# Patient Record
Sex: Female | Born: 1945
Health system: Southern US, Community
[De-identification: ages and names within clinical notes are randomized; demographics above are authoritative.]

## PROBLEM LIST (undated history)

## (undated) DIAGNOSIS — M419 Scoliosis, unspecified: Secondary | ICD-10-CM

## (undated) DIAGNOSIS — E119 Type 2 diabetes mellitus without complications: Secondary | ICD-10-CM

## (undated) DIAGNOSIS — N809 Endometriosis, unspecified: Secondary | ICD-10-CM

## (undated) DIAGNOSIS — I10 Essential (primary) hypertension: Secondary | ICD-10-CM

## (undated) DIAGNOSIS — Z794 Long term (current) use of insulin: Secondary | ICD-10-CM

## (undated) DIAGNOSIS — N189 Chronic kidney disease, unspecified: Secondary | ICD-10-CM

## (undated) DIAGNOSIS — K219 Gastro-esophageal reflux disease without esophagitis: Secondary | ICD-10-CM

## (undated) DIAGNOSIS — B1920 Unspecified viral hepatitis C without hepatic coma: Secondary | ICD-10-CM

## (undated) DIAGNOSIS — F32A Depression, unspecified: Secondary | ICD-10-CM

## (undated) DIAGNOSIS — R0600 Dyspnea, unspecified: Secondary | ICD-10-CM

## (undated) DIAGNOSIS — M1990 Unspecified osteoarthritis, unspecified site: Secondary | ICD-10-CM

## (undated) DIAGNOSIS — Z6841 Body Mass Index (BMI) 40.0 and over, adult: Secondary | ICD-10-CM

## (undated) DIAGNOSIS — E039 Hypothyroidism, unspecified: Secondary | ICD-10-CM

## (undated) HISTORY — DX: Morbid (severe) obesity due to excess calories: E66.01

## (undated) HISTORY — DX: Body Mass Index (BMI) 40.0 and over, adult: Z684

## (undated) HISTORY — PX: DIAGNOSTIC LAPAROSCOPY: SUR761

## (undated) HISTORY — PX: ABDOMINAL HYSTERECTOMY: SHX81

## (undated) HISTORY — PX: DILATION AND CURETTAGE OF UTERUS: SHX78

## (undated) HISTORY — DX: Type 2 diabetes mellitus without complications: Z79.4

## (undated) HISTORY — DX: Type 2 diabetes mellitus without complications: E11.9

## (undated) HISTORY — DX: Essential (primary) hypertension: I10

## (undated) HISTORY — PX: CHOLECYSTECTOMY: SHX55

## (undated) HISTORY — PX: LAPAROSCOPIC GASTRIC BANDING: SHX1100

## (undated) HISTORY — PX: EYE SURGERY: SHX253

## (undated) HISTORY — PX: JOINT REPLACEMENT: SHX530

## (undated) HISTORY — PX: TONSILLECTOMY: SUR1361

---

## 2011-07-28 ENCOUNTER — Ambulatory Visit: Payer: Self-pay | Admitting: Family Medicine

## 2011-12-25 ENCOUNTER — Encounter: Payer: Self-pay | Admitting: Obstetrics and Gynecology

## 2012-02-28 ENCOUNTER — Emergency Department (HOSPITAL_COMMUNITY)
Admission: EM | Admit: 2012-02-28 | Discharge: 2012-02-28 | Disposition: A | Discharge status: Home / Self Care | Payer: Managed Care, Other (non HMO) | Attending: Emergency Medicine | Admitting: Emergency Medicine

## 2012-02-28 ENCOUNTER — Encounter (HOSPITAL_COMMUNITY): Payer: Self-pay | Admitting: Emergency Medicine

## 2012-02-28 ENCOUNTER — Emergency Department (HOSPITAL_COMMUNITY): Payer: Managed Care, Other (non HMO)

## 2012-02-28 DIAGNOSIS — M542 Cervicalgia: Secondary | ICD-10-CM | POA: Insufficient documentation

## 2012-02-28 DIAGNOSIS — Z794 Long term (current) use of insulin: Secondary | ICD-10-CM | POA: Insufficient documentation

## 2012-02-28 DIAGNOSIS — Z791 Long term (current) use of non-steroidal anti-inflammatories (NSAID): Secondary | ICD-10-CM | POA: Insufficient documentation

## 2012-02-28 DIAGNOSIS — E119 Type 2 diabetes mellitus without complications: Secondary | ICD-10-CM | POA: Insufficient documentation

## 2012-02-28 DIAGNOSIS — Z79899 Other long term (current) drug therapy: Secondary | ICD-10-CM | POA: Insufficient documentation

## 2012-02-28 DIAGNOSIS — M129 Arthropathy, unspecified: Secondary | ICD-10-CM | POA: Insufficient documentation

## 2012-02-28 DIAGNOSIS — M5412 Radiculopathy, cervical region: Secondary | ICD-10-CM

## 2012-02-28 DIAGNOSIS — I10 Essential (primary) hypertension: Secondary | ICD-10-CM | POA: Insufficient documentation

## 2012-02-28 HISTORY — DX: Unspecified osteoarthritis, unspecified site: M19.90

## 2012-02-28 MED ORDER — PREDNISONE 10 MG PO TABS: ORAL_TABLET | ORAL | Status: DC

## 2012-02-28 MED ORDER — HYDROCODONE-ACETAMINOPHEN 5-325 MG PO TABS: 1.0000 | ORAL_TABLET | ORAL | Status: DC | PRN

## 2012-02-28 MED ORDER — HYDROCODONE-ACETAMINOPHEN 5-325 MG PO TABS
1.0000 | ORAL_TABLET | Freq: Once | ORAL | Status: AC
  Administered 2012-02-28: 1 via ORAL
  Filled 2012-02-28: qty 1

## 2012-02-28 NOTE — ED Notes (Signed)
Patient advises that the pian is in the back of her head and radiates into the neck shoulder and down into the upper arm.

## 2012-02-28 NOTE — ED Notes (Signed)
Complaints of headache pain,radiates into the neck and the left arm. Sharp in nature, pain times two days. History of the same approximately 6 weeks ago.

## 2012-02-28 NOTE — ED Notes (Signed)
Patient transported to CT 

## 2012-02-28 NOTE — ED Notes (Signed)
Patient discharged with instructions using the teach back method she verbalizes an understanding.

## 2012-02-28 NOTE — ED Provider Notes (Signed)
History     CSN: [id number]  Arrival date & time 02/28/12  1308   First MD Initiated Contact with Patient 02/28/12 [phone]      Chief Complaint  Patient presents with  . Headache    (Consider location/radiation/quality/duration/timing/severity/associated sxs/prior treatment) HPI Comments: The patient is a 67 year old woman who says that for about 6 weeks she's been having pain in the left occipital region that goes into her left neck and left shoulder and left upper arm. She saw her orthopedist who x-rayed her left shoulder and didn't find anything. She has been taking Aleve, without relief. In the past couple of nights the pain has been severe enough that she cried. She therefore sought evaluation. There is no history of injury to her head or neck.  Patient is a 67 y.o. female presenting with headaches. The history is provided by the patient.  Headache  This is a new problem. The current episode started more than 1 week ago (Onset 6 weeks ago.). Episode frequency: Intermittent pain that originates in the left occipital region. The problem has been gradually worsening. The headache is associated with nothing. The pain is located in the left unilateral and occipital region. The quality of the pain is described as sharp. The pain is at a severity of 8/10. The pain is severe. The pain radiates to the left arm, left shoulder and left neck. She has tried NSAIDs for the symptoms. The treatment provided no relief.    Past Medical History  Diagnosis Date  . Arthritis   . Diabetes mellitus without complication   . Hypertension     Past Surgical History  Procedure Date  . Tonsillectomy   . Joint replacement   . Abdominal hysterectomy     No family history on file.  History  Substance Use Topics  . Smoking status: Not on file  . Smokeless tobacco: Not on file  . Alcohol Use:     OB History    Grav Para Term Preterm Abortions TAB SAB Ect Mult Living                  Review of  Systems  HENT: Positive for neck pain.   Eyes: Negative.   Respiratory: Negative.   Cardiovascular: Negative.   Gastrointestinal: Negative.   Genitourinary: Negative.   Musculoskeletal:       Arthritis in hips requiring steroid injections in the past.  Skin: Negative.   Neurological: Positive for headaches.  Psychiatric/Behavioral: Negative.     Allergies  Review of patient's allergies indicates no known allergies.  Home Medications   Current Outpatient Rx  Name  Route  Sig  Dispense  Refill  . ATENOLOL 50 MG PO TABS   Oral   Take 50 mg by mouth daily.         . INSULIN ASPART PROT & ASPART (70-30) 100 UNIT/ML [place] SUSP   Subcutaneous   Inject 40 Units into the skin 2 (two) times daily with a meal.         . MULTI-VITAMIN/MINERALS PO TABS   Oral   Take 1 tablet by mouth daily.         [name] NAPROXEN SODIUM 220 MG PO TABS   Oral   Take 220 mg by mouth 2 (two) times daily with a meal.         . SITAGLIPTIN PHOSPHATE 25 MG PO TABS   Oral   Take 25 mg by mouth daily.         [name]  VALSARTAN 160 MG PO TABS   Oral   Take 160 mg by mouth daily.           BP 181/70  Pulse 57  Temp 98.4 F (36.9 C) (Oral)  Resp 14  SpO2 98%  Physical Exam  Nursing note and vitals reviewed. Constitutional: She is oriented to person, place, and time.       Obese elderly lady in moderate distress with pain originates in her left occipital region and radiates into her left shoulder and left upper arm.  HENT:  Head: Normocephalic and atraumatic.  Right Ear: External ear normal.  Left Ear: External ear normal.  Mouth/Throat: Oropharynx is clear and moist.  Eyes: Conjunctivae normal and EOM are normal. Pupils are equal, round, and reactive to light.  Neck: Normal range of motion. Neck supple.  Cardiovascular: Normal rate, regular rhythm and normal heart sounds.   Pulmonary/Chest: Effort normal and breath sounds normal.  Abdominal: Soft. Bowel sounds are normal.  Musculoskeletal:  Normal range of motion.  Neurological: She is alert and oriented to person, place, and time.       No sensory or motor deficit.  Skin: Skin is warm and dry.  Psychiatric: She has a normal mood and affect. Her behavior is normal.    ED Course  Procedures (including critical care time)  9:38 AM Pt seen --> physical exam performed.  PO pain medicine ordered.  CT of head and cervical spine ordered.  12:15 PM No results found for this or any previous visit. Ct Head Wo Contrast  02/28/2012  *RADIOLOGY REPORT*  Clinical Data:  Left occipital headache for 6 weeks, pain radiating to the left neck and left arm  CT HEAD WITHOUT CONTRAST CT CERVICAL SPINE WITHOUT CONTRAST  Technique:  Multidetector CT imaging of the head and cervical spine was performed following the standard protocol without intravenous contrast.  Multiplanar CT image reconstructions of the cervical spine were also generated.  Comparison:   None  CT HEAD  Findings: No acute hemorrhage, acute infarction, or mass lesion is identified.  No midline shift.  No ventriculomegaly.  No skull fracture.  Orbits and paranasal sinuses are intact.  IMPRESSION: No acute intracranial finding.  CT CERVICAL SPINE  Findings: C1 through the cervical thoracic junction is visualized in its entirety.  Mild reversal of the normal cervical lordosis is noted centered at C5-C6.  Mild disc degenerative changes are noted at that level.  No narrowing of the neural foramina is identified. Mild multilevel mid/inferior cervical spine facet osteoarthritic change, right greater than left.  Vertebral body heights are preserved.  No fracture or dislocation identified.  IMPRESSION: No acute osseous abnormality.  Mild disc degenerative change at C5- C6.   Original Report Authenticated By: Christiana Pellant, M.D.    Ct Cervical Spine Wo Contrast  02/28/2012  *RADIOLOGY REPORT*  Clinical Data:  Left occipital headache for 6 weeks, pain radiating to the left neck and left arm  CT HEAD  WITHOUT CONTRAST CT CERVICAL SPINE WITHOUT CONTRAST  Technique:  Multidetector CT imaging of the head and cervical spine was performed following the standard protocol without intravenous contrast.  Multiplanar CT image reconstructions of the cervical spine were also generated.  Comparison:   None  CT HEAD  Findings: No acute hemorrhage, acute infarction, or mass lesion is identified.  No midline shift.  No ventriculomegaly.  No skull fracture.  Orbits and paranasal sinuses are intact.  IMPRESSION: No acute intracranial finding.  CT CERVICAL SPINE  Findings:  C1 through the cervical thoracic junction is visualized in its entirety.  Mild reversal of the normal cervical lordosis is noted centered at C5-C6.  Mild disc degenerative changes are noted at that level.  No narrowing of the neural foramina is identified. Mild multilevel mid/inferior cervical spine facet osteoarthritic change, right greater than left.  Vertebral body heights are preserved.  No fracture or dislocation identified.  IMPRESSION: No acute osseous abnormality.  Mild disc degenerative change at C5- C6.   Original Report Authenticated By: Christiana Pellant, M.D.     CT of C-spine shows mild cervical disc disease.  Will treat for cervical radiculopathy with prednisone taper and hydrocodone-acetaminophen.  Advised to keep close check on her diabetes, as the prednisone could adversely affect her blood glucose levels.   1. Cervical radiculopathy           Carleene Cooper III, MD 02/28/12 [phone]

## 2012-03-08 ENCOUNTER — Other Ambulatory Visit (HOSPITAL_COMMUNITY): Payer: Self-pay | Admitting: Internal Medicine

## 2012-03-08 DIAGNOSIS — Z1231 Encounter for screening mammogram for malignant neoplasm of breast: Secondary | ICD-10-CM

## 2012-03-18 ENCOUNTER — Ambulatory Visit (HOSPITAL_COMMUNITY): Payer: Managed Care, Other (non HMO)

## 2012-03-23 ENCOUNTER — Ambulatory Visit (HOSPITAL_COMMUNITY)
Admission: RE | Admit: 2012-03-23 | Discharge: 2012-03-23 | Disposition: A | Discharge status: Home / Self Care | Payer: Managed Care, Other (non HMO) | Source: Ambulatory Visit | Attending: Internal Medicine | Admitting: Internal Medicine

## 2012-03-23 DIAGNOSIS — Z1231 Encounter for screening mammogram for malignant neoplasm of breast: Secondary | ICD-10-CM | POA: Insufficient documentation

## 2012-05-01 ENCOUNTER — Encounter (HOSPITAL_COMMUNITY): Payer: Self-pay | Admitting: Emergency Medicine

## 2012-05-01 ENCOUNTER — Emergency Department (HOSPITAL_COMMUNITY)
Admission: EM | Admit: 2012-05-01 | Discharge: 2012-05-01 | Disposition: A | Discharge status: Home / Self Care | Payer: Medicare HMO | Attending: Emergency Medicine | Admitting: Emergency Medicine

## 2012-05-01 ENCOUNTER — Emergency Department (HOSPITAL_COMMUNITY): Payer: Medicare HMO

## 2012-05-01 DIAGNOSIS — Y9389 Activity, other specified: Secondary | ICD-10-CM | POA: Insufficient documentation

## 2012-05-01 DIAGNOSIS — R51 Headache: Secondary | ICD-10-CM | POA: Insufficient documentation

## 2012-05-01 DIAGNOSIS — R0602 Shortness of breath: Secondary | ICD-10-CM | POA: Insufficient documentation

## 2012-05-01 DIAGNOSIS — I10 Essential (primary) hypertension: Secondary | ICD-10-CM | POA: Insufficient documentation

## 2012-05-01 DIAGNOSIS — Z79899 Other long term (current) drug therapy: Secondary | ICD-10-CM | POA: Insufficient documentation

## 2012-05-01 DIAGNOSIS — T59891A Toxic effect of other specified gases, fumes and vapors, accidental (unintentional), initial encounter: Secondary | ICD-10-CM | POA: Insufficient documentation

## 2012-05-01 DIAGNOSIS — R42 Dizziness and giddiness: Secondary | ICD-10-CM | POA: Insufficient documentation

## 2012-05-01 DIAGNOSIS — E1169 Type 2 diabetes mellitus with other specified complication: Secondary | ICD-10-CM | POA: Insufficient documentation

## 2012-05-01 DIAGNOSIS — E162 Hypoglycemia, unspecified: Secondary | ICD-10-CM

## 2012-05-01 DIAGNOSIS — Z7729 Contact with and (suspected ) exposure to other hazardous substances: Secondary | ICD-10-CM

## 2012-05-01 DIAGNOSIS — T5894XA Toxic effect of carbon monoxide from unspecified source, undetermined, initial encounter: Secondary | ICD-10-CM | POA: Insufficient documentation

## 2012-05-01 DIAGNOSIS — Y92009 Unspecified place in unspecified non-institutional (private) residence as the place of occurrence of the external cause: Secondary | ICD-10-CM | POA: Insufficient documentation

## 2012-05-01 DIAGNOSIS — R110 Nausea: Secondary | ICD-10-CM | POA: Insufficient documentation

## 2012-05-01 DIAGNOSIS — Z794 Long term (current) use of insulin: Secondary | ICD-10-CM | POA: Insufficient documentation

## 2012-05-01 LAB — TROPONIN I: Troponin I: <0.3 ng/mL (ref <0.30)

## 2012-05-01 LAB — COMPREHENSIVE METABOLIC PANEL
ALT: 27 U/L (ref 0–35)
AST: 40 U/L — ABNORMAL HIGH (ref 0–37)
Alkaline Phosphatase: 50 U/L (ref 39–117)
CO2: 26 mEq/L (ref 19–32)
Calcium: 9.4 mg/dL (ref 8.4–10.5)
Chloride: 104 mEq/L (ref 96–112)
GFR calc Af Amer: 57 mL/min — ABNORMAL LOW (ref >90)
GFR calc non Af Amer: 49 mL/min — ABNORMAL LOW (ref >90)
Glucose, Bld: 55 mg/dL — ABNORMAL LOW (ref 70–99)
Sodium: 140 mEq/L (ref 135–145)
Total Bilirubin: 0.3 mg/dL (ref 0.3–1.2)

## 2012-05-01 LAB — CBC WITH DIFFERENTIAL/PLATELET
Basophils Absolute: 0.1 10*3/uL (ref 0.0–0.1)
Basophils Relative: 1 % (ref 0–1)
Eosinophils Absolute: 0.3 10*3/uL (ref 0.0–0.7)
Hemoglobin: 12.5 g/dL (ref 12.0–15.0)
MCH: 29 pg (ref 26.0–34.0)
MCHC: 34.1 g/dL (ref 30.0–36.0)
Monocytes Relative: 9 % (ref 3–12)
Neutro Abs: 6.4 10*3/uL (ref 1.7–7.7)
Neutrophils Relative %: 61 % (ref 43–77)
Platelets: 177 10*3/uL (ref 150–400)
RDW: 15.2 % (ref 11.5–15.5)

## 2012-05-01 LAB — GLUCOSE, CAPILLARY
Glucose-Capillary: 65 mg/dL — ABNORMAL LOW (ref 70–99)
Glucose-Capillary: 164 mg/dL — ABNORMAL HIGH (ref 70–99)

## 2012-05-01 LAB — BLOOD GAS, ARTERIAL
Acid-Base Excess: 1 mmol/L (ref 0.0–2.0)
Bicarbonate: 25 mEq/L — ABNORMAL HIGH (ref 20.0–24.0)
O2 Saturation: 99.2 %
pCO2 arterial: 39.6 mmHg (ref 35.0–45.0)
pO2, Arterial: 341 mmHg — ABNORMAL HIGH (ref 80.0–100.0)

## 2012-05-01 LAB — CARBOXYHEMOGLOBIN
Methemoglobin: 1.6 % — ABNORMAL HIGH (ref 0.0–1.5)
Total hemoglobin: 13.2 g/dL (ref 12.0–16.0)

## 2012-05-01 LAB — LACTIC ACID, PLASMA: Lactic Acid, Venous: 2 mmol/L (ref 0.5–2.2)

## 2012-05-01 MED ORDER — IBUPROFEN 200 MG PO TABS
400.0000 mg | ORAL_TABLET | Freq: Once | ORAL | Status: AC
  Administered 2012-05-01: 400 mg via ORAL
  Filled 2012-05-01: qty 2

## 2012-05-01 MED ORDER — DEXTROSE 50 % IV SOLN
50.0000 mL | Freq: Once | INTRAVENOUS | Status: AC
  Administered 2012-05-01: 50 mL via INTRAVENOUS
  Filled 2012-05-01: qty 50
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
## 2012-05-01 NOTE — ED Notes (Signed)
MD at bedside. 
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
## 2012-05-01 NOTE — ED Notes (Signed)
[id number]<AV> Expected date:05/01/12<BR> Expected time: 9:41 AM<BR> Means of arrival:Ambulance<BR> Comments:<BR> Headache
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
## 2012-05-12 ENCOUNTER — Other Ambulatory Visit: Payer: Self-pay | Admitting: Physician Assistant

## 2012-05-12 DIAGNOSIS — B1920 Unspecified viral hepatitis C without hepatic coma: Secondary | ICD-10-CM

## 2012-05-19 ENCOUNTER — Ambulatory Visit
Admission: RE | Admit: 2012-05-19 | Discharge: 2012-05-19 | Disposition: A | Discharge status: Home / Self Care | Payer: Medicare HMO | Source: Ambulatory Visit | Attending: Physician Assistant | Admitting: Physician Assistant

## 2012-05-19 DIAGNOSIS — B1920 Unspecified viral hepatitis C without hepatic coma: Secondary | ICD-10-CM

## 2012-05-25 ENCOUNTER — Other Ambulatory Visit: Payer: Self-pay | Admitting: Physician Assistant

## 2012-05-25 DIAGNOSIS — K7460 Unspecified cirrhosis of liver: Secondary | ICD-10-CM

## 2012-05-25 DIAGNOSIS — C220 Liver cell carcinoma: Secondary | ICD-10-CM

## 2012-06-02 ENCOUNTER — Ambulatory Visit
Admission: RE | Admit: 2012-06-02 | Discharge: 2012-06-02 | Disposition: A | Discharge status: Home / Self Care | Payer: Medicare HMO | Source: Ambulatory Visit | Attending: Physician Assistant | Admitting: Physician Assistant

## 2012-06-02 DIAGNOSIS — C220 Liver cell carcinoma: Secondary | ICD-10-CM

## 2012-06-02 DIAGNOSIS — K7460 Unspecified cirrhosis of liver: Secondary | ICD-10-CM

## 2012-06-02 MED ORDER — IOHEXOL 300 MG/ML  SOLN
125.0000 mL | Freq: Once | INTRAMUSCULAR | Status: AC | PRN
  Administered 2012-06-02: 125 mL via INTRAVENOUS

## 2012-06-15 ENCOUNTER — Other Ambulatory Visit: Payer: Self-pay | Admitting: Internal Medicine

## 2012-06-15 DIAGNOSIS — K769 Liver disease, unspecified: Secondary | ICD-10-CM

## 2012-07-01 ENCOUNTER — Ambulatory Visit
Admission: RE | Admit: 2012-07-01 | Discharge: 2012-07-01 | Disposition: A | Discharge status: Home / Self Care | Payer: Medicare HMO | Source: Ambulatory Visit | Attending: Internal Medicine | Admitting: Internal Medicine

## 2012-07-01 DIAGNOSIS — K769 Liver disease, unspecified: Secondary | ICD-10-CM

## 2012-07-01 MED ORDER — GADOXETATE DISODIUM 0.25 MMOL/ML IV SOLN
10.0000 mL | Freq: Once | INTRAVENOUS | Status: AC | PRN
  Administered 2012-07-01: 10 mL via INTRAVENOUS

## 2012-07-02 ENCOUNTER — Other Ambulatory Visit: Payer: Medicare HMO

## 2012-09-30 ENCOUNTER — Other Ambulatory Visit: Payer: Self-pay | Admitting: Internal Medicine

## 2012-09-30 DIAGNOSIS — K769 Liver disease, unspecified: Secondary | ICD-10-CM

## 2012-10-14 ENCOUNTER — Emergency Department (HOSPITAL_COMMUNITY)
Admission: EM | Admit: 2012-10-14 | Discharge: 2012-10-14 | Disposition: A | Discharge status: Home / Self Care | Payer: Medicare HMO | Attending: Emergency Medicine | Admitting: Emergency Medicine

## 2012-10-14 ENCOUNTER — Encounter (HOSPITAL_COMMUNITY): Payer: Self-pay | Admitting: Emergency Medicine

## 2012-10-14 DIAGNOSIS — Z791 Long term (current) use of non-steroidal anti-inflammatories (NSAID): Secondary | ICD-10-CM | POA: Insufficient documentation

## 2012-10-14 DIAGNOSIS — E119 Type 2 diabetes mellitus without complications: Secondary | ICD-10-CM | POA: Insufficient documentation

## 2012-10-14 DIAGNOSIS — Z79899 Other long term (current) drug therapy: Secondary | ICD-10-CM | POA: Insufficient documentation

## 2012-10-14 DIAGNOSIS — N390 Urinary tract infection, site not specified: Secondary | ICD-10-CM | POA: Insufficient documentation

## 2012-10-14 DIAGNOSIS — Z794 Long term (current) use of insulin: Secondary | ICD-10-CM | POA: Insufficient documentation

## 2012-10-14 DIAGNOSIS — R5381 Other malaise: Secondary | ICD-10-CM | POA: Insufficient documentation

## 2012-10-14 DIAGNOSIS — R5383 Other fatigue: Secondary | ICD-10-CM | POA: Insufficient documentation

## 2012-10-14 DIAGNOSIS — I10 Essential (primary) hypertension: Secondary | ICD-10-CM | POA: Insufficient documentation

## 2012-10-14 DIAGNOSIS — M129 Arthropathy, unspecified: Secondary | ICD-10-CM | POA: Insufficient documentation

## 2012-10-14 DIAGNOSIS — IMO0001 Reserved for inherently not codable concepts without codable children: Secondary | ICD-10-CM | POA: Insufficient documentation

## 2012-10-14 HISTORY — DX: Unspecified viral hepatitis C without hepatic coma: B19.20

## 2012-10-14 LAB — CBC WITH DIFFERENTIAL/PLATELET
HCT: 36.5 % (ref 36.0–46.0)
Hemoglobin: 12.5 g/dL (ref 12.0–15.0)
Lymphocytes Relative: 30 % (ref 12–46)
MCHC: 34.2 g/dL (ref 30.0–36.0)
Monocytes Absolute: 0.5 10*3/uL (ref 0.1–1.0)
Monocytes Relative: 8 % (ref 3–12)
Neutro Abs: 3.8 10*3/uL (ref 1.7–7.7)
WBC: 6.6 10*3/uL (ref 4.0–10.5)

## 2012-10-14 LAB — COMPREHENSIVE METABOLIC PANEL
BUN: 26 mg/dL — ABNORMAL HIGH (ref 6–23)
CO2: 23 mEq/L (ref 19–32)
Chloride: 103 mEq/L (ref 96–112)
Creatinine, Ser: 1.04 mg/dL (ref 0.50–1.10)
GFR calc non Af Amer: 54 mL/min — ABNORMAL LOW (ref >90)
Total Bilirubin: 0.5 mg/dL (ref 0.3–1.2)

## 2012-10-14 LAB — URINE MICROSCOPIC-ADD ON

## 2012-10-14 LAB — URINALYSIS, ROUTINE W REFLEX MICROSCOPIC
Protein, ur: NEGATIVE mg/dL
Urobilinogen, UA: 0.2 mg/dL (ref 0.0–1.0)

## 2012-10-14 MED ORDER — NITROFURANTOIN MONOHYD MACRO 100 MG PO CAPS: 100.0000 mg | ORAL_CAPSULE | Freq: Two times a day (BID) | ORAL | Status: DC

## 2012-10-14 MED ORDER — SODIUM CHLORIDE 0.9 % IV BOLUS (SEPSIS)
1000.0000 mL | Freq: Once | INTRAVENOUS | Status: AC
  Administered 2012-10-14: 1000 mL via INTRAVENOUS

## 2012-10-14 NOTE — ED Provider Notes (Signed)
CSN: [id number]     Arrival date & time 10/14/12  0827 History     First MD Initiated Contact with Patient 10/14/12 [phone]     Chief Complaint  Patient presents with  . Influenza   (Consider location/radiation/quality/duration/timing/severity/associated sxs/prior Treatment) HPI Comments: Patient had 3-4 episodes of vomiting on Sunday that resolved Sunday evening. On Monday she thinks she felt okay but yesterday had diffuse myalgias, elevated blood sugar at 200 despite following a strict diabetic diet and dark urine. She drank lots of fluids and rested and feels a bit better today but is still having some body aches. She recently has had contact with multiple people who have had the flu and she just wanted to be careful and be checked. She denies any further vomiting or nausea. She is still taking all of her diabetic diet medications appropriately but when woke up this morning her blood sugar was 300.  Patient is a 67 y.o. female presenting with flu symptoms. The history is provided by the patient.  Influenza Presenting symptoms: fatigue, myalgias and vomiting   Severity:  Moderate Onset quality:  Gradual Duration:  4 days Progression:  Improving Chronicity:  New Relieved by:  Drinking and rest Worsened by:  Nothing tried Ineffective treatments:  None tried Associated symptoms: no chills, no decreased appetite, no decrease in physical activity, no mental status change, no congestion and no neck stiffness   Associated symptoms comment:  Intermittent loose stool and constipation.   Past Medical History  Diagnosis Date  . Arthritis   . Diabetes mellitus without complication   . Hypertension    Past Surgical History  Procedure Laterality Date  . Tonsillectomy    . Joint replacement    . Abdominal hysterectomy    . Cholecystectomy     History reviewed. No pertinent family history. History  Substance Use Topics  . Smoking status: Never Smoker   . Smokeless tobacco: Not on file  .  Alcohol Use: No   OB History   Grav Para Term Preterm Abortions TAB SAB Ect Mult Living                 Review of Systems  Constitutional: Positive for fatigue. Negative for chills and decreased appetite.  HENT: Negative for congestion and neck stiffness.   Gastrointestinal: Positive for vomiting.  Musculoskeletal: Positive for myalgias.  All other systems reviewed and are negative.    Allergies  Review of patient's allergies indicates no known allergies.  Home Medications   Current Outpatient Rx  Name  Route  Sig  Dispense  Refill  . bisacodyl (DULCOLAX) 5 MG EC tablet   Oral   Take 5 mg by mouth once as needed for constipation.         . naproxen sodium (ANAPROX) 220 MG tablet   Oral   Take 220 mg by mouth 2 (two) times daily with a meal.         . atenolol (TENORMIN) 50 MG tablet   Oral   Take 50 mg by mouth daily.         . insulin aspart protamine-insulin aspart (NOVOLOG 70/30) (70-30) 100 UNIT/ML injection   Subcutaneous   Inject 40 Units into the skin 2 (two) times daily with a meal.         . Multiple Vitamins-Minerals (MULTIVITAMIN WITH MINERALS) tablet   Oral   Take 1 tablet by mouth daily.         . sitaGLIPtin (JANUVIA) 25 MG tablet  Oral   Take 25 mg by mouth daily.         . valsartan (DIOVAN) 160 MG tablet   Oral   Take 160 mg by mouth daily.          BP 180/60  Pulse 52  SpO2 100% Physical Exam  Nursing note and vitals reviewed. Constitutional: She is oriented to person, place, and time. She appears well-developed and well-nourished. No distress.  HENT:  Head: Normocephalic and atraumatic.  Eyes: EOM are normal. Pupils are equal, round, and reactive to light.  Cardiovascular: Normal rate, regular rhythm, normal heart sounds and intact distal pulses.  Exam reveals no friction rub.   No murmur heard. Pulmonary/Chest: Effort normal and breath sounds normal. She has no wheezes. She has no rales.  Abdominal: Soft. Bowel sounds  are normal. She exhibits no distension. There is no tenderness. There is no rebound and no guarding.  Musculoskeletal: Normal range of motion. She exhibits no tenderness.  No edema  Neurological: She is alert and oriented to person, place, and time. No cranial nerve deficit.  Skin: Skin is warm and dry. No rash noted.  Psychiatric: She has a normal mood and affect. Her behavior is normal.    ED Course   Procedures (including critical care time)  Labs Reviewed  CBC WITH DIFFERENTIAL - Abnormal; Notable for the following:    Platelets 147 (*)    All other components within normal limits  COMPREHENSIVE METABOLIC PANEL - Abnormal; Notable for the following:    Glucose, Bld 282 (*)    BUN 26 (*)    Albumin 3.1 (*)    GFR calc non Af Amer 54 (*)    GFR calc Af Amer 63 (*)    All other components within normal limits  URINALYSIS, ROUTINE W REFLEX MICROSCOPIC - Abnormal; Notable for the following:    APPearance CLOUDY (*)    Glucose, UA 250 (*)    Hgb urine dipstick SMALL (*)    Nitrite POSITIVE (*)    Leukocytes, UA LARGE (*)    All other components within normal limits  URINE MICROSCOPIC-ADD ON - Abnormal; Notable for the following:    Squamous Epithelial / LPF FEW (*)    Bacteria, UA MANY (*)    All other components within normal limits  URINE CULTURE   No results found. 1. UTI (lower urinary tract infection)     MDM   Patient presenting with myalgias and elevated blood sugar for the last 2 days. Prior to that she had 12 hours of illness that resulted in 4 episodes of vomiting. She denies any recent medication changes and has been taking her diabetic meds appropriately. Also has recently had contact with others who have had similar symptoms.  She is feeling better today but wanted to be evaluated to ensure there was no other underlying cause for her elevated blood sugar. She has not had a flu shot this year yet.  She has normal vital signs and is well appearing on exam. There are  no focal signs of abnormalities. She denies any URI symptoms. Will ensure that patient does not have a urinary tract infection causing her symptoms. She denies any abdominal pain and has no signs of fluid overload. We'll treat her hyperglycemia as well.  CBC, CMP, UA pending and patient given 1 L bolus of fluid.  10:57 AM Pt has evidence of hyperglycemia which is most likely related to UTI today.  Will treat with abx and d/c home  as she is not displaying any signs of pyelo or AMS and tolerating po's.  Gwyneth Sprout, MD 10/14/12 1104

## 2012-10-16 ENCOUNTER — Telehealth (HOSPITAL_COMMUNITY): Payer: Self-pay | Admitting: Emergency Medicine

## 2012-10-16 LAB — URINE CULTURE

## 2012-10-16 NOTE — ED Notes (Signed)
Post ED Visit - Positive Culture Follow-up: Successful Patient Follow-Up  Culture assessed and recommendations reviewed by: []  Wes Dulaney, Pharm.D., BCPS []  Celedonio Miyamoto, Pharm.D., BCPS [x]  Georgina Pillion, Pharm.D., BCPS []  [place], [address].D., BCPS, AAHIVP []  Estella Husk, Pharm.D., BCPS, AAHIVP  Positive urine culture  []  Patient discharged without antimicrobial prescription and treatment is now indicated [x]  Organism is resistant to prescribed ED discharge antimicrobial []  Patient with positive blood cultures  Changes discussed with ED provider: Antony Madura PA-C New antibiotic prescription: Keflex 500 mg bid x 7 days    Teresa Golden 10/16/2012, 5:03 PM

## 2012-10-16 NOTE — Progress Notes (Signed)
ED Antimicrobial Stewardship Positive Culture Follow Up   Teresa Golden is an 66 y.o. female who presented to [facility] on 10/14/2012 with a chief complaint of N/V. Myalgia, hyperglycemia  Chief Complaint  Patient presents with  . Influenza    Recent Results (from the past 720 hour(s))  URINE CULTURE     Status: None   Collection Time    10/14/12 10:09 AM      Result Value Range Status   Specimen Description URINE, CLEAN CATCH   Final   Special Requests NONE   Final   Culture  Setup Time     Final   Value: 10/14/2012 15:00     Performed at [facility] Count     Final   Value: >=100,000 COLONIES/ML     Performed at [facility]   Culture     Final   Value: KLEBSIELLA PNEUMONIAE     Performed at [facility]   Report Status 10/16/2012 FINAL   Final   Organism ID, Bacteria KLEBSIELLA PNEUMONIAE   Final    [x]  Treated with Macrobid, organism intermediate to prescribed antimicrobial  67 y.o. F found to have a dirty UA and subsequent UTI upon work-up for N/V, myalgia and flu-like symptoms. The culture was intermediate to macrobid -- this coupled with the patient's reduced renal function due to advanced age requires a different antibiotic to treat this UTI.   New antibiotic prescription: Keflex 500 mg bid x 7 days  ED Provider: Antony Madura, PA-C  Teresa Golden 10/16/2012, 2:59 PM Infectious Diseases Pharmacist Phone# [phone]

## 2012-10-17 ENCOUNTER — Emergency Department (HOSPITAL_COMMUNITY): Payer: Medicare HMO

## 2012-10-17 ENCOUNTER — Emergency Department (HOSPITAL_COMMUNITY)
Admission: EM | Admit: 2012-10-17 | Discharge: 2012-10-17 | Disposition: A | Discharge status: Home / Self Care | Payer: Medicare HMO | Attending: Emergency Medicine | Admitting: Emergency Medicine

## 2012-10-17 ENCOUNTER — Encounter (HOSPITAL_COMMUNITY): Payer: Self-pay | Admitting: Emergency Medicine

## 2012-10-17 DIAGNOSIS — R0602 Shortness of breath: Secondary | ICD-10-CM | POA: Insufficient documentation

## 2012-10-17 DIAGNOSIS — Z9089 Acquired absence of other organs: Secondary | ICD-10-CM | POA: Insufficient documentation

## 2012-10-17 DIAGNOSIS — R52 Pain, unspecified: Secondary | ICD-10-CM | POA: Insufficient documentation

## 2012-10-17 DIAGNOSIS — R0989 Other specified symptoms and signs involving the circulatory and respiratory systems: Secondary | ICD-10-CM | POA: Insufficient documentation

## 2012-10-17 DIAGNOSIS — R10811 Right upper quadrant abdominal tenderness: Secondary | ICD-10-CM | POA: Insufficient documentation

## 2012-10-17 DIAGNOSIS — Z79899 Other long term (current) drug therapy: Secondary | ICD-10-CM | POA: Insufficient documentation

## 2012-10-17 DIAGNOSIS — E119 Type 2 diabetes mellitus without complications: Secondary | ICD-10-CM | POA: Insufficient documentation

## 2012-10-17 DIAGNOSIS — R110 Nausea: Secondary | ICD-10-CM | POA: Insufficient documentation

## 2012-10-17 DIAGNOSIS — I10 Essential (primary) hypertension: Secondary | ICD-10-CM | POA: Insufficient documentation

## 2012-10-17 DIAGNOSIS — R509 Fever, unspecified: Secondary | ICD-10-CM | POA: Insufficient documentation

## 2012-10-17 DIAGNOSIS — Z8619 Personal history of other infectious and parasitic diseases: Secondary | ICD-10-CM | POA: Insufficient documentation

## 2012-10-17 DIAGNOSIS — R0609 Other forms of dyspnea: Secondary | ICD-10-CM | POA: Insufficient documentation

## 2012-10-17 DIAGNOSIS — N390 Urinary tract infection, site not specified: Secondary | ICD-10-CM | POA: Insufficient documentation

## 2012-10-17 DIAGNOSIS — Z90710 Acquired absence of both cervix and uterus: Secondary | ICD-10-CM | POA: Insufficient documentation

## 2012-10-17 DIAGNOSIS — M129 Arthropathy, unspecified: Secondary | ICD-10-CM | POA: Insufficient documentation

## 2012-10-17 DIAGNOSIS — Z794 Long term (current) use of insulin: Secondary | ICD-10-CM | POA: Insufficient documentation

## 2012-10-17 LAB — CBC WITH DIFFERENTIAL/PLATELET
Basophils Absolute: 0 10*3/uL (ref 0.0–0.1)
Basophils Relative: 1 % (ref 0–1)
Eosinophils Absolute: 0.2 10*3/uL (ref 0.0–0.7)
Eosinophils Relative: 3 % (ref 0–5)
HCT: 35.5 % — ABNORMAL LOW (ref 36.0–46.0)
MCH: 29.8 pg (ref 26.0–34.0)
MCHC: 35.8 g/dL (ref 30.0–36.0)
Monocytes Absolute: 0.7 10*3/uL (ref 0.1–1.0)
Neutro Abs: 4.1 10*3/uL (ref 1.7–7.7)
RDW: 14.3 % (ref 11.5–15.5)

## 2012-10-17 LAB — COMPREHENSIVE METABOLIC PANEL
AST: 43 U/L — ABNORMAL HIGH (ref 0–37)
Albumin: 3.4 g/dL — ABNORMAL LOW (ref 3.5–5.2)
Calcium: 9.7 mg/dL (ref 8.4–10.5)
Chloride: 103 mEq/L (ref 96–112)
Creatinine, Ser: 0.87 mg/dL (ref 0.50–1.10)
Total Protein: 7.4 g/dL (ref 6.0–8.3)

## 2012-10-17 MED ORDER — DEXTROSE 5 % IV SOLN
1.0000 g | Freq: Once | INTRAVENOUS | Status: AC
  Administered 2012-10-17: 1 g via INTRAVENOUS
  Filled 2012-10-17: qty 10

## 2012-10-17 MED ORDER — CEPHALEXIN 500 MG PO CAPS: 500.0000 mg | ORAL_CAPSULE | Freq: Four times a day (QID) | ORAL | Status: DC

## 2012-10-17 MED ORDER — ONDANSETRON HCL 4 MG/2ML IJ SOLN
4.0000 mg | Freq: Once | INTRAMUSCULAR | Status: AC
  Administered 2012-10-17: 4 mg via INTRAVENOUS
  Filled 2012-10-17: qty 2

## 2012-10-17 NOTE — ED Provider Notes (Signed)
CSN: [id number]     Arrival date & time 10/17/12  0818 History     First MD Initiated Contact with Patient 10/17/12 [phone]     Chief Complaint  Patient presents with  . Shortness of Breath  . Fever  . Generalized Body Aches  . Urinary Tract Infection   (Consider location/radiation/quality/duration/timing/severity/associated sxs/prior Treatment) HPI This is a 67 year old female who was seen here 2 days ago and diagnosed with a urinary tract infection. She was started on Macrodantin. She presents today stating that she is having nausea and chills. She states that she was told to return if she had any other symptoms. She has taken 4 doses of the Macrodantin. She has not had any vomiting or fever. She states that she did not originally have urinary tract infection symptoms but just felt generally achy with malaise   This has improved some.  She normally has dyspnea with exertion. She states today she feels that she has some dyspnea at rest. She denies any chest pain. She has noted a little bit of increased swelling in her legs bilaterally. She denies any history of DVT or pulmonary embolism. She has been eating as usual and doing activities as usual. Review of her medical records reveals that she had urinalysis with culture which grew out Klebsiella which is susceptible to multiple antibiotics but only intermediately susceptible to Macrodantin. Past Medical History  Diagnosis Date  . Arthritis   . Diabetes mellitus without complication   . Hypertension   . Hepatitis C    Past Surgical History  Procedure Laterality Date  . Tonsillectomy    . Joint replacement    . Abdominal hysterectomy    . Cholecystectomy     No family history on file. History  Substance Use Topics  . Smoking status: Never Smoker   . Smokeless tobacco: Not on file  . Alcohol Use: No   OB History   Grav Para Term Preterm Abortions TAB SAB Ect Mult Living                 Review of Systems  All other systems  reviewed and are negative.    Allergies  Review of patient's allergies indicates no known allergies.  Home Medications   Current Outpatient Rx  Name  Route  Sig  Dispense  Refill  . atenolol (TENORMIN) 50 MG tablet   Oral   Take 50 mg by mouth at bedtime.          . bisacodyl (DULCOLAX) 5 MG EC tablet   Oral   Take 5 mg by mouth once as needed for constipation.         . insulin aspart protamine-insulin aspart (NOVOLOG 70/30) (70-30) 100 UNIT/ML injection   Subcutaneous   Inject 30-45 Units into the skin 2 (two) times daily with a meal. Depending on CGB         . losartan (COZAAR) 100 MG tablet   Oral   Take 100 mg by mouth at bedtime.         . Multiple Vitamins-Minerals (MULTIVITAMIN WITH MINERALS) tablet   Oral   Take 2 tablets by mouth daily.          . naproxen sodium (ANAPROX) 220 MG tablet   Oral   Take 440 mg by mouth daily as needed (pain).          . nitrofurantoin, macrocrystal-monohydrate, (MACROBID) 100 MG capsule   Oral   Take 100 mg by mouth 2 (two) times  daily.         . sitaGLIPtin (JANUVIA) 100 MG tablet   Oral   Take 100 mg by mouth daily at 12 noon.          BP 178/74  Pulse 58  Temp(Src) 97.4 F (36.3 C) (Oral)  Resp 20  SpO2 95% Physical Exam  Nursing note and vitals reviewed. Constitutional: She is oriented to person, place, and time. She appears well-developed and well-nourished.  Morbidly obese  HENT:  Head: Normocephalic and atraumatic.  Right Ear: External ear normal.  Left Ear: External ear normal.  Nose: Nose normal.  Mouth/Throat: Oropharynx is clear and moist.  Eyes: Conjunctivae and EOM are normal. Pupils are equal, round, and reactive to light.  Neck: Normal range of motion. Neck supple.  Cardiovascular: Normal rate, regular rhythm, normal heart sounds and intact distal pulses.   Pulmonary/Chest: Effort normal and breath sounds normal.  Abdominal: Soft. She exhibits no distension and no mass. There is no  rebound and no guarding.  Mild right upper quadrant tenderness  Musculoskeletal: Normal range of motion. She exhibits no edema and no tenderness.  Neurological: She is alert and oriented to person, place, and time. She has normal reflexes.  Skin: Skin is warm and dry.  Psychiatric: She has a normal mood and affect. Her behavior is normal. Judgment and thought content normal.    ED Course   Procedures (including critical care time)  Labs Reviewed  CBC WITH DIFFERENTIAL - Abnormal; Notable for the following:    HCT 35.5 (*)    All other components within normal limits  COMPREHENSIVE METABOLIC PANEL - Abnormal; Notable for the following:    Albumin 3.4 (*)    AST 43 (*)    GFR calc non Af Amer 67 (*)    GFR calc Af Amer 78 (*)    All other components within normal limits   Dg Chest 2 View  10/17/2012   *RADIOLOGY REPORT*  Clinical Data: Fever and cough.  Shortness of breath.  CHEST - 2 VIEW  Comparison: 05/01/2012  Findings: The cardiac silhouette is mildly enlarged.  The aorta is mildly uncoiled.  No mediastinal or hilar masses are noted.  The lungs are clear.  No pleural effusion or pneumothorax.  The bony thorax is demineralized but intact.  IMPRESSION: No acute cardiopulmonary disease.   Original Report Authenticated By: Amie Portland, M.D.   No diagnosis found.  MDM   Results for orders placed during the hospital encounter of 10/17/12  CBC WITH DIFFERENTIAL      Result Value Range   WBC 7.3  4.0 - 10.5 K/uL   RBC 4.26  3.87 - 5.11 MIL/uL   Hemoglobin 12.7  12.0 - 15.0 g/dL   HCT 40.9 (*) 81.1 - 91.4 %   MCV 83.3  78.0 - 100.0 fL   MCH 29.8  26.0 - 34.0 pg   MCHC 35.8  30.0 - 36.0 g/dL   RDW 78.2  95.6 - 21.3 %   Platelets 159  150 - 400 K/uL   Neutrophils Relative % 56  43 - 77 %   Neutro Abs 4.1  1.7 - 7.7 K/uL   Lymphocytes Relative 31  12 - 46 %   Lymphs Abs 2.2  0.7 - 4.0 K/uL   Monocytes Relative 10  3 - 12 %   Monocytes Absolute 0.7  0.1 - 1.0 K/uL   Eosinophils  Relative 3  0 - 5 %   Eosinophils Absolute 0.2  0.0 - 0.7 K/uL   Basophils Relative 1  0 - 1 %   Basophils Absolute 0.0  0.0 - 0.1 K/uL  COMPREHENSIVE METABOLIC PANEL      Result Value Range   Sodium 137  135 - 145 mEq/L   Potassium 3.9  3.5 - 5.1 mEq/L   Chloride 103  96 - 112 mEq/L   CO2 25  19 - 32 mEq/L   Glucose, Bld 90  70 - 99 mg/dL   BUN 13  6 - 23 mg/dL   Creatinine, Ser 9.60  0.50 - 1.10 mg/dL   Calcium 9.7  8.4 - 45.4 mg/dL   Total Protein 7.4  6.0 - 8.3 g/dL   Albumin 3.4 (*) 3.5 - 5.2 g/dL   AST 43 (*) 0 - 37 U/L   ALT 28  0 - 35 U/L   Alkaline Phosphatase 52  39 - 117 U/L   Total Bilirubin 0.5  0.3 - 1.2 mg/dL   GFR calc non Af Amer 67 (*) >90 mL/min   GFR calc Af Amer 78 (*) >90 mL/min   No information on file. Dg Chest 2 View  10/17/2012   *RADIOLOGY REPORT*  Clinical Data: Fever and cough.  Shortness of breath.  CHEST - 2 VIEW  Comparison: 05/01/2012  Findings: The cardiac silhouette is mildly enlarged.  The aorta is mildly uncoiled.  No mediastinal or hilar masses are noted.  The lungs are clear.  No pleural effusion or pneumothorax.  The bony thorax is demineralized but intact.  IMPRESSION: No acute cardiopulmonary disease.   Original Report Authenticated By: Amie Portland, M.D.   Date: 10/17/2012  Rate: 51  Rhythm: sinus bradycardia  QRS Axis: normal  Intervals: normal  ST/T Wave abnormalities: nonspecific T wave changes  Conduction Disutrbances:none  Narrative Interpretation: paired pvc   Old EKG Reviewed: changes noted pvcs new  Patient given IV Rocephin here. She will have her antibiotic changed to Keflex. Her laboratory values appear normal and she has a clear chest x-Vickki Igou and EKG unchanged. She's advised return if she is worse anytime otherwise to followup with her primary care Dr.  Hilario Quarry, MD 10/17/12 1027

## 2012-10-17 NOTE — ED Notes (Signed)
Patient returned to ED 8/24 and was prescribed Keflex 500 mg PO four times a day dispense #20 by Dr Rosalia Hammers.

## 2012-10-17 NOTE — ED Notes (Addendum)
TO ED from home via private vehicle with c/o increasing Shortness of breath--and chills, nausea-- was treated for UTI on Thursday here. Becomes dyspneic with exertion, is more SOB than normal, has no hx of asthma, but has used albuterol in past when living in [place]. Sleeps on 3 pillows at home.

## 2012-10-18 ENCOUNTER — Ambulatory Visit
Admission: RE | Admit: 2012-10-18 | Discharge: 2012-10-18 | Disposition: A | Discharge status: Home / Self Care | Payer: Medicare HMO | Source: Ambulatory Visit | Attending: Internal Medicine | Admitting: Internal Medicine

## 2012-10-18 DIAGNOSIS — K769 Liver disease, unspecified: Secondary | ICD-10-CM

## 2012-10-18 MED ORDER — GADOXETATE DISODIUM 0.25 MMOL/ML IV SOLN
10.0000 mL | Freq: Once | INTRAVENOUS | Status: AC | PRN
  Administered 2012-10-18: 10 mL via INTRAVENOUS

## 2013-09-15 ENCOUNTER — Other Ambulatory Visit: Payer: Self-pay | Admitting: Nurse Practitioner

## 2013-09-15 DIAGNOSIS — C220 Liver cell carcinoma: Secondary | ICD-10-CM

## 2013-10-10 ENCOUNTER — Other Ambulatory Visit: Payer: Medicare HMO

## 2013-10-19 ENCOUNTER — Ambulatory Visit
Admission: RE | Admit: 2013-10-19 | Discharge: 2013-10-19 | Disposition: A | Discharge status: Home / Self Care | Payer: Medicare HMO | Source: Ambulatory Visit | Attending: Nurse Practitioner | Admitting: Nurse Practitioner

## 2013-10-19 ENCOUNTER — Other Ambulatory Visit: Payer: Medicare HMO

## 2013-10-19 DIAGNOSIS — C220 Liver cell carcinoma: Secondary | ICD-10-CM

## 2013-10-19 MED ORDER — IOHEXOL 300 MG/ML  SOLN
125.0000 mL | Freq: Once | INTRAMUSCULAR | Status: AC | PRN
  Administered 2013-10-19: 125 mL via INTRAVENOUS

## 2013-11-02 ENCOUNTER — Other Ambulatory Visit: Payer: Self-pay | Admitting: Physical Medicine and Rehabilitation

## 2013-11-02 DIAGNOSIS — M542 Cervicalgia: Secondary | ICD-10-CM

## 2013-11-10 ENCOUNTER — Other Ambulatory Visit (HOSPITAL_COMMUNITY): Payer: Self-pay | Admitting: Internal Medicine

## 2013-11-10 ENCOUNTER — Telehealth (HOSPITAL_COMMUNITY): Payer: Self-pay | Admitting: *Deleted

## 2013-11-10 DIAGNOSIS — I159 Secondary hypertension, unspecified: Secondary | ICD-10-CM

## 2013-11-12 ENCOUNTER — Ambulatory Visit
Admission: RE | Admit: 2013-11-12 | Discharge: 2013-11-12 | Disposition: A | Discharge status: Home / Self Care | Payer: Medicare HMO | Source: Ambulatory Visit | Attending: Physical Medicine and Rehabilitation | Admitting: Physical Medicine and Rehabilitation

## 2013-11-12 DIAGNOSIS — M542 Cervicalgia: Secondary | ICD-10-CM

## 2013-11-16 ENCOUNTER — Other Ambulatory Visit: Payer: Medicare HMO

## 2013-11-23 ENCOUNTER — Telehealth (HOSPITAL_COMMUNITY): Payer: Self-pay | Admitting: *Deleted

## 2013-11-23 ENCOUNTER — Ambulatory Visit (HOSPITAL_COMMUNITY): Payer: Medicare HMO

## 2013-11-24 ENCOUNTER — Ambulatory Visit (HOSPITAL_COMMUNITY)
Admission: RE | Admit: 2013-11-24 | Discharge: 2013-11-24 | Disposition: A | Discharge status: Home / Self Care | Payer: Medicare HMO | Source: Ambulatory Visit | Attending: Cardiology | Admitting: Cardiology

## 2013-11-24 DIAGNOSIS — R609 Edema, unspecified: Secondary | ICD-10-CM | POA: Insufficient documentation

## 2013-11-24 DIAGNOSIS — I10 Essential (primary) hypertension: Secondary | ICD-10-CM | POA: Insufficient documentation

## 2013-11-24 DIAGNOSIS — I517 Cardiomegaly: Secondary | ICD-10-CM

## 2013-11-24 DIAGNOSIS — E119 Type 2 diabetes mellitus without complications: Secondary | ICD-10-CM | POA: Diagnosis not present

## 2013-11-24 DIAGNOSIS — Z794 Long term (current) use of insulin: Secondary | ICD-10-CM | POA: Insufficient documentation

## 2013-11-24 DIAGNOSIS — I159 Secondary hypertension, unspecified: Secondary | ICD-10-CM

## 2013-11-24 HISTORY — PX: TRANSTHORACIC ECHOCARDIOGRAM: SHX275

## 2013-11-24 NOTE — Progress Notes (Signed)
2D Echo Performed 11/24/2013    Marygrace Drought, RCS

## 2013-11-30 ENCOUNTER — Telehealth: Payer: Self-pay

## 2013-11-30 NOTE — Telephone Encounter (Signed)
Ref to cardiololgy received from pt pcp(Dr.Sanders) for pt to be scheduled with Dr.Smith for a new pt appt. Pt sts that she was not aware she was being referred. Pt would like to talk with her pcp before she schedules an appt. Pt will call back to schedule

## 2013-12-01 ENCOUNTER — Telehealth: Payer: Self-pay | Admitting: Internal Medicine

## 2013-12-01 NOTE — Telephone Encounter (Signed)
Pt said she an echo here in 11-24-13. Dr Baird Cancer still have not received the report.Please send this over asap.

## 2013-12-01 NOTE — Telephone Encounter (Signed)
Echo sent to Dr Baird Cancer

## 2014-06-20 ENCOUNTER — Encounter (HOSPITAL_COMMUNITY): Payer: Self-pay | Admitting: *Deleted

## 2014-06-20 ENCOUNTER — Emergency Department (HOSPITAL_COMMUNITY)
Admission: EM | Admit: 2014-06-20 | Discharge: 2014-06-20 | Disposition: A | Discharge status: Home / Self Care | Payer: Medicare HMO | Attending: Emergency Medicine | Admitting: Emergency Medicine

## 2014-06-20 DIAGNOSIS — Z794 Long term (current) use of insulin: Secondary | ICD-10-CM | POA: Diagnosis not present

## 2014-06-20 DIAGNOSIS — Y999 Unspecified external cause status: Secondary | ICD-10-CM | POA: Insufficient documentation

## 2014-06-20 DIAGNOSIS — Z8739 Personal history of other diseases of the musculoskeletal system and connective tissue: Secondary | ICD-10-CM | POA: Diagnosis not present

## 2014-06-20 DIAGNOSIS — Y939 Activity, unspecified: Secondary | ICD-10-CM | POA: Insufficient documentation

## 2014-06-20 DIAGNOSIS — Z79899 Other long term (current) drug therapy: Secondary | ICD-10-CM | POA: Insufficient documentation

## 2014-06-20 DIAGNOSIS — E119 Type 2 diabetes mellitus without complications: Secondary | ICD-10-CM | POA: Insufficient documentation

## 2014-06-20 DIAGNOSIS — S0501XA Injury of conjunctiva and corneal abrasion without foreign body, right eye, initial encounter: Secondary | ICD-10-CM | POA: Diagnosis not present

## 2014-06-20 DIAGNOSIS — Y929 Unspecified place or not applicable: Secondary | ICD-10-CM | POA: Insufficient documentation

## 2014-06-20 DIAGNOSIS — X58XXXA Exposure to other specified factors, initial encounter: Secondary | ICD-10-CM | POA: Insufficient documentation

## 2014-06-20 DIAGNOSIS — I10 Essential (primary) hypertension: Secondary | ICD-10-CM | POA: Diagnosis not present

## 2014-06-20 DIAGNOSIS — Z8619 Personal history of other infectious and parasitic diseases: Secondary | ICD-10-CM | POA: Insufficient documentation

## 2014-06-20 DIAGNOSIS — S0591XA Unspecified injury of right eye and orbit, initial encounter: Secondary | ICD-10-CM | POA: Diagnosis present

## 2014-06-20 DIAGNOSIS — Z792 Long term (current) use of antibiotics: Secondary | ICD-10-CM | POA: Diagnosis not present

## 2014-06-20 MED ORDER — PROPARACAINE HCL 0.5 % OP SOLN
1.0000 [drp] | Freq: Once | OPHTHALMIC | Status: AC
  Administered 2014-06-20: 1 [drp] via OPHTHALMIC
  Filled 2014-06-20: qty 15

## 2014-06-20 MED ORDER — TRAMADOL HCL 50 MG PO TABS: 50.0000 mg | ORAL_TABLET | Freq: Four times a day (QID) | ORAL | Status: DC | PRN

## 2014-06-20 MED ORDER — CIPROFLOXACIN HCL 0.3 % OP SOLN: 1.0000 [drp] | OPHTHALMIC | Status: DC

## 2014-06-20 MED ORDER — FLUORESCEIN SODIUM 1 MG OP STRP
1.0000 | ORAL_STRIP | Freq: Once | OPHTHALMIC | Status: AC
  Administered 2014-06-20: 1 via OPHTHALMIC
  Filled 2014-06-20: qty 1

## 2014-06-20 NOTE — Discharge Instructions (Signed)
Please follow the directions provided. Be sure to follow-up with the ophthalmologist in a few days to ensure you are getting better.  Use the eye drops as directed and the pain meds as needed.  Don't hesitate to return for any new, worsening or concerning symptoms.     SEEK MEDICAL CARE IF:  You have pain, light sensitivity, and a scratchy feeling in one eye or both eyes.  Your pressure patch keeps loosening up, and you can blink your eye under the patch after treatment.  Any kind of discharge develops from the eye after treatment or if the lids stick together in the morning.  You have the same symptoms in the morning as you did with the original abrasion days, weeks, or months after the abrasion healed.

## 2014-06-20 NOTE — ED Notes (Signed)
Pt presents from home c/o getting conditioner in her eyes last night and her eyes are now burning and blurry.  Pt states this AM her right eye is swollen and both eyes are red.  Pt a x 4, NAD.

## 2014-06-20 NOTE — ED Provider Notes (Signed)
CSN: [id number]     Arrival date & time 06/20/14  1517 History   First MD Initiated Contact with Patient 06/20/14 [phone]     Chief Complaint  Patient presents with  . Eye Problem   (Consider location/radiation/quality/duration/timing/severity/associated sxs/prior Treatment) HPI  Teresa Golden is a 69 yo female presenting with report of eye pain.  She states she accidentally got conditioner in her eyes last night and initially had bilat eye pain and blurriness that improved after she wiped her eyes with a towel. She noted during the night she felt some pain in her right eye and when she woke up at 6 am this morning, the pain in her right had worsened with associated redness, and tearing of clear fluids. Her left is not painful or reddened. She rates the discomfort as 6/10. She reports a foreign body sensation in the right eye. She denies any fever, chills, headache, orbital or facial swelling, nausea, vomiting or current visual changes.      Past Medical History  Diagnosis Date  . Arthritis   . Diabetes mellitus without complication   . Hypertension   . Hepatitis C    Past Surgical History  Procedure Laterality Date  . Tonsillectomy    . Joint replacement    . Abdominal hysterectomy    . Cholecystectomy     No family history on file. History  Substance Use Topics  . Smoking status: Never Smoker   . Smokeless tobacco: Not on file  . Alcohol Use: Yes   OB History    No data available     Review of Systems  Constitutional: Negative for fever.  HENT: Negative for sore throat.   Eyes: Positive for pain, discharge and redness. Negative for visual disturbance.  Respiratory: Negative for cough.   Cardiovascular: Negative for chest pain.  Musculoskeletal: Negative for myalgias.  Skin: Negative for rash.  Neurological: Negative for weakness, numbness and headaches.      Allergies  Review of patient's allergies indicates no known allergies.  Home Medications   Prior to  Admission medications   Medication Sig Start Date End Date Taking? Authorizing Provider  atenolol (TENORMIN) 50 MG tablet Take 50 mg by mouth at bedtime.     Historical Provider, MD  bisacodyl (DULCOLAX) 5 MG EC tablet Take 5 mg by mouth once as needed for constipation.    Historical Provider, MD  cephALEXin (KEFLEX) 500 MG capsule Take 1 capsule (500 mg total) by mouth 4 (four) times daily. 10/17/12   [name], MD  insulin aspart protamine-insulin aspart (NOVOLOG 70/30) (70-30) 100 UNIT/ML injection Inject 30-45 Units into the skin 2 (two) times daily with a meal. Depending on CGB    Historical Provider, MD  losartan (COZAAR) 100 MG tablet Take 100 mg by mouth at bedtime.    Historical Provider, MD  Multiple Vitamins-Minerals (MULTIVITAMIN WITH MINERALS) tablet Take 2 tablets by mouth daily.     Historical Provider, MD  naproxen sodium (ANAPROX) 220 MG tablet Take 440 mg by mouth daily as needed (pain).     Historical Provider, MD  sitaGLIPtin (JANUVIA) 100 MG tablet Take 100 mg by mouth daily at 12 noon.    Historical Provider, MD   BP 169/82 mmHg  Pulse 80  Temp(Src) 98 F (36.7 C) (Oral)  Resp 20  SpO2 98% Physical Exam  Constitutional: She is oriented to person, place, and time. She appears well-developed and well-nourished. No distress.  HENT:  Head: Normocephalic and atraumatic.  Eyes: EOM  are normal. Pupils are equal, round, and reactive to light. Right eye exhibits discharge ( clear). Left eye exhibits no discharge. Right conjunctiva is injected. Right conjunctiva has no hemorrhage. No scleral icterus. Right eye exhibits normal extraocular motion.  Slit lamp exam:      The right eye shows corneal abrasion and fluorescein uptake. The right eye shows no corneal flare and no corneal ulcer.  Corneal abrasion noted in circular motion in center of iris  Neck: Normal range of motion. Neck supple.  Cardiovascular: Normal rate, regular rhythm and intact distal pulses.   Pulmonary/Chest:  Effort normal and breath sounds normal. No respiratory distress.  Abdominal: Soft. There is no tenderness.  Musculoskeletal: She exhibits no tenderness.  Lymphadenopathy:    She has no cervical adenopathy.  Neurological: She is alert and oriented to person, place, and time. No cranial nerve deficit.  Skin: Skin is warm and dry. No rash noted. She is not diaphoretic.  Psychiatric: She has a normal mood and affect.  Nursing note and vitals reviewed.   ED Course  Procedures (including critical care time) Labs Review Labs Reviewed - No data to display  Imaging Review No results found.   EKG Interpretation None      MDM   Final diagnoses:  Corneal abrasion, right, initial encounter   69 yo with corneal abrasion on exam. Her eye was irrigated w NS, and no evidence of FB.  No change in vision, acuity equal bilaterally.  Pt is not a contact lens wearer.  Exam non-concerning for orbital cellulitis, hyphema, corneal ulcers. Patient will be discharged home with erythromycin.   Patient understands to follow up with ophthalmology, & to return to ER if new symptoms develop including change in vision, purulent drainage, or entrapment. Pt is well-appearing, in no acute distress and vital signs reviewed and not concerning. She appears safe to be discharged. Return precautions provided. Pt aware of plan and in agreement.    Filed Vitals:   06/20/14 0937 06/20/14 1043  BP: 169/82 153/70  Pulse: 80 63  Temp: 98 F (36.7 C)   TempSrc: Oral   Resp: 20 16  SpO2: 98% 98%   Meds given in ED:  Medications  fluorescein ophthalmic strip 1 strip (1 strip Both Eyes Given 06/20/14 1019)  proparacaine (ALCAINE) 0.5 % ophthalmic solution 1 drop (1 drop Both Eyes Given 06/20/14 1019)    Discharge Medication List as of 06/20/2014 11:38 AM    START taking these medications   Details  ciprofloxacin (CILOXAN) 0.3 % ophthalmic solution Place 1 drop into the right eye every 4 (four) hours. Place one drop  in effected eye every 4 hours until follow up with opthalmologist, Starting 06/20/2014, Until Discontinued, Print    traMADol (ULTRAM) 50 MG tablet Take 1 tablet (50 mg total) by mouth every 6 (six) hours as needed., Starting 06/20/2014, Until Discontinued, Print           Britt Bottom, NP 06/21/14 1442  Fredia Sorrow, MD 06/22/14 1736

## 2014-07-17 ENCOUNTER — Other Ambulatory Visit (HOSPITAL_COMMUNITY): Payer: Self-pay | Admitting: Nurse Practitioner

## 2014-07-17 DIAGNOSIS — B182 Chronic viral hepatitis C: Secondary | ICD-10-CM

## 2014-08-01 ENCOUNTER — Ambulatory Visit (HOSPITAL_COMMUNITY)
Admission: RE | Admit: 2014-08-01 | Discharge: 2014-08-01 | Disposition: A | Discharge status: Home / Self Care | Payer: Medicare HMO | Source: Ambulatory Visit | Attending: Nurse Practitioner | Admitting: Nurse Practitioner

## 2014-08-01 DIAGNOSIS — B182 Chronic viral hepatitis C: Secondary | ICD-10-CM | POA: Diagnosis present

## 2014-08-01 DIAGNOSIS — K769 Liver disease, unspecified: Secondary | ICD-10-CM | POA: Insufficient documentation

## 2014-08-01 DIAGNOSIS — Z9049 Acquired absence of other specified parts of digestive tract: Secondary | ICD-10-CM | POA: Diagnosis not present

## 2014-08-26 IMAGING — CT CT ABDOMEN WO/W CM
2 of 8 series · 14 of 46 positions shown, 18 images · IV contrast (READICAT/WATER & [ID] OMNI 300)
Comparison: CT 06/02/2012.  MRI is 07/01/2012 and 10/18/2012.

CLINICAL DATA: History of hepatitis-C status post Harvoni therapy.
Follow up liver lesions.

BUN and creatinine were obtained on site at [HOSPITAL] at
[HOSPITAL].Results: BUN 16 mg/dL, Creatinine 1.3 mg/dL.
EXAM:
CT ABDOMEN WITHOUT AND WITH CONTRAST
TECHNIQUE: Multidetector CT imaging of the abdomen was performed following the
standard protocol before and following the bolus administration of
intravenous contrast.
CONTRAST:  125mL OMNIPAQUE IOHEXOL 300 MG/ML  SOLN

[Series 3: arterial/portal venous · axial · arterial · 0.95mm/px · z∈[-163,+57]mm · 11 of 210 slices shown, 15 images]
[im 23/210  soft-tissue]
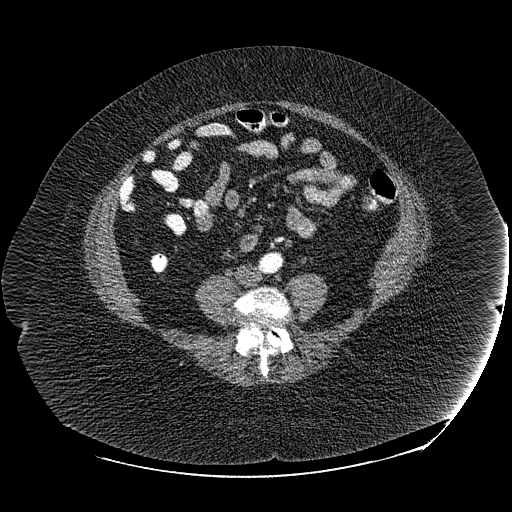
[im 23/210  bone]
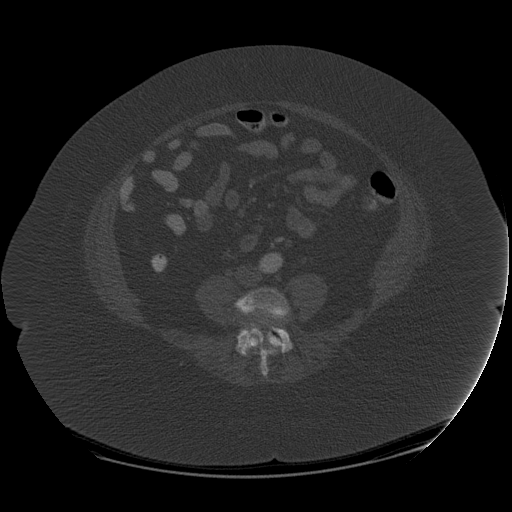
[im 45/210  soft-tissue]
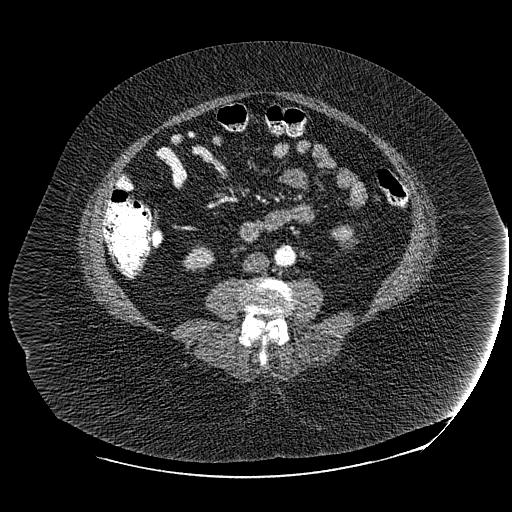
[im 67/210  soft-tissue]
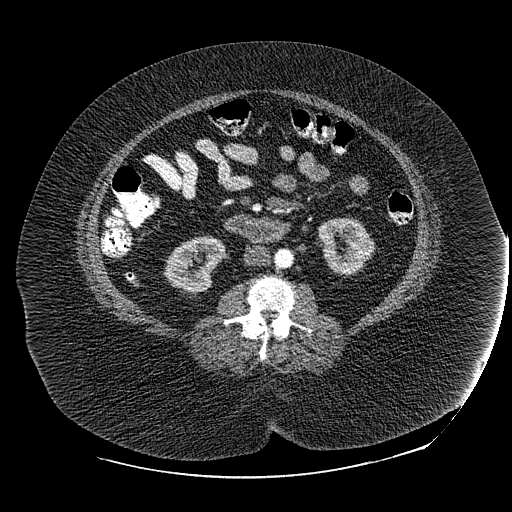
[im 89/210  soft-tissue]
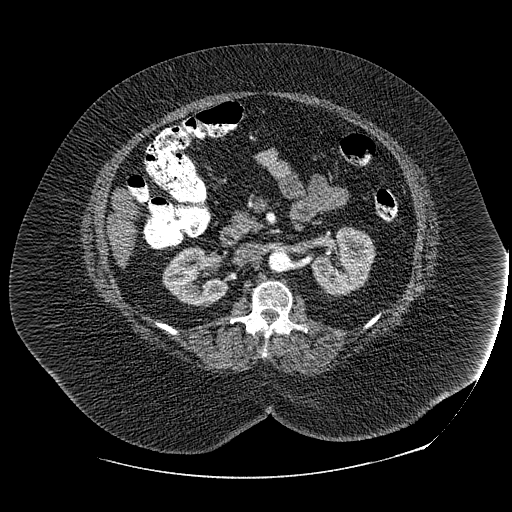
[im 111/210  soft-tissue]
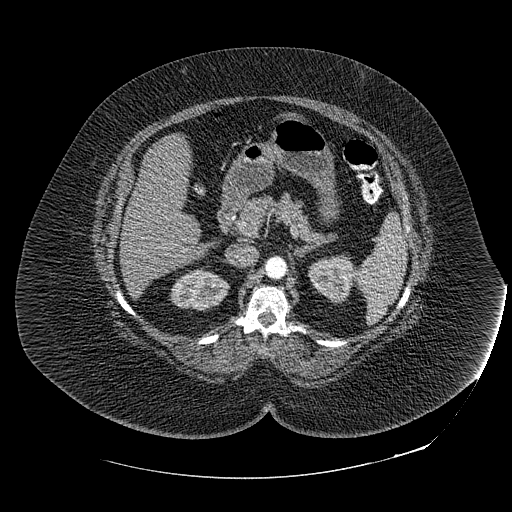
[im 133/210  soft-tissue]
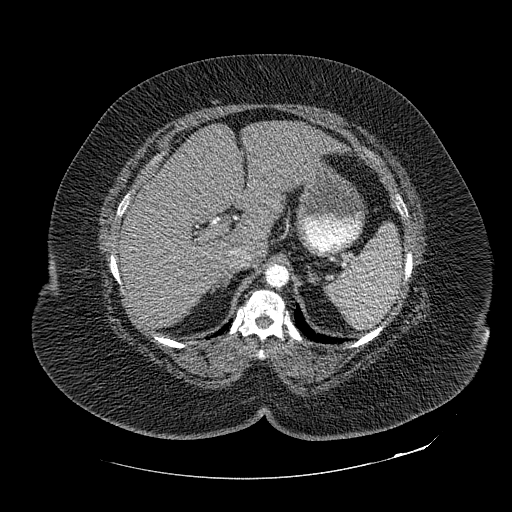
[im 155/210  soft-tissue]
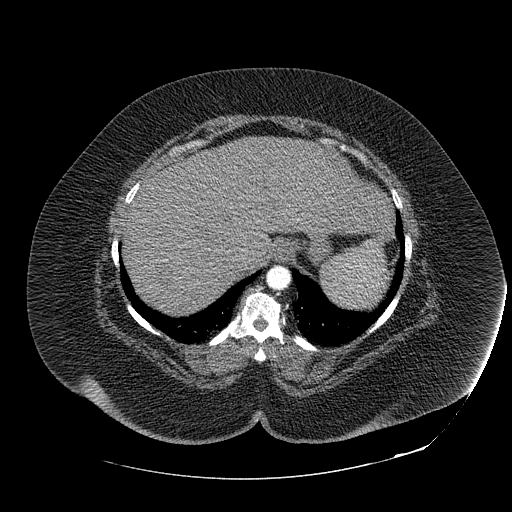
[im 166/210  lung]
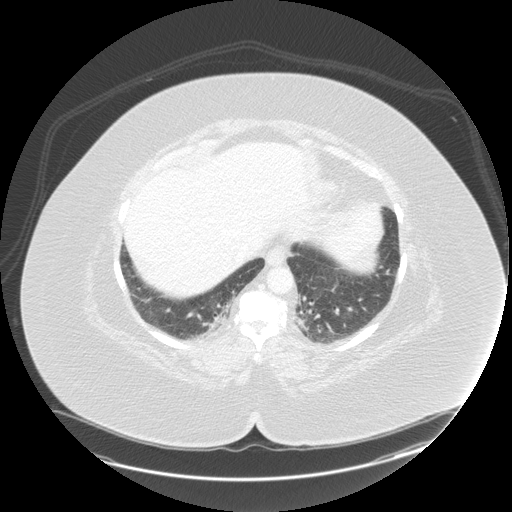
[im 177/210  soft-tissue]
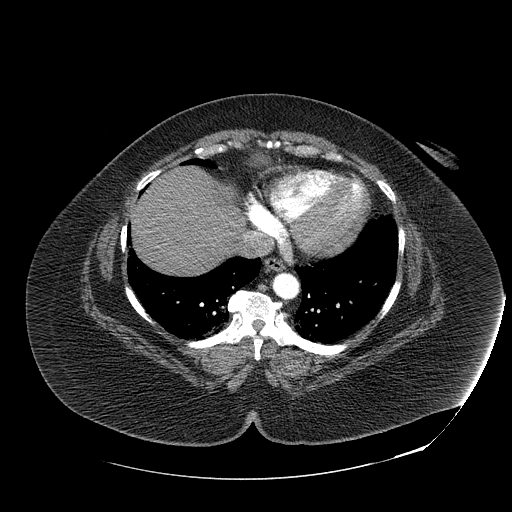
[im 177/210  lung]
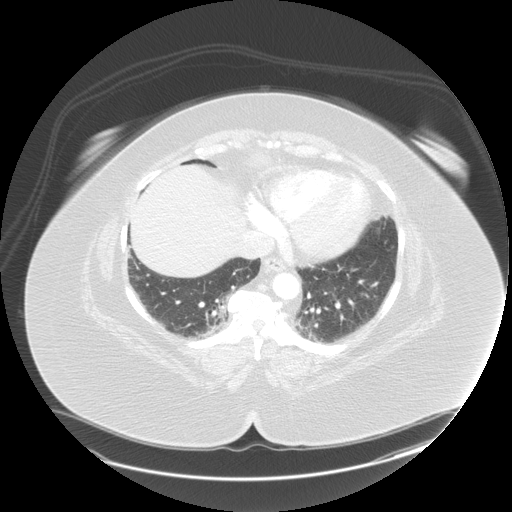
[im 188/210  lung]
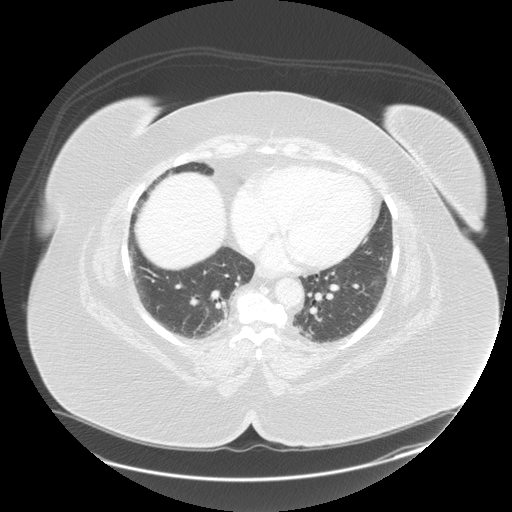
[im 199/210  soft-tissue]
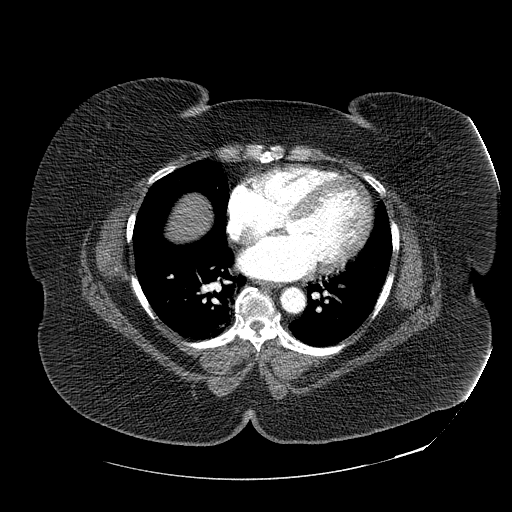
[im 199/210  lung]
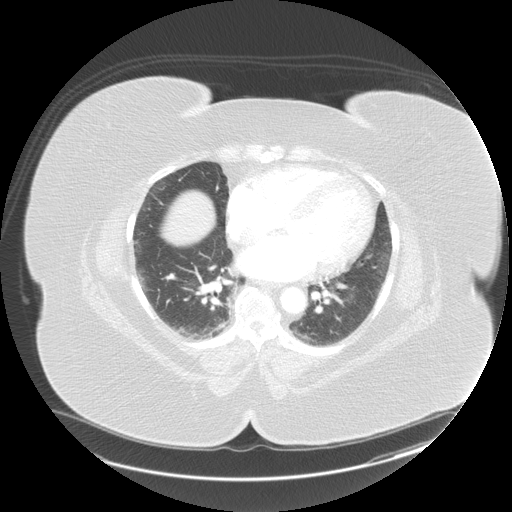
[im 199/210  bone]
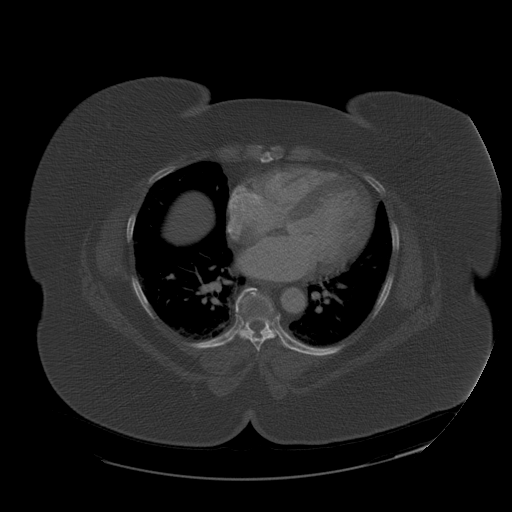

[Series 501: cor arterial · coronal · arterial · 0.95mm/px · 3 of 153 slices shown]
[im 39/153  soft-tissue]
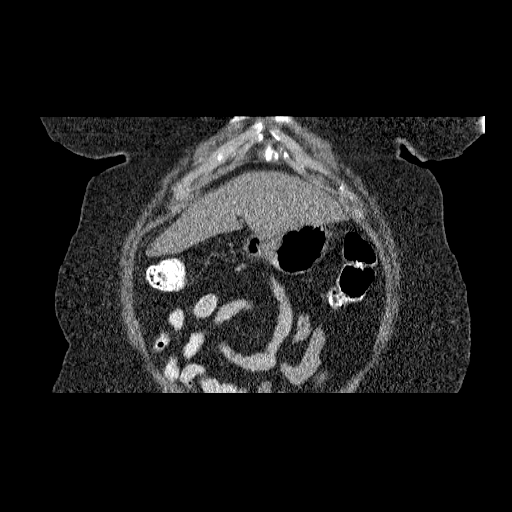
[im 77/153  soft-tissue]
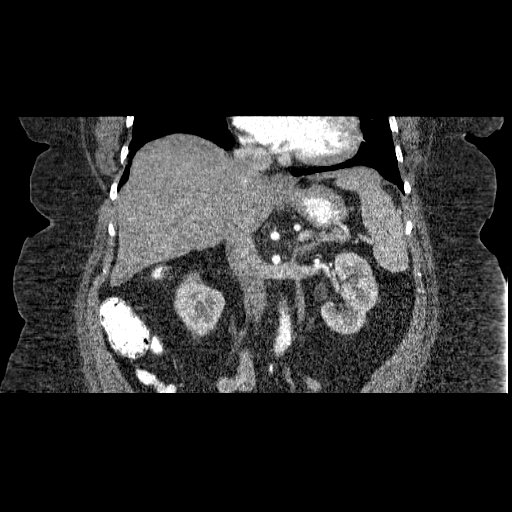
[im 115/153  soft-tissue]
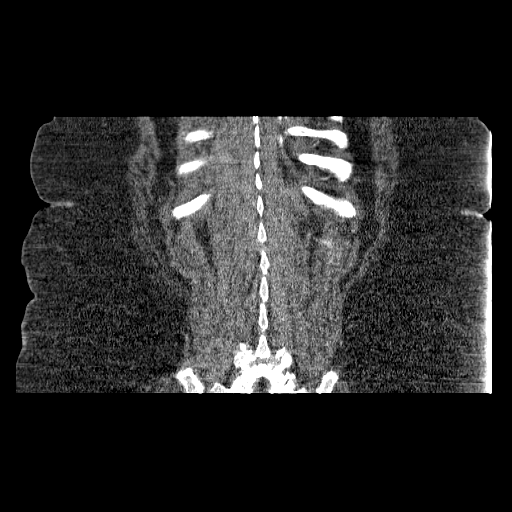

[14 of 46 positions shown; findings below may reference images not displayed]

FINDINGS: Lung bases: Dependent atelectasis or scarring in both lung bases is
similar to prior CT. No significant pleural or pericardial effusion.

Liver/Biliary/Pancreas: The liver contours are stable without clear
morphologic changes of cirrhosis. The dominant low-density lesion in
the posterior segment of the right hepatic lobe is unchanged, best
seen on the portal phase images, measuring 3.6 x 2.4 cm on image 136
of series 3. This lesion is less evident on the delayed
post-contrast images, supporting enhancement as suggested on prior
studies. The tiny low-density lesion in the dome of the right
hepatic lobe is also stable, best seen on images 113 and 114. No new
or enlarging lesions are identified. No evidence of biliary
dilatation status post cholecystectomy. The pancreas appears normal.

Spleen/Adrenal glands: Unremarkable.

Kidneys/Ureters: Low-density lesion projecting posteriorly from the
lower pole of the right kidney is suboptimally evaluated based on
size and body habitus, although is grossly stable from the baseline
CT, measuring 11 mm on sagittal image 63 of series 503. The kidneys
otherwise appear normal. There is no hydronephrosis.

Bowel/Peritoneum: The stomach, appendix and visualized portions of
the small bowel and colon demonstrate no significant findings. No
ascites or peritoneal nodularity.

Retroperitoneum: There are stable small lymph nodes within the porta
hepatis, not pathologically enlarged. Mild aortoiliac
atherosclerosis is noted.

Abdominal wall: No abdominal wall masses or hernias.

Musculoskeletal: No acute or significant osseus findings.
IMPRESSION: 1. The indeterminate lesion involving the posterior segment of the
right hepatic lobe is unchanged in size from baseline CT of 16
months ago. Enhancement characteristics remain nonspecific. Given
the patient's history, continued surveillance recommended.
2. No new or enlarging lesions identified.
3. Stable low-density lesion projecting posteriorly from the lower
pole of the right kidney.

## 2014-09-27 ENCOUNTER — Other Ambulatory Visit: Payer: Self-pay

## 2014-09-27 DIAGNOSIS — Z1231 Encounter for screening mammogram for malignant neoplasm of breast: Secondary | ICD-10-CM

## 2014-09-28 ENCOUNTER — Emergency Department (HOSPITAL_COMMUNITY): Payer: Medicare HMO

## 2014-09-28 ENCOUNTER — Emergency Department (HOSPITAL_COMMUNITY)
Admission: EM | Admit: 2014-09-28 | Discharge: 2014-09-29 | Disposition: A | Discharge status: Home / Self Care | Payer: Medicare HMO | Attending: Emergency Medicine | Admitting: Emergency Medicine

## 2014-09-28 ENCOUNTER — Encounter (HOSPITAL_COMMUNITY): Payer: Self-pay | Admitting: Emergency Medicine

## 2014-09-28 DIAGNOSIS — I10 Essential (primary) hypertension: Secondary | ICD-10-CM | POA: Insufficient documentation

## 2014-09-28 DIAGNOSIS — Z794 Long term (current) use of insulin: Secondary | ICD-10-CM | POA: Insufficient documentation

## 2014-09-28 DIAGNOSIS — Z8619 Personal history of other infectious and parasitic diseases: Secondary | ICD-10-CM | POA: Diagnosis not present

## 2014-09-28 DIAGNOSIS — Z79899 Other long term (current) drug therapy: Secondary | ICD-10-CM | POA: Diagnosis not present

## 2014-09-28 DIAGNOSIS — R630 Anorexia: Secondary | ICD-10-CM | POA: Diagnosis not present

## 2014-09-28 DIAGNOSIS — E119 Type 2 diabetes mellitus without complications: Secondary | ICD-10-CM | POA: Diagnosis not present

## 2014-09-28 DIAGNOSIS — R079 Chest pain, unspecified: Secondary | ICD-10-CM

## 2014-09-28 DIAGNOSIS — Z791 Long term (current) use of non-steroidal anti-inflammatories (NSAID): Secondary | ICD-10-CM | POA: Insufficient documentation

## 2014-09-28 DIAGNOSIS — M10071 Idiopathic gout, right ankle and foot: Secondary | ICD-10-CM | POA: Diagnosis not present

## 2014-09-28 DIAGNOSIS — M109 Gout, unspecified: Secondary | ICD-10-CM

## 2014-09-28 DIAGNOSIS — R531 Weakness: Secondary | ICD-10-CM | POA: Diagnosis present

## 2014-09-28 DIAGNOSIS — M1990 Unspecified osteoarthritis, unspecified site: Secondary | ICD-10-CM | POA: Diagnosis not present

## 2014-09-28 LAB — CBC WITH DIFFERENTIAL/PLATELET
BASOS ABS: 0 10*3/uL (ref 0.0–0.1)
BASOS PCT: 0 % (ref 0–1)
EOS ABS: 0.1 10*3/uL (ref 0.0–0.7)
Eosinophils Relative: 1 % (ref 0–5)
HEMATOCRIT: 35.9 % — AB (ref 36.0–46.0)
HEMOGLOBIN: 12.2 g/dL (ref 12.0–15.0)
LYMPHS PCT: 13 % (ref 12–46)
Lymphs Abs: 1.8 10*3/uL (ref 0.7–4.0)
MCH: 28.9 pg (ref 26.0–34.0)
MCHC: 34 g/dL (ref 30.0–36.0)
MCV: 85.1 fL (ref 78.0–100.0)
MONO ABS: 1.4 10*3/uL — AB (ref 0.1–1.0)
Monocytes Relative: 11 % (ref 3–12)
NEUTROS ABS: 10.2 10*3/uL — AB (ref 1.7–7.7)
Neutrophils Relative %: 75 % (ref 43–77)
Platelets: 200 10*3/uL (ref 150–400)
RBC: 4.22 MIL/uL (ref 3.87–5.11)
RDW: 14.5 % (ref 11.5–15.5)
WBC: 13.6 10*3/uL — AB (ref 4.0–10.5)

## 2014-09-28 LAB — BASIC METABOLIC PANEL
ANION GAP: 11 (ref 5–15)
BUN: 13 mg/dL (ref 6–20)
CALCIUM: 9.4 mg/dL (ref 8.9–10.3)
CHLORIDE: 101 mmol/L (ref 101–111)
CO2: 23 mmol/L (ref 22–32)
CREATININE: 1.07 mg/dL — AB (ref 0.44–1.00)
GFR calc non Af Amer: 51 mL/min — ABNORMAL LOW (ref >60)
GFR, EST AFRICAN AMERICAN: 60 mL/min — AB (ref >60)
Glucose, Bld: 164 mg/dL — ABNORMAL HIGH (ref 65–99)
Potassium: 4.3 mmol/L (ref 3.5–5.1)
SODIUM: 135 mmol/L (ref 135–145)

## 2014-09-28 LAB — SEDIMENTATION RATE: SED RATE: 54 mm/h — AB (ref 0–22)

## 2014-09-28 LAB — ETHANOL: Alcohol, Ethyl (B): <5 mg/dL (ref <5)

## 2014-09-28 LAB — URIC ACID: Uric Acid, Serum: 9.8 mg/dL — ABNORMAL HIGH (ref 2.3–6.6)

## 2014-09-28 MED ORDER — MORPHINE SULFATE 4 MG/ML IJ SOLN
4.0000 mg | INTRAMUSCULAR | Status: DC | PRN
  Administered 2014-09-28: 4 mg via INTRAVENOUS
  Filled 2014-09-28: qty 1

## 2014-09-28 MED ORDER — PREDNISONE 20 MG PO TABS: 20.0000 mg | ORAL_TABLET | Freq: Two times a day (BID) | ORAL | Status: DC

## 2014-09-28 MED ORDER — COLCHICINE 0.6 MG PO TABS: 0.6000 mg | ORAL_TABLET | Freq: Every day | ORAL | Status: DC

## 2014-09-28 MED ORDER — HYDROCODONE-ACETAMINOPHEN 5-325 MG PO TABS: 1.0000 | ORAL_TABLET | ORAL | Status: DC | PRN

## 2014-09-28 MED ORDER — METHYLPREDNISOLONE SODIUM SUCC 125 MG IJ SOLR
125.0000 mg | Freq: Once | INTRAMUSCULAR | Status: AC
  Administered 2014-09-28: 125 mg via INTRAVENOUS
  Filled 2014-09-28: qty 2

## 2014-09-28 MED ORDER — KETOROLAC TROMETHAMINE 30 MG/ML IJ SOLN
30.0000 mg | Freq: Once | INTRAMUSCULAR | Status: AC
  Administered 2014-09-28: 30 mg via INTRAVENOUS
  Filled 2014-09-28: qty 1

## 2014-09-28 MED ORDER — ONDANSETRON HCL 4 MG/2ML IJ SOLN
4.0000 mg | Freq: Once | INTRAMUSCULAR | Status: AC
  Administered 2014-09-28: 4 mg via INTRAVENOUS
  Filled 2014-09-28: qty 2

## 2014-09-28 NOTE — ED Notes (Signed)
PER EMS- pt picked up from [facility] c/o generalized weakness x4 days. Pt called  EMS due to concerns of being weak, lack of appetite, and anxiety.  Pt alert and oriented x4.  Pt reports there's a mosquito bite on right arm that's causing pain.  Pt has hx of DM, reported was 70 earlier today.  EMS reports CBG is 150.  Friend reports pt has been drinking a lot more alcohol which may be causing depression and anxiety.

## 2014-09-28 NOTE — ED Notes (Signed)
Put pt on bed pan. Pt was unable to give a urine sample

## 2014-09-28 NOTE — Discharge Instructions (Signed)
Gout °Gout is when your joints become red, sore, and swell (inflamed). This is caused by the buildup of uric acid crystals in the joints. Uric acid is a chemical that is normally in the blood. If the level of uric acid gets too high in the blood, these crystals form in your joints and tissues. Over time, these crystals can form into masses near the joints and tissues. These masses can destroy bone and cause the bone to look misshapen (deformed). °HOME CARE  °· Do not take aspirin for pain. °· Only take medicine as told by your doctor. °· Rest the joint as much as you can. When in bed, keep sheets and blankets off painful areas. °· Keep the sore joints raised (elevated). °· Put warm or cold packs on painful joints. Use of warm or cold packs depends on which works best for you. °· Use crutches if the painful joint is in your leg. °· Drink enough fluids to keep your pee (urine) clear or pale yellow. Limit alcohol, sugary drinks, and drinks with fructose in them. °· Follow your diet instructions. Pay careful attention to how much protein you eat. Include fruits, vegetables, whole grains, and fat-free or low-fat milk products in your daily diet. Talk to your doctor or dietitian about the use of coffee, vitamin C, and cherries. These may help lower uric acid levels. °· Keep a healthy body weight. °GET HELP RIGHT AWAY IF:  °· You have watery poop (diarrhea), throw up (vomit), or have any side effects from medicines. °· You do not feel better in 24 hours, or you are getting worse. °· Your joint becomes suddenly more tender, and you have chills or a fever. °MAKE SURE YOU:  °· Understand these instructions. °· Will watch your condition. °· Will get help right away if you are not doing well or get worse. °Document Released: 11/20/2007 Document Revised: 06/27/2013 Document Reviewed: 09/24/2011 °[employer]® Patient Information ©2015 [employer]. This information is not intended to replace advice given to you by your health care  provider. Make sure you discuss any questions you have with your health care provider. ° °

## 2014-09-28 NOTE — ED Notes (Signed)
Bed: WA07 Expected date:  Expected time:  Means of arrival:  Comments: EMS 69 yo female from assisted living/generalized weakness x 1 day with back pain

## 2014-09-28 NOTE — ED Notes (Signed)
[facility] dispatch called for transfer.

## 2014-09-28 NOTE — ED Notes (Signed)
Pt refused to provide urine, reports she ready to be discharged and is feeling better.

## 2014-09-28 NOTE — ED Notes (Signed)
MD at bedside. 

## 2014-09-28 NOTE — ED Notes (Signed)
Pt reports that family member will be transporting her back home. Rn completed call to cancel [facility] transport

## 2014-09-28 NOTE — ED Provider Notes (Signed)
CSN: [id number]     Arrival date & time 09/28/14  1942 History   First MD Initiated Contact with Patient 09/28/14 1956     Chief Complaint  Patient presents with  . Weakness     HPI  [place]. Multiple complaints. Feels that she has not had a good appetite for the last few days and feels weak. Has had pain in the right foot, and ankle. Also pain in her right back.  Denies being short of breath. No fever. No falls or injuries. Denies any vomiting. Normal bowel movements. No urinary symptoms.  Has been told she has "rheumatoid arthritis". Is insulin-dependent diabetic. His never been told she had gout.  A friend present shortly after her arrival and states that they found 2 empty wine bottles" at her apartment they assume she's been drinking more than usual and has been depressed recently.  Past Medical History  Diagnosis Date  . Arthritis   . Diabetes mellitus without complication   . Hypertension   . Hepatitis C    Past Surgical History  Procedure Laterality Date  . Tonsillectomy    . Joint replacement    . Abdominal hysterectomy    . Cholecystectomy     History reviewed. No pertinent family history. History  Substance Use Topics  . Smoking status: Never Smoker   . Smokeless tobacco: Not on file  . Alcohol Use: Yes   OB History    No data available     Review of Systems  Constitutional: Positive for appetite change. Negative for fever, chills, diaphoresis and fatigue.  HENT: Negative for mouth sores, sore throat and trouble swallowing.   Eyes: Negative for visual disturbance.  Respiratory: Negative for cough, chest tightness, shortness of breath and wheezing.   Cardiovascular: Negative for chest pain.  Gastrointestinal: Negative for nausea, vomiting, abdominal pain, diarrhea and abdominal distention.  Endocrine: Negative for polydipsia, polyphagia and polyuria.  Genitourinary: Negative for dysuria, frequency  and hematuria.  Musculoskeletal: Positive for back pain and arthralgias. Negative for gait problem.  Skin: Negative for color change, pallor and rash.  Neurological: Positive for weakness. Negative for dizziness, syncope, light-headedness and headaches.  Hematological: Does not bruise/bleed easily.  Psychiatric/Behavioral: Negative for behavioral problems and confusion.      Allergies  Review of patient's allergies indicates no known allergies.  Home Medications   Prior to Admission medications   Medication Sig Start Date End Date Taking? Authorizing Provider  B Complex-C (B-COMPLEX WITH VITAMIN C) tablet Take 1 tablet by mouth daily.   Yes Historical Provider, MD  bisacodyl (DULCOLAX) 5 MG EC tablet Take 5 mg by mouth once as needed for constipation.   Yes Historical Provider, MD  carvedilol (COREG) 3.125 MG tablet Take 1 tablet by mouth daily. 09/11/14  Yes Historical Provider, MD  cholecalciferol (VITAMIN D) 1000 UNITS tablet Take 5,000 Units by mouth daily.   Yes Historical Provider, MD  gabapentin (NEURONTIN) 600 MG tablet Take 3 tablets by mouth at bedtime. 08/26/14  Yes Historical Provider, MD  insulin aspart protamine-insulin aspart (NOVOLOG 70/30) (70-30) 100 UNIT/ML injection Inject 45 Units into the skin 2 (two) times daily with a meal. Depending on CGB   Yes Historical Provider, MD  KLOR-CON M10 10 MEQ tablet Take 1 tablet by mouth 3 (three) times a week. 07/28/14  Yes Historical Provider, MD  losartan (COZAAR) 100 MG tablet Take 100 mg by mouth at bedtime.   Yes Historical Provider, MD  meloxicam (MOBIC) 15 MG tablet Take 1 tablet by mouth daily. 08/25/14  Yes Historical Provider, MD  Multiple Vitamins-Minerals (MULTIVITAMIN WITH MINERALS) tablet Take 1 tablet by mouth daily.    Yes Historical Provider, MD  naproxen sodium (ANAPROX) 220 MG tablet Take 440 mg by mouth daily as needed (pain).    Yes Historical Provider, MD  simvastatin (ZOCOR) 10 MG tablet Take 1 tablet by mouth  daily. 09/04/14  Yes Historical Provider, MD  valsartan (DIOVAN) 160 MG tablet Take 1 tablet by mouth daily. 07/28/14  Yes Historical Provider, MD  cephALEXin (KEFLEX) 500 MG capsule Take 1 capsule (500 mg total) by mouth 4 (four) times daily. Patient not taking: Reported on 09/28/2014 10/17/12   [name], MD  ciprofloxacin (CILOXAN) 0.3 % ophthalmic solution Place 1 drop into the right eye every 4 (four) hours. Place one drop in effected eye every 4 hours until follow up with opthalmologist Patient not taking: Reported on 09/28/2014 06/20/14   Britt Bottom, NP  colchicine 0.6 MG tablet Take 1 tablet (0.6 mg total) by mouth daily. 09/28/14   Tanna Furry, MD  HYDROcodone-acetaminophen (NORCO/VICODIN) 5-325 MG per tablet Take 1 tablet by mouth every 4 (four) hours as needed. 09/28/14   Tanna Furry, MD  predniSONE (DELTASONE) 20 MG tablet Take 1 tablet (20 mg total) by mouth 2 (two) times daily with a meal. 09/28/14   Tanna Furry, MD  traMADol (ULTRAM) 50 MG tablet Take 1 tablet (50 mg total) by mouth every 6 (six) hours as needed. Patient not taking: Reported on 09/28/2014 06/20/14   Britt Bottom, NP   BP 154/56 mmHg  Pulse 93  Temp(Src) 98.1 F (36.7 C) (Oral)  Resp 14  SpO2 94% Physical Exam  Constitutional: She is oriented to person, place, and time. She appears well-developed and well-nourished. No distress.  Obese black female in no acute distress.  HENT:  Head: Normocephalic.  Eyes: Conjunctivae are normal. Pupils are equal, round, and reactive to light. No scleral icterus.  Neck: Normal range of motion. Neck supple. No thyromegaly present.  Cardiovascular: Normal rate and regular rhythm.  Exam reveals no gallop and no friction rub.   No murmur heard. Pulmonary/Chest: Effort normal and breath sounds normal. No respiratory distress. She has no wheezes. She has no rales.    Abdominal: Soft. Bowel sounds are normal. She exhibits no distension. There is no tenderness. There is no rebound.    Musculoskeletal: Normal range of motion.       Feet:  Neurological: She is alert and oriented to person, place, and time.  Skin: Skin is warm and dry. No rash noted.  Psychiatric: She has a normal mood and affect. Her behavior is normal.    ED Course  Procedures (including critical care time) Labs Review Labs Reviewed  CBC WITH DIFFERENTIAL/PLATELET - Abnormal; Notable for the following:    WBC 13.6 (*)    HCT 35.9 (*)    Neutro Abs 10.2 (*)    Monocytes Absolute 1.4 (*)    All other components within normal limits  BASIC METABOLIC PANEL - Abnormal; Notable for the following:    Glucose, Bld 164 (*)    Creatinine, Ser 1.07 (*)    GFR calc non Af Amer 51 (*)    GFR calc Af Amer 60 (*)    All other components within normal limits  SEDIMENTATION RATE - Abnormal; Notable for the following:    Sed Rate 54 (*)    All other components within normal  limits  URIC ACID - Abnormal; Notable for the following:    Uric Acid, Serum 9.8 (*)    All other components within normal limits  ETHANOL  URINALYSIS, ROUTINE W REFLEX MICROSCOPIC (NOT AT [facility])    Imaging Review Dg Chest 2 View  09/28/2014   CLINICAL DATA:  69 year old female with history of right-sided posterior chest pain and right-sided foot pain.  EXAM: CHEST  2 VIEW  COMPARISON:  Chest x-ray 10/17/2012.  FINDINGS: Lung volumes are normal. No consolidative airspace disease. No pleural effusions. No pneumothorax. No pulmonary nodule or mass noted. Pulmonary vasculature and the cardiomediastinal silhouette are within normal limits. Atherosclerosis in the thoracic aorta.  IMPRESSION: 1. No radiographic evidence of acute cardiopulmonary disease. 2. Atherosclerosis.   Electronically Signed   By: Vinnie Langton M.D.   On: 09/28/2014 20:51   Dg Foot Complete Right  09/28/2014   CLINICAL DATA:  Right foot pain beginning 09/26/2014. No known injury. Initial encounter.  EXAM: RIGHT FOOT COMPLETE - 3+ VIEW  COMPARISON:  None.  FINDINGS: No  acute bony or joint abnormality is identified. Midfoot and first MTP osteoarthritis is identified. Soft tissues are unremarkable.  IMPRESSION: No acute abnormality.  Midfoot and first MTP osteoarthritis.   Electronically Signed   By: Inge Rise M.D.   On: 09/28/2014 20:49     EKG Interpretation None      MDM   Final diagnoses:  Right-sided chest pain  Acute gout of right foot, unspecified cause    Elevated sedimentation rate. Elevated uric acid. Tender painful toe and foot. All symptoms consistent with gout. I discussed her alcohol use with her. States she has occasional Marguerita and admits that she has across her 2 of wine over the last several days. She feels comfortable that she can does not drink. She does see a counselor because of some stress within her family and feels like she is not depressed and is otherwise functioning well. She is appropriate for home treatment.    Tanna Furry, MD 09/28/14 [phone]

## 2014-11-06 ENCOUNTER — Ambulatory Visit
Admission: RE | Admit: 2014-11-06 | Discharge: 2014-11-06 | Disposition: A | Discharge status: Home / Self Care | Payer: Medicare HMO | Source: Ambulatory Visit

## 2014-11-06 DIAGNOSIS — Z1231 Encounter for screening mammogram for malignant neoplasm of breast: Secondary | ICD-10-CM

## 2014-11-28 DIAGNOSIS — E039 Hypothyroidism, unspecified: Secondary | ICD-10-CM | POA: Diagnosis not present

## 2014-11-28 DIAGNOSIS — I129 Hypertensive chronic kidney disease with stage 1 through stage 4 chronic kidney disease, or unspecified chronic kidney disease: Secondary | ICD-10-CM | POA: Diagnosis not present

## 2014-11-28 DIAGNOSIS — Z23 Encounter for immunization: Secondary | ICD-10-CM | POA: Diagnosis not present

## 2014-11-28 DIAGNOSIS — N183 Chronic kidney disease, stage 3 (moderate): Secondary | ICD-10-CM | POA: Diagnosis not present

## 2014-12-04 DIAGNOSIS — M25561 Pain in right knee: Secondary | ICD-10-CM | POA: Diagnosis not present

## 2014-12-15 DIAGNOSIS — E119 Type 2 diabetes mellitus without complications: Secondary | ICD-10-CM | POA: Diagnosis not present

## 2014-12-18 DIAGNOSIS — M79672 Pain in left foot: Secondary | ICD-10-CM | POA: Diagnosis not present

## 2014-12-22 DIAGNOSIS — E119 Type 2 diabetes mellitus without complications: Secondary | ICD-10-CM | POA: Diagnosis not present

## 2015-01-17 DIAGNOSIS — N08 Glomerular disorders in diseases classified elsewhere: Secondary | ICD-10-CM | POA: Diagnosis not present

## 2015-01-17 DIAGNOSIS — E1122 Type 2 diabetes mellitus with diabetic chronic kidney disease: Secondary | ICD-10-CM | POA: Diagnosis not present

## 2015-01-17 DIAGNOSIS — N183 Chronic kidney disease, stage 3 (moderate): Secondary | ICD-10-CM | POA: Diagnosis not present

## 2015-01-17 DIAGNOSIS — I129 Hypertensive chronic kidney disease with stage 1 through stage 4 chronic kidney disease, or unspecified chronic kidney disease: Secondary | ICD-10-CM | POA: Diagnosis not present

## 2015-01-22 DIAGNOSIS — M25561 Pain in right knee: Secondary | ICD-10-CM | POA: Diagnosis not present

## 2015-01-25 DIAGNOSIS — E119 Type 2 diabetes mellitus without complications: Secondary | ICD-10-CM | POA: Diagnosis not present

## 2015-01-29 DIAGNOSIS — M79672 Pain in left foot: Secondary | ICD-10-CM | POA: Diagnosis not present

## 2015-01-29 DIAGNOSIS — M7061 Trochanteric bursitis, right hip: Secondary | ICD-10-CM | POA: Diagnosis not present

## 2015-01-30 DIAGNOSIS — R262 Difficulty in walking, not elsewhere classified: Secondary | ICD-10-CM | POA: Diagnosis not present

## 2015-01-30 DIAGNOSIS — M1711 Unilateral primary osteoarthritis, right knee: Secondary | ICD-10-CM | POA: Diagnosis not present

## 2015-01-30 DIAGNOSIS — M25561 Pain in right knee: Secondary | ICD-10-CM | POA: Diagnosis not present

## 2015-02-09 DIAGNOSIS — M1711 Unilateral primary osteoarthritis, right knee: Secondary | ICD-10-CM | POA: Diagnosis not present

## 2015-02-09 DIAGNOSIS — M25561 Pain in right knee: Secondary | ICD-10-CM | POA: Diagnosis not present

## 2015-02-09 DIAGNOSIS — R262 Difficulty in walking, not elsewhere classified: Secondary | ICD-10-CM | POA: Diagnosis not present

## 2015-02-21 DIAGNOSIS — M1711 Unilateral primary osteoarthritis, right knee: Secondary | ICD-10-CM | POA: Diagnosis not present

## 2015-02-21 DIAGNOSIS — M25561 Pain in right knee: Secondary | ICD-10-CM | POA: Diagnosis not present

## 2015-02-28 ENCOUNTER — Other Ambulatory Visit: Payer: Self-pay | Admitting: Nurse Practitioner

## 2015-02-28 DIAGNOSIS — D1803 Hemangioma of intra-abdominal structures: Secondary | ICD-10-CM | POA: Diagnosis not present

## 2015-02-28 DIAGNOSIS — K740 Hepatic fibrosis: Secondary | ICD-10-CM | POA: Diagnosis not present

## 2015-03-07 DIAGNOSIS — M1711 Unilateral primary osteoarthritis, right knee: Secondary | ICD-10-CM | POA: Diagnosis not present

## 2015-03-07 DIAGNOSIS — R2689 Other abnormalities of gait and mobility: Secondary | ICD-10-CM | POA: Diagnosis not present

## 2015-03-07 DIAGNOSIS — M25561 Pain in right knee: Secondary | ICD-10-CM | POA: Diagnosis not present

## 2015-03-14 DIAGNOSIS — M25561 Pain in right knee: Secondary | ICD-10-CM | POA: Diagnosis not present

## 2015-03-14 DIAGNOSIS — R2689 Other abnormalities of gait and mobility: Secondary | ICD-10-CM | POA: Diagnosis not present

## 2015-03-14 DIAGNOSIS — M1711 Unilateral primary osteoarthritis, right knee: Secondary | ICD-10-CM | POA: Diagnosis not present

## 2015-03-16 DIAGNOSIS — R2689 Other abnormalities of gait and mobility: Secondary | ICD-10-CM | POA: Diagnosis not present

## 2015-03-16 DIAGNOSIS — M1711 Unilateral primary osteoarthritis, right knee: Secondary | ICD-10-CM | POA: Diagnosis not present

## 2015-03-16 DIAGNOSIS — M25561 Pain in right knee: Secondary | ICD-10-CM | POA: Diagnosis not present

## 2015-03-19 DIAGNOSIS — M1711 Unilateral primary osteoarthritis, right knee: Secondary | ICD-10-CM | POA: Diagnosis not present

## 2015-03-19 DIAGNOSIS — R2689 Other abnormalities of gait and mobility: Secondary | ICD-10-CM | POA: Diagnosis not present

## 2015-03-19 DIAGNOSIS — M25561 Pain in right knee: Secondary | ICD-10-CM | POA: Diagnosis not present

## 2015-03-20 DIAGNOSIS — E119 Type 2 diabetes mellitus without complications: Secondary | ICD-10-CM | POA: Diagnosis not present

## 2015-03-22 DIAGNOSIS — M1711 Unilateral primary osteoarthritis, right knee: Secondary | ICD-10-CM | POA: Diagnosis not present

## 2015-03-22 DIAGNOSIS — R2689 Other abnormalities of gait and mobility: Secondary | ICD-10-CM | POA: Diagnosis not present

## 2015-03-22 DIAGNOSIS — M25561 Pain in right knee: Secondary | ICD-10-CM | POA: Diagnosis not present

## 2015-03-29 DIAGNOSIS — R2689 Other abnormalities of gait and mobility: Secondary | ICD-10-CM | POA: Diagnosis not present

## 2015-03-29 DIAGNOSIS — M1711 Unilateral primary osteoarthritis, right knee: Secondary | ICD-10-CM | POA: Diagnosis not present

## 2015-03-29 DIAGNOSIS — M25561 Pain in right knee: Secondary | ICD-10-CM | POA: Diagnosis not present

## 2015-04-02 DIAGNOSIS — N183 Chronic kidney disease, stage 3 (moderate): Secondary | ICD-10-CM | POA: Diagnosis not present

## 2015-04-02 DIAGNOSIS — E1122 Type 2 diabetes mellitus with diabetic chronic kidney disease: Secondary | ICD-10-CM | POA: Diagnosis not present

## 2015-04-02 DIAGNOSIS — M25561 Pain in right knee: Secondary | ICD-10-CM | POA: Diagnosis not present

## 2015-04-02 DIAGNOSIS — I129 Hypertensive chronic kidney disease with stage 1 through stage 4 chronic kidney disease, or unspecified chronic kidney disease: Secondary | ICD-10-CM | POA: Diagnosis not present

## 2015-04-02 DIAGNOSIS — M1711 Unilateral primary osteoarthritis, right knee: Secondary | ICD-10-CM | POA: Diagnosis not present

## 2015-04-02 DIAGNOSIS — N08 Glomerular disorders in diseases classified elsewhere: Secondary | ICD-10-CM | POA: Diagnosis not present

## 2015-04-02 DIAGNOSIS — R2689 Other abnormalities of gait and mobility: Secondary | ICD-10-CM | POA: Diagnosis not present

## 2015-04-09 DIAGNOSIS — M1711 Unilateral primary osteoarthritis, right knee: Secondary | ICD-10-CM | POA: Diagnosis not present

## 2015-04-09 DIAGNOSIS — R2689 Other abnormalities of gait and mobility: Secondary | ICD-10-CM | POA: Diagnosis not present

## 2015-04-09 DIAGNOSIS — M25561 Pain in right knee: Secondary | ICD-10-CM | POA: Diagnosis not present

## 2015-04-11 DIAGNOSIS — E039 Hypothyroidism, unspecified: Secondary | ICD-10-CM | POA: Diagnosis not present

## 2015-04-11 DIAGNOSIS — E1142 Type 2 diabetes mellitus with diabetic polyneuropathy: Secondary | ICD-10-CM | POA: Diagnosis not present

## 2015-04-11 DIAGNOSIS — Z794 Long term (current) use of insulin: Secondary | ICD-10-CM | POA: Diagnosis not present

## 2015-04-11 DIAGNOSIS — K5900 Constipation, unspecified: Secondary | ICD-10-CM | POA: Diagnosis not present

## 2015-04-11 DIAGNOSIS — E1165 Type 2 diabetes mellitus with hyperglycemia: Secondary | ICD-10-CM | POA: Diagnosis not present

## 2015-04-11 DIAGNOSIS — M109 Gout, unspecified: Secondary | ICD-10-CM | POA: Diagnosis not present

## 2015-04-11 DIAGNOSIS — E785 Hyperlipidemia, unspecified: Secondary | ICD-10-CM | POA: Diagnosis not present

## 2015-04-11 DIAGNOSIS — Z6841 Body Mass Index (BMI) 40.0 and over, adult: Secondary | ICD-10-CM | POA: Diagnosis not present

## 2015-04-11 DIAGNOSIS — I10 Essential (primary) hypertension: Secondary | ICD-10-CM | POA: Diagnosis not present

## 2015-04-12 DIAGNOSIS — M25561 Pain in right knee: Secondary | ICD-10-CM | POA: Diagnosis not present

## 2015-04-12 DIAGNOSIS — M1711 Unilateral primary osteoarthritis, right knee: Secondary | ICD-10-CM | POA: Diagnosis not present

## 2015-04-12 DIAGNOSIS — R2689 Other abnormalities of gait and mobility: Secondary | ICD-10-CM | POA: Diagnosis not present

## 2015-04-16 DIAGNOSIS — M25562 Pain in left knee: Secondary | ICD-10-CM | POA: Diagnosis not present

## 2015-04-16 DIAGNOSIS — M25561 Pain in right knee: Secondary | ICD-10-CM | POA: Diagnosis not present

## 2015-04-16 DIAGNOSIS — R2689 Other abnormalities of gait and mobility: Secondary | ICD-10-CM | POA: Diagnosis not present

## 2015-04-16 DIAGNOSIS — M1711 Unilateral primary osteoarthritis, right knee: Secondary | ICD-10-CM | POA: Diagnosis not present

## 2015-04-17 DIAGNOSIS — I129 Hypertensive chronic kidney disease with stage 1 through stage 4 chronic kidney disease, or unspecified chronic kidney disease: Secondary | ICD-10-CM | POA: Diagnosis not present

## 2015-04-17 DIAGNOSIS — N08 Glomerular disorders in diseases classified elsewhere: Secondary | ICD-10-CM | POA: Diagnosis not present

## 2015-04-17 DIAGNOSIS — E1122 Type 2 diabetes mellitus with diabetic chronic kidney disease: Secondary | ICD-10-CM | POA: Diagnosis not present

## 2015-04-17 DIAGNOSIS — N182 Chronic kidney disease, stage 2 (mild): Secondary | ICD-10-CM | POA: Diagnosis not present

## 2015-04-19 DIAGNOSIS — M25561 Pain in right knee: Secondary | ICD-10-CM | POA: Diagnosis not present

## 2015-04-19 DIAGNOSIS — M1711 Unilateral primary osteoarthritis, right knee: Secondary | ICD-10-CM | POA: Diagnosis not present

## 2015-04-19 DIAGNOSIS — R2689 Other abnormalities of gait and mobility: Secondary | ICD-10-CM | POA: Diagnosis not present

## 2015-04-23 DIAGNOSIS — M25561 Pain in right knee: Secondary | ICD-10-CM | POA: Diagnosis not present

## 2015-04-23 DIAGNOSIS — M25562 Pain in left knee: Secondary | ICD-10-CM | POA: Diagnosis not present

## 2015-04-26 DIAGNOSIS — R2689 Other abnormalities of gait and mobility: Secondary | ICD-10-CM | POA: Diagnosis not present

## 2015-04-26 DIAGNOSIS — M1711 Unilateral primary osteoarthritis, right knee: Secondary | ICD-10-CM | POA: Diagnosis not present

## 2015-04-26 DIAGNOSIS — M25561 Pain in right knee: Secondary | ICD-10-CM | POA: Diagnosis not present

## 2015-04-30 DIAGNOSIS — R2689 Other abnormalities of gait and mobility: Secondary | ICD-10-CM | POA: Diagnosis not present

## 2015-04-30 DIAGNOSIS — M1711 Unilateral primary osteoarthritis, right knee: Secondary | ICD-10-CM | POA: Diagnosis not present

## 2015-04-30 DIAGNOSIS — M25561 Pain in right knee: Secondary | ICD-10-CM | POA: Diagnosis not present

## 2015-05-02 DIAGNOSIS — L602 Onychogryphosis: Secondary | ICD-10-CM | POA: Diagnosis not present

## 2015-05-02 DIAGNOSIS — M2011 Hallux valgus (acquired), right foot: Secondary | ICD-10-CM | POA: Diagnosis not present

## 2015-05-02 DIAGNOSIS — E119 Type 2 diabetes mellitus without complications: Secondary | ICD-10-CM | POA: Diagnosis not present

## 2015-05-09 DIAGNOSIS — E119 Type 2 diabetes mellitus without complications: Secondary | ICD-10-CM | POA: Diagnosis not present

## 2015-05-10 DIAGNOSIS — Z0100 Encounter for examination of eyes and vision without abnormal findings: Secondary | ICD-10-CM | POA: Diagnosis not present

## 2015-06-05 DIAGNOSIS — G894 Chronic pain syndrome: Secondary | ICD-10-CM | POA: Diagnosis not present

## 2015-06-05 DIAGNOSIS — R413 Other amnesia: Secondary | ICD-10-CM | POA: Diagnosis not present

## 2015-06-05 DIAGNOSIS — E039 Hypothyroidism, unspecified: Secondary | ICD-10-CM | POA: Diagnosis not present

## 2015-06-05 DIAGNOSIS — M25569 Pain in unspecified knee: Secondary | ICD-10-CM | POA: Diagnosis not present

## 2015-06-05 DIAGNOSIS — R5383 Other fatigue: Secondary | ICD-10-CM | POA: Diagnosis not present

## 2015-06-05 DIAGNOSIS — R143 Flatulence: Secondary | ICD-10-CM | POA: Diagnosis not present

## 2015-06-05 DIAGNOSIS — N182 Chronic kidney disease, stage 2 (mild): Secondary | ICD-10-CM | POA: Diagnosis not present

## 2015-06-05 DIAGNOSIS — I129 Hypertensive chronic kidney disease with stage 1 through stage 4 chronic kidney disease, or unspecified chronic kidney disease: Secondary | ICD-10-CM | POA: Diagnosis not present

## 2015-06-09 ENCOUNTER — Emergency Department (HOSPITAL_COMMUNITY): Payer: Medicare HMO

## 2015-06-09 ENCOUNTER — Encounter (HOSPITAL_COMMUNITY): Payer: Self-pay | Admitting: Emergency Medicine

## 2015-06-09 ENCOUNTER — Emergency Department (HOSPITAL_COMMUNITY)
Admission: EM | Admit: 2015-06-09 | Discharge: 2015-06-09 | Disposition: A | Discharge status: Home / Self Care | Payer: Medicare HMO | Attending: Emergency Medicine | Admitting: Emergency Medicine

## 2015-06-09 DIAGNOSIS — R6883 Chills (without fever): Secondary | ICD-10-CM | POA: Diagnosis not present

## 2015-06-09 DIAGNOSIS — Z7952 Long term (current) use of systemic steroids: Secondary | ICD-10-CM | POA: Diagnosis not present

## 2015-06-09 DIAGNOSIS — I10 Essential (primary) hypertension: Secondary | ICD-10-CM | POA: Diagnosis not present

## 2015-06-09 DIAGNOSIS — Z8619 Personal history of other infectious and parasitic diseases: Secondary | ICD-10-CM | POA: Insufficient documentation

## 2015-06-09 DIAGNOSIS — R110 Nausea: Secondary | ICD-10-CM | POA: Diagnosis not present

## 2015-06-09 DIAGNOSIS — R531 Weakness: Secondary | ICD-10-CM | POA: Diagnosis not present

## 2015-06-09 DIAGNOSIS — Z794 Long term (current) use of insulin: Secondary | ICD-10-CM | POA: Diagnosis not present

## 2015-06-09 DIAGNOSIS — M1990 Unspecified osteoarthritis, unspecified site: Secondary | ICD-10-CM | POA: Insufficient documentation

## 2015-06-09 DIAGNOSIS — T4275XA Adverse effect of unspecified antiepileptic and sedative-hypnotic drugs, initial encounter: Secondary | ICD-10-CM | POA: Diagnosis not present

## 2015-06-09 DIAGNOSIS — Z79899 Other long term (current) drug therapy: Secondary | ICD-10-CM | POA: Diagnosis not present

## 2015-06-09 DIAGNOSIS — R5383 Other fatigue: Secondary | ICD-10-CM | POA: Diagnosis not present

## 2015-06-09 DIAGNOSIS — E119 Type 2 diabetes mellitus without complications: Secondary | ICD-10-CM | POA: Diagnosis not present

## 2015-06-09 DIAGNOSIS — T43215A Adverse effect of selective serotonin and norepinephrine reuptake inhibitors, initial encounter: Secondary | ICD-10-CM | POA: Insufficient documentation

## 2015-06-09 DIAGNOSIS — R1084 Generalized abdominal pain: Secondary | ICD-10-CM | POA: Diagnosis not present

## 2015-06-09 DIAGNOSIS — T50905A Adverse effect of unspecified drugs, medicaments and biological substances, initial encounter: Secondary | ICD-10-CM

## 2015-06-09 LAB — CBC WITH DIFFERENTIAL/PLATELET
Basophils Absolute: 0 10*3/uL (ref 0.0–0.1)
Basophils Relative: 0 %
EOS PCT: 3 %
Eosinophils Absolute: 0.3 10*3/uL (ref 0.0–0.7)
HCT: 38.3 % (ref 36.0–46.0)
Hemoglobin: 12.9 g/dL (ref 12.0–15.0)
LYMPHS ABS: 2.9 10*3/uL (ref 0.7–4.0)
LYMPHS PCT: 28 %
MCH: 28.1 pg (ref 26.0–34.0)
MCHC: 33.7 g/dL (ref 30.0–36.0)
MCV: 83.4 fL (ref 78.0–100.0)
MONO ABS: 1.1 10*3/uL — AB (ref 0.1–1.0)
MONOS PCT: 10 %
Neutro Abs: 6 10*3/uL (ref 1.7–7.7)
Neutrophils Relative %: 59 %
PLATELETS: 180 10*3/uL (ref 150–400)
RBC: 4.59 MIL/uL (ref 3.87–5.11)
RDW: 14.9 % (ref 11.5–15.5)
WBC: 10.3 10*3/uL (ref 4.0–10.5)

## 2015-06-09 LAB — COMPREHENSIVE METABOLIC PANEL
ALT: 21 U/L (ref 14–54)
AST: 30 U/L (ref 15–41)
Albumin: 3.7 g/dL (ref 3.5–5.0)
Alkaline Phosphatase: 56 U/L (ref 38–126)
Anion gap: 12 (ref 5–15)
BUN: 15 mg/dL (ref 6–20)
CHLORIDE: 103 mmol/L (ref 101–111)
CO2: 22 mmol/L (ref 22–32)
Calcium: 9.8 mg/dL (ref 8.9–10.3)
Creatinine, Ser: 1.07 mg/dL — ABNORMAL HIGH (ref 0.44–1.00)
GFR calc Af Amer: 59 mL/min — ABNORMAL LOW (ref >60)
GFR calc non Af Amer: 51 mL/min — ABNORMAL LOW (ref >60)
GLUCOSE: 111 mg/dL — AB (ref 65–99)
Potassium: 3.8 mmol/L (ref 3.5–5.1)
Sodium: 137 mmol/L (ref 135–145)
Total Bilirubin: 0.7 mg/dL (ref 0.3–1.2)
Total Protein: 7.5 g/dL (ref 6.5–8.1)

## 2015-06-09 LAB — I-STAT TROPONIN, ED: Troponin i, poc: 0.01 ng/mL (ref 0.00–0.08)

## 2015-06-09 LAB — URINALYSIS, ROUTINE W REFLEX MICROSCOPIC
Bilirubin Urine: NEGATIVE
GLUCOSE, UA: NEGATIVE mg/dL
HGB URINE DIPSTICK: NEGATIVE
Ketones, ur: NEGATIVE mg/dL
Leukocytes, UA: NEGATIVE
Nitrite: NEGATIVE
Protein, ur: NEGATIVE mg/dL
SPECIFIC GRAVITY, URINE: 1.017 (ref 1.005–1.030)
pH: 5 (ref 5.0–8.0)

## 2015-06-09 LAB — LIPASE, BLOOD: Lipase: 20 U/L (ref 11–51)

## 2015-06-09 LAB — CK: CK TOTAL: 319 U/L — AB (ref 38–234)

## 2015-06-09 MED ORDER — LOSARTAN POTASSIUM 50 MG PO TABS
100.0000 mg | ORAL_TABLET | Freq: Once | ORAL | Status: DC
  Filled 2015-06-09: qty 2

## 2015-06-09 MED ORDER — DICYCLOMINE HCL 10 MG PO CAPS
10.0000 mg | ORAL_CAPSULE | Freq: Once | ORAL | Status: AC
  Administered 2015-06-09: 10 mg via ORAL
  Filled 2015-06-09: qty 1

## 2015-06-09 MED ORDER — GI COCKTAIL ~~LOC~~
30.0000 mL | Freq: Once | ORAL | Status: AC
Start: 2015-06-09 — End: 2015-06-09
  Administered 2015-06-09: 30 mL via ORAL
  Filled 2015-06-09: qty 30

## 2015-06-09 MED ORDER — SODIUM CHLORIDE 0.9 % IV BOLUS (SEPSIS): 1000.0000 mL | Freq: Once | INTRAVENOUS | Status: DC

## 2015-06-09 NOTE — ED Provider Notes (Signed)
CSN: [id number]     Arrival date & time 06/09/15  1452 History   First MD Initiated Contact with Patient 06/09/15 1646     Chief Complaint  Patient presents with  . Nausea  . Fatigue     (Consider location/radiation/quality/duration/timing/severity/associated sxs/prior Treatment) HPI  70 year old female with past medical history of hypertension and diabetes who presents with general abdominal discomfort and bloating after starting duloxetine 30 mg. The patient states she was taken off of gabapentin abruptly and placed on duloxetine several days ago. After her first dose of duloxetine, she had gradual onset of diffuse abdominal cramping and bloating with nausea. She has had no vomiting. She has also had difficulty sleeping since the medication change. The symptoms seem to worsen several hours after taking her duloxetine and improved just before it's time for her next dose. She subsequently presents for evaluation. She has a history of GI upset while starting other medications in the past. Denies any fevers or chills. No diarrhea. No cough sputum production or shortness of breath. Denies any chest pain.  Past Medical History  Diagnosis Date  . Arthritis   . Diabetes mellitus without complication ([place])   . Hypertension   . Hepatitis C    Past Surgical History  Procedure Laterality Date  . Tonsillectomy    . Joint replacement    . Abdominal hysterectomy    . Cholecystectomy     History reviewed. No pertinent family history. Social History  Substance Use Topics  . Smoking status: Never Smoker   . Smokeless tobacco: None  . Alcohol Use: Yes   OB History    No data available     Review of Systems  Constitutional: Positive for chills and fatigue. Negative for fever.  HENT: Negative for congestion and rhinorrhea.   Eyes: Negative for visual disturbance.  Respiratory: Negative for cough, shortness of breath and wheezing.   Cardiovascular: Negative for chest pain and leg swelling.   Gastrointestinal: Positive for nausea and abdominal pain. Negative for vomiting and constipation.  Genitourinary: Negative for dysuria and flank pain.  Musculoskeletal: Negative for neck pain and neck stiffness.  Skin: Negative for rash.  Allergic/Immunologic: Negative for immunocompromised state.  Neurological: Negative for syncope, weakness and headaches.      Allergies  Review of patient's allergies indicates no known allergies.  Home Medications   Prior to Admission medications   Medication Sig Start Date End Date Taking? Authorizing Provider  B Complex-C (B-COMPLEX WITH VITAMIN C) tablet Take 1 tablet by mouth daily.   Yes Historical Provider, MD  carvedilol (COREG) 3.125 MG tablet Take 2 tablets by mouth every evening.  09/11/14  Yes Historical Provider, MD  Cholecalciferol 5000 units TABS Take 5,000 Units by mouth daily.   Yes Historical Provider, MD  colchicine 0.6 MG tablet Take 1 tablet (0.6 mg total) by mouth daily. Patient taking differently: Take 0.6 mg by mouth daily as needed (FOR FLAREUPS).  09/28/14  Yes Tanna Furry, MD  docusate sodium (COLACE) 100 MG capsule Take 100 mg by mouth daily as needed for mild constipation.   Yes Historical Provider, MD  DULoxetine (CYMBALTA) 30 MG capsule Take 30 mg by mouth every evening.   Yes Historical Provider, MD  insulin aspart (NOVOLOG) 100 UNIT/ML injection Inject 0-8 Units into the skin 3 (three) times daily before meals. SLIDING SCALE   Yes Historical Provider, MD  insulin aspart protamine-insulin aspart (NOVOLOG 70/30) (70-30) 100 UNIT/ML injection Inject 45 Units into the skin 2 (two) times daily  with a meal. Depending on CGB   Yes Historical Provider, MD  levothyroxine (SYNTHROID, LEVOTHROID) 25 MCG tablet Take 12.5 mcg by mouth daily before breakfast.   Yes Historical Provider, MD  Multiple Vitamins-Minerals (MULTIVITAMIN WITH MINERALS) tablet Take 1 tablet by mouth daily.    Yes Historical Provider, MD  simvastatin (ZOCOR) 10 MG  tablet Take 10 mg by mouth daily at 6 PM.  09/04/14  Yes Historical Provider, MD  valsartan (DIOVAN) 160 MG tablet Take 1 tablet by mouth at bedtime.  07/28/14  Yes Historical Provider, MD  cephALEXin (KEFLEX) 500 MG capsule Take 1 capsule (500 mg total) by mouth 4 (four) times daily. Patient not taking: Reported on 09/28/2014 10/17/12   [name], MD  cholecalciferol (VITAMIN D) 1000 UNITS tablet Take 5,000 Units by mouth daily.    Historical Provider, MD  ciprofloxacin (CILOXAN) 0.3 % ophthalmic solution Place 1 drop into the right eye every 4 (four) hours. Place one drop in effected eye every 4 hours until follow up with opthalmologist Patient not taking: Reported on 09/28/2014 06/20/14   Britt Bottom, NP  HYDROcodone-acetaminophen (NORCO/VICODIN) 5-325 MG per tablet Take 1 tablet by mouth every 4 (four) hours as needed. 09/28/14   Tanna Furry, MD  predniSONE (DELTASONE) 20 MG tablet Take 1 tablet (20 mg total) by mouth 2 (two) times daily with a meal. 09/28/14   Tanna Furry, MD  traMADol (ULTRAM) 50 MG tablet Take 1 tablet (50 mg total) by mouth every 6 (six) hours as needed. Patient not taking: Reported on 09/28/2014 06/20/14   Britt Bottom, NP   BP 179/83 mmHg  Pulse 61  Temp(Src) 97.7 F (36.5 C) (Oral)  Resp 18  SpO2 98% Physical Exam  Constitutional: She is oriented to person, place, and time. She appears well-developed and well-nourished. No distress.  HENT:  Head: Normocephalic and atraumatic.  Mouth/Throat: No oropharyngeal exudate.  Eyes: Conjunctivae are normal. Pupils are equal, round, and reactive to light.  Neck: Normal range of motion.  Cardiovascular: Normal rate, regular rhythm, normal heart sounds and intact distal pulses.  Exam reveals no friction rub.   No murmur heard. Pulmonary/Chest: Effort normal and breath sounds normal. No respiratory distress. She has no wheezes. She has no rales.  Abdominal: Bowel sounds are normal. She exhibits no distension. There is no  tenderness. There is no rebound and no guarding.  Musculoskeletal: She exhibits no edema.  Neurological: She is alert and oriented to person, place, and time.  Skin: Skin is warm. No rash noted.  Nursing note and vitals reviewed.   ED Course  Procedures (including critical care time) Labs Review Labs Reviewed  CBC WITH DIFFERENTIAL/PLATELET - Abnormal; Notable for the following:    Monocytes Absolute 1.1 (*)    All other components within normal limits  COMPREHENSIVE METABOLIC PANEL - Abnormal; Notable for the following:    Glucose, Bld 111 (*)    Creatinine, Ser 1.07 (*)    GFR calc non Af Amer 51 (*)    GFR calc Af Amer 59 (*)    All other components within normal limits  URINALYSIS, ROUTINE W REFLEX MICROSCOPIC (NOT AT [facility]) - Abnormal; Notable for the following:    APPearance CLOUDY (*)    All other components within normal limits  CK - Abnormal; Notable for the following:    Total CK 319 (*)    All other components within normal limits  URINE CULTURE  LIPASE, BLOOD  I-STAT TROPOININ, ED    Imaging Review  Dg Chest 2 View  06/09/2015  CLINICAL DATA:  Nausea and weakness.  Muscle aches. EXAM: CHEST  2 VIEW COMPARISON:  Chest radiograph 09/28/2014 FINDINGS: Stable enlarged cardiac and mediastinal contours. Elevation right hemidiaphragm. No large area of pulmonary consolidation. No pleural effusion or pneumothorax. Bilateral shoulder joint degenerative changes. Thoracic spine degenerative changes. IMPRESSION: No acute cardiopulmonary process.  Low lung volumes. Electronically Signed   By: Lovey Newcomer M.D.   On: 06/09/2015 18:37   I have personally reviewed and evaluated these images and lab results as part of my medical decision-making.   EKG Interpretation   Date/Time:  Saturday June 09 2015 17:18:02 EDT Ventricular Rate:  56 PR Interval:  145 QRS Duration: 91 QT Interval:  434 QTC Calculation: 419 R Axis:   39 Text Interpretation:  Sinus rhythm Minimal ST depression,  inferior leads  ED PHYSICIAN INTERPRETATION AVAILABLE IN [facility] Confirmed by  TEST, Record ([id number]) on 06/10/2015 7:53:51 AM      MDM  70 year old female with past medical history of hypertension and diabetes who presents with general abdominal bloating and nausea after starting duloxetine. See history of present illness above. On arrival, the patient is afebrile and HDS. Abdomen is completely soft nontender and nondistended. I suspect the patient's symptoms are secondary to adverse effect of duloxetine as this is a well-known and very common reaction when starting the medication. She started on 30 mg which is associated with GI side effects. Otherwise, her abdomen is completely soft and nontender and she has no evidence to suggest acute intra-abdominal pathology. A right upper quadrant tenderness vomiting or evidence of cholecystitis. No right lower quadrant tenderness or signs of appendicitis. EKG is nonischemic. She denies any chest pain, and screening troponin is negative, making ACS or cardiac etiology unlikely. Chest x-ray shows no pneumonia or other abnormality. Denies any fever or infectious symptoms. Will send screening labs and give Bentyl and GI cocktail.  Labs and imaging reviewed as above. CBC shows no leukocytosis or anemia. CMP shows baseline renal function. CK 319. She does endorse poor by mouth intake but has no evidence of rhabdomyolysis. Will encourage IV fluids. Otherwise, urinalysis shows no signs of UTI. Symptoms are improved with GI cocktail and Bentyl. Discussed likely diagnosis with the patient who is in agreement that her symptoms directly correlate with starting duloxetine. Will advise her to discontinue this as well as restart her gabapentin as it may be component of withdrawal. Patient is in agreement with this plan. Strict return precautions were given in detail.  Clinical Impression: 1. Medication side effect, initial encounter     Disposition:  Discharge  Condition: Good  I have discussed the results, Dx and Tx plan with the pt(& family if present). He/she/they expressed understanding and agree(s) with the plan. Discharge instructions discussed at great length. Strict return precautions discussed and pt &/or family have verbalized understanding of the instructions. No further questions at time of discharge.    Discharge Medication List as of 06/09/2015  7:22 PM      Follow Up: Glendale Chard, MD [address] [place] [address] [phone]   Called to discuss your medication changes and recent emergency department visit with follow-up in one week.   Pt seen in conjunction with Dr. Wilmer Floor, MD 06/10/15 [place], MD 06/11/15 [id number]

## 2015-06-09 NOTE — Discharge Instructions (Signed)
-   Start taking your gabapentin again and call your doctor to discuss your recent medication reaction and alternatives. Stop taking your duloxetine.

## 2015-06-09 NOTE — ED Notes (Signed)
Patient transported to X-ray 

## 2015-06-09 NOTE — ED Notes (Signed)
Went to PCP on Tuesday and changed from Gabapentin for neuropathy to Duloxetine. Started taking Duloxetine on Tuesday. Reports feeling achy all over and nauseated since Tuesday. Reports muscles ache and nausea has increased every day. She expected to have nausea with the medicine change, but concerned about the achyness, increasing weakness, and feeling chills intermittently.

## 2015-06-11 LAB — URINE CULTURE

## 2015-06-12 ENCOUNTER — Telehealth: Payer: Self-pay | Admitting: *Deleted

## 2015-06-12 NOTE — ED Notes (Signed)
Post ED Visit - Positive Culture Follow-up  Culture report reviewed by antimicrobial stewardship pharmacist:  []  Elenor Quinones, Pharm.D. []  Heide Guile, Pharm.D., BCPS []  Parks Neptune, Pharm.D. []  Alycia Rossetti, Pharm.D., BCPS []  [place], [place].D., BCPS, AAHIVP []  Legrand Como, Pharm.D., BCPS, AAHIVP []  Milus Glazier, Pharm.D. []  Stephens November, Pharm.D.  Positive urine culture No further patient follow-up is required at this time per Shary Decamp, PA-C.  Harlon Flor [facility] 06/12/2015, 11:05 AM

## 2015-06-12 NOTE — Progress Notes (Signed)
ED Antimicrobial Stewardship Positive Culture Follow Up   Teresa Golden is an 70 y.o. female who presented to [facility] on 06/09/2015 with a chief complaint of  Chief Complaint  Patient presents with  . Nausea  . Fatigue    Recent Results (from the past 720 hour(s))  Urine culture     Status: Abnormal   Collection Time: 06/09/15  5:00 PM  Result Value Ref Range Status   Specimen Description URINE, CLEAN CATCH  Final   Special Requests NONE  Final   Culture >=100,000 COLONIES/mL ESCHERICHIA COLI (A)  Final   Report Status 06/11/2015 FINAL  Final   Organism ID, Bacteria ESCHERICHIA COLI (A)  Final      Susceptibility   Escherichia coli - MIC*    AMPICILLIN <=2 SENSITIVE Sensitive     CEFAZOLIN <=4 SENSITIVE Sensitive     CEFTRIAXONE <=1 SENSITIVE Sensitive     CIPROFLOXACIN <=0.25 SENSITIVE Sensitive     GENTAMICIN <=1 SENSITIVE Sensitive     IMIPENEM <=0.25 SENSITIVE Sensitive     NITROFURANTOIN <=16 SENSITIVE Sensitive     TRIMETH/SULFA <=20 SENSITIVE Sensitive     AMPICILLIN/SULBACTAM <=2 SENSITIVE Sensitive     PIP/TAZO <=4 SENSITIVE Sensitive     * >=100,000 COLONIES/mL ESCHERICHIA COLI    Complaints were found to be due to a medication side effect. No signs or symptoms of UTI. UA negative. No treatment indicated.  ED Provider: Shary Decamp, PA-C   Liliane Shi 06/12/2015, 8:38 AM PharmD Candidate

## 2015-06-22 DIAGNOSIS — M25569 Pain in unspecified knee: Secondary | ICD-10-CM | POA: Diagnosis not present

## 2015-06-22 DIAGNOSIS — E119 Type 2 diabetes mellitus without complications: Secondary | ICD-10-CM | POA: Diagnosis not present

## 2015-06-26 DIAGNOSIS — M25569 Pain in unspecified knee: Secondary | ICD-10-CM | POA: Diagnosis not present

## 2015-06-28 DIAGNOSIS — M25569 Pain in unspecified knee: Secondary | ICD-10-CM | POA: Diagnosis not present

## 2015-07-04 DIAGNOSIS — Z23 Encounter for immunization: Secondary | ICD-10-CM | POA: Diagnosis not present

## 2015-07-04 DIAGNOSIS — I129 Hypertensive chronic kidney disease with stage 1 through stage 4 chronic kidney disease, or unspecified chronic kidney disease: Secondary | ICD-10-CM | POA: Diagnosis not present

## 2015-07-04 DIAGNOSIS — M25569 Pain in unspecified knee: Secondary | ICD-10-CM | POA: Diagnosis not present

## 2015-07-04 DIAGNOSIS — N08 Glomerular disorders in diseases classified elsewhere: Secondary | ICD-10-CM | POA: Diagnosis not present

## 2015-07-04 DIAGNOSIS — E1122 Type 2 diabetes mellitus with diabetic chronic kidney disease: Secondary | ICD-10-CM | POA: Diagnosis not present

## 2015-07-04 DIAGNOSIS — N182 Chronic kidney disease, stage 2 (mild): Secondary | ICD-10-CM | POA: Diagnosis not present

## 2015-07-18 DIAGNOSIS — E119 Type 2 diabetes mellitus without complications: Secondary | ICD-10-CM | POA: Diagnosis not present

## 2015-07-18 DIAGNOSIS — H43811 Vitreous degeneration, right eye: Secondary | ICD-10-CM | POA: Diagnosis not present

## 2015-09-12 DIAGNOSIS — D1803 Hemangioma of intra-abdominal structures: Secondary | ICD-10-CM | POA: Diagnosis not present

## 2015-09-12 DIAGNOSIS — K740 Hepatic fibrosis: Secondary | ICD-10-CM | POA: Diagnosis not present

## 2015-09-12 DIAGNOSIS — B182 Chronic viral hepatitis C: Secondary | ICD-10-CM | POA: Diagnosis not present

## 2015-09-21 ENCOUNTER — Ambulatory Visit
Admission: RE | Admit: 2015-09-21 | Discharge: 2015-09-21 | Disposition: A | Discharge status: Home / Self Care | Payer: Medicare HMO | Source: Ambulatory Visit | Attending: Nurse Practitioner | Admitting: Nurse Practitioner

## 2015-09-21 DIAGNOSIS — E119 Type 2 diabetes mellitus without complications: Secondary | ICD-10-CM | POA: Diagnosis not present

## 2015-09-21 DIAGNOSIS — K7689 Other specified diseases of liver: Secondary | ICD-10-CM | POA: Diagnosis not present

## 2015-09-21 DIAGNOSIS — D1803 Hemangioma of intra-abdominal structures: Secondary | ICD-10-CM

## 2015-09-21 MED ORDER — IOPAMIDOL (ISOVUE-300) INJECTION 61%
100.0000 mL | Freq: Once | INTRAVENOUS | Status: AC | PRN
  Administered 2015-09-21: 100 mL via INTRAVENOUS

## 2015-09-26 ENCOUNTER — Other Ambulatory Visit (HOSPITAL_COMMUNITY): Payer: Self-pay | Admitting: Surgery

## 2015-10-04 DIAGNOSIS — N182 Chronic kidney disease, stage 2 (mild): Secondary | ICD-10-CM | POA: Diagnosis not present

## 2015-10-04 DIAGNOSIS — N08 Glomerular disorders in diseases classified elsewhere: Secondary | ICD-10-CM | POA: Diagnosis not present

## 2015-10-04 DIAGNOSIS — E1122 Type 2 diabetes mellitus with diabetic chronic kidney disease: Secondary | ICD-10-CM | POA: Diagnosis not present

## 2015-10-04 DIAGNOSIS — I129 Hypertensive chronic kidney disease with stage 1 through stage 4 chronic kidney disease, or unspecified chronic kidney disease: Secondary | ICD-10-CM | POA: Diagnosis not present

## 2015-10-08 ENCOUNTER — Ambulatory Visit (HOSPITAL_COMMUNITY)
Admission: RE | Admit: 2015-10-08 | Discharge: 2015-10-08 | Disposition: A | Discharge status: Home / Self Care | Payer: Medicare HMO | Source: Ambulatory Visit | Attending: Surgery | Admitting: Surgery

## 2015-10-08 DIAGNOSIS — Z01818 Encounter for other preprocedural examination: Secondary | ICD-10-CM | POA: Diagnosis not present

## 2015-10-10 DIAGNOSIS — M1711 Unilateral primary osteoarthritis, right knee: Secondary | ICD-10-CM | POA: Diagnosis not present

## 2015-10-10 DIAGNOSIS — M25561 Pain in right knee: Secondary | ICD-10-CM | POA: Diagnosis not present

## 2015-10-18 DIAGNOSIS — M25561 Pain in right knee: Secondary | ICD-10-CM | POA: Diagnosis not present

## 2015-10-18 DIAGNOSIS — M1711 Unilateral primary osteoarthritis, right knee: Secondary | ICD-10-CM | POA: Diagnosis not present

## 2015-10-22 ENCOUNTER — Other Ambulatory Visit: Payer: Self-pay | Admitting: Internal Medicine

## 2015-10-22 DIAGNOSIS — R32 Unspecified urinary incontinence: Secondary | ICD-10-CM | POA: Diagnosis not present

## 2015-10-22 DIAGNOSIS — Z1231 Encounter for screening mammogram for malignant neoplasm of breast: Secondary | ICD-10-CM

## 2015-10-25 DIAGNOSIS — M1711 Unilateral primary osteoarthritis, right knee: Secondary | ICD-10-CM | POA: Diagnosis not present

## 2015-10-25 DIAGNOSIS — M25561 Pain in right knee: Secondary | ICD-10-CM | POA: Diagnosis not present

## 2015-10-31 ENCOUNTER — Other Ambulatory Visit (HOSPITAL_COMMUNITY): Payer: Self-pay | Admitting: Surgery

## 2015-11-01 DIAGNOSIS — K219 Gastro-esophageal reflux disease without esophagitis: Secondary | ICD-10-CM | POA: Diagnosis not present

## 2015-11-01 DIAGNOSIS — I10 Essential (primary) hypertension: Secondary | ICD-10-CM | POA: Diagnosis not present

## 2015-11-01 DIAGNOSIS — E119 Type 2 diabetes mellitus without complications: Secondary | ICD-10-CM | POA: Diagnosis not present

## 2015-11-01 DIAGNOSIS — M1711 Unilateral primary osteoarthritis, right knee: Secondary | ICD-10-CM | POA: Diagnosis not present

## 2015-11-01 DIAGNOSIS — R32 Unspecified urinary incontinence: Secondary | ICD-10-CM | POA: Diagnosis not present

## 2015-11-01 DIAGNOSIS — M25561 Pain in right knee: Secondary | ICD-10-CM | POA: Diagnosis not present

## 2015-11-07 DIAGNOSIS — M1711 Unilateral primary osteoarthritis, right knee: Secondary | ICD-10-CM | POA: Diagnosis not present

## 2015-11-07 DIAGNOSIS — M25561 Pain in right knee: Secondary | ICD-10-CM | POA: Diagnosis not present

## 2015-11-08 ENCOUNTER — Other Ambulatory Visit (HOSPITAL_COMMUNITY): Payer: Medicare HMO

## 2015-11-08 ENCOUNTER — Ambulatory Visit (HOSPITAL_COMMUNITY): Payer: Medicare HMO

## 2015-11-13 DIAGNOSIS — E669 Obesity, unspecified: Secondary | ICD-10-CM | POA: Diagnosis not present

## 2015-11-13 DIAGNOSIS — Z8619 Personal history of other infectious and parasitic diseases: Secondary | ICD-10-CM | POA: Diagnosis not present

## 2015-11-13 DIAGNOSIS — K7460 Unspecified cirrhosis of liver: Secondary | ICD-10-CM | POA: Diagnosis not present

## 2015-11-14 DIAGNOSIS — E559 Vitamin D deficiency, unspecified: Secondary | ICD-10-CM | POA: Diagnosis not present

## 2015-11-14 DIAGNOSIS — I129 Hypertensive chronic kidney disease with stage 1 through stage 4 chronic kidney disease, or unspecified chronic kidney disease: Secondary | ICD-10-CM | POA: Diagnosis not present

## 2015-11-14 DIAGNOSIS — N183 Chronic kidney disease, stage 3 (moderate): Secondary | ICD-10-CM | POA: Diagnosis not present

## 2015-11-14 DIAGNOSIS — M109 Gout, unspecified: Secondary | ICD-10-CM | POA: Diagnosis not present

## 2015-11-14 DIAGNOSIS — M25561 Pain in right knee: Secondary | ICD-10-CM | POA: Diagnosis not present

## 2015-11-14 DIAGNOSIS — M1711 Unilateral primary osteoarthritis, right knee: Secondary | ICD-10-CM | POA: Diagnosis not present

## 2015-11-15 ENCOUNTER — Ambulatory Visit (HOSPITAL_COMMUNITY)
Admission: RE | Admit: 2015-11-15 | Discharge: 2015-11-15 | Disposition: A | Discharge status: Home / Self Care | Payer: Medicare HMO | Source: Ambulatory Visit | Attending: Surgery | Admitting: Surgery

## 2015-11-15 ENCOUNTER — Other Ambulatory Visit: Payer: Self-pay

## 2015-11-15 DIAGNOSIS — Z01810 Encounter for preprocedural cardiovascular examination: Secondary | ICD-10-CM | POA: Insufficient documentation

## 2015-11-15 DIAGNOSIS — R0602 Shortness of breath: Secondary | ICD-10-CM | POA: Diagnosis not present

## 2015-11-15 DIAGNOSIS — Z01818 Encounter for other preprocedural examination: Secondary | ICD-10-CM | POA: Insufficient documentation

## 2015-11-15 DIAGNOSIS — K7689 Other specified diseases of liver: Secondary | ICD-10-CM | POA: Diagnosis not present

## 2015-11-21 ENCOUNTER — Ambulatory Visit: Payer: Medicare HMO | Admitting: Dietician

## 2015-11-29 ENCOUNTER — Ambulatory Visit: Payer: Medicare HMO

## 2015-12-04 ENCOUNTER — Ambulatory Visit: Payer: Medicare HMO | Admitting: Psychiatry

## 2015-12-05 ENCOUNTER — Ambulatory Visit
Admission: RE | Admit: 2015-12-05 | Discharge: 2015-12-05 | Disposition: A | Discharge status: Home / Self Care | Payer: Medicare HMO | Source: Ambulatory Visit | Attending: Internal Medicine | Admitting: Internal Medicine

## 2015-12-05 DIAGNOSIS — Z1231 Encounter for screening mammogram for malignant neoplasm of breast: Secondary | ICD-10-CM

## 2015-12-06 DIAGNOSIS — K5900 Constipation, unspecified: Secondary | ICD-10-CM | POA: Diagnosis not present

## 2015-12-06 DIAGNOSIS — K219 Gastro-esophageal reflux disease without esophagitis: Secondary | ICD-10-CM | POA: Diagnosis not present

## 2015-12-10 ENCOUNTER — Encounter: Payer: Medicare HMO | Attending: Surgery | Admitting: Skilled Nursing Facility1

## 2015-12-10 DIAGNOSIS — Z713 Dietary counseling and surveillance: Secondary | ICD-10-CM | POA: Diagnosis not present

## 2015-12-10 DIAGNOSIS — E6609 Other obesity due to excess calories: Secondary | ICD-10-CM

## 2015-12-10 NOTE — Progress Notes (Addendum)
  Pre-Op Assessment Visit:  Pre-Operative Sleeve Gastrectomy Surgery  Medical Nutrition Therapy:  Appt start time: 10:30   End time:  11:30  Patient was seen on 12/10/2015 for Pre-Operative Nutrition Assessment. Assessment and letter of approval faxed to [facility] Bariatric Surgery Program coordinator on 12/10/2015.  Pt has had the lap band and states she knows why that failed for her and she understands what she needs to do to be successful with the sleeve surgery. Pt states her A1C has decreased and her numbers are about 105 or 135 before breakfast.   Surgery type: Sleeve Gastrectomy  Start weight at [facility]: 291.14 Weight today: 291.14  24 hr Dietary Recall: First Meal: cereal Snack: chips Second Meal: sandwich Snack: nothing Third Meal: meat and salad Snack: dessert  Beverages: water, juice, tea  -Follow diet recommendations listed below   Energy and Macronutrient Recomendations: Calories: 1500 Carbohydrate: 170 Protein: 112 Fat: 42  Encouraged to engage in 50 minutes of moderate physical activity including cardiovascular and weight baring weekly Preferred Learning Style:   No preference indicated   Learning Readiness:   Change in progress  Handouts given during visit include:  Pre-Op Goals Bariatric Surgery Protein Shakes  During the appointment today the following Pre-Op Goals were reviewed with the patient: Maintain or lose weight as instructed by your surgeon Make healthy food choices Begin to limit portion sizes Limited concentrated sugars and fried foods Keep fat/sugar in the single digits per serving on  food labels Practice CHEWING your food  (aim for 30 chews per bite or until applesauce consistency) Practice not drinking 15 minutes before, during, and 30 minutes after each meal/snack Avoid all carbonated beverages  Avoid/limit caffeinated beverages  Avoid all sugar-sweetened beverages Consume 3 meals per day; eat every 3-5 hours Make a  list of non-food related activities Aim for 64-100 ounces of FLUID daily  Aim for at least 60-80 grams of PROTEIN daily Look for a liquid protein source that contain ?15 g protein and ?5 g carbohydrate  (ex: shakes, drinks, shots)  Patient-Centered Goals: 10/10 specific/non-scale and confidence/importance scale 1-10  Demonstrated degree of understanding via:  Teach Back  Teaching Method Utilized: Visual Auditory Hands on  Barriers to learning/adherence to lifestyle change: mobility  [facility] to [facility] to enroll in Pre-Op and Post-Op Nutrition Education when surgery date is scheduled.

## 2015-12-10 NOTE — Patient Instructions (Addendum)
-  Look into getting an endocrinologist referrel from your primary doctor  -Test your blood sugar before you eat breakfast and 2 hours after a meal -Drink half the amount of coffee in one sitting you usually do -Follow Pre-Op Goals Try Protein Shakes Call [facility] at [phone] when surgery is scheduled to enroll in Pre-Op Class  Things to remember:  Please always be honest with [place]. We want to support you!  If you have any questions or concerns in between appointments, please call or email Ferol Luz, or Margarita Grizzle.  The diet after surgery will be high protein and low in carbohydrate.  Vitamins and calcium need to be taken for the rest of your life.  Feel free to include support people in any classes or appointments.   Supplement recommendations:  Before Surgery   1 Complete Multivitamin with Iron  3000 IU Vitamin D3  After Surgery   2 Chewable Multivitamins  **Best Choice - Bariatric Advantage Advanced Multi EA      3 Chewable Calcium (500 mg each, total 1200-1500 mg per day)  **Best Choice - Celebrate, Bariatric Advantage, or Wellesse  Other Options:    2 Flinstones Complete + up to 100 mg Thiamin + 2000-3000 IU Vitamin D3 + 350-500 mcg Vitamin B12 + 30-45 mg Iron (with history of deficiency)  2 Celebrate MultiComplete with 18 mg Iron (this provides 6000 IU of  Vitamin D3)  4 Celebrate Essential Multi 2 in 1 (has calcium) + 18-60 mg separate  iron  Vitamins and Calcium are available at:   [facility]   [place], [place], [place] [address]   [email or web address]  [email or web address]  www.[email or web address]

## 2015-12-20 DIAGNOSIS — E119 Type 2 diabetes mellitus without complications: Secondary | ICD-10-CM | POA: Diagnosis not present

## 2015-12-26 DIAGNOSIS — N183 Chronic kidney disease, stage 3 (moderate): Secondary | ICD-10-CM | POA: Diagnosis not present

## 2015-12-26 DIAGNOSIS — Z0000 Encounter for general adult medical examination without abnormal findings: Secondary | ICD-10-CM | POA: Diagnosis not present

## 2015-12-26 DIAGNOSIS — N08 Glomerular disorders in diseases classified elsewhere: Secondary | ICD-10-CM | POA: Diagnosis not present

## 2015-12-26 DIAGNOSIS — Z23 Encounter for immunization: Secondary | ICD-10-CM | POA: Diagnosis not present

## 2015-12-26 DIAGNOSIS — E1122 Type 2 diabetes mellitus with diabetic chronic kidney disease: Secondary | ICD-10-CM | POA: Diagnosis not present

## 2015-12-26 DIAGNOSIS — I129 Hypertensive chronic kidney disease with stage 1 through stage 4 chronic kidney disease, or unspecified chronic kidney disease: Secondary | ICD-10-CM | POA: Diagnosis not present

## 2016-01-01 ENCOUNTER — Ambulatory Visit: Payer: Medicare HMO | Admitting: Psychiatry

## 2016-01-03 DIAGNOSIS — E1151 Type 2 diabetes mellitus with diabetic peripheral angiopathy without gangrene: Secondary | ICD-10-CM | POA: Diagnosis not present

## 2016-01-03 DIAGNOSIS — L602 Onychogryphosis: Secondary | ICD-10-CM | POA: Diagnosis not present

## 2016-01-03 DIAGNOSIS — L84 Corns and callosities: Secondary | ICD-10-CM | POA: Diagnosis not present

## 2016-01-08 ENCOUNTER — Encounter: Payer: Medicare HMO | Attending: Surgery | Admitting: Dietician

## 2016-01-08 ENCOUNTER — Encounter: Payer: Self-pay | Admitting: Dietician

## 2016-01-08 DIAGNOSIS — Z713 Dietary counseling and surveillance: Secondary | ICD-10-CM | POA: Diagnosis not present

## 2016-01-08 DIAGNOSIS — E118 Type 2 diabetes mellitus with unspecified complications: Secondary | ICD-10-CM

## 2016-01-08 DIAGNOSIS — Z794 Long term (current) use of insulin: Secondary | ICD-10-CM

## 2016-01-08 NOTE — Progress Notes (Addendum)
  Supervised Weight Loss:  Appt start time: 1115 end time:  1130.  SWL visit 1:  Primary concerns today: [name] returns for her 1st SWL visit having lost 3 pounds in the last month. Recently reduced insulin by 15 units. Testing blood sugars in the morning and before evening meal. Most recent HgbA1c 6.5%. Not having any low blood sugars. Struggling to reduce caffeine. Being mindful of eating regular, high protein meals and snacks. Also being mindful of portion sizes. Has cut out beef and trying to eat more chicken and vegetables. Bought smaller paper plates.   Weight: 289 lbs BMI: 46.65  MEDICATIONS: see list  DIETARY INTAKE:  24-hr recall: Thyroid pill at 5-6 am B (6 AM): coffee and light and fit yogurt  Snk ( AM): sausage and toast   L ( PM): 1/2 fried chicken sandwich  Snk ( PM):  D ( PM): chicken and spring mix salad  Snk ( PM):   Beverages: diet Ocean Spray cranberry, rarely soda, trying to drink more water  Recent physical activity: limited due to neuropathy (trying to move more overall)  Encouraged to engage in 90 minutes of moderate physical activity including cardiovascular and weight baring weekly  -Follow diet recommendations listed below   Energy and Macronutrient Recomendations:  1500-1600 calories 170-180 g CHO 112-120 g PRO 42-44 g fat  Progress Towards Goal(s):  In progress.   Nutritional Diagnosis:  -3.3 Overweight/obesity related to past poor dietary habits and physical inactivity as evidenced by patient in SWL for pending bariatric surgery following dietary guidelines for continued weight loss. Goals: -Start thinking about weaning off of caffeine (start by replacing a scoop of regular coffee with decaf)  -Keep trying to move more overall -Keep eating small, frequent, high protein meals and snacks    Intervention:  Nutrition counseling provided.  Handouts given during visit include:  none  Monitoring/Evaluation:  Dietary intake, exercise, and body  weight in 4 week(s).

## 2016-01-08 NOTE — Patient Instructions (Addendum)
-  Start thinking about weaning off of caffeine (start by replacing a scoop of regular coffee with decaf)  -Keep trying to move more overall -Keep eating small, frequent, high protein meals and snacks

## 2016-01-16 DIAGNOSIS — N951 Menopausal and female climacteric states: Secondary | ICD-10-CM | POA: Diagnosis not present

## 2016-01-23 DIAGNOSIS — K219 Gastro-esophageal reflux disease without esophagitis: Secondary | ICD-10-CM | POA: Diagnosis not present

## 2016-01-23 DIAGNOSIS — Z01818 Encounter for other preprocedural examination: Secondary | ICD-10-CM | POA: Diagnosis not present

## 2016-01-23 DIAGNOSIS — K2970 Gastritis, unspecified, without bleeding: Secondary | ICD-10-CM | POA: Diagnosis not present

## 2016-01-24 ENCOUNTER — Ambulatory Visit: Payer: Medicare HMO | Admitting: Psychiatry

## 2016-01-31 ENCOUNTER — Ambulatory Visit (INDEPENDENT_AMBULATORY_CARE_PROVIDER_SITE_OTHER): Payer: Medicare HMO | Admitting: Psychiatry

## 2016-01-31 DIAGNOSIS — F509 Eating disorder, unspecified: Secondary | ICD-10-CM

## 2016-01-31 DIAGNOSIS — R69 Illness, unspecified: Secondary | ICD-10-CM | POA: Diagnosis not present

## 2016-02-05 ENCOUNTER — Encounter: Payer: Medicare HMO | Attending: Surgery | Admitting: Dietician

## 2016-02-05 DIAGNOSIS — Z713 Dietary counseling and surveillance: Secondary | ICD-10-CM | POA: Diagnosis present

## 2016-02-05 NOTE — Progress Notes (Addendum)
  Supervised Weight Loss:  Appt start time: 1115 end time:  1130  SWL visit 2:  Primary concerns today: Teresa Golden returns for her 2nd SWL visit excited to have lost 5 pounds despite Thanksgiving. Has had some financial stress. Still struggling to reduce caffeine; has been having headaches. Has been drinking more decaf hot tea. Has completely eliminated alcohol.    Weight: 284.5 lbs BMI: 45.92  MEDICATIONS: see list  DIETARY INTAKE:  24-hr recall: Thyroid pill at 5-6 am B (6 AM): coffee and light and fit yogurt  Snk ( AM): sausage and toast   L ( PM): 1/2 fried chicken sandwich  Snk ( PM):  D ( PM): chicken and spring mix salad  Snk ( PM):   Beverages: diet Ocean Spray cranberry, rarely soda, trying to drink more water  Recent physical activity: limited due to neuropathy (trying to move more overall)  Encouraged to engage in 90 minutes of moderate physical activity including cardiovascular and weight baring weekly  -Follow diet recommendations listed below   Energy and Macronutrient Recomendations:  1500-1600 calories 170-180 g CHO 112-120 g PRO 42-44 g fat  Progress Towards Goal(s):  In progress.   Nutritional Diagnosis:  [place]-3.3 Overweight/obesity related to past poor dietary habits and physical inactivity as evidenced by patient in SWL for pending bariatric surgery following dietary guidelines for continued weight loss.    Intervention:  Nutrition counseling provided.  Handouts given during visit include:  none  Monitoring/Evaluation:  Dietary intake, exercise, and body weight in 4 week(s).

## 2016-02-05 NOTE — Patient Instructions (Addendum)
-  Plan to start weaning off of caffeine in the new year -Keep trying to move more overall -Keep eating small, frequent, high protein meals and snacks and limit sweets

## 2016-02-06 ENCOUNTER — Encounter: Payer: Self-pay | Admitting: Dietician

## 2016-02-07 DIAGNOSIS — E119 Type 2 diabetes mellitus without complications: Secondary | ICD-10-CM | POA: Diagnosis not present

## 2016-02-12 DIAGNOSIS — R69 Illness, unspecified: Secondary | ICD-10-CM | POA: Diagnosis not present

## 2016-02-19 DIAGNOSIS — R69 Illness, unspecified: Secondary | ICD-10-CM | POA: Diagnosis not present

## 2016-02-27 DIAGNOSIS — M7061 Trochanteric bursitis, right hip: Secondary | ICD-10-CM | POA: Diagnosis not present

## 2016-02-27 DIAGNOSIS — M791 Myalgia: Secondary | ICD-10-CM | POA: Diagnosis not present

## 2016-02-28 ENCOUNTER — Other Ambulatory Visit: Payer: Self-pay | Admitting: Nurse Practitioner

## 2016-02-28 DIAGNOSIS — D1800 Hemangioma unspecified site: Secondary | ICD-10-CM

## 2016-02-28 DIAGNOSIS — K7469 Other cirrhosis of liver: Secondary | ICD-10-CM

## 2016-03-04 ENCOUNTER — Ambulatory Visit: Payer: Self-pay | Admitting: Dietician

## 2016-03-05 DIAGNOSIS — R69 Illness, unspecified: Secondary | ICD-10-CM | POA: Diagnosis not present

## 2016-03-07 ENCOUNTER — Encounter: Payer: Self-pay | Admitting: Dietician

## 2016-03-07 ENCOUNTER — Encounter: Payer: Medicare HMO | Attending: Surgery | Admitting: Dietician

## 2016-03-07 DIAGNOSIS — Z6841 Body Mass Index (BMI) 40.0 and over, adult: Secondary | ICD-10-CM | POA: Diagnosis not present

## 2016-03-07 DIAGNOSIS — Z713 Dietary counseling and surveillance: Secondary | ICD-10-CM | POA: Insufficient documentation

## 2016-03-07 NOTE — Progress Notes (Addendum)
  Supervised Weight Loss:  Appt start time: 1040 end time:  1055  SWL visit 3:  Primary concerns today: Teresa Golden returns for her 3rd SWL visit excited to have lost a pound. Feels like she should have lost more but excited to have lost almost 10 pounds since October. Has been trying to focus on making good choices; lean meats and non starchy vegetables. Plans to attend bariatric support group this afternoon. Comes with several questions regarding the pre op and post op diets.   Weight: 283.3 lbs BMI: 45.73  MEDICATIONS: see list  DIETARY INTAKE:  24-hr recall: Thyroid pill at 5-6 am B (6 AM): coffee and light and fit yogurt  Snk ( AM): sausage and toast   L ( PM): 1/2 fried chicken sandwich  Snk ( PM):  D ( PM): chicken and spring mix salad  Snk ( PM):   Beverages: diet Ocean Spray cranberry, rarely soda, trying to drink more water  Recent physical activity: limited due to neuropathy (trying to move more overall)  Encouraged to engage in 90 minutes of moderate physical activity including cardiovascular and weight baring weekly  -Follow diet recommendations listed below   Energy and Macronutrient Recomendations:  1500-1600 calories 170-180 g CHO 112-120 g PRO 42-44 g fat  Progress Towards Goal(s):  In progress.   Nutritional Diagnosis:  [place]-3.3 Overweight/obesity related to past poor dietary habits and physical inactivity as evidenced by patient in SWL for pending bariatric surgery following dietary guidelines for continued weight loss.    Intervention:  Nutrition counseling provided.  Handouts given during visit include:  Pre op diet  Monitoring/Evaluation:  Dietary intake, exercise, and body weight in 4 week(s).

## 2016-03-07 NOTE — Patient Instructions (Signed)
-  Plan to start weaning off of caffeine in the new year -Keep trying to move more overall -Keep eating small, frequent, high protein meals and snacks and limit sweets

## 2016-03-14 ENCOUNTER — Other Ambulatory Visit: Payer: Self-pay

## 2016-03-17 DIAGNOSIS — E119 Type 2 diabetes mellitus without complications: Secondary | ICD-10-CM | POA: Diagnosis not present

## 2016-03-19 ENCOUNTER — Ambulatory Visit
Admission: RE | Admit: 2016-03-19 | Discharge: 2016-03-19 | Disposition: A | Discharge status: Home / Self Care | Payer: Medicare HMO | Source: Ambulatory Visit | Attending: Nurse Practitioner | Admitting: Nurse Practitioner

## 2016-03-19 DIAGNOSIS — K7469 Other cirrhosis of liver: Secondary | ICD-10-CM

## 2016-03-19 DIAGNOSIS — K7460 Unspecified cirrhosis of liver: Secondary | ICD-10-CM | POA: Diagnosis not present

## 2016-03-19 DIAGNOSIS — D1800 Hemangioma unspecified site: Secondary | ICD-10-CM

## 2016-03-20 DIAGNOSIS — R69 Illness, unspecified: Secondary | ICD-10-CM | POA: Diagnosis not present

## 2016-03-27 DIAGNOSIS — E039 Hypothyroidism, unspecified: Secondary | ICD-10-CM | POA: Diagnosis not present

## 2016-03-27 DIAGNOSIS — I129 Hypertensive chronic kidney disease with stage 1 through stage 4 chronic kidney disease, or unspecified chronic kidney disease: Secondary | ICD-10-CM | POA: Diagnosis not present

## 2016-03-27 DIAGNOSIS — N183 Chronic kidney disease, stage 3 (moderate): Secondary | ICD-10-CM | POA: Diagnosis not present

## 2016-03-27 DIAGNOSIS — E1122 Type 2 diabetes mellitus with diabetic chronic kidney disease: Secondary | ICD-10-CM | POA: Diagnosis not present

## 2016-03-27 DIAGNOSIS — N08 Glomerular disorders in diseases classified elsewhere: Secondary | ICD-10-CM | POA: Diagnosis not present

## 2016-04-01 ENCOUNTER — Encounter: Payer: Medicare HMO | Attending: Surgery | Admitting: Dietician

## 2016-04-01 ENCOUNTER — Encounter: Payer: Self-pay | Admitting: Dietician

## 2016-04-01 DIAGNOSIS — E118 Type 2 diabetes mellitus with unspecified complications: Secondary | ICD-10-CM

## 2016-04-01 DIAGNOSIS — Z713 Dietary counseling and surveillance: Secondary | ICD-10-CM | POA: Insufficient documentation

## 2016-04-01 NOTE — Progress Notes (Addendum)
  Supervised Weight Loss:  Appt start time: 1115 end time:  1130  SWL visit 4:  Primary concerns today: Teresa Golden returns for her 4th SWL visit having gained 6 pounds in the last month. Feeling depressed lately and stressed about the cost of her medications. She states that she tends to stress eat. She has been attending support group and surrounding herself with positive people. She has a therapist that she sees every 2 weeks. She states that she realizes that the sleeve is not a quick fix and understands that she has to put in work.  She recently joined the [facility] and plans to start going to water aerobics 1x a week and work up to 2x a week in March.   Weight: 289.5 lbs BMI: 46.73  MEDICATIONS: see list  DIETARY INTAKE:  24-hr recall: Thyroid pill at 5-6 am B (6 AM): coffee and light and fit yogurt  Snk ( AM): sausage and toast   L ( PM): 1/2 fried chicken sandwich  Snk ( PM):  D ( PM): chicken and spring mix salad  Snk ( PM):   Beverages: diet Ocean Spray cranberry, rarely soda, trying to drink more water  Recent physical activity: limited due to neuropathy (trying to move more overall)   Encouraged to engage in 90 minutes of moderate physical activity including cardiovascular and weight baring weekly  -Follow diet recommendations listed below   Energy and Macronutrient Recomendations:  1500-1600 calories 170-180 g CHO 112-120 g PRO 42-44 g fat  Progress Towards Goal(s):  In progress.   Nutritional Diagnosis:  -3.3 Overweight/obesity related to past poor dietary habits and physical inactivity as evidenced by patient in SWL for pending bariatric surgery following dietary guidelines for continued weight loss.    Intervention:  Nutrition counseling provided.  Handouts given during visit include:  Pre op diet  Monitoring/Evaluation:  Dietary intake, exercise, and body weight in 4 week(s).

## 2016-04-01 NOTE — Patient Instructions (Signed)
-  Plan to start weaning off of caffeine in the new year -Keep trying to move more overall -Keep eating small, frequent, high protein meals and snacks and limit sweets

## 2016-04-03 DIAGNOSIS — M79672 Pain in left foot: Secondary | ICD-10-CM | POA: Diagnosis not present

## 2016-04-03 DIAGNOSIS — M79671 Pain in right foot: Secondary | ICD-10-CM | POA: Diagnosis not present

## 2016-04-03 DIAGNOSIS — R69 Illness, unspecified: Secondary | ICD-10-CM | POA: Diagnosis not present

## 2016-04-03 DIAGNOSIS — G609 Hereditary and idiopathic neuropathy, unspecified: Secondary | ICD-10-CM | POA: Diagnosis not present

## 2016-04-15 IMAGING — CR DG CHEST 2V
2 series · 2 of 2 positions shown · non-contrast
Comparison: Chest radiograph 09/28/2014

CLINICAL DATA: Nausea and weakness.  Muscle aches.

EXAM:
CHEST  2 VIEW

[chest lat]
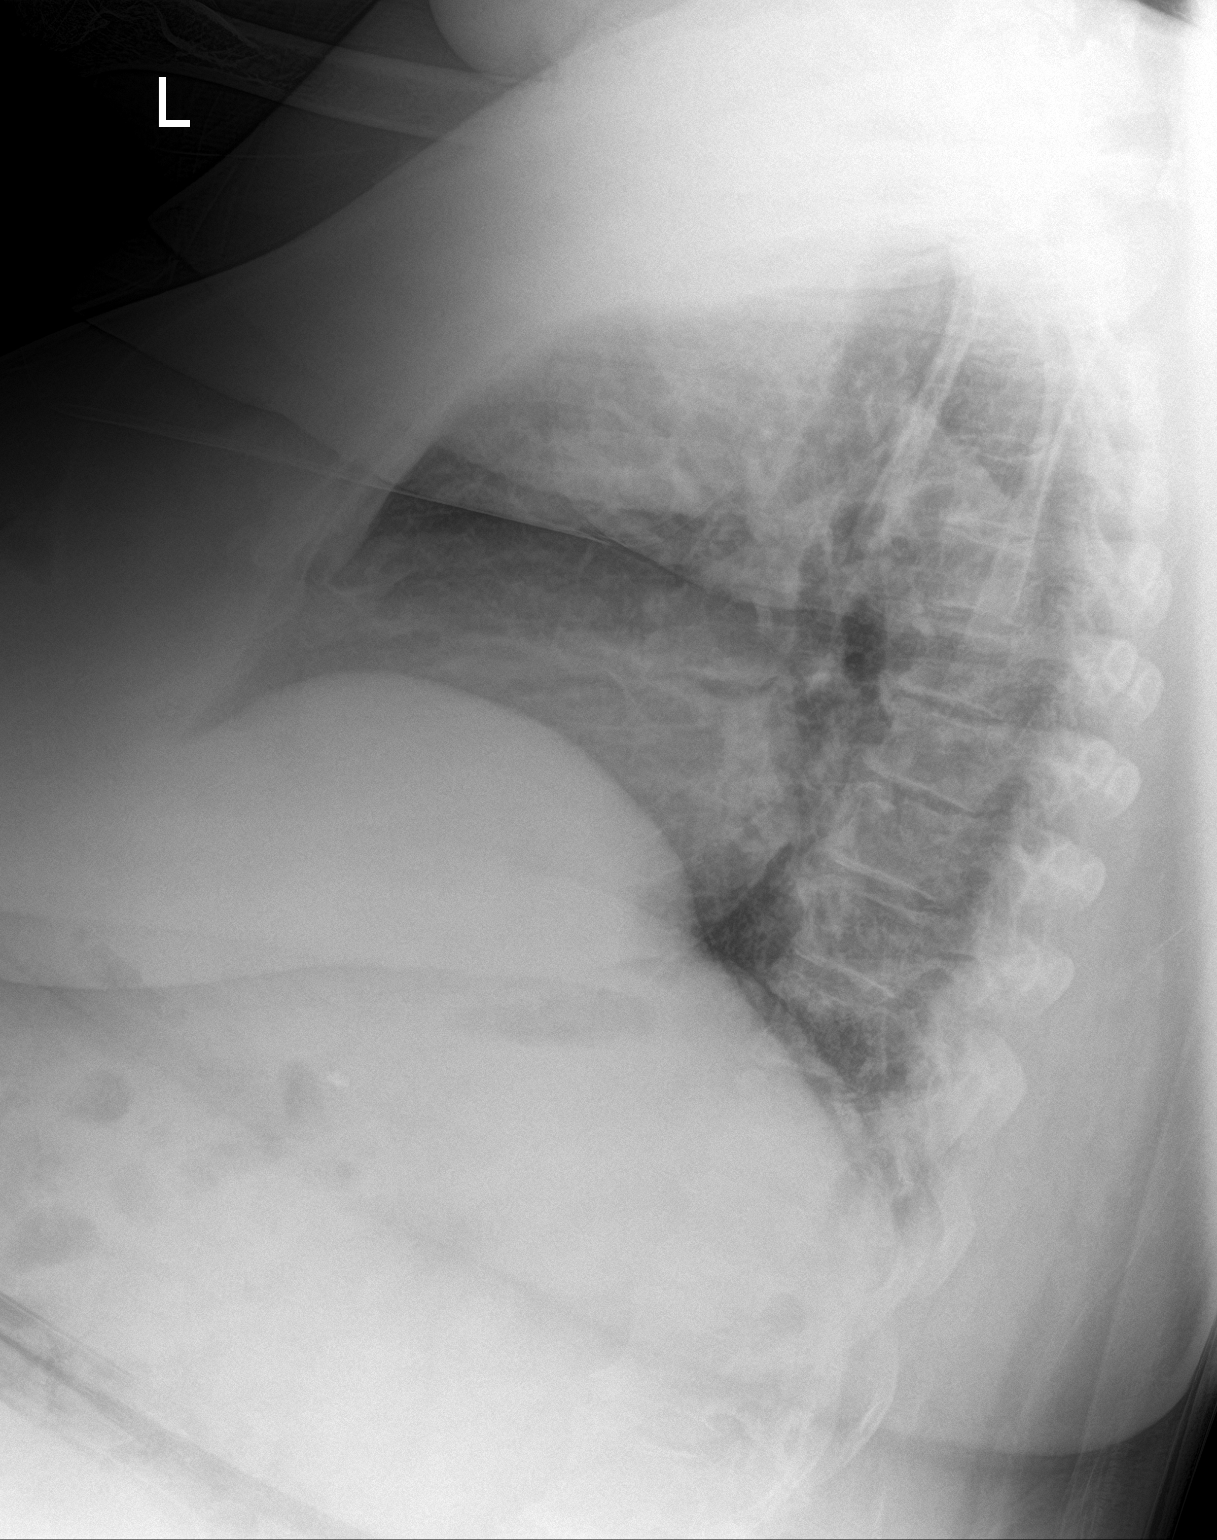

[chest ap]
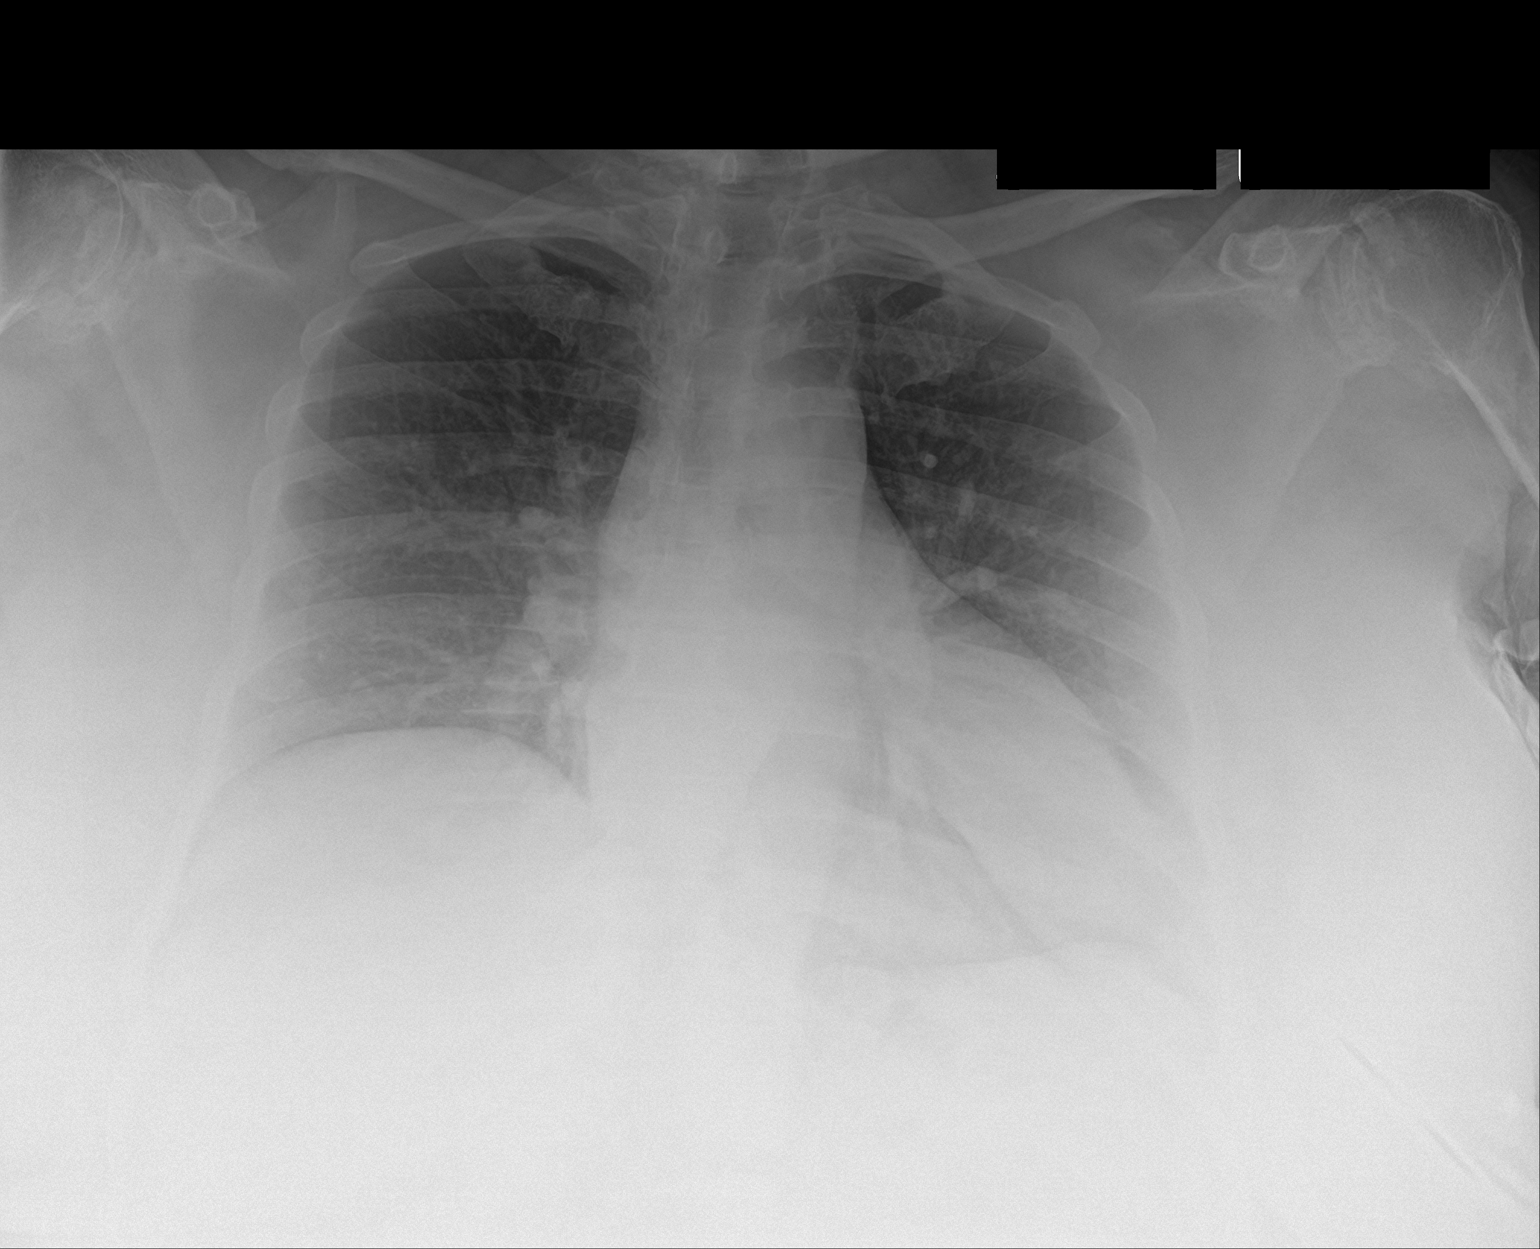

[2 of 2 positions shown; findings below may reference images not displayed]

FINDINGS: Stable enlarged cardiac and mediastinal contours. Elevation right
hemidiaphragm. No large area of pulmonary consolidation. No pleural
effusion or pneumothorax. Bilateral shoulder joint degenerative
changes. Thoracic spine degenerative changes.
IMPRESSION: No acute cardiopulmonary process.  Low lung volumes.

## 2016-04-22 DIAGNOSIS — R69 Illness, unspecified: Secondary | ICD-10-CM | POA: Diagnosis not present

## 2016-04-23 DIAGNOSIS — M25551 Pain in right hip: Secondary | ICD-10-CM | POA: Diagnosis not present

## 2016-04-23 DIAGNOSIS — M7061 Trochanteric bursitis, right hip: Secondary | ICD-10-CM | POA: Diagnosis not present

## 2016-04-28 ENCOUNTER — Ambulatory Visit: Payer: Self-pay | Admitting: Registered"

## 2016-04-29 ENCOUNTER — Ambulatory Visit: Payer: Self-pay | Admitting: Skilled Nursing Facility1

## 2016-05-01 ENCOUNTER — Encounter: Payer: Medicare HMO | Attending: Surgery | Admitting: Registered"

## 2016-05-01 ENCOUNTER — Ambulatory Visit (INDEPENDENT_AMBULATORY_CARE_PROVIDER_SITE_OTHER): Payer: Medicare HMO | Admitting: Psychiatry

## 2016-05-01 DIAGNOSIS — Z713 Dietary counseling and surveillance: Secondary | ICD-10-CM | POA: Insufficient documentation

## 2016-05-01 DIAGNOSIS — Z6841 Body Mass Index (BMI) 40.0 and over, adult: Secondary | ICD-10-CM | POA: Diagnosis not present

## 2016-05-01 DIAGNOSIS — E119 Type 2 diabetes mellitus without complications: Secondary | ICD-10-CM | POA: Insufficient documentation

## 2016-05-01 DIAGNOSIS — F509 Eating disorder, unspecified: Secondary | ICD-10-CM

## 2016-05-01 DIAGNOSIS — R69 Illness, unspecified: Secondary | ICD-10-CM | POA: Diagnosis not present

## 2016-05-01 NOTE — Progress Notes (Signed)
  Supervised Weight Loss:  Appt start time: 1004 end time:  1027  SWL visit 4:  Primary concerns today: Teresa Golden returns for her 5th SWL visit having lost 1.1 pounds in the last month. Feeling better about making healthier choices. She is still attending support group.  She reports that she is concerned about kidneys, doctor told her she needs to get more water to prevent having to go on dialysis. She reports that her doctors tells her to drink 80-100 oz.  Physical activity: Got a Cortizone shot for pain in R hip/leg 1 week ago, BG shot up to 400, but back down now. Pain has prevented her from getting exercise. Going to try to get to [facility] soon to use jacuzzi and exercise. Going to wait to have surgery until after a work related dinner at [employer].  Weight: 288.4 lbs BMI: 46.73  MEDICATIONS: see list  DIETARY INTAKE:  24-hr recall: Thyroid pill at 5-6 am B (6 AM): coffee and light and fit yogurt, slice of raisin bread toast  Snk ( AM): none - was busy   L ( PM): fried fish, fries  Snk ( PM): none D ( PM): chicken and spring mix salad  Snk ( PM): ice cream   Beverages: diet Ocean Spray cranberry, rarely soda, trying to drink more water  Recent physical activity:  Encouraged to engage in 90 minutes of moderate physical activity including cardiovascular and weight baring weekly  Encouraged to try Crystal light for another choice of liquid.  -Follow diet recommendations listed below   Energy and Macronutrient Recomendations:  1500-1600 calories 170-180 g CHO 112-120 g PRO 42-44 g fat  Progress Towards Goal(s):  In progress.   Nutritional Diagnosis:  [place]-3.3 Overweight/obesity related to past poor dietary habits and physical inactivity as evidenced by patient in SWL for pending bariatric surgery following dietary guidelines for continued weight loss.    Intervention:  Nutrition counseling provided.  Handouts given during visit include:  fluid  Monitoring/Evaluation:  Dietary  intake, exercise, and body weight in 4 week(s).

## 2016-05-13 DIAGNOSIS — B182 Chronic viral hepatitis C: Secondary | ICD-10-CM | POA: Diagnosis not present

## 2016-05-13 DIAGNOSIS — R933 Abnormal findings on diagnostic imaging of other parts of digestive tract: Secondary | ICD-10-CM | POA: Diagnosis not present

## 2016-05-14 DIAGNOSIS — R69 Illness, unspecified: Secondary | ICD-10-CM | POA: Diagnosis not present

## 2016-05-28 DIAGNOSIS — R69 Illness, unspecified: Secondary | ICD-10-CM | POA: Diagnosis not present

## 2016-06-05 ENCOUNTER — Encounter: Payer: Medicare HMO | Attending: Surgery | Admitting: Registered"

## 2016-06-05 DIAGNOSIS — Z713 Dietary counseling and surveillance: Secondary | ICD-10-CM | POA: Diagnosis not present

## 2016-06-05 DIAGNOSIS — Z6841 Body Mass Index (BMI) 40.0 and over, adult: Secondary | ICD-10-CM | POA: Insufficient documentation

## 2016-06-05 DIAGNOSIS — E119 Type 2 diabetes mellitus without complications: Secondary | ICD-10-CM | POA: Insufficient documentation

## 2016-06-05 NOTE — Progress Notes (Signed)
Supervised Weight Loss Appt start time: 1000    End time:  1224  SWL visit 6: Pt returns having lost 6.5 lbs since last month. Pt states she is having a problem with constipation, drinking about 60 oz fluid, but her MD recommends 80-100 oz. Pt has not increased fluid intake because she doesn't want urinate so often.   Pt became tearful because she is worried she won't be approved for surgery. Pt reports she wants to lose weight to feel better, breath easier, and improve mobility.   Patient states she has found protein shakes she likes: chocolate, strawberries and cream, caramel.  Surgery type: Sleeve Gastrectomy Start weight at [facility]: 291.14 lbs  Weight today: 281.9 lbs Weight change from start: 9.5 lb weight loss BMI: 45.50  MEDICATIONS: See List (Per pt Coreg extended release is helping her have good BP control ~122/80, insurance may not want to cover, but pt reports her MD sent a letter stating this medication is working to control BP and she needs it.)  DIETARY INTAKE:  24 hr Dietary Recall:  First Meal: sausage biscuit (at home), coffee, yogurt (lite & fit everyday)  Snack: yogurt Second Meal: (long horn) chicken, baked chips, soup, Snack: none OR protein shake  Third Meal: collard greens, smoked meat, cornbread (~15 g cho) Snack: oatmeal, raisin cookie OR sugar-free jello Beverages: coffee, water, diet cranberry juice  Physical Activity: doing a lot more walking (has more energy)  Behavior: taking 1000 units B complex, 5,000 vit D, multi vitamin senior   Nutritional Diagnosis:  [place]-3.3 Overweight/obesity related to past poor dietary habits and physical inactivity as evidenced by patient in SWL for pending bariatricsurgery and following dietary guidelines for continued weight loss.  Intervention:Nutrition counseling for upcoming Bariatric Surgery  Goals:  Work on not drinking 15 min before, during and 30 min after meals.  Aim to get out walking 2 times per week,  as tolerated  [place] job on having a plan for the [employer] and Macronutrient Recomendations: 1500-1600calories 170-180 g carbohydrates 112-120g protein 42-44g fat  Handouts given: Baritric vitamins  Teaching Method Utilized:  Visual Auditory  Barriers to learning/adherenceto lifestyle change:none stated  Demonstrated degree of understanding via: Teach Back  Monitoring/Evaluation: Dietary intake, exercise, and body weight prn. Return for 2 week pre-op class.

## 2016-06-05 NOTE — Patient Instructions (Addendum)
   Work on not drinking 15 min before, during and 30 min after meals.  Aim to get out walking 2 times per week, as tolerated  [place] job on having a plan for the [employer]

## 2016-06-11 DIAGNOSIS — R69 Illness, unspecified: Secondary | ICD-10-CM | POA: Diagnosis not present

## 2016-06-17 DIAGNOSIS — K219 Gastro-esophageal reflux disease without esophagitis: Secondary | ICD-10-CM | POA: Diagnosis not present

## 2016-06-17 DIAGNOSIS — K602 Anal fissure, unspecified: Secondary | ICD-10-CM | POA: Diagnosis not present

## 2016-06-17 DIAGNOSIS — K6289 Other specified diseases of anus and rectum: Secondary | ICD-10-CM | POA: Diagnosis not present

## 2016-06-24 DIAGNOSIS — E119 Type 2 diabetes mellitus without complications: Secondary | ICD-10-CM | POA: Diagnosis not present

## 2016-06-26 ENCOUNTER — Telehealth: Payer: Self-pay | Admitting: Skilled Nursing Facility1

## 2016-06-26 NOTE — Telephone Encounter (Signed)
Pt called asking if she could have decaf tea in the pre-operative diet  Dietitian advised the front office staff to tell her yes, should have decaf tea in the pre-op diet.

## 2016-07-01 DIAGNOSIS — N183 Chronic kidney disease, stage 3 (moderate): Secondary | ICD-10-CM | POA: Diagnosis not present

## 2016-07-01 DIAGNOSIS — E039 Hypothyroidism, unspecified: Secondary | ICD-10-CM | POA: Diagnosis not present

## 2016-07-01 DIAGNOSIS — I129 Hypertensive chronic kidney disease with stage 1 through stage 4 chronic kidney disease, or unspecified chronic kidney disease: Secondary | ICD-10-CM | POA: Diagnosis not present

## 2016-07-01 DIAGNOSIS — R0602 Shortness of breath: Secondary | ICD-10-CM | POA: Diagnosis not present

## 2016-07-01 DIAGNOSIS — Z01818 Encounter for other preprocedural examination: Secondary | ICD-10-CM | POA: Diagnosis not present

## 2016-07-01 DIAGNOSIS — E1122 Type 2 diabetes mellitus with diabetic chronic kidney disease: Secondary | ICD-10-CM | POA: Diagnosis not present

## 2016-07-01 DIAGNOSIS — N08 Glomerular disorders in diseases classified elsewhere: Secondary | ICD-10-CM | POA: Diagnosis not present

## 2016-07-03 DIAGNOSIS — R69 Illness, unspecified: Secondary | ICD-10-CM | POA: Diagnosis not present

## 2016-07-17 DIAGNOSIS — R69 Illness, unspecified: Secondary | ICD-10-CM | POA: Diagnosis not present

## 2016-07-24 ENCOUNTER — Ambulatory Visit: Payer: Medicare HMO | Admitting: Cardiovascular Disease

## 2016-07-29 DIAGNOSIS — R69 Illness, unspecified: Secondary | ICD-10-CM | POA: Diagnosis not present

## 2016-07-30 ENCOUNTER — Encounter: Payer: Self-pay | Admitting: Cardiology

## 2016-07-30 ENCOUNTER — Ambulatory Visit (INDEPENDENT_AMBULATORY_CARE_PROVIDER_SITE_OTHER): Payer: Medicare HMO | Admitting: Cardiology

## 2016-07-30 DIAGNOSIS — Z01810 Encounter for preprocedural cardiovascular examination: Secondary | ICD-10-CM | POA: Diagnosis not present

## 2016-07-30 DIAGNOSIS — R0600 Dyspnea, unspecified: Secondary | ICD-10-CM | POA: Insufficient documentation

## 2016-07-30 DIAGNOSIS — R600 Localized edema: Secondary | ICD-10-CM | POA: Insufficient documentation

## 2016-07-30 DIAGNOSIS — R0609 Other forms of dyspnea: Secondary | ICD-10-CM | POA: Diagnosis not present

## 2016-07-30 NOTE — Progress Notes (Signed)
PCP: Glendale Chard, MD  Surgeon: Dr. Hassell Done [facility]  Clinic Note: Chief Complaint  Patient presents with  . New Patient (Initial Visit)    needs surgical clearance for gastric surgery-sleeve(help with weight loss)  . Obesity    morbid obesity with exertional dyspnea    HPI: Teresa Golden is a 71 y.o. female who is being seen today for Preoperative Cardiovascular Evaluation with Exertional Dyspnea for Gastric Sleeve Sgx at the request of Glendale Chard, MD.  She is a morbidly obese woman with a relatively sedentary lifestyle. She was profoundly dyspneic simply walking into the clinic. She is walking with a walker. She has a BMI of 45, she has hypertension, insulin-dependent diabetes. Bilateral lower extremity neuropathy.  Teresa Golden was seen on May 9 by her PCP. Referred for cardiac clearance because of exertional dyspnea.  Recent Hospitalizations:  none  Studies Personally Reviewed - (if available, images/films reviewed: From Epic Chart or Care Everywhere)  Echo Oct 2015: EF 65-70%. Normal diastolic Fxn.  Normal Valves.  Interval History:  Is presents today really the only complaint they could be considered potentially cardiac in nature as exertional dyspnea. But this is probably more related to her obesity and profound deconditioning. She could barely walk from the car to the elevator.  Despite having a dyspnea, she denied any chest tightness or pressure associated with it. She says that she is partially limited because of neuropathic  Worse in the right than the left. It causes a lot of discomfort for her to walk. She has no PND or orthopnea but does hav ankle edema that is relatively stable. She says she sleeps with a wedge pillow  - more for  GERD than for orthopnea.   No palpitations, lightheadedness, dizziness, weakness or syncope/near syncope. No TIA/amaurosis fugax symptoms. No melena, hematochezia, hematuria, or epstaxis. No claudication.  ROS: A  comprehensive was performed. Review of Systems  Constitutional: Positive for malaise/fatigue. Negative for weight loss (She is trying to lose weight in order to qualify for her gastric surgery.).  HENT: Negative for congestion and nosebleeds.   Respiratory: Positive for shortness of breath. Negative for wheezing.   Gastrointestinal: Positive for heartburn. Negative for blood in stool and melena.  Genitourinary: Negative for hematuria.  Musculoskeletal: Positive for back pain, joint pain and myalgias.  Neurological:       Bilateral foot neuropathy  Endo/Heme/Allergies: Negative for environmental allergies.  Psychiatric/Behavioral: Positive for depression. Negative for memory loss. The patient does not have insomnia.   All other systems reviewed and are negative.   I have reviewed and (if needed) personally updated the patient's problem list, medications, allergies, past medical and surgical history, social and family history.   Past Medical History:  Diagnosis Date  . Arthritis   . Diabetes mellitus, type II, insulin dependent ([place])    With neurologic complications. Bilateral lower extremity peripheral neuropathy  . Essential hypertension   . Hepatitis C   . Morbid obesity with BMI of 50.0-59.9, adult [facility])     Past Surgical History:  Procedure Laterality Date  . ABDOMINAL HYSTERECTOMY    . CHOLECYSTECTOMY    . JOINT REPLACEMENT    . TONSILLECTOMY    . TRANSTHORACIC ECHOCARDIOGRAM  11/2013   EF 65-70%. Normal diastolic Fxn.  Normal Valves.    Current Meds  Medication Sig  . B Complex-C (B-COMPLEX WITH VITAMIN C) tablet Take 1 tablet by mouth daily.  . carvedilol (COREG) 3.125 MG tablet Take 2 tablets  by mouth every evening.   . Cholecalciferol 5000 units TABS Take 5,000 Units by mouth daily.  . colchicine 0.6 MG tablet Take 1 tablet (0.6 mg total) by mouth daily. (Patient taking differently: Take 0.6 mg by mouth daily as needed (FOR FLAREUPS). )  . docusate sodium (COLACE)  100 MG capsule Take 100 mg by mouth daily as needed for mild constipation.  . gabapentin (NEURONTIN) 600 MG tablet Take 600 mg by mouth at bedtime.   [name] HYDROcodone-acetaminophen (NORCO/VICODIN) 5-325 MG per tablet Take 1 tablet by mouth every 4 (four) hours as needed.  . insulin aspart (NOVOLOG) 100 UNIT/ML injection Inject 0-8 Units into the skin 3 (three) times daily before meals. SLIDING SCALE  . insulin aspart protamine-insulin aspart (NOVOLOG 70/30) (70-30) 100 UNIT/ML injection Inject into the skin. Depending on CGB--Takes 35 q AM and 40 Q pm  . levothyroxine (SYNTHROID, LEVOTHROID) 25 MCG tablet Take by mouth daily before breakfast. Take half tab Mon-Fri and whole tab Sat and Sun  . Multiple Vitamins-Minerals (MULTIVITAMIN WITH MINERALS) tablet Take 1 tablet by mouth daily.   . simvastatin (ZOCOR) 10 MG tablet Take 10 mg by mouth daily at 6 PM.   . valsartan (DIOVAN) 160 MG tablet Take 1 tablet by mouth at bedtime.     No Known Allergies  Social History   Social History  . Marital status: Unknown    Spouse name: N/A  . Number of children: N/A  . Years of education: N/A   Social History Main Topics  . Smoking status: Never Smoker  . Smokeless tobacco: Never Used  . Alcohol use Yes  . Drug use: No  . Sexual activity: Not Asked   Other Topics Concern  . None   Social History Narrative  . None    family history includes Heart attack in her mother; Heart failure in her mother; Stroke in her father.  Wt Readings from Last 3 Encounters:  07/30/16 283 lb (128.4 kg)  06/05/16 281 lb 14.4 oz (127.9 kg)  04/01/16 289 lb 8 oz (131.3 kg)    PHYSICAL EXAM BP 140/72   Pulse 66   Ht 5\' 6"  (1.676 m)   Wt 283 lb (128.4 kg)   BMI 45.68 kg/m  General appearance: alert, cooperative, appears stated age, no distress.  Morbidly obese. Well groomed. She walks with a walker, but required the use of a wheelchair to get her back to her car. HEENT: [place]/AT, EOMI, MMM, anicteric  sclera Neck: no adenopathy, no carotid bruit. Unable to assess JVD or HJR Lungs: clear to auscultation bilaterally, normal percussion bilaterally and non-labored Heart: regular rate and rhythm, S1 &S2 normal, no murmur, click, rub or gallop; unable to palpate PMI Abdomen: soft, non-tender; bowel sounds normal;  Unable to palpate any masses or organomegaly  Due to profound obesity Extremities: extremities normal, atraumatic, no cyanosis, and edema at least 1-2+ ankle edema with some mild venous stasis changes. Pulses: 2+ and symmetric Radial pulses. Very difficult to palpate pedal pulses. Skin: mobility and turgor normal and Mild bilateral venous stasis changes on both lower extremities. or  Neurologic: Mental status: Alert & oriented x 3, thought content appropriate; non-focal exam.  Pleasant mood & affect. Cranial nerves: normal (II-XII grossly intact)    Adult ECG Report  Rate: 64 ;  Rhythm: normal sinus rhythm and Normal axis, intervals and durations.;   Narrative Interpretation: Essentially normal EKG.   Other studies Reviewed: Additional studies/ records that were reviewed today include:  Recent Labs:  No results found for: HGBA1C Lab Results  Component Value Date   CREATININE 1.07 (H) 06/09/2015   BUN 15 06/09/2015   NA 137 06/09/2015   K 3.8 06/09/2015   CL 103 06/09/2015   CO2 22 06/09/2015   ASSESSMENT / PLAN: Problem List Items Addressed This Visit    Bilateral lower extremity edema (Chronic)     Hard to tell, doesn't sound like his heart failure related is probably more venous stasis related. We will get an assessment of her EF by Myoview. We may still need to consider an echocardiogram to get a full assessment of EF and filling pressures. I suspect that there is some component of right-sided failure from  Obesity hypoventilation syndrome.      Relevant Orders   Myocardial Perfusion Imaging   Dyspnea on exertion     Hard to tell if this is related or not to simply  deconditioning, obesity, OHS etc. Versus potentially coronary disease. Plan: Check a Myoview. I would also consider checking an echo, but she has had a relatively recent echocardiogram that was normal. If EF is reduced on the Myoview, then I would want to do an echocardiogram.      Relevant Orders   EKG 12-Lead (Completed)   Myocardial Perfusion Imaging   Morbid obesity ([place]) (Chronic)     She definitely needs to have the surgery in order to lose weight. I don't see her losing weight on her own otherwise. We will expedite preoperative evaluation with stress test.      Relevant Orders   EKG 12-Lead (Completed)   Myocardial Perfusion Imaging   Preop cardiovascular exam     Essentially is minimally active and therefore her exertional dyspnea is hard to determine. She doesn't really seem to haveanginal symptoms.She has had a relatively normal echocardiogram in the past and therefore I have no reason to suspect that her dyspnea is related to heart failure.  PREOPERATIVE CARDIAC RISK ASSESSMENT   Revised Cardiac Risk Index:  High Risk Surgery: yes; intraperitoneal  Defined as Intraperitoneal, intrathoracic or suprainguinal vascular  Active CAD: unsure  CHF: unsure; evaluation pending  Cerebrovascular Disease: no;   Diabetes: yes; On Insulin: yes  CKD (Cr >~ 2): no;   Total: For now 2 - but could be more pending evaluation  Estimated Risk of Adverse Outcome: At least Moderate  Estimated Risk of MI, PE, VF/VT (Cardiac Arrest), Complete Heart Block: 3-6 %  At this point, we really need to determine her exertional dyspnea.  We determine if this is ischemic or not.   since we need to have the result with some expediency, I can't wait until the high-risk CT scan is up and running to do a coronary artery CTA. We'll therefore need to go the next best test which would be a 2-Day  [employer]. This will give usan assessment of her EF as well as any potential ischemia. I am concerned  however about the potential for breast Or diaphragmatic attenuation.   Regardless, with the extent of symptom that she is having and her being a diabetic with insulin and hypertension as well as hyperlipidemia, we need to be certain.      Relevant Orders   EKG 12-Lead (Completed)   Myocardial Perfusion Imaging      Current medicines are reviewed at length with the patient today. (+/- concerns) n/a The following changes have been made: n/a  Patient Instructions  SCHEDULE AT [place] has requested that  you have a lexiscan myoview. For further information please visit [email or web address]. Please follow instruction sheet, as given.    Your physician recommends that you schedule a follow-up appointment in AFTER TEST COMPLETED   Studies Ordered:   Orders Placed This Encounter  Procedures  . Myocardial Perfusion Imaging  . EKG 12-Lead      Glenetta Hew, M.D., M.S. Interventional Cardiologist   Pager # [phone] Phone # [phone] [address] [place], [place] [address]

## 2016-07-30 NOTE — Patient Instructions (Signed)
SCHEDULE AT [facility] has requested that you have a lexiscan myoview. For further information please visit [email or web address]. Please follow instruction sheet, as given.    Your physician recommends that you schedule a follow-up appointment in AFTER TEST COMPLETED

## 2016-08-02 ENCOUNTER — Encounter: Payer: Self-pay | Admitting: Cardiology

## 2016-08-02 DIAGNOSIS — E108 Type 1 diabetes mellitus with unspecified complications: Secondary | ICD-10-CM | POA: Insufficient documentation

## 2016-08-02 NOTE — Assessment & Plan Note (Signed)
Essentially is minimally active and therefore her exertional dyspnea is hard to determine. She doesn't really seem to haveanginal symptoms.She has had a relatively normal echocardiogram in the past and therefore I have no reason to suspect that her dyspnea is related to heart failure.  PREOPERATIVE CARDIAC RISK ASSESSMENT   Revised Cardiac Risk Index:  High Risk Surgery: yes; intraperitoneal  Defined as Intraperitoneal, intrathoracic or suprainguinal vascular  Active CAD: unsure  CHF: unsure; evaluation pending  Cerebrovascular Disease: no;   Diabetes: yes; On Insulin: yes  CKD (Cr >~ 2): no;   Total: For now 2 - but could be more pending evaluation  Estimated Risk of Adverse Outcome: At least Moderate  Estimated Risk of MI, PE, VF/VT (Cardiac Arrest), Complete Heart Block: 3-6 %  At this point, we really need to determine her exertional dyspnea.  We determine if this is ischemic or not.   since we need to have the result with some expediency, I can't wait until the high-risk CT scan is up and running to do a coronary artery CTA. We'll therefore need to go the next best test which would be a 2-Day  [employer]. This will give usan assessment of her EF as well as any potential ischemia. I am concerned however about the potential for breast Or diaphragmatic attenuation.   Regardless, with the extent of symptom that she is having and her being a diabetic with insulin and hypertension as well as hyperlipidemia, we need to be certain.

## 2016-08-02 NOTE — Assessment & Plan Note (Signed)
Hard to tell if this is related or not to simply deconditioning, obesity, OHS etc. Versus potentially coronary disease. Plan: Check a Myoview. I would also consider checking an echo, but she has had a relatively recent echocardiogram that was normal. If EF is reduced on the Myoview, then I would want to do an echocardiogram.

## 2016-08-02 NOTE — Assessment & Plan Note (Signed)
Hard to tell, doesn't sound like his heart failure related is probably more venous stasis related. We will get an assessment of her EF by Myoview. We may still need to consider an echocardiogram to get a full assessment of EF and filling pressures. I suspect that there is some component of right-sided failure from  Obesity hypoventilation syndrome.

## 2016-08-02 NOTE — Assessment & Plan Note (Signed)
She definitely needs to have the surgery in order to lose weight. I don't see her losing weight on her own otherwise. We will expedite preoperative evaluation with stress test.

## 2016-08-05 ENCOUNTER — Ambulatory Visit (HOSPITAL_COMMUNITY)
Admission: RE | Admit: 2016-08-05 | Discharge: 2016-08-05 | Disposition: A | Discharge status: Home / Self Care | Payer: Medicare HMO | Source: Ambulatory Visit | Attending: Cardiovascular Disease | Admitting: Cardiovascular Disease

## 2016-08-05 DIAGNOSIS — Z6841 Body Mass Index (BMI) 40.0 and over, adult: Secondary | ICD-10-CM | POA: Insufficient documentation

## 2016-08-05 DIAGNOSIS — R0609 Other forms of dyspnea: Secondary | ICD-10-CM | POA: Insufficient documentation

## 2016-08-05 DIAGNOSIS — Z01810 Encounter for preprocedural cardiovascular examination: Secondary | ICD-10-CM | POA: Insufficient documentation

## 2016-08-05 DIAGNOSIS — R0600 Dyspnea, unspecified: Secondary | ICD-10-CM

## 2016-08-05 DIAGNOSIS — R600 Localized edema: Secondary | ICD-10-CM | POA: Diagnosis not present

## 2016-08-06 ENCOUNTER — Telehealth: Payer: Self-pay | Admitting: Skilled Nursing Facility1

## 2016-08-06 ENCOUNTER — Ambulatory Visit (HOSPITAL_COMMUNITY)
Admission: RE | Admit: 2016-08-06 | Discharge: 2016-08-06 | Disposition: A | Discharge status: Home / Self Care | Payer: Medicare HMO | Source: Ambulatory Visit | Attending: Cardiovascular Disease | Admitting: Cardiovascular Disease

## 2016-08-06 LAB — MYOCARDIAL PERFUSION IMAGING
CHL CUP NUCLEAR SRS: 0
CHL CUP NUCLEAR SSS: 1
CHL CUP RESTING HR STRESS: 62 {beats}/min
CSEPPHR: 78 {beats}/min
LV dias vol: 110 mL (ref 46–106)
LV sys vol: 54 mL
NUC STRESS TID: 0.93
SDS: 1

## 2016-08-06 MED ORDER — REGADENOSON 0.4 MG/5ML IV SOLN
0.4000 mg | Freq: Once | INTRAVENOUS | Status: AC
  Administered 2016-08-05: 0.4 mg via INTRAVENOUS

## 2016-08-06 MED ORDER — TECHNETIUM TC 99M TETROFOSMIN IV KIT
29.4000 | PACK | Freq: Once | INTRAVENOUS | Status: AC | PRN
  Administered 2016-08-05: 29.4 via INTRAVENOUS

## 2016-08-06 MED ORDER — TECHNETIUM TC 99M TETROFOSMIN IV KIT
32.3000 | PACK | Freq: Once | INTRAVENOUS | Status: AC | PRN
  Administered 2016-08-06: 32.3 via INTRAVENOUS

## 2016-08-06 NOTE — Telephone Encounter (Signed)
Pt called asking if she could have decaff coffee.  Dietitian advised she could have decaff coffee and went over appropriate breakfasts on the pre-op diet also reviewed appropriate yogurt options.

## 2016-08-08 ENCOUNTER — Telehealth: Payer: Self-pay | Admitting: Cardiology

## 2016-08-08 NOTE — Telephone Encounter (Signed)
New message   Pt wants call back from rn and that she is confused on why she had an appt schedule   She wants the results of the test

## 2016-08-08 NOTE — Telephone Encounter (Signed)
INFORMED PATIENT WILL CONTACT HER IN REGARDS TO RESULT AND TO SEE IF F/U APPOINTMENT IS NEEDED PATIENT VERBALIZED UNDERSTANDING.

## 2016-08-08 NOTE — Telephone Encounter (Signed)
Pt notified of low risk study. Pt is asking if she is cleared for surgery?

## 2016-08-09 NOTE — Telephone Encounter (Signed)
Based on the results of the echocardiogram from 2015, and the current Myoview results showing no evidence ischemia, I don't see any reason from a cardiac standpoint for her to not go forward for surgery.  It is still intraperitoneal surgery in a patient with history of diabetes on insulin. She therefore remains intermediate risk based on these findings. However, cardiac standpoint, she should be more on the low to intermediate risk because of the stress test result.  Postoperatively we can consider evaluating an echocardiogram, but for now I don't think is any reason to so preop.  Glenetta Hew, MD   Pls forward to:  PCP: Glendale Chard, MD  Surgeon: Dr. Hassell Done [facility]

## 2016-08-11 ENCOUNTER — Encounter: Payer: Medicare HMO | Attending: Surgery | Admitting: Registered"

## 2016-08-11 ENCOUNTER — Telehealth: Payer: Self-pay | Admitting: Skilled Nursing Facility1

## 2016-08-11 DIAGNOSIS — Z6841 Body Mass Index (BMI) 40.0 and over, adult: Secondary | ICD-10-CM | POA: Diagnosis not present

## 2016-08-11 DIAGNOSIS — E669 Obesity, unspecified: Secondary | ICD-10-CM

## 2016-08-11 DIAGNOSIS — Z713 Dietary counseling and surveillance: Secondary | ICD-10-CM | POA: Diagnosis not present

## 2016-08-11 DIAGNOSIS — E119 Type 2 diabetes mellitus without complications: Secondary | ICD-10-CM | POA: Insufficient documentation

## 2016-08-11 NOTE — Telephone Encounter (Signed)
Sent to PCP: Glendale Chard, [place] Surgeon: Dr. Hassell Done [facility] via [facility]

## 2016-08-11 NOTE — Progress Notes (Signed)
  Pre-Operative Nutrition Class:  Appt start time: 8:15   End time:  9:15  Patient was seen on 08/11/2016 for Pre-Operative Bariatric Surgery Education [facility].   Surgery date: to be determined Surgery type: sleeve gastrectomy Start weight at [facility]: 291.1 Weight today: 272.5  TANITA  BODY COMP RESULTS  08/11/2016   BMI (kg/m^2) N/A   Fat Mass (lbs)    Fat Free Mass (lbs)    Total Body Water (lbs)    Samples given per MNT protocol. Patient educated on appropriate usage: Bariatric Advantage Multivitamin Lot #[id number] Exp: 07/2017  Bariatric Advantage Calcium Citrate Lot #[id number] Exp: 11/06/2016  Bariatric Fusion Calcium Soft Chews  Lot #[id number] Exp: 06/25/2017  Renee Pain Protein Shake Lot #[id number] Exp: 12/28/2016  The following the learning objectives were met by the patient during this course:  Identify Pre-Op Dietary Goals and will begin 2 weeks pre-operatively  Identify appropriate sources of fluids and proteins   State protein recommendations and appropriate sources pre and post-operatively  Identify Post-Operative Dietary Goals and will follow for 2 weeks post-operatively  Identify appropriate multivitamin and calcium sources  Describe the need for physical activity post-operatively and will follow MD recommendations  State when to call healthcare provider regarding medication questions or post-operative complications  Handouts given during class include:  Pre-Op Bariatric Surgery Diet Handout  Protein Shake Handout  Post-Op Bariatric Surgery Nutrition Handout  BELT Program Information Flyer  Support Group Information Flyer  [facility] Bariatric Supplements Price List  Follow-Up Plan: Patient will follow-up at [facility] 2 weeks post operatively for diet advancement per MD.

## 2016-08-11 NOTE — Telephone Encounter (Signed)
Pt called asking about multivitamins. Pt read off all of the nutrient information from her supplement bottle while the dietitian tried to tell her it was not appropriate after surgery. Dietitian stated her woman's one a day from [place] was fine before surgery but needed a more specific vitamin after surgery. Pt states she has lost 21 pounds.

## 2016-08-12 ENCOUNTER — Telehealth: Payer: Self-pay | Admitting: Cardiology

## 2016-08-12 ENCOUNTER — Telehealth: Payer: Self-pay | Admitting: Registered"

## 2016-08-12 DIAGNOSIS — M179 Osteoarthritis of knee, unspecified: Secondary | ICD-10-CM | POA: Diagnosis not present

## 2016-08-12 DIAGNOSIS — N08 Glomerular disorders in diseases classified elsewhere: Secondary | ICD-10-CM | POA: Diagnosis not present

## 2016-08-12 DIAGNOSIS — N183 Chronic kidney disease, stage 3 (moderate): Secondary | ICD-10-CM | POA: Diagnosis not present

## 2016-08-12 DIAGNOSIS — E1122 Type 2 diabetes mellitus with diabetic chronic kidney disease: Secondary | ICD-10-CM | POA: Diagnosis not present

## 2016-08-12 NOTE — Telephone Encounter (Signed)
-----   Message from Skeet Latch, MD sent at 08/06/2016  9:28 PM EDT ----- Low risk stress test

## 2016-08-12 NOTE — Telephone Encounter (Signed)
Clearance sent to Dr Stann Mainland yesterday via Epic

## 2016-08-12 NOTE — Telephone Encounter (Signed)
Pt called inquiring about being able to eat pickled beets during pre-op diet. Pt was encouraged to eat her beets as long as sugar is not added to them.

## 2016-08-12 NOTE — Telephone Encounter (Signed)
New message     Pt is calling for myocardial perfusion results

## 2016-08-12 NOTE — Telephone Encounter (Signed)
Advised patient of results.  

## 2016-08-13 ENCOUNTER — Telehealth: Payer: Self-pay | Admitting: *Deleted

## 2016-08-13 NOTE — Telephone Encounter (Signed)
SPOKE TO PATIENT . APPOINTMENT MADE FOR SEPT 2018 AFTER SURGERY  PATIENT VERBALIZED UNDERSTANDING

## 2016-08-13 NOTE — Telephone Encounter (Signed)
LEFT MESSAGE TO CALL BACK    - IN REGARDING RESULT AND MAKING A FOLLOW UP APPOINTMENT

## 2016-08-13 NOTE — Telephone Encounter (Signed)
Follow up    Pt is calling back

## 2016-08-14 ENCOUNTER — Ambulatory Visit: Payer: Medicare HMO | Admitting: Cardiology

## 2016-08-14 NOTE — Progress Notes (Signed)
Please place orders in EPIC as patient is being scheduled for a pre-op appointment! Thank you! 

## 2016-08-19 NOTE — Progress Notes (Signed)
Please place orders in epic pt. Has preop 08/21/16 at 0900. Thank you very much

## 2016-08-19 NOTE — Patient Instructions (Signed)
Teresa Golden  08/19/2016   Your procedure is scheduled on: 08/26/16  Report to [facility] Main  Entrance Take [place]  elevators to 3rd floor to  [place] at    1115 AM.   Call this number if you have problems the morning of surgery [phone]   Remember: ONLY 1 PERSON MAY GO WITH YOU TO SHORT STAY TO GET  READY MORNING OF [place].  Do not eat food or drink liquids :After Midnight.     Take these medicines the morning of surgery with A SIP OF WATER: SYNTHROID, GABAPENTIN ,COREG DO NOT TAKE ANY DIABETIC MEDICATIONS DAY OF YOUR SURGERY                               You may not have any metal on your body including hair pins and              piercings  Do not wear jewelry, make-up, lotions, powders or perfumes, deodorant             Do not wear nail polish.  Do not shave  48 hours prior to surgery.              Do not bring valuables to the hospital. [place].  Contacts, dentures or bridgework may not be worn into surgery.  Leave suitcase in the car. After surgery it may be brought to your room.                  Please read over the following fact sheets you were given: _____________________________________________________________________            [facility] - Preparing for Surgery Before surgery, you can play an important role.  Because skin is not sterile, your skin needs to be as free of germs as possible.  You can reduce the number of germs on your skin by washing with CHG (chlorahexidine gluconate) soap before surgery.  CHG is an antiseptic cleaner which kills germs and bonds with the skin to continue killing germs even after washing. Please DO NOT use if you have an allergy to CHG or antibacterial soaps.  If your skin becomes reddened/irritated stop using the CHG and inform your nurse when you arrive at Short Stay. Do not shave (including legs and underarms) for at least 48 hours prior to  the first CHG shower.  You may shave your face/neck. Please follow these instructions carefully:  1.  Shower with CHG Soap the night before surgery and the  morning of Surgery.  2.  If you choose to wash your hair, wash your hair first as usual with your  normal  shampoo.  3.  After you shampoo, rinse your hair and body thoroughly to remove the  shampoo.                           4.  Use CHG as you would any other liquid soap.  You can apply chg directly  to the skin and wash                       Gently with a scrungie or clean washcloth.  5.  Apply the CHG Soap to your body ONLY FROM THE NECK DOWN.   Do not use on face/ open                           Wound or open sores. Avoid contact with eyes, ears mouth and genitals (private parts).                       Wash face,  Genitals (private parts) with your normal soap.             6.  Wash thoroughly, paying special attention to the area where your surgery  will be performed.  7.  Thoroughly rinse your body with warm water from the neck down.  8.  DO NOT shower/wash with your normal soap after using and rinsing off  the CHG Soap.                9.  Pat yourself dry with a clean towel.            10.  Wear clean pajamas.            11.  Place clean sheets on your bed the night of your first shower and do not  sleep with pets. Day of Surgery : Do not apply any lotions/deodorants the morning of surgery.  Please wear clean clothes to the hospital/surgery center.  FAILURE TO FOLLOW THESE INSTRUCTIONS MAY RESULT IN THE CANCELLATION OF YOUR SURGERY PATIENT SIGNATURE_________________________________  NURSE SIGNATURE__________________________________  ________________________________________________________________________    CLEAR LIQUID DIET   Till     700am then nothing by mouth   Foods Allowed                                                                     Foods Excluded  Coffee and tea, regular and decaf                              liquids that you cannot  Plain Jell-O in any flavor                                             see through such as: Fruit ices (not with fruit pulp)                                     milk, soups, orange juice  Iced Popsicles                                    All solid food Carbonated beverages, regular and diet                                    Cranberry, grape and apple juices Sports drinks like Gatorade Lightly seasoned clear broth or consume(fat free)  Sugar, honey syrup  Sample Menu Breakfast                                Lunch                                     Supper Cranberry juice                    Beef broth                            Chicken broth Jell-O                                     Grape juice                           Apple juice Coffee or tea                        Jell-O                                      Popsicle                                                Coffee or tea                        Coffee or tea  _____________________________________________________________________  How to Manage Your Diabetes Before and After Surgery  Why is it important to control my blood sugar before and after surgery? . Improving blood sugar levels before and after surgery helps healing and can limit problems. . A way of improving blood sugar control is eating a healthy diet by: o  Eating less sugar and carbohydrates o  Increasing activity/exercise o  Talking with your doctor about reaching your blood sugar goals . High blood sugars (greater than 180 mg/dL) can raise your risk of infections and slow your recovery, so you will need to focus on controlling your diabetes during the weeks before surgery. . Make sure that the doctor who takes care of your diabetes knows about your planned surgery including the date and location.  How do I manage my blood sugar before surgery? . Check your blood sugar at least 4 times a day, starting 2 days before surgery, to make sure that  the level is not too high or low. o Check your blood sugar the morning of your surgery when you wake up and every 2 hours until you get to the Short Stay unit. . If your blood sugar is less than 70 mg/dL, you will need to treat for low blood sugar: o Do not take insulin. o Treat a low blood sugar (less than 70 mg/dL) with  cup of clear juice (cranberry or apple), 4 glucose tablets, OR glucose gel. o Recheck blood sugar in 15 minutes after treatment (to make sure it is greater than 70  mg/dL). If your blood sugar is not greater than 70 mg/dL on recheck, call [phone] for further instructions. . Report your blood sugar to the short stay nurse when you get to Short Stay.  . If you are admitted to the hospital after surgery: o Your blood sugar will be checked by the staff and you will probably be given insulin after surgery (instead of oral diabetes medicines) to make sure you have good blood sugar levels. o The goal for blood sugar control after surgery is 80-180 mg/dL.   WHAT DO I DO ABOUT MY DIABETES MEDICATION?  [name] Do not take oral diabetes medicines (pills) the morning of surgery.  . THE DAY BEFORE SURGERY, take  70 % of your dose of insulin         . THE MORNING OF SURGERY, take  0 units of         insulin.  . The day of surgery, do not take other diabetes injectables, including Byetta (exenatide), Bydureon (exenatide ER), Victoza (liraglutide), or Trulicity (dulaglutide).  . If your CBG is greater than 220 mg/dL, you may take  of your sliding scale  . (correction) dose of insulin.    Patient Signature:  Date:   Nurse Signature:  Date:   Reviewed and Endorsed by [facility] Patient Education Committee, August 2015

## 2016-08-21 ENCOUNTER — Encounter (HOSPITAL_COMMUNITY)
Admission: RE | Admit: 2016-08-21 | Discharge: 2016-08-21 | Disposition: A | Discharge status: Home / Self Care | Payer: Medicare HMO | Source: Ambulatory Visit | Attending: Surgery | Admitting: Surgery

## 2016-08-21 ENCOUNTER — Encounter (HOSPITAL_COMMUNITY): Payer: Self-pay

## 2016-08-21 DIAGNOSIS — E669 Obesity, unspecified: Secondary | ICD-10-CM | POA: Insufficient documentation

## 2016-08-21 DIAGNOSIS — Z79899 Other long term (current) drug therapy: Secondary | ICD-10-CM | POA: Diagnosis not present

## 2016-08-21 DIAGNOSIS — Z01818 Encounter for other preprocedural examination: Secondary | ICD-10-CM | POA: Diagnosis present

## 2016-08-21 DIAGNOSIS — Z794 Long term (current) use of insulin: Secondary | ICD-10-CM | POA: Diagnosis not present

## 2016-08-21 HISTORY — DX: Endometriosis, unspecified: N80.9

## 2016-08-21 HISTORY — DX: Hypothyroidism, unspecified: E03.9

## 2016-08-21 HISTORY — DX: Dyspnea, unspecified: R06.00

## 2016-08-21 LAB — BASIC METABOLIC PANEL
Anion gap: 7 (ref 5–15)
BUN: 21 mg/dL — AB (ref 6–20)
CHLORIDE: 109 mmol/L (ref 101–111)
CO2: 26 mmol/L (ref 22–32)
CREATININE: 1.04 mg/dL — AB (ref 0.44–1.00)
Calcium: 9.2 mg/dL (ref 8.9–10.3)
GFR calc Af Amer: >60 mL/min (ref >60)
GFR calc non Af Amer: 52 mL/min — ABNORMAL LOW (ref >60)
GLUCOSE: 76 mg/dL (ref 65–99)
Potassium: 4.2 mmol/L (ref 3.5–5.1)
Sodium: 142 mmol/L (ref 135–145)

## 2016-08-21 LAB — CBC
HCT: 36 % (ref 36.0–46.0)
Hemoglobin: 12.2 g/dL (ref 12.0–15.0)
MCH: 27.8 pg (ref 26.0–34.0)
MCHC: 33.9 g/dL (ref 30.0–36.0)
MCV: 82 fL (ref 78.0–100.0)
PLATELETS: 182 10*3/uL (ref 150–400)
RBC: 4.39 MIL/uL (ref 3.87–5.11)
RDW: 14.2 % (ref 11.5–15.5)
WBC: 8.2 10*3/uL (ref 4.0–10.5)

## 2016-08-21 LAB — GLUCOSE, CAPILLARY
GLUCOSE-CAPILLARY: 63 mg/dL — AB (ref 65–99)
Glucose-Capillary: 61 mg/dL — ABNORMAL LOW (ref 65–99)
Glucose-Capillary: 63 mg/dL — ABNORMAL LOW (ref 65–99)
Glucose-Capillary: 68 mg/dL (ref 65–99)
Glucose-Capillary: 77 mg/dL (ref 65–99)

## 2016-08-21 NOTE — Progress Notes (Addendum)
FYI at preop apt. For gastric sleeve surgery 08/26/16 Ms. Lanecia Whites BS was 4, I gave her OJ and 1 pk. Of graham crackers. Rechecked her BS after 15 min. And it was 63. I gave  Ms. Pen more OJ and then rechecked BS and it was 61. She drank  a protein shake she had in her bag. And I gave her a piece of candy. Her BS finally came up to 77. She is on novolog 70/30 35 units in the am and 40 units in evening . She had taken her insulin this am  And also had breakfast (sausage and eggs) . I am concerned her dose may need to be lowered more . And also concerned that her surgery is at 1:15 pm although per anesthesia guidelines I am allowing her clear liquids until 0700. Also per anesthesia guidelines her 70/30 dose will be decreased by 70% the day before surgery.  Please contact pt. With any further recommendations. Thank you! This note has been routed via epic to pcp Dr. Baird Cancer and surgeon Dr. Hassell Done

## 2016-08-21 NOTE — Progress Notes (Signed)
ekg 07/30/16 epic  stress 08/05/16 epic cxr 11/14/16 epic

## 2016-08-22 ENCOUNTER — Telehealth: Payer: Self-pay | Admitting: Registered"

## 2016-08-22 LAB — HEMOGLOBIN A1C
Hgb A1c MFr Bld: 6.6 % — ABNORMAL HIGH (ref 4.8–5.6)
Mean Plasma Glucose: 143 mg/dL

## 2016-08-22 NOTE — Telephone Encounter (Signed)
RD received message that pt wanted to talk about vitamins. RD returned phone call and unable to speak with pt; left voicemail.

## 2016-08-25 ENCOUNTER — Telehealth: Payer: Self-pay | Admitting: Registered"

## 2016-08-25 NOTE — Telephone Encounter (Signed)
Pt called with questions about vitamins to verify that she will be taking the right amounts of Vitamin B12 and calcium after surgery with supplements that she has already purchased. Pt's surgery date is scheduled for tomorrow, 08/26/2016.

## 2016-08-26 ENCOUNTER — Inpatient Hospital Stay (HOSPITAL_COMMUNITY)
Admission: RE | Admit: 2016-08-26 | Discharge: 2016-08-30 | DRG: 621 | Disposition: A | Discharge status: Home / Self Care | Payer: Medicare HMO | Source: Ambulatory Visit | Attending: Surgery | Admitting: Surgery

## 2016-08-26 ENCOUNTER — Inpatient Hospital Stay (HOSPITAL_COMMUNITY): Payer: Medicare HMO | Admitting: Certified Registered Nurse Anesthetist

## 2016-08-26 ENCOUNTER — Encounter (HOSPITAL_COMMUNITY): Payer: Self-pay | Admitting: *Deleted

## 2016-08-26 ENCOUNTER — Ambulatory Visit: Payer: Self-pay | Admitting: Surgery

## 2016-08-26 ENCOUNTER — Encounter (HOSPITAL_COMMUNITY)
Admission: RE | Disposition: A | Discharge status: Home / Self Care | Payer: Self-pay | Source: Ambulatory Visit | Attending: Surgery

## 2016-08-26 DIAGNOSIS — K2950 Unspecified chronic gastritis without bleeding: Secondary | ICD-10-CM | POA: Diagnosis not present

## 2016-08-26 DIAGNOSIS — Z9884 Bariatric surgery status: Secondary | ICD-10-CM

## 2016-08-26 DIAGNOSIS — E109 Type 1 diabetes mellitus without complications: Secondary | ICD-10-CM | POA: Diagnosis present

## 2016-08-26 DIAGNOSIS — Z6841 Body Mass Index (BMI) 40.0 and over, adult: Secondary | ICD-10-CM | POA: Diagnosis not present

## 2016-08-26 DIAGNOSIS — K219 Gastro-esophageal reflux disease without esophagitis: Secondary | ICD-10-CM | POA: Diagnosis not present

## 2016-08-26 DIAGNOSIS — E119 Type 2 diabetes mellitus without complications: Secondary | ICD-10-CM | POA: Diagnosis not present

## 2016-08-26 DIAGNOSIS — Z794 Long term (current) use of insulin: Secondary | ICD-10-CM | POA: Diagnosis not present

## 2016-08-26 HISTORY — PX: LAPAROSCOPIC GASTRIC SLEEVE RESECTION: SHX5895

## 2016-08-26 LAB — COMPREHENSIVE METABOLIC PANEL
ALBUMIN: 3.8 g/dL (ref 3.5–5.0)
ALK PHOS: 55 U/L (ref 38–126)
ALT: 23 U/L (ref 14–54)
AST: 37 U/L (ref 15–41)
Anion gap: 9 (ref 5–15)
BUN: 21 mg/dL — AB (ref 6–20)
CALCIUM: 9.3 mg/dL (ref 8.9–10.3)
CHLORIDE: 107 mmol/L (ref 101–111)
CO2: 23 mmol/L (ref 22–32)
CREATININE: 0.91 mg/dL (ref 0.44–1.00)
GFR calc Af Amer: >60 mL/min (ref >60)
GFR calc non Af Amer: >60 mL/min (ref >60)
GLUCOSE: 110 mg/dL — AB (ref 65–99)
Potassium: 4.1 mmol/L (ref 3.5–5.1)
SODIUM: 139 mmol/L (ref 135–145)
Total Bilirubin: 0.9 mg/dL (ref 0.3–1.2)
Total Protein: 7.4 g/dL (ref 6.5–8.1)

## 2016-08-26 LAB — CBC WITH DIFFERENTIAL/PLATELET
BASOS ABS: 0 10*3/uL (ref 0.0–0.1)
Basophils Relative: 1 %
EOS ABS: 0.2 10*3/uL (ref 0.0–0.7)
EOS PCT: 3 %
HCT: 35.6 % — ABNORMAL LOW (ref 36.0–46.0)
HEMOGLOBIN: 12.1 g/dL (ref 12.0–15.0)
LYMPHS ABS: 2.5 10*3/uL (ref 0.7–4.0)
Lymphocytes Relative: 32 %
MCH: 27.8 pg (ref 26.0–34.0)
MCHC: 34 g/dL (ref 30.0–36.0)
MCV: 81.7 fL (ref 78.0–100.0)
Monocytes Absolute: 0.6 10*3/uL (ref 0.1–1.0)
Monocytes Relative: 8 %
NEUTROS PCT: 56 %
Neutro Abs: 4.4 10*3/uL (ref 1.7–7.7)
PLATELETS: 187 10*3/uL (ref 150–400)
RBC: 4.36 MIL/uL (ref 3.87–5.11)
RDW: 14.3 % (ref 11.5–15.5)
WBC: 7.7 10*3/uL (ref 4.0–10.5)

## 2016-08-26 LAB — GLUCOSE, CAPILLARY
GLUCOSE-CAPILLARY: 215 mg/dL — AB (ref 65–99)
Glucose-Capillary: 114 mg/dL — ABNORMAL HIGH (ref 65–99)
Glucose-Capillary: 120 mg/dL — ABNORMAL HIGH (ref 65–99)
Glucose-Capillary: 157 mg/dL — ABNORMAL HIGH (ref 65–99)
Glucose-Capillary: 199 mg/dL — ABNORMAL HIGH (ref 65–99)

## 2016-08-26 LAB — HEMOGLOBIN AND HEMATOCRIT, BLOOD
HEMATOCRIT: 36.3 % (ref 36.0–46.0)
HEMOGLOBIN: 12.7 g/dL (ref 12.0–15.0)

## 2016-08-26 SURGERY — GASTRECTOMY, SLEEVE, LAPAROSCOPIC
Anesthesia: General

## 2016-08-26 MED ORDER — CHLORHEXIDINE GLUCONATE CLOTH 2 % EX PADS: 6.0000 | MEDICATED_PAD | Freq: Once | CUTANEOUS | Status: DC

## 2016-08-26 MED ORDER — DEXAMETHASONE SODIUM PHOSPHATE 10 MG/ML IJ SOLN
INTRAMUSCULAR | Status: DC | PRN
  Administered 2016-08-26: 5 mg via INTRAVENOUS

## 2016-08-26 MED ORDER — HEPARIN SODIUM (PORCINE) 5000 UNIT/ML IJ SOLN
5000.0000 [IU] | Freq: Three times a day (TID) | INTRAMUSCULAR | Status: DC
  Administered 2016-08-26 – 2016-08-30 (×11): 5000 [IU] via SUBCUTANEOUS
  Filled 2016-08-26 (×11): qty 1

## 2016-08-26 MED ORDER — PROPOFOL 10 MG/ML IV BOLUS
INTRAVENOUS | Status: DC | PRN
  Administered 2016-08-26: 115 mg via INTRAVENOUS

## 2016-08-26 MED ORDER — KCL IN DEXTROSE-NACL 20-5-0.45 MEQ/L-%-% IV SOLN
INTRAVENOUS | Status: DC
  Administered 2016-08-26: 1000 mL via INTRAVENOUS
  Administered 2016-08-27: 100 mL/h via INTRAVENOUS
  Administered 2016-08-27 – 2016-08-29 (×4): via INTRAVENOUS
  Filled 2016-08-26 (×9): qty 1000

## 2016-08-26 MED ORDER — LABETALOL HCL 5 MG/ML IV SOLN
INTRAVENOUS | Status: AC
  Filled 2016-08-26: qty 4

## 2016-08-26 MED ORDER — OXYCODONE HCL 5 MG/5ML PO SOLN
5.0000 mg | ORAL | Status: DC | PRN
  Administered 2016-08-27 (×2): 5 mg via ORAL
  Administered 2016-08-29: 10 mg via ORAL
  Administered 2016-08-29: 5 mg via ORAL
  Administered 2016-08-30 (×2): 10 mg via ORAL
  Filled 2016-08-26 (×3): qty 5
  Filled 2016-08-26 (×3): qty 10

## 2016-08-26 MED ORDER — ROCURONIUM BROMIDE 50 MG/5ML IV SOSY
PREFILLED_SYRINGE | INTRAVENOUS | Status: DC | PRN
  Administered 2016-08-26 (×4): 10 mg via INTRAVENOUS
  Administered 2016-08-26: 50 mg via INTRAVENOUS

## 2016-08-26 MED ORDER — SUGAMMADEX SODIUM 500 MG/5ML IV SOLN
INTRAVENOUS | Status: AC
  Filled 2016-08-26: qty 5

## 2016-08-26 MED ORDER — BUPIVACAINE LIPOSOME 1.3 % IJ SUSP
INTRAMUSCULAR | Status: DC | PRN
  Administered 2016-08-26: 20 mL

## 2016-08-26 MED ORDER — PROPOFOL 10 MG/ML IV BOLUS
INTRAVENOUS | Status: AC
  Filled 2016-08-26: qty 20

## 2016-08-26 MED ORDER — FENTANYL CITRATE (PF) 250 MCG/5ML IJ SOLN
INTRAMUSCULAR | Status: AC
  Filled 2016-08-26: qty 5

## 2016-08-26 MED ORDER — LACTATED RINGERS IV SOLN
INTRAVENOUS | Status: DC
  Administered 2016-08-26 (×2): via INTRAVENOUS

## 2016-08-26 MED ORDER — LIDOCAINE 2% (20 MG/ML) 5 ML SYRINGE
INTRAMUSCULAR | Status: AC
  Filled 2016-08-26: qty 5

## 2016-08-26 MED ORDER — EPHEDRINE SULFATE 50 MG/ML IJ SOLN
INTRAMUSCULAR | Status: DC | PRN
  Administered 2016-08-26 (×4): 10 mg via INTRAVENOUS

## 2016-08-26 MED ORDER — FENTANYL CITRATE (PF) 100 MCG/2ML IJ SOLN
INTRAMUSCULAR | Status: AC
  Filled 2016-08-26: qty 2

## 2016-08-26 MED ORDER — SUGAMMADEX SODIUM 500 MG/5ML IV SOLN
INTRAVENOUS | Status: DC | PRN
  Administered 2016-08-26: 250 mg via INTRAVENOUS

## 2016-08-26 MED ORDER — PREMIER PROTEIN SHAKE
2.0000 [oz_av] | ORAL | Status: DC
  Administered 2016-08-27 – 2016-08-30 (×24): 2 [oz_av] via ORAL
  Filled 2016-08-26 (×13): qty 325.31

## 2016-08-26 MED ORDER — ONDANSETRON HCL 4 MG/2ML IJ SOLN: 4.0000 mg | INTRAMUSCULAR | Status: DC | PRN

## 2016-08-26 MED ORDER — CARVEDILOL PHOSPHATE ER 20 MG PO CP24
20.0000 mg | ORAL_CAPSULE | Freq: Every day | ORAL | Status: DC
  Administered 2016-08-27 – 2016-08-30 (×4): 20 mg via ORAL
  Filled 2016-08-26 (×4): qty 1

## 2016-08-26 MED ORDER — FENTANYL CITRATE (PF) 100 MCG/2ML IJ SOLN
INTRAMUSCULAR | Status: DC | PRN
  Administered 2016-08-26 (×6): 50 ug via INTRAVENOUS

## 2016-08-26 MED ORDER — PANTOPRAZOLE SODIUM 40 MG IV SOLR
40.0000 mg | Freq: Every day | INTRAVENOUS | Status: DC
  Administered 2016-08-26 – 2016-08-28 (×2): 40 mg via INTRAVENOUS
  Filled 2016-08-26 (×2): qty 40

## 2016-08-26 MED ORDER — ONDANSETRON HCL 4 MG/2ML IJ SOLN
INTRAMUSCULAR | Status: DC | PRN
Start: 2016-08-26 — End: 2016-08-26
  Administered 2016-08-26: 4 mg via INTRAVENOUS

## 2016-08-26 MED ORDER — LIDOCAINE 2% (20 MG/ML) 5 ML SYRINGE
INTRAMUSCULAR | Status: DC | PRN
  Administered 2016-08-26: 100 mg via INTRAVENOUS

## 2016-08-26 MED ORDER — LACTATED RINGERS IR SOLN
Status: DC | PRN
  Administered 2016-08-26: 1000 mL

## 2016-08-26 MED ORDER — ONDANSETRON HCL 4 MG/2ML IJ SOLN
INTRAMUSCULAR | Status: AC
  Filled 2016-08-26: qty 2

## 2016-08-26 MED ORDER — CEFOTETAN DISODIUM-DEXTROSE 2-2.08 GM-% IV SOLR
2.0000 g | INTRAVENOUS | Status: AC
  Administered 2016-08-26: 2 g via INTRAVENOUS
  Filled 2016-08-26: qty 50

## 2016-08-26 MED ORDER — DEXAMETHASONE SODIUM PHOSPHATE 10 MG/ML IJ SOLN
INTRAMUSCULAR | Status: AC
  Filled 2016-08-26: qty 1

## 2016-08-26 MED ORDER — APREPITANT 40 MG PO CAPS: 40.0000 mg | ORAL_CAPSULE | ORAL | Status: DC

## 2016-08-26 MED ORDER — MIDAZOLAM HCL 2 MG/2ML IJ SOLN
INTRAMUSCULAR | Status: AC
  Filled 2016-08-26: qty 2

## 2016-08-26 MED ORDER — INSULIN ASPART 100 UNIT/ML ~~LOC~~ SOLN
0.0000 [IU] | SUBCUTANEOUS | Status: DC
Start: 2016-08-26 — End: 2016-08-30
  Administered 2016-08-26: 4 [IU] via SUBCUTANEOUS
  Administered 2016-08-27 – 2016-08-28 (×9): 7 [IU] via SUBCUTANEOUS
  Administered 2016-08-28 (×2): 4 [IU] via SUBCUTANEOUS
  Administered 2016-08-28: 11 [IU] via SUBCUTANEOUS
  Administered 2016-08-29 – 2016-08-30 (×7): 4 [IU] via SUBCUTANEOUS
  Administered 2016-08-30: 7 [IU] via SUBCUTANEOUS
  Administered 2016-08-30: 4 [IU] via SUBCUTANEOUS

## 2016-08-26 MED ORDER — MORPHINE SULFATE (PF) 2 MG/ML IV SOLN
1.0000 mg | INTRAVENOUS | Status: DC | PRN
  Filled 2016-08-26: qty 1

## 2016-08-26 MED ORDER — HEPARIN SODIUM (PORCINE) 5000 UNIT/ML IJ SOLN
5000.0000 [IU] | INTRAMUSCULAR | Status: AC
  Administered 2016-08-26: 5000 [IU] via SUBCUTANEOUS
  Filled 2016-08-26: qty 1

## 2016-08-26 MED ORDER — 0.9 % SODIUM CHLORIDE (POUR BTL) OPTIME
TOPICAL | Status: DC | PRN
  Administered 2016-08-26: 1000 mL

## 2016-08-26 MED ORDER — APREPITANT 40 MG PO CAPS
40.0000 mg | ORAL_CAPSULE | ORAL | Status: AC
  Administered 2016-08-26: 40 mg via ORAL
  Filled 2016-08-26 (×2): qty 1

## 2016-08-26 MED ORDER — LABETALOL HCL 5 MG/ML IV SOLN
INTRAVENOUS | Status: DC | PRN
  Administered 2016-08-26 (×7): 2.5 mg via INTRAVENOUS

## 2016-08-26 MED ORDER — BUPIVACAINE LIPOSOME 1.3 % IJ SUSP
20.0000 mL | Freq: Once | INTRAMUSCULAR | Status: DC
  Filled 2016-08-26: qty 20

## 2016-08-26 MED ORDER — ACETAMINOPHEN 160 MG/5ML PO SOLN: 325.0000 mg | ORAL | Status: DC | PRN

## 2016-08-26 MED ORDER — ACETAMINOPHEN 325 MG PO TABS: 650.0000 mg | ORAL_TABLET | ORAL | Status: DC | PRN

## 2016-08-26 SURGICAL SUPPLY — 55 items
APPLICATOR COTTON TIP 6IN STRL (MISCELLANEOUS) IMPLANT
APPLIER CLIP 5 13 M/L LIGAMAX5 (MISCELLANEOUS)
APPLIER CLIP ROT 10 11.4 M/L (STAPLE)
APPLIER CLIP ROT 13.4 12 LRG (CLIP)
BLADE SURG 15 STRL LF DISP TIS (BLADE) ×1 IMPLANT
BLADE SURG 15 STRL SS (BLADE) ×1
CABLE HIGH FREQUENCY MONO STRZ (ELECTRODE) ×2 IMPLANT
CLIP APPLIE 5 13 M/L LIGAMAX5 (MISCELLANEOUS) IMPLANT
CLIP APPLIE ROT 10 11.4 M/L (STAPLE) IMPLANT
CLIP APPLIE ROT 13.4 12 LRG (CLIP) IMPLANT
DERMABOND ADVANCED (GAUZE/BANDAGES/DRESSINGS) ×1
DERMABOND ADVANCED .7 DNX12 (GAUZE/BANDAGES/DRESSINGS) ×1 IMPLANT
DEVICE SUT QUICK LOAD TK 5 (STAPLE) IMPLANT
DEVICE SUT TI-KNOT TK 5X26 (MISCELLANEOUS) IMPLANT
DEVICE SUTURE ENDOST 10MM (ENDOMECHANICALS) IMPLANT
DEVICE TROCAR PUNCTURE CLOSURE (ENDOMECHANICALS) ×2 IMPLANT
DISSECTOR BLUNT TIP ENDO 5MM (MISCELLANEOUS) IMPLANT
ELECT REM PT RETURN 15FT ADLT (MISCELLANEOUS) ×2 IMPLANT
GAUZE SPONGE 4X4 12PLY STRL (GAUZE/BANDAGES/DRESSINGS) IMPLANT
GLOVE BIOGEL M 8.0 STRL (GLOVE) ×2 IMPLANT
GOWN STRL REUS W/TWL XL LVL3 (GOWN DISPOSABLE) ×8 IMPLANT
HANDLE STAPLE EGIA 4 XL (STAPLE) ×2 IMPLANT
HOVERMATT SINGLE USE (MISCELLANEOUS) ×2 IMPLANT
KIT BASIN OR (CUSTOM PROCEDURE TRAY) ×2 IMPLANT
MARKER SKIN DUAL TIP RULER LAB (MISCELLANEOUS) ×2 IMPLANT
NEEDLE SPNL 22GX3.5 QUINCKE BK (NEEDLE) ×2 IMPLANT
PACK UNIVERSAL I (CUSTOM PROCEDURE TRAY) ×2 IMPLANT
RELOAD TRI 45 ART MED THCK BLK (STAPLE) ×4 IMPLANT
RELOAD TRI 45 ART MED THCK PUR (STAPLE) IMPLANT
RELOAD TRI 60 ART MED THCK BLK (STAPLE) ×4 IMPLANT
RELOAD TRI 60 ART MED THCK PUR (STAPLE) ×2 IMPLANT
SCISSORS LAP 5X45 EPIX DISP (ENDOMECHANICALS) ×2 IMPLANT
SET IRRIG TUBING LAPAROSCOPIC (IRRIGATION / IRRIGATOR) ×2 IMPLANT
SHEARS HARMONIC ACE PLUS 45CM (MISCELLANEOUS) ×2 IMPLANT
SLEEVE ADV FIXATION 5X100MM (TROCAR) ×4 IMPLANT
SLEEVE GASTRECTOMY 36FR VISIGI (MISCELLANEOUS) ×2 IMPLANT
SOLUTION ANTI FOG 6CC (MISCELLANEOUS) ×2 IMPLANT
SPONGE LAP 18X18 X RAY DECT (DISPOSABLE) ×2 IMPLANT
STAPLER VISISTAT 35W (STAPLE) ×2 IMPLANT
SUT SURGIDAC NAB ES-9 0 48 120 (SUTURE) IMPLANT
SUT VIC AB 4-0 SH 18 (SUTURE) ×2 IMPLANT
SUT VICRYL 0 TIES 12 18 (SUTURE) ×2 IMPLANT
SYR 10ML ECCENTRIC (SYRINGE) ×2 IMPLANT
SYR 20CC LL (SYRINGE) ×2 IMPLANT
SYR 50ML LL SCALE MARK (SYRINGE) ×2 IMPLANT
TOWEL OR 17X26 10 PK STRL BLUE (TOWEL DISPOSABLE) ×4 IMPLANT
TOWEL OR NON WOVEN STRL DISP B (DISPOSABLE) ×2 IMPLANT
TRAY FOLEY W/METER SILVER 16FR (SET/KITS/TRAYS/PACK) IMPLANT
TROCAR ADV FIXATION 5X100MM (TROCAR) ×2 IMPLANT
TROCAR BLADELESS 15MM (ENDOMECHANICALS) ×2 IMPLANT
TROCAR BLADELESS OPT 5 100 (ENDOMECHANICALS) ×2 IMPLANT
TUBE CALIBRATION LAPBAND (TUBING) IMPLANT
TUBING CONNECTING 10 (TUBING) ×2 IMPLANT
TUBING ENDO SMARTCAP (MISCELLANEOUS) ×2 IMPLANT
TUBING INSUF HEATED (TUBING) ×2 IMPLANT

## 2016-08-26 NOTE — H&P (Signed)
Teresa Golden 08/14/2016 12:18 PM Location: [facility] Patient #: [id number] DOB: 1945/09/07 Single / Language: [name] / Race: Black or African American Female   History of Present Illness Teresa Key B. Hassell Done MD; 08/14/2016 12:38 PM) The patient is a 71 year old female who presents for preop sleeve visit.   Preoperative visit for Teresa Golden who is having a sleeve gastrectomy on July 3. Since I saw her ;ast she has lost approximately 27 pounds and today she weighs 266 with a BMI of 45. She is one of Teresa Golden old patient's from [place] where he placed and subsequently  Removed her lapband. She had her cardiac clearance by Dr. Ellyn Hack at [facility]. He said that she had a good, strong heart without issues on a 2 day stress test. She is prepared for the sleeve gastrectomy. She may need a little more time in the hospital because of her breathing and a mobility issues. She may also require some home health since she lives alone.  Medication History  B Complex 1 (Oral) Active. Cholecalciferol (5000UNIT Tablet Disint, Oral) Active. Synthroid (25MCG Tablet, Oral) Active. Valsartan (160MG  Tablet, Oral) Active. Simvastatin (10MG  Tablet, Oral) Active. Carvedilol (3.125MG  Tablet, Oral) Active. Diclofenac Sodium (1% Gel, Transdermal) Active. Gabapentin (600MG  Tablet, Oral) Active. NovoLOG Mix 70/30 FlexPen ((70-30) 100UNIT/ML Susp Pen-inj, Subcutaneous) Active. NovoLIN N (100UNIT/ML Suspension, Subcutaneous) Active. Multivitamin Adult (Oral) Active. Carvedilol Phosphate ER (20MG  Capsule ER 24HR, Oral) Active. Gabapentin (300MG  Capsule, Oral) Active. Medications Reconciled  Vitals  08/14/2016 12:19 PM Weight: 266.8 lb Height: 64.5in Body Surface Area: 2.22 m Body Mass Index: 45.09 kg/m  Temp.: 98.71F  Pulse: 88 (Regular)  BP: 150/80 (Sitting, Left Arm, Standard)  Physical Exam  General:  Pleasant AAF NAD Note: HEENT exam  unremarkable. Neck supple Chest clear to auscultation Heart sinus rhythm without murmurs Abdomen prior lap band implant and explant and laparoscopic cholecystectomy. Patient's also had a hysterectomy.   Assessment & Plan  MORBID OBESITY (E66.01) Impression: BMI 45 with DM. sleeve gastrectomy on July 3  Matt B. Hassell Done, MD,FACS

## 2016-08-26 NOTE — Anesthesia Procedure Notes (Addendum)
Procedure Name: Intubation Date/Time: 08/26/2016 2:14 PM Performed by: West Pugh Pre-anesthesia Checklist: Patient identified, Emergency Drugs available, Suction available, Patient being monitored and Timeout performed Patient Re-evaluated:Patient Re-evaluated prior to inductionOxygen Delivery Method: Circle system utilized Preoxygenation: Pre-oxygenation with 100% oxygen Intubation Type: IV induction Ventilation: Mask ventilation without difficulty Laryngoscope Size: Mac and 4 Grade View: Grade II Tube type: Oral Tube size: 7.5 mm Number of attempts: 1 Airway Equipment and Method: Stylet Placement Confirmation: ETT inserted through vocal cords under direct vision,  positive ETCO2,  CO2 detector and breath sounds checked- equal and bilateral Secured at: 20 cm Tube secured with: Tape Dental Injury: Teeth and Oropharynx as per pre-operative assessment

## 2016-08-26 NOTE — Anesthesia Preprocedure Evaluation (Signed)
Anesthesia Evaluation  Patient identified by MRN, date of birth, ID band Patient awake    Reviewed: Allergy & Precautions, NPO status , Patient's Chart, lab work & pertinent test results  Airway Mallampati: I  TM Distance: >3 FB Neck ROM: Full    Dental   Pulmonary shortness of breath, former smoker,    Pulmonary exam normal        Cardiovascular hypertension, Pt. on medications Normal cardiovascular exam     Neuro/Psych    GI/Hepatic (+) Hepatitis -, C  Endo/Other  diabetes, Type 2, Insulin DependentMorbid obesity  Renal/GU      Musculoskeletal   Abdominal   Peds  Hematology   Anesthesia Other Findings   Reproductive/Obstetrics                             Anesthesia Physical Anesthesia Plan  ASA: III  Anesthesia Plan: General   Post-op Pain Management:    Induction: Intravenous  PONV Risk Score and Plan: 3 and Ondansetron, Dexamethasone, Propofol, Midazolam and Treatment may vary due to age or medical condition  Airway Management Planned:   Additional Equipment:   Intra-op Plan:   Post-operative Plan: Extubation in OR  Informed Consent: I have reviewed the patients History and Physical, chart, labs and discussed the procedure including the risks, benefits and alternatives for the proposed anesthesia with the patient or authorized representative who has indicated his/her understanding and acceptance.     Plan Discussed with: CRNA and Surgeon  Anesthesia Plan Comments:         Anesthesia Quick Evaluation

## 2016-08-26 NOTE — Interval H&P Note (Signed)
History and Physical Interval Note:  08/26/2016 1:24 PM  Teresa Golden  has presented today for surgery, with the diagnosis of MORBID OBESITY  The various methods of treatment have been discussed with the patient and family. After consideration of risks, benefits and other options for treatment, the patient has consented to  Procedure(s): LAPAROSCOPIC GASTRIC SLEEVE RESECTION WITH UPPER ENOD (N/A) as a surgical intervention .  The patient's history has been reviewed, patient examined, no change in status, stable for surgery.  I have reviewed the patient's chart and labs.  Questions were answered to the patient's satisfaction.     Jeilyn Reznik B

## 2016-08-26 NOTE — Anesthesia Postprocedure Evaluation (Signed)
Anesthesia Post Note  Patient: MELIZZA KANODE  Procedure(s) Performed: Procedure(s) (LRB): LAPAROSCOPIC GASTRIC SLEEVE RESECTION WITH UPPER ENOD (N/A)     Patient location during evaluation: PACU Anesthesia Type: General Level of consciousness: awake and alert Pain management: pain level controlled Vital Signs Assessment: post-procedure vital signs reviewed and stable Respiratory status: spontaneous breathing, nonlabored ventilation, respiratory function stable and patient connected to nasal cannula oxygen Cardiovascular status: blood pressure returned to baseline and stable Postop Assessment: no signs of nausea or vomiting Anesthetic complications: no    Last Vitals:  Vitals:   08/26/16 1915 08/26/16 2011  BP: (!) 180/62 (!) 182/83  Pulse: 68 80  Resp: 14 14  Temp: 37.2 C 36.5 C    Last Pain:  Vitals:   08/26/16 2011  TempSrc: Oral                 Guenther Dunshee DAVID

## 2016-08-26 NOTE — Op Note (Signed)
Surgeon: Kaylyn Lim, MD, FACS  Asst:  Gurney Maxin, MD  Anes:  General endotracheal  Procedure: Laparoscopic sleeve gastrectomy and upper endoscopy  Diagnosis: Morbid obesity  Complications: none  EBL:   25 cc  Description of Procedure:  The patient was take to OR 2 and given general anesthesia.  The abdomen was prepped with Technicare and draped sterilely.  A timeout was performed.  Access to the abdomen was achieved with a 5 mm Optiview through the left upper quadrant.  Following insufflation, the state of the abdomen was found to be adhesed in the midline.  These were taken down with the   The ViSiGi 36Fr tube was inserted to deflate the stomach and was pulled back into the esophagus.    The pylorus was identified and we measured 5 cm back and marked the antrum.  At that point we began dissection to take down the greater curvature of the stomach using the Harmonic scalpel.  Remnants of her prior lapband including prolene sutures were removed.  This dissection was taken all the way up to the left crus.  Posterior attachments of the stomach were also taken down.    The ViSiGi tube was then passed into the antrum and suction applied so that it was snug along the lessor curvature.  The "crow's foot" or incisura was identified.  The sleeve gastrectomy was begun using the [employer] stapler beginning with a 4.5 cm black with TRS.  This was followed by a 6 cm black, a 6 cm purple and completed with black loads.  When the sleeve was complete the tube was taken off suction and insufflated briefly.  The tube was withdrawn.  Upper endoscopy was then performed by Dr. Kieth Brightly.  The sleeve had good symmetry.   The specimen was extracted through the 15 trocar site which was closed with a endoclose and 0 vicry..  Wounds were infiltrated with Exparel  and closed with 4-0 monocryl.    Matt B. Hassell Done, [place], [facility], [place]

## 2016-08-26 NOTE — Transfer of Care (Signed)
Immediate Anesthesia Transfer of Care Note  Patient: Teresa Golden  Procedure(s) Performed: Procedure(s): LAPAROSCOPIC GASTRIC SLEEVE RESECTION WITH UPPER ENOD (N/A)  Patient Location: PACU  Anesthesia Type:General  Level of Consciousness:  sedated, patient cooperative and responds to stimulation  Airway & Oxygen Therapy:Patient Spontanous Breathing and Patient connected to face mask oxgen  Post-op Assessment:  Report given to PACU RN and Post -op Vital signs reviewed and stable  Post vital signs:  Reviewed and stable  Last Vitals:  Vitals:   08/26/16 1647 08/26/16 1653  BP: (!) 189/81   Pulse: 69 68  Resp: 17 20  Temp: 78.4 C     Complications: No apparent anesthesia complications

## 2016-08-26 NOTE — Op Note (Signed)
Preoperative diagnosis: laparoscopic sleeve gastrectomy  Postoperative diagnosis: Same   Procedure: Upper endoscopy   Surgeon: Lilli Dewald, M.D.  Anesthesia: Gen.   Indications for procedure: This patient was undergoing a laparoscopic sleeve gastrectomy.   Description of procedure: The endoscopy was placed in the mouth and into the oropharynx and under endoscopic vision it was advanced to the esophagogastric junction. The pouch was insufflated and no bleeding or bubbles were seen. The GEJ was identified at 42cm from the teeth. No bleeding or leaks were detected. The scope was withdrawn without difficulty.   Labrenda Lasky, M.D. General, Bariatric, & Minimally Invasive Surgery [facility]    

## 2016-08-27 LAB — CBC WITH DIFFERENTIAL/PLATELET
Basophils Absolute: 0 10*3/uL (ref 0.0–0.1)
Basophils Relative: 0 %
EOS PCT: 0 %
Eosinophils Absolute: 0 10*3/uL (ref 0.0–0.7)
HEMATOCRIT: 34.4 % — AB (ref 36.0–46.0)
Hemoglobin: 11.8 g/dL — ABNORMAL LOW (ref 12.0–15.0)
LYMPHS ABS: 1 10*3/uL (ref 0.7–4.0)
LYMPHS PCT: 10 %
MCH: 28.3 pg (ref 26.0–34.0)
MCHC: 34.3 g/dL (ref 30.0–36.0)
MCV: 82.5 fL (ref 78.0–100.0)
Monocytes Absolute: 0.8 10*3/uL (ref 0.1–1.0)
Monocytes Relative: 8 %
NEUTROS ABS: 8.3 10*3/uL — AB (ref 1.7–7.7)
Neutrophils Relative %: 82 %
PLATELETS: 177 10*3/uL (ref 150–400)
RBC: 4.17 MIL/uL (ref 3.87–5.11)
RDW: 14.4 % (ref 11.5–15.5)
WBC: 10 10*3/uL (ref 4.0–10.5)

## 2016-08-27 LAB — GLUCOSE, CAPILLARY
Glucose-Capillary: 242 mg/dL — ABNORMAL HIGH (ref 65–99)
Glucose-Capillary: 243 mg/dL — ABNORMAL HIGH (ref 65–99)
Glucose-Capillary: 249 mg/dL — ABNORMAL HIGH (ref 65–99)
Glucose-Capillary: 232 mg/dL — ABNORMAL HIGH (ref 65–99)
Glucose-Capillary: 234 mg/dL — ABNORMAL HIGH (ref 65–99)

## 2016-08-27 MED ORDER — INSULIN GLARGINE 100 UNIT/ML ~~LOC~~ SOLN
25.0000 [IU] | Freq: Every day | SUBCUTANEOUS | Status: DC
  Administered 2016-08-27: 25 [IU] via SUBCUTANEOUS
  Filled 2016-08-27 (×2): qty 0.25

## 2016-08-27 NOTE — Progress Notes (Signed)
CSW received consult to assist with Home Health,  please refer to floor RN case manager for services.   Kathrin Greathouse, Latanya Presser, MSW Clinical Social Worker 5E and Psychiatric Service Line [phone] 08/27/2016  8:03 AM

## 2016-08-27 NOTE — Progress Notes (Addendum)
Patient alert and oriented, Post op day 1.  Provided support and encouragement.  Encouraged pulmonary toilet, ambulation and small sips of liquids. Orders placed for Diabetic Coordinator to see patient. All questions answered.  Will continue to monitor.

## 2016-08-27 NOTE — Progress Notes (Signed)
PT Cancellation Note  Patient Details Name: Teresa Golden MRN: [id number] DOB: 22-Sep-1945   Cancelled Treatment:     PT order received but eval deferred.  RN advises, pt just back to bed after sitting up in chair and very tired.  Will follow in am.   Kyrese Gartman 08/27/2016, 2:11 PM

## 2016-08-27 NOTE — Progress Notes (Signed)
CSW following for discharge needs. PT pending.   Kathrin Greathouse, Latanya Presser, MSW Clinical Social Worker 5E and Psychiatric Service Line [phone] 08/27/2016  10:15 AM

## 2016-08-27 NOTE — Discharge Instructions (Signed)
° ° ° °GASTRIC BYPASS/SLEEVE ° Home Care Instructions ° ° These instructions are to help you care for yourself when you go home. ° °Call: If you have any problems. °• Call [phone] and ask for the surgeon on call °• If you need immediate assistance come to the ER at [facility]. Tell the ER staff you are a new post-op gastric bypass or gastric sleeve patient  °Signs and symptoms to report: • Severe  vomiting or nausea °o If you cannot handle clear liquids for longer than 1 day, call your surgeon °• Abdominal pain which does not get better after taking your pain medication °• Fever greater than 100.4°  F and chills °• Heart rate over 100 beats a minute °• Trouble breathing °• Chest pain °• Redness,  swelling, drainage, or foul odor at incision (surgical) sites °• If your incisions open or pull apart °• Swelling or pain in calf (lower leg) °• Diarrhea (Loose bowel movements that happen often), frequent watery, uncontrolled bowel movements °• Constipation, (no bowel movements for 3 days) if this happens: °o Take Milk of Magnesia, 2 tablespoons by mouth, 3 times a day for 2 days if needed °o Stop taking Milk of Magnesia once you have had a bowel movement °o Call your doctor if constipation continues °Or °o Take Miralax  (instead of Milk of Magnesia) following the label instructions °o Stop taking Miralax once you have had a bowel movement °o Call your doctor if constipation continues °• Anything you think is “abnormal for you” °  °Normal side effects after surgery: • Unable to sleep at night or unable to concentrate °• Irritability °• Being tearful (crying) or depressed ° °These are common complaints, possibly related to your anesthesia, stress of surgery, and change in lifestyle, that usually go away a few weeks after surgery. If these feelings continue, call your medical doctor.  °Wound Care: You may have surgical glue, steri-strips, or staples over your incisions after surgery °• Surgical glue: Looks like clear  film over your incisions and will wear off a little at a time °• Steri-strips: Adhesive strips of tape over your incisions. You may notice a yellowish color on skin under the steri-strips. This is used to make the steri-strips stick better. Do not pull the steri-strips off - let them fall off °• Staples: Staples may be removed before you leave the hospital °o If you go home with staples, call [facility] for an appointment with your surgeon’s nurse to have staples removed 10 days after surgery, [phone] °• Showering: You may shower two (2) days after your surgery unless your surgeon tells you differently °o Wash gently around incisions with warm soapy water, rinse well, and gently pat dry °o If you have a drain (tube from your incision), you may need someone to hold this while you shower °o No tub baths until staples are removed and incisions are healed °  °Medications: • Medications should be liquid or crushed if larger than the size of a dime °• Extended release pills (medication that releases a little bit at a time through the  day) should not be crushed °• Depending on the size and number of medications you take, you may need to space (take a few throughout the day)/change the time you take your medications so that you do not over-fill your pouch (smaller stomach) °• Make sure you follow-up with you primary care physician to make medication changes needed during rapid weight loss and life -style changes °•   If you have diabetes, follow up with your doctor that orders your diabetes medication(s) within one week after surgery and check your blood sugar regularly ° °• Do not drive while taking narcotics (pain medications) ° °• Do not take acetaminophen (Tylenol) and Roxicet or Lortab Elixir at the same time since these pain medications contain acetaminophen °  °Diet:  °First 2 Weeks You will see the nutritionist about two (2) weeks after your surgery. The nutritionist will increase the types of  foods you can eat if you are handling liquids well: °• If you have severe vomiting or nausea and cannot handle clear liquids lasting longer than 1 day call your surgeon °Protein Shake °• Drink at least 2 ounces of shake 5-6 times per day °• Each serving of protein shakes (usually 8-12 ounces) should have a minimum of: °o 15 grams of protein °o And no more than 5 grams of carbohydrate °• Goal for protein each day: °o Men = 80 grams per day °o Women = 60 grams per day °  ° • Protein powder may be added to fluids such as non-fat milk or Lactaid milk or Soy milk (limit to 35 grams added protein powder per serving) ° °Hydration °• Slowly increase the amount of water and other clear liquids as tolerated (See Acceptable Fluids) °• Slowly increase the amount of protein shake as tolerated °• Sip fluids slowly and throughout the day °• May use sugar substitutes in small amounts (no more than 6-8 packets per day; i.e. Splenda) ° °Fluid Goal °• The first goal is to drink at least 8 ounces of protein shake/drink per day (or as directed by the nutritionist); some examples of protein shakes are Syntrax Nectar, Adkins Advantage, EAS Edge HP, and Unjury. - See handout from pre-op Bariatric Education Class: °o Slowly increase the amount of protein shake you drink as tolerated °o You may find it easier to slowly sip shakes throughout the day °o It is important to get your proteins in first °• Your fluid goal is to drink 64-100 ounces of fluid daily °o It may take a few weeks to build up to this  °• 32 oz. (or more) should be clear liquids °And °• 32 oz. (or more) should be full liquids (see below for examples) °• Liquids should not contain sugar, caffeine, or carbonation ° °Clear Liquids: °• Water of Sugar-free flavored water (i.e. Fruit H²O, Propel) °• Decaffeinated coffee or tea (sugar-free) °• Crystal lite, Wyler’s Lite, Minute Maid Lite °• Sugar-free Jell-O °• Bouillon or broth °• Sugar-free Popsicle:    - Less than 20 calories  each; Limit 1 per day ° °Full Liquids: °                  Protein Shakes/Drinks + 2 choices per day of other full liquids °• Full liquids must be: °o No More Than 12 grams of Carbs per serving °o No More Than 3 grams of Fat per serving °• Strained low-fat cream soup °• Non-Fat milk °• Fat-free Lactaid Milk °• Sugar-free yogurt (Dannon Lite & Fit, Greek yogurt) ° °  °Vitamins and Minerals • Start 1 day after surgery unless otherwise directed by your surgeon °• 2 Chewable Bariatric Multivitamin / Multimineral Supplement with iron °• Chewable Calcium Citrate with Vitamin D-3 °(Example: 3 Chewable Calcium  Plus 600 with Vitamin D-3) °o Take 500 mg three (3) times a day for a total of 1500 mg each day °o Do not take all 3 doses of calcium   at one time as it may cause constipation, and you can only absorb 500 mg at a time °o Do not mix multivitamins containing iron with calcium supplements;  take 2 hours apart °• Menstruating women and those at risk for anemia ( a blood disease that causes weakness) may need extra iron °o Talk to your doctor to see if you need more iron °• If you need extra iron: Total daily Iron recommendation (including Vitamins) is 50 to 100 mg Iron/day °• Do not stop taking or change any vitamins or minerals until you talk to your nutritionist or surgeon °• Your nutritionist and/or surgeon must approve all vitamin and mineral supplements °  °Activity and Exercise: It is important to continue walking at home. Limit your physical activity as instructed by your doctor. During this time, use these guidelines: °• Do not lift anything greater than ten  (10) pounds for at least two (2) weeks °• Do not go back to work or drive until your surgeon says you can °• You may have sex when you feel comfortable °o It is VERY important for female patients to use a reliable birth control method; fertility often increase after surgery °o Do not get pregnant for at least 18 months °• Start exercising as soon as your  doctor tells you that you can °o Make sure your doctor approves any physical activity °• Start with a simple walking program °• Walk 5-15 minutes each day, 7 days per week °• Slowly increase until you are walking 30-45 minutes per day °• Consider joining our BELT program. [phone] or email [email or web address] °  °Special Instructions Things to remember: °• Use your CPAP when sleeping if this applies to you °• Consider buying a medical alert bracelet that says you had lap-band surgery °  °  You will likely have your first fill (fluid added to your band) 6 - 8 weeks after surgery °• [facility] has a free Bariatric Surgery Support Group that meets monthly, the 3rd Thursday, 6pm. [facility] Classrooms. You can see classes online at [email or web address] °• It is very important to keep all follow up appointments with your surgeon, nutritionist, primary care physician, and behavioral health practitioner °o After the first year, please follow up with your bariatric surgeon and nutritionist at least once a year in order to maintain best weight loss results °      °             [facility]:  [phone] ° °             [facility]: [phone] ° °             Bariatric Nurse Coordinator: [phone]  °Gastric Bypass/Sleeve Home Care Instructions  Rev. 03/2012    ° °                                                    Reviewed and Endorsed °                                                   by [facility] Patient Education Committee, Jan, 2014 ° ° ° ° ° ° ° ° ° °

## 2016-08-27 NOTE — Progress Notes (Signed)
Assessment Active Problems:   Morbid obesity s/p laparoscopic sleeve gastrectomy August 26, 2016-stable overnight   Type 1 DM-poorly controlled   Plan:  Advance to bariatric full liquid diet. Add small dose of Lantus insulin   LOS: 1 day     1 Day Post-Op  Chief Complaint/Subjective: Abdominal soreness. Has walked.  Objective: Vital signs in last 24 hours: Temp:  [97.4 F (36.3 C)-99.4 F (37.4 C)] 98.4 F (36.9 C) (07/04 0600) Pulse Rate:  [64-88] 88 (07/04 0600) Resp:  [12-26] 16 (07/04 0600) BP: (178-210)/(62-109) 184/75 (07/04 0600) SpO2:  [94 %-100 %] 99 % (07/04 0600) Weight:  [128.1 kg (282 lb 6.4 oz)] 128.1 kg (282 lb 6.4 oz) (07/03 1138)    Intake/Output from previous day: 07/03 0701 - 07/04 0700 In: 2568.3 [P.O.:300; I.V.:2268.3] Out: 1875 [Urine:1850; Blood:25] Intake/Output this shift: No intake/output data recorded.  PE: General- In NAD.  Awake and alert. Abdomen-soft, obese, incisions are clean and intact  Lab Results:   Recent Labs  08/26/16 1144 08/26/16 2051 08/27/16 0505  WBC 7.7  --  10.0  HGB 12.1 12.7 11.8*  HCT 35.6* 36.3 34.4*  PLT 187  --  177   BMET  Recent Labs  08/26/16 1144  NA 139  K 4.1  CL 107  CO2 23  GLUCOSE 110*  BUN 21*  CREATININE 0.91  CALCIUM 9.3   PT/INR No results for input(s): LABPROT, INR in the last 72 hours. Comprehensive Metabolic Panel:    Component Value Date/Time   NA 139 08/26/2016 1144   NA 142 08/21/2016 1037   K 4.1 08/26/2016 1144   K 4.2 08/21/2016 1037   CL 107 08/26/2016 1144   CL 109 08/21/2016 1037   CO2 23 08/26/2016 1144   CO2 26 08/21/2016 1037   BUN 21 (H) 08/26/2016 1144   BUN 21 (H) 08/21/2016 1037   CREATININE 0.91 08/26/2016 1144   CREATININE 1.04 (H) 08/21/2016 1037   GLUCOSE 110 (H) 08/26/2016 1144   GLUCOSE 76 08/21/2016 1037   CALCIUM 9.3 08/26/2016 1144   CALCIUM 9.2 08/21/2016 1037   AST 37 08/26/2016 1144   AST 30 06/09/2015 1725   ALT 23 08/26/2016 1144   ALT  21 06/09/2015 1725   ALKPHOS 55 08/26/2016 1144   ALKPHOS 56 06/09/2015 1725   BILITOT 0.9 08/26/2016 1144   BILITOT 0.7 06/09/2015 1725   PROT 7.4 08/26/2016 1144   PROT 7.5 06/09/2015 1725   ALBUMIN 3.8 08/26/2016 1144   ALBUMIN 3.7 06/09/2015 1725     Studies/Results: No results found.  Anti-infectives: Anti-infectives    Start     Dose/Rate Route Frequency Ordered Stop   08/26/16 1130  cefoTEtan in Dextrose 5% (CEFOTAN) IVPB 2 g     2 g Intravenous On call to O.R. 08/26/16 1120 08/26/16 1448       Tasheema Perrone J 08/27/2016

## 2016-08-27 NOTE — Progress Notes (Signed)
Patient requesting [facility] services. Spoke with patient at bedside. Patient states she lives alone, resides at independent living facility, they do not provide any services. Discussed with patient services that are covered by insurance and services that are private pay. Patient states if she is unable to have help at home she would likely need SNF. Will await PT evaluation, CSW consulted.

## 2016-08-28 LAB — CBC WITH DIFFERENTIAL/PLATELET
BASOS ABS: 0 10*3/uL (ref 0.0–0.1)
BASOS PCT: 0 %
EOS ABS: 0.2 10*3/uL (ref 0.0–0.7)
Eosinophils Relative: 1 %
HEMATOCRIT: 34.1 % — AB (ref 36.0–46.0)
HEMOGLOBIN: 11.5 g/dL — AB (ref 12.0–15.0)
Lymphocytes Relative: 15 %
Lymphs Abs: 1.9 10*3/uL (ref 0.7–4.0)
MCH: 28 pg (ref 26.0–34.0)
MCHC: 33.7 g/dL (ref 30.0–36.0)
MCV: 83.2 fL (ref 78.0–100.0)
MONO ABS: 1.2 10*3/uL — AB (ref 0.1–1.0)
MONOS PCT: 10 %
NEUTROS ABS: 9.1 10*3/uL — AB (ref 1.7–7.7)
NEUTROS PCT: 74 %
Platelets: 180 10*3/uL (ref 150–400)
RBC: 4.1 MIL/uL (ref 3.87–5.11)
RDW: 14.3 % (ref 11.5–15.5)
WBC: 12.3 10*3/uL — ABNORMAL HIGH (ref 4.0–10.5)

## 2016-08-28 LAB — GLUCOSE, CAPILLARY
GLUCOSE-CAPILLARY: 165 mg/dL — AB (ref 65–99)
GLUCOSE-CAPILLARY: 210 mg/dL — AB (ref 65–99)
GLUCOSE-CAPILLARY: 241 mg/dL — AB (ref 65–99)
GLUCOSE-CAPILLARY: 281 mg/dL — AB (ref 65–99)
Glucose-Capillary: 204 mg/dL — ABNORMAL HIGH (ref 65–99)
Glucose-Capillary: 180 mg/dL — ABNORMAL HIGH (ref 65–99)

## 2016-08-28 MED ORDER — IRBESARTAN 150 MG PO TABS
150.0000 mg | ORAL_TABLET | Freq: Every day | ORAL | Status: DC
  Administered 2016-08-28 – 2016-08-30 (×3): 150 mg via ORAL
  Filled 2016-08-28 (×3): qty 1

## 2016-08-28 MED ORDER — LEVOTHYROXINE SODIUM 25 MCG PO TABS: 12.5000 ug | ORAL_TABLET | ORAL | Status: DC

## 2016-08-28 MED ORDER — INSULIN GLARGINE 100 UNIT/ML ~~LOC~~ SOLN
30.0000 [IU] | Freq: Every day | SUBCUTANEOUS | Status: DC
  Administered 2016-08-28 – 2016-08-30 (×3): 30 [IU] via SUBCUTANEOUS
  Filled 2016-08-28 (×3): qty 0.3

## 2016-08-28 MED ORDER — LEVOTHYROXINE SODIUM 25 MCG PO TABS
25.0000 ug | ORAL_TABLET | ORAL | Status: DC
  Administered 2016-08-30: 25 ug via ORAL
  Filled 2016-08-28: qty 1

## 2016-08-28 MED ORDER — GABAPENTIN 300 MG PO CAPS
300.0000 mg | ORAL_CAPSULE | Freq: Two times a day (BID) | ORAL | Status: DC
  Administered 2016-08-28 – 2016-08-30 (×5): 300 mg via ORAL
  Filled 2016-08-28 (×5): qty 1

## 2016-08-28 MED ORDER — PANTOPRAZOLE SODIUM 40 MG PO TBEC
40.0000 mg | DELAYED_RELEASE_TABLET | Freq: Every day | ORAL | Status: DC
  Administered 2016-08-28 – 2016-08-29 (×2): 40 mg via ORAL
  Filled 2016-08-28 (×2): qty 1

## 2016-08-28 MED ORDER — LEVOTHYROXINE SODIUM 25 MCG PO TABS
12.5000 ug | ORAL_TABLET | ORAL | Status: DC
  Administered 2016-08-29: 12.5 ug via ORAL
  Filled 2016-08-28: qty 0.5

## 2016-08-28 NOTE — Progress Notes (Signed)
Nutrition Brief Note  RD consulted via DROP protocol.   RD attempted to review diet with patient but patient continually fell asleep during education. Will attempt at a later time.   Clayton Bibles, MS, RD, LDN Pager: [phone] After Hours Pager: [phone]

## 2016-08-28 NOTE — Progress Notes (Signed)
[name], female   DOB: 04-08-1945, 70 y.o.   MRN: [id number] [facility] Progress Note:   2 Days Post-Op  Subjective: Mental status is clear.  She is up for mobility assessment per PT Objective: Vital signs in last 24 hours: Temp:  [98.4 F (36.9 C)-99.3 F (37.4 C)] 99.3 F (37.4 C) (07/05 0522) Pulse Rate:  [68-78] 69 (07/05 0522) Resp:  [18] 18 (07/05 0522) BP: (150-183)/(63-88) 182/64 (07/05 0522) SpO2:  [100 %] 100 % (07/05 0522) Weight:  [130.6 kg (287 lb 14.7 oz)] 130.6 kg (287 lb 14.7 oz) (07/04 2204)  Intake/Output from previous day: 07/04 0701 - 07/05 0700 In: 1160 [P.O.:360; I.V.:800] Out: 1500 [Urine:1500] Intake/Output this shift: No intake/output data recorded.  Physical Exam: Work of breathing is normal.  Taking bariatric liquids.    Lab Results:  Results for orders placed or performed during the hospital encounter of 08/26/16 (from the past 48 hour(s))  Glucose, capillary     Status: Abnormal   Collection Time: 08/26/16 11:37 AM  Result Value Ref Range   Glucose-Capillary 114 (H) 65 - 99 mg/dL   Comment 1 Notify RN   CBC WITH DIFFERENTIAL     Status: Abnormal   Collection Time: 08/26/16 11:44 AM  Result Value Ref Range   WBC 7.7 4.0 - 10.5 K/uL   RBC 4.36 3.87 - 5.11 MIL/uL   Hemoglobin 12.1 12.0 - 15.0 g/dL   HCT 35.6 (L) 36.0 - 46.0 %   MCV 81.7 78.0 - 100.0 fL   MCH 27.8 26.0 - 34.0 pg   MCHC 34.0 30.0 - 36.0 g/dL   RDW 14.3 11.5 - 15.5 %   Platelets 187 150 - 400 K/uL   Neutrophils Relative % 56 %   Neutro Abs 4.4 1.7 - 7.7 K/uL   Lymphocytes Relative 32 %   Lymphs Abs 2.5 0.7 - 4.0 K/uL   Monocytes Relative 8 %   Monocytes Absolute 0.6 0.1 - 1.0 K/uL   Eosinophils Relative 3 %   Eosinophils Absolute 0.2 0.0 - 0.7 K/uL   Basophils Relative 1 %   Basophils Absolute 0.0 0.0 - 0.1 K/uL  Comprehensive metabolic panel     Status: Abnormal   Collection Time: 08/26/16 11:44 AM  Result Value Ref Range   Sodium 139 135 -  145 mmol/L   Potassium 4.1 3.5 - 5.1 mmol/L   Chloride 107 101 - 111 mmol/L   CO2 23 22 - 32 mmol/L   Glucose, Bld 110 (H) 65 - 99 mg/dL   BUN 21 (H) 6 - 20 mg/dL   Creatinine, Ser 0.91 0.44 - 1.00 mg/dL   Calcium 9.3 8.9 - 10.3 mg/dL   Total Protein 7.4 6.5 - 8.1 g/dL   Albumin 3.8 3.5 - 5.0 g/dL   AST 37 15 - 41 U/L   ALT 23 14 - 54 U/L   Alkaline Phosphatase 55 38 - 126 U/L   Total Bilirubin 0.9 0.3 - 1.2 mg/dL   GFR calc non Af Amer >60 >60 mL/min   GFR calc Af Amer >60 >60 mL/min    Comment: (NOTE) The eGFR has been calculated using the CKD EPI equation. This calculation has not been validated in all clinical situations. eGFR's persistently <60 mL/min signify possible Chronic Kidney Disease.    Anion gap 9 5 - 15  Glucose, capillary     Status: Abnormal   Collection Time: 08/26/16  3:17 PM  Result Value Ref Range  Glucose-Capillary 120 (H) 65 - 99 mg/dL  Glucose, capillary     Status: Abnormal   Collection Time: 08/26/16  4:53 PM  Result Value Ref Range   Glucose-Capillary 157 (H) 65 - 99 mg/dL  Glucose, capillary     Status: Abnormal   Collection Time: 08/26/16  8:04 PM  Result Value Ref Range   Glucose-Capillary 199 (H) 65 - 99 mg/dL  Hemoglobin and hematocrit, blood     Status: None   Collection Time: 08/26/16  8:51 PM  Result Value Ref Range   Hemoglobin 12.7 12.0 - 15.0 g/dL   HCT 36.3 36.0 - 46.0 %  Glucose, capillary     Status: Abnormal   Collection Time: 08/26/16 11:56 PM  Result Value Ref Range   Glucose-Capillary 215 (H) 65 - 99 mg/dL  Glucose, capillary     Status: Abnormal   Collection Time: 08/27/16  3:26 AM  Result Value Ref Range   Glucose-Capillary 242 (H) 65 - 99 mg/dL  CBC WITH DIFFERENTIAL     Status: Abnormal   Collection Time: 08/27/16  5:05 AM  Result Value Ref Range   WBC 10.0 4.0 - 10.5 K/uL   RBC 4.17 3.87 - 5.11 MIL/uL   Hemoglobin 11.8 (L) 12.0 - 15.0 g/dL   HCT 34.4 (L) 36.0 - 46.0 %   MCV 82.5 78.0 - 100.0 fL   MCH 28.3 26.0  - 34.0 pg   MCHC 34.3 30.0 - 36.0 g/dL   RDW 14.4 11.5 - 15.5 %   Platelets 177 150 - 400 K/uL   Neutrophils Relative % 82 %   Neutro Abs 8.3 (H) 1.7 - 7.7 K/uL   Lymphocytes Relative 10 %   Lymphs Abs 1.0 0.7 - 4.0 K/uL   Monocytes Relative 8 %   Monocytes Absolute 0.8 0.1 - 1.0 K/uL   Eosinophils Relative 0 %   Eosinophils Absolute 0.0 0.0 - 0.7 K/uL   Basophils Relative 0 %   Basophils Absolute 0.0 0.0 - 0.1 K/uL  Glucose, capillary     Status: Abnormal   Collection Time: 08/27/16  8:03 AM  Result Value Ref Range   Glucose-Capillary 243 (H) 65 - 99 mg/dL  Glucose, capillary     Status: Abnormal   Collection Time: 08/27/16 12:18 PM  Result Value Ref Range   Glucose-Capillary 249 (H) 65 - 99 mg/dL  Glucose, capillary     Status: Abnormal   Collection Time: 08/27/16  4:21 PM  Result Value Ref Range   Glucose-Capillary 232 (H) 65 - 99 mg/dL  Glucose, capillary     Status: Abnormal   Collection Time: 08/27/16  8:04 PM  Result Value Ref Range   Glucose-Capillary 234 (H) 65 - 99 mg/dL  Glucose, capillary     Status: Abnormal   Collection Time: 08/28/16 12:06 AM  Result Value Ref Range   Glucose-Capillary 281 (H) 65 - 99 mg/dL  Glucose, capillary     Status: Abnormal   Collection Time: 08/28/16  4:11 AM  Result Value Ref Range   Glucose-Capillary 241 (H) 65 - 99 mg/dL  CBC with Differential     Status: Abnormal   Collection Time: 08/28/16  4:36 AM  Result Value Ref Range   WBC 12.3 (H) 4.0 - 10.5 K/uL   RBC 4.10 3.87 - 5.11 MIL/uL   Hemoglobin 11.5 (L) 12.0 - 15.0 g/dL   HCT 34.1 (L) 36.0 - 46.0 %   MCV 83.2 78.0 - 100.0 fL   MCH 28.0 26.0 -  34.0 pg   MCHC 33.7 30.0 - 36.0 g/dL   RDW 14.3 11.5 - 15.5 %   Platelets 180 150 - 400 K/uL   Neutrophils Relative % 74 %   Neutro Abs 9.1 (H) 1.7 - 7.7 K/uL   Lymphocytes Relative 15 %   Lymphs Abs 1.9 0.7 - 4.0 K/uL   Monocytes Relative 10 %   Monocytes Absolute 1.2 (H) 0.1 - 1.0 K/uL   Eosinophils Relative 1 %   Eosinophils  Absolute 0.2 0.0 - 0.7 K/uL   Basophils Relative 0 %   Basophils Absolute 0.0 0.0 - 0.1 K/uL  Glucose, capillary     Status: Abnormal   Collection Time: 08/28/16  7:36 AM  Result Value Ref Range   Glucose-Capillary 210 (H) 65 - 99 mg/dL    Radiology/Results: No results found.  Anti-infectives: Anti-infectives    Start     Dose/Rate Route Frequency Ordered Stop   08/26/16 1130  cefoTEtan in Dextrose 5% (CEFOTAN) IVPB 2 g     2 g Intravenous On call to O.R. 08/26/16 1120 08/26/16 1448      Assessment/Plan: Problem List: Patient Active Problem List   Diagnosis Date Noted  . S/P laparoscopic sleeve gastrectomy July 2018 08/26/2016  . Controlled type 1 diabetes mellitus with complication, with long-term current use of insulin ([place])   . Preop cardiovascular exam 07/30/2016  . Dyspnea on exertion 07/30/2016  . Morbid obesity ([place]) 07/30/2016  . Bilateral lower extremity edema 07/30/2016    Restart Valsartan and assess BP.  Arrangements for discharge hopefully tomorrow in progress.  2 Days Post-Op    LOS: 2 days   Matt B. Hassell Done, MD, [facility] [phone] beeper [phone]  08/28/2016 9:02 AM

## 2016-08-28 NOTE — Progress Notes (Signed)
PHARMACIST - PHYSICIAN COMMUNICATION CONCERNING: IV to Oral Route Change Policy  RECOMMENDATION: This patient is receiving Protonix by the intravenous route.  Based on criteria approved by the Pharmacy and Therapeutics Committee, the intravenous medication(s) is/are being converted to the equivalent oral dose form(s).   DESCRIPTION: These criteria include:  The patient is eating (either orally or via tube) and/or has been taking other orally administered medications for a least 24 hours  The patient has no evidence of active gastrointestinal bleeding or impaired GI absorption (gastrectomy, short bowel, patient on TNA or NPO).  If you have questions about this conversion, please contact the Pharmacy Department  []   [phone] )  [facility] []   [phone] )  [facility] []   [phone] )  [facility] []   [phone] )  [facility] [x]   [phone] )  [place], [facility] 08/28/2016 10:18 AM

## 2016-08-28 NOTE — Progress Notes (Addendum)
Discharge planning, spoke with patient at beside. Patient does not have a preference for [facility] services, contacted [facility] for referral. They will accept her. She has DME already in place. [phone]

## 2016-08-28 NOTE — Progress Notes (Addendum)
Patient alert and oriented, pain is controlled. Patient is tolerating fluids, advanced to protein shake today. Reviewed Gastric Bypass discharge instructions with patient and patient is able to articulate understanding. Provided information on BELT program, Support Group and [facility] outpatient pharmacy. All questions answered, will continue to monitor.

## 2016-08-28 NOTE — Evaluation (Signed)
Physical Therapy Evaluation Patient Details Name: Teresa Golden MRN: [id number] DOB: 02/02/1946 Today's Date: 08/28/2016   History of Present Illness  Pt s/p laparoscopic sleeve gastrectomy and with hx of DM and L TKR  Clinical Impression  Pt admitted as above and presenting with functional mobility limitations 2* limited endurance, generalized weakness and mild ambulatory balance deficits.  Pt should progress to dc to previous IND living apartment and should be mobilizing at MOD I level at that time but could benefit from [facility] aide to assist initially with more difficult ADL.    Follow Up Recommendations No PT follow up    Equipment Recommendations  None recommended by PT    Recommendations for Other Services OT consult     Precautions / Restrictions Precautions Precautions: Fall Restrictions Weight Bearing Restrictions: No      Mobility  Bed Mobility Overal bed mobility: Modified Independent             General bed mobility comments: Pt unassisted supine to sit and able to move laterally in bed with VC only  Transfers Overall transfer level: Needs assistance Equipment used: Rolling walker (2 wheeled) Transfers: Sit to/from Stand Sit to Stand: Min guard         General transfer comment: cues for use of UEs to self assist;  Pt from recliner, to/from [facility] and to bedside  Ambulation/Gait Ambulation/Gait assistance: Min guard Ambulation Distance (Feet): 280 Feet Assistive device: Rolling walker (2 wheeled) Gait Pattern/deviations: Step-through pattern;Decreased step length - right;Decreased step length - left;Shuffle;Trunk flexed Gait velocity: decr Gait velocity interpretation: Below normal speed for age/gender General Gait Details: cues for posture and position from RW.  Multiple standing rest breaks to complete task  Stairs            Wheelchair Mobility    Modified Rankin (Stroke Patients Only)       Balance Overall balance assessment: Needs  assistance Sitting-balance support: No upper extremity supported;Feet supported Sitting balance-Leahy Scale: Good     Standing balance support: Bilateral upper extremity supported Standing balance-Leahy Scale: Poor                               Pertinent Vitals/Pain Pain Assessment: Faces Faces Pain Scale: Hurts a little bit Pain Location: abdomen Pain Descriptors / Indicators: Sore Pain Intervention(s): Limited activity within patient's tolerance;Monitored during session    Home Living Family/patient expects to be discharged to:: Private residence Living Arrangements: Alone Available Help at Discharge: Friend(s);Available PRN/intermittently Type of Home: Apartment Home Access: Elevator;Level entry     Home Layout: One level Home Equipment: Walker - 4 wheels;Cane - single point      Prior Function Level of Independence: Independent;Independent with assistive device(s)               Hand Dominance        Extremity/Trunk Assessment   Upper Extremity Assessment Upper Extremity Assessment: Overall WFL for tasks assessed    Lower Extremity Assessment Lower Extremity Assessment: Overall WFL for tasks assessed       Communication   Communication: No difficulties  Cognition Arousal/Alertness: Awake/alert Behavior During Therapy: WFL for tasks assessed/performed Overall Cognitive Status: Within Functional Limits for tasks assessed                                        General Comments  Exercises General Exercises - Lower Extremity Ankle Circles/Pumps: AROM;Both;20 reps;Supine   Assessment/Plan    PT Assessment Patient needs continued PT services  PT Problem List Decreased strength;Decreased activity tolerance;Decreased balance;Decreased mobility;Decreased knowledge of use of DME;Pain       PT Treatment Interventions DME instruction;Gait training;Functional mobility training;Therapeutic activities;Therapeutic  exercise;Patient/family education    PT Goals (Current goals can be found in the Care Plan section)  Acute Rehab PT Goals Patient Stated Goal: Regain IND and return home PT Goal Formulation: With patient Time For Goal Achievement: 09/03/16 Potential to Achieve Goals: Good    Frequency Min 3X/week   Barriers to discharge Decreased caregiver support Pt states has friends who can assist wtih small tasks like shopping and appts    Co-evaluation               AM-PAC PT "6 Clicks" Daily Activity  Outcome Measure Difficulty turning over in bed (including adjusting bedclothes, sheets and blankets)?: A Little Difficulty moving from lying on back to sitting on the side of the bed? : A Little Difficulty sitting down on and standing up from a chair with arms (e.g., wheelchair, bedside commode, etc,.)?: A Little Help needed moving to and from a bed to chair (including a wheelchair)?: A Little Help needed walking in hospital room?: A Little Help needed climbing 3-5 steps with a railing? : A Little 6 Click Score: 18    End of Session   Activity Tolerance: Patient tolerated treatment well;Patient limited by fatigue Patient left: in bed;with call bell/phone within reach Nurse Communication: Mobility status PT Visit Diagnosis: Unsteadiness on feet (R26.81)    Time: 0822-0900 PT Time Calculation (min) (ACUTE ONLY): 38 min   Charges:   PT Evaluation $PT Eval Low Complexity: 1 Procedure PT Treatments $Gait Training: 8-22 mins $Therapeutic Activity: 8-22 mins   PT G Codes:        Pg [phone]   Dominque Marlin 08/28/2016, 1:04 PM

## 2016-08-28 NOTE — Progress Notes (Signed)
Patient alert and oriented, Post op day 2.  Provided support and encouragement.  Encouraged pulmonary toilet, ambulation and small sips of liquids.  All questions answered.  Will continue to monitor. 

## 2016-08-28 NOTE — Progress Notes (Signed)
Results for RYLINN, LINZY (MRN [id number]) as of 08/28/2016 08:37  Ref. Range 08/27/2016 16:21 08/27/2016 20:04 08/28/2016 00:06 08/28/2016 04:11 08/28/2016 07:36  Glucose-Capillary Latest Ref Range: 65 - 99 mg/dL 232 (H) 234 (H) 281 (H) 241 (H) 210 (H)  Noted that blood sugars continue to be greater than 180 mg/dl. Recommend increasing Lantus to 30 units every HS. Will continue to monitor blood sugars while in the hospital.  Harvel Ricks RN BSN CDE Diabetes Coordinator Pager: [phone]  8am-5pm

## 2016-08-29 LAB — GLUCOSE, CAPILLARY
GLUCOSE-CAPILLARY: 156 mg/dL — AB (ref 65–99)
GLUCOSE-CAPILLARY: 168 mg/dL — AB (ref 65–99)
Glucose-Capillary: 165 mg/dL — ABNORMAL HIGH (ref 65–99)
Glucose-Capillary: 170 mg/dL — ABNORMAL HIGH (ref 65–99)
Glucose-Capillary: 186 mg/dL — ABNORMAL HIGH (ref 65–99)
Glucose-Capillary: 183 mg/dL — ABNORMAL HIGH (ref 65–99)

## 2016-08-29 NOTE — Progress Notes (Signed)
[name], female   DOB: 1945-05-17, 71 y.o.   MRN: [id number] [facility] Progress Note:   3 Days Post-Op  Subjective: Mental status is clear.  Feeling stronger Objective: Vital signs in last 24 hours: Temp:  [98.3 F (36.8 C)-99.2 F (37.3 C)] 98.6 F (37 C) (07/06 0948) Pulse Rate:  [65-71] 66 (07/06 0948) Resp:  [17-18] 18 (07/06 0948) BP: (128-164)/(48-70) 161/70 (07/06 0948) SpO2:  [95 %-98 %] 97 % (07/06 0948) Weight:  [125.5 kg (276 lb 9.6 oz)] 125.5 kg (276 lb 9.6 oz) (07/06 0434)  Intake/Output from previous day: 07/05 0701 - 07/06 0700 In: 2330 [P.O.:380; I.V.:1950] Out: 1300 [Urine:1300] Intake/Output this shift: Total I/O In: 120 [P.O.:120] Out: 300 [Urine:300]  Physical Exam: Work of breathing is normal.  Minimal pain from incisions.    Lab Results:  Results for orders placed or performed during the hospital encounter of 08/26/16 (from the past 48 hour(s))  Glucose, capillary     Status: Abnormal   Collection Time: 08/27/16 12:18 PM  Result Value Ref Range   Glucose-Capillary 249 (H) 65 - 99 mg/dL  Glucose, capillary     Status: Abnormal   Collection Time: 08/27/16  4:21 PM  Result Value Ref Range   Glucose-Capillary 232 (H) 65 - 99 mg/dL  Glucose, capillary     Status: Abnormal   Collection Time: 08/27/16  8:04 PM  Result Value Ref Range   Glucose-Capillary 234 (H) 65 - 99 mg/dL  Glucose, capillary     Status: Abnormal   Collection Time: 08/28/16 12:06 AM  Result Value Ref Range   Glucose-Capillary 281 (H) 65 - 99 mg/dL  Glucose, capillary     Status: Abnormal   Collection Time: 08/28/16  4:11 AM  Result Value Ref Range   Glucose-Capillary 241 (H) 65 - 99 mg/dL  CBC with Differential     Status: Abnormal   Collection Time: 08/28/16  4:36 AM  Result Value Ref Range   WBC 12.3 (H) 4.0 - 10.5 K/uL   RBC 4.10 3.87 - 5.11 MIL/uL   Hemoglobin 11.5 (L) 12.0 - 15.0 g/dL   HCT 34.1 (L) 36.0 - 46.0 %   MCV 83.2 78.0 - 100.0 fL   MCH 28.0 26.0 - 34.0 pg   MCHC 33.7 30.0 - 36.0 g/dL   RDW 14.3 11.5 - 15.5 %   Platelets 180 150 - 400 K/uL   Neutrophils Relative % 74 %   Neutro Abs 9.1 (H) 1.7 - 7.7 K/uL   Lymphocytes Relative 15 %   Lymphs Abs 1.9 0.7 - 4.0 K/uL   Monocytes Relative 10 %   Monocytes Absolute 1.2 (H) 0.1 - 1.0 K/uL   Eosinophils Relative 1 %   Eosinophils Absolute 0.2 0.0 - 0.7 K/uL   Basophils Relative 0 %   Basophils Absolute 0.0 0.0 - 0.1 K/uL  Glucose, capillary     Status: Abnormal   Collection Time: 08/28/16  7:36 AM  Result Value Ref Range   Glucose-Capillary 210 (H) 65 - 99 mg/dL  Glucose, capillary     Status: Abnormal   Collection Time: 08/28/16 11:53 AM  Result Value Ref Range   Glucose-Capillary 204 (H) 65 - 99 mg/dL  Glucose, capillary     Status: Abnormal   Collection Time: 08/28/16  4:36 PM  Result Value Ref Range   Glucose-Capillary 180 (H) 65 - 99 mg/dL  Glucose, capillary     Status: Abnormal   Collection Time: 08/28/16  8:22 PM  Result Value Ref Range   Glucose-Capillary 165 (H) 65 - 99 mg/dL  Glucose, capillary     Status: Abnormal   Collection Time: 08/29/16  1:20 AM  Result Value Ref Range   Glucose-Capillary 156 (H) 65 - 99 mg/dL  Glucose, capillary     Status: Abnormal   Collection Time: 08/29/16  3:59 AM  Result Value Ref Range   Glucose-Capillary 168 (H) 65 - 99 mg/dL  Glucose, capillary     Status: Abnormal   Collection Time: 08/29/16  7:41 AM  Result Value Ref Range   Glucose-Capillary 165 (H) 65 - 99 mg/dL    Radiology/Results: No results found.  Anti-infectives: Anti-infectives    Start     Dose/Rate Route Frequency Ordered Stop   08/26/16 1130  cefoTEtan in Dextrose 5% (CEFOTAN) IVPB 2 g     2 g Intravenous On call to O.R. 08/26/16 1120 08/26/16 1448      Assessment/Plan: Problem List: Patient Active Problem List   Diagnosis Date Noted  . S/P laparoscopic sleeve gastrectomy July 2018 08/26/2016  . Controlled type 1 diabetes mellitus with  complication, with long-term current use of insulin ([place])   . Preop cardiovascular exam 07/30/2016  . Dyspnea on exertion 07/30/2016  . Morbid obesity ([place]) 07/30/2016  . Bilateral lower extremity edema 07/30/2016    Revisional surgery with adhesiolysis from prior lapband and now sleeve gastrectomy.  Not quite able to go home today-she lives alone and mobility is an issue.  I encouraged her to be up and about more today and hopefullly she will be able to go home tomorrow.  She will need PT at home as she is at risk to fall.  She however needs to be up after her surgery to avoid complications of DVT.   3 Days Post-Op    LOS: 3 days   Matt B. Hassell Done, MD, [facility] [phone] beeper [phone]  08/29/2016 10:28 AM

## 2016-08-29 NOTE — Progress Notes (Signed)
Nutrition Education Note  Received consult for diet education per DROP protocol.   Discussed 2 week post op diet with pt. Emphasized that liquids must be non carbonated, non caffeinated, and sugar free. Fluid goals discussed. Pt to follow up with outpatient bariatric RD for further diet progression after 2 weeks. Multivitamins and minerals also reviewed. Teach back method used, pt expressed understanding, expect good compliance.  Pt was too drowsy during attempted RD visits on 7/4 and 7/5. Bariatric Nurse Coordinator (who is off today) also reported that pt was very drowsy during her visit. Pt is drowsy at this time but much more alert, able to stable stay awake during discussion. She is able to recall visits yesterday and re-iterate information provided by staff. She has been drinking chocolate Premier Protein today without issue. She bough several flavors of Premier Protein for use at home. She also has all of her vitamins and mineral supplements and understands the importance of following schedule for taking these items; specifically as it relates to calcium and multivitamin with iron.    Diet: First 2 Weeks  You will see the nutritionist about two (2) weeks after your surgery. The nutritionist will increase the types of foods you can eat if you are handling liquids well:  If you have severe vomiting or nausea and cannot handle clear liquids lasting longer than 1 day, call your surgeon  Protein Shake  Drink at least 2 ounces of shake 5-6 times per day  Each serving of protein shakes (usually 8 - 12 ounces) should have a minimum of:  15 grams of protein  And no more than 5 grams of carbohydrate  Goal for protein each day:  Men = 80 grams per day  Women = 60 grams per day  Protein powder may be added to fluids such as non-fat milk or Lactaid milk or Soy milk (limit to 35 grams added protein powder per serving)   Hydration  Slowly increase the amount of water and other clear liquids as  tolerated (See Acceptable Fluids)  Slowly increase the amount of protein shake as tolerated  Sip fluids slowly and throughout the day  May use sugar substitutes in small amounts (no more than 6 - 8 packets per day; i.e. Splenda)   Fluid Goal  The first goal is to drink at least 8 ounces of protein shake/drink per day (or as directed by the nutritionist); some examples of protein shakes are Premier Protein, [employer], [employer]R [employer], EAS Edge HP, and [place]jury. See handout from pre-op Bariatric Education Class:  Slowly increase the amount of protein shake you drink as tolerated  You may find it easier to slowly sip shakes throughout the day  It is important to get your proteins in first  Your fluid goal is to drink 64 - 100 ounces of fluid daily  It may take a few weeks to build up to this  32 oz (or more) should be clear liquids  And  32 oz (or more) should be full liquids (see below for examples)  Liquids should not contain sugar, caffeine, or carbonation   Clear Liquids:  Water or Sugar-free flavored water (i.e. Fruit H2O, Propel)  Decaffeinated coffee or tea (sugar-free)  Crystal Lite, Wyler's Lite, Minute Maid Lite  Sugar-free Jell-O  Bouillon or broth  Sugar-free Popsicle: *Less than 20 calories each; Limit 1 per day   Full Liquids:  Protein Shakes/Drinks + 2 choices per day of other full liquids  Full liquids must be:  No  More Than 12 grams of Carbs per serving  No More Than 3 grams of Fat per serving  Strained low-fat cream soup  Non-Fat milk  Fat-free Lactaid Milk  Sugar-free yogurt (Dannon Lite & Fit, [place] yogurt, Oikos Zero)      Jarome Matin, MS, RD, LDN, [facility] Inpatient Clinical Dietitian Pager # [phone] After hours/weekend pager # [phone]

## 2016-08-29 NOTE — Progress Notes (Signed)
Physical Therapy Treatment Patient Details Name: Teresa Golden MRN: [id number] DOB: 17-May-1945 Today's Date: 08/29/2016    History of Present Illness Pt s/p laparoscopic sleeve gastrectomy and with hx of DM and L TKR    PT Comments    Pt is very pleasant and agreeable to working with PT. Overall, pt required Min guard assist for mobility. Increased time and rest breaks necessary during session. Pt c/o 5/10 L arm and R knee pain on today which affected her mobility on today. O2 sat and HR readings WNL during session. Pt has not quite returned to her PLOF. Feel she will benefit from HHPT f/u and a home health aide, if possible. Pt fatigues easily with minimal activity. Pt lives alone and she does not have any assistance set up in home currently. Recommend continued ambulation with nursing staff in addition to PT. Will continue to follow.     Follow Up Recommendations  Home health PT;Supervision - Intermittent (home health aide, if possible, to assist with ADLs)     Equipment Recommendations  Rolling walker with 5" wheels    Recommendations for Other Services       Precautions / Restrictions Precautions Precautions: Fall Restrictions Weight Bearing Restrictions: No    Mobility  Bed Mobility Overal bed mobility: Modified Independent Bed Mobility: Supine to Sit     Supine to sit: HOB elevated;Modified independent (Device/Increase time)     General bed mobility comments: Increased time. Pt relied on bedrail.  Transfers Overall transfer level: Needs assistance Equipment used: 4-wheeled walker;Rolling walker (2 wheeled) Transfers: Sit to/from Stand Sit to Stand: Min guard         General transfer comment: x3. VCs safety, hand placement. Increased time. Pt uses momentum/rocking to rise. Close guard for safety.   Ambulation/Gait Ambulation/Gait assistance: Min guard Ambulation Distance (Feet): 100 Feet (100'x1, 15'x1) Assistive device: Rolling walker (2 wheeled);4-wheeled  walker Gait Pattern/deviations: Step-through pattern;Trunk flexed;Decreased stride length     General Gait Details: cues for posture and position from RW.  Multiple standing rest breaks required again on today. Dyspnea 3/4. Improved stability and safety with use of standard RW compared to rollator. Recommended pt consider using standard RW for safe ambulation until she returns to her baseline.   Stairs            Wheelchair Mobility    Modified Rankin (Stroke Patients Only)       Balance Overall balance assessment: Needs assistance           Standing balance-Leahy Scale: Poor                              Cognition Arousal/Alertness: Awake/alert Behavior During Therapy: WFL for tasks assessed/performed Overall Cognitive Status: Within Functional Limits for tasks assessed                                        Exercises      General Comments        Pertinent Vitals/Pain Pain Assessment: 0-10 Pain Score: 5  Pain Location: R knee, L arm Pain Descriptors / Indicators: Aching;Sore;Tender Pain Intervention(s): Limited activity within patient's tolerance;Repositioned    Home Living                      Prior Function  PT Goals (current goals can now be found in the care plan section) Progress towards PT goals: Progressing toward goals    Frequency    Min 3X/week      PT Plan Current plan remains appropriate    Co-evaluation              AM-PAC PT "6 Clicks" Daily Activity  Outcome Measure  Difficulty turning over in bed (including adjusting bedclothes, sheets and blankets)?: A Little Difficulty moving from lying on back to sitting on the side of the bed? : A Little Difficulty sitting down on and standing up from a chair with arms (e.g., wheelchair, bedside commode, etc,.)?: A Little Help needed moving to and from a bed to chair (including a wheelchair)?: A Little Help needed walking in  hospital room?: A Little Help needed climbing 3-5 steps with a railing? : A Little 6 Click Score: 18    End of Session   Activity Tolerance: Patient limited by fatigue;Patient limited by pain Patient left: in chair;with call bell/phone within reach   PT Visit Diagnosis: Muscle weakness (generalized) (M62.81);Difficulty in walking, not elsewhere classified (R26.2)     Time: 3710-6269 PT Time Calculation (min) (ACUTE ONLY): 32 min  Charges:  $Gait Training: 8-22 mins $Therapeutic Activity: 8-22 mins                    G Codes:          Weston Anna, MPT Pager: [phone]

## 2016-08-29 NOTE — Progress Notes (Signed)
Per request, added bath aide and rolling walker to [facility] orders. [phone]

## 2016-08-30 LAB — GLUCOSE, CAPILLARY
Glucose-Capillary: 178 mg/dL — ABNORMAL HIGH (ref 65–99)
Glucose-Capillary: 187 mg/dL — ABNORMAL HIGH (ref 65–99)
Glucose-Capillary: 203 mg/dL — ABNORMAL HIGH (ref 65–99)

## 2016-08-30 LAB — CBC WITH DIFFERENTIAL/PLATELET
Basophils Absolute: 0 10*3/uL (ref 0.0–0.1)
Basophils Relative: 0 %
EOS PCT: 3 %
Eosinophils Absolute: 0.3 10*3/uL (ref 0.0–0.7)
HCT: 32 % — ABNORMAL LOW (ref 36.0–46.0)
Hemoglobin: 10.8 g/dL — ABNORMAL LOW (ref 12.0–15.0)
LYMPHS ABS: 2.3 10*3/uL (ref 0.7–4.0)
LYMPHS PCT: 18 %
MCH: 28.1 pg (ref 26.0–34.0)
MCHC: 33.8 g/dL (ref 30.0–36.0)
MCV: 83.1 fL (ref 78.0–100.0)
MONO ABS: 1.8 10*3/uL — AB (ref 0.1–1.0)
Monocytes Relative: 14 %
Neutro Abs: 8.8 10*3/uL — ABNORMAL HIGH (ref 1.7–7.7)
Neutrophils Relative %: 65 %
PLATELETS: 165 10*3/uL (ref 150–400)
RBC: 3.85 MIL/uL — ABNORMAL LOW (ref 3.87–5.11)
RDW: 14.3 % (ref 11.5–15.5)
WBC: 13.3 10*3/uL — ABNORMAL HIGH (ref 4.0–10.5)

## 2016-08-30 NOTE — Progress Notes (Signed)
Pt did not drink any protein throughout the night, only ate ice and drank small amounts of water. Nurse and nurse tech encouraged patient multiple times and further education about protein was reinforced. Patient still continued to eat ice instead of shake/protein.

## 2016-08-30 NOTE — Discharge Summary (Signed)
Physician Discharge Summary [facility]  Patient ID: TINESHIA BECRAFT MRN: [id number] DOB/AGE: 09-24-1945 71 y.o.  Admit date: 08/26/2016 Discharge date: 08/30/2016  Admission Diagnoses:  Morbid obesity  Discharge Diagnoses:  Active Problems:   S/P laparoscopic sleeve gastrectomy July 2018   Discharged Condition: good  Hospital Course: Patient was admitted for observation following laparoscopic gastric sleeve resection surgery.  Post op course was uncomplicated.  Pain was well controlled.  Tolerated bariatric diet.  Physical therapy arranged and walker and bedside commode to home.  Patient was prepared for discharge home on POD#4.  Consults: None  Treatments: surgery: lap sleeve gastrectomy, endoscopy  Discharge Exam: Blood pressure (!) 139/40, pulse 75, temperature 99.5 F (37.5 C), temperature source Oral, resp. rate 18, height 5\' 6"  (1.676 m), weight 126 kg (277 lb 11.2 oz), SpO2 94 %. HEENT - clear Neck - soft Chest - clear bilaterally Cor - RRR Abd - soft, obese; BS present; wounds dry and intact with Dermabond  Disposition: Home  Discharge Instructions    Diet - low sodium heart healthy    Complete by:  As directed    Increase activity slowly    Complete by:  As directed    No wound care    Complete by:  As directed      Allergies as of 08/30/2016   No Known Allergies     Medication List    TAKE these medications   B-complex with vitamin C tablet Take 1 tablet by mouth daily.   carvedilol 20 MG 24 hr capsule Commonly known as:  COREG CR Take 20 mg by mouth daily. Notes to patient:  Monitor Blood Pressure Daily and keep a log for primary care physician.  You may need to make changes to your medications with rapid weight loss.     Cholecalciferol 5000 units Tabs Take 5,000 Units by mouth daily.   colchicine 0.6 MG tablet Take 1 tablet (0.6 mg total) by mouth daily. What changed:  when to take this  reasons to take this   diclofenac  sodium 1 % Gel Commonly known as:  VOLTAREN Apply 2 g topically 2 (two) times daily as needed (pain).   gabapentin 300 MG capsule Commonly known as:  NEURONTIN Take 300 mg by mouth 2 (two) times daily.   HYDROcodone-acetaminophen 5-325 MG tablet Commonly known as:  NORCO/VICODIN Take 1 tablet by mouth every 4 (four) hours as needed.   insulin aspart protamine- aspart (70-30) 100 UNIT/ML injection Commonly known as:  NOVOLOG MIX 70/30 Inject 35-40 Units into the skin 2 (two) times daily with a meal. Depending on CGB--Takes 35 units in AM and 40 units ion the evening Notes to patient:  Monitor Blood Sugar Frequently and keep a log for primary care physician, you may need to adjust medication dosage with rapid weight loss.     levothyroxine 25 MCG tablet Commonly known as:  SYNTHROID, LEVOTHROID Take 12.5-25 mcg by mouth See admin instructions. Take half tab Mon-Fri and whole tab Sat and Sun   multivitamin with minerals tablet Take 1 tablet by mouth daily.   naproxen sodium 220 MG tablet Commonly known as:  ANAPROX Take 440 mg by mouth 2 (two) times daily as needed (pain). Notes to patient:  Avoid NSAIDs for 6-8 weeks after surgery   simvastatin 10 MG tablet Commonly known as:  ZOCOR Take 10 mg by mouth daily at 6 PM.   valsartan 160 MG tablet Commonly known as:  DIOVAN Take 1  tablet by mouth at bedtime. Notes to patient:  Monitor Blood Pressure Daily and keep a log for primary care physician.  You may need to make changes to your medications with rapid weight loss.              Durable Medical Equipment        Start     Ordered   08/29/16 [phone]  For home use only DME Walker rolling  Once    Question:  Patient needs a walker to treat with the following condition  Answer:  Mobility impaired   08/29/16 4239     Follow-up Information    Johnathan Hausen, MD. Go on 09/11/2016.   Specialty:  General Surgery Why:  at [place] information: [address] [place] [address] [phone]        Johnathan Hausen, MD Follow up.   Specialty:  General Surgery Contact information: [address] [place] [address] [phone]        Care, [facility] Follow up.   Specialty:  Home Health Services Why:  physical therapy and bath aide Contact information: [address] [place] [address] [phone]        [facility] - Dme Follow up.   Why:  rolling walker Contact information: [place] [address] [place], MD, [facility] Office: [phone]   Signed: Earnstine Regal 08/30/2016, 7:45 AM

## 2016-08-30 NOTE — Progress Notes (Signed)
PT Cancellation Note  Patient Details Name: Teresa Golden MRN: [id number] DOB: December 29, 1945   Cancelled Treatment:    Reason Eval/Treat Not Completed: Pt is set to d/c home. Offered PT session-pt declined to participate.   Weston Anna, MPT Pager: [phone]

## 2016-08-30 NOTE — Progress Notes (Signed)
Assessment unchanged. Pt verbalized understanding of dc instructions through teach back as well as when to follow up with Dr. Hassell Done. Bariatric teaching completed earlier in week by bariatric nurse coordinator. No further questions at this time. No scripts at time of dc. Discharged via wc to front entrance to meet awaiting vehicle and relative to carry home. Accompanied by NT. [place] to follow pt post discharge.

## 2016-09-01 ENCOUNTER — Ambulatory Visit: Payer: Medicare HMO | Admitting: Cardiovascular Disease

## 2016-09-01 DIAGNOSIS — Z9884 Bariatric surgery status: Secondary | ICD-10-CM | POA: Diagnosis not present

## 2016-09-01 DIAGNOSIS — M6281 Muscle weakness (generalized): Secondary | ICD-10-CM | POA: Diagnosis not present

## 2016-09-01 DIAGNOSIS — E1140 Type 2 diabetes mellitus with diabetic neuropathy, unspecified: Secondary | ICD-10-CM | POA: Diagnosis not present

## 2016-09-01 DIAGNOSIS — R262 Difficulty in walking, not elsewhere classified: Secondary | ICD-10-CM | POA: Diagnosis not present

## 2016-09-03 DIAGNOSIS — Z9884 Bariatric surgery status: Secondary | ICD-10-CM | POA: Diagnosis not present

## 2016-09-03 DIAGNOSIS — M6281 Muscle weakness (generalized): Secondary | ICD-10-CM | POA: Diagnosis not present

## 2016-09-03 DIAGNOSIS — R262 Difficulty in walking, not elsewhere classified: Secondary | ICD-10-CM | POA: Diagnosis not present

## 2016-09-03 DIAGNOSIS — E1140 Type 2 diabetes mellitus with diabetic neuropathy, unspecified: Secondary | ICD-10-CM | POA: Diagnosis not present

## 2016-09-04 DIAGNOSIS — M6281 Muscle weakness (generalized): Secondary | ICD-10-CM | POA: Diagnosis not present

## 2016-09-04 DIAGNOSIS — Z9884 Bariatric surgery status: Secondary | ICD-10-CM | POA: Diagnosis not present

## 2016-09-04 DIAGNOSIS — R262 Difficulty in walking, not elsewhere classified: Secondary | ICD-10-CM | POA: Diagnosis not present

## 2016-09-04 DIAGNOSIS — E1140 Type 2 diabetes mellitus with diabetic neuropathy, unspecified: Secondary | ICD-10-CM | POA: Diagnosis not present

## 2016-09-05 DIAGNOSIS — M6281 Muscle weakness (generalized): Secondary | ICD-10-CM | POA: Diagnosis not present

## 2016-09-05 DIAGNOSIS — E1140 Type 2 diabetes mellitus with diabetic neuropathy, unspecified: Secondary | ICD-10-CM | POA: Diagnosis not present

## 2016-09-05 DIAGNOSIS — R262 Difficulty in walking, not elsewhere classified: Secondary | ICD-10-CM | POA: Diagnosis not present

## 2016-09-05 DIAGNOSIS — Z9884 Bariatric surgery status: Secondary | ICD-10-CM | POA: Diagnosis not present

## 2016-09-08 ENCOUNTER — Telehealth (HOSPITAL_COMMUNITY): Payer: Self-pay

## 2016-09-08 DIAGNOSIS — R262 Difficulty in walking, not elsewhere classified: Secondary | ICD-10-CM | POA: Diagnosis not present

## 2016-09-08 DIAGNOSIS — M6281 Muscle weakness (generalized): Secondary | ICD-10-CM | POA: Diagnosis not present

## 2016-09-08 DIAGNOSIS — E1140 Type 2 diabetes mellitus with diabetic neuropathy, unspecified: Secondary | ICD-10-CM | POA: Diagnosis not present

## 2016-09-08 DIAGNOSIS — Z9884 Bariatric surgery status: Secondary | ICD-10-CM | POA: Diagnosis not present

## 2016-09-08 NOTE — Telephone Encounter (Signed)
Made discharge phone call to patient. Asking the following questions.    1. Do you have someone to care for you now that you are home?  Has physical therapy, occupational therpaist, home care aid 2. Are you having pain now that is not relieved by your pain medication?   3. Are you able to drink the recommended daily amount of fluids (48 ounces minimum/day) and protein (60-80 grams/day) as prescribed by the dietitian or nutritional counselor?  Only taking in 1/2 protein shake discussed increasing protein, patient did share she has been eating soups from [employer] because they sooth her.  We discussed not progressing diet to soon 4. Are you taking the vitamins and minerals as prescribed?  No problems 5. Do you have the "on call" number to contact your surgeon if you have a problem or question? yes  6. Are your incisions free of redness, swelling or drainage? (If steri strips, address that these can fall off, shower as tolerated) no problems 7. Have your bowels moved since your surgery?  If not, are you passing gas?  yes 8. Are you up and walking 3-4 times per day?  See above 9. Were you provided your discharge medications before your surgery or before you were discharged from the hospital and are you taking them without problem?  yes

## 2016-09-09 ENCOUNTER — Encounter: Payer: Medicare HMO | Attending: Surgery | Admitting: Skilled Nursing Facility1

## 2016-09-09 ENCOUNTER — Encounter: Payer: Self-pay | Admitting: Skilled Nursing Facility1

## 2016-09-09 DIAGNOSIS — Z6841 Body Mass Index (BMI) 40.0 and over, adult: Secondary | ICD-10-CM | POA: Insufficient documentation

## 2016-09-09 DIAGNOSIS — Z713 Dietary counseling and surveillance: Secondary | ICD-10-CM | POA: Insufficient documentation

## 2016-09-09 DIAGNOSIS — E1140 Type 2 diabetes mellitus with diabetic neuropathy, unspecified: Secondary | ICD-10-CM | POA: Diagnosis not present

## 2016-09-09 DIAGNOSIS — M6281 Muscle weakness (generalized): Secondary | ICD-10-CM | POA: Diagnosis not present

## 2016-09-09 DIAGNOSIS — E108 Type 1 diabetes mellitus with unspecified complications: Secondary | ICD-10-CM

## 2016-09-09 DIAGNOSIS — R262 Difficulty in walking, not elsewhere classified: Secondary | ICD-10-CM | POA: Diagnosis not present

## 2016-09-09 DIAGNOSIS — Z9884 Bariatric surgery status: Secondary | ICD-10-CM | POA: Diagnosis not present

## 2016-09-09 DIAGNOSIS — E119 Type 2 diabetes mellitus without complications: Secondary | ICD-10-CM | POA: Insufficient documentation

## 2016-09-09 NOTE — Progress Notes (Addendum)
Bariatric Class:  Appt start time: 1530 end time:  1630.  2 Week Post-Operative Nutrition Class  Patient was seen on 09/09/2016 for Post-Operative Nutrition education at [facility].   Pt states she checks her blood sugar 2 times a day: 170. Pt stated she was cold so dietitian told her the side fo the table she was sitting on is directly under the vent and I do not have control over the temperature, perhaps trying to sit on the other side is warmer.  Pt states she is cold and has to get to the bank so she needs to leave early.   Surgery date: 08/26/2016 Surgery type: sleeve gastrectomy Start weight at [facility]: 291.1 Weight today: pt declined   The following the learning objectives were met by the patient during this course:  Identifies Phase 3A (Soft, High Proteins) Dietary Goals and will begin from 2 weeks post-operatively to 2 months post-operatively  Identifies appropriate sources of fluids and proteins   States protein recommendations and appropriate sources post-operatively  Identifies the need for appropriate texture modifications, mastication, and bite sizes when consuming solids  Identifies appropriate multivitamin and calcium sources post-operatively  Describes the need for physical activity post-operatively and will follow MD recommendations  States when to call healthcare provider regarding medication questions or post-operative complications  Handouts given during class include:  Phase 3A: Soft, High Protein Diet Handout  Follow-Up Plan: Patient will follow-up at [facility] in 6 weeks for 2 month post-op nutrition visit for diet advancement per MD.

## 2016-09-10 ENCOUNTER — Telehealth: Payer: Self-pay | Admitting: Skilled Nursing Facility1

## 2016-09-10 ENCOUNTER — Telehealth (HOSPITAL_COMMUNITY): Payer: Self-pay

## 2016-09-10 DIAGNOSIS — M6281 Muscle weakness (generalized): Secondary | ICD-10-CM | POA: Diagnosis not present

## 2016-09-10 DIAGNOSIS — R262 Difficulty in walking, not elsewhere classified: Secondary | ICD-10-CM | POA: Diagnosis not present

## 2016-09-10 DIAGNOSIS — E1140 Type 2 diabetes mellitus with diabetic neuropathy, unspecified: Secondary | ICD-10-CM | POA: Diagnosis not present

## 2016-09-10 DIAGNOSIS — Z9884 Bariatric surgery status: Secondary | ICD-10-CM | POA: Diagnosis not present

## 2016-09-10 NOTE — Telephone Encounter (Signed)
Patient called with concerns regarding postop visit at [facility].  Discussed the diet and fluid intake.  Patient would like to be weighed since not weighed at appointment.  Will follow up with [facility].

## 2016-09-10 NOTE — Telephone Encounter (Signed)
Dietitian called to apologize to the pt for the miscommunication and work out a time for her to get weighed.  Dietitian left a message.

## 2016-09-11 ENCOUNTER — Telehealth: Payer: Self-pay | Admitting: Skilled Nursing Facility1

## 2016-09-11 DIAGNOSIS — Z9884 Bariatric surgery status: Secondary | ICD-10-CM | POA: Diagnosis not present

## 2016-09-11 DIAGNOSIS — M6281 Muscle weakness (generalized): Secondary | ICD-10-CM | POA: Diagnosis not present

## 2016-09-11 DIAGNOSIS — E1140 Type 2 diabetes mellitus with diabetic neuropathy, unspecified: Secondary | ICD-10-CM | POA: Diagnosis not present

## 2016-09-11 DIAGNOSIS — R262 Difficulty in walking, not elsewhere classified: Secondary | ICD-10-CM | POA: Diagnosis not present

## 2016-09-11 NOTE — Telephone Encounter (Signed)
Pt came in for [place]ita reading. Pt struggled to stand long enough and needed assistance in order to keep standing for the reading. Pt had a helper with her to assist her.   TANITA  BODY COMP RESULTS  09/11/2016   BMI (kg/m^2) 40.3   Fat Mass (lbs) 120.8   Fat Free Mass (lbs) 128.6   Total Body Water (lbs) 93.2     Weight: 249.4 pounds

## 2016-09-15 ENCOUNTER — Telehealth: Payer: Self-pay | Admitting: Skilled Nursing Facility1

## 2016-09-15 DIAGNOSIS — R262 Difficulty in walking, not elsewhere classified: Secondary | ICD-10-CM | POA: Diagnosis not present

## 2016-09-15 DIAGNOSIS — E1140 Type 2 diabetes mellitus with diabetic neuropathy, unspecified: Secondary | ICD-10-CM | POA: Diagnosis not present

## 2016-09-15 DIAGNOSIS — Z9884 Bariatric surgery status: Secondary | ICD-10-CM | POA: Diagnosis not present

## 2016-09-15 DIAGNOSIS — M6281 Muscle weakness (generalized): Secondary | ICD-10-CM | POA: Diagnosis not present

## 2016-09-15 NOTE — Telephone Encounter (Signed)
Pt called questioning a 13 pound difference between her surgeons scale and the dietitian scale.

## 2016-09-16 DIAGNOSIS — E1140 Type 2 diabetes mellitus with diabetic neuropathy, unspecified: Secondary | ICD-10-CM | POA: Diagnosis not present

## 2016-09-16 DIAGNOSIS — Z9884 Bariatric surgery status: Secondary | ICD-10-CM | POA: Diagnosis not present

## 2016-09-16 DIAGNOSIS — R262 Difficulty in walking, not elsewhere classified: Secondary | ICD-10-CM | POA: Diagnosis not present

## 2016-09-16 DIAGNOSIS — M6281 Muscle weakness (generalized): Secondary | ICD-10-CM | POA: Diagnosis not present

## 2016-09-17 DIAGNOSIS — R262 Difficulty in walking, not elsewhere classified: Secondary | ICD-10-CM | POA: Diagnosis not present

## 2016-09-17 DIAGNOSIS — Z9884 Bariatric surgery status: Secondary | ICD-10-CM | POA: Diagnosis not present

## 2016-09-17 DIAGNOSIS — M6281 Muscle weakness (generalized): Secondary | ICD-10-CM | POA: Diagnosis not present

## 2016-09-17 DIAGNOSIS — E1140 Type 2 diabetes mellitus with diabetic neuropathy, unspecified: Secondary | ICD-10-CM | POA: Diagnosis not present

## 2016-09-18 DIAGNOSIS — R262 Difficulty in walking, not elsewhere classified: Secondary | ICD-10-CM | POA: Diagnosis not present

## 2016-09-18 DIAGNOSIS — Z9884 Bariatric surgery status: Secondary | ICD-10-CM | POA: Diagnosis not present

## 2016-09-18 DIAGNOSIS — E1140 Type 2 diabetes mellitus with diabetic neuropathy, unspecified: Secondary | ICD-10-CM | POA: Diagnosis not present

## 2016-09-18 DIAGNOSIS — M6281 Muscle weakness (generalized): Secondary | ICD-10-CM | POA: Diagnosis not present

## 2016-09-19 DIAGNOSIS — Z9884 Bariatric surgery status: Secondary | ICD-10-CM | POA: Diagnosis not present

## 2016-09-19 DIAGNOSIS — E1140 Type 2 diabetes mellitus with diabetic neuropathy, unspecified: Secondary | ICD-10-CM | POA: Diagnosis not present

## 2016-09-19 DIAGNOSIS — M6281 Muscle weakness (generalized): Secondary | ICD-10-CM | POA: Diagnosis not present

## 2016-09-19 DIAGNOSIS — R262 Difficulty in walking, not elsewhere classified: Secondary | ICD-10-CM | POA: Diagnosis not present

## 2016-09-22 ENCOUNTER — Telehealth (HOSPITAL_COMMUNITY): Payer: Self-pay

## 2016-09-22 DIAGNOSIS — R262 Difficulty in walking, not elsewhere classified: Secondary | ICD-10-CM | POA: Diagnosis not present

## 2016-09-22 DIAGNOSIS — M6281 Muscle weakness (generalized): Secondary | ICD-10-CM | POA: Diagnosis not present

## 2016-09-22 DIAGNOSIS — E1140 Type 2 diabetes mellitus with diabetic neuropathy, unspecified: Secondary | ICD-10-CM | POA: Diagnosis not present

## 2016-09-22 DIAGNOSIS — Z9884 Bariatric surgery status: Secondary | ICD-10-CM | POA: Diagnosis not present

## 2016-09-22 NOTE — Telephone Encounter (Signed)
Patient experiencing constipation wants to know if she can take stool softener.  Instructed she can but if she continues to feel constipation to reach out to surgeon

## 2016-09-24 DIAGNOSIS — M6281 Muscle weakness (generalized): Secondary | ICD-10-CM | POA: Diagnosis not present

## 2016-09-24 DIAGNOSIS — R262 Difficulty in walking, not elsewhere classified: Secondary | ICD-10-CM | POA: Diagnosis not present

## 2016-09-24 DIAGNOSIS — Z9884 Bariatric surgery status: Secondary | ICD-10-CM | POA: Diagnosis not present

## 2016-09-24 DIAGNOSIS — E1140 Type 2 diabetes mellitus with diabetic neuropathy, unspecified: Secondary | ICD-10-CM | POA: Diagnosis not present

## 2016-09-25 DIAGNOSIS — M6281 Muscle weakness (generalized): Secondary | ICD-10-CM | POA: Diagnosis not present

## 2016-09-25 DIAGNOSIS — Z9884 Bariatric surgery status: Secondary | ICD-10-CM | POA: Diagnosis not present

## 2016-09-25 DIAGNOSIS — E1140 Type 2 diabetes mellitus with diabetic neuropathy, unspecified: Secondary | ICD-10-CM | POA: Diagnosis not present

## 2016-09-25 DIAGNOSIS — R262 Difficulty in walking, not elsewhere classified: Secondary | ICD-10-CM | POA: Diagnosis not present

## 2016-10-02 DIAGNOSIS — E1122 Type 2 diabetes mellitus with diabetic chronic kidney disease: Secondary | ICD-10-CM | POA: Diagnosis not present

## 2016-10-02 DIAGNOSIS — I129 Hypertensive chronic kidney disease with stage 1 through stage 4 chronic kidney disease, or unspecified chronic kidney disease: Secondary | ICD-10-CM | POA: Diagnosis not present

## 2016-10-02 DIAGNOSIS — E039 Hypothyroidism, unspecified: Secondary | ICD-10-CM | POA: Diagnosis not present

## 2016-10-02 DIAGNOSIS — N08 Glomerular disorders in diseases classified elsewhere: Secondary | ICD-10-CM | POA: Diagnosis not present

## 2016-10-02 DIAGNOSIS — N183 Chronic kidney disease, stage 3 (moderate): Secondary | ICD-10-CM | POA: Diagnosis not present

## 2016-10-03 DIAGNOSIS — E1140 Type 2 diabetes mellitus with diabetic neuropathy, unspecified: Secondary | ICD-10-CM | POA: Diagnosis not present

## 2016-10-03 DIAGNOSIS — M6281 Muscle weakness (generalized): Secondary | ICD-10-CM | POA: Diagnosis not present

## 2016-10-03 DIAGNOSIS — Z9884 Bariatric surgery status: Secondary | ICD-10-CM | POA: Diagnosis not present

## 2016-10-03 DIAGNOSIS — R262 Difficulty in walking, not elsewhere classified: Secondary | ICD-10-CM | POA: Diagnosis not present

## 2016-10-08 DIAGNOSIS — R262 Difficulty in walking, not elsewhere classified: Secondary | ICD-10-CM | POA: Diagnosis not present

## 2016-10-08 DIAGNOSIS — Z9884 Bariatric surgery status: Secondary | ICD-10-CM | POA: Diagnosis not present

## 2016-10-08 DIAGNOSIS — E1140 Type 2 diabetes mellitus with diabetic neuropathy, unspecified: Secondary | ICD-10-CM | POA: Diagnosis not present

## 2016-10-08 DIAGNOSIS — M6281 Muscle weakness (generalized): Secondary | ICD-10-CM | POA: Diagnosis not present

## 2016-10-09 DIAGNOSIS — E1140 Type 2 diabetes mellitus with diabetic neuropathy, unspecified: Secondary | ICD-10-CM | POA: Diagnosis not present

## 2016-10-09 DIAGNOSIS — R262 Difficulty in walking, not elsewhere classified: Secondary | ICD-10-CM | POA: Diagnosis not present

## 2016-10-09 DIAGNOSIS — M6281 Muscle weakness (generalized): Secondary | ICD-10-CM | POA: Diagnosis not present

## 2016-10-09 DIAGNOSIS — Z9884 Bariatric surgery status: Secondary | ICD-10-CM | POA: Diagnosis not present

## 2016-10-14 DIAGNOSIS — E119 Type 2 diabetes mellitus without complications: Secondary | ICD-10-CM | POA: Diagnosis not present

## 2016-10-16 DIAGNOSIS — Z9884 Bariatric surgery status: Secondary | ICD-10-CM | POA: Diagnosis not present

## 2016-10-16 DIAGNOSIS — M6281 Muscle weakness (generalized): Secondary | ICD-10-CM | POA: Diagnosis not present

## 2016-10-16 DIAGNOSIS — E1140 Type 2 diabetes mellitus with diabetic neuropathy, unspecified: Secondary | ICD-10-CM | POA: Diagnosis not present

## 2016-10-16 DIAGNOSIS — R262 Difficulty in walking, not elsewhere classified: Secondary | ICD-10-CM | POA: Diagnosis not present

## 2016-10-17 DIAGNOSIS — E1140 Type 2 diabetes mellitus with diabetic neuropathy, unspecified: Secondary | ICD-10-CM | POA: Diagnosis not present

## 2016-10-17 DIAGNOSIS — R262 Difficulty in walking, not elsewhere classified: Secondary | ICD-10-CM | POA: Diagnosis not present

## 2016-10-17 DIAGNOSIS — M6281 Muscle weakness (generalized): Secondary | ICD-10-CM | POA: Diagnosis not present

## 2016-10-17 DIAGNOSIS — Z9884 Bariatric surgery status: Secondary | ICD-10-CM | POA: Diagnosis not present

## 2016-10-21 DIAGNOSIS — Z9884 Bariatric surgery status: Secondary | ICD-10-CM | POA: Diagnosis not present

## 2016-10-21 DIAGNOSIS — R262 Difficulty in walking, not elsewhere classified: Secondary | ICD-10-CM | POA: Diagnosis not present

## 2016-10-21 DIAGNOSIS — M6281 Muscle weakness (generalized): Secondary | ICD-10-CM | POA: Diagnosis not present

## 2016-10-21 DIAGNOSIS — E1140 Type 2 diabetes mellitus with diabetic neuropathy, unspecified: Secondary | ICD-10-CM | POA: Diagnosis not present

## 2016-10-22 ENCOUNTER — Encounter: Payer: Medicare HMO | Attending: Surgery | Admitting: Registered"

## 2016-10-22 ENCOUNTER — Telehealth: Payer: Self-pay | Admitting: Registered"

## 2016-10-22 DIAGNOSIS — Z713 Dietary counseling and surveillance: Secondary | ICD-10-CM | POA: Diagnosis not present

## 2016-10-22 DIAGNOSIS — E119 Type 2 diabetes mellitus without complications: Secondary | ICD-10-CM | POA: Insufficient documentation

## 2016-10-22 DIAGNOSIS — Z6841 Body Mass Index (BMI) 40.0 and over, adult: Secondary | ICD-10-CM | POA: Insufficient documentation

## 2016-10-22 NOTE — Telephone Encounter (Signed)
Pt called to inform RD that she purchased [employer] protein chips and enjoys them. Pt states she is grateful for the recommendation and chips help with giving her a "chip option" and able to get protein at the same time.

## 2016-10-22 NOTE — Patient Instructions (Addendum)
-   Increase protein intake to at least 60g per day with protein shake, greek yogurt, low or reduced fat cheese.   - Use Baritastic App to schedule and track food/protein, fluid, multivitamins, and Ca supplements.   - Aim for at least 64 oz of fluid.

## 2016-10-22 NOTE — Progress Notes (Signed)
Follow-up visit:  8 Weeks Post-Operative Sleeve Gastrectomy Surgery  Medical Nutrition Therapy:  Appt start time: 9:50 end time:  10:50.  Primary concerns today: Post-operative Bariatric Surgery Nutrition Management.  Non scale victories: none stated  Surgery date: 08/26/2016 Surgery type: Sleeve gastrectomy Start weight at [facility]: 291.1 lbs Weight today: 250.0 lbs Weight change: 41.1 lbs from initial weight Total weight lost: 41.1 lbs Weight loss goal: 160-180 lbs, not have to use walker, not to have to use insulin   TANITA  BODY COMP RESULTS  10/22/2016   BMI (kg/m^2) 40.4   Fat Mass (lbs) 118.8   Fat Free Mass (lbs) 131.2   Total Body Water (lbs) 95.0    Pt prefers to be called "Teresa Golden".  Pt states she is feeling a lot better since after surgery; things are getting better day to day. Pt states she is cooking smaller portions and freezes leftovers. Pt states she does not tolerate beef well; only ground beef. Pt states she tolerates [place] well and likes to moisten food by adding water. Pt states she is making changes that works for herself, is learning her body, and making healthy choices. Pt states she notices she used to "pick up something to eat when emotional about something".  Pt states she enjoys jazz and has started listening to it recently. Pt states she enjoys her therapist and can relate to her. Pt states she has tried spring mix and tolerates well. Pt sates she is diabetic and has kidney disease.    Preferred Learning Style:   No preference indicated   Learning Readiness:   Ready  Change in progress  24-hr recall: B (AM): 1-2 eggs (12g), 1/2 sausage patty (3g), grits sometimes or chicken liver Snk (AM): greek yogurt (15g)  L (PM): roasted [place] (7g), spring mix Snk (PM): none  D (PM): none Snk (PM): sometimes sugar-free popsicles or homemade jello with whip cream  Fluid intake: coffee (8oz), water crystal light (32 oz); ~ 40 oz daily Estimated total protein  intake: ~44g protein daily  Medications: See list Supplementation: Women's 50+ multi, Vit B12 1200 mcg, Vit D 5000 IU, Tums 3x/day  CBG monitoring: Checks 2x/day Average CBG per patient: 120-138 Last patient reported A1c: not reported  Using straws: no Drinking while eating: no Having you been chewing well: yes Chewing/swallowing difficulties: no Changes in vision: yes, before having insulin changes Changes to mood/headaches: no, no Hair loss/Changes to skin/Changes to nails: no, no, no Any difficulty focusing or concentrating: no Sweating: no Dizziness/Lightheaded: sometimes Palpitations:  no Carbonated beverages: no N/V/D/C/GAS: no, no, no, no, no Abdominal Pain: no, only when having bowel movement Dumping syndrome: no Last Lap-Band fill: N/A  Recent physical activity:  Arm exercises   Progress Towards Goal(s):  In progress.  Handouts given during visit include:  Snack Ideas for Bariatric Patients   Nutritional Diagnosis:  Inadequate fluid intake As related to bariatric surgery post-op recommendations.  As evidenced by pt reports of less than 64 fluid ounces per day. NI-5.7.1 Inadequate protein intake As related to bariatric surgery post-op recommendations.  As evidenced by pt report of less than 60g protein daily.    Intervention:  Nutrition education and counseling. Pt was educated and counseled on the importance of meeting protein and fluid needs daily, along with ways on how to do that.   Goals: - Increase protein intake to at least 60g per day with protein shake, greek yogurt, low or reduced fat cheese.  - Use Baritastic App to schedule  and track food/protein, fluid, multivitamins, and Ca supplements.  - Aim for at least 64 oz of fluid.  Teaching Method Utilized:  Visual Auditory Hands on  Barriers to learning/adherence to lifestyle change: none  Demonstrated degree of understanding via:  Teach Back   Monitoring/Evaluation:  Dietary intake, exercise, lap  band fills, and body weight. Follow up in 2 weeks for 10 week post-op visit.

## 2016-10-24 DIAGNOSIS — M545 Low back pain: Secondary | ICD-10-CM | POA: Diagnosis not present

## 2016-10-24 DIAGNOSIS — G8929 Other chronic pain: Secondary | ICD-10-CM | POA: Diagnosis not present

## 2016-10-24 DIAGNOSIS — M4316 Spondylolisthesis, lumbar region: Secondary | ICD-10-CM | POA: Diagnosis not present

## 2016-10-24 DIAGNOSIS — M4150 Other secondary scoliosis, site unspecified: Secondary | ICD-10-CM | POA: Diagnosis not present

## 2016-10-29 DIAGNOSIS — Z9884 Bariatric surgery status: Secondary | ICD-10-CM | POA: Diagnosis not present

## 2016-10-29 DIAGNOSIS — E1140 Type 2 diabetes mellitus with diabetic neuropathy, unspecified: Secondary | ICD-10-CM | POA: Diagnosis not present

## 2016-10-29 DIAGNOSIS — R262 Difficulty in walking, not elsewhere classified: Secondary | ICD-10-CM | POA: Diagnosis not present

## 2016-10-29 DIAGNOSIS — M6281 Muscle weakness (generalized): Secondary | ICD-10-CM | POA: Diagnosis not present

## 2016-10-29 DIAGNOSIS — M545 Low back pain: Secondary | ICD-10-CM | POA: Diagnosis not present

## 2016-10-30 ENCOUNTER — Ambulatory Visit (INDEPENDENT_AMBULATORY_CARE_PROVIDER_SITE_OTHER): Payer: Medicare HMO | Admitting: Psychiatry

## 2016-10-30 DIAGNOSIS — F4322 Adjustment disorder with anxiety: Secondary | ICD-10-CM

## 2016-10-30 DIAGNOSIS — R69 Illness, unspecified: Secondary | ICD-10-CM | POA: Diagnosis not present

## 2016-10-30 DIAGNOSIS — F509 Eating disorder, unspecified: Secondary | ICD-10-CM | POA: Diagnosis not present

## 2016-10-31 DIAGNOSIS — M545 Low back pain: Secondary | ICD-10-CM | POA: Diagnosis not present

## 2016-10-31 DIAGNOSIS — R2689 Other abnormalities of gait and mobility: Secondary | ICD-10-CM | POA: Diagnosis not present

## 2016-11-04 ENCOUNTER — Ambulatory Visit: Payer: Self-pay | Admitting: Registered"

## 2016-11-04 DIAGNOSIS — E1122 Type 2 diabetes mellitus with diabetic chronic kidney disease: Secondary | ICD-10-CM | POA: Diagnosis not present

## 2016-11-04 DIAGNOSIS — E1140 Type 2 diabetes mellitus with diabetic neuropathy, unspecified: Secondary | ICD-10-CM | POA: Diagnosis not present

## 2016-11-04 DIAGNOSIS — N183 Chronic kidney disease, stage 3 (moderate): Secondary | ICD-10-CM | POA: Diagnosis not present

## 2016-11-04 DIAGNOSIS — N08 Glomerular disorders in diseases classified elsewhere: Secondary | ICD-10-CM | POA: Diagnosis not present

## 2016-11-05 ENCOUNTER — Other Ambulatory Visit: Payer: Self-pay | Admitting: Internal Medicine

## 2016-11-05 DIAGNOSIS — Z1231 Encounter for screening mammogram for malignant neoplasm of breast: Secondary | ICD-10-CM

## 2016-11-10 DIAGNOSIS — E119 Type 2 diabetes mellitus without complications: Secondary | ICD-10-CM | POA: Diagnosis not present

## 2016-11-12 DIAGNOSIS — M545 Low back pain: Secondary | ICD-10-CM | POA: Diagnosis not present

## 2016-11-12 DIAGNOSIS — R2689 Other abnormalities of gait and mobility: Secondary | ICD-10-CM | POA: Diagnosis not present

## 2016-11-13 ENCOUNTER — Ambulatory Visit (INDEPENDENT_AMBULATORY_CARE_PROVIDER_SITE_OTHER): Payer: Medicare HMO | Admitting: Psychiatry

## 2016-11-13 DIAGNOSIS — F509 Eating disorder, unspecified: Secondary | ICD-10-CM

## 2016-11-13 DIAGNOSIS — F4323 Adjustment disorder with mixed anxiety and depressed mood: Secondary | ICD-10-CM | POA: Diagnosis not present

## 2016-11-13 DIAGNOSIS — R69 Illness, unspecified: Secondary | ICD-10-CM | POA: Diagnosis not present

## 2016-11-21 ENCOUNTER — Encounter: Payer: Self-pay | Admitting: Cardiology

## 2016-11-21 ENCOUNTER — Ambulatory Visit (INDEPENDENT_AMBULATORY_CARE_PROVIDER_SITE_OTHER): Payer: Medicare HMO | Admitting: Cardiology

## 2016-11-21 VITALS — BP 144/76 | HR 60 | Ht 66.0 in | Wt 255.0 lb

## 2016-11-21 DIAGNOSIS — Z9884 Bariatric surgery status: Secondary | ICD-10-CM | POA: Diagnosis not present

## 2016-11-21 DIAGNOSIS — R0600 Dyspnea, unspecified: Secondary | ICD-10-CM

## 2016-11-21 DIAGNOSIS — R0609 Other forms of dyspnea: Secondary | ICD-10-CM | POA: Diagnosis not present

## 2016-11-21 DIAGNOSIS — R2689 Other abnormalities of gait and mobility: Secondary | ICD-10-CM | POA: Diagnosis not present

## 2016-11-21 DIAGNOSIS — R600 Localized edema: Secondary | ICD-10-CM | POA: Diagnosis not present

## 2016-11-21 DIAGNOSIS — M545 Low back pain: Secondary | ICD-10-CM | POA: Diagnosis not present

## 2016-11-21 NOTE — Progress Notes (Signed)
PCP: Glendale Chard, MD  Surgeon: Dr. Hassell Done [facility]  Clinic Note: Chief Complaint  Patient presents with  . Follow-up    shortness of breath, postop GOP    HPI: Teresa Golden is a 71 y.o. female who is being seen today for Preoperative Cardiovascular Evaluation with Exertional Dyspnea for Gastric Sleeve Sgx at the request of Glendale Chard, MD.  She is a morbidly obese woman with a relatively sedentary lifestyle. She was profoundly dyspneic simply walking into the clinic. She is walking with a walker. She has a BMI of 45, she has hypertension, insulin-dependent diabetes. Bilateral lower extremity neuropathy.  Teresa Golden was seen 07/30/2016 for preoperative risk evaluation for her gastric sleeve bypass that was completed successfully in July with no complications.  Recent Hospitalizations:  08/26/2016 success of gastric sleeve bypass surgery with stable postop course  Studies Personally Reviewed - (if available, images/films reviewed: From Epic Chart or Care Everywhere)  Echo Oct 2015: EF 65-70%. Normal diastolic Fxn.  Normal Valves.  Myoview 08/06/2016 - low normal EF 51%. Poorly controlled blood pressure. No ST segment changes. LOW RISK study with no ischemia or infarction.  Interval History:   Teresa Golden returns today for her first postop cardiology follow-up very happy from a overall personal standpoint. She has much more energy and is much less short of breath with her significant weight loss. She is very excited about how much insulin she has reduced from her regimen. She says that every 5 pounds she loses, she is less short of breath. She is relatively convinced that her shortness of breath was related to her weight and was very happy to hear the results were stress test. She is now doing water exercises physical therapy because of her back scoliosis. She is also doing home health PT and OT. She says she is doing much more overall exercise and she had been doing  leading up to her surgery and is very happy about it. Her energy level is notably improved.  Because she is doing a lot more activity than she used to, she is not as comfortable with staying actively hydrated. She indicates that she may get a bit dizzy if dehydrated (realizes that post-op, she cannot drink quickly) - has to drink in sips, so hard to remember to drink enough.  Making a conscious effort.  She really has to play close attention to what she eats, how she eats and how much she drinks so she doesn't have the postsurgical regurgitation or heartburn symptoms.  Cardiovascular ROS: positive for - dyspnea on exertion and this has notably improved negative for - chest pain, edema, irregular heartbeat, murmur, orthopnea, palpitations, paroxysmal nocturnal dyspnea, rapid heart rate, shortness of breath or syncope/near-syncope, TIA/amaurosis fugax, claudication  ROS: A comprehensive was performed. Review of Systems  Constitutional: Positive for weight loss (From pre-op to now 47 Lb total.  ~30 since last visit.). Negative for malaise/fatigue (does get tired - but now it is b/c she is"busy" & not just too tired to do things.  ).  HENT: Negative for congestion and nosebleeds.   Respiratory: Positive for shortness of breath. Negative for wheezing.   Gastrointestinal: Positive for heartburn. Negative for blood in stool and melena.  Genitourinary: Negative for hematuria.  Musculoskeletal: Positive for back pain (scoliosis -- doing pool PT.), joint pain and myalgias.  Neurological: Positive for tingling (Leg PN @ night ).       Bilateral foot neuropathy  Endo/Heme/Allergies: Negative for environmental allergies.  Psychiatric/Behavioral: Positive for depression. Negative for memory loss. The patient does not have insomnia.   All other systems reviewed and are negative.  I have reviewed and (if needed) personally updated the patient's problem list, medications, allergies, past medical and surgical  history, social and family history.   Past Medical History:  Diagnosis Date  . Arthritis   . Diabetes mellitus, type II, insulin dependent ([place])    With neurologic complications. Bilateral lower extremity peripheral neuropathy  . Dyspnea    with excertion  . Endometriosis   . Essential hypertension   . Hepatitis C    C dormant  . Hypothyroidism   . Morbid obesity with BMI of 50.0-59.9, adult [facility])     Past Surgical History:  Procedure Laterality Date  . ABDOMINAL HYSTERECTOMY     uterus  . CHOLECYSTECTOMY    . JOINT REPLACEMENT     Left knee  . LAPAROSCOPIC GASTRIC BANDING     and reversal  . LAPAROSCOPIC GASTRIC SLEEVE RESECTION N/A 08/26/2016   Procedure: LAPAROSCOPIC GASTRIC SLEEVE RESECTION WITH UPPER ENOD;  Surgeon: Johnathan Hausen, MD;  Location: [facility];  Service: General;  Laterality: N/A;  . TONSILLECTOMY    . TRANSTHORACIC ECHOCARDIOGRAM  11/2013   EF 65-70%. Normal diastolic Fxn.  Normal Valves.    Current Meds  Medication Sig  . B Complex-C (B-COMPLEX WITH VITAMIN C) tablet Take 1 tablet by mouth daily.  . carvedilol (COREG CR) 20 MG 24 hr capsule Take 20 mg by mouth daily.   . Cholecalciferol 5000 units TABS Take 5,000 Units by mouth daily.  . colchicine 0.6 MG tablet Take 1 tablet (0.6 mg total) by mouth daily. (Patient taking differently: Take 0.6 mg by mouth daily as needed (FOR FLAREUPS). )  . diclofenac sodium (VOLTAREN) 1 % GEL Apply 2 g topically 2 (two) times daily as needed (pain).  [name] gabapentin (NEURONTIN) 300 MG capsule Take 300 mg by mouth 2 (two) times daily.  . insulin degludec (TRESIBA FLEXTOUCH) 100 UNIT/ML SOPN FlexTouch Pen Inject 22 Units into the skin daily at 10 pm.  . levothyroxine (SYNTHROID, LEVOTHROID) 25 MCG tablet Take 12.5-25 mcg by mouth See admin instructions. Take half tab Mon-Fri and whole tab Sat and Sun  . losartan (COZAAR) 50 MG tablet Take 50 mg by mouth daily.  . Multiple Vitamins-Minerals (MULTIVITAMIN WITH MINERALS) tablet  Take 1 tablet by mouth daily.   . naproxen sodium (ANAPROX) 220 MG tablet Take 440 mg by mouth 2 (two) times daily as needed (pain).  . simvastatin (ZOCOR) 10 MG tablet Take 10 mg by mouth daily at 6 PM.     No Known Allergies  Social History   Social History  . Marital status: Single    Spouse name: N/A  . Number of children: N/A  . Years of education: N/A   Social History Main Topics  . Smoking status: Former Research scientist (life sciences)  . Smokeless tobacco: Never Used     Comment: quit 20 years ago  . Alcohol use 0.6 oz/week    1 Glasses of wine per week     Comment: occasional  . Drug use: No  . Sexual activity: Not Currently   Other Topics Concern  . None   Social History Narrative  . None    family history includes Heart attack in her mother; Heart failure in her mother; Stroke in her father.  Wt Readings from Last 3 Encounters:  11/21/16 255 lb (115.7 kg)  10/22/16 250 lb (113.4 kg)  08/30/16 277 lb 11.2 oz (126 kg)  Total ~47 Lb total since starting the GOP pathway.    PHYSICAL EXAM BP (!) 144/76   Pulse 60   Ht 5\' 6"  (1.676 m)   Wt 255 lb (115.7 kg)   BMI 41.16 kg/m   Physical Exam  Constitutional: She is oriented to person, place, and time. She appears well-developed and well-nourished. No distress.  Notably happier and healthy-appearing. She is definitely lighter and has less increased work of breath.  HENT:  Head: Normocephalic and atraumatic.  Neck: Neck supple. No hepatojugular reflux and no JVD present. Carotid bruit is not present.  Cardiovascular: Normal rate, regular rhythm and normal pulses.   No extrasystoles are present. PMI is not displaced.  Exam reveals no gallop.   No murmur heard. Pulmonary/Chest: Effort normal and breath sounds normal. No respiratory distress. She has no wheezes. She has no rales.  Abdominal: Soft. Bowel sounds are normal. She exhibits no distension. There is tenderness (still little tender at the surgical port sites). There is no rebound  and no guarding.  Musculoskeletal: Normal range of motion. She exhibits edema (trivial). She exhibits no deformity.  Neurological: She is alert and oriented to person, place, and time.  Skin: Skin is warm and dry.  Mild bilateral venous stasis changes on both lower extremities  Psychiatric: She has a normal mood and affect. Her behavior is normal. Judgment and thought content normal.  Nursing note and vitals reviewed.   Adult ECG Report  Rate: 64 ;  Rhythm: normal sinus rhythm and Normal axis, intervals and durations.;   Narrative Interpretation: Essentially normal EKG.  Other studies Reviewed: Additional studies/ records that were reviewed today include:  Recent Labs:    No results found for: CHOL, HDL, LDLCALC, LDLDIRECT, TRIG, CHOLHDL - followed by PCP  Lab Results  Component Value Date   HGBA1C 6.6 (H) 08/21/2016   Lab Results  Component Value Date   CREATININE 0.91 08/26/2016   BUN 21 (H) 08/26/2016   NA 139 08/26/2016   K 4.1 08/26/2016   CL 107 08/26/2016   CO2 23 08/26/2016    ASSESSMENT / PLAN: Problem List Items Addressed This Visit    Bilateral lower extremity edema (Chronic)    Normal EF by Myoview. She probably has some obesity hypoventilation syndrome component. Thankfully, her edema seems to also be improving with weight loss.      Dyspnea on exertion - Primary    Most likely consistent with deconditioning and obesity as opposed to coronary disease based on her stress test.  She is already feeling better and less short of breath with them now weight loss that she has had.  At this point I think she can continue following with her PCP.      Relevant Orders   EKG 12-Lead   Morbid obesity ([place]) (Chronic)   Relevant Orders   EKG 12-Lead   S/P laparoscopic sleeve gastrectomy July 2018    Now status post gastrectomy. Very active better weight loss. We are happy about her medication adjustments as associated with weight loss. She is very conscious of what  she is eating and how she is eating. She hopes to continue dressing as she has been doing for the last month.  As she is becoming more active, she is noting less exertional dyspnea and has not had any angina.      Relevant Orders   EKG 12-Lead      Current medicines are reviewed at length with  the patient today. (+/- concerns) n/a The following changes have been made: n/a  Patient Instructions  NO CHANGE WITH CURRENT MEDICATIONS AND TREATMENT.   Your physician recommends that you schedule a follow-up appointment on as needed basis   Studies Ordered:   Orders Placed This Encounter  Procedures  . EKG 12-Lead      Glenetta Hew, M.D., M.S. Interventional Cardiologist   Pager # [phone] Phone # [phone] [address] [place], [place] [address]

## 2016-11-21 NOTE — Patient Instructions (Signed)
NO CHANGE WITH CURRENT MEDICATIONS AND TREATMENT.   Your physician recommends that you schedule a follow-up appointment on as needed basis

## 2016-11-23 ENCOUNTER — Encounter: Payer: Self-pay | Admitting: Cardiology

## 2016-11-23 NOTE — Assessment & Plan Note (Signed)
Most likely consistent with deconditioning and obesity as opposed to coronary disease based on her stress test.  She is already feeling better and less short of breath with them now weight loss that she has had.  At this point I think she can continue following with her PCP.

## 2016-11-23 NOTE — Assessment & Plan Note (Signed)
Now status post gastrectomy. Very active better weight loss. We are happy about her medication adjustments as associated with weight loss. She is very conscious of what she is eating and how she is eating. She hopes to continue dressing as she has been doing for the last month.  As she is becoming more active, she is noting less exertional dyspnea and has not had any angina.

## 2016-11-23 NOTE — Assessment & Plan Note (Signed)
Normal EF by Myoview. She probably has some obesity hypoventilation syndrome component. Thankfully, her edema seems to also be improving with weight loss.

## 2016-11-26 DIAGNOSIS — M545 Low back pain: Secondary | ICD-10-CM | POA: Diagnosis not present

## 2016-11-26 DIAGNOSIS — R2689 Other abnormalities of gait and mobility: Secondary | ICD-10-CM | POA: Diagnosis not present

## 2016-11-26 DIAGNOSIS — R69 Illness, unspecified: Secondary | ICD-10-CM | POA: Diagnosis not present

## 2016-11-27 DIAGNOSIS — G8929 Other chronic pain: Secondary | ICD-10-CM | POA: Diagnosis not present

## 2016-11-27 DIAGNOSIS — M545 Low back pain: Secondary | ICD-10-CM | POA: Diagnosis not present

## 2016-11-27 DIAGNOSIS — M4316 Spondylolisthesis, lumbar region: Secondary | ICD-10-CM | POA: Diagnosis not present

## 2016-11-27 DIAGNOSIS — M419 Scoliosis, unspecified: Secondary | ICD-10-CM | POA: Diagnosis not present

## 2016-12-03 ENCOUNTER — Ambulatory Visit: Payer: Self-pay | Admitting: Skilled Nursing Facility1

## 2016-12-04 ENCOUNTER — Ambulatory Visit: Payer: Self-pay | Admitting: Registered"

## 2016-12-04 DIAGNOSIS — M79672 Pain in left foot: Secondary | ICD-10-CM | POA: Diagnosis not present

## 2016-12-04 DIAGNOSIS — M79671 Pain in right foot: Secondary | ICD-10-CM | POA: Diagnosis not present

## 2016-12-04 DIAGNOSIS — E1151 Type 2 diabetes mellitus with diabetic peripheral angiopathy without gangrene: Secondary | ICD-10-CM | POA: Diagnosis not present

## 2016-12-04 DIAGNOSIS — B351 Tinea unguium: Secondary | ICD-10-CM | POA: Diagnosis not present

## 2016-12-04 DIAGNOSIS — M1711 Unilateral primary osteoarthritis, right knee: Secondary | ICD-10-CM | POA: Diagnosis not present

## 2016-12-04 DIAGNOSIS — M25561 Pain in right knee: Secondary | ICD-10-CM | POA: Diagnosis not present

## 2016-12-04 DIAGNOSIS — M1712 Unilateral primary osteoarthritis, left knee: Secondary | ICD-10-CM | POA: Diagnosis not present

## 2016-12-04 DIAGNOSIS — M25562 Pain in left knee: Secondary | ICD-10-CM | POA: Diagnosis not present

## 2016-12-04 DIAGNOSIS — M1710 Unilateral primary osteoarthritis, unspecified knee: Secondary | ICD-10-CM | POA: Diagnosis not present

## 2016-12-05 ENCOUNTER — Ambulatory Visit: Payer: Self-pay

## 2016-12-09 ENCOUNTER — Ambulatory Visit
Admission: RE | Admit: 2016-12-09 | Discharge: 2016-12-09 | Disposition: A | Discharge status: Home / Self Care | Payer: Medicare HMO | Source: Ambulatory Visit | Attending: Internal Medicine | Admitting: Internal Medicine

## 2016-12-09 ENCOUNTER — Ambulatory Visit (INDEPENDENT_AMBULATORY_CARE_PROVIDER_SITE_OTHER): Payer: Medicare HMO | Admitting: Psychiatry

## 2016-12-09 DIAGNOSIS — F509 Eating disorder, unspecified: Secondary | ICD-10-CM

## 2016-12-09 DIAGNOSIS — Z1231 Encounter for screening mammogram for malignant neoplasm of breast: Secondary | ICD-10-CM | POA: Diagnosis not present

## 2016-12-09 DIAGNOSIS — F4323 Adjustment disorder with mixed anxiety and depressed mood: Secondary | ICD-10-CM | POA: Diagnosis not present

## 2016-12-09 DIAGNOSIS — R2689 Other abnormalities of gait and mobility: Secondary | ICD-10-CM | POA: Diagnosis not present

## 2016-12-09 DIAGNOSIS — M545 Low back pain: Secondary | ICD-10-CM | POA: Diagnosis not present

## 2016-12-09 DIAGNOSIS — R69 Illness, unspecified: Secondary | ICD-10-CM | POA: Diagnosis not present

## 2016-12-11 DIAGNOSIS — R2689 Other abnormalities of gait and mobility: Secondary | ICD-10-CM | POA: Diagnosis not present

## 2016-12-11 DIAGNOSIS — M545 Low back pain: Secondary | ICD-10-CM | POA: Diagnosis not present

## 2016-12-16 DIAGNOSIS — Z23 Encounter for immunization: Secondary | ICD-10-CM | POA: Diagnosis not present

## 2016-12-16 DIAGNOSIS — R2689 Other abnormalities of gait and mobility: Secondary | ICD-10-CM | POA: Diagnosis not present

## 2016-12-16 DIAGNOSIS — M545 Low back pain: Secondary | ICD-10-CM | POA: Diagnosis not present

## 2016-12-17 ENCOUNTER — Encounter: Payer: Self-pay | Admitting: Registered"

## 2016-12-17 ENCOUNTER — Encounter: Payer: Medicare HMO | Attending: Surgery | Admitting: Registered"

## 2016-12-17 DIAGNOSIS — Z6841 Body Mass Index (BMI) 40.0 and over, adult: Secondary | ICD-10-CM | POA: Insufficient documentation

## 2016-12-17 DIAGNOSIS — Z713 Dietary counseling and surveillance: Secondary | ICD-10-CM | POA: Insufficient documentation

## 2016-12-17 DIAGNOSIS — Z96652 Presence of left artificial knee joint: Secondary | ICD-10-CM | POA: Diagnosis not present

## 2016-12-17 DIAGNOSIS — E108 Type 1 diabetes mellitus with unspecified complications: Secondary | ICD-10-CM

## 2016-12-17 DIAGNOSIS — E119 Type 2 diabetes mellitus without complications: Secondary | ICD-10-CM | POA: Diagnosis not present

## 2016-12-17 DIAGNOSIS — M1711 Unilateral primary osteoarthritis, right knee: Secondary | ICD-10-CM | POA: Diagnosis not present

## 2016-12-17 NOTE — Patient Instructions (Addendum)
-   Try using Baritastic App to track food and fluid intake.   - Try [employer] [facility] multivitamin.  - Aim to consume 60 grams of protein a day.  - Aim for at least 64 ounces of fluid a day. Keep infused water bottle on you when running errands.   - Add in protein snack options between meals such as greek yogurt, nuts, cheese, etc.   - Aim to increase protein intake during breakfast.

## 2016-12-17 NOTE — Progress Notes (Signed)
Follow-up visit:  4 Months Post-Operative Sleeve Gastrectomy Surgery  Medical Nutrition Therapy:  Appt start time: 10:30 end time:  11:32.  Primary concerns today: Post-operative Bariatric Surgery Nutrition Management.  Non scale victories: clothes are more comfortable  Surgery date: 08/26/2016 Surgery type: Sleeve gastrectomy Start weight at [facility]: 291.1 lbs Weight today: 242.2 lbs Weight change: 7.8 lbs from 250 lbs on 10/22/2016 Total weight lost: 48.9 lbs   Weight loss goal: 160-180 lbs, not have to use walker, not to have to use insulin   TANITA  BODY COMP RESULTS  10/22/2016 12/17/2016   BMI (kg/m^2) 40.4 39.1   Fat Mass (lbs) 118.8 109.4   Fat Free Mass (lbs) 131.2 132.8   Total Body Water (lbs) 95.0 95.8    Pt prefers to be called "Teresa Golden". Pt is talkative.   Pt states she was told that she would be off insulin by Thanksgiving. Pt states she has fallen twice in October and is attending water therapy for scoliosis in back. Pt states she sees Dr. Ouida Sills as psychiatrist. Pt states she does not like to drink plain water, prefers infused or using flavor packs. Pt states she does not tolerate beef well. Pt states she has had one scoop of ice cream once since surgery. Pt is not consistently meeting protein or fluid needs. Pt states she is taking other vitamins until she completes what she already has at home, then she will switch to bariatric-specific vitamins.   Pt states she is feeling a lot better since after surgery; things are getting better day to day. Pt states she is cooking smaller portions and freezes leftovers. Pt states she tolerates [place] well and likes to moisten food by adding water. Pt states she is making changes that works for herself, is learning her body, and making healthy choices. Pt states she notices she used to "pick up something to eat when emotional about something".  Pt states she enjoys jazz and has started listening to it recently. Pt states she enjoys her  therapist and can relate to her. Pt states she has tried spring mix and tolerates well. Pt sates she is diabetic and has kidney disease.    Preferred Learning Style:   No preference indicated   Learning Readiness:   Ready  Change in progress  24-hr recall: B (AM): whole wheat toast, 1 slice of bacon (3g), 1/2 pink grapefruit Snk (AM): pistachios (7g)  L (PM): 2 oz chicken (14g), baked sweet potato  Snk (PM): none  D (PM): 2 oz stewed chicken (14g) with vegetables Snk (PM): 1 oz chicken (7g), collard greens sometimes oatmeal cookie or 1 piece of hard candy or sugar-free popsicles or homemade jello with whip cream  Fluid intake: coffee (8oz), infused water crystal light (40 oz); ~48 oz daily Estimated total protein intake: ~45g protein daily  Medications: See list, decreased [name]  Supplementation: Women's 50+ multi, Vit B12 1200 mcg, Vit D 5000 IU, Tums 3x/day  CBG monitoring: Checks 2x/day Average CBG per patient: 80-125 Last patient reported A1c: not reported  Using straws: no Drinking while eating: sometimes Having you been chewing well: yes Chewing/swallowing difficulties: no Changes in vision: sometimes Changes to mood/headaches: no, no Hair loss/Changes to skin/Changes to nails: no, dryer skin, no Any difficulty focusing or concentrating: no Sweating: no Dizziness/Lightheaded: sometimes Palpitations:  no Carbonated beverages: no N/V/D/C/GAS: no, no, no, sometimes, no Abdominal Pain: no, only when having bowel movement Dumping syndrome: once Last Lap-Band fill: N/A  Recent physical activity:  Water  therapy 60 min, 3x/week   Progress Towards Goal(s):  In progress.  Handouts given during visit include:  Protein options   Nutritional Diagnosis:  Inadequate fluid intake As related to bariatric surgery post-op recommendations.  As evidenced by pt reports of less than 64 fluid ounces per day. NI-5.7.1 Inadequate protein intake As related to bariatric surgery  post-op recommendations.  As evidenced by pt report of less than 60g protein daily.    Intervention:  Nutrition education and counseling. Pt was educated and counseled on the importance of meeting protein and fluid needs daily, along with ways on how to do that. Pt was given [facility] Bariatric Multivitamin chewable to try: Lot # [id number] Exp 08/2018  Goals: - Try using [employer] App to track food and fluid intake.  - Try [facility] multivitamin. - Aim to consume 60 grams of protein a day. - Aim for at least 64 ounces of fluid a day. Keep infused water bottle on you when running errands.  - Add in protein snack options between meals such as greek yogurt, nuts, cheese, etc.  - Aim to increase protein intake during breakfast.   Teaching Method Utilized:  Visual Auditory Hands on  Barriers to learning/adherence to lifestyle change: none  Demonstrated degree of understanding via:  Teach Back   Monitoring/Evaluation:  Dietary intake, exercise, lap band fills, and body weight. Follow up in 1 month for 5 month post-op visit.

## 2016-12-19 DIAGNOSIS — M545 Low back pain: Secondary | ICD-10-CM | POA: Diagnosis not present

## 2016-12-19 DIAGNOSIS — R2689 Other abnormalities of gait and mobility: Secondary | ICD-10-CM | POA: Diagnosis not present

## 2016-12-23 ENCOUNTER — Ambulatory Visit: Payer: Medicare HMO | Admitting: Psychiatry

## 2016-12-24 DIAGNOSIS — M545 Low back pain: Secondary | ICD-10-CM | POA: Diagnosis not present

## 2016-12-24 DIAGNOSIS — M542 Cervicalgia: Secondary | ICD-10-CM | POA: Diagnosis not present

## 2016-12-24 DIAGNOSIS — R2689 Other abnormalities of gait and mobility: Secondary | ICD-10-CM | POA: Diagnosis not present

## 2016-12-31 DIAGNOSIS — R2689 Other abnormalities of gait and mobility: Secondary | ICD-10-CM | POA: Diagnosis not present

## 2016-12-31 DIAGNOSIS — M545 Low back pain: Secondary | ICD-10-CM | POA: Diagnosis not present

## 2017-01-02 DIAGNOSIS — R2689 Other abnormalities of gait and mobility: Secondary | ICD-10-CM | POA: Diagnosis not present

## 2017-01-02 DIAGNOSIS — M545 Low back pain: Secondary | ICD-10-CM | POA: Diagnosis not present

## 2017-01-05 DIAGNOSIS — M545 Low back pain: Secondary | ICD-10-CM | POA: Diagnosis not present

## 2017-01-05 DIAGNOSIS — R2689 Other abnormalities of gait and mobility: Secondary | ICD-10-CM | POA: Diagnosis not present

## 2017-01-06 ENCOUNTER — Ambulatory Visit (INDEPENDENT_AMBULATORY_CARE_PROVIDER_SITE_OTHER): Payer: Medicare HMO | Admitting: Psychiatry

## 2017-01-06 DIAGNOSIS — F509 Eating disorder, unspecified: Secondary | ICD-10-CM | POA: Diagnosis not present

## 2017-01-06 DIAGNOSIS — R69 Illness, unspecified: Secondary | ICD-10-CM | POA: Diagnosis not present

## 2017-01-06 DIAGNOSIS — E119 Type 2 diabetes mellitus without complications: Secondary | ICD-10-CM | POA: Diagnosis not present

## 2017-01-06 DIAGNOSIS — F4323 Adjustment disorder with mixed anxiety and depressed mood: Secondary | ICD-10-CM

## 2017-01-09 DIAGNOSIS — R2689 Other abnormalities of gait and mobility: Secondary | ICD-10-CM | POA: Diagnosis not present

## 2017-01-09 DIAGNOSIS — M545 Low back pain: Secondary | ICD-10-CM | POA: Diagnosis not present

## 2017-01-20 ENCOUNTER — Ambulatory Visit (INDEPENDENT_AMBULATORY_CARE_PROVIDER_SITE_OTHER): Payer: Medicare HMO | Admitting: Psychiatry

## 2017-01-20 DIAGNOSIS — R69 Illness, unspecified: Secondary | ICD-10-CM | POA: Diagnosis not present

## 2017-01-20 DIAGNOSIS — F509 Eating disorder, unspecified: Secondary | ICD-10-CM | POA: Diagnosis not present

## 2017-01-20 DIAGNOSIS — F4323 Adjustment disorder with mixed anxiety and depressed mood: Secondary | ICD-10-CM

## 2017-01-21 ENCOUNTER — Ambulatory Visit: Payer: Self-pay | Admitting: Registered"

## 2017-01-22 ENCOUNTER — Ambulatory Visit: Payer: Self-pay | Admitting: Registered"

## 2017-01-22 DIAGNOSIS — N182 Chronic kidney disease, stage 2 (mild): Secondary | ICD-10-CM | POA: Diagnosis not present

## 2017-01-22 DIAGNOSIS — N183 Chronic kidney disease, stage 3 (moderate): Secondary | ICD-10-CM | POA: Diagnosis not present

## 2017-01-22 DIAGNOSIS — I129 Hypertensive chronic kidney disease with stage 1 through stage 4 chronic kidney disease, or unspecified chronic kidney disease: Secondary | ICD-10-CM | POA: Diagnosis not present

## 2017-01-22 DIAGNOSIS — E1122 Type 2 diabetes mellitus with diabetic chronic kidney disease: Secondary | ICD-10-CM | POA: Diagnosis not present

## 2017-01-22 DIAGNOSIS — N08 Glomerular disorders in diseases classified elsewhere: Secondary | ICD-10-CM | POA: Diagnosis not present

## 2017-01-22 DIAGNOSIS — Z79899 Other long term (current) drug therapy: Secondary | ICD-10-CM | POA: Diagnosis not present

## 2017-01-26 DIAGNOSIS — M545 Low back pain: Secondary | ICD-10-CM | POA: Diagnosis not present

## 2017-01-26 DIAGNOSIS — M4150 Other secondary scoliosis, site unspecified: Secondary | ICD-10-CM | POA: Diagnosis not present

## 2017-01-26 DIAGNOSIS — M4316 Spondylolisthesis, lumbar region: Secondary | ICD-10-CM | POA: Diagnosis not present

## 2017-01-26 DIAGNOSIS — G8929 Other chronic pain: Secondary | ICD-10-CM | POA: Diagnosis not present

## 2017-01-26 DIAGNOSIS — R2689 Other abnormalities of gait and mobility: Secondary | ICD-10-CM | POA: Diagnosis not present

## 2017-01-28 DIAGNOSIS — M1711 Unilateral primary osteoarthritis, right knee: Secondary | ICD-10-CM | POA: Diagnosis not present

## 2017-01-28 DIAGNOSIS — Z96652 Presence of left artificial knee joint: Secondary | ICD-10-CM | POA: Diagnosis not present

## 2017-01-30 ENCOUNTER — Ambulatory Visit: Payer: Self-pay | Admitting: Registered"

## 2017-01-30 DIAGNOSIS — R69 Illness, unspecified: Secondary | ICD-10-CM | POA: Diagnosis not present

## 2017-01-30 DIAGNOSIS — M545 Low back pain: Secondary | ICD-10-CM | POA: Diagnosis not present

## 2017-01-30 DIAGNOSIS — R2689 Other abnormalities of gait and mobility: Secondary | ICD-10-CM | POA: Diagnosis not present

## 2017-02-03 ENCOUNTER — Ambulatory Visit: Payer: Medicare HMO | Admitting: Psychiatry

## 2017-02-09 DIAGNOSIS — M545 Low back pain: Secondary | ICD-10-CM | POA: Diagnosis not present

## 2017-02-09 DIAGNOSIS — R2689 Other abnormalities of gait and mobility: Secondary | ICD-10-CM | POA: Diagnosis not present

## 2017-02-11 ENCOUNTER — Ambulatory Visit: Payer: Self-pay | Admitting: Registered"

## 2017-02-11 DIAGNOSIS — M545 Low back pain: Secondary | ICD-10-CM | POA: Diagnosis not present

## 2017-02-11 DIAGNOSIS — R2689 Other abnormalities of gait and mobility: Secondary | ICD-10-CM | POA: Diagnosis not present

## 2017-02-19 ENCOUNTER — Ambulatory Visit (INDEPENDENT_AMBULATORY_CARE_PROVIDER_SITE_OTHER): Payer: Medicare HMO | Admitting: Psychiatry

## 2017-02-19 DIAGNOSIS — F4323 Adjustment disorder with mixed anxiety and depressed mood: Secondary | ICD-10-CM | POA: Diagnosis not present

## 2017-02-19 DIAGNOSIS — F509 Eating disorder, unspecified: Secondary | ICD-10-CM

## 2017-02-19 DIAGNOSIS — R69 Illness, unspecified: Secondary | ICD-10-CM | POA: Diagnosis not present

## 2017-02-23 DIAGNOSIS — R2689 Other abnormalities of gait and mobility: Secondary | ICD-10-CM | POA: Diagnosis not present

## 2017-02-23 DIAGNOSIS — M545 Low back pain: Secondary | ICD-10-CM | POA: Diagnosis not present

## 2017-02-27 DIAGNOSIS — R2689 Other abnormalities of gait and mobility: Secondary | ICD-10-CM | POA: Diagnosis not present

## 2017-02-27 DIAGNOSIS — M545 Low back pain: Secondary | ICD-10-CM | POA: Diagnosis not present

## 2017-03-02 DIAGNOSIS — M545 Low back pain: Secondary | ICD-10-CM | POA: Diagnosis not present

## 2017-03-02 DIAGNOSIS — R2689 Other abnormalities of gait and mobility: Secondary | ICD-10-CM | POA: Diagnosis not present

## 2017-03-03 ENCOUNTER — Ambulatory Visit: Payer: Medicare HMO | Admitting: Psychiatry

## 2017-03-04 DIAGNOSIS — M545 Low back pain: Secondary | ICD-10-CM | POA: Diagnosis not present

## 2017-03-04 DIAGNOSIS — R2689 Other abnormalities of gait and mobility: Secondary | ICD-10-CM | POA: Diagnosis not present

## 2017-03-13 DIAGNOSIS — M4150 Other secondary scoliosis, site unspecified: Secondary | ICD-10-CM | POA: Insufficient documentation

## 2017-03-13 DIAGNOSIS — M79671 Pain in right foot: Secondary | ICD-10-CM | POA: Diagnosis not present

## 2017-03-13 DIAGNOSIS — B351 Tinea unguium: Secondary | ICD-10-CM | POA: Diagnosis not present

## 2017-03-13 DIAGNOSIS — M4316 Spondylolisthesis, lumbar region: Secondary | ICD-10-CM | POA: Diagnosis not present

## 2017-03-13 DIAGNOSIS — M79672 Pain in left foot: Secondary | ICD-10-CM | POA: Diagnosis not present

## 2017-03-13 DIAGNOSIS — M4030 Flatback syndrome, site unspecified: Secondary | ICD-10-CM | POA: Insufficient documentation

## 2017-03-13 DIAGNOSIS — M4036 Flatback syndrome, lumbar region: Secondary | ICD-10-CM | POA: Diagnosis not present

## 2017-03-13 DIAGNOSIS — M5136 Other intervertebral disc degeneration, lumbar region: Secondary | ICD-10-CM | POA: Diagnosis not present

## 2017-03-13 DIAGNOSIS — E1151 Type 2 diabetes mellitus with diabetic peripheral angiopathy without gangrene: Secondary | ICD-10-CM | POA: Diagnosis not present

## 2017-03-13 DIAGNOSIS — M4186 Other forms of scoliosis, lumbar region: Secondary | ICD-10-CM | POA: Diagnosis not present

## 2017-03-13 DIAGNOSIS — M4310 Spondylolisthesis, site unspecified: Secondary | ICD-10-CM | POA: Insufficient documentation

## 2017-03-17 ENCOUNTER — Ambulatory Visit (INDEPENDENT_AMBULATORY_CARE_PROVIDER_SITE_OTHER): Payer: Medicare HMO | Admitting: Psychiatry

## 2017-03-17 DIAGNOSIS — F509 Eating disorder, unspecified: Secondary | ICD-10-CM | POA: Diagnosis not present

## 2017-03-17 DIAGNOSIS — H524 Presbyopia: Secondary | ICD-10-CM | POA: Diagnosis not present

## 2017-03-17 DIAGNOSIS — E119 Type 2 diabetes mellitus without complications: Secondary | ICD-10-CM | POA: Diagnosis not present

## 2017-03-17 DIAGNOSIS — R69 Illness, unspecified: Secondary | ICD-10-CM | POA: Diagnosis not present

## 2017-03-17 DIAGNOSIS — F4323 Adjustment disorder with mixed anxiety and depressed mood: Secondary | ICD-10-CM | POA: Diagnosis not present

## 2017-03-17 DIAGNOSIS — H40003 Preglaucoma, unspecified, bilateral: Secondary | ICD-10-CM | POA: Diagnosis not present

## 2017-03-20 DIAGNOSIS — M4036 Flatback syndrome, lumbar region: Secondary | ICD-10-CM | POA: Diagnosis not present

## 2017-03-24 DIAGNOSIS — H25813 Combined forms of age-related cataract, bilateral: Secondary | ICD-10-CM | POA: Diagnosis not present

## 2017-03-27 DIAGNOSIS — M5030 Other cervical disc degeneration, unspecified cervical region: Secondary | ICD-10-CM | POA: Diagnosis not present

## 2017-03-27 DIAGNOSIS — M5136 Other intervertebral disc degeneration, lumbar region: Secondary | ICD-10-CM | POA: Diagnosis not present

## 2017-03-27 DIAGNOSIS — M4186 Other forms of scoliosis, lumbar region: Secondary | ICD-10-CM | POA: Diagnosis not present

## 2017-03-27 DIAGNOSIS — M4316 Spondylolisthesis, lumbar region: Secondary | ICD-10-CM | POA: Diagnosis not present

## 2017-03-27 DIAGNOSIS — M4036 Flatback syndrome, lumbar region: Secondary | ICD-10-CM | POA: Diagnosis not present

## 2017-03-30 DIAGNOSIS — H25811 Combined forms of age-related cataract, right eye: Secondary | ICD-10-CM | POA: Diagnosis not present

## 2017-03-30 DIAGNOSIS — H2511 Age-related nuclear cataract, right eye: Secondary | ICD-10-CM | POA: Diagnosis not present

## 2017-03-30 DIAGNOSIS — E119 Type 2 diabetes mellitus without complications: Secondary | ICD-10-CM | POA: Diagnosis not present

## 2017-03-30 DIAGNOSIS — H524 Presbyopia: Secondary | ICD-10-CM | POA: Diagnosis not present

## 2017-03-30 DIAGNOSIS — H2513 Age-related nuclear cataract, bilateral: Secondary | ICD-10-CM | POA: Diagnosis not present

## 2017-03-30 DIAGNOSIS — H40003 Preglaucoma, unspecified, bilateral: Secondary | ICD-10-CM | POA: Diagnosis not present

## 2017-04-06 DIAGNOSIS — E119 Type 2 diabetes mellitus without complications: Secondary | ICD-10-CM | POA: Diagnosis not present

## 2017-04-07 DIAGNOSIS — M542 Cervicalgia: Secondary | ICD-10-CM | POA: Diagnosis not present

## 2017-04-14 DIAGNOSIS — M5033 Other cervical disc degeneration, cervicothoracic region: Secondary | ICD-10-CM | POA: Diagnosis not present

## 2017-04-14 DIAGNOSIS — M5030 Other cervical disc degeneration, unspecified cervical region: Secondary | ICD-10-CM | POA: Diagnosis not present

## 2017-04-15 DIAGNOSIS — M1711 Unilateral primary osteoarthritis, right knee: Secondary | ICD-10-CM | POA: Diagnosis not present

## 2017-04-21 ENCOUNTER — Ambulatory Visit (INDEPENDENT_AMBULATORY_CARE_PROVIDER_SITE_OTHER): Payer: Medicare HMO | Admitting: Psychiatry

## 2017-04-21 DIAGNOSIS — R69 Illness, unspecified: Secondary | ICD-10-CM | POA: Diagnosis not present

## 2017-04-21 DIAGNOSIS — F4323 Adjustment disorder with mixed anxiety and depressed mood: Secondary | ICD-10-CM

## 2017-04-21 DIAGNOSIS — F509 Eating disorder, unspecified: Secondary | ICD-10-CM | POA: Diagnosis not present

## 2017-04-22 DIAGNOSIS — M1711 Unilateral primary osteoarthritis, right knee: Secondary | ICD-10-CM | POA: Diagnosis not present

## 2017-04-28 DIAGNOSIS — M5412 Radiculopathy, cervical region: Secondary | ICD-10-CM | POA: Insufficient documentation

## 2017-04-29 DIAGNOSIS — E559 Vitamin D deficiency, unspecified: Secondary | ICD-10-CM | POA: Diagnosis not present

## 2017-04-29 DIAGNOSIS — E1122 Type 2 diabetes mellitus with diabetic chronic kidney disease: Secondary | ICD-10-CM | POA: Diagnosis not present

## 2017-04-29 DIAGNOSIS — E039 Hypothyroidism, unspecified: Secondary | ICD-10-CM | POA: Diagnosis not present

## 2017-04-29 DIAGNOSIS — M1711 Unilateral primary osteoarthritis, right knee: Secondary | ICD-10-CM | POA: Diagnosis not present

## 2017-04-29 DIAGNOSIS — N08 Glomerular disorders in diseases classified elsewhere: Secondary | ICD-10-CM | POA: Diagnosis not present

## 2017-04-29 DIAGNOSIS — N183 Chronic kidney disease, stage 3 (moderate): Secondary | ICD-10-CM | POA: Diagnosis not present

## 2017-04-29 DIAGNOSIS — I129 Hypertensive chronic kidney disease with stage 1 through stage 4 chronic kidney disease, or unspecified chronic kidney disease: Secondary | ICD-10-CM | POA: Diagnosis not present

## 2017-04-29 DIAGNOSIS — M25561 Pain in right knee: Secondary | ICD-10-CM | POA: Insufficient documentation

## 2017-05-01 DIAGNOSIS — Z9884 Bariatric surgery status: Secondary | ICD-10-CM | POA: Diagnosis not present

## 2017-05-05 DIAGNOSIS — M5412 Radiculopathy, cervical region: Secondary | ICD-10-CM | POA: Diagnosis not present

## 2017-05-07 ENCOUNTER — Ambulatory Visit: Payer: Medicare HMO | Admitting: Psychiatry

## 2017-05-08 DIAGNOSIS — M5412 Radiculopathy, cervical region: Secondary | ICD-10-CM | POA: Diagnosis not present

## 2017-05-11 DIAGNOSIS — H25812 Combined forms of age-related cataract, left eye: Secondary | ICD-10-CM | POA: Diagnosis not present

## 2017-05-11 DIAGNOSIS — H2513 Age-related nuclear cataract, bilateral: Secondary | ICD-10-CM | POA: Diagnosis not present

## 2017-05-11 DIAGNOSIS — H2512 Age-related nuclear cataract, left eye: Secondary | ICD-10-CM | POA: Diagnosis not present

## 2017-05-18 ENCOUNTER — Telehealth: Payer: Self-pay | Admitting: Registered"

## 2017-05-18 NOTE — Telephone Encounter (Signed)
RD called patient because she has not been to see RD in 5 months. Pt states she has been busy and will make an appointment to come in soon. Pt states she feels good and has been energized, working out 3x/week. Pt states she does not have time to track protein on a daily basis but is eating a lot of protein.

## 2017-05-19 ENCOUNTER — Ambulatory Visit: Payer: Medicare HMO | Admitting: Psychiatry

## 2017-06-02 ENCOUNTER — Ambulatory Visit: Payer: Self-pay | Admitting: Registered"

## 2017-06-03 ENCOUNTER — Ambulatory Visit (INDEPENDENT_AMBULATORY_CARE_PROVIDER_SITE_OTHER): Payer: Medicare HMO | Admitting: Psychiatry

## 2017-06-03 DIAGNOSIS — F4323 Adjustment disorder with mixed anxiety and depressed mood: Secondary | ICD-10-CM | POA: Diagnosis not present

## 2017-06-03 DIAGNOSIS — R69 Illness, unspecified: Secondary | ICD-10-CM | POA: Diagnosis not present

## 2017-06-03 DIAGNOSIS — F509 Eating disorder, unspecified: Secondary | ICD-10-CM | POA: Diagnosis not present

## 2017-06-11 DIAGNOSIS — B351 Tinea unguium: Secondary | ICD-10-CM | POA: Diagnosis not present

## 2017-06-11 DIAGNOSIS — L84 Corns and callosities: Secondary | ICD-10-CM | POA: Diagnosis not present

## 2017-06-11 DIAGNOSIS — E1351 Other specified diabetes mellitus with diabetic peripheral angiopathy without gangrene: Secondary | ICD-10-CM | POA: Diagnosis not present

## 2017-06-17 ENCOUNTER — Ambulatory Visit (INDEPENDENT_AMBULATORY_CARE_PROVIDER_SITE_OTHER): Payer: Medicare HMO | Admitting: Psychiatry

## 2017-06-17 DIAGNOSIS — F4323 Adjustment disorder with mixed anxiety and depressed mood: Secondary | ICD-10-CM

## 2017-06-17 DIAGNOSIS — M1711 Unilateral primary osteoarthritis, right knee: Secondary | ICD-10-CM | POA: Diagnosis not present

## 2017-06-17 DIAGNOSIS — F509 Eating disorder, unspecified: Secondary | ICD-10-CM

## 2017-06-17 DIAGNOSIS — R69 Illness, unspecified: Secondary | ICD-10-CM | POA: Diagnosis not present

## 2017-06-22 DIAGNOSIS — R69 Illness, unspecified: Secondary | ICD-10-CM | POA: Diagnosis not present

## 2017-07-01 ENCOUNTER — Ambulatory Visit (INDEPENDENT_AMBULATORY_CARE_PROVIDER_SITE_OTHER): Payer: Medicare HMO | Admitting: Psychiatry

## 2017-07-01 DIAGNOSIS — F509 Eating disorder, unspecified: Secondary | ICD-10-CM

## 2017-07-01 DIAGNOSIS — F4323 Adjustment disorder with mixed anxiety and depressed mood: Secondary | ICD-10-CM

## 2017-07-01 DIAGNOSIS — R69 Illness, unspecified: Secondary | ICD-10-CM | POA: Diagnosis not present

## 2017-07-04 DIAGNOSIS — E119 Type 2 diabetes mellitus without complications: Secondary | ICD-10-CM | POA: Diagnosis not present

## 2017-07-06 ENCOUNTER — Ambulatory Visit: Payer: Self-pay | Admitting: Orthopedic Surgery

## 2017-07-14 ENCOUNTER — Ambulatory Visit: Payer: Self-pay | Admitting: Orthopedic Surgery

## 2017-07-14 NOTE — H&P (Signed)
TOTAL KNEE ADMISSION H&P  Patient is being admitted for right total knee arthroplasty.  Subjective:  Chief Complaint:right knee pain.  HPI: Teresa Golden, 72 y.o. female, has a history of pain and functional disability in the right knee due to arthritis and has failed non-surgical conservative treatments for greater than 12 weeks to includeNSAID's and/or analgesics, corticosteriod injections, viscosupplementation injections, flexibility and strengthening excercises, use of assistive devices, weight reduction as appropriate and activity modification.  Onset of symptoms was gradual, starting >10 years ago with gradually worsening course since that time. The patient noted no past surgery on the right knee(s).  Patient currently rates pain in the right knee(s) at 10 out of 10 with activity. Patient has night pain, worsening of pain with activity and weight bearing, pain that interferes with activities of daily living, pain with passive range of motion, crepitus and joint swelling.  Patient has evidence of subchondral cysts, subchondral sclerosis, periarticular osteophytes and joint space narrowing by imaging studies. There is no active infection.  Patient Active Problem List   Diagnosis Date Noted  . S/P laparoscopic sleeve gastrectomy July 2018 08/26/2016  . Controlled type 1 diabetes mellitus with complication, with long-term current use of insulin ([place])   . Preop cardiovascular exam 07/30/2016  . Dyspnea on exertion 07/30/2016  . Morbid obesity ([place]) 07/30/2016  . Bilateral lower extremity edema 07/30/2016   Past Medical History:  Diagnosis Date  . Arthritis   . Diabetes mellitus, type II, insulin dependent ([place])    With neurologic complications. Bilateral lower extremity peripheral neuropathy  . Dyspnea    with excertion  . Endometriosis   . Essential hypertension   . Hepatitis C    C dormant  . Hypothyroidism   . Morbid obesity with BMI of 50.0-59.9, adult [facility])     Past Surgical  History:  Procedure Laterality Date  . ABDOMINAL HYSTERECTOMY     uterus  . CHOLECYSTECTOMY    . JOINT REPLACEMENT     Left knee  . LAPAROSCOPIC GASTRIC BANDING     and reversal  . LAPAROSCOPIC GASTRIC SLEEVE RESECTION N/A 08/26/2016   Procedure: LAPAROSCOPIC GASTRIC SLEEVE RESECTION WITH UPPER ENOD;  Surgeon: Johnathan Hausen, MD;  Location: [facility];  Service: General;  Laterality: N/A;  . TONSILLECTOMY    . TRANSTHORACIC ECHOCARDIOGRAM  11/2013   EF 65-70%. Normal diastolic Fxn.  Normal Valves.    Current Outpatient Medications  Medication Sig Dispense Refill Last Dose  . B Complex-C (B-COMPLEX WITH VITAMIN C) tablet Take 1 tablet by mouth daily.   Taking  . carvedilol (COREG CR) 20 MG 24 hr capsule Take 20 mg by mouth daily.    Taking  . Cholecalciferol 5000 units TABS Take 5,000 Units by mouth daily.   Taking  . colchicine 0.6 MG tablet Take 1 tablet (0.6 mg total) by mouth daily. (Patient taking differently: Take 0.6 mg by mouth daily as needed (FOR FLAREUPS). ) 10 tablet 0 Taking  . Cyanocobalamin (VITAMIN B-12) 2500 MCG SUBL Take 2,500 mcg by mouth daily.     . diclofenac sodium (VOLTAREN) 1 % GEL Apply 2 g topically at bedtime. Applied to feet at bedtime for neuropathy pain   Taking  . gabapentin (NEURONTIN) 300 MG capsule Take 300 mg by mouth at bedtime.    Taking  . insulin degludec (TRESIBA FLEXTOUCH) 100 UNIT/ML SOPN FlexTouch Pen Inject 12 Units into the skin daily at 10 pm.    Taking  . levothyroxine (SYNTHROID, LEVOTHROID) 25 MCG tablet  Take 12.5-25 mcg by mouth See admin instructions. Take 0.5 tablet (12 mcg) by mouth on Mondays through Fridays, then take 1 tablet (25 mcg) by mouth on Saturdays and Sundays.   Taking  . losartan (COZAAR) 25 MG tablet Take 25 mg by mouth daily.  1   . Multiple Vitamins-Minerals (HAIR/SKIN/NAILS/BIOTIN PO) Take 1 tablet by mouth daily.     . Multiple Vitamins-Minerals (MULTIVITAMIN WITH MINERALS) tablet Take 1 tablet by mouth daily. Women's 50+    Taking  . naproxen sodium (ANAPROX) 220 MG tablet Take 440 mg by mouth at bedtime.    Taking  . OZEMPIC 0.25 or 0.5 MG/DOSE SOPN Inject 0.5 mg into the skin every Saturday at 6 PM.  0    No current facility-administered medications for this visit.    Allergies  Allergen Reactions  . Meloxicam Other (See Comments)    Fever; muscle aches; "flu-like" symptoms    Social History   Tobacco Use  . Smoking status: Former Research scientist (life sciences)  . Smokeless tobacco: Never Used  . Tobacco comment: quit 20 years ago  Substance Use Topics  . Alcohol use: Yes    Alcohol/week: 0.6 oz    Types: 1 Glasses of wine per week    Comment: occasional    Family History  Problem Relation Age of Onset  . Heart attack Mother   . Heart failure Mother   . Stroke Father      Review of Systems  Constitutional: Negative.   HENT: Negative.   Eyes: Negative.   Respiratory: Negative.   Cardiovascular: Negative.   Gastrointestinal: Negative.   Genitourinary: Negative.   Musculoskeletal: Positive for joint pain.  Skin: Negative.   Neurological: Negative.   Endo/Heme/Allergies: Negative.   Psychiatric/Behavioral: Negative.     Objective:  Physical Exam  Vitals reviewed. Constitutional: She is oriented to person, place, and time. She appears well-developed and well-nourished.  HENT:  Head: Normocephalic and atraumatic.  Eyes: Pupils are equal, round, and reactive to light. Conjunctivae and EOM are normal.  Neck: Normal range of motion. Neck supple.  Cardiovascular: Normal rate, regular rhythm and intact distal pulses.  Respiratory: Effort normal. No respiratory distress.  GI: Soft. She exhibits no distension.  Genitourinary:  Genitourinary Comments: deferred  Musculoskeletal:       Right knee: She exhibits decreased range of motion, swelling, effusion and abnormal alignment. Tenderness found. Medial joint line tenderness noted.  Neurological: She is alert and oriented to person, place, and time. She has  normal reflexes.  Skin: Skin is dry.  Psychiatric: She has a normal mood and affect. Her behavior is normal. Thought content normal.    Vital signs in last 24 hours: @VSRANGES @  Labs:   Estimated body mass index is 39.09 kg/m as calculated from the following:   Height as of 12/17/16: 5\' 6"  (1.676 m).   Weight as of 12/17/16: 109.9 kg (242 lb 3.2 oz).   Imaging Review Plain radiographs demonstrate severe degenerative joint disease of the right knee(s). The overall alignment issignificant varus. The bone quality appears to be adequate for age and reported activity level.   Preoperative templating of the joint replacement has been completed, documented, and submitted to the Operating Room personnel in order to optimize intra-operative equipment management.    Patient's anticipated LOS is less than 2 midnights, meeting these requirements: - Younger than 64 - Lives within 1 hour of care - Has a competent adult at home to recover with post-op recover - NO history of  -  Chronic pain requiring opiods  - Diabetes  - Coronary Artery Disease  - Heart failure  - Heart attack  - Stroke  - DVT/VTE  - Cardiac arrhythmia  - Respiratory Failure/COPD  - Renal failure  - Anemia  - Advanced Liver disease        Assessment/Plan:  End stage arthritis, right knee   The patient history, physical examination, clinical judgment of the provider and imaging studies are consistent with end stage degenerative joint disease of the right knee(s) and total knee arthroplasty is deemed medically necessary. The treatment options including medical management, injection therapy arthroscopy and arthroplasty were discussed at length. The risks and benefits of total knee arthroplasty were presented and reviewed. The risks due to aseptic loosening, infection, stiffness, patella tracking problems, thromboembolic complications and other imponderables were discussed. The patient acknowledged the explanation,  agreed to proceed with the plan and consent was signed. Patient is being admitted for inpatient treatment for surgery, pain control, PT, OT, prophylactic antibiotics, VTE prophylaxis, progressive ambulation and ADL's and discharge planning. The patient is planning to be discharged home with outpatient PT

## 2017-07-14 NOTE — H&P (View-Only) (Signed)
TOTAL KNEE ADMISSION H&P  Patient is being admitted for right total knee arthroplasty.  Subjective:  Chief Complaint:right knee pain.  HPI: Teresa Golden, 72 y.o. female, has a history of pain and functional disability in the right knee due to arthritis and has failed non-surgical conservative treatments for greater than 12 weeks to includeNSAID's and/or analgesics, corticosteriod injections, viscosupplementation injections, flexibility and strengthening excercises, use of assistive devices, weight reduction as appropriate and activity modification.  Onset of symptoms was gradual, starting >10 years ago with gradually worsening course since that time. The patient noted no past surgery on the right knee(s).  Patient currently rates pain in the right knee(s) at 10 out of 10 with activity. Patient has night pain, worsening of pain with activity and weight bearing, pain that interferes with activities of daily living, pain with passive range of motion, crepitus and joint swelling.  Patient has evidence of subchondral cysts, subchondral sclerosis, periarticular osteophytes and joint space narrowing by imaging studies. There is no active infection.  Patient Active Problem List   Diagnosis Date Noted  . S/P laparoscopic sleeve gastrectomy July 2018 08/26/2016  . Controlled type 1 diabetes mellitus with complication, with long-term current use of insulin ([place])   . Preop cardiovascular exam 07/30/2016  . Dyspnea on exertion 07/30/2016  . Morbid obesity ([place]) 07/30/2016  . Bilateral lower extremity edema 07/30/2016   Past Medical History:  Diagnosis Date  . Arthritis   . Diabetes mellitus, type II, insulin dependent ([place])    With neurologic complications. Bilateral lower extremity peripheral neuropathy  . Dyspnea    with excertion  . Endometriosis   . Essential hypertension   . Hepatitis C    C dormant  . Hypothyroidism   . Morbid obesity with BMI of 50.0-59.9, adult [facility])     Past Surgical  History:  Procedure Laterality Date  . ABDOMINAL HYSTERECTOMY     uterus  . CHOLECYSTECTOMY    . JOINT REPLACEMENT     Left knee  . LAPAROSCOPIC GASTRIC BANDING     and reversal  . LAPAROSCOPIC GASTRIC SLEEVE RESECTION N/A 08/26/2016   Procedure: LAPAROSCOPIC GASTRIC SLEEVE RESECTION WITH UPPER ENOD;  Surgeon: Johnathan Hausen, MD;  Location: [facility];  Service: General;  Laterality: N/A;  . TONSILLECTOMY    . TRANSTHORACIC ECHOCARDIOGRAM  11/2013   EF 65-70%. Normal diastolic Fxn.  Normal Valves.    Current Outpatient Medications  Medication Sig Dispense Refill Last Dose  . B Complex-C (B-COMPLEX WITH VITAMIN C) tablet Take 1 tablet by mouth daily.   Taking  . carvedilol (COREG CR) 20 MG 24 hr capsule Take 20 mg by mouth daily.    Taking  . Cholecalciferol 5000 units TABS Take 5,000 Units by mouth daily.   Taking  . colchicine 0.6 MG tablet Take 1 tablet (0.6 mg total) by mouth daily. (Patient taking differently: Take 0.6 mg by mouth daily as needed (FOR FLAREUPS). ) 10 tablet 0 Taking  . Cyanocobalamin (VITAMIN B-12) 2500 MCG SUBL Take 2,500 mcg by mouth daily.     . diclofenac sodium (VOLTAREN) 1 % GEL Apply 2 g topically at bedtime. Applied to feet at bedtime for neuropathy pain   Taking  . gabapentin (NEURONTIN) 300 MG capsule Take 300 mg by mouth at bedtime.    Taking  . insulin degludec (TRESIBA FLEXTOUCH) 100 UNIT/ML SOPN FlexTouch Pen Inject 12 Units into the skin daily at 10 pm.    Taking  . levothyroxine (SYNTHROID, LEVOTHROID) 25 MCG tablet  Take 12.5-25 mcg by mouth See admin instructions. Take 0.5 tablet (12 mcg) by mouth on Mondays through Fridays, then take 1 tablet (25 mcg) by mouth on Saturdays and Sundays.   Taking  . losartan (COZAAR) 25 MG tablet Take 25 mg by mouth daily.  1   . Multiple Vitamins-Minerals (HAIR/SKIN/NAILS/BIOTIN PO) Take 1 tablet by mouth daily.     . Multiple Vitamins-Minerals (MULTIVITAMIN WITH MINERALS) tablet Take 1 tablet by mouth daily. Women's 50+    Taking  . naproxen sodium (ANAPROX) 220 MG tablet Take 440 mg by mouth at bedtime.    Taking  . OZEMPIC 0.25 or 0.5 MG/DOSE SOPN Inject 0.5 mg into the skin every Saturday at 6 PM.  0    No current facility-administered medications for this visit.    Allergies  Allergen Reactions  . Meloxicam Other (See Comments)    Fever; muscle aches; "flu-like" symptoms    Social History   Tobacco Use  . Smoking status: Former Research scientist (life sciences)  . Smokeless tobacco: Never Used  . Tobacco comment: quit 20 years ago  Substance Use Topics  . Alcohol use: Yes    Alcohol/week: 0.6 oz    Types: 1 Glasses of wine per week    Comment: occasional    Family History  Problem Relation Age of Onset  . Heart attack Mother   . Heart failure Mother   . Stroke Father      Review of Systems  Constitutional: Negative.   HENT: Negative.   Eyes: Negative.   Respiratory: Negative.   Cardiovascular: Negative.   Gastrointestinal: Negative.   Genitourinary: Negative.   Musculoskeletal: Positive for joint pain.  Skin: Negative.   Neurological: Negative.   Endo/Heme/Allergies: Negative.   Psychiatric/Behavioral: Negative.     Objective:  Physical Exam  Vitals reviewed. Constitutional: She is oriented to person, place, and time. She appears well-developed and well-nourished.  HENT:  Head: Normocephalic and atraumatic.  Eyes: Pupils are equal, round, and reactive to light. Conjunctivae and EOM are normal.  Neck: Normal range of motion. Neck supple.  Cardiovascular: Normal rate, regular rhythm and intact distal pulses.  Respiratory: Effort normal. No respiratory distress.  GI: Soft. She exhibits no distension.  Genitourinary:  Genitourinary Comments: deferred  Musculoskeletal:       Right knee: She exhibits decreased range of motion, swelling, effusion and abnormal alignment. Tenderness found. Medial joint line tenderness noted.  Neurological: She is alert and oriented to person, place, and time. She has  normal reflexes.  Skin: Skin is dry.  Psychiatric: She has a normal mood and affect. Her behavior is normal. Thought content normal.    Vital signs in last 24 hours: @VSRANGES @  Labs:   Estimated body mass index is 39.09 kg/m as calculated from the following:   Height as of 12/17/16: 5\' 6"  (1.676 m).   Weight as of 12/17/16: 109.9 kg (242 lb 3.2 oz).   Imaging Review Plain radiographs demonstrate severe degenerative joint disease of the right knee(s). The overall alignment issignificant varus. The bone quality appears to be adequate for age and reported activity level.   Preoperative templating of the joint replacement has been completed, documented, and submitted to the Operating Room personnel in order to optimize intra-operative equipment management.    Patient's anticipated LOS is less than 2 midnights, meeting these requirements: - Younger than 103 - Lives within 1 hour of care - Has a competent adult at home to recover with post-op recover - NO history of  -  Chronic pain requiring opiods  - Diabetes  - Coronary Artery Disease  - Heart failure  - Heart attack  - Stroke  - DVT/VTE  - Cardiac arrhythmia  - Respiratory Failure/COPD  - Renal failure  - Anemia  - Advanced Liver disease        Assessment/Plan:  End stage arthritis, right knee   The patient history, physical examination, clinical judgment of the provider and imaging studies are consistent with end stage degenerative joint disease of the right knee(s) and total knee arthroplasty is deemed medically necessary. The treatment options including medical management, injection therapy arthroscopy and arthroplasty were discussed at length. The risks and benefits of total knee arthroplasty were presented and reviewed. The risks due to aseptic loosening, infection, stiffness, patella tracking problems, thromboembolic complications and other imponderables were discussed. The patient acknowledged the explanation,  agreed to proceed with the plan and consent was signed. Patient is being admitted for inpatient treatment for surgery, pain control, PT, OT, prophylactic antibiotics, VTE prophylaxis, progressive ambulation and ADL's and discharge planning. The patient is planning to be discharged home with outpatient PT

## 2017-07-15 ENCOUNTER — Other Ambulatory Visit (HOSPITAL_COMMUNITY): Payer: Self-pay | Admitting: Emergency Medicine

## 2017-07-15 ENCOUNTER — Ambulatory Visit (INDEPENDENT_AMBULATORY_CARE_PROVIDER_SITE_OTHER): Payer: Medicare HMO | Admitting: Psychiatry

## 2017-07-15 DIAGNOSIS — F509 Eating disorder, unspecified: Secondary | ICD-10-CM | POA: Diagnosis not present

## 2017-07-15 DIAGNOSIS — F4323 Adjustment disorder with mixed anxiety and depressed mood: Secondary | ICD-10-CM | POA: Diagnosis not present

## 2017-07-15 DIAGNOSIS — R69 Illness, unspecified: Secondary | ICD-10-CM | POA: Diagnosis not present

## 2017-07-15 NOTE — Progress Notes (Signed)
CLEARANCE DR. Bailey Mech SANDERS 06-24-17 ON CHART   LOV CARDIOLOGY DR HARDING 11-21-16 Epic   EKG 11-21-16 Epic   STRESS TEST 08-06-16 Epic  EKG 07-01-16 ON CHART   LABS ON CHART from [facility] 04-29-17

## 2017-07-15 NOTE — Patient Instructions (Addendum)
JOCI DRESS  07/15/2017   Your procedure is scheduled on: 07-30-17   Report to [facility] Main  Entrance    Report to admitting at 5:30AM    Call this number if you have problems the morning of surgery [phone]     Remember: Do not eat food or drink liquids :After Midnight.     Take these medicines the morning of surgery with A SIP OF WATER: CARVEDILOL, LEVOTHYROXINE                                 You may not have any metal on your body including hair pins and              piercings  Do not wear jewelry, make-up, lotions, powders or perfumes, deodorant             Do not wear nail polish.  Do not shave  48 hours prior to surgery.               Do not bring valuables to the hospital. [place].  Contacts, dentures or bridgework may not be worn into surgery.  Leave suitcase in the car. After surgery it may be brought to your room.                 Please read over the following fact sheets you were given: _____________________________________________________________________            How to Manage Your Diabetes Before and After Surgery  Why is it important to control my blood sugar before and after surgery? . Improving blood sugar levels before and after surgery helps healing and can limit problems. . A way of improving blood sugar control is eating a healthy diet by: o  Eating less sugar and carbohydrates o  Increasing activity/exercise o  Talking with your doctor about reaching your blood sugar goals . High blood sugars (greater than 180 mg/dL) can raise your risk of infections and slow your recovery, so you will need to focus on controlling your diabetes during the weeks before surgery. . Make sure that the doctor who takes care of your diabetes knows about your planned surgery including the date and location.  How do I manage my blood sugar before surgery? . Check your blood sugar at least  4 times a day, starting 2 days before surgery, to make sure that the level is not too high or low. o Check your blood sugar the morning of your surgery when you wake up and every 2 hours until you get to the Short Stay unit. . If your blood sugar is less than 70 mg/dL, you will need to treat for low blood sugar: o Do not take insulin. o Treat a low blood sugar (less than 70 mg/dL) with  cup of clear juice (cranberry or apple), 4 glucose tablets, OR glucose gel. o Recheck blood sugar in 15 minutes after treatment (to make sure it is greater than 70 mg/dL). If your blood sugar is not greater than 70 mg/dL on recheck, call [phone] for further instructions. . Report your blood sugar to the short stay nurse when you get to Short Stay.  . If you are admitted to the hospital after  surgery: o Your blood sugar will be checked by the staff and you will probably be given insulin after surgery (instead of oral diabetes medicines) to make sure you have good blood sugar levels. o The goal for blood sugar control after surgery is 80-180 mg/dL.   WHAT DO I DO ABOUT MY DIABETES MEDICATION?   . THE NIGHT BEFORE SURGERY, take 1/2 dose of  TRESIBA insulin      . THE MORNING OF SURGERY, take 1/2 dose of  TRESIBA insulin    Patient Signature:  Date:   Nurse Signature:  Date:   Reviewed and Endorsed by [facility] Patient Education Committee, August 2015    [facility] - Preparing for Surgery Before surgery, you can play an important role.  Because skin is not sterile, your skin needs to be as free of germs as possible.  You can reduce the number of germs on your skin by washing with CHG (chlorahexidine gluconate) soap before surgery.  CHG is an antiseptic cleaner which kills germs and bonds with the skin to continue killing germs even after washing. Please DO NOT use if you have an allergy to CHG or antibacterial soaps.  If your skin becomes reddened/irritated stop using the CHG and inform your nurse  when you arrive at Short Stay. Do not shave (including legs and underarms) for at least 48 hours prior to the first CHG shower.  You may shave your face/neck. Please follow these instructions carefully:  1.  Shower with CHG Soap the night before surgery and the  morning of Surgery.  2.  If you choose to wash your hair, wash your hair first as usual with your  normal  shampoo.  3.  After you shampoo, rinse your hair and body thoroughly to remove the  shampoo.                           4.  Use CHG as you would any other liquid soap.  You can apply chg directly  to the skin and wash                       Gently with a scrungie or clean washcloth.  5.  Apply the CHG Soap to your body ONLY FROM THE NECK DOWN.   Do not use on face/ open                           Wound or open sores. Avoid contact with eyes, ears mouth and genitals (private parts).                       Wash face,  Genitals (private parts) with your normal soap.             6.  Wash thoroughly, paying special attention to the area where your surgery  will be performed.  7.  Thoroughly rinse your body with warm water from the neck down.  8.  DO NOT shower/wash with your normal soap after using and rinsing off  the CHG Soap.                9.  Pat yourself dry with a clean towel.            10.  Wear clean pajamas.            11.  Place clean sheets on  your bed the night of your first shower and do not  sleep with pets. Day of Surgery : Do not apply any lotions/deodorants the morning of surgery.  Please wear clean clothes to the hospital/surgery center.  FAILURE TO FOLLOW THESE INSTRUCTIONS MAY RESULT IN THE CANCELLATION OF YOUR SURGERY PATIENT SIGNATURE_________________________________  NURSE SIGNATURE__________________________________  ________________________________________________________________________   Adam Phenix  An incentive spirometer is a tool that can help keep your lungs clear and active. This tool  measures how well you are filling your lungs with each breath. Taking long deep breaths may help reverse or decrease the chance of developing breathing (pulmonary) problems (especially infection) following:  A long period of time when you are unable to move or be active. BEFORE THE PROCEDURE   If the spirometer includes an indicator to show your best effort, your nurse or respiratory therapist will set it to a desired goal.  If possible, sit up straight or lean slightly forward. Try not to slouch.  Hold the incentive spirometer in an upright position. INSTRUCTIONS FOR USE  1. Sit on the edge of your bed if possible, or sit up as far as you can in bed or on a chair. 2. Hold the incentive spirometer in an upright position. 3. Breathe out normally. 4. Place the mouthpiece in your mouth and seal your lips tightly around it. 5. Breathe in slowly and as deeply as possible, raising the piston or the ball toward the top of the column. 6. Hold your breath for 3-5 seconds or for as long as possible. Allow the piston or ball to fall to the bottom of the column. 7. Remove the mouthpiece from your mouth and breathe out normally. 8. Rest for a few seconds and repeat Steps 1 through 7 at least 10 times every 1-2 hours when you are awake. Take your time and take a few normal breaths between deep breaths. 9. The spirometer may include an indicator to show your best effort. Use the indicator as a goal to work toward during each repetition. 10. After each set of 10 deep breaths, practice coughing to be sure your lungs are clear. If you have an incision (the cut made at the time of surgery), support your incision when coughing by placing a pillow or rolled up towels firmly against it. Once you are able to get out of bed, walk around indoors and cough well. You may stop using the incentive spirometer when instructed by your caregiver.  RISKS AND COMPLICATIONS  Take your time so you do not get dizzy or  light-headed.  If you are in pain, you may need to take or ask for pain medication before doing incentive spirometry. It is harder to take a deep breath if you are having pain. AFTER USE  Rest and breathe slowly and easily.  It can be helpful to keep track of a log of your progress. Your caregiver can provide you with a simple table to help with this. If you are using the spirometer at home, follow these instructions: [place] IF:   You are having difficultly using the spirometer.  You have trouble using the spirometer as often as instructed.  Your pain medication is not giving enough relief while using the spirometer.  You develop fever of 100.5 F (38.1 C) or higher. SEEK IMMEDIATE MEDICAL CARE IF:   You cough up bloody sputum that had not been present before.  You develop fever of 102 F (38.9 C) or greater.  You develop worsening  pain at or near the incision site. MAKE SURE YOU:   Understand these instructions.  Will watch your condition.  Will get help right away if you are not doing well or get worse. Document Released: 06/23/2006 Document Revised: 05/05/2011 Document Reviewed: 08/24/2006 Exit[employer] Patient Information 2014 Exit[employer], [place].   ________________________________________________________________________  WHAT IS A BLOOD TRANSFUSION? Blood Transfusion Information  A transfusion is the replacement of blood or some of its parts. Blood is made up of multiple cells which provide different functions.  Red blood cells carry oxygen and are used for blood loss replacement.  Recore blood cells fight against infection.  Platelets control bleeding.  Plasma helps clot blood.  Other blood products are available for specialized needs, such as hemophilia or other clotting disorders. BEFORE THE TRANSFUSION  Who gives blood for transfusions?   Healthy volunteers who are fully evaluated to make sure their blood is safe. This is blood bank  blood. Transfusion therapy is the safest it has ever been in the practice of medicine. Before blood is taken from a donor, a complete history is taken to make sure that person has no history of diseases nor engages in risky social behavior (examples are intravenous drug use or sexual activity with multiple partners). The donor's travel history is screened to minimize risk of transmitting infections, such as malaria. The donated blood is tested for signs of infectious diseases, such as HIV and hepatitis. The blood is then tested to be sure it is compatible with you in order to minimize the chance of a transfusion reaction. If you or a relative donates blood, this is often done in anticipation of surgery and is not appropriate for emergency situations. It takes many days to process the donated blood. RISKS AND COMPLICATIONS Although transfusion therapy is very safe and saves many lives, the main dangers of transfusion include:   Getting an infectious disease.  Developing a transfusion reaction. This is an allergic reaction to something in the blood you were given. Every precaution is taken to prevent this. The decision to have a blood transfusion has been considered carefully by your caregiver before blood is given. Blood is not given unless the benefits outweigh the risks. AFTER THE TRANSFUSION  Right after receiving a blood transfusion, you will usually feel much better and more energetic. This is especially true if your red blood cells have gotten low (anemic). The transfusion raises the level of the red blood cells which carry oxygen, and this usually causes an energy increase.  The nurse administering the transfusion will monitor you carefully for complications. HOME CARE INSTRUCTIONS  No special instructions are needed after a transfusion. You may find your energy is better. Speak with your caregiver about any limitations on activity for underlying diseases you may have. SEEK MEDICAL CARE IF:    Your condition is not improving after your transfusion.  You develop redness or irritation at the intravenous (IV) site. SEEK IMMEDIATE MEDICAL CARE IF:  Any of the following symptoms occur over the next 12 hours:  Shaking chills.  You have a temperature by mouth above 102 F (38.9 C), not controlled by medicine.  Chest, back, or muscle pain.  People around you feel you are not acting correctly or are confused.  Shortness of breath or difficulty breathing.  Dizziness and fainting.  You get a rash or develop hives.  You have a decrease in urine output.  Your urine turns a dark color or changes to pink, red, or brown. Any of the following  symptoms occur over the next 10 days:  You have a temperature by mouth above 102 F (38.9 C), not controlled by medicine.  Shortness of breath.  Weakness after normal activity.  The [place] part of the eye turns yellow (jaundice).  You have a decrease in the amount of urine or are urinating less often.  Your urine turns a dark color or changes to pink, red, or brown. Document Released: 02/08/2000 Document Revised: 05/05/2011 Document Reviewed: 09/27/2007 [facility] Patient Information 2014 [place], [place].  _______________________________________________________________________

## 2017-07-16 ENCOUNTER — Encounter (HOSPITAL_COMMUNITY)
Admission: RE | Admit: 2017-07-16 | Discharge: 2017-07-16 | Disposition: A | Discharge status: Home / Self Care | Payer: Medicare HMO | Source: Ambulatory Visit | Attending: Orthopedic Surgery | Admitting: Orthopedic Surgery

## 2017-07-16 ENCOUNTER — Encounter (HOSPITAL_COMMUNITY): Payer: Self-pay

## 2017-07-16 ENCOUNTER — Other Ambulatory Visit: Payer: Self-pay

## 2017-07-16 DIAGNOSIS — M1711 Unilateral primary osteoarthritis, right knee: Secondary | ICD-10-CM | POA: Diagnosis not present

## 2017-07-16 DIAGNOSIS — I10 Essential (primary) hypertension: Secondary | ICD-10-CM | POA: Diagnosis not present

## 2017-07-16 DIAGNOSIS — E1122 Type 2 diabetes mellitus with diabetic chronic kidney disease: Secondary | ICD-10-CM | POA: Diagnosis not present

## 2017-07-16 DIAGNOSIS — Z01812 Encounter for preprocedural laboratory examination: Secondary | ICD-10-CM | POA: Diagnosis not present

## 2017-07-16 DIAGNOSIS — N809 Endometriosis, unspecified: Secondary | ICD-10-CM | POA: Diagnosis not present

## 2017-07-16 DIAGNOSIS — N183 Chronic kidney disease, stage 3 (moderate): Secondary | ICD-10-CM | POA: Insufficient documentation

## 2017-07-16 HISTORY — DX: Scoliosis, unspecified: M41.9

## 2017-07-16 HISTORY — DX: Chronic kidney disease, unspecified: N18.9

## 2017-07-16 HISTORY — DX: Gastro-esophageal reflux disease without esophagitis: K21.9

## 2017-07-16 LAB — COMPREHENSIVE METABOLIC PANEL
ALK PHOS: 71 U/L (ref 38–126)
ALT: 25 U/L (ref 14–54)
AST: 34 U/L (ref 15–41)
Albumin: 3.9 g/dL (ref 3.5–5.0)
Anion gap: 10 (ref 5–15)
BILIRUBIN TOTAL: 0.9 mg/dL (ref 0.3–1.2)
BUN: 18 mg/dL (ref 6–20)
CHLORIDE: 108 mmol/L (ref 101–111)
CO2: 24 mmol/L (ref 22–32)
Calcium: 9.7 mg/dL (ref 8.9–10.3)
Creatinine, Ser: 0.99 mg/dL (ref 0.44–1.00)
GFR calc Af Amer: >60 mL/min (ref >60)
GFR calc non Af Amer: 55 mL/min — ABNORMAL LOW (ref >60)
GLUCOSE: 107 mg/dL — AB (ref 65–99)
POTASSIUM: 3.8 mmol/L (ref 3.5–5.1)
Sodium: 142 mmol/L (ref 135–145)
TOTAL PROTEIN: 7.5 g/dL (ref 6.5–8.1)

## 2017-07-16 LAB — SURGICAL PCR SCREEN
MRSA, PCR: NEGATIVE
Staphylococcus aureus: NEGATIVE

## 2017-07-16 LAB — CBC
HEMATOCRIT: 36.1 % (ref 36.0–46.0)
HEMOGLOBIN: 11.9 g/dL — AB (ref 12.0–15.0)
MCH: 29 pg (ref 26.0–34.0)
MCHC: 33 g/dL (ref 30.0–36.0)
MCV: 88 fL (ref 78.0–100.0)
Platelets: 180 10*3/uL (ref 150–400)
RBC: 4.1 MIL/uL (ref 3.87–5.11)
RDW: 14.4 % (ref 11.5–15.5)
WBC: 8 10*3/uL (ref 4.0–10.5)

## 2017-07-16 LAB — HEMOGLOBIN A1C
HEMOGLOBIN A1C: 5.9 % — AB (ref 4.8–5.6)
MEAN PLASMA GLUCOSE: 122.63 mg/dL

## 2017-07-16 LAB — GLUCOSE, CAPILLARY: GLUCOSE-CAPILLARY: 114 mg/dL — AB (ref 65–99)

## 2017-07-29 ENCOUNTER — Ambulatory Visit: Payer: Medicare HMO | Admitting: Psychiatry

## 2017-07-29 MED ORDER — TRANEXAMIC ACID 1000 MG/10ML IV SOLN
1000.0000 mg | INTRAVENOUS | Status: AC
  Administered 2017-07-30: 1000 mg via INTRAVENOUS
  Filled 2017-07-29: qty 1100

## 2017-07-29 NOTE — Anesthesia Preprocedure Evaluation (Addendum)
Anesthesia Evaluation  Patient identified by MRN, date of birth, ID band Patient awake    Reviewed: Allergy & Precautions, NPO status , Patient's Chart, lab work & pertinent test results, reviewed documented beta blocker date and time   Airway Mallampati: II  TM Distance: >3 FB Neck ROM: Full    Dental  (+) Dental Advisory Given, Edentulous Upper, Partial Lower   Pulmonary former smoker,    Pulmonary exam normal breath sounds clear to auscultation       Cardiovascular Exercise Tolerance: Good hypertension, Pt. on medications and Pt. on home beta blockers (-) anginaNormal cardiovascular exam Rhythm:Regular Rate:Normal     Neuro/Psych negative neurological ROS  negative psych ROS   GI/Hepatic GERD  Medicated and Controlled,(+) Hepatitis -, CS/p gastric sleeve   Endo/Other  diabetes, Type 2, Insulin DependentHypothyroidism Morbid obesity  Renal/GU CRFRenal disease     Musculoskeletal  (+) Arthritis , Scoliosis   Abdominal (+) + obese,   Peds  Hematology negative hematology ROS (+)   Anesthesia Other Findings   Reproductive/Obstetrics                            Anesthesia Physical  Anesthesia Plan  ASA: III  Anesthesia Plan: Spinal   Post-op Pain Management:    Induction:   PONV Risk Score and Plan: 3 and Treatment may vary due to age or medical condition, Ondansetron and Propofol infusion  Airway Management Planned: Natural Airway and Simple Face Mask  Additional Equipment: None  Intra-op Plan:   Post-operative Plan:   Informed Consent: I have reviewed the patients History and Physical, chart, labs and discussed the procedure including the risks, benefits and alternatives for the proposed anesthesia with the patient or authorized representative who has indicated his/her understanding and acceptance.     Plan Discussed with: CRNA and Anesthesiologist  Anesthesia Plan  Comments:         Anesthesia Quick Evaluation

## 2017-07-30 ENCOUNTER — Other Ambulatory Visit: Payer: Self-pay

## 2017-07-30 ENCOUNTER — Inpatient Hospital Stay (HOSPITAL_COMMUNITY): Payer: Medicare HMO

## 2017-07-30 ENCOUNTER — Encounter (HOSPITAL_COMMUNITY): Payer: Self-pay | Admitting: Emergency Medicine

## 2017-07-30 ENCOUNTER — Inpatient Hospital Stay (HOSPITAL_COMMUNITY)
Admission: RE | Admit: 2017-07-30 | Discharge: 2017-08-01 | DRG: 470 | Disposition: A | Discharge status: Home / Self Care | Payer: Medicare HMO | Source: Ambulatory Visit | Attending: Orthopedic Surgery | Admitting: Orthopedic Surgery

## 2017-07-30 ENCOUNTER — Inpatient Hospital Stay (HOSPITAL_COMMUNITY): Payer: Medicare HMO | Admitting: Anesthesiology

## 2017-07-30 ENCOUNTER — Encounter (HOSPITAL_COMMUNITY)
Admission: RE | Disposition: A | Discharge status: Home / Self Care | Payer: Self-pay | Source: Ambulatory Visit | Attending: Orthopedic Surgery

## 2017-07-30 DIAGNOSIS — N183 Chronic kidney disease, stage 3 (moderate): Secondary | ICD-10-CM | POA: Diagnosis present

## 2017-07-30 DIAGNOSIS — Z79899 Other long term (current) drug therapy: Secondary | ICD-10-CM | POA: Diagnosis not present

## 2017-07-30 DIAGNOSIS — Z794 Long term (current) use of insulin: Secondary | ICD-10-CM

## 2017-07-30 DIAGNOSIS — E1122 Type 2 diabetes mellitus with diabetic chronic kidney disease: Secondary | ICD-10-CM | POA: Diagnosis not present

## 2017-07-30 DIAGNOSIS — Z6838 Body mass index (BMI) 38.0-38.9, adult: Secondary | ICD-10-CM

## 2017-07-30 DIAGNOSIS — Z8619 Personal history of other infectious and parasitic diseases: Secondary | ICD-10-CM

## 2017-07-30 DIAGNOSIS — K219 Gastro-esophageal reflux disease without esophagitis: Secondary | ICD-10-CM | POA: Diagnosis not present

## 2017-07-30 DIAGNOSIS — Z87891 Personal history of nicotine dependence: Secondary | ICD-10-CM | POA: Diagnosis not present

## 2017-07-30 DIAGNOSIS — E039 Hypothyroidism, unspecified: Secondary | ICD-10-CM | POA: Diagnosis present

## 2017-07-30 DIAGNOSIS — Z96652 Presence of left artificial knee joint: Secondary | ICD-10-CM

## 2017-07-30 DIAGNOSIS — M1711 Unilateral primary osteoarthritis, right knee: Secondary | ICD-10-CM | POA: Diagnosis not present

## 2017-07-30 DIAGNOSIS — R269 Unspecified abnormalities of gait and mobility: Secondary | ICD-10-CM | POA: Diagnosis not present

## 2017-07-30 DIAGNOSIS — I129 Hypertensive chronic kidney disease with stage 1 through stage 4 chronic kidney disease, or unspecified chronic kidney disease: Secondary | ICD-10-CM | POA: Diagnosis not present

## 2017-07-30 DIAGNOSIS — Z96651 Presence of right artificial knee joint: Secondary | ICD-10-CM | POA: Diagnosis not present

## 2017-07-30 DIAGNOSIS — G8918 Other acute postprocedural pain: Secondary | ICD-10-CM | POA: Diagnosis not present

## 2017-07-30 DIAGNOSIS — E1142 Type 2 diabetes mellitus with diabetic polyneuropathy: Secondary | ICD-10-CM | POA: Diagnosis present

## 2017-07-30 DIAGNOSIS — Z471 Aftercare following joint replacement surgery: Secondary | ICD-10-CM | POA: Diagnosis not present

## 2017-07-30 DIAGNOSIS — I10 Essential (primary) hypertension: Secondary | ICD-10-CM | POA: Diagnosis not present

## 2017-07-30 HISTORY — PX: KNEE ARTHROPLASTY: SHX992

## 2017-07-30 LAB — GLUCOSE, CAPILLARY
GLUCOSE-CAPILLARY: 97 mg/dL (ref 65–99)
GLUCOSE-CAPILLARY: 80 mg/dL (ref 65–99)
GLUCOSE-CAPILLARY: 170 mg/dL — AB (ref 65–99)
GLUCOSE-CAPILLARY: 161 mg/dL — AB (ref 65–99)

## 2017-07-30 LAB — TYPE AND SCREEN
ABO/RH(D): A POS
Antibody Screen: NEGATIVE

## 2017-07-30 LAB — ABO/RH: ABO/RH(D): A POS

## 2017-07-30 SURGERY — ARTHROPLASTY, KNEE, TOTAL, USING IMAGELESS COMPUTER-ASSISTED NAVIGATION
Anesthesia: Spinal | Site: Knee | Laterality: Right

## 2017-07-30 MED ORDER — GABAPENTIN 300 MG PO CAPS
300.0000 mg | ORAL_CAPSULE | Freq: Every day | ORAL | Status: DC
  Administered 2017-07-30 – 2017-07-31 (×2): 300 mg via ORAL
  Filled 2017-07-30 (×2): qty 1

## 2017-07-30 MED ORDER — METHOCARBAMOL 500 MG PO TABS
500.0000 mg | ORAL_TABLET | Freq: Four times a day (QID) | ORAL | Status: DC | PRN
  Administered 2017-07-31 – 2017-08-01 (×3): 500 mg via ORAL
  Filled 2017-07-30 (×2): qty 1

## 2017-07-30 MED ORDER — VITAMIN B-12 1000 MCG PO TABS
2500.0000 ug | ORAL_TABLET | Freq: Every day | ORAL | Status: DC
  Administered 2017-07-30 – 2017-08-01 (×3): 2500 ug via ORAL
  Filled 2017-07-30 (×3): qty 3

## 2017-07-30 MED ORDER — PROPOFOL 10 MG/ML IV BOLUS
INTRAVENOUS | Status: AC
  Filled 2017-07-30: qty 20

## 2017-07-30 MED ORDER — POLYETHYLENE GLYCOL 3350 17 G PO PACK: 17.0000 g | PACK | Freq: Every day | ORAL | Status: DC | PRN

## 2017-07-30 MED ORDER — ONDANSETRON HCL 4 MG/2ML IJ SOLN: 4.0000 mg | Freq: Once | INTRAMUSCULAR | Status: DC | PRN

## 2017-07-30 MED ORDER — HYDROCODONE-ACETAMINOPHEN 5-325 MG PO TABS
1.0000 | ORAL_TABLET | ORAL | Status: DC | PRN
  Administered 2017-07-30: 2 via ORAL
  Filled 2017-07-30 (×2): qty 2

## 2017-07-30 MED ORDER — CEFAZOLIN SODIUM-DEXTROSE 2-4 GM/100ML-% IV SOLN
2.0000 g | INTRAVENOUS | Status: AC
  Administered 2017-07-30: 2 g via INTRAVENOUS
  Filled 2017-07-30: qty 100

## 2017-07-30 MED ORDER — ISOPROPYL ALCOHOL 70 % SOLN
Status: DC | PRN
  Administered 2017-07-30: 1 via TOPICAL

## 2017-07-30 MED ORDER — ISOPROPYL ALCOHOL 70 % SOLN
Status: AC
  Filled 2017-07-30: qty 480

## 2017-07-30 MED ORDER — SODIUM CHLORIDE 0.9 % IJ SOLN
INTRAMUSCULAR | Status: AC
Start: 2017-07-30 — End: ?
  Filled 2017-07-30: qty 50

## 2017-07-30 MED ORDER — PROPOFOL 500 MG/50ML IV EMUL
INTRAVENOUS | Status: DC | PRN
  Administered 2017-07-30 (×2): 20 mg via INTRAVENOUS

## 2017-07-30 MED ORDER — ACETAMINOPHEN 10 MG/ML IV SOLN
1000.0000 mg | INTRAVENOUS | Status: AC
  Administered 2017-07-30: 1000 mg via INTRAVENOUS
  Filled 2017-07-30: qty 100

## 2017-07-30 MED ORDER — HYDROCODONE-ACETAMINOPHEN 7.5-325 MG PO TABS
1.0000 | ORAL_TABLET | ORAL | Status: DC | PRN
  Administered 2017-07-30 – 2017-08-01 (×9): 2 via ORAL
  Filled 2017-07-30 (×9): qty 2

## 2017-07-30 MED ORDER — FENTANYL CITRATE (PF) 100 MCG/2ML IJ SOLN: 25.0000 ug | INTRAMUSCULAR | Status: DC | PRN

## 2017-07-30 MED ORDER — PROPOFOL 10 MG/ML IV BOLUS
INTRAVENOUS | Status: AC
Start: 2017-07-30 — End: ?
  Filled 2017-07-30: qty 20

## 2017-07-30 MED ORDER — PHENOL 1.4 % MT LIQD: 1.0000 | OROMUCOSAL | Status: DC | PRN

## 2017-07-30 MED ORDER — INSULIN ASPART 100 UNIT/ML ~~LOC~~ SOLN
0.0000 [IU] | Freq: Three times a day (TID) | SUBCUTANEOUS | Status: DC
  Administered 2017-07-30: 2 [IU] via SUBCUTANEOUS
  Administered 2017-07-31: 1 [IU] via SUBCUTANEOUS
  Administered 2017-07-31: 2 [IU] via SUBCUTANEOUS
  Administered 2017-07-31: 3 [IU] via SUBCUTANEOUS
  Administered 2017-08-01 (×2): 2 [IU] via SUBCUTANEOUS

## 2017-07-30 MED ORDER — DEXTROSE 5 % IV SOLN
INTRAVENOUS | Status: DC | PRN
  Administered 2017-07-30: 25 ug/min via INTRAVENOUS

## 2017-07-30 MED ORDER — CHLORHEXIDINE GLUCONATE 4 % EX LIQD: 60.0000 mL | Freq: Once | CUTANEOUS | Status: DC

## 2017-07-30 MED ORDER — COLCHICINE 0.6 MG PO TABS: 0.6000 mg | ORAL_TABLET | Freq: Every day | ORAL | Status: DC | PRN

## 2017-07-30 MED ORDER — BUPIVACAINE-EPINEPHRINE (PF) 0.5% -1:200000 IJ SOLN
INTRAMUSCULAR | Status: DC | PRN
  Administered 2017-07-30: 30 mL via PERINEURAL

## 2017-07-30 MED ORDER — MENTHOL 3 MG MT LOZG: 1.0000 | LOZENGE | OROMUCOSAL | Status: DC | PRN

## 2017-07-30 MED ORDER — CEFAZOLIN SODIUM-DEXTROSE 2-4 GM/100ML-% IV SOLN
2.0000 g | Freq: Four times a day (QID) | INTRAVENOUS | Status: AC
  Administered 2017-07-30 (×2): 2 g via INTRAVENOUS
  Filled 2017-07-30 (×2): qty 100

## 2017-07-30 MED ORDER — MIDAZOLAM HCL 5 MG/5ML IJ SOLN
INTRAMUSCULAR | Status: DC | PRN
  Administered 2017-07-30 (×2): 1 mg via INTRAVENOUS

## 2017-07-30 MED ORDER — DOCUSATE SODIUM 100 MG PO CAPS
100.0000 mg | ORAL_CAPSULE | Freq: Two times a day (BID) | ORAL | Status: DC
  Administered 2017-08-01: 100 mg via ORAL
  Filled 2017-07-30 (×3): qty 1

## 2017-07-30 MED ORDER — BUPIVACAINE IN DEXTROSE 0.75-8.25 % IT SOLN
INTRATHECAL | Status: DC | PRN
  Administered 2017-07-30: 1.6 mL via INTRATHECAL

## 2017-07-30 MED ORDER — PROPOFOL 500 MG/50ML IV EMUL
INTRAVENOUS | Status: DC | PRN
  Administered 2017-07-30: 50 ug/kg/min via INTRAVENOUS

## 2017-07-30 MED ORDER — PROPOFOL 10 MG/ML IV BOLUS
INTRAVENOUS | Status: AC
  Filled 2017-07-30: qty 40

## 2017-07-30 MED ORDER — KETOROLAC TROMETHAMINE 30 MG/ML IJ SOLN
INTRAMUSCULAR | Status: DC | PRN
  Administered 2017-07-30: 30 mg

## 2017-07-30 MED ORDER — SODIUM CHLORIDE 0.9 % IV SOLN: INTRAVENOUS | Status: DC

## 2017-07-30 MED ORDER — PHENYLEPHRINE HCL 10 MG/ML IJ SOLN
INTRAMUSCULAR | Status: AC
  Filled 2017-07-30: qty 1

## 2017-07-30 MED ORDER — SODIUM CHLORIDE 0.9 % IR SOLN
Status: DC | PRN
  Administered 2017-07-30: 3000 mL

## 2017-07-30 MED ORDER — CARVEDILOL PHOSPHATE ER 20 MG PO CP24
20.0000 mg | ORAL_CAPSULE | Freq: Every day | ORAL | Status: DC
  Administered 2017-07-31 – 2017-08-01 (×2): 20 mg via ORAL
  Filled 2017-07-30 (×2): qty 1

## 2017-07-30 MED ORDER — DIPHENHYDRAMINE HCL 12.5 MG/5ML PO ELIX: 12.5000 mg | ORAL_SOLUTION | ORAL | Status: DC | PRN

## 2017-07-30 MED ORDER — LOSARTAN POTASSIUM 25 MG PO TABS
25.0000 mg | ORAL_TABLET | Freq: Every day | ORAL | Status: DC
  Administered 2017-07-30 – 2017-08-01 (×3): 25 mg via ORAL
  Filled 2017-07-30 (×3): qty 1

## 2017-07-30 MED ORDER — SODIUM CHLORIDE 0.9 % IJ SOLN
INTRAMUSCULAR | Status: DC | PRN
  Administered 2017-07-30: 30 mL

## 2017-07-30 MED ORDER — STERILE WATER FOR IRRIGATION IR SOLN
Status: DC | PRN
  Administered 2017-07-30: 2000 mL

## 2017-07-30 MED ORDER — LEVOTHYROXINE SODIUM 25 MCG PO TABS: 12.5000 ug | ORAL_TABLET | ORAL | Status: DC

## 2017-07-30 MED ORDER — ONDANSETRON HCL 4 MG/2ML IJ SOLN
INTRAMUSCULAR | Status: DC | PRN
  Administered 2017-07-30: 4 mg via INTRAVENOUS

## 2017-07-30 MED ORDER — BUPIVACAINE-EPINEPHRINE (PF) 0.25% -1:200000 IJ SOLN
INTRAMUSCULAR | Status: AC
  Filled 2017-07-30: qty 30

## 2017-07-30 MED ORDER — SODIUM CHLORIDE 0.9 % IV SOLN
INTRAVENOUS | Status: DC
  Administered 2017-07-30 (×2): via INTRAVENOUS

## 2017-07-30 MED ORDER — LEVOTHYROXINE SODIUM 25 MCG PO TABS
12.5000 ug | ORAL_TABLET | ORAL | Status: DC
  Administered 2017-07-31: 12.5 ug via ORAL
  Filled 2017-07-30: qty 1

## 2017-07-30 MED ORDER — SODIUM CHLORIDE 0.9 % IR SOLN
Status: DC | PRN
  Administered 2017-07-30: 1000 mL

## 2017-07-30 MED ORDER — DEXAMETHASONE SODIUM PHOSPHATE 10 MG/ML IJ SOLN
10.0000 mg | Freq: Once | INTRAMUSCULAR | Status: AC
  Administered 2017-07-31: 10 mg via INTRAVENOUS
  Filled 2017-07-30: qty 1

## 2017-07-30 MED ORDER — KETOROLAC TROMETHAMINE 30 MG/ML IJ SOLN
INTRAMUSCULAR | Status: AC
  Filled 2017-07-30: qty 1

## 2017-07-30 MED ORDER — METOCLOPRAMIDE HCL 5 MG PO TABS: 5.0000 mg | ORAL_TABLET | Freq: Three times a day (TID) | ORAL | Status: DC | PRN

## 2017-07-30 MED ORDER — MIDAZOLAM HCL 2 MG/2ML IJ SOLN
INTRAMUSCULAR | Status: AC
  Filled 2017-07-30: qty 2

## 2017-07-30 MED ORDER — INSULIN GLARGINE 100 UNIT/ML ~~LOC~~ SOLN
12.0000 [IU] | Freq: Every day | SUBCUTANEOUS | Status: DC
  Administered 2017-07-30 – 2017-07-31 (×2): 12 [IU] via SUBCUTANEOUS
  Filled 2017-07-30 (×4): qty 0.12

## 2017-07-30 MED ORDER — ONDANSETRON HCL 4 MG PO TABS: 4.0000 mg | ORAL_TABLET | Freq: Four times a day (QID) | ORAL | Status: DC | PRN

## 2017-07-30 MED ORDER — ALUM & MAG HYDROXIDE-SIMETH 200-200-20 MG/5ML PO SUSP: 30.0000 mL | ORAL | Status: DC | PRN

## 2017-07-30 MED ORDER — ASPIRIN 81 MG PO CHEW
81.0000 mg | CHEWABLE_TABLET | Freq: Two times a day (BID) | ORAL | Status: DC
  Administered 2017-07-30 – 2017-08-01 (×4): 81 mg via ORAL
  Filled 2017-07-30 (×4): qty 1

## 2017-07-30 MED ORDER — SENNA 8.6 MG PO TABS
1.0000 | ORAL_TABLET | Freq: Two times a day (BID) | ORAL | Status: DC
  Filled 2017-07-30 (×3): qty 1

## 2017-07-30 MED ORDER — ACETAMINOPHEN 325 MG PO TABS: 325.0000 mg | ORAL_TABLET | Freq: Four times a day (QID) | ORAL | Status: DC | PRN

## 2017-07-30 MED ORDER — FENTANYL CITRATE (PF) 100 MCG/2ML IJ SOLN
INTRAMUSCULAR | Status: AC
  Filled 2017-07-30: qty 2

## 2017-07-30 MED ORDER — ONDANSETRON HCL 4 MG/2ML IJ SOLN: 4.0000 mg | Freq: Four times a day (QID) | INTRAMUSCULAR | Status: DC | PRN

## 2017-07-30 MED ORDER — LACTATED RINGERS IV SOLN
INTRAVENOUS | Status: DC
  Administered 2017-07-30 (×2): via INTRAVENOUS

## 2017-07-30 MED ORDER — OXYCODONE HCL 5 MG/5ML PO SOLN
5.0000 mg | Freq: Once | ORAL | Status: DC | PRN
  Filled 2017-07-30: qty 5

## 2017-07-30 MED ORDER — BUPIVACAINE-EPINEPHRINE 0.25% -1:200000 IJ SOLN
INTRAMUSCULAR | Status: DC | PRN
  Administered 2017-07-30: 30 mL

## 2017-07-30 MED ORDER — METHOCARBAMOL 1000 MG/10ML IJ SOLN
500.0000 mg | Freq: Four times a day (QID) | INTRAVENOUS | Status: DC | PRN
  Administered 2017-07-30: 500 mg via INTRAVENOUS
  Filled 2017-07-30: qty 550

## 2017-07-30 MED ORDER — METOCLOPRAMIDE HCL 5 MG/ML IJ SOLN: 5.0000 mg | Freq: Three times a day (TID) | INTRAMUSCULAR | Status: DC | PRN

## 2017-07-30 MED ORDER — POVIDONE-IODINE 10 % EX SWAB
2.0000 "application " | Freq: Once | CUTANEOUS | Status: AC
  Administered 2017-07-30: 2 via TOPICAL

## 2017-07-30 MED ORDER — OXYCODONE HCL 5 MG PO TABS: 5.0000 mg | ORAL_TABLET | Freq: Once | ORAL | Status: DC | PRN

## 2017-07-30 MED ORDER — LEVOTHYROXINE SODIUM 25 MCG PO TABS
25.0000 ug | ORAL_TABLET | ORAL | Status: DC
  Administered 2017-08-01: 25 ug via ORAL
  Filled 2017-07-30: qty 1

## 2017-07-30 MED ORDER — MORPHINE SULFATE (PF) 2 MG/ML IV SOLN
0.5000 mg | INTRAVENOUS | Status: DC | PRN
  Administered 2017-07-30: 1 mg via INTRAVENOUS
  Filled 2017-07-30: qty 1

## 2017-07-30 MED ORDER — FENTANYL CITRATE (PF) 100 MCG/2ML IJ SOLN
INTRAMUSCULAR | Status: DC | PRN
  Administered 2017-07-30: 50 ug via INTRAVENOUS

## 2017-07-30 SURGICAL SUPPLY — 69 items
BAG ZIPLOCK 12X15 (MISCELLANEOUS) IMPLANT
BANDAGE ACE 4X5 VEL STRL LF (GAUZE/BANDAGES/DRESSINGS) ×2 IMPLANT
BANDAGE ACE 6X5 VEL STRL LF (GAUZE/BANDAGES/DRESSINGS) ×2 IMPLANT
BATTERY INSTRU NAVIGATION (MISCELLANEOUS) ×6 IMPLANT
BLADE SAW RECIPROCATING 77.5 (BLADE) ×2 IMPLANT
CHLORAPREP W/TINT 26ML (MISCELLANEOUS) ×4 IMPLANT
COVER SURGICAL LIGHT HANDLE (MISCELLANEOUS) ×2 IMPLANT
CUFF TOURN SGL QUICK 34 (TOURNIQUET CUFF) ×1
CUFF TRNQT CYL 34X4X40X1 (TOURNIQUET CUFF) ×1 IMPLANT
DECANTER SPIKE VIAL GLASS SM (MISCELLANEOUS) ×4 IMPLANT
DERMABOND ADVANCED (GAUZE/BANDAGES/DRESSINGS) ×1
DERMABOND ADVANCED .7 DNX12 (GAUZE/BANDAGES/DRESSINGS) ×1 IMPLANT
DRAPE SHEET LG 3/4 BI-LAMINATE (DRAPES) ×4 IMPLANT
DRAPE U-SHAPE 47X51 STRL (DRAPES) ×2 IMPLANT
DRSG AQUACEL AG ADV 3.5X10 (GAUZE/BANDAGES/DRESSINGS) ×2 IMPLANT
DRSG TEGADERM 4X4.75 (GAUZE/BANDAGES/DRESSINGS) IMPLANT
ELECT BLADE TIP CTD 4 INCH (ELECTRODE) ×2 IMPLANT
ELECT REM PT RETURN 15FT ADLT (MISCELLANEOUS) ×2 IMPLANT
EVACUATOR 1/8 PVC DRAIN (DRAIN) IMPLANT
GAUZE SPONGE 4X4 12PLY STRL (GAUZE/BANDAGES/DRESSINGS) ×2 IMPLANT
GLOVE BIO SURGEON STRL SZ8.5 (GLOVE) ×4 IMPLANT
GLOVE BIOGEL PI IND STRL 6.5 (GLOVE) ×2 IMPLANT
GLOVE BIOGEL PI IND STRL 7.0 (GLOVE) ×3 IMPLANT
GLOVE BIOGEL PI IND STRL 8 (GLOVE) ×1 IMPLANT
GLOVE BIOGEL PI IND STRL 8.5 (GLOVE) ×1 IMPLANT
GLOVE BIOGEL PI IND STRL 9 (GLOVE) ×1 IMPLANT
GLOVE BIOGEL PI INDICATOR 6.5 (GLOVE) ×2
GLOVE BIOGEL PI INDICATOR 7.0 (GLOVE) ×3
GLOVE BIOGEL PI INDICATOR 8 (GLOVE) ×1
GLOVE BIOGEL PI INDICATOR 8.5 (GLOVE) ×1
GLOVE BIOGEL PI INDICATOR 9 (GLOVE) ×1
GLOVE ECLIPSE 7.5 STRL STRAW (GLOVE) ×2 IMPLANT
GOWN SPEC L3 XXLG W/TWL (GOWN DISPOSABLE) ×2 IMPLANT
GOWN SRG XL XLNG 56XLVL 4 (GOWN DISPOSABLE) ×1 IMPLANT
GOWN STRL NON-REIN XL XLG LVL4 (GOWN DISPOSABLE) ×1
GOWN STRL REUS W/TWL XL LVL3 (GOWN DISPOSABLE) ×4 IMPLANT
HANDPIECE INTERPULSE COAX TIP (DISPOSABLE) ×1
HOOD PEEL AWAY FLYTE STAYCOOL (MISCELLANEOUS) ×8 IMPLANT
INSERT TIBIAL BEAR SZ4 16MMX3 (Insert) ×2 IMPLANT
KNEE FEMORAL COMP RT RETAIN (Knees) ×2 IMPLANT
KNEE PATELLA ASYMMETRIC 10X35 (Knees) ×2 IMPLANT
KNEE TIBIAL COMP TRI SZ4 (Knees) ×2 IMPLANT
MARKER SKIN DUAL TIP RULER LAB (MISCELLANEOUS) ×2 IMPLANT
NEEDLE SPNL 18GX3.5 QUINCKE PK (NEEDLE) ×2 IMPLANT
NS IRRIG 1000ML POUR BTL (IV SOLUTION) ×2 IMPLANT
PACK TOTAL KNEE CUSTOM (KITS) ×2 IMPLANT
PADDING CAST COTTON 6X4 STRL (CAST SUPPLIES) ×2 IMPLANT
POSITIONER SURGICAL ARM (MISCELLANEOUS) ×2 IMPLANT
SAW OSC TIP CART 19.5X105X1.3 (SAW) ×2 IMPLANT
SEALER BIPOLAR AQUA 6.0 (INSTRUMENTS) ×2 IMPLANT
SET HNDPC FAN SPRY TIP SCT (DISPOSABLE) ×1 IMPLANT
SET PAD KNEE POSITIONER (MISCELLANEOUS) ×2 IMPLANT
SPONGE DRAIN TRACH 4X4 STRL 2S (GAUZE/BANDAGES/DRESSINGS) IMPLANT
SPONGE LAP 18X18 RF (DISPOSABLE) IMPLANT
SUT MNCRL AB 3-0 PS2 18 (SUTURE) ×2 IMPLANT
SUT MON AB 2-0 CT1 36 (SUTURE) ×4 IMPLANT
SUT STRATAFIX PDO 1 14 VIOLET (SUTURE) ×1
SUT STRATFX PDO 1 14 VIOLET (SUTURE) ×1
SUT VIC AB 1 CT1 36 (SUTURE) ×6 IMPLANT
SUT VIC AB 2-0 CT1 27 (SUTURE) ×1
SUT VIC AB 2-0 CT1 TAPERPNT 27 (SUTURE) ×1 IMPLANT
SUTURE STRATFX PDO 1 14 VIOLET (SUTURE) ×1 IMPLANT
SYR 50ML LL SCALE MARK (SYRINGE) ×2 IMPLANT
TOWER CARTRIDGE SMART MIX (DISPOSABLE) IMPLANT
TRAY FOLEY CATH 14FRSI W/METER (CATHETERS) ×2 IMPLANT
TRAY FOLEY MTR SLVR 16FR STAT (SET/KITS/TRAYS/PACK) IMPLANT
WATER STERILE IRR 1000ML POUR (IV SOLUTION) ×4 IMPLANT
WRAP KNEE MAXI GEL POST OP (GAUZE/BANDAGES/DRESSINGS) ×2 IMPLANT
YANKAUER SUCT BULB TIP 10FT TU (MISCELLANEOUS) ×2 IMPLANT

## 2017-07-30 NOTE — Discharge Instructions (Signed)
° °Dr. Zahli Vetsch °Total Joint Specialist °[facility] °[address] °[place], [place] [address] °[phone] ° °TOTAL KNEE REPLACEMENT POSTOPERATIVE DIRECTIONS ° ° ° °Knee Rehabilitation, Guidelines Following Surgery  °Results after knee surgery are often greatly improved when you follow the exercise, range of motion and muscle strengthening exercises prescribed by your doctor. Safety measures are also important to protect the knee from further injury. Any time any of these exercises cause you to have increased pain or swelling in your knee joint, decrease the amount until you are comfortable again and slowly increase them. If you have problems or questions, call your caregiver or physical therapist for advice.  ° °WEIGHT BEARING °Weight bearing as tolerated with assist device (walker, cane, etc) as directed, use it as long as suggested by your surgeon or therapist, typically at least 4-6 weeks. ° °HOME CARE INSTRUCTIONS  °Remove items at home which could result in a fall. This includes throw rugs or furniture in walking pathways.  °Continue medications as instructed at time of discharge. °You may have some home medications which will be placed on hold until you complete the course of blood thinner medication.  °You may start showering once you are discharged home but do not submerge the incision under water. Just pat the incision dry and apply a dry gauze dressing on daily. °Walk with walker as instructed.  °You may resume a sexual relationship in one month or when given the OK by your doctor.  °· Use walker as long as suggested by your caregivers. °· Avoid periods of inactivity such as sitting longer than an hour when not asleep. This helps prevent blood clots.  °You may put full weight on your legs and walk as much as is comfortable.  °You may return to work once you are cleared by your doctor.  °Do not drive a car for 6 weeks or until released by you surgeon.  °· Do not drive  while taking narcotics.  °Wear the elastic stockings for three weeks following surgery during the day but you may remove then at night. °Make sure you keep all of your appointments after your operation with all of your doctors and caregivers. You should call the office at the above phone number and make an appointment for approximately two weeks after the date of your surgery. °Do not remove your surgical dressing. The dressing is waterproof; you may take showers in 3 days, but do not take tub baths or submerge the dressing. °Please pick up a stool softener and laxative for home use as long as you are requiring pain medications. °· ICE to the affected knee every three hours for 30 minutes at a time and then as needed for pain and swelling.  Continue to use ice on the knee for pain and swelling from surgery. You may notice swelling that will progress down to the foot and ankle.  This is normal after surgery.  Elevate the leg when you are not up walking on it.   °It is important for you to complete the blood thinner medication as prescribed by your doctor. °· Continue to use the breathing machine which will help keep your temperature down.  It is common for your temperature to cycle up and down following surgery, especially at night when you are not up moving around and exerting yourself.  The breathing machine keeps your lungs expanded and your temperature down. ° °RANGE OF MOTION AND STRENGTHENING EXERCISES  °Rehabilitation of the knee is important following   a knee injury or an operation. After just a few days of immobilization, the muscles of the thigh which control the knee become weakened and shrink (atrophy). Knee exercises are designed to build up the tone and strength of the thigh muscles and to improve knee motion. Often times heat used for twenty to thirty minutes before working out will loosen up your tissues and help with improving the range of motion but do not use heat for the first two weeks following  surgery. These exercises can be done on a training (exercise) mat, on the floor, on a table or on a bed. Use what ever works the best and is most comfortable for you Knee exercises include:  °Leg Lifts - While your knee is still immobilized in a splint or cast, you can do straight leg raises. Lift the leg to 60 degrees, hold for 3 sec, and slowly lower the leg. Repeat 10-20 times 2-3 times daily. Perform this exercise against resistance later as your knee gets better.  °Quad and Hamstring Sets - Tighten up the muscle on the front of the thigh (Quad) and hold for 5-10 sec. Repeat this 10-20 times hourly. Hamstring sets are done by pushing the foot backward against an object and holding for 5-10 sec. Repeat as with quad sets.  °A rehabilitation program following serious knee injuries can speed recovery and prevent re-injury in the future due to weakened muscles. Contact your doctor or a physical therapist for more information on knee rehabilitation.  ° °SKILLED REHAB INSTRUCTIONS: °If the patient is transferred to a skilled rehab facility following release from the hospital, a list of the current medications will be sent to the facility for the patient to continue.  When discharged from the skilled rehab facility, please have the facility set up the patient's Home Health Physical Therapy prior to being released. Also, the skilled facility will be responsible for providing the patient with their medications at time of release from the facility to include their pain medication, the muscle relaxants, and their blood thinner medication. If the patient is still at the rehab facility at time of the two week follow up appointment, the skilled rehab facility will also need to assist the patient in arranging follow up appointment in our office and any transportation needs. ° °MAKE SURE YOU:  °Understand these instructions.  °Will watch your condition.  °Will get help right away if you are not doing well or get worse.   ° ° °Pick up stool softner and laxative for home use following surgery while on pain medications. °Do NOT remove your dressing. You may shower.  °Do not take tub baths or submerge incision under water. °May shower starting three days after surgery. °Please use a clean towel to pat the incision dry following showers. °Continue to use ice for pain and swelling after surgery. °Do not use any lotions or creams on the incision until instructed by your surgeon. ° °

## 2017-07-30 NOTE — Evaluation (Signed)
Physical Therapy Evaluation Patient Details Name: Teresa Golden MRN: [id number] DOB: 12-30-1945 Today's Date: 07/30/2017   History of Present Illness  72 yo female s/p R TKA 07/30/17  Clinical Impression  On eval POD 0, pt was Min assist for mobility. She walked ~60 feet with a RW. Mild pain with activity. Will follow and progress activity as tolerated.     Follow Up Recommendations Follow surgeon's recommendation for DC plan and follow-up therapies    Equipment Recommendations  Rolling walker with 5" wheels(if pt doesn't already have one. She cannot use a rollator.)    Recommendations for Other Services       Precautions / Restrictions Precautions Precautions: Fall Restrictions Weight Bearing Restrictions: No Other Position/Activity Restrictions: WBAT      Mobility  Bed Mobility Overal bed mobility: Needs Assistance Bed Mobility: Supine to Sit     Supine to sit: Min guard;HOB elevated     General bed mobility comments: Close guard for safety. VCs safety, technique.   Transfers Overall transfer level: Needs assistance Equipment used: Rolling walker (2 wheeled) Transfers: Sit to/from Stand Sit to Stand: From elevated surface;Mod assist         General transfer comment: VCs safety, technique, hand/LE placement. Assist to rise, stabilize, control descent. Stand pivot, bed to bsc, with RW  Ambulation/Gait Ambulation/Gait assistance: Min assist Ambulation Distance (Feet): 60 Feet Assistive device: Rolling walker (2 wheeled) Gait Pattern/deviations: Step-to pattern     General Gait Details: VCs safety, technique, sequence, posture. Assist to stabilize throughout distance.   Stairs            Wheelchair Mobility    Modified Rankin (Stroke Patients Only)       Balance Overall balance assessment: Mild deficits observed, not formally tested                                           Pertinent Vitals/Pain Pain Assessment: 0-10 Pain  Score: 5  Pain Location: R knee Pain Descriptors / Indicators: Aching;Sore Pain Intervention(s): Monitored during session;Repositioned;Ice applied    Home Living Family/patient expects to be discharged to:: Private residence Living Arrangements: Alone Available Help at Discharge: Friend(s);Available PRN/intermittently Type of Home: Apartment Home Access: Level entry;Ramped entrance     Home Layout: One level Home Equipment: Walker - 4 wheels;Cane - single point      Prior Function Level of Independence: Independent with assistive device(s)               Hand Dominance        Extremity/Trunk Assessment   Upper Extremity Assessment Upper Extremity Assessment: Overall WFL for tasks assessed    Lower Extremity Assessment Lower Extremity Assessment: Generalized weakness(s/p R TKA)    Cervical / Trunk Assessment Cervical / Trunk Assessment: Normal  Communication   Communication: No difficulties  Cognition Arousal/Alertness: Awake/alert Behavior During Therapy: WFL for tasks assessed/performed Overall Cognitive Status: Within Functional Limits for tasks assessed                                        General Comments      Exercises     Assessment/Plan    PT Assessment Patient needs continued PT services  PT Problem List Decreased strength;Decreased balance;Decreased range of motion;Decreased mobility;Decreased activity tolerance;Pain;Decreased knowledge of use  of DME;Decreased knowledge of precautions       PT Treatment Interventions DME instruction;Gait training;Functional mobility training;Therapeutic activities;Balance training;Patient/family education;Therapeutic exercise    PT Goals (Current goals can be found in the Care Plan section)  Acute Rehab PT Goals Patient Stated Goal: regain independence. return to water aerobics PT Goal Formulation: With patient Time For Goal Achievement: 08/13/17 Potential to Achieve Goals: Good     Frequency 7X/week   Barriers to discharge        Co-evaluation               AM-PAC PT "6 Clicks" Daily Activity  Outcome Measure Difficulty turning over in bed (including adjusting bedclothes, sheets and blankets)?: A Lot Difficulty moving from lying on back to sitting on the side of the bed? : A Lot Difficulty sitting down on and standing up from a chair with arms (e.g., wheelchair, bedside commode, etc,.)?: Unable Help needed moving to and from a bed to chair (including a wheelchair)?: A Little Help needed walking in hospital room?: A Little Help needed climbing 3-5 steps with a railing? : A Lot 6 Click Score: 13    End of Session Equipment Utilized During Treatment: Gait belt Activity Tolerance: Patient tolerated treatment well Patient left: in chair;with call bell/phone within reach   PT Visit Diagnosis: Difficulty in walking, not elsewhere classified (R26.2);Pain Pain - Right/Left: Right Pain - part of body: Knee    Time: 1445-1501 PT Time Calculation (min) (ACUTE ONLY): 16 min   Charges:   PT Evaluation $PT Eval Low Complexity: 1 Low     PT G Codes:          Weston Anna, MPT Pager: [phone]

## 2017-07-30 NOTE — Anesthesia Postprocedure Evaluation (Signed)
Anesthesia Post Note  Patient: Teresa Golden  Procedure(s) Performed: RIGHT TOTAL KNEE ARTHROPLASTY WITH COMPUTER NAVIGATION (Right Knee)     Patient location during evaluation: PACU Anesthesia Type: Spinal Level of consciousness: awake and alert Pain management: pain level controlled Vital Signs Assessment: post-procedure vital signs reviewed and stable Respiratory status: spontaneous breathing and respiratory function stable Cardiovascular status: blood pressure returned to baseline and stable Postop Assessment: spinal receding and no apparent nausea or vomiting Anesthetic complications: no    Last Vitals:  Vitals:   07/30/17 1228 07/30/17 1324  BP: (!) 168/72 (!) 155/64  Pulse:  61  Resp: 14 14  Temp: (!) 36.4 C 36.7 C  SpO2: 100% 100%    Last Pain:  Vitals:   07/30/17 1313  TempSrc:   PainSc: [place]

## 2017-07-30 NOTE — Plan of Care (Signed)
Reviewed plan of care, specifically pain control, safety precautions, IS use, and importance of notifying staff with any questions or concerns. Pt attentive and verbalized understanding of all education.

## 2017-07-30 NOTE — Transfer of Care (Signed)
Immediate Anesthesia Transfer of Care Note  Patient: Teresa Golden  Procedure(s) Performed: RIGHT TOTAL KNEE ARTHROPLASTY WITH COMPUTER NAVIGATION (Right Knee)  Patient Location: PACU  Anesthesia Type:Spinal and MAC combined with regional for post-op pain  Level of Consciousness: awake, alert  and oriented  Airway & Oxygen Therapy: Patient Spontanous Breathing and Patient connected to face mask oxygen  Post-op Assessment: Report given to RN and Post -op Vital signs reviewed and stable  Post vital signs: Reviewed and stable  Last Vitals:  Vitals Value Taken Time  BP    Temp    Pulse    Resp    SpO2      Last Pain:  Vitals:   07/30/17 0610  TempSrc:   PainSc: 5       Patients Stated Pain Goal: 4 ([id number])  Complications: No apparent anesthesia complications

## 2017-07-30 NOTE — Interval H&P Note (Signed)
History and Physical Interval Note:  07/30/2017 7:33 AM  Teresa Golden  has presented today for surgery, with the diagnosis of Degenerative joint disease right knee  The various methods of treatment have been discussed with the patient and family. After consideration of risks, benefits and other options for treatment, the patient has consented to  Procedure(s) with comments: RIGHT TOTAL KNEE ARTHROPLASTY WITH COMPUTER NAVIGATION (Right) - Needs RNFA as a surgical intervention .  The patient's history has been reviewed, patient examined, no change in status, stable for surgery.  I have reviewed the patient's chart and labs.  Questions were answered to the patient's satisfaction.     Hilton Cork Dornell Grasmick

## 2017-07-30 NOTE — Op Note (Signed)
OPERATIVE REPORT  SURGEON: Rod Can, MD   ASSISTANT: Sherlean Foot, RNFA.  PREOPERATIVE DIAGNOSIS: Right knee arthritis.   POSTOPERATIVE DIAGNOSIS: Right knee arthritis.   PROCEDURE: Right total knee arthroplasty.   IMPLANTS: Stryker Triathlon CR femur, size 4. Stryker Tritanium tibia, size 4. X3 polyethelyene insert, size 16 mm, CS. 3 button asymmetric patella, size 35 mm.  ANESTHESIA:  Regional and Spinal  TOURNIQUET TIME: Not utilized.   ESTIMATED BLOOD LOSS:-150 mL    ANTIBIOTICS: 2 g Ancef.  DRAINS: None.  COMPLICATIONS: None   CONDITION: PACU - hemodynamically stable.   BRIEF CLINICAL NOTE: Teresa Golden is a 72 y.o. female with a long-standing history of Right knee arthritis. After failing conservative management, the patient was indicated for total knee arthroplasty. The risks, benefits, and alternatives to the procedure were explained, and the patient elected to proceed.  PROCEDURE IN DETAIL: Adductor canal block was obtained in the pre-op holding area. Once inside the operative room, spinal anesthesia was obtained, and a foley catheter was inserted. The patient was then positioned, a nonsterile tourniquet was placed, and the lower extremity was prepped and draped in the normal sterile surgical fashion. A time-out was called verifying side and site of surgery. The patient received IV antibiotics within 60 minutes of beginning the procedure. The tourniquet was not utilized.  An anterior approach to the knee was performed utilizing a midvastus arthrotomy. A medial release was performed and the patellar fat pad was excised. Stryker navigation was used to cut the distal femur perpendicular to the mechanical axis. A freehand patellar resection was performed, and the patella was sized an prepared with 3 lug holes.  Nagivation was used to make a neutral proximal tibia  resection, taking 5 mm of bone from the less affected lateral side with 3 degrees of slope. The menisci were excised. A spacer block was placed, and the alignment and balance in extension were confirmed.   The distal femur was sized using the 3-degree external rotation guide referencing the posterior femoral cortex. The appropriate 4-in-1 cutting block was pinned into place. Rotation was checked using Whiteside's line, the epicondylar axis, and then confirmed with a spacer block in flexion. The remaining femoral cuts were performed, taking care to protect the MCL.  Large osteophytes and loose bodies were removed from the posterior aspect of the knee.  The tibia was sized and the trial tray was pinned into place. The remaining trail components were inserted. The knee was stable to varus and valgus stress through a full range of motion. The patella tracked centrally, and the PCL was well balanced. The trial components were removed, and the proximal tibial surface was prepared. Final components were impacted into place. The knee was tested for a final time and found to be well balanced.  The wound was copiously irrigated with normal saline with pulse lavage. Marcaine solution was injected into the periarticular soft tissue. The wound was closed in layers using #1 Vicryl and Stratafix for the fascia, 2-0 Vicryl for the subcutaneous fat, 2-0 Monocryl for the deep dermal layer, 3-0 running Monocryl subcuticular Stitch, and Dermabond for the skin. Once the glue was fully dried, an Aquacell Ag and compressive dressing were applied. Tthe patient was transported to the recovery room in stable condition. Sponge, needle, and instrument counts were correct at the end of the case x2. The patient tolerated the procedure well and there were no known complications.

## 2017-07-30 NOTE — Anesthesia Procedure Notes (Signed)
Anesthesia Regional Block: Adductor canal block   Pre-Anesthetic Checklist: ,, timeout performed, Correct Patient, Correct Site, Correct Laterality, Correct Procedure, Correct Position, site marked, Risks and benefits discussed,  Surgical consent,  Pre-op evaluation,  At surgeon's request and post-op pain management  Laterality: Right  Prep: chloraprep       Needles:  Injection technique: Single-shot  Needle Type: Echogenic Needle     Needle Length: 9cm  Needle Gauge: 21     Additional Needles:   Narrative:  Start time: 07/30/2017 6:56 AM End time: 07/30/2017 7:00 AM Injection made incrementally with aspirations every 5 mL.  Performed by: Personally  Anesthesiologist: Audry Pili, MD  Additional Notes: No pain on injection. No increased resistance to injection. Injection made in 5cc increments. Good needle visualization. Patient tolerated the procedure well.

## 2017-07-30 NOTE — Anesthesia Procedure Notes (Signed)
Spinal  Patient location during procedure: OR Start time: 07/30/2017 7:48 AM End time: 07/30/2017 7:53 AM Staffing Anesthesiologist: Audry Pili, MD Performed: anesthesiologist  Preanesthetic Checklist Completed: patient identified, surgical consent, pre-op evaluation, timeout performed, IV checked, risks and benefits discussed and monitors and equipment checked Spinal Block Patient position: sitting Prep: DuraPrep Patient monitoring: heart rate, cardiac monitor, continuous pulse ox and blood pressure Approach: midline Location: L3-4 Injection technique: single-shot Needle Needle type: Pencan  Needle gauge: 24 G Additional Notes Functioning IV was confirmed and monitors were applied. Sterile prep and drape, including hand hygiene, mask, and sterile gloves were used. The patient was positioned and the spine was prepped. The skin was anesthetized with lidocaine. Free flow of clear CSF was obtained prior to injecting local anesthetic into the CSF. The spinal needle aspirated freely following injection. The needle was carefully withdrawn. The patient tolerated the procedure well. Consent was obtained prior to the procedure with all questions answered and concerns addressed. Risks including, but not limited to, bleeding, infection, nerve damage, paralysis, failed block, inadequate analgesia, allergic reaction, high spinal, itching, and headache were discussed and the patient wished to proceed.  Renold Don, MD

## 2017-07-30 NOTE — Anesthesia Procedure Notes (Signed)
Date/Time: 07/30/2017 7:43 AM Performed by: Glory Buff, CRNA Oxygen Delivery Method: Simple face mask

## 2017-07-31 ENCOUNTER — Encounter (HOSPITAL_COMMUNITY): Payer: Self-pay | Admitting: Orthopedic Surgery

## 2017-07-31 LAB — CBC
HCT: 29.7 % — ABNORMAL LOW (ref 36.0–46.0)
Hemoglobin: 9.9 g/dL — ABNORMAL LOW (ref 12.0–15.0)
MCH: 29.6 pg (ref 26.0–34.0)
MCHC: 33.3 g/dL (ref 30.0–36.0)
MCV: 88.7 fL (ref 78.0–100.0)
Platelets: 163 10*3/uL (ref 150–400)
RBC: 3.35 MIL/uL — ABNORMAL LOW (ref 3.87–5.11)
RDW: 14.4 % (ref 11.5–15.5)
WBC: 8.7 10*3/uL (ref 4.0–10.5)

## 2017-07-31 LAB — BASIC METABOLIC PANEL
Anion gap: 9 (ref 5–15)
BUN: 14 mg/dL (ref 6–20)
CHLORIDE: 107 mmol/L (ref 101–111)
CO2: 25 mmol/L (ref 22–32)
CREATININE: 0.98 mg/dL (ref 0.44–1.00)
Calcium: 8.2 mg/dL — ABNORMAL LOW (ref 8.9–10.3)
GFR, EST NON AFRICAN AMERICAN: 56 mL/min — AB (ref >60)
Glucose, Bld: 157 mg/dL — ABNORMAL HIGH (ref 65–99)
Potassium: 3.7 mmol/L (ref 3.5–5.1)
SODIUM: 141 mmol/L (ref 135–145)

## 2017-07-31 LAB — GLUCOSE, CAPILLARY
GLUCOSE-CAPILLARY: 170 mg/dL — AB (ref 65–99)
GLUCOSE-CAPILLARY: 181 mg/dL — AB (ref 65–99)
Glucose-Capillary: 142 mg/dL — ABNORMAL HIGH (ref 65–99)
Glucose-Capillary: 209 mg/dL — ABNORMAL HIGH (ref 65–99)

## 2017-07-31 MED ORDER — HYDROCODONE-ACETAMINOPHEN 5-325 MG PO TABS: 1.0000 | ORAL_TABLET | Freq: Four times a day (QID) | ORAL | 0 refills | Status: DC | PRN

## 2017-07-31 MED ORDER — ASPIRIN 81 MG PO CHEW: 81.0000 mg | CHEWABLE_TABLET | Freq: Two times a day (BID) | ORAL | 1 refills | Status: DC

## 2017-07-31 MED ORDER — DOCUSATE SODIUM 100 MG PO CAPS: 100.0000 mg | ORAL_CAPSULE | Freq: Two times a day (BID) | ORAL | 1 refills | Status: DC

## 2017-07-31 MED ORDER — ONDANSETRON HCL 4 MG PO TABS: 4.0000 mg | ORAL_TABLET | Freq: Four times a day (QID) | ORAL | 0 refills | Status: DC | PRN

## 2017-07-31 MED ORDER — SENNA 8.6 MG PO TABS: 1.0000 | ORAL_TABLET | Freq: Two times a day (BID) | ORAL | 0 refills | Status: DC

## 2017-07-31 NOTE — Progress Notes (Signed)
Physical Therapy Treatment Patient Details Name: Teresa Golden MRN: [id number] DOB: 01-08-1946 Today's Date: 07/31/2017    History of Present Illness 72 yo female s/p R TKA 07/30/17    PT Comments    Progressing very slowly with mobility this a.m. Mobility is limited by pain and dizziness. Will progress activity as pt is able to tolerate   Follow Up Recommendations  Follow surgeon's recommendation for DC plan and follow-up therapies     Equipment Recommendations  Rolling walker with 5" wheels    Recommendations for Other Services       Precautions / Restrictions Precautions Precautions: Fall Restrictions Weight Bearing Restrictions: No Other Position/Activity Restrictions: WBAT    Mobility  Bed Mobility Overal bed mobility: Needs Assistance Bed Mobility: Supine to Sit     Supine to sit: Min assist;HOB elevated     General bed mobility comments: Assist for R LE and to scoot to EOB. Increased time. Pt relied on bedrail. Multiple rest breaks needed due to fatigue, pain.   Transfers Overall transfer level: Needs assistance Equipment used: Rolling walker (2 wheeled) Transfers: Sit to/from Stand Sit to Stand: Mod assist         General transfer comment: VCs safety, technique, hand/LE placement. Assist to rise, stabilize, control descent. Stand pivot, bed to bsc, with RW  Ambulation/Gait Ambulation/Gait assistance: Min assist Ambulation Distance (Feet): 4 Feet Assistive device: Rolling walker (2 wheeled) Gait Pattern/deviations: Step-to pattern;Decreased stance time - right;Antalgic;Trunk flexed     General Gait Details: VCs safety, technique, sequence, posture. Pt barely able to take a few steps in room today due to pain, dizziness. Brought recliner up for pt to sit down.    Stairs             Wheelchair Mobility    Modified Rankin (Stroke Patients Only)       Balance Overall balance assessment: Needs assistance         Standing balance  support: Bilateral upper extremity supported Standing balance-Leahy Scale: Poor                              Cognition Arousal/Alertness: Awake/alert Behavior During Therapy: WFL for tasks assessed/performed Overall Cognitive Status: Within Functional Limits for tasks assessed                                        Exercises Total Joint Exercises Ankle Circles/Pumps: AROM;Both;10 reps;Supine Quad Sets: AROM;Both;10 reps;Supine Heel Slides: AAROM;Right;10 reps;Supine Hip ABduction/ADduction: AAROM;10 reps;Right;Supine Straight Leg Raises: AAROM;Right;10 reps;Supine Knee Flexion: AAROM;Right;10 reps;Seated Goniometric ROM: ~10-35 degrees (limited by pain)    General Comments        Pertinent Vitals/Pain Pain Assessment: 0-10 Pain Score: 8  Pain Location: R knee Pain Descriptors / Indicators: Aching;Sore Pain Intervention(s): Limited activity within patient's tolerance;Repositioned;Ice applied    Home Living                      Prior Function            PT Goals (current goals can now be found in the care plan section) Progress towards PT goals: Progressing toward goals    Frequency    7X/week      PT Plan Current plan remains appropriate    Co-evaluation  AM-PAC PT "6 Clicks" Daily Activity  Outcome Measure  Difficulty turning over in bed (including adjusting bedclothes, sheets and blankets)?: A Lot Difficulty moving from lying on back to sitting on the side of the bed? : Unable Difficulty sitting down on and standing up from a chair with arms (e.g., wheelchair, bedside commode, etc,.)?: Unable Help needed moving to and from a bed to chair (including a wheelchair)?: A Lot Help needed walking in hospital room?: A Lot Help needed climbing 3-5 steps with a railing? : Total 6 Click Score: 9    End of Session Equipment Utilized During Treatment: Gait belt Activity Tolerance: Patient limited by  fatigue;Patient limited by pain Patient left: in chair;with call bell/phone within reach;with chair alarm set   PT Visit Diagnosis: Difficulty in walking, not elsewhere classified (R26.2);Pain Pain - Right/Left: Right Pain - part of body: Knee     Time: 2706-2376 PT Time Calculation (min) (ACUTE ONLY): 29 min  Charges:  $Gait Training: 8-22 mins $Therapeutic Exercise: 8-22 mins                    G Codes:         Weston Anna, MPT Pager: [phone]

## 2017-07-31 NOTE — Discharge Summary (Signed)
Physician Discharge Summary  [name] MRN: [id number] DOB/AGE: 1946/01/21 72 y.o.  Admit date: 07/30/2017 Discharge date: 08/01/2017  Admission Diagnoses:  Osteoarthritis of right knee  Discharge Diagnoses:  Principal Problem:   Osteoarthritis of right knee   Past Medical History:  Diagnosis Date  . Arthritis   . Chronic kidney disease    self reports ckd stage 3   . Diabetes mellitus, type II, insulin dependent ([place])    With neurologic complications. Bilateral lower extremity peripheral neuropathy  . Dyspnea    with excertion; no issues now since weight loss surgery   . Endometriosis   . Essential hypertension   . GERD (gastroesophageal reflux disease)   . 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 Hypothyroidism   . Morbid obesity with BMI of 50.0-59.9, adult ([place])   . Scoliosis     Surgeries: Procedure(s): RIGHT TOTAL KNEE ARTHROPLASTY WITH COMPUTER NAVIGATION on 07/30/2017   Consultants (if any):   Discharged Condition: Improved  Hospital Course: Teresa Golden is an 72 y.o. female who was admitted 07/30/2017 with a diagnosis of Osteoarthritis of right knee and went to the operating room on 07/30/2017 and underwent the above named procedures.    She was given perioperative antibiotics:  Anti-infectives (From admission, onward)   Start     Dose/Rate Route Frequency Ordered Stop   07/30/17 1400  ceFAZolin (ANCEF) IVPB 2g/100 mL premix     2 g 200 mL/hr over 30 Minutes Intravenous Every 6 hours 07/30/17 1232 07/30/17 2121   07/30/17 0600  ceFAZolin (ANCEF) IVPB 2g/100 mL premix     2 g 200 mL/hr over 30 Minutes Intravenous On call to O.R. 07/30/17 0045 07/30/17 9977    .  She was given sequential compression devices, early ambulation, and ASA for DVT prophylaxis.  She benefited maximally from the hospital stay and there were no complications.    Recent vital signs:  Vitals:   07/31/17 2231 08/01/17 0506  BP: (!)  160/68 (!) 153/61  Pulse: 68 76  Resp:  16  Temp:  99.1 F (37.3 C)  SpO2:  99%    Recent laboratory studies:  Lab Results  Component Value Date   HGB 8.9 (L) 08/01/2017   HGB 9.9 (L) 07/31/2017   HGB 11.9 (L) 07/16/2017   Lab Results  Component Value Date   WBC 10.0 08/01/2017   PLT 168 08/01/2017   No results found for: INR Lab Results  Component Value Date   NA 141 07/31/2017   K 3.7 07/31/2017   CL 107 07/31/2017   CO2 25 07/31/2017   BUN 14 07/31/2017   CREATININE 0.98 07/31/2017   GLUCOSE 157 (H) 07/31/2017    Discharge Medications:   Allergies as of 08/01/2017      Reactions   Meloxicam Other (See Comments)   Fever; muscle aches; "flu-like" symptoms   Other    Rose fever and hay fever       Medication List    STOP taking these medications   acetaminophen 325 MG tablet Commonly known as:  TYLENOL     TAKE these medications   aspirin 81 MG chewable tablet Chew 1 tablet (81 mg total) by mouth 2 (two) times daily.   B-complex with vitamin C tablet Take 1 tablet by mouth daily.   carvedilol 20 MG 24 hr capsule Commonly known as:  COREG CR Take 20 mg by mouth daily.  Cholecalciferol 5000 units Tabs Take 5,000 Units by mouth daily.   colchicine 0.6 MG tablet Take 1 tablet (0.6 mg total) by mouth daily. What changed:    when to take this  reasons to take this   diclofenac sodium 1 % Gel Commonly known as:  VOLTAREN Apply 2 g topically at bedtime. Applied to feet at bedtime for neuropathy pain   docusate sodium 100 MG capsule Commonly known as:  COLACE Take 1 capsule (100 mg total) by mouth 2 (two) times daily.   gabapentin 300 MG capsule Commonly known as:  NEURONTIN Take 300 mg by mouth at bedtime.   HYDROcodone-acetaminophen 5-325 MG tablet Commonly known as:  NORCO/VICODIN Take 1-2 tablets by mouth every 6 (six) hours as needed (postop knee pain).   levothyroxine 25 MCG tablet Commonly known as:  SYNTHROID, LEVOTHROID Take  12.5-25 mcg by mouth See admin instructions. Take 0.5 tablet (12.5 mcg) by mouth on Mondays through Fridays, then take 1 tablet (25 mcg) by mouth on Saturdays and Sundays.   losartan 25 MG tablet Commonly known as:  COZAAR Take 25 mg by mouth daily.   HAIR/SKIN/NAILS/BIOTIN PO Take 1 tablet by mouth daily.   multivitamin with minerals tablet Take 1 tablet by mouth daily. Women's 50+   naproxen sodium 220 MG tablet Commonly known as:  ALEVE Take 440 mg by mouth at bedtime.   ondansetron 4 MG tablet Commonly known as:  ZOFRAN Take 1 tablet (4 mg total) by mouth every 6 (six) hours as needed for nausea.   OZEMPIC 0.25 or 0.5 MG/DOSE Sopn Generic drug:  Semaglutide Inject 0.5 mg into the skin every Saturday at 6 PM.   senna 8.6 MG Tabs tablet Commonly known as:  SENOKOT Take 1 tablet (8.6 mg total) by mouth 2 (two) times daily.   TRESIBA FLEXTOUCH 100 UNIT/ML Sopn FlexTouch Pen Generic drug:  insulin degludec Inject 12 Units into the skin daily at 10 pm.   Vitamin B-12 2500 MCG Subl Take 2,500 mcg by mouth daily.       Diagnostic Studies: Dg Knee Right Port  Result Date: 07/30/2017 CLINICAL DATA:  Status post right total knee replacement. EXAM: PORTABLE RIGHT KNEE - 1-2 VIEW COMPARISON:  None. FINDINGS: The femoral and tibial components appear to be well situated. Expected postoperative changes are noted in the soft tissues anteriorly. No fracture or dislocation is noted. IMPRESSION: Status post right total knee arthroplasty. Electronically Signed   By: Marijo Conception, M.D.   On: 07/30/2017 11:57    Disposition:   Discharge Instructions    Call MD / Call 911   Complete by:  As directed    If you experience chest pain or shortness of breath, CALL 911 and be transported to the hospital emergency room.  If you develope a fever above 101 F, pus ([place] drainage) or increased drainage or redness at the wound, or calf pain, call your surgeon's office.   Constipation Prevention    Complete by:  As directed    Drink plenty of fluids.  Prune juice may be helpful.  You may use a stool softener, such as Colace (over the counter) 100 mg twice a day.  Use MiraLax (over the counter) for constipation as needed.   Diet - low sodium heart healthy   Complete by:  As directed    Increase activity slowly as tolerated   Complete by:  As directed       Follow-up Information    Rod Can, MD.  Schedule an appointment as soon as possible for a visit in 2 weeks.   Specialty:  Orthopedic Surgery Why:  For wound re-check Contact information: [address] [place] [address] [phone]            Signed: Hilton Cork Kamarri Fischetti 08/03/2017, 3:33 PM

## 2017-07-31 NOTE — Progress Notes (Signed)
Physical Therapy Treatment Patient Details Name: Teresa Golden MRN: [id number] DOB: 02-05-1946 Today's Date: 07/31/2017    History of Present Illness 72 yo female s/p R TKA 07/30/17    PT Comments    Progressing slowly with mobility. Min encouragement required to progress ambulation distance.    Follow Up Recommendations  Follow surgeon's recommendation for DC plan and follow-up therapies     Equipment Recommendations  Rolling walker with 5" wheels    Recommendations for Other Services       Precautions / Restrictions Precautions Precautions: Fall Restrictions Weight Bearing Restrictions: No Other Position/Activity Restrictions: WBAT    Mobility  Bed Mobility Overal bed mobility: Needs Assistance Bed Mobility: Sit to Supine       Sit to supine: Min assist;HOB elevated   General bed mobility comments: Assist for R LE. Increased time. Pt relied on bedrail.   Transfers Overall transfer level: Needs assistance Equipment used: Rolling walker (2 wheeled) Transfers: Sit to/from Stand Sit to Stand: Mod assist         General transfer comment: VCs safety, technique, hand/LE placement. Assist to rise, stabilize, control descent.   Ambulation/Gait   Ambulation Distance (Feet): 20 Feet(x2) Assistive device: Rolling walker (2 wheeled) Gait Pattern/deviations: Step-to pattern;Decreased stance time - right;Antalgic;Trunk flexed     General Gait Details: VCs safety, technique, sequence, posture. Assist to stabilize pt throughout distance. Followed with recliner. Pt fatigues easily. She also c/o some dizziness. 1 seated rest break.    Stairs             Wheelchair Mobility    Modified Rankin (Stroke Patients Only)       Balance                                            Cognition Arousal/Alertness: Awake/alert Behavior During Therapy: WFL for tasks assessed/performed Overall Cognitive Status: Within Functional Limits for tasks  assessed                                        Exercises      General Comments        Pertinent Vitals/Pain Pain Assessment: 0-10 Pain Score: 7  Pain Location: R knee Pain Descriptors / Indicators: Aching;Sore Pain Intervention(s): Limited activity within patient's tolerance;Repositioned;Ice applied    Home Living                      Prior Function            PT Goals (current goals can now be found in the care plan section) Progress towards PT goals: Progressing toward goals(slowly)    Frequency    7X/week      PT Plan Current plan remains appropriate    Co-evaluation              AM-PAC PT "6 Clicks" Daily Activity  Outcome Measure  Difficulty turning over in bed (including adjusting bedclothes, sheets and blankets)?: A Lot Difficulty moving from lying on back to sitting on the side of the bed? : Unable Difficulty sitting down on and standing up from a chair with arms (e.g., wheelchair, bedside commode, etc,.)?: Unable Help needed moving to and from a bed to chair (including a wheelchair)?: A Lot Help needed walking in  hospital room?: A Lot Help needed climbing 3-5 steps with a railing? : Total 6 Click Score: 9    End of Session Equipment Utilized During Treatment: Gait belt Activity Tolerance: Patient limited by fatigue;Patient limited by pain Patient left: in bed;with call bell/phone within reach;with bed alarm set   PT Visit Diagnosis: Difficulty in walking, not elsewhere classified (R26.2);Pain Pain - Right/Left: Right Pain - part of body: Knee     Time: 0511-0211 PT Time Calculation (min) (ACUTE ONLY): 30 min  Charges:  $Gait Training: 23-37 mins                    G Codes:          Weston Anna, MPT Pager: [phone]

## 2017-07-31 NOTE — Progress Notes (Addendum)
    Subjective:  Patient reports pain as mild to moderate.  Denies N/V/CP/SOB. No c/o.  Objective:   VITALS:   Vitals:   07/31/17 0444 07/31/17 0611 07/31/17 0900 07/31/17 0951  BP: (!) 190/75 (!) 169/65 (!) 179/68 (!) 176/66  Pulse: 74 77 76 75  Resp: 16  16 16   Temp: 98.1 F (36.7 C)  98.3 F (36.8 C) 98.8 F (37.1 C)  TempSrc: Oral  Oral Oral  SpO2: 97% 97%  98%  Weight:      Height:        NAD ABD soft Sensation intact distally Intact pulses distally Dorsiflexion/Plantar flexion intact Incision: dressing C/D/I Compartment soft   Lab Results  Component Value Date   WBC 8.7 07/31/2017   HGB 9.9 (L) 07/31/2017   HCT 29.7 (L) 07/31/2017   MCV 88.7 07/31/2017   PLT 163 07/31/2017   BMET    Component Value Date/Time   NA 141 07/31/2017 0526   K 3.7 07/31/2017 0526   CL 107 07/31/2017 0526   CO2 25 07/31/2017 0526   GLUCOSE 157 (H) 07/31/2017 0526   BUN 14 07/31/2017 0526   CREATININE 0.98 07/31/2017 0526   CALCIUM 8.2 (L) 07/31/2017 0526   GFRNONAA 56 (L) 07/31/2017 0526   GFRAA >60 07/31/2017 0526    Anticipated LOS equal to or greater than 2 midnights due to - Age 8 and older with one or more of the following:  - Obesity  - Expected need for hospital services (PT, OT, Nursing) required for safe  discharge  - Anticipated need for postoperative skilled nursing care or inpatient rehab  - Active co-morbidities: Anemia OR   - Unanticipated findings during/Post Surgery: None  - Patient is a high risk of re-admission due to: None    Assessment/Plan: 1 Day Post-Op   Principal Problem:   Osteoarthritis of right knee   WBAT with walker DVT ppx: Aspirin, SCDs, TEDS PO pain control PT/OT Dispo: D/c home tomorrow with outpatient PT   Hilton Cork [place] 07/31/2017, 1:01 PM   Rod Can, MD Cell [phone]

## 2017-08-01 LAB — CBC
HEMATOCRIT: 26.5 % — AB (ref 36.0–46.0)
Hemoglobin: 8.9 g/dL — ABNORMAL LOW (ref 12.0–15.0)
MCH: 29.8 pg (ref 26.0–34.0)
MCHC: 33.6 g/dL (ref 30.0–36.0)
MCV: 88.6 fL (ref 78.0–100.0)
Platelets: 168 10*3/uL (ref 150–400)
RBC: 2.99 MIL/uL — ABNORMAL LOW (ref 3.87–5.11)
RDW: 14.2 % (ref 11.5–15.5)
WBC: 10 10*3/uL (ref 4.0–10.5)

## 2017-08-01 LAB — GLUCOSE, CAPILLARY
GLUCOSE-CAPILLARY: 165 mg/dL — AB (ref 65–99)
Glucose-Capillary: 157 mg/dL — ABNORMAL HIGH (ref 65–99)

## 2017-08-01 NOTE — Evaluation (Signed)
Occupational Therapy Evaluation Patient Details Name: Teresa Golden MRN: [id number] DOB: 02-23-1946 Today's Date: 08/01/2017    History of Present Illness 72 yo female s/p R TKA 07/30/17   Clinical Impression   Pt is s/p TKA resulting in the deficits listed below (see OT Problem List).  Pt will benefit from skilled OT to increase their safety and independence with ADL and functional mobility for ADL to facilitate discharge to venue listed below.        Follow Up Recommendations  Home health OT;Supervision/Assistance - 24 hour    Equipment Recommendations  None recommended by OT    Recommendations for Other Services       Precautions / Restrictions Precautions Precautions: Fall Restrictions Weight Bearing Restrictions: No Other Position/Activity Restrictions: WBAT      Mobility Bed Mobility Overal bed mobility: Needs Assistance Bed Mobility: Supine to Sit;Sit to Supine     Supine to sit: Min assist;HOB elevated Sit to supine: Min assist;HOB elevated   General bed mobility comments: pt sitting EOB  Transfers Overall transfer level: Needs assistance Equipment used: Rolling walker (2 wheeled) Transfers: Sit to/from [facility] Sit to Stand: Min guard Stand pivot transfers: Min guard       General transfer comment: cues for safety and technique    Balance Overall balance assessment: Needs assistance Sitting-balance support: Bilateral upper extremity supported;Feet supported Sitting balance-Leahy Scale: Good     Standing balance support: Bilateral upper extremity supported;During functional activity Standing balance-Leahy Scale: Poor                             ADL either performed or assessed with clinical judgement   ADL Overall ADL's : Needs assistance/impaired Eating/Feeding: Set up;Sitting   Grooming: Set up;Sitting   Upper Body Bathing: Set up;Sitting   Lower Body Bathing: Minimal assistance;Sit to/from stand;Cueing for  sequencing;Cueing for safety   Upper Body Dressing : Set up;Sitting   Lower Body Dressing: Minimal assistance;Sit to/from stand;Cueing for sequencing;Cueing for safety   Toilet Transfer: Minimal assistance;Ambulation;RW;Comfort height toilet   Toileting- Clothing Manipulation and Hygiene: Sit to/from stand;Cueing for sequencing;Cueing for safety;Min guard     Tub/Shower Transfer Details (indicate cue type and reason): pt not able to perform transfer but OT went over in detail with pt and brother. Pt verbalized understanding Functional mobility during ADLs: Minimal assistance General ADL Comments: Brother will A as needed     Vision Patient Visual Report: No change from baseline       Perception     Praxis      Pertinent Vitals/Pain Pain Assessment: 0-10 Pain Score: 3  Pain Location: R knee Pain Descriptors / Indicators: Aching;Sore Pain Intervention(s): Limited activity within patient's tolerance;Monitored during session     Hand Dominance     Extremity/Trunk Assessment         Cervical / Trunk Assessment Cervical / Trunk Assessment: Normal   Communication Communication Communication: No difficulties   Cognition Arousal/Alertness: Awake/alert Behavior During Therapy: WFL for tasks assessed/performed Overall Cognitive Status: Within Functional Limits for tasks assessed                                                Home Living Family/patient expects to be discharged to:: Private residence Living Arrangements: Alone Available Help at Discharge: Friend(s);Available PRN/intermittently Type of  Home: Apartment Home Access: Level entry;Ramped entrance     Home Layout: One level               Home Equipment: [place] - 4 wheels;Cane - single point          Prior Functioning/Environment Level of Independence: Independent with assistive device(s)                 OT Problem List: Decreased activity tolerance;Impaired balance  (sitting and/or standing);Decreased safety awareness;Decreased knowledge of use of DME or AE      OT Treatment/Interventions: Self-care/ADL training;Patient/family education;DME and/or AE instruction    OT Goals(Current goals can be found in the care plan section) Acute Rehab OT Goals Patient Stated Goal: regain independence. return to water aerobics OT Goal Formulation: With patient Time For Goal Achievement: 08/15/17 Potential to Achieve Goals: Good  OT Frequency: Min 2X/week              AM-PAC PT "6 Clicks" Daily Activity     Outcome Measure Help from another person eating meals?: None Help from another person taking care of personal grooming?: None Help from another person toileting, which includes using toliet, bedpan, or urinal?: A Little Help from another person bathing (including washing, rinsing, drying)?: A Little Help from another person to put on and taking off regular upper body clothing?: None Help from another person to put on and taking off regular lower body clothing?: A Little 6 Click Score: 21   End of Session Equipment Utilized During Treatment: Rolling walker Nurse Communication: Mobility status  Activity Tolerance: Patient limited by fatigue Patient left: Other (comment)(sitting EOB)  OT Visit Diagnosis: Unsteadiness on feet (R26.81);Muscle weakness (generalized) (M62.81)                Time: 8937-3428 OT Time Calculation (min): 10 min Charges:  OT General Charges $OT Visit: 1 Visit OT Evaluation $OT Eval Low Complexity: 1 Low G-Codes:     Kari Baars, [place]  Payton Mccallum D 08/01/2017, 12:47 PM

## 2017-08-01 NOTE — Progress Notes (Signed)
PT Cancellation Note  Patient Details Name: Teresa Golden MRN: [id number] DOB: 05-Nov-1945   Cancelled Treatment:    Reason Eval/Treat Not Completed: Other (comment) Attempted second treatment session of the day, however patient politely declines as she is being discharged and about to leave this facility, RN confirms.    Deniece Ree PT, DPT, CBIS  Supplemental Physical Therapist [facility]   Pager [phone]

## 2017-08-01 NOTE — Progress Notes (Signed)
Subjective: 2 Days Post-Op Procedure(s) (LRB): RIGHT TOTAL KNEE ARTHROPLASTY WITH COMPUTER NAVIGATION (Right)  Patient reports pain as mild to moderate.  Tolerating POs well.  Admits to flatus.  Notes that she has worked well with therapy and is ready to go home.  Objective:   VITALS:  Temp:  [98.5 F (36.9 C)-99.1 F (37.3 C)] 99.1 F (37.3 C) (06/08 0506) Pulse Rate:  [68-85] 76 (06/08 0506) Resp:  [12-16] 16 (06/08 0506) BP: (153-176)/(61-77) 153/61 (06/08 0506) SpO2:  [96 %-99 %] 99 % (06/08 0506)  General: WDWN patient in NAD. Psych:  Appropriate mood and affect. Neuro:  A&O x 3, Moving all extremities, sensation intact to light touch HEENT:  EOMs intact Chest:  Even non-labored respirations Skin:  Dressing C/D/I, no rashes or lesions Extremities: warm/dry, mild edema, no erythema or echymosis.  No lymphadenopathy. Pulses: Popliteus 2+ MSK:  ROM: lacks 5 degrees of TKE, MMT: able to perform quad set, (-) Homan's    LABS Recent Labs    07/31/17 0526 08/01/17 0540  HGB 9.9* 8.9*  WBC 8.7 10.0  PLT 163 168   Recent Labs    07/31/17 0526  NA 141  K 3.7  CL 107  CO2 25  BUN 14  CREATININE 0.98  GLUCOSE 157*   No results for input(s): LABPT, INR in the last 72 hours.   Assessment/Plan: 2 Days Post-Op Procedure(s) (LRB): RIGHT TOTAL KNEE ARTHROPLASTY WITH COMPUTER NAVIGATION (Right)  Patient seen in rounds for Dr. Lyla Glassing. WBAT R LE D/C home today with outpatient PT.  Scripts on chart. Plan for 2 week outpatient post-op visit with Dr. Lyla Glassing.  Mechele Claude PA-C [facility] Office:  [phone]

## 2017-08-01 NOTE — Progress Notes (Signed)
Physical Therapy Treatment Patient Details Name: Teresa Golden MRN: [id number] DOB: 1945-04-02 Today's Date: 08/01/2017    History of Present Illness 72 yo female s/p R TKA 07/30/17    PT Comments    Patient received in bed, pleasant and willing to participate in PT and reporting that she may go home today. She continues to require MinA for functional bed mobility, attempted to teach patient to use L LE to control/move R LE in bed but she does have difficulty with this technique. She requires ModA for functional transfers and was only able to gait train approximately 69f today, limited by fatigue and pain. She reports that she feels she will have necessary levels of assistance when she returns home. She was left in bed with all needs met and concerns/questions addressed this morning.     Follow Up Recommendations  Follow surgeon's recommendation for DC plan and follow-up therapies     Equipment Recommendations  Rolling walker with 5" wheels    Recommendations for Other Services       Precautions / Restrictions Precautions Precautions: Fall Restrictions Weight Bearing Restrictions: No Other Position/Activity Restrictions: WBAT    Mobility  Bed Mobility Overal bed mobility: Needs Assistance Bed Mobility: Supine to Sit;Sit to Supine     Supine to sit: Min assist;HOB elevated Sit to supine: Min assist;HOB elevated   General bed mobility comments: MinA for R LE management, attempted crossing L LE under R for improved mobility but patient with difficulty with this   Transfers Overall transfer level: Needs assistance Equipment used: Rolling walker (2 wheeled) Transfers: Sit to/from Stand Sit to Stand: Mod assist         General transfer comment: cues for safety and technique, boost to come to full stand with multiple attempts   Ambulation/Gait Ambulation/Gait assistance: Min guard Ambulation Distance (Feet): 20 Feet Assistive device: Rolling walker (2 wheeled) Gait  Pattern/deviations: Step-to pattern;Decreased step length - left;Decreased stance time - right;Decreased dorsiflexion - right;Decreased weight shift to right;Antalgic;Trunk flexed     General Gait Details: gait limited by pain today, patient declining second round of gait training; easily fatigued as well but balance appears to be improving as patient now able to ambulate with min guard    Stairs             Wheelchair Mobility    Modified Rankin (Stroke Patients Only)       Balance Overall balance assessment: Needs assistance Sitting-balance support: Bilateral upper extremity supported;Feet supported Sitting balance-Leahy Scale: Good     Standing balance support: Bilateral upper extremity supported;During functional activity Standing balance-Leahy Scale: Poor                              Cognition Arousal/Alertness: Awake/alert Behavior During Therapy: WFL for tasks assessed/performed Overall Cognitive Status: Within Functional Limits for tasks assessed                                        Exercises Total Joint Exercises Goniometric ROM: R knee AROM 16 degrees extension to 45 degrees flexion     General Comments        Pertinent Vitals/Pain Pain Assessment: 0-10 Pain Score: 3  Pain Location: R knee Pain Descriptors / Indicators: Aching;Sore Pain Intervention(s): Limited activity within patient's tolerance;Monitored during session;Premedicated before session    Home Living  Prior Function            PT Goals (current goals can now be found in the care plan section) Acute Rehab PT Goals Patient Stated Goal: regain independence. return to water aerobics PT Goal Formulation: With patient Time For Goal Achievement: 08/13/17 Potential to Achieve Goals: Good Progress towards PT goals: Progressing toward goals(very slow progress )    Frequency    7X/week      PT Plan Current plan remains  appropriate    Co-evaluation              AM-PAC PT "6 Clicks" Daily Activity  Outcome Measure  Difficulty turning over in bed (including adjusting bedclothes, sheets and blankets)?: A Lot Difficulty moving from lying on back to sitting on the side of the bed? : A Lot Difficulty sitting down on and standing up from a chair with arms (e.g., wheelchair, bedside commode, etc,.)?: A Lot Help needed moving to and from a bed to chair (including a wheelchair)?: A Lot Help needed walking in hospital room?: A Little Help needed climbing 3-5 steps with a railing? : Total 6 Click Score: 12    End of Session Equipment Utilized During Treatment: Gait belt Activity Tolerance: Patient limited by fatigue;Patient limited by pain Patient left: in bed;with call bell/phone within reach   PT Visit Diagnosis: Difficulty in walking, not elsewhere classified (R26.2);Pain Pain - Right/Left: Right Pain - part of body: Knee     Time: 8022-1798 PT Time Calculation (min) (ACUTE ONLY): 23 min  Charges:  $Gait Training: 8-22 mins $Therapeutic Activity: 8-22 mins                    G Codes:       Deniece Ree PT, DPT, CBIS  Supplemental Physical Therapist [place]   Pager [phone]

## 2017-08-01 NOTE — Care Management Note (Signed)
Case Management Note  Patient Details  Name: Teresa Golden MRN: [id number] Date of Birth: 07/22/1945  Subjective/Objective:   Right TKA                 Action/Plan: NCM spoke to pt at bedside. Pt arranged for OPPT. Contacted [facility] for RW and 3n1 to be delivered to room prior to dc.    Expected Discharge Date:  08/01/17               Expected Discharge Plan:  OP Rehab  In-House Referral:  NA  Discharge planning Services  CM Consult  Post Acute Care Choice:  NA Choice offered to:  NA  DME Arranged:  3-N-1, [place] rolling DME Agency:  [place] Arranged:  NA [place] Agency:  NA  Status of Service:  Completed, signed off  If discussed at [place] of Stay Meetings, dates discussed:    Additional Comments:  Erenest Rasher, RN 08/01/2017, 12:40 PM

## 2017-08-03 DIAGNOSIS — I10 Essential (primary) hypertension: Secondary | ICD-10-CM | POA: Diagnosis not present

## 2017-08-03 DIAGNOSIS — E119 Type 2 diabetes mellitus without complications: Secondary | ICD-10-CM | POA: Diagnosis not present

## 2017-08-03 DIAGNOSIS — Z471 Aftercare following joint replacement surgery: Secondary | ICD-10-CM | POA: Diagnosis not present

## 2017-08-03 DIAGNOSIS — Z96653 Presence of artificial knee joint, bilateral: Secondary | ICD-10-CM | POA: Diagnosis not present

## 2017-08-05 DIAGNOSIS — E119 Type 2 diabetes mellitus without complications: Secondary | ICD-10-CM | POA: Diagnosis not present

## 2017-08-05 DIAGNOSIS — I10 Essential (primary) hypertension: Secondary | ICD-10-CM | POA: Diagnosis not present

## 2017-08-05 DIAGNOSIS — Z96653 Presence of artificial knee joint, bilateral: Secondary | ICD-10-CM | POA: Diagnosis not present

## 2017-08-05 DIAGNOSIS — Z471 Aftercare following joint replacement surgery: Secondary | ICD-10-CM | POA: Diagnosis not present

## 2017-08-06 DIAGNOSIS — E119 Type 2 diabetes mellitus without complications: Secondary | ICD-10-CM | POA: Diagnosis not present

## 2017-08-06 DIAGNOSIS — I10 Essential (primary) hypertension: Secondary | ICD-10-CM | POA: Diagnosis not present

## 2017-08-06 DIAGNOSIS — Z471 Aftercare following joint replacement surgery: Secondary | ICD-10-CM | POA: Diagnosis not present

## 2017-08-06 DIAGNOSIS — Z96653 Presence of artificial knee joint, bilateral: Secondary | ICD-10-CM | POA: Diagnosis not present

## 2017-08-07 ENCOUNTER — Emergency Department (HOSPITAL_COMMUNITY): Payer: Medicare HMO

## 2017-08-07 ENCOUNTER — Encounter (HOSPITAL_COMMUNITY): Payer: Self-pay | Admitting: Emergency Medicine

## 2017-08-07 ENCOUNTER — Emergency Department (HOSPITAL_COMMUNITY)
Admission: EM | Admit: 2017-08-07 | Discharge: 2017-08-08 | Disposition: A | Discharge status: Home / Self Care | Payer: Medicare HMO | Attending: Emergency Medicine | Admitting: Emergency Medicine

## 2017-08-07 DIAGNOSIS — N183 Chronic kidney disease, stage 3 (moderate): Secondary | ICD-10-CM | POA: Insufficient documentation

## 2017-08-07 DIAGNOSIS — E039 Hypothyroidism, unspecified: Secondary | ICD-10-CM | POA: Insufficient documentation

## 2017-08-07 DIAGNOSIS — E1122 Type 2 diabetes mellitus with diabetic chronic kidney disease: Secondary | ICD-10-CM | POA: Diagnosis not present

## 2017-08-07 DIAGNOSIS — M25561 Pain in right knee: Secondary | ICD-10-CM | POA: Diagnosis not present

## 2017-08-07 DIAGNOSIS — Z96652 Presence of left artificial knee joint: Secondary | ICD-10-CM | POA: Diagnosis not present

## 2017-08-07 DIAGNOSIS — Z7982 Long term (current) use of aspirin: Secondary | ICD-10-CM | POA: Insufficient documentation

## 2017-08-07 DIAGNOSIS — I129 Hypertensive chronic kidney disease with stage 1 through stage 4 chronic kidney disease, or unspecified chronic kidney disease: Secondary | ICD-10-CM | POA: Diagnosis not present

## 2017-08-07 DIAGNOSIS — W19XXXA Unspecified fall, initial encounter: Secondary | ICD-10-CM

## 2017-08-07 DIAGNOSIS — T1490XA Injury, unspecified, initial encounter: Secondary | ICD-10-CM | POA: Diagnosis not present

## 2017-08-07 DIAGNOSIS — Z87891 Personal history of nicotine dependence: Secondary | ICD-10-CM | POA: Diagnosis not present

## 2017-08-07 DIAGNOSIS — W010XXA Fall on same level from slipping, tripping and stumbling without subsequent striking against object, initial encounter: Secondary | ICD-10-CM | POA: Diagnosis not present

## 2017-08-07 DIAGNOSIS — S8991XA Unspecified injury of right lower leg, initial encounter: Secondary | ICD-10-CM | POA: Diagnosis not present

## 2017-08-07 DIAGNOSIS — Z794 Long term (current) use of insulin: Secondary | ICD-10-CM | POA: Diagnosis not present

## 2017-08-07 DIAGNOSIS — R69 Illness, unspecified: Secondary | ICD-10-CM | POA: Diagnosis not present

## 2017-08-07 DIAGNOSIS — Z79899 Other long term (current) drug therapy: Secondary | ICD-10-CM | POA: Diagnosis not present

## 2017-08-07 DIAGNOSIS — I10 Essential (primary) hypertension: Secondary | ICD-10-CM | POA: Diagnosis not present

## 2017-08-07 NOTE — ED Notes (Signed)
Patient agrees to discharge but unable to physically sign for discharge.

## 2017-08-07 NOTE — Discharge Instructions (Addendum)
Return here as needed.  The x-rays did not show any signs of significant abnormality.  Follow-up with your orthopedist.  Ice and elevate your knee.  I would also advised taking MiraLAX as well.

## 2017-08-07 NOTE — ED Notes (Signed)
Bed: [facility] Expected date:  Expected time:  Means of arrival:  Comments: EMS-fall 

## 2017-08-07 NOTE — ED Provider Notes (Signed)
[facility] DEPT Provider Note   CSN: [id number] Arrival date & time: 08/07/17  1824     History   Chief Complaint Chief Complaint  Patient presents with  . Fall  . Knee Pain    HPI Teresa Golden is a 72 y.o. female.  HPI Patient presents to the emergency department with right knee pain following a fall that occurred just prior to arrival.  The patient states she had a knee replacement last week and she states that she felt like her knee had some discomfort and gave way.  The patient states that she landed on the knee.  Patient states that she did not take her pain medications today and she feels that may have caused her to feel poorly today.  Patient states she was concerned because she was not having bowel movements and that so she stopped the medications.  The patient denies chest pain, shortness of breath, headache,blurred vision, neck pain, fever, cough, weakness, numbness, dizziness, anorexia, edema, abdominal pain, nausea, vomiting, diarrhea, rash, back pain, dysuria, hematemesis, bloody stool, near syncope, or syncope. Past Medical History:  Diagnosis Date  . Arthritis   . Chronic kidney disease    self reports ckd stage 3   . Diabetes mellitus, type II, insulin dependent ([place])    With neurologic complications. Bilateral lower extremity peripheral neuropathy  . Dyspnea    with excertion; no issues now since weight loss surgery   . Endometriosis   . Essential hypertension   . GERD (gastroesophageal reflux disease)   . 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 Hypothyroidism   . Morbid obesity with BMI of 50.0-59.9, adult ([place])   . Scoliosis     Patient Active Problem List   Diagnosis Date Noted  . Osteoarthritis of right knee 07/30/2017  . S/P laparoscopic sleeve gastrectomy July 2018 08/26/2016  . Controlled type 1 diabetes mellitus with complication, with long-term current use of insulin ([place])   .  Preop cardiovascular exam 07/30/2016  . Dyspnea on exertion 07/30/2016  . Morbid obesity ([place]) 07/30/2016  . Bilateral lower extremity edema 07/30/2016    Past Surgical History:  Procedure Laterality Date  . ABDOMINAL HYSTERECTOMY     uterus  . CHOLECYSTECTOMY    . EYE SURGERY     cataract extraction bilateral   . JOINT REPLACEMENT     Left knee  . KNEE ARTHROPLASTY Right 07/30/2017   Procedure: RIGHT TOTAL KNEE ARTHROPLASTY WITH COMPUTER NAVIGATION;  Surgeon: Rod Can, MD;  Location: [facility] ORS;  Service: Orthopedics;  Laterality: Right;  Needs RNFA  . LAPAROSCOPIC GASTRIC BANDING     and reversal  . LAPAROSCOPIC GASTRIC SLEEVE RESECTION N/A 08/26/2016   Procedure: LAPAROSCOPIC GASTRIC SLEEVE RESECTION WITH UPPER ENOD;  Surgeon: Johnathan Hausen, MD;  Location: [facility] ORS;  Service: General;  Laterality: N/A;  . TONSILLECTOMY    . TRANSTHORACIC ECHOCARDIOGRAM  11/2013   EF 65-70%. Normal diastolic Fxn.  Normal Valves.     OB History   None      Home Medications    Prior to Admission medications   Medication Sig Start Date End Date Taking? Authorizing Provider  aspirin 81 MG chewable tablet Chew 1 tablet (81 mg total) by mouth 2 (two) times daily. 07/31/17  Yes Swinteck, Aaron Edelman, MD  B Complex-C (B-COMPLEX WITH VITAMIN C) tablet Take 1 tablet by mouth daily.   Yes [provider]  carvedilol (COREG CR)  20 MG 24 hr capsule Take 20 mg by mouth daily.  08/02/16  Yes [provider]  Cholecalciferol 5000 units TABS Take 5,000 Units by mouth daily.   Yes [provider]  Cyanocobalamin (VITAMIN B-12) 2500 MCG SUBL Take 2,500 mcg by mouth daily.   Yes [provider]  diclofenac sodium (VOLTAREN) 1 % GEL Apply 2 g topically at bedtime. Applied to feet at bedtime for neuropathy pain   Yes [provider]  docusate sodium (COLACE) 100 MG capsule Take 1 capsule (100 mg total) by mouth 2 (two) times daily. Patient taking differently: Take 100 mg by  mouth 2 (two) times daily as needed for mild constipation.  07/31/17  Yes Swinteck, Aaron Edelman, MD  gabapentin (NEURONTIN) 300 MG capsule Take 300 mg by mouth at bedtime.  08/12/16  Yes [provider]  HYDROcodone-acetaminophen (NORCO/VICODIN) 5-325 MG tablet Take 1-2 tablets by mouth every 6 (six) hours as needed (postop knee pain). 07/31/17  Yes Swinteck, Aaron Edelman, MD  insulin degludec (TRESIBA FLEXTOUCH) 100 UNIT/ML SOPN FlexTouch Pen Inject 12 Units into the skin daily at 10 pm.    Yes [provider]  levothyroxine (SYNTHROID, LEVOTHROID) 25 MCG tablet Take 12.5-25 mcg by mouth See admin instructions. Take 0.5 tablet (12.5 mcg) by mouth on Mondays through Fridays, then take 1 tablet (25 mcg) by mouth on Saturdays and Sundays.   Yes [provider]  losartan (COZAAR) 25 MG tablet Take 25 mg by mouth daily. 06/06/17  Yes [provider]  Multiple Vitamins-Minerals (HAIR/SKIN/NAILS/BIOTIN PO) Take 1 tablet by mouth daily.   Yes [provider]  Multiple Vitamins-Minerals (MULTIVITAMIN WITH MINERALS) tablet Take 1 tablet by mouth daily. Women's 50+   Yes [provider]  naproxen sodium (ANAPROX) 220 MG tablet Take 440 mg by mouth 2 (two) times daily as needed (pain).    Yes [provider]  ondansetron (ZOFRAN) 4 MG tablet Take 1 tablet (4 mg total) by mouth every 6 (six) hours as needed for nausea. 07/31/17  Yes Swinteck, Aaron Edelman, MD  colchicine 0.6 MG tablet Take 1 tablet (0.6 mg total) by mouth daily. Patient taking differently: Take 0.6 mg by mouth daily as needed (FOR FLAREUPS).  09/28/14   Tanna Furry, MD  OZEMPIC 0.25 or 0.5 MG/DOSE SOPN Inject 0.5 mg into the skin every Saturday at 6 PM. 06/22/17   [provider]  senna (SENOKOT) 8.6 MG TABS tablet Take 1 tablet (8.6 mg total) by mouth 2 (two) times daily. Patient taking differently: Take 1 tablet by mouth 2 (two) times daily as needed for mild constipation.  07/31/17   Rod Can, MD     Family History Family History  Problem Relation Age of Onset  . Heart attack Mother   . Heart failure Mother   . Stroke Father     Social History Social History   Tobacco Use  . Smoking status: Former Smoker    Types: Cigarettes  . Smokeless tobacco: Never Used  . Tobacco comment: quit 20 years ago  Substance Use Topics  . Alcohol use: Yes    Alcohol/week: 0.6 oz    Types: 1 Glasses of wine per week    Comment: occasional  . Drug use: No     Allergies   Meloxicam and Other   Review of Systems Review of Systems All other systems negative except as documented in the HPI. All pertinent positives and negatives as reviewed in the HPI.  Physical Exam Updated Vital Signs BP [name])  166/71   Pulse 74   Temp 98.4 F (36.9 C) (Oral)   Resp 18   SpO2 98%   Physical Exam  Constitutional: She is oriented to person, place, and time. She appears well-developed and well-nourished. No distress.  HENT:  Head: Normocephalic and atraumatic.  Mouth/Throat: Oropharynx is clear and moist.  Eyes: Pupils are equal, round, and reactive to light.  Neck: Normal range of motion. Neck supple.  Cardiovascular: Normal rate, regular rhythm and normal heart sounds. Exam reveals no gallop and no friction rub.  No murmur heard. Pulmonary/Chest: Effort normal and breath sounds normal. No respiratory distress. She has no wheezes.  Musculoskeletal:       Right knee: She exhibits decreased range of motion and swelling. She exhibits no effusion, no ecchymosis, no deformity and no laceration.       Legs: Neurological: She is alert and oriented to person, place, and time. She exhibits normal muscle tone. Coordination normal.  Skin: Skin is warm and dry. Capillary refill takes less than 2 seconds. No rash noted. No erythema.  Psychiatric: She has a normal mood and affect. Her behavior is normal.  Nursing note and vitals reviewed.    ED Treatments / Results  Labs (all labs ordered are listed,  but only abnormal results are displayed) Labs Reviewed - No data to display  EKG None  Radiology Dg Knee Complete 4 Views Right  Result Date: 08/07/2017 CLINICAL DATA:  Right knee surgery with fall and pain at the medial aspect EXAM: RIGHT KNEE - COMPLETE 4+ VIEW COMPARISON:  07/30/2017 FINDINGS: Status post right knee replacement without acute fracture or malalignment. Intact hardware. Small moderate knee effusion with decreased soft tissue gas. IMPRESSION: Status post right knee replacement with normal alignment and no definitive acute osseous abnormality. Small moderate knee effusion with decreased soft tissue gas since prior radiograph. Electronically Signed   By: Donavan Foil M.D.   On: 08/07/2017 21:00    Procedures Procedures (including critical care time)  Medications Ordered in ED Medications - No data to display   Initial Impression / Assessment and Plan / ED Course  I have reviewed the triage vital signs and the nursing notes.  Pertinent labs & imaging results that were available during my care of the patient were reviewed by me and considered in my medical decision making (see chart for details).     Patient be discharged home and advised to follow-up with her orthopedist as soon as possible.  Have advised her to ice and elevate her knee.  Told to return here for any worsening in her condition.  Final Clinical Impressions(s) / ED Diagnoses   Final diagnoses:  None    ED Discharge Orders    None       Rebeca Allegra 08/07/17 2213    Hayden Rasmussen, MD 08/08/17 1049

## 2017-08-07 NOTE — ED Triage Notes (Signed)
Per EMS-states patient recently had right knee surgery-patient fell today on her right knee-admit to drinking alcohol which might be the cause of fall-took Aleve around 5 pm-has also been taking [place]

## 2017-08-08 DIAGNOSIS — M2550 Pain in unspecified joint: Secondary | ICD-10-CM | POA: Diagnosis not present

## 2017-08-08 DIAGNOSIS — Z7401 Bed confinement status: Secondary | ICD-10-CM | POA: Diagnosis not present

## 2017-08-10 DIAGNOSIS — Z96653 Presence of artificial knee joint, bilateral: Secondary | ICD-10-CM | POA: Diagnosis not present

## 2017-08-10 DIAGNOSIS — I10 Essential (primary) hypertension: Secondary | ICD-10-CM | POA: Diagnosis not present

## 2017-08-10 DIAGNOSIS — E119 Type 2 diabetes mellitus without complications: Secondary | ICD-10-CM | POA: Diagnosis not present

## 2017-08-10 DIAGNOSIS — Z471 Aftercare following joint replacement surgery: Secondary | ICD-10-CM | POA: Diagnosis not present

## 2017-08-11 DIAGNOSIS — I10 Essential (primary) hypertension: Secondary | ICD-10-CM | POA: Diagnosis not present

## 2017-08-11 DIAGNOSIS — Z96653 Presence of artificial knee joint, bilateral: Secondary | ICD-10-CM | POA: Diagnosis not present

## 2017-08-11 DIAGNOSIS — E119 Type 2 diabetes mellitus without complications: Secondary | ICD-10-CM | POA: Diagnosis not present

## 2017-08-11 DIAGNOSIS — Z471 Aftercare following joint replacement surgery: Secondary | ICD-10-CM | POA: Diagnosis not present

## 2017-08-13 DIAGNOSIS — Z96653 Presence of artificial knee joint, bilateral: Secondary | ICD-10-CM | POA: Diagnosis not present

## 2017-08-13 DIAGNOSIS — I10 Essential (primary) hypertension: Secondary | ICD-10-CM | POA: Diagnosis not present

## 2017-08-13 DIAGNOSIS — Z471 Aftercare following joint replacement surgery: Secondary | ICD-10-CM | POA: Diagnosis not present

## 2017-08-13 DIAGNOSIS — E119 Type 2 diabetes mellitus without complications: Secondary | ICD-10-CM | POA: Diagnosis not present

## 2017-08-17 DIAGNOSIS — M25561 Pain in right knee: Secondary | ICD-10-CM | POA: Diagnosis not present

## 2017-08-19 DIAGNOSIS — M25561 Pain in right knee: Secondary | ICD-10-CM | POA: Diagnosis not present

## 2017-08-21 DIAGNOSIS — R69 Illness, unspecified: Secondary | ICD-10-CM | POA: Diagnosis not present

## 2017-08-24 DIAGNOSIS — M25561 Pain in right knee: Secondary | ICD-10-CM | POA: Diagnosis not present

## 2017-08-26 DIAGNOSIS — M25561 Pain in right knee: Secondary | ICD-10-CM | POA: Diagnosis not present

## 2017-09-01 DIAGNOSIS — M25561 Pain in right knee: Secondary | ICD-10-CM | POA: Diagnosis not present

## 2017-09-03 DIAGNOSIS — M25561 Pain in right knee: Secondary | ICD-10-CM | POA: Diagnosis not present

## 2017-09-08 DIAGNOSIS — Z96651 Presence of right artificial knee joint: Secondary | ICD-10-CM | POA: Diagnosis not present

## 2017-09-08 DIAGNOSIS — Z471 Aftercare following joint replacement surgery: Secondary | ICD-10-CM | POA: Diagnosis not present

## 2017-09-09 DIAGNOSIS — N08 Glomerular disorders in diseases classified elsewhere: Secondary | ICD-10-CM | POA: Diagnosis not present

## 2017-09-09 DIAGNOSIS — E039 Hypothyroidism, unspecified: Secondary | ICD-10-CM | POA: Diagnosis not present

## 2017-09-09 DIAGNOSIS — N183 Chronic kidney disease, stage 3 (moderate): Secondary | ICD-10-CM | POA: Diagnosis not present

## 2017-09-09 DIAGNOSIS — E1122 Type 2 diabetes mellitus with diabetic chronic kidney disease: Secondary | ICD-10-CM | POA: Diagnosis not present

## 2017-09-09 DIAGNOSIS — D649 Anemia, unspecified: Secondary | ICD-10-CM | POA: Diagnosis not present

## 2017-09-10 DIAGNOSIS — M25561 Pain in right knee: Secondary | ICD-10-CM | POA: Diagnosis not present

## 2017-09-15 DIAGNOSIS — M25561 Pain in right knee: Secondary | ICD-10-CM | POA: Diagnosis not present

## 2017-09-16 DIAGNOSIS — L84 Corns and callosities: Secondary | ICD-10-CM | POA: Diagnosis not present

## 2017-09-16 DIAGNOSIS — B351 Tinea unguium: Secondary | ICD-10-CM | POA: Diagnosis not present

## 2017-09-16 DIAGNOSIS — E1351 Other specified diabetes mellitus with diabetic peripheral angiopathy without gangrene: Secondary | ICD-10-CM | POA: Diagnosis not present

## 2017-09-17 ENCOUNTER — Ambulatory Visit (INDEPENDENT_AMBULATORY_CARE_PROVIDER_SITE_OTHER): Payer: Medicare HMO | Admitting: Psychiatry

## 2017-09-17 DIAGNOSIS — R69 Illness, unspecified: Secondary | ICD-10-CM | POA: Diagnosis not present

## 2017-09-17 DIAGNOSIS — F509 Eating disorder, unspecified: Secondary | ICD-10-CM

## 2017-09-17 DIAGNOSIS — M25561 Pain in right knee: Secondary | ICD-10-CM | POA: Diagnosis not present

## 2017-09-17 DIAGNOSIS — F4323 Adjustment disorder with mixed anxiety and depressed mood: Secondary | ICD-10-CM

## 2017-09-21 DIAGNOSIS — M25561 Pain in right knee: Secondary | ICD-10-CM | POA: Diagnosis not present

## 2017-09-23 DIAGNOSIS — M25561 Pain in right knee: Secondary | ICD-10-CM | POA: Diagnosis not present

## 2017-09-24 ENCOUNTER — Ambulatory Visit (INDEPENDENT_AMBULATORY_CARE_PROVIDER_SITE_OTHER): Payer: Medicare HMO | Admitting: Psychiatry

## 2017-09-24 DIAGNOSIS — F509 Eating disorder, unspecified: Secondary | ICD-10-CM | POA: Diagnosis not present

## 2017-09-24 DIAGNOSIS — F4323 Adjustment disorder with mixed anxiety and depressed mood: Secondary | ICD-10-CM | POA: Diagnosis not present

## 2017-09-24 DIAGNOSIS — R69 Illness, unspecified: Secondary | ICD-10-CM | POA: Diagnosis not present

## 2017-09-28 DIAGNOSIS — M25561 Pain in right knee: Secondary | ICD-10-CM | POA: Diagnosis not present

## 2017-10-02 DIAGNOSIS — E119 Type 2 diabetes mellitus without complications: Secondary | ICD-10-CM | POA: Diagnosis not present

## 2017-10-05 DIAGNOSIS — M25561 Pain in right knee: Secondary | ICD-10-CM | POA: Diagnosis not present

## 2017-10-07 DIAGNOSIS — M25561 Pain in right knee: Secondary | ICD-10-CM | POA: Diagnosis not present

## 2017-10-08 ENCOUNTER — Ambulatory Visit: Payer: Medicare HMO | Admitting: Psychiatry

## 2017-10-12 DIAGNOSIS — M25561 Pain in right knee: Secondary | ICD-10-CM | POA: Diagnosis not present

## 2017-10-13 ENCOUNTER — Ambulatory Visit (INDEPENDENT_AMBULATORY_CARE_PROVIDER_SITE_OTHER): Payer: Medicare HMO | Admitting: Psychiatry

## 2017-10-13 DIAGNOSIS — R69 Illness, unspecified: Secondary | ICD-10-CM | POA: Diagnosis not present

## 2017-10-13 DIAGNOSIS — F509 Eating disorder, unspecified: Secondary | ICD-10-CM | POA: Diagnosis not present

## 2017-10-14 DIAGNOSIS — M25561 Pain in right knee: Secondary | ICD-10-CM | POA: Diagnosis not present

## 2017-10-22 ENCOUNTER — Ambulatory Visit: Payer: Medicare HMO | Admitting: Psychiatry

## 2017-10-27 ENCOUNTER — Ambulatory Visit (INDEPENDENT_AMBULATORY_CARE_PROVIDER_SITE_OTHER): Payer: Medicare HMO | Admitting: Psychiatry

## 2017-10-27 DIAGNOSIS — R69 Illness, unspecified: Secondary | ICD-10-CM | POA: Diagnosis not present

## 2017-10-27 DIAGNOSIS — F509 Eating disorder, unspecified: Secondary | ICD-10-CM

## 2017-11-03 ENCOUNTER — Ambulatory Visit (INDEPENDENT_AMBULATORY_CARE_PROVIDER_SITE_OTHER): Payer: Medicare HMO | Admitting: Psychiatry

## 2017-11-03 DIAGNOSIS — F509 Eating disorder, unspecified: Secondary | ICD-10-CM | POA: Diagnosis not present

## 2017-11-03 DIAGNOSIS — R69 Illness, unspecified: Secondary | ICD-10-CM | POA: Diagnosis not present

## 2017-11-03 DIAGNOSIS — F4323 Adjustment disorder with mixed anxiety and depressed mood: Secondary | ICD-10-CM

## 2017-11-12 ENCOUNTER — Other Ambulatory Visit: Payer: Self-pay | Admitting: Internal Medicine

## 2017-11-12 DIAGNOSIS — Z1231 Encounter for screening mammogram for malignant neoplasm of breast: Secondary | ICD-10-CM

## 2017-11-17 ENCOUNTER — Ambulatory Visit (INDEPENDENT_AMBULATORY_CARE_PROVIDER_SITE_OTHER): Payer: Medicare HMO | Admitting: Psychiatry

## 2017-11-17 DIAGNOSIS — F509 Eating disorder, unspecified: Secondary | ICD-10-CM

## 2017-11-17 DIAGNOSIS — F4323 Adjustment disorder with mixed anxiety and depressed mood: Secondary | ICD-10-CM

## 2017-11-17 DIAGNOSIS — R69 Illness, unspecified: Secondary | ICD-10-CM | POA: Diagnosis not present

## 2017-11-26 DIAGNOSIS — F509 Eating disorder, unspecified: Secondary | ICD-10-CM

## 2017-11-26 DIAGNOSIS — F4323 Adjustment disorder with mixed anxiety and depressed mood: Secondary | ICD-10-CM

## 2017-12-01 ENCOUNTER — Ambulatory Visit (INDEPENDENT_AMBULATORY_CARE_PROVIDER_SITE_OTHER): Payer: Medicare HMO | Admitting: Psychiatry

## 2017-12-01 DIAGNOSIS — F4323 Adjustment disorder with mixed anxiety and depressed mood: Secondary | ICD-10-CM

## 2017-12-01 DIAGNOSIS — F509 Eating disorder, unspecified: Secondary | ICD-10-CM

## 2017-12-01 DIAGNOSIS — R69 Illness, unspecified: Secondary | ICD-10-CM | POA: Diagnosis not present

## 2017-12-02 ENCOUNTER — Telehealth: Payer: Self-pay

## 2017-12-02 MED ORDER — INSULIN DEGLUDEC 100 UNIT/ML ~~LOC~~ SOPN: 12.0000 [IU] | PEN_INJECTOR | Freq: Every day | SUBCUTANEOUS | 0 refills | Status: DC

## 2017-12-02 NOTE — Telephone Encounter (Signed)
Left the pt a message that her sample of Teresa Golden  Is available for pickup.

## 2017-12-11 ENCOUNTER — Ambulatory Visit
Admission: RE | Admit: 2017-12-11 | Discharge: 2017-12-11 | Disposition: A | Discharge status: Home / Self Care | Payer: Medicare HMO | Source: Ambulatory Visit | Attending: Internal Medicine | Admitting: Internal Medicine

## 2017-12-11 DIAGNOSIS — Z1231 Encounter for screening mammogram for malignant neoplasm of breast: Secondary | ICD-10-CM

## 2017-12-15 ENCOUNTER — Telehealth: Payer: Self-pay

## 2017-12-15 ENCOUNTER — Other Ambulatory Visit: Payer: Self-pay

## 2017-12-15 ENCOUNTER — Ambulatory Visit (INDEPENDENT_AMBULATORY_CARE_PROVIDER_SITE_OTHER): Payer: Medicare HMO | Admitting: Psychiatry

## 2017-12-15 DIAGNOSIS — F509 Eating disorder, unspecified: Secondary | ICD-10-CM

## 2017-12-15 DIAGNOSIS — F4323 Adjustment disorder with mixed anxiety and depressed mood: Secondary | ICD-10-CM

## 2017-12-15 DIAGNOSIS — R69 Illness, unspecified: Secondary | ICD-10-CM | POA: Diagnosis not present

## 2017-12-15 MED ORDER — SIMVASTATIN 10 MG PO TABS: 10.0000 mg | ORAL_TABLET | Freq: Every day | ORAL | 1 refills | Status: DC

## 2017-12-15 NOTE — Telephone Encounter (Signed)
The patient was told that Dr. Baird Cancer said to resume her cholesterol medication and to only take it 3 days per week.

## 2017-12-15 NOTE — Telephone Encounter (Signed)
The patient was told that Dr. Baird Cancer wants her to resume her simvastatin and to take the medication on Mon,Wednes, Fri only and to have a f/u in 6 weeks.

## 2017-12-16 ENCOUNTER — Encounter: Payer: Self-pay | Admitting: Internal Medicine

## 2017-12-16 ENCOUNTER — Ambulatory Visit (INDEPENDENT_AMBULATORY_CARE_PROVIDER_SITE_OTHER): Payer: Medicare HMO | Admitting: Internal Medicine

## 2017-12-16 VITALS — BP 126/88 | HR 71 | Temp 97.9°F | Ht 65.0 in | Wt 224.4 lb

## 2017-12-16 DIAGNOSIS — E1122 Type 2 diabetes mellitus with diabetic chronic kidney disease: Secondary | ICD-10-CM

## 2017-12-16 DIAGNOSIS — I129 Hypertensive chronic kidney disease with stage 1 through stage 4 chronic kidney disease, or unspecified chronic kidney disease: Secondary | ICD-10-CM | POA: Diagnosis not present

## 2017-12-16 DIAGNOSIS — N183 Chronic kidney disease, stage 3 unspecified: Secondary | ICD-10-CM

## 2017-12-16 DIAGNOSIS — Z79899 Other long term (current) drug therapy: Secondary | ICD-10-CM

## 2017-12-16 DIAGNOSIS — N184 Chronic kidney disease, stage 4 (severe): Secondary | ICD-10-CM | POA: Diagnosis not present

## 2017-12-16 DIAGNOSIS — Z794 Long term (current) use of insulin: Secondary | ICD-10-CM | POA: Diagnosis not present

## 2017-12-16 DIAGNOSIS — E039 Hypothyroidism, unspecified: Secondary | ICD-10-CM

## 2017-12-16 DIAGNOSIS — Z23 Encounter for immunization: Secondary | ICD-10-CM

## 2017-12-16 MED ORDER — ZOSTER VAC RECOMB ADJUVANTED 50 MCG/0.5ML IM SUSR: INTRAMUSCULAR | 1 refills | Status: DC

## 2017-12-16 NOTE — Progress Notes (Addendum)
Subjective:     [name] , female    DOB: 1945/09/04 , 72 y.o.   MRN: [id number]   Diabetes  She presents for her follow-up diabetic visit. She has type 2 diabetes mellitus. Her disease course has been stable. There are no hypoglycemic associated symptoms. Pertinent negatives for diabetes include no chest pain, no fatigue, no polydipsia and no polyphagia. There are no hypoglycemic complications. Symptoms are stable. Diabetic complications include nephropathy. Risk factors for coronary artery disease include diabetes mellitus, dyslipidemia, hypertension and post-menopausal. Current diabetic treatment includes insulin injections. She is compliant with treatment most of the time. Her breakfast blood glucose is taken between 8-9 am. Her breakfast blood glucose range is generally 110-130 mg/dl.  Hypertension  This is a chronic problem. The current episode started more than 1 year ago. The problem is controlled. Pertinent negatives include no chest pain. Risk factors for coronary artery disease include diabetes mellitus, dyslipidemia and post-menopausal state.     Past Medical History:  Diagnosis Date  . Arthritis   . Chronic kidney disease    self reports ckd stage 3   . Diabetes mellitus, type II, insulin dependent ([place])    With neurologic complications. Bilateral lower extremity peripheral neuropathy  . Dyspnea    with excertion; no issues now since weight loss surgery   . Endometriosis   . Essential hypertension   . GERD (gastroesophageal reflux disease)   . 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 Hypothyroidism   . Morbid obesity with BMI of 50.0-59.9, adult ([place])   . Scoliosis       Current Outpatient Medications:  .  aspirin 81 MG chewable tablet, Chew 1 tablet (81 mg total) by mouth 2 (two) times daily., Disp: 60 tablet, Rfl: 1 .  B Complex-C (B-COMPLEX WITH VITAMIN C) tablet, Take 1 tablet by mouth daily., Disp: , Rfl:  .   Cholecalciferol 5000 units TABS, Take 5,000 Units by mouth daily., Disp: , Rfl:  .  colchicine 0.6 MG tablet, Take 1 tablet (0.6 mg total) by mouth daily. (Patient taking differently: Take 0.6 mg by mouth daily as needed (FOR FLAREUPS). ), Disp: 10 tablet, Rfl: 0 .  Cyanocobalamin (VITAMIN B-12) 2500 MCG SUBL, Take 2,500 mcg by mouth daily., Disp: , Rfl:  .  diclofenac sodium (VOLTAREN) 1 % GEL, Apply 2 g topically at bedtime. Applied to feet at bedtime for neuropathy pain, Disp: , Rfl:  .  docusate sodium (COLACE) 100 MG capsule, Take 1 capsule (100 mg total) by mouth 2 (two) times daily. (Patient taking differently: Take 100 mg by mouth 2 (two) times daily as needed for mild constipation. ), Disp: 60 capsule, Rfl: 1 .  gabapentin (NEURONTIN) 300 MG capsule, Take 300 mg by mouth at bedtime. , Disp: , Rfl:  .  insulin degludec (TRESIBA FLEXTOUCH) 100 UNIT/ML SOPN FlexTouch Pen, Inject 0.12 mLs (12 Units total) into the skin daily at 10 pm. (Patient taking differently: Inject 9 Units into the skin daily at 10 pm. ), Disp: 1 pen, Rfl: 0 .  levothyroxine (SYNTHROID, LEVOTHROID) 25 MCG tablet, Take 12.5-25 mcg by mouth See admin instructions. Take 0.5 tablet (12.5 mcg) by mouth on Mondays through Fridays, then take 1 tablet (25 mcg) by mouth on Saturdays and Sundays., Disp: , Rfl:  .  losartan (COZAAR) 25 MG tablet, Take 25 mg by mouth daily., Disp: , Rfl: 1 .  Multiple Vitamins-Minerals (HAIR/SKIN/NAILS/BIOTIN  PO), Take 1 tablet by mouth daily., Disp: , Rfl:  .  Multiple Vitamins-Minerals (MULTIVITAMIN WITH MINERALS) tablet, Take 1 tablet by mouth daily. Women's 50+, Disp: , Rfl:  .  naproxen sodium (ANAPROX) 220 MG tablet, Take 440 mg by mouth 2 (two) times daily as needed (pain). , Disp: , Rfl:  .  ondansetron (ZOFRAN) 4 MG tablet, Take 1 tablet (4 mg total) by mouth every 6 (six) hours as needed for nausea., Disp: 20 tablet, Rfl: 0 .  OZEMPIC 0.25 or 0.5 MG/DOSE SOPN, Inject 0.5 mg into the skin every  Saturday at 6 PM., Disp: , Rfl: 0 .  senna (SENOKOT) 8.6 MG TABS tablet, Take 1 tablet (8.6 mg total) by mouth 2 (two) times daily. (Patient taking differently: Take 1 tablet by mouth 2 (two) times daily as needed for mild constipation. ), Disp: 120 each, Rfl: 0 .  simvastatin (ZOCOR) 10 MG tablet, Take 1 tablet (10 mg total) by mouth daily., Disp: 30 tablet, Rfl: 1 .  carvedilol (COREG CR) 20 MG 24 hr capsule, TAKE 1 CAPSULE DAILY, Disp: 90 capsule, Rfl: 2 .  Zoster Vaccine Adjuvanted [facility]) injection, Inject 0.36m IM x 1, repeat in 2-6 months, Disp: 0.5 mL, Rfl: 1   Allergies  Allergen Reactions  . Meloxicam Other (See Comments)    Fever; muscle aches; "flu-like" symptoms  . Other     Rose fever and hay fever      Review of Systems  Constitutional: Negative.  Negative for fatigue.  HENT: Negative.   Eyes: Negative.   Respiratory: Negative.   Cardiovascular: Negative.  Negative for chest pain.  Gastrointestinal: Negative.   Endocrine: Negative for polydipsia and polyphagia.  Neurological: Negative.   Psychiatric/Behavioral: Negative.      Today's Vitals   12/16/17 1048  BP: 126/88  Pulse: 71  Temp: 97.9 F (36.6 C)  TempSrc: Oral  Weight: 224 lb 6.4 oz (101.8 kg)  Height: _0  (1.651 m)  PainSc: 0-No pain   Body mass index is 37.34 kg/m.   Objective:  Physical Exam  Constitutional: She is oriented to person, place, and time. She appears well-developed and well-nourished.  HENT:  Head: Normocephalic and atraumatic.  Eyes: EOM are normal.  Neck: Normal range of motion.  Cardiovascular: Normal rate, regular rhythm and normal heart sounds.  Pulmonary/Chest: Effort normal and breath sounds normal.  Neurological: She is alert and oriented to person, place, and time.  Psychiatric: She has a normal mood and affect.  Nursing note and vitals reviewed.       Assessment And Plan:     1. Type 2 diabetes mellitus with stage 3 chronic kidney disease, with long-term  current use of insulin (H[facility])  I WILL CHECK LABS AS LISTED BELOW. I PLAN TO INCREASE HER OZEMPIC TO 1MG ONCE WEEKLY. SHE HAS JUST RECEIVED MEDS FROM [facility]OUGH THERE PATIENT ASSISTANCE PROGRAM. PT ADVISED THAT IF MEDICATION IS INCREASED, SHE CAN INJECT 0.5MG TWICE ON THE SAME DAY. I WILL MAKE FURTHER RECOMMENDATIONS ONCE HER LABS ARE AVAILABLE FOR REVIEW.  - CMP14+EGFR - Hemoglobin A1c  2. Chronic renal disease, stage III (HCC)  CHRONIC, YET STABLE. SHE IS ENCOURAGED TO STAY WELL HYDRATED.   3. Hypertensive nephropathy  WELL CONTROLLED. SHE WILL CONTINUE WITH CURRENT MEDS. SHE IS ENCOURAGED TO AVOID ADDING SALT TO HER FOODS.   4. Primary hypothyroidism  I WILL CHECK A THYROID PANEL AND ADJUST MEDS AS NEEDED.   5. Drug therapy  6. Needs flu shot  SHE WAS GIVEN HIGH DOSE FLU VACCINE.         Maximino Greenland, MD

## 2017-12-17 LAB — CMP14+EGFR
ALBUMIN: 4.4 g/dL (ref 3.5–4.8)
ALK PHOS: 79 IU/L (ref 39–117)
ALT: 15 IU/L (ref 0–32)
AST: 25 IU/L (ref 0–40)
Albumin/Globulin Ratio: 1.5 (ref 1.2–2.2)
BUN / CREAT RATIO: 20 (ref 12–28)
BUN: 19 mg/dL (ref 8–27)
Bilirubin Total: 0.4 mg/dL (ref 0.0–1.2)
CALCIUM: 9.7 mg/dL (ref 8.7–10.3)
CO2: 23 mmol/L (ref 20–29)
Chloride: 104 mmol/L (ref 96–106)
Creatinine, Ser: 0.97 mg/dL (ref 0.57–1.00)
GFR calc Af Amer: 67 mL/min/{1.73_m2} (ref >59)
GFR, EST NON AFRICAN AMERICAN: 58 mL/min/{1.73_m2} — AB (ref >59)
GLOBULIN, TOTAL: 2.9 g/dL (ref 1.5–4.5)
GLUCOSE: 62 mg/dL — AB (ref 65–99)
Potassium: 3.7 mmol/L (ref 3.5–5.2)
Sodium: 144 mmol/L (ref 134–144)
Total Protein: 7.3 g/dL (ref 6.0–8.5)

## 2017-12-17 LAB — HEMOGLOBIN A1C
ESTIMATED AVERAGE GLUCOSE: 143 mg/dL
HEMOGLOBIN A1C: 6.6 % — AB (ref 4.8–5.6)

## 2017-12-20 ENCOUNTER — Encounter: Payer: Self-pay | Admitting: Internal Medicine

## 2017-12-21 ENCOUNTER — Telehealth: Payer: Self-pay

## 2017-12-21 DIAGNOSIS — Z23 Encounter for immunization: Secondary | ICD-10-CM | POA: Diagnosis not present

## 2017-12-21 NOTE — Telephone Encounter (Signed)
The pt was read the email about her lab results.  The pt said that she will do the 1mg  of Ozempic.   Your kidney function is stable. Your liver function is normal. Yoru hba1c is 6.6, this is pretty good. I would like to increase your Ozempic to 1mg . This means you will need to inject TWO 0.5mg  injections once weekly. Are you willing to do this? I will decrease your insulin based on Your blood sugars. I am pretty sure we can wean you off completely.   Let me know how you wish to proceed.   Sincerely,    Robyn N. Baird Cancer, MD

## 2017-12-21 NOTE — Progress Notes (Signed)
Here are your lab results:  Your kidney function is stable. Your liver function is normal. Yoru hba1c is 6.6, this is pretty good. I would like to increase your Ozempic to 1mg . This means you will need to inject TWO 0.5mg  injections once weekly. Are you willing to do this? I will decrease your insulin based on  Your blood sugars. I am pretty sure we can wean you off completely.   Let me know how you wish to proceed.   Sincerely,    Zelma Snead N. Baird Cancer, MD

## 2017-12-23 ENCOUNTER — Telehealth: Payer: Self-pay

## 2017-12-23 NOTE — Telephone Encounter (Signed)
The pt was notified that Dr. Baird Cancer said she will continue the [place] and that the pt will eventually be weaned off of the insulin and to decrease her tresiba by 1 unit.

## 2017-12-24 ENCOUNTER — Other Ambulatory Visit: Payer: Self-pay | Admitting: Internal Medicine

## 2017-12-24 ENCOUNTER — Telehealth: Payer: Self-pay

## 2017-12-24 ENCOUNTER — Encounter: Payer: Self-pay | Admitting: Internal Medicine

## 2017-12-24 NOTE — Telephone Encounter (Signed)
Pt notified that her medications from the pt assistance is ready for pickup.  Her Tresiba and Ozempic.

## 2017-12-29 ENCOUNTER — Ambulatory Visit: Payer: Medicare HMO | Admitting: Psychiatry

## 2017-12-30 ENCOUNTER — Ambulatory Visit (INDEPENDENT_AMBULATORY_CARE_PROVIDER_SITE_OTHER): Payer: Medicare HMO | Admitting: Psychiatry

## 2017-12-30 DIAGNOSIS — F4323 Adjustment disorder with mixed anxiety and depressed mood: Secondary | ICD-10-CM

## 2017-12-30 DIAGNOSIS — R69 Illness, unspecified: Secondary | ICD-10-CM | POA: Diagnosis not present

## 2017-12-30 DIAGNOSIS — F509 Eating disorder, unspecified: Secondary | ICD-10-CM | POA: Diagnosis not present

## 2017-12-31 DIAGNOSIS — E119 Type 2 diabetes mellitus without complications: Secondary | ICD-10-CM | POA: Diagnosis not present

## 2018-01-04 ENCOUNTER — Encounter: Payer: Self-pay | Admitting: Internal Medicine

## 2018-01-08 DIAGNOSIS — Z09 Encounter for follow-up examination after completed treatment for conditions other than malignant neoplasm: Secondary | ICD-10-CM | POA: Diagnosis not present

## 2018-01-11 ENCOUNTER — Encounter: Payer: Self-pay | Admitting: Internal Medicine

## 2018-01-12 ENCOUNTER — Other Ambulatory Visit: Payer: Self-pay | Admitting: Internal Medicine

## 2018-01-12 ENCOUNTER — Ambulatory Visit (INDEPENDENT_AMBULATORY_CARE_PROVIDER_SITE_OTHER): Payer: Medicare HMO | Admitting: Psychiatry

## 2018-01-12 DIAGNOSIS — E1351 Other specified diabetes mellitus with diabetic peripheral angiopathy without gangrene: Secondary | ICD-10-CM | POA: Diagnosis not present

## 2018-01-12 DIAGNOSIS — B351 Tinea unguium: Secondary | ICD-10-CM | POA: Diagnosis not present

## 2018-01-12 DIAGNOSIS — M79671 Pain in right foot: Secondary | ICD-10-CM | POA: Diagnosis not present

## 2018-01-12 DIAGNOSIS — F4323 Adjustment disorder with mixed anxiety and depressed mood: Secondary | ICD-10-CM

## 2018-01-12 DIAGNOSIS — G609 Hereditary and idiopathic neuropathy, unspecified: Secondary | ICD-10-CM | POA: Diagnosis not present

## 2018-01-12 DIAGNOSIS — F509 Eating disorder, unspecified: Secondary | ICD-10-CM

## 2018-01-12 DIAGNOSIS — M79672 Pain in left foot: Secondary | ICD-10-CM | POA: Diagnosis not present

## 2018-01-12 DIAGNOSIS — R69 Illness, unspecified: Secondary | ICD-10-CM | POA: Diagnosis not present

## 2018-01-12 DIAGNOSIS — L84 Corns and callosities: Secondary | ICD-10-CM | POA: Diagnosis not present

## 2018-01-18 ENCOUNTER — Encounter: Payer: Self-pay | Admitting: Internal Medicine

## 2018-01-26 ENCOUNTER — Ambulatory Visit (INDEPENDENT_AMBULATORY_CARE_PROVIDER_SITE_OTHER): Payer: Medicare HMO | Admitting: Psychiatry

## 2018-01-26 DIAGNOSIS — F509 Eating disorder, unspecified: Secondary | ICD-10-CM | POA: Diagnosis not present

## 2018-01-26 DIAGNOSIS — F4323 Adjustment disorder with mixed anxiety and depressed mood: Secondary | ICD-10-CM | POA: Diagnosis not present

## 2018-01-26 DIAGNOSIS — R69 Illness, unspecified: Secondary | ICD-10-CM | POA: Diagnosis not present

## 2018-02-04 ENCOUNTER — Ambulatory Visit (INDEPENDENT_AMBULATORY_CARE_PROVIDER_SITE_OTHER): Payer: Medicare HMO | Admitting: Internal Medicine

## 2018-02-04 ENCOUNTER — Ambulatory Visit (INDEPENDENT_AMBULATORY_CARE_PROVIDER_SITE_OTHER): Payer: Medicare HMO

## 2018-02-04 ENCOUNTER — Ambulatory Visit: Payer: Self-pay | Admitting: Internal Medicine

## 2018-02-04 ENCOUNTER — Ambulatory Visit: Payer: Self-pay

## 2018-02-04 VITALS — BP 158/90 | HR 74 | Temp 98.5°F | Ht 62.6 in | Wt 219.8 lb

## 2018-02-04 DIAGNOSIS — E1122 Type 2 diabetes mellitus with diabetic chronic kidney disease: Secondary | ICD-10-CM

## 2018-02-04 DIAGNOSIS — I129 Hypertensive chronic kidney disease with stage 1 through stage 4 chronic kidney disease, or unspecified chronic kidney disease: Secondary | ICD-10-CM

## 2018-02-04 DIAGNOSIS — Z0000 Encounter for general adult medical examination without abnormal findings: Secondary | ICD-10-CM

## 2018-02-04 DIAGNOSIS — N183 Chronic kidney disease, stage 3 unspecified: Secondary | ICD-10-CM

## 2018-02-04 DIAGNOSIS — Z8619 Personal history of other infectious and parasitic diseases: Secondary | ICD-10-CM

## 2018-02-04 DIAGNOSIS — E2839 Other primary ovarian failure: Secondary | ICD-10-CM

## 2018-02-04 DIAGNOSIS — E66812 Obesity, class 2: Secondary | ICD-10-CM

## 2018-02-04 DIAGNOSIS — Z6839 Body mass index (BMI) 39.0-39.9, adult: Secondary | ICD-10-CM

## 2018-02-04 LAB — POCT URINALYSIS DIPSTICK
Bilirubin, UA: NEGATIVE
Blood, UA: NEGATIVE
Glucose, UA: NEGATIVE
Ketones, UA: NEGATIVE
Nitrite, UA: NEGATIVE
Protein, UA: NEGATIVE
Spec Grav, UA: 1.015 (ref 1.010–1.025)
Urobilinogen, UA: NEGATIVE E.U./dL — AB
pH, UA: 7 (ref 5.0–8.0)

## 2018-02-04 LAB — POCT UA - MICROALBUMIN
Albumin/Creatinine Ratio, Urine, POC: 30
Creatinine, POC: 200 mg/dL
Microalbumin Ur, POC: 30 mg/L

## 2018-02-04 NOTE — Progress Notes (Signed)
Subjective:   Teresa Golden is a 72 y.o. female who presents for Medicare Annual (Subsequent) preventive examination.  Review of Systems:  n/a Cardiac Risk Factors include: advanced age (>55men, >61 women);diabetes mellitus;dyslipidemia;hypertension;obesity (BMI >30kg/m2)     Objective:     Vitals: BP (!) 158/90 (BP Location: Left Arm, Patient Position: Sitting)   Pulse 74   Temp 98.5 F (36.9 C) (Oral)   Ht 5' 2.6" (1.59 m)   Wt 219 lb 12.8 oz (99.7 kg)   SpO2 99%   BMI 39.43 kg/m   Body mass index is 39.43 kg/m.  Advanced Directives 02/04/2018 07/30/2017 07/16/2017 08/27/2016 08/26/2016 08/21/2016 06/09/2015  Does Patient Have a Medical Advance Directive? Yes Yes Yes - Yes Yes Yes  Type of Advance Directive Healthcare Power of Attorney Living will Living will [place];Living will [place];Living will Healthcare Power of [place]  Does patient want to make changes to medical advance directive? No - Patient declined No - Patient declined No - Patient declined No - Patient declined - No - Patient declined -  Copy of [place] in Chart? No - copy requested - - No - copy requested No - copy requested No - copy requested -  Would patient like information on creating a medical advance directive? - - - - - - -    Tobacco Social History   Tobacco Use  Smoking Status Former Smoker  . Types: Cigarettes  Smokeless Tobacco Never Used  Tobacco Comment   quit 20 years ago     Counseling given: Not Answered Comment: quit 20 years ago   Clinical Intake:  Pre-visit preparation completed: Yes  Pain : No/denies pain Pain Score: 0-No pain     Nutritional Status: BMI > 30  Obese Nutritional Risks: None Diabetes: Yes CBG done?: No Did pt. bring in CBG monitor from home?: No  How often do you need to have someone help you when you read instructions, pamphlets, or other written materials from your  doctor or pharmacy?: 1 - Never What is the last grade level you completed in school?: college  Interpreter Needed?: No  Information entered by :: NAllen LPN  Past Medical History:  Diagnosis Date  . Arthritis   . Chronic kidney disease    self reports ckd stage 3   . Diabetes mellitus, type II, insulin dependent ([place])    With neurologic complications. Bilateral lower extremity peripheral neuropathy  . Dyspnea    with excertion; no issues now since weight loss surgery   . Endometriosis   . Essential hypertension   . GERD (gastroesophageal reflux disease)   . 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 Hypothyroidism   . Morbid obesity with BMI of 50.0-59.9, adult ([place])   . Scoliosis    Past Surgical History:  Procedure Laterality Date  . ABDOMINAL HYSTERECTOMY     uterus  . CHOLECYSTECTOMY    . EYE SURGERY     cataract extraction bilateral   . JOINT REPLACEMENT     Left knee  . KNEE ARTHROPLASTY Right 07/30/2017   Procedure: RIGHT TOTAL KNEE ARTHROPLASTY WITH COMPUTER NAVIGATION;  Surgeon: Rod Can, MD;  Location: [facility];  Service: Orthopedics;  Laterality: Right;  Needs RNFA  . LAPAROSCOPIC GASTRIC BANDING     and reversal  . LAPAROSCOPIC GASTRIC SLEEVE RESECTION N/A 08/26/2016   Procedure: LAPAROSCOPIC GASTRIC SLEEVE RESECTION  WITH UPPER ENOD;  Surgeon: Johnathan Hausen, MD;  Location: [facility];  Service: General;  Laterality: N/A;  . TONSILLECTOMY    . TRANSTHORACIC ECHOCARDIOGRAM  11/2013   EF 65-70%. Normal diastolic Fxn.  Normal Valves.   Family History  Problem Relation Age of Onset  . Heart attack Mother   . Heart failure Mother   . Stroke Father    Social History   Socioeconomic History  . Marital status: Single    Spouse name: Not on file  . Number of children: Not on file  . Years of education: Not on file  . Highest education level: Not on file  Occupational History  . Occupation: retired  Scientific laboratory technician  .  Financial resource strain: Not hard at all  . Food insecurity:    Worry: Never true    Inability: Never true  . Transportation needs:    Medical: No    Non-medical: No  Tobacco Use  . Smoking status: Former Smoker    Types: Cigarettes  . Smokeless tobacco: Never Used  . Tobacco comment: quit 20 years ago  Substance and Sexual Activity  . Alcohol use: Yes    Alcohol/week: 1.0 standard drinks    Types: 1 Glasses of wine per week    Comment: occasional  . Drug use: No  . Sexual activity: Not Currently  Lifestyle  . Physical activity:    Days per week: 5 days    Minutes per session: 60 min  . Stress: Not at all  Relationships  . Social connections:    Talks on phone: Not on file    Gets together: Not on file    Attends religious service: Not on file    Active member of club or organization: Not on file    Attends meetings of clubs or organizations: Not on file    Relationship status: Not on file  Other Topics Concern  . Not on file  Social History Narrative  . Not on file    Outpatient Encounter Medications as of 02/04/2018  Medication Sig  . B Complex-C (B-COMPLEX WITH VITAMIN C) tablet Take 1 tablet by mouth daily.  . carvedilol (COREG CR) 20 MG 24 hr capsule TAKE 1 CAPSULE DAILY  . Cholecalciferol 5000 units TABS Take 5,000 Units by mouth daily.  . colchicine 0.6 MG tablet Take 1 tablet (0.6 mg total) by mouth daily. (Patient taking differently: Take 0.6 mg by mouth daily as needed (FOR FLAREUPS). )  . Cyanocobalamin (VITAMIN B-12) 2500 MCG SUBL Take 2,500 mcg by mouth daily.  . diclofenac sodium (VOLTAREN) 1 % GEL Apply 2 g topically at bedtime. Applied to feet at bedtime for neuropathy pain  . docusate sodium (COLACE) 100 MG capsule Take 1 capsule (100 mg total) by mouth 2 (two) times daily. (Patient taking differently: Take 100 mg by mouth 2 (two) times daily as needed for mild constipation. )  . gabapentin (NEURONTIN) 300 MG capsule Take 300 mg by mouth at bedtime.     . insulin degludec (TRESIBA FLEXTOUCH) 100 UNIT/ML SOPN FlexTouch Pen Inject 0.12 mLs (12 Units total) into the skin daily at 10 pm. (Patient taking differently: Inject 9 Units into the skin daily at 10 pm. )  . levothyroxine (SYNTHROID, LEVOTHROID) 25 MCG tablet Take 12.5-25 mcg by mouth See admin instructions. Take 0.5 tablet (12.5 mcg) by mouth on Mondays through Fridays, then take 1 tablet (25 mcg) by mouth on Saturdays and Sundays.  [name] losartan (COZAAR) 25 MG tablet TAKE  1 TABLET BY MOUTH ONCE DAILY  . Multiple Vitamins-Minerals (HAIR/SKIN/NAILS/BIOTIN PO) Take 1 tablet by mouth daily.  . Multiple Vitamins-Minerals (MULTIVITAMIN WITH MINERALS) tablet Take 1 tablet by mouth daily. Women's 50+  . naproxen sodium (ANAPROX) 220 MG tablet Take 440 mg by mouth 2 (two) times daily as needed (pain).   [name] OZEMPIC 0.25 or 0.5 MG/DOSE SOPN Inject 0.5 mg into the skin every Saturday at 6 PM.  . senna (SENOKOT) 8.6 MG TABS tablet Take 1 tablet (8.6 mg total) by mouth 2 (two) times daily. (Patient taking differently: Take 1 tablet by mouth 2 (two) times daily as needed for mild constipation. )  . simvastatin (ZOCOR) 10 MG tablet Take 1 tablet (10 mg total) by mouth daily. (Patient taking differently: Take 10 mg by mouth daily. Taking on Monday Wednesday Friday)  . Zoster Vaccine Adjuvanted [facility]) injection Inject 0.5mg  IM x 1, repeat in 2-6 months  . aspirin 81 MG chewable tablet Chew 1 tablet (81 mg total) by mouth 2 (two) times daily. (Patient not taking: Reported on 02/04/2018)  . ondansetron (ZOFRAN) 4 MG tablet Take 1 tablet (4 mg total) by mouth every 6 (six) hours as needed for nausea. (Patient not taking: Reported on 02/04/2018)   No facility-administered encounter medications on file as of 02/04/2018.     Activities of Daily Living In your present state of health, do you have any difficulty performing the following activities: 02/04/2018 07/30/2017  Hearing? N -  Vision? N -  Difficulty  concentrating or making decisions? N -  Walking or climbing stairs? N -  Dressing or bathing? N -  Doing errands, shopping? N N  Preparing Food and eating ? N -  Using the Toilet? N -  In the past six months, have you accidently leaked urine? Y -  Comment once in a while -  Do you have problems with loss of bowel control? N -  Managing your Medications? N -  Managing your Finances? N -  Housekeeping or managing your Housekeeping? N -  Some recent data might be hidden    Patient Care Team: Glendale Chard, MD as PCP - General (Internal Medicine)    Assessment:   This is a routine wellness examination for Zeniyah.  Exercise Activities and Dietary recommendations Current Exercise Habits: Structured exercise class, Type of exercise: strength training/weights;calisthenics;Other - see comments(water aerobics), Frequency (Times/Week): 5, Intensity: Moderate, Exercise limited by: None identified  Goals    . Weight (lb) < 200 lb (90.7 kg) (pt-stated)     Wants to get to 170 pounds       Fall Risk Fall Risk  02/04/2018 12/16/2017 12/17/2016 06/05/2016 05/01/2016  Falls in the past year? 1 No Yes No No  Number falls in past yr: 0 - 2 or more - -  Comment shoe was too big - - - -  Injury with Fall? 0 - Yes - -  Risk for fall due to : History of fall(s);Medication side effect - Impaired mobility;Impaired balance/gait - -  Follow up Falls prevention discussed - - - -   Is the patient's home free of loose throw rugs in walkways, pet beds, electrical cords, etc?   yes      Grab bars in the bathroom? yes      Handrails on the stairs?   n/a      Adequate lighting?   yes  Timed Get Up and Go performed: n/a  Depression Screen PHQ 2/9 Scores 02/04/2018 12/16/2017 12/17/2016 06/05/2016  PHQ - 2 Score 0 0 1 0  PHQ- 9 Score - - 2 -     Cognitive Function     6CIT Screen 02/04/2018  What Year? 0 points  What month? 0 points  What time? 0 points  Count back from 20 0 points  Months in  reverse 0 points  Repeat phrase 0 points  Total Score 0    Immunization History  Administered Date(s) Administered  . Influenza, High Dose Seasonal PF 12/21/2017    Qualifies for Shingles Vaccine? yes  Screening Tests Health Maintenance  Topic Date Due  . FOOT EXAM  04/21/1955  . OPHTHALMOLOGY EXAM  04/21/1955  . PNA vac Low Risk Adult (1 of 2 - PCV13) 04/21/2009  . HEMOGLOBIN A1C  06/17/2018  . MAMMOGRAM  12/12/2019  . COLONOSCOPY  09/14/2022  . TETANUS/TDAP  07/03/2025  . INFLUENZA VACCINE  Completed  . DEXA SCAN  Completed  . Hepatitis C Screening  Completed    Cancer Screenings: Lung: Low Dose CT Chest recommended if Age 69-80 years, 30 pack-year currently smoking OR have quit w/in 15years. Patient does not qualify. Breast:  Up to date on Mammogram? Yes   Up to date of Bone Density/Dexa? Yes Colorectal: up to date  Additional Screenings: : Hepatitis C Screening: had and was treated per patient     Plan:    Waiting to get Shingrix from pharmacy.   I have personally reviewed and noted the following in the patient's chart:   . Medical and social history . Use of alcohol, tobacco or illicit drugs  . Current medications and supplements . Functional ability and status . Nutritional status . Physical activity . Advanced directives . List of other physicians . Hospitalizations, surgeries, and ER visits in previous 12 months . Vitals . Screenings to include cognitive, depression, and falls . Referrals and appointments  In addition, I have reviewed and discussed with patient certain preventive protocols, quality metrics, and best practice recommendations. A written personalized care plan for preventive services as well as general preventive health recommendations were provided to patient.     Kellie Simmering, LPN  [phone]

## 2018-02-04 NOTE — Patient Instructions (Signed)
[name] , Thank you for taking time to come for your Medicare Wellness Visit. I appreciate your ongoing commitment to your health goals. Please review the following plan we discussed and let me know if I can assist you in the future.   Screening recommendations/referrals: Colonoscopy: 08/2012 Mammogram: 11/2017 Bone Density: 12/2015 Recommended yearly ophthalmology/optometry visit for glaucoma screening and checkup Recommended yearly dental visit for hygiene and checkup  Vaccinations: Influenza vaccine: 11/2017 Pneumococcal vaccine: 10/2013 Tdap vaccine: 06/2015 Shingles vaccine: waiting    Advanced directives: Please bring a copy of your POA (Power of [place]) and/or Living Will to your next appointment.    Conditions/risks identified: Obesity  Next appointment: 04/07/2018 at 10:30   Preventive Care 65 Years and Older, Female Preventive care refers to lifestyle choices and visits with your health care provider that can promote health and wellness. What does preventive care include?  A yearly physical exam. This is also called an annual well check.  Dental exams once or twice a year.  Routine eye exams. Ask your health care provider how often you should have your eyes checked.  Personal lifestyle choices, including:  Daily care of your teeth and gums.  Regular physical activity.  Eating a healthy diet.  Avoiding tobacco and drug use.  Limiting alcohol use.  Practicing safe sex.  Taking low-dose aspirin every day.  Taking vitamin and mineral supplements as recommended by your health care provider. What happens during an annual well check? The services and screenings done by your health care provider during your annual well check will depend on your age, overall health, lifestyle risk factors, and family history of disease. Counseling  Your health care provider may ask you questions about your:  Alcohol use.  Tobacco use.  Drug use.  Emotional  well-being.  Home and relationship well-being.  Sexual activity.  Eating habits.  History of falls.  Memory and ability to understand (cognition).  Work and work Statistician.  Reproductive health. Screening  You may have the following tests or measurements:  Height, weight, and BMI.  Blood pressure.  Lipid and cholesterol levels. These may be checked every 5 years, or more frequently if you are over 47 years old.  Skin check.  Lung cancer screening. You may have this screening every year starting at age 44 if you have a 30-pack-year history of smoking and currently smoke or have quit within the past 15 years.  Fecal occult blood test (FOBT) of the stool. You may have this test every year starting at age 46.  Flexible sigmoidoscopy or colonoscopy. You may have a sigmoidoscopy every 5 years or a colonoscopy every 10 years starting at age 54.  Hepatitis C blood test.  Hepatitis B blood test.  Sexually transmitted disease (STD) testing.  Diabetes screening. This is done by checking your blood sugar (glucose) after you have not eaten for a while (fasting). You may have this done every 1-3 years.  Bone density scan. This is done to screen for osteoporosis. You may have this done starting at age 58.  Mammogram. This may be done every 1-2 years. Talk to your health care provider about how often you should have regular mammograms. Talk with your health care provider about your test results, treatment options, and if necessary, the need for more tests. Vaccines  Your health care provider may recommend certain vaccines, such as:  Influenza vaccine. This is recommended every year.  Tetanus, diphtheria, and acellular pertussis (Tdap, Td) vaccine. You may need a Td booster  every 10 years.  Zoster vaccine. You may need this after age 80.  Pneumococcal 13-valent conjugate (PCV13) vaccine. One dose is recommended after age 88.  Pneumococcal polysaccharide (PPSV23) vaccine. One  dose is recommended after age 62. Talk to your health care provider about which screenings and vaccines you need and how often you need them. This information is not intended to replace advice given to you by your health care provider. Make sure you discuss any questions you have with your health care provider. Document Released: 03/09/2015 Document Revised: 10/31/2015 Document Reviewed: 12/12/2014 Elsevier Interactive Patient Education  2017 [place] Prevention in the Home Falls can cause injuries. They can happen to people of all ages. There are many things you can do to make your home safe and to help prevent falls. What can I do on the outside of my home?  Regularly fix the edges of walkways and driveways and fix any cracks.  Remove anything that might make you trip as you walk through a door, such as a raised step or threshold.  Trim any bushes or trees on the path to your home.  Use bright outdoor lighting.  Clear any walking paths of anything that might make someone trip, such as rocks or tools.  Regularly check to see if handrails are loose or broken. Make sure that both sides of any steps have handrails.  Any raised decks and porches should have guardrails on the edges.  Have any leaves, snow, or ice cleared regularly.  Use sand or salt on walking paths during winter.  Clean up any spills in your garage right away. This includes oil or grease spills. What can I do in the bathroom?  Use night lights.  Install grab bars by the toilet and in the tub and shower. Do not use towel bars as grab bars.  Use non-skid mats or decals in the tub or shower.  If you need to sit down in the shower, use a plastic, non-slip stool.  Keep the floor dry. Clean up any water that spills on the floor as soon as it happens.  Remove soap buildup in the tub or shower regularly.  Attach bath mats securely with double-sided non-slip rug tape.  Do not have throw rugs and other  things on the floor that can make you trip. What can I do in the bedroom?  Use night lights.  Make sure that you have a light by your bed that is easy to reach.  Do not use any sheets or blankets that are too big for your bed. They should not hang down onto the floor.  Have a firm chair that has side arms. You can use this for support while you get dressed.  Do not have throw rugs and other things on the floor that can make you trip. What can I do in the kitchen?  Clean up any spills right away.  Avoid walking on wet floors.  Keep items that you use a lot in easy-to-reach places.  If you need to reach something above you, use a strong step stool that has a grab bar.  Keep electrical cords out of the way.  Do not use floor polish or wax that makes floors slippery. If you must use wax, use non-skid floor wax.  Do not have throw rugs and other things on the floor that can make you trip. What can I do with my stairs?  Do not leave any items on the stairs.  Make  sure that there are handrails on both sides of the stairs and use them. Fix handrails that are broken or loose. Make sure that handrails are as long as the stairways.  Check any carpeting to make sure that it is firmly attached to the stairs. Fix any carpet that is loose or worn.  Avoid having throw rugs at the top or bottom of the stairs. If you do have throw rugs, attach them to the floor with carpet tape.  Make sure that you have a light switch at the top of the stairs and the bottom of the stairs. If you do not have them, ask someone to add them for you. What else can I do to help prevent falls?  Wear shoes that:  Do not have high heels.  Have rubber bottoms.  Are comfortable and fit you well.  Are closed at the toe. Do not wear sandals.  If you use a stepladder:  Make sure that it is fully opened. Do not climb a closed stepladder.  Make sure that both sides of the stepladder are locked into place.  Ask  someone to hold it for you, if possible.  Clearly mark and make sure that you can see:  Any grab bars or handrails.  First and last steps.  Where the edge of each step is.  Use tools that help you move around (mobility aids) if they are needed. These include:  Canes.  Walkers.  Scooters.  Crutches.  Turn on the lights when you go into a dark area. Replace any light bulbs as soon as they burn out.  Set up your furniture so you have a clear path. Avoid moving your furniture around.  If any of your floors are uneven, fix them.  If there are any pets around you, be aware of where they are.  Review your medicines with your doctor. Some medicines can make you feel dizzy. This can increase your chance of falling. Ask your doctor what other things that you can do to help prevent falls. This information is not intended to replace advice given to you by your health care provider. Make sure you discuss any questions you have with your health care provider. Document Released: 12/07/2008 Document Revised: 07/19/2015 Document Reviewed: 03/17/2014 Elsevier Interactive Patient Education  2017 [employer].

## 2018-02-04 NOTE — Assessment & Plan Note (Signed)
[facility] to see what f/u she needs.

## 2018-02-04 NOTE — Progress Notes (Signed)
ck

## 2018-02-06 ENCOUNTER — Encounter: Payer: Self-pay | Admitting: Internal Medicine

## 2018-02-06 DIAGNOSIS — E039 Hypothyroidism, unspecified: Secondary | ICD-10-CM | POA: Insufficient documentation

## 2018-02-06 HISTORY — DX: Hypothyroidism, unspecified: E03.9

## 2018-02-06 NOTE — Progress Notes (Signed)
Subjective:     [name] , female    DOB: 1945-04-15 , 72 y.o.   MRN: [id number]   Chief Complaint  Patient presents with  . Diabetes  . Hypertension    HPI  Diabetes  She presents for her follow-up diabetic visit. She has type 2 diabetes mellitus. Her disease course has been improving. There are no hypoglycemic associated symptoms. Pertinent negatives for diabetes include no blurred vision, no chest pain and no fatigue. There are no hypoglycemic complications. Diabetic complications include nephropathy. Risk factors for coronary artery disease include diabetes mellitus, dyslipidemia, hypertension, obesity and post-menopausal.  Hypertension  This is a chronic problem. The current episode started more than 1 year ago. The problem has been gradually improving since onset. The problem is uncontrolled. Pertinent negatives include no blurred vision or chest pain.   States she was in a rush today, and just took her medication. Reports nl blood pressure at other MD offices.  Past Medical History:  Diagnosis Date  . Arthritis   . Chronic kidney disease    self reports ckd stage 3   . Diabetes mellitus, type II, insulin dependent ([place])    With neurologic complications. Bilateral lower extremity peripheral neuropathy  . Dyspnea    with excertion; no issues now since weight loss surgery   . Endometriosis   . Essential hypertension   . GERD (gastroesophageal reflux disease)   . 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 Hypothyroidism   . Morbid obesity with BMI of 50.0-59.9, adult ([place])   . Scoliosis      Family History  Problem Relation Age of Onset  . Heart attack Mother   . Heart failure Mother   . Stroke Father      Current Outpatient Medications:  .  aspirin 81 MG chewable tablet, Chew 1 tablet (81 mg total) by mouth 2 (two) times daily. (Patient not taking: Reported on 02/04/2018), Disp: 60 tablet, Rfl: 1 .  B Complex-C  (B-COMPLEX WITH VITAMIN C) tablet, Take 1 tablet by mouth daily., Disp: , Rfl:  .  carvedilol (COREG CR) 20 MG 24 hr capsule, TAKE 1 CAPSULE DAILY, Disp: 90 capsule, Rfl: 2 .  Cholecalciferol 5000 units TABS, Take 5,000 Units by mouth daily., Disp: , Rfl:  .  colchicine 0.6 MG tablet, Take 1 tablet (0.6 mg total) by mouth daily. (Patient taking differently: Take 0.6 mg by mouth daily as needed (FOR FLAREUPS). ), Disp: 10 tablet, Rfl: 0 .  Cyanocobalamin (VITAMIN B-12) 2500 MCG SUBL, Take 2,500 mcg by mouth daily., Disp: , Rfl:  .  diclofenac sodium (VOLTAREN) 1 % GEL, Apply 2 g topically at bedtime. Applied to feet at bedtime for neuropathy pain, Disp: , Rfl:  .  docusate sodium (COLACE) 100 MG capsule, Take 1 capsule (100 mg total) by mouth 2 (two) times daily. (Patient taking differently: Take 100 mg by mouth 2 (two) times daily as needed for mild constipation. ), Disp: 60 capsule, Rfl: 1 .  gabapentin (NEURONTIN) 300 MG capsule, Take 300 mg by mouth at bedtime. , Disp: , Rfl:  .  insulin degludec (TRESIBA FLEXTOUCH) 100 UNIT/ML SOPN FlexTouch Pen, Inject 0.12 mLs (12 Units total) into the skin daily at 10 pm. (Patient taking differently: Inject 9 Units into the skin daily at 10 pm. ), Disp: 1 pen, Rfl: 0 .  levothyroxine (SYNTHROID, LEVOTHROID) 25 MCG tablet, Take 12.5-25 mcg by mouth See  admin instructions. Take 0.5 tablet (12.5 mcg) by mouth on Mondays through Fridays, then take 1 tablet (25 mcg) by mouth on Saturdays and Sundays., Disp: , Rfl:  .  losartan (COZAAR) 25 MG tablet, TAKE 1 TABLET BY MOUTH ONCE DAILY, Disp: 90 tablet, Rfl: 1 .  Multiple Vitamins-Minerals (HAIR/SKIN/NAILS/BIOTIN PO), Take 1 tablet by mouth daily., Disp: , Rfl:  .  Multiple Vitamins-Minerals (MULTIVITAMIN WITH MINERALS) tablet, Take 1 tablet by mouth daily. Women's 50+, Disp: , Rfl:  .  naproxen sodium (ANAPROX) 220 MG tablet, Take 440 mg by mouth 2 (two) times daily as needed (pain). , Disp: , Rfl:  .  ondansetron  (ZOFRAN) 4 MG tablet, Take 1 tablet (4 mg total) by mouth every 6 (six) hours as needed for nausea. (Patient not taking: Reported on 02/04/2018), Disp: 20 tablet, Rfl: 0 .  OZEMPIC 0.25 or 0.5 MG/DOSE SOPN, Inject 0.5 mg into the skin every Saturday at 6 PM., Disp: , Rfl: 0 .  senna (SENOKOT) 8.6 MG TABS tablet, Take 1 tablet (8.6 mg total) by mouth 2 (two) times daily. (Patient taking differently: Take 1 tablet by mouth 2 (two) times daily as needed for mild constipation. ), Disp: 120 each, Rfl: 0 .  simvastatin (ZOCOR) 10 MG tablet, Take 1 tablet (10 mg total) by mouth daily. (Patient taking differently: Take 10 mg by mouth daily. Taking on Monday Wednesday Friday), Disp: 30 tablet, Rfl: 1 .  Zoster Vaccine Adjuvanted [facility]) injection, Inject 0.5mg  IM x 1, repeat in 2-6 months, Disp: 0.5 mL, Rfl: 1   Allergies  Allergen Reactions  . Meloxicam Other (See Comments)    Fever; muscle aches; "flu-like" symptoms  . Other     Rose fever and hay fever      Review of Systems  Constitutional: Negative.  Negative for fatigue.  Eyes: Negative for blurred vision.  Respiratory: Negative.   Cardiovascular: Negative.  Negative for chest pain.  Gastrointestinal: Negative.   Neurological: Negative.   Psychiatric/Behavioral: Negative.      Today's Vitals   02/04/18 1151  BP: (!) 158/90  Pulse: 74  Temp: 98.5 F (36.9 C)  TempSrc: Oral  Weight: 219 lb 12.8 oz (99.7 kg)  Height: 5' 2.6" (1.59 m)  PainSc: 0-No pain   Body mass index is 39.43 kg/m.   Objective:  Physical Exam Vitals signs reviewed.  Constitutional:      Appearance: Normal appearance. She is obese.  HENT:     Head: Normocephalic and atraumatic.  Neck:     Musculoskeletal: Normal range of motion.  Cardiovascular:     Rate and Rhythm: Normal rate and regular rhythm.     Heart sounds: Normal heart sounds.  Pulmonary:     Effort: Pulmonary effort is normal.     Breath sounds: Normal breath sounds.  Skin:    General:  Skin is warm and dry.  Neurological:     Mental Status: She is alert.  Psychiatric:        Mood and Affect: Mood normal.         Assessment And Plan:     1. Diabetes mellitus with stage 3 chronic kidney disease ([place])  I will check an a1c at her next visit in 8 weeks. She is encouraged to exercise no less than four days weekly. She will continue with Ozempic and [place]. I do plan to completely wean her off of [name], should her blood sugars remain stable. She is in agreement with her treatment plan.   -  POCT Urinalysis Dipstick (81002) - POCT UA - Microalbumin  2. Chronic renal disease, stage III (HCC)  Chronic. She is encouraged to stay well hydrated. I will recheck at her next visit.   3. Hypertensive nephropathy  Uncontrolled. I will not adjust any of her meds at this time. Pt advised that I will need to adjust her meds/add another medication if her bp is elevated at her next visit. She is encouraged to avoid adding salt to her foods.   4. Estrogen deficiency  I will refer her for dexa scan.   - DG Bone Density; Future  5. Hepatitis C virus infection cured after antiviral drug therapy  She no longer wishes to see [facility] at [facility] care. I will try to contact her to see what f/u labs need to be drawn. Last LFTS were wnl.   6. Class 2 severe obesity due to excess calories with serious comorbidity and body mass index (BMI) of 39.0 to 39.9 in adult [facility])  She is s/p gastric sleeve. She was congratulated on her weight loss thus far. She is so thankful and states the surgery has changed her life. She is grateful for her medical care. Again, she is encouraged to exercise four to five days weekly for 30 minutes.   Maximino Greenland, MD

## 2018-02-09 ENCOUNTER — Ambulatory Visit (INDEPENDENT_AMBULATORY_CARE_PROVIDER_SITE_OTHER): Payer: Medicare HMO | Admitting: Psychiatry

## 2018-02-09 DIAGNOSIS — F4323 Adjustment disorder with mixed anxiety and depressed mood: Secondary | ICD-10-CM

## 2018-02-09 DIAGNOSIS — F509 Eating disorder, unspecified: Secondary | ICD-10-CM | POA: Diagnosis not present

## 2018-02-09 DIAGNOSIS — R69 Illness, unspecified: Secondary | ICD-10-CM | POA: Diagnosis not present

## 2018-02-22 DIAGNOSIS — Z471 Aftercare following joint replacement surgery: Secondary | ICD-10-CM | POA: Diagnosis not present

## 2018-02-22 DIAGNOSIS — Z96651 Presence of right artificial knee joint: Secondary | ICD-10-CM | POA: Diagnosis not present

## 2018-02-25 ENCOUNTER — Ambulatory Visit (INDEPENDENT_AMBULATORY_CARE_PROVIDER_SITE_OTHER): Payer: Medicare HMO | Admitting: Psychiatry

## 2018-02-25 DIAGNOSIS — R69 Illness, unspecified: Secondary | ICD-10-CM | POA: Diagnosis not present

## 2018-02-25 DIAGNOSIS — F4323 Adjustment disorder with mixed anxiety and depressed mood: Secondary | ICD-10-CM

## 2018-02-25 DIAGNOSIS — F509 Eating disorder, unspecified: Secondary | ICD-10-CM | POA: Diagnosis not present

## 2018-03-08 ENCOUNTER — Other Ambulatory Visit: Payer: Self-pay

## 2018-03-08 ENCOUNTER — Telehealth: Payer: Self-pay

## 2018-03-08 MED ORDER — SEMAGLUTIDE (1 MG/DOSE) 2 MG/1.5ML ~~LOC~~ SOPN: 1.0000 mg | PEN_INJECTOR | SUBCUTANEOUS | 2 refills | Status: DC

## 2018-03-08 NOTE — Telephone Encounter (Signed)
Patient notified that her SCAT form is ready for pickup.

## 2018-03-11 ENCOUNTER — Ambulatory Visit (INDEPENDENT_AMBULATORY_CARE_PROVIDER_SITE_OTHER): Payer: Medicare HMO | Admitting: Psychiatry

## 2018-03-11 DIAGNOSIS — F509 Eating disorder, unspecified: Secondary | ICD-10-CM | POA: Diagnosis not present

## 2018-03-11 DIAGNOSIS — R69 Illness, unspecified: Secondary | ICD-10-CM | POA: Diagnosis not present

## 2018-03-11 DIAGNOSIS — F4323 Adjustment disorder with mixed anxiety and depressed mood: Secondary | ICD-10-CM | POA: Diagnosis not present

## 2018-03-25 ENCOUNTER — Ambulatory Visit (INDEPENDENT_AMBULATORY_CARE_PROVIDER_SITE_OTHER): Payer: Medicare HMO | Admitting: Psychiatry

## 2018-03-25 DIAGNOSIS — F509 Eating disorder, unspecified: Secondary | ICD-10-CM

## 2018-03-25 DIAGNOSIS — R69 Illness, unspecified: Secondary | ICD-10-CM | POA: Diagnosis not present

## 2018-03-25 DIAGNOSIS — F4323 Adjustment disorder with mixed anxiety and depressed mood: Secondary | ICD-10-CM | POA: Diagnosis not present

## 2018-03-27 ENCOUNTER — Other Ambulatory Visit: Payer: Self-pay | Admitting: Internal Medicine

## 2018-03-31 DIAGNOSIS — E119 Type 2 diabetes mellitus without complications: Secondary | ICD-10-CM | POA: Diagnosis not present

## 2018-04-01 ENCOUNTER — Ambulatory Visit (INDEPENDENT_AMBULATORY_CARE_PROVIDER_SITE_OTHER): Payer: Medicare HMO | Admitting: Psychiatry

## 2018-04-01 DIAGNOSIS — F4323 Adjustment disorder with mixed anxiety and depressed mood: Secondary | ICD-10-CM

## 2018-04-01 DIAGNOSIS — F509 Eating disorder, unspecified: Secondary | ICD-10-CM | POA: Diagnosis not present

## 2018-04-01 DIAGNOSIS — R69 Illness, unspecified: Secondary | ICD-10-CM | POA: Diagnosis not present

## 2018-04-07 ENCOUNTER — Ambulatory Visit: Payer: Medicare HMO | Admitting: Internal Medicine

## 2018-04-08 ENCOUNTER — Ambulatory Visit: Payer: Medicare HMO | Admitting: Psychiatry

## 2018-04-13 ENCOUNTER — Ambulatory Visit: Payer: Medicare HMO | Admitting: Internal Medicine

## 2018-04-20 ENCOUNTER — Ambulatory Visit (INDEPENDENT_AMBULATORY_CARE_PROVIDER_SITE_OTHER): Payer: Medicare HMO | Admitting: Psychiatry

## 2018-04-20 DIAGNOSIS — F4323 Adjustment disorder with mixed anxiety and depressed mood: Secondary | ICD-10-CM | POA: Diagnosis not present

## 2018-04-20 DIAGNOSIS — F509 Eating disorder, unspecified: Secondary | ICD-10-CM | POA: Diagnosis not present

## 2018-04-20 DIAGNOSIS — R69 Illness, unspecified: Secondary | ICD-10-CM | POA: Diagnosis not present

## 2018-04-26 ENCOUNTER — Ambulatory Visit
Admission: RE | Admit: 2018-04-26 | Discharge: 2018-04-26 | Disposition: A | Discharge status: Home / Self Care | Payer: Medicare HMO | Source: Ambulatory Visit | Attending: Internal Medicine | Admitting: Internal Medicine

## 2018-04-26 ENCOUNTER — Encounter: Payer: Self-pay | Admitting: Internal Medicine

## 2018-04-26 ENCOUNTER — Other Ambulatory Visit: Payer: Self-pay

## 2018-04-26 ENCOUNTER — Ambulatory Visit (INDEPENDENT_AMBULATORY_CARE_PROVIDER_SITE_OTHER): Payer: Medicare HMO | Admitting: Internal Medicine

## 2018-04-26 VITALS — BP 156/88 | HR 73 | Temp 97.6°F | Ht 62.6 in | Wt 217.8 lb

## 2018-04-26 DIAGNOSIS — E6609 Other obesity due to excess calories: Secondary | ICD-10-CM | POA: Diagnosis not present

## 2018-04-26 DIAGNOSIS — Z712 Person consulting for explanation of examination or test findings: Secondary | ICD-10-CM | POA: Diagnosis not present

## 2018-04-26 DIAGNOSIS — Z6839 Body mass index (BMI) 39.0-39.9, adult: Secondary | ICD-10-CM

## 2018-04-26 DIAGNOSIS — Z780 Asymptomatic menopausal state: Secondary | ICD-10-CM

## 2018-04-26 DIAGNOSIS — I129 Hypertensive chronic kidney disease with stage 1 through stage 4 chronic kidney disease, or unspecified chronic kidney disease: Secondary | ICD-10-CM | POA: Diagnosis not present

## 2018-04-26 DIAGNOSIS — M810 Age-related osteoporosis without current pathological fracture: Secondary | ICD-10-CM | POA: Diagnosis not present

## 2018-04-26 DIAGNOSIS — E1122 Type 2 diabetes mellitus with diabetic chronic kidney disease: Secondary | ICD-10-CM | POA: Diagnosis not present

## 2018-04-26 DIAGNOSIS — D649 Anemia, unspecified: Secondary | ICD-10-CM

## 2018-04-26 DIAGNOSIS — N183 Chronic kidney disease, stage 3 unspecified: Secondary | ICD-10-CM

## 2018-04-26 DIAGNOSIS — M85832 Other specified disorders of bone density and structure, left forearm: Secondary | ICD-10-CM | POA: Diagnosis not present

## 2018-04-26 DIAGNOSIS — E2839 Other primary ovarian failure: Secondary | ICD-10-CM

## 2018-04-26 DIAGNOSIS — M8580 Other specified disorders of bone density and structure, unspecified site: Secondary | ICD-10-CM

## 2018-04-26 MED ORDER — INSULIN DEGLUDEC 100 UNIT/ML ~~LOC~~ SOPN: 9.0000 [IU] | PEN_INJECTOR | Freq: Every day | SUBCUTANEOUS | 2 refills | Status: DC

## 2018-04-26 NOTE — Progress Notes (Signed)
Subjective:     [name] , female    DOB: 30-Dec-1945 , 73 y.o.   MRN: [id number]   Chief Complaint  Patient presents with  . Diabetes  . Hypertension    HPI  Diabetes  She presents for her follow-up diabetic visit. She has type 2 diabetes mellitus. There are no hypoglycemic associated symptoms. Pertinent negatives for diabetes include no blurred vision and no chest pain. There are no hypoglycemic complications. Risk factors for coronary artery disease include diabetes mellitus, dyslipidemia, hypertension, obesity, sedentary lifestyle and post-menopausal. She participates in exercise intermittently. An ACE inhibitor/angiotensin II receptor blocker is being taken. Eye exam is current.  Hypertension  This is a chronic problem. The current episode started more than 1 year ago. The problem has been gradually improving since onset. The problem is uncontrolled. Pertinent negatives include no blurred vision or chest pain.   Reports compliance with meds.   Past Medical History:  Diagnosis Date  . Arthritis   . Chronic kidney disease    self reports ckd stage 3   . Diabetes mellitus, type II, insulin dependent ([place])    With neurologic complications. Bilateral lower extremity peripheral neuropathy  . Dyspnea    with excertion; no issues now since weight loss surgery   . Endometriosis   . Essential hypertension   . GERD (gastroesophageal reflux disease)   . 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 Hypothyroidism   . Hypothyroidism 02/06/2018  . Morbid obesity with BMI of 50.0-59.9, adult ([place])   . Scoliosis      Family History  Problem Relation Age of Onset  . Heart attack Mother   . Heart failure Mother   . Stroke Father      Current Outpatient Medications:  .  B Complex-C (B-COMPLEX WITH VITAMIN C) tablet, Take 1 tablet by mouth daily., Disp: , Rfl:  .  carvedilol (COREG CR) 20 MG 24 hr capsule, TAKE 1 CAPSULE DAILY, Disp:  90 capsule, Rfl: 2 .  Cholecalciferol 5000 units TABS, Take 5,000 Units by mouth daily., Disp: , Rfl:  .  colchicine 0.6 MG tablet, Take 1 tablet (0.6 mg total) by mouth daily. (Patient taking differently: Take 0.6 mg by mouth daily as needed (FOR FLAREUPS). ), Disp: 10 tablet, Rfl: 0 .  Cyanocobalamin (VITAMIN B-12) 2500 MCG SUBL, Take 2,500 mcg by mouth daily., Disp: , Rfl:  .  diclofenac sodium (VOLTAREN) 1 % GEL, APPLY 2 GRAMS TO AFFECTED AREA(S) 4 TIMES DAILY AS NEEDED, Disp: 200 g, Rfl: 2 .  docusate sodium (COLACE) 100 MG capsule, Take 1 capsule (100 mg total) by mouth 2 (two) times daily. (Patient taking differently: Take 100 mg by mouth 2 (two) times daily as needed for mild constipation. ), Disp: 60 capsule, Rfl: 1 .  gabapentin (NEURONTIN) 300 MG capsule, Take 300 mg by mouth at bedtime. , Disp: , Rfl:  .  insulin degludec (TRESIBA FLEXTOUCH) 100 UNIT/ML SOPN FlexTouch Pen, Inject 0.09 mLs (9 Units total) into the skin daily at 10 pm., Disp: 5 pen, Rfl: 2 .  levothyroxine (SYNTHROID, LEVOTHROID) 25 MCG tablet, Take 12.5-25 mcg by mouth See admin instructions. Take 0.5 tablet (12.5 mcg) by mouth on Mondays through Fridays, then take 1 tablet (25 mcg) by mouth on Saturdays and Sundays., Disp: , Rfl:  .  losartan (COZAAR) 25 MG tablet, TAKE 1 TABLET BY MOUTH ONCE DAILY, Disp: 90 tablet, Rfl: 1 .  Multiple Vitamins-Minerals (HAIR/SKIN/NAILS/BIOTIN PO), Take 1 tablet by mouth daily., Disp: , Rfl:  .  Multiple Vitamins-Minerals (MULTIVITAMIN WITH MINERALS) tablet, Take 1 tablet by mouth daily. Women's 50+, Disp: , Rfl:  .  naproxen sodium (ANAPROX) 220 MG tablet, Take 440 mg by mouth 2 (two) times daily as needed (pain). , Disp: , Rfl:  .  Semaglutide, 1 MG/DOSE, (OZEMPIC, 1 MG/DOSE,) 2 MG/1.5ML SOPN, Inject 1 mg into the skin once a week., Disp: 1 pen, Rfl: 2 .  senna (SENOKOT) 8.6 MG TABS tablet, Take 1 tablet (8.6 mg total) by mouth 2 (two) times daily. (Patient taking differently: Take 1  tablet by mouth 2 (two) times daily as needed for mild constipation. ), Disp: 120 each, Rfl: 0 .  simvastatin (ZOCOR) 10 MG tablet, Take 1 tablet (10 mg total) by mouth daily. (Patient taking differently: Take 10 mg by mouth daily. Taking on Monday Wednesday Friday), Disp: 30 tablet, Rfl: 1 .  aspirin 81 MG chewable tablet, Chew 1 tablet (81 mg total) by mouth 2 (two) times daily. (Patient not taking: Reported on 02/04/2018), Disp: 60 tablet, Rfl: 1 .  ondansetron (ZOFRAN) 4 MG tablet, Take 1 tablet (4 mg total) by mouth every 6 (six) hours as needed for nausea. (Patient not taking: Reported on 04/26/2018), Disp: 20 tablet, Rfl: 0 .  Zoster Vaccine Adjuvanted [facility]) injection, Inject 0.99m IM x 1, repeat in 2-6 months (Patient not taking: Reported on 04/26/2018), Disp: 0.5 mL, Rfl: 1   Allergies  Allergen Reactions  . Meloxicam Other (See Comments)    Fever; muscle aches; "flu-like" symptoms  . Other     Rose fever and hay fever      Review of Systems  Constitutional: Negative.   Eyes: Negative for blurred vision.  Respiratory: Negative.   Cardiovascular: Negative.  Negative for chest pain.  Gastrointestinal: Negative.   Neurological: Negative.   Psychiatric/Behavioral: Negative.      Today's Vitals   04/26/18 1145  BP: (!) 156/88  Pulse: 73  Temp: 97.6 F (36.4 C)  TempSrc: Oral  Weight: 217 lb 12.8 oz (98.8 kg)  Height: 5' 2.6" (1.59 m)   Body mass index is 39.08 kg/m.   Objective:  Physical Exam Vitals signs and nursing note reviewed.  Constitutional:      Appearance: Normal appearance.  HENT:     Head: Normocephalic and atraumatic.  Cardiovascular:     Rate and Rhythm: Normal rate and regular rhythm.     Heart sounds: Normal heart sounds.  Pulmonary:     Effort: Pulmonary effort is normal.     Breath sounds: Normal breath sounds.  Skin:    General: Skin is warm.  Neurological:     General: No focal deficit present.     Mental Status: She is alert.   Psychiatric:        Mood and Affect: Mood normal.        Behavior: Behavior normal.         Assessment And Plan:     1. Diabetes mellitus with stage 3 chronic kidney disease [place]  I will check labs as listed below. She had resumed [place] to elevated bs, which is due to her liberal eating. She is advised to decrease to 5 units Tresiba nightly and resume her healthy eating plan.   - CMP14+EGFR - Hemoglobin A1c - Lipid panel  2. Hypertensive nephropathy  Uncontrolled. I will increase her losartan to 559m(two 2561mabs) daily. She has recently received  a 90 day supply. She will rto in four weeks for re-evaluation. If needed, I will adjust meds further at that time. Importance of medication, dietary and exercise compliance was discussed with the patient.   3. Osteopenia after menopause  She had bone density performed today. I did go over her results in full detail. She is encouraged to incorporate more weight-bearing activities into her daily routine.   4. Anemia, unspecified type  I will recheck CBC today. I will make further recommendations once her labs are available for review.  - CBC no Diff  5. Class 2 obesity due to excess calories without serious comorbidity with body mass index (BMI) of 39.0 to 39.9 in adult  She was congratulated on her weight loss thus far! She is encouraged to strive for BMI less than 30.  Importance of achieving optimal weight to decrease risk of cardiovascular disease and cancers was discussed with the patient in full detail. She is encouraged to start slowly - start with 10 minutes twice daily at least three to four days per week and to gradually build to 30 minutes five days weekly. She was given tips to incorporate more activity into her daily routine - take stairs when possible, park farther away from her job, grocery stores, etc.     Maximino Greenland, MD

## 2018-04-26 NOTE — Patient Instructions (Signed)

## 2018-04-27 LAB — LIPID PANEL
CHOLESTEROL TOTAL: 156 mg/dL (ref 100–199)
Chol/HDL Ratio: 2.4 ratio (ref 0.0–4.4)
HDL: 66 mg/dL (ref >39)
LDL CALC: 79 mg/dL (ref 0–99)
TRIGLYCERIDES: 53 mg/dL (ref 0–149)
VLDL CHOLESTEROL CAL: 11 mg/dL (ref 5–40)

## 2018-04-27 LAB — CMP14+EGFR
A/G RATIO: 1.5 (ref 1.2–2.2)
ALT: 16 IU/L (ref 0–32)
AST: 23 IU/L (ref 0–40)
Albumin: 4 g/dL (ref 3.7–4.7)
Alkaline Phosphatase: 70 IU/L (ref 39–117)
BUN/Creatinine Ratio: 13 (ref 12–28)
BUN: 11 mg/dL (ref 8–27)
Bilirubin Total: 0.4 mg/dL (ref 0.0–1.2)
CO2: 25 mmol/L (ref 20–29)
CREATININE: 0.88 mg/dL (ref 0.57–1.00)
Calcium: 9.6 mg/dL (ref 8.7–10.3)
Chloride: 107 mmol/L — ABNORMAL HIGH (ref 96–106)
GFR, EST AFRICAN AMERICAN: 75 mL/min/{1.73_m2} (ref >59)
GFR, EST NON AFRICAN AMERICAN: 65 mL/min/{1.73_m2} (ref >59)
GLUCOSE: 92 mg/dL (ref 65–99)
Globulin, Total: 2.6 g/dL (ref 1.5–4.5)
Potassium: 4 mmol/L (ref 3.5–5.2)
Sodium: 144 mmol/L (ref 134–144)
TOTAL PROTEIN: 6.6 g/dL (ref 6.0–8.5)

## 2018-04-27 LAB — CBC
Hematocrit: 35.6 % (ref 34.0–46.6)
Hemoglobin: 11.9 g/dL (ref 11.1–15.9)
MCH: 28.4 pg (ref 26.6–33.0)
MCHC: 33.4 g/dL (ref 31.5–35.7)
MCV: 85 fL (ref 79–97)
PLATELETS: 206 10*3/uL (ref 150–450)
RBC: 4.19 x10E6/uL (ref 3.77–5.28)
RDW: 14.5 % (ref 11.7–15.4)
WBC: 8.3 10*3/uL (ref 3.4–10.8)

## 2018-04-27 LAB — HEMOGLOBIN A1C
Est. average glucose Bld gHb Est-mCnc: 131 mg/dL
Hgb A1c MFr Bld: 6.2 % — ABNORMAL HIGH (ref 4.8–5.6)

## 2018-05-01 ENCOUNTER — Encounter: Payer: Self-pay | Admitting: Internal Medicine

## 2018-05-06 DIAGNOSIS — E119 Type 2 diabetes mellitus without complications: Secondary | ICD-10-CM | POA: Diagnosis not present

## 2018-05-06 DIAGNOSIS — H524 Presbyopia: Secondary | ICD-10-CM | POA: Diagnosis not present

## 2018-05-06 DIAGNOSIS — H2513 Age-related nuclear cataract, bilateral: Secondary | ICD-10-CM | POA: Diagnosis not present

## 2018-05-06 DIAGNOSIS — H40003 Preglaucoma, unspecified, bilateral: Secondary | ICD-10-CM | POA: Diagnosis not present

## 2018-05-06 LAB — HM DIABETES EYE EXAM

## 2018-05-25 ENCOUNTER — Ambulatory Visit: Payer: Medicare HMO | Admitting: Psychiatry

## 2018-06-01 ENCOUNTER — Ambulatory Visit: Payer: Medicare HMO | Admitting: Internal Medicine

## 2018-06-02 ENCOUNTER — Ambulatory Visit: Payer: Medicare HMO | Admitting: Psychiatry

## 2018-06-12 ENCOUNTER — Other Ambulatory Visit: Payer: Self-pay | Admitting: Internal Medicine

## 2018-06-15 ENCOUNTER — Other Ambulatory Visit: Payer: Self-pay

## 2018-06-15 MED ORDER — SEMAGLUTIDE (1 MG/DOSE) 2 MG/1.5ML ~~LOC~~ SOPN: 1.0000 mg | PEN_INJECTOR | SUBCUTANEOUS | 2 refills | Status: DC

## 2018-06-22 ENCOUNTER — Ambulatory Visit: Payer: Medicare HMO | Admitting: Psychiatry

## 2018-06-25 ENCOUNTER — Encounter: Payer: Self-pay | Admitting: Internal Medicine

## 2018-06-28 ENCOUNTER — Other Ambulatory Visit: Payer: Self-pay

## 2018-06-28 MED ORDER — LOSARTAN POTASSIUM 25 MG PO TABS: 25.0000 mg | ORAL_TABLET | Freq: Every day | ORAL | 1 refills | Status: DC

## 2018-06-29 DIAGNOSIS — E119 Type 2 diabetes mellitus without complications: Secondary | ICD-10-CM | POA: Diagnosis not present

## 2018-06-30 ENCOUNTER — Telehealth: Payer: Self-pay

## 2018-06-30 NOTE — Telephone Encounter (Signed)
Called pharmacy to notify them of approval diclofenac. Pt has already received medication by delivery

## 2018-07-05 ENCOUNTER — Ambulatory Visit (INDEPENDENT_AMBULATORY_CARE_PROVIDER_SITE_OTHER): Payer: Medicare HMO | Admitting: Internal Medicine

## 2018-07-05 ENCOUNTER — Other Ambulatory Visit: Payer: Self-pay | Admitting: Internal Medicine

## 2018-07-05 ENCOUNTER — Encounter: Payer: Self-pay | Admitting: Internal Medicine

## 2018-07-05 ENCOUNTER — Other Ambulatory Visit: Payer: Self-pay

## 2018-07-05 VITALS — BP 142/80 | HR 84 | Temp 98.2°F | Ht 62.6 in | Wt 221.0 lb

## 2018-07-05 DIAGNOSIS — K7460 Unspecified cirrhosis of liver: Secondary | ICD-10-CM | POA: Insufficient documentation

## 2018-07-05 DIAGNOSIS — G8929 Other chronic pain: Secondary | ICD-10-CM | POA: Insufficient documentation

## 2018-07-05 DIAGNOSIS — K7469 Other cirrhosis of liver: Secondary | ICD-10-CM

## 2018-07-05 DIAGNOSIS — Z6839 Body mass index (BMI) 39.0-39.9, adult: Secondary | ICD-10-CM | POA: Diagnosis not present

## 2018-07-05 DIAGNOSIS — M25512 Pain in left shoulder: Secondary | ICD-10-CM

## 2018-07-05 DIAGNOSIS — N1831 Chronic kidney disease, stage 3a: Secondary | ICD-10-CM | POA: Insufficient documentation

## 2018-07-05 DIAGNOSIS — E039 Hypothyroidism, unspecified: Secondary | ICD-10-CM

## 2018-07-05 DIAGNOSIS — I129 Hypertensive chronic kidney disease with stage 1 through stage 4 chronic kidney disease, or unspecified chronic kidney disease: Secondary | ICD-10-CM | POA: Diagnosis not present

## 2018-07-05 DIAGNOSIS — N183 Chronic kidney disease, stage 3 unspecified: Secondary | ICD-10-CM | POA: Insufficient documentation

## 2018-07-05 HISTORY — DX: Hypertensive chronic kidney disease with stage 1 through stage 4 chronic kidney disease, or unspecified chronic kidney disease: I12.9

## 2018-07-05 MED ORDER — LOSARTAN POTASSIUM 50 MG PO TABS: 50.0000 mg | ORAL_TABLET | Freq: Every day | ORAL | 1 refills | Status: DC

## 2018-07-05 NOTE — Patient Instructions (Signed)
Diabetes Mellitus and Exercise Exercising regularly is important for your overall health, especially when you have diabetes (diabetes mellitus). Exercising is not only about losing weight. It has many other health benefits, such as increasing muscle strength and bone density and reducing body fat and stress. This leads to improved fitness, flexibility, and endurance, all of which result in better overall health. Exercise has additional benefits for people with diabetes, including:  Reducing appetite.  Helping to lower and control blood glucose.  Lowering blood pressure.  Helping to control amounts of fatty substances (lipids) in the blood, such as cholesterol and triglycerides.  Helping the body to respond better to insulin (improving insulin sensitivity).  Reducing how much insulin the body needs.  Decreasing the risk for heart disease by: ? Lowering cholesterol and triglyceride levels. ? Increasing the levels of good cholesterol. ? Lowering blood glucose levels. What is my activity plan? Your health care provider or certified diabetes educator can help you make a plan for the type and frequency of exercise (activity plan) that works for you. Make sure that you:  Do at least 150 minutes of moderate-intensity or vigorous-intensity exercise each week. This could be brisk walking, biking, or water aerobics. ? Do stretching and strength exercises, such as yoga or weightlifting, at least 2 times a week. ? Spread out your activity over at least 3 days of the week.  Get some form of physical activity every day. ? Do not go more than 2 days in a row without some kind of physical activity. ? Avoid being inactive for more than 30 minutes at a time. Take frequent breaks to walk or stretch.  Choose a type of exercise or activity that you enjoy, and set realistic goals.  Start slowly, and gradually increase the intensity of your exercise over time. What do I need to know about managing my  diabetes?   Check your blood glucose before and after exercising. ? If your blood glucose is 240 mg/dL (13.3 mmol/L) or higher before you exercise, check your urine for ketones. If you have ketones in your urine, do not exercise until your blood glucose returns to normal. ? If your blood glucose is 100 mg/dL (5.6 mmol/L) or lower, eat a snack containing 15-20 grams of carbohydrate. Check your blood glucose 15 minutes after the snack to make sure that your level is above 100 mg/dL (5.6 mmol/L) before you start your exercise.  Know the symptoms of low blood glucose (hypoglycemia) and how to treat it. Your risk for hypoglycemia increases during and after exercise. Common symptoms of hypoglycemia can include: ? Hunger. ? Anxiety. ? Sweating and feeling clammy. ? Confusion. ? Dizziness or feeling light-headed. ? Increased heart rate or palpitations. ? Blurry vision. ? Tingling or numbness around the mouth, lips, or tongue. ? Tremors or shakes. ? Irritability.  Keep a rapid-acting carbohydrate snack available before, during, and after exercise to help prevent or treat hypoglycemia.  Avoid injecting insulin into areas of the body that are going to be exercised. For example, avoid injecting insulin into: ? The arms, when playing tennis. ? The legs, when jogging.  Keep records of your exercise habits. Doing this can help you and your health care provider adjust your diabetes management plan as needed. Write down: ? Food that you eat before and after you exercise. ? Blood glucose levels before and after you exercise. ? The type and amount of exercise you have done. ? When your insulin is expected to peak, if you use   insulin. Avoid exercising at times when your insulin is peaking.  When you start a new exercise or activity, work with your health care provider to make sure the activity is safe for you, and to adjust your insulin, medicines, or food intake as needed.  Drink plenty of water while  you exercise to prevent dehydration or heat stroke. Drink enough fluid to keep your urine clear or pale yellow. Summary  Exercising regularly is important for your overall health, especially when you have diabetes (diabetes mellitus).  Exercising has many health benefits, such as increasing muscle strength and bone density and reducing body fat and stress.  Your health care provider or certified diabetes educator can help you make a plan for the type and frequency of exercise (activity plan) that works for you.  When you start a new exercise or activity, work with your health care provider to make sure the activity is safe for you, and to adjust your insulin, medicines, or food intake as needed. This information is not intended to replace advice given to you by your health care provider. Make sure you discuss any questions you have with your health care provider. Document Released: 05/03/2003 Document Revised: 08/21/2016 Document Reviewed: 07/23/2015 Elsevier Interactive Patient Education  2019 [employer]  

## 2018-07-05 NOTE — Progress Notes (Signed)
Subjective:     [name] , female    DOB: Jul 08, 1945 , 73 y.o.   MRN: [id number]   Chief Complaint  Patient presents with  . Hypertension    HPI  She is here today for a blood pressure check.  At her last visit, her losartan was increased to 41m daily. She has tolerated this change in medication without any issues. She admits that she has not been exercising regularly since the pandemic/stay at home orders have been put in place. She lives in a residential senior living facility. She states no one in her facility - residents or staff have been diagnosed with the virus.     Past Medical History:  Diagnosis Date  . Arthritis   . Chronic kidney disease    self reports ckd stage 3   . Diabetes mellitus, type II, insulin dependent ([facility]    With neurologic complications. Bilateral lower extremity peripheral neuropathy  . Dyspnea    with excertion; no issues now since weight loss surgery   . Endometriosis   . Essential hypertension   . GERD (gastroesophageal reflux disease)   . 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 Hypothyroidism   . Hypothyroidism 02/06/2018  . Morbid obesity with BMI of 50.0-59.9, adult ([facility]   . Scoliosis      Family History  Problem Relation Age of Onset  . Heart attack Mother   . Heart failure Mother   . Stroke Father      Current Outpatient Medications:  .  B Complex-C (B-COMPLEX WITH VITAMIN C) tablet, Take 1 tablet by mouth daily., Disp: , Rfl:  .  carvedilol (COREG CR) 20 MG 24 hr capsule, TAKE 1 CAPSULE DAILY, Disp: 90 capsule, Rfl: 2 .  Cholecalciferol 5000 units TABS, Take 5,000 Units by mouth daily., Disp: , Rfl:  .  colchicine 0.6 MG tablet, Take 1 tablet (0.6 mg total) by mouth daily., Disp: 10 tablet, Rfl: 0 .  Cyanocobalamin (VITAMIN B-12) 2500 MCG SUBL, Take 2,500 mcg by mouth daily., Disp: , Rfl:  .  diclofenac sodium (VOLTAREN) 1 % GEL, APPLY 2 GRAMS TO AFFECTED AREA(S) 4 TIMES  DAILY AS NEEDED, Disp: 200 g, Rfl: 2 .  gabapentin (NEURONTIN) 300 MG capsule, Take 300 mg by mouth at bedtime. , Disp: , Rfl:  .  insulin degludec (TRESIBA FLEXTOUCH) 100 UNIT/ML SOPN FlexTouch Pen, Inject 0.09 mLs (9 Units total) into the skin daily at 10 pm., Disp: 5 pen, Rfl: 2 .  levothyroxine (SYNTHROID, LEVOTHROID) 25 MCG tablet, Take 12.5-25 mcg by mouth See admin instructions. Take 0.5 tablet (12.5 mcg) by mouth on Mondays through Fridays, then take 1 tablet (25 mcg) by mouth on Saturdays and Sundays., Disp: , Rfl:  .  Multiple Vitamins-Minerals (HAIR/SKIN/NAILS/BIOTIN PO), Take 1 tablet by mouth daily., Disp: , Rfl:  .  Multiple Vitamins-Minerals (MULTIVITAMIN WITH MINERALS) tablet, Take 1 tablet by mouth daily. Women's 50+, Disp: , Rfl:  .  naproxen sodium (ANAPROX) 220 MG tablet, Take 440 mg by mouth 2 (two) times daily as needed (pain). , Disp: , Rfl:  .  Semaglutide, 1 MG/DOSE, (OZEMPIC, 1 MG/DOSE,) 2 MG/1.5ML SOPN, Inject 1 mg into the skin once a week., Disp: 1 pen, Rfl: 2 .  simvastatin (ZOCOR) 10 MG tablet, Take 1 tablet (10 mg total) by mouth daily. (Patient taking differently: Take 10 mg by mouth daily. Taking on Monday Wednesday Friday), Disp: 30  tablet, Rfl: 1 .  aspirin 81 MG chewable tablet, Chew 1 tablet (81 mg total) by mouth 2 (two) times daily. (Patient not taking: Reported on 02/04/2018), Disp: 60 tablet, Rfl: 1 .  docusate sodium (COLACE) 100 MG capsule, Take 1 capsule (100 mg total) by mouth 2 (two) times daily. (Patient not taking: Reported on 07/05/2018), Disp: 60 capsule, Rfl: 1 .  losartan (COZAAR) 50 MG tablet, Take 1 tablet (50 mg total) by mouth daily., Disp: 90 tablet, Rfl: 1 .  ondansetron (ZOFRAN) 4 MG tablet, Take 1 tablet (4 mg total) by mouth every 6 (six) hours as needed for nausea. (Patient not taking: Reported on 04/26/2018), Disp: 20 tablet, Rfl: 0 .  OZEMPIC, 1 MG/DOSE, 2 MG/1.5ML SOPN, INJECT 1ML INTO THE SKIN ONCE A WEEK (Patient not taking: Reported on  07/05/2018), Disp: 4 mL, Rfl: 0 .  senna (SENOKOT) 8.6 MG TABS tablet, Take 1 tablet (8.6 mg total) by mouth 2 (two) times daily. (Patient not taking: Reported on 07/05/2018), Disp: 120 each, Rfl: 0 .  Zoster Vaccine Adjuvanted [facility]) injection, Inject 0.57m IM x 1, repeat in 2-6 months (Patient not taking: Reported on 04/26/2018), Disp: 0.5 mL, Rfl: 1   Allergies  Allergen Reactions  . Meloxicam Other (See Comments)    Fever; muscle aches; "flu-like" symptoms  . Other     Rose fever and hay fever      Review of Systems  Constitutional: Negative.   Respiratory: Negative.   Cardiovascular: Negative.   Gastrointestinal: Negative.   Musculoskeletal: Positive for arthralgias (she c/o left shoulder pain. she has pain w/ movement - this is not new. she has been seen by Ortho in the past. denies fall/trauma. ).  Neurological: Negative.   Psychiatric/Behavioral: Negative.      Today's Vitals   07/05/18 1002  BP: (!) 142/80  Pulse: 84  Temp: 98.2 F (36.8 C)  TempSrc: Oral  SpO2: 98%  Weight: 221 lb (100.2 kg)  Height: 5' 2.6" (1.59 m)   Body mass index is 39.65 kg/m.   Objective:  Physical Exam Vitals signs and nursing note reviewed.  Constitutional:      Appearance: Normal appearance.  HENT:     Head: Normocephalic and atraumatic.  Cardiovascular:     Rate and Rhythm: Normal rate and regular rhythm.     Heart sounds: Normal heart sounds.  Pulmonary:     Effort: Pulmonary effort is normal.     Breath sounds: Normal breath sounds.  Musculoskeletal:        General: Tenderness present.     Comments: Tenderness to palpation of Left shoulder. Pain w/ forward/lateral abduction.   Skin:    General: Skin is warm.  Neurological:     General: No focal deficit present.     Mental Status: She is alert.  Psychiatric:        Mood and Affect: Mood normal.        Behavior: Behavior normal.         Assessment And Plan:     1. Hypertensive nephropathy  Fair control. She will  continue with losartan 574mdaily. A rx was sent to the pharmacy. She is encouraged to resume her regular exercise regimen.   - BMP8+EGFR  2. Chronic renal disease, stage III (HCC)  Chronic. I will check GFR today since the dose of her losartan was increased.   3. Chronic left shoulder pain  She is advised to apply topical pain rub to affected area two to three  times daily as needed. She will let me know if her sx persist. She is scheduled to see Ortho in the near future.   4. Other cirrhosis of liver (HCC)  Chronic, she is followed by hepatology.  She is encouraged to keep upcoming appt.   5. Primary hypothyroidism  I will check thyroid panel and adjust meds as needed.  - TSH - T4, Free  6. Class 2 severe obesity due to excess calories with serious comorbidity and body mass index (BMI) of 39.0 to 39.9 in adult [facility])  She is aware of four pound weight gain. She is encouraged to incorporate more exercise into her daily routine. She is encouraged to strive for a weight less than 200 pounds to decrease cardiac risk.   Maximino Greenland, MD    THE PATIENT IS ENCOURAGED TO PRACTICE SOCIAL DISTANCING DUE TO THE COVID-19 PANDEMIC.

## 2018-07-06 ENCOUNTER — Ambulatory Visit: Payer: Medicare HMO | Admitting: Licensed Clinical Social Worker

## 2018-07-06 LAB — BMP8+EGFR
BUN/Creatinine Ratio: 23 (ref 12–28)
BUN: 27 mg/dL (ref 8–27)
CO2: 22 mmol/L (ref 20–29)
Calcium: 9.7 mg/dL (ref 8.7–10.3)
Chloride: 104 mmol/L (ref 96–106)
Creatinine, Ser: 1.16 mg/dL — ABNORMAL HIGH (ref 0.57–1.00)
GFR calc Af Amer: 54 mL/min/{1.73_m2} — ABNORMAL LOW (ref >59)
GFR calc non Af Amer: 46 mL/min/{1.73_m2} — ABNORMAL LOW (ref >59)
Glucose: 145 mg/dL — ABNORMAL HIGH (ref 65–99)
Potassium: 3.9 mmol/L (ref 3.5–5.2)
Sodium: 143 mmol/L (ref 134–144)

## 2018-07-06 LAB — T4, FREE: Free T4: 1.54 ng/dL (ref 0.82–1.77)

## 2018-07-06 LAB — TSH: TSH: 1.23 u[IU]/mL (ref 0.450–4.500)

## 2018-07-08 ENCOUNTER — Telehealth: Payer: Self-pay

## 2018-07-08 NOTE — Telephone Encounter (Signed)
The pt was notified that Dr. Baird Cancer referred her to the chronic care management team and that they may help the pt with getting her medication since she is in the donut hole and it would be a call from [facility], or [facility].  The pt said that she has filled out a form to get her medications.  I told the pt that they can help her when she may need the help again and the pt said ok she will be looking for their call.

## 2018-07-12 ENCOUNTER — Other Ambulatory Visit: Payer: Self-pay | Admitting: Internal Medicine

## 2018-07-12 DIAGNOSIS — K7469 Other cirrhosis of liver: Secondary | ICD-10-CM

## 2018-07-12 DIAGNOSIS — E039 Hypothyroidism, unspecified: Secondary | ICD-10-CM

## 2018-07-12 DIAGNOSIS — E1122 Type 2 diabetes mellitus with diabetic chronic kidney disease: Secondary | ICD-10-CM

## 2018-07-12 DIAGNOSIS — N183 Chronic kidney disease, stage 3 unspecified: Secondary | ICD-10-CM

## 2018-07-12 DIAGNOSIS — I129 Hypertensive chronic kidney disease with stage 1 through stage 4 chronic kidney disease, or unspecified chronic kidney disease: Secondary | ICD-10-CM

## 2018-07-15 ENCOUNTER — Ambulatory Visit: Payer: Self-pay

## 2018-07-15 ENCOUNTER — Ambulatory Visit (INDEPENDENT_AMBULATORY_CARE_PROVIDER_SITE_OTHER): Payer: Medicare HMO

## 2018-07-15 DIAGNOSIS — N183 Chronic kidney disease, stage 3 unspecified: Secondary | ICD-10-CM

## 2018-07-15 DIAGNOSIS — Z6839 Body mass index (BMI) 39.0-39.9, adult: Secondary | ICD-10-CM

## 2018-07-15 DIAGNOSIS — E1122 Type 2 diabetes mellitus with diabetic chronic kidney disease: Secondary | ICD-10-CM

## 2018-07-15 DIAGNOSIS — I129 Hypertensive chronic kidney disease with stage 1 through stage 4 chronic kidney disease, or unspecified chronic kidney disease: Secondary | ICD-10-CM

## 2018-07-15 NOTE — Chronic Care Management (AMB) (Signed)
Chronic Care Management    Clinical Social Work General Note  07/15/2018 Name: Teresa Golden MRN: [id number] DOB: 06/26/1945  Teresa Golden is a 73 y.o. year old female who is a primary care patient of Sanders, Robyn, MD. The CCM was consulted to assist the patient with chronic care management.   Teresa Golden was given information about Chronic Care Management services today including:  1. CCM service includes personalized support from designated clinical staff supervised by her physician, including individualized plan of care and coordination with other care providers 2. 24/7 contact phone numbers for assistance for urgent and routine care needs. 3. Service will only be billed when office clinical staff spend 20 minutes or more in a month to coordinate care. 4. Only one practitioner may furnish and bill the service in a calendar month. 5. The patient may stop CCM services at any time (effective at the end of the month) by phone call to the office staff. 6. The patient will be responsible for cost sharing (co-pay) of up to 20% of the service fee (after annual deductible is met).  Patient agreed to services and verbal consent obtained.   Review of patient status, including review of consultants reports, relevant laboratory and other test results, and collaboration with appropriate care team members and the patient's provider was performed as part of comprehensive patient evaluation and provision of chronic care management services.    SDOH (Social Determinants of Health) screening performed today. No SDOH challenges identified. The patient reports she has a healthcare power of attorney and living will document. Copies requested for chart.    Goals Addressed            This Visit's Progress     Patient Stated   . "I am having trouble affording my Ozempic and Tresiba" (pt-stated)       Current Barriers:  . Financial constraints  . Currently in a coverage gap . Awaiting outcome of patient  assistance application  Clinical Social Work Clinical Goal(s):  . Over the next 20 days, patient will work with CCM team to address needs related to medication costs  CCM SW Interventions: Completed on 07/15/18 . Patient interviewed and appropriate assessments performed . Discussed plans with patient for ongoing care management follow up and provided patient with direct contact information for care management team  . Informed by patient she has applied for patient assistance program but has yet to hear outcome . Determined the patient is due for a medication refill within the next two days . Collaboration with primary care team to determine if samples are available for the patient . Collaboration with embedded PharmD Julie Pruitt regarding patient stated goal . Notified the patient the office has samples of Tresiba available but not Ozempic  Patient Self Care Activities:  . Self administers medications as prescribed . Calls pharmacy for medication refills . Calls provider office for new concerns or questions  Initial goal documentation     . "I am having trouble managing my blood sugar" (pt-stated)       Current Barriers:  . Social Isolation due to COVID 19 . Inability to attend regular exercise program due to COVID 19 . Recent weight gain of 4 pounds  Clinical Social Work Clinical Goal(s):  . Over the next 45 days, patient will work with CCM RN Case Manager to address needs related to disease management  Interventions: . Patient interviewed and appropriate assessments performed . Discussed plans with patient for ongoing care management follow   up and provided patient with direct contact information for care management team  . Gathered history from the patient - the patient reports she underwent bariatric surgery in 2018 and normally participates in water aerobics. Due to COVID 19 the patients exercise regimen has been interrupted. The patient has gained 4 pounds and is noticing her  blood sugar is running higher than normal . Provided the patient with education surrounding the CCM program and roles of each care team member . Collaboration with CCM RN Case Manager who will follow up with the patient regarding patient stated goal  Patient Self Care Activities:  . Self administers medications as prescribed . Attends all scheduled provider appointments . Performs ADL's independently  Initial goal documentation         Follow Up Plan: No SW needs identified during today's call. The CCM team will follow up with the patient over the next 7-14 days.        , BSW, CDP [facility] Care Management Social Worker [phone]  Total time spent performing care coordination and/or care management activities with the patient by phone or face to face = 42 minutes.      

## 2018-07-15 NOTE — Patient Instructions (Signed)
Visit Information  Goals Addressed    . Assist with Chronic Disease Management and Care Coordination        Current Barriers:  [name] Knowledge Barriers related to resources and support available to address needs related to Chronic Disease Management and Care Coordination   Case Manager Clinical Goal(s):  [name] Over the next 30 days, patient will work with the CCM team to address needs related to Chronic disease management and Community Resources  Interventions:  . Collaborated with BSW and initiated plan of care to address needs related to chronic disease management for DMII and medication management  Patient Self Care Activities:  . Attends all scheduled provider appointments . Calls provider office for new concerns or questions  Initial goal documentation       The patient verbalized understanding of instructions provided today and declined a print copy of patient instruction materials.   Telephone follow up appointment with CCM team member scheduled for: 07/27/18  Barb Merino, [facility] Care Management Coordinator [place] [facility]  Direct Phone: [phone]

## 2018-07-15 NOTE — Patient Instructions (Signed)
Social Worker Visit Information  Goals we discussed today:  Goals Addressed            This Visit's Progress     Patient Stated   . "I am having trouble affording my Ozempic and Tresiba" (pt-stated)       Current Barriers:  . Financial constraints  . Currently in a coverage gap . Awaiting outcome of patient assistance application  Clinical Social Work Clinical Goal(s):  [name] Over the next 20 days, patient will work with CCM team to address needs related to medication costs  CCM SW Interventions: Completed on 07/15/18 . Patient interviewed and appropriate assessments performed . Discussed plans with patient for ongoing care management follow up and provided patient with direct contact information for care management team  . Informed by patient she has applied for patient assistance program but has yet to hear outcome . Determined the patient is due for a medication refill within the next two days . Collaboration with primary care team to determine if samples are available for the patient . Collaboration with embedded PharmD Lottie Dawson regarding patient stated goal . Notified patient the office has samples of Tresiba available but not Ozempic  Patient Self Care Activities:  . Self administers medications as prescribed . Calls pharmacy for medication refills . Calls provider office for new concerns or questions  Initial goal documentation     . "I am having trouble managing my blood sugar" (pt-stated)       Current Barriers:  . Social Isolation due to COVID 19 . Inability to attend regular exercise program due to COVID 19 . Recent weight gain of 4 pounds  Clinical Social Work Clinical Goal(s):  [name] Over the next 45 days, patient will work with CCM RN Case Manager to address needs related to disease management  Interventions: . Patient interviewed and appropriate assessments performed . Discussed plans with patient for ongoing care management follow up and provided patient  with direct contact information for care management team  . Gathered history from the patient - the patient reports she underwent bariatric surgery in 2018 and normally participates in water aerobics. Due to COVID 19 the patients exercise regimen has been interrupted. The patient has gained 4 pounds and is noticing her blood sugar is running higher than normal . Provided the patient with education surrounding the CCM program and roles of each care team member . Collaboration with CCM RN Case Manager who will follow up with the patient regarding patient stated goal  Patient Self Care Activities:  . Self administers medications as prescribed . Attends all scheduled provider appointments . Performs ADL's independently  Initial goal documentation         Materials provided: Verbal education about CCM program provided by phone  Ms. Cavey was given information about Chronic Care Management services today including:  1. CCM service includes personalized support from designated clinical staff supervised by her physician, including individualized plan of care and coordination with other care providers 2. 24/7 contact phone numbers for assistance for urgent and routine care needs. 3. Service will only be billed when office clinical staff spend 20 minutes or more in a month to coordinate care. 4. Only one practitioner may furnish and bill the service in a calendar month. 5. The patient may stop CCM services at any time (effective at the end of the month) by phone call to the office staff. 6. The patient will be responsible for cost sharing (co-pay) of up to 20% of  the service fee (after annual deductible is met).  Patient agreed to services and verbal consent obtained.   The patient verbalized understanding of instructions provided today and declined a print copy of patient instruction materials.   Follow up plan: No further SW follow up planned. CCM RN Case Manager to follow.   Daneen Schick,  BSW, CDP [facility] Care Management Social Worker [phone]

## 2018-07-15 NOTE — Chronic Care Management (AMB) (Signed)
  Chronic Care Management   Initial Visit Note  07/15/2018 Name: DAVEENA ELMORE MRN: [id number] DOB: 12-28-1945  Referred by: Glendale Chard, MD Reason for referral : Chronic Care Management (CCM RN Collaboration )   CHARLENA HAUB is a 73 y.o. year old female who is a primary care patient of Glendale Chard, MD. The CCM team was consulted for assistance with chronic disease management and care coordination needs.   Review of patient status, including review of consultants reports, relevant laboratory and other test results, and collaboration with appropriate care team members and the patient's provider was performed as part of comprehensive patient evaluation and provision of chronic care management services.    I initiated and established the plan of care for [name] during one on one collaboration with my clinical care management colleague Daneen Schick BSW who is also engaged with this patient to address social work needs.   Goals Addressed    . Assist with Chronic Disease Management and Care Coordination        Current Barriers:  [name] Knowledge Barriers related to resources and support available to address needs related to Chronic Disease Management and Care Coordination   Case Manager Clinical Goal(s):  [name] Over the next 30 days, patient will work with the CCM team to address needs related to Chronic disease management and Community Resources  Interventions:  . Collaborated with BSW and initiated plan of care to address needs related to chronic disease management for DMII and medication management  Patient Self Care Activities:  . Attends all scheduled provider appointments . Calls provider office for new concerns or questions  Initial goal documentation        Telephone follow up appointment with CCM team member scheduled for: 07/29/18  Barb Merino, [facility] Care Management Coordinator [place] [facility]  Direct Phone: [phone]

## 2018-07-27 ENCOUNTER — Telehealth: Payer: Self-pay

## 2018-07-27 DIAGNOSIS — E1351 Other specified diabetes mellitus with diabetic peripheral angiopathy without gangrene: Secondary | ICD-10-CM | POA: Diagnosis not present

## 2018-07-27 DIAGNOSIS — L602 Onychogryphosis: Secondary | ICD-10-CM | POA: Diagnosis not present

## 2018-08-05 ENCOUNTER — Telehealth: Payer: Self-pay

## 2018-08-05 NOTE — Telephone Encounter (Signed)
Spoke with patient to inform her that [facility] is going to help her with her PAP paperwork. Patient agreed.

## 2018-08-05 NOTE — Telephone Encounter (Signed)
error 

## 2018-08-08 ENCOUNTER — Other Ambulatory Visit: Payer: Self-pay | Admitting: Internal Medicine

## 2018-08-09 ENCOUNTER — Telehealth: Payer: Self-pay

## 2018-08-11 ENCOUNTER — Other Ambulatory Visit: Payer: Self-pay | Admitting: Internal Medicine

## 2018-08-11 ENCOUNTER — Encounter: Payer: Self-pay | Admitting: Pharmacist

## 2018-08-11 ENCOUNTER — Ambulatory Visit (INDEPENDENT_AMBULATORY_CARE_PROVIDER_SITE_OTHER): Payer: Medicare HMO | Admitting: Pharmacist

## 2018-08-11 DIAGNOSIS — N183 Chronic kidney disease, stage 3 unspecified: Secondary | ICD-10-CM

## 2018-08-11 DIAGNOSIS — E1122 Type 2 diabetes mellitus with diabetic chronic kidney disease: Secondary | ICD-10-CM | POA: Diagnosis not present

## 2018-08-11 DIAGNOSIS — I129 Hypertensive chronic kidney disease with stage 1 through stage 4 chronic kidney disease, or unspecified chronic kidney disease: Secondary | ICD-10-CM | POA: Diagnosis not present

## 2018-08-11 NOTE — Patient Instructions (Addendum)
Visit Information  Goals      Patient Stated   . "I am having trouble affording my Ozempic and Tresiba" (pt-stated)     . Current Barriers:  . Financial constraints  . Currently in a coverage gap . Awaiting outcome of patient assistance application  Clinical Social Work Clinical Goal(s):  [name] Over the next 20 days, patient will work with CCM team to address needs related to medication costs  CCM SW Interventions: Completed on 07/15/18 . Patient interviewed and appropriate assessments performed . Discussed plans with patient for ongoing care management follow up and provided patient with direct contact information for care management team  . Informed by patient she has applied for patient assistance program but has yet to hear outcome . Determined the patient is due for a medication refill within the next two days . Collaboration with primary care team to determine if samples are available for the patient . Collaboration with embedded PharmD Lottie Dawson regarding patient stated goal . Notified patient the office has samples of Tresiba available but not Ozempic  CCM PharmD Interventions: Completed on 08/11/18 . Comprehensive medication review performed and list updated in the EMR . Refill request faxed to [employer] to obtain free medication (injecting Tresiba 7 units daily) . Requested call back from [employer] regarding application for [place].  Of note, patient is already approved for Ozempic until 12/2018 . Will continue to follow.  When patient's medication is approved, it will ship to PCP office.   Patient Self Care Activities:  . Self administers medications as prescribed . Calls pharmacy for medication refills . Calls provider office for new concerns or questions  Initial goal documentation     . "I am having trouble managing my blood sugar" (pt-stated)     Current Barriers:  . Social Isolation due to COVID 19 . Inability to attend regular exercise program due to  COVID 19 . Recent weight gain of 4 pounds  Clinical Social Work Clinical Goal(s):  [name] Over the next 45 days, patient will work with CCM RN Case Manager to address needs related to disease management  Interventions: . Patient interviewed and appropriate assessments performed . Discussed plans with patient for ongoing care management follow up and provided patient with direct contact information for care management team  . Gathered history from the patient - the patient reports she underwent bariatric surgery in 2018 and normally participates in water aerobics. Due to COVID 19 the patients exercise regimen has been interrupted. The patient has gained 4 pounds and is noticing her blood sugar is running higher than normal . Provided the patient with education surrounding the CCM program and roles of each care team member . Collaboration with CCM RN Case Manager who will follow up with the patient regarding patient stated goal  Patient Self Care Activities:  . Self administers medications as prescribed . Attends all scheduled provider appointments . Performs ADL's independently  Please see past updates related to this goal by clicking on the "Past Updates" button in the selected goal      . I want to continue to optimize medication management of my chronic conditons (pt-stated)     Current Barriers:  . Non Adherence to prescribed medication regimen . Financial Barriers  Pharmacist Clinical Goal(s):  [name] Over the next 90 days, patient will work with CCM team & PCP to address needs related to optimized medication management of chronic conditions.  Interventions: . Comprehensive medication review performed. . Advised patient to continue  eating an ADA recommended diet.  Reviewed food options for snacks, etc.   . Patient reports FBGs mostly in the 90-100s.  She states her BG never exceed 140.  She denies hypoglycemia. . She is motivated to continue her progress.  She is determined to not gain  weight, however is unable to exercise  as much as she did pre-COVID.  She enjoys swimming at the [facility]. . Diabetes regimen optimized at this time with [place] 7 units daily and weekly Ozempic. [name] Patient is taking statin therapy MWF and reports compliance.  Also, encouraged patient to continue taking aspiring 81mg  as prescribed by MD . Will continue to follow   Patient Self Care Activities:  . Self administers medications as prescribed . Attends all scheduled provider appointments . Calls pharmacy for medication refills  Initial goal documentation     . I would like to (pt-stated)    . Weight (lb) < 200 lb (90.7 kg) (pt-stated)     Wants to get to 170 pounds      Other   . Assist with Chronic Disease Management and Care Coordination      Current Barriers:  [name] Knowledge Barriers related to resources and support available to address needs related to Chronic Disease Management and Care Coordination   Case Manager Clinical Goal(s):  [name] Over the next 30 days, patient will work with the CCM team to address needs related to Chronic disease management and Community Resources  Interventions:  . Collaborated with BSW and initiated plan of care to address needs related to chronic disease management for DMII and medication management  Patient Self Care Activities:  . Attends all scheduled provider appointments . Calls provider office for new concerns or questions  Initial goal documentation        The patient verbalized understanding of instructions provided today and declined a print copy of patient instruction materials.   The care management team will reach out to the patient again over the next 5 days.   Regina Eck, PharmD, BCPS Clinical Pharmacist, [facility] [place]: [phone]

## 2018-08-11 NOTE — Progress Notes (Signed)
Chronic Care Management   Initial Visit Note  08/11/2018 Name: Teresa Golden MRN: [id number] DOB: 1946-01-28  Referred by: Teresa Chard, MD Reason for referral : Chronic Care Management   Teresa Golden is a 73 y.o. year old female who is a primary care patient of Teresa Chard, MD. The CCM team was consulted for assistance with chronic disease management and care coordination needs.   Review of patient status, including review of consultants reports, relevant laboratory and other test results, and collaboration with appropriate care team members and the patient's provider was performed as part of comprehensive patient evaluation and provision of chronic care management services.    I spoke with Teresa Golden by telephone today  Objective:   Goals Addressed            This Visit's Progress     Patient Stated   . "I am having trouble affording my Teresa Golden and Teresa Golden" (pt-stated)       . Current Barriers:  . Financial constraints  . Currently in a coverage gap . Awaiting outcome of patient assistance application  Clinical Social Work Clinical Goal(s):  [name] Over the next 20 days, patient will work with CCM team to address needs related to medication costs  CCM SW Interventions: Completed on 07/15/18 . Patient interviewed and appropriate assessments performed . Discussed plans with patient for ongoing care management follow up and provided patient with direct contact information for care management team  . Informed by patient she has applied for patient assistance program but has yet to hear outcome . Determined the patient is due for a medication refill within the next two days . Collaboration with primary care team to determine if samples are available for the patient . Collaboration with embedded PharmD Teresa Golden regarding patient stated goal . Notified patient the office has samples of Teresa Golden available but not Teresa Golden  CCM PharmD Interventions: Completed on  08/11/18 . Comprehensive medication review performed and list updated in the EMR . Refill request faxed to [employer] to obtain free medication via patient asisstance program (injecting Teresa Golden 7 units daily).  Corrected missing information from application . Requested call back from [employer] regarding application for Teresa Golden.  Of note, patient is already approved for Teresa Golden until 12/2018 . Will continue to follow.  When patient's medication is approved, it will ship to PCP office.  Patient Self Care Activities:  . Self administers medications as prescribed . Calls pharmacy for medication refills . Calls provider office for new concerns or questions  Initial goal documentation   . I want to continue to optimize medication management of my chronic conditons (pt-stated)       Current Barriers:  . Non Adherence to prescribed medication regimen . Financial Barriers  Pharmacist Clinical Goal(s):  [name] Over the next 90 days, patient will work with CCM team & PCP to address needs related to optimized medication management of chronic conditions.  Interventions: . Comprehensive medication review performed. . Advised patient to continue eating an ADA recommended diet.  Reviewed food options for snacks, etc.   . Patient reports FBGs mostly in the 90-100s.  She states her BG has not exceeded 140 in months.  She denies hypoglycemia. . She is motivated to continue her progress.  She is determined to not gain weight, however is unable to exercise as much as she did pre-COVID.  She enjoys swimming at the [facility].  Suggested walking outside as able. . Diabetes regimen optimized at this time with Teresa Golden  7 units daily and weekly Teresa Golden. [name] Patient is taking statin therapy MWF and reports compliance.  Also, encouraged patient to continue taking aspirin 81mg  daily as prescribed by MD. . Will continue to follow   Patient Self Care Activities:  . Self administers medications as prescribed . Attends all  scheduled provider appointments . Calls pharmacy for medication refills  Initial goal documentation        Plan:   The care management team will reach out to the patient again over the next 5 days.   Teresa Golden, PharmD, BCPS Clinical Pharmacist, [facility] [place]: [phone]

## 2018-08-13 ENCOUNTER — Telehealth: Payer: Self-pay

## 2018-08-16 ENCOUNTER — Ambulatory Visit: Payer: Self-pay | Admitting: Pharmacist

## 2018-08-16 DIAGNOSIS — N183 Chronic kidney disease, stage 3 unspecified: Secondary | ICD-10-CM

## 2018-08-16 DIAGNOSIS — I129 Hypertensive chronic kidney disease with stage 1 through stage 4 chronic kidney disease, or unspecified chronic kidney disease: Secondary | ICD-10-CM

## 2018-08-16 DIAGNOSIS — E1122 Type 2 diabetes mellitus with diabetic chronic kidney disease: Secondary | ICD-10-CM

## 2018-08-16 NOTE — Progress Notes (Signed)
  Chronic Care Management   Initial Visit Note  08/16/2018 Name: Teresa Golden MRN: [id number] DOB: 1945-11-07  Referred by: Teresa Chard, MD Reason for referral : Chronic Care Management   Teresa Golden is a 73 y.o. year old female who is a primary care patient of Teresa Chard, MD. The CCM team was consulted for assistance with chronic disease management and care coordination needs.   Review of patient status, including review of consultants reports, relevant laboratory and other test results, and collaboration with appropriate care team members and the patient's provider was performed as part of comprehensive patient evaluation and provision of chronic care management services.    I spoke with Teresa Golden by telephone today.  Objective:   Goals Addressed            This Visit's Progress     Patient Stated   . "I am having trouble affording my Ozempic and Tresiba" (pt-stated)       . Current Barriers:  . Financial constraints  . Currently in a coverage gap . Awaiting outcome of patient assistance application  Clinical Social Work Clinical Goal(s):  Teresa Golden Over the next 20 days, patient will work with CCM team to address needs related to medication costs  CCM SW Interventions: Completed on 07/15/18 . Patient interviewed and appropriate assessments performed . Discussed plans with patient for ongoing care management follow up and provided patient with direct contact information for care management team  . Informed by patient she has applied for patient assistance program but has yet to hear outcome . Determined the patient is due for a medication refill within the next two days . Collaboration with primary care team to determine if samples are available for the patient . Collaboration with embedded PharmD Lottie Dawson regarding patient stated goal . Notified patient the office has samples of Tresiba available but not Ozempic  CCM PharmD Interventions: . Comprehensive medication  review performed and list updated in the EMR . Refill request faxed to [employer] to obtain free medication (injecting Tresiba 7 units daily) . Requested call back from [employer] regarding application for [place].  Of note, patient is already approved for Ozempic until 12/2018.  Spoke with [employer] rep that requested we put daily dose in 'max dose box" and resend form for dose changes/titrations.  [employer] representative suggested I call back within 24-48 hours to follow up on new request.  Faxed updated form to [place] Patient assistance [phone]. Confirmation successful. . Informed patient I will call her when new information is discovered. . Will continue to follow.  When patient's medication is approved, it will ship to PCP office.   Patient Self Care Activities:  . Self administers medications as prescribed . Calls pharmacy for medication refills . Calls provider office for new concerns or questions  Please see past updates related to this goal by clicking on the "Past Updates" button in the selected goal          Plan:   The care management team will reach out to the patient again over the next 3 business days days.   Regina Eck, PharmD, BCPS Clinical Pharmacist, [facility] [place]: [phone]

## 2018-08-16 NOTE — Patient Instructions (Signed)
Visit Information  Goals Addressed            This Visit's Progress     Patient Stated   . "I am having trouble affording my Ozempic and Tresiba" (pt-stated)       . Current Barriers:  . Financial constraints  . Currently in a coverage gap . Awaiting outcome of patient assistance application  Clinical Social Work Clinical Goal(s):  [name] Over the next 20 days, patient will work with CCM team to address needs related to medication costs  CCM SW Interventions: Completed on 07/15/18 . Patient interviewed and appropriate assessments performed . Discussed plans with patient for ongoing care management follow up and provided patient with direct contact information for care management team  . Informed by patient she has applied for patient assistance program but has yet to hear outcome . Determined the patient is due for a medication refill within the next two days . Collaboration with primary care team to determine if samples are available for the patient . Collaboration with embedded PharmD Lottie Dawson regarding patient stated goal . Notified patient the office has samples of Tresiba available but not Ozempic  CCM PharmD Interventions: . Comprehensive medication review performed and list updated in the EMR . Refill request faxed to [employer] to obtain free medication (injecting Tresiba 7 units daily) . Requested call back from [employer] regarding application for [place].  Of note, patient is already approved for Ozempic until 12/2018.  [employer] requests to put daily dose in max dose box and resend form for dose changes/titrations.  [employer] representative suggested I call back within 24-48 hours to follow up on new request. . Will continue to follow.  When patient's medication is approved, it will ship to PCP office.   Patient Self Care Activities:  . Self administers medications as prescribed . Calls pharmacy for medication refills . Calls provider office for new concerns or  questions  Please see past updates related to this goal by clicking on the "Past Updates" button in the selected goal         The patient verbalized understanding of instructions provided today and declined a print copy of patient instruction materials.   The care management team will reach out to the patient again over the next 3 business days.   Regina Eck, PharmD, BCPS Clinical Pharmacist, [facility] [place]: [phone]

## 2018-08-18 ENCOUNTER — Ambulatory Visit: Payer: Self-pay | Admitting: Pharmacist

## 2018-08-18 DIAGNOSIS — N183 Chronic kidney disease, stage 3 unspecified: Secondary | ICD-10-CM

## 2018-08-18 DIAGNOSIS — E1122 Type 2 diabetes mellitus with diabetic chronic kidney disease: Secondary | ICD-10-CM

## 2018-08-18 DIAGNOSIS — I129 Hypertensive chronic kidney disease with stage 1 through stage 4 chronic kidney disease, or unspecified chronic kidney disease: Secondary | ICD-10-CM

## 2018-08-18 DIAGNOSIS — K7469 Other cirrhosis of liver: Secondary | ICD-10-CM

## 2018-08-18 NOTE — Telephone Encounter (Signed)
This encounter was created in error - please disregard.

## 2018-08-18 NOTE — Patient Instructions (Signed)
Visit Information  Goals Addressed            This Visit's Progress     Patient Stated   . "I am having trouble affording my Ozempic and Tresiba" (pt-stated)       . Current Barriers:  . Financial constraints  . Currently in a coverage gap . Awaiting outcome of patient assistance application  Clinical Social Work Clinical Goal(s):  [name] Over the next 20 days, patient will work with CCM team to address needs related to medication costs  CCM SW Interventions: Completed on 07/15/18 . Patient interviewed and appropriate assessments performed . Discussed plans with patient for ongoing care management follow up and provided patient with direct contact information for care management team  . Informed by patient she has applied for patient assistance program but has yet to hear outcome . Determined the patient is due for a medication refill within the next two days . Collaboration with primary care team to determine if samples are available for the patient . Collaboration with embedded PharmD Lottie Dawson regarding patient stated goal . Notified patient the office has samples of Tresiba available but not Ozempic  CCM PharmD Interventions: . Comprehensive medication review performed and list updated in the EMR . Refill request faxed to [employer] to obtain free medication (injecting Tresiba 7 units daily) . Requested call back from [employer] regarding application for [place].  Of note, patient is already approved for Ozempic until 12/2018.  Re-sent paperwork on 08/18/18-->[employer] requests to put daily dose in max dose box and resend form for dose changes/titrations.  [employer] representative suggested I call back within 24-48 hours to follow up on new request. . Will continue to follow.  When patient's medication is approved, it will ship to PCP office in 10-14 business days.   Patient Self Care Activities:  . Self administers medications as prescribed . Calls pharmacy for medication  refills . Calls provider office for new concerns or questions  Please see past updates related to this goal by clicking on the "Past Updates" button in the selected goal      . "I am having trouble managing my blood sugar" (pt-stated)       Current Barriers:  . Social Isolation due to COVID 19 . Inability to attend regular exercise program due to COVID 19 . Recent weight gain of 4 pounds  Clinical Social Work Clinical Goal(s):  [name] Over the next 45 days, patient will work with CCM RN Case Manager to address needs related to disease management  Interventions: . Patient interviewed and appropriate assessments performed . Discussed plans with patient for ongoing care management follow up and provided patient with direct contact information for care management team  . Gathered history from the patient - the patient reports she underwent bariatric surgery in 2018 and normally participates in water aerobics. Due to COVID 19 the patients exercise regimen has been interrupted. The patient has gained 4 pounds and is noticing her blood sugar is running higher than normal . Patient is on her way to exercise at the [facility] pool downtown.  She has signed up for a pool time slot due to Early.  She is looking forward to getting back into her exercise routine.  She is still using precautions. . Provided the patient with education surrounding the CCM program and roles of each care team member . Collaboration with CCM RN Case Manager who will follow up with the patient regarding patient stated goal  Patient Self Care  Activities:  . Self administers medications as prescribed . Attends all scheduled provider appointments . Performs ADL's independently  Please see past updates related to this goal by clicking on the "Past Updates" button in the selected goal         The patient verbalized understanding of instructions provided today and declined a print copy of patient instruction materials.   The care  management team will reach out to the patient again over the next 3-5 business days.  Regina Eck, PharmD, BCPS Clinical Pharmacist, [facility] [place]: [phone]

## 2018-08-18 NOTE — Progress Notes (Signed)
Chronic Care Management   Visit Note  08/18/2018 Name: Teresa Golden MRN: [id number] DOB: 18-Oct-1945  Referred by: Glendale Chard, MD Reason for referral : Chronic Care Management   Teresa Golden is a 73 y.o. year old female who is a primary care patient of Glendale Chard, MD. The CCM team was consulted for assistance with chronic disease management and care coordination needs.   Review of patient status, including review of consultants reports, relevant laboratory and other test results, and collaboration with appropriate care team members and the patient's provider was performed as part of comprehensive patient evaluation and provision of chronic care management services.    I spoke with Teresa Golden by telephone today.  Objective:   Goals Addressed            This Visit's Progress     Patient Stated   . "I am having trouble affording my Ozempic and Tresiba" (pt-stated)       . Current Barriers:  . Financial constraints  . Currently in a coverage gap . Awaiting outcome of patient assistance application  Clinical Social Work Clinical Goal(s):  Teresa Golden Over the next 20 days, patient will work with CCM team to address needs related to medication costs  CCM SW Interventions: Completed on 07/15/18 . Patient interviewed and appropriate assessments performed . Discussed plans with patient for ongoing care management follow up and provided patient with direct contact information for care management team  . Informed by patient she has applied for patient assistance program but has yet to hear outcome . Determined the patient is due for a medication refill within the next two days . Collaboration with primary care team to determine if samples are available for the patient . Collaboration with embedded PharmD Lottie Dawson regarding patient stated goal . Notified patient the office has samples of Tresiba available but not Ozempic  CCM PharmD Interventions: . Comprehensive medication review  performed and list updated in the EMR . Refill request faxed to [employer] to obtain free medication (injecting Tresiba 7 units daily) . Requested call back from [employer] regarding application for [place].  Of note, patient is already approved for Ozempic until 12/2018.  Re-sent paperwork on 08/18/18-->[employer] requests to put daily dose in max dose box and resend form for dose changes/titrations.  [employer] representative suggested I call back within 24-48 hours to follow up on new request. . Will continue to follow.  When patient's medication is approved, it will ship to PCP office in 10-14 business days.   Patient Self Care Activities:  . Self administers medications as prescribed . Calls pharmacy for medication refills . Calls provider office for new concerns or questions  Please see past updates related to this goal by clicking on the "Past Updates" button in the selected goal      . "I am having trouble managing my blood sugar" (pt-stated)       Current Barriers:  . Social Isolation due to COVID 19 . Inability to attend regular exercise program due to COVID 19 . Recent weight gain of 4 pounds  Clinical Social Work Clinical Goal(s):  Teresa Golden Over the next 45 days, patient will work with CCM RN Case Manager to address needs related to disease management  Interventions: . Patient interviewed and appropriate assessments performed . Discussed plans with patient for ongoing care management follow up and provided patient with direct contact information for care management team  . Gathered history from the patient - the patient reports she  underwent bariatric surgery in 2018 and normally participates in water aerobics. Due to COVID 19 the patients exercise regimen has been interrupted. The patient has gained 4 pounds and is noticing her blood sugar is running higher than normal . Patient is on her way to exercise at the [facility] pool downtown.  She has signed up for a pool time slot due to [place].   She is looking forward to getting back into her exercise routine.  She is still using precautions. . Provided the patient with education surrounding the CCM program and roles of each care team member . Collaboration with CCM RN Case Manager who will follow up with the patient regarding patient stated goal  Patient Self Care Activities:  . Self administers medications as prescribed . Attends all scheduled provider appointments . Performs ADL's independently  Please see past updates related to this goal by clicking on the "Past Updates" button in the selected goal          Plan:   The care management team will reach out to the patient again over the next 3-5 business days.   Teresa Golden, PharmD, BCPS Clinical Pharmacist, [facility] [place]: [phone]

## 2018-08-20 ENCOUNTER — Ambulatory Visit: Payer: Self-pay | Admitting: Pharmacist

## 2018-08-20 DIAGNOSIS — E1122 Type 2 diabetes mellitus with diabetic chronic kidney disease: Secondary | ICD-10-CM

## 2018-08-20 DIAGNOSIS — I129 Hypertensive chronic kidney disease with stage 1 through stage 4 chronic kidney disease, or unspecified chronic kidney disease: Secondary | ICD-10-CM

## 2018-08-20 DIAGNOSIS — N183 Chronic kidney disease, stage 3 unspecified: Secondary | ICD-10-CM

## 2018-08-20 NOTE — Patient Instructions (Signed)
Visit Information  Goals Addressed            This Visit's Progress     Patient Stated   . "I am having trouble affording my Ozempic and Tresiba" (pt-stated)       . Current Barriers:  . Financial constraints  . Currently in a coverage gap . Awaiting outcome of patient assistance application  Clinical Social Work Clinical Goal(s):  [name] Over the next 20 days, patient will work with CCM team to address needs related to medication costs  CCM SW Interventions: Completed on 07/15/18 . Patient interviewed and appropriate assessments performed . Discussed plans with patient for ongoing care management follow up and provided patient with direct contact information for care management team  . Informed by patient she has applied for patient assistance program but has yet to hear outcome . Determined the patient is due for a medication refill within the next two days . Collaboration with primary care team to determine if samples are available for the patient . Collaboration with embedded PharmD Lottie Dawson regarding patient stated goal . Notified patient the office has samples of Tresiba available but not Ozempic  CCM PharmD Interventions: . Comprehensive medication review performed and list updated in the EMR . Refill request faxed to [employer] to obtain free medication (injecting Tresiba 7 units daily) . Requested call back from [employer] regarding application for [place].  Of note, patient is already approved for Ozempic until 12/2018.   . Call placed to [employer] on 08/19/18 to follow up on application status.  Patient approved as of 08/19/18 for Tresiba FlexPen 100u/mL.  Shipment will arrive in 10-14 business days. . Will continue to follow.     Patient Self Care Activities:  . Self administers medications as prescribed . Calls pharmacy for medication refills . Calls provider office for new concerns or questions  Please see past updates related to this goal by clicking on the  "Past Updates" button in the selected goal         The patient verbalized understanding of instructions provided today and declined a print copy of patient instruction materials.   The care management team will reach out to the patient again over the next 14 days.   Regina Eck, PharmD, BCPS Clinical Pharmacist, [facility] [place]: [phone]

## 2018-08-20 NOTE — Progress Notes (Signed)
  Chronic Care Management   Visit Note  08/20/2018 Name: Teresa Golden MRN: [id number] DOB: February 20, 1946  Referred by: Glendale Chard, MD Reason for referral : Chronic Care Management   Teresa Golden is a 73 y.o. year old female who is a primary care patient of Glendale Chard, MD. The CCM team was consulted for assistance with chronic disease management and care coordination needs.   Review of patient status, including review of consultants reports, relevant laboratory and other test results, and collaboration with appropriate care team members and the patient's provider was performed as part of comprehensive patient evaluation and provision of chronic care management services.    Objective:   Goals Addressed            This Visit's Progress     Patient Stated   . "I am having trouble affording my Ozempic and Tresiba" (pt-stated)       . Current Barriers:  . Financial constraints  . Currently in a coverage gap . Awaiting outcome of patient assistance application  Clinical Social Work Clinical Goal(s):  [name] Over the next 20 days, patient will work with CCM team to address needs related to medication costs  CCM SW Interventions: Completed on 07/15/18 . Patient interviewed and appropriate assessments performed . Discussed plans with patient for ongoing care management follow up and provided patient with direct contact information for care management team  . Informed by patient she has applied for patient assistance program but has yet to hear outcome . Determined the patient is due for a medication refill within the next two days . Collaboration with primary care team to determine if samples are available for the patient . Collaboration with embedded PharmD Lottie Dawson regarding patient stated goal . Notified patient the office has samples of Tresiba available but not Ozempic  CCM PharmD Interventions: . Comprehensive medication review performed and list updated in the  EMR . Refill request faxed to [employer] to obtain free medication (injecting Tresiba 7 units daily) . Requested call back from [employer] regarding application for [place].  Of note, patient is already approved for Ozempic until 12/2018.   . Call placed to [employer] on 08/19/18 to follow up on application status.  Patient approved as of 08/19/18 for Tresiba FlexPen 100u/mL.  Shipment will arrive in 10-14 business days. . Will continue to follow.     Patient Self Care Activities:  . Self administers medications as prescribed . Calls pharmacy for medication refills . Calls provider office for new concerns or questions  Please see past updates related to this goal by clicking on the "Past Updates" button in the selected goal          Plan:   The care management team will reach out to the patient again over the next 14 days.    Teresa Golden, PharmD, BCPS Clinical Pharmacist, [facility] [place]: [phone]

## 2018-08-25 ENCOUNTER — Telehealth: Payer: Self-pay

## 2018-08-31 ENCOUNTER — Ambulatory Visit (INDEPENDENT_AMBULATORY_CARE_PROVIDER_SITE_OTHER): Payer: Medicare HMO | Admitting: Internal Medicine

## 2018-08-31 ENCOUNTER — Other Ambulatory Visit: Payer: Self-pay

## 2018-08-31 ENCOUNTER — Encounter: Payer: Self-pay | Admitting: Internal Medicine

## 2018-08-31 VITALS — BP 140/72 | HR 80 | Temp 98.4°F | Ht 64.4 in | Wt 223.6 lb

## 2018-08-31 DIAGNOSIS — N183 Chronic kidney disease, stage 3 unspecified: Secondary | ICD-10-CM

## 2018-08-31 DIAGNOSIS — Z6837 Body mass index (BMI) 37.0-37.9, adult: Secondary | ICD-10-CM

## 2018-08-31 DIAGNOSIS — I129 Hypertensive chronic kidney disease with stage 1 through stage 4 chronic kidney disease, or unspecified chronic kidney disease: Secondary | ICD-10-CM

## 2018-08-31 DIAGNOSIS — E1122 Type 2 diabetes mellitus with diabetic chronic kidney disease: Secondary | ICD-10-CM

## 2018-08-31 NOTE — Patient Instructions (Addendum)
Take losartan 50mg  twice daily, one at breakfast and one at dinner - f/u in four weeks.    Fall Prevention in the Home, Adult Falls can cause injuries. They can happen to people of all ages. There are many things you can do to make your home safe and to help prevent falls. Ask for help when making these changes, if needed. What actions can I take to prevent falls? General Instructions  Use good lighting in all rooms. Replace any light bulbs that burn out.  Turn on the lights when you go into a dark area. Use night-lights.  Keep items that you use often in easy-to-reach places. Lower the shelves around your home if necessary.  Set up your furniture so you have a clear path. Avoid moving your furniture around.  Do not have throw rugs and other things on the floor that can make you trip.  Avoid walking on wet floors.  If any of your floors are uneven, fix them.  Add color or contrast paint or tape to clearly mark and help you see: ? Any grab bars or handrails. ? First and last steps of stairways. ? Where the edge of each step is.  If you use a stepladder: ? Make sure that it is fully opened. Do not climb a closed stepladder. ? Make sure that both sides of the stepladder are locked into place. ? Ask someone to hold the stepladder for you while you use it.  If there are any pets around you, be aware of where they are. What can I do in the bathroom?      Keep the floor dry. Clean up any water that spills onto the floor as soon as it happens.  Remove soap buildup in the tub or shower regularly.  Use non-skid mats or decals on the floor of the tub or shower.  Attach bath mats securely with double-sided, non-slip rug tape.  If you need to sit down in the shower, use a plastic, non-slip stool.  Install grab bars by the toilet and in the tub and shower. Do not use towel bars as grab bars. What can I do in the bedroom?  Make sure that you have a light by your bed that is easy  to reach.  Do not use any sheets or blankets that are too big for your bed. They should not hang down onto the floor.  Have a firm chair that has side arms. You can use this for support while you get dressed. What can I do in the kitchen?  Clean up any spills right away.  If you need to reach something above you, use a strong step stool that has a grab bar.  Keep electrical cords out of the way.  Do not use floor polish or wax that makes floors slippery. If you must use wax, use non-skid floor wax. What can I do with my stairs?  Do not leave any items on the stairs.  Make sure that you have a light switch at the top of the stairs and the bottom of the stairs. If you do not have them, ask someone to add them for you.  Make sure that there are handrails on both sides of the stairs, and use them. Fix handrails that are broken or loose. Make sure that handrails are as long as the stairways.  Install non-slip stair treads on all stairs in your home.  Avoid having throw rugs at the top or bottom of the stairs.  If you do have throw rugs, attach them to the floor with carpet tape.  Choose a carpet that does not hide the edge of the steps on the stairway.  Check any carpeting to make sure that it is firmly attached to the stairs. Fix any carpet that is loose or worn. What can I do on the outside of my home?  Use bright outdoor lighting.  Regularly fix the edges of walkways and driveways and fix any cracks.  Remove anything that might make you trip as you walk through a door, such as a raised step or threshold.  Trim any bushes or trees on the path to your home.  Regularly check to see if handrails are loose or broken. Make sure that both sides of any steps have handrails.  Install guardrails along the edges of any raised decks and porches.  Clear walking paths of anything that might make someone trip, such as tools or rocks.  Have any leaves, snow, or ice cleared regularly.  Use  sand or salt on walking paths during winter.  Clean up any spills in your garage right away. This includes grease or oil spills. What other actions can I take?  Wear shoes that: ? Have a low heel. Do not wear high heels. ? Have rubber bottoms. ? Are comfortable and fit you well. ? Are closed at the toe. Do not wear open-toe sandals.  Use tools that help you move around (mobility aids) if they are needed. These include: ? Canes. ? Walkers. ? Scooters. ? Crutches.  Review your medicines with your doctor. Some medicines can make you feel dizzy. This can increase your chance of falling. Ask your doctor what other things you can do to help prevent falls. Where to find more information  Centers for Disease Control and Prevention, [employer]: https://garcia.biz/  [employer] on Aging: BrainJudge.co.uk Contact a doctor if:  You are afraid of falling at home.  You feel weak, drowsy, or dizzy at home.  You fall at home. Summary  There are many simple things that you can do to make your home safe and to help prevent falls.  Ways to make your home safe include removing tripping hazards and installing grab bars in the bathroom.  Ask for help when making these changes in your home. This information is not intended to replace advice given to you by your health care provider. Make sure you discuss any questions you have with your health care provider. Document Released: 12/07/2008 Document Revised: 06/03/2018 Document Reviewed: 09/25/2016 Elsevier Patient Education  2020 [employer].

## 2018-09-01 LAB — CMP14+EGFR
ALT: 17 IU/L (ref 0–32)
AST: 24 IU/L (ref 0–40)
Albumin/Globulin Ratio: 1.6 (ref 1.2–2.2)
Albumin: 4.2 g/dL (ref 3.7–4.7)
Alkaline Phosphatase: 79 IU/L (ref 39–117)
BUN/Creatinine Ratio: 17 (ref 12–28)
BUN: 19 mg/dL (ref 8–27)
Bilirubin Total: 0.5 mg/dL (ref 0.0–1.2)
CO2: 24 mmol/L (ref 20–29)
Calcium: 9.8 mg/dL (ref 8.7–10.3)
Chloride: 104 mmol/L (ref 96–106)
Creatinine, Ser: 1.09 mg/dL — ABNORMAL HIGH (ref 0.57–1.00)
GFR calc Af Amer: 58 mL/min/{1.73_m2} — ABNORMAL LOW (ref >59)
GFR calc non Af Amer: 50 mL/min/{1.73_m2} — ABNORMAL LOW (ref >59)
Globulin, Total: 2.7 g/dL (ref 1.5–4.5)
Glucose: 107 mg/dL — ABNORMAL HIGH (ref 65–99)
Potassium: 4.2 mmol/L (ref 3.5–5.2)
Sodium: 143 mmol/L (ref 134–144)
Total Protein: 6.9 g/dL (ref 6.0–8.5)

## 2018-09-01 LAB — HEMOGLOBIN A1C
Est. average glucose Bld gHb Est-mCnc: 137 mg/dL
Hgb A1c MFr Bld: 6.4 % — ABNORMAL HIGH (ref 4.8–5.6)

## 2018-09-05 NOTE — Progress Notes (Signed)
Subjective:     [name] , female    DOB: [birth year] , 73 y.o.   MRN: [id number]   Chief Complaint  Patient presents with  . Diabetes  . Hypertension    HPI  Diabetes She presents for her follow-up diabetic visit. She has type 2 diabetes mellitus. There are no hypoglycemic associated symptoms. Pertinent negatives for diabetes include no blurred vision and no chest pain. There are no hypoglycemic complications. Risk factors for coronary artery disease include diabetes mellitus, dyslipidemia, hypertension, obesity, sedentary lifestyle and post-menopausal. She is following a diabetic diet. She participates in exercise intermittently. Her home blood glucose trend is fluctuating minimally. Her breakfast blood glucose is taken between 8-9 am. Her breakfast blood glucose range is generally 110-130 mg/dl. An ACE inhibitor/angiotensin II receptor blocker is being taken. Eye exam is current.  Hypertension This is a chronic problem. The current episode started more than 1 year ago. The problem has been gradually improving since onset. The problem is uncontrolled. Pertinent negatives include no blurred vision or chest pain. Risk factors for coronary artery disease include diabetes mellitus, dyslipidemia, post-menopausal state and sedentary lifestyle. Past treatments include angiotensin blockers and beta blockers. The current treatment provides moderate improvement.     Past Medical History:  Diagnosis Date  . Arthritis   . Chronic kidney disease    self reports ckd stage 3   . Diabetes mellitus, type II, insulin dependent ([place])    With neurologic complications. Bilateral lower extremity peripheral neuropathy  . Dyspnea    with excertion; no issues now since weight loss surgery   . Endometriosis   . Essential hypertension   . GERD (gastroesophageal reflux disease)   . 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 Hypertensive nephropathy 07/05/2018   . Hypothyroidism   . Hypothyroidism 02/06/2018  . Morbid obesity with BMI of 50.0-59.9, adult ([place])   . Scoliosis      Family History  Problem Relation Age of Onset  . Heart attack Mother   . Heart failure Mother   . Stroke Father      Current Outpatient Medications:  .  B Complex-C (B-COMPLEX WITH VITAMIN C) tablet, Take 1 tablet by mouth daily., Disp: , Rfl:  .  carvedilol (COREG CR) 20 MG 24 hr capsule, TAKE 1 CAPSULE DAILY, Disp: 90 capsule, Rfl: 2 .  Cholecalciferol 5000 units TABS, Take 5,000 Units by mouth daily., Disp: , Rfl:  .  colchicine 0.6 MG tablet, Take 1 tablet (0.6 mg total) by mouth daily., Disp: 10 tablet, Rfl: 0 .  Cyanocobalamin (VITAMIN B-12) 2500 MCG SUBL, Take 2,500 mcg by mouth daily., Disp: , Rfl:  .  diclofenac sodium (VOLTAREN) 1 % GEL, APPLY 2 GRAMS TO AFFECTED AREA(S) 4 TIMES DAILY AS NEEDED, Disp: 200 g, Rfl: 2 .  gabapentin (NEURONTIN) 300 MG capsule, Take 300 mg by mouth at bedtime. , Disp: , Rfl:  .  insulin degludec (TRESIBA FLEXTOUCH) 100 UNIT/ML SOPN FlexTouch Pen, Inject 0.09 mLs (9 Units total) into the skin daily at 10 pm. (Patient taking differently: Inject 7 Units into the skin daily at 10 pm. ), Disp: 5 pen, Rfl: 2 .  levothyroxine (SYNTHROID, LEVOTHROID) 25 MCG tablet, Take 12.5-25 mcg by mouth See admin instructions. Take 0.5 tablet (12.5 mcg) by mouth on Mondays through Fridays, then take 1 tablet (25 mcg) by mouth on Saturdays and Sundays., Disp: , Rfl:  .  losartan (  COZAAR) 50 MG tablet, Take 1 tablet (50 mg total) by mouth daily., Disp: 90 tablet, Rfl: 1 .  Multiple Vitamins-Minerals (HAIR/SKIN/NAILS/BIOTIN PO), Take 1 tablet by mouth daily., Disp: , Rfl:  .  Multiple Vitamins-Minerals (MULTIVITAMIN WITH MINERALS) tablet, Take 1 tablet by mouth daily. Women's 50+, Disp: , Rfl:  .  naproxen sodium (ANAPROX) 220 MG tablet, Take 440 mg by mouth 2 (two) times daily as needed (pain). , Disp: , Rfl:  .  OZEMPIC, 1 MG/DOSE, 2 MG/1.5ML SOPN,  INJECT INTO SKIN ONCE A WEEK, Disp: 9 pen, Rfl: 1 .  simvastatin (ZOCOR) 10 MG tablet, Take 1 tablet (10 mg total) by mouth daily. (Patient taking differently: Take 10 mg by mouth daily. Taking on Monday Wednesday Friday), Disp: 30 tablet, Rfl: 1 .  docusate sodium (COLACE) 100 MG capsule, Take 1 capsule (100 mg total) by mouth 2 (two) times daily. (Patient not taking: Reported on 07/05/2018), Disp: 60 capsule, Rfl: 1   Allergies  Allergen Reactions  . Meloxicam Other (See Comments)    Fever; muscle aches; "flu-like" symptoms  . Other     Rose fever and hay fever      Review of Systems  Constitutional: Negative.   Eyes: Negative for blurred vision.  Respiratory: Negative.   Cardiovascular: Negative.  Negative for chest pain.  Gastrointestinal: Negative.   Neurological: Negative.   Psychiatric/Behavioral: Negative.      Today's Vitals   08/31/18 1028 08/31/18 1124  BP: (!) 154/70 140/72  Pulse: 80   Temp: 98.4 F (36.9 C)   TempSrc: Oral   Weight: 223 lb 9.6 oz (101.4 kg)   Height: 5' 4.4" (1.636 m)   PainSc: 0-No pain    Body mass index is 37.91 kg/m.   Objective:  Physical Exam Vitals signs and nursing note reviewed.  Constitutional:      Appearance: Normal appearance.  HENT:     Head: Normocephalic and atraumatic.  Cardiovascular:     Rate and Rhythm: Normal rate and regular rhythm.     Heart sounds: Normal heart sounds.  Pulmonary:     Effort: Pulmonary effort is normal.     Breath sounds: Normal breath sounds.  Musculoskeletal:     Comments: Ambulatory with rolling walker  Skin:    General: Skin is warm.  Neurological:     General: No focal deficit present.     Mental Status: She is alert.  Psychiatric:        Mood and Affect: Mood normal.        Behavior: Behavior normal.         Assessment And Plan:     1. Diabetes mellitus with stage 3 chronic kidney disease ([name])  I will check labs as listed below. She is encouraged to resume her regular  exercise regimen as well. She will consider resuming water aerobics.  - CMP14+EGFR - Hemoglobin A1c  2. Hypertensive nephropathy  Fair control. Pt advised that I will need to increase her losartan if bp elevated at next visit. She admits to using salt in her foods. She is encouraged to avoid adding salt to her foods. Also encouraged to cut back on intake of processed foods including deli meats and packaged foods which tend to be high in sodium.   3. Class 2 severe obesity due to excess calories with serious comorbidity and body mass index (BMI) of 37.0 to 37.9 in adult [facility])  Importance of achieving optimal weight to decrease risk of cardiovascular disease and cancers  was discussed with the patient in full detail. She is encouraged to start slowly - start with 10 minutes twice daily at least three to four days per week and to gradually build to 30 minutes five days weekly. She was given tips to incorporate more activity into her daily routine - take stairs when possible, park farther away from grocery stores, etc.    Maximino Greenland, MD    THE PATIENT IS ENCOURAGED TO PRACTICE SOCIAL DISTANCING DUE TO THE COVID-19 PANDEMIC.

## 2018-09-09 ENCOUNTER — Ambulatory Visit (INDEPENDENT_AMBULATORY_CARE_PROVIDER_SITE_OTHER): Payer: Medicare HMO | Admitting: Psychiatry

## 2018-09-09 DIAGNOSIS — F509 Eating disorder, unspecified: Secondary | ICD-10-CM | POA: Diagnosis not present

## 2018-09-09 DIAGNOSIS — R69 Illness, unspecified: Secondary | ICD-10-CM | POA: Diagnosis not present

## 2018-09-09 DIAGNOSIS — F4320 Adjustment disorder, unspecified: Secondary | ICD-10-CM | POA: Diagnosis not present

## 2018-09-15 ENCOUNTER — Telehealth: Payer: Self-pay

## 2018-09-16 ENCOUNTER — Ambulatory Visit (INDEPENDENT_AMBULATORY_CARE_PROVIDER_SITE_OTHER): Payer: Medicare HMO | Admitting: Psychiatry

## 2018-09-16 DIAGNOSIS — F4323 Adjustment disorder with mixed anxiety and depressed mood: Secondary | ICD-10-CM | POA: Diagnosis not present

## 2018-09-16 DIAGNOSIS — F509 Eating disorder, unspecified: Secondary | ICD-10-CM

## 2018-09-16 DIAGNOSIS — R69 Illness, unspecified: Secondary | ICD-10-CM | POA: Diagnosis not present

## 2018-09-22 ENCOUNTER — Other Ambulatory Visit: Payer: Self-pay | Admitting: Internal Medicine

## 2018-09-23 ENCOUNTER — Ambulatory Visit (INDEPENDENT_AMBULATORY_CARE_PROVIDER_SITE_OTHER): Payer: Medicare HMO | Admitting: Psychiatry

## 2018-09-23 DIAGNOSIS — F4323 Adjustment disorder with mixed anxiety and depressed mood: Secondary | ICD-10-CM

## 2018-09-23 DIAGNOSIS — R69 Illness, unspecified: Secondary | ICD-10-CM | POA: Diagnosis not present

## 2018-09-23 DIAGNOSIS — F509 Eating disorder, unspecified: Secondary | ICD-10-CM

## 2018-09-27 DIAGNOSIS — E119 Type 2 diabetes mellitus without complications: Secondary | ICD-10-CM | POA: Diagnosis not present

## 2018-09-29 ENCOUNTER — Encounter: Payer: Self-pay | Admitting: Internal Medicine

## 2018-09-29 ENCOUNTER — Ambulatory Visit (INDEPENDENT_AMBULATORY_CARE_PROVIDER_SITE_OTHER): Payer: Medicare HMO | Admitting: Internal Medicine

## 2018-09-29 ENCOUNTER — Other Ambulatory Visit: Payer: Self-pay

## 2018-09-29 VITALS — BP 126/80 | HR 68 | Temp 98.2°F | Ht 64.4 in | Wt 217.8 lb

## 2018-09-29 DIAGNOSIS — E6609 Other obesity due to excess calories: Secondary | ICD-10-CM | POA: Diagnosis not present

## 2018-09-29 DIAGNOSIS — Z79899 Other long term (current) drug therapy: Secondary | ICD-10-CM | POA: Diagnosis not present

## 2018-09-29 DIAGNOSIS — N183 Chronic kidney disease, stage 3 unspecified: Secondary | ICD-10-CM

## 2018-09-29 DIAGNOSIS — I129 Hypertensive chronic kidney disease with stage 1 through stage 4 chronic kidney disease, or unspecified chronic kidney disease: Secondary | ICD-10-CM | POA: Diagnosis not present

## 2018-09-29 DIAGNOSIS — Z6836 Body mass index (BMI) 36.0-36.9, adult: Secondary | ICD-10-CM

## 2018-09-29 DIAGNOSIS — E1122 Type 2 diabetes mellitus with diabetic chronic kidney disease: Secondary | ICD-10-CM | POA: Diagnosis not present

## 2018-09-29 MED ORDER — LOSARTAN POTASSIUM 50 MG PO TABS: 50.0000 mg | ORAL_TABLET | Freq: Two times a day (BID) | ORAL | 1 refills | Status: DC

## 2018-09-29 NOTE — Progress Notes (Signed)
Subjective:     [name] , female    DOB: [birth year] , 73 y.o.   MRN: [id number]   Chief Complaint  Patient presents with  . Hypertension    HPI  She is here today for a bp check.  The dose of losartan was increased at her last visit. She has been taking losartan 90m twice daily.  She has not experienced any adverse side effects with this regimen.     Past Medical History:  Diagnosis Date  . Arthritis   . Chronic kidney disease    self reports ckd stage 3   . Diabetes mellitus, type II, insulin dependent ([place]    With neurologic complications. Bilateral lower extremity peripheral neuropathy  . Dyspnea    with excertion; no issues now since weight loss surgery   . Endometriosis   . Essential hypertension   . GERD (gastroesophageal reflux disease)   . 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 Hypertensive nephropathy 07/05/2018  . Hypothyroidism   . Hypothyroidism 02/06/2018  . Morbid obesity with BMI of 50.0-59.9, adult ([place]   . Scoliosis      Family History  Problem Relation Age of Onset  . Heart attack Mother   . Heart failure Mother   . Stroke Father      Current Outpatient Medications:  .  B Complex-C (B-COMPLEX WITH VITAMIN C) tablet, Take 1 tablet by mouth daily., Disp: , Rfl:  .  carvedilol (COREG CR) 20 MG 24 hr capsule, TAKE 1 CAPSULE DAILY, Disp: 90 capsule, Rfl: 2 .  Cholecalciferol 5000 units TABS, Take 5,000 Units by mouth daily., Disp: , Rfl:  .  colchicine 0.6 MG tablet, Take 1 tablet (0.6 mg total) by mouth daily., Disp: 10 tablet, Rfl: 0 .  Cyanocobalamin (VITAMIN B-12) 2500 MCG SUBL, Take 2,500 mcg by mouth daily., Disp: , Rfl:  .  diclofenac sodium (VOLTAREN) 1 % GEL, APPLY 2 GRAMS TO AFFECTED AREA(S) 4 TIMES DAILY AS NEEDED, Disp: 200 g, Rfl: 2 .  gabapentin (NEURONTIN) 300 MG capsule, Take 300 mg by mouth at bedtime. , Disp: , Rfl:  .  insulin degludec (TRESIBA FLEXTOUCH) 100 UNIT/ML SOPN  FlexTouch Pen, Inject 0.09 mLs (9 Units total) into the skin daily at 10 pm. (Patient taking differently: Inject 10 Units into the skin daily at 10 pm. ), Disp: 5 pen, Rfl: 2 .  levothyroxine (SYNTHROID, LEVOTHROID) 25 MCG tablet, Take 12.5-25 mcg by mouth See admin instructions. Take 0.5 tablet (12.5 mcg) by mouth on Mondays through Fridays, then take 1 tablet (25 mcg) by mouth on Saturdays and Sundays., Disp: , Rfl:  .  losartan (COZAAR) 50 MG tablet, Take 1 tablet (50 mg total) by mouth 2 (two) times daily., Disp: 180 tablet, Rfl: 1 .  Multiple Vitamins-Minerals (HAIR/SKIN/NAILS/BIOTIN PO), Take 1 tablet by mouth daily., Disp: , Rfl:  .  Multiple Vitamins-Minerals (MULTIVITAMIN WITH MINERALS) tablet, Take 1 tablet by mouth daily. Women's 50+, Disp: , Rfl:  .  naproxen sodium (ANAPROX) 220 MG tablet, Take 440 mg by mouth 2 (two) times daily as needed (pain). , Disp: , Rfl:  .  OZEMPIC, 1 MG/DOSE, 2 MG/1.5ML SOPN, INJECT INTO SKIN ONCE A WEEK, Disp: 9 pen, Rfl: 1 .  simvastatin (ZOCOR) 10 MG tablet, Take 1 tablet (10 mg total) by mouth daily. (Patient taking differently: Take 10 mg by mouth daily. Taking on Monday Wednesday Friday), Disp: 30  tablet, Rfl: 1 .  docusate sodium (COLACE) 100 MG capsule, Take 1 capsule (100 mg total) by mouth 2 (two) times daily. (Patient not taking: Reported on 07/05/2018), Disp: 60 capsule, Rfl: 1   Allergies  Allergen Reactions  . Meloxicam Other (See Comments)    Fever; muscle aches; "flu-like" symptoms  . Other     Rose fever and hay fever      Review of Systems  Constitutional: Negative.   Respiratory: Negative.   Cardiovascular: Negative.   Gastrointestinal: Negative.   Neurological: Negative.   Psychiatric/Behavioral: Negative.      Today's Vitals   09/29/18 1205  BP: 126/80  Pulse: 68  Temp: 98.2 F (36.8 C)  TempSrc: Oral  Weight: 217 lb 12.8 oz (98.8 kg)  Height: 5' 4.4" (1.636 m)  PainSc: 0-No pain   Body mass index is 36.92 kg/m.    Objective:  Physical Exam Vitals signs and nursing note reviewed.  Constitutional:      Appearance: Normal appearance.  HENT:     Head: Normocephalic and atraumatic.  Cardiovascular:     Rate and Rhythm: Normal rate and regular rhythm.     Pulses:          Dorsalis pedis pulses are 2+ on the right side and 2+ on the left side.     Heart sounds: Normal heart sounds.  Pulmonary:     Effort: Pulmonary effort is normal.     Breath sounds: Normal breath sounds.  Feet:     Right foot:     Protective Sensation: 5 sites tested. 5 sites sensed.     Skin integrity: Callus present. No skin breakdown or erythema.     Toenail Condition: Right toenails are normal.     Left foot:     Protective Sensation: 5 sites tested. 5 sites sensed.     Skin integrity: Callus present. No skin breakdown or erythema.     Toenail Condition: Left toenails are normal.  Skin:    General: Skin is warm.  Neurological:     General: No focal deficit present.     Mental Status: She is alert.  Psychiatric:        Mood and Affect: Mood normal.        Behavior: Behavior normal.         Assessment And Plan:     1. Hypertensive nephropathy  Chronic, well controlled. She was given 90 day rx losartan 65m twice daily. I will check a bmp today.   2. Chronic renal disease, stage III (HCC)  Chronic. She is encouraged to stay well hydrated.   3. Diabetes mellitus with stage 3 chronic kidney disease ([place]  Diabetic foot exam was performed today.  I DISCUSSED WITH THE PATIENT AT LENGTH REGARDING THE GOALS OF GLYCEMIC CONTROL AND POSSIBLE LONG-TERM COMPLICATIONS.  I  ALSO STRESSED THE IMPORTANCE OF COMPLIANCE WITH HOME GLUCOSE MONITORING, DIETARY RESTRICTIONS INCLUDING AVOIDANCE OF SUGARY DRINKS/PROCESSED FOODS,  ALONG WITH REGULAR EXERCISE.  I  ALSO STRESSED THE IMPORTANCE OF ANNUAL EYE EXAMS, SELF FOOT CARE AND COMPLIANCE WITH OFFICE VISITS.  4. Drug therapy  - BMP8+EGFR  5. Class 2 obesity due to excess  calories without serious comorbidity with body mass index (BMI) of 36.0 to 36.9 in adult  She was congratulated on her six pound weight loss since her last visit.   RMaximino Greenland MD    THE PATIENT IS ENCOURAGED TO PRACTICE SOCIAL DISTANCING DUE TO THE COVID-19 PANDEMIC.

## 2018-09-29 NOTE — Patient Instructions (Signed)
Diabetes Mellitus and Foot Care Foot care is an important part of your health, especially when you have diabetes. Diabetes may cause you to have problems because of poor blood flow (circulation) to your feet and legs, which can cause your skin to:  Become thinner and drier.  Break more easily.  Heal more slowly.  Peel and crack. You may also have nerve damage (neuropathy) in your legs and feet, causing decreased feeling in them. This means that you may not notice minor injuries to your feet that could lead to more serious problems. Noticing and addressing any potential problems early is the best way to prevent future foot problems. How to care for your feet Foot hygiene  Wash your feet daily with warm water and mild soap. Do not use hot water. Then, pat your feet and the areas between your toes until they are completely dry. Do not soak your feet as this can dry your skin.  Trim your toenails straight across. Do not dig under them or around the cuticle. File the edges of your nails with an emery board or nail file.  Apply a moisturizing lotion or petroleum jelly to the skin on your feet and to dry, brittle toenails. Use lotion that does not contain alcohol and is unscented. Do not apply lotion between your toes. Shoes and socks  Wear clean socks or stockings every day. Make sure they are not too tight. Do not wear knee-high stockings since they may decrease blood flow to your legs.  Wear shoes that fit properly and have enough cushioning. Always look in your shoes before you put them on to be sure there are no objects inside.  To break in new shoes, wear them for just a few hours a day. This prevents injuries on your feet. Wounds, scrapes, corns, and calluses  Check your feet daily for blisters, cuts, bruises, sores, and redness. If you cannot see the bottom of your feet, use a mirror or ask someone for help.  Do not cut corns or calluses or try to remove them with medicine.  If you  find a minor scrape, cut, or break in the skin on your feet, keep it and the skin around it clean and dry. You may clean these areas with mild soap and water. Do not clean the area with peroxide, alcohol, or iodine.  If you have a wound, scrape, corn, or callus on your foot, look at it several times a day to make sure it is healing and not infected. Check for: ? Redness, swelling, or pain. ? Fluid or blood. ? Warmth. ? Pus or a bad smell. General instructions  Do not cross your legs. This may decrease blood flow to your feet.  Do not use heating pads or hot water bottles on your feet. They may burn your skin. If you have lost feeling in your feet or legs, you may not know this is happening until it is too late.  Protect your feet from hot and cold by wearing shoes, such as at the beach or on hot pavement.  Schedule a complete foot exam at least once a year (annually) or more often if you have foot problems. If you have foot problems, report any cuts, sores, or bruises to your health care provider immediately. Contact a health care provider if:  You have a medical condition that increases your risk of infection and you have any cuts, sores, or bruises on your feet.  You have an injury that is not   healing.  You have redness on your legs or feet.  You feel burning or tingling in your legs or feet.  You have pain or cramps in your legs and feet.  Your legs or feet are numb.  Your feet always feel cold.  You have pain around a toenail. Get help right away if:  You have a wound, scrape, corn, or callus on your foot and: ? You have pain, swelling, or redness that gets worse. ? You have fluid or blood coming from the wound, scrape, corn, or callus. ? Your wound, scrape, corn, or callus feels warm to the touch. ? You have pus or a bad smell coming from the wound, scrape, corn, or callus. ? You have a fever. ? You have a red line going up your leg. Summary  Check your feet every day  for cuts, sores, red spots, swelling, and blisters.  Moisturize feet and legs daily.  Wear shoes that fit properly and have enough cushioning.  If you have foot problems, report any cuts, sores, or bruises to your health care provider immediately.  Schedule a complete foot exam at least once a year (annually) or more often if you have foot problems. This information is not intended to replace advice given to you by your health care provider. Make sure you discuss any questions you have with your health care provider. Document Released: 02/08/2000 Document Revised: 03/25/2017 Document Reviewed: 03/14/2016 Elsevier Patient Education  2020 Elsevier Inc.  

## 2018-09-30 ENCOUNTER — Ambulatory Visit (INDEPENDENT_AMBULATORY_CARE_PROVIDER_SITE_OTHER): Payer: Medicare HMO | Admitting: Psychiatry

## 2018-09-30 DIAGNOSIS — F4323 Adjustment disorder with mixed anxiety and depressed mood: Secondary | ICD-10-CM

## 2018-09-30 DIAGNOSIS — F509 Eating disorder, unspecified: Secondary | ICD-10-CM | POA: Diagnosis not present

## 2018-09-30 LAB — BMP8+EGFR
BUN/Creatinine Ratio: 11 — ABNORMAL LOW (ref 12–28)
BUN: 12 mg/dL (ref 8–27)
CO2: 24 mmol/L (ref 20–29)
Calcium: 9.6 mg/dL (ref 8.7–10.3)
Chloride: 105 mmol/L (ref 96–106)
Creatinine, Ser: 1.09 mg/dL — ABNORMAL HIGH (ref 0.57–1.00)
GFR calc Af Amer: 58 mL/min/{1.73_m2} — ABNORMAL LOW (ref >59)
GFR calc non Af Amer: 50 mL/min/{1.73_m2} — ABNORMAL LOW (ref >59)
Glucose: 118 mg/dL — ABNORMAL HIGH (ref 65–99)
Potassium: 4.3 mmol/L (ref 3.5–5.2)
Sodium: 142 mmol/L (ref 134–144)

## 2018-10-05 ENCOUNTER — Other Ambulatory Visit: Payer: Self-pay | Admitting: *Deleted

## 2018-10-05 DIAGNOSIS — Z20822 Contact with and (suspected) exposure to covid-19: Secondary | ICD-10-CM

## 2018-10-06 DIAGNOSIS — Z20828 Contact with and (suspected) exposure to other viral communicable diseases: Secondary | ICD-10-CM | POA: Diagnosis not present

## 2018-10-07 ENCOUNTER — Ambulatory Visit (INDEPENDENT_AMBULATORY_CARE_PROVIDER_SITE_OTHER): Payer: Medicare HMO | Admitting: Psychiatry

## 2018-10-07 DIAGNOSIS — F4323 Adjustment disorder with mixed anxiety and depressed mood: Secondary | ICD-10-CM

## 2018-10-07 DIAGNOSIS — E661 Drug-induced obesity: Secondary | ICD-10-CM | POA: Diagnosis not present

## 2018-10-07 DIAGNOSIS — R69 Illness, unspecified: Secondary | ICD-10-CM | POA: Diagnosis not present

## 2018-10-07 DIAGNOSIS — F509 Eating disorder, unspecified: Secondary | ICD-10-CM

## 2018-10-11 DIAGNOSIS — Z96651 Presence of right artificial knee joint: Secondary | ICD-10-CM | POA: Diagnosis not present

## 2018-10-13 ENCOUNTER — Ambulatory Visit (INDEPENDENT_AMBULATORY_CARE_PROVIDER_SITE_OTHER): Payer: Medicare HMO | Admitting: Pharmacist

## 2018-10-13 DIAGNOSIS — E1122 Type 2 diabetes mellitus with diabetic chronic kidney disease: Secondary | ICD-10-CM | POA: Diagnosis not present

## 2018-10-13 DIAGNOSIS — N183 Chronic kidney disease, stage 3 unspecified: Secondary | ICD-10-CM

## 2018-10-13 DIAGNOSIS — I129 Hypertensive chronic kidney disease with stage 1 through stage 4 chronic kidney disease, or unspecified chronic kidney disease: Secondary | ICD-10-CM | POA: Diagnosis not present

## 2018-10-14 ENCOUNTER — Ambulatory Visit (INDEPENDENT_AMBULATORY_CARE_PROVIDER_SITE_OTHER): Payer: Medicare HMO | Admitting: Psychiatry

## 2018-10-14 DIAGNOSIS — F509 Eating disorder, unspecified: Secondary | ICD-10-CM

## 2018-10-14 DIAGNOSIS — F4323 Adjustment disorder with mixed anxiety and depressed mood: Secondary | ICD-10-CM | POA: Diagnosis not present

## 2018-10-14 DIAGNOSIS — R69 Illness, unspecified: Secondary | ICD-10-CM | POA: Diagnosis not present

## 2018-10-18 ENCOUNTER — Telehealth: Payer: Self-pay

## 2018-10-18 NOTE — Progress Notes (Signed)
Chronic Care Management   Visit Note  10/12/2018 Name: Teresa Golden MRN: [id number] DOB: [birth year]  Referred by: Glendale Chard, MD Reason for referral : Chronic Care Management   Teresa Golden is a 73 y.o. year old female who is a primary care patient of Glendale Chard, MD. The CCM team was consulted for assistance with chronic disease management and care coordination needs.   Review of patient status, including review of consultants reports, relevant laboratory and other test results, and collaboration with appropriate care team members and the patient's provider was performed as part of comprehensive patient evaluation and provision of chronic care management services.     Medications: Outpatient Encounter Medications as of 10/13/2018  Medication Sig  . B Complex-C (B-COMPLEX WITH VITAMIN C) tablet Take 1 tablet by mouth daily.  . carvedilol (COREG CR) 20 MG 24 hr capsule TAKE 1 CAPSULE DAILY  . Cholecalciferol 5000 units TABS Take 5,000 Units by mouth daily.  . colchicine 0.6 MG tablet Take 1 tablet (0.6 mg total) by mouth daily.  . Cyanocobalamin (VITAMIN B-12) 2500 MCG SUBL Take 2,500 mcg by mouth daily.  . diclofenac sodium (VOLTAREN) 1 % GEL APPLY 2 GRAMS TO AFFECTED AREA(S) 4 TIMES DAILY AS NEEDED  . docusate sodium (COLACE) 100 MG capsule Take 1 capsule (100 mg total) by mouth 2 (two) times daily. (Patient not taking: Reported on 07/05/2018)  . gabapentin (NEURONTIN) 300 MG capsule Take 300 mg by mouth at bedtime.   . insulin degludec (TRESIBA FLEXTOUCH) 100 UNIT/ML SOPN FlexTouch Pen Inject 0.09 mLs (9 Units total) into the skin daily at 10 pm. (Patient taking differently: Inject 10 Units into the skin daily at 10 pm. )  . levothyroxine (SYNTHROID, LEVOTHROID) 25 MCG tablet Take 12.5-25 mcg by mouth See admin instructions. Take 0.5 tablet (12.5 mcg) by mouth on Mondays through Fridays, then take 1 tablet (25 mcg) by mouth on Saturdays and Sundays.  [name] losartan (COZAAR) 50 MG  tablet Take 1 tablet (50 mg total) by mouth 2 (two) times daily.  . Multiple Vitamins-Minerals (HAIR/SKIN/NAILS/BIOTIN PO) Take 1 tablet by mouth daily.  . Multiple Vitamins-Minerals (MULTIVITAMIN WITH MINERALS) tablet Take 1 tablet by mouth daily. Women's 50+  . naproxen sodium (ANAPROX) 220 MG tablet Take 440 mg by mouth 2 (two) times daily as needed (pain).   [name] OZEMPIC, 1 MG/DOSE, 2 MG/1.5ML SOPN INJECT INTO SKIN ONCE A WEEK  . simvastatin (ZOCOR) 10 MG tablet Take 1 tablet (10 mg total) by mouth daily. (Patient taking differently: Take 10 mg by mouth daily. Taking on Monday Wednesday Friday)      Objective:   Goals Addressed            This Visit's Progress     Patient Stated   . COMPLETED: "I am having trouble affording my Ozempic and Tresiba" (pt-stated)       . Current Barriers:  . Financial constraints  . Currently in a coverage gap . Awaiting outcome of patient assistance application  Clinical Social Work Clinical Goal(s):  [name] Over the next 20 days, patient will work with CCM team to address needs related to medication costs  CCM SW Interventions: Completed on 07/15/18 . Patient interviewed and appropriate assessments performed . Discussed plans with patient for ongoing care management follow up and provided patient with direct contact information for care management team  . Informed by patient she has applied for patient assistance program but has yet to hear outcome . Determined the patient is  due for a medication refill within the next two days . Collaboration with primary care team to determine if samples are available for the patient . Collaboration with embedded PharmD Lottie Dawson regarding patient stated goal . Notified patient the office has samples of Tresiba available but not Ozempic  CCM PharmD Interventions: . Comprehensive medication review performed and list updated in the EMR . Refill request faxed to [employer] to obtain free medication (injecting  Tresiba 10 units daily) . Requested call back from [employer] regarding application for [place].  Of note, patient is already approved for Ozempic until 12/2018.   . Call placed to [employer] on 08/19/18 to follow up on application status.  Patient approved as of 08/19/18 for Tresiba FlexPen 100u/mL.  Shipment has arrived and patient has picked up . Refill request submitted to [employer] on 10/12/18.   Patient Self Care Activities:  . Self administers medications as prescribed . Calls pharmacy for medication refills . Calls provider office for new concerns or questions  Please see past updates related to this goal by clicking on the "Past Updates" button in the selected goal      . I want to continue to optimize medication management of my chronic conditons (pt-stated)       Current Barriers:  . Non Adherence to prescribed medication regimen . Financial Barriers  Pharmacist Clinical Goal(s):  [name] Over the next 90 days, patient will work with CCM team & PCP to address needs related to optimized medication management of chronic conditions.  Interventions: . Comprehensive medication review performed. . Advised patient to continue eating an ADA recommended diet.  Reviewed food options for snacks, etc.   . Patient reports FBGs mostly in the 90-100s.  She states her BG never exceed 140.  She denies hypoglycemia. . She is motivated to continue her progress.  She is determined to not gain weight, however is unable to exercise  as much as she did pre-COVID.  She enjoys swimming at the [facility]. . Diabetes regimen optimized at this time with [place] 7 units daily and weekly Ozempic. [name] Patient reports taking statin therapy MWF and reports compliance, however patient has not filled this year per fill data report.  Will continue to encourage patient to fill statin or discover alternatives.  Was unable to reach patient today.  Will outreach next week. Also, encouraged patient to continue taking aspiring  81mg  as prescribed by MD . Will continue to follow   Patient Self Care Activities:  . Self administers medications as prescribed . Attends all scheduled provider appointments . Calls pharmacy for medication refills  Please see past updates related to this goal by clicking on the "Past Updates" button in the selected goal         Plan:   A HIPPA compliant phone message was left for the patient providing contact information and requesting a return call.  The care management team will reach out to the patient again over the next 7 days.   Regina Eck, PharmD, BCPS Clinical Pharmacist, [facility] [place]: [phone]

## 2018-10-18 NOTE — Patient Instructions (Signed)
Visit Information  Goals Addressed            This Visit's Progress     Patient Stated   . COMPLETED: "I am having trouble affording my Ozempic and Tresiba" (pt-stated)       . Current Barriers:  . Financial constraints  . Currently in a coverage gap . Awaiting outcome of patient assistance application  Clinical Social Work Clinical Goal(s):  [name] Over the next 20 days, patient will work with CCM team to address needs related to medication costs  CCM SW Interventions: Completed on 07/15/18 . Patient interviewed and appropriate assessments performed . Discussed plans with patient for ongoing care management follow up and provided patient with direct contact information for care management team  . Informed by patient she has applied for patient assistance program but has yet to hear outcome . Determined the patient is due for a medication refill within the next two days . Collaboration with primary care team to determine if samples are available for the patient . Collaboration with embedded PharmD Lottie Dawson regarding patient stated goal . Notified patient the office has samples of Tresiba available but not Ozempic  CCM PharmD Interventions: . Comprehensive medication review performed and list updated in the EMR . Refill request faxed to [employer] to obtain free medication (injecting Tresiba 10 units daily) . Requested call back from [employer] regarding application for [place].  Of note, patient is already approved for Ozempic until 12/2018.   . Call placed to [employer] on 08/19/18 to follow up on application status.  Patient approved as of 08/19/18 for Tresiba FlexPen 100u/mL.  Shipment has arrived and patient has picked up . Refill request submitted to [employer] on 10/12/18.   Patient Self Care Activities:  . Self administers medications as prescribed . Calls pharmacy for medication refills . Calls provider office for new concerns or questions  Please see past updates  related to this goal by clicking on the "Past Updates" button in the selected goal      . I want to continue to optimize medication management of my chronic conditons (pt-stated)       Current Barriers:  . Non Adherence to prescribed medication regimen . Financial Barriers  Pharmacist Clinical Goal(s):  [name] Over the next 90 days, patient will work with CCM team & PCP to address needs related to optimized medication management of chronic conditions.  Interventions: . Comprehensive medication review performed. . Advised patient to continue eating an ADA recommended diet.  Reviewed food options for snacks, etc.   . Patient reports FBGs mostly in the 90-100s.  She states her BG never exceed 140.  She denies hypoglycemia. . She is motivated to continue her progress.  She is determined to not gain weight, however is unable to exercise  as much as she did pre-COVID.  She enjoys swimming at the [facility]. . Diabetes regimen optimized at this time with [place] 7 units daily and weekly Ozempic. [name] Patient reports taking statin therapy MWF and reports compliance, however patient has not filled this year per fill data report.  Will continue to encourage patient to fill statin or discover alternatives.  Was unable to reach patient today.  Will outreach next week. Also, encouraged patient to continue taking aspiring 81mg  as prescribed by MD . Will continue to follow   Patient Self Care Activities:  . Self administers medications as prescribed . Attends all scheduled provider appointments . Calls pharmacy for medication refills  Please see past  updates related to this goal by clicking on the "Past Updates" button in the selected goal         The patient verbalized understanding of instructions provided today and declined a print copy of patient instruction materials.   A HIPPA compliant phone message was left for the patient providing contact information and requesting a return call.  The care management  team will reach out to the patient again over the next 7 days.   Regina Eck, PharmD, BCPS Clinical Pharmacist, [facility] [place]: [phone]

## 2018-10-20 ENCOUNTER — Telehealth: Payer: Self-pay

## 2018-10-20 DIAGNOSIS — M546 Pain in thoracic spine: Secondary | ICD-10-CM | POA: Diagnosis not present

## 2018-10-21 ENCOUNTER — Ambulatory Visit (INDEPENDENT_AMBULATORY_CARE_PROVIDER_SITE_OTHER): Payer: Medicare HMO | Admitting: Psychiatry

## 2018-10-21 DIAGNOSIS — F509 Eating disorder, unspecified: Secondary | ICD-10-CM

## 2018-10-21 DIAGNOSIS — F4323 Adjustment disorder with mixed anxiety and depressed mood: Secondary | ICD-10-CM | POA: Diagnosis not present

## 2018-10-21 DIAGNOSIS — R69 Illness, unspecified: Secondary | ICD-10-CM | POA: Diagnosis not present

## 2018-10-25 NOTE — Addendum Note (Signed)
Addended by: Brigitte Pulse on: 10/25/2018 11:56 AM   Modules accepted: Orders

## 2018-10-26 ENCOUNTER — Other Ambulatory Visit: Payer: Self-pay | Admitting: Internal Medicine

## 2018-10-27 ENCOUNTER — Telehealth: Payer: Self-pay

## 2018-10-28 ENCOUNTER — Ambulatory Visit (INDEPENDENT_AMBULATORY_CARE_PROVIDER_SITE_OTHER): Payer: Medicare HMO | Admitting: Psychiatry

## 2018-10-28 ENCOUNTER — Ambulatory Visit: Payer: Self-pay | Admitting: Pharmacist

## 2018-10-28 DIAGNOSIS — M545 Low back pain: Secondary | ICD-10-CM | POA: Diagnosis not present

## 2018-10-28 DIAGNOSIS — I129 Hypertensive chronic kidney disease with stage 1 through stage 4 chronic kidney disease, or unspecified chronic kidney disease: Secondary | ICD-10-CM

## 2018-10-28 DIAGNOSIS — E1122 Type 2 diabetes mellitus with diabetic chronic kidney disease: Secondary | ICD-10-CM

## 2018-10-28 DIAGNOSIS — F509 Eating disorder, unspecified: Secondary | ICD-10-CM

## 2018-10-28 DIAGNOSIS — R69 Illness, unspecified: Secondary | ICD-10-CM | POA: Diagnosis not present

## 2018-10-28 DIAGNOSIS — F4323 Adjustment disorder with mixed anxiety and depressed mood: Secondary | ICD-10-CM

## 2018-10-28 DIAGNOSIS — N183 Chronic kidney disease, stage 3 unspecified: Secondary | ICD-10-CM

## 2018-11-02 ENCOUNTER — Ambulatory Visit: Payer: Self-pay | Admitting: Pharmacist

## 2018-11-02 DIAGNOSIS — N183 Chronic kidney disease, stage 3 unspecified: Secondary | ICD-10-CM

## 2018-11-02 DIAGNOSIS — E1122 Type 2 diabetes mellitus with diabetic chronic kidney disease: Secondary | ICD-10-CM

## 2018-11-02 NOTE — Progress Notes (Signed)
  Chronic Care Management   Outreach Note  11/02/2018 Name: Teresa Golden MRN: [id number] DOB: [birth year]  Referred by: Glendale Chard, MD Reason for referral : Chronic Care Management   A second unsuccessful telephone outreach was attempted today. The patient was referred to the case management team for assistance with chronic care management and care coordination.   Follow Up Plan: A HIPPA compliant phone message was left for the patient providing contact information and requesting a return call.  The care management team will reach out to the patient again over the next 7-10 business days.   Regina Eck, PharmD, BCPS Clinical Pharmacist, [facility] [place]: [phone]

## 2018-11-03 DIAGNOSIS — R69 Illness, unspecified: Secondary | ICD-10-CM | POA: Diagnosis not present

## 2018-11-04 ENCOUNTER — Ambulatory Visit: Payer: Medicare HMO | Admitting: Psychiatry

## 2018-11-05 DIAGNOSIS — L602 Onychogryphosis: Secondary | ICD-10-CM | POA: Diagnosis not present

## 2018-11-05 DIAGNOSIS — E1351 Other specified diabetes mellitus with diabetic peripheral angiopathy without gangrene: Secondary | ICD-10-CM | POA: Diagnosis not present

## 2018-11-09 ENCOUNTER — Other Ambulatory Visit: Payer: Self-pay

## 2018-11-09 MED ORDER — SIMVASTATIN 10 MG PO TABS: 10.0000 mg | ORAL_TABLET | Freq: Every day | ORAL | 1 refills | Status: DC

## 2018-11-09 NOTE — Progress Notes (Signed)
Chronic Care Management   Visit Note  11/02/2018 Name: Teresa Golden MRN: [id number] DOB: 03-01-1945  Referred by: Glendale Chard, MD Reason for referral : Chronic Care Management   Teresa Golden is a 73 y.o. year old female who is a primary care patient of Glendale Chard, MD. The CCM team was consulted for assistance with chronic disease management and care coordination needs.   Review of patient status, including review of consultants reports, relevant laboratory and other test results, and collaboration with appropriate care team members and the patient's provider was performed as part of comprehensive patient evaluation and provision of chronic care management services.    I spoke with Teresa Golden by telephone today.  Advanced Directives Status: N See Care Plan and [place]yn[employer] application for related entries.   Medications: Outpatient Encounter Medications as of 11/02/2018  Medication Sig  . B Complex-C (B-COMPLEX WITH VITAMIN C) tablet Take 1 tablet by mouth daily.  . carvedilol (COREG CR) 20 MG 24 hr capsule TAKE 1 CAPSULE DAILY  . Cholecalciferol 5000 units TABS Take 5,000 Units by mouth daily.  . Colchicine 0.6 MG CAPS TAKE 1 CAPSULE DAILY AS    NEEDED  . colchicine 0.6 MG tablet Take 1 tablet (0.6 mg total) by mouth daily.  . Cyanocobalamin (VITAMIN B-12) 2500 MCG SUBL Take 2,500 mcg by mouth daily.  . diclofenac sodium (VOLTAREN) 1 % GEL APPLY 2 GRAMS TO AFFECTED AREA(S) 4 TIMES DAILY AS NEEDED  . docusate sodium (COLACE) 100 MG capsule Take 1 capsule (100 mg total) by mouth 2 (two) times daily. (Patient not taking: Reported on 07/05/2018)  . gabapentin (NEURONTIN) 300 MG capsule Take 300 mg by mouth at bedtime.   . insulin degludec (TRESIBA FLEXTOUCH) 100 UNIT/ML SOPN FlexTouch Pen Inject 0.09 mLs (9 Units total) into the skin daily at 10 pm. (Patient taking differently: Inject 10 Units into the skin daily at 10 pm. )  . levothyroxine (SYNTHROID, LEVOTHROID) 25 MCG tablet Take 12.5-25  mcg by mouth See admin instructions. Take 0.5 tablet (12.5 mcg) by mouth on Mondays through Fridays, then take 1 tablet (25 mcg) by mouth on Saturdays and Sundays.  [name] losartan (COZAAR) 50 MG tablet Take 1 tablet (50 mg total) by mouth 2 (two) times daily.  . Multiple Vitamins-Minerals (HAIR/SKIN/NAILS/BIOTIN PO) Take 1 tablet by mouth daily.  . Multiple Vitamins-Minerals (MULTIVITAMIN WITH MINERALS) tablet Take 1 tablet by mouth daily. Women's 50+  . naproxen sodium (ANAPROX) 220 MG tablet Take 440 mg by mouth 2 (two) times daily as needed (pain).   [name] OZEMPIC, 1 MG/DOSE, 2 MG/1.5ML SOPN INJECT INTO SKIN ONCE A WEEK  . simvastatin (ZOCOR) 10 MG tablet Take 1 tablet (10 mg total) by mouth daily. (Patient not taking: Reported on 11/02/2018)   No facility-administered encounter medications on file as of 11/02/2018.      Objective:   Goals Addressed            This Visit's Progress     Patient Stated   . I want to continue to optimize medication management of my chronic conditons (pt-stated)       Current Barriers:  . Non Adherence to prescribed medication regimen . Financial Barriers  Pharmacist Clinical Goal(s):  [name] Over the next 90 days, patient will work with CCM team & PCP to address needs related to optimized medication management of chronic conditions.  Interventions: . Comprehensive medication review performed. . Advised patient to continue eating an ADA recommended diet.  Reviewed food  options for snacks, etc.   . Patient reports FBGs has been increasing--> mostly in the 130-140s.  She states her BG never exceed 140.  She denies hypoglycemia.  She has now increased her dose of Tresiba back to 14 units at bedtime.  She reports her Bgs are improving. . She is motivated to continue her progress.  She is determined to not gain weight, however is unable to exercise as much as she did pre-COVID.  She enjoys swimming at the [facility], but they have a limited schedule at this time. . Diabetes  regimen optimized at this time with Tresiba 14 units daily and weekly Ozempic. [name] Patient reports taking statin therapy MWF and reports compliance, however patient has not filled this year per fill data report.  [facility] called [facility] mail order for MWF statin #39 for 90 days.  Also, encouraged patient to continue taking aspiring 81mg  as prescribed by MD . Will continue to follow   Patient Self Care Activities:  . Self administers medications as prescribed . Attends all scheduled provider appointments . Calls pharmacy for medication refills  Please see past updates related to this goal by clicking on the "Past Updates" button in the selected goal          Plan:   The care management team will reach out to the patient again over the next 4-6 weeks.  Regina Eck, PharmD, BCPS Clinical Pharmacist, [facility] [place]: [phone]

## 2018-11-09 NOTE — Patient Instructions (Signed)
Visit Information  Goals Addressed            This Visit's Progress     Patient Stated   . I want to continue to optimize medication management of my chronic conditons (pt-stated)       Current Barriers:  . Non Adherence to prescribed medication regimen . Financial Barriers  Pharmacist Clinical Goal(s):  [name] Over the next 90 days, patient will work with CCM team & PCP to address needs related to optimized medication management of chronic conditions.  Interventions: . Comprehensive medication review performed. . Advised patient to continue eating an ADA recommended diet.  Reviewed food options for snacks, etc.   . Patient reports FBGs has been increasing--> mostly in the 130-140s.  She states her BG never exceed 140.  She denies hypoglycemia.  She has now increased her dose of Tresiba back to 14 units at bedtime.  She reports her Bgs are improving. . She is motivated to continue her progress.  She is determined to not gain weight, however is unable to exercise as much as she did pre-COVID.  She enjoys swimming at the [facility], but they have a limited schedule at this time. . Diabetes regimen optimized at this time with Tresiba 14 units daily and weekly Ozempic. [name] Patient reports taking statin therapy MWF and reports compliance, however patient has not filled this year per fill data report.  [facility] MWF statin #39 for 90 days.  Also, encouraged patient to continue taking aspiring 81mg  as prescribed by MD . Will continue to follow   Patient Self Care Activities:  . Self administers medications as prescribed . Attends all scheduled provider appointments . Calls pharmacy for medication refills  Please see past updates related to this goal by clicking on the "Past Updates" button in the selected goal         The patient verbalized understanding of instructions provided today and declined a print copy of patient instruction materials.   The care management  team will reach out to the patient again over the next 4-6 weeks.  Regina Eck, PharmD, BCPS Clinical Pharmacist, [facility] [place]: [phone]

## 2018-11-10 ENCOUNTER — Ambulatory Visit (INDEPENDENT_AMBULATORY_CARE_PROVIDER_SITE_OTHER): Payer: Medicare HMO | Admitting: Pharmacist

## 2018-11-10 DIAGNOSIS — I129 Hypertensive chronic kidney disease with stage 1 through stage 4 chronic kidney disease, or unspecified chronic kidney disease: Secondary | ICD-10-CM | POA: Diagnosis not present

## 2018-11-10 DIAGNOSIS — N183 Chronic kidney disease, stage 3 unspecified: Secondary | ICD-10-CM

## 2018-11-10 DIAGNOSIS — R69 Illness, unspecified: Secondary | ICD-10-CM | POA: Diagnosis not present

## 2018-11-10 DIAGNOSIS — E1122 Type 2 diabetes mellitus with diabetic chronic kidney disease: Secondary | ICD-10-CM | POA: Diagnosis not present

## 2018-11-11 ENCOUNTER — Ambulatory Visit (INDEPENDENT_AMBULATORY_CARE_PROVIDER_SITE_OTHER): Payer: Medicare HMO | Admitting: Psychiatry

## 2018-11-11 DIAGNOSIS — F4323 Adjustment disorder with mixed anxiety and depressed mood: Secondary | ICD-10-CM | POA: Diagnosis not present

## 2018-11-18 ENCOUNTER — Ambulatory Visit (INDEPENDENT_AMBULATORY_CARE_PROVIDER_SITE_OTHER): Payer: Medicare HMO | Admitting: Psychiatry

## 2018-11-18 DIAGNOSIS — F4323 Adjustment disorder with mixed anxiety and depressed mood: Secondary | ICD-10-CM | POA: Diagnosis not present

## 2018-11-18 DIAGNOSIS — R69 Illness, unspecified: Secondary | ICD-10-CM | POA: Diagnosis not present

## 2018-11-22 ENCOUNTER — Other Ambulatory Visit: Payer: Self-pay | Admitting: Internal Medicine

## 2018-11-22 NOTE — Patient Instructions (Signed)
Visit Information  Goals Addressed            This Visit's Progress     Patient Stated   . I want to continue to optimize medication management of my chronic conditons (pt-stated)       Current Barriers:  . Non Adherence to prescribed medication regimen . Financial Barriers  Pharmacist Clinical Goal(s):  [name] Over the next 90 days, patient will work with CCM team & PCP to address needs related to optimized medication management of chronic conditions.  Interventions: . Comprehensive medication review performed. . Advised patient to continue eating an ADA recommended diet.  Reviewed food options for snacks, etc.   . Patient reports FBGs has been increasing--> mostly in the 140-160s.  Previously, her FBG did not exceed 140.  She denies hypoglycemia.  She has now increased her dose of Tresiba back to 14 units at bedtime (was previously taking 7-10units).  She reports she has had to increase her insulin due to "higher" blood sugars.  She explains that her diet and activity is not as it was pre-COVID.  She continues to display self awareness regarding her diabetes.  New refill request sent in to [facility] for new dose through patient assistance program.   . She is motivated to continue her progress.  She is determined to not gain weight, however is unable to exercise as much as she did pre-COVID.  She enjoys swimming at the [facility], but they have a limited schedule at this time.  Encouraged patient to increase outdoor activity due to improved weather/temperatures.  . Diabetes regimen optimized at this time with Tresiba 14 units daily and weekly Ozempic 0.5mg . . Antihypertensive regimen: carvedilol, losartan . Antihyperlipidemic regimen:  Patient reports taking statin therapy MWF and reports compliance, however patient has not filled this year per fill data report.  Refill requested from CMA [facility] MWF statin #39 for 90 days.  EMR generated simvastatin 10mg  daily #90  RX.  Patient has already filled this medication.  Will correct fill for next time.  Also, encouraged patient to continue taking aspirin 81mg  as prescribed by MD . Will continue to follow   Patient Self Care Activities:  . Self administers medications as prescribed . Attends all scheduled provider appointments . Calls pharmacy for medication refills  Please see past updates related to this goal by clicking on the "Past Updates" button in the selected goal         The patient verbalized understanding of instructions provided today and declined a print copy of patient instruction materials.   The care management team will reach out to the patient again over the next 6 weeks.  Regina Eck, PharmD, BCPS Clinical Pharmacist, [facility] [place]: [phone]

## 2018-11-22 NOTE — Progress Notes (Signed)
Chronic Care Management   Visit Note  11/10/2018 Name: ANALENA TAPANI MRN: [id number] DOB: 1945-06-18  Referred by: Glendale Chard, MD Reason for referral : Chronic Care Management   Teresa Golden is a 73 y.o. year old female who is a primary care patient of Glendale Chard, MD. The CCM team was consulted for assistance with chronic disease management and care coordination needs.   Review of patient status, including review of consultants reports, relevant laboratory and other test results, and collaboration with appropriate care team members and the patient's provider was performed as part of comprehensive patient evaluation and provision of chronic care management services.    I spoke with Ms. Gravely by telephone today.  Advanced Directives Status: N See Care Plan and Vynca application for related entries.   Medications: Outpatient Encounter Medications as of 11/10/2018  Medication Sig  . B Complex-C (B-COMPLEX WITH VITAMIN C) tablet Take 1 tablet by mouth daily.  . carvedilol (COREG CR) 20 MG 24 hr capsule TAKE 1 CAPSULE DAILY  . Cholecalciferol 5000 units TABS Take 5,000 Units by mouth daily.  . Colchicine 0.6 MG CAPS TAKE 1 CAPSULE DAILY AS    NEEDED  . colchicine 0.6 MG tablet Take 1 tablet (0.6 mg total) by mouth daily.  . Cyanocobalamin (VITAMIN B-12) 2500 MCG SUBL Take 2,500 mcg by mouth daily.  . diclofenac sodium (VOLTAREN) 1 % GEL APPLY 2 GRAMS TO AFFECTED AREA(S) 4 TIMES DAILY AS NEEDED  . docusate sodium (COLACE) 100 MG capsule Take 1 capsule (100 mg total) by mouth 2 (two) times daily. (Patient not taking: Reported on 07/05/2018)  . gabapentin (NEURONTIN) 300 MG capsule Take 300 mg by mouth at bedtime.   . insulin degludec (TRESIBA FLEXTOUCH) 100 UNIT/ML SOPN FlexTouch Pen Inject 0.09 mLs (9 Units total) into the skin daily at 10 pm. (Patient taking differently: Inject 14 Units into the skin daily at 10 pm. )  . levothyroxine (SYNTHROID, LEVOTHROID) 25 MCG tablet Take 12.5-25  mcg by mouth See admin instructions. Take 0.5 tablet (12.5 mcg) by mouth on Mondays through Fridays, then take 1 tablet (25 mcg) by mouth on Saturdays and Sundays.  [name] losartan (COZAAR) 50 MG tablet Take 1 tablet (50 mg total) by mouth 2 (two) times daily.  . Multiple Vitamins-Minerals (HAIR/SKIN/NAILS/BIOTIN PO) Take 1 tablet by mouth daily.  . Multiple Vitamins-Minerals (MULTIVITAMIN WITH MINERALS) tablet Take 1 tablet by mouth daily. Women's 50+  . naproxen sodium (ANAPROX) 220 MG tablet Take 440 mg by mouth 2 (two) times daily as needed (pain).   [name] OZEMPIC, 1 MG/DOSE, 2 MG/1.5ML SOPN INJECT INTO SKIN ONCE A WEEK  . simvastatin (ZOCOR) 10 MG tablet Take 1 tablet (10 mg total) by mouth daily.   No facility-administered encounter medications on file as of 11/10/2018.      Objective:   Goals Addressed            This Visit's Progress     Patient Stated   . I want to continue to optimize medication management of my chronic conditons (pt-stated)       Current Barriers:  . Non Adherence to prescribed medication regimen . Financial Barriers  Pharmacist Clinical Goal(s):  [name] Over the next 90 days, patient will work with CCM team & PCP to address needs related to optimized medication management of chronic conditions.  Interventions: . Comprehensive medication review performed. . Advised patient to continue eating an ADA recommended diet.  Reviewed food options for snacks, etc.   .  Patient reports FBGs has been increasing--> mostly in the 140-160s.  Previously, her FBG did not exceed 140.  She denies hypoglycemia.  She has now increased her dose of Tresiba back to 14 units at bedtime (was previously taking 7-10units).  She reports she has had to increase her insulin due to "higher" blood sugars.  She explains that her diet and activity is not as it was pre-COVID.  She continues to display self awareness regarding her diabetes.  New refill request sent in to [facility] for new dose through  patient assistance program.   . She is motivated to continue her progress.  She is determined to not gain weight, however is unable to exercise as much as she did pre-COVID.  She enjoys swimming at the [facility], but they have a limited schedule at this time.  Encouraged patient to increase outdoor activity due to improved weather/temperatures.  . Diabetes regimen optimized at this time with Tresiba 14 units daily and weekly Ozempic 0.5mg . . Antihypertensive regimen: carvedilol, losartan . Antihyperlipidemic regimen:  Patient reports taking statin therapy MWF and reports compliance, however patient has not filled this year per fill data report.  Refill requested from CMA [facility] MWF statin #39 for 90 days.  EMR generated simvastatin 10mg  daily #90 RX.  Patient has already filled this medication.  Will correct fill for next time.  Also, encouraged patient to continue taking aspirin 81mg  as prescribed by MD . Will continue to follow   Patient Self Care Activities:  . Self administers medications as prescribed . Attends all scheduled provider appointments . Calls pharmacy for medication refills  Please see past updates related to this goal by clicking on the "Past Updates" button in the selected goal          Plan:   The care management team will reach out to the patient again over the next 6 weeks.   Regina Eck, PharmD, BCPS Clinical Pharmacist, [facility] [place]: [phone]

## 2018-11-23 ENCOUNTER — Ambulatory Visit: Payer: Self-pay | Admitting: Pharmacist

## 2018-11-23 DIAGNOSIS — I129 Hypertensive chronic kidney disease with stage 1 through stage 4 chronic kidney disease, or unspecified chronic kidney disease: Secondary | ICD-10-CM

## 2018-11-23 DIAGNOSIS — N183 Chronic kidney disease, stage 3 unspecified: Secondary | ICD-10-CM

## 2018-11-23 DIAGNOSIS — E1122 Type 2 diabetes mellitus with diabetic chronic kidney disease: Secondary | ICD-10-CM | POA: Diagnosis not present

## 2018-11-23 NOTE — Patient Instructions (Signed)
Visit Information  Goals Addressed            This Visit's Progress     Patient Stated   . COMPLETED: "I am having trouble affording my Ozempic and Tresiba" (pt-stated)       . Current Barriers:  . Financial constraints  . Currently in a coverage gap . Awaiting outcome of patient assistance application  Clinical Social Work Clinical Goal(s):  [name] Over the next 20 days, patient will work with CCM team to address needs related to medication costs  CCM PharmD Interventions: Incoming call from patient completed on 11/23/18 Call placed to [employer] on 11/23/18 regarding patient assistance program . Comprehensive medication review performed and list updated in the EMR . Requested call back from [employer] regarding application for [place].  Of note, patient is already approved for Ozempic until 12/2018.   . Call placed to [employer] on 11/23/18 to follow up on application status.  New dose request was missing "Flex Pen".  Re-faxed new request from [place] FlexPen 100u/mL (14 units at bedtime) and Ozempic.     Patient Self Care Activities:  . Self administers medications as prescribed . Calls pharmacy for medication refills . Calls provider office for new concerns or questions  Please see past updates related to this goal by clicking on the "Past Updates" button in the selected goal      . I want to continue to optimize medication management of my chronic conditons (pt-stated)       Current Barriers:  . Non Adherence to prescribed medication regimen . Financial Barriers  Pharmacist Clinical Goal(s):  [name] Over the next 90 days, patient will work with CCM team & PCP to address needs related to optimized medication management of chronic conditions.  Interventions: . Comprehensive medication review performed. . Advised patient to continue eating an ADA recommended diet.  Reviewed food options for snacks, etc.   . Patient reports FBGs has been increasing--> mostly in the 140-160s.   Previously, her FBG did not exceed 140.  She denies hypoglycemia.  She has now increased her dose of Tresiba back to 14 units at bedtime (was previously taking 7-10units).  She reports she has had to increase her insulin due to "higher" blood sugars.  She explains that her diet and activity is not as it was pre-COVID.  She continues to display self awareness regarding her diabetes.  New refill request sent in to [facility] for new dose through patient assistance program.   . She is motivated to continue her progress.  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  She is also working again from home.  She has previously been unable to exercise as much as she did pre-COVID.  Encouraged patient to increase outdoor activity due to improved weather/temperatures.  . Diabetes regimen optimized at this time with Tresiba 14 units daily and weekly Ozempic 0.5mg . . Antihypertensive regimen: carvedilol, losartan . Antihyperlipidemic regimen:  Patient reports taking statin therapy MWF and reports compliance, however patient has not filled this year per fill data report.  Refill requested from CMA [facility] MWF statin #39 for 90 days.  EMR generated simvastatin 10mg  daily #90 RX.  Patient has already filled this medication.  Will correct fill for next time.  Also, encouraged patient to continue taking aspirin 81mg  as prescribed by MD . Will continue to follow   Patient Self Care Activities:  . Self administers medications as  prescribed . Attends all scheduled provider appointments . Calls pharmacy for medication refills  Please see past updates related to this goal by clicking on the "Past Updates" button in the selected goal         The patient verbalized understanding of instructions provided today and declined a print copy of patient instruction materials.   The care management team will reach out to the patient again over the next 4-6 weeks  Regina Eck, PharmD, [facility] Pharmacist, [place]: [phone]

## 2018-11-23 NOTE — Progress Notes (Signed)
Chronic Care Management   Visit Note  11/23/2018 Name: Teresa Golden MRN: [id number] DOB: 1945-06-01  Referred by: Glendale Chard, MD Reason for referral : Chronic Care Management   Teresa Golden is a 73 y.o. year old female who is a primary care patient of Glendale Chard, MD. The CCM team was consulted for assistance with chronic disease management and care coordination needs.   Review of patient status, including review of consultants reports, relevant laboratory and other test results, and collaboration with appropriate care team members and the patient's provider was performed as part of comprehensive patient evaluation and provision of chronic care management services.    I spoke with Teresa Golden by telephone today.  Advanced Directives Status: N See Care Plan and Vynca application for related entries.   Medications: Outpatient Encounter Medications as of 11/23/2018  Medication Sig  . B Complex-C (B-COMPLEX WITH VITAMIN C) tablet Take 1 tablet by mouth daily.  . carvedilol (COREG CR) 20 MG 24 hr capsule TAKE 1 CAPSULE DAILY  . Cholecalciferol 5000 units TABS Take 5,000 Units by mouth daily.  . Colchicine 0.6 MG CAPS TAKE 1 CAPSULE DAILY AS    NEEDED  . Cyanocobalamin (VITAMIN B-12) 2500 MCG SUBL Take 2,500 mcg by mouth daily.  . diclofenac sodium (VOLTAREN) 1 % GEL APPLY 2 GRAMS TO AFFECTED AREA(S) 4 TIMES DAILY AS NEEDED  . docusate sodium (COLACE) 100 MG capsule Take 1 capsule (100 mg total) by mouth 2 (two) times daily. (Patient not taking: Reported on 07/05/2018)  . gabapentin (NEURONTIN) 300 MG capsule Take 300 mg by mouth at bedtime.   . insulin degludec (TRESIBA FLEXTOUCH) 100 UNIT/ML SOPN FlexTouch Pen Inject 0.09 mLs (9 Units total) into the skin daily at 10 pm. (Patient taking differently: Inject 14 Units into the skin daily at 10 pm. )  . levothyroxine (SYNTHROID, LEVOTHROID) 25 MCG tablet Take 12.5-25 mcg by mouth See admin instructions. Take 0.5 tablet (12.5 mcg) by mouth  on Mondays through Fridays, then take 1 tablet (25 mcg) by mouth on Saturdays and Sundays.  [name] losartan (COZAAR) 50 MG tablet Take 1 tablet (50 mg total) by mouth 2 (two) times daily.  . Multiple Vitamins-Minerals (HAIR/SKIN/NAILS/BIOTIN PO) Take 1 tablet by mouth daily.  . Multiple Vitamins-Minerals (MULTIVITAMIN WITH MINERALS) tablet Take 1 tablet by mouth daily. Women's 50+  . naproxen sodium (ANAPROX) 220 MG tablet Take 440 mg by mouth 2 (two) times daily as needed (pain).   [name] OZEMPIC, 1 MG/DOSE, 2 MG/1.5ML SOPN INJECT INTO SKIN ONCE A WEEK  . simvastatin (ZOCOR) 10 MG tablet Take 1 tablet (10 mg total) by mouth daily.   No facility-administered encounter medications on file as of 11/23/2018.      Objective:   Goals Addressed            This Visit's Progress     Patient Stated   . COMPLETED: "I am having trouble affording my Ozempic and Tresiba" (pt-stated)       . Current Barriers:  . Financial constraints  . Currently in a coverage gap . Awaiting outcome of patient assistance application  Clinical Social Work Clinical Goal(s):  [name] Over the next 20 days, patient will work with CCM team to address needs related to medication costs  CCM PharmD Interventions: Incoming call from patient completed on 11/23/18 Call placed to [employer] on 11/23/18 regarding patient assistance program . Comprehensive medication review performed and list updated in the EMR . Requested call back from [employer] regarding application for [place].  Of note, patient is already approved for Ozempic until 12/2018.   . Call placed to [employer] on 11/23/18 to follow up on application status.  New dose request was missing "Flex Pen".  Re-faxed new request from [place] FlexPen 100u/mL (14 units at bedtime) and Ozempic.     Patient Self Care Activities:  . Self administers medications as prescribed . Calls pharmacy for medication refills . Calls provider office for new concerns or questions  Please see  past updates related to this goal by clicking on the "Past Updates" button in the selected goal      . I want to continue to optimize medication management of my chronic conditons (pt-stated)       Current Barriers:  . Non Adherence to prescribed medication regimen . Financial Barriers  Pharmacist Clinical Goal(s):  [name] Over the next 90 days, patient will work with CCM team & PCP to address needs related to optimized medication management of chronic conditions.  Interventions: . Comprehensive medication review performed. . Advised patient to continue eating an ADA recommended diet.  Reviewed food options for snacks, etc.   . Patient reports FBGs has been increasing--> mostly in the 140-160s.  Previously, her FBG did not exceed 140.  She denies hypoglycemia.  She has now increased her dose of Tresiba back to 14 units at bedtime (was previously taking 7-10 units).  She reports she has had to increase her insulin due to "higher" blood sugars.  She explains that her diet and activity is not as it was pre-COVID.  She continues to display self awareness regarding her diabetes.  New refill request sent in to [employer] for new dose through patient assistance program.   . She is motivated to continue her progress.  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  She is also working again from home.  She has previously been unable to exercise as much as she did pre-COVID.  Encouraged patient to increase outdoor activity due to improved weather/temperatures.  . Diabetes regimen optimized at this time with Tresiba 14 units daily and weekly Ozempic 0.5mg .  Last A1c was 6.4% on 08/31/18. [name] Antihypertensive regimen: carvedilol, losartan . Antihyperlipidemic regimen:  Patient reports taking statin therapy MWF and reports compliance, however patient has not filled this year per fill data report.  Refill requested from CMA [facility] MWF statin #39 for 90 days.  EMR  generated simvastatin 10mg  daily #90 RX.  Patient has already filled this medication.  Will correct fill for next time.  Also, encouraged patient to continue taking aspirin 81mg  as prescribed by MD . Will continue to follow   Patient Self Care Activities:  . Self administers medications as prescribed . Attends all scheduled provider appointments . Calls pharmacy for medication refills  Please see past updates related to this goal by clicking on the "Past Updates" button in the selected goal         Plan:   The care management team will reach out to the patient again over the next 4-6 weeks.  Regina Eck, PharmD, BCPS Clinical Pharmacist, [facility] [place]: [phone]

## 2018-11-25 ENCOUNTER — Ambulatory Visit (INDEPENDENT_AMBULATORY_CARE_PROVIDER_SITE_OTHER): Payer: Medicare HMO | Admitting: Psychiatry

## 2018-11-25 DIAGNOSIS — F4323 Adjustment disorder with mixed anxiety and depressed mood: Secondary | ICD-10-CM | POA: Diagnosis not present

## 2018-11-25 DIAGNOSIS — R69 Illness, unspecified: Secondary | ICD-10-CM | POA: Diagnosis not present

## 2018-11-26 ENCOUNTER — Other Ambulatory Visit: Payer: Self-pay | Admitting: Pharmacy Technician

## 2018-11-26 NOTE — Patient Outreach (Signed)
[facility]) Care Management  11/26/2018  Teresa Golden 11/15/1945 [id number]    Follow up call placed to [employer] regarding patient assistance shipping details for [name] and Ozempic, Ronny Bacon confirms refill orders had been received and processed. Medication to arrive at providers office in 10-14 business days. [name] Order # [id number] and Ozempic Order # [id number]  Follow up:  Will route note to [facility] [place] and Sydnee Cabal B @ [facility] to inform.  Maud Deed Chana Bode [place] Certified Pharmacy Technician [place] Management Direct [phone]

## 2018-12-07 ENCOUNTER — Telehealth: Payer: Self-pay

## 2018-12-09 ENCOUNTER — Ambulatory Visit (INDEPENDENT_AMBULATORY_CARE_PROVIDER_SITE_OTHER): Payer: Medicare HMO | Admitting: Psychiatry

## 2018-12-09 DIAGNOSIS — R69 Illness, unspecified: Secondary | ICD-10-CM | POA: Diagnosis not present

## 2018-12-09 DIAGNOSIS — F4323 Adjustment disorder with mixed anxiety and depressed mood: Secondary | ICD-10-CM | POA: Diagnosis not present

## 2018-12-13 ENCOUNTER — Ambulatory Visit (INDEPENDENT_AMBULATORY_CARE_PROVIDER_SITE_OTHER): Payer: Medicare HMO | Admitting: Pharmacist

## 2018-12-13 DIAGNOSIS — E6609 Other obesity due to excess calories: Secondary | ICD-10-CM

## 2018-12-13 DIAGNOSIS — N183 Chronic kidney disease, stage 3 unspecified: Secondary | ICD-10-CM

## 2018-12-13 DIAGNOSIS — Z6836 Body mass index (BMI) 36.0-36.9, adult: Secondary | ICD-10-CM

## 2018-12-13 DIAGNOSIS — E1122 Type 2 diabetes mellitus with diabetic chronic kidney disease: Secondary | ICD-10-CM | POA: Diagnosis not present

## 2018-12-14 NOTE — Progress Notes (Signed)
Chronic Care Management   Visit Note  12/13/2018 Name: Teresa Golden MRN: [id number] DOB: 25-Jul-1945  Referred by: Glendale Chard, MD Reason for referral : Chronic Care Management   Teresa Golden is a 73 y.o. year old female who is a primary care patient of Glendale Chard, MD. The CCM team was consulted for assistance with chronic disease management and care coordination needs related to HLD and DMII  Review of patient status, including review of consultants reports, relevant laboratory and other test results, and collaboration with appropriate care team members and the patient's provider was performed as part of comprehensive patient evaluation and provision of chronic care management services.    I spoke with Teresa Golden by telephone today.  Advanced Directives Status: N See Care Plan and Vynca application for related entries.   Medications: Outpatient Encounter Medications as of 12/13/2018  Medication Sig  . B Complex-C (B-COMPLEX WITH VITAMIN C) tablet Take 1 tablet by mouth daily.  . carvedilol (COREG CR) 20 MG 24 hr capsule TAKE 1 CAPSULE DAILY  . Cholecalciferol 5000 units TABS Take 5,000 Units by mouth daily.  . Colchicine 0.6 MG CAPS TAKE 1 CAPSULE DAILY AS    NEEDED  . Cyanocobalamin (VITAMIN B-12) 2500 MCG SUBL Take 2,500 mcg by mouth daily.  . diclofenac sodium (VOLTAREN) 1 % GEL APPLY 2 GRAMS TO AFFECTED AREA(S) 4 TIMES DAILY AS NEEDED  . docusate sodium (COLACE) 100 MG capsule Take 1 capsule (100 mg total) by mouth 2 (two) times daily. (Patient not taking: Reported on 07/05/2018)  . gabapentin (NEURONTIN) 300 MG capsule Take 300 mg by mouth at bedtime.   . insulin degludec (TRESIBA FLEXTOUCH) 100 UNIT/ML SOPN FlexTouch Pen Inject 0.09 mLs (9 Units total) into the skin daily at 10 pm. (Patient taking differently: Inject 14 Units into the skin daily at 10 pm. )  . levothyroxine (SYNTHROID, LEVOTHROID) 25 MCG tablet Take 12.5-25 mcg by mouth See admin instructions. Take 0.5  tablet (12.5 mcg) by mouth on Mondays through Fridays, then take 1 tablet (25 mcg) by mouth on Saturdays and Sundays.  [name] losartan (COZAAR) 50 MG tablet Take 1 tablet (50 mg total) by mouth 2 (two) times daily.  . Multiple Vitamins-Minerals (HAIR/SKIN/NAILS/BIOTIN PO) Take 1 tablet by mouth daily.  . Multiple Vitamins-Minerals (MULTIVITAMIN WITH MINERALS) tablet Take 1 tablet by mouth daily. Women's 50+  . naproxen sodium (ANAPROX) 220 MG tablet Take 440 mg by mouth 2 (two) times daily as needed (pain).   [name] OZEMPIC, 1 MG/DOSE, 2 MG/1.5ML SOPN INJECT INTO SKIN ONCE A WEEK  . simvastatin (ZOCOR) 10 MG tablet Take 1 tablet (10 mg total) by mouth daily.   No facility-administered encounter medications on file as of 12/13/2018.      Objective:   Goals Addressed            This Visit's Progress     Patient Stated   . I want to continue to optimize medication management of my chronic conditons (pt-stated)       Current Barriers:  . Non Adherence to prescribed medication regimen . Financial Barriers  Pharmacist Clinical Goal(s):  [name] Over the next 90 days, patient will work with CCM team & PCP to address needs related to optimized medication management of chronic conditions.  Interventions: . Comprehensive medication review performed. . Advised patient to continue eating an ADA recommended diet.  Reviewed food options for snacks, etc.   . Patient reports FBGs has not exceeded 140 and continues to  improve due to increased physical activity.  She denies hypoglycemia.  She has now increased her dose of Tresiba back to 14 units at bedtime (was previously taking 7-10 units).  She reports she has had to increase her insulin due to "higher" blood sugars.  She explains that her diet and activity is not as it was pre-COVID.  She continues to display self awareness regarding her diabetes.  New refill request sent in to [employer] for new dose through patient assistance program.  [employer] delivered  medications to office on Friday, however office is closed on Friday.  Called to request expedited delivery this week. . She is motivated to continue her progress.  She is actively participating in [employer] classes.  She is also working again from home.  .  . Diabetes regimen optimized at this time with Tresiba 14 units daily and weekly Ozempic 0.5mg .  Last A1c was 6.4% on 08/31/18. [name] Antihypertensive regimen: carvedilol, losartan . Antihyperlipidemic regimen:  Patient reports taking statin therapy MWF and reports compliance, however patient has not filled this year per fill data report.  Refill requested from CMA [facility] MWF statin #39 for 90 days.  EMR generated simvastatin 10mg  daily #90 RX.  Patient has already filled this medication.  Will correct fill for next time.  Also, encouraged patient to continue taking aspirin 81mg  as prescribed by MD . Will continue to follow   Patient Self Care Activities:  . Self administers medications as prescribed . Attends all scheduled provider appointments . Calls pharmacy for medication refills  Please see past updates related to this goal by clicking on the "Past Updates" button in the selected goal         Plan:   The care management team will reach out to the patient again over the next 6-8 weeks.  Regina Eck, PharmD, BCPS Clinical Pharmacist, [facility] [place]: [phone]

## 2018-12-14 NOTE — Patient Instructions (Signed)
Visit Information  Goals Addressed            This Visit's Progress     Patient Stated   . I want to continue to optimize medication management of my chronic conditons (pt-stated)       Current Barriers:  . Non Adherence to prescribed medication regimen . Financial Barriers  Pharmacist Clinical Goal(s):  [name] Over the next 90 days, patient will work with CCM team & PCP to address needs related to optimized medication management of chronic conditions.  Interventions: . Comprehensive medication review performed. . Advised patient to continue eating an ADA recommended diet.  Reviewed food options for snacks, etc.   . Patient reports FBGs has not exceeded 140 and continues to improve due to increased physical activity.  She denies hypoglycemia.  She has now increased her dose of Tresiba back to 14 units at bedtime (was previously taking 7-10 units).  She reports she has had to increase her insulin due to "higher" blood sugars.  She explains that her diet and activity is not as it was pre-COVID.  She continues to display self awareness regarding her diabetes.  New refill request sent in to [employer] for new dose through patient assistance program.  [employer] delivered medications to office on Friday, however office is closed on Friday.  Called to request expedited delivery this week. . She is motivated to continue her progress.  She is actively participating in [employer] classes.  She is also working again from home.  .  . Diabetes regimen optimized at this time with Tresiba 14 units daily and weekly Ozempic 0.5mg .  Last A1c was 6.4% on 08/31/18. [name] Antihypertensive regimen: carvedilol, losartan . Antihyperlipidemic regimen:  Patient reports taking statin therapy MWF and reports compliance, however patient has not filled this year per fill data report.  Refill requested from CMA [facility] MWF statin #39 for 90 days.  EMR generated simvastatin 10mg  daily #90  RX.  Patient has already filled this medication.  Will correct fill for next time.  Also, encouraged patient to continue taking aspirin 81mg  as prescribed by MD . Will continue to follow   Patient Self Care Activities:  . Self administers medications as prescribed . Attends all scheduled provider appointments . Calls pharmacy for medication refills  Please see past updates related to this goal by clicking on the "Past Updates" button in the selected goal         The patient verbalized understanding of instructions provided today and declined a print copy of patient instruction materials.   The care management team will reach out to the patient again over the next 6-8 weeks.  Regina Eck, PharmD, BCPS Clinical Pharmacist, [facility] [place]: [phone]

## 2018-12-15 ENCOUNTER — Telehealth: Payer: Self-pay

## 2018-12-15 NOTE — Telephone Encounter (Signed)
Pt was informed that her medications from the pap is here in the office. Pt stated that she would come to pick them up tmrw

## 2018-12-20 ENCOUNTER — Telehealth: Payer: Self-pay

## 2018-12-20 NOTE — Telephone Encounter (Signed)
I called patient to see if she is taking colchicine everyday? I left pt v/m to call the office. YRL,RMA

## 2018-12-21 ENCOUNTER — Telehealth: Payer: Self-pay

## 2018-12-21 NOTE — Telephone Encounter (Signed)
Patient stated she only takes it as needed for gout flares. YRL,RMA    I called patient to see if she is taking colchicine everyday? I left pt v/m to call the office. YRL,RMA

## 2018-12-23 ENCOUNTER — Ambulatory Visit (INDEPENDENT_AMBULATORY_CARE_PROVIDER_SITE_OTHER): Payer: Medicare HMO | Admitting: Psychiatry

## 2018-12-23 DIAGNOSIS — R69 Illness, unspecified: Secondary | ICD-10-CM | POA: Diagnosis not present

## 2018-12-23 DIAGNOSIS — F4323 Adjustment disorder with mixed anxiety and depressed mood: Secondary | ICD-10-CM | POA: Diagnosis not present

## 2018-12-28 DIAGNOSIS — E119 Type 2 diabetes mellitus without complications: Secondary | ICD-10-CM | POA: Diagnosis not present

## 2018-12-30 ENCOUNTER — Ambulatory Visit (INDEPENDENT_AMBULATORY_CARE_PROVIDER_SITE_OTHER): Payer: Medicare HMO | Admitting: Psychiatry

## 2018-12-30 DIAGNOSIS — F4323 Adjustment disorder with mixed anxiety and depressed mood: Secondary | ICD-10-CM | POA: Diagnosis not present

## 2018-12-30 DIAGNOSIS — R69 Illness, unspecified: Secondary | ICD-10-CM | POA: Diagnosis not present

## 2019-01-03 ENCOUNTER — Telehealth: Payer: Self-pay

## 2019-01-04 ENCOUNTER — Telehealth: Payer: Self-pay

## 2019-01-04 NOTE — Telephone Encounter (Signed)
I do not understand what the letter is for. Why does she need my approval to return to school?

## 2019-01-04 NOTE — Telephone Encounter (Signed)
PT WOULD LIKE TO KNOW STATUS OF LTR THAT WAS REQ LAST WEEK FOR HER TO RETURN BACK TO SCHOOL ADV PT WILL HAVE SOMEONE CALL ONCE SPEAK W/PROVIDER

## 2019-01-06 ENCOUNTER — Ambulatory Visit: Payer: Medicare HMO | Admitting: Psychiatry

## 2019-01-06 NOTE — Telephone Encounter (Signed)
You can write letter that she is medically stable to attend online school.

## 2019-01-06 NOTE — Telephone Encounter (Signed)
PT STATED THAT ITS NOT HER THAT NEEDS THE LTR IT IS THE SCHOOL SHE IS ATTENDING, SHE STATED THAT YEARS AGO THERE WAS A LTR YOU PROVIDED HER THAT HAD HER SCHOOL LOANS DISCHARGED DUE TO DISABILITY, NOW THE SCHOOL IS NEEDING SOMETHING TO STATE THAT SHE CAN ATTEND SCHOOL ONLINE WITH NO PHYSICAL INCAPABILITIES

## 2019-01-12 ENCOUNTER — Encounter: Payer: Self-pay | Admitting: Internal Medicine

## 2019-01-12 NOTE — Telephone Encounter (Signed)
SPOKE W/PT WHICH IS SATISFIED W/LTR THAT WAS COMPLETED FOR HER TO RTN TO SCHOOL. SHE WILL PICK UP AT HER APPT ON 11/23.

## 2019-01-13 ENCOUNTER — Ambulatory Visit (INDEPENDENT_AMBULATORY_CARE_PROVIDER_SITE_OTHER): Payer: Medicare HMO | Admitting: Psychiatry

## 2019-01-13 DIAGNOSIS — F4323 Adjustment disorder with mixed anxiety and depressed mood: Secondary | ICD-10-CM

## 2019-01-13 DIAGNOSIS — R69 Illness, unspecified: Secondary | ICD-10-CM | POA: Diagnosis not present

## 2019-01-17 ENCOUNTER — Encounter: Payer: Self-pay | Admitting: Internal Medicine

## 2019-01-17 ENCOUNTER — Ambulatory Visit (INDEPENDENT_AMBULATORY_CARE_PROVIDER_SITE_OTHER): Payer: Medicare HMO | Admitting: Internal Medicine

## 2019-01-17 ENCOUNTER — Telehealth: Payer: Self-pay

## 2019-01-17 ENCOUNTER — Other Ambulatory Visit: Payer: Self-pay

## 2019-01-17 VITALS — BP 124/80 | HR 72 | Temp 98.5°F | Ht 65.4 in | Wt 221.8 lb

## 2019-01-17 DIAGNOSIS — Z0000 Encounter for general adult medical examination without abnormal findings: Secondary | ICD-10-CM

## 2019-01-17 DIAGNOSIS — Z6835 Body mass index (BMI) 35.0-35.9, adult: Secondary | ICD-10-CM | POA: Diagnosis not present

## 2019-01-17 DIAGNOSIS — E1122 Type 2 diabetes mellitus with diabetic chronic kidney disease: Secondary | ICD-10-CM | POA: Diagnosis not present

## 2019-01-17 DIAGNOSIS — N183 Chronic kidney disease, stage 3 unspecified: Secondary | ICD-10-CM

## 2019-01-17 DIAGNOSIS — I129 Hypertensive chronic kidney disease with stage 1 through stage 4 chronic kidney disease, or unspecified chronic kidney disease: Secondary | ICD-10-CM

## 2019-01-17 DIAGNOSIS — C220 Liver cell carcinoma: Secondary | ICD-10-CM

## 2019-01-17 DIAGNOSIS — Z79899 Other long term (current) drug therapy: Secondary | ICD-10-CM | POA: Diagnosis not present

## 2019-01-17 DIAGNOSIS — Z9884 Bariatric surgery status: Secondary | ICD-10-CM

## 2019-01-17 DIAGNOSIS — E66812 Obesity, class 2: Secondary | ICD-10-CM

## 2019-01-17 LAB — POCT UA - MICROALBUMIN
Albumin/Creatinine Ratio, Urine, POC: 30
Creatinine, POC: 100 mg/dL
Microalbumin Ur, POC: 10 mg/L

## 2019-01-17 LAB — POCT URINALYSIS DIPSTICK
Bilirubin, UA: NEGATIVE
Blood, UA: NEGATIVE
Glucose, UA: NEGATIVE
Ketones, UA: NEGATIVE
Nitrite, UA: NEGATIVE
Protein, UA: NEGATIVE
Spec Grav, UA: 1.02 (ref 1.010–1.025)
Urobilinogen, UA: 0.2 E.U./dL
pH, UA: 6.5 (ref 5.0–8.0)

## 2019-01-17 NOTE — Patient Instructions (Signed)
Health Maintenance, Female Adopting a healthy lifestyle and getting preventive care are important in promoting health and wellness. Ask your health care provider about:  The right schedule for you to have regular tests and exams.  Things you can do on your own to prevent diseases and keep yourself healthy. What should I know about diet, weight, and exercise? Eat a healthy diet   Eat a diet that includes plenty of vegetables, fruits, low-fat dairy products, and lean protein.  Do not eat a lot of foods that are high in solid fats, added sugars, or sodium. Maintain a healthy weight Body mass index (BMI) is used to identify weight problems. It estimates body fat based on height and weight. Your health care provider can help determine your BMI and help you achieve or maintain a healthy weight. Get regular exercise Get regular exercise. This is one of the most important things you can do for your health. Most adults should:  Exercise for at least 150 minutes each week. The exercise should increase your heart rate and make you sweat (moderate-intensity exercise).  Do strengthening exercises at least twice a week. This is in addition to the moderate-intensity exercise.  Spend less time sitting. Even light physical activity can be beneficial. Watch cholesterol and blood lipids Have your blood tested for lipids and cholesterol at 74 years of age, then have this test every 5 years. Have your cholesterol levels checked more often if:  Your lipid or cholesterol levels are high.  You are older than 74 years of age.  You are at high risk for heart disease. What should I know about cancer screening? Depending on your health history and family history, you may need to have cancer screening at various ages. This may include screening for:  Breast cancer.  Cervical cancer.  Colorectal cancer.  Skin cancer.  Lung cancer. What should I know about heart disease, diabetes, and high blood  pressure? Blood pressure and heart disease  High blood pressure causes heart disease and increases the risk of stroke. This is more likely to develop in people who have high blood pressure readings, are of African descent, or are overweight.  Have your blood pressure checked: ? Every 3-5 years if you are 18-39 years of age. ? Every year if you are 40 years old or older. Diabetes Have regular diabetes screenings. This checks your fasting blood sugar level. Have the screening done:  Once every three years after age 40 if you are at a normal weight and have a low risk for diabetes.  More often and at a younger age if you are overweight or have a high risk for diabetes. What should I know about preventing infection? Hepatitis B If you have a higher risk for hepatitis B, you should be screened for this virus. Talk with your health care provider to find out if you are at risk for hepatitis B infection. Hepatitis C Testing is recommended for:  Everyone born from 1945 through 1965.  Anyone with known risk factors for hepatitis C. Sexually transmitted infections (STIs)  Get screened for STIs, including gonorrhea and chlamydia, if: ? You are sexually active and are younger than 74 years of age. ? You are older than 74 years of age and your health care provider tells you that you are at risk for this type of infection. ? Your sexual activity has changed since you were last screened, and you are at increased risk for chlamydia or gonorrhea. Ask your health care provider if   you are at risk.  Ask your health care provider about whether you are at high risk for HIV. Your health care provider may recommend a prescription medicine to help prevent HIV infection. If you choose to take medicine to prevent HIV, you should first get tested for HIV. You should then be tested every 3 months for as long as you are taking the medicine. Pregnancy  If you are about to stop having your period (premenopausal) and  you may become pregnant, seek counseling before you get pregnant.  Take 400 to 800 micrograms (mcg) of folic acid every day if you become pregnant.  Ask for birth control (contraception) if you want to prevent pregnancy. Osteoporosis and menopause Osteoporosis is a disease in which the bones lose minerals and strength with aging. This can result in bone fractures. If you are 65 years old or older, or if you are at risk for osteoporosis and fractures, ask your health care provider if you should:  Be screened for bone loss.  Take a calcium or vitamin D supplement to lower your risk of fractures.  Be given hormone replacement therapy (HRT) to treat symptoms of menopause. Follow these instructions at home: Lifestyle  Do not use any products that contain nicotine or tobacco, such as cigarettes, e-cigarettes, and chewing tobacco. If you need help quitting, ask your health care provider.  Do not use street drugs.  Do not share needles.  Ask your health care provider for help if you need support or information about quitting drugs. Alcohol use  Do not drink alcohol if: ? Your health care provider tells you not to drink. ? You are pregnant, may be pregnant, or are planning to become pregnant.  If you drink alcohol: ? Limit how much you use to 0-1 drink a day. ? Limit intake if you are breastfeeding.  Be aware of how much alcohol is in your drink. In the [place] one drink equals one 12 oz bottle of beer (355 mL), one 5 oz glass of wine (148 mL), or one 1 oz glass of hard liquor (44 mL). General instructions  Schedule regular health, dental, and eye exams.  Stay current with your vaccines.  Tell your health care provider if: ? You often feel depressed. ? You have ever been abused or do not feel safe at home. Summary  Adopting a healthy lifestyle and getting preventive care are important in promoting health and wellness.  Follow your health care provider's instructions about healthy  diet, exercising, and getting tested or screened for diseases.  Follow your health care provider's instructions on monitoring your cholesterol and blood pressure. This information is not intended to replace advice given to you by your health care provider. Make sure you discuss any questions you have with your health care provider. Document Released: 08/26/2010 Document Revised: 02/03/2018 Document Reviewed: 02/03/2018 Elsevier Patient Education  2020 [employer]  

## 2019-01-17 NOTE — Progress Notes (Signed)
Subjective:     [name] , female    DOB: November 23, 1945 , 73 y.o.   MRN: [id number]   Chief Complaint  Patient presents with  . Annual Exam  . Diabetes  . Hypertension    HPI  She is here today for a full physical examination.  She is no longer followed by GYN. She reports that she does not care for her. She does not wish to have a GYN referral at this time.   Diabetes She presents for her follow-up diabetic visit. She has type 2 diabetes mellitus. There are no hypoglycemic associated symptoms. Pertinent negatives for diabetes include no blurred vision and no chest pain. There are no hypoglycemic complications. Risk factors for coronary artery disease include diabetes mellitus, dyslipidemia, hypertension, obesity, sedentary lifestyle and post-menopausal. She is following a diabetic diet. She participates in exercise intermittently. Her home blood glucose trend is fluctuating minimally. Her breakfast blood glucose is taken between 8-9 am. Her breakfast blood glucose range is generally 110-130 mg/dl. An ACE inhibitor/angiotensin II receptor blocker is being taken. Eye exam is current.  Hypertension This is a chronic problem. The current episode started more than 1 year ago. The problem has been gradually improving since onset. The problem is uncontrolled. Pertinent negatives include no blurred vision or chest pain. Risk factors for coronary artery disease include diabetes mellitus, dyslipidemia, post-menopausal state and sedentary lifestyle. Past treatments include angiotensin blockers and beta blockers. The current treatment provides moderate improvement.     Past Medical History:  Diagnosis Date  . Arthritis   . Chronic kidney disease    self reports ckd stage 3   . Diabetes mellitus, type II, insulin dependent ([place])    With neurologic complications. Bilateral lower extremity peripheral neuropathy  . Dyspnea    with excertion; no issues now since weight loss surgery   .  Endometriosis   . Essential hypertension   . GERD (gastroesophageal reflux disease)   . 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 Hypertensive nephropathy 07/05/2018  . Hypothyroidism   . Hypothyroidism 02/06/2018  . Morbid obesity with BMI of 50.0-59.9, adult ([place])   . Scoliosis      Family History  Problem Relation Age of Onset  . Heart attack Mother   . Heart failure Mother   . Stroke Father      Current Outpatient Medications:  .  B Complex-C (B-COMPLEX WITH VITAMIN C) tablet, Take 1 tablet by mouth daily., Disp: , Rfl:  .  carvedilol (COREG CR) 20 MG 24 hr capsule, TAKE 1 CAPSULE DAILY, Disp: 90 capsule, Rfl: 2 .  Cholecalciferol 5000 units TABS, Take 5,000 Units by mouth daily., Disp: , Rfl:  .  Colchicine 0.6 MG CAPS, TAKE 1 CAPSULE DAILY AS    NEEDED, Disp: 90 capsule, Rfl: 1 .  Cyanocobalamin (VITAMIN B-12) 2500 MCG SUBL, Take 2,500 mcg by mouth daily., Disp: , Rfl:  .  diclofenac sodium (VOLTAREN) 1 % GEL, APPLY 2 GRAMS TO AFFECTED AREA(S) 4 TIMES DAILY AS NEEDED, Disp: 200 g, Rfl: 2 .  docusate sodium (COLACE) 100 MG capsule, Take 1 capsule (100 mg total) by mouth 2 (two) times daily. (Patient not taking: Reported on 07/05/2018), Disp: 60 capsule, Rfl: 1 .  gabapentin (NEURONTIN) 300 MG capsule, Take 300 mg by mouth at bedtime. , Disp: , Rfl:  .  insulin degludec (TRESIBA FLEXTOUCH) 100 UNIT/ML SOPN FlexTouch Pen, Inject 0.09 mLs (  9 Units total) into the skin daily at 10 pm. (Patient taking differently: Inject 14 Units into the skin daily at 10 pm. ), Disp: 5 pen, Rfl: 2 .  levothyroxine (SYNTHROID, LEVOTHROID) 25 MCG tablet, Take 12.5-25 mcg by mouth See admin instructions. Take 0.5 tablet (12.5 mcg) by mouth on Mondays through Fridays, then take 1 tablet (25 mcg) by mouth on Saturdays and Sundays., Disp: , Rfl:  .  losartan (COZAAR) 50 MG tablet, Take 1 tablet (50 mg total) by mouth 2 (two) times daily., Disp: 180 tablet, Rfl: 1 .   Multiple Vitamins-Minerals (HAIR/SKIN/NAILS/BIOTIN PO), Take 1 tablet by mouth daily., Disp: , Rfl:  .  Multiple Vitamins-Minerals (MULTIVITAMIN WITH MINERALS) tablet, Take 1 tablet by mouth daily. Women's 50+, Disp: , Rfl:  .  naproxen sodium (ANAPROX) 220 MG tablet, Take 440 mg by mouth 2 (two) times daily as needed (pain). , Disp: , Rfl:  .  OZEMPIC, 1 MG/DOSE, 2 MG/1.5ML SOPN, INJECT INTO SKIN ONCE A WEEK, Disp: 9 pen, Rfl: 1 .  simvastatin (ZOCOR) 10 MG tablet, Take 1 tablet (10 mg total) by mouth daily., Disp: 90 tablet, Rfl: 1   Allergies  Allergen Reactions  . Meloxicam Other (See Comments)    Fever; muscle aches; "flu-like" symptoms  . Other     Rose fever and hay fever      The patient states she uses post menopausal status for birth control. Last LMP was No LMP recorded. Patient has had a hysterectomy.. Negative for Dysmenorrhea '@MAMMOFINDINGS' @. Negative for: breast discharge, breast lump(s), breast pain and breast self exam. Associated symptoms include abnormal vaginal bleeding. Pertinent negatives include abnormal bleeding (hematology), anxiety, decreased libido, depression, difficulty falling sleep, dyspareunia, history of infertility, nocturia, sexual dysfunction, sleep disturbances, urinary incontinence, urinary urgency, vaginal discharge and vaginal itching. Diet regular.The patient states her exercise level is   intermittent.   . The patient's tobacco use is:  Social History   Tobacco Use  Smoking Status Former Smoker  . Packs/day: 0.25  . Years: 10.00  . Pack years: 2.50  . Types: Cigarettes  Smokeless Tobacco Never Used  Tobacco Comment   quit 20 years ago  . She has been exposed to passive smoke. The patient's alcohol use is:  Social History   Substance and Sexual Activity  Alcohol Use Yes  . Alcohol/week: 1.0 standard drinks  . Types: 1 Glasses of wine per week   Comment: occasional    Review of Systems  Constitutional: Negative.   HENT: Negative.   Eyes:  Negative.  Negative for blurred vision.  Respiratory: Negative.   Cardiovascular: Negative.  Negative for chest pain.  Endocrine: Negative.   Genitourinary: Negative.   Musculoskeletal: Negative.   Skin: Negative.   Allergic/Immunologic: Negative.   Neurological: Negative.   Hematological: Negative.   Psychiatric/Behavioral: Negative.      Today's Vitals   01/17/19 1046  BP: 124/80  Pulse: 72  Temp: 98.5 F (36.9 C)  TempSrc: Oral  Weight: 221 lb 12.8 oz (100.6 kg)  Height: 5' 5.4" (1.661 m)  PainSc: 2   PainLoc: Knee   Body mass index is 36.46 kg/m.   Objective:  Physical Exam Vitals signs and nursing note reviewed.  Constitutional:      Appearance: Normal appearance. She is obese.  HENT:     Head: Normocephalic and atraumatic.     Right Ear: Tympanic membrane, ear canal and external ear normal.     Left Ear: Tympanic membrane, ear canal and external  ear normal.     Nose:     Comments: Deferred, masked    Mouth/Throat:     Comments: Deferred, masked Eyes:     Extraocular Movements: Extraocular movements intact.     Conjunctiva/sclera: Conjunctivae normal.     Pupils: Pupils are equal, round, and reactive to light.  Neck:     Musculoskeletal: Normal range of motion and neck supple.  Cardiovascular:     Rate and Rhythm: Normal rate and regular rhythm.     Pulses: Normal pulses.          Dorsalis pedis pulses are 2+ on the right side and 2+ on the left side.     Heart sounds: Normal heart sounds.  Pulmonary:     Effort: Pulmonary effort is normal.     Breath sounds: Normal breath sounds.  Chest:     Breasts: Tanner Score is 5.        Right: Normal.        Left: Normal.  Abdominal:     General: Bowel sounds are normal.     Palpations: Abdomen is soft.     Comments: Obese.  Genitourinary:    Comments: deferred Musculoskeletal: Normal range of motion.  Feet:     Right foot:     Protective Sensation: 5 sites tested. 3 sites sensed.     Skin integrity:  Callus and dry skin present.     Toenail Condition: Right toenails are normal.     Left foot:     Protective Sensation: 5 sites tested. 3 sites sensed.     Skin integrity: Callus and dry skin present.     Toenail Condition: Left toenails are normal.  Skin:    General: Skin is warm and dry.  Neurological:     General: No focal deficit present.     Mental Status: She is alert and oriented to person, place, and time.  Psychiatric:        Mood and Affect: Mood normal.        Behavior: Behavior normal.         Assessment And Plan:     1. Routine general medical examination at health care facility  A full exam was performed. Importance of monthly self breast exams was discussed with the patient. PATIENT HAS BEEN ADVISED TO GET 30-45 MINUTES REGULAR EXERCISE NO LESS THAN FOUR TO FIVE DAYS PER WEEK - BOTH WEIGHTBEARING EXERCISES AND AEROBIC ARE RECOMMENDED.  HE/SHE WAS ADVISED TO FOLLOW A HEALTHY DIET WITH AT LEAST SIX FRUITS/VEGGIES PER DAY, DECREASE INTAKE OF RED MEAT, AND TO INCREASE FISH INTAKE TO TWO DAYS PER WEEK.  MEATS/FISH SHOULD NOT BE FRIED, BAKED OR BROILED IS PREFERABLE.  I SUGGEST WEARING SPF 50 SUNSCREEN ON EXPOSED PARTS AND ESPECIALLY WHEN IN THE DIRECT SUNLIGHT FOR AN EXTENDED PERIOD OF TIME.  PLEASE AVOID FAST FOOD RESTAURANTS AND INCREASE YOUR WATER INTAKE.  2. Diabetes mellitus with stage 3 chronic kidney disease ([place])  Diabetic foot exam was performed.  I DISCUSSED WITH THE PATIENT AT LENGTH REGARDING THE GOALS OF GLYCEMIC CONTROL AND POSSIBLE LONG-TERM COMPLICATIONS.  I  ALSO STRESSED THE IMPORTANCE OF COMPLIANCE WITH HOME GLUCOSE MONITORING, DIETARY RESTRICTIONS INCLUDING AVOIDANCE OF SUGARY DRINKS/PROCESSED FOODS,  ALONG WITH REGULAR EXERCISE.  I  ALSO STRESSED THE IMPORTANCE OF ANNUAL EYE EXAMS, SELF FOOT CARE AND COMPLIANCE WITH OFFICE VISITS.  - EKG 12-Lead - CMP14+EGFR - CBC - Lipid panel - Hemoglobin A1c  3. Stage 3 chronic kidney disease, unspecified whether  stage 3a or 3b CKD  Chronic. Importance of optimal bp/bs control and adequate hydration was discussed with the patient.   4. Hypertensive nephropathy  Chronic, well controlled. She will continue with current meds. EKG performed, no new changes noted. She is encouraged to avoid adding salt to her foods.   5. Class 2 severe obesity due to excess calories with serious comorbidity and body mass index (BMI) of 35.0 to 35.9 in adult [facility])  She has lost 51 pounds since June 2018. She is s/p gastric sleeve surgery in July 2018.  Importance of achieving optimal weight to decrease risk of cardiovascular disease and cancers was discussed with the patient in full detail. Importance of regular exercise was discussed with the patient.  She is encouraged to start slowly - start with 10 minutes twice daily at least three to four days per week and to gradually build to 30 minutes five days weekly. She was given tips to incorporate more activity into her daily routine - take stairs when possible, park farther away from grocery stores, etc.   6. Drug therapy  - Vitamin B12   7. Hepatoma ([facility])  Chronic. Also followed by Hepatology.    Maximino Greenland, MD    THE PATIENT IS ENCOURAGED TO PRACTICE SOCIAL DISTANCING DUE TO THE COVID-19 PANDEMIC.

## 2019-01-18 ENCOUNTER — Ambulatory Visit (INDEPENDENT_AMBULATORY_CARE_PROVIDER_SITE_OTHER): Payer: Medicare HMO | Admitting: Pharmacist

## 2019-01-18 DIAGNOSIS — E1122 Type 2 diabetes mellitus with diabetic chronic kidney disease: Secondary | ICD-10-CM | POA: Diagnosis not present

## 2019-01-18 DIAGNOSIS — N183 Chronic kidney disease, stage 3 unspecified: Secondary | ICD-10-CM

## 2019-01-18 LAB — CMP14+EGFR
ALT: 18 IU/L (ref 0–32)
AST: 26 IU/L (ref 0–40)
Albumin/Globulin Ratio: 1.6 (ref 1.2–2.2)
Albumin: 4.1 g/dL (ref 3.7–4.7)
Alkaline Phosphatase: 77 IU/L (ref 39–117)
BUN/Creatinine Ratio: 15 (ref 12–28)
BUN: 14 mg/dL (ref 8–27)
Bilirubin Total: 0.5 mg/dL (ref 0.0–1.2)
CO2: 25 mmol/L (ref 20–29)
Calcium: 9.2 mg/dL (ref 8.7–10.3)
Chloride: 102 mmol/L (ref 96–106)
Creatinine, Ser: 0.92 mg/dL (ref 0.57–1.00)
GFR calc Af Amer: 71 mL/min/{1.73_m2} (ref >59)
GFR calc non Af Amer: 62 mL/min/{1.73_m2} (ref >59)
Globulin, Total: 2.6 g/dL (ref 1.5–4.5)
Glucose: 86 mg/dL (ref 65–99)
Potassium: 3.9 mmol/L (ref 3.5–5.2)
Sodium: 141 mmol/L (ref 134–144)
Total Protein: 6.7 g/dL (ref 6.0–8.5)

## 2019-01-18 LAB — LIPID PANEL
Chol/HDL Ratio: 2.5 ratio (ref 0.0–4.4)
Cholesterol, Total: 156 mg/dL (ref 100–199)
HDL: 62 mg/dL (ref >39)
LDL Chol Calc (NIH): 82 mg/dL (ref 0–99)
Triglycerides: 61 mg/dL (ref 0–149)
VLDL Cholesterol Cal: 12 mg/dL (ref 5–40)

## 2019-01-18 LAB — HEMOGLOBIN A1C
Est. average glucose Bld gHb Est-mCnc: 137 mg/dL
Hgb A1c MFr Bld: 6.4 % — ABNORMAL HIGH (ref 4.8–5.6)

## 2019-01-18 LAB — CBC
Hematocrit: 34.5 % (ref 34.0–46.6)
Hemoglobin: 11.8 g/dL (ref 11.1–15.9)
MCH: 28.9 pg (ref 26.6–33.0)
MCHC: 34.2 g/dL (ref 31.5–35.7)
MCV: 85 fL (ref 79–97)
Platelets: 198 10*3/uL (ref 150–450)
RBC: 4.08 x10E6/uL (ref 3.77–5.28)
RDW: 13.4 % (ref 11.7–15.4)
WBC: 7.1 10*3/uL (ref 3.4–10.8)

## 2019-01-18 LAB — VITAMIN B12: Vitamin B-12: >2000 pg/mL — ABNORMAL HIGH (ref 232–1245)

## 2019-01-19 ENCOUNTER — Encounter: Payer: Self-pay | Admitting: Internal Medicine

## 2019-01-21 DIAGNOSIS — C220 Liver cell carcinoma: Secondary | ICD-10-CM | POA: Insufficient documentation

## 2019-01-24 ENCOUNTER — Other Ambulatory Visit: Payer: Self-pay | Admitting: Internal Medicine

## 2019-01-24 DIAGNOSIS — Z1231 Encounter for screening mammogram for malignant neoplasm of breast: Secondary | ICD-10-CM

## 2019-01-24 NOTE — Patient Instructions (Signed)
Visit Information  Goals Addressed            This Visit's Progress     Patient Stated   . I want to continue to optimize medication management of my chronic conditons (pt-stated)       Current Barriers:  . Non Adherence to prescribed medication regimen . Financial Barriers  Pharmacist Clinical Goal(s):  [name] Over the next 90 days, patient will work with CCM team & PCP to address needs related to optimized medication management of chronic conditions.  Interventions: . Comprehensive medication review performed. . Advised patient to continue eating an ADA recommended diet.  Reviewed food options for snacks, etc.   . Patient reports FBGs has not exceeded 140 and continues to improve due to increased physical activity.  She denies hypoglycemia.  She has now increased her dose of Tresiba back to 14 units at bedtime (was previously taking 7-10 units).  She reports she has had to increase her insulin due to "higher" blood sugars.  She explains that her diet and activity is not as it was pre-COVID.  She continues to display self awareness regarding her diabetes.  New refill request sent in to [employer] for new dose through patient assistance program.  [employer] delivered medications to office on Friday, however office is closed on Friday.  Called to request expedited delivery this week. . She is motivated to continue her progress.  She is actively participating in [employer] classes.  She is also working again from home.  .  . Diabetes regimen optimized at this time with Tresiba 14 units daily and weekly Ozempic 0.5mg .  Last A1c was 6.4% on 08/31/18. [name] Antihypertensive regimen: carvedilol, losartan . Antihyperlipidemic regimen:  Patient reports taking statin therapy MWF and reports compliance, however patient has not filled this year per fill data report.  Refill requested from CMA to be [facility] for MWF statin #39 for 90 days.  EMR generated simvastatin 10mg  daily #90  RX.  Patient filled on 01/09/19--she states she is trying to take daily (filled for #90 days).  Will correct fill for next time.  Also, encouraged patient to continue taking aspirin 81mg  as prescribed by MD . Will continue to follow   Patient Self Care Activities:  . Self administers medications as prescribed . Attends all scheduled provider appointments . Calls pharmacy for medication refills  Please see past updates related to this goal by clicking on the "Past Updates" button in the selected goal         The patient verbalized understanding of instructions provided today and declined a print copy of patient instruction materials.   The care management team will reach out to the patient again over the next 30 days.   SIGNATURE Regina Eck, PharmD, BCPS Clinical Pharmacist, [facility] [place]: [phone]

## 2019-01-24 NOTE — Progress Notes (Signed)
Chronic Care Management    Visit Note  01/18/2019 Name: Teresa Golden MRN: [id number] DOB: Dec 29, 1945  Referred by: Glendale Chard, MD Reason for referral : Chronic Care Management   Teresa Golden is a 73 y.o. year old female who is a primary care patient of Glendale Chard, MD. The CCM team was consulted for assistance with chronic disease management and care coordination needs related to HLD and DMII  Review of patient status, including review of consultants reports, relevant laboratory and other test results, and collaboration with appropriate care team members and the patient's provider was performed as part of comprehensive patient evaluation and provision of chronic care management services.    Medications: Outpatient Encounter Medications as of 01/18/2019  Medication Sig  . B Complex-C (B-COMPLEX WITH VITAMIN C) tablet Take 1 tablet by mouth daily.  . carvedilol (COREG CR) 20 MG 24 hr capsule TAKE 1 CAPSULE DAILY  . Cholecalciferol 5000 units TABS Take 5,000 Units by mouth daily.  . Colchicine 0.6 MG CAPS TAKE 1 CAPSULE DAILY AS    NEEDED  . Cyanocobalamin (VITAMIN B-12) 2500 MCG SUBL Take 2,500 mcg by mouth daily.  . diclofenac sodium (VOLTAREN) 1 % GEL APPLY 2 GRAMS TO AFFECTED AREA(S) 4 TIMES DAILY AS NEEDED  . docusate sodium (COLACE) 100 MG capsule Take 1 capsule (100 mg total) by mouth 2 (two) times daily. (Patient not taking: Reported on 07/05/2018)  . gabapentin (NEURONTIN) 300 MG capsule Take 300 mg by mouth at bedtime.   . insulin degludec (TRESIBA FLEXTOUCH) 100 UNIT/ML SOPN FlexTouch Pen Inject 0.09 mLs (9 Units total) into the skin daily at 10 pm. (Patient taking differently: Inject 14 Units into the skin daily at 10 pm. )  . levothyroxine (SYNTHROID, LEVOTHROID) 25 MCG tablet Take 12.5-25 mcg by mouth See admin instructions. Take 0.5 tablet (12.5 mcg) by mouth on Mondays through Fridays, then take 1 tablet (25 mcg) by mouth on Saturdays and Sundays.  [name] losartan (COZAAR)  50 MG tablet Take 1 tablet (50 mg total) by mouth 2 (two) times daily.  . Multiple Vitamins-Minerals (HAIR/SKIN/NAILS/BIOTIN PO) Take 1 tablet by mouth daily.  . Multiple Vitamins-Minerals (MULTIVITAMIN WITH MINERALS) tablet Take 1 tablet by mouth daily. Women's 50+  . naproxen sodium (ANAPROX) 220 MG tablet Take 440 mg by mouth 2 (two) times daily as needed (pain).   [name] OZEMPIC, 1 MG/DOSE, 2 MG/1.5ML SOPN INJECT INTO SKIN ONCE A WEEK  . simvastatin (ZOCOR) 10 MG tablet Take 1 tablet (10 mg total) by mouth daily.   No facility-administered encounter medications on file as of 01/18/2019.      Objective:   Goals Addressed            This Visit's Progress     Patient Stated   . I want to continue to optimize medication management of my chronic conditons (pt-stated)       Current Barriers:  . Non Adherence to prescribed medication regimen . Financial Barriers  Pharmacist Clinical Goal(s):  [name] Over the next 90 days, patient will work with CCM team & PCP to address needs related to optimized medication management of chronic conditions.  Interventions: . Comprehensive medication review performed. . Advised patient to continue eating an ADA recommended diet.  Reviewed food options for snacks, etc.   . Patient reports FBGs has not exceeded 140 and continues to improve due to increased physical activity.  She denies hypoglycemia.  She has now increased her dose of Tresiba back to 14 units  at bedtime (was previously taking 7-10 units).  She reports she has had to increase her insulin due to "higher" blood sugars.  She explains that her diet and activity is not as it was pre-COVID.  She continues to display self awareness regarding her diabetes.  New refill request sent in to [employer] for new dose through patient assistance program.  [employer] delivered medications to office on Friday, however office is closed on Friday.  Called to request expedited delivery this week. . She is motivated to  continue her progress.  She is actively participating in [employer] classes.  She is also working again from home.  .  . Diabetes regimen optimized at this time with Tresiba 14 units daily and weekly Ozempic 0.5mg .  Last A1c was 6.4% on 08/31/18. [name] Antihypertensive regimen: carvedilol, losartan . Antihyperlipidemic regimen:  Patient reports taking statin therapy MWF and reports compliance, however patient has not filled this year per fill data report.  Refill requested from CMA [facility] MWF statin #39 for 90 days.  EMR generated simvastatin 10mg  daily #90 RX.  Patient filled on 01/09/19--she states she is trying to take daily (filled for #90 days).  Will correct fill for next time.  Also, encouraged patient to continue taking aspirin 81mg  as prescribed by MD . Will continue to follow   Patient Self Care Activities:  . Self administers medications as prescribed . Attends all scheduled provider appointments . Calls pharmacy for medication refills  Please see past updates related to this goal by clicking on the "Past Updates" button in the selected goal          Plan:   The care management team will reach out to the patient again over the next 30 days.     Provider Signature  Regina Eck, PharmD, BCPS Clinical Pharmacist, [facility] [place]: [phone]

## 2019-01-26 ENCOUNTER — Other Ambulatory Visit: Payer: Self-pay | Admitting: Internal Medicine

## 2019-01-27 ENCOUNTER — Other Ambulatory Visit: Payer: Self-pay

## 2019-01-27 ENCOUNTER — Ambulatory Visit (INDEPENDENT_AMBULATORY_CARE_PROVIDER_SITE_OTHER): Payer: Medicare HMO | Admitting: Psychiatry

## 2019-01-27 ENCOUNTER — Telehealth: Payer: Self-pay

## 2019-01-27 DIAGNOSIS — F4323 Adjustment disorder with mixed anxiety and depressed mood: Secondary | ICD-10-CM | POA: Diagnosis not present

## 2019-01-27 DIAGNOSIS — R69 Illness, unspecified: Secondary | ICD-10-CM | POA: Diagnosis not present

## 2019-01-27 MED ORDER — SYNTHROID 25 MCG PO TABS: ORAL_TABLET | ORAL | 1 refills | Status: DC

## 2019-01-27 NOTE — Telephone Encounter (Signed)
Called pt to inform her that her prescription has been sent to the pharmacy

## 2019-01-31 ENCOUNTER — Other Ambulatory Visit: Payer: Self-pay | Admitting: Internal Medicine

## 2019-01-31 MED ORDER — LEVOTHYROXINE SODIUM 25 MCG PO TABS: 12.5000 ug | ORAL_TABLET | ORAL | 3 refills | Status: DC

## 2019-02-03 DIAGNOSIS — L602 Onychogryphosis: Secondary | ICD-10-CM | POA: Diagnosis not present

## 2019-02-03 DIAGNOSIS — E1351 Other specified diabetes mellitus with diabetic peripheral angiopathy without gangrene: Secondary | ICD-10-CM | POA: Diagnosis not present

## 2019-02-08 ENCOUNTER — Ambulatory Visit: Payer: Medicare HMO

## 2019-02-08 ENCOUNTER — Other Ambulatory Visit: Payer: Self-pay

## 2019-02-08 ENCOUNTER — Ambulatory Visit (INDEPENDENT_AMBULATORY_CARE_PROVIDER_SITE_OTHER): Payer: Medicare HMO

## 2019-02-08 VITALS — BP 140/90 | HR 70 | Temp 97.6°F | Ht 65.0 in | Wt 226.8 lb

## 2019-02-08 DIAGNOSIS — Z0000 Encounter for general adult medical examination without abnormal findings: Secondary | ICD-10-CM | POA: Diagnosis not present

## 2019-02-08 NOTE — Progress Notes (Signed)
This visit occurred during the SARS-CoV-2 public health emergency.  Safety protocols were in place, including screening questions prior to the visit, additional usage of staff PPE, and extensive cleaning of exam room while observing appropriate contact time as indicated for disinfecting solutions.  Subjective:   Teresa Golden is a 73 y.o. female who presents for Medicare Annual (Subsequent) preventive examination.  Review of Systems:  n/a Cardiac Risk Factors include: advanced age (>74men, >17 women);diabetes mellitus;hypertension;obesity (BMI >30kg/m2);sedentary lifestyle     Objective:     Vitals: BP 140/90 (BP Location: Left Arm, Patient Position: Sitting, Cuff Size: Normal)   Pulse 70   Temp 97.6 F (36.4 C) (Oral)   Ht 5\' 5"  (1.651 m)   Wt 226 lb 12.8 oz (102.9 kg)   SpO2 99%   BMI 37.74 kg/m   Body mass index is 37.74 kg/m.  Advanced Directives 02/08/2019 07/15/2018 02/04/2018 07/30/2017 07/16/2017 08/27/2016 08/26/2016  Does Patient Have a Medical Advance Directive? Yes Yes Yes Yes Yes - Yes  Type of Advance Directive [place];Living will [place];Living will Healthcare Power of Attorney Living will Living will [place];Living will [place];Living will  Does patient want to make changes to medical advance directive? - No - Patient declined No - Patient declined No - Patient declined No - Patient declined No - Patient declined -  Copy of [place] in Chart? No - copy requested No - copy requested No - copy requested - - No - copy requested No - copy requested  Would patient like information on creating a medical advance directive? - - - - - - -    Tobacco Social History   Tobacco Use  Smoking Status Former Smoker  . Packs/day: 0.25  . Years: 10.00  . Pack years: 2.50  . Types: Cigarettes  Smokeless Tobacco Never Used  Tobacco Comment   quit 20 years ago     Counseling given:  Not Answered Comment: quit 20 years ago   Clinical Intake:  Pre-visit preparation completed: Yes  Pain : 0-10 Pain Score: 2  Pain Type: Chronic pain Pain Location: Neck Pain Orientation: Left Pain Radiating Towards: down to elbow Pain Descriptors / Indicators: Shooting, Aching Pain Onset: More than a month ago Pain Frequency: Intermittent Pain Relieving Factors: aleve and voltaren gel  Pain Relieving Factors: aleve and voltaren gel  Nutritional Status: BMI > 30  Obese Nutritional Risks: None Diabetes: Yes CBG done?: No Did pt. bring in CBG monitor from home?: No  How often do you need to have someone help you when you read instructions, pamphlets, or other written materials from your doctor or pharmacy?: 1 - Never What is the last grade level you completed in school?: some college  Interpreter Needed?: No  Information entered by :: NAllen LPN  Past Medical History:  Diagnosis Date  . Arthritis   . Chronic kidney disease    self reports ckd stage 3   . Diabetes mellitus, type II, insulin dependent ([place])    With neurologic complications. Bilateral lower extremity peripheral neuropathy  . Dyspnea    with excertion; no issues now since weight loss surgery   . Endometriosis   . Essential hypertension   . GERD (gastroesophageal reflux disease)   . 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 Hypertensive nephropathy 07/05/2018  . Hypothyroidism   . Hypothyroidism 02/06/2018  . Morbid  obesity with BMI of 50.0-59.9, adult ([place])   . Scoliosis    Past Surgical History:  Procedure Laterality Date  . ABDOMINAL HYSTERECTOMY     uterus  . CHOLECYSTECTOMY    . EYE SURGERY     cataract extraction bilateral   . JOINT REPLACEMENT     Left knee  . KNEE ARTHROPLASTY Right 07/30/2017   Procedure: RIGHT TOTAL KNEE ARTHROPLASTY WITH COMPUTER NAVIGATION;  Surgeon: Rod Can, MD;  Location: [facility];  Service: Orthopedics;  Laterality: Right;   Needs RNFA  . LAPAROSCOPIC GASTRIC BANDING     and reversal  . LAPAROSCOPIC GASTRIC SLEEVE RESECTION N/A 08/26/2016   Procedure: LAPAROSCOPIC GASTRIC SLEEVE RESECTION WITH UPPER ENOD;  Surgeon: Johnathan Hausen, MD;  Location: [facility];  Service: General;  Laterality: N/A;  . TONSILLECTOMY    . TRANSTHORACIC ECHOCARDIOGRAM  11/2013   EF 65-70%. Normal diastolic Fxn.  Normal Valves.   Family History  Problem Relation Age of Onset  . Heart attack Mother   . Heart failure Mother   . Stroke Father    Social History   Socioeconomic History  . Marital status: Single    Spouse name: Not on file  . Number of children: Not on file  . Years of education: Not on file  . Highest education level: Not on file  Occupational History  . Occupation: retired  Tobacco Use  . Smoking status: Former Smoker    Packs/day: 0.25    Years: 10.00    Pack years: 2.50    Types: Cigarettes  . Smokeless tobacco: Never Used  . Tobacco comment: quit 20 years ago  Substance and Sexual Activity  . Alcohol use: Yes    Alcohol/week: 1.0 standard drinks    Types: 1 Glasses of wine per week    Comment: occasional  . Drug use: No  . Sexual activity: Not Currently  Other Topics Concern  . Not on file  Social History Narrative  . Not on file   Social Determinants of Health   Financial Resource Strain: Low Risk   . Difficulty of Paying Living Expenses: Not hard at all  Food Insecurity: No Food Insecurity  . Worried About Charity fundraiser in the Last Year: Never true  . Ran Out of Food in the Last Year: Never true  Transportation Needs: No Transportation Needs  . Lack of Transportation (Medical): No  . Lack of Transportation (Non-Medical): No  Physical Activity: Inactive  . Days of Exercise per Week: 0 days  . Minutes of Exercise per Session: 0 min  Stress: No Stress Concern Present  . Feeling of Stress : Not at all  Social Connections: Unknown  . Frequency of Communication with Friends and Family:  Three times a week  . Frequency of Social Gatherings with Friends and Family: Never  . Attends Religious Services: More than 4 times per year  . Active Member of Clubs or Organizations: Yes  . Attends Archivist Meetings: More than 4 times per year  . Marital Status: Not on file    Outpatient Encounter Medications as of 02/08/2019  Medication Sig  . B Complex-C (B-COMPLEX WITH VITAMIN C) tablet Take 1 tablet by mouth daily.  . carvedilol (COREG CR) 20 MG 24 hr capsule TAKE 1 CAPSULE DAILY  . Cholecalciferol 5000 units TABS Take 5,000 Units by mouth daily.  . Colchicine 0.6 MG CAPS TAKE 1 CAPSULE DAILY AS    NEEDED  . Cyanocobalamin (VITAMIN B-12) 2500 MCG SUBL  Take 2,500 mcg by mouth daily.  . diclofenac sodium (VOLTAREN) 1 % GEL APPLY 2 GRAMS TO AFFECTED AREA(S) 4 TIMES DAILY AS NEEDED  . gabapentin (NEURONTIN) 300 MG capsule Take 300 mg by mouth at bedtime.   . insulin degludec (TRESIBA FLEXTOUCH) 100 UNIT/ML SOPN FlexTouch Pen Inject 0.09 mLs (9 Units total) into the skin daily at 10 pm. (Patient taking differently: Inject 14 Units into the skin daily at 10 pm. )  . levothyroxine (SYNTHROID) 25 MCG tablet Take 0.5-1 tablets (12.5-25 mcg total) by mouth See admin instructions. Take 0.5 tablet (12.5 mcg) by mouth on Mondays through Fridays, then take 1 tablet (25 mcg) by mouth on Saturdays and Sundays.  [name] losartan (COZAAR) 50 MG tablet Take 1 tablet (50 mg total) by mouth 2 (two) times daily.  . Multiple Vitamins-Minerals (HAIR/SKIN/NAILS/BIOTIN PO) Take 1 tablet by mouth daily.  . Multiple Vitamins-Minerals (MULTIVITAMIN WITH MINERALS) tablet Take 1 tablet by mouth daily. Women's 50+  . naproxen sodium (ANAPROX) 220 MG tablet Take 440 mg by mouth 2 (two) times daily as needed (pain).   [name] OZEMPIC, 1 MG/DOSE, 2 MG/1.5ML SOPN INJECT INTO SKIN ONCE A WEEK  . simvastatin (ZOCOR) 10 MG tablet Take 1 tablet (10 mg total) by mouth daily.  [name] SYNTHROID 25 MCG tablet Take 0.5 tablet (12.5  mcg) by mouth on Mondays through Fridays, then take 1 tablet (25 mcg) by mouth on Saturdays and Sundays.  [name] docusate sodium (COLACE) 100 MG capsule Take 1 capsule (100 mg total) by mouth 2 (two) times daily. (Patient not taking: Reported on 07/05/2018)   No facility-administered encounter medications on file as of 02/08/2019.    Activities of Daily Living In your present state of health, do you have any difficulty performing the following activities: 02/08/2019  Hearing? N  Vision? N  Difficulty concentrating or making decisions? N  Walking or climbing stairs? Y  Dressing or bathing? N  Doing errands, shopping? N  Preparing Food and eating ? N  Using the Toilet? N  In the past six months, have you accidently leaked urine? Y  Comment first thing in the morning  Do you have problems with loss of bowel control? N  Managing your Medications? N  Managing your Finances? N  Housekeeping or managing your Housekeeping? N  Some recent data might be hidden    Patient Care Team: Glendale Chard, MD as PCP - General (Internal Medicine) Daneen Schick as Social Worker [place], Claudette Stapler, RN as Case Manager Lavera Guise, [facility] (Pharmacist)    Assessment:   This is a routine wellness examination for [place].  Exercise Activities and Dietary recommendations Current Exercise Habits: The patient does not participate in regular exercise at present  Goals    . "I am having trouble managing my blood sugar" (pt-stated)     Current Barriers:  . Social Isolation due to COVID 19 . Inability to attend regular exercise program due to COVID 19 . Recent weight gain of 4 pounds  Clinical Social Work Clinical Goal(s):  [name] Over the next 45 days, patient will work with CCM RN Case Manager to address needs related to disease management  Interventions: . Patient interviewed and appropriate assessments performed . Discussed plans with patient for ongoing care management follow up and provided patient with  direct contact information for care management team  . Gathered history from the patient - the patient reports she underwent bariatric surgery in 2018 and normally participates in water aerobics. Due to COVID  19 the patients exercise regimen has been interrupted. The patient has gained 4 pounds and is noticing her blood sugar is running higher than normal . Patient is on her way to exercise at the [facility] pool downtown.  She has signed up for a pool time slot due to [place].  She is looking forward to getting back into her exercise routine.  She is still using precautions. . Provided the patient with education surrounding the CCM program and roles of each care team member . Collaboration with CCM RN Case Manager who will follow up with the patient regarding patient stated goal  Patient Self Care Activities:  . Self administers medications as prescribed . Attends all scheduled provider appointments . Performs ADL's independently  Please see past updates related to this goal by clicking on the "Past Updates" button in the selected goal      . Assist with Chronic Disease Management and Care Coordination      Current Barriers:  [name] Knowledge Barriers related to resources and support available to address needs related to Chronic Disease Management and Care Coordination   Case Manager Clinical Goal(s):  [name] Over the next 30 days, patient will work with the CCM team to address needs related to Chronic disease management and Community Resources  Interventions:  . Collaborated with BSW and initiated plan of care to address needs related to chronic disease management for DMII and medication management  Patient Self Care Activities:  . Attends all scheduled provider appointments . Calls provider office for new concerns or questions  Initial goal documentation     . I want to continue to optimize medication management of my chronic conditons (pt-stated)     Current Barriers:  . Non Adherence to  prescribed medication regimen . Financial Barriers  Pharmacist Clinical Goal(s):  [name] Over the next 90 days, patient will work with CCM team & PCP to address needs related to optimized medication management of chronic conditions.  Interventions: . Comprehensive medication review performed. . Advised patient to continue eating an ADA recommended diet.  Reviewed food options for snacks, etc.   . Patient reports FBGs has not exceeded 140 and continues to improve due to increased physical activity.  She denies hypoglycemia.  She has now increased her dose of Tresiba back to 14 units at bedtime (was previously taking 7-10 units).  She reports she has had to increase her insulin due to "higher" blood sugars.  She explains that her diet and activity is not as it was pre-COVID.  She continues to display self awareness regarding her diabetes.  New refill request sent in to [facility] for new dose through patient assistance program.  [facility] delivered medications to office on Friday, however office is closed on Friday.  Called to request expedited delivery this week. . She is motivated to continue her progress.  She is actively participating in [employer] classes.  She is also working again from home.  .  . Diabetes regimen optimized at this time with Tresiba 14 units daily and weekly Ozempic 0.5mg .  Last A1c was 6.4% on 08/31/18. [name] Antihypertensive regimen: carvedilol, losartan . Antihyperlipidemic regimen:  Patient reports taking statin therapy MWF and reports compliance, however patient has not filled this year per fill data report.  Refill requested from CMA to be called into to [facility] mail order for MWF statin #39 for 90 days.  EMR generated simvastatin 10mg  daily #90 RX.  Patient filled on 01/09/19--she states she is trying to take daily (  filled for #90 days).  Will correct fill for next time.  Also, encouraged patient to continue taking aspirin 81mg  as prescribed by MD . Will continue to follow    Patient Self Care Activities:  . Self administers medications as prescribed . Attends all scheduled provider appointments . Calls pharmacy for medication refills  Please see past updates related to this goal by clicking on the "Past Updates" button in the selected goal      . I would like to (pt-stated)    . Weight (lb) < 200 lb (90.7 kg) (pt-stated)     Wants to get to 170 pounds    . Weight (lb) < 200 lb (90.7 kg)     02/08/2019, wants to get down to 170-200       Fall Risk Fall Risk  02/08/2019 08/31/2018 08/31/2018 08/31/2018 07/05/2018  Falls in the past year? 0 - 0 0 0  Number falls in past yr: - - 0 - -  Comment - - - - -  Injury with Fall? - - - - -  Risk for fall due to : Impaired balance/gait;Impaired mobility;Medication side effect Impaired balance/gait Orthopedic patient - -  Follow up Falls evaluation completed;Education provided;Falls prevention discussed Falls evaluation completed;Education provided Falls evaluation completed;Education provided;Falls prevention discussed - -   Is the patient's home free of loose throw rugs in walkways, pet beds, electrical cords, etc?   yes      Grab bars in the bathroom? yes      Handrails on the stairs?   yes      Adequate lighting?   yes  Timed Get Up and Go performed: n/a  Depression Screen PHQ 2/9 Scores 02/08/2019 08/31/2018 07/15/2018 07/05/2018  PHQ - 2 Score 0 0 0 0  PHQ- 9 Score 0 - - -     Cognitive Function     6CIT Screen 02/08/2019 02/04/2018  What Year? 0 points 0 points  What month? 0 points 0 points  What time? 0 points 0 points  Count back from 20 0 points 0 points  Months in reverse 0 points 0 points  Repeat phrase 0 points 0 points  Total Score 0 0    Immunization History  Administered Date(s) Administered  . Influenza, High Dose Seasonal PF 12/21/2017, 11/10/2018  . Influenza,inj,quad, With Preservative 02/25/2016  . Influenza-Unspecified 10/25/2016  . Pneumococcal Polysaccharide-23 11/02/2013  .  Zoster Recombinat (Shingrix) 12/31/2018    Qualifies for Shingles Vaccine? yes  Screening Tests Health Maintenance  Topic Date Due  . PNA vac Low Risk Adult (2 of 2 - PCV13) 11/24/2017  . OPHTHALMOLOGY EXAM  05/06/2019  . HEMOGLOBIN A1C  07/17/2019  . MAMMOGRAM  12/12/2019  . FOOT EXAM  01/17/2020  . COLONOSCOPY  09/14/2022  . TETANUS/TDAP  07/03/2025  . INFLUENZA VACCINE  Completed  . DEXA SCAN  Completed  . Hepatitis C Screening  Completed    Cancer Screenings: Lung: Low Dose CT Chest recommended if Age 75-80 years, 30 pack-year currently smoking OR have quit w/in 15years. Patient does not qualify. Breast:  Up to date on Mammogram? Yes   Up to date of Bone Density/Dexa? Yes Colorectal: up to date  Additional Screenings: : Hepatitis C Screening: 03/05/2012     Plan:    patient wants to get down to 170-200 pounds.   I have personally reviewed and noted the following in the patient's chart:   . Medical and social history . Use of alcohol, tobacco or illicit drugs  .  Current medications and supplements . Functional ability and status . Nutritional status . Physical activity . Advanced directives . List of other physicians . Hospitalizations, surgeries, and ER visits in previous 12 months . Vitals . Screenings to include cognitive, depression, and falls . Referrals and appointments  In addition, I have reviewed and discussed with patient certain preventive protocols, quality metrics, and best practice recommendations. A written personalized care plan for preventive services as well as general preventive health recommendations were provided to patient.     Kellie Simmering, LPN  [id number]

## 2019-02-08 NOTE — Patient Instructions (Signed)
Teresa Golden , Thank you for taking time to come for your Medicare Wellness Visit. I appreciate your ongoing commitment to your health goals. Please review the following plan we discussed and let me know if I can assist you in the future.   Screening recommendations/referrals: Colonoscopy: 08/2012 Mammogram: scheduled for January Bone Density: 04/2018 Recommended yearly ophthalmology/optometry visit for glaucoma screening and checkup Recommended yearly dental visit for hygiene and checkup  Vaccinations: Influenza vaccine: 10/2018 Pneumococcal vaccine: 11/2016 Tdap vaccine: 06/2015 Shingles vaccine: 12/2018    Advanced directives: Please bring a copy of your POA (Power of [place]) and/or Living Will to your next appointment.    Conditions/risks identified: obesity  Next appointment: 04/19/2019 at 11:00   Preventive Care 21 Years and Older, Female Preventive care refers to lifestyle choices and visits with your health care provider that can promote health and wellness. What does preventive care include?  A yearly physical exam. This is also called an annual well check.  Dental exams once or twice a year.  Routine eye exams. Ask your health care provider how often you should have your eyes checked.  Personal lifestyle choices, including:  Daily care of your teeth and gums.  Regular physical activity.  Eating a healthy diet.  Avoiding tobacco and drug use.  Limiting alcohol use.  Practicing safe sex.  Taking low-dose aspirin every day.  Taking vitamin and mineral supplements as recommended by your health care provider. What happens during an annual well check? The services and screenings done by your health care provider during your annual well check will depend on your age, overall health, lifestyle risk factors, and family history of disease. Counseling  Your health care provider may ask you questions about your:  Alcohol use.  Tobacco use.  Drug use.  Emotional  well-being.  Home and relationship well-being.  Sexual activity.  Eating habits.  History of falls.  Memory and ability to understand (cognition).  Work and work Statistician.  Reproductive health. Screening  You may have the following tests or measurements:  Height, weight, and BMI.  Blood pressure.  Lipid and cholesterol levels. These may be checked every 5 years, or more frequently if you are over 32 years old.  Skin check.  Lung cancer screening. You may have this screening every year starting at age 20 if you have a 30-pack-year history of smoking and currently smoke or have quit within the past 15 years.  Fecal occult blood test (FOBT) of the stool. You may have this test every year starting at age 76.  Flexible sigmoidoscopy or colonoscopy. You may have a sigmoidoscopy every 5 years or a colonoscopy every 10 years starting at age 67.  Hepatitis C blood test.  Hepatitis B blood test.  Sexually transmitted disease (STD) testing.  Diabetes screening. This is done by checking your blood sugar (glucose) after you have not eaten for a while (fasting). You may have this done every 1-3 years.  Bone density scan. This is done to screen for osteoporosis. You may have this done starting at age 88.  Mammogram. This may be done every 1-2 years. Talk to your health care provider about how often you should have regular mammograms. Talk with your health care provider about your test results, treatment options, and if necessary, the need for more tests. Vaccines  Your health care provider may recommend certain vaccines, such as:  Influenza vaccine. This is recommended every year.  Tetanus, diphtheria, and acellular pertussis (Tdap, Td) vaccine. You may need a  Td booster every 10 years.  Zoster vaccine. You may need this after age 75.  Pneumococcal 13-valent conjugate (PCV13) vaccine. One dose is recommended after age 65.  Pneumococcal polysaccharide (PPSV23) vaccine. One  dose is recommended after age 47. Talk to your health care provider about which screenings and vaccines you need and how often you need them. This information is not intended to replace advice given to you by your health care provider. Make sure you discuss any questions you have with your health care provider. Document Released: 03/09/2015 Document Revised: 10/31/2015 Document Reviewed: 12/12/2014 Elsevier Interactive Patient Education  2017 [place] Prevention in the Home Falls can cause injuries. They can happen to people of all ages. There are many things you can do to make your home safe and to help prevent falls. What can I do on the outside of my home?  Regularly fix the edges of walkways and driveways and fix any cracks.  Remove anything that might make you trip as you walk through a door, such as a raised step or threshold.  Trim any bushes or trees on the path to your home.  Use bright outdoor lighting.  Clear any walking paths of anything that might make someone trip, such as rocks or tools.  Regularly check to see if handrails are loose or broken. Make sure that both sides of any steps have handrails.  Any raised decks and porches should have guardrails on the edges.  Have any leaves, snow, or ice cleared regularly.  Use sand or salt on walking paths during winter.  Clean up any spills in your garage right away. This includes oil or grease spills. What can I do in the bathroom?  Use night lights.  Install grab bars by the toilet and in the tub and shower. Do not use towel bars as grab bars.  Use non-skid mats or decals in the tub or shower.  If you need to sit down in the shower, use a plastic, non-slip stool.  Keep the floor dry. Clean up any water that spills on the floor as soon as it happens.  Remove soap buildup in the tub or shower regularly.  Attach bath mats securely with double-sided non-slip rug tape.  Do not have throw rugs and other  things on the floor that can make you trip. What can I do in the bedroom?  Use night lights.  Make sure that you have a light by your bed that is easy to reach.  Do not use any sheets or blankets that are too big for your bed. They should not hang down onto the floor.  Have a firm chair that has side arms. You can use this for support while you get dressed.  Do not have throw rugs and other things on the floor that can make you trip. What can I do in the kitchen?  Clean up any spills right away.  Avoid walking on wet floors.  Keep items that you use a lot in easy-to-reach places.  If you need to reach something above you, use a strong step stool that has a grab bar.  Keep electrical cords out of the way.  Do not use floor polish or wax that makes floors slippery. If you must use wax, use non-skid floor wax.  Do not have throw rugs and other things on the floor that can make you trip. What can I do with my stairs?  Do not leave any items on the stairs.  Make sure that there are handrails on both sides of the stairs and use them. Fix handrails that are broken or loose. Make sure that handrails are as long as the stairways.  Check any carpeting to make sure that it is firmly attached to the stairs. Fix any carpet that is loose or worn.  Avoid having throw rugs at the top or bottom of the stairs. If you do have throw rugs, attach them to the floor with carpet tape.  Make sure that you have a light switch at the top of the stairs and the bottom of the stairs. If you do not have them, ask someone to add them for you. What else can I do to help prevent falls?  Wear shoes that:  Do not have high heels.  Have rubber bottoms.  Are comfortable and fit you well.  Are closed at the toe. Do not wear sandals.  If you use a stepladder:  Make sure that it is fully opened. Do not climb a closed stepladder.  Make sure that both sides of the stepladder are locked into place.  Ask  someone to hold it for you, if possible.  Clearly mark and make sure that you can see:  Any grab bars or handrails.  First and last steps.  Where the edge of each step is.  Use tools that help you move around (mobility aids) if they are needed. These include:  Canes.  Walkers.  Scooters.  Crutches.  Turn on the lights when you go into a dark area. Replace any light bulbs as soon as they burn out.  Set up your furniture so you have a clear path. Avoid moving your furniture around.  If any of your floors are uneven, fix them.  If there are any pets around you, be aware of where they are.  Review your medicines with your doctor. Some medicines can make you feel dizzy. This can increase your chance of falling. Ask your doctor what other things that you can do to help prevent falls. This information is not intended to replace advice given to you by your health care provider. Make sure you discuss any questions you have with your health care provider. Document Released: 12/07/2008 Document Revised: 07/19/2015 Document Reviewed: 03/17/2014 Elsevier Interactive Patient Education  2017 [employer].

## 2019-02-10 ENCOUNTER — Ambulatory Visit (INDEPENDENT_AMBULATORY_CARE_PROVIDER_SITE_OTHER): Payer: Medicare HMO | Admitting: Psychiatry

## 2019-02-10 DIAGNOSIS — R69 Illness, unspecified: Secondary | ICD-10-CM | POA: Diagnosis not present

## 2019-02-10 DIAGNOSIS — F4323 Adjustment disorder with mixed anxiety and depressed mood: Secondary | ICD-10-CM | POA: Diagnosis not present

## 2019-02-20 ENCOUNTER — Encounter: Payer: Self-pay | Admitting: Internal Medicine

## 2019-02-23 ENCOUNTER — Encounter: Payer: Self-pay | Admitting: Internal Medicine

## 2019-02-24 ENCOUNTER — Ambulatory Visit (INDEPENDENT_AMBULATORY_CARE_PROVIDER_SITE_OTHER): Payer: Medicare HMO | Admitting: Psychiatry

## 2019-02-24 DIAGNOSIS — F4323 Adjustment disorder with mixed anxiety and depressed mood: Secondary | ICD-10-CM

## 2019-02-24 DIAGNOSIS — F509 Eating disorder, unspecified: Secondary | ICD-10-CM | POA: Diagnosis not present

## 2019-02-24 DIAGNOSIS — R69 Illness, unspecified: Secondary | ICD-10-CM | POA: Diagnosis not present

## 2019-02-28 ENCOUNTER — Other Ambulatory Visit: Payer: Self-pay

## 2019-02-28 ENCOUNTER — Other Ambulatory Visit: Payer: Self-pay | Admitting: Internal Medicine

## 2019-02-28 ENCOUNTER — Ambulatory Visit (INDEPENDENT_AMBULATORY_CARE_PROVIDER_SITE_OTHER): Payer: Medicare HMO | Admitting: Pharmacist

## 2019-02-28 DIAGNOSIS — N183 Chronic kidney disease, stage 3 unspecified: Secondary | ICD-10-CM

## 2019-02-28 DIAGNOSIS — E1122 Type 2 diabetes mellitus with diabetic chronic kidney disease: Secondary | ICD-10-CM

## 2019-02-28 MED ORDER — FREESTYLE LIBRE 2 READER SYSTM DEVI: 1.0000 | 2 refills | Status: DC

## 2019-02-28 MED ORDER — FREESTYLE LIBRE 14 DAY SENSOR MISC: 12 refills | Status: DC

## 2019-03-01 ENCOUNTER — Other Ambulatory Visit: Payer: Self-pay | Admitting: Pharmacy Technician

## 2019-03-01 ENCOUNTER — Telehealth: Payer: Self-pay

## 2019-03-01 ENCOUNTER — Ambulatory Visit: Payer: Self-pay

## 2019-03-01 ENCOUNTER — Other Ambulatory Visit: Payer: Self-pay

## 2019-03-01 DIAGNOSIS — N183 Chronic kidney disease, stage 3 unspecified: Secondary | ICD-10-CM

## 2019-03-01 DIAGNOSIS — E1122 Type 2 diabetes mellitus with diabetic chronic kidney disease: Secondary | ICD-10-CM

## 2019-03-01 DIAGNOSIS — I129 Hypertensive chronic kidney disease with stage 1 through stage 4 chronic kidney disease, or unspecified chronic kidney disease: Secondary | ICD-10-CM

## 2019-03-01 DIAGNOSIS — Z6835 Body mass index (BMI) 35.0-35.9, adult: Secondary | ICD-10-CM

## 2019-03-01 NOTE — Patient Outreach (Signed)
[facility]) Care Management  03/01/2019  Teresa Golden Dec 20, 1945 [id number]                                       Medication Assistance Referral  Referral From: [facility] Embedded RPh Jenne Pane   Medication/Company: Larna Daughters / [employer] Patient application portion:  Mailed Provider application portion: Faxed  to Dr. Johnnye Lana Provider address/fax verified via: Office website   Follow up:  Will follow up with patient in 10-14 business days to confirm application(s) have been received.  Maud Deed Chana Bode Riner Certified Pharmacy Technician [place] Management Direct [phone]

## 2019-03-03 ENCOUNTER — Ambulatory Visit (INDEPENDENT_AMBULATORY_CARE_PROVIDER_SITE_OTHER): Payer: Medicare HMO | Admitting: Psychiatry

## 2019-03-03 DIAGNOSIS — F4323 Adjustment disorder with mixed anxiety and depressed mood: Secondary | ICD-10-CM | POA: Diagnosis not present

## 2019-03-03 DIAGNOSIS — R69 Illness, unspecified: Secondary | ICD-10-CM | POA: Diagnosis not present

## 2019-03-03 DIAGNOSIS — F509 Eating disorder, unspecified: Secondary | ICD-10-CM

## 2019-03-03 NOTE — Patient Instructions (Signed)
Visit Information  Goals Addressed            This Visit's Progress     Patient Stated   . I want to continue to optimize medication management of my chronic conditons (pt-stated)       Current Barriers:  . Non Adherence to prescribed medication regimen . Financial Barriers  Pharmacist Clinical Goal(s):  [name] Over the next 90 days, patient will work with CCM team & PCP to address needs related to optimized medication management of chronic conditions.  Interventions: Call completed on 02/28/2019 . Comprehensive medication review performed. . Advised patient to continue eating an ADA recommended diet.  Reviewed food options for snacks, etc.   . Patient reports FBGs has not exceeded 130 and continues to improve due to increased physical activity.  She denies hypoglycemia.  She has now increased her dose of Tresiba back to 14 units at bedtime (was previously taking 7-10 units).  She reports she has had to increase her insulin due to "higher" blood sugars.  She explains that her diet and activity is not as it was pre-COVID.  She continues to display self awareness regarding her diabetes.  New refill request sent in to [employer] for new dose through patient assistance program.  [employer] delivered medications to office on Friday, however office is closed on Friday.  Called to request expedited delivery this week. . She is motivated to continue her progress.  She is actively participating in [employer] classes.  She is also working again from home.  .  . Diabetes regimen optimized at this time with Tresiba 14 units daily and weekly Ozempic 0.5mg .  Last A1c was 6.4% on 08/31/18. [name] Antihypertensive regimen: carvedilol, losartan . Antihyperlipidemic regimen:  Patient reports taking statin therapy MWF and reports compliance, however patient has not filled this year per fill data report.  Refill requested from CMA to be [facility] for MWF statin #39 for 90 days.  EMR generated  simvastatin 10mg  daily #90 RX.  Patient filled on 01/09/19--she states she is trying to take daily (filled for #90 days).  Will correct fill for next time.  Also, encouraged patient to continue taking aspirin 81mg  as prescribed by MD . Will continue to follow   Patient Self Care Activities:  . Self administers medications as prescribed . Attends all scheduled provider appointments . Calls pharmacy for medication refills  Please see past updates related to this goal by clicking on the "Past Updates" button in the selected goal         The patient verbalized understanding of instructions provided today and declined a print copy of patient instruction materials.   The care management team will reach out to the patient again over the next 60 days.   SIGNATURE Regina Eck, PharmD, BCPS Clinical Pharmacist, [facility] [place]: [phone]

## 2019-03-03 NOTE — Progress Notes (Signed)
Chronic Care Management   Visit Note  02/28/2019 Name: Teresa Golden MRN: [id number] DOB: [birth year]  Referred by: Glendale Chard, MD Reason for referral : Chronic Care Management   Teresa Golden is a 74 y.o. year old female who is a primary care patient of Glendale Chard, MD. The CCM team was consulted for assistance with chronic disease management and care coordination needs related to HLD and DMII  Review of patient status, including review of consultants reports, relevant laboratory and other test results, and collaboration with appropriate care team members and the patient's provider was performed as part of comprehensive patient evaluation and provision of chronic care management services.    I spoke with [name] by telephone today.  Medications: Outpatient Encounter Medications as of 02/28/2019  Medication Sig Note  . B Complex-C (B-COMPLEX WITH VITAMIN C) tablet Take 1 tablet by mouth daily.   . carvedilol (COREG CR) 20 MG 24 hr capsule TAKE 1 CAPSULE DAILY   . Cholecalciferol 5000 units TABS Take 5,000 Units by mouth daily.   . Colchicine 0.6 MG CAPS TAKE 1 CAPSULE DAILY AS    NEEDED   . Cyanocobalamin (VITAMIN B-12) 2500 MCG SUBL Take 2,500 mcg by mouth daily.   . diclofenac Sodium (VOLTAREN) 1 % GEL APPLY 2 GRAMS TO AFFECTED AREA(S) 4 TIMES DAILY AS NEEDED   . docusate sodium (COLACE) 100 MG capsule Take 1 capsule (100 mg total) by mouth 2 (two) times daily. (Patient not taking: Reported on 07/05/2018)   . gabapentin (NEURONTIN) 300 MG capsule Take 300 mg by mouth at bedtime.    . insulin degludec (TRESIBA FLEXTOUCH) 100 UNIT/ML SOPN FlexTouch Pen Inject 0.09 mLs (9 Units total) into the skin daily at 10 pm. (Patient taking differently: Inject 14 Units into the skin daily at 10 pm. )   . levothyroxine (SYNTHROID) 25 MCG tablet Take 0.5-1 tablets (12.5-25 mcg total) by mouth See admin instructions. Take 0.5 tablet (12.5 mcg) by mouth on Mondays through Fridays, then take 1 tablet  (25 mcg) by mouth on Saturdays and Sundays.   [name] losartan (COZAAR) 50 MG tablet TAKE 1 TABLET BY MOUTH EVERY DAY   . Multiple Vitamins-Minerals (HAIR/SKIN/NAILS/BIOTIN PO) Take 1 tablet by mouth daily.   . Multiple Vitamins-Minerals (MULTIVITAMIN WITH MINERALS) tablet Take 1 tablet by mouth daily. Women's 50+   . naproxen sodium (ANAPROX) 220 MG tablet Take 440 mg by mouth 2 (two) times daily as needed (pain).    [name] OZEMPIC, 1 MG/DOSE, 2 MG/1.5ML SOPN INJECT INTO SKIN ONCE A WEEK   . simvastatin (ZOCOR) 10 MG tablet Take 1 tablet (10 mg total) by mouth daily. 02/28/2019: Mon-Fri only  . SYNTHROID 25 MCG tablet Take 0.5 tablet (12.5 mcg) by mouth on Mondays through Fridays, then take 1 tablet (25 mcg) by mouth on Saturdays and Sundays.    No facility-administered encounter medications on file as of 02/28/2019.     Objective:   Goals Addressed            This Visit's Progress     Patient Stated   . I want to continue to optimize medication management of my chronic conditons (pt-stated)       Current Barriers:  . Non Adherence to prescribed medication regimen . Financial Barriers  Pharmacist Clinical Goal(s):  [name] Over the next 90 days, patient will work with CCM team & PCP to address needs related to optimized medication management of chronic conditions.  Interventions: Call completed on 02/28/2019 .  Comprehensive medication review performed. . Advised patient to continue eating an ADA recommended diet.  Reviewed food options for snacks, etc.   . Patient reports FBGs has not exceeded 130 and continues to improve due to increased physical activity.  She denies hypoglycemia.  She has now increased her dose of Tresiba back to 14 units at bedtime (was previously taking 7-10 units).  She reports she has had to increase her insulin due to "higher" blood sugars.  She explains that her diet and activity is not as it was pre-COVID.  She continues to display self awareness regarding her diabetes.  New  refill request sent in to [employer] for new dose through patient assistance program.  [employer] delivered medications to office on Friday, however office is closed on Friday.  Called to request expedited delivery this week. . She is motivated to continue her progress.  She is actively participating in [employer] classes.  She is also working again from home.  .  . Diabetes regimen optimized at this time with Tresiba 14 units daily and weekly Ozempic 0.5mg .  Last A1c was 6.4% on 08/31/18. [name] Antihypertensive regimen: carvedilol, losartan . Antihyperlipidemic regimen:  Patient reports taking statin therapy MWF and reports compliance, however patient has not filled this year per fill data report.  Refill requested from CMA [facility] MWF statin #39 for 90 days.  EMR generated simvastatin 10mg  daily #90 RX.  Patient filled on 01/09/19--she states she is trying to take daily (filled for #90 days).  Will correct fill for next time.  Also, encouraged patient to continue taking aspirin 81mg  as prescribed by MD . Will continue to follow   Patient Self Care Activities:  . Self administers medications as prescribed . Attends all scheduled provider appointments . Calls pharmacy for medication refills  Please see past updates related to this goal by clicking on the "Past Updates" button in the selected goal         Plan:   The care management team will reach out to the patient again over the next 60 days.   Provider Signature Regina Eck, PharmD, BCPS Clinical Pharmacist, [facility] [place]: [phone]

## 2019-03-03 NOTE — Chronic Care Management (AMB) (Signed)
Chronic Care Management   Follow Up Note   03/01/2019 Name: Teresa Golden MRN: [id number] DOB: 1945-06-01  Referred by: Glendale Chard, MD Reason for referral : Chronic Care Management (INITIAL CCM RNCM Telephone Outreach )   Teresa Golden is a 74 y.o. year old female who is a primary care patient of Glendale Chard, MD. The CCM team was consulted for assistance with chronic disease management and care coordination needs.    Review of patient status, including review of consultants reports, relevant laboratory and other test results, and collaboration with appropriate care team members and the patient's provider was performed as part of comprehensive patient evaluation and provision of chronic care management services.    SDOH (Social Determinants of Health) screening performed today: None. See Care Plan for related entries.   Placed outbound call to patient for a CCM RN CM update.   Outpatient Encounter Medications as of 03/01/2019  Medication Sig Note  . B Complex-C (B-COMPLEX WITH VITAMIN C) tablet Take 1 tablet by mouth daily.   . carvedilol (COREG CR) 20 MG 24 hr capsule TAKE 1 CAPSULE DAILY   . Cholecalciferol 5000 units TABS Take 5,000 Units by mouth daily.   . Colchicine 0.6 MG CAPS TAKE 1 CAPSULE DAILY AS    NEEDED   . Continuous Blood Gluc Receiver (FREESTYLE LIBRE 2 READER SYSTM) DEVI 1 Device by Does not apply route every 6 (six) months. Use to test blood sugars daily with [facility] sensor. E11.65   . Continuous Blood Gluc Sensor (FREESTYLE LIBRE 14 DAY SENSOR) MISC Use to test blood sugar daily/ E11.65   . Cyanocobalamin (VITAMIN B-12) 2500 MCG SUBL Take 2,500 mcg by mouth daily.   . diclofenac Sodium (VOLTAREN) 1 % GEL APPLY 2 GRAMS TO AFFECTED AREA(S) 4 TIMES DAILY AS NEEDED   . docusate sodium (COLACE) 100 MG capsule Take 1 capsule (100 mg total) by mouth 2 (two) times daily. (Patient not taking: Reported on 07/05/2018)   . gabapentin (NEURONTIN) 300 MG capsule Take 300 mg by  mouth at bedtime.    . insulin degludec (TRESIBA FLEXTOUCH) 100 UNIT/ML SOPN FlexTouch Pen Inject 0.09 mLs (9 Units total) into the skin daily at 10 pm. (Patient taking differently: Inject 14 Units into the skin daily at 10 pm. )   . levothyroxine (SYNTHROID) 25 MCG tablet Take 0.5-1 tablets (12.5-25 mcg total) by mouth See admin instructions. Take 0.5 tablet (12.5 mcg) by mouth on Mondays through Fridays, then take 1 tablet (25 mcg) by mouth on Saturdays and Sundays.   [name] losartan (COZAAR) 50 MG tablet TAKE 1 TABLET BY MOUTH EVERY DAY   . Multiple Vitamins-Minerals (HAIR/SKIN/NAILS/BIOTIN PO) Take 1 tablet by mouth daily.   . Multiple Vitamins-Minerals (MULTIVITAMIN WITH MINERALS) tablet Take 1 tablet by mouth daily. Women's 50+   . naproxen sodium (ANAPROX) 220 MG tablet Take 440 mg by mouth 2 (two) times daily as needed (pain).    [name] OZEMPIC, 1 MG/DOSE, 2 MG/1.5ML SOPN INJECT INTO SKIN ONCE A WEEK   . simvastatin (ZOCOR) 10 MG tablet Take 1 tablet (10 mg total) by mouth daily. 02/28/2019: Mon-Fri only  . SYNTHROID 25 MCG tablet Take 0.5 tablet (12.5 mcg) by mouth on Mondays through Fridays, then take 1 tablet (25 mcg) by mouth on Saturdays and Sundays.    No facility-administered encounter medications on file as of 03/01/2019.    Lab Results  Component Value Date   HGBA1C 6.4 (H) 01/17/2019   HGBA1C 6.4 (H) 08/31/2018  HGBA1C 6.2 (H) 04/26/2018   Lab Results  Component Value Date   MICROALBUR 10 01/17/2019   LDLCALC 82 01/17/2019   CREATININE 0.92 01/17/2019   BP Readings from Last 3 Encounters:  02/08/19 140/90  01/17/19 124/80  09/29/18 126/80    Goals Addressed      Patient Stated   . "I am having trouble managing my blood sugar" (pt-stated)       Current Barriers:  [name] Knowledge Deficits related to Diabetes Mellitus disease process and Self Health Management  . Chronic Disease Management support and education needs related to DMII, CKDIII, HTN, Class 2 Severe Obesity  Clinical  Goal(s):  . 03/01/19 New - Over the next 90 days, patient will work with CCM RN Case Manager to address needs related to disease education and support for Diabetes disease management and weight loss management  CCM RN CM Interventions: 03/01/19 call completed with patient . Evaluation of current treatment plan related to Diabetes Mellitus and patient's adherence to plan as established by provider. . Provided education to patient re: current A1C of 6.4 obtained on 01/17/19; discussed patient's target A1C goal is to achieve <5.6; education reinforced on ways to achieve this goal including adherence to following a diabetic friendly diet, medication adherence and implementing daily exercise; determined patient has resumed her water aerobics at the [facility] as she continues to take COVID precautions with mask wearing and social distancing . Reviewed medications with patient and discussed patient is adhering to her prescribed regimen, Tresiba 14 u sq daily; Ozempic 2 mg/1.60ml sq weekly  . Discussed plans with patient for ongoing care management follow up and provided patient with direct contact information for care management team . Provided patient with printed educational materials related to Diabetes Management with Meal Planning; BS log, Carb Choices, Carb Counting . Advised patient, providing education and rationale, to check cbg 1-2 daily before meals and record, calling the CCM team and or PCP for findings outside established parameters.    Patient Self Care Activities:  . Self administers medications as prescribed . Attends all scheduled provider appointments . Performs ADL's independently  Please see past updates related to this goal by clicking on the "Past Updates" button in the selected goal      . "To keep my BP well managed" (pt-stated)       Current Barriers:  [name] Knowledge Deficits related to Hypertension disease process and Self Health Management  . Chronic Disease Management support and  education needs related to DMII, CKDIII, HTN, Class 2 Severe Obesity   Nurse Case Manager Clinical Goal(s):  [name] Over the next 90 days, patient will work with the CCM team to address needs related to disease education and support for HTN  CCM RN CM Interventions:  03/01/19 call completed with patient . Evaluation of current treatment plan related to HTN and patient's adherence to plan as established by provider. . Provided education to patient re: target BP of 130/80 or less than . Reviewed medications with patient and discussed patient is adhering to taking her prescribed antihypertensives exactly as directed without missed doses . Discussed plans with patient for ongoing care management follow up and provided patient with direct contact information for care management team . Advised patient, providing education and rationale, to monitor blood pressure daily and record, calling the CCM team and or PCP for findings outside established parameters.  . Provided patient with printed educational materials related to What is High Blood Pressure?; Why Should I Restrict Sodium?; African American's  and High Blood Pressure, BP log, Life's Simple 7  Patient Self Care Activities:  . Self administers medications as prescribed . Attends all scheduled provider appointments . Calls pharmacy for medication refills . Performs ADL's independently . Performs IADL's independently . Calls provider office for new concerns or questions  Initial goal documentation       Other   . COMPLETED: Assist with Chronic Disease Management and Care Coordination        Current Barriers:  [name] Knowledge Barriers related to resources and support available to address needs related to Chronic Disease Management and Care Coordination   Case Manager Clinical Goal(s):  [name] Over the next 30 days, patient will work with the CCM team to address needs related to Chronic disease management and Community Resources  Interventions:   . Collaborated with BSW and initiated plan of care to address needs related to chronic disease management for DMII and medication management  Patient Self Care Activities:  . Attends all scheduled provider appointments . Calls provider office for new concerns or questions  Initial goal documentation         Telephone follow up appointment with care management team member scheduled for: 04/08/19  Barb Merino, RN, BSN, CCM Care Management Coordinator [facility] [facility]  Direct Phone: [phone]

## 2019-03-03 NOTE — Patient Instructions (Signed)
Visit Information  Goals Addressed      Patient Stated   . "I am having trouble managing my blood sugar" (pt-stated)       Current Barriers:  [name] Knowledge Deficits related to Diabetes Mellitus disease process and Self Health Management  . Chronic Disease Management support and education needs related to DMII, CKDIII, HTN, Class 2 Severe Obesity  Clinical Goal(s):  . 03/01/19 New - Over the next 90 days, patient will work with CCM RN Case Manager to address needs related to disease education and support for Diabetes disease management and weight loss management  CCM RN CM Interventions: 03/01/19 call completed with patient . Evaluation of current treatment plan related to Diabetes Mellitus and patient's adherence to plan as established by provider. . Provided education to patient re: current A1C of 6.4 obtained on 01/17/19; discussed patient's target A1C goal is to achieve <5.6; education reinforced on ways to achieve this goal including adherence to following a diabetic friendly diet, medication adherence and implementing daily exercise; determined patient has resumed her water aerobics at the [facility] as she continues to take COVID precautions with mask wearing and social distancing . Reviewed medications with patient and discussed patient is adhering to her prescribed regimen, Tresiba 14 u sq daily; Ozempic 2 mg/1.19ml sq weekly  . Discussed plans with patient for ongoing care management follow up and provided patient with direct contact information for care management team . Provided patient with printed educational materials related to Diabetes Management with Meal Planning; BS log, Carb Choices, Carb Counting . Advised patient, providing education and rationale, to check cbg 1-2 daily before meals and record, calling the CCM team and or PCP for findings outside established parameters.    Patient Self Care Activities:  . Self administers medications as prescribed . Attends all scheduled  provider appointments . Performs ADL's independently  Please see past updates related to this goal by clicking on the "Past Updates" button in the selected goal      . "To keep my BP well managed" (pt-stated)       Current Barriers:  [name] Knowledge Deficits related to Hypertension disease process and Self Health Management  . Chronic Disease Management support and education needs related to DMII, CKDIII, HTN, Class 2 Severe Obesity   Nurse Case Manager Clinical Goal(s):  [name] Over the next 90 days, patient will work with the CCM team to address needs related to disease education and support for HTN  CCM RN CM Interventions:  03/01/19 call completed with patient . Evaluation of current treatment plan related to HTN and patient's adherence to plan as established by provider. . Provided education to patient re: target BP of 130/80 or less than . Reviewed medications with patient and discussed patient is adhering to taking her prescribed antihypertensives exactly as directed without missed doses . Discussed plans with patient for ongoing care management follow up and provided patient with direct contact information for care management team . Advised patient, providing education and rationale, to monitor blood pressure daily and record, calling the CCM team and or PCP for findings outside established parameters.  . Provided patient with printed educational materials related to What is High Blood Pressure?; Why Should I Restrict Sodium?; African American's and High Blood Pressure, BP log, Life's Simple 7  Patient Self Care Activities:  . Self administers medications as prescribed . Attends all scheduled provider appointments . Calls pharmacy for medication refills . Performs ADL's independently . Performs IADL's independently . Calls provider office  for new concerns or questions  Initial goal documentation       Other   . COMPLETED: Assist with Chronic Disease Management and Care Coordination         Current Barriers:  [name] Knowledge Barriers related to resources and support available to address needs related to Chronic Disease Management and Care Coordination   Case Manager Clinical Goal(s):  [name] Over the next 30 days, patient will work with the CCM team to address needs related to Chronic disease management and Community Resources  Interventions:  . Collaborated with BSW and initiated plan of care to address needs related to chronic disease management for DMII and medication management  Patient Self Care Activities:  . Attends all scheduled provider appointments . Calls provider office for new concerns or questions  Initial goal documentation        The patient verbalized understanding of instructions provided today and declined a print copy of patient instruction materials.   Telephone follow up appointment with care management team member scheduled for: 04/08/19  Barb Merino, RN, BSN, CCM Care Management Coordinator [place] [facility]  Direct Phone: [phone]

## 2019-03-04 ENCOUNTER — Ambulatory Visit: Payer: Self-pay

## 2019-03-04 DIAGNOSIS — N183 Chronic kidney disease, stage 3 unspecified: Secondary | ICD-10-CM

## 2019-03-04 DIAGNOSIS — I129 Hypertensive chronic kidney disease with stage 1 through stage 4 chronic kidney disease, or unspecified chronic kidney disease: Secondary | ICD-10-CM

## 2019-03-04 DIAGNOSIS — E66812 Obesity, class 2: Secondary | ICD-10-CM

## 2019-03-04 DIAGNOSIS — Z6835 Body mass index (BMI) 35.0-35.9, adult: Secondary | ICD-10-CM

## 2019-03-04 DIAGNOSIS — E1122 Type 2 diabetes mellitus with diabetic chronic kidney disease: Secondary | ICD-10-CM

## 2019-03-04 NOTE — Chronic Care Management (AMB) (Signed)
Chronic Care Management   Follow Up Note   03/04/2019 Name: Teresa Golden MRN: [id number] DOB: 1945/09/16  Referred by: Glendale Chard, MD Reason for referral : Chronic Care Management (CCM RNCM Telephone Follow up )   Teresa Golden is a 74 y.o. year old female who is a primary care patient of Glendale Chard, MD. The CCM team was consulted for assistance with chronic disease management and care coordination needs.    Review of patient status, including review of consultants reports, relevant laboratory and other test results, and collaboration with appropriate care team members and the patient's provider was performed as part of comprehensive patient evaluation and provision of chronic care management services.    SDOH (Social Determinants of Health) screening performed today: None. See Care Plan for related entries.   Inbound call received from Teresa Golden with questions about how she should space out her COVID vaccines with her 2nd Shingles vaccine.  Outpatient Encounter Medications as of 03/04/2019  Medication Sig Note  . B Complex-C (B-COMPLEX WITH VITAMIN C) tablet Take 1 tablet by mouth daily.   . carvedilol (COREG CR) 20 MG 24 hr capsule TAKE 1 CAPSULE DAILY   . Cholecalciferol 5000 units TABS Take 5,000 Units by mouth daily.   . Colchicine 0.6 MG CAPS TAKE 1 CAPSULE DAILY AS    NEEDED   . Continuous Blood Gluc Receiver (FREESTYLE LIBRE 2 READER SYSTM) DEVI 1 Device by Does not apply route every 6 (six) months. Use to test blood sugars daily with [facility] sensor. E11.65   . Continuous Blood Gluc Sensor (FREESTYLE LIBRE 14 DAY SENSOR) MISC Use to test blood sugar daily/ E11.65   . Cyanocobalamin (VITAMIN B-12) 2500 MCG SUBL Take 2,500 mcg by mouth daily.   . diclofenac Sodium (VOLTAREN) 1 % GEL APPLY 2 GRAMS TO AFFECTED AREA(S) 4 TIMES DAILY AS NEEDED   . docusate sodium (COLACE) 100 MG capsule Take 1 capsule (100 mg total) by mouth 2 (two) times daily. (Patient not taking: Reported on  07/05/2018)   . gabapentin (NEURONTIN) 300 MG capsule Take 300 mg by mouth at bedtime.    . insulin degludec (TRESIBA FLEXTOUCH) 100 UNIT/ML SOPN FlexTouch Pen Inject 0.09 mLs (9 Units total) into the skin daily at 10 pm. (Patient taking differently: Inject 14 Units into the skin daily at 10 pm. )   . levothyroxine (SYNTHROID) 25 MCG tablet Take 0.5-1 tablets (12.5-25 mcg total) by mouth See admin instructions. Take 0.5 tablet (12.5 mcg) by mouth on Mondays through Fridays, then take 1 tablet (25 mcg) by mouth on Saturdays and Sundays.   [name] losartan (COZAAR) 50 MG tablet TAKE 1 TABLET BY MOUTH EVERY DAY   . Multiple Vitamins-Minerals (HAIR/SKIN/NAILS/BIOTIN PO) Take 1 tablet by mouth daily.   . Multiple Vitamins-Minerals (MULTIVITAMIN WITH MINERALS) tablet Take 1 tablet by mouth daily. Women's 50+   . naproxen sodium (ANAPROX) 220 MG tablet Take 440 mg by mouth 2 (two) times daily as needed (pain).    [name] OZEMPIC, 1 MG/DOSE, 2 MG/1.5ML SOPN INJECT INTO SKIN ONCE A WEEK   . simvastatin (ZOCOR) 10 MG tablet Take 1 tablet (10 mg total) by mouth daily. 02/28/2019: Mon-Fri only  . SYNTHROID 25 MCG tablet Take 0.5 tablet (12.5 mcg) by mouth on Mondays through Fridays, then take 1 tablet (25 mcg) by mouth on Saturdays and Sundays.    No facility-administered encounter medications on file as of 03/04/2019.     Goals Addressed      Patient  Stated   . "To better understand how to space out my COVID vaccines with my Shingles vaccine" (pt-stated)       Current Barriers:  [name] Knowledge Deficits related to vaccine schedule for COVID and Shingles vaccine . Chronic Disease Management support and education needs related to DMII, CKDIII, HTN, Class 2 Severe Obesity  Nurse Case Manager Clinical Goal(s):  [name] Over the next 30 days, patient will verbalize understanding of plan for receiving her 1st COVID vaccine injection  CCM RN CM Interventions:  03/04/19 call completed with patient . Inbound call received from Ms.  Schoen with questions concerning the COVID vaccine, specifically when should she receive the vaccine . Determined Teresa Golden is now eligible to receive the first COVID vaccine however she is concerned about the timing of her COVID vaccines as she is due for her 2nd Shingles vaccine on or around 04/11/19 . Discussed she was previously advised by Dr. Baird Cancer regarding other vaccines, that when needing multiple vaccines, these should be spread out and not received too close together . Discussed checking with Dr. Baird Cancer what her recommendations will be concerning the vaccines and a follow up call to Ms. Tagliaferro will be completed afterwards . Collaborated with Dr. Baird Cancer via in basket message regarding her recommendations concerning Teresa Golden COVID vaccines in addition to her 2nd Shingles vaccine due in February . Discussed plans with patient for ongoing care management follow up and provided patient with direct contact information for care management team  03/04/19 Placed outbound call to patient . Advised patient Dr. Baird Cancer has provided the following recommendations;  . "wait until two weeks after getting her second shingles vaccine before getting the COVID vaccine. However, if she has some time to get shingles vaccine (large window for second shot) she may be able to get both COVID vaccines, wait a month, and then get second shingles vaccine." . Discussed Ms. Granzow plans to receive her Shingles vaccine around mid February as directed and will wait 2 weeks to receive the COVID vaccine - she is appreciative of the call . Determined per review of the CDC.gov website it is recommended to wait 3-4 weeks in between the 1st and 2nd COVID vaccine depending on which manufacturer is used between [employer] and [employer] . Sent patient a secure message via mychart, providing her with the following website to review at her leisure; [email or web address]  Patient  Self Care Activities:  . Self administers medications as prescribed . Attends all scheduled provider appointments . Calls pharmacy for medication refills . Performs ADL's independently . Performs IADL's independently . Calls provider office for new concerns or questions  Initial goal documentation     . I want to continue to optimize medication management of my chronic conditons (pt-stated)       Current Barriers:  . Non Adherence to prescribed medication regimen . Financial Barriers  Pharmacist Clinical Goal(s):  [name] Over the next 90 days, patient will work with CCM team & PCP to address needs related to optimized medication management of chronic conditions.  Interventions: Call completed on 02/28/2019 . Comprehensive medication review performed. . Advised patient to continue eating an ADA recommended diet.  Reviewed food options for snacks, etc.   . Patient reports FBGs has not exceeded 130 and continues to improve due to increased physical activity.  She denies hypoglycemia.  She has now increased her dose of Tresiba back to 14 units at bedtime (was previously taking 7-10 units).  She reports she has had to increase her  insulin due to "higher" blood sugars.  She explains that her diet and activity is not as it was pre-COVID.  She continues to display self awareness regarding her diabetes.  New refill request sent in to [employer] for new dose through patient assistance program.  [employer] delivered medications to office on Friday, however office is closed on Friday.  Called to request expedited delivery this week. . She is motivated to continue her progress.  She is actively participating in [employer] classes.  She is also working again from home.  .  . Diabetes regimen optimized at this time with Tresiba 14 units daily and weekly Ozempic 0.5mg .  Last A1c was 6.4% on 08/31/18. [name] Antihypertensive regimen: carvedilol, losartan . Antihyperlipidemic regimen:  Patient reports taking  statin therapy MWF and reports compliance, however patient has not filled this year per fill data report.  Refill requested from CMA to be called into to [facility] mail order for MWF statin #39 for 90 days.  EMR generated simvastatin 10mg  daily #90 RX.  Patient filled on 01/09/19--she states she is trying to take daily (filled for #90 days).  Will correct fill for next time.  Also, encouraged patient to continue taking aspirin 81mg  as prescribed by MD . Will continue to follow   03/04/19 Inbound call completed with patient . Determined Ms. Lesniewski learned her Libre Freestyle glucose meter was denied under Medicare Part D and must be filed as DME under Medicare Part B . Discussed having embedded Pharm D Lottie Dawson reach out to her to offer assistance  . Sent secure message to Gaspar Garbe D with an update concerning Ms. Hearst's call today re: her [facility] authorization . Reply received from Almyra Free stating she will follow up with Ms. Sedano next week   Patient Self Care Activities:  . Self administers medications as prescribed . Attends all scheduled provider appointments . Calls pharmacy for medication refills  Please see past updates related to this goal by clicking on the "Past Updates" button in the selected goal          Follow up with provider re: how to instruct patient on spacing out her COVID vaccines with her Shingles vaccine  Barb Merino, RN, BSN, CCM Care Management Coordinator [place] [facility]  Direct Phone: [phone]

## 2019-03-04 NOTE — Patient Instructions (Addendum)
Visit Information  Goals Addressed      Patient Stated   . "To better understand how to space out my COVID vaccines with my Shingles vaccine" (pt-stated)       Current Barriers:  [name] Knowledge Deficits related to vaccine schedule for COVID and Shingles vaccine . Chronic Disease Management support and education needs related to DMII, CKDIII, HTN, Class 2 Severe Obesity  Nurse Case Manager Clinical Goal(s):  [name] Over the next 30 days, patient will verbalize understanding of plan for receiving her 1st COVID vaccine injection  CCM RN CM Interventions:  03/04/19 call completed with patient . Inbound call received from Ms. Spira with questions concerning the COVID vaccine, specifically when should she receive the vaccine . Determined Ms. Hosier is now eligible to receive the first COVID vaccine however she is concerned about the timing of her COVID vaccines as she is due for her 2nd Shingles vaccine on or around 04/11/19 . Discussed she was previously advised by Dr. Baird Cancer regarding other vaccines, that when needing multiple vaccines, these should be spread out and not received too close together . Discussed checking with Dr. Baird Cancer what her recommendations will be concerning the vaccines and a follow up call to Ms. Sonntag will be completed afterwards . Collaborated with Dr. Baird Cancer via in basket message regarding her recommendations concerning Ms. Whites COVID vaccines in addition to her 2nd Shingles vaccine due in February . Discussed plans with patient for ongoing care management follow up and provided patient with direct contact information for care management team  03/04/19 Placed outbound call to patient . Advised patient Dr. Baird Cancer has provided the following recommendations;  . "wait until two weeks after getting her second shingles vaccine before getting the COVID vaccine. However, if she has some time to get shingles vaccine (large window for second shot) she may be able to get both COVID  vaccines, wait a month, and then get second shingles vaccine." . Discussed Ms. Rossin plans to receive her Shingles vaccine around mid February as directed and will wait 2 weeks to receive the COVID vaccine - she is appreciative of the call . Determined per review of the CDC.gov website it is recommended to wait 3-4 weeks in between the 1st and 2nd COVID vaccine depending on which manufacturer is used between [employer] and [employer] . Sent patient a secure message via mychart, providing her with the following website to review at her leisure; [email or web address]  Patient Self Care Activities:  . Self administers medications as prescribed . Attends all scheduled provider appointments . Calls pharmacy for medication refills . Performs ADL's independently . Performs IADL's independently . Calls provider office for new concerns or questions  Initial goal documentation     . I want to continue to optimize medication management of my chronic conditons (pt-stated)       Current Barriers:  . Non Adherence to prescribed medication regimen . Financial Barriers  Pharmacist Clinical Goal(s):  [name] Over the next 90 days, patient will work with CCM team & PCP to address needs related to optimized medication management of chronic conditions.  Interventions: Call completed on 02/28/2019 . Comprehensive medication review performed. . Advised patient to continue eating an ADA recommended diet.  Reviewed food options for snacks, etc.   . Patient reports FBGs has not exceeded 130 and continues to improve due to increased physical activity.  She denies hypoglycemia.  She has now increased her dose of Tresiba back to 14 units at bedtime (was previously taking 7-10  units).  She reports she has had to increase her insulin due to "higher" blood sugars.  She explains that her diet and activity is not as it was pre-COVID.  She continues to display self awareness  regarding her diabetes.  New refill request sent in to [employer] for new dose through patient assistance program.  [employer] delivered medications to office on Friday, however office is closed on Friday.  Called to request expedited delivery this week. . She is motivated to continue her progress.  She is actively participating in [employer] classes.  She is also working again from home.  .  . Diabetes regimen optimized at this time with Tresiba 14 units daily and weekly Ozempic 0.5mg .  Last A1c was 6.4% on 08/31/18. [name] Antihypertensive regimen: carvedilol, losartan . Antihyperlipidemic regimen:  Patient reports taking statin therapy MWF and reports compliance, however patient has not filled this year per fill data report.  Refill requested from CMA [facility] MWF statin #39 for 90 days.  EMR generated simvastatin 10mg  daily #90 RX.  Patient filled on 01/09/19--she states she is trying to take daily (filled for #90 days).  Will correct fill for next time.  Also, encouraged patient to continue taking aspirin 81mg  as prescribed by MD . Will continue to follow   03/04/19 Inbound call completed with patient . Determined Ms. Dejarnett learned her Libre Freestyle glucose meter was denied under Medicare Part D and must be filed as DME under Medicare Part B . Discussed having embedded Pharm D Lottie Dawson reach out to her to offer assistance  . Sent secure message to Gaspar Garbe D with an update concerning Ms. Bridgett's call today re: her [facility] authorization . Reply received from Almyra Free stating she will follow up with Ms. Busler next week   Patient Self Care Activities:  . Self administers medications as prescribed . Attends all scheduled provider appointments . Calls pharmacy for medication refills  Please see past updates related to this goal by clicking on the "Past Updates" button in the selected goal         The patient verbalized understanding of  instructions provided today and declined a print copy of patient instruction materials.   Telephone follow up appointment with care management team member scheduled for: 04/08/19  Barb Merino, RN, BSN, CCM Care Management Coordinator [place] [facility]  Direct Phone: [phone]

## 2019-03-07 ENCOUNTER — Ambulatory Visit: Payer: Self-pay | Admitting: Pharmacist

## 2019-03-07 ENCOUNTER — Encounter (HOSPITAL_COMMUNITY): Payer: Self-pay

## 2019-03-07 DIAGNOSIS — E1122 Type 2 diabetes mellitus with diabetic chronic kidney disease: Secondary | ICD-10-CM

## 2019-03-07 DIAGNOSIS — N183 Chronic kidney disease, stage 3 unspecified: Secondary | ICD-10-CM

## 2019-03-07 MED ORDER — FREESTYLE LIBRE 2 READER SYSTM DEVI: 1.0000 | 2 refills | Status: DC

## 2019-03-07 MED ORDER — FREESTYLE LIBRE 14 DAY SENSOR MISC: 12 refills | Status: DC

## 2019-03-07 NOTE — Progress Notes (Signed)
Chronic Care Management  Visit Note  03/07/2019 Name: Teresa Golden MRN: [id number] DOB: Sep 17, 1945  Referred by: Glendale Chard, MD Reason for referral : Chronic Care Management (Diabetes)   Teresa Golden is a 74 y.o. year old female who is a primary care patient of Glendale Chard, MD. The CCM team was consulted for assistance with chronic disease management and care coordination needs related to DMII  Review of patient status, including review of consultants reports, relevant laboratory and other test results, and collaboration with appropriate care team members and the patient's provider was performed as part of comprehensive patient evaluation and provision of chronic care management services.    I spoke with Teresa Golden by telephone today.  Medications: Outpatient Encounter Medications as of 03/07/2019  Medication Sig Note  . B Complex-C (B-COMPLEX WITH VITAMIN C) tablet Take 1 tablet by mouth daily.   . carvedilol (COREG CR) 20 MG 24 hr capsule TAKE 1 CAPSULE DAILY   . Cholecalciferol 5000 units TABS Take 5,000 Units by mouth daily.   . Colchicine 0.6 MG CAPS TAKE 1 CAPSULE DAILY AS    NEEDED   . Continuous Blood Gluc Receiver (FREESTYLE LIBRE 2 READER SYSTM) DEVI 1 Device by Does not apply route every 6 (six) months. Use to test blood sugars daily with [facility] sensor. E11.65   . Continuous Blood Gluc Sensor (FREESTYLE LIBRE 14 DAY SENSOR) MISC Use to test blood sugar daily/ E11.65   . Cyanocobalamin (VITAMIN B-12) 2500 MCG SUBL Take 2,500 mcg by mouth daily.   . diclofenac Sodium (VOLTAREN) 1 % GEL APPLY 2 GRAMS TO AFFECTED AREA(S) 4 TIMES DAILY AS NEEDED   . docusate sodium (COLACE) 100 MG capsule Take 1 capsule (100 mg total) by mouth 2 (two) times daily. (Patient not taking: Reported on 07/05/2018)   . gabapentin (NEURONTIN) 300 MG capsule Take 300 mg by mouth at bedtime.    . insulin degludec (TRESIBA FLEXTOUCH) 100 UNIT/ML SOPN FlexTouch Pen Inject 0.09 mLs (9 Units total) into the  skin daily at 10 pm. (Patient taking differently: Inject 14 Units into the skin daily at 10 pm. )   . levothyroxine (SYNTHROID) 25 MCG tablet Take 0.5-1 tablets (12.5-25 mcg total) by mouth See admin instructions. Take 0.5 tablet (12.5 mcg) by mouth on Mondays through Fridays, then take 1 tablet (25 mcg) by mouth on Saturdays and Sundays.   [name] losartan (COZAAR) 50 MG tablet TAKE 1 TABLET BY MOUTH EVERY DAY   . Multiple Vitamins-Minerals (HAIR/SKIN/NAILS/BIOTIN PO) Take 1 tablet by mouth daily.   . Multiple Vitamins-Minerals (MULTIVITAMIN WITH MINERALS) tablet Take 1 tablet by mouth daily. Women's 50+   . naproxen sodium (ANAPROX) 220 MG tablet Take 440 mg by mouth 2 (two) times daily as needed (pain).    [name] OZEMPIC, 1 MG/DOSE, 2 MG/1.5ML SOPN INJECT INTO SKIN ONCE A WEEK   . simvastatin (ZOCOR) 10 MG tablet Take 1 tablet (10 mg total) by mouth daily. 02/28/2019: Mon-Fri only  . SYNTHROID 25 MCG tablet Take 0.5 tablet (12.5 mcg) by mouth on Mondays through Fridays, then take 1 tablet (25 mcg) by mouth on Saturdays and Sundays.   . [DISCONTINUED] Continuous Blood Gluc Receiver (FREESTYLE LIBRE 2 READER SYSTM) DEVI 1 Device by Does not apply route every 6 (six) months. Use to test blood sugars daily with [facility] sensor. E11.65   . [DISCONTINUED] Continuous Blood Gluc Sensor (FREESTYLE LIBRE 14 DAY SENSOR) MISC Use to test blood sugar daily/ E11.65    No facility-administered  encounter medications on file as of 03/07/2019.     Objective:   Goals Addressed            This Visit's Progress     Patient Stated   . I want to continue to optimize medication management of my chronic conditons (pt-stated)       Current Barriers:  . Non Adherence to prescribed medication regimen . Financial Barriers  Pharmacist Clinical Goal(s):  [name] Over the next 90 days, patient will work with CCM team & PCP to address needs related to optimized medication management of chronic conditions.  Interventions: Call  completed on 03/07/2019 . Comprehensive medication review performed. . Advised patient to continue eating an ADA recommended diet.  Reviewed food options for snacks, etc.   . Patient reports FBGs has not exceeded 130. She denies hypoglycemia.  She has now increased her dose of Tresiba back to 14 units at bedtime (was previously taking 7-10 units).  She reports she has had to increase her insulin due to "higher" blood sugars.  She explains that her diet and activity is not as it was pre-COVID.  She continues to display self awareness regarding her diabetes. She is motivated to continue her progress.  She is not participating in workout classes (cancelled due to [place]).  She is also working again from home.  . Diabetes regimen optimized at this time with Tresiba 14 units daily and weekly Ozempic 0.5mg .  Last A1c was 6.4% on 01/17/20. o Attempting to get patient Teresa Golden system, however unlikely covered based on medicare guidelines . Antihypertensive regimen: carvedilol, losartan . Antihyperlipidemic regimen:  Patient reports taking statin therapy MWF and reports compliance, however patient has not filled this year per fill data report.  Refill requested from CMA [facility] MWF statin #39 for 90 days.  EMR generated simvastatin 10mg  daily #90 RX.  Patient filled on 01/09/19--she states she is trying to take daily (filled for #90 days).  Will correct fill for next time.  Also, encouraged patient to continue taking aspirin 81mg  as prescribed by MD . Will continue to follow   03/04/19 Inbound call completed with patient . Determined Teresa Golden learned her Libre Freestyle glucose meter was denied under Medicare Part D and must be filed as DME under Medicare Part B . Discussed having embedded Pharm D Lottie Dawson reach out to her to offer assistance  . Sent secure message to Gaspar Garbe D with an update concerning [name]'s call today re: her [facility]  authorization . Reply received from Almyra Free stating she will follow up with Ms. Grable next week   Patient Self Care Activities:  . Self administers medications as prescribed . Attends all scheduled provider appointments . Calls pharmacy for medication refills  Please see past updates related to this goal by clicking on the "Past Updates" button in the selected goal          Plan:   The care management team will reach out to the patient again over the next 30 days.     Provider Signature Regina Eck, PharmD, BCPS Clinical Pharmacist, [facility] [place]: [phone]

## 2019-03-07 NOTE — Patient Instructions (Signed)
Visit Information  Goals Addressed            This Visit's Progress     Patient Stated   . I want to continue to optimize medication management of my chronic conditons (pt-stated)       Current Barriers:  . Non Adherence to prescribed medication regimen . Financial Barriers  Pharmacist Clinical Goal(s):  [name] Over the next 90 days, patient will work with CCM team & PCP to address needs related to optimized medication management of chronic conditions.  Interventions: Call completed on 03/07/2019 . Comprehensive medication review performed. . Advised patient to continue eating an ADA recommended diet.  Reviewed food options for snacks, etc.   . Patient reports FBGs has not exceeded 130. She denies hypoglycemia.  She has now increased her dose of Tresiba back to 14 units at bedtime (was previously taking 7-10 units).  She reports she has had to increase her insulin due to "higher" blood sugars.  She explains that her diet and activity is not as it was pre-COVID.  She continues to display self awareness regarding her diabetes. She is motivated to continue her progress.  She is not participating in workout classes (cancelled due to [place]).  She is also working again from home.  . Diabetes regimen optimized at this time with Tresiba 14 units daily and weekly Ozempic 0.5mg .  Last A1c was 6.4% on 01/17/20. o Attempting to get patient Elen[facility] Leg[place] system, however unlikely covered based on medicare guidelines . Antihypertensive regimen: carvedilol, losartan . Antihyperlipidemic regimen:  Patient reports taking statin therapy MWF and reports compliance, however patient has not filled this year per fill data report.  Refill requested from CMA [facility] MWF statin #39 for 90 days.  EMR generated simvastatin 10mg  daily #90 RX.  Patient filled on 01/09/19--she states she is trying to take daily (filled for #90 days).  Will correct fill for next time.  Also, encouraged patient to  continue taking aspirin 81mg  as prescribed by MD . Will continue to follow   03/04/19 Inbound call completed with patient . Determined Ms. Colgate learned her Libre Freestyle glucose meter was denied under Medicare Part D and must be filed as DME under Medicare Part B . Discussed having embedded Pharm D Lottie Dawson reach out to her to offer assistance  . Sent secure message to Gaspar Garbe D with an update concerning Ms. Collyer's call today re: her [facility] authorization . Reply received from Almyra Free stating she will follow up with Ms. Moure next week   Patient Self Care Activities:  . Self administers medications as prescribed . Attends all scheduled provider appointments . Calls pharmacy for medication refills  Please see past updates related to this goal by clicking on the "Past Updates" button in the selected goal         The patient verbalized understanding of instructions provided today and declined a print copy of patient instruction materials.   The care management team will reach out to the patient again over the next 30 days.   SIGNATURE Regina Eck, PharmD, BCPS Clinical Pharmacist, [facility] [place]: [phone]

## 2019-03-08 ENCOUNTER — Telehealth: Payer: Self-pay

## 2019-03-08 DIAGNOSIS — Z20828 Contact with and (suspected) exposure to other viral communicable diseases: Secondary | ICD-10-CM | POA: Diagnosis not present

## 2019-03-08 NOTE — Telephone Encounter (Signed)
The pt was notified that the diclofenc gel is sold o-t-c and the pt said that she has been getting the volteran gel o-t-c from the pharmacy.

## 2019-03-09 ENCOUNTER — Ambulatory Visit: Payer: Self-pay

## 2019-03-09 DIAGNOSIS — E66812 Obesity, class 2: Secondary | ICD-10-CM

## 2019-03-09 DIAGNOSIS — Z6835 Body mass index (BMI) 35.0-35.9, adult: Secondary | ICD-10-CM

## 2019-03-09 DIAGNOSIS — E1122 Type 2 diabetes mellitus with diabetic chronic kidney disease: Secondary | ICD-10-CM

## 2019-03-09 DIAGNOSIS — N183 Chronic kidney disease, stage 3 unspecified: Secondary | ICD-10-CM

## 2019-03-09 DIAGNOSIS — I129 Hypertensive chronic kidney disease with stage 1 through stage 4 chronic kidney disease, or unspecified chronic kidney disease: Secondary | ICD-10-CM

## 2019-03-10 ENCOUNTER — Ambulatory Visit (INDEPENDENT_AMBULATORY_CARE_PROVIDER_SITE_OTHER): Payer: Medicare HMO | Admitting: Psychiatry

## 2019-03-10 ENCOUNTER — Telehealth: Payer: Self-pay

## 2019-03-10 DIAGNOSIS — F509 Eating disorder, unspecified: Secondary | ICD-10-CM | POA: Diagnosis not present

## 2019-03-10 DIAGNOSIS — F4323 Adjustment disorder with mixed anxiety and depressed mood: Secondary | ICD-10-CM | POA: Diagnosis not present

## 2019-03-10 NOTE — Telephone Encounter (Signed)
Pt called asking if a letter that was written for her can be adjusted. Pt stated she would stop by the office today with the original letter, and what she would need added to the letter

## 2019-03-10 NOTE — Chronic Care Management (AMB) (Signed)
  Chronic Care Management   Outreach Note  03/09/2019 Name: Teresa Golden MRN: [id number] DOB: Apr 19, 1945  Referred by: Glendale Chard, MD Reason for referral : Chronic Care Management (CCM RNCM Telephone Follow up)   Inbound call received from patient today. Her voice message states she needs assistance with a letter written by Dr. Baird Cancer that will allow her to return to school to advance her college degree. Placed an outbound call to [name] to address her needs but unfortunately she is not available to speak with me at this time. The patient was referred to the case management team by Glendale Chard MD for assistance with care management and care coordination.   Follow Up Plan: Telephone follow up appointment with care management team member scheduled for: 04/08/19  Barb Merino, RN, BSN, CCM Care Management Coordinator [place] [facility]  Direct Phone: [phone]

## 2019-03-11 DIAGNOSIS — Z96651 Presence of right artificial knee joint: Secondary | ICD-10-CM | POA: Diagnosis not present

## 2019-03-11 DIAGNOSIS — S76311A Strain of muscle, fascia and tendon of the posterior muscle group at thigh level, right thigh, initial encounter: Secondary | ICD-10-CM | POA: Diagnosis not present

## 2019-03-11 DIAGNOSIS — Z471 Aftercare following joint replacement surgery: Secondary | ICD-10-CM | POA: Diagnosis not present

## 2019-03-11 DIAGNOSIS — M7631 Iliotibial band syndrome, right leg: Secondary | ICD-10-CM | POA: Diagnosis not present

## 2019-03-12 ENCOUNTER — Encounter: Payer: Self-pay | Admitting: Internal Medicine

## 2019-03-16 ENCOUNTER — Ambulatory Visit: Payer: Medicare HMO

## 2019-03-16 ENCOUNTER — Encounter: Payer: Self-pay | Admitting: Internal Medicine

## 2019-03-17 ENCOUNTER — Ambulatory Visit: Payer: Medicare HMO | Admitting: Psychiatry

## 2019-03-17 DIAGNOSIS — M6281 Muscle weakness (generalized): Secondary | ICD-10-CM | POA: Insufficient documentation

## 2019-03-17 DIAGNOSIS — M25561 Pain in right knee: Secondary | ICD-10-CM | POA: Diagnosis not present

## 2019-03-18 ENCOUNTER — Encounter: Payer: Self-pay | Admitting: Internal Medicine

## 2019-03-18 ENCOUNTER — Ambulatory Visit: Payer: Self-pay

## 2019-03-18 ENCOUNTER — Other Ambulatory Visit: Payer: Self-pay | Admitting: Pharmacy Technician

## 2019-03-18 DIAGNOSIS — E1122 Type 2 diabetes mellitus with diabetic chronic kidney disease: Secondary | ICD-10-CM

## 2019-03-18 DIAGNOSIS — N183 Chronic kidney disease, stage 3 unspecified: Secondary | ICD-10-CM

## 2019-03-18 NOTE — Patient Instructions (Signed)
Social Worker Visit Information  Goals we discussed today:  Goals Addressed            This Visit's Progress   . Assist with linking patient to in network dental clinic       Current Barriers:  . Lack of dental care over the last 8 years . Not established with a local dental provider . Unsure of local in-network benefits to accept payor plan . History of DM II  Social Work Clinical Goal(s):  [name] Over the next 30 days the patient will work with CM team to identify local in-network dental providers in order to access maintenance dental services  CCM SW Interventions: Completed 03/18/19 . Communication received from PharmD requesting SW outreach to assist with dental care needs . Successful outbound call placed to the patient to assess care coordination needs o Patient reports she has not established care with a local dentist since moving to [place] and therfore has been without dental care approximately 8 years o Assessed for acute dental care needs- no problems self-identified at this time o Discussed importance of routine dental care especially in persons with chronic conditions such as diabetes o Determined the patient does have dental coverage offered under current BCBS plan but is unaware of dentists within network . Assessed for patient interest in contacting [place] to obtain a list of in-network providers o Patient reports she has always received impeccable care from providers she has been referred to by her primary care provider, Dr. Baird Cancer. Patient requests dental recommendations prior to contacting [place] . Collaboration with Dr. Baird Cancer requesting assistance with dental provider recommendations close to the patients home . Scheduled follow up call to the patient over the next week to assist with contacting health plan to determine which recommended providers are within network  Patient Self Care Activities:  . Patient verbalizes understanding of plan to work with CM team to identify  local dental providers . Self administers medications as prescribed . Calls pharmacy for medication refills . Performs ADL's independently . Performs IADL's independently  Initial goal documentation         Follow Up Plan: SW will follow up with the patient over the next week.  Daneen Schick, BSW, CDP Social Worker, Certified Dementia Practitioner [place] [facility] [place] [facility] [phone]

## 2019-03-18 NOTE — Patient Outreach (Signed)
[facility]) Care Management  03/18/2019  Teresa Golden December 29, 1945 [id number]   Received patient portion(s) of patient assistance application(s) for Ozempic. Faxed completed application and required documents into [employer].  Will follow up with company(ies) in 10-14 business days to check status of application(s).  Maud Deed Chana Bode [place] Certified Pharmacy Technician [place] [facility] Direct Dial:[phone]

## 2019-03-18 NOTE — Chronic Care Management (AMB) (Signed)
Chronic Care Management    Social Work Follow Up Note  03/18/2019 Name: Teresa Golden MRN: [id number] DOB: 08-28-1945  Teresa Golden is a 74 y.o. year old female who is a primary care patient of Glendale Chard, MD. The CCM team was consulted for assistance with care coordination.   Review of patient status, including review of consultants reports, other relevant assessments, and collaboration with appropriate care team members and the patient's provider was performed as part of comprehensive patient evaluation and provision of chronic care management services.    SW placed a successful outbound call to the patient to assist with care coordination needs.  Outpatient Encounter Medications as of 03/18/2019  Medication Sig Note  . B Complex-C (B-COMPLEX WITH VITAMIN C) tablet Take 1 tablet by mouth daily.   . carvedilol (COREG CR) 20 MG 24 hr capsule TAKE 1 CAPSULE DAILY   . Cholecalciferol 5000 units TABS Take 5,000 Units by mouth daily.   . Colchicine 0.6 MG CAPS TAKE 1 CAPSULE DAILY AS    NEEDED   . Continuous Blood Gluc Receiver (FREESTYLE LIBRE 2 READER SYSTM) DEVI 1 Device by Does not apply route every 6 (six) months. Use to test blood sugars daily with [facility] sensor. E11.65   . Continuous Blood Gluc Sensor (FREESTYLE LIBRE 14 DAY SENSOR) MISC Use to test blood sugar daily/ E11.65   . Cyanocobalamin (VITAMIN B-12) 2500 MCG SUBL Take 2,500 mcg by mouth daily.   . diclofenac Sodium (VOLTAREN) 1 % GEL APPLY 2 GRAMS TO AFFECTED AREA(S) 4 TIMES DAILY AS NEEDED   . docusate sodium (COLACE) 100 MG capsule Take 1 capsule (100 mg total) by mouth 2 (two) times daily. (Patient not taking: Reported on 07/05/2018)   . gabapentin (NEURONTIN) 300 MG capsule Take 300 mg by mouth at bedtime.    . insulin degludec (TRESIBA FLEXTOUCH) 100 UNIT/ML SOPN FlexTouch Pen Inject 0.09 mLs (9 Units total) into the skin daily at 10 pm. (Patient taking differently: Inject 14 Units into the skin daily at 10 pm. )   .  levothyroxine (SYNTHROID) 25 MCG tablet Take 0.5-1 tablets (12.5-25 mcg total) by mouth See admin instructions. Take 0.5 tablet (12.5 mcg) by mouth on Mondays through Fridays, then take 1 tablet (25 mcg) by mouth on Saturdays and Sundays.   [name] losartan (COZAAR) 50 MG tablet TAKE 1 TABLET BY MOUTH EVERY DAY   . Multiple Vitamins-Minerals (HAIR/SKIN/NAILS/BIOTIN PO) Take 1 tablet by mouth daily.   . Multiple Vitamins-Minerals (MULTIVITAMIN WITH MINERALS) tablet Take 1 tablet by mouth daily. Women's 50+   . naproxen sodium (ANAPROX) 220 MG tablet Take 440 mg by mouth 2 (two) times daily as needed (pain).    [name] OZEMPIC, 1 MG/DOSE, 2 MG/1.5ML SOPN INJECT INTO SKIN ONCE A WEEK   . simvastatin (ZOCOR) 10 MG tablet Take 1 tablet (10 mg total) by mouth daily. 02/28/2019: Mon-Fri only  . SYNTHROID 25 MCG tablet Take 0.5 tablet (12.5 mcg) by mouth on Mondays through Fridays, then take 1 tablet (25 mcg) by mouth on Saturdays and Sundays.    No facility-administered encounter medications on file as of 03/18/2019.     Goals Addressed            This Visit's Progress   . Assist with linking patient to in network dental clinic       Current Barriers:  . Lack of dental care over the last 8 years . Not established with a local dental provider . Unsure of local  in-network benefits to accept payor plan . History of DM II  Social Work Clinical Goal(s):  [name] Over the next 30 days the patient will work with CM team to identify local in-network dental providers in order to access maintenance dental services  CCM SW Interventions: Completed 03/18/19 . Communication received from PharmD requesting SW outreach to assist with dental care needs . Successful outbound call placed to the patient to assess care coordination needs o Patient reports she has not established care with a local dentist since moving to [place] and therfore has been without dental care approximately 8 years o Assessed for acute dental care needs- no  problems self-identified at this time o Discussed importance of routine dental care especially in persons with chronic conditions such as diabetes o Determined the patient does have dental coverage offered under current BCBS plan but is unaware of dentists within network . Assessed for patient interest in contacting [employer] to obtain a list of in-network providers o Patient reports she has always received impeccable care from providers she has been referred to by her primary care provider, Dr. Baird Cancer. Patient requests dental recommendations prior to contacting [facility] . Collaboration with Dr. Baird Cancer requesting assistance with dental provider recommendations close to the patients home . Scheduled follow up call to the patient over the next week to assist with contacting health plan to determine which recommended providers are within network  Patient Self Care Activities:  . Patient verbalizes understanding of plan to work with CM team to identify local dental providers . Self administers medications as prescribed . Calls pharmacy for medication refills . Performs ADL's independently . Performs IADL's independently  Initial goal documentation         Follow Up Plan: SW will follow up with patient by phone over the next week.   Daneen Schick, BSW, CDP Social Worker, Certified Dementia Practitioner [place] [facility] [place] [facility] [phone]  Total time spent performing care coordination and/or care management activities with the patient by phone or face to face = 12 minutes.

## 2019-03-21 ENCOUNTER — Ambulatory Visit: Payer: Self-pay

## 2019-03-21 DIAGNOSIS — I129 Hypertensive chronic kidney disease with stage 1 through stage 4 chronic kidney disease, or unspecified chronic kidney disease: Secondary | ICD-10-CM

## 2019-03-21 DIAGNOSIS — E66812 Obesity, class 2: Secondary | ICD-10-CM

## 2019-03-21 DIAGNOSIS — N183 Chronic kidney disease, stage 3 unspecified: Secondary | ICD-10-CM

## 2019-03-21 DIAGNOSIS — E1122 Type 2 diabetes mellitus with diabetic chronic kidney disease: Secondary | ICD-10-CM

## 2019-03-22 NOTE — Chronic Care Management (AMB) (Signed)
Chronic Care Management   Follow Up Note   03/21/2019 Name: Teresa Golden MRN: [id number] DOB: 1945/05/23  Referred by: Glendale Chard, MD Reason for referral : Chronic Care Management   Teresa Golden is a 74 y.o. year old female who is a primary care patient of Glendale Chard, MD. The CCM team was consulted for assistance with chronic disease management and care coordination needs.    Review of patient status, including review of consultants reports, relevant laboratory and other test results, and collaboration with appropriate care team members and the patient's provider was performed as part of comprehensive patient evaluation and provision of chronic care management services.    SDOH (Social Determinants of Health) screening performed today: None. See Care Plan for related entries.   Received inbound call from Teresa Golden requesting a call back to discuss questions regarding the COVID vaccine.   Outpatient Encounter Medications as of 03/21/2019  Medication Sig Note  . B Complex-C (B-COMPLEX WITH VITAMIN C) tablet Take 1 tablet by mouth daily.   . carvedilol (COREG CR) 20 MG 24 hr capsule TAKE 1 CAPSULE DAILY   . Cholecalciferol 5000 units TABS Take 5,000 Units by mouth daily.   . Colchicine 0.6 MG CAPS TAKE 1 CAPSULE DAILY AS    NEEDED   . Continuous Blood Gluc Receiver (FREESTYLE LIBRE 2 READER SYSTM) DEVI 1 Device by Does not apply route every 6 (six) months. Use to test blood sugars daily with [facility] sensor. E11.65   . Continuous Blood Gluc Sensor (FREESTYLE LIBRE 14 DAY SENSOR) MISC Use to test blood sugar daily/ E11.65   . Cyanocobalamin (VITAMIN B-12) 2500 MCG SUBL Take 2,500 mcg by mouth daily.   . diclofenac Sodium (VOLTAREN) 1 % GEL APPLY 2 GRAMS TO AFFECTED AREA(S) 4 TIMES DAILY AS NEEDED   . docusate sodium (COLACE) 100 MG capsule Take 1 capsule (100 mg total) by mouth 2 (two) times daily. (Patient not taking: Reported on 07/05/2018)   . gabapentin (NEURONTIN) 300 MG capsule  Take 300 mg by mouth at bedtime.    . insulin degludec (TRESIBA FLEXTOUCH) 100 UNIT/ML SOPN FlexTouch Pen Inject 0.09 mLs (9 Units total) into the skin daily at 10 pm. (Patient taking differently: Inject 14 Units into the skin daily at 10 pm. )   . levothyroxine (SYNTHROID) 25 MCG tablet Take 0.5-1 tablets (12.5-25 mcg total) by mouth See admin instructions. Take 0.5 tablet (12.5 mcg) by mouth on Mondays through Fridays, then take 1 tablet (25 mcg) by mouth on Saturdays and Sundays.   Teresa Golden losartan (COZAAR) 50 MG tablet TAKE 1 TABLET BY MOUTH EVERY DAY   . Multiple Vitamins-Minerals (HAIR/SKIN/NAILS/BIOTIN PO) Take 1 tablet by mouth daily.   . Multiple Vitamins-Minerals (MULTIVITAMIN WITH MINERALS) tablet Take 1 tablet by mouth daily. Women's 50+   . naproxen sodium (ANAPROX) 220 MG tablet Take 440 mg by mouth 2 (two) times daily as needed (pain).    Teresa Golden OZEMPIC, 1 MG/DOSE, 2 MG/1.5ML SOPN INJECT INTO SKIN ONCE A WEEK   . simvastatin (ZOCOR) 10 MG tablet Take 1 tablet (10 mg total) by mouth daily. 02/28/2019: Mon-Fri only  . SYNTHROID 25 MCG tablet Take 0.5 tablet (12.5 mcg) by mouth on Mondays through Fridays, then take 1 tablet (25 mcg) by mouth on Saturdays and Sundays.    No facility-administered encounter medications on file as of 03/21/2019.     Goals Addressed      Patient Stated   . "To better understand how to space  out my COVID vaccines with my Shingles vaccine" (pt-stated)       Current Barriers:  Teresa Golden Knowledge Deficits related to vaccine schedule for COVID and Shingles vaccine . Chronic Disease Management support and education needs related to DMII, CKDIII, HTN, Class 2 Severe Obesity  Nurse Case Manager Clinical Goal(s):  Teresa Golden Over the next 30 days, patient will verbalize understanding of plan for receiving her 1st COVID vaccine injection  CCM RN CM Interventions:  03/21/19 call completed with patient . Inbound call received from Teresa Golden with questions concerning the COVID  vaccine . Determined Teresa Golden may have the opportunity to receive the COVID vaccine in her building; discussed she was notified the director of the building has tested positive for COVID; determined Teresa Golden was exposed to this person; Teresa Golden COVID test was negative . Reviewed PCP recommendations concerning the COVID and Shingles vaccine provided by Dr. Baird Cancer  . "wait until two weeks after getting her second shingles vaccine before getting the COVID vaccine. However, if she has some time to get shingles vaccine (large window for second shot) she may be able to get both COVID vaccines, wait a month, and then get second shingles vaccine." . Alerted patient that a secure message via mychart, providing her with the following website to review at her leisure has been sent; [email or web address] . Discussed plans with patient for ongoing care management follow up and provided patient with direct contact information for care management team  Patient Self Care Activities:  . Self administers medications as prescribed . Attends all scheduled provider appointments . Calls pharmacy for medication refills . Performs ADL's independently . Performs IADL's independently . Calls provider office for new concerns or questions  Please see past updates related to this goal by clicking on the "Past Updates" button in the selected goal          Plan:   Telephone follow up appointment with care management team member scheduled for: 04/08/19  Barb Merino, RN, BSN, CCM Care Management Coordinator [place] [facility]  Direct Phone: [phone]

## 2019-03-22 NOTE — Patient Instructions (Signed)
Visit Information  Goals Addressed      Patient Stated   . "To better understand how to space out my COVID vaccines with my Shingles vaccine" (pt-stated)       Current Barriers:  [name] Knowledge Deficits related to vaccine schedule for COVID and Shingles vaccine . Chronic Disease Management support and education needs related to DMII, CKDIII, HTN, Class 2 Severe Obesity  Nurse Case Manager Clinical Goal(s):  [name] Over the next 30 days, patient will verbalize understanding of plan for receiving her 1st COVID vaccine injection  CCM RN CM Interventions:  03/21/19 call completed with patient . Inbound call received from Ms. Shimko with questions concerning the COVID vaccine . Determined Ms. Detzel may have the opportunity to receive the COVID vaccine in her building; discussed she was notified the director of the building has tested positive for COVID; determined Ms. Rutledge was exposed to this person; Ms. Barylski COVID test was negative . Reviewed PCP recommendations concerning the COVID and Shingles vaccine provided by Dr. Baird Cancer  . "wait until two weeks after getting her second shingles vaccine before getting the COVID vaccine. However, if she has some time to get shingles vaccine (large window for second shot) she may be able to get both COVID vaccines, wait a month, and then get second shingles vaccine." . Alerted patient that a secure message via mychart, providing her with the following website to review at her leisure has been sent; [email or web address] . Discussed plans with patient for ongoing care management follow up and provided patient with direct contact information for care management team  Patient Self Care Activities:  . Self administers medications as prescribed . Attends all scheduled provider appointments . Calls pharmacy for medication refills . Performs ADL's independently . Performs IADL's independently . Calls  provider office for new concerns or questions  Please see past updates related to this goal by clicking on the "Past Updates" button in the selected goal        The patient verbalized understanding of instructions provided today and declined a print copy of patient instruction materials.   Telephone follow up appointment with care management team member scheduled for: 04/08/19  Barb Merino, RN, BSN, CCM Care Management Coordinator [place] [facility]  Direct Phone: [phone]

## 2019-03-23 ENCOUNTER — Telehealth: Payer: Self-pay

## 2019-03-23 ENCOUNTER — Ambulatory Visit: Payer: Self-pay

## 2019-03-23 DIAGNOSIS — E1122 Type 2 diabetes mellitus with diabetic chronic kidney disease: Secondary | ICD-10-CM

## 2019-03-23 DIAGNOSIS — N183 Chronic kidney disease, stage 3 unspecified: Secondary | ICD-10-CM

## 2019-03-23 NOTE — Chronic Care Management (AMB) (Signed)
  Chronic Care Management   Outreach Note  03/23/2019 Name: Teresa Golden MRN: [id number] DOB: [birth year]  Referred by: Glendale Chard, MD Reason for referral : Care Coordination   SW placed an unsuccessful outbound call to the patient to assist with care coordination. SW left a HIPAA compliant voice message requesting a return call.  Follow Up Plan: The care management team will reach out to the patient again over the next 14 days.   Daneen Schick, BSW, CDP Social Worker, Certified Dementia Practitioner [place] [facility] [place] [facility] [phone]

## 2019-03-24 ENCOUNTER — Ambulatory Visit: Payer: Medicare HMO | Admitting: Psychiatry

## 2019-03-25 ENCOUNTER — Ambulatory Visit: Payer: Self-pay

## 2019-03-25 ENCOUNTER — Other Ambulatory Visit: Payer: Self-pay

## 2019-03-25 ENCOUNTER — Telehealth: Payer: Self-pay

## 2019-03-25 DIAGNOSIS — I129 Hypertensive chronic kidney disease with stage 1 through stage 4 chronic kidney disease, or unspecified chronic kidney disease: Secondary | ICD-10-CM | POA: Diagnosis not present

## 2019-03-25 DIAGNOSIS — E1122 Type 2 diabetes mellitus with diabetic chronic kidney disease: Secondary | ICD-10-CM

## 2019-03-25 DIAGNOSIS — N183 Chronic kidney disease, stage 3 unspecified: Secondary | ICD-10-CM | POA: Diagnosis not present

## 2019-03-25 NOTE — Chronic Care Management (AMB) (Signed)
Chronic Care Management   Follow Up Note   03/25/2019 Name: Teresa Golden MRN: [id number] DOB: Oct 11, 1945  Referred by: Glendale Chard, MD Reason for referral : Chronic Care Management (CCM RNCM Inbound Call )   Teresa Golden is a 74 y.o. year old female who is a primary care patient of Glendale Chard, MD. The CCM team was consulted for assistance with chronic disease management and care coordination needs.    Review of patient status, including review of consultants reports, relevant laboratory and other test results, and collaboration with appropriate care team members and the patient's provider was performed as part of comprehensive patient evaluation and provision of chronic care management services.    SDOH (Social Determinants of Health) screening performed today: home safety/need for emergency alert system. See Care Plan for related entries.   Inbound call received from patient with voice message requesting a return call to discuss resources that may assist with having an emergency alert system installed.    Outpatient Encounter Medications as of 03/25/2019  Medication Sig Note  . B Complex-C (B-COMPLEX WITH VITAMIN C) tablet Take 1 tablet by mouth daily.   . carvedilol (COREG CR) 20 MG 24 hr capsule TAKE 1 CAPSULE DAILY   . Cholecalciferol 5000 units TABS Take 5,000 Units by mouth daily.   . Colchicine 0.6 MG CAPS TAKE 1 CAPSULE DAILY AS    NEEDED   . Continuous Blood Gluc Receiver (FREESTYLE LIBRE 2 READER SYSTM) DEVI 1 Device by Does not apply route every 6 (six) months. Use to test blood sugars daily with [facility] sensor. E11.65   . Continuous Blood Gluc Sensor (FREESTYLE LIBRE 14 DAY SENSOR) MISC Use to test blood sugar daily/ E11.65   . Cyanocobalamin (VITAMIN B-12) 2500 MCG SUBL Take 2,500 mcg by mouth daily.   . diclofenac Sodium (VOLTAREN) 1 % GEL APPLY 2 GRAMS TO AFFECTED AREA(S) 4 TIMES DAILY AS NEEDED   . docusate sodium (COLACE) 100 MG capsule Take 1 capsule (100 mg  total) by mouth 2 (two) times daily. (Patient not taking: Reported on 07/05/2018)   . gabapentin (NEURONTIN) 300 MG capsule Take 300 mg by mouth at bedtime.    . insulin degludec (TRESIBA FLEXTOUCH) 100 UNIT/ML SOPN FlexTouch Pen Inject 0.09 mLs (9 Units total) into the skin daily at 10 pm. (Patient taking differently: Inject 14 Units into the skin daily at 10 pm. )   . levothyroxine (SYNTHROID) 25 MCG tablet Take 0.5-1 tablets (12.5-25 mcg total) by mouth See admin instructions. Take 0.5 tablet (12.5 mcg) by mouth on Mondays through Fridays, then take 1 tablet (25 mcg) by mouth on Saturdays and Sundays.   [name] losartan (COZAAR) 50 MG tablet TAKE 1 TABLET BY MOUTH EVERY DAY   . Multiple Vitamins-Minerals (HAIR/SKIN/NAILS/BIOTIN PO) Take 1 tablet by mouth daily.   . Multiple Vitamins-Minerals (MULTIVITAMIN WITH MINERALS) tablet Take 1 tablet by mouth daily. Women's 50+   . naproxen sodium (ANAPROX) 220 MG tablet Take 440 mg by mouth 2 (two) times daily as needed (pain).    [name] OZEMPIC, 1 MG/DOSE, 2 MG/1.5ML SOPN INJECT INTO SKIN ONCE A WEEK   . simvastatin (ZOCOR) 10 MG tablet Take 1 tablet (10 mg total) by mouth daily. 02/28/2019: Mon-Fri only  . SYNTHROID 25 MCG tablet Take 0.5 tablet (12.5 mcg) by mouth on Mondays through Fridays, then take 1 tablet (25 mcg) by mouth on Saturdays and Sundays.    No facility-administered encounter medications on file as of 03/25/2019.  Objective:  Lab Results  Component Value Date   HGBA1C 6.4 (H) 01/17/2019   HGBA1C 6.4 (H) 08/31/2018   HGBA1C 6.2 (H) 04/26/2018   Lab Results  Component Value Date   MICROALBUR 10 01/17/2019   LDLCALC 82 01/17/2019   CREATININE 0.92 01/17/2019   BP Readings from Last 3 Encounters:  02/08/19 140/90  01/17/19 124/80  09/29/18 126/80    Goals Addressed      Patient Stated   . "I need an emergency alert system" (pt-stated)       Current Barriers:  [name] Knowledge Deficits related to cost and installation of an emergency  alert system . Lacks caregiver support.   Nurse Case Manager Clinical Goal(s):  [name] Over the next 30 days, patient will work with embedded BSW to address needs related to resources needed to assist patient with understanding the cost and installation process for an emergency alert system  Interventions:  . Inbound call received from patient requesting a return phone call to discuss options/resources for having an emergency alert system installed . Collaborated with embedded BSW Daneen Schick via secure message regarding patient's request for a call back to learn more about how to initiate having an emergency alert system installed  Patient Self Care Activities:  . Self administers medications as prescribed . Attends all scheduled provider appointments . Calls pharmacy for medication refills . Attends church or other social activities . Performs ADL's independently . Performs IADL's independently . Calls provider office for new concerns or questions  Initial goal documentation        Plan:   A CCM RN CM follow up call to patient is scheduled for 04/08/19  Barb Merino, RN, BSN, CCM Care Management Coordinator [place] [facility]  Direct Phone: [phone]

## 2019-03-28 ENCOUNTER — Ambulatory Visit (INDEPENDENT_AMBULATORY_CARE_PROVIDER_SITE_OTHER): Payer: Medicare HMO

## 2019-03-28 DIAGNOSIS — N183 Chronic kidney disease, stage 3 unspecified: Secondary | ICD-10-CM | POA: Diagnosis not present

## 2019-03-28 DIAGNOSIS — E1122 Type 2 diabetes mellitus with diabetic chronic kidney disease: Secondary | ICD-10-CM | POA: Diagnosis not present

## 2019-03-29 ENCOUNTER — Other Ambulatory Visit: Payer: Self-pay | Admitting: Pharmacy Technician

## 2019-03-29 NOTE — Patient Outreach (Signed)
[place] [facility]) Care Management  03/29/2019  Teresa Golden [birth year] [id number]   Received fax approval from [place] stating patient has been approved for [employer] from 1/27 until 02/24/20. Medication be delivered to providers office ([facility].  Will remove myself from care team  Maud Deed. Chana Bode [place] Certified Pharmacy Technician [place] [facility] Direct Dial:[phone]

## 2019-03-29 NOTE — Patient Instructions (Signed)
Social Worker Visit Information  Goals we discussed today:  Goals Addressed            This Visit's Progress     Patient Stated   . COMPLETED: "I need an emergency alert system" (pt-stated)       Current Barriers:  [name] Knowledge Deficits related to cost and installation of an emergency alert system . Lacks caregiver support.   Nurse Case Manager Clinical Goal(s):  [name] Over the next 30 days, patient will work with embedded BSW to address needs related to resources needed to assist patient with understanding the cost and installation process for an emergency alert system  CCM SW Interventions: Completed 03/28/19  . Collaboration with RN Case Manager who request SW assistance with patient goal . Outbound call placed to the patient to assist with care coordination needs o Patient reports she has already contacted her health plan and signed up for the [employer] package with fall detection and GPS capable for $35.99 per month o Patient plans to receive device over the next 7 days via mail . Goal Met  Patient Self Care Activities:  . Self administers medications as prescribed . Attends all scheduled provider appointments . Calls pharmacy for medication refills . Attends church or other social activities . Performs ADL's independently . Performs IADL's independently . Calls provider office for new concerns or questions  Please see past updates related to this goal by clicking on the "Past Updates" button in the selected goal        Other   . COMPLETED: Assist with linking patient to in network dental clinic       Current Barriers:  . Lack of dental care over the last 8 years . Not established with a local dental provider . Unsure of local in-network benefits to accept payor plan . History of DM II  Social Work Clinical Goal(s):  [name] Over the next 30 days the patient will work with CM team to identify local in-network dental providers in order to access maintenance dental  services  CCM SW Interventions: Completed 03/28/19 . Outbound call placed to the patient to assess progression of patient goal . Confirmed receipt of dental providers recommended by Dr. Baird Cancer via [place] message o Patient reports she has outreached an office and is awaiting insurance verification prior to appointment scheduling . Goal Met  Patient Self Care Activities:  . Patient verbalizes understanding of plan to work with CM team to identify local dental providers . Self administers medications as prescribed . Calls pharmacy for medication refills . Performs ADL's independently . Performs IADL's independently  Please see past updates related to this goal by clicking on the "Past Updates" button in the selected goal          Follow Up Plan: No SW follow up planned at this time. Please contact me with future resource needs.   Daneen Schick, BSW, CDP Social Worker, Certified Dementia Practitioner [place] [facility] [place] [facility] [phone]

## 2019-03-29 NOTE — Chronic Care Management (AMB) (Signed)
Chronic Care Management    Social Work Follow Up Note  03/28/2019 Name: Teresa Golden MRN: [id number] DOB: 1945/08/11  Teresa Golden is a 74 y.o. year old female who is a primary care patient of Glendale Chard, MD. The CCM team was consulted for assistance with care coordination.   Review of patient status, including review of consultants reports, other relevant assessments, and collaboration with appropriate care team members and the patient's provider was performed as part of comprehensive patient evaluation and provision of chronic care management services.    SW placed an outbound call to the patient to assist with care coordination needs.   Outpatient Encounter Medications as of 03/28/2019  Medication Sig Note  . B Complex-C (B-COMPLEX WITH VITAMIN C) tablet Take 1 tablet by mouth daily.   . carvedilol (COREG CR) 20 MG 24 hr capsule TAKE 1 CAPSULE DAILY   . Cholecalciferol 5000 units TABS Take 5,000 Units by mouth daily.   . Colchicine 0.6 MG CAPS TAKE 1 CAPSULE DAILY AS    NEEDED   . Continuous Blood Gluc Receiver (FREESTYLE LIBRE 2 READER SYSTM) DEVI 1 Device by Does not apply route every 6 (six) months. Use to test blood sugars daily with [facility] sensor. E11.65   . Continuous Blood Gluc Sensor (FREESTYLE LIBRE 14 DAY SENSOR) MISC Use to test blood sugar daily/ E11.65   . Cyanocobalamin (VITAMIN B-12) 2500 MCG SUBL Take 2,500 mcg by mouth daily.   . diclofenac Sodium (VOLTAREN) 1 % GEL APPLY 2 GRAMS TO AFFECTED AREA(S) 4 TIMES DAILY AS NEEDED   . docusate sodium (COLACE) 100 MG capsule Take 1 capsule (100 mg total) by mouth 2 (two) times daily. (Patient not taking: Reported on 07/05/2018)   . gabapentin (NEURONTIN) 300 MG capsule Take 300 mg by mouth at bedtime.    . insulin degludec (TRESIBA FLEXTOUCH) 100 UNIT/ML SOPN FlexTouch Pen Inject 0.09 mLs (9 Units total) into the skin daily at 10 pm. (Patient taking differently: Inject 14 Units into the skin daily at 10 pm. )   . levothyroxine  (SYNTHROID) 25 MCG tablet Take 0.5-1 tablets (12.5-25 mcg total) by mouth See admin instructions. Take 0.5 tablet (12.5 mcg) by mouth on Mondays through Fridays, then take 1 tablet (25 mcg) by mouth on Saturdays and Sundays.   [name] losartan (COZAAR) 50 MG tablet TAKE 1 TABLET BY MOUTH EVERY DAY   . Multiple Vitamins-Minerals (HAIR/SKIN/NAILS/BIOTIN PO) Take 1 tablet by mouth daily.   . Multiple Vitamins-Minerals (MULTIVITAMIN WITH MINERALS) tablet Take 1 tablet by mouth daily. Women's 50+   . naproxen sodium (ANAPROX) 220 MG tablet Take 440 mg by mouth 2 (two) times daily as needed (pain).    [name] OZEMPIC, 1 MG/DOSE, 2 MG/1.5ML SOPN INJECT INTO SKIN ONCE A WEEK   . simvastatin (ZOCOR) 10 MG tablet Take 1 tablet (10 mg total) by mouth daily. 02/28/2019: Mon-Fri only  . SYNTHROID 25 MCG tablet Take 0.5 tablet (12.5 mcg) by mouth on Mondays through Fridays, then take 1 tablet (25 mcg) by mouth on Saturdays and Sundays.    No facility-administered encounter medications on file as of 03/28/2019.     Goals Addressed            This Visit's Progress     Patient Stated   . COMPLETED: "I need an emergency alert system" (pt-stated)       Current Barriers:  [name] Knowledge Deficits related to cost and installation of an emergency alert system . Lacks caregiver support.  Nurse Case Manager Clinical Goal(s):  [name] Over the next 30 days, patient will work with embedded BSW to address needs related to resources needed to assist patient with understanding the cost and installation process for an emergency alert system  CCM SW Interventions: Completed 03/28/19  . Collaboration with RN Case Manager who request SW assistance with patient goal . Outbound call placed to the patient to assist with care coordination needs o Patient reports she has already contacted her health plan and signed up for the Supreme package with fall detection and GPS capable for $35.99 per month o Patient plans to receive device over the next 7  days via mail . Goal Met  Patient Self Care Activities:  . Self administers medications as prescribed . Attends all scheduled provider appointments . Calls pharmacy for medication refills . Attends church or other social activities . Performs ADL's independently . Performs IADL's independently . Calls provider office for new concerns or questions  Please see past updates related to this goal by clicking on the "Past Updates" button in the selected goal        Other   . COMPLETED: Assist with linking patient to in network dental clinic       Current Barriers:  . Lack of dental care over the last 8 years . Not established with a local dental provider . Unsure of local in-network benefits to accept payor plan . History of DM II  Social Work Clinical Goal(s):  [name] Over the next 30 days the patient will work with CM team to identify local in-network dental providers in order to access maintenance dental services  CCM SW Interventions: Completed 03/28/19 . Outbound call placed to the patient to assess progression of patient goal . Confirmed receipt of dental providers recommended by Dr. Baird Cancer via [place] message o Patient reports she has outreached an office and is awaiting insurance verification prior to appointment scheduling . Goal Met  Patient Self Care Activities:  . Patient verbalizes understanding of plan to work with CM team to identify local dental providers . Self administers medications as prescribed . Calls pharmacy for medication refills . Performs ADL's independently . Performs IADL's independently  Please see past updates related to this goal by clicking on the "Past Updates" button in the selected goal          Follow Up Plan: No planned SW follow up at this time. The patient will remain active with RN Case Manager.  Daneen Schick, BSW, CDP Social Worker, Certified Dementia Practitioner [place] [facility] [place] [facility] [phone]  Total time spent performing  care coordination and/or care management activities with the patient by phone or face to face = 30 minutes.

## 2019-04-04 ENCOUNTER — Telehealth: Payer: Self-pay

## 2019-04-08 ENCOUNTER — Telehealth: Payer: Self-pay

## 2019-04-12 ENCOUNTER — Ambulatory Visit (INDEPENDENT_AMBULATORY_CARE_PROVIDER_SITE_OTHER): Payer: Medicare HMO | Admitting: Psychiatry

## 2019-04-12 DIAGNOSIS — F4323 Adjustment disorder with mixed anxiety and depressed mood: Secondary | ICD-10-CM

## 2019-04-12 DIAGNOSIS — F66 Other sexual disorders: Secondary | ICD-10-CM

## 2019-04-14 ENCOUNTER — Ambulatory Visit: Payer: Medicare HMO

## 2019-04-19 ENCOUNTER — Ambulatory Visit (INDEPENDENT_AMBULATORY_CARE_PROVIDER_SITE_OTHER): Payer: Medicare HMO | Admitting: Internal Medicine

## 2019-04-19 ENCOUNTER — Other Ambulatory Visit: Payer: Self-pay

## 2019-04-19 ENCOUNTER — Encounter: Payer: Self-pay | Admitting: Internal Medicine

## 2019-04-19 VITALS — BP 122/80 | HR 66 | Temp 98.2°F | Ht 65.0 in | Wt 223.8 lb

## 2019-04-19 DIAGNOSIS — N183 Chronic kidney disease, stage 3 unspecified: Secondary | ICD-10-CM | POA: Diagnosis not present

## 2019-04-19 DIAGNOSIS — I129 Hypertensive chronic kidney disease with stage 1 through stage 4 chronic kidney disease, or unspecified chronic kidney disease: Secondary | ICD-10-CM

## 2019-04-19 DIAGNOSIS — K7469 Other cirrhosis of liver: Secondary | ICD-10-CM

## 2019-04-19 DIAGNOSIS — Z6837 Body mass index (BMI) 37.0-37.9, adult: Secondary | ICD-10-CM

## 2019-04-19 DIAGNOSIS — E1122 Type 2 diabetes mellitus with diabetic chronic kidney disease: Secondary | ICD-10-CM | POA: Diagnosis not present

## 2019-04-19 DIAGNOSIS — C220 Liver cell carcinoma: Secondary | ICD-10-CM

## 2019-04-19 DIAGNOSIS — F419 Anxiety disorder, unspecified: Secondary | ICD-10-CM | POA: Diagnosis not present

## 2019-04-19 NOTE — Patient Instructions (Signed)

## 2019-04-19 NOTE — Progress Notes (Signed)
This visit occurred during the SARS-CoV-2 public health emergency.  Safety protocols were in place, including screening questions prior to the visit, additional usage of staff PPE, and extensive cleaning of exam room while observing appropriate contact time as indicated for disinfecting solutions.  Subjective:     [name] , female    DOB: 1946/02/01 , 74 y.o.   MRN: [id number]   Chief Complaint  Patient presents with  . Diabetes  . Hypertension    HPI  Diabetes She presents for her follow-up diabetic visit. She has type 2 diabetes mellitus. Hypoglycemia symptoms include nervousness/anxiousness. Pertinent negatives for diabetes include no blurred vision and no chest pain. There are no hypoglycemic complications. Risk factors for coronary artery disease include diabetes mellitus, dyslipidemia, hypertension, obesity, sedentary lifestyle and post-menopausal. She is following a diabetic diet. She participates in exercise intermittently. Her home blood glucose trend is fluctuating minimally. Her breakfast blood glucose is taken between 8-9 am. Her breakfast blood glucose range is generally 110-130 mg/dl. An ACE inhibitor/angiotensin II receptor blocker is being taken. Eye exam is current.  Hypertension This is a chronic problem. The current episode started more than 1 year ago. The problem has been gradually improving since onset. The problem is uncontrolled. Pertinent negatives include no blurred vision or chest pain. Risk factors for coronary artery disease include diabetes mellitus, dyslipidemia, post-menopausal state and sedentary lifestyle. Past treatments include angiotensin blockers and beta blockers. The current treatment provides moderate improvement.     Past Medical History:  Diagnosis Date  . Arthritis   . Chronic kidney disease    self reports ckd stage 3   . Diabetes mellitus, type II, insulin dependent ([place])    With neurologic complications. Bilateral lower  extremity peripheral neuropathy  . Dyspnea    with excertion; no issues now since weight loss surgery   . Endometriosis   . Essential hypertension   . GERD (gastroesophageal reflux disease)   . 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 Hypertensive nephropathy 07/05/2018  . Hypothyroidism   . Hypothyroidism 02/06/2018  . Morbid obesity with BMI of 50.0-59.9, adult ([place])   . Scoliosis      Family History  Problem Relation Age of Onset  . Heart attack Mother   . Heart failure Mother   . Stroke Father      Current Outpatient Medications:  .  B Complex-C (B-COMPLEX WITH VITAMIN C) tablet, Take 1 tablet by mouth daily., Disp: , Rfl:  .  carvedilol (COREG CR) 20 MG 24 hr capsule, TAKE 1 CAPSULE DAILY, Disp: 90 capsule, Rfl: 2 .  Cholecalciferol 5000 units TABS, Take 5,000 Units by mouth daily., Disp: , Rfl:  .  Colchicine 0.6 MG CAPS, TAKE 1 CAPSULE DAILY AS    NEEDED, Disp: 90 capsule, Rfl: 1 .  Cyanocobalamin (VITAMIN B-12) 2500 MCG SUBL, Take 2,500 mcg by mouth daily., Disp: , Rfl:  .  diclofenac Sodium (VOLTAREN) 1 % GEL, APPLY 2 GRAMS TO AFFECTED AREA(S) 4 TIMES DAILY AS NEEDED, Disp: 200 g, Rfl: 2 .  gabapentin (NEURONTIN) 300 MG capsule, Take 300 mg by mouth at bedtime. , Disp: , Rfl:  .  insulin degludec (TRESIBA FLEXTOUCH) 100 UNIT/ML SOPN FlexTouch Pen, Inject 0.09 mLs (9 Units total) into the skin daily at 10 pm. (Patient taking differently: Inject 14 Units into the skin daily at 10 pm. ), Disp: 5 pen, Rfl: 2 .  losartan (COZAAR)  50 MG tablet, TAKE 1 TABLET BY MOUTH EVERY DAY (Patient taking differently: 2 times per day), Disp: 90 tablet, Rfl: 1 .  Multiple Vitamins-Minerals (HAIR/SKIN/NAILS/BIOTIN PO), Take 1 tablet by mouth daily., Disp: , Rfl:  .  Multiple Vitamins-Minerals (MULTIVITAMIN WITH MINERALS) tablet, Take 1 tablet by mouth daily. Women's 50+, Disp: , Rfl:  .  naproxen sodium (ANAPROX) 220 MG tablet, Take 440 mg by mouth 2 (two)  times daily as needed (pain). , Disp: , Rfl:  .  OZEMPIC, 1 MG/DOSE, 2 MG/1.5ML SOPN, INJECT INTO SKIN ONCE A WEEK, Disp: 9 pen, Rfl: 1 .  simvastatin (ZOCOR) 10 MG tablet, Take 1 tablet (10 mg total) by mouth daily., Disp: 90 tablet, Rfl: 1 .  SYNTHROID 25 MCG tablet, Take 0.5 tablet (12.5 mcg) by mouth on Mondays through Fridays, then take 1 tablet (25 mcg) by mouth on Saturdays and Sundays., Disp: 90 tablet, Rfl: 1 .  docusate sodium (COLACE) 100 MG capsule, Take 1 capsule (100 mg total) by mouth 2 (two) times daily. (Patient not taking: Reported on 07/05/2018), Disp: 60 capsule, Rfl: 1   Allergies  Allergen Reactions  . Meloxicam Other (See Comments)    Fever; muscle aches; "flu-like" symptoms  . Other     Rose fever and hay fever      Review of Systems  Constitutional: Negative.   Eyes: Negative for blurred vision.  Respiratory: Negative.   Cardiovascular: Negative.  Negative for chest pain.  Gastrointestinal: Negative.   Neurological: Negative.   Psychiatric/Behavioral: The patient is nervous/anxious.        She reports feeling more anxious recently. She is ready to move to a new place. She is currently living in an assisted living facility. She reports there are too many "rules". She would like to live more independently. She is not allowed to have visitors, and this bothers her. She is hoping that an apt will become vacant for her in the near future.      Today's Vitals   04/19/19 1127  BP: 122/80  Pulse: 66  Temp: 98.2 F (36.8 C)  TempSrc: Oral  Weight: 223 lb 12.8 oz (101.5 kg)  Height: 5' 5" (1.651 m)  PainSc: 0-No pain   Body mass index is 37.24 kg/m.   Wt Readings from Last 3 Encounters:  04/19/19 223 lb 12.8 oz (101.5 kg)  02/08/19 226 lb 12.8 oz (102.9 kg)  01/17/19 221 lb 12.8 oz (100.6 kg)     Objective:  Physical Exam Vitals and nursing note reviewed.  Constitutional:      Appearance: Normal appearance. She is obese.  HENT:     Head: Normocephalic  and atraumatic.  Cardiovascular:     Rate and Rhythm: Normal rate and regular rhythm.     Heart sounds: Normal heart sounds.  Pulmonary:     Effort: Pulmonary effort is normal.     Breath sounds: Normal breath sounds.  Musculoskeletal:     Comments: Ambulatory with rolling walker.   Skin:    General: Skin is warm.  Neurological:     General: No focal deficit present.     Mental Status: She is alert.  Psychiatric:        Mood and Affect: Mood normal.        Behavior: Behavior normal.         Assessment And Plan:     1. Diabetes mellitus with stage 3 chronic kidney disease (HCC)  Chronic, fair control. She is encouraged to incorporate more  exercise into her daily routine. My goal is to eventually wean her off of insulin. Pt reminded that she must resume the lifestyle changes she made shortly after gastric sleeve surgery to help her eventually wean off of insulin.   - Hemoglobin A1c - BMP8+EGFR  2. Hypertensive nephropathy  Chronic, well controlled. She will continue with current meds. She is encouraged to avoid adding salt to her foods.   3. Anxiety  She does not wish to start medication at this time. She does wish to start therapy. She may also benefit from magnesium supplementation.   4. Other cirrhosis of liver (HCC)  Chronic. I will refer her to Hepatology for further evaluation. Most recent abdominal u/s reviewed during her visit.   - Amb Referral to Hepatology  5. Hepatoma ([place])  Please see #4.   - Amb Referral to Hepatology  6. Class 2 severe obesity due to excess calories with serious comorbidity and body mass index (BMI) of 37.0 to 37.9 in adult [facility])  She is s/p bariatric surgery July 2018. She is encouraged to incorporate more exercise into her daily routine. Encouraged to perform chair exercises while watching TV.     Maximino Greenland, MD    THE PATIENT IS ENCOURAGED TO PRACTICE SOCIAL DISTANCING DUE TO THE COVID-19 PANDEMIC.

## 2019-04-20 LAB — BMP8+EGFR
BUN/Creatinine Ratio: 14 (ref 12–28)
BUN: 15 mg/dL (ref 8–27)
CO2: 23 mmol/L (ref 20–29)
Calcium: 9.4 mg/dL (ref 8.7–10.3)
Chloride: 106 mmol/L (ref 96–106)
Creatinine, Ser: 1.09 mg/dL — ABNORMAL HIGH (ref 0.57–1.00)
GFR calc Af Amer: 58 mL/min/{1.73_m2} — ABNORMAL LOW (ref >59)
GFR calc non Af Amer: 50 mL/min/{1.73_m2} — ABNORMAL LOW (ref >59)
Glucose: 100 mg/dL — ABNORMAL HIGH (ref 65–99)
Potassium: 4.1 mmol/L (ref 3.5–5.2)
Sodium: 144 mmol/L (ref 134–144)

## 2019-04-20 LAB — HEMOGLOBIN A1C
Est. average glucose Bld gHb Est-mCnc: 134 mg/dL
Hgb A1c MFr Bld: 6.3 % — ABNORMAL HIGH (ref 4.8–5.6)

## 2019-04-21 ENCOUNTER — Ambulatory Visit (INDEPENDENT_AMBULATORY_CARE_PROVIDER_SITE_OTHER): Payer: Medicare HMO | Admitting: Psychiatry

## 2019-04-21 DIAGNOSIS — F4323 Adjustment disorder with mixed anxiety and depressed mood: Secondary | ICD-10-CM | POA: Diagnosis not present

## 2019-04-25 ENCOUNTER — Telehealth: Payer: Self-pay | Admitting: Pharmacist

## 2019-04-28 ENCOUNTER — Ambulatory Visit (INDEPENDENT_AMBULATORY_CARE_PROVIDER_SITE_OTHER): Payer: Medicare HMO | Admitting: Psychiatry

## 2019-04-28 DIAGNOSIS — F4323 Adjustment disorder with mixed anxiety and depressed mood: Secondary | ICD-10-CM

## 2019-05-02 ENCOUNTER — Ambulatory Visit: Payer: Self-pay | Admitting: Pharmacist

## 2019-05-02 DIAGNOSIS — E1122 Type 2 diabetes mellitus with diabetic chronic kidney disease: Secondary | ICD-10-CM

## 2019-05-02 DIAGNOSIS — N183 Type 2 diabetes mellitus with diabetic chronic kidney disease: Secondary | ICD-10-CM

## 2019-05-02 NOTE — Progress Notes (Signed)
  Chronic Care Management   Visit Note  05/02/2019 Name: Teresa Golden MRN: [id number] DOB: 08-29-1945  Referred by: Glendale Chard, MD Reason for referral : Chronic Care Management   An unsuccessful telephone outreach was attempted today. The patient was referred to the case management team for assistance with care management and care coordination.   Follow Up Plan: A HIPPA compliant phone message was left for the patient providing contact information and requesting a return call.  The care management team will reach out to the patient again over the next 7-10 days.     Provider Signature Regina Eck, PharmD, BCPS Clinical Pharmacist, [facility] [place]: [phone]

## 2019-05-04 ENCOUNTER — Other Ambulatory Visit: Payer: Self-pay

## 2019-05-04 MED ORDER — LOSARTAN POTASSIUM 50 MG PO TABS: ORAL_TABLET | ORAL | 1 refills | Status: DC

## 2019-05-19 ENCOUNTER — Ambulatory Visit (INDEPENDENT_AMBULATORY_CARE_PROVIDER_SITE_OTHER): Payer: Medicare HMO | Admitting: Psychiatry

## 2019-05-19 DIAGNOSIS — F4323 Adjustment disorder with mixed anxiety and depressed mood: Secondary | ICD-10-CM

## 2019-05-20 ENCOUNTER — Telehealth: Payer: Self-pay

## 2019-05-26 ENCOUNTER — Ambulatory Visit (INDEPENDENT_AMBULATORY_CARE_PROVIDER_SITE_OTHER): Payer: Medicare HMO | Admitting: Psychology

## 2019-05-26 DIAGNOSIS — F4323 Adjustment disorder with mixed anxiety and depressed mood: Secondary | ICD-10-CM

## 2019-06-06 ENCOUNTER — Other Ambulatory Visit: Payer: Self-pay

## 2019-06-06 ENCOUNTER — Ambulatory Visit
Admission: RE | Admit: 2019-06-06 | Discharge: 2019-06-06 | Disposition: A | Discharge status: Home / Self Care | Payer: Medicare HMO | Source: Ambulatory Visit | Attending: Internal Medicine | Admitting: Internal Medicine

## 2019-06-06 DIAGNOSIS — Z1231 Encounter for screening mammogram for malignant neoplasm of breast: Secondary | ICD-10-CM

## 2019-06-09 ENCOUNTER — Ambulatory Visit (INDEPENDENT_AMBULATORY_CARE_PROVIDER_SITE_OTHER): Payer: Medicare HMO | Admitting: Psychology

## 2019-06-09 DIAGNOSIS — F4323 Adjustment disorder with mixed anxiety and depressed mood: Secondary | ICD-10-CM | POA: Diagnosis not present

## 2019-06-21 ENCOUNTER — Other Ambulatory Visit: Payer: Self-pay | Admitting: Nurse Practitioner

## 2019-06-22 ENCOUNTER — Telehealth: Payer: Self-pay

## 2019-06-22 ENCOUNTER — Other Ambulatory Visit: Payer: Self-pay

## 2019-06-22 ENCOUNTER — Ambulatory Visit (INDEPENDENT_AMBULATORY_CARE_PROVIDER_SITE_OTHER): Payer: Medicare HMO

## 2019-06-22 DIAGNOSIS — E1122 Type 2 diabetes mellitus with diabetic chronic kidney disease: Secondary | ICD-10-CM

## 2019-06-22 DIAGNOSIS — N183 Chronic kidney disease, stage 3 unspecified: Secondary | ICD-10-CM

## 2019-06-22 DIAGNOSIS — I129 Hypertensive chronic kidney disease with stage 1 through stage 4 chronic kidney disease, or unspecified chronic kidney disease: Secondary | ICD-10-CM

## 2019-06-22 DIAGNOSIS — Z6837 Body mass index (BMI) 37.0-37.9, adult: Secondary | ICD-10-CM

## 2019-06-23 ENCOUNTER — Ambulatory Visit (INDEPENDENT_AMBULATORY_CARE_PROVIDER_SITE_OTHER): Payer: Medicare HMO | Admitting: Psychology

## 2019-06-23 DIAGNOSIS — F4323 Adjustment disorder with mixed anxiety and depressed mood: Secondary | ICD-10-CM

## 2019-06-24 NOTE — Patient Instructions (Signed)
Visit Information  Goals Addressed      Patient Stated   . "I am having trouble managing my blood sugar" (pt-stated)       Current Barriers:  [name] Knowledge Deficits related to Diabetes Mellitus disease process and Self Health Management  . Chronic Disease Management support and education needs related to DMII, CKDIII, HTN, Class 2 Severe Obesity  Clinical Goal(s):  . 03/01/19 New - Over the next 90 days, patient will work with CCM RN Case Manager to address needs related to disease education and support for Diabetes disease management and weight loss management  CCM RN CM Interventions: 06/22/19 call completed with patient . Evaluation of current treatment plan related to Diabetes Mellitus and patient's adherence to plan as established by provider. . Provided education to patient re: current A1C of 6.3 obtained on 04/19/19; discussed patient's target A1C goal is to achieve <5.6; education reinforced on ways to achieve this goal including adherence to following a diabetic friendly diet, medication adherence and implementing daily exercise; determined patient has resumed her water aerobics at the [facility] as she continues to take COVID precautions with mask wearing and social distancing . Reviewed medications with patient and discussed patient is adhering to her prescribed regimen, Tresiba 14 u sq daily; Ozempic 2 mg/1.7m sq weekly  . Discussed plans with patient for ongoing care management follow up and provided patient with direct contact information for care management team . Advised patient, providing education and rationale, to check cbg 1-2 daily before meals and record, calling the CCM team and or PCP for findings outside established parameters.    Patient Self Care Activities:  . Self administers medications as prescribed . Attends all scheduled provider appointments . Performs ADL's independently  Please see past updates related to this goal by clicking on the "Past Updates" button in the  selected goal      . COMPLETED: "To better understand how to space out my COVID vaccines with my Shingles vaccine" (pt-stated)       Current Barriers:  .Marland KitchenKnowledge Deficits related to vaccine schedule for COVID and Shingles vaccine . Chronic Disease Management support and education needs related to DMII, CKDIII, HTN, Class 2 Severe Obesity  Nurse Case Manager Clinical Goal(s):  .Marland KitchenOver the next 30 days, patient will verbalize understanding of plan for receiving her 1st COVID vaccine injection  CCM RN CM Interventions:  06/22/19 call completed with patient . Inbound call received from Ms. Chuba with questions concerning the COVID vaccine . Determined patient completed her COVID vaccinations w/o complications . Discussed importance of ongong COVID precautions, wearing mask, staying 6 ft apart and washing hands  . Discussed plans with patient for ongoing care management follow up and provided patient with direct contact information for care management team  Patient Self Care Activities:  . Self administers medications as prescribed . Attends all scheduled provider appointments . Calls pharmacy for medication refills . Performs ADL's independently . Performs IADL's independently . Calls provider office for new concerns or questions  Please see past updates related to this goal by clicking on the "Past Updates" button in the selected goal       Other   . COMPLETED: I want to continue to optimize medication management of my chronic conditons       Current Barriers:  . Non Adherence to prescribed medication regimen . Financial Barriers  Pharmacist Clinical Goal(s):  .Marland KitchenOver the next 90 days, patient will work with CCM team & PCP to address  needs related to optimized medication management of chronic conditions.  Interventions: Call completed on 05/02/2019 . Comprehensive medication review performed. . Advised patient to continue eating an ADA recommended diet.  Reviewed food options for  snacks, etc.   . Patient reports FBGs has not exceeded 130. She denies hypoglycemia.  She continues on Tresiba 14 units at bedtime (was previously taking 7-10 units).  She reports she has had to increase her insulin due to "higher" blood sugars.  She explains that her diet and activity is not as it was pre-COVID.  She continues to display self awareness regarding her diabetes. She is motivated to continue her progress.  She is not participating in workout classes (cancelled due to [place]).  She is also working again from home.  . Diabetes regimen optimized at this time with Tresiba 14 units daily and weekly Ozempic 0.5m.  Last A1c was 6.3% on 04/19/19 o Attempted to get patient Freestyle Libre CGM, however insurance has denied--all avenues explored.  Patient does not met criteria.  Cash price is >$120/month for 2 sensors (138-monthupplye) . Antihypertensive regimen: carvedilol, losartan (BP goal <130/80 T2DM) . Antihyperlipidemic regimen:  Patient reports taking statin therapy MWF and reports compliance, however patient has not filled this year per fill data report.  Refill requested from CMA to be called into to [facility] mail order for MWF statin #39 for 90 days.  EMR generated simvastatin 1074maily #90 RX.  Patient filled on 01/09/19--she states she is trying to take daily (filled for #90 days).  Will correct fill for next time.  Also, encouraged patient to continue taking aspirin 45m58m prescribed by MD . Will continue to follow   Patient Self Care Activities:  . Self administers medications as prescribed . Attends all scheduled provider appointments . Calls pharmacy for medication refills  Please see past updates related to this goal by clicking on the "Past Updates" button in the selected goal       Patient verbalizes understanding of instructions provided today.   Telephone follow up appointment with care management team member scheduled for: 07/14/19  AngeBarb Merino, BSN, CCM Care  Management Coordinator [facility]  Direct Phone: [phone]

## 2019-06-24 NOTE — Chronic Care Management (AMB) (Signed)
Chronic Care Management   Follow Up Note   06/22/2019 Name: Teresa Golden MRN: [id number] DOB: 09-11-1945  Referred by: Glendale Chard, MD Reason for referral : Chronic Care Management (FU RN Call )   Teresa Golden is a 74 y.o. year old female who is a primary care patient of Glendale Chard, MD. The CCM team was consulted for assistance with chronic disease management and care coordination needs.    Review of patient status, including review of consultants reports, relevant laboratory and other test results, and collaboration with appropriate care team members and the patient's provider was performed as part of comprehensive patient evaluation and provision of chronic care management services.    SDOH (Social Determinants of Health) assessments performed: Yes See Care Plan activities for detailed interventions related to [place])   Inbound call received from patient. ent    Outpatient Encounter Medications as of 06/22/2019  Medication Sig Note  . B Complex-C (B-COMPLEX WITH VITAMIN C) tablet Take 1 tablet by mouth daily.   . carvedilol (COREG CR) 20 MG 24 hr capsule TAKE 1 CAPSULE DAILY   . Cholecalciferol 5000 units TABS Take 5,000 Units by mouth daily.   . Colchicine 0.6 MG CAPS TAKE 1 CAPSULE DAILY AS    NEEDED   . Cyanocobalamin (VITAMIN B-12) 2500 MCG SUBL Take 2,500 mcg by mouth daily.   . diclofenac Sodium (VOLTAREN) 1 % GEL APPLY 2 GRAMS TO AFFECTED AREA(S) 4 TIMES DAILY AS NEEDED   . docusate sodium (COLACE) 100 MG capsule Take 1 capsule (100 mg total) by mouth 2 (two) times daily. (Patient not taking: Reported on 07/05/2018)   . gabapentin (NEURONTIN) 300 MG capsule Take 300 mg by mouth at bedtime.    . insulin degludec (TRESIBA FLEXTOUCH) 100 UNIT/ML SOPN FlexTouch Pen Inject 0.09 mLs (9 Units total) into the skin daily at 10 pm. (Patient taking differently: Inject 14 Units into the skin daily at 10 pm. )   . losartan (COZAAR) 50 MG tablet Take one tablet twice daily.   .  Multiple Vitamins-Minerals (HAIR/SKIN/NAILS/BIOTIN PO) Take 1 tablet by mouth daily.   . Multiple Vitamins-Minerals (MULTIVITAMIN WITH MINERALS) tablet Take 1 tablet by mouth daily. Women's 50+   . naproxen sodium (ANAPROX) 220 MG tablet Take 440 mg by mouth 2 (two) times daily as needed (pain).    [name] OZEMPIC, 1 MG/DOSE, 2 MG/1.5ML SOPN INJECT INTO SKIN ONCE A WEEK   . simvastatin (ZOCOR) 10 MG tablet Take 1 tablet (10 mg total) by mouth daily. 02/28/2019: Mon-Fri only  . SYNTHROID 25 MCG tablet Take 0.5 tablet (12.5 mcg) by mouth on Mondays through Fridays, then take 1 tablet (25 mcg) by mouth on Saturdays and Sundays.    No facility-administered encounter medications on file as of 06/22/2019.     Objective:  Lab Results  Component Value Date   HGBA1C 6.3 (H) 04/19/2019   HGBA1C 6.4 (H) 01/17/2019   HGBA1C 6.4 (H) 08/31/2018   Lab Results  Component Value Date   MICROALBUR 10 01/17/2019   LDLCALC 82 01/17/2019   CREATININE 1.09 (H) 04/19/2019   BP Readings from Last 3 Encounters:  04/19/19 122/80  02/08/19 140/90  01/17/19 124/80    Goals Addressed      Patient Stated   . "I am having trouble managing my blood sugar" (pt-stated)       Current Barriers:  [name] Knowledge Deficits related to Diabetes Mellitus disease process and Self Health Management  . Chronic Disease Management support  and education needs related to DMII, CKDIII, HTN, Class 2 Severe Obesity  Clinical Goal(s):  . 03/01/19 New - Over the next 90 days, patient will work with CCM RN Case Manager to address needs related to disease education and support for Diabetes disease management and weight loss management  CCM RN CM Interventions: 06/22/19 call completed with patient . Evaluation of current treatment plan related to Diabetes Mellitus and patient's adherence to plan as established by provider. . Provided education to patient re: current A1C of 6.3 obtained on 04/19/19; discussed patient's target A1C goal is to  achieve <5.6; education reinforced on ways to achieve this goal including adherence to following a diabetic friendly diet, medication adherence and implementing daily exercise; determined patient has resumed her water aerobics at the [facility] as she continues to take COVID precautions with mask wearing and social distancing . Reviewed medications with patient and discussed patient is adhering to her prescribed regimen, Tresiba 14 u sq daily; Ozempic 2 mg/1.91m sq weekly  . Discussed plans with patient for ongoing care management follow up and provided patient with direct contact information for care management team . Advised patient, providing education and rationale, to check cbg 1-2 daily before meals and record, calling the CCM team and or PCP for findings outside established parameters.    Patient Self Care Activities:  . Self administers medications as prescribed . Attends all scheduled provider appointments . Performs ADL's independently  Please see past updates related to this goal by clicking on the "Past Updates" button in the selected goal     . COMPLETED: "To better understand how to space out my COVID vaccines with my Shingles vaccine" (pt-stated)       Current Barriers:  .Marland KitchenKnowledge Deficits related to vaccine schedule for COVID and Shingles vaccine . Chronic Disease Management support and education needs related to DMII, CKDIII, HTN, Class 2 Severe Obesity  Nurse Case Manager Clinical Goal(s):  .Marland KitchenOver the next 30 days, patient will verbalize understanding of plan for receiving her 1st COVID vaccine injection  CCM RN CM Interventions:  06/22/19 call completed with patient . Inbound call received from Ms. Lippmann with questions concerning the COVID vaccine . Determined patient completed her COVID vaccinations w/o complications . Discussed importance of ongong COVID precautions, wearing mask, staying 6 ft apart and washing hands  . Discussed plans with patient for ongoing care  management follow up and provided patient with direct contact information for care management team  Patient Self Care Activities:  . Self administers medications as prescribed . Attends all scheduled provider appointments . Calls pharmacy for medication refills . Performs ADL's independently . Performs IADL's independently . Calls provider office for new concerns or questions  Please see past updates related to this goal by clicking on the "Past Updates" button in the selected goal       Other   . COMPLETED: I want to continue to optimize medication management of my chronic conditons       Current Barriers:  . Non Adherence to prescribed medication regimen . Financial Barriers  Pharmacist Clinical Goal(s):  .Marland KitchenOver the next 90 days, patient will work with CCM team & PCP to address needs related to optimized medication management of chronic conditions.  Interventions: Call completed on 05/02/2019 . Comprehensive medication review performed. . Advised patient to continue eating an ADA recommended diet.  Reviewed food options for snacks, etc.   . Patient reports FBGs has not exceeded 130. She denies hypoglycemia.  She  continues on Tresiba 14 units at bedtime (was previously taking 7-10 units).  She reports she has had to increase her insulin due to "higher" blood sugars.  She explains that her diet and activity is not as it was pre-COVID.  She continues to display self awareness regarding her diabetes. She is motivated to continue her progress.  She is not participating in workout classes (cancelled due to [place]).  She is also working again from home.  . Diabetes regimen optimized at this time with Tresiba 14 units daily and weekly Ozempic 0.27m.  Last A1c was 6.3% on 04/19/19 o Attempted to get patient Freestyle Libre CGM, however insurance has denied--all avenues explored.  Patient does not met criteria.  Cash price is >$120/month for 2 sensors (192-monthupplye) . Antihypertensive regimen:  carvedilol, losartan (BP goal <130/80 T2DM) . Antihyperlipidemic regimen:  Patient reports taking statin therapy MWF and reports compliance, however patient has not filled this year per fill data report.  Refill requested from CMA to be called into to [facility] mail order for MWF statin #39 for 90 days.  EMR generated simvastatin 1012maily #90 RX.  Patient filled on 01/09/19--she states she is trying to take daily (filled for #90 days).  Will correct fill for next time.  Also, encouraged patient to continue taking aspirin 44m67m prescribed by MD . Will continue to follow   Patient Self Care Activities:  . Self administers medications as prescribed . Attends all scheduled provider appointments . Calls pharmacy for medication refills  Please see past updates related to this goal by clicking on the "Past Updates" button in the selected goal        Plan:   Telephone follow up appointment with care management team member scheduled for: 07/14/19  AngeBarb Merino, BSN, CCM Care Management Coordinator [facility] [place]  Direct Phone: [phone]

## 2019-06-28 ENCOUNTER — Other Ambulatory Visit: Payer: Self-pay | Admitting: Nurse Practitioner

## 2019-06-28 DIAGNOSIS — K7469 Other cirrhosis of liver: Secondary | ICD-10-CM

## 2019-06-29 ENCOUNTER — Telehealth: Payer: Self-pay

## 2019-06-29 NOTE — Telephone Encounter (Signed)
Pt notified that medication from PAP was here and available for pickup

## 2019-07-02 ENCOUNTER — Other Ambulatory Visit: Payer: Self-pay | Admitting: Internal Medicine

## 2019-07-05 ENCOUNTER — Ambulatory Visit
Admission: RE | Admit: 2019-07-05 | Discharge: 2019-07-05 | Disposition: A | Discharge status: Home / Self Care | Payer: Medicare HMO | Source: Ambulatory Visit | Attending: Nurse Practitioner | Admitting: Nurse Practitioner

## 2019-07-05 DIAGNOSIS — K7469 Other cirrhosis of liver: Secondary | ICD-10-CM

## 2019-07-07 ENCOUNTER — Ambulatory Visit: Payer: Medicare HMO | Admitting: Psychology

## 2019-07-14 ENCOUNTER — Other Ambulatory Visit: Payer: Self-pay

## 2019-07-14 ENCOUNTER — Ambulatory Visit: Payer: Self-pay

## 2019-07-14 ENCOUNTER — Telehealth: Payer: Self-pay

## 2019-07-14 DIAGNOSIS — E1122 Type 2 diabetes mellitus with diabetic chronic kidney disease: Secondary | ICD-10-CM

## 2019-07-14 DIAGNOSIS — Z6837 Body mass index (BMI) 37.0-37.9, adult: Secondary | ICD-10-CM

## 2019-07-14 DIAGNOSIS — I129 Hypertensive chronic kidney disease with stage 1 through stage 4 chronic kidney disease, or unspecified chronic kidney disease: Secondary | ICD-10-CM

## 2019-07-14 DIAGNOSIS — N183 Chronic kidney disease, stage 3 unspecified: Secondary | ICD-10-CM

## 2019-07-15 ENCOUNTER — Ambulatory Visit: Payer: Self-pay

## 2019-07-15 ENCOUNTER — Telehealth: Payer: Self-pay

## 2019-07-15 ENCOUNTER — Other Ambulatory Visit: Payer: Self-pay

## 2019-07-15 DIAGNOSIS — I129 Hypertensive chronic kidney disease with stage 1 through stage 4 chronic kidney disease, or unspecified chronic kidney disease: Secondary | ICD-10-CM

## 2019-07-15 DIAGNOSIS — N183 Chronic kidney disease, stage 3 unspecified: Secondary | ICD-10-CM

## 2019-07-15 DIAGNOSIS — E1122 Type 2 diabetes mellitus with diabetic chronic kidney disease: Secondary | ICD-10-CM

## 2019-07-15 DIAGNOSIS — Z6837 Body mass index (BMI) 37.0-37.9, adult: Secondary | ICD-10-CM

## 2019-07-15 NOTE — Chronic Care Management (AMB) (Addendum)
  Chronic Care Management   Follow Up Note   07/14/2019 Name: Teresa Golden MRN: [id number] DOB: 1945/07/12  Referred by: Glendale Chard, MD Reason for referral : Chronic Care Management (FU RNCM Call )   Teresa Golden is a 74 y.o. year old female who is a primary care patient of Glendale Chard, MD. The CCM team was consulted for assistance with chronic disease management and care coordination needs.    Review of patient status, including review of consultants reports, relevant laboratory and other test results, and collaboration with appropriate care team members and the patient's provider was performed as part of comprehensive patient evaluation and provision of chronic care management services.    Chart Review in preparation to contact patient for CCM RN CM update.   Outpatient Encounter Medications as of 07/14/2019  Medication Sig  . B Complex-C (B-COMPLEX WITH VITAMIN C) tablet Take 1 tablet by mouth daily.  . carvedilol (COREG CR) 20 MG 24 hr capsule TAKE 1 CAPSULE DAILY  . Cholecalciferol 5000 units TABS Take 5,000 Units by mouth daily.  . Colchicine 0.6 MG CAPS TAKE 1 CAPSULE DAILY AS    NEEDED  . Cyanocobalamin (VITAMIN B-12) 2500 MCG SUBL Take 2,500 mcg by mouth daily.  . diclofenac Sodium (VOLTAREN) 1 % GEL APPLY 2 GRAMS TO AFFECTED AREA(S) 4 TIMES DAILY AS NEEDED  . docusate sodium (COLACE) 100 MG capsule Take 1 capsule (100 mg total) by mouth 2 (two) times daily. (Patient not taking: Reported on 07/05/2018)  . gabapentin (NEURONTIN) 300 MG capsule Take 300 mg by mouth at bedtime.   . insulin degludec (TRESIBA FLEXTOUCH) 100 UNIT/ML SOPN FlexTouch Pen Inject 0.09 mLs (9 Units total) into the skin daily at 10 pm. (Patient taking differently: Inject 14 Units into the skin daily at 10 pm. )  . losartan (COZAAR) 50 MG tablet Take one tablet twice daily.  . Multiple Vitamins-Minerals (HAIR/SKIN/NAILS/BIOTIN PO) Take 1 tablet by mouth daily.  . Multiple Vitamins-Minerals (MULTIVITAMIN  WITH MINERALS) tablet Take 1 tablet by mouth daily. Women's 50+  . naproxen sodium (ANAPROX) 220 MG tablet Take 440 mg by mouth 2 (two) times daily as needed (pain).   [name] OZEMPIC, 1 MG/DOSE, 2 MG/1.5ML SOPN INJECT INTO SKIN ONCE A WEEK  . simvastatin (ZOCOR) 10 MG tablet TAKE 1 TABLET DAILY  . SYNTHROID 25 MCG tablet Take 0.5 tablet (12.5 mcg) by mouth on Mondays through Fridays, then take 1 tablet (25 mcg) by mouth on Saturdays and Sundays.   No facility-administered encounter medications on file as of 07/14/2019.     Objective:  Lab Results  Component Value Date   HGBA1C 6.3 (H) 04/19/2019   HGBA1C 6.4 (H) 01/17/2019   HGBA1C 6.4 (H) 08/31/2018   Lab Results  Component Value Date   MICROALBUR 10 01/17/2019   LDLCALC 82 01/17/2019   CREATININE 1.09 (H) 04/19/2019   BP Readings from Last 3 Encounters:  04/19/19 122/80  02/08/19 140/90  01/17/19 124/80     Plan:   Telephone follow up appointment with care management team member scheduled for: 07/18/19  Barb Merino, RN, BSN, CCM Care Management Coordinator [place] [facility]  Direct Phone: [phone]

## 2019-07-18 NOTE — Chronic Care Management (AMB) (Signed)
  Chronic Care Management   Outreach Note  07/18/2019 Name: Teresa Golden MRN: [id number] DOB: [birth year]  Referred by: Glendale Chard, MD Reason for referral : Chronic Care Management (FU RN CM Call )   An unsuccessful telephone outreach was attempted today. The patient was referred to the case management team for assistance with care management and care coordination.   Follow Up Plan: Telephone follow up appointment with care management team member scheduled for: 08/19/19  Barb Merino, RN, BSN, CCM Care Management Coordinator [place] [facility]  Direct Phone: [phone]

## 2019-07-21 ENCOUNTER — Ambulatory Visit (INDEPENDENT_AMBULATORY_CARE_PROVIDER_SITE_OTHER): Payer: Medicare HMO

## 2019-07-21 ENCOUNTER — Ambulatory Visit: Payer: Medicare HMO | Admitting: Psychology

## 2019-07-21 DIAGNOSIS — E1122 Type 2 diabetes mellitus with diabetic chronic kidney disease: Secondary | ICD-10-CM | POA: Diagnosis not present

## 2019-07-21 DIAGNOSIS — I129 Hypertensive chronic kidney disease with stage 1 through stage 4 chronic kidney disease, or unspecified chronic kidney disease: Secondary | ICD-10-CM

## 2019-07-21 DIAGNOSIS — N183 Chronic kidney disease, stage 3 unspecified: Secondary | ICD-10-CM | POA: Diagnosis not present

## 2019-07-21 NOTE — Patient Instructions (Signed)
Social Worker Visit Information  Goals we discussed today:  Goals Addressed            This Visit's Progress     Patient Stated   . "I am having trouble managing my blood sugar" (pt-stated)   On track    Current Barriers:  [name] Knowledge Deficits related to Diabetes Mellitus disease process and Self Health Management  . Chronic Disease Management support and education needs related to DMII, CKDIII, HTN, Class 2 Severe Obesity  Clinical Goal(s):  . 03/01/19 New - Over the next 90 days, patient will work with CCM RN Case Manager to address needs related to disease education and support for Diabetes disease management and weight loss management . New 07/21/19 Over the next 30 days the patient will attend upcomming primary care appointment to assess status of current A1C  CCM SW Interventions: Completed 07/21/19 . Collaboration with RN Care Manager regarding outreach to assess for goal progression . Performed chart review . Successful outbound call placed to the patient . Appropriate assessments performed . Confirmed the patient is checking blood sugar readings BID  o Average reading ranges from 108-117 . Discussed the patient is not following a "diabetic diet" but is practicing portion control . The patient has reduced amount of time exercising due to preparation to move . Confirmed patient knowledge of [place] PCP appointment scheduled for June 24 . Collaboration with RN Care Manager to communicate goal progression o Next telephonic RN outreach planned for 6/25  CCM RN CM Interventions: 06/22/19 call completed with patient . Evaluation of current treatment plan related to Diabetes Mellitus and patient's adherence to plan as established by provider. . Provided education to patient re: current A1C of 6.3 obtained on 04/19/19; discussed patient's target A1C goal is to achieve <5.6; education reinforced on ways to achieve this goal including adherence to following a diabetic friendly diet,  medication adherence and implementing daily exercise; determined patient has resumed her water aerobics at the [facility] as she continues to take COVID precautions with mask wearing and social distancing . Reviewed medications with patient and discussed patient is adhering to her prescribed regimen, Tresiba 14 u sq daily; Ozempic 2 mg/1.44m sq weekly  . Discussed plans with patient for ongoing care management follow up and provided patient with direct contact information for care management team . Advised patient, providing education and rationale, to check cbg 1-2 daily before meals and record, calling the CCM team and or PCP for findings outside established parameters.    Patient Self Care Activities:  . Self administers medications as prescribed . Attends all scheduled provider appointments . Performs ADL's independently  Please see past updates related to this goal by clicking on the "Past Updates" button in the selected goal      . "To keep my BP well managed" (pt-stated)   Not on track    Current Barriers:  .Marland KitchenKnowledge Deficits related to Hypertension disease process and Self Health Management  . Chronic Disease Management support and education needs related to DMII, CKDIII, HTN, Class 2 Severe Obesity   Nurse Case Manager Clinical Goal(s):  .Marland KitchenOver the next 90 days, patient will work with the CCM team to address needs related to disease education and support for HTN  Goal not met . New 07/21/19 Over the next 45 days the patient will work with RN Care Manager to develop an individualized plan of care related to disease education surrounding HTN.  CCM SW Interventions: Completed 07/21/19 . Collaboration with  RN Care Manager regarding outreach to update care plan . Successful outbound call placed to the patient . Appropriate assessments performed . Determined the patient has not been checking blood pressure readings in her home on a regular basis . Discussed importance of in home monitoring  to ensure HTN being well controlled . Collaboration with RN Care Manager regarding goal progression. Next scheduled telephonic outreach with [place] planned for 6/25  CCM RN CM Interventions:  03/01/19 call completed with patient . Evaluation of current treatment plan related to HTN and patient's adherence to plan as established by provider. . Provided education to patient re: target BP of 130/80 or less than . Reviewed medications with patient and discussed patient is adhering to taking her prescribed antihypertensives exactly as directed without missed doses . Discussed plans with patient for ongoing care management follow up and provided patient with direct contact information for care management team . Advised patient, providing education and rationale, to monitor blood pressure daily and record, calling the CCM team and or PCP for findings outside established parameters.  . Provided patient with printed educational materials related to What is High Blood Pressure?; Why Should I Restrict Sodium?; African American's and High Blood Pressure, BP log, Life's Simple 7  Patient Self Care Activities:  . Self administers medications as prescribed . Attends all scheduled provider appointments . Calls pharmacy for medication refills . Performs ADL's independently . Performs IADL's independently . Calls provider office for new concerns or questions  Please see past updates related to this goal by clicking on the "Past Updates" button in the selected goal          Follow Up Plan: RN Care Manager follow up scheduled for 08/19/19.  Daneen Schick, BSW, CDP Social Worker, Certified Dementia Practitioner [place] [facility] [place] [facility] [phone]

## 2019-07-21 NOTE — Chronic Care Management (AMB) (Signed)
Chronic Care Management    Social Work Follow Up Note  07/21/2019 Name: Teresa Golden MRN: [id number] DOB: [birth year]  Teresa Golden is a 74 y.o. year old female who is a primary care patient of Glendale Chard, MD. The CCM team was consulted for assistance with care coordination.   Review of patient status, including review of consultants reports, other relevant assessments, and collaboration with appropriate care team members and the patient's provider was performed as part of comprehensive patient evaluation and provision of chronic care management services.    SDOH (Social Determinants of Health) assessments performed: No    Outpatient Encounter Medications as of 07/21/2019  Medication Sig  . B Complex-C (B-COMPLEX WITH VITAMIN C) tablet Take 1 tablet by mouth daily.  . carvedilol (COREG CR) 20 MG 24 hr capsule TAKE 1 CAPSULE DAILY  . Cholecalciferol 5000 units TABS Take 5,000 Units by mouth daily.  . Colchicine 0.6 MG CAPS TAKE 1 CAPSULE DAILY AS    NEEDED  . Cyanocobalamin (VITAMIN B-12) 2500 MCG SUBL Take 2,500 mcg by mouth daily.  . diclofenac Sodium (VOLTAREN) 1 % GEL APPLY 2 GRAMS TO AFFECTED AREA(S) 4 TIMES DAILY AS NEEDED  . docusate sodium (COLACE) 100 MG capsule Take 1 capsule (100 mg total) by mouth 2 (two) times daily. (Patient not taking: Reported on 07/05/2018)  . gabapentin (NEURONTIN) 300 MG capsule Take 300 mg by mouth at bedtime.   . insulin degludec (TRESIBA FLEXTOUCH) 100 UNIT/ML SOPN FlexTouch Pen Inject 0.09 mLs (9 Units total) into the skin daily at 10 pm. (Patient taking differently: Inject 14 Units into the skin daily at 10 pm. )  . losartan (COZAAR) 50 MG tablet Take one tablet twice daily.  . Multiple Vitamins-Minerals (HAIR/SKIN/NAILS/BIOTIN PO) Take 1 tablet by mouth daily.  . Multiple Vitamins-Minerals (MULTIVITAMIN WITH MINERALS) tablet Take 1 tablet by mouth daily. Women's 50+  . naproxen sodium (ANAPROX) 220 MG tablet Take 440 mg by mouth 2 (two) times  daily as needed (pain).   [name] OZEMPIC, 1 MG/DOSE, 2 MG/1.5ML SOPN INJECT INTO SKIN ONCE A WEEK  . simvastatin (ZOCOR) 10 MG tablet TAKE 1 TABLET DAILY  . SYNTHROID 25 MCG tablet Take 0.5 tablet (12.5 mcg) by mouth on Mondays through Fridays, then take 1 tablet (25 mcg) by mouth on Saturdays and Sundays.   No facility-administered encounter medications on file as of 07/21/2019.     Goals Addressed            This Visit's Progress     Patient Stated   . "I am having trouble managing my blood sugar" (pt-stated)   On track    Current Barriers:  [name] Knowledge Deficits related to Diabetes Mellitus disease process and Self Health Management  . Chronic Disease Management support and education needs related to DMII, CKDIII, HTN, Class 2 Severe Obesity  Clinical Goal(s):  . 03/01/19 New - Over the next 90 days, patient will work with CCM RN Case Manager to address needs related to disease education and support for Diabetes disease management and weight loss management . New 07/21/19 Over the next 30 days the patient will attend upcomming primary care appointment to assess status of current A1C  CCM SW Interventions: Completed 07/21/19 . Collaboration with RN Care Manager regarding outreach to assess for goal progression . Performed chart review . Successful outbound call placed to the patient . Appropriate assessments performed . Confirmed the patient is checking blood sugar readings BID  o Average reading ranges from  108-117 . Discussed the patient is not following a "diabetic diet" but is practicing portion control . The patient has reduced amount of time exercising due to preparation to move . Confirmed patient knowledge of [facility] PCP appointment scheduled for June 24 . Collaboration with RN Care Manager to communicate goal progression o Next telephonic RN outreach planned for 6/25  CCM RN CM Interventions: 06/22/19 call completed with patient . Evaluation of current treatment plan  related to Diabetes Mellitus and patient's adherence to plan as established by provider. . Provided education to patient re: current A1C of 6.3 obtained on 04/19/19; discussed patient's target A1C goal is to achieve <5.6; education reinforced on ways to achieve this goal including adherence to following a diabetic friendly diet, medication adherence and implementing daily exercise; determined patient has resumed her water aerobics at the [facility] as she continues to take COVID precautions with mask wearing and social distancing . Reviewed medications with patient and discussed patient is adhering to her prescribed regimen, Tresiba 14 u sq daily; Ozempic 2 mg/1.49m sq weekly  . Discussed plans with patient for ongoing care management follow up and provided patient with direct contact information for care management team . Advised patient, providing education and rationale, to check cbg 1-2 daily before meals and record, calling the CCM team and or PCP for findings outside established parameters.    Patient Self Care Activities:  . Self administers medications as prescribed . Attends all scheduled provider appointments . Performs ADL's independently  Please see past updates related to this goal by clicking on the "Past Updates" button in the selected goal      . "To keep my BP well managed" (pt-stated)   Not on track    Current Barriers:  .Marland KitchenKnowledge Deficits related to Hypertension disease process and Self Health Management  . Chronic Disease Management support and education needs related to DMII, CKDIII, HTN, Class 2 Severe Obesity   Nurse Case Manager Clinical Goal(s):  .Marland KitchenOver the next 90 days, patient will work with the CCM team to address needs related to disease education and support for HTN  Goal not met . New 07/21/19 Over the next 45 days the patient will work with RN Care Manager to develop an individualized plan of care related to disease education surrounding HTN.  CCM SW  Interventions: Completed 07/21/19 . Collaboration with RN Care Manager regarding outreach to update care plan . Successful outbound call placed to the patient . Appropriate assessments performed . Determined the patient has not been checking blood pressure readings in her home on a regular basis . Discussed importance of in home monitoring to ensure HTN being well controlled . Collaboration with RN Care Manager regarding goal progression. Next scheduled telephonic outreach with [place] for 6[place]25  CCM RN CM Interventions:  03/01/19 call completed with patient . Evaluation of current treatment plan related to HTN and patient's adherence to plan as established by provider. . Provided education to patient re: target BP of 130/80 or less than . Reviewed medications with patient and discussed patient is adhering to taking her prescribed antihypertensives exactly as directed without missed doses . Discussed plans with patient for ongoing care management follow up and provided patient with direct contact information for care management team . Advised patient, providing education and rationale, to monitor blood pressure daily and record, calling the CCM team and or PCP for findings outside established parameters.  . Provided patient with printed educational materials related to What is  High Blood Pressure?; Why Should I Restrict Sodium?; African American's and High Blood Pressure, BP log, Life's Simple 7  Patient Self Care Activities:  . Self administers medications as prescribed . Attends all scheduled provider appointments . Calls pharmacy for medication refills . Performs ADL's independently . Performs IADL's independently . Calls provider office for new concerns or questions  Please see past updates related to this goal by clicking on the "Past Updates" button in the selected goal          Follow Up Plan: Telephonic outreach with [place] planned for  08/19/19.   Daneen Schick, BSW, CDP Social Worker, Certified Dementia Practitioner [place] [facility] [phone]  Total time spent performing care coordination and/or care management activities with the patient by phone or face to face = 52 minutes.

## 2019-07-26 ENCOUNTER — Telehealth: Payer: Self-pay

## 2019-07-26 ENCOUNTER — Ambulatory Visit (INDEPENDENT_AMBULATORY_CARE_PROVIDER_SITE_OTHER): Payer: Medicare HMO

## 2019-07-26 ENCOUNTER — Other Ambulatory Visit: Payer: Self-pay

## 2019-07-26 ENCOUNTER — Ambulatory Visit: Payer: Self-pay

## 2019-07-26 DIAGNOSIS — N183 Chronic kidney disease, stage 3 unspecified: Secondary | ICD-10-CM

## 2019-07-26 DIAGNOSIS — Z6837 Body mass index (BMI) 37.0-37.9, adult: Secondary | ICD-10-CM

## 2019-07-26 DIAGNOSIS — I129 Hypertensive chronic kidney disease with stage 1 through stage 4 chronic kidney disease, or unspecified chronic kidney disease: Secondary | ICD-10-CM

## 2019-07-26 DIAGNOSIS — E1122 Type 2 diabetes mellitus with diabetic chronic kidney disease: Secondary | ICD-10-CM

## 2019-07-26 NOTE — Chronic Care Management (AMB) (Signed)
Chronic Care Management    Social Work Follow Up Note  07/26/2019 Name: Teresa Golden MRN: [id number] DOB: 12-23-1945  Teresa Golden is a 74 y.o. year old female who is a primary care patient of Glendale Chard, MD. The CCM team was consulted for assistance with care coordination.   Review of patient status, including review of consultants reports, other relevant assessments, and collaboration with appropriate care team members and the patient's provider was performed as part of comprehensive patient evaluation and provision of chronic care management services.    SDOH (Social Determinants of Health) assessments performed: No    Outpatient Encounter Medications as of 07/26/2019  Medication Sig  . B Complex-C (B-COMPLEX WITH VITAMIN C) tablet Take 1 tablet by mouth daily.  . carvedilol (COREG CR) 20 MG 24 hr capsule TAKE 1 CAPSULE DAILY  . Cholecalciferol 5000 units TABS Take 5,000 Units by mouth daily.  . Colchicine 0.6 MG CAPS TAKE 1 CAPSULE DAILY AS    NEEDED  . Cyanocobalamin (VITAMIN B-12) 2500 MCG SUBL Take 2,500 mcg by mouth daily.  . diclofenac Sodium (VOLTAREN) 1 % GEL APPLY 2 GRAMS TO AFFECTED AREA(S) 4 TIMES DAILY AS NEEDED  . docusate sodium (COLACE) 100 MG capsule Take 1 capsule (100 mg total) by mouth 2 (two) times daily. (Patient not taking: Reported on 07/05/2018)  . gabapentin (NEURONTIN) 300 MG capsule Take 300 mg by mouth at bedtime.   . insulin degludec (TRESIBA FLEXTOUCH) 100 UNIT/ML SOPN FlexTouch Pen Inject 0.09 mLs (9 Units total) into the skin daily at 10 pm. (Patient taking differently: Inject 14 Units into the skin daily at 10 pm. )  . losartan (COZAAR) 50 MG tablet Take one tablet twice daily.  . Multiple Vitamins-Minerals (HAIR/SKIN/NAILS/BIOTIN PO) Take 1 tablet by mouth daily.  . Multiple Vitamins-Minerals (MULTIVITAMIN WITH MINERALS) tablet Take 1 tablet by mouth daily. Women's 50+  . naproxen sodium (ANAPROX) 220 MG tablet Take 440 mg by mouth 2 (two) times daily  as needed (pain).   [name] OZEMPIC, 1 MG/DOSE, 2 MG/1.5ML SOPN INJECT INTO SKIN ONCE A WEEK  . simvastatin (ZOCOR) 10 MG tablet TAKE 1 TABLET DAILY  . SYNTHROID 25 MCG tablet Take 0.5 tablet (12.5 mcg) by mouth on Mondays through Fridays, then take 1 tablet (25 mcg) by mouth on Saturdays and Sundays.   No facility-administered encounter medications on file as of 07/26/2019.     Goals Addressed            This Visit's Progress     Patient Stated   . "I am having trouble managing my blood sugar" (pt-stated)       Current Barriers:  [name] Knowledge Deficits related to Diabetes Mellitus disease process and Self Health Management  . Chronic Disease Management support and education needs related to DMII, CKDIII, HTN, Class 2 Severe Obesity  Clinical Goal(s):  . 03/01/19 New - Over the next 90 days, patient will work with CCM RN Case Manager to address needs related to disease education and support for Diabetes disease management and weight loss management . New 07/21/19 Over the next 30 days the patient will attend upcomming primary care appointment to assess status of current A1C . New 07/26/19 Over the next 60 days the patient will engage with embedded PharmD to develop an individualized plan of care related to medication management to optimize management of diabetes  CCM SW Interventions: Completed 07/26/19 . Successful outbound call placed to the patient in response to voice message requesting return  call . Discussed the patient would like contact information to [place] stating "my blood sugar keeps coming up lower and I wanted to check on lowering my Tresiba dose" . Provided the patient with contact information to [place], [place] . Advised the patient SW would also place referral to embedded PharmD to outreach the patient and discuss medication management  . Collaboration with RN Care Manager to advise of plan to involve PharmD   Completed 07/21/19 . Collaboration with RN Care  Manager regarding outreach to assess for goal progression . Performed chart review . Successful outbound call placed to the patient . Appropriate assessments performed . Confirmed the patient is checking blood sugar readings BID  o Average reading ranges from 108-117 . Discussed the patient is not following a "diabetic diet" but is practicing portion control . The patient has reduced amount of time exercising due to preparation to move . Confirmed patient knowledge of [place] PCP appointment scheduled for June 24 . Collaboration with RN Care Manager to communicate goal progression o Next telephonic RN outreach planned for 6/25  CCM RN CM Interventions: 06/22/19 call completed with patient . Evaluation of current treatment plan related to Diabetes Mellitus and patient's adherence to plan as established by provider. . Provided education to patient re: current A1C of 6.3 obtained on 04/19/19; discussed patient's target A1C goal is to achieve <5.6; education reinforced on ways to achieve this goal including adherence to following a diabetic friendly diet, medication adherence and implementing daily exercise; determined patient has resumed her water aerobics at the [facility] as she continues to take COVID precautions with mask wearing and social distancing . Reviewed medications with patient and discussed patient is adhering to her prescribed regimen, Tresiba 14 u sq daily; Ozempic 2 mg/1.55ml sq weekly  . Discussed plans with patient for ongoing care management follow up and provided patient with direct contact information for care management team . Advised patient, providing education and rationale, to check cbg 1-2 daily before meals and record, calling the CCM team and or PCP for findings outside established parameters.    Patient Self Care Activities:  . Self administers medications as prescribed . Attends all scheduled provider appointments . Performs ADL's independently  Please see past updates  related to this goal by clicking on the "Past Updates" button in the selected goal          Follow Up Plan: No planned SW follow up at this time. The patient will remain active with RN Care Manager to address care management needs.   Daneen Schick, BSW, CDP Social Worker, Certified Dementia Practitioner [place] [facility] [facility] [phone]  Total time spent performing care coordination and/or care management activities with the patient by phone or face to face = 10 minutes.

## 2019-07-26 NOTE — Patient Instructions (Addendum)
Visit Information  Goals Addressed      Patient Stated   . "I am having trouble managing my blood sugar" (pt-stated)       Current Barriers:  [name] Knowledge Deficits related to Diabetes Mellitus disease process and Self Health Management  . Chronic Disease Management support and education needs related to DMII, CKDIII, HTN, Class 2 Severe Obesity  Clinical Goal(s):  . 03/01/19 New - Over the next 90 days, patient will work with CCM RN Case Manager to address needs related to disease education and support for Diabetes disease management and weight loss management . New 07/21/19 Over the next 30 days the patient will attend upcomming primary care appointment to assess status of current A1C . New 07/26/19 Over the next 60 days the patient will engage with embedded PharmD to develop an individualized plan of care related to medication management to optimize management of diabetes  CCM SW Interventions: Completed 07/26/19 . Successful outbound call placed to the patient in response to voice message requesting return call . Discussed the patient would like contact information to RN Care Manager stating "my blood sugar keeps coming up lower and I wanted to check on lowering my Tresiba dose" . Provided the patient with contact information to [place], [place] . Advised the patient SW would also place referral to embedded PharmD to outreach the patient and discuss medication management  . Collaboration with RN Care Manager to advise of plan to involve PharmD  Completed 07/21/19 . Collaboration with RN Care Manager regarding outreach to assess for goal progression . Performed chart review . Successful outbound call placed to the patient . Appropriate assessments performed . Confirmed the patient is checking blood sugar readings BID  o Average reading ranges from 108-117 . Discussed the patient is not following a "diabetic diet" but is practicing portion control . The patient has reduced  amount of time exercising due to preparation to move . Confirmed patient knowledge of [place] PCP appointment scheduled for June 24 . Collaboration with RN Care Manager to communicate goal progression o Next telephonic RN outreach planned for 6/25  CCM RN CM Interventions: 07/26/19 Inbound call completed with patient . Evaluation of current treatment plan related to Diabetes Mellitus and patient's adherence to plan as established by provider . Discussed patient is experiencing frequent low FBS in the low 80's; she is asking for direction on whether or not she can lower her [place] dosage . Determined patient is currently taking Tresiba 100u/ml, she is taking 14 u sq daily around 10 pm; she is agreeable to having the embedded Pharm D outreach to her for evaluation of her current DM treatment plan and a referral was placed by embedded BSW . Sent in basket message to PCP Dr. Glendale Chard to report patient reported FBS lows, advised Ms. Abdelaziz is requesting to lower her [place] dosage . Instructed patient per Dr. Baird Cancer, she may cut back on her Tresiba to 8 u qd starting this pm and to record her am FBS and record for review by Dr. Baird Cancer, patient agreed she will follow these instructions and will send her am FBS via My Chart on Thursday's  . Instructed patient to contact the CCM team and or PCP for unusually high FBS if needed since cutting Tresiba back, pt verbalizes understanding   . Discussed plans with patient for ongoing care management follow up and provided patient with direct contact information for care management team  Patient Self Care Activities:  . Self  administers medications as prescribed . Attends all scheduled provider appointments . Performs ADL's independently  Please see past updates related to this goal by clicking on the "Past Updates" button in the selected goal      . "I have a consultation for surgery" (pt-stated)       [place] (see longitudinal plan of care  for additional care plan information)  Current Barriers:  [name] Knowledge Deficits related to Diabetes Mellitus disease process and Self Health Management  . Chronic Disease Management support and education needs related to DMII, CKDIII, HTN, Class 2 Severe Obesity  Nurse Case Manager Clinical Goal(s):  [name] Over the next 90 days, patient will verbalize understanding of plan for elective surgery to have a Panniculectomy procedure  CCM RN CM Interventions:  07/26/19 call completed with patient  . Inter-disciplinary care team collaboration (see longitudinal plan of care) . Evaluation of current treatment plan related to elective Panniculectomy procedure and patient's adherence to plan as established by provider . Determined patient will have a consultation with Plastic Surgeon Dr. Audelia Hives, DO to discuss having a Panniculectomy procedure  . Discussed plans with patient for ongoing care management follow up and provided patient with direct contact information for care management team  Patient Self Care Activities:  . Self administers medications as prescribed . Attends all scheduled provider appointments . Calls pharmacy for medication refills . Performs ADL's independently . Performs IADL's independently . Calls provider office for new concerns or questions  Initial goal documentation        Patient verbalizes understanding of instructions provided today.   Telephone follow up appointment with care management team member scheduled for: 08/19/19 Barb Merino, RN, BSN, CCM Care Management Coordinator [facility]  Direct Phone: [phone]

## 2019-07-26 NOTE — Telephone Encounter (Signed)
-----   Message from Glendale Chard, MD sent at 07/26/2019  1:22 PM EDT ----- Regarding: edi Pls call pt to make sure she understands todecrease insulin to 8 units nightly.

## 2019-07-26 NOTE — Patient Instructions (Signed)
Social Worker Visit Information  Goals we discussed today:  Goals Addressed            This Visit's Progress     Patient Stated    "I am having trouble managing my blood sugar" (pt-stated)       Current Barriers:   Knowledge Deficits related to Diabetes Mellitus disease process and Self Health Management   Chronic Disease Management support and education needs related to DMII, CKDIII, HTN, Class 2 Severe Obesity  Clinical Goal(s):   03/01/19 New - Over the next 90 days, patient will work with CCM RN Case Manager to address needs related to disease education and support for Diabetes disease management and weight loss management  New 07/21/19 Over the next 30 days the patient will attend upcomming primary care appointment to assess status of current A1C  New 07/26/19 Over the next 60 days the patient will engage with embedded PharmD to develop an individualized plan of care related to medication management to optimize management of diabetes  CCM SW Interventions: Completed 07/26/19  Successful outbound call placed to the patient in response to voice message requesting return call  Discussed the patient would like contact information to [place] stating "my blood sugar keeps coming up lower and I wanted to check on lowering my Tresiba dose"  Provided the patient with contact information to [place], [place] the patient SW would also place referral to embedded PharmD to outreach the patient and discuss medication management   Collaboration with RN Care Manager to advise of plan to involve PharmD   Completed 07/21/19  Collaboration with RN Care Manager regarding outreach to assess for goal progression  Performed chart review  Successful outbound call placed to the patient  Appropriate assessments performed  Confirmed the patient is checking blood sugar readings BID  o Average reading ranges from 108-117  Discussed the patient is not following a  "diabetic diet" but is practicing portion control  The patient has reduced amount of time exercising due to preparation to move  Confirmed patient knowledge of [place] PCP appointment scheduled for June 24  Collaboration with RN Care Manager to communicate goal progression o Next telephonic RN outreach planned for 6/25  CCM RN CM Interventions: 06/22/19 call completed with patient  Evaluation of current treatment plan related to Diabetes Mellitus and patient's adherence to plan as established by provider.  Provided education to patient re: current A1C of 6.3 obtained on 04/19/19; discussed patient's target A1C goal is to achieve <5.6; education reinforced on ways to achieve this goal including adherence to following a diabetic friendly diet, medication adherence and implementing daily exercise; determined patient has resumed her water aerobics at the [facility] as she continues to take COVID precautions with mask wearing and social distancing  Reviewed medications with patient and discussed patient is adhering to her prescribed regimen, Tresiba 14 u sq daily; Ozempic 2 mg/1.42ml sq weekly   Discussed plans with patient for ongoing care management follow up and provided patient with direct contact information for care management team  Advised patient, providing education and rationale, to check cbg 1-2 daily before meals and record, calling the CCM team and or PCP for findings outside established parameters.    Patient Self Care Activities:   Self administers medications as prescribed  Attends all scheduled provider appointments  Performs ADL's independently  Please see past updates related to this goal by clicking on the "Past Updates" button in the selected goal  Follow Up Plan: No SW follow up planned at this time. Please contact me with future care coordination needs.   Daneen Schick, BSW, CDP Social Worker, Certified Dementia Practitioner [place] [facility] [place]  [facility] [phone]

## 2019-07-26 NOTE — Chronic Care Management (AMB) (Signed)
Chronic Care Management   Follow Up Note   07/26/2019 Name: Teresa Golden MRN: [id number] DOB: February 21, 1946  Referred by: Glendale Chard, MD Reason for referral : Chronic Care Management (FU RN CM Call - Inbound call from patient )   [name] is a 74 y.o. year old female who is a primary care patient of Glendale Chard, MD. The CCM team was consulted for assistance with chronic disease management and care coordination needs.    Review of patient status, including review of consultants reports, relevant laboratory and other test results, and collaboration with appropriate care team members and the patient's provider was performed as part of comprehensive patient evaluation and provision of chronic care management services.    SDOH (Social Determinants of Health) assessments performed: Yes - No Acute Challenges identified at this time  See Care Plan activities for detailed interventions related to [place])    Inbound call received from patient to discuss her current DM treatment plan and low FBS's.   Outpatient Encounter Medications as of 07/26/2019  Medication Sig  . B Complex-C (B-COMPLEX WITH VITAMIN C) tablet Take 1 tablet by mouth daily.  . carvedilol (COREG CR) 20 MG 24 hr capsule TAKE 1 CAPSULE DAILY  . Cholecalciferol 5000 units TABS Take 5,000 Units by mouth daily.  . Colchicine 0.6 MG CAPS TAKE 1 CAPSULE DAILY AS    NEEDED  . Cyanocobalamin (VITAMIN B-12) 2500 MCG SUBL Take 2,500 mcg by mouth daily.  . diclofenac Sodium (VOLTAREN) 1 % GEL APPLY 2 GRAMS TO AFFECTED AREA(S) 4 TIMES DAILY AS NEEDED  . docusate sodium (COLACE) 100 MG capsule Take 1 capsule (100 mg total) by mouth 2 (two) times daily. (Patient not taking: Reported on 07/05/2018)  . gabapentin (NEURONTIN) 300 MG capsule Take 300 mg by mouth at bedtime.   . insulin degludec (TRESIBA FLEXTOUCH) 100 UNIT/ML SOPN FlexTouch Pen Inject 0.09 mLs (9 Units total) into the skin daily at 10 pm. (Patient taking differently: Inject 14  Units into the skin daily at 10 pm. )  . losartan (COZAAR) 50 MG tablet Take one tablet twice daily.  . Multiple Vitamins-Minerals (HAIR/SKIN/NAILS/BIOTIN PO) Take 1 tablet by mouth daily.  . Multiple Vitamins-Minerals (MULTIVITAMIN WITH MINERALS) tablet Take 1 tablet by mouth daily. Women's 50+  . naproxen sodium (ANAPROX) 220 MG tablet Take 440 mg by mouth 2 (two) times daily as needed (pain).   [name] OZEMPIC, 1 MG/DOSE, 2 MG/1.5ML SOPN INJECT INTO SKIN ONCE A WEEK  . simvastatin (ZOCOR) 10 MG tablet TAKE 1 TABLET DAILY  . SYNTHROID 25 MCG tablet Take 0.5 tablet (12.5 mcg) by mouth on Mondays through Fridays, then take 1 tablet (25 mcg) by mouth on Saturdays and Sundays.   No facility-administered encounter medications on file as of 07/26/2019.     Objective:  Lab Results  Component Value Date   HGBA1C 6.3 (H) 04/19/2019   HGBA1C 6.4 (H) 01/17/2019   HGBA1C 6.4 (H) 08/31/2018   Lab Results  Component Value Date   MICROALBUR 10 01/17/2019   LDLCALC 82 01/17/2019   CREATININE 1.09 (H) 04/19/2019   BP Readings from Last 3 Encounters:  04/19/19 122/80  02/08/19 140/90  01/17/19 124/80    Goals Addressed      Patient Stated   . "I am having trouble managing my blood sugar" (pt-stated)       Current Barriers:  [name] Knowledge Deficits related to Diabetes Mellitus disease process and Self Health Management  . Chronic Disease Management support  and education needs related to DMII, CKDIII, HTN, Class 2 Severe Obesity  Clinical Goal(s):  . 03/01/19 New - Over the next 90 days, patient will work with CCM RN Case Manager to address needs related to disease education and support for Diabetes disease management and weight loss management . New 07/21/19 Over the next 30 days the patient will attend upcomming primary care appointment to assess status of current A1C . New 07/26/19 Over the next 60 days the patient will engage with embedded PharmD to develop an individualized plan of care related to  medication management to optimize management of diabetes  CCM SW Interventions: Completed 07/26/19 . Successful outbound call placed to the patient in response to voice message requesting return call . Discussed the patient would like contact information to RN Care Manager stating "my blood sugar keeps coming up lower and I wanted to check on lowering my Tresiba dose" . Provided the patient with contact information to [place], [place] . Advised the patient SW would also place referral to embedded PharmD to outreach the patient and discuss medication management  . Collaboration with RN Care Manager to advise of plan to involve PharmD  Completed 07/21/19 . Collaboration with RN Care Manager regarding outreach to assess for goal progression . Performed chart review . Successful outbound call placed to the patient . Appropriate assessments performed . Confirmed the patient is checking blood sugar readings BID  o Average reading ranges from 108-117 . Discussed the patient is not following a "diabetic diet" but is practicing portion control . The patient has reduced amount of time exercising due to preparation to move . Confirmed patient knowledge of [place] PCP appointment scheduled for June 24 . Collaboration with RN Care Manager to communicate goal progression o Next telephonic RN outreach planned for 6/25  CCM RN CM Interventions: 07/26/19 Inbound call completed with patient . Evaluation of current treatment plan related to Diabetes Mellitus and patient's adherence to plan as established by provider . Discussed patient is experiencing frequent low FBS in the low 80's; she is asking for direction on whether or not she can lower her [place] dosage . Determined patient is currently taking Tresiba 100u/ml, she is taking 14 u sq daily around 10 pm; she is agreeable to having the embedded Pharm D outreach to her for evaluation of her current DM treatment plan and a referral was placed  by embedded BSW . Sent in basket message to PCP Dr. Glendale Chard to report patient reported FBS lows, advised Ms. Barbato is requesting to lower her [place] dosage . Instructed patient per Dr. Baird Cancer, she may cut back on her Tresiba to 8 u qd starting this pm and to record her am FBS and record for review by Dr. Baird Cancer, patient agreed she will follow these instructions and will send her am FBS via My Chart on Thursday's  . Instructed patient to contact the CCM team and or PCP for unusually high FBS if needed since cutting Tresiba back, pt verbalizes understanding   . Discussed plans with patient for ongoing care management follow up and provided patient with direct contact information for care management team  Patient Self Care Activities:  . Self administers medications as prescribed . Attends all scheduled provider appointments . Performs ADL's independently  Please see past updates related to this goal by clicking on the "Past Updates" button in the selected goal      . "I have a consultation for surgery" (pt-stated)  CARE PLAN ENTRY (see longitudinal plan of care for additional care plan information)  Current Barriers:  [name] Knowledge Deficits related to Diabetes Mellitus disease process and Self Health Management  . Chronic Disease Management support and education needs related to DMII, CKDIII, HTN, Class 2 Severe Obesity  Nurse Case Manager Clinical Goal(s):  [name] Over the next 90 days, patient will verbalize understanding of plan for elective surgery to have a Panniculectomy procedure  CCM RN CM Interventions:  07/26/19 call completed with patient  . Inter-disciplinary care team collaboration (see longitudinal plan of care) . Evaluation of current treatment plan related to elective Panniculectomy procedure and patient's adherence to plan as established by provider . Determined patient will have a consultation with Plastic Surgeon Dr. Audelia Hives, DO to discuss having a  Panniculectomy procedure  . Discussed plans with patient for ongoing care management follow up and provided patient with direct contact information for care management team  Patient Self Care Activities:  . Self administers medications as prescribed . Attends all scheduled provider appointments . Calls pharmacy for medication refills . Performs ADL's independently . Performs IADL's independently . Calls provider office for new concerns or questions  Initial goal documentation        Plan:   Telephone follow up appointment with care management team member scheduled for: 08/19/19   Barb Merino, RN, BSN, CCM Care Management Coordinator [place] [facility]  Direct Phone: [phone]

## 2019-07-27 ENCOUNTER — Telehealth: Payer: Self-pay

## 2019-07-27 NOTE — Telephone Encounter (Signed)
The pt was told that Dr Baird Cancer wanted to make sure that the pt understands to decrease her insulin to 8 units nightly and the pt said yes she does.

## 2019-07-29 ENCOUNTER — Telehealth: Payer: Self-pay | Admitting: Internal Medicine

## 2019-07-29 NOTE — Chronic Care Management (AMB) (Signed)
  Chronic Care Management   Note  07/29/2019 Name: Teresa Golden MRN: [id number] DOB: 1945-11-30  Teresa Golden is a 74 y.o. year old female who is a primary care patient of Glendale Chard, MD. I reached out to [name] by phone today in response to a referral sent by Teresa Golden's PCP, Glendale Chard, MD     Teresa Golden was given information about Chronic Care Management services today including:  1. CCM service includes personalized support from designated clinical staff supervised by her physician, including individualized plan of care and coordination with other care providers 2. 24/7 contact phone numbers for assistance for urgent and routine care needs. 3. Service will only be billed when office clinical staff spend 20 minutes or more in a month to coordinate care. 4. Only one practitioner may furnish and bill the service in a calendar month. 5. The patient may stop CCM services at any time (effective at the end of the month) by phone call to the office staff. 6. The patient will be responsible for cost sharing (co-pay) of up to 20% of the service fee (after annual deductible is met).  Patient agreed to services and verbal consent obtained.   Follow up plan: Telephone appointment with care management team member scheduled for:08/01/2019  [place], [place], [place] [address] Direct Dial: [email or web address] Website: [email or web address]

## 2019-07-31 NOTE — Chronic Care Management (AMB) (Signed)
Chronic Care Management Pharmacy  Name: Teresa Golden  MRN: [id number] DOB: August 10, 1945  Chief Complaint/ HPI  Teresa Golden,  74 y.o. , female presents for their Initial CCM visit with the clinical pharmacist via telephone due to COVID-19 Pandemic.  PCP : Glendale Chard, MD  Their chronic conditions include: Type 2 Diabetes mellitus, Hypertension, Hypothyroidism, CKD, Osteoarthritis  Office Visits: 07/27/19 Telephone call: [name] decreased to 8 units nightly  04/19/19 OV: Presented for DM and HTN follow up visit. Pt reported more anxiousness lately due to current living situation. She lives in an assisted living facility and stated that it has too many rules and she cannot have visitors, she is looking for a new apartment. DM chronic, fair control. Plan to wean off of insulin eventually. Labs ordered (HgbA1c, BMP8+EGFR). Pt denied prescription treatment for anxiety but would like to start therapy. She may also benefit from Mg supplementation. Referred to Hepatology for further evaluation of cirrhosis of the liver and hepatoma.  02/08/19 AWV  Consult Visit:  CCM Encounters: 07/26/19 Sw and RN: Discussed pt current diabetes treatment regimen [name] 14 units daily). Pt reported low BG readings (low 80s) and would like to decrease [place] if possible. Average BG readings 108-117. Referred to pharmacy for diabetes management. Collaborated with PCP regarding FBG readings and Tresiba dosing, will decrease to 8 units daily.  06/22/19 RN: Evaluated diabetes treatment plan and pt goals. Provided pt education regarding diet, exercise, and medication adherence. Advised pt to check BG 1-2 times daily before meals and record.   03/29/19: Ozempic approved via [employer] through 02/24/20.  03/28/19 SW: Addressed care coordination needs regard emergency alert system and arranging ental care  03/21/19 RN: Discussed timing of COVID and Shingrix vaccines  03/18/19 SW: Care coordination and collaboration  regarding dental appointment. Completed Ozempic PAP finished and faxed to [employer].   1/11/2 PharmD: Pt reported FBGs have not exceeded 130. She increased Tresiba to 14 units daily due to higher blood sugars (she had been using 7-10 units nightly). Attempting to get FreeStyle Libre covered on insurance, but unlikely due to current guidelines. Pt reported taking statin M/W/F. Stated she is trying to take daily now. Encourage pt to continue aspirin 81m daily.   03/04/19 RN: Discussed proper timing of Shingrix vaccine and COVID vaccines. Instructed to get second shingles vaccine as planned (2/15) and then wait 2 weeks until receiving COVID vaccines  02/28/19 PharmD: Comprehensive medication review performed.   Medications: Outpatient Encounter Medications as of 08/01/2019  Medication Sig  . B Complex-C (B-COMPLEX WITH VITAMIN C) tablet Take 1 tablet by mouth daily.  . Biotin 10000 MCG TABS Take 1 tablet by mouth daily.  . carvedilol (COREG CR) 20 MG 24 hr capsule TAKE 1 CAPSULE DAILY  . Cholecalciferol 5000 units TABS Take 5,000 Units by mouth daily.  . Colchicine 0.6 MG CAPS TAKE 1 CAPSULE DAILY AS    NEEDED  . Cyanocobalamin (VITAMIN B-12) 2500 MCG SUBL Take 2,500 mcg by mouth daily.  . diclofenac Sodium (VOLTAREN) 1 % GEL APPLY 2 GRAMS TO AFFECTED AREA(S) 4 TIMES DAILY AS NEEDED  . famotidine (PEPCID) 20 MG tablet Take 20 mg by mouth daily. PRN  . gabapentin (NEURONTIN) 300 MG capsule Take 300 mg by mouth at bedtime.   . insulin degludec (TRESIBA FLEXTOUCH) 100 UNIT/ML SOPN FlexTouch Pen Inject 0.09 mLs (9 Units total) into the skin daily at 10 pm. (Patient taking differently: Inject 8 Units into the skin daily at 10 pm. )  .  Multiple Vitamins-Minerals (MULTIVITAMIN WITH MINERALS) tablet Take 1 tablet by mouth daily. Women's 50+  . naproxen sodium (ANAPROX) 220 MG tablet Take 440 mg by mouth 2 (two) times daily as needed (pain). 2 tablets every night at bedtime  . OZEMPIC, 1 MG/DOSE, 2 MG/1.5ML  SOPN INJECT INTO SKIN ONCE A WEEK (Patient taking differently: 13m)  . SYNTHROID 25 MCG tablet Take 0.5 tablet (12.5 mcg) by mouth on Mondays through Fridays, then take 1 tablet (25 mcg) by mouth on Saturdays and Sundays.  . [DISCONTINUED] losartan (COZAAR) 50 MG tablet Take one tablet twice daily. (Patient taking differently: 2 tablets 2 times per day)  . docusate sodium (COLACE) 100 MG capsule Take 1 capsule (100 mg total) by mouth 2 (two) times daily.  . simvastatin (ZOCOR) 10 MG tablet TAKE 1 TABLET DAILY  . [DISCONTINUED] Multiple Vitamins-Minerals (HAIR/SKIN/NAILS/BIOTIN PO) Take 1 tablet by mouth daily.   No facility-administered encounter medications on file as of 08/01/2019.    Current Diagnosis/Assessment:  SDOH Interventions     Most Recent Value  SDOH Interventions  Financial Strain Interventions --  [Will assist with [place] assistance if patient needs]      Goals Addressed            This Visit's Progress   . Pharmacy Care Plan       CARE PLAN ENTRY (see longitudinal plan of care for additional care plan information)  Current Barriers:  . Chronic Disease Management support, education, and care coordination needs related to Hypertension, Diabetes, and Hypothyroidism   Hypertension BP Readings from Last 3 Encounters:  08/18/19 (!) 144/82  08/18/19 (!) 144/82  04/19/19 122/80   . Pharmacist Clinical Goal(s): o Over the next 90 days, patient will work with PharmD and providers to achieve BP goal <130/80 . Current regimen:   Carvedilol CR 257mdaily  Losartan 5065mwice daily . Interventions: o Recommend patient increase exercise to 30 minutes daily 5 times per week . Patient self care activities - Over the next 90 days, patient will: o Check BP periodically and if symptomatic, document, and provide at future appointments o Ensure daily salt intake < 2300 mg/day o Exercise 30 minutes per day, 5 days per week  Hyperlipidemia Lab Results  Component  Value Date/Time   LDLCALC 82 01/17/2019 11:56 AM   . Pharmacist Clinical Goal(s): o Over the next 90 days, patient will work with PharmD and providers to achieve LDL goal < 70 . Current regimen:  o Simvastatin 45m60mily (Monday through Friday) . Interventions: o Recommend lipid panel at next PCP appointment . Patient self care activities - Over the next 90 days, patient will: o Take cholesterol medication as directed o Exercise 30 minutes daily 5 times per week  Diabetes Lab Results  Component Value Date/Time   HGBA1C 6.5 (H) 08/18/2019 02:10 PM   HGBA1C 6.3 (H) 04/19/2019 12:12 PM   . Pharmacist Clinical Goal(s): o Over the next 90 days, patient will work with PharmD and providers to maintain A1c goal <7% . Current regimen:   Tresiba 100 units/mL 8 units daily   Ozempic 1mg 72mkly . Interventions: o Discussed appropriate blood sugar goals (less than 130 if fasting, less than 180 if 2 hours after eating) o Will assist with [place] assistance if needed o Provided dietary and exercise recommendations . Patient self care activities - Over the next 90 days, patient will: o Check blood sugar twice daily, document, and provide at future appointments o Contact provider with  any episodes of hypoglycemia o Exercise 30 minutes daily 5 times per week  Leg Cramps . Pharmacist Clinical Goal(s) o Over the next 90 days, patient will work with PharmD and providers to improve symptoms . Current regimen:  o N/A . Interventions: o Recommend patient try magnesium topical cream applied nightly for leg cramps o Separate from application of Voltaren . Patient self care activities - Over the next 90 days, patient will: o Apply magnesium cream topically at night as needed  Medication management . Pharmacist Clinical Goal(s): o Over the next 90 days, patient will work with PharmD and providers to maintain optimal medication adherence . Current pharmacy[facility] . Interventions o Comprehensive medication review performed. o Continue current medication management strategy . Patient self care activities - Over the next 90 days, patient will: o Focus on medication adherence by utilization of pill box o Take medications as prescribed o Report any questions or concerns to PharmD and/or provider(s)  Initial goal documentation        Diabetes   Recent Relevant Labs: Lab Results  Component Value Date/Time   HGBA1C 6.5 (H) 08/18/2019 02:10 PM   HGBA1C 6.3 (H) 04/19/2019 12:12 PM   MICROALBUR 10 01/17/2019 06:09 PM   MICROALBUR 30 02/04/2018 04:57 PM    Kidney Function Lab Results  Component Value Date/Time   CREATININE 0.83 08/18/2019 02:10 PM   CREATININE 1.09 (H) 04/19/2019 12:12 PM   GFRNONAA 69 08/18/2019 02:10 PM   GFRAA 80 08/18/2019 02:10 PM   Checking BG: 2x per Day   Recent FBG Readings: 96, 94, 152, 104, 138 Recent pre-meal BG readings:  Recent 2hr PP BG readings:   Recent HS BG readings (2-3 hrs after eating): 144, 130, 126, 83, 133 Patient has failed these meds in past: Novolog, Novolog 70/30, Januvia Patient is currently controlled on the following medications:   Tresiba 100 units/mL 8 units daily   Ozempic 25m weekly  Last diabetic Foot exam: 01/17/19 Last diabetic Eye exam: Lab Results  Component Value Date/Time   HMDIABEYEEXA No Retinopathy 05/06/2018 12:00 AM    We discussed:  Diet extensively  Pt says she loves to c[place]inn[place]ix[place] lot of salads and greens/green vegetables  Usually tries to do portion control (has a gastric sleeve)  Indulges sometimes  Eats mostly chicken and fish, some red meat  Does not drink much water (drinks Propel)  Recommended pt increase water intake to 64 oz daily  1 alcoholic drink a week    Exercise extensively  Water aerobics 2- 3 times weekly for 35 minutes  Uses weight machines and stationary bike sometimes  Recommended pt increase to 30 minutes 5 times  weekly  Discussed blood sugar goals (<130 if fasting, <180 if 2 hours after eating)  Pt states that [place] get expensive when she is in the donut hole, otherwise affordable  Pt administers Tresiba around 10pm, but checks BG first  Plan -Continue current medications    Hypertension   Office blood pressures are  BP Readings from Last 3 Encounters:  08/18/19 (!) 144/82  08/18/19 (!) 144/82  04/19/19 122/80   Patient has failed these meds in the past: Atenolol, valsartan Patient is currently controlled on the following medications:   Carvedilol CR 225mdaily  Losartan 5074mwice daily  Patient checks BP at home infrequently  Patient home BP readings are ranging: None to provide  We discussed:  Diet and exercise extensively  Limits salt  Makes her own  seasonings to limit salt  Recommended patient check BP periodically and if symptomatic  Plan -Continue current medications   Hyperlipidemia   Lipid Panel     Component Value Date/Time   CHOL 156 01/17/2019 1156   TRIG 61 01/17/2019 1156   HDL 62 01/17/2019 1156   [place] 82 01/17/2019 1156    The 10-year ASCVD risk score Mikey Bussing DC Jr., et al., 2013) is: 32.7%   Values used to calculate the score:     Age: 49 years     Sex: Female     Is Non-Hispanic African American: Yes     Diabetic: Yes     Tobacco smoker: No     Systolic Blood Pressure: 570 mmHg     Is BP treated: Yes     HDL Cholesterol: 62 mg/dL     Total Cholesterol: 156 mg/dL   Patient has failed these meds in past: N/A Patient is currently uncontrolled on the following medications:   Simvastatin 45m daily (Monday-Friday)  We discussed:   Overall cholesterol is great, but LDL above goal of 70  Plan -Continue current medications  -Recommend recheck lipid panel at next PCP visit. If LDL above 70, recommend increase to simvastatin 274mdaily  Hypothyroidism   Lab Results  Component Value Date/Time   TSH 1.230 07/05/2018 11:17 AM    Patient has failed these meds in past: N/A Patient is currently controlled on the following medications:   Synthroid 2513m1/2 tablet daily Monday through Friday, 1 tablet on Saturday and Sunday   We discussed:   Synthroid dosing schedule  Plan -Continue current medications  Gout   Patient has failed these meds in past: N/A Patient is currently controlled on the following medications:   Colchicine 0.6mg23mpsule daily as needed  We discussed:    No recent gout flares  Plan -Continue current medications  Osteoarthritis   Patient has failed these meds in past: Meloxicam, Tramadol Patient is currently controlled on the following medications:   Diclofenac 1% gel apply 2 grams 4 times daily as needed  Aleve 220mg78mablets every night at bedtime (up to twice daily as needed)  We discussed:    Pt states she walks with a cane, used to use a rollator  Recommend pt use Aleve sparingly due to effects on BP and kidneys  Plan -Continue current medications  Neuropathy/ Leg cramps   Patient has failed these meds in past: N/A Patient is currently controlled on the following medications:   Gabapentin 300mg 45medtime  We discussed:  Pt mentioned intermittent leg cramps at night  Advised pt to try topical magnesium cream for cramps (separate from Voltaren gel)  Plan Continue current medications  Start Magnesium cream nightly for leg cramps Review symptoms at follow up to ensure not statin related  Vaccines   Reviewed and discussed patient's vaccination history.    Immunization History  Administered Date(s) Administered  . Fluad Quad(high Dose 65+) 11/10/2018  . Influenza, High Dose Seasonal PF 12/21/2017, 11/10/2018  . Influenza,inj,quad, With Preservative 02/25/2016  . Influenza-Unspecified 10/25/2016  . Moderna SARS-COVID-2 Vaccination 03/28/2019, 04/25/2019  . Pneumococcal Polysaccharide-23 11/02/2013  . Pneumococcal-Unspecified 11/24/2016  . Zoster  Recombinat (Shingrix) 11/03/2018, 12/31/2018    Plan Discuss at follow up appointment  Medication Management   [facility] Uses pill box? Yes Pt endorses 100% compliance  We discussed:   Pt reports that her medications are not expensive (except for insulin in donut hole)  Importance of taking medications daily as directed  Plan Continue current medication management strategy  Will do cost comparison with [facility], pt is interested in delivery   Follow up: 8 week phone visit  Jannette Fogo, PharmD Clinical Pharmacist [facility] [phone]

## 2019-08-01 ENCOUNTER — Other Ambulatory Visit: Payer: Self-pay

## 2019-08-01 ENCOUNTER — Ambulatory Visit: Payer: Medicare HMO

## 2019-08-01 DIAGNOSIS — N183 Chronic kidney disease, stage 3 unspecified: Secondary | ICD-10-CM

## 2019-08-01 DIAGNOSIS — I129 Hypertensive chronic kidney disease with stage 1 through stage 4 chronic kidney disease, or unspecified chronic kidney disease: Secondary | ICD-10-CM

## 2019-08-01 DIAGNOSIS — E1122 Type 2 diabetes mellitus with diabetic chronic kidney disease: Secondary | ICD-10-CM

## 2019-08-01 DIAGNOSIS — E039 Hypothyroidism, unspecified: Secondary | ICD-10-CM

## 2019-08-15 ENCOUNTER — Telehealth: Payer: Self-pay | Admitting: Internal Medicine

## 2019-08-15 ENCOUNTER — Ambulatory Visit: Payer: Medicare HMO | Admitting: Psychology

## 2019-08-15 NOTE — Telephone Encounter (Signed)
Patient called about her CCM billing her services was not covered by her insurance and since it was not covered there should be no balance to the patient forward the billing information to Janalyn Shy for further review

## 2019-08-16 ENCOUNTER — Ambulatory Visit: Payer: Self-pay

## 2019-08-16 ENCOUNTER — Telehealth: Payer: Self-pay

## 2019-08-16 ENCOUNTER — Other Ambulatory Visit: Payer: Self-pay

## 2019-08-16 DIAGNOSIS — N183 Chronic kidney disease, stage 3 unspecified: Secondary | ICD-10-CM

## 2019-08-16 DIAGNOSIS — I129 Hypertensive chronic kidney disease with stage 1 through stage 4 chronic kidney disease, or unspecified chronic kidney disease: Secondary | ICD-10-CM

## 2019-08-16 DIAGNOSIS — E1122 Type 2 diabetes mellitus with diabetic chronic kidney disease: Secondary | ICD-10-CM

## 2019-08-16 NOTE — Telephone Encounter (Signed)
The pt called Angel with [facility] about her blood pressure being 190/100 and that she was taking Losartan 2 times per day which is too much.   The pt was told that Dr. Baird Cancer wants her to come for a nurse visit to have her blood pressure checked.  The pt said that she was getting ready to get in the bed when I called because she is tried and to tell Dr. Baird Cancer that she has appt on Thursday.

## 2019-08-17 NOTE — Patient Instructions (Signed)
Visit Information  Goals Addressed      Patient Stated   .  "To keep my BP well managed" (pt-stated)        Current Barriers:  [name] Knowledge Deficits related to Hypertension disease process and Self Health Management  . Chronic Disease Management support and education needs related to DMII, CKDIII, HTN, Class 2 Severe Obesity   Nurse Case Manager Clinical Goal(s):  [name] Over the next 90 days, patient will work with the CCM team to address needs related to disease education and support for HTN  Goal not met . New 07/21/19 Over the next 45 days the patient will work with RN Care Manager to develop an individualized plan of care related to disease education surrounding HTN.  CCM RN CM Interventions:  08/16/19 inbound call completed with patient . Received inbound call from patient stating her BP has been elevated for a couple of days . Determined patient called EMS on Sunday due to having a severe headache and elevated BP on her home BP cuff reaching 190/100 . Determined once EMS arrived her BP came down to 170/80's and has remained high since this time . Determined patient has not checked her BP today but decided to double up on her Losartan taking 50 mg, 2 tabs bid instead of how it is prescribed to take 50 mg, 1 tab bid . Determined patient will check her BP and call this RN back with her BP reading once she is able to do so asap, patient states she is in the process of moving and feels she is over doing it . Advised patient it is not safe to double up on her BP medication, discussed having patient come in for further evaluation BP check, she declines wanting to be checked until her scheduled appointment that is set for this week with PCP Dr. Glendale Chard for 08/18/19 '@11' :30 AM . Sent in basket message to Dr. Baird Cancer making her aware of patient reported elevated BP and that she has doubled her dose of Losartan, reply received from Dr. Baird Cancer advising she will contact the patient to provide  recommendations . Advised patient, providing education and rationale, to monitor blood pressure daily and record, calling the CCM team and or PCP for findings outside established parameters . Discussed plans with patient for ongoing care management follow up and provided patient with direct contact information for care management team . Voice message later from patient stating her BP is 176/72, noted PCP office staff contacted Ms. Fisher by telephone per Dr. Baird Cancer   Patient Self Care Activities:  . Self administers medications as prescribed . Attends all scheduled provider appointments . Calls pharmacy for medication refills . Performs ADL's independently . Performs IADL's independently . Calls provider office for new concerns or questions  Please see past updates related to this goal by clicking on the "Past Updates" button in the selected goal        Patient verbalizes understanding of instructions provided today.   Telephone follow up appointment with care management team member scheduled for: 08/19/19  Barb Merino, RN, BSN, CCM Care Management Coordinator [place] [facility]  Direct Phone: [phone]

## 2019-08-17 NOTE — Chronic Care Management (AMB) (Signed)
Chronic Care Management   Follow Up Note   08/16/2019 Name: Teresa Golden MRN: [id number] DOB: 1946/01/08  Referred by: Glendale Chard, MD Reason for referral : Chronic Care Management (FU RN CM Inbound call from patient )   Teresa Golden is a 74 y.o. year old female who is a primary care patient of Glendale Chard, MD. The CCM team was consulted for assistance with chronic disease management and care coordination needs.    Review of patient status, including review of consultants reports, relevant laboratory and other test results, and collaboration with appropriate care team members and the patient's provider was performed as part of comprehensive patient evaluation and provision of chronic care management services.    SDOH (Social Determinants of Health) assessments performed: No See Care Plan activities for detailed interventions related to [place])   Received inbound call from patient concerning her elevated BP.     Outpatient Encounter Medications as of 08/16/2019  Medication Sig  . B Complex-C (B-COMPLEX WITH VITAMIN C) tablet Take 1 tablet by mouth daily.  . Biotin 10000 MCG TABS Take 1 tablet by mouth daily.  . carvedilol (COREG CR) 20 MG 24 hr capsule TAKE 1 CAPSULE DAILY  . Cholecalciferol 5000 units TABS Take 5,000 Units by mouth daily.  . Colchicine 0.6 MG CAPS TAKE 1 CAPSULE DAILY AS    NEEDED  . Cyanocobalamin (VITAMIN B-12) 2500 MCG SUBL Take 2,500 mcg by mouth daily.  . diclofenac Sodium (VOLTAREN) 1 % GEL APPLY 2 GRAMS TO AFFECTED AREA(S) 4 TIMES DAILY AS NEEDED  . docusate sodium (COLACE) 100 MG capsule Take 1 capsule (100 mg total) by mouth 2 (two) times daily. (Patient not taking: Reported on 07/05/2018)  . famotidine (PEPCID) 20 MG tablet Take 20 mg by mouth daily. PRN  . gabapentin (NEURONTIN) 300 MG capsule Take 300 mg by mouth at bedtime.   . insulin degludec (TRESIBA FLEXTOUCH) 100 UNIT/ML SOPN FlexTouch Pen Inject 0.09 mLs (9 Units total) into the skin daily at  10 pm. (Patient taking differently: Inject 8 Units into the skin daily at 10 pm. )  . losartan (COZAAR) 50 MG tablet Take one tablet twice daily.  . Multiple Vitamins-Minerals (HAIR/SKIN/NAILS/BIOTIN PO) Take 1 tablet by mouth daily.  . Multiple Vitamins-Minerals (MULTIVITAMIN WITH MINERALS) tablet Take 1 tablet by mouth daily. Women's 50+  . naproxen sodium (ANAPROX) 220 MG tablet Take 440 mg by mouth 2 (two) times daily as needed (pain). 2 tablets every night at bedtime  . OZEMPIC, 1 MG/DOSE, 2 MG/1.5ML SOPN INJECT INTO SKIN ONCE A WEEK (Patient taking differently: 26m)  . simvastatin (ZOCOR) 10 MG tablet TAKE 1 TABLET DAILY  . SYNTHROID 25 MCG tablet Take 0.5 tablet (12.5 mcg) by mouth on Mondays through Fridays, then take 1 tablet (25 mcg) by mouth on Saturdays and Sundays.   No facility-administered encounter medications on file as of 08/16/2019.     Objective:  Lab Results  Component Value Date   HGBA1C 6.3 (H) 04/19/2019   HGBA1C 6.4 (H) 01/17/2019   HGBA1C 6.4 (H) 08/31/2018   Lab Results  Component Value Date   MICROALBUR 10 01/17/2019   LDLCALC 82 01/17/2019   CREATININE 1.09 (H) 04/19/2019   BP Readings from Last 3 Encounters:  04/19/19 122/80  02/08/19 140/90  01/17/19 124/80    Goals Addressed      Patient Stated   .  "To keep my BP well managed" (pt-stated)        Current Barriers:  .  Knowledge Deficits related to Hypertension disease process and Self Health Management  . Chronic Disease Management support and education needs related to DMII, CKDIII, HTN, Class 2 Severe Obesity   Nurse Case Manager Clinical Goal(s):  [name] Over the next 90 days, patient will work with the CCM team to address needs related to disease education and support for HTN  Goal not met . New 07/21/19 Over the next 45 days the patient will work with RN Care Manager to develop an individualized plan of care related to disease education surrounding HTN.  CCM RN CM Interventions:  08/16/19  inbound call completed with patient . Received inbound call from patient stating her BP has been elevated for a couple of days . Determined patient called EMS on Sunday due to having a severe headache and elevated BP on her home BP cuff reaching 190/100 . Determined once EMS arrived her BP came down to 170/80's and has remained high since this time . Determined patient has not checked her BP today but decided to double up on her Losartan taking 50 mg, 2 tabs bid instead of how it is prescribed to take 50 mg, 1 tab bid . Determined patient will check her BP and call this RN back with her BP reading once she is able to do so asap, patient states she is in the process of moving and feels she is over doing it . Advised patient it is not safe to double up on her BP medication, discussed having patient come in for further evaluation BP check, she declines wanting to be checked until her scheduled appointment that is set for this week with PCP Dr. Glendale Chard for 08/18/19 '@11' :30 AM . Sent in basket message to Dr. Baird Cancer making her aware of patient reported elevated BP and that she has doubled her dose of Losartan, reply received from Dr. Baird Cancer advising she will contact the patient to provide recommendations . Advised patient, providing education and rationale, to monitor blood pressure daily and record, calling the CCM team and or PCP for findings outside established parameters . Discussed plans with patient for ongoing care management follow up and provided patient with direct contact information for care management team . Voice message later from patient stating her BP is 176/72, noted PCP office staff contacted Ms. Chumley by telephone per Dr. Baird Cancer   Patient Self Care Activities:  . Self administers medications as prescribed . Attends all scheduled provider appointments . Calls pharmacy for medication refills . Performs ADL's independently . Performs IADL's independently . Calls provider office  for new concerns or questions  Please see past updates related to this goal by clicking on the "Past Updates" button in the selected goal         Plan:   Telephone follow up appointment with care management team member scheduled for: 08/19/19  Barb Merino, RN, BSN, CCM Care Management Coordinator [facility] [facility]  Direct Phone: [phone]

## 2019-08-18 ENCOUNTER — Ambulatory Visit (INDEPENDENT_AMBULATORY_CARE_PROVIDER_SITE_OTHER): Payer: Medicare HMO

## 2019-08-18 ENCOUNTER — Ambulatory Visit (INDEPENDENT_AMBULATORY_CARE_PROVIDER_SITE_OTHER): Payer: Medicare HMO | Admitting: Internal Medicine

## 2019-08-18 ENCOUNTER — Other Ambulatory Visit: Payer: Self-pay

## 2019-08-18 ENCOUNTER — Encounter: Payer: Self-pay | Admitting: Internal Medicine

## 2019-08-18 VITALS — BP 144/82 | HR 80 | Temp 98.2°F | Ht 65.0 in | Wt 219.0 lb

## 2019-08-18 VITALS — BP 144/82 | HR 80 | Temp 98.2°F | Wt 219.0 lb

## 2019-08-18 DIAGNOSIS — E1122 Type 2 diabetes mellitus with diabetic chronic kidney disease: Secondary | ICD-10-CM | POA: Diagnosis not present

## 2019-08-18 DIAGNOSIS — I129 Hypertensive chronic kidney disease with stage 1 through stage 4 chronic kidney disease, or unspecified chronic kidney disease: Secondary | ICD-10-CM

## 2019-08-18 DIAGNOSIS — N183 Chronic kidney disease, stage 3 unspecified: Secondary | ICD-10-CM

## 2019-08-18 DIAGNOSIS — Z0000 Encounter for general adult medical examination without abnormal findings: Secondary | ICD-10-CM

## 2019-08-18 DIAGNOSIS — Z6836 Body mass index (BMI) 36.0-36.9, adult: Secondary | ICD-10-CM

## 2019-08-18 DIAGNOSIS — E66812 Obesity, class 2: Secondary | ICD-10-CM

## 2019-08-18 DIAGNOSIS — Z113 Encounter for screening for infections with a predominantly sexual mode of transmission: Secondary | ICD-10-CM

## 2019-08-18 MED ORDER — AMLODIPINE BESYLATE 5 MG PO TABS
5.0000 mg | ORAL_TABLET | Freq: Every day | ORAL | 1 refills | Status: DC
Start: 2019-08-18 — End: 2019-08-18

## 2019-08-18 MED ORDER — LOSARTAN POTASSIUM 100 MG PO TABS
100.0000 mg | ORAL_TABLET | Freq: Every day | ORAL | 2 refills | Status: DC
Start: 2019-08-18 — End: 2020-09-17

## 2019-08-18 MED ORDER — AMLODIPINE BESYLATE 5 MG PO TABS: 5.0000 mg | ORAL_TABLET | Freq: Every day | ORAL | 1 refills | Status: DC

## 2019-08-18 NOTE — Patient Instructions (Signed)

## 2019-08-18 NOTE — Patient Instructions (Signed)
Teresa Golden , Thank you for taking time to come for your Medicare Wellness Visit. I appreciate your ongoing commitment to your health goals. Please review the following plan we discussed and let me know if I can assist you in the future.   Screening recommendations/referrals: Colonoscopy: completed 09/13/2012, due 09/14/2022 Mammogram: completed 06/06/2019, due 06/05/2020 Bone Density: completed 04/26/2018 Recommended yearly ophthalmology/optometry visit for glaucoma screening and checkup Recommended yearly dental visit for hygiene and checkup  Vaccinations: Influenza vaccine: completed 11/10/2018, due 09/25/2019 Pneumococcal vaccine: completed 11/24/2016 Tdap vaccine: completed 07/04/2015, due 07/03/2025 Shingles vaccine: discussed   Covid-19:04/25/2019, 03/28/2019  Advanced directives: Please bring a copy of your POA (Power of Attorney) and/or Living Will to your next appointment.    Conditions/risks identified: obesity  Next appointment: 11/24/2019 at 9:30  Follow up in one year for your annual wellness visit    Preventive Care 75 Years and Older, Female Preventive care refers to lifestyle choices and visits with your health care provider that can promote health and wellness. What does preventive care include?  A yearly physical exam. This is also called an annual well check.  Dental exams once or twice a year.  Routine eye exams. Ask your health care provider how often you should have your eyes checked.  Personal lifestyle choices, including:  Daily care of your teeth and gums.  Regular physical activity.  Eating a healthy diet.  Avoiding tobacco and drug use.  Limiting alcohol use.  Practicing safe sex.  Taking low-dose aspirin every day.  Taking vitamin and mineral supplements as recommended by your health care provider. What happens during an annual well check? The services and screenings done by your health care provider during your annual well check will depend on your  age, overall health, lifestyle risk factors, and family history of disease. Counseling  Your health care provider may ask you questions about your:  Alcohol use.  Tobacco use.  Drug use.  Emotional well-being.  Home and relationship well-being.  Sexual activity.  Eating habits.  History of falls.  Memory and ability to understand (cognition).  Work and work Statistician.  Reproductive health. Screening  You may have the following tests or measurements:  Height, weight, and BMI.  Blood pressure.  Lipid and cholesterol levels. These may be checked every 5 years, or more frequently if you are over 75 years old.  Skin check.  Lung cancer screening. You may have this screening every year starting at age 75 if you have a 30-pack-year history of smoking and currently smoke or have quit within the past 15 years.  Fecal occult blood test (FOBT) of the stool. You may have this test every year starting at age 75.  Flexible sigmoidoscopy or colonoscopy. You may have a sigmoidoscopy every 5 years or a colonoscopy every 10 years starting at age 75.  Hepatitis C blood test.  Hepatitis B blood test.  Sexually transmitted disease (STD) testing.  Diabetes screening. This is done by checking your blood sugar (glucose) after you have not eaten for a while (fasting). You may have this done every 1-3 years.  Bone density scan. This is done to screen for osteoporosis. You may have this done starting at age 75.  Mammogram. This may be done every 1-2 years. Talk to your health care provider about how often you should have regular mammograms. Talk with your health care provider about your test results, treatment options, and if necessary, the need for more tests. Vaccines  Your health care provider may  recommend certain vaccines, such as:  Influenza vaccine. This is recommended every year.  Tetanus, diphtheria, and acellular pertussis (Tdap, Td) vaccine. You may need a Td booster  every 10 years.  Zoster vaccine. You may need this after age 55.  Pneumococcal 13-valent conjugate (PCV13) vaccine. One dose is recommended after age 75.  Pneumococcal polysaccharide (PPSV23) vaccine. One dose is recommended after age 40. Talk to your health care provider about which screenings and vaccines you need and how often you need them. This information is not intended to replace advice given to you by your health care provider. Make sure you discuss any questions you have with your health care provider. Document Released: 03/09/2015 Document Revised: 10/31/2015 Document Reviewed: 12/12/2014 Elsevier Interactive Patient Education  2017 [employer] Prevention in the Home Falls can cause injuries. They can happen to people of all ages. There are many things you can do to make your home safe and to help prevent falls. What can I do on the outside of my home?  Regularly fix the edges of walkways and driveways and fix any cracks.  Remove anything that might make you trip as you walk through a door, such as a raised step or threshold.  Trim any bushes or trees on the path to your home.  Use bright outdoor lighting.  Clear any walking paths of anything that might make someone trip, such as rocks or tools.  Regularly check to see if handrails are loose or broken. Make sure that both sides of any steps have handrails.  Any raised decks and porches should have guardrails on the edges.  Have any leaves, snow, or ice cleared regularly.  Use sand or salt on walking paths during winter.  Clean up any spills in your garage right away. This includes oil or grease spills. What can I do in the bathroom?  Use night lights.  Install grab bars by the toilet and in the tub and shower. Do not use towel bars as grab bars.  Use non-skid mats or decals in the tub or shower.  If you need to sit down in the shower, use a plastic, non-slip stool.  Keep the floor dry. Clean up any  water that spills on the floor as soon as it happens.  Remove soap buildup in the tub or shower regularly.  Attach bath mats securely with double-sided non-slip rug tape.  Do not have throw rugs and other things on the floor that can make you trip. What can I do in the bedroom?  Use night lights.  Make sure that you have a light by your bed that is easy to reach.  Do not use any sheets or blankets that are too big for your bed. They should not hang down onto the floor.  Have a firm chair that has side arms. You can use this for support while you get dressed.  Do not have throw rugs and other things on the floor that can make you trip. What can I do in the kitchen?  Clean up any spills right away.  Avoid walking on wet floors.  Keep items that you use a lot in easy-to-reach places.  If you need to reach something above you, use a strong step stool that has a grab bar.  Keep electrical cords out of the way.  Do not use floor polish or wax that makes floors slippery. If you must use wax, use non-skid floor wax.  Do not have throw rugs and  other things on the floor that can make you trip. What can I do with my stairs?  Do not leave any items on the stairs.  Make sure that there are handrails on both sides of the stairs and use them. Fix handrails that are broken or loose. Make sure that handrails are as long as the stairways.  Check any carpeting to make sure that it is firmly attached to the stairs. Fix any carpet that is loose or worn.  Avoid having throw rugs at the top or bottom of the stairs. If you do have throw rugs, attach them to the floor with carpet tape.  Make sure that you have a light switch at the top of the stairs and the bottom of the stairs. If you do not have them, ask someone to add them for you. What else can I do to help prevent falls?  Wear shoes that:  Do not have high heels.  Have rubber bottoms.  Are comfortable and fit you well.  Are closed  at the toe. Do not wear sandals.  If you use a stepladder:  Make sure that it is fully opened. Do not climb a closed stepladder.  Make sure that both sides of the stepladder are locked into place.  Ask someone to hold it for you, if possible.  Clearly mark and make sure that you can see:  Any grab bars or handrails.  First and last steps.  Where the edge of each step is.  Use tools that help you move around (mobility aids) if they are needed. These include:  Canes.  Walkers.  Scooters.  Crutches.  Turn on the lights when you go into a dark area. Replace any light bulbs as soon as they burn out.  Set up your furniture so you have a clear path. Avoid moving your furniture around.  If any of your floors are uneven, fix them.  If there are any pets around you, be aware of where they are.  Review your medicines with your doctor. Some medicines can make you feel dizzy. This can increase your chance of falling. Ask your doctor what other things that you can do to help prevent falls. This information is not intended to replace advice given to you by your health care provider. Make sure you discuss any questions you have with your health care provider. Document Released: 12/07/2008 Document Revised: 07/19/2015 Document Reviewed: 03/17/2014 Elsevier Interactive Patient Education  2017 [employer].

## 2019-08-18 NOTE — Progress Notes (Signed)
This visit occurred during the SARS-CoV-2 public health emergency.  Safety protocols were in place, including screening questions prior to the visit, additional usage of staff PPE, and extensive cleaning of exam room while observing appropriate contact time as indicated for disinfecting solutions.  Subjective:   Teresa Golden is a 74 y.o. female who presents for Medicare Annual (Subsequent) preventive examination.  Review of Systems    n/a Cardiac Risk Factors include: advanced age (>81mn, >>14women);diabetes mellitus;hypertension;obesity (BMI >30kg/m2)     Objective:    Today's Vitals   08/18/19 1202 08/18/19 1210  BP: (!) 144/82   Pulse: 80   Temp: 98.2 F (36.8 C)   TempSrc: Oral   Weight: 219 lb (99.3 kg)   Height: _0  (1.651 m)   PainSc:  6    Body mass index is 36.44 kg/m.  Advanced Directives 08/18/2019 02/08/2019 07/15/2018 02/04/2018 07/30/2017 07/16/2017 08/27/2016  Does Patient Have a Medical Advance Directive? _1  Yes -  Type of AParamedicof ADawnLiving will HE[place]Living will [place]Living will Healthcare Power of Attorney Living will Living will [place] will  Does patient want to make changes to medical advance directive? - - No - Patient declined No - Patient declined No - Patient declined No - Patient declined No - Patient declined  Copy of HUnityin Chart? No - copy requested No - copy requested No - copy requested No - copy requested - - No - copy requested  Would patient like information on creating a medical advance directive? - - - - - - -    Current Medications (verified) Outpatient Encounter Medications as of 08/18/2019  Medication Sig  . B Complex-C (B-COMPLEX WITH VITAMIN C) tablet Take 1 tablet by mouth daily.  . Biotin 10000 MCG TABS Take 1 tablet by mouth daily.  . carvedilol (COREG CR) 20 MG 24 hr capsule TAKE 1  CAPSULE DAILY  . Cholecalciferol 5000 units TABS Take 5,000 Units by mouth daily.  . Colchicine 0.6 MG CAPS TAKE 1 CAPSULE DAILY AS    NEEDED  . Cyanocobalamin (VITAMIN B-12) 2500 MCG SUBL Take 2,500 mcg by mouth daily.  . diclofenac Sodium (VOLTAREN) 1 % GEL APPLY 2 GRAMS TO AFFECTED AREA(S) 4 TIMES DAILY AS NEEDED  . docusate sodium (COLACE) 100 MG capsule Take 1 capsule (100 mg total) by mouth 2 (two) times daily.  . famotidine (PEPCID) 20 MG tablet Take 20 mg by mouth daily. PRN  . gabapentin (NEURONTIN) 300 MG capsule Take 300 mg by mouth at bedtime.   . insulin degludec (TRESIBA FLEXTOUCH) 100 UNIT/ML SOPN FlexTouch Pen Inject 0.09 mLs (9 Units total) into the skin daily at 10 pm. (Patient taking differently: Inject 8 Units into the skin daily at 10 pm. )  . losartan (COZAAR) 100 MG tablet Take 1 tablet (100 mg total) by mouth daily.  . Multiple Vitamins-Minerals (MULTIVITAMIN WITH MINERALS) tablet Take 1 tablet by mouth daily. Women's 50+  . naproxen sodium (ANAPROX) 220 MG tablet Take 440 mg by mouth 2 (two) times daily as needed (pain). 2 tablets every night at bedtime  . OZEMPIC, 1 MG/DOSE, 2 MG/1.5ML SOPN INJECT INTO SKIN ONCE A WEEK (Patient taking differently: 138m  . simvastatin (ZOCOR) 10 MG tablet TAKE 1 TABLET DAILY  . SYNTHROID 25 MCG tablet Take 0.5 tablet (12.5 mcg) by mouth on Mondays through Fridays, then take 1 tablet (25 mcg)  by mouth on Saturdays and Sundays.  . [DISCONTINUED] amLODipine (NORVASC) 5 MG tablet Take 1 tablet (5 mg total) by mouth daily.   No facility-administered encounter medications on file as of 08/18/2019.    Allergies (verified) Meloxicam and Other   History: Past Medical History:  Diagnosis Date  . Arthritis   . Chronic kidney disease    self reports ckd stage 3   . Diabetes mellitus, type II, insulin dependent ([place])    With neurologic complications. Bilateral lower extremity peripheral neuropathy  . Dyspnea    with excertion; no issues now  since weight loss surgery   . Endometriosis   . Essential hypertension   . GERD (gastroesophageal reflux disease)   . 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 Hypertensive nephropathy 07/05/2018  . Hypothyroidism   . Hypothyroidism 02/06/2018  . Morbid obesity with BMI of 50.0-59.9, adult ([place])   . Scoliosis    Past Surgical History:  Procedure Laterality Date  . ABDOMINAL HYSTERECTOMY     uterus  . CHOLECYSTECTOMY    . EYE SURGERY     cataract extraction bilateral   . JOINT REPLACEMENT     Left knee  . KNEE ARTHROPLASTY Right 07/30/2017   Procedure: RIGHT TOTAL KNEE ARTHROPLASTY WITH COMPUTER NAVIGATION;  Surgeon: Rod Can, MD;  Location: [facility];  Service: Orthopedics;  Laterality: Right;  Needs RNFA  . LAPAROSCOPIC GASTRIC BANDING     and reversal  . LAPAROSCOPIC GASTRIC SLEEVE RESECTION N/A 08/26/2016   Procedure: LAPAROSCOPIC GASTRIC SLEEVE RESECTION WITH UPPER ENOD;  Surgeon: Johnathan Hausen, MD;  Location: [facility];  Service: General;  Laterality: N/A;  . TONSILLECTOMY    . TRANSTHORACIC ECHOCARDIOGRAM  11/2013   EF 65-70%. Normal diastolic Fxn.  Normal Valves.   Family History  Problem Relation Age of Onset  . Heart attack Mother   . Heart failure Mother   . Stroke Father    Social History   Socioeconomic History  . Marital status: Single    Spouse name: Not on file  . Number of children: Not on file  . Years of education: Not on file  . Highest education level: Not on file  Occupational History  . Occupation: retired  Tobacco Use  . Smoking status: Former Smoker    Packs/day: 0.25    Years: 10.00    Pack years: 2.50    Types: Cigarettes  . Smokeless tobacco: Never Used  . Tobacco comment: quit 20 years ago  Vaping Use  . Vaping Use: Never used  Substance and Sexual Activity  . Alcohol use: Not Currently    Alcohol/week: 1.0 standard drink    Types: 1 Glasses of wine per week    Comment: occasional  . Drug use:  No  . Sexual activity: Not Currently  Other Topics Concern  . Not on file  Social History Narrative  . Not on file   Social Determinants of Health   Financial Resource Strain: Low Risk   . Difficulty of Paying Living Expenses: Not hard at all  Food Insecurity: No Food Insecurity  . Worried About Charity fundraiser in the Last Year: Never true  . Ran Out of Food in the Last Year: Never true  Transportation Needs: No Transportation Needs  . Lack of Transportation (Medical): No  . Lack of Transportation (Non-Medical): No  Physical Activity: Insufficiently Active  . Days of Exercise per Week: 3 days  .  Minutes of Exercise per Session: 40 min  Stress: No Stress Concern Present  . Feeling of Stress : Not at all  Social Connections:   . Frequency of Communication with Friends and Family:   . Frequency of Social Gatherings with Friends and Family:   . Attends Religious Services:   . Active Member of Clubs or Organizations:   . Attends Archivist Meetings:   [name] Marital Status:     Tobacco Counseling Counseling given: Not Answered Comment: quit 20 years ago   Clinical Intake:  Pre-visit preparation completed: Yes  Pain : 0-10 Pain Score: 6  Pain Type: Acute pain Pain Location: Back Pain Orientation: Upper Pain Descriptors / Indicators: Shooting (soreness) Pain Onset: In the past 7 days Pain Frequency: Intermittent Pain Relieving Factors: rest helps  Pain Relieving Factors: rest helps  Nutritional Status: BMI > 30  Obese Nutritional Risks: None Diabetes: Yes CBG done?: No Did pt. bring in CBG monitor from home?: No  How often do you need to have someone help you when you read instructions, pamphlets, or other written materials from your doctor or pharmacy?: 1 - Never What is the last grade level you completed in school?: college  Diabetic? Yes Nutrition Risk Assessment:  Has the patient had any N/V/D within the last 2 months?  No  Does the patient have  any non-healing wounds?  No  Has the patient had any unintentional weight loss or weight gain?  No   Diabetes:  Is the patient diabetic?  Yes  If diabetic, was a CBG obtained today?  No  Did the patient bring in their glucometer from home?  No  How often do you monitor your CBG's? Twice daily.   Financial Strains and Diabetes Management:  Are you having any financial strains with the device, your supplies or your medication? No .  Does the patient want to be seen by Chronic Care Management for management of their diabetes?  No  Would the patient like to be referred to a Nutritionist or for Diabetic Management?  No   Diabetic Exams:  Diabetic Eye Exam: Overdue for diabetic eye exam. Pt has been advised about the importance in completing this exam. Patient advised to call and schedule an eye exam. Diabetic Foot Exam: Completed going 09/01/2019   Interpreter Needed?: No  Information entered by :: NAllen LPN   Activities of Daily Living In your present state of health, do you have any difficulty performing the following activities: 08/18/2019 02/08/2019  Hearing? N N  Vision? N N  Difficulty concentrating or making decisions? Y N  Comment some from stress of moving -  Walking or climbing stairs? Y Y  Dressing or bathing? N N  Doing errands, shopping? N N  Preparing Food and eating ? N N  Using the Toilet? N N  In the past six months, have you accidently leaked urine? Y Y  Comment ocassionally first thing in the morning  Do you have problems with loss of bowel control? N N  Managing your Medications? N N  Managing your Finances? N N  Housekeeping or managing your Housekeeping? N N  Some recent data might be hidden    Patient Care Team: Glendale Chard, MD as PCP - General (Internal Medicine) Daneen Schick as Social Worker [place], Claudette Stapler, RN as Case Manager [place], Kennieth Francois, [facility] (Pharmacist)  Indicate any recent Medical Services you may have received from other than  [facility] providers in the past year (date may be  approximate).     Assessment:   This is a routine wellness examination for Martiza.  Hearing/Vision screen  Hearing Screening   _0  _1  _2  _3  _4  _5  _6  _7  _8   Right ear:           Left ear:           Vision Screening Comments: Regular eye exams. Dr. Ronnald Ramp, [facility]  Dietary issues and exercise activities discussed: Current Exercise Habits: Home exercise routine, Type of exercise: Other - see comments (water exercise), Time (Minutes): 45, Frequency (Times/Week): 3, Weekly Exercise (Minutes/Week): 135  Goals    .  "I am having trouble managing my blood sugar" (pt-stated)      Current Barriers:  [name] Knowledge Deficits related to Diabetes Mellitus disease process and Self Health Management  . Chronic Disease Management support and education needs related to DMII, CKDIII, HTN, Class 2 Severe Obesity  Clinical Goal(s):  . 03/01/19 New - Over the next 90 days, patient will work with CCM RN Case Manager to address needs related to disease education and support for Diabetes disease management and weight loss management . New 07/21/19 Over the next 30 days the patient will attend upcomming primary care appointment to assess status of current A1C . New 07/26/19 Over the next 60 days the patient will engage with embedded PharmD to develop an individualized plan of care related to medication management to optimize management of diabetes  CCM SW Interventions: Completed 07/26/19 . Successful outbound call placed to the patient in response to voice message requesting return call . Discussed the patient would like contact information to RN Care Manager stating "my blood sugar keeps coming up lower and I wanted to check on lowering my Tresiba dose" . Provided the patient with contact information to [place], Lily . Advised the patient SW would also place referral to embedded PharmD to outreach the patient and discuss  medication management  . Collaboration with RN Care Manager to advise of plan to involve PharmD  Completed 07/21/19 . Collaboration with RN Care Manager regarding outreach to assess for goal progression . Performed chart review . Successful outbound call placed to the patient . Appropriate assessments performed . Confirmed the patient is checking blood sugar readings BID  o Average reading ranges from 108-117 . Discussed the patient is not following a "diabetic diet" but is practicing portion control . The patient has reduced amount of time exercising due to preparation to move . Confirmed patient knowledge of [place] PCP appointment scheduled for June 24 . Collaboration with RN Care Manager to communicate goal progression o Next telephonic RN outreach planned for 6/25  CCM RN CM Interventions: 07/26/19 Inbound call completed with patient . Evaluation of current treatment plan related to Diabetes Mellitus and patient's adherence to plan as established by provider . Discussed patient is experiencing frequent low FBS in the low 80's; she is asking for direction on whether or not she can lower her [place] dosage . Determined patient is currently taking Tresiba 100u/ml, she is taking 14 u sq daily around 10 pm; she is agreeable to having the embedded Pharm D outreach to her for evaluation of her current DM treatment plan and a referral was placed by embedded BSW . Sent in basket message to PCP Dr. Glendale Chard to report patient reported FBS lows, advised Ms. Pollino is requesting to lower her [place] dosage . Instructed patient per Dr. Baird Cancer, she may cut back on her Tresiba to 8 u  qd starting this pm and to record her am FBS and record for review by Dr. Baird Cancer, patient agreed she will follow these instructions and will send her am FBS via My Chart on Thursday's  . Instructed patient to contact the CCM team and or PCP for unusually high FBS if needed since cutting Tresiba back, pt verbalizes  understanding   . Discussed plans with patient for ongoing care management follow up and provided patient with direct contact information for care management team  Patient Self Care Activities:  . Self administers medications as prescribed . Attends all scheduled provider appointments . Performs ADL's independently  Please see past updates related to this goal by clicking on the "Past Updates" button in the selected goal      .  "I have a consultation for surgery" (pt-stated)      [place] (see longitudinal plan of care for additional care plan information)  Current Barriers:  [name] Knowledge Deficits related to Diabetes Mellitus disease process and Self Health Management  . Chronic Disease Management support and education needs related to DMII, CKDIII, HTN, Class 2 Severe Obesity  Nurse Case Manager Clinical Goal(s):  [name] Over the next 90 days, patient will verbalize understanding of plan for elective surgery to have a Panniculectomy procedure  CCM RN CM Interventions:  07/26/19 call completed with patient  . Inter-disciplinary care team collaboration (see longitudinal plan of care) . Evaluation of current treatment plan related to elective Panniculectomy procedure and patient's adherence to plan as established by provider . Determined patient will have a consultation with Plastic Surgeon Dr. Audelia Hives, DO to discuss having a Panniculectomy procedure  . Discussed plans with patient for ongoing care management follow up and provided patient with direct contact information for care management team  Patient Self Care Activities:  . Self administers medications as prescribed . Attends all scheduled provider appointments . Calls pharmacy for medication refills . Performs ADL's independently . Performs IADL's independently . Calls provider office for new concerns or questions  Initial goal documentation     .  "To keep my BP well managed" (pt-stated)      Current  Barriers:  [name] Knowledge Deficits related to Hypertension disease process and Self Health Management  . Chronic Disease Management support and education needs related to DMII, CKDIII, HTN, Class 2 Severe Obesity   Nurse Case Manager Clinical Goal(s):  [name] Over the next 90 days, patient will work with the CCM team to address needs related to disease education and support for HTN  Goal not met . New 07/21/19 Over the next 45 days the patient will work with RN Care Manager to develop an individualized plan of care related to disease education surrounding HTN.  CCM SW Interventions: Completed 07/21/19 . Collaboration with RN Care Manager regarding outreach to update care plan . Successful outbound call placed to the patient . Appropriate assessments performed . Determined the patient has not been checking blood pressure readings in her home on a regular basis . Discussed importance of in home monitoring to ensure HTN being well controlled . Collaboration with RN Care Manager regarding goal progression. Next scheduled telephonic outreach with [place] planned for 6/25  CCM RN CM Interventions:  08/16/19 inbound call completed with patient . Received inbound call from patient stating her BP has been elevated for a couple of days . Determined patient called EMS on Sunday due to having a severe headache and elevated BP on her home BP cuff reaching  190/100 . Determined once EMS arrived her BP came down to 170/80's and has remained high since this time . Determined patient has not checked her BP today but decided to double up on her Losartan taking 50 mg, 2 tabs bid instead of how it is prescribed to take 50 mg, 1 tab bid . Determined patient will check her BP and call this RN back with her BP reading once she is able to do so asap, patient states she is in the process of moving and feels she is over doing it . Advised patient it is not safe to double up on her BP medication, discussed having patient  come in for further evaluation BP check, she declines wanting to be checked until her scheduled appointment that is set for this week with PCP Dr. Glendale Chard for 08/18/19 _0 :30 AM . Sent in basket message to Dr. Baird Cancer making her aware of patient reported elevated BP and that she has doubled her dose of Losartan, reply received from Dr. Baird Cancer advising she will contact the patient to provide recommendations . Advised patient, providing education and rationale, to monitor blood pressure daily and record, calling the CCM team and or PCP for findings outside established parameters . Discussed plans with patient for ongoing care management follow up and provided patient with direct contact information for care management team . Voice message later from patient stating her BP is 176/72, noted PCP office staff contacted Ms. Edell by telephone per Dr. Baird Cancer   Patient Self Care Activities:  . Self administers medications as prescribed . Attends all scheduled provider appointments . Calls pharmacy for medication refills . Performs ADL's independently . Performs IADL's independently . Calls provider office for new concerns or questions  Please see past updates related to this goal by clicking on the "Past Updates" button in the selected goal      .  Patient Stated      08/18/2019, get rid of excess skin    .  Weight (lb) < 200 lb (90.7 kg) (pt-stated)      Wants to get to 170 pounds    .  Weight (lb) < 200 lb (90.7 kg)      02/08/2019, wants to get down to 170-200      Depression Screen PHQ 2/9 Scores 08/18/2019 02/08/2019 08/31/2018 07/15/2018 07/05/2018 02/04/2018 12/16/2017  PHQ - 2 Score 0 0 0 0 0 0 0  PHQ- 9 Score 3 0 - - - - -    Fall Risk Fall Risk  08/18/2019 02/08/2019 08/31/2018 08/31/2018 08/31/2018  Falls in the past year? 0 0 - 0 0  Number falls in past yr: - - - 0 -  Comment - - - - -  Injury with Fall? - - - - -  Risk for fall due to : Medication side effect;Impaired  balance/gait Impaired balance/gait;Impaired mobility;Medication side effect Impaired balance/gait Orthopedic patient -  Follow up Falls evaluation completed;Education provided;Falls prevention discussed Falls evaluation completed;Education provided;Falls prevention discussed Falls evaluation completed;Education provided Falls evaluation completed;Education provided;Falls prevention discussed -    Any stairs in or around the home? No  If so, are there any without handrails? n/a Home free of loose throw rugs in walkways, pet beds, electrical cords, etc? Yes  Adequate lighting in your home to reduce risk of falls? Yes   ASSISTIVE DEVICES UTILIZED TO PREVENT FALLS:  Life alert? No  Use of a cane, walker or w/c? Yes  Grab bars in the bathroom? Yes  Shower  chair or bench in shower? Yes  Elevated toilet seat or a handicapped toilet? No   TIMED UP AND GO:  Was the test performed? No .    Gait slow and steady with assistive device  Cognitive Function:     6CIT Screen 08/18/2019 02/08/2019 02/04/2018  What Year? 0 points 0 points 0 points  What month? 0 points 0 points 0 points  What time? 0 points 0 points 0 points  Count back from 20 0 points 0 points 0 points  Months in reverse 0 points 0 points 0 points  Repeat phrase 0 points 0 points 0 points  Total Score 0 0 0    Immunizations Immunization History  Administered Date(s) Administered  . Fluad Quad(high Dose 65+) 11/10/2018  . Influenza, High Dose Seasonal PF 12/21/2017, 11/10/2018  . Influenza,inj,quad, With Preservative 02/25/2016  . Influenza-Unspecified 10/25/2016  . Moderna SARS-COVID-2 Vaccination 03/28/2019, 04/25/2019  . Pneumococcal Polysaccharide-23 11/02/2013  . Pneumococcal-Unspecified 11/24/2016  . Zoster Recombinat (Shingrix) 11/03/2018, 12/31/2018    TDAP status: Up to date Flu Vaccine status: Up to date Pneumococcal vaccine status: Up to date Covid-19 vaccine status: Completed vaccines  Qualifies for  Shingles Vaccine? Yes   Zostavax completed No   Shingrix Completed?: Yes  Screening Tests Health Maintenance  Topic Date Due  . PNA vac Low Risk Adult (2 of 2 - PCV13) 11/24/2017  . OPHTHALMOLOGY EXAM  05/06/2019  . INFLUENZA VACCINE  09/25/2019  . HEMOGLOBIN A1C  10/17/2019  . FOOT EXAM  01/17/2020  . COLONOSCOPY  09/14/2022  . TETANUS/TDAP  07/03/2025  . DEXA SCAN  Completed  . COVID-19 Vaccine  Completed  . Hepatitis C Screening  Completed    Health Maintenance  Health Maintenance Due  Topic Date Due  . PNA vac Low Risk Adult (2 of 2 - PCV13) 11/24/2017  . OPHTHALMOLOGY EXAM  05/06/2019    Colorectal cancer screening: Completed 09/13/2012. Repeat every 10 years Mammogram status: Completed 06/06/2019. Repeat every year Bone Density status: Completed 04/26/2018.  Lung Cancer Screening: (Low Dose CT Chest recommended if Age 66-80 years, 30 pack-year currently smoking OR have quit w/in 15years.) does not qualify.   Lung Cancer Screening Referral: no  Additional Screening:  Hepatitis C Screening: does qualify; Completed 08/01/2014  Vision Screening: Recommended annual ophthalmology exams for early detection of glaucoma and other disorders of the eye. Is the patient up to date with their annual eye exam?  No  Who is the provider or what is the name of the office in which the patient attends annual eye exams? Dr. Ronnald Ramp at [facility] If pt is not established with a provider, would they like to be referred to a provider to establish care? No .   Dental Screening: Recommended annual dental exams for proper oral hygiene  Community Resource Referral / Chronic Care Management: CRR required this visit?  No   CCM required this visit?  No      Plan:     I have personally reviewed and noted the following in the patient's chart:   . Medical and social history . Use of alcohol, tobacco or illicit drugs  . Current medications and supplements . Functional ability and  status . Nutritional status . Physical activity . Advanced directives . List of other physicians . Hospitalizations, surgeries, and ER visits in previous 12 months . Vitals . Screenings to include cognitive, depression, and falls . Referrals and appointments  In addition, I have reviewed and discussed with patient certain  preventive protocols, quality metrics, and best practice recommendations. A written personalized care plan for preventive services as well as general preventive health recommendations were provided to patient.     Kellie Simmering, LPN   04/05/4707   Nurse Notes: n/a

## 2019-08-18 NOTE — Progress Notes (Signed)
This visit occurred during the SARS-CoV-2 public health emergency.  Safety protocols were in place, including screening questions prior to the visit, additional usage of staff PPE, and extensive cleaning of exam room while observing appropriate contact time as indicated for disinfecting solutions.  Subjective:     [name] , female    DOB: 1945-05-28 , 74 y.o.   MRN: [id number]   Chief Complaint  Patient presents with  . Diabetes  . Hypertension    HPI  She presents for DM/HTN. She reports she just moved into her own apartment. She was tired of living in senior living housing. She has yet to unpack. She reports having markedly elevated BP and she called EMS. She chose to not go to hospital, states "she checked out okay".  She has been taking losartan '50mg'$  2 tabs twice daily.   Diabetes She presents for her follow-up diabetic visit. She has type 2 diabetes mellitus. Hypoglycemia symptoms include nervousness/anxiousness. Pertinent negatives for diabetes include no blurred vision and no chest pain. There are no hypoglycemic complications. Risk factors for coronary artery disease include diabetes mellitus, dyslipidemia, hypertension, obesity, sedentary lifestyle and post-menopausal. She is following a diabetic diet. She participates in exercise intermittently. Her home blood glucose trend is fluctuating minimally. Her breakfast blood glucose is taken between 8-9 am. Her breakfast blood glucose range is generally 110-130 mg/dl. An ACE inhibitor/angiotensin II receptor blocker is being taken. Eye exam is current.  Hypertension This is a chronic problem. The current episode started more than 1 year ago. The problem has been gradually improving since onset. The problem is uncontrolled. Pertinent negatives include no blurred vision or chest pain. Risk factors for coronary artery disease include diabetes mellitus, dyslipidemia, post-menopausal state and sedentary lifestyle. Past treatments  include angiotensin blockers and beta blockers. The current treatment provides moderate improvement.     Past Medical History:  Diagnosis Date  . Arthritis   . Chronic kidney disease    self reports ckd stage 3   . Diabetes mellitus, type II, insulin dependent ([place])    With neurologic complications. Bilateral lower extremity peripheral neuropathy  . Dyspnea    with excertion; no issues now since weight loss surgery   . Endometriosis   . Essential hypertension   . GERD (gastroesophageal reflux disease)   . 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 Hypertensive nephropathy 07/05/2018  . Hypothyroidism   . Hypothyroidism 02/06/2018  . Morbid obesity with BMI of 50.0-59.9, adult ([place])   . Scoliosis      Family History  Problem Relation Age of Onset  . Heart attack Mother   . Heart failure Mother   . Stroke Father      Current Outpatient Medications:  .  B Complex-C (B-COMPLEX WITH VITAMIN C) tablet, Take 1 tablet by mouth daily., Disp: , Rfl:  .  Biotin 10000 MCG TABS, Take 1 tablet by mouth daily., Disp: , Rfl:  .  carvedilol (COREG CR) 20 MG 24 hr capsule, TAKE 1 CAPSULE DAILY, Disp: 90 capsule, Rfl: 2 .  Cholecalciferol 5000 units TABS, Take 5,000 Units by mouth daily., Disp: , Rfl:  .  Colchicine 0.6 MG CAPS, TAKE 1 CAPSULE DAILY AS    NEEDED, Disp: 90 capsule, Rfl: 1 .  Cyanocobalamin (VITAMIN B-12) 2500 MCG SUBL, Take 2,500 mcg by mouth daily., Disp: , Rfl:  .  diclofenac Sodium (VOLTAREN) 1 % GEL, APPLY 2 GRAMS TO  AFFECTED AREA(S) 4 TIMES DAILY AS NEEDED, Disp: 200 g, Rfl: 2 .  docusate sodium (COLACE) 100 MG capsule, Take 1 capsule (100 mg total) by mouth 2 (two) times daily., Disp: 60 capsule, Rfl: 1 .  famotidine (PEPCID) 20 MG tablet, Take 20 mg by mouth daily. PRN, Disp: , Rfl:  .  gabapentin (NEURONTIN) 300 MG capsule, Take 300 mg by mouth at bedtime. , Disp: , Rfl:  .  insulin degludec (TRESIBA FLEXTOUCH) 100 UNIT/ML SOPN  FlexTouch Pen, Inject 0.09 mLs (9 Units total) into the skin daily at 10 pm. (Patient taking differently: Inject 8 Units into the skin daily at 10 pm. ), Disp: 5 pen, Rfl: 2 .  Multiple Vitamins-Minerals (MULTIVITAMIN WITH MINERALS) tablet, Take 1 tablet by mouth daily. Women's 50+, Disp: , Rfl:  .  naproxen sodium (ANAPROX) 220 MG tablet, Take 440 mg by mouth 2 (two) times daily as needed (pain). 2 tablets every night at bedtime, Disp: , Rfl:  .  OZEMPIC, 1 MG/DOSE, 2 MG/1.5ML SOPN, INJECT INTO SKIN ONCE A WEEK (Patient taking differently: '1mg'$ ), Disp: 9 pen, Rfl: 1 .  simvastatin (ZOCOR) 10 MG tablet, TAKE 1 TABLET DAILY, Disp: 90 tablet, Rfl: 1 .  SYNTHROID 25 MCG tablet, Take 0.5 tablet (12.5 mcg) by mouth on Mondays through Fridays, then take 1 tablet (25 mcg) by mouth on Saturdays and Sundays., Disp: 90 tablet, Rfl: 1 .  amLODipine (NORVASC) 5 MG tablet, Take 1 tablet (5 mg total) by mouth daily., Disp: 30 tablet, Rfl: 1 .  losartan (COZAAR) 100 MG tablet, Take 1 tablet (100 mg total) by mouth daily., Disp: 90 tablet, Rfl: 2   Allergies  Allergen Reactions  . Meloxicam Other (See Comments)    Fever; muscle aches; "flu-like" symptoms  . Other     Rose fever and hay fever      Review of Systems  Constitutional: Negative.   Eyes: Negative for blurred vision.  Respiratory: Negative.   Cardiovascular: Negative.  Negative for chest pain.  Gastrointestinal: Negative.   Neurological: Negative.   Psychiatric/Behavioral: The patient is nervous/anxious.      Today's Vitals   08/18/19 1115  BP: (!) 144/82  Pulse: 80  Temp: 98.2 F (36.8 C)  TempSrc: Oral  Weight: 219 lb (99.3 kg)  PainSc: 6   PainLoc: Back   Body mass index is 36.44 kg/m.   Wt Readings from Last 3 Encounters:  08/18/19 219 lb (99.3 kg)  04/19/19 223 lb 12.8 oz (101.5 kg)  02/08/19 226 lb 12.8 oz (102.9 kg)     Objective:  Physical Exam Vitals and nursing note reviewed.  Constitutional:      Appearance:  Normal appearance. She is obese.  HENT:     Head: Normocephalic and atraumatic.  Cardiovascular:     Rate and Rhythm: Normal rate and regular rhythm.     Heart sounds: Normal heart sounds.  Pulmonary:     Effort: Pulmonary effort is normal.     Breath sounds: Normal breath sounds.  Musculoskeletal:     Comments: Ambulatory with walker  Skin:    General: Skin is warm.  Neurological:     General: No focal deficit present.     Mental Status: She is alert.  Psychiatric:        Mood and Affect: Mood normal.        Behavior: Behavior normal.         Assessment And Plan:     1. Diabetes mellitus with stage  3 chronic kidney disease (HCC)  Chronic, I will check labs as listed below. She is encouraged to increase activity as tolerated. Encouraged to perform chair exercises while watching TV.   - CMP14+EGFR - Hemoglobin A1c  2. Hypertensive nephropathy  Chronic, uncontrolled. Advised to cut back to losartan 42m 2 tabs daily ONLY. I will add amlodipine, 577monce daily with evening meal.  She is also encouraged to avoid adding salt to her foods. She agrees to rto in 2 weeks for nurse visit.   3. Class 2 severe obesity due to excess calories with serious comorbidity and body mass index (BMI) of 36.0 to 36.9 in adult [facility] She is encouraged to strive for BMI less than 30 to decrease cardiac risk. Importance of regular exercise was discussed with the patient.   4. Screening for STDs (sexually transmitted diseases)  She reports having unprotected intercourse and wants to be tested for STDs.   - HIV antibody (with reflex) - RPR - HSV Type I/II IgG, IgMw/ reflex - Chlamydia/Gonococcus/Trichomonas, NAA     RoMaximino GreenlandMD    THE PATIENT IS ENCOURAGED TO PRACTICE SOCIAL DISTANCING DUE TO THE COVID-19 PANDEMIC.

## 2019-08-19 ENCOUNTER — Telehealth: Payer: Self-pay

## 2019-08-19 LAB — HEMOGLOBIN A1C
Est. average glucose Bld gHb Est-mCnc: 140 mg/dL
Hgb A1c MFr Bld: 6.5 % — ABNORMAL HIGH (ref 4.8–5.6)

## 2019-08-19 LAB — CMP14+EGFR
ALT: 18 IU/L (ref 0–32)
AST: 28 IU/L (ref 0–40)
Albumin/Globulin Ratio: 1.2 (ref 1.2–2.2)
Albumin: 3.9 g/dL (ref 3.7–4.7)
Alkaline Phosphatase: 94 IU/L (ref 48–121)
BUN/Creatinine Ratio: 14 (ref 12–28)
BUN: 12 mg/dL (ref 8–27)
Bilirubin Total: 0.5 mg/dL (ref 0.0–1.2)
CO2: 26 mmol/L (ref 20–29)
Calcium: 9.6 mg/dL (ref 8.7–10.3)
Chloride: 101 mmol/L (ref 96–106)
Creatinine, Ser: 0.83 mg/dL (ref 0.57–1.00)
GFR calc Af Amer: 80 mL/min/{1.73_m2} (ref >59)
GFR calc non Af Amer: 69 mL/min/{1.73_m2} (ref >59)
Globulin, Total: 3.2 g/dL (ref 1.5–4.5)
Glucose: 119 mg/dL — ABNORMAL HIGH (ref 65–99)
Potassium: 4 mmol/L (ref 3.5–5.2)
Sodium: 140 mmol/L (ref 134–144)
Total Protein: 7.1 g/dL (ref 6.0–8.5)

## 2019-08-19 LAB — RPR: RPR Ser Ql: NONREACTIVE

## 2019-08-19 LAB — HSV TYPE I/II IGG, IGMW/ REFLEX
HSV 1 Glycoprotein G Ab, IgG: <0.91 index (ref 0.00–0.90)
HSV 1 IgM: <1:10 {titer}
HSV 2 IgG, Type Spec: 15.9 index — ABNORMAL HIGH (ref 0.00–0.90)
HSV 2 IgM: <1:10 {titer}

## 2019-08-19 LAB — HIV ANTIBODY (ROUTINE TESTING W REFLEX): HIV Screen 4th Generation wRfx: NONREACTIVE

## 2019-08-20 ENCOUNTER — Encounter: Payer: Self-pay | Admitting: Internal Medicine

## 2019-08-21 LAB — CHLAMYDIA/GONOCOCCUS/TRICHOMONAS, NAA
Chlamydia by NAA: NEGATIVE
Gonococcus by NAA: NEGATIVE
Trich vag by NAA: NEGATIVE

## 2019-08-23 ENCOUNTER — Ambulatory Visit: Payer: Self-pay

## 2019-08-23 DIAGNOSIS — N183 Chronic kidney disease, stage 3 unspecified: Secondary | ICD-10-CM

## 2019-08-23 DIAGNOSIS — I129 Hypertensive chronic kidney disease with stage 1 through stage 4 chronic kidney disease, or unspecified chronic kidney disease: Secondary | ICD-10-CM

## 2019-08-23 DIAGNOSIS — E1122 Type 2 diabetes mellitus with diabetic chronic kidney disease: Secondary | ICD-10-CM

## 2019-08-23 NOTE — Chronic Care Management (AMB) (Signed)
  Chronic Care Management   Outreach Note  08/23/2019 Name: Teresa Golden MRN: [id number] DOB: [birth year]  Referred by: Glendale Chard, MD Reason for referral : Chronic Care Management (FU RN CM inbound call from patient )  Received voice message from patient requesting a return phone call. An unsuccessful telephone outreach was attempted today. The patient was referred to the case management team for assistance with care management and care coordination.   Follow Up Plan: A HIPPA compliant phone message was left for the patient providing contact information and requesting a return call.  Telephone follow up appointment with care management team member scheduled for: 09/02/19  Barb Merino, RN, BSN, CCM Care Management Coordinator [place] [facility]  Direct Phone: [phone]

## 2019-08-24 ENCOUNTER — Ambulatory Visit: Payer: Self-pay

## 2019-08-24 ENCOUNTER — Other Ambulatory Visit: Payer: Self-pay

## 2019-08-24 ENCOUNTER — Telehealth: Payer: Self-pay

## 2019-08-24 DIAGNOSIS — I129 Hypertensive chronic kidney disease with stage 1 through stage 4 chronic kidney disease, or unspecified chronic kidney disease: Secondary | ICD-10-CM

## 2019-08-24 DIAGNOSIS — E1122 Type 2 diabetes mellitus with diabetic chronic kidney disease: Secondary | ICD-10-CM

## 2019-08-24 DIAGNOSIS — N183 Chronic kidney disease, stage 3 unspecified: Secondary | ICD-10-CM

## 2019-08-25 NOTE — Patient Instructions (Signed)
Visit Information  Goals Addressed      Patient Stated   .  "to better understand my lab values" (pt-stated)        CARE PLAN ENTRY (see longitudinal plan of care for additional care plan information)  Current Barriers:  [name] Knowledge Deficits related to disease process and Self Health management for Herpes Simplex 2 virus  . Chronic Disease Management support and education needs related to DM, CKD III, HTN, Class 2 severe obesity  Nurse Case Manager Clinical Goal(s):  [name] Over the next 90 days, patient will verbalize basic understanding of HSV-2 disease process and self health management plan as evidenced by patient will take steps to minimize the risk for outbreaks   CCM RN CM Interventions:  08/24/19 call completed with patient  . Inter-disciplinary care team collaboration (see longitudinal plan of care) . Evaluation of current treatment plan related to positive HSV-2 and patient's adherence to plan as established by provider. . Provided education to patient re: disease process for HSV-2; Ways to handle outbreaks, Educated on how to avoid outbreaks by staying healthy and when to call the MD; Educated on importance of avoiding sexual activity and staying away from pregnant women during outbreak to avoid spreading the virus . Discussed plans with patient for ongoing care management follow up and provided patient with direct contact information for care management team  Patient Self Care Activities:  . Self administers medications as prescribed . Attends all scheduled provider appointments . Calls pharmacy for medication refills . Performs ADL's independently . Performs IADL's independently . Calls provider office for new concerns or questions  Initial goal documentation     .  "To keep my BP well managed" (pt-stated)        Current Barriers:  [name] Knowledge Deficits related to Hypertension disease process and Self Health Management  . Chronic Disease Management support and education  needs related to DMII, CKDIII, HTN, Class 2 Severe Obesity   Nurse Case Manager Clinical Goal(s):  [name] Over the next 90 days, patient will work with the CCM team to address needs related to disease education and support for HTN  Goal not met . New 07/21/19 Over the next 45 days the patient will work with RN Care Manager to develop an individualized plan of care related to disease education surrounding HTN Goal Met  . New 08/24/19 Over the next 60 days, patient will verbalize having achieved/maintained target BP <130/80  CCM RN CM Interventions:  08/24/19 inbound call completed with patient . Inter-disciplinary care team collaboration (see longitudinal plan of care) . Evaluation of current treatment plan related to HTN and patient's adherence to plan as established by provider. . Provided education to patient re: importance of taking medications exactly as prescribed w/o missed doses for best effectiveness; Educated on target BP <130/80; Educated on importance of balancing activity with rest, and reducing Sodium intake  . Reviewed medications with patient and discussed Dr. Baird Cancer added Amlodipine 5 mg daily to patient's current regimen  . Advised patient, providing education and rationale, to monitor blood pressure daily and record, calling the CCM team and or PCP for findings outside established parameters . Determined patient is currently unable to check her BP due to a recent move and she is unable to locate her BP cuff; Patient agreed to resume Self Checking her BP once her BP monitor is available for use, she will record her readings to share with PCP nurse visit scheduled for 2 weeks  . Discussed plans with  patient for ongoing care management follow up and provided patient with direct contact information for care management team  Patient Self Care Activities:  . Self administers medications as prescribed . Attends all scheduled provider appointments . Calls pharmacy for medication  refills . Performs ADL's independently . Performs IADL's independently . Calls provider office for new concerns or questions  Please see past updates related to this goal by clicking on the "Past Updates" button in the selected goal         Patient verbalizes understanding of instructions provided today.   Telephone follow up appointment with care management team member scheduled for: 10/06/19  Barb Merino, RN, BSN, CCM Care Management Coordinator [place] [facility]  Direct Phone: [phone]

## 2019-08-25 NOTE — Chronic Care Management (AMB) (Signed)
Chronic Care Management   Follow Up Note   08/24/2019 Name: Teresa Golden MRN: [id number] DOB: [birth year]  Referred by: Teresa Chard, MD Reason for referral : Chronic Care Management (FU RN CM Inbound call from patient )   Teresa Golden is a 74 y.o. year old female who is a primary care patient of Teresa Chard, MD. The CCM team was consulted for assistance with chronic disease management and care coordination needs.    Review of patient status, including review of consultants reports, relevant laboratory and other test results, and collaboration with appropriate care team members and the patient's provider was performed as part of comprehensive patient evaluation and provision of chronic care management services.    SDOH (Social Determinants of Health) assessments performed: Yes - no acute challenges  See Care Plan activities for detailed interventions related to [place])   Completed FU RN CM inbound call from patient.     Outpatient Encounter Medications as of 08/24/2019  Medication Sig  . amLODipine (NORVASC) 5 MG tablet Take 1 tablet (5 mg total) by mouth daily.  . B Complex-C (B-COMPLEX WITH VITAMIN C) tablet Take 1 tablet by mouth daily.  . Biotin 10000 MCG TABS Take 1 tablet by mouth daily.  . carvedilol (COREG CR) 20 MG 24 hr capsule TAKE 1 CAPSULE DAILY  . Cholecalciferol 5000 units TABS Take 5,000 Units by mouth daily.  . Colchicine 0.6 MG CAPS TAKE 1 CAPSULE DAILY AS    NEEDED  . Cyanocobalamin (VITAMIN B-12) 2500 MCG SUBL Take 2,500 mcg by mouth daily.  . diclofenac Sodium (VOLTAREN) 1 % GEL APPLY 2 GRAMS TO AFFECTED AREA(S) 4 TIMES DAILY AS NEEDED  . docusate sodium (COLACE) 100 MG capsule Take 1 capsule (100 mg total) by mouth 2 (two) times daily.  . famotidine (PEPCID) 20 MG tablet Take 20 mg by mouth daily. PRN  . gabapentin (NEURONTIN) 300 MG capsule Take 300 mg by mouth at bedtime.   . insulin degludec (TRESIBA FLEXTOUCH) 100 UNIT/ML SOPN FlexTouch Pen Inject 0.09  mLs (9 Units total) into the skin daily at 10 pm. (Patient taking differently: Inject 8 Units into the skin daily at 10 pm. )  . losartan (COZAAR) 100 MG tablet Take 1 tablet (100 mg total) by mouth daily.  . Multiple Vitamins-Minerals (MULTIVITAMIN WITH MINERALS) tablet Take 1 tablet by mouth daily. Women's 50+  . naproxen sodium (ANAPROX) 220 MG tablet Take 440 mg by mouth 2 (two) times daily as needed (pain). 2 tablets every night at bedtime  . OZEMPIC, 1 MG/DOSE, 2 MG/1.5ML SOPN INJECT INTO SKIN ONCE A WEEK (Patient taking differently: 6m)  . simvastatin (ZOCOR) 10 MG tablet TAKE 1 TABLET DAILY  . SYNTHROID 25 MCG tablet Take 0.5 tablet (12.5 mcg) by mouth on Mondays through Fridays, then take 1 tablet (25 mcg) by mouth on Saturdays and Sundays.   No facility-administered encounter medications on file as of 08/24/2019.     Objective:  Lab Results  Component Value Date   HGBA1C 6.5 (H) 08/18/2019   HGBA1C 6.3 (H) 04/19/2019   HGBA1C 6.4 (H) 01/17/2019   Lab Results  Component Value Date   MICROALBUR 10 01/17/2019   LDLCALC 82 01/17/2019   CREATININE 0.83 08/18/2019   BP Readings from Last 3 Encounters:  08/18/19 (!) 144/82  08/18/19 (!) 144/82  04/19/19 122/80   Goals Addressed      Patient Stated   .  "to better understand my lab values" (pt-stated)  CARE PLAN ENTRY (see longitudinal plan of care for additional care plan information)  Current Barriers:  Teresa Golden Knowledge Deficits related to disease process and Self Health management for Herpes Simplex 2 virus  . Chronic Disease Management support and education needs related to DM, CKD III, HTN, Class 2 severe obesity  Nurse Case Manager Clinical Goal(s):  Teresa Golden Over the next 90 days, patient will verbalize basic understanding of HSV-2 disease process and self health management plan as evidenced by patient will take steps to minimize the risk for outbreaks   CCM RN CM Interventions:  08/24/19 call completed with patient   . Inter-disciplinary care team collaboration (see longitudinal plan of care) . Evaluation of current treatment plan related to positive HSV-2 and patient's adherence to plan as established by provider. . Provided education to patient re: disease process for HSV-2; Ways to handle outbreaks, Educated on how to avoid outbreaks by staying healthy and when to call the MD; Educated on importance of avoiding sexual activity and staying away from pregnant women during outbreak to avoid spreading the virus . Discussed plans with patient for ongoing care management follow up and provided patient with direct contact information for care management team  Patient Self Care Activities:  . Self administers medications as prescribed . Attends all scheduled provider appointments . Calls pharmacy for medication refills . Performs ADL's independently . Performs IADL's independently . Calls provider office for new concerns or questions  Initial goal documentation     .  "To keep my BP well managed" (pt-stated)        Current Barriers:  Teresa Golden Knowledge Deficits related to Hypertension disease process and Self Health Management  . Chronic Disease Management support and education needs related to DMII, CKDIII, HTN, Class 2 Severe Obesity   Nurse Case Manager Clinical Goal(s):  Teresa Golden Over the next 90 days, patient will work with the CCM team to address needs related to disease education and support for HTN  Goal not met . New 07/21/19 Over the next 45 days the patient will work with RN Care Manager to develop an individualized plan of care related to disease education surrounding HTN Goal Met  . New 08/24/19 Over the next 60 days, patient will verbalize having achieved/maintained target BP <130/80  CCM RN CM Interventions:  08/24/19 inbound call completed with patient . Inter-disciplinary care team collaboration (see longitudinal plan of care) . Evaluation of current treatment plan related to HTN and patient's  adherence to plan as established by provider. . Provided education to patient re: importance of taking medications exactly as prescribed w/o missed doses for best effectiveness; Educated on target BP <130/80; Educated on importance of balancing activity with rest, and reducing Sodium intake  . Reviewed medications with patient and discussed Dr. Baird Cancer added Amlodipine 5 mg daily to patient's current regimen  . Advised patient, providing education and rationale, to monitor blood pressure daily and record, calling the CCM team and or PCP for findings outside established parameters . Determined patient is currently unable to check her BP due to a recent move and she is unable to locate her BP cuff; Patient agreed to resume Self Checking her BP once her BP monitor is available for use, she will record her readings to share with PCP nurse visit scheduled for 2 weeks  . Discussed plans with patient for ongoing care management follow up and provided patient with direct contact information for care management team  Patient Self Care Activities:  . Self administers medications as  prescribed . Attends all scheduled provider appointments . Calls pharmacy for medication refills . Performs ADL's independently . Performs IADL's independently . Calls provider office for new concerns or questions  Please see past updates related to this goal by clicking on the "Past Updates" button in the selected goal        Plan:   Telephone follow up appointment with care management team member scheduled for: 10/06/19  Barb Merino, RN, BSN, CCM Care Management Coordinator [place] [facility]  Direct Phone: [phone]

## 2019-08-31 ENCOUNTER — Ambulatory Visit: Payer: Self-pay

## 2019-08-31 ENCOUNTER — Ambulatory Visit: Payer: Medicare HMO | Admitting: Psychology

## 2019-08-31 DIAGNOSIS — I129 Hypertensive chronic kidney disease with stage 1 through stage 4 chronic kidney disease, or unspecified chronic kidney disease: Secondary | ICD-10-CM

## 2019-08-31 DIAGNOSIS — E1122 Type 2 diabetes mellitus with diabetic chronic kidney disease: Secondary | ICD-10-CM

## 2019-08-31 DIAGNOSIS — N183 Chronic kidney disease, stage 3 unspecified: Secondary | ICD-10-CM

## 2019-08-31 NOTE — Chronic Care Management (AMB) (Signed)
   Chronic Care Management Pharmacy  Name: Teresa Golden  MRN: [id number] DOB: [birth year]  Chief Complaint/ HPI  Teresa Golden,  74 y.o. , female presents for their appointment with PCP.   PCP : Glendale Chard, MD   Met with patient today after office visit with PCP to help coordinate delivery of new BP medication, Amlodipine 19m daily in the evening. Transportation is an issue for patient. Coordinated delivery of Amlodipine through [facility] for this afternoon. Pt would also like a medication cost comparison between current pharmacy and [facility]. Will discuss in detail at follow up visit.    Follow up: 09/27/19 phone visit  CJannette Fogo PharmD Clinical Pharmacist [facility] [phone]

## 2019-08-31 NOTE — Patient Instructions (Addendum)
Visit Information  Goals Addressed            This Visit's Progress   . Pharmacy Care Plan       CARE PLAN ENTRY (see longitudinal plan of care for additional care plan information)  Current Barriers:  . Chronic Disease Management support, education, and care coordination needs related to Hypertension, Diabetes, and Hypothyroidism   Hypertension BP Readings from Last 3 Encounters:  08/18/19 (!) 144/82  08/18/19 (!) 144/82  04/19/19 122/80   . Pharmacist Clinical Goal(s): o Over the next 90 days, patient will work with PharmD and providers to achieve BP goal <130/80 . Current regimen:   Carvedilol CR 20mg  daily  Losartan 50mg  twice daily . Interventions: o Recommend patient increase exercise to 30 minutes daily 5 times per week . Patient self care activities - Over the next 90 days, patient will: o Check BP periodically and if symptomatic, document, and provide at future appointments o Ensure daily salt intake < 2300 mg/day o Exercise 30 minutes per day, 5 days per week  Hyperlipidemia Lab Results  Component Value Date/Time   LDLCALC 82 01/17/2019 11:56 AM   . Pharmacist Clinical Goal(s): o Over the next 90 days, patient will work with PharmD and providers to achieve LDL goal < 70 . Current regimen:  o Simvastatin 10mg  daily (Monday through Friday) . Interventions: o Recommend lipid panel at next PCP appointment . Patient self care activities - Over the next 90 days, patient will: o Take cholesterol medication as directed o Exercise 30 minutes daily 5 times per week  Diabetes Lab Results  Component Value Date/Time   HGBA1C 6.5 (H) 08/18/2019 02:10 PM   HGBA1C 6.3 (H) 04/19/2019 12:12 PM   . Pharmacist Clinical Goal(s): o Over the next 90 days, patient will work with PharmD and providers to maintain A1c goal <7% . Current regimen:   Tresiba 100 units/mL 8 units daily   Ozempic 1mg  weekly . Interventions: o Discussed appropriate blood sugar goals (less  than 130 if fasting, less than 180 if 2 hours after eating) o Will assist with [place] patient assistance if needed o Provided dietary and exercise recommendations . Patient self care activities - Over the next 90 days, patient will: o Check blood sugar twice daily, document, and provide at future appointments o Contact provider with any episodes of hypoglycemia o Exercise 30 minutes daily 5 times per week  Leg Cramps . Pharmacist Clinical Goal(s) o Over the next 90 days, patient will work with PharmD and providers to improve symptoms . Current regimen:  o N/A . Interventions: o Recommend patient try magnesium topical cream applied nightly for leg cramps o Separate from application of Voltaren . Patient self care activities - Over the next 90 days, patient will: o Apply magnesium cream topically at night as needed  Medication management . Pharmacist Clinical Goal(s): o Over the next 90 days, patient will work with PharmD and providers to maintain optimal medication adherence . Current pharmacy: [facility] . Interventions o Comprehensive medication review performed. o Continue current medication management strategy . Patient self care activities - Over the next 90 days, patient will: o Focus on medication adherence by utilization of pill box o Take medications as prescribed o Report any questions or concerns to PharmD and/or provider(s)  Initial goal documentation        Teresa Golden was given information about Chronic Care Management services today including:  1. CCM service includes personalized support from designated clinical staff  supervised by her physician, including individualized plan of care and coordination with other care providers 2. 24/7 contact phone numbers for assistance for urgent and routine care needs. 3. Standard insurance, coinsurance, copays and deductibles apply for chronic care management only during months in which we provide at least 20 minutes of  these services. Most insurances cover these services at 100%, however patients may be responsible for any copay, coinsurance and/or deductible if applicable. This service may help you avoid the need for more expensive face-to-face services. 4. Only one practitioner may furnish and bill the service in a calendar month. 5. The patient may stop CCM services at any time (effective at the end of the month) by phone call to the office staff.  Patient agreed to services and verbal consent obtained.   The patient verbalized understanding of instructions provided today and agreed to receive a mailed copy of patient instruction and/or educational materials. Telephone follow up appointment with pharmacy team member scheduled for: 09/27/19 @ 3:30 PM  Jannette Fogo, PharmD Clinical Pharmacist [facility] [phone]    Diabetes Mellitus and Nutrition, Adult When you have diabetes (diabetes mellitus), it is very important to have healthy eating habits because your blood sugar (glucose) levels are greatly affected by what you eat and drink. Eating healthy foods in the appropriate amounts, at about the same times every day, can help you:  Control your blood glucose.  Lower your risk of heart disease.  Improve your blood pressure.  Reach or maintain a healthy weight. Every person with diabetes is different, and each person has different needs for a meal plan. Your health care provider may recommend that you work with a diet and nutrition specialist (dietitian) to make a meal plan that is best for you. Your meal plan may vary depending on factors such as:  The calories you need.  The medicines you take.  Your weight.  Your blood glucose, blood pressure, and cholesterol levels.  Your activity level.  Other health conditions you have, such as heart or kidney disease. How do carbohydrates affect me? Carbohydrates, also called carbs, affect your blood glucose level more  than any other type of food. Eating carbs naturally raises the amount of glucose in your blood. Carb counting is a method for keeping track of how many carbs you eat. Counting carbs is important to keep your blood glucose at a healthy level, especially if you use insulin or take certain oral diabetes medicines. It is important to know how many carbs you can safely have in each meal. This is different for every person. Your dietitian can help you calculate how many carbs you should have at each meal and for each snack. Foods that contain carbs include:  Bread, cereal, rice, pasta, and crackers.  Potatoes and corn.  Peas, beans, and lentils.  Milk and yogurt.  Fruit and juice.  Desserts, such as cakes, cookies, ice cream, and candy. How does alcohol affect me? Alcohol can cause a sudden decrease in blood glucose (hypoglycemia), especially if you use insulin or take certain oral diabetes medicines. Hypoglycemia can be a life-threatening condition. Symptoms of hypoglycemia (sleepiness, dizziness, and confusion) are similar to symptoms of having too much alcohol. If your health care provider says that alcohol is safe for you, follow these guidelines:  Limit alcohol intake to no more than 1 drink per day for nonpregnant women and 2 drinks per day for men. One drink equals 12 oz of beer, 5 oz of wine, or 1  oz of hard liquor.  Do not drink on an empty stomach.  Keep yourself hydrated with water, diet soda, or unsweetened iced tea.  Keep in mind that regular soda, juice, and other mixers may contain a lot of sugar and must be counted as carbs. What are tips for following this plan?  Reading food labels  Start by checking the serving size on the "Nutrition Facts" label of packaged foods and drinks. The amount of calories, carbs, fats, and other nutrients listed on the label is based on one serving of the item. Many items contain more than one serving per package.  Check the total grams (g) of  carbs in one serving. You can calculate the number of servings of carbs in one serving by dividing the total carbs by 15. For example, if a food has 30 g of total carbs, it would be equal to 2 servings of carbs.  Check the number of grams (g) of saturated and trans fats in one serving. Choose foods that have low or no amount of these fats.  Check the number of milligrams (mg) of salt (sodium) in one serving. Most people should limit total sodium intake to less than 2,300 mg per day.  Always check the nutrition information of foods labeled as "low-fat" or "nonfat". These foods may be higher in added sugar or refined carbs and should be avoided.  Talk to your dietitian to identify your daily goals for nutrients listed on the label. Shopping  Avoid buying canned, premade, or processed foods. These foods tend to be high in fat, sodium, and added sugar.  Shop around the outside edge of the grocery store. This includes fresh fruits and vegetables, bulk grains, fresh meats, and fresh dairy. Cooking  Use low-heat cooking methods, such as baking, instead of high-heat cooking methods like deep frying.  Cook using healthy oils, such as olive, canola, or sunflower oil.  Avoid cooking with butter, cream, or high-fat meats. Meal planning  Eat meals and snacks regularly, preferably at the same times every day. Avoid going long periods of time without eating.  Eat foods high in fiber, such as fresh fruits, vegetables, beans, and whole grains. Talk to your dietitian about how many servings of carbs you can eat at each meal.  Eat 4-6 ounces (oz) of lean protein each day, such as lean meat, chicken, fish, eggs, or tofu. One oz of lean protein is equal to: ? 1 oz of meat, chicken, or fish. ? 1 egg. ?  cup of tofu.  Eat some foods each day that contain healthy fats, such as avocado, nuts, seeds, and fish. Lifestyle  Check your blood glucose regularly.  Exercise regularly as told by your health care  provider. This may include: ? 150 minutes of moderate-intensity or vigorous-intensity exercise each week. This could be brisk walking, biking, or water aerobics. ? Stretching and doing strength exercises, such as yoga or weightlifting, at least 2 times a week.  Take medicines as told by your health care provider.  Do not use any products that contain nicotine or tobacco, such as cigarettes and e-cigarettes. If you need help quitting, ask your health care provider.  Work with a Social worker or diabetes educator to identify strategies to manage stress and any emotional and social challenges. Questions to ask a health care provider  Do I need to meet with a diabetes educator?  Do I need to meet with a dietitian?  What number can I call if I have questions?  When  are the best times to check my blood glucose? Where to find more information:  American Diabetes Association: diabetes.org  Academy of Nutrition and Dietetics: www.eatright.[employer] of Diabetes and Digestive and Kidney Diseases (NIH): [employer] Summary  A healthy meal plan will help you control your blood glucose and maintain a healthy lifestyle.  Working with a diet and nutrition specialist (dietitian) can help you make a meal plan that is best for you.  Keep in mind that carbohydrates (carbs) and alcohol have immediate effects on your blood glucose levels. It is important to count carbs and to use alcohol carefully. This information is not intended to replace advice given to you by your health care provider. Make sure you discuss any questions you have with your health care provider. Document Revised: 01/23/2017 Document Reviewed: 03/17/2016 Elsevier Patient Education  2020 [employer].

## 2019-09-02 ENCOUNTER — Telehealth: Payer: Self-pay

## 2019-09-06 ENCOUNTER — Other Ambulatory Visit: Payer: Self-pay

## 2019-09-06 ENCOUNTER — Ambulatory Visit: Payer: Medicare HMO

## 2019-09-06 VITALS — BP 132/70 | HR 70 | Temp 97.9°F | Ht 65.0 in | Wt 214.4 lb

## 2019-09-06 DIAGNOSIS — I129 Hypertensive chronic kidney disease with stage 1 through stage 4 chronic kidney disease, or unspecified chronic kidney disease: Secondary | ICD-10-CM

## 2019-09-06 MED ORDER — AMLODIPINE BESYLATE 5 MG PO TABS: 5.0000 mg | ORAL_TABLET | Freq: Every day | ORAL | 1 refills | Status: DC

## 2019-09-06 NOTE — Progress Notes (Signed)
Pt is taking amlodipine 5 and losartan 100. Pt also brought her blood pressure cuff 148/80 is the reading that it gave. Today I got 132/72. Pt will follow up with provider in september

## 2019-09-12 ENCOUNTER — Ambulatory Visit: Payer: Medicare HMO | Admitting: Psychology

## 2019-09-27 ENCOUNTER — Ambulatory Visit (INDEPENDENT_AMBULATORY_CARE_PROVIDER_SITE_OTHER): Payer: Medicare HMO

## 2019-09-27 DIAGNOSIS — I129 Hypertensive chronic kidney disease with stage 1 through stage 4 chronic kidney disease, or unspecified chronic kidney disease: Secondary | ICD-10-CM | POA: Diagnosis not present

## 2019-09-27 DIAGNOSIS — N183 Chronic kidney disease, stage 3 unspecified: Secondary | ICD-10-CM

## 2019-09-27 DIAGNOSIS — E1122 Type 2 diabetes mellitus with diabetic chronic kidney disease: Secondary | ICD-10-CM

## 2019-09-27 NOTE — Chronic Care Management (AMB) (Signed)
Chronic Care Management Pharmacy  Name: Teresa Golden  MRN: [id number] DOB: [birth year]  Chief Complaint/ HPI  Teresa Golden,  74 y.o. , female presents for their Follow-Up CCM visit with the clinical pharmacist via telephone due to COVID-19 Pandemic.  PCP : Glendale Chard, MD  Their chronic conditions include: Type 2 Diabetes mellitus, Hypertension, Hypothyroidism, CKD, Osteoarthritis  Office Visits: 09/06/19 Nurse visit: BP 132/72 manually, 148/80 on home BP cuff pt brought today to office. Follow up with PCP in September.   08/18/19 AWV and OV: HTN uncontrolled. Advised pt to decrease losartan back to 50 mg twice daily. Start amlodipine 77m once daily with evening meal. Return in 2 weeks for nurse visit. Liver function, kidney function, and HgbA1c stable. STD tests negative except for exposure to herpes type 2.   07/27/19 Telephone call: TTyler Aasdecreased to 8 units nightly  04/19/19 OV: Presented for DM and HTN follow up visit. Pt reported more anxiousness lately due to current living situation. She lives in an assisted living facility and stated that it has too many rules and she cannot have visitors, she is looking for a new apartment. DM chronic, fair control. Plan to wean off of insulin eventually. Labs ordered (HgbA1c, BMP8+EGFR). Pt denied prescription treatment for anxiety but would like to start therapy. She may also benefit from Mg supplementation. Referred to Hepatology for further evaluation of cirrhosis of the liver and hepatoma.  02/08/19 AWV  Consult Visit:  CCM Encounters: 08/16/19 RN: Pt called about elevated BPs for several days. Pt called EMS Sunday due to severe headache and BP 190/100. Pt decided to double up on losartan 553m(started taking 2 tabs twice daily instead of 1 tablet twice daily). Advised pt not safe to double up on losartan. OV scheduled for 6/24 with PCP.   07/26/19 SW and RN: Discussed pt current diabetes treatment regimen (Tresiba 14 units daily). Pt  reported low BG readings (low 80s) and would like to decrease Tr[place]igua and Barbudaf possible. Average BG readings 108-117. Referred to pharmacy for diabetes management. Collaborated with PCP regarding FBG readings and Tresiba dosing, will decrease to 8 units daily.  06/22/19 RN: Evaluated diabetes treatment plan and pt goals. Provided pt education regarding diet, exercise, and medication adherence. Advised pt to check BG 1-2 times daily before meals and record.   03/29/19: Ozempic approved via [employer] 02/24/20.  03/28/19 SW: Addressed care coordination needs regard emergency alert system and arranging ental care  03/21/19 RN: Discussed timing of COVID and Shingrix vaccines  03/18/19 SW: Care coordination and collaboration regarding dental appointment. Completed Ozempic PAP finished and faxed to [employer]  1/11/2 PharmD: Pt reported FBGs have not exceeded 130. She increased Tresiba to 14 units daily due to higher blood sugars (she had been using 7-10 units nightly). Attempting to get FreeStyle Libre covered on insurance, but unlikely due to current guidelines. Pt reported taking statin M/W/F. Stated she is trying to take daily now. Encourage pt to continue aspirin 8168maily.   03/04/19 RN: Discussed proper timing of Shingrix vaccine and COVID vaccines. Instructed to get second shingles vaccine as planned (2/15) and then wait 2 weeks until receiving COVID vaccines  02/28/19 PharmD: Comprehensive medication review performed.   Medications: Outpatient Encounter Medications as of 09/27/2019  Medication Sig  . amLODipine (NORVASC) 5 MG tablet Take 1 tablet (5 mg total) by mouth daily.  . B Complex-C (B-COMPLEX WITH VITAMIN C) tablet Take 1 tablet by mouth daily.  . Biotin  10000 MCG TABS Take 1 tablet by mouth daily.  . carvedilol (COREG CR) 20 MG 24 hr capsule TAKE 1 CAPSULE DAILY  . Cholecalciferol 5000 units TABS Take 5,000 Units by mouth daily.  . Colchicine 0.6 MG CAPS TAKE 1 CAPSULE DAILY AS     NEEDED  . Cyanocobalamin (VITAMIN B-12) 2500 MCG SUBL Take 2,500 mcg by mouth daily.  . diclofenac Sodium (VOLTAREN) 1 % GEL APPLY 2 GRAMS TO AFFECTED AREA(S) 4 TIMES DAILY AS NEEDED  . docusate sodium (COLACE) 100 MG capsule Take 1 capsule (100 mg total) by mouth 2 (two) times daily. (Patient not taking: Reported on 09/30/2019)  . famotidine (PEPCID) 20 MG tablet Take 20 mg by mouth daily. PRN  . gabapentin (NEURONTIN) 300 MG capsule Take 300 mg by mouth at bedtime.   . insulin degludec (TRESIBA FLEXTOUCH) 100 UNIT/ML SOPN FlexTouch Pen Inject 0.09 mLs (9 Units total) into the skin daily at 10 pm. (Patient taking differently: Inject 8 Units into the skin daily at 10 pm. )  . losartan (COZAAR) 100 MG tablet Take 1 tablet (100 mg total) by mouth daily.  . Multiple Vitamins-Minerals (MULTIVITAMIN WITH MINERALS) tablet Take 1 tablet by mouth daily. Women's 50+  . naproxen sodium (ANAPROX) 220 MG tablet Take 440 mg by mouth 2 (two) times daily as needed (pain). 2 tablets every night at bedtime  . OZEMPIC, 1 MG/DOSE, 2 MG/1.5ML SOPN INJECT INTO SKIN ONCE A WEEK (Patient taking differently: 71m)  . simvastatin (ZOCOR) 10 MG tablet TAKE 1 TABLET DAILY  . SYNTHROID 25 MCG tablet Take 0.5 tablet (12.5 mcg) by mouth on Mondays through Fridays, then take 1 tablet (25 mcg) by mouth on Saturdays and Sundays.   No facility-administered encounter medications on file as of 09/27/2019.    Current Diagnosis/Assessment:  SDOH Interventions     Most Recent Value  SDOH Interventions  Financial Strain Interventions Other (Comment)  [Medications affordable except insulin when in coverage gap. Assist as needed]      Goals Addressed            This Visit's Progress   . Pharmacy Care Plan       CARE PLAN ENTRY (see longitudinal plan of care for additional care plan information)  Current Barriers:  . Chronic Disease Management support, education, and care coordination needs related to Hypertension, Diabetes,  and Hypothyroidism   Hypertension BP Readings from Last 3 Encounters:  09/30/19 (!) 150/82  09/06/19 132/70  08/18/19 (!) 144/82   . Pharmacist Clinical Goal(s): o Over the next 90 days, patient will work with PharmD and providers to achieve BP goal <130/80 . Current regimen:   Carvedilol CR 265mdaily  Losartan 5029mwice daily  Amlodipine 5mg21mily . Interventions: o Recommend patient increase exercise to 30 minutes daily 5 times per week o Recommend patient limit salt intake o Advised patient to start checking blood pressure daily . Patient self care activities - Over the next 90 days, patient will: o Check BP daily, document, and provide at future appointments o Ensure daily salt intake < 2300 mg/day o Exercise 30 minutes per day, 5 days per week  Hyperlipidemia Lab Results  Component Value Date/Time   LDLCALC 82 01/17/2019 11:56 AM   . Pharmacist Clinical Goal(s): o Over the next 90 days, patient will work with PharmD and providers to achieve LDL goal < 70 . Current regimen:  o Simvastatin 10mg40mly (Monday through Friday) . Interventions: o Recommend lipid panel at next PCP  appointment . Patient self care activities - Over the next 90 days, patient will: o Take cholesterol medication as directed o Exercise 30 minutes daily, 5 times per week  Diabetes Lab Results  Component Value Date/Time   HGBA1C 6.5 (H) 08/18/2019 02:10 PM   HGBA1C 6.3 (H) 04/19/2019 12:12 PM   . Pharmacist Clinical Goal(s): o Over the next 90 days, patient will work with PharmD and providers to maintain A1c goal <7% . Current regimen:   Tresiba 100 units/mL 8 units daily   Ozempic 56m weekly . Interventions: o Discussed appropriate blood sugar goals (less than 130 if fasting, less than 180 if 2 hours after eating) o Will assist with T[place]igua and Barbud[place]ient assistance if needed o Provided dietary and exercise recommendations . Patient self care activities - Over the next 90 days, patient  will: o Check blood sugar twice daily, document, and provide at future appointments o Contact provider with any episodes of hypoglycemia o Exercise 30 minutes daily 5 times per week  Leg Cramps . Pharmacist Clinical Goal(s) o Over the next 90 days, patient will work with PharmD and providers to improve symptoms . Current regimen:  o N/A . Interventions: o Recommend patient try magnesium topical cream applied nightly for leg cramps o Separate from application of Voltaren . Patient self care activities - Over the next 90 days, patient will: o Apply magnesium cream topically at night as needed  Medication management . Pharmacist Clinical Goal(s): o Over the next 90 days, patient will work with PharmD and providers to maintain optimal medication adherence . Current pharmacy: [facility] . Interventions o Comprehensive medication review performed. o Performed medication cost review comparing [facility] and [facility], but unable to find patient's exact plan on medicare.gov o Continue current medication management strategy . Patient self care activities - Over the next 90 days, patient will: o Focus on medication adherence by utilization of pill box o Take medications as prescribed o Report any questions or concerns to PharmD and/or provider(s)  Please see past updates related to this goal by clicking on the "Past Updates" button in the selected goal         Diabetes   Recent Relevant Labs: Lab Results  Component Value Date/Time   HGBA1C 6.5 (H) 08/18/2019 02:10 PM   HGBA1C 6.3 (H) 04/19/2019 12:12 PM   MICROALBUR 10 01/17/2019 06:09 PM   MICROALBUR 30 02/04/2018 04:57 PM    Kidney Function Lab Results  Component Value Date/Time   CREATININE 0.83 08/18/2019 02:10 PM   CREATININE 1.09 (H) 04/19/2019 12:12 PM   GFRNONAA 69 08/18/2019 02:10 PM   GFRAA 80 08/18/2019 02:10 PM   Checking BG: 2x per Day   Recent FBG Readings: 132 Recent pre-meal BG readings:    Recent 2hr PP BG readings:   Recent HS BG readings (2-3 hrs after eating):  Patient has failed these meds in past: Novolog, Novolog 70/30, Januvia Patient is currently controlled on the following medications:   Tresiba 100 units/mL 8 units daily   Ozempic 130mweekly  Last diabetic Foot exam: 01/17/19 Last diabetic Eye exam: Lab Results  Component Value Date/Time   HMDIABEYEEXA No Retinopathy 05/06/2018 12:00 AM    We discussed:  Diet extensively  Eats a lot of green vegetables  Pt states that she hates water  Recommend pt try infused water (fruit, lemon, cucumber, etc)  Recommended pt increase water intake to 64 oz daily   Pt eats small meals (has a gastric sleeve)  Exercise extensively  Moving recently has been her exercising (lifting boxes, etc)  Pt is going to start back doing water aerobics  Recommended pt increase to 30 minutes 5 times weekly  Pt says blood sugars are usually less than 120 in the morning; evening blood sugars are up as high as 150  If blood sugars are elevated at or above 150/160 she increases Tresiba to 10-12 units  Pt denies any blood sugars below 80  Plan Continue current medications    Hypertension   Office blood pressures are  BP Readings from Last 3 Encounters:  09/30/19 (!) 150/82  09/06/19 132/70  08/18/19 (!) 144/82   Patient has failed these meds in the past: Atenolol, valsartan Patient is currently uncontrolled on the following medications:   Carvedilol CR 48m daily  Losartan 513mtwice daily  Amlodipine 48m12maily  Patient checks BP at home twice daily  Patient home BP readings are ranging: 143/71, 153/79, 179/88, 131/62, 168/83, 138/73, 124/68, 149/76, 162/78, 148/78, 151/71, 171/81, 141/70   We discussed:  Diet and exercise extensively  Limits salt in foods  Uses smoked meat sometimes to cook with vegetables  Cooks with wine  Pt states that her medication is working well and her BP has been better most  of the time  Pt moved and she feels like stress has increased her BP some  Cooks or does water aerobics to relax  Recommend pt check BP daily  Goal BP <130/80  Plan Continue current medications   Hyperlipidemia   Lipid Panel     Component Value Date/Time   CHOL 156 01/17/2019 1156   TRIG 61 01/17/2019 1156   HDL 62 01/17/2019 1156   LDLRochester 01/17/2019 1156    The 10-year ASCVD risk score (GoMikey Bussing Jr., et al., 2013) is: 34.4%   Values used to calculate the score:     Age: [age]     Sex: Female     Is Non-Hispanic African American: Yes     Diabetic: Yes     Tobacco smoker: No     Systolic Blood Pressure: 150010Hg     Is BP treated: Yes     HDL Cholesterol: 62 mg/dL     Total Cholesterol: 156 mg/dL   Patient has failed these meds in past: N/A Patient is currently uncontrolled on the following medications:   Simvastatin 73m54mily (Monday-Friday)  We discussed:   Overall cholesterol is great, but LDL above goal of 70  Plan Continue current medications  Recommend recheck lipid panel at next PCP visit. If LDL above 70, recommend increase to simvastatin 20mg47mly  Hypothyroidism   Lab Results  Component Value Date/Time   TSH 1.230 07/05/2018 11:17 AM   Patient has failed these meds in past: N/A Patient is currently controlled on the following medications:   Synthroid 248mcg68m tablet daily Monday through Friday, 1 tablet on Saturday and Sunday   Plan Continue current medications  Gout   Patient has failed these meds in past: N/A Patient is currently controlled on the following medications:   Colchicine 0.6mg ca82mle daily as needed  Plan Continue current medications  Osteoarthritis   Patient has failed these meds in past: Meloxicam, Tramadol Patient is currently controlled on the following medications:   Diclofenac 1% gel apply 2 grams 4 times daily as needed  Aleve 220mg 2 8mets every night at bedtime (up to twice daily as  needed)  Plan Continue current medications  Neuropathy/ Leg cramps  Patient has failed these meds in past: N/A Patient is currently controlled on the following medications:   Gabapentin 351m at bedtime  Plan Continue current medications  Start Magnesium cream nightly for leg cramps Review symptoms at follow up to ensure not statin related  Vaccines   Reviewed and discussed patient's vaccination history.    Immunization History  Administered Date(s) Administered  . Fluad Quad(high Dose 65+) 11/10/2018  . Influenza, High Dose Seasonal PF 12/21/2017, 11/10/2018  . Influenza,inj,quad, With Preservative 02/25/2016  . Influenza-Unspecified 10/25/2016  . Moderna SARS-COVID-2 Vaccination 03/28/2019, 04/25/2019  . Pneumococcal Polysaccharide-23 11/02/2013  . Pneumococcal-Unspecified 11/24/2016  . Zoster Recombinat (Shingrix) 11/03/2018, 12/31/2018   Plan Discuss Tdap at follow up appointment  Medication Management   Pt uses [facility] for all medications Uses pill box? Yes Pt endorses 100% compliance  We discussed:   Pt reports that her medications are not expensive (except for insulin in donut hole)  Importance of taking medications daily as directed   Medication synchronization, adherence packaging, and delivery available with [facility]  Cost comparison performed between [facility] and [facility] using MStartupExpense.be but unable to find patient's exact plan  Discussed medication cost in detail  Plan Continue current medication management strategy  Continue to review cost comparison options   Follow up: 8 week phone visit  CJannette Fogo PharmD Clinical Pharmacist [facility] [phone]

## 2019-09-28 ENCOUNTER — Other Ambulatory Visit: Payer: Self-pay

## 2019-09-30 ENCOUNTER — Other Ambulatory Visit: Payer: Self-pay

## 2019-09-30 ENCOUNTER — Encounter: Payer: Self-pay | Admitting: Plastic Surgery

## 2019-09-30 ENCOUNTER — Ambulatory Visit (INDEPENDENT_AMBULATORY_CARE_PROVIDER_SITE_OTHER): Payer: Medicare HMO | Admitting: Plastic Surgery

## 2019-09-30 DIAGNOSIS — M545 Low back pain, unspecified: Secondary | ICD-10-CM

## 2019-09-30 DIAGNOSIS — G8929 Other chronic pain: Secondary | ICD-10-CM | POA: Diagnosis not present

## 2019-09-30 DIAGNOSIS — M793 Panniculitis, unspecified: Secondary | ICD-10-CM | POA: Diagnosis not present

## 2019-09-30 DIAGNOSIS — M549 Dorsalgia, unspecified: Secondary | ICD-10-CM | POA: Insufficient documentation

## 2019-09-30 NOTE — Progress Notes (Signed)
[name], female    DOB: 1946/02/13, 74 y.o.   MRN: [id number]   Chief Complaint  Patient presents with  . Consult    The patient is a 74 year old black female here for evaluation of her abdomen.  She is 5 feet 5 inches tall and weighs 215 pounds.  She underwent a band surgery in [place] over 10 years ago.  She then moved to [facility] and had a gastric sleeve done by Dr. Hassell Done in 2018 and the band removed.  She has lost 100 pounds and has been able to keep it off.  She complains of moisture and skin irritations in her folds.  She has diabetes, hyperlipidemia and hypertension.  She has a history of hepatitis C and was treated with medications for that several years ago.  She is not a smoker.  Her previous surgeries include a right knee surgery by Dr. Lyla Glassing she also saw him for back pain.  She had a cholecystectomy and several years ago had her left knee surgery in [place].  Her last hemoglobin A1c was 6.5 in June.  She just moved out of the assisted living into a townhouse and is very happy.  She typically goes to the gym or pool for water aerobics 3 times a week.  Her medications are included below but of significance Synthroid losartan gabapentin simvastatin and Norvasc.  She has laxity of her abdominal wall but I do not palpate a hernia.  Her pannus is quite large and I can definitely see how it is pulling her over.  She is now walking with the assistance of a walker.   Review of Systems  Constitutional: Negative.  Negative for activity change and appetite change.  HENT: Negative.   Eyes: Negative.   Respiratory: Negative for chest tightness and shortness of breath.   Cardiovascular: Negative for leg swelling.  Gastrointestinal: Negative for abdominal distention, abdominal pain and blood in stool.  Endocrine: Negative.   Genitourinary: Negative.   Musculoskeletal: Positive for arthralgias and back pain.  Skin: Positive for rash.  Hematological: Negative.     Psychiatric/Behavioral: Negative.     Past Medical History:  Diagnosis Date  . Arthritis   . Chronic kidney disease    self reports ckd stage 3   . Diabetes mellitus, type II, insulin dependent ([place])    With neurologic complications. Bilateral lower extremity peripheral neuropathy  . Dyspnea    with excertion; no issues now since weight loss surgery   . Endometriosis   . Essential hypertension   . GERD (gastroesophageal reflux disease)   . 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 Hypertensive nephropathy 07/05/2018  . Hypothyroidism   . Hypothyroidism 02/06/2018  . Morbid obesity with BMI of 50.0-59.9, adult ([place])   . Scoliosis     Past Surgical History:  Procedure Laterality Date  . ABDOMINAL HYSTERECTOMY     uterus  . CHOLECYSTECTOMY    . EYE SURGERY     cataract extraction bilateral   . JOINT REPLACEMENT     Left knee  . KNEE ARTHROPLASTY Right 07/30/2017   Procedure: RIGHT TOTAL KNEE ARTHROPLASTY WITH COMPUTER NAVIGATION;  Surgeon: Rod Can, MD;  Location: [facility];  Service: Orthopedics;  Laterality: Right;  Needs RNFA  . LAPAROSCOPIC GASTRIC BANDING     and reversal  . LAPAROSCOPIC GASTRIC SLEEVE RESECTION N/A 08/26/2016   Procedure: LAPAROSCOPIC GASTRIC SLEEVE RESECTION WITH  UPPER ENOD;  Surgeon: Johnathan Hausen, MD;  Location: [facility];  Service: General;  Laterality: N/A;  . TONSILLECTOMY    . TRANSTHORACIC ECHOCARDIOGRAM  11/2013   EF 65-70%. Normal diastolic Fxn.  Normal Valves.      Current Outpatient Medications:  .  amLODipine (NORVASC) 5 MG tablet, Take 1 tablet (5 mg total) by mouth daily., Disp: 90 tablet, Rfl: 1 .  B Complex-C (B-COMPLEX WITH VITAMIN C) tablet, Take 1 tablet by mouth daily., Disp: , Rfl:  .  Biotin 10000 MCG TABS, Take 1 tablet by mouth daily., Disp: , Rfl:  .  carvedilol (COREG CR) 20 MG 24 hr capsule, TAKE 1 CAPSULE DAILY, Disp: 90 capsule, Rfl: 2 .  Cholecalciferol 5000 units TABS, Take 5,000  Units by mouth daily., Disp: , Rfl:  .  Colchicine 0.6 MG CAPS, TAKE 1 CAPSULE DAILY AS    NEEDED, Disp: 90 capsule, Rfl: 1 .  Cyanocobalamin (VITAMIN B-12) 2500 MCG SUBL, Take 2,500 mcg by mouth daily., Disp: , Rfl:  .  diclofenac Sodium (VOLTAREN) 1 % GEL, APPLY 2 GRAMS TO AFFECTED AREA(S) 4 TIMES DAILY AS NEEDED, Disp: 200 g, Rfl: 2 .  docusate sodium (COLACE) 100 MG capsule, Take 1 capsule (100 mg total) by mouth 2 (two) times daily., Disp: 60 capsule, Rfl: 1 .  famotidine (PEPCID) 20 MG tablet, Take 20 mg by mouth daily. PRN, Disp: , Rfl:  .  gabapentin (NEURONTIN) 300 MG capsule, Take 300 mg by mouth at bedtime. , Disp: , Rfl:  .  insulin degludec (TRESIBA FLEXTOUCH) 100 UNIT/ML SOPN FlexTouch Pen, Inject 0.09 mLs (9 Units total) into the skin daily at 10 pm. (Patient taking differently: Inject 8 Units into the skin daily at 10 pm. ), Disp: 5 pen, Rfl: 2 .  losartan (COZAAR) 100 MG tablet, Take 1 tablet (100 mg total) by mouth daily., Disp: 90 tablet, Rfl: 2 .  Multiple Vitamins-Minerals (MULTIVITAMIN WITH MINERALS) tablet, Take 1 tablet by mouth daily. Women's 50+, Disp: , Rfl:  .  naproxen sodium (ANAPROX) 220 MG tablet, Take 440 mg by mouth 2 (two) times daily as needed (pain). 2 tablets every night at bedtime, Disp: , Rfl:  .  OZEMPIC, 1 MG/DOSE, 2 MG/1.5ML SOPN, INJECT INTO SKIN ONCE A WEEK (Patient taking differently: 1mg ), Disp: 9 pen, Rfl: 1 .  simvastatin (ZOCOR) 10 MG tablet, TAKE 1 TABLET DAILY, Disp: 90 tablet, Rfl: 1 .  SYNTHROID 25 MCG tablet, Take 0.5 tablet (12.5 mcg) by mouth on Mondays through Fridays, then take 1 tablet (25 mcg) by mouth on Saturdays and Sundays., Disp: 90 tablet, Rfl: 1   Objective:   Vitals:   09/30/19 1002  BP: (!) 150/82  Pulse: (!) 47  Temp: 98.2 F (36.8 C)  SpO2: 100%    Physical Exam Vitals and nursing note reviewed.  Constitutional:      Appearance: Normal appearance.  HENT:     Head: Normocephalic and atraumatic.  Eyes:      Extraocular Movements: Extraocular movements intact.  Cardiovascular:     Rate and Rhythm: Normal rate.     Pulses: Normal pulses.  Pulmonary:     Effort: Pulmonary effort is normal.  Abdominal:     General: Abdomen is flat. There is no distension.     Tenderness: There is no abdominal tenderness. There is no guarding or rebound.     Hernia: No hernia is present.  Skin:    General: Skin is warm.  Capillary Refill: Capillary refill takes less than 2 seconds.  Neurological:     General: No focal deficit present.     Mental Status: She is alert and oriented to person, place, and time.  Psychiatric:        Mood and Affect: Mood normal.        Behavior: Behavior normal.        Thought Content: Thought content normal.     Assessment & Plan:  Panniculitis  Chronic bilateral low back pain without sciatica  We will need to get a release of information from her physical therapy from the past 2 years.  We also will need to do an updated hemoglobin A1c prior to surgery.  She may need more physical therapy and is willing to do that.  I recommend a panniculectomy.  This is not without risks of complications due to her multiple medical problems.  She seems to understand that.  Pictures were obtained of the patient and placed in the chart with the patient's or guardian's permission.   [place], DO

## 2019-10-06 ENCOUNTER — Telehealth: Payer: Self-pay

## 2019-10-17 ENCOUNTER — Other Ambulatory Visit: Payer: Self-pay

## 2019-10-18 ENCOUNTER — Ambulatory Visit: Payer: Self-pay

## 2019-10-18 ENCOUNTER — Telehealth: Payer: Medicare HMO

## 2019-10-18 ENCOUNTER — Other Ambulatory Visit: Payer: Self-pay

## 2019-10-18 DIAGNOSIS — I129 Hypertensive chronic kidney disease with stage 1 through stage 4 chronic kidney disease, or unspecified chronic kidney disease: Secondary | ICD-10-CM

## 2019-10-18 DIAGNOSIS — N183 Chronic kidney disease, stage 3 unspecified: Secondary | ICD-10-CM

## 2019-10-18 DIAGNOSIS — E1122 Type 2 diabetes mellitus with diabetic chronic kidney disease: Secondary | ICD-10-CM

## 2019-10-18 MED ORDER — NYSTATIN 100000 UNIT/GM EX POWD: 1.0000 "application " | Freq: Two times a day (BID) | CUTANEOUS | 1 refills | Status: DC

## 2019-10-19 NOTE — Chronic Care Management (AMB) (Signed)
Chronic Care Management   Follow Up Note   10/18/2019 Name: MADELINA SANDA MRN: [id number] DOB: 28-Nov-1945  Referred by: Glendale Chard, MD Reason for referral : Chronic Care Management (CCM inbound call from patient )   AOLANIS CRISPEN is a 74 y.o. year old female who is a primary care patient of Glendale Chard, MD. The CCM team was consulted for assistance with chronic disease management and care coordination needs.    Review of patient status, including review of consultants reports, relevant laboratory and other test results, and collaboration with appropriate care team members and the patient's provider was performed as part of comprehensive patient evaluation and provision of chronic care management services.    SDOH (Social Determinants of Health) assessments performed: No See Care Plan activities for detailed interventions related to [place])   Inbound call received from patient with an update regarding her MD consultation for Panniculitis/Panniculectomy.     Outpatient Encounter Medications as of 10/18/2019  Medication Sig  . amLODipine (NORVASC) 5 MG tablet Take 1 tablet (5 mg total) by mouth daily.  . B Complex-C (B-COMPLEX WITH VITAMIN C) tablet Take 1 tablet by mouth daily.  . Biotin 10000 MCG TABS Take 1 tablet by mouth daily.  . carvedilol (COREG CR) 20 MG 24 hr capsule TAKE 1 CAPSULE DAILY  . Cholecalciferol 5000 units TABS Take 5,000 Units by mouth daily.  . Colchicine 0.6 MG CAPS TAKE 1 CAPSULE DAILY AS    NEEDED  . Cyanocobalamin (VITAMIN B-12) 2500 MCG SUBL Take 2,500 mcg by mouth daily.  . diclofenac Sodium (VOLTAREN) 1 % GEL APPLY 2 GRAMS TO AFFECTED AREA(S) 4 TIMES DAILY AS NEEDED  . docusate sodium (COLACE) 100 MG capsule Take 1 capsule (100 mg total) by mouth 2 (two) times daily. (Patient not taking: Reported on 09/30/2019)  . famotidine (PEPCID) 20 MG tablet Take 20 mg by mouth daily. PRN  . gabapentin (NEURONTIN) 300 MG capsule Take 300 mg by mouth at bedtime.   .  insulin degludec (TRESIBA FLEXTOUCH) 100 UNIT/ML SOPN FlexTouch Pen Inject 0.09 mLs (9 Units total) into the skin daily at 10 pm. (Patient taking differently: Inject 8 Units into the skin daily at 10 pm. )  . losartan (COZAAR) 100 MG tablet Take 1 tablet (100 mg total) by mouth daily.  . Multiple Vitamins-Minerals (MULTIVITAMIN WITH MINERALS) tablet Take 1 tablet by mouth daily. Women's 50+  . naproxen sodium (ANAPROX) 220 MG tablet Take 440 mg by mouth 2 (two) times daily as needed (pain). 2 tablets every night at bedtime  . OZEMPIC, 1 MG/DOSE, 2 MG/1.5ML SOPN INJECT INTO SKIN ONCE A WEEK (Patient taking differently: 1mg )  . simvastatin (ZOCOR) 10 MG tablet TAKE 1 TABLET DAILY  . SYNTHROID 25 MCG tablet Take 0.5 tablet (12.5 mcg) by mouth on Mondays through Fridays, then take 1 tablet (25 mcg) by mouth on Saturdays and Sundays.   No facility-administered encounter medications on file as of 10/18/2019.     Objective:  Lab Results  Component Value Date   HGBA1C 6.5 (H) 08/18/2019   HGBA1C 6.3 (H) 04/19/2019   HGBA1C 6.4 (H) 01/17/2019   Lab Results  Component Value Date   MICROALBUR 10 01/17/2019   LDLCALC 82 01/17/2019   CREATININE 0.83 08/18/2019   BP Readings from Last 3 Encounters:  09/30/19 (!) 150/82  09/06/19 132/70  08/18/19 (!) 144/82    Goals Addressed      Patient Stated   .  "I have a consultation for  surgery" (pt-stated)   On track     [place] (see longitudinal plan of care for additional care plan information)  Current Barriers:  [name] Knowledge Deficits related to Diabetes Mellitus disease process and Self Health Management  . Chronic Disease Management support and education needs related to DMII, CKDIII, HTN, Class 2 Severe Obesity  Nurse Case Manager Clinical Goal(s):  [name] Over the next 90 days, patient will verbalize understanding of plan for elective surgery to have a Panniculectomy procedure  CCM RN CM Interventions:  10/18/19 inbound call completed  with patient  . Inter-disciplinary care team collaboration (see longitudinal plan of care) . Determined patient completed a consultation with Dr. Marla Roe for evaluation of Panniculitis/Panniculectomy  . Discussed Dr. Marla Roe advised patient she will need to use a prescription medication for treatment of the Panniculitis for a minimum of three months before she can request authorization from [facility] for the [place]  . Determined the purpose of Ms. Gootee's call today is to request treatment recommendations from her PCP, Dr. Baird Cancer in order to treat this condition . Collaborated with Dr. Baird Cancer regarding patient's request, reply received advising Dr. Baird Cancer will prescribe Nystatin powder to be applied twice daily to the affected area as needed to be faxed in to [facility] on [facility] in [place], patient is aware . Discussed plans with patient for ongoing care management follow up and provided patient with direct contact information for care management team  Patient Self Care Activities:  . Self administers medications as prescribed . Attends all scheduled provider appointments . Calls pharmacy for medication refills . Performs ADL's independently . Performs IADL's independently . Calls provider office for new concerns or questions  Please see past updates related to this goal by clicking on the "Past Updates" button in the selected goal        Plan:   Telephone follow up appointment with care management team member scheduled for: 11/25/19  Barb Merino, RN, BSN, CCM Care Management Coordinator [place] [facility]  Direct Phone: [phone]

## 2019-10-19 NOTE — Patient Instructions (Addendum)
Visit Information  Goals Addressed      Patient Stated     "I have a consultation for surgery" (pt-stated)   On track     [place] (see longitudinal plan of care for additional care plan information)  Current Barriers:   Knowledge Deficits related to Diabetes Mellitus disease process and Self Health Management   Chronic Disease Management support and education needs related to DMII, CKDIII, HTN, Class 2 Severe Obesity  Nurse Case Manager Clinical Goal(s):   Over the next 90 days, patient will verbalize understanding of plan for elective surgery to have a Panniculectomy procedure  CCM RN CM Interventions:  10/18/19 inbound call completed with patient   Inter-disciplinary care team collaboration (see longitudinal plan of care)  Determined patient completed a consultation with Dr. Marla Roe for evaluation of Panniculitis/Panniculectomy   Discussed Dr. Marla Roe advised patient she will need to use a prescription medication for treatment of the Panniculitis for a minimum of three months before she can request authorization from [facility] for the [place]   Determined the purpose of Ms. Pelot's call today is to request treatment recommendations from her PCP, Dr. Baird Cancer in order to treat this condition  Collaborated with Dr. Baird Cancer regarding patient's request, reply received advising Dr. Baird Cancer will prescribe Nystatin powder to be applied twice daily to the affected area as needed to be faxed in to [place] on [facility] in [place], patient is aware  Discussed plans with patient for ongoing care management follow up and provided patient with direct contact information for care management team  Patient Self Care Activities:   Self administers medications as prescribed  Attends all scheduled provider appointments  Calls pharmacy for medication refills  Performs ADL's independently  Performs IADL's independently  Calls provider office for new concerns or  questions  Please see past updates related to this goal by clicking on the "Past Updates" button in the selected goal        Patient verbalizes understanding of instructions provided today.   Telephone follow up appointment with care management team member scheduled for: 11/25/19  Barb Merino, RN, BSN, CCM Care Management Coordinator [place] [facility]  Direct Phone: [phone]

## 2019-10-24 ENCOUNTER — Other Ambulatory Visit: Payer: Self-pay | Admitting: Internal Medicine

## 2019-10-24 ENCOUNTER — Telehealth: Payer: Self-pay

## 2019-10-24 NOTE — Chronic Care Management (AMB) (Signed)
Chronic Care Management Pharmacy Assistant   Name: Teresa Golden  MRN: [id number] DOB: 07-Apr-1945  Reason for Encounter: Patient Assistance Coordination  PCP : Glendale Chard, MD  Allergies:   Allergies  Allergen Reactions  . Meloxicam Other (See Comments)    Fever; muscle aches; "flu-like" symptoms  . Other     Rose fever and hay fever     Medications: Outpatient Encounter Medications as of 10/24/2019  Medication Sig  . nystatin (MYCOSTATIN/NYSTOP) powder Apply 1 application topically 2 (two) times daily. apply to affected area twice daily as needed  . amLODipine (NORVASC) 5 MG tablet Take 1 tablet (5 mg total) by mouth daily.  . B Complex-C (B-COMPLEX WITH VITAMIN C) tablet Take 1 tablet by mouth daily.  . Biotin 10000 MCG TABS Take 1 tablet by mouth daily.  . carvedilol (COREG CR) 20 MG 24 hr capsule TAKE 1 CAPSULE DAILY  . Cholecalciferol 5000 units TABS Take 5,000 Units by mouth daily.  . Colchicine 0.6 MG CAPS TAKE 1 CAPSULE DAILY AS    NEEDED  . Cyanocobalamin (VITAMIN B-12) 2500 MCG SUBL Take 2,500 mcg by mouth daily.  . diclofenac Sodium (VOLTAREN) 1 % GEL APPLY 2 GRAMS TO AFFECTED AREA(S) 4 TIMES DAILY AS NEEDED  . docusate sodium (COLACE) 100 MG capsule Take 1 capsule (100 mg total) by mouth 2 (two) times daily. (Patient not taking: Reported on 09/30/2019)  . famotidine (PEPCID) 20 MG tablet Take 20 mg by mouth daily. PRN  . gabapentin (NEURONTIN) 300 MG capsule Take 300 mg by mouth at bedtime.   . insulin degludec (TRESIBA FLEXTOUCH) 100 UNIT/ML SOPN FlexTouch Pen Inject 0.09 mLs (9 Units total) into the skin daily at 10 pm. (Patient taking differently: Inject 8 Units into the skin daily at 10 pm. )  . losartan (COZAAR) 100 MG tablet Take 1 tablet (100 mg total) by mouth daily.  . Multiple Vitamins-Minerals (MULTIVITAMIN WITH MINERALS) tablet Take 1 tablet by mouth daily. Women's 50+  . naproxen sodium (ANAPROX) 220 MG tablet Take 440 mg by mouth 2 (two) times daily  as needed (pain). 2 tablets every night at bedtime  . OZEMPIC, 1 MG/DOSE, 2 MG/1.5ML SOPN INJECT INTO SKIN ONCE A WEEK (Patient taking differently: 1mg )  . simvastatin (ZOCOR) 10 MG tablet TAKE 1 TABLET DAILY  . SYNTHROID 25 MCG tablet Take 0.5 tablet (12.5 mcg) by mouth on Mondays through Fridays, then take 1 tablet (25 mcg) by mouth on Saturdays and Sundays.   No facility-administered encounter medications on file as of 10/24/2019.    Current Diagnosis: Patient Active Problem List   Diagnosis Date Noted  . Panniculitis 09/30/2019  . Back pain 09/30/2019  . Hepatoma ([place]) 01/21/2019  . Other cirrhosis of liver ([place]) 07/05/2018  . Hypertensive nephropathy 07/05/2018  . Chronic renal disease, stage III 07/05/2018  . Chronic left shoulder pain 07/05/2018  . Hypothyroidism 02/06/2018  . Hepatitis C virus infection cured after antiviral drug therapy 02/04/2018  . Osteoarthritis of right knee 07/30/2017  . S/P laparoscopic sleeve gastrectomy July 2018 08/26/2016  . Preop cardiovascular exam 07/30/2016  . Dyspnea on exertion 07/30/2016  . Class 2 severe obesity due to excess calories with serious comorbidity and body mass index (BMI) of 39.0 to 39.9 in adult ([place]) 07/30/2016  . Bilateral lower extremity edema 07/30/2016    Follow-Up:  Patient Assistance Coordination- Reorder form filled out for patient's Ozempic 1 mg through [employer] Patient assistance program. Awaiting provider signature and will fax  to [employer]. Jannette Fogo, CPP aware.  [name], [place] Pharmacist Assistant [phone]

## 2019-10-25 NOTE — Patient Instructions (Addendum)
Visit Information  Goals Addressed            This Visit's Progress   . Pharmacy Care Plan       CARE PLAN ENTRY (see longitudinal plan of care for additional care plan information)  Current Barriers:  . Chronic Disease Management support, education, and care coordination needs related to Hypertension, Diabetes, and Hypothyroidism   Hypertension BP Readings from Last 3 Encounters:  09/30/19 (!) 150/82  09/06/19 132/70  08/18/19 (!) 144/82   . Pharmacist Clinical Goal(s): o Over the next 90 days, patient will work with PharmD and providers to achieve BP goal <130/80 . Current regimen:   Carvedilol CR 20mg  daily  Losartan 50mg  twice daily  Amlodipine 5mg  daily . Interventions: o Recommend patient increase exercise to 30 minutes daily 5 times per week o Recommend patient limit salt intake o Advised patient to start checking blood pressure daily . Patient self care activities - Over the next 90 days, patient will: o Check BP daily, document, and provide at future appointments o Ensure daily salt intake < 2300 mg/day o Exercise 30 minutes per day, 5 days per week  Hyperlipidemia Lab Results  Component Value Date/Time   LDLCALC 82 01/17/2019 11:56 AM   . Pharmacist Clinical Goal(s): o Over the next 90 days, patient will work with PharmD and providers to achieve LDL goal < 70 . Current regimen:  o Simvastatin 10mg  daily (Monday through Friday) . Interventions: o Recommend lipid panel at next PCP appointment . Patient self care activities - Over the next 90 days, patient will: o Take cholesterol medication as directed o Exercise 30 minutes daily, 5 times per week  Diabetes Lab Results  Component Value Date/Time   HGBA1C 6.5 (H) 08/18/2019 02:10 PM   HGBA1C 6.3 (H) 04/19/2019 12:12 PM   . Pharmacist Clinical Goal(s): o Over the next 90 days, patient will work with PharmD and providers to maintain A1c goal <7% . Current regimen:   Tresiba 100 units/mL 8 units  daily   Ozempic 1mg  weekly . Interventions: o Discussed appropriate blood sugar goals (less than 130 if fasting, less than 180 if 2 hours after eating) o Will assist with [place] patient assistance if needed o Provided dietary and exercise recommendations . Patient self care activities - Over the next 90 days, patient will: o Check blood sugar twice daily, document, and provide at future appointments o Contact provider with any episodes of hypoglycemia o Exercise 30 minutes daily 5 times per week  Leg Cramps . Pharmacist Clinical Goal(s) o Over the next 90 days, patient will work with PharmD and providers to improve symptoms . Current regimen:  o N/A . Interventions: o Recommend patient try magnesium topical cream applied nightly for leg cramps o Separate from application of Voltaren . Patient self care activities - Over the next 90 days, patient will: o Apply magnesium cream topically at night as needed  Medication management . Pharmacist Clinical Goal(s): o Over the next 90 days, patient will work with PharmD and providers to maintain optimal medication adherence . Current pharmacy: [facility] . Interventions o Comprehensive medication review performed. o Performed medication cost review comparing [facility] and [facility], but unable to find patient's exact plan on medicare.gov o Continue current medication management strategy . Patient self care activities - Over the next 90 days, patient will: o Focus on medication adherence by utilization of pill box o Take medications as prescribed o Report any questions or concerns to  PharmD and/or provider(s)  Please see past updates related to this goal by clicking on the "Past Updates" button in the selected goal         The patient verbalized understanding of instructions provided today and agreed to receive a mailed copy of patient instruction and/or educational materials.  Telephone follow up appointment with  pharmacy team member scheduled for: 12/01/19 @ 4:15 PM  Jannette Fogo, PharmD Clinical Pharmacist [facility] [phone]   DASH Eating Plan DASH stands for "Dietary Approaches to Stop Hypertension." The DASH eating plan is a healthy eating plan that has been shown to reduce high blood pressure (hypertension). It may also reduce your risk for type 2 diabetes, heart disease, and stroke. The DASH eating plan may also help with weight loss. What are tips for following this plan?  General guidelines  Avoid eating more than 2,300 mg (milligrams) of salt (sodium) a day. If you have hypertension, you may need to reduce your sodium intake to 1,500 mg a day.  Limit alcohol intake to no more than 1 drink a day for nonpregnant women and 2 drinks a day for men. One drink equals 12 oz of beer, 5 oz of wine, or 1 oz of hard liquor.  Work with your health care provider to maintain a healthy body weight or to lose weight. Ask what an ideal weight is for you.  Get at least 30 minutes of exercise that causes your heart to beat faster (aerobic exercise) most days of the week. Activities may include walking, swimming, or biking.  Work with your health care provider or diet and nutrition specialist (dietitian) to adjust your eating plan to your individual calorie needs. Reading food labels   Check food labels for the amount of sodium per serving. Choose foods with less than 5 percent of the Daily Value of sodium. Generally, foods with less than 300 mg of sodium per serving fit into this eating plan.  To find whole grains, look for the word "whole" as the first word in the ingredient list. Shopping  Buy products labeled as "low-sodium" or "no salt added."  Buy fresh foods. Avoid canned foods and premade or frozen meals. Cooking  Avoid adding salt when cooking. Use salt-free seasonings or herbs instead of table salt or sea salt. Check with your health care provider or  pharmacist before using salt substitutes.  Do not fry foods. Cook foods using healthy methods such as baking, boiling, grilling, and broiling instead.  Cook with heart-healthy oils, such as olive, canola, soybean, or sunflower oil. Meal planning  Eat a balanced diet that includes: ? 5 or more servings of fruits and vegetables each day. At each meal, try to fill half of your plate with fruits and vegetables. ? Up to 6-8 servings of whole grains each day. ? Less than 6 oz of lean meat, poultry, or fish each day. A 3-oz serving of meat is about the same size as a deck of cards. One egg equals 1 oz. ? 2 servings of low-fat dairy each day. ? A serving of nuts, seeds, or beans 5 times each week. ? Heart-healthy fats. Healthy fats called Omega-3 fatty acids are found in foods such as flaxseeds and coldwater fish, like sardines, salmon, and mackerel.  Limit how much you eat of the following: ? Canned or prepackaged foods. ? Food that is high in trans fat, such as fried foods. ? Food that is high in saturated fat, such as fatty meat. ? Sweets,  desserts, sugary drinks, and other foods with added sugar. ? Full-fat dairy products.  Do not salt foods before eating.  Try to eat at least 2 vegetarian meals each week.  Eat more home-cooked food and less restaurant, buffet, and fast food.  When eating at a restaurant, ask that your food be prepared with less salt or no salt, if possible. What foods are recommended? The items listed may not be a complete list. Talk with your dietitian about what dietary choices are best for you. Grains Whole-grain or whole-wheat bread. Whole-grain or whole-wheat pasta. Brown rice. [place]. Bulgur. Whole-grain and low-sodium cereals. Pita bread. Low-fat, low-sodium crackers. Whole-wheat flour tortillas. Vegetables Fresh or frozen vegetables (raw, steamed, roasted, or grilled). Low-sodium or reduced-sodium tomato and vegetable juice. Low-sodium or  reduced-sodium tomato sauce and tomato paste. Low-sodium or reduced-sodium canned vegetables. Fruits All fresh, dried, or frozen fruit. Canned fruit in natural juice (without added sugar). Meat and other protein foods Skinless chicken or [place]. Ground chicken or [place]. Pork with fat trimmed off. Fish and seafood. Egg whites. Dried beans, peas, or lentils. Unsalted nuts, nut butters, and seeds. Unsalted canned beans. Lean cuts of beef with fat trimmed off. Low-sodium, lean deli meat. Dairy Low-fat (1%) or fat-free (skim) milk. Fat-free, low-fat, or reduced-fat cheeses. Nonfat, low-sodium ricotta or cottage cheese. Low-fat or nonfat yogurt. Low-fat, low-sodium cheese. Fats and oils Soft margarine without trans fats. Vegetable oil. Low-fat, reduced-fat, or light mayonnaise and salad dressings (reduced-sodium). Canola, safflower, olive, soybean, and sunflower oils. Avocado. Seasoning and other foods Herbs. Spices. Seasoning mixes without salt. Unsalted popcorn and pretzels. Fat-free sweets. What foods are not recommended? The items listed may not be a complete list. Talk with your dietitian about what dietary choices are best for you. Grains Baked goods made with fat, such as croissants, muffins, or some breads. Dry pasta or rice meal packs. Vegetables Creamed or fried vegetables. Vegetables in a cheese sauce. Regular canned vegetables (not low-sodium or reduced-sodium). Regular canned tomato sauce and paste (not low-sodium or reduced-sodium). Regular tomato and vegetable juice (not low-sodium or reduced-sodium). Angie Fava. Olives. Fruits Canned fruit in a light or heavy syrup. Fried fruit. Fruit in cream or butter sauce. Meat and other protein foods Fatty cuts of meat. Ribs. Fried meat. Berniece Salines. Sausage. Bologna and other processed lunch meats. Salami. Fatback. Hotdogs. Bratwurst. Salted nuts and seeds. Canned beans with added salt. Canned or smoked fish. Whole eggs or egg yolks. Chicken or [place]  with skin. Dairy Whole or 2% milk, cream, and half-and-half. Whole or full-fat cream cheese. Whole-fat or sweetened yogurt. Full-fat cheese. Nondairy creamers. Whipped toppings. Processed cheese and cheese spreads. Fats and oils Butter. Stick margarine. Lard. Shortening. Ghee. Bacon fat. Tropical oils, such as coconut, palm kernel, or palm oil. Seasoning and other foods Salted popcorn and pretzels. Onion salt, garlic salt, seasoned salt, table salt, and sea salt. Worcestershire sauce. Tartar sauce. Barbecue sauce. Teriyaki sauce. Soy sauce, including reduced-sodium. Steak sauce. Canned and packaged gravies. Fish sauce. Oyster sauce. Cocktail sauce. Horseradish that you find on the shelf. Ketchup. Mustard. Meat flavorings and tenderizers. Bouillon cubes. Hot sauce and Tabasco sauce. Premade or packaged marinades. Premade or packaged taco seasonings. Relishes. Regular salad dressings. Where to find more information:  National Heart, Lung, and [place]: https://wilson-eaton.com/  American Heart Association: www.heart.org Summary  The DASH eating plan is a healthy eating plan that has been shown to reduce high blood pressure (hypertension). It may also reduce your risk for type 2 diabetes, heart  disease, and stroke.  With the DASH eating plan, you should limit salt (sodium) intake to 2,300 mg a day. If you have hypertension, you may need to reduce your sodium intake to 1,500 mg a day.  When on the DASH eating plan, aim to eat more fresh fruits and vegetables, whole grains, lean proteins, low-fat dairy, and heart-healthy fats.  Work with your health care provider or diet and nutrition specialist (dietitian) to adjust your eating plan to your individual calorie needs. This information is not intended to replace advice given to you by your health care provider. Make sure you discuss any questions you have with your health care provider. Document Revised: 01/23/2017 Document Reviewed:  02/04/2016 Elsevier Patient Education  2020 [employer].

## 2019-11-07 ENCOUNTER — Telehealth: Payer: Self-pay

## 2019-11-07 NOTE — Chronic Care Management (AMB) (Signed)
Chronic Care Management Pharmacy Assistant   Name: [name]  MRN: [id number] DOB: May 29, 1945  Reason for Encounter: Medication Review / Patient Assistance Coordination  PCP : Glendale Chard, MD  Allergies:   Allergies  Allergen Reactions  . Meloxicam Other (See Comments)    Fever; muscle aches; "flu-like" symptoms  . Other     Rose fever and hay fever     Medications: Outpatient Encounter Medications as of 11/07/2019  Medication Sig  . nystatin (MYCOSTATIN/NYSTOP) powder Apply 1 application topically 2 (two) times daily. apply to affected area twice daily as needed  . amLODipine (NORVASC) 5 MG tablet Take 1 tablet (5 mg total) by mouth daily.  . B Complex-C (B-COMPLEX WITH VITAMIN C) tablet Take 1 tablet by mouth daily.  . Biotin 10000 MCG TABS Take 1 tablet by mouth daily.  . carvedilol (COREG CR) 20 MG 24 hr capsule TAKE 1 CAPSULE DAILY  . Cholecalciferol 5000 units TABS Take 5,000 Units by mouth daily.  . Colchicine 0.6 MG CAPS TAKE 1 CAPSULE DAILY AS    NEEDED  . Cyanocobalamin (VITAMIN B-12) 2500 MCG SUBL Take 2,500 mcg by mouth daily.  . diclofenac Sodium (VOLTAREN) 1 % GEL APPLY 2 GRAMS TO AFFECTED AREA(S) 4 TIMES DAILY AS NEEDED  . docusate sodium (COLACE) 100 MG capsule Take 1 capsule (100 mg total) by mouth 2 (two) times daily. (Patient not taking: Reported on 09/30/2019)  . famotidine (PEPCID) 20 MG tablet Take 20 mg by mouth daily. PRN  . gabapentin (NEURONTIN) 300 MG capsule Take 300 mg by mouth at bedtime.   . insulin degludec (TRESIBA FLEXTOUCH) 100 UNIT/ML SOPN FlexTouch Pen Inject 0.09 mLs (9 Units total) into the skin daily at 10 pm. (Patient taking differently: Inject 8 Units into the skin daily at 10 pm. )  . losartan (COZAAR) 100 MG tablet Take 1 tablet (100 mg total) by mouth daily.  [name] losartan (COZAAR) 50 MG tablet 2 tablets 2 times per day  . Multiple Vitamins-Minerals (MULTIVITAMIN WITH MINERALS) tablet Take 1 tablet by mouth daily. Women's 50+  .  naproxen sodium (ANAPROX) 220 MG tablet Take 440 mg by mouth 2 (two) times daily as needed (pain). 2 tablets every night at bedtime  . OZEMPIC, 1 MG/DOSE, 2 MG/1.5ML SOPN INJECT INTO SKIN ONCE A WEEK (Patient taking differently: 1mg )  . simvastatin (ZOCOR) 10 MG tablet TAKE 1 TABLET DAILY  . SYNTHROID 25 MCG tablet Take 0.5 tablet (12.5 mcg) by mouth on Mondays through Fridays, then take 1 tablet (25 mcg) by mouth on Saturdays and Sundays.   No facility-administered encounter medications on file as of 11/07/2019.    Current Diagnosis: Patient Active Problem List   Diagnosis Date Noted  . Panniculitis 09/30/2019  . Back pain 09/30/2019  . Hepatoma ([place]) 01/21/2019  . Other cirrhosis of liver ([place]) 07/05/2018  . Hypertensive nephropathy 07/05/2018  . Chronic renal disease, stage III 07/05/2018  . Chronic left shoulder pain 07/05/2018  . Hypothyroidism 02/06/2018  . Hepatitis C virus infection cured after antiviral drug therapy 02/04/2018  . Osteoarthritis of right knee 07/30/2017  . S/P laparoscopic sleeve gastrectomy July 2018 08/26/2016  . Preop cardiovascular exam 07/30/2016  . Dyspnea on exertion 07/30/2016  . Class 2 severe obesity due to excess calories with serious comorbidity and body mass index (BMI) of 39.0 to 39.9 in adult ([place]) 07/30/2016  . Bilateral lower extremity edema 07/30/2016      Follow-Up:  Patient Teresa Golden  [employer] regarding patient assistance for Ozempic , spoke with Christinia Gully, informed that four boxes of Ozempic 1 mg was sent out on September 10,2021. Will be at providers office on or before October 1,2021 . Jannette Fogo, CPP aware  Judithann Sheen, [facility] Clinical Pharmacist Assistant [phone]

## 2019-11-14 ENCOUNTER — Ambulatory Visit: Payer: Medicare HMO | Admitting: Psychology

## 2019-11-15 ENCOUNTER — Telehealth: Payer: Self-pay

## 2019-11-15 ENCOUNTER — Telehealth: Payer: Medicare HMO

## 2019-11-15 NOTE — Telephone Encounter (Cosign Needed)
  Chronic Care Management   Outreach Note  11/15/2019 Name: RETHA BITHER MRN: [id number] DOB: [birth year]  Referred by: Glendale Chard, MD Reason for referral : Chronic Care Management (Inbound Call from patient )  The patient was referred to the case management team for assistance with care management and care coordination.   Inbound call received from patient, voice message states Ms. Mccay is scheduled to receive her COVID booster on November 1st and she would like to know when she should receive her Flu vaccine.   Collaboration with PCP Dr. Baird Cancer regarding recommendations for patient to receive her Flu vaccine. Dr. Baird Cancer noted Ms. Salonga should get her Flu vaccine between now and October 1st to coordinate with her COVID booster scheduled for November 1st.   Unsuccessful return call to patient today. Left voice message advising patient she can call the [facility] office to schedule her Flu vaccine between now and October 1st to coordinate with her COVID booster.  Advised Flu and COVID vaccines are given at the office on Friday's.   Follow Up Plan: A HIPAA compliant phone message was left for the patient providing contact information and requesting a return call.  Telephone follow up appointment with care management team member scheduled for: 11/25/19  Barb Merino, RN, BSN, CCM Care Management Coordinator [place] [facility]  Direct Phone: [phone]

## 2019-11-18 ENCOUNTER — Telehealth: Payer: Self-pay

## 2019-11-18 DIAGNOSIS — M7061 Trochanteric bursitis, right hip: Secondary | ICD-10-CM | POA: Insufficient documentation

## 2019-11-18 NOTE — Telephone Encounter (Signed)
The pt was told notified that her ozempic has been delivered from the [facility] patient assistance is available for pickup the pt said she will get it at her next visit.

## 2019-11-22 ENCOUNTER — Ambulatory Visit: Payer: Self-pay

## 2019-11-22 ENCOUNTER — Telehealth: Payer: Medicare HMO

## 2019-11-22 ENCOUNTER — Other Ambulatory Visit: Payer: Self-pay

## 2019-11-22 DIAGNOSIS — E1122 Type 2 diabetes mellitus with diabetic chronic kidney disease: Secondary | ICD-10-CM

## 2019-11-22 DIAGNOSIS — N183 Chronic kidney disease, stage 3 unspecified: Secondary | ICD-10-CM

## 2019-11-22 DIAGNOSIS — I129 Hypertensive chronic kidney disease with stage 1 through stage 4 chronic kidney disease, or unspecified chronic kidney disease: Secondary | ICD-10-CM

## 2019-11-22 DIAGNOSIS — Z6836 Body mass index (BMI) 36.0-36.9, adult: Secondary | ICD-10-CM

## 2019-11-23 NOTE — Patient Instructions (Signed)
Visit Information  Goals Addressed      Patient Stated   .  "To keep my BP well managed" (pt-stated)        Current Barriers:  [name] Knowledge Deficits related to Hypertension disease process and Self Health Management  . Chronic Disease Management support and education needs related to DMII, CKDIII, HTN, Class 2 Severe Obesity   Nurse Case Manager Clinical Goal(s):  [name] New 11/22/19 Over the next 180 days, patient will work with the CCM team and PCP to address needs related to disease education and support for improved Self Health management of Hypertension     CCM RN CM Interventions:  11/22/19 inbound call completed with patient . Inter-disciplinary care team collaboration (see longitudinal plan of care) . Evaluation of current treatment plan related to HTN and patient's adherence to plan as established by provider. . Determined patient is experiencing an elevated BP following receipt of a Cortisone injection to her right hip prescribed by Orthopedics . Educated patient with rationale on potential SE secondary to receiving Cortisone injections including hypertensive episodes . Determined patient was unable to complete her PT this am due to her BP was elevated to 180/96; Determined patient will continue to Self monitor and notify this RN and or PCP of persistent elevated BP . Determined patient has notified the Orthopedic MD of her elevated BP . Discussed upcoming PCP appointment scheduled with PCP Dr. Baird Cancer on 11/24/19 @10  am  . Sent in basket message to Dr. Baird Cancer to inform of patient's reported elevated BP following her Cortisone injection  . Discussed plans with patient for ongoing care management follow up and provided patient with direct contact information for care management team  Patient Self Care Activities:  . Self administers medications as prescribed . Attends all scheduled provider appointments . Calls pharmacy for medication refills . Performs ADL's independently . Performs  IADL's independently . Calls provider office for new concerns or questions  Please see past updates related to this goal by clicking on the "Past Updates" button in the selected goal        Patient verbalizes understanding of instructions provided today.   Telephone follow up appointment with care management team member scheduled for: 11/25/19  Barb Merino, RN, BSN, CCM Care Management Coordinator [place] [facility]  Direct Phone: [phone]

## 2019-11-23 NOTE — Chronic Care Management (AMB) (Signed)
Chronic Care Management   Follow Up Note   11/22/2019 Name: Teresa Golden MRN: [id number] DOB: [birth year]  Referred by: Glendale Chard, MD Reason for referral : Chronic Care Management (Inbound call from patient)   Teresa Golden is a 74 y.o. year old female who is a primary care patient of Glendale Chard, MD. The CCM team was consulted for assistance with chronic disease management and care coordination needs.    Review of patient status, including review of consultants reports, relevant laboratory and other test results, and collaboration with appropriate care team members and the patient's provider was performed as part of comprehensive patient evaluation and provision of chronic care management services.    SDOH (Social Determinants of Health) assessments performed: No See Care Plan activities for detailed interventions related to [place])   Received an inbound call from patient concerning her elevated BP following a Cortisone injection.    Outpatient Encounter Medications as of 11/22/2019  Medication Sig  . nystatin (MYCOSTATIN/NYSTOP) powder Apply 1 application topically 2 (two) times daily. apply to affected area twice daily as needed  . amLODipine (NORVASC) 5 MG tablet Take 1 tablet (5 mg total) by mouth daily.  . B Complex-C (B-COMPLEX WITH VITAMIN C) tablet Take 1 tablet by mouth daily.  . Biotin 10000 MCG TABS Take 1 tablet by mouth daily.  . carvedilol (COREG CR) 20 MG 24 hr capsule TAKE 1 CAPSULE DAILY  . Cholecalciferol 5000 units TABS Take 5,000 Units by mouth daily.  . Colchicine 0.6 MG CAPS TAKE 1 CAPSULE DAILY AS    NEEDED  . Cyanocobalamin (VITAMIN B-12) 2500 MCG SUBL Take 2,500 mcg by mouth daily.  . diclofenac Sodium (VOLTAREN) 1 % GEL APPLY 2 GRAMS TO AFFECTED AREA(S) 4 TIMES DAILY AS NEEDED  . docusate sodium (COLACE) 100 MG capsule Take 1 capsule (100 mg total) by mouth 2 (two) times daily. (Patient not taking: Reported on 09/30/2019)  . famotidine (PEPCID) 20 MG  tablet Take 20 mg by mouth daily. PRN  . gabapentin (NEURONTIN) 300 MG capsule Take 300 mg by mouth at bedtime.   . insulin degludec (TRESIBA FLEXTOUCH) 100 UNIT/ML SOPN FlexTouch Pen Inject 0.09 mLs (9 Units total) into the skin daily at 10 pm. (Patient taking differently: Inject 8 Units into the skin daily at 10 pm. )  . losartan (COZAAR) 100 MG tablet Take 1 tablet (100 mg total) by mouth daily.  [name] losartan (COZAAR) 50 MG tablet 2 tablets 2 times per day  . Multiple Vitamins-Minerals (MULTIVITAMIN WITH MINERALS) tablet Take 1 tablet by mouth daily. Women's 50+  . naproxen sodium (ANAPROX) 220 MG tablet Take 440 mg by mouth 2 (two) times daily as needed (pain). 2 tablets every night at bedtime  . OZEMPIC, 1 MG/DOSE, 2 MG/1.5ML SOPN INJECT INTO SKIN ONCE A WEEK (Patient taking differently: 1mg )  . simvastatin (ZOCOR) 10 MG tablet TAKE 1 TABLET DAILY  . SYNTHROID 25 MCG tablet Take 0.5 tablet (12.5 mcg) by mouth on Mondays through Fridays, then take 1 tablet (25 mcg) by mouth on Saturdays and Sundays.   No facility-administered encounter medications on file as of 11/22/2019.     Objective:  Lab Results  Component Value Date   HGBA1C 6.5 (H) 08/18/2019   HGBA1C 6.3 (H) 04/19/2019   HGBA1C 6.4 (H) 01/17/2019   Lab Results  Component Value Date   MICROALBUR 10 01/17/2019   LDLCALC 82 01/17/2019   CREATININE 0.83 08/18/2019   BP Readings from Last 3 Encounters:  09/30/19 (!) 150/82  09/06/19 132/70  08/18/19 (!) 144/82    Goals Addressed      Patient Stated   .  "To keep my BP well managed" (pt-stated)        Current Barriers:  [name] Knowledge Deficits related to Hypertension disease process and Self Health Management  . Chronic Disease Management support and education needs related to DMII, CKDIII, HTN, Class 2 Severe Obesity   Nurse Case Manager Clinical Goal(s):  [name] New 11/22/19 Over the next 180 days, patient will work with the CCM team and PCP to address needs related to disease  education and support for improved Self Health management of Hypertension     CCM RN CM Interventions:  11/22/19 inbound call completed with patient . Inter-disciplinary care team collaboration (see longitudinal plan of care) . Evaluation of current treatment plan related to HTN and patient's adherence to plan as established by provider. . Determined patient is experiencing an elevated BP following receipt of a Cortisone injection to her right hip prescribed by Orthopedics . Educated patient with rationale on potential SE secondary to receiving Cortisone injections including hypertensive episodes . Determined patient was unable to complete her PT this am due to her BP was elevated to 180/96; Determined patient will continue to Self monitor and notify this RN and or PCP of persistent elevated BP . Determined patient has notified the Orthopedic MD of her elevated BP . Discussed upcoming PCP appointment scheduled with PCP Dr. Baird Cancer on 11/24/19 @10  am  . Sent in basket message to Dr. Baird Cancer to inform of patient's reported elevated BP following her Cortisone injection  . Discussed plans with patient for ongoing care management follow up and provided patient with direct contact information for care management team  Patient Self Care Activities:  . Self administers medications as prescribed . Attends all scheduled provider appointments . Calls pharmacy for medication refills . Performs ADL's independently . Performs IADL's independently . Calls provider office for new concerns or questions  Please see past updates related to this goal by clicking on the "Past Updates" button in the selected goal        Plan:   Telephone follow up appointment with care management team member scheduled for: 11/25/19  Barb Merino, RN, BSN, CCM Care Management Coordinator [place] [facility]  Direct Phone: [phone]

## 2019-11-24 ENCOUNTER — Other Ambulatory Visit: Payer: Self-pay

## 2019-11-24 ENCOUNTER — Ambulatory Visit: Payer: Medicare HMO | Admitting: Nurse Practitioner

## 2019-11-25 ENCOUNTER — Telehealth: Payer: Self-pay

## 2019-11-28 ENCOUNTER — Ambulatory Visit: Payer: Medicare HMO | Admitting: Psychology

## 2019-11-29 ENCOUNTER — Ambulatory Visit (INDEPENDENT_AMBULATORY_CARE_PROVIDER_SITE_OTHER): Payer: Medicare HMO

## 2019-11-29 ENCOUNTER — Ambulatory Visit (INDEPENDENT_AMBULATORY_CARE_PROVIDER_SITE_OTHER): Payer: Medicare HMO | Admitting: Internal Medicine

## 2019-11-29 ENCOUNTER — Other Ambulatory Visit: Payer: Self-pay

## 2019-11-29 ENCOUNTER — Telehealth: Payer: Self-pay

## 2019-11-29 ENCOUNTER — Encounter: Payer: Self-pay | Admitting: Internal Medicine

## 2019-11-29 VITALS — BP 130/68 | HR 67 | Temp 98.0°F | Ht 65.0 in | Wt 208.0 lb

## 2019-11-29 VITALS — BP 130/68 | HR 68 | Temp 98.0°F | Ht 65.0 in | Wt 209.0 lb

## 2019-11-29 DIAGNOSIS — R102 Pelvic and perineal pain: Secondary | ICD-10-CM | POA: Diagnosis not present

## 2019-11-29 DIAGNOSIS — Z6834 Body mass index (BMI) 34.0-34.9, adult: Secondary | ICD-10-CM

## 2019-11-29 DIAGNOSIS — Z23 Encounter for immunization: Secondary | ICD-10-CM

## 2019-11-29 DIAGNOSIS — E6609 Other obesity due to excess calories: Secondary | ICD-10-CM | POA: Diagnosis not present

## 2019-11-29 DIAGNOSIS — Z794 Long term (current) use of insulin: Secondary | ICD-10-CM | POA: Insufficient documentation

## 2019-11-29 DIAGNOSIS — E1122 Type 2 diabetes mellitus with diabetic chronic kidney disease: Secondary | ICD-10-CM | POA: Diagnosis not present

## 2019-11-29 DIAGNOSIS — N1831 Chronic kidney disease, stage 3a: Secondary | ICD-10-CM

## 2019-11-29 DIAGNOSIS — N183 Chronic kidney disease, stage 3 unspecified: Secondary | ICD-10-CM | POA: Insufficient documentation

## 2019-11-29 DIAGNOSIS — I129 Hypertensive chronic kidney disease with stage 1 through stage 4 chronic kidney disease, or unspecified chronic kidney disease: Secondary | ICD-10-CM | POA: Diagnosis not present

## 2019-11-29 NOTE — Progress Notes (Signed)
Pt here today for flu shot  

## 2019-11-29 NOTE — Progress Notes (Signed)
I,Katawbba Wiggins,acting as a Education administrator for Maximino Greenland, MD.,have documented all relevant documentation on the behalf of Maximino Greenland, MD,as directed by  Maximino Greenland, MD while in the presence of Maximino Greenland, MD.  This visit occurred during the SARS-CoV-2 public health emergency.  Safety protocols were in place, including screening questions prior to the visit, additional usage of staff PPE, and extensive cleaning of exam room while observing appropriate contact time as indicated for disinfecting solutions.  Subjective:     [name] , female    DOB: 03/05/1945 , 74 y.o.   MRN: [id number]   Chief Complaint  Patient presents with  . GYN referral    HPI  The patient is here today for a referral to gyn. She wants to see a GYN because "it has been a long time". She reports having pelvic pain for the past month. Unable to state what triggers her pain. Wants to get her ovaries checked. Reports having regular bowel movements. Sometimes, a BM may help to alleviate the discomfort. No associated n/v/d. Unable to state what triggers the pain. Described as dull,throbbing pain. She has had partial hysterectomy.   Diabetes She presents for her follow-up diabetic visit. She has type 2 diabetes mellitus. Pertinent negatives for diabetes include no blurred vision and no chest pain. There are no hypoglycemic complications. Risk factors for coronary artery disease include diabetes mellitus, dyslipidemia, hypertension, obesity, sedentary lifestyle and post-menopausal. She is following a diabetic diet. She participates in exercise intermittently. Her home blood glucose trend is fluctuating minimally. Her breakfast blood glucose is taken between 8-9 am. Her breakfast blood glucose range is generally 110-130 mg/dl. An ACE inhibitor/angiotensin II receptor blocker is being taken. Eye exam is current.  Hypertension This is a chronic problem. The current episode started more than 1 year ago.  The problem has been gradually improving since onset. The problem is uncontrolled. Pertinent negatives include no blurred vision or chest pain. Risk factors for coronary artery disease include diabetes mellitus, dyslipidemia, post-menopausal state and sedentary lifestyle. Past treatments include angiotensin blockers and beta blockers. The current treatment provides moderate improvement.     Past Medical History:  Diagnosis Date  . Arthritis   . Chronic kidney disease    self reports ckd stage 3   . Diabetes mellitus, type II, insulin dependent ([place])    With neurologic complications. Bilateral lower extremity peripheral neuropathy  . Dyspnea    with excertion; no issues now since weight loss surgery   . Endometriosis   . Essential hypertension   . GERD (gastroesophageal reflux disease)   . 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 Hypertensive nephropathy 07/05/2018  . Hypothyroidism   . Hypothyroidism 02/06/2018  . Morbid obesity with BMI of 50.0-59.9, adult ([place])   . Scoliosis      Family History  Problem Relation Age of Onset  . Heart attack Mother   . Heart failure Mother   . Stroke Father      Current Outpatient Medications:  .  nystatin (MYCOSTATIN/NYSTOP) powder, Apply 1 application topically 2 (two) times daily. apply to affected area twice daily as needed, Disp: 60 g, Rfl: 1 .  amLODipine (NORVASC) 5 MG tablet, Take 1 tablet (5 mg total) by mouth daily., Disp: 90 tablet, Rfl: 1 .  B Complex-C (B-COMPLEX WITH VITAMIN C) tablet, Take 1 tablet by mouth daily., Disp: , Rfl:  .  Biotin  10000 MCG TABS, Take 1 tablet by mouth daily., Disp: , Rfl:  .  carvedilol (COREG CR) 20 MG 24 hr capsule, TAKE 1 CAPSULE DAILY, Disp: 90 capsule, Rfl: 2 .  Cholecalciferol 5000 units TABS, Take 5,000 Units by mouth daily., Disp: , Rfl:  .  Colchicine 0.6 MG CAPS, TAKE 1 CAPSULE DAILY AS    NEEDED, Disp: 90 capsule, Rfl: 1 .  Cyanocobalamin (VITAMIN B-12)  2500 MCG SUBL, Take 2,500 mcg by mouth daily., Disp: , Rfl:  .  diclofenac Sodium (VOLTAREN) 1 % GEL, APPLY 2 GRAMS TO AFFECTED AREA(S) 4 TIMES DAILY AS NEEDED, Disp: 200 g, Rfl: 2 .  docusate sodium (COLACE) 100 MG capsule, Take 1 capsule (100 mg total) by mouth 2 (two) times daily. (Patient not taking: Reported on 09/30/2019), Disp: 60 capsule, Rfl: 1 .  famotidine (PEPCID) 20 MG tablet, Take 20 mg by mouth daily. PRN, Disp: , Rfl:  .  gabapentin (NEURONTIN) 300 MG capsule, Take 300 mg by mouth at bedtime. , Disp: , Rfl:  .  insulin degludec (TRESIBA FLEXTOUCH) 100 UNIT/ML SOPN FlexTouch Pen, Inject 0.09 mLs (9 Units total) into the skin daily at 10 pm. (Patient taking differently: Inject 8 Units into the skin daily at 10 pm. ), Disp: 5 pen, Rfl: 2 .  losartan (COZAAR) 100 MG tablet, Take 1 tablet (100 mg total) by mouth daily., Disp: 90 tablet, Rfl: 2 .  losartan (COZAAR) 50 MG tablet, 2 tablets 2 times per day, Disp: 180 tablet, Rfl: 1 .  Multiple Vitamins-Minerals (MULTIVITAMIN WITH MINERALS) tablet, Take 1 tablet by mouth daily. Women's 50+, Disp: , Rfl:  .  naproxen sodium (ANAPROX) 220 MG tablet, Take 440 mg by mouth 2 (two) times daily as needed (pain). 2 tablets every night at bedtime, Disp: , Rfl:  .  OZEMPIC, 1 MG/DOSE, 2 MG/1.5ML SOPN, INJECT INTO SKIN ONCE A WEEK (Patient taking differently: 79m), Disp: 9 pen, Rfl: 1 .  simvastatin (ZOCOR) 10 MG tablet, TAKE 1 TABLET DAILY, Disp: 90 tablet, Rfl: 1 .  SYNTHROID 25 MCG tablet, Take 0.5 tablet (12.5 mcg) by mouth on Mondays through Fridays, then take 1 tablet (25 mcg) by mouth on Saturdays and Sundays., Disp: 90 tablet, Rfl: 1   Allergies  Allergen Reactions  . Meloxicam Other (See Comments)    Fever; muscle aches; "flu-like" symptoms  . Other     Rose fever and hay fever      Review of Systems  Constitutional: Negative.   Eyes: Negative for blurred vision.  Respiratory: Negative.   Cardiovascular: Negative.  Negative for chest  pain.  Gastrointestinal: Negative.   Genitourinary: Positive for pelvic pain.  Psychiatric/Behavioral: Negative.   All other systems reviewed and are negative.    Today's Vitals   11/29/19 1223  BP: 130/68  Pulse: 67  Temp: 98 F (36.7 C)  TempSrc: Oral  Weight: 208 lb (94.3 kg)  Height: '5\' 5"'  (1.651 m)   Body mass index is 34.61 kg/m.  Wt Readings from Last 3 Encounters:  11/29/19 208 lb (94.3 kg)  11/29/19 209 lb (94.8 kg)  09/30/19 215 lb 3.2 oz (97.6 kg)   Objective:  Physical Exam Vitals and nursing note reviewed.  Constitutional:      Appearance: Normal appearance. She is obese.  HENT:     Head: Normocephalic and atraumatic.  Cardiovascular:     Rate and Rhythm: Normal rate and regular rhythm.     Heart sounds: Normal heart sounds.  Pulmonary:  Breath sounds: Normal breath sounds.  Abdominal:     General: Bowel sounds are normal.     Palpations: Abdomen is soft.     Tenderness: There is abdominal tenderness in the right lower quadrant.     Comments: RLQ tenderness to palpation. There is some right groin tenderness as well.   Skin:    General: Skin is warm.  Neurological:     General: No focal deficit present.     Mental Status: She is alert and oriented to person, place, and time.         Assessment And Plan:     1. Pelvic pain Comments: I will schedule her for pelvic/TV ultrasound. She is in agreement with her treatment plan.  - US Pelvic Complete With Transvaginal; Future  2. Type 2 diabetes mellitus with stage 3a chronic kidney disease, without long-term current use of insulin (HCC) Comments: Chronic , I will check an a1c today. Most recent GFR has improved. I will recheck today. She will rto in Dec 2021 for her next physical examination.  - Hemoglobin A1c  3. Hypertensive nephropathy Comments: Chronic, fair control. She will continue with current meds. She is encouraged to avoid adding salt to her foods.  - CMP14+EGFR  4. Class 1 obesity  due to excess calories with serious comorbidity and body mass index (BMI) of 34.0 to 34.9 in adult Comments: She has lost another 7 pounds since her last visit. She was congratulated on her progress and encouraged to keep up the great work!      Patient was given opportunity to ask questions. Patient verbalized understanding of the plan and was able to repeat key elements of the plan. All questions were answered to their satisfaction.  Maximino Greenland, MD   I, Maximino Greenland, MD, have reviewed all documentation for this visit. The documentation on 11/29/19 for the exam, diagnosis, procedures, and orders are all accurate and complete.  THE PATIENT IS ENCOURAGED TO PRACTICE SOCIAL DISTANCING DUE TO THE COVID-19 PANDEMIC.

## 2019-11-29 NOTE — Telephone Encounter (Cosign Needed)
  Chronic Care Management   Outreach Note  11/29/2019 Name: Teresa Golden MRN: [id number] DOB: 12-10-1945  Referred by: Glendale Chard, MD Reason for referral : Chronic Care Management (Inbound call from patient )   Inbound call from patient. Voice message received from patient stating she needs help locating a new therapist. The patient was referred to the case management team for assistance with care management and care coordination.   Follow Up Plan: Telephone follow up appointment with care management team member scheduled for:12/02/19  Barb Merino, RN, BSN, CCM Care Management Coordinator [place] [facility]  Direct Phone: [phone]

## 2019-11-29 NOTE — Patient Instructions (Signed)
Pelvic Pain, Female Pelvic pain is pain in your lower belly (abdomen), below your belly button and between your hips. The pain may start suddenly (be acute), keep coming back (be recurring), or last a long time (become chronic). Pelvic pain that lasts longer than 6 months is called chronic pelvic pain. There are many causes of pelvic pain. Sometimes the cause of pelvic pain is not known. Follow these instructions at home:   Take over-the-counter and prescription medicines only as told by your doctor.  Rest as told by your doctor.  Do not have sex if it hurts.  Keep a journal of your pelvic pain. Write down: ? When the pain started. ? Where the pain is located. ? What seems to make the pain better or worse, such as food or your period (menstrual cycle). ? Any symptoms you have along with the pain.  Keep all follow-up visits as told by your doctor. This is important. Contact a doctor if:  Medicine does not help your pain.  Your pain comes back.  You have new symptoms.  You have unusual discharge or bleeding from your vagina.  You have a fever or chills.  You are having trouble pooping (constipation).  You have blood in your pee (urine) or poop (stool).  Your pee smells bad.  You feel weak or light-headed. Get help right away if:  You have sudden pain that is very bad.  Your pain keeps getting worse.  You have very bad pain and also have any of these symptoms: ? A fever. ? Feeling sick to your stomach (nausea). ? Throwing up (vomiting). ? Being very sweaty.  You pass out (lose consciousness). Summary  Pelvic pain is pain in your lower belly (abdomen), below your belly button and between your hips.  There are many possible causes of pelvic pain.  Keep a journal of your pelvic pain. This information is not intended to replace advice given to you by your health care provider. Make sure you discuss any questions you have with your health care provider. Document  Revised: 07/29/2017 Document Reviewed: 07/29/2017 Elsevier Patient Education  2020 Elsevier Inc.  

## 2019-11-30 LAB — CMP14+EGFR
ALT: 30 IU/L (ref 0–32)
AST: 28 IU/L (ref 0–40)
Albumin/Globulin Ratio: 1.3 (ref 1.2–2.2)
Albumin: 4 g/dL (ref 3.7–4.7)
Alkaline Phosphatase: 84 IU/L (ref 44–121)
BUN/Creatinine Ratio: 19 (ref 12–28)
BUN: 18 mg/dL (ref 8–27)
Bilirubin Total: 0.4 mg/dL (ref 0.0–1.2)
CO2: 23 mmol/L (ref 20–29)
Calcium: 9.6 mg/dL (ref 8.7–10.3)
Chloride: 105 mmol/L (ref 96–106)
Creatinine, Ser: 0.96 mg/dL (ref 0.57–1.00)
GFR calc Af Amer: 67 mL/min/{1.73_m2} (ref >59)
GFR calc non Af Amer: 58 mL/min/{1.73_m2} — ABNORMAL LOW (ref >59)
Globulin, Total: 3 g/dL (ref 1.5–4.5)
Glucose: 128 mg/dL — ABNORMAL HIGH (ref 65–99)
Potassium: 4.2 mmol/L (ref 3.5–5.2)
Sodium: 145 mmol/L — ABNORMAL HIGH (ref 134–144)
Total Protein: 7 g/dL (ref 6.0–8.5)

## 2019-11-30 LAB — HEMOGLOBIN A1C
Est. average glucose Bld gHb Est-mCnc: 146 mg/dL
Hgb A1c MFr Bld: 6.7 % — ABNORMAL HIGH (ref 4.8–5.6)

## 2019-12-01 ENCOUNTER — Other Ambulatory Visit: Payer: Self-pay

## 2019-12-01 ENCOUNTER — Ambulatory Visit: Payer: Self-pay

## 2019-12-01 DIAGNOSIS — I129 Hypertensive chronic kidney disease with stage 1 through stage 4 chronic kidney disease, or unspecified chronic kidney disease: Secondary | ICD-10-CM

## 2019-12-01 DIAGNOSIS — N1831 Chronic kidney disease, stage 3a: Secondary | ICD-10-CM

## 2019-12-01 DIAGNOSIS — E1122 Type 2 diabetes mellitus with diabetic chronic kidney disease: Secondary | ICD-10-CM

## 2019-12-01 NOTE — Chronic Care Management (AMB) (Signed)
Chronic Care Management Pharmacy  Name: Teresa Golden  MRN: [id number] DOB: March 07, 1945  Chief Complaint/ HPI  Teresa Golden,  74 y.o. , female presents for their Follow-Up CCM visit with the clinical pharmacist via telephone due to COVID-19 Pandemic.  PCP : Teresa Chard, MD  Their chronic conditions include: Type 2 Diabetes mellitus, Hypertension, Hypothyroidism, CKD, Osteoarthritis  Office Visits: 11/29/19 OV: Presents for gynecology referral. Pt reports having pelvic pain for the past month. Schedule for pelvic/TV ultrasound. HTN fair control. HgbA1c up to 6.7%. GFR has decreased. Influenza vaccine administered.   11/18/19 Telephone call: Pt notified Ozempic delivered from patient assistance program  09/06/19 Nurse visit: BP 132/72 manually, 148/80 on home BP cuff pt brought today to office. Follow up with PCP in September.   08/18/19 AWV and OV: HTN uncontrolled. Advised pt to decrease losartan back to 50 mg twice daily. Start amlodipine 58m once daily with evening meal. Return in 2 weeks for nurse visit. Liver function, kidney function, and HgbA1c stable. STD tests negative except for exposure to herpes type 2.   07/27/19 Telephone call: TTyler Aasdecreased to 8 units nightly  04/19/19 OV: Presented for DM and HTN follow up visit. Pt reported more anxiousness lately due to current living situation. She lives in an assisted living facility and stated that it has too many rules and she cannot have visitors, she is looking for a new apartment. DM chronic, fair control. Plan to wean off of insulin eventually. Labs ordered (HgbA1c, BMP8+EGFR). Pt denied prescription treatment for anxiety but would like to start therapy. She may also benefit from Mg supplementation. Referred to Hepatology for further evaluation of cirrhosis of the liver and hepatoma.  02/08/19 AWV  Consult Visit: 09/30/19 Plastic surgery OV w/ Dr. DMarla Golden Consult. Presents for evaluation for abdomen. Will need release from  physical therapy and updated A1c prior to surgery. Pt may need more physical therapy. Recommend panniculectomy.   CCM Encounters: 08/16/19 RN: Pt called about elevated BPs for several days. Pt called EMS Sunday due to severe headache and BP 190/100. Pt decided to double up on losartan 566m(started taking 2 tabs twice daily instead of 1 tablet twice daily). Advised pt not safe to double up on losartan. OV scheduled for 6/24 with PCP.   07/26/19 SW and RN: Discussed pt current diabetes treatment regimen (Tresiba 14 units daily). Pt reported low BG readings (low 80s) and would like to decrease [place] and [place] possible. Average BG readings 108-117. Referred to pharmacy for diabetes management. Collaborated with PCP regarding FBG readings and Tresiba dosing, will decrease to 8 units daily.  06/22/19 RN: Evaluated diabetes treatment plan and pt goals. Provided pt education regarding diet, exercise, and medication adherence. Advised pt to check BG 1-2 times daily before meals and record.   03/29/19: Ozempic approved via [employer] 02/24/20.  03/28/19 SW: Addressed care coordination needs regard emergency alert system and arranging ental care  03/21/19 RN: Discussed timing of COVID and Shingrix vaccines  03/18/19 SW: Care coordination and collaboration regarding dental appointment. Completed Ozempic PAP finished and faxed to [employer]  1/11/2 PharmD: Pt reported FBGs have not exceeded 130. She increased Tresiba to 14 units daily due to higher blood sugars (she had been using 7-10 units nightly). Attempting to get FreeStyle Libre covered on insurance, but unlikely due to current guidelines. Pt reported taking statin M/W/F. Stated she is trying to take daily now. Encourage pt to continue aspirin 8112maily.   03/04/19 RN:  Discussed proper timing of Shingrix vaccine and COVID vaccines. Instructed to get second shingles vaccine as planned (2/15) and then wait 2 weeks until receiving COVID vaccines  02/28/19  PharmD: Comprehensive medication review performed.   Medications: Outpatient Encounter Medications as of 12/01/2019  Medication Sig  . nystatin (MYCOSTATIN/NYSTOP) powder Apply 1 application topically 2 (two) times daily. apply to affected area twice daily as needed  . amLODipine (NORVASC) 5 MG tablet Take 1 tablet (5 mg total) by mouth daily.  . B Complex-C (B-COMPLEX WITH VITAMIN C) tablet Take 1 tablet by mouth daily.  . Biotin 10000 MCG TABS Take 1 tablet by mouth daily.  . carvedilol (COREG CR) 20 MG 24 hr capsule TAKE 1 CAPSULE DAILY  . Cholecalciferol 5000 units TABS Take 5,000 Units by mouth daily.  . Colchicine 0.6 MG CAPS TAKE 1 CAPSULE DAILY AS    NEEDED  . Cyanocobalamin (VITAMIN B-12) 2500 MCG SUBL Take 2,500 mcg by mouth daily.  . diclofenac Sodium (VOLTAREN) 1 % GEL APPLY 2 GRAMS TO AFFECTED AREA(S) 4 TIMES DAILY AS NEEDED  . docusate sodium (COLACE) 100 MG capsule Take 1 capsule (100 mg total) by mouth 2 (two) times daily. (Patient not taking: Reported on 09/30/2019)  . famotidine (PEPCID) 20 MG tablet Take 20 mg by mouth daily. PRN  . gabapentin (NEURONTIN) 300 MG capsule Take 300 mg by mouth at bedtime.   . insulin degludec (TRESIBA FLEXTOUCH) 100 UNIT/ML SOPN FlexTouch Pen Inject 0.09 mLs (9 Units total) into the skin daily at 10 pm. (Patient taking differently: Inject 8 Units into the skin daily at 10 pm. )  . losartan (COZAAR) 100 MG tablet Take 1 tablet (100 mg total) by mouth daily.  [name] losartan (COZAAR) 50 MG tablet 2 tablets 2 times per day  . Multiple Vitamins-Minerals (MULTIVITAMIN WITH MINERALS) tablet Take 1 tablet by mouth daily. Women's 50+  . naproxen sodium (ANAPROX) 220 MG tablet Take 440 mg by mouth 2 (two) times daily as needed (pain). 2 tablets every night at bedtime  . OZEMPIC, 1 MG/DOSE, 2 MG/1.5ML SOPN INJECT INTO SKIN ONCE A WEEK (Patient taking differently: 18m)  . simvastatin (ZOCOR) 10 MG tablet TAKE 1 TABLET DAILY  . SYNTHROID 25 MCG tablet Take 0.5  tablet (12.5 mcg) by mouth on Mondays through Fridays, then take 1 tablet (25 mcg) by mouth on Saturdays and Sundays.   No facility-administered encounter medications on file as of 12/01/2019.    Current Diagnosis/Assessment:  SDOH Interventions     Most Recent Value  SDOH Interventions  Financial Strain Interventions Other (Comment)  [Continue to assist with patient assistance as needed]      Goals Addressed            This Visit's Progress   . Pharmacy Care Plan       CARE PLAN ENTRY (see longitudinal plan of care for additional care plan information)  Current Barriers:  . Chronic Disease Management support, education, and care coordination needs related to Hypertension, Diabetes, and Hypothyroidism   Hypertension BP Readings from Last 3 Encounters:  09/30/19 (!) 150/82  09/06/19 132/70  08/18/19 (!) 144/82   . Pharmacist Clinical Goal(s): o Over the next 90 days, patient will work with PharmD and providers to achieve BP goal <130/80 . Current regimen:   Carvedilol CR 2100mdaily  Losartan 5037mwice daily  Amlodipine 5mg60mily . Interventions: o Recommend patient increase exercise to 30 minutes daily 5 times per week o Recommend patient limit salt  intake o Advised patient to start checking blood pressure daily o Patient mentions she is almost out of amlodipine - Advised patient she has 1 refill left of amlodipine available at [facility] . Patient self care activities - Over the next 90 days, patient will: o Check BP daily, document, and provide at future appointments o Ensure daily salt intake < 2300 mg/day o Exercise 30 minutes per day, 5 days per week o Call in refill for amlodipine to [facility] pharmacy  Hyperlipidemia Lab Results  Component Value Date/Time   LDLCALC 82 01/17/2019 11:56 AM   . Pharmacist Clinical Goal(s): o Over the next 90 days, patient will work with PharmD and providers to achieve LDL goal < 70 . Current regimen:   o Simvastatin 96m daily (Monday through Friday) . Interventions: o Recommend lipid panel at next PCP appointment o Discussed appropriate goal for LDL (less than 70) . Patient self care activities - Over the next 90 days, patient will: o Take cholesterol medication as directed o Exercise 30 minutes daily, 5 times per week o Limit fried and fatty foods  Diabetes Lab Results  Component Value Date/Time   HGBA1C 6.5 (H) 08/18/2019 02:10 PM   HGBA1C 6.3 (H) 04/19/2019 12:12 PM   . Pharmacist Clinical Goal(s): o Over the next 90 days, patient will work with PharmD and providers to maintain A1c goal <7% . Current regimen:   Tresiba 100 units/mL 8 units daily   Ozempic 196mweekly . Interventions: o Discussed appropriate blood sugar goals (less than 130 if fasting, less than 180 if 2 hours after eating) o Determined patient has received enough Ozempic to last until the end of the year o Discussed eligibility criteria for 2022 Ozempic patient assistance program and when patient can apply - Will determine criteria and notify patient when she can reapply o Provided dietary and exercise recommendations o Recommend patient increase water intake to 64 ounces daily . Patient self care activities - Over the next 90 days, patient will: o Check blood sugar twice daily, document, and provide at future appointments o Contact provider with any episodes of hypoglycemia o Exercise 30 minutes daily 5 times per week o Cut back on carbohydrates and sweets o Increase water intake (goal of 64 ounces daily)  Medication management . Pharmacist Clinical Goal(s): o Over the next 90 days, patient will work with PharmD and providers to maintain optimal medication adherence . Current pharmacy: [facility] . Interventions o Comprehensive medication review performed. o Performed medication cost review comparing [facility] and [facility], but unable to find patient's exact plan on  medicare.gov o Continue current medication management strategy . Patient self care activities - Over the next 90 days, patient will: o Focus on medication adherence by utilization of pill box o Take medications as prescribed o Report any questions or concerns to PharmD and/or provider(s)  Please see past updates related to this goal by clicking on the "Past Updates" button in the selected goal         Diabetes   Recent Relevant Labs: Lab Results  Component Value Date/Time   HGBA1C 6.7 (H) 11/29/2019 02:11 PM   HGBA1C 6.5 (H) 08/18/2019 02:10 PM   MICROALBUR 10 01/17/2019 06:09 PM   MICROALBUR 30 02/04/2018 04:57 PM    Kidney Function Lab Results  Component Value Date/Time   CREATININE 0.96 11/29/2019 02:11 PM   CREATININE 0.83 08/18/2019 02:10 PM   GFRNONAA 58 (L) 11/29/2019 02:11 PM   GFRAA 67 11/29/2019  02:11 PM   Checking BG: 2x per Day   Recent FBG Readings:  Recent pre-meal BG readings:  Recent 2hr PP BG readings:   Recent HS BG readings (2-3 hrs after eating):  Patient has failed these meds in past: Novolog, Novolog 70/30, Januvia Patient is currently controlled on the following medications:   Tresiba 100 units/mL 8 units daily   Ozempic 79m weekly  Last diabetic Foot exam: 01/17/19 Last diabetic Eye exam: Lab Results  Component Value Date/Time   HMDIABEYEEXA No Retinopathy 05/06/2018 12:00 AM    We discussed:  Pt has received enough Ozempic to last the rest of the year  Pt asked about Ozempic patient assistance eligibility criteria for 2022  Pt states she has lost another 5 lbs  Pt reports BG has been a little higher recently . Diet extensively o Pt has been eating a few more cookies and carbohydrates lately o She states that she understands what has been increasing her blood sugar and she is going to try to cut back o Discussed importance of portion control o Recommend pt drink 64 ounces of water daily - Advised pt to stay well hydrated due to  decrease in kidney function recently . Discussed GFR/kidney function - Pt states she has been drinking less water lately . Exercise extensively o Pt recently went through physical therapy for bursitis in her hip (twice weekly) o Pt has been using elastic bands at home (3 times weekly) o Going back to the pool on Monday o Recommend pt get 30 minutes of moderate intensity exercise daily 5 times a week (150 minutes total per week)  Plan Continue current medications  [place] and when [place] can re[place]   Hypertension   Office blood pressures are  BP Readings from Last 3 Encounters:  11/29/19 130/68  11/29/19 130/68  09/30/19 (!) 150/82   Patient has failed these meds in the past: Atenolol, valsartan Patient is currently controlled on the following medications:   Carvedilol CR 261mdaily  Losartan 5077mwice daily  Amlodipine 5mg40mily  Patient checks BP at home twice daily  Patient home BP readings are ranging: 141/72, 135/66  We discussed:  Pt states she needs a refill of amlodipine and will call it in soon  Advised pt that she has 1 refill left on her prescription at [facility]  Diet and exercise extensively  Limiting salt in foods  Pt states BP has been better recently (was elevated after cortisone injection)  Pt also mentions stress from moving as a reason for increased BP at previous visit  Goal BP <130/80  Plan Continue current medications   Hyperlipidemia   Lipid Panel     Component Value Date/Time   CHOL 156 01/17/2019 1156   TRIG 61 01/17/2019 1156   HDL 62 01/17/2019 1156   LDL[place]11/23/2020 1156    The 10-year ASCVD risk score (Goff DC Jr., et al., 2013) is: 28.7%   Values used to calculate the score:     Age: 65 y22rs     Sex: Female     Is Non-Hispanic African American: Yes     Diabetic: Yes     Tobacco smoker: No     Systolic Blood Pressure: 130 212g      Is BP treated: Yes     HDL Cholesterol: 62 mg/dL     Total Cholesterol: 156 mg/dL   Patient has failed these meds in past: N/A  Patient is currently uncontrolled on the following medications:   Simvastatin 73m daily (Monday-Friday)  We discussed:   Overall cholesterol is great, but LDL above goal of 70 Diet and exercise extensively  Plan Continue current medications  Recommend recheck lipid panel at next PCP visit. If LDL above 70, recommend increase to simvastatin 250mdaily  Hypothyroidism   Lab Results  Component Value Date/Time   TSH 1.230 07/05/2018 11:17 AM   Patient has failed these meds in past: N/A Patient is currently controlled on the following medications:   Synthroid 2520m1/2 tablet daily Monday through Friday, 1 tablet on Saturday and Sunday   Plan Continue current medications  Gout   Patient has failed these meds in past: N/A Patient is currently controlled on the following medications:   Colchicine 0.6mg15mpsule daily as needed  Plan Continue current medications  Osteoarthritis   Patient has failed these meds in past: Meloxicam, Tramadol Patient is currently controlled on the following medications:   Diclofenac 1% gel apply 2 grams 4 times daily as needed  Aleve 220mg55mablets every night at bedtime (up to twice daily as needed)  Plan Continue current medications  Neuropathy/ Leg cramps   Patient has failed these meds in past: N/A Patient is currently controlled on the following medications:   Gabapentin 300mg 75medtime  Plan Continue current medications  Start Magnesium cream nightly for leg cramps Review symptoms at follow up to ensure not statin related  Vaccines   Reviewed and discussed patient's vaccination history.    Immunization History  Administered Date(s) Administered  . Fluad Quad(high Dose 65+) 11/10/2018, 11/29/2019  . Influenza, High Dose Seasonal PF 12/21/2017, 11/10/2018  . Influenza,inj,quad, With  Preservative 02/25/2016  . Influenza-Unspecified 10/25/2016  . Moderna SARS-COVID-2 Vaccination 03/28/2019, 04/25/2019  . Pneumococcal Polysaccharide-23 11/02/2013  . Pneumococcal-Unspecified 11/24/2016  . Zoster Recombinat (Shingrix) 11/03/2018, 12/31/2018   We discussed:  Tdap- due every 10 years  Plan Recommend Tdap vaccine in office  Medication Management   Pt uses [facility] for all medications Uses pill box? Yes Pt endorses 100% compliance  We discussed:   Importance of taking medications daily as directed   Plan Continue current medication management strategy    Follow up: 3 month phone visit  CourtnJannette FogomD Clinical Pharmacist [facility] [phone]

## 2019-12-01 NOTE — Patient Instructions (Addendum)
Visit Information  Goals Addressed            This Visit's Progress   . Pharmacy Care Plan       CARE PLAN ENTRY (see longitudinal plan of care for additional care plan information)  Current Barriers:  . Chronic Disease Management support, education, and care coordination needs related to Hypertension, Diabetes, and Hypothyroidism   Hypertension BP Readings from Last 3 Encounters:  09/30/19 (!) 150/82  09/06/19 132/70  08/18/19 (!) 144/82   . Pharmacist Clinical Goal(s): o Over the next 90 days, patient will work with PharmD and providers to achieve BP goal <130/80 . Current regimen:   Carvedilol CR 20mg  daily  Losartan 50mg  twice daily  Amlodipine 5mg  daily . Interventions: o Recommend patient increase exercise to 30 minutes daily 5 times per week o Recommend patient limit salt intake o Advised patient to start checking blood pressure daily o Patient mentions she is almost out of amlodipine - Advised patient she has 1 refill left of amlodipine available at [facility] . Patient self care activities - Over the next 90 days, patient will: o Check BP daily, document, and provide at future appointments o Ensure daily salt intake < 2300 mg/day o Exercise 30 minutes per day, 5 days per week o Call in refill for amlodipine to [facility]  Hyperlipidemia Lab Results  Component Value Date/Time   LDLCALC 82 01/17/2019 11:56 AM   . Pharmacist Clinical Goal(s): o Over the next 90 days, patient will work with PharmD and providers to achieve LDL goal < 70 . Current regimen:  o Simvastatin 10mg  daily (Monday through Friday) . Interventions: o Recommend lipid panel at next PCP appointment o Discussed appropriate goal for LDL (less than 70) . Patient self care activities - Over the next 90 days, patient will: o Take cholesterol medication as directed o Exercise 30 minutes daily, 5 times per week o Limit fried and fatty foods  Diabetes Lab Results  Component  Value Date/Time   HGBA1C 6.5 (H) 08/18/2019 02:10 PM   HGBA1C 6.3 (H) 04/19/2019 12:12 PM   . Pharmacist Clinical Goal(s): o Over the next 90 days, patient will work with PharmD and providers to maintain A1c goal <7% . Current regimen:   Tresiba 100 units/mL 8 units daily   Ozempic 1mg  weekly . Interventions: o Discussed appropriate blood sugar goals (less than 130 if fasting, less than 180 if 2 hours after eating) o Determined patient has received enough Ozempic to last until the end of the year o Discussed eligibility criteria for 2022 Ozempic patient assistance program and when patient can apply - Will determine criteria and notify patient when she can reapply o Provided dietary and exercise recommendations o Recommend patient increase water intake to 64 ounces daily . Patient self care activities - Over the next 90 days, patient will: o Check blood sugar twice daily, document, and provide at future appointments o Contact provider with any episodes of hypoglycemia o Exercise 30 minutes daily 5 times per week o Cut back on carbohydrates and sweets o Increase water intake (goal of 64 ounces daily)  Medication management . Pharmacist Clinical Goal(s): o Over the next 90 days, patient will work with PharmD and providers to maintain optimal medication adherence . Current pharmacy: [facility] . Interventions o Comprehensive medication review performed. o Performed medication cost review comparing [facility] and [facility], but unable to find patient's exact plan on medicare.gov o Continue current medication management strategy .  Patient self care activities - Over the next 90 days, patient will: o Focus on medication adherence by utilization of pill box o Take medications as prescribed o Report any questions or concerns to PharmD and/or provider(s)  Please see past updates related to this goal by clicking on the "Past Updates" button in the selected goal          The patient verbalized understanding of instructions provided today and agreed to receive a mailed copy of patient instruction and/or educational materials.  Telephone follow up appointment with pharmacy team member scheduled for: 03/02/20 @ 4:15 PM  Jannette Fogo, PharmD Clinical Pharmacist [facility] [phone]   Diabetes Mellitus and Nutrition, Adult When you have diabetes (diabetes mellitus), it is very important to have healthy eating habits because your blood sugar (glucose) levels are greatly affected by what you eat and drink. Eating healthy foods in the appropriate amounts, at about the same times every day, can help you:  Control your blood glucose.  Lower your risk of heart disease.  Improve your blood pressure.  Reach or maintain a healthy weight. Every person with diabetes is different, and each person has different needs for a meal plan. Your health care provider may recommend that you work with a diet and nutrition specialist (dietitian) to make a meal plan that is best for you. Your meal plan may vary depending on factors such as:  The calories you need.  The medicines you take.  Your weight.  Your blood glucose, blood pressure, and cholesterol levels.  Your activity level.  Other health conditions you have, such as heart or kidney disease. How do carbohydrates affect me? Carbohydrates, also called carbs, affect your blood glucose level more than any other type of food. Eating carbs naturally raises the amount of glucose in your blood. Carb counting is a method for keeping track of how many carbs you eat. Counting carbs is important to keep your blood glucose at a healthy level, especially if you use insulin or take certain oral diabetes medicines. It is important to know how many carbs you can safely have in each meal. This is different for every person. Your dietitian can help you calculate how many carbs you should have at each  meal and for each snack. Foods that contain carbs include:  Bread, cereal, rice, pasta, and crackers.  Potatoes and corn.  Peas, beans, and lentils.  Milk and yogurt.  Fruit and juice.  Desserts, such as cakes, cookies, ice cream, and candy. How does alcohol affect me? Alcohol can cause a sudden decrease in blood glucose (hypoglycemia), especially if you use insulin or take certain oral diabetes medicines. Hypoglycemia can be a life-threatening condition. Symptoms of hypoglycemia (sleepiness, dizziness, and confusion) are similar to symptoms of having too much alcohol. If your health care provider says that alcohol is safe for you, follow these guidelines:  Limit alcohol intake to no more than 1 drink per day for nonpregnant women and 2 drinks per day for men. One drink equals 12 oz of beer, 5 oz of wine, or 1 oz of hard liquor.  Do not drink on an empty stomach.  Keep yourself hydrated with water, diet soda, or unsweetened iced tea.  Keep in mind that regular soda, juice, and other mixers may contain a lot of sugar and must be counted as carbs. What are tips for following this plan?  Reading food labels  Start by checking the serving size on the "Nutrition Facts" label of  packaged foods and drinks. The amount of calories, carbs, fats, and other nutrients listed on the label is based on one serving of the item. Many items contain more than one serving per package.  Check the total grams (g) of carbs in one serving. You can calculate the number of servings of carbs in one serving by dividing the total carbs by 15. For example, if a food has 30 g of total carbs, it would be equal to 2 servings of carbs.  Check the number of grams (g) of saturated and trans fats in one serving. Choose foods that have low or no amount of these fats.  Check the number of milligrams (mg) of salt (sodium) in one serving. Most people should limit total sodium intake to less than 2,300 mg per  day.  Always check the nutrition information of foods labeled as "low-fat" or "nonfat". These foods may be higher in added sugar or refined carbs and should be avoided.  Talk to your dietitian to identify your daily goals for nutrients listed on the label. Shopping  Avoid buying canned, premade, or processed foods. These foods tend to be high in fat, sodium, and added sugar.  Shop around the outside edge of the grocery store. This includes fresh fruits and vegetables, bulk grains, fresh meats, and fresh dairy. Cooking  Use low-heat cooking methods, such as baking, instead of high-heat cooking methods like deep frying.  Cook using healthy oils, such as olive, canola, or sunflower oil.  Avoid cooking with butter, cream, or high-fat meats. Meal planning  Eat meals and snacks regularly, preferably at the same times every day. Avoid going long periods of time without eating.  Eat foods high in fiber, such as fresh fruits, vegetables, beans, and whole grains. Talk to your dietitian about how many servings of carbs you can eat at each meal.  Eat 4-6 ounces (oz) of lean protein each day, such as lean meat, chicken, fish, eggs, or tofu. One oz of lean protein is equal to: ? 1 oz of meat, chicken, or fish. ? 1 egg. ?  cup of tofu.  Eat some foods each day that contain healthy fats, such as avocado, nuts, seeds, and fish. Lifestyle  Check your blood glucose regularly.  Exercise regularly as told by your health care provider. This may include: ? 150 minutes of moderate-intensity or vigorous-intensity exercise each week. This could be brisk walking, biking, or water aerobics. ? Stretching and doing strength exercises, such as yoga or weightlifting, at least 2 times a week.  Take medicines as told by your health care provider.  Do not use any products that contain nicotine or tobacco, such as cigarettes and e-cigarettes. If you need help quitting, ask your health care provider.  Work with  a Social worker or diabetes educator to identify strategies to manage stress and any emotional and social challenges. Questions to ask a health care provider  Do I need to meet with a diabetes educator?  Do I need to meet with a dietitian?  What number can I call if I have questions?  When are the best times to check my blood glucose? Where to find more information:  American Diabetes Association: diabetes.org  Academy of Nutrition and Dietetics: www.eatright.[employer] of Diabetes and Digestive and Kidney Diseases (NIH): [employer] Summary  A healthy meal plan will help you control your blood glucose and maintain a healthy lifestyle.  Working with a diet and nutrition specialist (dietitian) can help you make a meal  plan that is best for you.  Keep in mind that carbohydrates (carbs) and alcohol have immediate effects on your blood glucose levels. It is important to count carbs and to use alcohol carefully. This information is not intended to replace advice given to you by your health care provider. Make sure you discuss any questions you have with your health care provider. Document Revised: 01/23/2017 Document Reviewed: 03/17/2016 Elsevier Patient Education  2020 [employer].

## 2019-12-02 ENCOUNTER — Ambulatory Visit: Payer: Self-pay

## 2019-12-02 ENCOUNTER — Telehealth: Payer: Self-pay

## 2019-12-02 ENCOUNTER — Telehealth: Payer: Medicare HMO

## 2019-12-02 DIAGNOSIS — E1122 Type 2 diabetes mellitus with diabetic chronic kidney disease: Secondary | ICD-10-CM

## 2019-12-02 DIAGNOSIS — N183 Chronic kidney disease, stage 3 unspecified: Secondary | ICD-10-CM

## 2019-12-02 DIAGNOSIS — N1831 Chronic kidney disease, stage 3a: Secondary | ICD-10-CM

## 2019-12-02 DIAGNOSIS — I129 Hypertensive chronic kidney disease with stage 1 through stage 4 chronic kidney disease, or unspecified chronic kidney disease: Secondary | ICD-10-CM

## 2019-12-02 DIAGNOSIS — F419 Anxiety disorder, unspecified: Secondary | ICD-10-CM

## 2019-12-02 DIAGNOSIS — Z6836 Body mass index (BMI) 36.0-36.9, adult: Secondary | ICD-10-CM

## 2019-12-02 NOTE — Telephone Encounter (Cosign Needed)
  Chronic Care Management   Outreach Note  12/02/2019 Name: Teresa Golden MRN: [id number] DOB: 1945-05-30  Referred by: Glendale Chard, MD Reason for referral : Chronic Care Management (CCM RNCM FU Call )   An unsuccessful telephone outreach was attempted today. The patient was referred to the case management team for assistance with care management and care coordination.   Follow Up Plan: A HIPAA compliant phone message was left for the patient providing contact information and requesting a return call.  Telephone follow up appointment with care management team member scheduled for: 01/04/20  Barb Merino, RN, BSN, CCM Care Management Coordinator [place] [facility]  Direct Phone: [phone]

## 2019-12-05 ENCOUNTER — Ambulatory Visit: Payer: Medicare HMO

## 2019-12-05 DIAGNOSIS — N1831 Chronic kidney disease, stage 3a: Secondary | ICD-10-CM

## 2019-12-05 DIAGNOSIS — E1122 Type 2 diabetes mellitus with diabetic chronic kidney disease: Secondary | ICD-10-CM

## 2019-12-05 DIAGNOSIS — I129 Hypertensive chronic kidney disease with stage 1 through stage 4 chronic kidney disease, or unspecified chronic kidney disease: Secondary | ICD-10-CM

## 2019-12-05 NOTE — Chronic Care Management (AMB) (Signed)
Chronic Care Management    Social Work Follow Up Note  12/05/2019 Name: Teresa Golden MRN: [id number] DOB: 1945-07-28  Teresa Golden is a 74 y.o. year old female who is a primary care patient of Glendale Chard, MD. The CCM team was consulted for assistance with care coordination.   Review of patient status, including review of consultants reports, other relevant assessments, and collaboration with appropriate care team members and the patient's provider was performed as part of comprehensive patient evaluation and provision of chronic care management services.    SDOH (Social Determinants of Health) assessments performed: No    Outpatient Encounter Medications as of 12/05/2019  Medication Sig  . nystatin (MYCOSTATIN/NYSTOP) powder Apply 1 application topically 2 (two) times daily. apply to affected area twice daily as needed  . amLODipine (NORVASC) 5 MG tablet Take 1 tablet (5 mg total) by mouth daily.  . B Complex-C (B-COMPLEX WITH VITAMIN C) tablet Take 1 tablet by mouth daily.  . Biotin 10000 MCG TABS Take 1 tablet by mouth daily.  . carvedilol (COREG CR) 20 MG 24 hr capsule TAKE 1 CAPSULE DAILY  . Cholecalciferol 5000 units TABS Take 5,000 Units by mouth daily.  . Colchicine 0.6 MG CAPS TAKE 1 CAPSULE DAILY AS    NEEDED  . Cyanocobalamin (VITAMIN B-12) 2500 MCG SUBL Take 2,500 mcg by mouth daily.  . diclofenac Sodium (VOLTAREN) 1 % GEL APPLY 2 GRAMS TO AFFECTED AREA(S) 4 TIMES DAILY AS NEEDED  . docusate sodium (COLACE) 100 MG capsule Take 1 capsule (100 mg total) by mouth 2 (two) times daily. (Patient not taking: Reported on 09/30/2019)  . famotidine (PEPCID) 20 MG tablet Take 20 mg by mouth daily. PRN  . gabapentin (NEURONTIN) 300 MG capsule Take 300 mg by mouth at bedtime.   . insulin degludec (TRESIBA FLEXTOUCH) 100 UNIT/ML SOPN FlexTouch Pen Inject 0.09 mLs (9 Units total) into the skin daily at 10 pm. (Patient taking differently: Inject 8 Units into the skin daily at 10 pm. )  .  losartan (COZAAR) 100 MG tablet Take 1 tablet (100 mg total) by mouth daily.  [name] losartan (COZAAR) 50 MG tablet 2 tablets 2 times per day  . Multiple Vitamins-Minerals (MULTIVITAMIN WITH MINERALS) tablet Take 1 tablet by mouth daily. Women's 50+  . naproxen sodium (ANAPROX) 220 MG tablet Take 440 mg by mouth 2 (two) times daily as needed (pain). 2 tablets every night at bedtime  . OZEMPIC, 1 MG/DOSE, 2 MG/1.5ML SOPN INJECT INTO SKIN ONCE A WEEK (Patient taking differently: 1mg )  . simvastatin (ZOCOR) 10 MG tablet TAKE 1 TABLET DAILY  . SYNTHROID 25 MCG tablet Take 0.5 tablet (12.5 mcg) by mouth on Mondays through Fridays, then take 1 tablet (25 mcg) by mouth on Saturdays and Sundays.   No facility-administered encounter medications on file as of 12/05/2019.     Goals Addressed            This Visit's Progress   . Collaborate with RN Care Manager to perform appropriate assessments to assist with care coordination needs       CARE PLAN ENTRY (see longitudinal plan of care for additional care plan information)  Current Barriers:  . Awaiting follow up with therapist to establish care . Chronic conditions including DM II, CKD III, and HTN which put patient at increased risk for hospitalization  Social Work Clinical Goal(s):  [name] Over the next 90 days the patient will work with SW to address ongoing care coordination needs  CCM SW Interventions: Completed 12/05/19 . Inter-disciplinary care team collaboration (see longitudinal plan of care) . Inbound call received from the patient to request follow up on recent referral for therapy to [facility] placed by primary provider during last OV . Discussed the patient has been without a therapist for "a while" and is eager to begin therapeutic relationship . Advised the patient SW would collaborate with her PCP regarding referral . Collaboration with Dr Baird Cancer who reports referral was placed approximately 1 week ago o Dr Baird Cancer reports patient  should give the agency up to two weeks for outreach . Scheduled follow up call to the patient over the next week to determine if contact is received o SW will plan to assist with coordinating care as needed  Patient Self Care Activities:  . Self administers medications as prescribed . Attends all scheduled provider appointments . Calls pharmacy for medication refills . Performs ADL's independently . Performs IADL's independently . Calls provider office for new concerns or questions  Initial goal documentation         Follow Up Plan: SW will follow up with patient by phone over the next week.   Daneen Schick, BSW, CDP Social Worker, Certified Dementia Practitioner [place] [facility] [place] [facility] [phone]  Total time spent performing care coordination and/or care management activities with the patient by phone or face to face = 10 minutes.

## 2019-12-05 NOTE — Patient Instructions (Signed)
Social Worker Visit Information  Goals we discussed today:  Goals Addressed            This Visit's Progress   . Collaborate with RN Care Manager to perform appropriate assessments to assist with care coordination needs       CARE PLAN ENTRY (see longitudinal plan of care for additional care plan information)  Current Barriers:  . Awaiting follow up with therapist to establish care . Chronic conditions including DM II, CKD III, and HTN which put patient at increased risk for hospitalization  Social Work Clinical Goal(s):  [name] Over the next 90 days the patient will work with SW to address ongoing care coordination needs  CCM SW Interventions: Completed 12/05/19 . Inter-disciplinary care team collaboration (see longitudinal plan of care) . Inbound call received from the patient to request follow up on recent referral for therapy to [facility] placed by primary provider during last OV . Discussed the patient has been without a therapist for "a while" and is eager to begin therapeutic relationship . Advised the patient SW would collaborate with her PCP regarding referral . Collaboration with Dr Baird Cancer who reports referral was placed approximately 1 week ago o Dr Baird Cancer reports patient should give the agency up to two weeks for outreach . Scheduled follow up call to the patient over the next week to determine if contact is received o SW will plan to assist with coordinating care as needed  Patient Self Care Activities:  . Self administers medications as prescribed . Attends all scheduled provider appointments . Calls pharmacy for medication refills . Performs ADL's independently . Performs IADL's independently . Calls provider office for new concerns or questions  Initial goal documentation        Follow Up Plan: SW will follow up with patient by phone over the next week.   Daneen Schick, BSW, CDP Social Worker, Certified Dementia Practitioner [place] [facility] [place]  [facility] [phone]

## 2019-12-06 ENCOUNTER — Telehealth: Payer: Self-pay

## 2019-12-06 ENCOUNTER — Ambulatory Visit: Payer: Medicare HMO

## 2019-12-06 DIAGNOSIS — I129 Hypertensive chronic kidney disease with stage 1 through stage 4 chronic kidney disease, or unspecified chronic kidney disease: Secondary | ICD-10-CM

## 2019-12-06 DIAGNOSIS — E1122 Type 2 diabetes mellitus with diabetic chronic kidney disease: Secondary | ICD-10-CM

## 2019-12-06 DIAGNOSIS — N1831 Chronic kidney disease, stage 3a: Secondary | ICD-10-CM

## 2019-12-06 NOTE — Telephone Encounter (Cosign Needed)
  Chronic Care Management   Outreach Note  12/06/2019 Name: Teresa Golden MRN: [id number] DOB: 09/06/1945  Referred by: Glendale Chard, MD Reason for referral : Chronic Care Management   Voice message received from patient requesting a call back concerning [place] transportation. Secure message sent to the embedded BSW Daneen Schick requesting she outreach patient to assist. The patient was referred to the case management team for assistance with care management and care coordination.   Follow Up Plan: The care management team will reach out to the patient again over the next 7 days.   Barb Merino, RN, BSN, CCM Care Management Coordinator [place] [facility]  Direct Phone: [phone]

## 2019-12-06 NOTE — Patient Instructions (Signed)
Social Worker Visit Information  Goals we discussed today:  Goals Addressed            This Visit's Progress   . Collaborate with RN Care Manager to perform appropriate assessments to assist with care coordination needs       CARE PLAN ENTRY (see longitudinal plan of care for additional care plan information)  Current Barriers:  . Awaiting follow up with therapist to establish care . Chronic conditions including DM II, CKD III, and HTN which put patient at increased risk for hospitalization  Social Work Clinical Goal(s):  [name] Over the next 90 days the patient will work with SW to address ongoing care coordination needs  CCM SW Interventions: Completed 12/06/19 . Collaboration with RN Care Manager who requests SW outreach the patient after receiving a voice message regarding Location manager . Successful outbound call placed to the patient to assist with transportation resource needs . Determined the patient currently accesses [facility] and is interested in alternative transportation due to being late for an appointment due to [place] delays o The patient reports she received 2022 plan information in the mail outlining transportation benefit which includes 24 trips for the plan year o The patient reports plans to contact her health plan to determine if she has transportation benefits for 2021 o Advised the patient to contact SW if needed to assist with determining eligibility of benefit . Advised the patient SW spoke with Dr. Baird Cancer who requested the patient wait approximately 2 weeks for [facility] to respond to referral  . Scheduled follow up call over the next two weeks to assess goal progression  Completed 12/05/19 . Inter-disciplinary care team collaboration (see longitudinal plan of care) . Inbound call received from the patient to request follow up on recent referral for therapy to [facility] placed by primary provider during last OV . Discussed the patient has been without a  therapist for "a while" and is eager to begin therapeutic relationship . Advised the patient SW would collaborate with her PCP regarding referral . Collaboration with Dr Baird Cancer who reports referral was placed approximately 1 week ago o Dr Baird Cancer reports patient should give the agency up to two weeks for outreach . Scheduled follow up call to the patient over the next week to determine if contact is received o SW will plan to assist with coordinating care as needed  Patient Self Care Activities:  . Self administers medications as prescribed . Attends all scheduled provider appointments . Calls pharmacy for medication refills . Performs ADL's independently . Performs IADL's independently . Calls provider office for new concerns or questions  Please see past updates related to this goal by clicking on the "Past Updates" button in the selected goal          Follow Up Plan: SW will follow up with patient by phone over the next two weeks.   Daneen Schick, BSW, CDP Social Worker, Certified Dementia Practitioner [place] [facility] [place] [facility] [phone]

## 2019-12-06 NOTE — Patient Instructions (Signed)
Visit Information  Goals Addressed      Patient Stated   .  "I would like to be referred to a new therapist" (pt-stated)        [place] (see longitudinal plan of care for additional care plan information)  Current Barriers:  [name] Knowledge Deficits related to re-evaluation and treatment for Anxiety  . Chronic Disease Management support and education needs related to DM, CKD III, HTN, Class 2 severe obesity, Anxiety   Nurse Case Manager Clinical Goal(s):  [name] Over the next 90 days, patient will work with the CCM team and PCP to address needs related to disease education and support for improved Self health management of Anxiety   CCM RN CM Interventions:  12/02/19 call completed with patient  . Inter-disciplinary care team collaboration (see longitudinal plan of care) . Evaluation of current treatment plan related to Anxiety and patient's adherence to plan as established by provider . Inbound call received from patient stating she needs a referral for a new therapist to evaluate and treat her Anxiety . Determined patient was able to discuss this with Dr. Baird Cancer while in the office for her Flu vaccine for which Dr. Baird Cancer referred her to [facility] . Discussed and reviewed average process time for new referrals and when patient should expect to receive a call from this provider in order to schedule a new patient appointment . Discussed if patient does not receive a call in a timely manner, she will contact this provider to request an appointment  . Discussed plans with patient for ongoing care management follow up and provided patient with direct contact information for care management team  Patient Self Care Activities:  . Self administers medications as prescribed . Attends all scheduled provider appointments . Calls pharmacy for medication refills . Performs ADL's independently . Performs IADL's independently . Calls provider office for new concerns or  questions  Initial goal documentation       Patient verbalizes understanding of instructions provided today.   Telephone follow up appointment with care management team member scheduled for: 01/04/20  Barb Merino, RN, BSN, CCM Care Management Coordinator [place] [facility]  Direct Phone: [phone]

## 2019-12-06 NOTE — Chronic Care Management (AMB) (Signed)
Chronic Care Management   Follow Up Note   12/02/2019 Name: Teresa Golden MRN: [id number] DOB: 02/09/1946  Referred by: Glendale Chard, MD Reason for referral : Chronic Care Management (CCM RNCM FU Call )   Teresa Golden is a 74 y.o. year old female who is a primary care patient of Glendale Chard, MD. The CCM team was consulted for assistance with chronic disease management and care coordination needs.    Review of patient status, including review of consultants reports, relevant laboratory and other test results, and collaboration with appropriate care team members and the patient's provider was performed as part of comprehensive patient evaluation and provision of chronic care management services.    SDOH (Social Determinants of Health) assessments performed: No See Care Plan activities for detailed interventions related to [place])   Inbound call received from patient concerning the request for assistance to obtain a referral for a new therapist.     Outpatient Encounter Medications as of 12/02/2019  Medication Sig   nystatin (MYCOSTATIN/NYSTOP) powder Apply 1 application topically 2 (two) times daily. apply to affected area twice daily as needed   amLODipine (NORVASC) 5 MG tablet Take 1 tablet (5 mg total) by mouth daily.   B Complex-C (B-COMPLEX WITH VITAMIN C) tablet Take 1 tablet by mouth daily.   Biotin 10000 MCG TABS Take 1 tablet by mouth daily.   carvedilol (COREG CR) 20 MG 24 hr capsule TAKE 1 CAPSULE DAILY   Cholecalciferol 5000 units TABS Take 5,000 Units by mouth daily.   Colchicine 0.6 MG CAPS TAKE 1 CAPSULE DAILY AS    NEEDED   Cyanocobalamin (VITAMIN B-12) 2500 MCG SUBL Take 2,500 mcg by mouth daily.   diclofenac Sodium (VOLTAREN) 1 % GEL APPLY 2 GRAMS TO AFFECTED AREA(S) 4 TIMES DAILY AS NEEDED   docusate sodium (COLACE) 100 MG capsule Take 1 capsule (100 mg total) by mouth 2 (two) times daily. (Patient not taking: Reported on 09/30/2019)   famotidine  (PEPCID) 20 MG tablet Take 20 mg by mouth daily. PRN   gabapentin (NEURONTIN) 300 MG capsule Take 300 mg by mouth at bedtime.    insulin degludec (TRESIBA FLEXTOUCH) 100 UNIT/ML SOPN FlexTouch Pen Inject 0.09 mLs (9 Units total) into the skin daily at 10 pm. (Patient taking differently: Inject 8 Units into the skin daily at 10 pm. )   losartan (COZAAR) 100 MG tablet Take 1 tablet (100 mg total) by mouth daily.   losartan (COZAAR) 50 MG tablet 2 tablets 2 times per day   Multiple Vitamins-Minerals (MULTIVITAMIN WITH MINERALS) tablet Take 1 tablet by mouth daily. Women's 50+   naproxen sodium (ANAPROX) 220 MG tablet Take 440 mg by mouth 2 (two) times daily as needed (pain). 2 tablets every night at bedtime   OZEMPIC, 1 MG/DOSE, 2 MG/1.5ML SOPN INJECT INTO SKIN ONCE A WEEK (Patient taking differently: 1mg )   simvastatin (ZOCOR) 10 MG tablet TAKE 1 TABLET DAILY   SYNTHROID 25 MCG tablet Take 0.5 tablet (12.5 mcg) by mouth on Mondays through Fridays, then take 1 tablet (25 mcg) by mouth on Saturdays and Sundays.   No facility-administered encounter medications on file as of 12/02/2019.     Objective:  Lab Results  Component Value Date   HGBA1C 6.7 (H) 11/29/2019   HGBA1C 6.5 (H) 08/18/2019   HGBA1C 6.3 (H) 04/19/2019   Lab Results  Component Value Date   MICROALBUR 10 01/17/2019   LDLCALC 82 01/17/2019   CREATININE 0.96 11/29/2019   BP  Readings from Last 3 Encounters:  11/29/19 130/68  11/29/19 130/68  09/30/19 (!) 150/82    Goals Addressed      Patient Stated     "I would like to be referred to a new therapist" (pt-stated)        [place] (see longitudinal plan of care for additional care plan information)  Current Barriers:   Knowledge Deficits related to re-evaluation and treatment for Anxiety   Chronic Disease Management support and education needs related to DM, CKD III, HTN, Class 2 severe obesity, Anxiety   Nurse Case Manager Clinical Goal(s):   Over  the next 90 days, patient will work with the CCM team and PCP to address needs related to disease education and support for improved Self health management of Anxiety   CCM RN CM Interventions:  12/02/19 call completed with patient   Inter-disciplinary care team collaboration (see longitudinal plan of care)  Evaluation of current treatment plan related to Anxiety and patient's adherence to plan as established by provider  Inbound call received from patient stating she needs a referral for a new therapist to evaluate and treat her Anxiety  Determined patient was able to discuss this with Dr. Baird Cancer while in the office for her Flu vaccine for which Dr. Baird Cancer referred her to [facility]  Discussed and reviewed average process time for new referrals and when patient should expect to receive a call from this provider in order to schedule a new patient appointment  Discussed if patient does not receive a call in a timely manner, she will contact this provider to request an appointment   Discussed plans with patient for ongoing care management follow up and provided patient with direct contact information for care management team  Patient Self Care Activities:   Self administers medications as prescribed  Attends all scheduled provider appointments  Calls pharmacy for medication refills  Performs ADL's independently  Performs IADL's independently  Calls provider office for new concerns or questions  Initial goal documentation       Plan:   Telephone follow up appointment with care management team member scheduled for: 01/04/20  Barb Merino, RN, BSN, CCM Care Management Coordinator [place] [facility]  Direct Phone: [phone]

## 2019-12-06 NOTE — Chronic Care Management (AMB) (Signed)
Chronic Care Management    Social Work Follow Up Note  12/06/2019 Name: Teresa Golden MRN: [id number] DOB: 09/03/1945  Teresa Golden is a 74 y.o. year old female who is a primary care patient of Glendale Chard, MD. The CCM team was consulted for assistance with care coordination.   Review of patient status, including review of consultants reports, other relevant assessments, and collaboration with appropriate care team members and the patient's provider was performed as part of comprehensive patient evaluation and provision of chronic care management services.    SDOH (Social Determinants of Health) assessments performed: No    Outpatient Encounter Medications as of 12/06/2019  Medication Sig  . nystatin (MYCOSTATIN/NYSTOP) powder Apply 1 application topically 2 (two) times daily. apply to affected area twice daily as needed  . amLODipine (NORVASC) 5 MG tablet Take 1 tablet (5 mg total) by mouth daily.  . B Complex-C (B-COMPLEX WITH VITAMIN C) tablet Take 1 tablet by mouth daily.  . Biotin 10000 MCG TABS Take 1 tablet by mouth daily.  . carvedilol (COREG CR) 20 MG 24 hr capsule TAKE 1 CAPSULE DAILY  . Cholecalciferol 5000 units TABS Take 5,000 Units by mouth daily.  . Colchicine 0.6 MG CAPS TAKE 1 CAPSULE DAILY AS    NEEDED  . Cyanocobalamin (VITAMIN B-12) 2500 MCG SUBL Take 2,500 mcg by mouth daily.  . diclofenac Sodium (VOLTAREN) 1 % GEL APPLY 2 GRAMS TO AFFECTED AREA(S) 4 TIMES DAILY AS NEEDED  . docusate sodium (COLACE) 100 MG capsule Take 1 capsule (100 mg total) by mouth 2 (two) times daily. (Patient not taking: Reported on 09/30/2019)  . famotidine (PEPCID) 20 MG tablet Take 20 mg by mouth daily. PRN  . gabapentin (NEURONTIN) 300 MG capsule Take 300 mg by mouth at bedtime.   . insulin degludec (TRESIBA FLEXTOUCH) 100 UNIT/ML SOPN FlexTouch Pen Inject 0.09 mLs (9 Units total) into the skin daily at 10 pm. (Patient taking differently: Inject 8 Units into the skin daily at 10 pm. )  .  losartan (COZAAR) 100 MG tablet Take 1 tablet (100 mg total) by mouth daily.  [name] losartan (COZAAR) 50 MG tablet 2 tablets 2 times per day  . Multiple Vitamins-Minerals (MULTIVITAMIN WITH MINERALS) tablet Take 1 tablet by mouth daily. Women's 50+  . naproxen sodium (ANAPROX) 220 MG tablet Take 440 mg by mouth 2 (two) times daily as needed (pain). 2 tablets every night at bedtime  . OZEMPIC, 1 MG/DOSE, 2 MG/1.5ML SOPN INJECT INTO SKIN ONCE A WEEK (Patient taking differently: 1mg )  . simvastatin (ZOCOR) 10 MG tablet TAKE 1 TABLET DAILY  . SYNTHROID 25 MCG tablet Take 0.5 tablet (12.5 mcg) by mouth on Mondays through Fridays, then take 1 tablet (25 mcg) by mouth on Saturdays and Sundays.   No facility-administered encounter medications on file as of 12/06/2019.     Goals Addressed            This Visit's Progress   . Collaborate with RN Care Manager to perform appropriate assessments to assist with care coordination needs       CARE PLAN ENTRY (see longitudinal plan of care for additional care plan information)  Current Barriers:  . Awaiting follow up with therapist to establish care . Chronic conditions including DM II, CKD III, and HTN which put patient at increased risk for hospitalization  Social Work Clinical Goal(s):  [name] Over the next 90 days the patient will work with SW to address ongoing care coordination needs  CCM SW Interventions: Completed 12/06/19 . Collaboration with RN Care Manager who requests SW outreach the patient after receiving a voice message regarding Location manager . Successful outbound call placed to the patient to assist with transportation resource needs . Determined the patient currently accesses [facility] and is interested in alternative transportation due to being late for an appointment due to [place] delays o The patient reports she received 2022 plan information in the mail outlining transportation benefit which includes 24 trips for the plan year o The  patient reports plans to contact her health plan to determine if she has transportation benefits for 2021 o Advised the patient to contact SW if needed to assist with determining eligibility of benefit . Advised the patient SW spoke with Dr. Baird Cancer who requested the patient wait approximately 2 weeks for [facility] to respond to referral  . Scheduled follow up call over the next two weeks to assess goal progression  Completed 12/05/19 . Inter-disciplinary care team collaboration (see longitudinal plan of care) . Inbound call received from the patient to request follow up on recent referral for therapy to [facility] placed by primary provider during last OV . Discussed the patient has been without a therapist for "a while" and is eager to begin therapeutic relationship . Advised the patient SW would collaborate with her PCP regarding referral . Collaboration with Dr Baird Cancer who reports referral was placed approximately 1 week ago o Dr Baird Cancer reports patient should give the agency up to two weeks for outreach . Scheduled follow up call to the patient over the next week to determine if contact is received o SW will plan to assist with coordinating care as needed  Patient Self Care Activities:  . Self administers medications as prescribed . Attends all scheduled provider appointments . Calls pharmacy for medication refills . Performs ADL's independently . Performs IADL's independently . Calls provider office for new concerns or questions  Please see past updates related to this goal by clicking on the "Past Updates" button in the selected goal          Follow Up Plan: SW will follow up with patient by phone over the next two weeks.   Daneen Schick, BSW, CDP Social Worker, Certified Dementia Practitioner [place] [facility] [facility] [phone]  Total time spent performing care coordination and/or care management activities with the patient by phone or face to face = 8 minutes.

## 2019-12-08 ENCOUNTER — Other Ambulatory Visit: Payer: Medicare HMO

## 2019-12-09 ENCOUNTER — Telehealth: Payer: Medicare HMO

## 2019-12-12 ENCOUNTER — Ambulatory Visit: Payer: Medicare HMO | Admitting: Psychology

## 2019-12-13 ENCOUNTER — Telehealth: Payer: Self-pay | Admitting: Pharmacist

## 2019-12-15 ENCOUNTER — Other Ambulatory Visit: Payer: Medicare HMO

## 2019-12-16 ENCOUNTER — Telehealth: Payer: Self-pay

## 2019-12-16 ENCOUNTER — Ambulatory Visit: Payer: Medicare HMO

## 2019-12-16 DIAGNOSIS — I129 Hypertensive chronic kidney disease with stage 1 through stage 4 chronic kidney disease, or unspecified chronic kidney disease: Secondary | ICD-10-CM

## 2019-12-16 DIAGNOSIS — E1122 Type 2 diabetes mellitus with diabetic chronic kidney disease: Secondary | ICD-10-CM

## 2019-12-16 DIAGNOSIS — N1831 Chronic kidney disease, stage 3a: Secondary | ICD-10-CM

## 2019-12-16 DIAGNOSIS — F419 Anxiety disorder, unspecified: Secondary | ICD-10-CM

## 2019-12-16 NOTE — Patient Instructions (Signed)
Social Worker Visit Information  Goals we discussed today:  Goals Addressed            This Visit's Progress   . Collaborate with RN Care Manager to perform appropriate assessments to assist with care coordination needs   On track    [place] (see longitudinal plan of care for additional care plan information)  Current Barriers:  . Awaiting follow up with therapist to establish care . Chronic conditions including DM II, CKD III, and HTN which put patient at increased risk for hospitalization  Social Work Clinical Goal(s):  [name] Over the next 90 days the patient will work with SW to address ongoing care coordination needs  CCM SW Interventions: Completed 12/16/19 . Inbound call received from the patient to request assistance surrounding therapy referral o The patient reports she had not heard from [place] for a phone number to realize it is an addiction recovery center . Advised the patient that there is a treatment center named [facility] in [place] but there is also a [employer] account planner with psychiatrists and counselors called [facility] . Provided the patient with the contact number to [facility] on [address]. [phone])  . Advised the patient to contact [facility] to follow up on referrals for therapy . Received inbound call from the patient who reports she contacted [facility] who reported they had yet to receive a referral for the patient o Patient provided demographics as well as insurance information to verify benefits . Patient reports she was told she will most likely not receive an appointment until December or January . Scheduled follow up call to the patient over the next two weeks   Completed 12/06/19 . Collaboration with RN Care Manager who requests SW outreach the patient after receiving a voice message regarding Location manager . Successful outbound call placed to the patient to assist with transportation resource  needs . Determined the patient currently accesses [facility] and is interested in alternative transportation due to being late for an appointment due to [place] delays o The patient reports she received 2022 plan information in the mail outlining transportation benefit which includes 24 trips for the plan year o The patient reports plans to contact her health plan to determine if she has transportation benefits for 2021 o Advised the patient to contact SW if needed to assist with determining eligibility of benefit . Advised the patient SW spoke with Dr. Baird Cancer who requested the patient wait approximately 2 weeks for [facility] to respond to referral  . Scheduled follow up call over the next two weeks to assess goal progression  Completed 12/05/19 . Inter-disciplinary care team collaboration (see longitudinal plan of care) . Inbound call received from the patient to request follow up on recent referral for therapy to [facility] placed by primary provider during last OV . Discussed the patient has been without a therapist for "a while" and is eager to begin therapeutic relationship . Advised the patient SW would collaborate with her PCP regarding referral . Collaboration with Dr Baird Cancer who reports referral was placed approximately 1 week ago o Dr Baird Cancer reports patient should give the agency up to two weeks for outreach . Scheduled follow up call to the patient over the next week to determine if contact is received o SW will plan to assist with coordinating care as needed  Patient Self Care Activities:  . Self administers medications as prescribed . Attends all scheduled provider appointments . Calls pharmacy for medication refills . Performs ADL's  independently . Performs IADL's independently . Calls provider office for new concerns or questions  Please see past updates related to this goal by clicking on the "Past Updates" button in the selected goal          Follow Up Plan: SW will follow  up with patient by phone over the next two weeks   Daneen Schick, BSW, CDP Social Worker, Certified Dementia Practitioner [place] [facility] [place] [facility] [phone]

## 2019-12-16 NOTE — Chronic Care Management (AMB) (Signed)
Chronic Care Management    Social Work Follow Up Note  12/16/2019 Name: Teresa Golden MRN: [id number] DOB: [birth year]  Teresa Golden is a 74 y.o. year old female who is a primary care patient of Glendale Chard, MD. The CCM team was consulted for assistance with care coordination.   Review of patient status, including review of consultants reports, other relevant assessments, and collaboration with appropriate care team members and the patient's provider was performed as part of comprehensive patient evaluation and provision of chronic care management services.    SDOH (Social Determinants of Health) assessments performed: No    Outpatient Encounter Medications as of 12/16/2019  Medication Sig   nystatin (MYCOSTATIN/NYSTOP) powder Apply 1 application topically 2 (two) times daily. apply to affected area twice daily as needed   amLODipine (NORVASC) 5 MG tablet Take 1 tablet (5 mg total) by mouth daily.   B Complex-C (B-COMPLEX WITH VITAMIN C) tablet Take 1 tablet by mouth daily.   Biotin 10000 MCG TABS Take 1 tablet by mouth daily.   carvedilol (COREG CR) 20 MG 24 hr capsule TAKE 1 CAPSULE DAILY   Cholecalciferol 5000 units TABS Take 5,000 Units by mouth daily.   Colchicine 0.6 MG CAPS TAKE 1 CAPSULE DAILY AS    NEEDED   Cyanocobalamin (VITAMIN B-12) 2500 MCG SUBL Take 2,500 mcg by mouth daily.   diclofenac Sodium (VOLTAREN) 1 % GEL APPLY 2 GRAMS TO AFFECTED AREA(S) 4 TIMES DAILY AS NEEDED   docusate sodium (COLACE) 100 MG capsule Take 1 capsule (100 mg total) by mouth 2 (two) times daily. (Patient not taking: Reported on 09/30/2019)   famotidine (PEPCID) 20 MG tablet Take 20 mg by mouth daily. PRN   gabapentin (NEURONTIN) 300 MG capsule Take 300 mg by mouth at bedtime.    insulin degludec (TRESIBA FLEXTOUCH) 100 UNIT/ML SOPN FlexTouch Pen Inject 0.09 mLs (9 Units total) into the skin daily at 10 pm. (Patient taking differently: Inject 8 Units into the skin daily at 10 pm. )    losartan (COZAAR) 100 MG tablet Take 1 tablet (100 mg total) by mouth daily.   losartan (COZAAR) 50 MG tablet 2 tablets 2 times per day   Multiple Vitamins-Minerals (MULTIVITAMIN WITH MINERALS) tablet Take 1 tablet by mouth daily. Women's 50+   naproxen sodium (ANAPROX) 220 MG tablet Take 440 mg by mouth 2 (two) times daily as needed (pain). 2 tablets every night at bedtime   OZEMPIC, 1 MG/DOSE, 2 MG/1.5ML SOPN INJECT INTO SKIN ONCE A WEEK (Patient taking differently: 1mg )   simvastatin (ZOCOR) 10 MG tablet TAKE 1 TABLET DAILY   SYNTHROID 25 MCG tablet Take 0.5 tablet (12.5 mcg) by mouth on Mondays through Fridays, then take 1 tablet (25 mcg) by mouth on Saturdays and Sundays.   No facility-administered encounter medications on file as of 12/16/2019.     Goals Addressed            This Visit's Progress    Collaborate with RN Care Manager to perform appropriate assessments to assist with care coordination needs   On track    [place] (see longitudinal plan of care for additional care plan information)  Current Barriers:   Awaiting follow up with therapist to establish care  Chronic conditions including DM II, CKD III, and HTN which put patient at increased risk for hospitalization  Social Work Clinical Goal(s):   Over the next 90 days the patient will work with SW to address ongoing care coordination needs  CCM SW Interventions: Completed 12/16/19  Inbound call received from the patient to request assistance surrounding therapy referral o The patient reports she had not heard from [facility] for a phone number to realize it is an addiction recovery center  Advised the patient that there is a treatment center named [facility] in [place] but there is also a [employer] account planner with psychiatrists and counselors called [facility]  Provided the patient with the contact number to [facility] on [address]. [phone])   Advised the patient to  contact [facility] to follow up on referrals for therapy  Received inbound call from the patient who reports she contacted [facility] who reported they had yet to receive a referral for the patient o Patient provided demographics as well as insurance information to verify benefits  Patient reports she was told she will most likely not receive an appointment until December or January  Scheduled follow up call to the patient over the next two weeks   Completed 12/06/19  Collaboration with RN Care Manager who requests SW outreach the patient after receiving a voice message regarding Location manager  Successful outbound call placed to the patient to assist with transportation resource needs  Determined the patient currently accesses [facility] and is interested in alternative transportation due to being late for an appointment due to [place] delays o The patient reports she received 2022 plan information in the mail outlining transportation benefit which includes 24 trips for the plan year o The patient reports plans to contact her health plan to determine if she has transportation benefits for 2021 o Advised the patient to contact SW if needed to assist with determining eligibility of benefit  Advised the patient SW spoke with Dr. Baird Cancer who requested the patient wait approximately 2 weeks for [facility] to respond to referral   Scheduled follow up call over the next two weeks to assess goal progression  Completed 12/05/19  Inter-disciplinary care team collaboration (see longitudinal plan of care)  Inbound call received from the patient to request follow up on recent referral for therapy to [facility] placed by primary provider during last OV  Discussed the patient has been without a therapist for "a while" and is eager to begin therapeutic relationship  Advised the patient SW would collaborate with her PCP regarding referral  Collaboration with Dr Baird Cancer who reports referral was placed  approximately 1 week ago o Dr Baird Cancer reports patient should give the agency up to two weeks for outreach  Scheduled follow up call to the patient over the next week to determine if contact is received o SW will plan to assist with coordinating care as needed  Patient Self Care Activities:   Self administers medications as prescribed  Attends all scheduled provider appointments  Calls pharmacy for medication refills  Performs ADL's independently  Performs IADL's independently  Calls provider office for new concerns or questions  Please see past updates related to this goal by clicking on the "Past Updates" button in the selected goal          Follow Up Plan: SW will follow up with patient by phone over the next two weeks.   Daneen Schick, BSW, CDP Social Worker, Certified Dementia Practitioner [place] [facility] [phone]  Total time spent performing care coordination and/or care management activities with the patient by phone or face to face = 20 minutes.

## 2019-12-16 NOTE — Telephone Encounter (Signed)
°  Chronic Care Management   Outreach Note  12/16/2019 Name: NICHOLA CIESLINSKI MRN: [id number] DOB: [birth year]  Referred by: Glendale Chard, MD Reason for referral : Care Coordination   SW placed an unsuccessful outbound call to the patient in response to a voice message received. SW left a HIPAA compliant voice message requesting a return call.   Daneen Schick, BSW, CDP Social Worker, Certified Dementia Practitioner [place] [facility] [phone]

## 2019-12-19 ENCOUNTER — Telehealth: Payer: Medicare HMO

## 2019-12-22 ENCOUNTER — Ambulatory Visit
Admission: RE | Admit: 2019-12-22 | Discharge: 2019-12-22 | Disposition: A | Discharge status: Home / Self Care | Payer: Medicare HMO | Source: Ambulatory Visit | Attending: Internal Medicine | Admitting: Internal Medicine

## 2019-12-22 ENCOUNTER — Other Ambulatory Visit: Payer: Self-pay | Admitting: Nurse Practitioner

## 2019-12-22 DIAGNOSIS — K7469 Other cirrhosis of liver: Secondary | ICD-10-CM

## 2019-12-22 DIAGNOSIS — R102 Pelvic and perineal pain: Secondary | ICD-10-CM

## 2019-12-24 NOTE — Chronic Care Management (AMB) (Signed)
Chronic Care Management Pharmacy Assistant   Name: Teresa Golden  MRN: [id number] DOB: 1945/06/03  Reason for Encounter: Medication Review/ Refill Review  PCP : Glendale Chard, MD  12/13/2019- [facility] inquired if patient is need of her Amlodipine 5 mg due to refill due date due. Called patient, left message to return call inquiring if she is ready for her Amlodipine to be delivered. Patient returned call, she was very frustrated on why we are calling and if she needs the refill then we should just send the refills. Tried to explain to patient on why we call prior to delivering but patient put me on hold and phone disconnected.  Called patient back, left message to return call. Patient then called right back but I missed call.  Patient called back a few minutes later she explained to me that her transportation was there to pick her up that's why the phone call disconnected. Patient began to inquire why she has to be called before refill and she is considering going [facility] to [facility], this was too much trouble for her. Explained to patient that with our Adherence program, we contact out patient to make sure we are sending the correct medications, there could be changes in medicine from providers within and/or outside the [facility] system that we may not be aware of. We do not want to bill insurance if medication is not needed, we are here to save patients money and we make sure that an abundance of medications are not on hand due to the risk of taking too much which could cause health problems. We also gather any complaints or needs from our patient that can be relayed to their PCP, for examples if blood pressure are still high with dosage, there could be a need to increase or change medication. Explained that our reasons for calling are only to help her and because our pharmacy delivers, we want to make sure we call prior to delivery for the safety of all medications. Patient aware, she fully  understands and appreciates the explanation, patient would like refill of Amlodipine 5 mg today and states if she is not home the delivery drive should know where to place. Information relayed to [facility] and pharmacist Teresa Golden, CPP.   Allergies:   Allergies  Allergen Reactions  . Meloxicam Other (See Comments)    Fever; muscle aches; "flu-like" symptoms  . Other     Rose fever and hay fever     Medications: Outpatient Encounter Medications as of 12/13/2019  Medication Sig  . nystatin (MYCOSTATIN/NYSTOP) powder Apply 1 application topically 2 (two) times daily. apply to affected area twice daily as needed  . amLODipine (NORVASC) 5 MG tablet Take 1 tablet (5 mg total) by mouth daily.  . B Complex-C (B-COMPLEX WITH VITAMIN C) tablet Take 1 tablet by mouth daily.  . Biotin 10000 MCG TABS Take 1 tablet by mouth daily.  . carvedilol (COREG CR) 20 MG 24 hr capsule TAKE 1 CAPSULE DAILY  . Cholecalciferol 5000 units TABS Take 5,000 Units by mouth daily.  . Colchicine 0.6 MG CAPS TAKE 1 CAPSULE DAILY AS    NEEDED  . Cyanocobalamin (VITAMIN B-12) 2500 MCG SUBL Take 2,500 mcg by mouth daily.  . diclofenac Sodium (VOLTAREN) 1 % GEL APPLY 2 GRAMS TO AFFECTED AREA(S) 4 TIMES DAILY AS NEEDED  . docusate sodium (COLACE) 100 MG capsule Take 1 capsule (100 mg total) by mouth 2 (two) times daily. (Patient not taking: Reported on  09/30/2019)  . famotidine (PEPCID) 20 MG tablet Take 20 mg by mouth daily. PRN  . gabapentin (NEURONTIN) 300 MG capsule Take 300 mg by mouth at bedtime.   . insulin degludec (TRESIBA FLEXTOUCH) 100 UNIT/ML SOPN FlexTouch Pen Inject 0.09 mLs (9 Units total) into the skin daily at 10 pm. (Patient taking differently: Inject 8 Units into the skin daily at 10 pm. )  . losartan (COZAAR) 100 MG tablet Take 1 tablet (100 mg total) by mouth daily.  [name] losartan (COZAAR) 50 MG tablet 2 tablets 2 times per day  . Multiple Vitamins-Minerals (MULTIVITAMIN WITH MINERALS) tablet Take 1  tablet by mouth daily. Women's 50+  . naproxen sodium (ANAPROX) 220 MG tablet Take 440 mg by mouth 2 (two) times daily as needed (pain). 2 tablets every night at bedtime  . OZEMPIC, 1 MG/DOSE, 2 MG/1.5ML SOPN INJECT INTO SKIN ONCE A WEEK (Patient taking differently: 1mg )  . simvastatin (ZOCOR) 10 MG tablet TAKE 1 TABLET DAILY  . SYNTHROID 25 MCG tablet Take 0.5 tablet (12.5 mcg) by mouth on Mondays through Fridays, then take 1 tablet (25 mcg) by mouth on Saturdays and Sundays.   No facility-administered encounter medications on file as of 12/13/2019.    Current Diagnosis: Patient Active Problem List   Diagnosis Date Noted  . Type 2 diabetes mellitus with stage 3 chronic kidney disease, with long-term current use of insulin ([place]) 11/29/2019  . Panniculitis 09/30/2019  . Back pain 09/30/2019  . Hepatoma ([place]) 01/21/2019  . Other cirrhosis of liver ([place]) 07/05/2018  . Hypertensive nephropathy 07/05/2018  . Chronic renal disease, stage III ([place]) 07/05/2018  . Chronic left shoulder pain 07/05/2018  . Hypothyroidism 02/06/2018  . Hepatitis C virus infection cured after antiviral drug therapy 02/04/2018  . Osteoarthritis of right knee 07/30/2017  . S/P laparoscopic sleeve gastrectomy July 2018 08/26/2016  . Preop cardiovascular exam 07/30/2016  . Dyspnea on exertion 07/30/2016  . Class 2 severe obesity due to excess calories with serious comorbidity and body mass index (BMI) of 39.0 to 39.9 in adult ([place]) 07/30/2016  . Bilateral lower extremity edema 07/30/2016     Follow-Up:  Coordination of Enhanced Pharmacy Services and Pharmacist Review- Amlodipine 5 mg- 1 tablet daily was delivered on 12/13/2019 for 90 day supply.  Teresa Golden, [place] Pharmacist Assistant [phone]

## 2019-12-28 ENCOUNTER — Ambulatory Visit: Payer: Medicare HMO

## 2019-12-28 DIAGNOSIS — E1122 Type 2 diabetes mellitus with diabetic chronic kidney disease: Secondary | ICD-10-CM

## 2019-12-28 DIAGNOSIS — I129 Hypertensive chronic kidney disease with stage 1 through stage 4 chronic kidney disease, or unspecified chronic kidney disease: Secondary | ICD-10-CM

## 2019-12-28 DIAGNOSIS — F419 Anxiety disorder, unspecified: Secondary | ICD-10-CM

## 2019-12-28 DIAGNOSIS — N1831 Chronic kidney disease, stage 3a: Secondary | ICD-10-CM

## 2019-12-28 NOTE — Chronic Care Management (AMB) (Signed)
Chronic Care Management    Social Work Follow Up Note  12/28/2019 Name: Teresa Golden MRN: [id number] DOB: [birth year]  Teresa Golden is a 74 y.o. year old female who is a primary care patient of Glendale Chard, MD. The CCM team was consulted for assistance with care coordination.   Review of patient status, including review of consultants reports, other relevant assessments, and collaboration with appropriate care team members and the patient's provider was performed as part of comprehensive patient evaluation and provision of chronic care management services.    SDOH (Social Determinants of Health) assessments performed: No    Outpatient Encounter Medications as of 12/28/2019  Medication Sig  . nystatin (MYCOSTATIN/NYSTOP) powder Apply 1 application topically 2 (two) times daily. apply to affected area twice daily as needed  . amLODipine (NORVASC) 5 MG tablet Take 1 tablet (5 mg total) by mouth daily.  . B Complex-C (B-COMPLEX WITH VITAMIN C) tablet Take 1 tablet by mouth daily.  . Biotin 10000 MCG TABS Take 1 tablet by mouth daily.  . carvedilol (COREG CR) 20 MG 24 hr capsule TAKE 1 CAPSULE DAILY  . Cholecalciferol 5000 units TABS Take 5,000 Units by mouth daily.  . Colchicine 0.6 MG CAPS TAKE 1 CAPSULE DAILY AS    NEEDED  . Cyanocobalamin (VITAMIN B-12) 2500 MCG SUBL Take 2,500 mcg by mouth daily.  . diclofenac Sodium (VOLTAREN) 1 % GEL APPLY 2 GRAMS TO AFFECTED AREA(S) 4 TIMES DAILY AS NEEDED  . docusate sodium (COLACE) 100 MG capsule Take 1 capsule (100 mg total) by mouth 2 (two) times daily. (Patient not taking: Reported on 09/30/2019)  . famotidine (PEPCID) 20 MG tablet Take 20 mg by mouth daily. PRN  . gabapentin (NEURONTIN) 300 MG capsule Take 300 mg by mouth at bedtime.   . insulin degludec (TRESIBA FLEXTOUCH) 100 UNIT/ML SOPN FlexTouch Pen Inject 0.09 mLs (9 Units total) into the skin daily at 10 pm. (Patient taking differently: Inject 8 Units into the skin daily at 10 pm. )  .  losartan (COZAAR) 100 MG tablet Take 1 tablet (100 mg total) by mouth daily.  [name] losartan (COZAAR) 50 MG tablet 2 tablets 2 times per day  . Multiple Vitamins-Minerals (MULTIVITAMIN WITH MINERALS) tablet Take 1 tablet by mouth daily. Women's 50+  . naproxen sodium (ANAPROX) 220 MG tablet Take 440 mg by mouth 2 (two) times daily as needed (pain). 2 tablets every night at bedtime  . OZEMPIC, 1 MG/DOSE, 2 MG/1.5ML SOPN INJECT INTO SKIN ONCE A WEEK (Patient taking differently: 1mg )  . simvastatin (ZOCOR) 10 MG tablet TAKE 1 TABLET DAILY  . SYNTHROID 25 MCG tablet Take 0.5 tablet (12.5 mcg) by mouth on Mondays through Fridays, then take 1 tablet (25 mcg) by mouth on Saturdays and Sundays.   No facility-administered encounter medications on file as of 12/28/2019.     Goals Addressed            This Visit's Progress   . Collaborate with RN Care Manager to perform appropriate assessments to assist with care coordination needs       CARE PLAN ENTRY (see longitudinal plan of care for additional care plan information)  Current Barriers:  . Awaiting follow up with therapist to establish care . Chronic conditions including DM II, CKD III, and HTN which put patient at increased risk for hospitalization  Social Work Clinical Goal(s):  [name] Over the next 90 days the patient will work with SW to address ongoing care coordination needs  CCM SW Interventions: Completed 12/28/19 . Successful outbound call placed to the patient to assess goal progression . Determined the patient has yet to receive an appointment with a counselor through [facility] . Discussed difficulty locating [facility] providers as well as scheduling delays due to demand . Advised patient SW would collaborate with Dr. Baird Cancer to inquire if other providers may be available  o Patient reports she was referred to one in the past and did not like them but may be open to other suggestions . Collaboration with Dr. Baird Cancer to discuss patient concern  regarding appointment delays . Scheduled follow up call to the patient over the next week  Completed 12/16/19 . Inbound call received from the patient to request assistance surrounding therapy referral o The patient reports she had not heard from [facility] for a phone number to realize it is an addiction recovery center . Advised the patient that there is a treatment center named [facility] in [place] but there is also a [employer] account planner with psychiatrists and counselors called [facility] . Provided the patient with the contact number to [facility] on [address]. [phone])  . Advised the patient to contact [facility] to follow up on referrals for therapy . Received inbound call from the patient who reports she contacted [facility] who reported they had yet to receive a referral for the patient o Patient provided demographics as well as insurance information to verify benefits . Patient reports she was told she will most likely not receive an appointment until December or January . Scheduled follow up call to the patient over the next two weeks   Completed 12/06/19 . Collaboration with RN Care Manager who requests SW outreach the patient after receiving a voice message regarding Location manager . Successful outbound call placed to the patient to assist with transportation resource needs . Determined the patient currently accesses [facility] and is interested in alternative transportation due to being late for an appointment due to [place] delays o The patient reports she received 2022 plan information in the mail outlining transportation benefit which includes 24 trips for the plan year o The patient reports plans to contact her health plan to determine if she has transportation benefits for 2021 o Advised the patient to contact SW if needed to assist with determining eligibility of benefit . Advised the patient SW spoke with Dr. Baird Cancer who requested the patient  wait approximately 2 weeks for [facility] to respond to referral  . Scheduled follow up call over the next two weeks to assess goal progression  Completed 12/05/19 . Inter-disciplinary care team collaboration (see longitudinal plan of care) . Inbound call received from the patient to request follow up on recent referral for therapy to [facility] placed by primary provider during last OV . Discussed the patient has been without a therapist for "a while" and is eager to begin therapeutic relationship . Advised the patient SW would collaborate with her PCP regarding referral . Collaboration with Dr Baird Cancer who reports referral was placed approximately 1 week ago o Dr Baird Cancer reports patient should give the agency up to two weeks for outreach . Scheduled follow up call to the patient over the next week to determine if contact is received o SW will plan to assist with coordinating care as needed  Patient Self Care Activities:  . Self administers medications as prescribed . Attends all scheduled provider appointments . Calls pharmacy for medication refills . Performs ADL's independently . Performs IADL's independently . Calls provider office for  new concerns or questions  Please see past updates related to this goal by clicking on the "Past Updates" button in the selected goal          Follow Up Plan: SW will follow up with patient by phone over the next week.   Daneen Schick, BSW, CDP Social Worker, Certified Dementia Practitioner [place] [facility] [phone]  Total time spent performing care coordination and/or care management activities with the patient by phone or face to face = 10 minutes.

## 2019-12-28 NOTE — Patient Instructions (Signed)
Social Worker Visit Information  Goals we discussed today:  Goals Addressed            This Visit's Progress   . Collaborate with RN Care Manager to perform appropriate assessments to assist with care coordination needs       CARE PLAN ENTRY (see longitudinal plan of care for additional care plan information)  Current Barriers:  . Awaiting follow up with therapist to establish care . Chronic conditions including DM II, CKD III, and HTN which put patient at increased risk for hospitalization  Social Work Clinical Goal(s):  [name] Over the next 90 days the patient will work with SW to address ongoing care coordination needs  CCM SW Interventions: Completed 12/28/19 . Successful outbound call placed to the patient to assess goal progression . Determined the patient has yet to receive an appointment with a counselor through [facility] . Discussed difficulty locating [facility] providers as well as scheduling delays due to demand . Advised patient SW would collaborate with Dr. Baird Cancer to inquire if other providers may be available  o Patient reports she was referred to one in the past and did not like them but may be open to other suggestions . Collaboration with Dr. Baird Cancer to discuss patient concern regarding appointment delays . Scheduled follow up call to the patient over the next week  Completed 12/16/19 . Inbound call received from the patient to request assistance surrounding therapy referral o The patient reports she had not heard from [facility] for a phone number to realize it is an addiction recovery center . Advised the patient that there is a treatment center named [facility] in [place] but there is also a [employer] account planner with psychiatrists and counselors called [facility] . Provided the patient with the contact number to [facility] on [address]. [phone])  . Advised the patient to contact [facility] to follow up on referrals for therapy . Received  inbound call from the patient who reports she contacted [facility] who reported they had yet to receive a referral for the patient o Patient provided demographics as well as insurance information to verify benefits . Patient reports she was told she will most likely not receive an appointment until December or January . Scheduled follow up call to the patient over the next two weeks   Completed 12/06/19 . Collaboration with RN Care Manager who requests SW outreach the patient after receiving a voice message regarding Location manager . Successful outbound call placed to the patient to assist with transportation resource needs . Determined the patient currently accesses [facility] and is interested in alternative transportation due to being late for an appointment due to [place] delays o The patient reports she received 2022 plan information in the mail outlining transportation benefit which includes 24 trips for the plan year o The patient reports plans to contact her health plan to determine if she has transportation benefits for 2021 o Advised the patient to contact SW if needed to assist with determining eligibility of benefit . Advised the patient SW spoke with Dr. Baird Cancer who requested the patient wait approximately 2 weeks for [facility] to respond to referral  . Scheduled follow up call over the next two weeks to assess goal progression  Completed 12/05/19 . Inter-disciplinary care team collaboration (see longitudinal plan of care) . Inbound call received from the patient to request follow up on recent referral for therapy to [facility] placed by primary provider during last OV . Discussed the patient has been without a  therapist for "a while" and is eager to begin therapeutic relationship . Advised the patient SW would collaborate with her PCP regarding referral . Collaboration with Dr Baird Cancer who reports referral was placed approximately 1 week ago o Dr Baird Cancer reports patient should give  the agency up to two weeks for outreach . Scheduled follow up call to the patient over the next week to determine if contact is received o SW will plan to assist with coordinating care as needed  Patient Self Care Activities:  . Self administers medications as prescribed . Attends all scheduled provider appointments . Calls pharmacy for medication refills . Performs ADL's independently . Performs IADL's independently . Calls provider office for new concerns or questions  Please see past updates related to this goal by clicking on the "Past Updates" button in the selected goal         Follow Up Plan: SW will follow up with patient by phone over the next week.   Daneen Schick, BSW, CDP Social Worker, Certified Dementia Practitioner [facility] [facility] [phone]

## 2019-12-29 ENCOUNTER — Ambulatory Visit: Payer: Self-pay

## 2019-12-29 ENCOUNTER — Other Ambulatory Visit: Payer: Self-pay

## 2019-12-29 ENCOUNTER — Telehealth: Payer: Medicare HMO

## 2019-12-29 DIAGNOSIS — F419 Anxiety disorder, unspecified: Secondary | ICD-10-CM

## 2019-12-29 DIAGNOSIS — N1831 Chronic kidney disease, stage 3a: Secondary | ICD-10-CM

## 2019-12-29 DIAGNOSIS — E1122 Type 2 diabetes mellitus with diabetic chronic kidney disease: Secondary | ICD-10-CM

## 2019-12-29 DIAGNOSIS — N183 Chronic kidney disease, stage 3 unspecified: Secondary | ICD-10-CM

## 2019-12-29 DIAGNOSIS — I129 Hypertensive chronic kidney disease with stage 1 through stage 4 chronic kidney disease, or unspecified chronic kidney disease: Secondary | ICD-10-CM

## 2019-12-29 MED ORDER — ONETOUCH VERIO VI STRP: ORAL_STRIP | 2 refills | Status: DC

## 2019-12-29 MED ORDER — ONETOUCH DELICA LANCETS 33G MISC: 2 refills | Status: DC

## 2019-12-30 ENCOUNTER — Ambulatory Visit
Admission: RE | Admit: 2019-12-30 | Discharge: 2019-12-30 | Disposition: A | Discharge status: Home / Self Care | Payer: Medicare HMO | Source: Ambulatory Visit | Attending: Nurse Practitioner | Admitting: Nurse Practitioner

## 2019-12-30 DIAGNOSIS — K7469 Other cirrhosis of liver: Secondary | ICD-10-CM

## 2019-12-30 NOTE — Chronic Care Management (AMB) (Signed)
Chronic Care Management   Follow Up Note   12/29/2019 Name: Teresa Golden MRN: [id number] DOB: 09/23/1945  Referred by: Glendale Chard, MD Reason for referral : Chronic Care Management (Inbound Call from patient )   Teresa Golden is a 74 y.o. year old female who is a primary care patient of Glendale Chard, MD. The CCM team was consulted for assistance with chronic disease management and care coordination needs.    Review of patient status, including review of consultants reports, relevant laboratory and other test results, and collaboration with appropriate care team members and the patient's provider was performed as part of comprehensive patient evaluation and provision of chronic care management services.    SDOH (Social Determinants of Health) assessments performed: No See Care Plan activities for detailed interventions related to [place])   Inbound call received from patient to request a refill for glucometer supplies.     Outpatient Encounter Medications as of 12/29/2019  Medication Sig  . nystatin (MYCOSTATIN/NYSTOP) powder Apply 1 application topically 2 (two) times daily. apply to affected area twice daily as needed  . amLODipine (NORVASC) 5 MG tablet Take 1 tablet (5 mg total) by mouth daily.  . B Complex-C (B-COMPLEX WITH VITAMIN C) tablet Take 1 tablet by mouth daily.  . Biotin 10000 MCG TABS Take 1 tablet by mouth daily.  . carvedilol (COREG CR) 20 MG 24 hr capsule TAKE 1 CAPSULE DAILY  . Cholecalciferol 5000 units TABS Take 5,000 Units by mouth daily.  . Colchicine 0.6 MG CAPS TAKE 1 CAPSULE DAILY AS    NEEDED  . Cyanocobalamin (VITAMIN B-12) 2500 MCG SUBL Take 2,500 mcg by mouth daily.  . diclofenac Sodium (VOLTAREN) 1 % GEL APPLY 2 GRAMS TO AFFECTED AREA(S) 4 TIMES DAILY AS NEEDED  . docusate sodium (COLACE) 100 MG capsule Take 1 capsule (100 mg total) by mouth 2 (two) times daily. (Patient not taking: Reported on 09/30/2019)  . famotidine (PEPCID) 20 MG tablet Take 20 mg  by mouth daily. PRN  . gabapentin (NEURONTIN) 300 MG capsule Take 300 mg by mouth at bedtime.   [name] glucose blood (ONETOUCH VERIO) test strip Use as instructed to check blood sugars 2 times per day dx: e11.65  . insulin degludec (TRESIBA FLEXTOUCH) 100 UNIT/ML SOPN FlexTouch Pen Inject 0.09 mLs (9 Units total) into the skin daily at 10 pm. (Patient taking differently: Inject 8 Units into the skin daily at 10 pm. )  . losartan (COZAAR) 100 MG tablet Take 1 tablet (100 mg total) by mouth daily.  [name] losartan (COZAAR) 50 MG tablet 2 tablets 2 times per day  . Multiple Vitamins-Minerals (MULTIVITAMIN WITH MINERALS) tablet Take 1 tablet by mouth daily. Women's 50+  . naproxen sodium (ANAPROX) 220 MG tablet Take 440 mg by mouth 2 (two) times daily as needed (pain). 2 tablets every night at bedtime  . OneTouch Delica Lancets 40J MISC Use as instructed to check blood sugars 2 times per day dx: e11.65  . OZEMPIC, 1 MG/DOSE, 2 MG/1.5ML SOPN INJECT INTO SKIN ONCE A WEEK (Patient taking differently: 1mg )  . simvastatin (ZOCOR) 10 MG tablet TAKE 1 TABLET DAILY  . SYNTHROID 25 MCG tablet Take 0.5 tablet (12.5 mcg) by mouth on Mondays through Fridays, then take 1 tablet (25 mcg) by mouth on Saturdays and Sundays.   No facility-administered encounter medications on file as of 12/29/2019.     Objective:  Lab Results  Component Value Date   HGBA1C 6.7 (H) 11/29/2019   HGBA1C  6.5 (H) 08/18/2019   HGBA1C 6.3 (H) 04/19/2019   Lab Results  Component Value Date   MICROALBUR 10 01/17/2019   LDLCALC 82 01/17/2019   CREATININE 0.96 11/29/2019   BP Readings from Last 3 Encounters:  11/29/19 130/68  11/29/19 130/68  09/30/19 (!) 150/82    Goals Addressed      Patient Stated   .  "I am having trouble managing my blood sugar" (pt-stated)   On track     Current Barriers:  [name] Knowledge Deficits related to Diabetes Mellitus disease process and Self Health Management  . Chronic Disease Management support and  education needs related to DMII, CKDIII, HTN, Class 2 Severe Obesity  Clinical Goal(s):  . 03/01/19 New - Over the next 90 days, patient will work with CCM RN Case Manager to address needs related to disease education and support for Diabetes disease management and weight loss management . New 07/21/19 Over the next 30 days the patient will attend upcomming primary care appointment to assess status of current A1C . New 07/26/19 Over the next 60 days the patient will engage with embedded PharmD to develop an individualized plan of care related to medication management to optimize management of diabetes  CCM RN CM Interventions: 12/29/19 Inbound call completed with patient . Evaluation of current treatment plan related to Diabetes Mellitus and patient's adherence to plan as established by provider . Determined patient's glucometer is broken, she contacted [employer] and requested a new glucometer which will be mailed to her via [employer]ex . Determined patient needs a refill for her lancets and glucose strips  . Sent in basket message to PCP Dr. Baird Cancer requesting Rx for glucose strips and lancets, and this was approved, patient aware . Discussed plans with patient for ongoing care management follow up and provided patient with direct contact information for care management team  Patient Self Care Activities:  . Self administers medications as prescribed . Attends all scheduled provider appointments . Performs ADL's independently  Please see past updates related to this goal by clicking on the "Past Updates" button in the selected goal        Plan:   Telephone follow up appointment with care management team member scheduled for: 02/10/20  Barb Merino, RN, BSN, CCM Care Management Coordinator [facility] [facility]  Direct Phone: [phone]

## 2019-12-30 NOTE — Patient Instructions (Signed)
Visit Information  Goals Addressed      Patient Stated   .  "I am having trouble managing my blood sugar" (pt-stated)   On track     Current Barriers:  [name] Knowledge Deficits related to Diabetes Mellitus disease process and Self Health Management  . Chronic Disease Management support and education needs related to DMII, CKDIII, HTN, Class 2 Severe Obesity  Clinical Goal(s):  . 03/01/19 New - Over the next 90 days, patient will work with CCM RN Case Manager to address needs related to disease education and support for Diabetes disease management and weight loss management . New 07/21/19 Over the next 30 days the patient will attend upcomming primary care appointment to assess status of current A1C . New 07/26/19 Over the next 60 days the patient will engage with embedded PharmD to develop an individualized plan of care related to medication management to optimize management of diabetes  CCM RN CM Interventions: 12/29/19 Inbound call completed with patient . Evaluation of current treatment plan related to Diabetes Mellitus and patient's adherence to plan as established by provider . Determined patient's glucometer is broken, she contacted [employer] and requested a new glucometer which will be mailed to her via fedex . Determined patient needs a refill for her lancets and glucose strips  . Sent in basket message to PCP Dr. Baird Cancer requesting Rx for glucose strips and lancets, and this was approved, patient aware . Discussed plans with patient for ongoing care management follow up and provided patient with direct contact information for care management team  Patient Self Care Activities:  . Self administers medications as prescribed . Attends all scheduled provider appointments . Performs ADL's independently  Please see past updates related to this goal by clicking on the "Past Updates" button in the selected goal        Patient verbalizes understanding of instructions provided today.    Telephone follow up appointment with care management team member scheduled for: 02/10/20  Barb Merino, RN, BSN, CCM Care Management Coordinator [facility]  Direct Phone: [phone]

## 2020-01-02 ENCOUNTER — Ambulatory Visit: Payer: Medicare HMO | Admitting: Internal Medicine

## 2020-01-04 ENCOUNTER — Telehealth: Payer: Self-pay

## 2020-01-04 ENCOUNTER — Ambulatory Visit: Payer: Medicare HMO

## 2020-01-04 DIAGNOSIS — E1122 Type 2 diabetes mellitus with diabetic chronic kidney disease: Secondary | ICD-10-CM

## 2020-01-04 DIAGNOSIS — N183 Chronic kidney disease, stage 3 unspecified: Secondary | ICD-10-CM

## 2020-01-04 DIAGNOSIS — N1831 Chronic kidney disease, stage 3a: Secondary | ICD-10-CM

## 2020-01-04 NOTE — Chronic Care Management (AMB) (Signed)
Chronic Care Management    Social Work Follow Up Note  01/04/2020 Name: Teresa Golden MRN: [id number] DOB: April 04, 1945  Teresa Golden is a 74 y.o. year old female who is a primary care patient of Glendale Chard, MD. The CCM team was consulted for assistance with care coordination.   Review of patient status, including review of consultants reports, other relevant assessments, and collaboration with appropriate care team members and the patient's provider was performed as part of comprehensive patient evaluation and provision of chronic care management services.    SDOH (Social Determinants of Health) assessments performed: No    Outpatient Encounter Medications as of 01/04/2020  Medication Sig   nystatin (MYCOSTATIN/NYSTOP) powder Apply 1 application topically 2 (two) times daily. apply to affected area twice daily as needed   amLODipine (NORVASC) 5 MG tablet Take 1 tablet (5 mg total) by mouth daily.   B Complex-C (B-COMPLEX WITH VITAMIN C) tablet Take 1 tablet by mouth daily.   Biotin 10000 MCG TABS Take 1 tablet by mouth daily.   carvedilol (COREG CR) 20 MG 24 hr capsule TAKE 1 CAPSULE DAILY   Cholecalciferol 5000 units TABS Take 5,000 Units by mouth daily.   Colchicine 0.6 MG CAPS TAKE 1 CAPSULE DAILY AS    NEEDED   Cyanocobalamin (VITAMIN B-12) 2500 MCG SUBL Take 2,500 mcg by mouth daily.   diclofenac Sodium (VOLTAREN) 1 % GEL APPLY 2 GRAMS TO AFFECTED AREA(S) 4 TIMES DAILY AS NEEDED   docusate sodium (COLACE) 100 MG capsule Take 1 capsule (100 mg total) by mouth 2 (two) times daily. (Patient not taking: Reported on 09/30/2019)   famotidine (PEPCID) 20 MG tablet Take 20 mg by mouth daily. PRN   gabapentin (NEURONTIN) 300 MG capsule Take 300 mg by mouth at bedtime.    glucose blood (ONETOUCH VERIO) test strip Use as instructed to check blood sugars 2 times per day dx: e11.65   insulin degludec (TRESIBA FLEXTOUCH) 100 UNIT/ML SOPN FlexTouch Pen Inject 0.09 mLs (9 Units  total) into the skin daily at 10 pm. (Patient taking differently: Inject 8 Units into the skin daily at 10 pm. )   losartan (COZAAR) 100 MG tablet Take 1 tablet (100 mg total) by mouth daily.   losartan (COZAAR) 50 MG tablet 2 tablets 2 times per day   Multiple Vitamins-Minerals (MULTIVITAMIN WITH MINERALS) tablet Take 1 tablet by mouth daily. Women's 50+   naproxen sodium (ANAPROX) 220 MG tablet Take 440 mg by mouth 2 (two) times daily as needed (pain). 2 tablets every night at bedtime   OneTouch Delica Lancets 60V MISC Use as instructed to check blood sugars 2 times per day dx: e11.65   OZEMPIC, 1 MG/DOSE, 2 MG/1.5ML SOPN INJECT INTO SKIN ONCE A WEEK (Patient taking differently: 1mg )   simvastatin (ZOCOR) 10 MG tablet TAKE 1 TABLET DAILY   SYNTHROID 25 MCG tablet Take 0.5 tablet (12.5 mcg) by mouth on Mondays through Fridays, then take 1 tablet (25 mcg) by mouth on Saturdays and Sundays.   No facility-administered encounter medications on file as of 01/04/2020.     Goals Addressed            This Visit's Progress    Collaborate with RN Care Manager to perform appropriate assessments to assist with care coordination needs   On track    Axis (see longitudinal plan of care for additional care plan information)  Current Barriers:   Awaiting follow up with therapist to establish care  Chronic conditions including DM II, CKD III, and HTN which put patient at increased risk for hospitalization  Social Work Clinical Goal(s):   Over the next 90 days the patient will work with SW to address ongoing care coordination needs  CCM SW Interventions: Completed 01/04/20  Inbound call received from the patient in response to SW voice message requesting a return call  Determined the patient has been in contact with [facility] and has an initial appointment scheduled for December 8th  Discussed the patient has recently joined a church that is spiritually fulfilling o The  patient attends a weekly meal on Wednesday nights and participates in a women's group o The patient attends Sunday school as well as church services weekly o The patient participates in "[facility]" on Tuesdays at her church where meals and toiletries are provided to the homeless  Discussed patient continued desire to find a job to stay busy during the day o The patient has recently completed a phone interview with [place] the patient has decided to end her relationship with her female friend at this time as she feels it is no longer beneficial to her  Encouraged the patient to continue participating and being involved with her church family  Scheduled follow up call to the patient over the next month to follow up on outcome of [facility] therapy appointment  Completed 12/28/19  Successful outbound call placed to the patient to assess goal progression  Determined the patient has yet to receive an appointment with a counselor through [facility]  Discussed difficulty locating [place] providers as well as scheduling delays due to demand  Advised patient SW would collaborate with Dr. Baird Cancer to inquire if other providers may be available  o Patient reports she was referred to one in the past and did not like them but may be open to other suggestions  Collaboration with Dr. Baird Cancer to discuss patient concern regarding appointment delays  Scheduled follow up call to the patient over the next week  Completed 12/16/19  Inbound call received from the patient to request assistance surrounding therapy referral o The patient reports she had not heard from [facility] and googled for a phone number to realize it is an addiction recovery center  Advised the patient that there is a treatment center named [facility] in [place] but there is also a [employer] account planner with psychiatrists and counselors called [facility]  Provided the patient with the contact number to [facility] on  [address]. [phone])   Advised the patient to contact [facility] to follow up on referrals for therapy  Received inbound call from the patient who reports she contacted [facility] who reported they had yet to receive a referral for the patient o Patient provided demographics as well as insurance information to verify benefits  Patient reports she was told she will most likely not receive an appointment until December or January  Scheduled follow up call to the patient over the next two weeks   Completed 12/06/19  Collaboration with RN Care Manager who requests SW outreach the patient after receiving a voice message regarding Location manager  Successful outbound call placed to the patient to assist with transportation resource needs  Determined the patient currently accesses [facility] and is interested in alternative transportation due to being late for an appointment due to [place] delays o The patient reports she received 2022 plan information in the mail outlining transportation benefit which includes 24 trips for the plan year o The patient reports plans  to contact her health plan to determine if she has transportation benefits for 2021 o Advised the patient to contact SW if needed to assist with determining eligibility of benefit  Advised the patient SW spoke with Dr. Baird Cancer who requested the patient wait approximately 2 weeks for [facility] to respond to referral   Scheduled follow up call over the next two weeks to assess goal progression  Completed 12/05/19  Inter-disciplinary care team collaboration (see longitudinal plan of care)  Inbound call received from the patient to request follow up on recent referral for therapy to [facility] placed by primary provider during last OV  Discussed the patient has been without a therapist for "a while" and is eager to begin therapeutic relationship  Advised the patient SW would collaborate with her PCP regarding  referral  Collaboration with Dr Baird Cancer who reports referral was placed approximately 1 week ago o Dr Baird Cancer reports patient should give the agency up to two weeks for outreach  Scheduled follow up call to the patient over the next week to determine if contact is received o SW will plan to assist with coordinating care as needed  Patient Self Care Activities:   Self administers medications as prescribed  Attends all scheduled provider appointments  Calls pharmacy for medication refills  Performs ADL's independently  Performs IADL's independently  Calls provider office for new concerns or questions  Please see past updates related to this goal by clicking on the "Past Updates" button in the selected goal          Follow Up Plan: SW will follow up with patient by phone over the next month.   Daneen Schick, BSW, CDP Social Worker, Certified Dementia Practitioner [place] [facility] [place] [facility] [phone]  Total time spent performing care coordination and/or care management activities with the patient by phone or face to face = 28 minutes.

## 2020-01-04 NOTE — Telephone Encounter (Signed)
°  Chronic Care Management   Outreach Note  01/04/2020 Name: Teresa Golden MRN: [id number] DOB: 1946-01-25  Referred by: Glendale Chard, MD Reason for referral : Care Coordination   An unsuccessful telephone outreach was attempted today. The patient was referred to the case management team for assistance with care management and care coordination.   Follow Up Plan: A HIPAA compliant phone message was left for the patient providing contact information and requesting a return call.  The care management team will reach out to the patient again over the next 28 days.   Daneen Schick, BSW, CDP Social Worker, Certified Dementia Practitioner [place] [facility] [place] [facility] [phone]

## 2020-01-04 NOTE — Patient Instructions (Signed)
Social Worker Visit Information  Goals we discussed today:  Goals Addressed            This Visit's Progress   . Collaborate with RN Care Manager to perform appropriate assessments to assist with care coordination needs   On track    [place] (see longitudinal plan of care for additional care plan information)  Current Barriers:  . Awaiting follow up with therapist to establish care . Chronic conditions including DM II, CKD III, and HTN which put patient at increased risk for hospitalization  Social Work Clinical Goal(s):  [name] Over the next 90 days the patient will work with SW to address ongoing care coordination needs  CCM SW Interventions: Completed 01/04/20 . Inbound call received from the patient in response to SW voice message requesting a return call . Determined the patient has been in contact with [facility] and has an initial appointment scheduled for December 8th . Discussed the patient has recently joined a church that is spiritually fulfilling o The patient attends a weekly meal on Wednesday nights and participates in a women's group o The patient attends Sunday school as well as church services weekly o The patient participates in "[facility]" on Tuesdays at her church where meals and toiletries are provided to the homeless . Discussed patient continued desire to find a job to stay busy during the day o The patient has recently completed a phone interview with [employer] . Determined the patient has decided to end her relationship with her female friend at this time as she feels it is no longer beneficial to her . Encouraged the patient to continue participating and being involved with her church family . Scheduled follow up call to the patient over the next month to follow up on outcome of [facility] therapy appointment  Completed 12/28/19 . Successful outbound call placed to the patient to assess goal progression . Determined the patient has yet to receive  an appointment with a counselor through [facility] . Discussed difficulty locating [facility] providers as well as scheduling delays due to demand . Advised patient SW would collaborate with Dr. Baird Cancer to inquire if other providers may be available  o Patient reports she was referred to one in the past and did not like them but may be open to other suggestions . Collaboration with Dr. Baird Cancer to discuss patient concern regarding appointment delays . Scheduled follow up call to the patient over the next week  Completed 12/16/19 . Inbound call received from the patient to request assistance surrounding therapy referral o The patient reports she had not heard from [facility] for a phone number to realize it is an addiction recovery center . Advised the patient that there is a treatment center named [facility] in [place] but there is also a Advertising account planner with psychiatrists and counselors called [facility] . Provided the patient with the contact number to [facility] on [address]. [phone])  . Advised the patient to contact [facility] to follow up on referrals for therapy . Received inbound call from the patient who reports she contacted [facility] who reported they had yet to receive a referral for the patient o Patient provided demographics as well as insurance information to verify benefits . Patient reports she was told she will most likely not receive an appointment until December or January . Scheduled follow up call to the patient over the next two weeks   Completed 12/06/19 . Collaboration with Consulting civil engineer who requests SW  outreach the patient after receiving a voice message regarding [facility] . Successful outbound call placed to the patient to assist with transportation resource needs . Determined the patient currently accesses [facility] and is interested in alternative transportation due to being late for an appointment due to [facility] delays o The  patient reports she received 2022 plan information in the mail outlining transportation benefit which includes 24 trips for the plan year o The patient reports plans to contact her health plan to determine if she has transportation benefits for 2021 o Advised the patient to contact SW if needed to assist with determining eligibility of benefit . Advised the patient SW spoke with Dr. Baird Cancer who requested the patient wait approximately 2 weeks for [facility] to respond to referral  . Scheduled follow up call over the next two weeks to assess goal progression  Completed 12/05/19 . Inter-disciplinary care team collaboration (see longitudinal plan of care) . Inbound call received from the patient to request follow up on recent referral for therapy to [facility] placed by primary provider during last OV . Discussed the patient has been without a therapist for "a while" and is eager to begin therapeutic relationship . Advised the patient SW would collaborate with her PCP regarding referral . Collaboration with Dr Baird Cancer who reports referral was placed approximately 1 week ago o Dr Baird Cancer reports patient should give the agency up to two weeks for outreach . Scheduled follow up call to the patient over the next week to determine if contact is received o SW will plan to assist with coordinating care as needed  Patient Self Care Activities:  . Self administers medications as prescribed . Attends all scheduled provider appointments . Calls pharmacy for medication refills . Performs ADL's independently . Performs IADL's independently . Calls provider office for new concerns or questions  Please see past updates related to this goal by clicking on the "Past Updates" button in the selected goal         Follow Up Plan: SW will follow up with patient by phone over the next month.   Daneen Schick, BSW, CDP Social Worker, Certified Dementia Practitioner [place] [facility] [place]  [facility] [phone]

## 2020-01-05 NOTE — Progress Notes (Signed)
   Subjective:     [name], female    DOB: Jan 10, 1946, 74 y.o.   MRN: [id number]  Chief Complaint  Patient presents with  . Follow-up    HPI: The patient is a 74 y.o. female here to further discuss panniculectomy.  She was seen by Dr. Marla Roe in consultation on 09/30/2019 for evaluation of her abdomen.  She underwent band surgery in [place] over 10 years ago, subsequently moved to [place] and had a gastric sleeve done by Dr. Hassell Done in 2018 and had the band removed.   Patient reports she has been using the nystatin cream within her abdominal fold and reports that has been minimally helpful. She continues to have intermittent rashes and foul odors within her folds. She also reports that she has been washing beneath the folds very frequently to help with the rashes, foul odors and itching/irritation. She reports that she has completed some physical therapy which provided some relief to her lower back pain, but has not been significantly helpful. She reports that she is still interested in pursuing panniculectomy as she is having trouble finding clothing that fit her comfortably. Also reports she has noticed continued itching within the folds of her abdomen. She continues to walk with the assistance of a walker.  Patient's last A1c was 11/29/2019 which was 6.7.  Review of Systems  Constitutional: Negative.   Musculoskeletal: Positive for back pain.  Skin: Positive for color change and rash. Negative for wound.     Objective:   Vital Signs BP (!) 149/69 (BP Location: Left Arm, Patient Position: Sitting, Cuff Size: Large)   Pulse 64   Temp 98 F (36.7 C) (Oral)   Ht 5\' 6"  (1.676 m)   Wt 216 lb (98 kg)   SpO2 99%   BMI 34.86 kg/m  Vital Signs and Nursing Note Reviewed Chaperone present Physical Exam Constitutional:      Appearance: She is obese.  HENT:     Head: Normocephalic and atraumatic.  Pulmonary:     Effort: Pulmonary effort is normal.  Abdominal:      General: Abdomen is flat. There is no distension.     Comments: Overhanging abdominal pannus noted without any erythema or cellulitic changes noted.  Neurological:     Mental Status: She is alert.  Psychiatric:        Mood and Affect: Mood normal.        Behavior: Behavior normal.     Assessment/Plan:  No diagnosis found.  Patient is a good candidate for panniculectomy of abdomen. Patient wishes to pursue surgical intervention for correction. She reports minimal improvement in her back pain after physical therapy, also reports she has been using the nystatin cream for the past 3 months since prescribed by her PCP with minimal relief. Her weight has been stable at 215 pounds since her last visit with Dr. Marla Roe on 09/30/2019.  Recommend panniculectomy, discussed with patient I would submit this to our surgical scheduling team for insurance approval and we would follow-up with her with additional information.   Pictures were obtained of the patient and placed in the chart with the patient's or guardian's permission. Recommend calling with questions or concerns.    Teresa Rhine Anaria Kroner, PA-C 01/06/2020, 9:41 AM

## 2020-01-06 ENCOUNTER — Ambulatory Visit (INDEPENDENT_AMBULATORY_CARE_PROVIDER_SITE_OTHER): Payer: Medicare HMO | Admitting: Surgical

## 2020-01-06 ENCOUNTER — Other Ambulatory Visit: Payer: Self-pay

## 2020-01-06 ENCOUNTER — Encounter: Payer: Self-pay | Admitting: Surgical

## 2020-01-06 VITALS — BP 149/69 | HR 64 | Temp 98.0°F | Ht 66.0 in | Wt 216.0 lb

## 2020-01-06 DIAGNOSIS — M793 Panniculitis, unspecified: Secondary | ICD-10-CM | POA: Diagnosis not present

## 2020-01-06 DIAGNOSIS — G8929 Other chronic pain: Secondary | ICD-10-CM | POA: Diagnosis not present

## 2020-01-06 DIAGNOSIS — M545 Low back pain, unspecified: Secondary | ICD-10-CM

## 2020-01-11 LAB — HM DIABETES EYE EXAM

## 2020-01-30 ENCOUNTER — Telehealth: Payer: Medicare HMO

## 2020-01-31 ENCOUNTER — Telehealth: Payer: Medicare HMO

## 2020-01-31 ENCOUNTER — Telehealth: Payer: Self-pay

## 2020-01-31 NOTE — Telephone Encounter (Cosign Needed)
  Chronic Care Management   Outreach Note  01/31/2020 Name: Teresa Golden MRN: [id number] DOB: May 31, 1945  Referred by: Glendale Chard, MD Reason for referral : Chronic Care Management (Inbound Call from patient )   Teresa Golden is enrolled in a Managed Medicaid Health Plan: No  An unsuccessful telephone outreach was attempted today. The patient was referred to the case management team for assistance with care management and care coordination.   Follow Up Plan: A HIPAA compliant phone message was left for the patient providing contact information and requesting a return call. Telephone follow up appointment with care management team member scheduled for: 02/10/20   Barb Merino, RN, BSN, CCM Care Management Coordinator [facility]  Direct Phone: [phone]

## 2020-02-01 ENCOUNTER — Encounter: Payer: Self-pay | Admitting: Addiction (Substance Use Disorder)

## 2020-02-01 ENCOUNTER — Telehealth: Payer: Medicare HMO

## 2020-02-01 ENCOUNTER — Ambulatory Visit: Payer: Self-pay

## 2020-02-01 ENCOUNTER — Other Ambulatory Visit: Payer: Self-pay

## 2020-02-01 ENCOUNTER — Ambulatory Visit (INDEPENDENT_AMBULATORY_CARE_PROVIDER_SITE_OTHER): Payer: Medicare HMO | Admitting: Addiction (Substance Use Disorder)

## 2020-02-01 DIAGNOSIS — E1122 Type 2 diabetes mellitus with diabetic chronic kidney disease: Secondary | ICD-10-CM

## 2020-02-01 DIAGNOSIS — N1831 Chronic kidney disease, stage 3a: Secondary | ICD-10-CM

## 2020-02-01 DIAGNOSIS — F4323 Adjustment disorder with mixed anxiety and depressed mood: Secondary | ICD-10-CM

## 2020-02-01 DIAGNOSIS — F419 Anxiety disorder, unspecified: Secondary | ICD-10-CM

## 2020-02-01 DIAGNOSIS — I129 Hypertensive chronic kidney disease with stage 1 through stage 4 chronic kidney disease, or unspecified chronic kidney disease: Secondary | ICD-10-CM

## 2020-02-01 DIAGNOSIS — Z6836 Body mass index (BMI) 36.0-36.9, adult: Secondary | ICD-10-CM

## 2020-02-01 DIAGNOSIS — N183 Chronic kidney disease, stage 3 unspecified: Secondary | ICD-10-CM

## 2020-02-01 NOTE — Progress Notes (Signed)
[facility] Counselor Initial Adult Exam  Name: Teresa Golden Date: 02/01/2020 MRN: [id number] DOB: 10-28-1945 PCP: Glendale Chard, MD  Time spent: 53min  Reason for Visit /Presenting Problem: Client came in needing a therapist and somewhere to process her thoughts and keep herself from being completely isolated. Client described her life experiences that brought her here and from [place] to [place]. Client discussed issues with her friends and current bf that arent working and are causing her harm: feeling like hes ashamed of her bec he wont take her to certain places bec of his long term relationship with another woman. Client processed other things that make her not feel valued and cause her to let those things happen. Client processed her childhood that was so traumatic due to her family history. She explained the  trauma of her mom's mental illness who lived in a state mental institution and the horror of watching her be so sick. Client reported never feeling adequate and never felt good enough as her dad would threaten she would be like her mom if she acted up as a kid. Client felt like her dad was ashamed of her at times, making sure to explain that most dads wouldn't raise their kids alone, causing her to never feel safe & loved and never trust anyone. Client processed the wounds of not trusting anyone and how God is pursuing her and healing her esp in her new found church she just joined. Client discussed her need for therapy, including building self esteem but also helping her leave the man who isnt treating her right. Therapist built rapport with client and client participated in the treatment planning of their therapy. Client agreed with the plan if there is a crisis: contact after hours office line, call 9-1-1 and/or crisis line given by therapist.   Mental Status Exam:   Appearance:   Well Groomed     Behavior:  Appropriate and Sharing  Motor:  Normal  Speech/Language:   Clear and Coherent and  Normal Rate  Affect:  Congruent and Full Range  Mood:  sad  Thought process:  normal  Thought content:    Rumination  Sensory/Perceptual disturbances:    Flashback  Orientation:  x4  Attention:  Good  Concentration:  Good  Memory:  WNL  Fund of knowledge:   Good  Insight:    Good  Judgment:   Good  Impulse Control:  Good   Reported Symptoms:  Shame, sadness.  Risk Assessment: Danger to Self:  No Self-injurious Behavior: No Danger to Others: No Duty to Warn:no Physical Aggression / Violence:No  Access to Firearms a concern: No  Gang Involvement:No  Patient / guardian was educated about steps to take if suicide or homicide risk level increases between visits: n/a While future psychiatric events cannot be accurately predicted, the patient does not currently require acute inpatient psychiatric care and does not currently meet [facility] involuntary commitment criteria.  Substance Abuse History: Current substance abuse: No     Past Psychiatric History:   Previous psychological history is significant for anxiety and trauma Outpatient Providers:Robyn Sanders  Abuse History: Victim of No.,  Victim of Neglect:No. Witness / Exposure to Domestic Violence: No   Protective Services Involvement: No   Family History:  Family History  Problem Relation Age of Onset  . Heart attack Mother   . Heart failure Mother   . Stroke Father    Living situation: the patient lives alone  Sexual Orientation:  Straight  Relationship Status: single  Name of spouse / other: n/a             If a parent, number of children / ages: daughter passed away at 86yo.  Support Systems; friends lives alone  Financial Stress:  Yes   Income/Employment/Disability: Journalist, newspaper History: Education: Scientist, product/process development:   [employer]  Recreation/Hobbies: church activities, jazz concerts  Strengths:  Supportive Relationships, Friends,  Financial controller, Spirituality, Hopefulness, Conservator, museum/gallery and Able to Administrator, Civil Service History: Pending legal issue / charges: The patient has no significant history of legal issues.  Medical History/Surgical History:reviewed Past Medical History:  Diagnosis Date  . Arthritis   . Chronic kidney disease    self reports ckd stage 3   . Diabetes mellitus, type II, insulin dependent ([place])    With neurologic complications. Bilateral lower extremity peripheral neuropathy  . Dyspnea    with excertion; no issues now since weight loss surgery   . Endometriosis   . Essential hypertension   . GERD (gastroesophageal reflux disease)   . 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 Hypertensive nephropathy 07/05/2018  . Hypothyroidism   . Hypothyroidism 02/06/2018  . Morbid obesity with BMI of 50.0-59.9, adult ([place])   . Scoliosis     Past Surgical History:  Procedure Laterality Date  . ABDOMINAL HYSTERECTOMY     uterus  . CHOLECYSTECTOMY    . EYE SURGERY     cataract extraction bilateral   . JOINT REPLACEMENT     Left knee  . KNEE ARTHROPLASTY Right 07/30/2017   Procedure: RIGHT TOTAL KNEE ARTHROPLASTY WITH COMPUTER NAVIGATION;  Surgeon: Rod Can, MD;  Location: [facility];  Service: Orthopedics;  Laterality: Right;  Needs RNFA  . LAPAROSCOPIC GASTRIC BANDING     and reversal  . LAPAROSCOPIC GASTRIC SLEEVE RESECTION N/A 08/26/2016   Procedure: LAPAROSCOPIC GASTRIC SLEEVE RESECTION WITH UPPER ENOD;  Surgeon: Johnathan Hausen, MD;  Location: [facility];  Service: General;  Laterality: N/A;  . TONSILLECTOMY    . TRANSTHORACIC ECHOCARDIOGRAM  11/2013   EF 65-70%. Normal diastolic Fxn.  Normal Valves.    Medications: Current Outpatient Medications  Medication Sig Dispense Refill  . amLODipine (NORVASC) 5 MG tablet Take 1 tablet (5 mg total) by mouth daily. 90 tablet 1  . B Complex-C (B-COMPLEX WITH VITAMIN C) tablet Take 1 tablet by  mouth daily.    . Biotin 10000 MCG TABS Take 1 tablet by mouth daily.    . carvedilol (COREG CR) 20 MG 24 hr capsule TAKE 1 CAPSULE DAILY 90 capsule 2  . Cholecalciferol 5000 units TABS Take 5,000 Units by mouth daily.    . Colchicine 0.6 MG CAPS TAKE 1 CAPSULE DAILY AS    NEEDED 90 capsule 1  . Cyanocobalamin (VITAMIN B-12) 2500 MCG SUBL Take 2,500 mcg by mouth daily.    . diclofenac Sodium (VOLTAREN) 1 % GEL APPLY 2 GRAMS TO AFFECTED AREA(S) 4 TIMES DAILY AS NEEDED 200 g 2  . docusate sodium (COLACE) 100 MG capsule Take 1 capsule (100 mg total) by mouth 2 (two) times daily. 60 capsule 1  . famotidine (PEPCID) 20 MG tablet Take 20 mg by mouth daily. PRN    . gabapentin (NEURONTIN) 300 MG capsule Take 300 mg by mouth at bedtime.     [name] glucose blood (ONETOUCH VERIO) test strip Use as instructed to check blood sugars 2 times per day dx: e11.65 300  each 2  . insulin degludec (TRESIBA FLEXTOUCH) 100 UNIT/ML SOPN FlexTouch Pen Inject 0.09 mLs (9 Units total) into the skin daily at 10 pm. (Patient taking differently: Inject 8 Units into the skin daily at 10 pm. ) 5 pen 2  . losartan (COZAAR) 100 MG tablet Take 1 tablet (100 mg total) by mouth daily. 90 tablet 2  . losartan (COZAAR) 50 MG tablet 2 tablets 2 times per day (Patient not taking: Reported on 01/06/2020) 180 tablet 1  . Multiple Vitamins-Minerals (MULTIVITAMIN WITH MINERALS) tablet Take 1 tablet by mouth daily. Women's 50+    . naproxen sodium (ANAPROX) 220 MG tablet Take 440 mg by mouth 2 (two) times daily as needed (pain). 2 tablets every night at bedtime    . nystatin (MYCOSTATIN/NYSTOP) powder Apply 1 application topically 2 (two) times daily. apply to affected area twice daily as needed 60 g 1  . OneTouch Delica Lancets 30S MISC Use as instructed to check blood sugars 2 times per day dx: e11.65 300 each 2  . OZEMPIC, 1 MG/DOSE, 2 MG/1.5ML SOPN INJECT INTO SKIN ONCE A WEEK (Patient taking differently: 1mg ) 9 pen 1  . simvastatin (ZOCOR) 10  MG tablet TAKE 1 TABLET DAILY 90 tablet 1  . SYNTHROID 25 MCG tablet Take 0.5 tablet (12.5 mcg) by mouth on Mondays through Fridays, then take 1 tablet (25 mcg) by mouth on Saturdays and Sundays. 90 tablet 1   No current facility-administered medications for this visit.    Diagnoses:    ICD-10-CM   1. Adjustment disorder with mixed anxiety and depressed mood  F43.23     Plan of Care:  Client is to return to therapy with therapist every 1-2 weeks as needed to process traumas/ frustrations in a safe space, to be re-evaluated in 3 months.  Client is to practice mindfulness AEB daily meditation and body scans or as needed when flooded by emotion/pain.  Client is to learn/practice DBT wise mind & radical acceptance. Client is to practice self-compassion AEB being gentle with themselves, utilizing self-care techniques daily or as needed when grieving something in the moment.  Client is to process grief/pain of their loss in a somatic body-felt sense way: ie using mindfulness, brainspotting, or trauma release as a method for releasing body pain/tension caused by grief.  Barnie Del, LCSW, LCAS, CCTP, CCS-I, BSP

## 2020-02-06 NOTE — Patient Instructions (Signed)
Visit Information     Other   .  Lifestyle Change-Hypertension        Timeframe:  Long-Range Goal Priority:  High Start Date: 02/01/20                            Expected End Date: 05/01/20                     Follow Up Date 02/08/20   - agree to work together to make changes - ask questions to understand - learn about high blood pressure    Why is this important?    The changes that you are asked to make may be hard to do.   This is especially true when the changes are life-long.   Knowing why it is important to you is the first step.   Working on the change with your family or support person helps you not feel alone.   Reward yourself and family or support person when goals are met. This can be an activity you choose like bowling, hiking, biking, swimming or shooting hoops.     Notes:     [name]  Manage My Emotions        Timeframe:  Long-Range Goal Priority:  High Start Date: 02/01/20                            Expected End Date:  05/01/20                     Follow Up Date 02/08/20    - begin personal counseling - check out volunteer opportunities - practice relaxation or meditation daily - talk about feelings with a friend, family or spiritual advisor - practice positive thinking and self-talk    Why is this important?    When you are stressed, down or upset, your body reacts too.   For example, your blood pressure may get higher; you may have a headache or stomachache.   When your emotions get the best of you, your body's ability to fight off cold and flu gets weak.   These steps will help you manage your emotions.     Notes:     [name]  Matintain My Quality of Life        Timeframe:  Long-Range Goal Priority:  High Start Date: 02/01/20                           Expected End Date: 05/01/20                    Follow Up Date 02/08/20   - discuss my treatment options with the doctor or nurse - learn something new by asking, reading and searching the Internet every  day - make shared treatment decisions with doctor    Why is this important?    Having a long-term illness can be scary.   It can also be stressful for you and your caregiver.   These steps may help.    Notes:     [name]  Monitor and Manage My Blood Sugar-Diabetes Type 2        Timeframe:  Long-Range Goal Priority:  High Start Date: 02/01/20  Expected End Date: 05/01/20                   Follow Up Date 02/10/20   - check blood sugar at prescribed times - check blood sugar before and after exercise - check blood sugar if I feel it is too high or too low - enter blood sugar readings and medication or insulin into daily log - take the blood sugar log to all doctor visits - take the blood sugar meter to all doctor visits    Why is this important?    Checking your blood sugar at home helps to keep it from getting very high or very low.   Writing the results in a diary or log helps the doctor know how to care for you.   Your blood sugar log should have the time, date and the results.   Also, write down the amount of insulin or other medicine that you take.   Other information, like what you ate, exercise done and how you were feeling, will also be helpful.     Notes:     .  Obtain Eye Exam-Diabetes Type 2        Timeframe:  Long-Range Goal Priority:  High Start Date: 02/01/20                            Expected End Date: 05/01/20    Follow Up Date 04/11/19   - keep appointment with eye doctor - schedule appointment with eye doctor    Why is this important?    Eye check-ups are important when you have diabetes.   Vision loss can be prevented.    Notes:     .  Perform Foot Care-Diabetes Type 2        Timeframe:  Long-Range Goal Priority:  High Start Date: 02/01/20                            Expected End Date: 05/01/20                     Follow Up Date 02/10/20   - check feet daily for cuts, sores or redness - do heel pump exercise 2 to 3 times  each day - keep feet up while sitting - trim toenails straight across - wash and dry feet carefully every day - wear comfortable, cotton socks - wear comfortable, well-fitting shoes    Why is this important?    Good foot care is very important when you have diabetes.   There are many things you can do to keep your feet healthy and catch a problem early.    Notes:     .  Set My Target A1C-Diabetes Type 2        Timeframe:  Long-Range Goal Priority:  High Start Date: 02/01/20                            Expected End Date: 05/01/20                      Follow Up Date 02/10/20   - set target A1C    Why is this important?    Your target A1C is decided together by you and your doctor.   It is based on several things like your age and other  health issues.    Notes:     .  Track and Manage My Blood Pressure-Hypertension        Timeframe:  Long-Range Goal Priority:  High Start Date: 02/01/20                            Expected End Date: 05/01/20                   Follow Up Date 02/08/20    - check blood pressure 3 times per week - write blood pressure results in a log or diary    Why is this important?    You won't feel high blood pressure, but it can still hurt your blood vessels.   High blood pressure can cause heart or kidney problems. It can also cause a stroke.   Making lifestyle changes like losing a [name] weight or eating less salt will help.   Checking your blood pressure at home and at different times of the day can help to control blood pressure.   If the doctor prescribes medicine remember to take it the way the doctor ordered.   Call the office if you cannot afford the medicine or if there are questions about it.     Notes:        The patient verbalized understanding of instructions, educational materials, and care plan provided today and declined offer to receive copy of patient instructions, educational materials, and care plan.   Telephone follow up  appointment with care management team member scheduled for: 02/10/20  Lynne Logan, RN

## 2020-02-06 NOTE — Chronic Care Management (AMB) (Signed)
Chronic Care Management   Follow Up Note   02/01/2020 Name: Teresa Golden MRN: [id number] DOB: [birth year]  Referred by: Glendale Chard, MD Reason for referral : Chronic Care Management (Inbound Call from patient)   Teresa Golden is a 74 y.o. year old female who is a primary care patient of Glendale Chard, MD. The CCM team was consulted for assistance with chronic disease management and care coordination needs.    Review of patient status, including review of consultants reports, relevant laboratory and other test results, and collaboration with appropriate care team members and the patient's provider was performed as part of comprehensive patient evaluation and provision of chronic care management services.    SDOH (Social Determinants of Health) assessments performed: Yes See Care Plan activities for detailed interventions related to [place])   Completed inbound CCM RN CM call with patient.     Outpatient Encounter Medications as of 02/01/2020  Medication Sig  . amLODipine (NORVASC) 5 MG tablet Take 1 tablet (5 mg total) by mouth daily.  . B Complex-C (B-COMPLEX WITH VITAMIN C) tablet Take 1 tablet by mouth daily.  . Biotin 10000 MCG TABS Take 1 tablet by mouth daily.  . carvedilol (COREG CR) 20 MG 24 hr capsule TAKE 1 CAPSULE DAILY  . Cholecalciferol 5000 units TABS Take 5,000 Units by mouth daily.  . Colchicine 0.6 MG CAPS TAKE 1 CAPSULE DAILY AS    NEEDED  . Cyanocobalamin (VITAMIN B-12) 2500 MCG SUBL Take 2,500 mcg by mouth daily.  . diclofenac Sodium (VOLTAREN) 1 % GEL APPLY 2 GRAMS TO AFFECTED AREA(S) 4 TIMES DAILY AS NEEDED  . docusate sodium (COLACE) 100 MG capsule Take 1 capsule (100 mg total) by mouth 2 (two) times daily.  . famotidine (PEPCID) 20 MG tablet Take 20 mg by mouth daily. PRN  . gabapentin (NEURONTIN) 300 MG capsule Take 300 mg by mouth at bedtime.   [name] glucose blood (ONETOUCH VERIO) test strip Use as instructed to check blood sugars 2 times per day dx: e11.65  .  insulin degludec (TRESIBA FLEXTOUCH) 100 UNIT/ML SOPN FlexTouch Pen Inject 0.09 mLs (9 Units total) into the skin daily at 10 pm. (Patient taking differently: Inject 8 Units into the skin daily at 10 pm. )  . losartan (COZAAR) 100 MG tablet Take 1 tablet (100 mg total) by mouth daily.  [name] losartan (COZAAR) 50 MG tablet 2 tablets 2 times per day (Patient not taking: Reported on 01/06/2020)  . Multiple Vitamins-Minerals (MULTIVITAMIN WITH MINERALS) tablet Take 1 tablet by mouth daily. Women's 50+  . naproxen sodium (ANAPROX) 220 MG tablet Take 440 mg by mouth 2 (two) times daily as needed (pain). 2 tablets every night at bedtime  . nystatin (MYCOSTATIN/NYSTOP) powder Apply 1 application topically 2 (two) times daily. apply to affected area twice daily as needed  . OneTouch Delica Lancets 16R MISC Use as instructed to check blood sugars 2 times per day dx: e11.65  . OZEMPIC, 1 MG/DOSE, 2 MG/1.5ML SOPN INJECT INTO SKIN ONCE A WEEK (Patient taking differently: 68m)  . simvastatin (ZOCOR) 10 MG tablet TAKE 1 TABLET DAILY  . SYNTHROID 25 MCG tablet Take 0.5 tablet (12.5 mcg) by mouth on Mondays through Fridays, then take 1 tablet (25 mcg) by mouth on Saturdays and Sundays.   No facility-administered encounter medications on file as of 02/01/2020.     Objective:  Lab Results  Component Value Date   HGBA1C 6.7 (H) 11/29/2019   HGBA1C 6.5 (H) 08/18/2019  HGBA1C 6.3 (H) 04/19/2019   Lab Results  Component Value Date   MICROALBUR 10 01/17/2019   LDLCALC 82 01/17/2019   CREATININE 0.96 11/29/2019   BP Readings from Last 3 Encounters:  01/06/20 (!) 149/69  11/29/19 130/68  11/29/19 130/68    Goals Addressed    Patient Care Plan: Diabetes Type 2 (Adult)    Problem Identified: Glycemic Management (Diabetes, Type 2)   Priority: High    Long-Range Goal: Glycemic Management Optimized   Start Date: 02/01/2020  Expected End Date: 05/01/2020  This Visit's Progress: On track  Priority: High  Note:    Objective:  Lab Results  Component Value Date   HGBA1C 6.7 (H) 11/29/2019 .   Lab Results  Component Value Date   CREATININE 0.96 11/29/2019   CREATININE 0.83 08/18/2019   CREATININE 1.09 (H) 04/19/2019 .   [name] No results found for: EGFR Current Barriers:  [name] Knowledge Deficits related to basic Diabetes pathophysiology and self care/management Case Manager Clinical Goal(s):  [name] Over the next 90 days, patient will demonstrate improved adherence to prescribed treatment plan for diabetes self care/management as evidenced by:  . daily monitoring and recording of CBG  . adherence to ADA/ carb modified diet . exercise 3-5 days/week . adherence to prescribed medication regimen Interventions:  . Reviewed medications with patient and discussed importance of medication adherence . Discussed plans with patient for ongoing care management follow up and provided patient with direct contact information for care management team . Advised patient, providing education and rationale, to check cbg daily before meals and record, calling the CCM team and or PCP for findings outside established parameters.   . Review of patient status, including review of consultants reports, relevant laboratory and other test results, and medications completed.  Anticipate A1C testing (point-of-care) every 3 to 6 months based on goal attainment.   Review mutually-set A1C goal or target range.   Anticipate use of antihyperglycemic with or without insulin and periodic adjustments; consider active involvement of pharmacist.   Compare self-reported symptoms of hypo or hyperglycemia to blood glucose levels, diet and fluid intake, current medications, psychosocial and physiologic stressors, change in activity and barriers to care adherence.  Patient Goals/Self-Care Activities . Over the next 90 days, patient will:  - Self administers oral medications as prescribed Self administers insulin as prescribed Self administers  injectable DM medication (Ozempic, [name]) as prescribed Attends all scheduled provider appointments Checks blood sugars as prescribed and utilize hyper and hypoglycemia protocol as needed Adheres to prescribed ADA/carb modified - Perform daily foot checks - Schedule eye and dental exams - enter blood sugar readings and medication or insulin into daily log - take the blood sugar log to all doctor visits - take the blood sugar meter to all doctor visits    Follow Up Plan: Telephone follow up appointment with care management team member scheduled for: 02/10/20   Problem Identified: Disease Progression (Diabetes, Type 2)   Priority: High    Long-Range Goal: Disease Progression Prevented or Minimized   Start Date: 02/01/2020  Expected End Date: 05/01/2020  This Visit's Progress: On track  Priority: High  Note:   Objective:  Lab Results  Component Value Date   HGBA1C 6.7 (H) 11/29/2019 .   Lab Results  Component Value Date   CREATININE 0.96 11/29/2019   CREATININE 0.83 08/18/2019   CREATININE 1.09 (H) 04/19/2019 .   [name] No results found for: EGFR Current Barriers:  [name] Knowledge Deficits related to basic Diabetes pathophysiology  and self care/management Case Manager Clinical Goal(s):  [name] Over the next 90 days, patient will demonstrate improved adherence to prescribed treatment plan for diabetes self care/management as evidenced by:  . daily monitoring and recording of CBG  . adherence to ADA/ carb modified diet . exercise 3-5 days/week . adherence to prescribed medication regimen Interventions:   Ensure completion of annual comprehensive foot exam and dilated eye exam.    Implement additional individualized goals and interventions based on identified risk factors.   Encourage lifestyle changes, such as increased intake of plant-based foods, stress reduction, consistent physical activity and smoking cessation to prevent long-term complications and chronic disease.    Individualize  activity and exercise recommendations while considering potential limitations, such as neuropathy, retinopathy or the ability to prevent hyperglycemia or hypoglycemia.   . Provided education to patient about basic DM disease process . Discussed plans with patient for ongoing care management follow up and provided patient with direct contact information for care management team . Advised patient, providing education and rationale, to check cbg 1-2 times daily before meals and record, calling the CCM team and or PCP for findings outside established parameters.   Patient Goals/Self-Care Activities . Over the next 90 days, patient will:  - Self administers oral medications as prescribed Self administers insulin as prescribed Self administers injectable DM medication (Ozempic, [name] ) as prescribed Attends all scheduled provider appointments Checks blood sugars as prescribed and utilize hyper and hypoglycemia protocol as needed Adheres to prescribed ADA/carb modified - set target A1C Follow Up Plan: Telephone follow up appointment with care management team member scheduled for: 02/10/20   Patient Care Plan: Hypertension (Adult)    Problem Identified: Hypertension (Hypertension)   Priority: High    Long-Range Goal: Hypertension Monitored   Start Date: 02/01/2020  Expected End Date: 05/01/2020  This Visit's Progress: On track  Priority: High  Note:   Objective:  . Last practice recorded BP readings:  BP Readings from Last 3 Encounters:  01/06/20 (!) 149/69  11/29/19 130/68  11/29/19 130/68 .   [name] Most recent eGFR/CrCl: No results found for: EGFR  No components found for: CRCL Current Barriers:  [name] Knowledge Deficits related to basic understanding of hypertension pathophysiology and self care management Case Manager Clinical Goal(s):  [name] Over the next 90 days, patient will verbalize understanding of plan for hypertension management Interventions:  . Evaluation of current treatment plan  related to hypertension self management and patient's adherence to plan as established by provider. . Provided education to patient re: stroke prevention, s/s of heart attack and stroke, DASH diet, complications of uncontrolled blood pressure . Reviewed medications with patient and discussed importance of compliance . Discussed plans with patient for ongoing care management follow up and provided patient with direct contact information for care management team . Advised patient, providing education and rationale, to monitor blood pressure daily and record, calling PCP for findings outside established parameters.  . Reviewed scheduled/upcoming provider appointments including: PCP OV 02/09/20 '@9' :45 am w/Dr. Baird [facility]   Encourage continued use of home blood pressure monitoring and recording in blood pressure log; include symptoms of hypotension or potential medication side effects in log.   Review blood pressure measurements taken inside and outside of the provider office; establish baseline and monitor trends; compare to target ranges or patient goal.  Patient Goals/Self-Care Activities . Over the next 90 days, patient will:  - Self administers medications as prescribed Attends all scheduled provider appointments Calls provider office for new concerns, questions,  or BP outside discussed parameters Checks BP and records as discussed Follows a low sodium diet/DASH diet - check blood pressure 3 times per week - write blood pressure results in a log or diary  Follow Up Plan: Telephone follow up appointment with care management team member scheduled for: 02/10/20   Problem Identified: Disease Progression (Hypertension)   Priority: High    Goal: Disease Progression Prevented or Minimized   Start Date: 02/01/2020  Expected End Date: 05/01/2020  This Visit's Progress: On track  Priority: High  Note:   Objective:  . Last practice recorded BP readings:  BP Readings from Last 3 Encounters:  01/06/20  (!) 149/69  11/29/19 130/68  11/29/19 130/68 .   [name] Most recent eGFR/CrCl: No results found for: EGFR  No components found for: CRCL Current Barriers:  [name] Knowledge Deficits related to basic understanding of hypertension pathophysiology and self care management Case Manager Clinical Goal(s):  [name] Over the next 90 days, patient will demonstrate improved adherence to prescribed treatment plan for hypertension as evidenced by taking all medications as prescribed, monitoring and recording blood pressure as directed, adhering to low sodium/DASH diet Interventions:  . Evaluation of current treatment plan related to hypertension self management and patient's adherence to plan as established by provider. . Provided education to patient re: stroke prevention, s/s of heart attack and stroke, DASH diet, complications of uncontrolled blood pressure . Reviewed medications with patient and discussed importance of compliance . Discussed plans with patient for ongoing care management follow up and provided patient with direct contact information for care management team . Advised patient, providing education and rationale, to monitor blood pressure daily and record, calling PCP for findings outside established parameters.   Promote a healthy diet that includes primarily plant-based foods, such as fruits, vegetables, whole grains, beans and legumes, low-fat dairy and lean meats.    Consider moderate reduction in sodium intake by avoiding the addition of salt to prepared foods and limiting processed meats, canned soup, frozen meals and salty snacks.    Promote a regular, daily exercise goal of 150 minutes per week of moderate exercise based on tolerance, ability and patient choice; consider referral to physical therapist, community wellness and/or activity program.   Review sources of stress; explore current coping strategies and encourage use of mindfulness, yoga, meditation or exercise to manage stress.   Patient  Goals/Self-Care Activities . Over the next 90 days, patient will:  - Self administers medications as prescribed Attends all scheduled provider appointments Calls provider office for new concerns, questions, or BP outside discussed parameters Checks BP and records as discussed Follows a low sodium diet/DASH diet - agree to work together to make changes - ask questions to understand - learn about high blood pressure Follow Up Plan: Telephone follow up appointment with care management team member scheduled for: 02/10/20   Problem Identified: Resistant Hypertension (Hypertension)   Priority: High    Patient Care Plan: General Plan of Care (Adult)    Problem Identified: Therapeutic Alliance (General Plan of Care)     Problem Identified: Health Promotion or Disease Self-Management (General Plan of Care)     Problem Identified: Coping Skills (General Plan of Care)     Long-Range Goal: Coping Skills Enhanced   Start Date: 02/01/2020  Expected End Date: 05/01/2020  This Visit's Progress: On track  Priority: High  Note:   Current Barriers:   Ineffective Self Health Maintenance  Currently UNABLE TO independently self manage needs related to chronic health conditions.  Knowledge Deficits related to short term plan for care coordination needs and long term plans for chronic disease management needs Nurse Case Manager Clinical Goal(s):   Over the next 90 days, patient will work with care management team to address care coordination and chronic disease management needs related to Disease Management  Educational Needs  Care Coordination  Medication Management and Education  Psychosocial Support   Interventions:   Answered patient's questions related to Acupuncture therapy and determined patient is considering this therapy   Promote a regular daily exercise program based on tolerance, ability and patient choice to support positive thinking about disease or aging.    Consider the use  of meditative movement therapy such as tai chi, yoga or qigong and massage therapy  Patient Goals/Self Care/Activities:  - begin personal counseling - check out volunteer opportunities - practice relaxation or meditation daily - talk about feelings with a friend, family or spiritual advisor - practice positive thinking and self-talk  Follow Up Plan: Telephone follow up appointment with care management team member scheduled for: 02/10/20   Problem Identified: Quality of Life (General Plan of Care)     Long-Range Goal: Quality of Life Maintained   Start Date: 02/01/2020  Expected End Date: 05/01/2020  This Visit's Progress: On track  Priority: High  Note:   Current Barriers:   Ineffective Self Health Maintenance  Currently UNABLE TO independently self manage needs related to chronic health conditions.   Knowledge Deficits related to short term plan for care coordination needs and long term plans for chronic disease management needs Nurse Case Manager Clinical Goal(s):   Over the next 90 days, patient will work with care management team to address care coordination and chronic disease management needs related to Disease Management  Educational Needs  Care Coordination  Medication Management and Education  Psychosocial Support   Interventions:   Assess patient's thoughts about quality of life, goals and expectations, and dissatisfaction or desire to improve.   Provide patient an opportunity to share by storytelling or a "life review" to give positive meaning to life and to assist with coping and negative experiences.   Assess and monitor for signs/symptoms of psychosocial concerns, especially depression or ideations regarding harm to others or self; provide or refer for mental health services as needed.   Promote activities to decrease social isolation such as group support or social, leisure and recreational activities, employment, use of social media; consider safety concerns  about being out of home for activities.  Patient Goals/Self Care/Activities:  - discuss my treatment options with the doctor or nurse - learn something new by asking, reading and searching the Internet every day - make shared treatment decisions with doctor  Follow Up Plan: Telephone follow up appointment with care management team member scheduled for: 02/10/20    Plan:   Telephone follow up appointment with care management team member scheduled for: 02/10/20  Barb Merino, RN, BSN, CCM Care Management Coordinator [facility]  Direct Phone: [phone]

## 2020-02-08 ENCOUNTER — Telehealth: Payer: Medicare HMO

## 2020-02-08 ENCOUNTER — Telehealth: Payer: Self-pay

## 2020-02-08 NOTE — Telephone Encounter (Signed)
  Chronic Care Management   Outreach Note  02/08/2020 Name: Teresa Golden MRN: [id number] DOB: [birth year]  Referred by: Glendale Chard, MD Reason for referral : [place] is enrolled in a Managed Medicaid Health Plan: No  An unsuccessful telephone outreach was attempted today. The patient was referred to the case management team for assistance with care management and care coordination.   Follow Up Plan: A HIPAA compliant phone message was left for the patient providing contact information and requesting a return call.  The care management team will reach out to the patient again over the next 30 days.   Daneen Schick, BSW, CDP Social Worker, Certified Dementia Practitioner [place] [facility] [phone]

## 2020-02-09 ENCOUNTER — Encounter: Payer: Self-pay | Admitting: Internal Medicine

## 2020-02-09 ENCOUNTER — Ambulatory Visit (INDEPENDENT_AMBULATORY_CARE_PROVIDER_SITE_OTHER): Payer: Medicare HMO | Admitting: Internal Medicine

## 2020-02-09 ENCOUNTER — Other Ambulatory Visit: Payer: Self-pay

## 2020-02-09 ENCOUNTER — Ambulatory Visit: Payer: Self-pay

## 2020-02-09 VITALS — BP 122/80 | HR 67 | Temp 98.2°F | Ht 66.0 in | Wt 212.0 lb

## 2020-02-09 DIAGNOSIS — I129 Hypertensive chronic kidney disease with stage 1 through stage 4 chronic kidney disease, or unspecified chronic kidney disease: Secondary | ICD-10-CM | POA: Diagnosis not present

## 2020-02-09 DIAGNOSIS — Z0000 Encounter for general adult medical examination without abnormal findings: Secondary | ICD-10-CM | POA: Diagnosis not present

## 2020-02-09 DIAGNOSIS — N183 Chronic kidney disease, stage 3 unspecified: Secondary | ICD-10-CM

## 2020-02-09 DIAGNOSIS — N1831 Chronic kidney disease, stage 3a: Secondary | ICD-10-CM | POA: Diagnosis not present

## 2020-02-09 DIAGNOSIS — E1122 Type 2 diabetes mellitus with diabetic chronic kidney disease: Secondary | ICD-10-CM | POA: Diagnosis not present

## 2020-02-09 DIAGNOSIS — E559 Vitamin D deficiency, unspecified: Secondary | ICD-10-CM

## 2020-02-09 DIAGNOSIS — I4510 Unspecified right bundle-branch block: Secondary | ICD-10-CM

## 2020-02-09 DIAGNOSIS — R202 Paresthesia of skin: Secondary | ICD-10-CM

## 2020-02-09 DIAGNOSIS — M79609 Pain in unspecified limb: Secondary | ICD-10-CM

## 2020-02-09 LAB — POCT URINALYSIS DIPSTICK
Bilirubin, UA: NEGATIVE
Blood, UA: NEGATIVE
Glucose, UA: NEGATIVE
Leukocytes, UA: NEGATIVE
Nitrite, UA: POSITIVE
Protein, UA: NEGATIVE
Spec Grav, UA: 1.015 (ref 1.010–1.025)
Urobilinogen, UA: 0.2 E.U./dL
pH, UA: 6.5 (ref 5.0–8.0)

## 2020-02-09 LAB — POCT UA - MICROALBUMIN
Albumin/Creatinine Ratio, Urine, POC: 30
Creatinine, POC: 300 mg/dL
Microalbumin Ur, POC: 10 mg/L

## 2020-02-09 NOTE — Patient Instructions (Signed)
Diabetes Mellitus and Exercise Exercising regularly is important for your overall health, especially when you have diabetes (diabetes mellitus). Exercising is not only about losing weight. It has many other health benefits, such as increasing muscle strength and bone density and reducing body fat and stress. This leads to improved fitness, flexibility, and endurance, all of which result in better overall health. Exercise has additional benefits for people with diabetes, including:  Reducing appetite.  Helping to lower and control blood glucose.  Lowering blood pressure.  Helping to control amounts of fatty substances (lipids) in the blood, such as cholesterol and triglycerides.  Helping the body to respond better to insulin (improving insulin sensitivity).  Reducing how much insulin the body needs.  Decreasing the risk for heart disease by: ? Lowering cholesterol and triglyceride levels. ? Increasing the levels of good cholesterol. ? Lowering blood glucose levels. What is my activity plan? Your health care provider or certified diabetes educator can help you make a plan for the type and frequency of exercise (activity plan) that works for you. Make sure that you:  Do at least 150 minutes of moderate-intensity or vigorous-intensity exercise each week. This could be brisk walking, biking, or water aerobics. ? Do stretching and strength exercises, such as yoga or weightlifting, at least 2 times a week. ? Spread out your activity over at least 3 days of the week.  Get some form of physical activity every day. ? Do not go more than 2 days in a row without some kind of physical activity. ? Avoid being inactive for more than 30 minutes at a time. Take frequent breaks to walk or stretch.  Choose a type of exercise or activity that you enjoy, and set realistic goals.  Start slowly, and gradually increase the intensity of your exercise over time. What do I need to know about managing my  diabetes?   Check your blood glucose before and after exercising. ? If your blood glucose is 240 mg/dL (13.3 mmol/L) or higher before you exercise, check your urine for ketones. If you have ketones in your urine, do not exercise until your blood glucose returns to normal. ? If your blood glucose is 100 mg/dL (5.6 mmol/L) or lower, eat a snack containing 15-20 grams of carbohydrate. Check your blood glucose 15 minutes after the snack to make sure that your level is above 100 mg/dL (5.6 mmol/L) before you start your exercise.  Know the symptoms of low blood glucose (hypoglycemia) and how to treat it. Your risk for hypoglycemia increases during and after exercise. Common symptoms of hypoglycemia can include: ? Hunger. ? Anxiety. ? Sweating and feeling clammy. ? Confusion. ? Dizziness or feeling light-headed. ? Increased heart rate or palpitations. ? Blurry vision. ? Tingling or numbness around the mouth, lips, or tongue. ? Tremors or shakes. ? Irritability.  Keep a rapid-acting carbohydrate snack available before, during, and after exercise to help prevent or treat hypoglycemia.  Avoid injecting insulin into areas of the body that are going to be exercised. For example, avoid injecting insulin into: ? The arms, when playing tennis. ? The legs, when jogging.  Keep records of your exercise habits. Doing this can help you and your health care provider adjust your diabetes management plan as needed. Write down: ? Food that you eat before and after you exercise. ? Blood glucose levels before and after you exercise. ? The type and amount of exercise you have done. ? When your insulin is expected to peak, if you use   insulin. Avoid exercising at times when your insulin is peaking.  When you start a new exercise or activity, work with your health care provider to make sure the activity is safe for you, and to adjust your insulin, medicines, or food intake as needed.  Drink plenty of water while  you exercise to prevent dehydration or heat stroke. Drink enough fluid to keep your urine clear or pale yellow. Summary  Exercising regularly is important for your overall health, especially when you have diabetes (diabetes mellitus).  Exercising has many health benefits, such as increasing muscle strength and bone density and reducing body fat and stress.  Your health care provider or certified diabetes educator can help you make a plan for the type and frequency of exercise (activity plan) that works for you.  When you start a new exercise or activity, work with your health care provider to make sure the activity is safe for you, and to adjust your insulin, medicines, or food intake as needed. This information is not intended to replace advice given to you by your health care provider. Make sure you discuss any questions you have with your health care provider. Document Revised: 09/04/2016 Document Reviewed: 07/23/2015 Elsevier Patient Education  [place] Maintenance, Female Adopting a healthy lifestyle and getting preventive care are important in promoting health and wellness. Ask your health care provider about:  The right schedule for you to have regular tests and exams.  Things you can do on your own to prevent diseases and keep yourself healthy. What should I know about diet, weight, and exercise? Eat a healthy diet   Eat a diet that includes plenty of vegetables, fruits, low-fat dairy products, and lean protein.  Do not eat a lot of foods that are high in solid fats, added sugars, or sodium. Maintain a healthy weight Body mass index (BMI) is used to identify weight problems. It estimates body fat based on height and weight. Your health care provider can help determine your BMI and help you achieve or maintain a healthy weight. Get regular exercise Get regular exercise. This is one of the most important things you can do for your health. Most adults  should:  Exercise for at least 150 minutes each week. The exercise should increase your heart rate and make you sweat (moderate-intensity exercise).  Do strengthening exercises at least twice a week. This is in addition to the moderate-intensity exercise.  Spend less time sitting. Even light physical activity can be beneficial. Watch cholesterol and blood lipids Have your blood tested for lipids and cholesterol at 75 years of age, then have this test every 5 years. Have your cholesterol levels checked more often if:  Your lipid or cholesterol levels are high.  You are older than 75 years of age.  You are at high risk for heart disease. What should I know about cancer screening? Depending on your health history and family history, you may need to have cancer screening at various ages. This may include screening for:  Breast cancer.  Cervical cancer.  Colorectal cancer.  Skin cancer.  Lung cancer. What should I know about heart disease, diabetes, and high blood pressure? Blood pressure and heart disease  High blood pressure causes heart disease and increases the risk of stroke. This is more likely to develop in people who have high blood pressure readings, are of African descent, or are overweight.  Have your blood pressure checked: ? Every 3-5 years if you are 63-73 years of age. ?  Every year if you are 46 years old or older. Diabetes Have regular diabetes screenings. This checks your fasting blood sugar level. Have the screening done:  Once every three years after age 21 if you are at a normal weight and have a low risk for diabetes.  More often and at a younger age if you are overweight or have a high risk for diabetes. What should I know about preventing infection? Hepatitis B If you have a higher risk for hepatitis B, you should be screened for this virus. Talk with your health care provider to find out if you are at risk for hepatitis B infection. Hepatitis C Testing  is recommended for:  Everyone born from [birth year] through 1966.  Anyone with known risk factors for hepatitis C. Sexually transmitted infections (STIs)  Get screened for STIs, including gonorrhea and chlamydia, if: ? You are sexually active and are younger than 75 years of age. ? You are older than 75 years of age and your health care provider tells you that you are at risk for this type of infection. ? Your sexual activity has changed since you were last screened, and you are at increased risk for chlamydia or gonorrhea. Ask your health care provider if you are at risk.  Ask your health care provider about whether you are at high risk for HIV. Your health care provider may recommend a prescription medicine to help prevent HIV infection. If you choose to take medicine to prevent HIV, you should first get tested for HIV. You should then be tested every 3 months for as long as you are taking the medicine. Pregnancy  If you are about to stop having your period (premenopausal) and you may become pregnant, seek counseling before you get pregnant.  Take 400 to 800 micrograms (mcg) of folic acid every day if you become pregnant.  Ask for birth control (contraception) if you want to prevent pregnancy. Osteoporosis and menopause Osteoporosis is a disease in which the bones lose minerals and strength with aging. This can result in bone fractures. If you are 74 years old or older, or if you are at risk for osteoporosis and fractures, ask your health care provider if you should:  Be screened for bone loss.  Take a calcium or vitamin D supplement to lower your risk of fractures.  Be given hormone replacement therapy (HRT) to treat symptoms of menopause. Follow these instructions at home: Lifestyle  Do not use any products that contain nicotine or tobacco, such as cigarettes, e-cigarettes, and chewing tobacco. If you need help quitting, ask your health care provider.  Do not use street drugs.  Do not  share needles.  Ask your health care provider for help if you need support or information about quitting drugs. Alcohol use  Do not drink alcohol if: ? Your health care provider tells you not to drink. ? You are pregnant, may be pregnant, or are planning to become pregnant.  If you drink alcohol: ? Limit how much you use to 0-1 drink a day. ? Limit intake if you are breastfeeding.  Be aware of how much alcohol is in your drink. In the [place] one drink equals one 12 oz bottle of beer (355 mL), one 5 oz glass of wine (148 mL), or one 1 oz glass of hard liquor (44 mL). General instructions  Schedule regular health, dental, and eye exams.  Stay current with your vaccines.  Tell your health care provider if: ? You often feel depressed. ?  You have ever been abused or do not feel safe at home. Summary  Adopting a healthy lifestyle and getting preventive care are important in promoting health and wellness.  Follow your health care provider's instructions about healthy diet, exercising, and getting tested or screened for diseases.  Follow your health care provider's instructions on monitoring your cholesterol and blood pressure. This information is not intended to replace advice given to you by your health care provider. Make sure you discuss any questions you have with your health care provider. Document Revised: 02/03/2018 Document Reviewed: 02/03/2018 Elsevier Patient Education  2020 [employer].

## 2020-02-09 NOTE — Progress Notes (Signed)
I,Katawbba Wiggins,acting as a Education administrator for Maximino Greenland, MD.,have documented all relevant documentation on the behalf of Maximino Greenland, MD,as directed by  Maximino Greenland, MD while in the presence of Maximino Greenland, MD.  This visit occurred during the SARS-CoV-2 public health emergency.  Safety protocols were in place, including screening questions prior to the visit, additional usage of staff PPE, and extensive cleaning of exam room while observing appropriate contact time as indicated for disinfecting solutions.  Subjective:     [name] , female    DOB: 1945-04-16 , 74 y.o.   MRN: [id number]   Chief Complaint  Patient presents with  . Annual Exam  . Diabetes  . Hypertension    HPI  She is here today for a full physical examination.  She is no longer followed by GYN. She reports that she does not care for her. She does not wish to have a GYN referral at this time.   Diabetes She presents for her follow-up diabetic visit. She has type 2 diabetes mellitus. There are no hypoglycemic associated symptoms. Pertinent negatives for hypoglycemia include no dizziness or headaches. Pertinent negatives for diabetes include no blurred vision, no chest pain, no fatigue, no polydipsia, no polyphagia and no polyuria. There are no hypoglycemic complications. Risk factors for coronary artery disease include diabetes mellitus, dyslipidemia, hypertension, obesity, sedentary lifestyle and post-menopausal. She is following a diabetic diet. She participates in exercise intermittently. Her home blood glucose trend is fluctuating minimally. Her breakfast blood glucose is taken between 8-9 am. Her breakfast blood glucose range is generally 110-130 mg/dl. An ACE inhibitor/angiotensin II receptor blocker is being taken. Eye exam is current.  Hypertension This is a chronic problem. The current episode started more than 1 year ago. The problem has been gradually improving since onset. The problem is  uncontrolled. Pertinent negatives include no blurred vision, chest pain or headaches. Risk factors for coronary artery disease include diabetes mellitus, dyslipidemia, post-menopausal state and sedentary lifestyle. Past treatments include angiotensin blockers and beta blockers. The current treatment provides moderate improvement.     Past Medical History:  Diagnosis Date  . Arthritis   . Chronic kidney disease    self reports ckd stage 3   . Diabetes mellitus, type II, insulin dependent ([place])    With neurologic complications. Bilateral lower extremity peripheral neuropathy  . Dyspnea    with excertion; no issues now since weight loss surgery   . Endometriosis   . Essential hypertension   . GERD (gastroesophageal reflux disease)   . 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 Hypertensive nephropathy 07/05/2018  . Hypothyroidism   . Hypothyroidism 02/06/2018  . Morbid obesity with BMI of 50.0-59.9, adult ([place])   . Scoliosis      Family History  Problem Relation Age of Onset  . Heart attack Mother   . Heart failure Mother   . Stroke Father      Current Outpatient Medications:  .  amLODipine (NORVASC) 5 MG tablet, Take 1 tablet (5 mg total) by mouth daily., Disp: 90 tablet, Rfl: 1 .  B Complex-C (B-COMPLEX WITH VITAMIN C) tablet, Take 1 tablet by mouth daily., Disp: , Rfl:  .  Biotin 10000 MCG TABS, Take 1 tablet by mouth daily., Disp: , Rfl:  .  Cholecalciferol 5000 units TABS, Take 5,000 Units by mouth daily., Disp: , Rfl:  .  Colchicine 0.6 MG CAPS, TAKE  1 CAPSULE DAILY AS    NEEDED, Disp: 90 capsule, Rfl: 1 .  Cyanocobalamin (VITAMIN B-12) 2500 MCG SUBL, Take 2,500 mcg by mouth daily., Disp: , Rfl:  .  diclofenac Sodium (VOLTAREN) 1 % GEL, APPLY 2 GRAMS TO AFFECTED AREA(S) 4 TIMES DAILY AS NEEDED, Disp: 200 g, Rfl: 2 .  famotidine (PEPCID) 20 MG tablet, Take 20 mg by mouth daily. PRN, Disp: , Rfl:  .  gabapentin (NEURONTIN) 300 MG capsule,  Take 600 mg by mouth at bedtime. Pt taking 600 MG at bedtime, Disp: , Rfl:  .  glucose blood (ONETOUCH VERIO) test strip, Use as instructed to check blood sugars 2 times per day dx: e11.65, Disp: 300 each, Rfl: 2 .  insulin degludec (TRESIBA FLEXTOUCH) 100 UNIT/ML SOPN FlexTouch Pen, Inject 0.09 mLs (9 Units total) into the skin daily at 10 pm. (Patient taking differently: Inject 8 Units into the skin daily at 10 pm.), Disp: 5 pen, Rfl: 2 .  losartan (COZAAR) 100 MG tablet, Take 1 tablet (100 mg total) by mouth daily., Disp: 90 tablet, Rfl: 2 .  Multiple Vitamins-Minerals (MULTIVITAMIN WITH MINERALS) tablet, Take 1 tablet by mouth daily. Women's 50+, Disp: , Rfl:  .  naproxen sodium (ANAPROX) 220 MG tablet, Take 440 mg by mouth 2 (two) times daily as needed (pain). 2 tablets every night at bedtime, Disp: , Rfl:  .  OneTouch Delica Lancets 83T MISC, Use as instructed to check blood sugars 2 times per day dx: e11.65, Disp: 300 each, Rfl: 2 .  OZEMPIC, 1 MG/DOSE, 2 MG/1.5ML SOPN, INJECT INTO SKIN ONCE A WEEK (Patient taking differently: 1mg ), Disp: 9 pen, Rfl: 1 .  simvastatin (ZOCOR) 10 MG tablet, TAKE 1 TABLET DAILY, Disp: 90 tablet, Rfl: 1 .  SYNTHROID 25 MCG tablet, Take 0.5 tablet (12.5 mcg) by mouth on Mondays through Fridays, then take 1 tablet (25 mcg) by mouth on Saturdays and Sundays., Disp: 90 tablet, Rfl: 1 .  carvedilol (COREG CR) 20 MG 24 hr capsule, TAKE 1 CAPSULE DAILY, Disp: 90 capsule, Rfl: 2 .  docusate sodium (COLACE) 100 MG capsule, Take 1 capsule (100 mg total) by mouth 2 (two) times daily. (Patient not taking: Reported on 02/09/2020), Disp: 60 capsule, Rfl: 1   Allergies  Allergen Reactions  . Meloxicam Other (See Comments)    Fever; muscle aches; "flu-like" symptoms  . Other     Rose fever and hay fever       The patient states she uses status post hysterectomy for birth control. Last LMP was No LMP recorded. Patient has had a hysterectomy.. Negative for Dysmenorrhea.  Negative for: breast discharge, breast lump(s), breast pain and breast self exam. Associated symptoms include abnormal vaginal bleeding. Pertinent negatives include abnormal bleeding (hematology), anxiety, decreased libido, depression, difficulty falling sleep, dyspareunia, history of infertility, nocturia, sexual dysfunction, sleep disturbances, urinary incontinence, urinary urgency, vaginal discharge and vaginal itching. Diet regular.The patient states her exercise level is  intermittent.  . The patient's tobacco use is:  Social History   Tobacco Use  Smoking Status Former Smoker  . Packs/day: 0.25  . Years: 10.00  . Pack years: 2.50  . Types: Cigarettes  Smokeless Tobacco Never Used  Tobacco Comment   quit 20 years ago  . She has been exposed to passive smoke. The patient's alcohol use is:  Social History   Substance and Sexual Activity  Alcohol Use Not Currently  . Alcohol/week: 1.0 standard drink  . Types: 1 Glasses of wine per  week   Comment: occasional     Review of Systems  Constitutional: Negative.  Negative for fatigue.  HENT: Negative.   Eyes: Negative.  Negative for blurred vision.  Respiratory: Negative.   Cardiovascular: Negative.  Negative for chest pain.  Gastrointestinal: Negative.   Endocrine: Negative.  Negative for polydipsia, polyphagia and polyuria.  Genitourinary: Negative.   Musculoskeletal: Negative.   Skin: Negative.   Allergic/Immunologic: Negative.   Neurological: Positive for numbness. Negative for dizziness and headaches.       C/o numbness and pain of RLE  Psychiatric/Behavioral: Negative.      Today's Vitals   02/09/20 0955  BP: 122/80  Pulse: 67  Temp: 98.2 F (36.8 C)  TempSrc: Oral  Weight: 212 lb (96.2 kg)  Height: 5\' 6"  (1.676 m)   Body mass index is 34.22 kg/m.   Objective:  Physical Exam Vitals and nursing note reviewed.  Constitutional:      Appearance: Normal appearance. She is obese.  HENT:     Head: Normocephalic  and atraumatic.     Right Ear: Tympanic membrane, ear canal and external ear normal.     Left Ear: Tympanic membrane, ear canal and external ear normal.     Nose:     Comments: Deferred, masked    Mouth/Throat:     Comments: Deferred, masked Eyes:     Extraocular Movements: Extraocular movements intact.     Conjunctiva/sclera: Conjunctivae normal.     Pupils: Pupils are equal, round, and reactive to light.  Cardiovascular:     Rate and Rhythm: Normal rate and regular rhythm.     Pulses: Normal pulses.          Dorsalis pedis pulses are 2+ on the right side and 2+ on the left side.     Heart sounds: Normal heart sounds.  Pulmonary:     Effort: Pulmonary effort is normal.     Breath sounds: Normal breath sounds.  Chest:  Breasts:     Tanner Score is 5.     Right: Normal.     Left: Normal.    Abdominal:     General: Bowel sounds are normal.     Palpations: Abdomen is soft.  Genitourinary:    Comments: deferred Musculoskeletal:        General: Normal range of motion.     Cervical back: Normal range of motion and neck supple.  Feet:     Right foot:     Protective Sensation: 5 sites tested. 5 sites sensed.     Skin integrity: Dry skin present.     Toenail Condition: Right toenails are abnormally thick.     Left foot:     Protective Sensation: 5 sites tested. 5 sites sensed.     Skin integrity: Dry skin present.     Toenail Condition: Left toenails are abnormally thick.  Skin:    General: Skin is warm and dry.  Neurological:     General: No focal deficit present.     Mental Status: She is alert and oriented to person, place, and time.  Psychiatric:        Mood and Affect: Mood normal.        Behavior: Behavior normal.         Assessment And Plan:     1. Routine general medical examination at health care facility Comments: A full exam was performed. Importance of monthly self breast exams was discussed with the patient. PATIENT IS ADVISED TO GET 30-45 MINUTES  REGULAR  EXERCISE NO LESS THAN FOUR TO FIVE DAYS PER WEEK - BOTH WEIGHTBEARING EXERCISES AND AEROBIC ARE RECOMMENDED.  PATIENT IS ADVISED TO FOLLOW A HEALTHY DIET WITH AT LEAST SIX FRUITS/VEGGIES PER DAY, DECREASE INTAKE OF RED MEAT, AND TO INCREASE FISH INTAKE TO TWO DAYS PER WEEK.  MEATS/FISH SHOULD NOT BE FRIED, BAKED OR BROILED IS PREFERABLE.  I SUGGEST WEARING SPF 50 SUNSCREEN ON EXPOSED PARTS AND ESPECIALLY WHEN IN THE DIRECT SUNLIGHT FOR AN EXTENDED PERIOD OF TIME.  PLEASE AVOID FAST FOOD RESTAURANTS AND INCREASE YOUR WATER INTAKE.  2. Type 2 diabetes mellitus with stage 3a chronic kidney disease, without long-term current use of insulin (HCC) Comments: Diabetic foot exam was performed.  I DISCUSSED WITH THE PATIENT AT LENGTH REGARDING THE GOALS OF GLYCEMIC CONTROL AND POSSIBLE LONG-TERM COMPLICATIONS.  I  ALSO STRESSED THE IMPORTANCE OF COMPLIANCE WITH HOME GLUCOSE MONITORING, DIETARY RESTRICTIONS INCLUDING AVOIDANCE OF SUGARY DRINKS/PROCESSED FOODS,  ALONG WITH REGULAR EXERCISE.  I  ALSO STRESSED THE IMPORTANCE OF ANNUAL EYE EXAMS, SELF FOOT CARE AND COMPLIANCE WITH OFFICE VISITS. - CBC - Lipid panel - POCT Urinalysis Dipstick (81002) - POCT UA - Microalbumin  3. Hypertensive nephropathy Comments: Chronic, well controlled. She will continue with current meds. EKg performed, NSR w/ RBBB. She is encouraged to avoid adding salt to her foods.  - EKG 12-Lead  4. RBBB Comments: New EKG finding. She also reports new SOB, will refer her to Cardiology for further evaluation. She agrees to referral.   5. Paresthesia and pain of right extremity Comments: She agrees to referral for NCS.  - Nerve conduction test; Future  6. Vitamin D deficiency disease Comments: I will check vitamin D level and supplement as needed.  - VITAMIN D 25 Hydroxy (Vit-D Deficiency, Fractures)  Patient was given opportunity to ask questions. Patient verbalized understanding of the plan and was able to repeat key elements of the  plan. All questions were answered to their satisfaction.  Maximino Greenland, MD   I, Maximino Greenland, MD, have reviewed all documentation for this visit. The documentation on 02/25/20 for the exam, diagnosis, procedures, and orders are all accurate and complete.  THE PATIENT IS ENCOURAGED TO PRACTICE SOCIAL DISTANCING DUE TO THE COVID-19 PANDEMIC.

## 2020-02-09 NOTE — Chronic Care Management (AMB) (Signed)
Chronic Care Management    Social Work Follow Up Note  02/09/2020 Name: Teresa Golden MRN: [id number] DOB: [birth year]  Teresa Golden is a 74 y.o. year old female who is a primary care patient of Glendale Chard, MD. The CCM team was consulted for assistance with care coordination.   Review of patient status, including review of consultants reports, other relevant assessments, and collaboration with appropriate care team members and the patient's provider was performed as part of comprehensive patient evaluation and provision of chronic care management services.    SDOH (Social Determinants of Health) assessments performed: No    Outpatient Encounter Medications as of 02/09/2020  Medication Sig  . amLODipine (NORVASC) 5 MG tablet Take 1 tablet (5 mg total) by mouth daily.  . B Complex-C (B-COMPLEX WITH VITAMIN C) tablet Take 1 tablet by mouth daily.  . Biotin 10000 MCG TABS Take 1 tablet by mouth daily.  . carvedilol (COREG CR) 20 MG 24 hr capsule TAKE 1 CAPSULE DAILY  . Cholecalciferol 5000 units TABS Take 5,000 Units by mouth daily.  . Colchicine 0.6 MG CAPS TAKE 1 CAPSULE DAILY AS    NEEDED  . Cyanocobalamin (VITAMIN B-12) 2500 MCG SUBL Take 2,500 mcg by mouth daily.  . diclofenac Sodium (VOLTAREN) 1 % GEL APPLY 2 GRAMS TO AFFECTED AREA(S) 4 TIMES DAILY AS NEEDED  . docusate sodium (COLACE) 100 MG capsule Take 1 capsule (100 mg total) by mouth 2 (two) times daily. (Patient not taking: Reported on 02/09/2020)  . famotidine (PEPCID) 20 MG tablet Take 20 mg by mouth daily. PRN  . gabapentin (NEURONTIN) 300 MG capsule Take 600 mg by mouth at bedtime. Pt taking 600 MG at bedtime  . glucose blood (ONETOUCH VERIO) test strip Use as instructed to check blood sugars 2 times per day dx: e11.65  . insulin degludec (TRESIBA FLEXTOUCH) 100 UNIT/ML SOPN FlexTouch Pen Inject 0.09 mLs (9 Units total) into the skin daily at 10 pm. (Patient taking differently: Inject 8 Units into the skin daily at 10 pm.)   . losartan (COZAAR) 100 MG tablet Take 1 tablet (100 mg total) by mouth daily.  . Multiple Vitamins-Minerals (MULTIVITAMIN WITH MINERALS) tablet Take 1 tablet by mouth daily. Women's 50+  . naproxen sodium (ANAPROX) 220 MG tablet Take 440 mg by mouth 2 (two) times daily as needed (pain). 2 tablets every night at bedtime  . OneTouch Delica Lancets 24Q MISC Use as instructed to check blood sugars 2 times per day dx: e11.65  . OZEMPIC, 1 MG/DOSE, 2 MG/1.5ML SOPN INJECT INTO SKIN ONCE A WEEK (Patient taking differently: 68m)  . simvastatin (ZOCOR) 10 MG tablet TAKE 1 TABLET DAILY  . SYNTHROID 25 MCG tablet Take 0.5 tablet (12.5 mcg) by mouth on Mondays through Fridays, then take 1 tablet (25 mcg) by mouth on Saturdays and Sundays.   No facility-administered encounter medications on file as of 02/09/2020.     Patient Care Plan: Diabetes Type 2 (Adult)    Problem Identified: Glycemic Management (Diabetes, Type 2)   Priority: High    Long-Range Goal: Glycemic Management Optimized   Start Date: 02/01/2020  Expected End Date: 05/01/2020  This Visit's Progress: On track  Priority: High  Note:   Objective:  Lab Results  Component Value Date   HGBA1C 6.7 (H) 11/29/2019 .   Lab Results  Component Value Date   CREATININE 0.96 11/29/2019   CREATININE 0.83 08/18/2019   CREATININE 1.09 (H) 04/19/2019 .   .Marland KitchenNo results found  for: EGFR Current Barriers:  [name] Knowledge Deficits related to basic Diabetes pathophysiology and self care/management Case Manager Clinical Goal(s):  [name] Over the next 90 days, patient will demonstrate improved adherence to prescribed treatment plan for diabetes self care/management as evidenced by:  . daily monitoring and recording of CBG  . adherence to ADA/ carb modified diet . exercise 3-5 days/week . adherence to prescribed medication regimen Interventions:  . Reviewed medications with patient and discussed importance of medication adherence . Discussed plans with  patient for ongoing care management follow up and provided patient with direct contact information for care management team . Advised patient, providing education and rationale, to check cbg daily before meals and record, calling the CCM team and or PCP for findings outside established parameters.   . Review of patient status, including review of consultants reports, relevant laboratory and other test results, and medications completed.  Anticipate A1C testing (point-of-care) every 3 to 6 months based on goal attainment.   Review mutually-set A1C goal or target range.   Anticipate use of antihyperglycemic with or without insulin and periodic adjustments; consider active involvement of pharmacist.   Compare self-reported symptoms of hypo or hyperglycemia to blood glucose levels, diet and fluid intake, current medications, psychosocial and physiologic stressors, change in activity and barriers to care adherence.  Patient Goals/Self-Care Activities . Over the next 90 days, patient will:  - Self administers oral medications as prescribed Self administers insulin as prescribed Self administers injectable DM medication (Ozempic, [name]) as prescribed Attends all scheduled provider appointments Checks blood sugars as prescribed and utilize hyper and hypoglycemia protocol as needed Adheres to prescribed ADA/carb modified - Perform daily foot checks - Schedule eye and dental exams - enter blood sugar readings and medication or insulin into daily log - take the blood sugar log to all doctor visits - take the blood sugar meter to all doctor visits    Follow Up Plan: Telephone follow up appointment with care management team member scheduled for: 02/10/20     Problem Identified: Disease Progression (Diabetes, Type 2)   Priority: High    Long-Range Goal: Disease Progression Prevented or Minimized   Start Date: 02/01/2020  Expected End Date: 05/01/2020  This Visit's Progress: On track  Priority:  High  Note:   Objective:  Lab Results  Component Value Date   HGBA1C 6.7 (H) 11/29/2019 .   Lab Results  Component Value Date   CREATININE 0.96 11/29/2019   CREATININE 0.83 08/18/2019   CREATININE 1.09 (H) 04/19/2019 .   [name] No results found for: EGFR Current Barriers:  [name] Knowledge Deficits related to basic Diabetes pathophysiology and self care/management Case Manager Clinical Goal(s):  [name] Over the next 90 days, patient will demonstrate improved adherence to prescribed treatment plan for diabetes self care/management as evidenced by:  . daily monitoring and recording of CBG  . adherence to ADA/ carb modified diet . exercise 3-5 days/week . adherence to prescribed medication regimen Interventions:   Ensure completion of annual comprehensive foot exam and dilated eye exam.    Implement additional individualized goals and interventions based on identified risk factors.   Encourage lifestyle changes, such as increased intake of plant-based foods, stress reduction, consistent physical activity and smoking cessation to prevent long-term complications and chronic disease.    Individualize activity and exercise recommendations while considering potential limitations, such as neuropathy, retinopathy or the ability to prevent hyperglycemia or hypoglycemia.   . Provided education to patient about basic DM disease process . Discussed  plans with patient for ongoing care management follow up and provided patient with direct contact information for care management team . Advised patient, providing education and rationale, to check cbg 1-2 times daily before meals and record, calling the CCM team and or PCP for findings outside established parameters.   Patient Goals/Self-Care Activities . Over the next 90 days, patient will:  - Self administers oral medications as prescribed Self administers insulin as prescribed Self administers injectable DM medication (Ozempic, [name] ) as  prescribed Attends all scheduled provider appointments Checks blood sugars as prescribed and utilize hyper and hypoglycemia protocol as needed Adheres to prescribed ADA/carb modified - set target A1C Follow Up Plan: Telephone follow up appointment with care management team member scheduled for: 02/10/20     Patient Care Plan: Hypertension (Adult)    Problem Identified: Hypertension (Hypertension)   Priority: High    Long-Range Goal: Hypertension Monitored   Start Date: 02/01/2020  Expected End Date: 05/01/2020  This Visit's Progress: On track  Priority: High  Note:   Objective:  . Last practice recorded BP readings:  BP Readings from Last 3 Encounters:  01/06/20 (!) 149/69  11/29/19 130/68  11/29/19 130/68 .   [name] Most recent eGFR/CrCl: No results found for: EGFR  No components found for: CRCL Current Barriers:  [name] Knowledge Deficits related to basic understanding of hypertension pathophysiology and self care management Case Manager Clinical Goal(s):  [name] Over the next 90 days, patient will verbalize understanding of plan for hypertension management Interventions:  . Evaluation of current treatment plan related to hypertension self management and patient's adherence to plan as established by provider. . Provided education to patient re: stroke prevention, s/s of heart attack and stroke, DASH diet, complications of uncontrolled blood pressure . Reviewed medications with patient and discussed importance of compliance . Discussed plans with patient for ongoing care management follow up and provided patient with direct contact information for care management team . Advised patient, providing education and rationale, to monitor blood pressure daily and record, calling PCP for findings outside established parameters.  . Reviewed scheduled/upcoming provider appointments including: PCP OV 02/09/20 '@9' :45 am w/Dr. Baird [facility]   Encourage continued use of home blood pressure monitoring and recording  in blood pressure log; include symptoms of hypotension or potential medication side effects in log.   Review blood pressure measurements taken inside and outside of the provider office; establish baseline and monitor trends; compare to target ranges or patient goal.  Patient Goals/Self-Care Activities . Over the next 90 days, patient will:  - Self administers medications as prescribed Attends all scheduled provider appointments Calls provider office for new concerns, questions, or BP outside discussed parameters Checks BP and records as discussed Follows a low sodium diet/DASH diet - check blood pressure 3 times per week - write blood pressure results in a log or diary  Follow Up Plan: Telephone follow up appointment with care management team member scheduled for: 02/10/20     Problem Identified: Disease Progression (Hypertension)   Priority: High    Goal: Disease Progression Prevented or Minimized   Start Date: 02/01/2020  Expected End Date: 05/01/2020  This Visit's Progress: On track  Priority: High  Note:   Objective:  . Last practice recorded BP readings:  BP Readings from Last 3 Encounters:  01/06/20 (!) 149/69  11/29/19 130/68  11/29/19 130/68 .   [name] Most recent eGFR/CrCl: No results found for: EGFR  No components found for: CRCL Current Barriers:  [name] Knowledge Deficits related to  basic understanding of hypertension pathophysiology and self care management Case Manager Clinical Goal(s):  [name] Over the next 90 days, patient will demonstrate improved adherence to prescribed treatment plan for hypertension as evidenced by taking all medications as prescribed, monitoring and recording blood pressure as directed, adhering to low sodium/DASH diet Interventions:  . Evaluation of current treatment plan related to hypertension self management and patient's adherence to plan as established by provider. . Provided education to patient re: stroke prevention, s/s of heart attack and stroke,  DASH diet, complications of uncontrolled blood pressure . Reviewed medications with patient and discussed importance of compliance . Discussed plans with patient for ongoing care management follow up and provided patient with direct contact information for care management team . Advised patient, providing education and rationale, to monitor blood pressure daily and record, calling PCP for findings outside established parameters.   Promote a healthy diet that includes primarily plant-based foods, such as fruits, vegetables, whole grains, beans and legumes, low-fat dairy and lean meats.    Consider moderate reduction in sodium intake by avoiding the addition of salt to prepared foods and limiting processed meats, canned soup, frozen meals and salty snacks.    Promote a regular, daily exercise goal of 150 minutes per week of moderate exercise based on tolerance, ability and patient choice; consider referral to physical therapist, community wellness and/or activity program.   Review sources of stress; explore current coping strategies and encourage use of mindfulness, yoga, meditation or exercise to manage stress.   Patient Goals/Self-Care Activities . Over the next 90 days, patient will:  - Self administers medications as prescribed Attends all scheduled provider appointments Calls provider office for new concerns, questions, or BP outside discussed parameters Checks BP and records as discussed Follows a low sodium diet/DASH diet - agree to work together to make changes - ask questions to understand - learn about high blood pressure Follow Up Plan: Telephone follow up appointment with care management team member scheduled for: 02/10/20     Problem Identified: Resistant Hypertension (Hypertension)   Priority: High    Patient Care Plan: General Plan of Care (Adult)    Problem Identified: Therapeutic Alliance (General Plan of Care)     Problem Identified: Health Promotion or Disease  Self-Management (General Plan of Care)     Problem Identified: Coping Skills (General Plan of Care)     Long-Range Goal: Coping Skills Enhanced   Start Date: 02/01/2020  Expected End Date: 05/01/2020  This Visit's Progress: On track  Priority: High  Note:   Current Barriers:   Ineffective Self Health Maintenance  Currently UNABLE TO independently self manage needs related to chronic health conditions.   Knowledge Deficits related to short term plan for care coordination needs and long term plans for chronic disease management needs Nurse Case Manager Clinical Goal(s):   Over the next 90 days, patient will work with care management team to address care coordination and chronic disease management needs related to Disease Management  Educational Needs  Care Coordination  Medication Management and Education  Psychosocial Support   Interventions:   Answered patient's questions related to Acupuncture therapy and determined patient is considering this therapy   Promote a regular daily exercise program based on tolerance, ability and patient choice to support positive thinking about disease or aging.    Consider the use of meditative movement therapy such as tai chi, yoga or qigong and massage therapy  Patient Goals/Self Care/Activities:  - begin personal counseling - check out volunteer  opportunities - practice relaxation or meditation daily - talk about feelings with a friend, family or spiritual advisor - practice positive thinking and self-talk  Follow Up Plan: Telephone follow up appointment with care management team member scheduled for: 02/10/20     Problem Identified: Quality of Life (General Plan of Care)     Long-Range Goal: Quality of Life Maintained   Start Date: 02/01/2020  Expected End Date: 05/01/2020  This Visit's Progress: On track  Priority: High  Note:   Current Barriers:   Ineffective Self Health Maintenance  Currently UNABLE TO independently self manage  needs related to chronic health conditions.   Knowledge Deficits related to short term plan for care coordination needs and long term plans for chronic disease management needs Nurse Case Manager Clinical Goal(s):   Over the next 90 days, patient will work with care management team to address care coordination and chronic disease management needs related to Disease Management  Educational Needs  Care Coordination  Medication Management and Education  Psychosocial Support   Interventions:   Assess patient's thoughts about quality of life, goals and expectations, and dissatisfaction or desire to improve.   Provide patient an opportunity to share by storytelling or a "life review" to give positive meaning to life and to assist with coping and negative experiences.   Assess and monitor for signs/symptoms of psychosocial concerns, especially depression or ideations regarding harm to others or self; provide or refer for mental health services as needed.   Promote activities to decrease social isolation such as group support or social, leisure and recreational activities, employment, use of social media; consider safety concerns about being out of home for activities.  Patient Goals/Self Care/Activities:  - discuss my treatment options with the doctor or nurse - learn something new by asking, reading and searching the Internet every day - make shared treatment decisions with doctor  Follow Up Plan: Telephone follow up appointment with care management team member scheduled for: 02/10/20        Patient Care Plan: Social Work Care Plan    Problem Identified: Care Coordination     Long-Range Goal: Identify resources to assist with vehicle adaptations   Start Date: 02/09/2020  Expected End Date: 05/09/2020  This Visit's Progress: On track  Priority: Medium  Note:   Current Barriers:  . Transportation- unable to transport self due to barriers surrounding use of foot pedals . Limited  knowledge of resources to assist with vehicle adaptations . Chronic conditions including DM II, CKD III, and HTN which put patient at increased risk of hospitalization  Social Work Clinical Goal(s):  [name] Over the next 120 days the patient will work with SW to identify resources to assist with desired vehicle adaptions  Interventions: . 1:1 collaboration with Glendale Chard, MD regarding development and update of comprehensive plan of care as evidenced by provider attestation and co-signature . Inter-disciplinary care team collaboration (see longitudinal plan of care) . Inbound call received from the patient who indicates she would like assistance identifying resources to assist with vehicle adaptations . Discussed the patient is unable to provide her own transportation due to barriers in operating foot pedals . The patient has recently driven a church vehicle that had hand controls and was able to operate this vehicle with ease . Determined the patient has spoken with her primary provider about the desire to have a vehicle with hand controls and was instructed to contact the CCM team . Patient indicates plans to contact [facility]  Care Manager to discuss need for OT evaluation  . Advised the patient SW would collaborate with RN Care Manager regarding patient interest in obtaining a vehicle with hand controls . Assessed patients ability to cover the cost of adaptations - patient indicates she needs assistance . Advised the patient SW would outreach [facility] to determine ability to assist with patient resource needs . Unsuccessful outbound call placed to [facility] - voice message left requesting a return call  Patient Goals/Self-Care Activities Over the next 45 days, patient will:   - Patient will self administer medications as prescribed Patient will attend all scheduled provider appointments Patient will call provider office for new concerns or  questions Contact SW as needed prior to next scheduled call  Follow up Plan: SW will follow up with patient by phone over the next month        Follow Up Plan: SW will follow up with the patient over the next month   Daneen Schick, BSW, CDP Social Worker, Certified Dementia Practitioner [facility] [phone]  Total time spent performing care coordination and/or care management activities with the patient by phone or face to face = 15 minutes.

## 2020-02-09 NOTE — Patient Instructions (Signed)
   Goals we discussed today:  Goals Addressed            This Visit's Progress   . Work with SW to manage care coordination needs       Timeframe:  Long-Range Goal Priority:  [employer] Start Date:   12.16.21                          Expected End Date:   3.16.22                    Next date of contact: 1.7.22  Patient Goals/Self-Care Activities Over the next 45 days, patient will:   - Patient will self administer medications as prescribed Patient will attend all scheduled provider appointments Patient will call provider office for new concerns or questions Contact SW as needed prior to next scheduled call

## 2020-02-10 ENCOUNTER — Telehealth: Payer: Self-pay

## 2020-02-10 LAB — LIPID PANEL
Chol/HDL Ratio: 2.5 ratio (ref 0.0–4.4)
Cholesterol, Total: 150 mg/dL (ref 100–199)
HDL: 61 mg/dL (ref >39)
LDL Chol Calc (NIH): 77 mg/dL (ref 0–99)
Triglycerides: 55 mg/dL (ref 0–149)
VLDL Cholesterol Cal: 12 mg/dL (ref 5–40)

## 2020-02-10 LAB — VITAMIN D 25 HYDROXY (VIT D DEFICIENCY, FRACTURES): Vit D, 25-Hydroxy: 81.3 ng/mL (ref 30.0–100.0)

## 2020-02-10 LAB — CBC
Hematocrit: 34 % (ref 34.0–46.6)
Hemoglobin: 11.3 g/dL (ref 11.1–15.9)
MCH: 28.3 pg (ref 26.6–33.0)
MCHC: 33.2 g/dL (ref 31.5–35.7)
MCV: 85 fL (ref 79–97)
Platelets: 223 10*3/uL (ref 150–450)
RBC: 3.99 x10E6/uL (ref 3.77–5.28)
RDW: 13.6 % (ref 11.7–15.4)
WBC: 10.8 10*3/uL (ref 3.4–10.8)

## 2020-02-10 NOTE — Chronic Care Management (AMB) (Signed)
Chronic Care Management Pharmacy Assistant   Name: Teresa Golden  MRN: [id number] DOB: 1945-04-07  Reason for Encounter: Medication Review  Patient Questions:  PCP : Glendale Chard, MD  Allergies:   Allergies  Allergen Reactions  . Meloxicam Other (See Comments)    Fever; muscle aches; "flu-like" symptoms  . Other     Rose fever and hay fever     Medications: Outpatient Encounter Medications as of 02/10/2020  Medication Sig  . amLODipine (NORVASC) 5 MG tablet Take 1 tablet (5 mg total) by mouth daily.  . B Complex-C (B-COMPLEX WITH VITAMIN C) tablet Take 1 tablet by mouth daily.  . Biotin 10000 MCG TABS Take 1 tablet by mouth daily.  . carvedilol (COREG CR) 20 MG 24 hr capsule TAKE 1 CAPSULE DAILY  . Cholecalciferol 5000 units TABS Take 5,000 Units by mouth daily.  . Colchicine 0.6 MG CAPS TAKE 1 CAPSULE DAILY AS    NEEDED  . Cyanocobalamin (VITAMIN B-12) 2500 MCG SUBL Take 2,500 mcg by mouth daily.  . diclofenac Sodium (VOLTAREN) 1 % GEL APPLY 2 GRAMS TO AFFECTED AREA(S) 4 TIMES DAILY AS NEEDED  . docusate sodium (COLACE) 100 MG capsule Take 1 capsule (100 mg total) by mouth 2 (two) times daily. (Patient not taking: Reported on 02/09/2020)  . famotidine (PEPCID) 20 MG tablet Take 20 mg by mouth daily. PRN  . gabapentin (NEURONTIN) 300 MG capsule Take 600 mg by mouth at bedtime. Pt taking 600 MG at bedtime  . glucose blood (ONETOUCH VERIO) test strip Use as instructed to check blood sugars 2 times per day dx: e11.65  . insulin degludec (TRESIBA FLEXTOUCH) 100 UNIT/ML SOPN FlexTouch Pen Inject 0.09 mLs (9 Units total) into the skin daily at 10 pm. (Patient taking differently: Inject 8 Units into the skin daily at 10 pm.)  . losartan (COZAAR) 100 MG tablet Take 1 tablet (100 mg total) by mouth daily.  . Multiple Vitamins-Minerals (MULTIVITAMIN WITH MINERALS) tablet Take 1 tablet by mouth daily. Women's 50+  . naproxen sodium (ANAPROX) 220 MG tablet Take 440 mg by mouth 2 (two)  times daily as needed (pain). 2 tablets every night at bedtime  . OneTouch Delica Lancets 78E MISC Use as instructed to check blood sugars 2 times per day dx: e11.65  . OZEMPIC, 1 MG/DOSE, 2 MG/1.5ML SOPN INJECT INTO SKIN ONCE A WEEK (Patient taking differently: 1mg )  . simvastatin (ZOCOR) 10 MG tablet TAKE 1 TABLET DAILY  . SYNTHROID 25 MCG tablet Take 0.5 tablet (12.5 mcg) by mouth on Mondays through Fridays, then take 1 tablet (25 mcg) by mouth on Saturdays and Sundays.   No facility-administered encounter medications on file as of 02/10/2020.    Current Diagnosis: Patient Active Problem List   Diagnosis Date Noted  . Type 2 diabetes mellitus with stage 3 chronic kidney disease, with long-term current use of insulin ([place]) 11/29/2019  . Panniculitis 09/30/2019  . Back pain 09/30/2019  . Hepatoma ([place]) 01/21/2019  . Other cirrhosis of liver ([place]) 07/05/2018  . Hypertensive nephropathy 07/05/2018  . Chronic renal disease, stage III ([place]) 07/05/2018  . Chronic left shoulder pain 07/05/2018  . Hypothyroidism 02/06/2018  . Hepatitis C virus infection cured after antiviral drug therapy 02/04/2018  . Osteoarthritis of right knee 07/30/2017  . S/P laparoscopic sleeve gastrectomy July 2018 08/26/2016  . Preop cardiovascular exam 07/30/2016  . Dyspnea on exertion 07/30/2016  . Class 2 severe obesity due to excess calories with serious comorbidity and body  mass index (BMI) of 39.0 to 39.9 in adult [facility]) 07/30/2016  . Bilateral lower extremity edema 07/30/2016    Follow-Up:  Patient Assistance Coordination - New patient assistance form filled out for [employer] for [employer]. Waiting for provider and patient's signature.  Called patient to have her come in to PCP office to sign application and to bring proof of income. Per patient she will be in on 02/14/2020.  [name], CPP notified  Teresa Golden, [place] Pharmacist Assistant [phone]

## 2020-02-10 NOTE — Telephone Encounter (Cosign Needed)
  Chronic Care Management   Outreach Note  02/10/2020 Name: Teresa Golden MRN: [id number] DOB: [birth year]  Referred by: Glendale Chard, MD Reason for referral : Chronic Care Management (Inbound Call from patient )   Teresa Golden is enrolled in a Managed Medicaid Health Plan: No  Voice message received from patient requesting a call back, she prefers a call back next week. The patient was referred to the case management team for assistance with care management and care coordination.   Follow Up Plan: Telephone follow up appointment with care management team member scheduled for: 02/13/20  Barb Merino, RN, BSN, CCM Care Management Coordinator [facility]  Direct Phone: [phone]

## 2020-02-13 ENCOUNTER — Telehealth: Payer: Medicare HMO

## 2020-02-13 ENCOUNTER — Ambulatory Visit: Payer: Self-pay

## 2020-02-13 ENCOUNTER — Other Ambulatory Visit: Payer: Self-pay

## 2020-02-13 DIAGNOSIS — E1122 Type 2 diabetes mellitus with diabetic chronic kidney disease: Secondary | ICD-10-CM

## 2020-02-13 DIAGNOSIS — N1831 Chronic kidney disease, stage 3a: Secondary | ICD-10-CM

## 2020-02-13 DIAGNOSIS — F419 Anxiety disorder, unspecified: Secondary | ICD-10-CM

## 2020-02-13 DIAGNOSIS — N183 Chronic kidney disease, stage 3 unspecified: Secondary | ICD-10-CM

## 2020-02-14 ENCOUNTER — Ambulatory Visit (INDEPENDENT_AMBULATORY_CARE_PROVIDER_SITE_OTHER): Payer: Medicare HMO

## 2020-02-14 ENCOUNTER — Other Ambulatory Visit: Payer: Self-pay | Admitting: Internal Medicine

## 2020-02-14 VITALS — BP 110/75 | HR 98 | Temp 98.1°F | Ht 66.0 in | Wt 212.6 lb

## 2020-02-14 DIAGNOSIS — R8290 Unspecified abnormal findings in urine: Secondary | ICD-10-CM | POA: Diagnosis not present

## 2020-02-14 DIAGNOSIS — R350 Frequency of micturition: Secondary | ICD-10-CM

## 2020-02-14 LAB — POCT URINALYSIS DIPSTICK
Bilirubin, UA: NEGATIVE
Blood, UA: NEGATIVE
Glucose, UA: NEGATIVE
Ketones, UA: NEGATIVE
Nitrite, UA: NEGATIVE
Protein, UA: NEGATIVE
Spec Grav, UA: 1.025 (ref 1.010–1.025)
Urobilinogen, UA: 0.2 E.U./dL
pH, UA: 7 (ref 5.0–8.0)

## 2020-02-14 MED ORDER — NITROFURANTOIN MONOHYD MACRO 100 MG PO CAPS: 100.0000 mg | ORAL_CAPSULE | Freq: Two times a day (BID) | ORAL | 0 refills | Status: AC

## 2020-02-14 NOTE — Addendum Note (Signed)
Addended by: Azalee Course T on: 02/14/2020 12:23 PM   Modules accepted: Orders

## 2020-02-14 NOTE — Patient Instructions (Signed)
Acute Urinary Retention, Female  Acute urinary retention means that you cannot pee (urinate) at all, or that you pee too little and your bladder is not emptied completely. If it is not treated, it can lead to kidney damage or other serious problems. Follow these instructions at home:  Take over-the-counter and prescription medicines only as told by your doctor. Ask your doctor what medicines you should stay away from. Do not take any medicine unless your doctor says it is okay to do so.  If you were sent home with a tube that drains pee from the bladder (catheter), take care of it as told by your doctor.  Drink enough fluid to keep your pee clear or pale yellow.  If you were given an antibiotic, take it as told by your doctor. Do not stop taking the antibiotic even if you start to feel better.  Do not use any products that contain nicotine or tobacco, such as cigarettes and e-cigarettes. If you need help quitting, ask your doctor.  Watch for changes in your symptoms. Tell your doctor about them.  If told, keep track of any changes in your blood pressure at home. Tell your doctor about them.  Keep all follow-up visits as told by your doctor. This is important. Contact a doctor if:  You have spasms or you leak pee when you have spasms. Get help right away if:  You have chills or a fever.  You have blood in your pee.  You have a tube that drains the bladder and: ? The tube stops draining pee. ? The tube falls out. Summary  Acute urinary retention means that you cannot pee at all, or that you pee too little and your bladder is not emptied completely. If it is not treated, it can result in kidney damage or other serious problems.  If you were sent home with a tube that drains pee from the bladder, take care of it as told by your doctor.  Pay attention to any changes in your symptoms. Tell your doctor about them. This information is not intended to replace advice given to you by your  health care provider. Make sure you discuss any questions you have with your health care provider. Document Revised: 01/23/2017 Document Reviewed: 03/14/2016 Elsevier Patient Education  2020 [place]. Diabetes Basics  Diabetes (diabetes mellitus) is a long-term (chronic) disease. It occurs when the body does not properly use sugar (glucose) that is released from food after you eat. Diabetes may be caused by one or both of these problems:  Your pancreas does not make enough of a hormone called insulin.  Your body does not react in a normal way to insulin that it makes. Insulin lets sugars (glucose) go into cells in your body. This gives you energy. If you have diabetes, sugars cannot get into cells. This causes high blood sugar (hyperglycemia). Follow these instructions at home: How is diabetes treated? You may need to take insulin or other diabetes medicines daily to keep your blood sugar in balance. Take your diabetes medicines every day as told by your doctor. List your diabetes medicines here: Diabetes medicines  Name of medicine: ______________________________ ? Amount (dose): _______________ Time (a.m./p.m.): _______________ Notes: ___________________________________  Name of medicine: ______________________________ ? Amount (dose): _______________ Time (a.m./p.m.): _______________ Notes: ___________________________________  Name of medicine: ______________________________ ? Amount (dose): _______________ Time (a.m./p.m.): _______________ Notes: ___________________________________ If you use insulin, you will learn how to give yourself insulin by injection. You may need to adjust the amount  based on the food that you eat. List the types of insulin you use here: Insulin  Insulin type: ______________________________ ? Amount (dose): _______________ Time (a.m./p.m.): _______________ Notes: ___________________________________  Insulin type:  ______________________________ ? Amount (dose): _______________ Time (a.m./p.m.): _______________ Notes: ___________________________________  Insulin type: ______________________________ ? Amount (dose): _______________ Time (a.m./p.m.): _______________ Notes: ___________________________________  Insulin type: ______________________________ ? Amount (dose): _______________ Time (a.m./p.m.): _______________ Notes: ___________________________________  Insulin type: ______________________________ ? Amount (dose): _______________ Time (a.m./p.m.): _______________ Notes: ___________________________________ How do I manage my blood sugar?  Check your blood sugar levels using a blood glucose monitor as directed by your doctor. Your doctor will set treatment goals for you. Generally, you should have these blood sugar levels:  Before meals (preprandial): 80-130 mg/dL (4.4-7.2 mmol/L).  After meals (postprandial): below 180 mg/dL (10 mmol/L).  A1c level: less than 7%. Write down the times that you will check your blood sugar levels: Blood sugar checks  Time: _______________ Notes: ___________________________________  Time: _______________ Notes: ___________________________________  Time: _______________ Notes: ___________________________________  Time: _______________ Notes: ___________________________________  Time: _______________ Notes: ___________________________________  Time: _______________ Notes: ___________________________________  What do I need to know about low blood sugar? Low blood sugar is called hypoglycemia. This is when blood sugar is at or below 70 mg/dL (3.9 mmol/L). Symptoms may include:  Feeling: ? Hungry. ? Worried or nervous (anxious). ? Sweaty and clammy. ? Confused. ? Dizzy. ? Sleepy. ? Sick to your stomach (nauseous).  Having: ? A fast heartbeat. ? A headache. ? A change in your vision. ? Tingling or no feeling (numbness) around the mouth, lips, or  tongue. ? Jerky movements that you cannot control (seizure).  Having trouble with: ? Moving (coordination). ? Sleeping. ? Passing out (fainting). ? Getting upset easily (irritability). Treating low blood sugar To treat low blood sugar, eat or drink something sugary right away. If you can think clearly and swallow safely, follow the 15:15 rule:  Take 15 grams of a fast-acting carb (carbohydrate). Talk with your doctor about how much you should take.  Some fast-acting carbs are: ? Sugar tablets (glucose pills). Take 3-4 glucose pills. ? 6-8 pieces of hard candy. ? 4-6 oz (120-150 mL) of fruit juice. ? 4-6 oz (120-150 mL) of regular (not diet) soda. ? 1 Tbsp (15 mL) honey or sugar.  Check your blood sugar 15 minutes after you take the carb.  If your blood sugar is still at or below 70 mg/dL (3.9 mmol/L), take 15 grams of a carb again.  If your blood sugar does not go above 70 mg/dL (3.9 mmol/L) after 3 tries, get help right away.  After your blood sugar goes back to normal, eat a meal or a snack within 1 hour. Treating very low blood sugar If your blood sugar is at or below 54 mg/dL (3 mmol/L), you have very low blood sugar (severe hypoglycemia). This is an emergency. Do not wait to see if the symptoms will go away. Get medical help right away. Call your local emergency services (911 in the [place] Do not drive yourself to the hospital. Questions to ask your health care provider  Do I need to meet with a diabetes educator?  What equipment will I need to care for myself at home?  What diabetes medicines do I need? When should I take them?  How often do I need to check my blood sugar?  What number can I call if I have questions?  When is my next doctor's visit?  Where can I find  a support group for people with diabetes? Where to find more information  American Diabetes Association: www.diabetes.org  American Association of Diabetes Educators:  www.diabeteseducator.org/patient-resources Contact a doctor if:  Your blood sugar is at or above 240 mg/dL (13.3 mmol/L) for 2 days in a row.  You have been sick or have had a fever for 2 days or more, and you are not getting better.  You have any of these problems for more than 6 hours: ? You cannot eat or drink. ? You feel sick to your stomach (nauseous). ? You throw up (vomit). ? You have watery poop (diarrhea). Get help right away if:  Your blood sugar is lower than 54 mg/dL (3 mmol/L).  You get confused.  You have trouble: ? Thinking clearly. ? Breathing. Summary  Diabetes (diabetes mellitus) is a long-term (chronic) disease. It occurs when the body does not properly use sugar (glucose) that is released from food after digestion.  Take insulin and diabetes medicines as told.  Check your blood sugar every day, as often as told.  Keep all follow-up visits as told by your doctor. This is important. This information is not intended to replace advice given to you by your health care provider. Make sure you discuss any questions you have with your health care provider. Document Revised: 11/03/2018 Document Reviewed: 05/15/2017 Elsevier Patient Education  [place].

## 2020-02-14 NOTE — Progress Notes (Signed)
Patient is here to repeat urine specimen. She does complain of soreness in pelvic area. Also urine having a pungent smell.

## 2020-02-15 ENCOUNTER — Encounter: Payer: Self-pay | Admitting: Internal Medicine

## 2020-02-15 NOTE — Chronic Care Management (AMB) (Signed)
Chronic Care Management   Follow Up Note   02/13/2020 Name: JALAYSHA GRAYBEAL MRN: [id number] DOB: February 19, 1946  Referred by: Glendale Chard, MD Reason for referral : Chronic Care Management (RN CM FU Call)   Teresa Golden is a 74 y.o. year old female who is a primary care patient of Glendale Chard, MD. The CCM team was consulted for assistance with chronic disease management and care coordination needs.    Review of patient status, including review of consultants reports, relevant laboratory and other test results, and collaboration with appropriate care team members and the patient's provider was performed as part of comprehensive patient evaluation and provision of chronic care management services.    SDOH (Social Determinants of Health) assessments performed: No See Care Plan activities for detailed interventions related to [place])  Completed CCM RN CM outbound follow up call to patient for a care plan update.    Outpatient Encounter Medications as of 02/13/2020  Medication Sig  . Colchicine 0.6 MG CAPS TAKE 1 CAPSULE DAILY AS    NEEDED  . amLODipine (NORVASC) 5 MG tablet Take 1 tablet (5 mg total) by mouth daily.  . B Complex-C (B-COMPLEX WITH VITAMIN C) tablet Take 1 tablet by mouth daily.  . Biotin 10000 MCG TABS Take 1 tablet by mouth daily.  . carvedilol (COREG CR) 20 MG 24 hr capsule TAKE 1 CAPSULE DAILY  . Cholecalciferol 5000 units TABS Take 5,000 Units by mouth daily.  . Cyanocobalamin (VITAMIN B-12) 2500 MCG SUBL Take 2,500 mcg by mouth daily.  . diclofenac Sodium (VOLTAREN) 1 % GEL APPLY 2 GRAMS TO AFFECTED AREA(S) 4 TIMES DAILY AS NEEDED  . docusate sodium (COLACE) 100 MG capsule Take 1 capsule (100 mg total) by mouth 2 (two) times daily. (Patient not taking: Reported on 02/09/2020)  . famotidine (PEPCID) 20 MG tablet Take 20 mg by mouth daily. PRN  . gabapentin (NEURONTIN) 300 MG capsule Take 600 mg by mouth at bedtime. Pt taking 600 MG at bedtime  . glucose blood  (ONETOUCH VERIO) test strip Use as instructed to check blood sugars 2 times per day dx: e11.65  . insulin degludec (TRESIBA FLEXTOUCH) 100 UNIT/ML SOPN FlexTouch Pen Inject 0.09 mLs (9 Units total) into the skin daily at 10 pm. (Patient taking differently: Inject 8 Units into the skin daily at 10 pm.)  . losartan (COZAAR) 100 MG tablet Take 1 tablet (100 mg total) by mouth daily.  . Multiple Vitamins-Minerals (MULTIVITAMIN WITH MINERALS) tablet Take 1 tablet by mouth daily. Women's 50+  . naproxen sodium (ANAPROX) 220 MG tablet Take 440 mg by mouth 2 (two) times daily as needed (pain). 2 tablets every night at bedtime  . OneTouch Delica Lancets 99991111 MISC Use as instructed to check blood sugars 2 times per day dx: e11.65  . OZEMPIC, 1 MG/DOSE, 2 MG/1.5ML SOPN INJECT INTO SKIN ONCE A WEEK (Patient taking differently: 1mg )  . simvastatin (ZOCOR) 10 MG tablet TAKE 1 TABLET DAILY  . SYNTHROID 25 MCG tablet Take 0.5 tablet (12.5 mcg) by mouth on Mondays through Fridays, then take 1 tablet (25 mcg) by mouth on Saturdays and Sundays.   No facility-administered encounter medications on file as of 02/13/2020.     Objective:  Lab Results  Component Value Date   HGBA1C 6.7 (H) 11/29/2019   HGBA1C 6.5 (H) 08/18/2019   HGBA1C 6.3 (H) 04/19/2019   Lab Results  Component Value Date   MICROALBUR 10 02/09/2020   LDLCALC 77 02/09/2020   CREATININE  0.96 11/29/2019   BP Readings from Last 3 Encounters:  02/14/20 110/75  02/09/20 122/80  01/06/20 (!) 149/69    Goals Addressed     Priority: High    Patient Care Plan: General Plan of Care (Adult)    Problem Identified: Quality of Life (General Plan of Care)     Long-Range Goal: Quality of Life Maintained   Start Date: 02/01/2020  Expected End Date: 05/01/2020  Recent Progress: On track  Priority: High  Note:   Current Barriers:   Ineffective Self Health Maintenance  Currently UNABLE TO independently self manage needs related to chronic health  conditions.   Knowledge Deficits related to short term plan for care coordination needs and long term plans for chronic disease management needs Nurse Case Manager Clinical Goal(s):   Over the next 90 days, patient will work with care management team to address care coordination and chronic disease management needs related to Disease Management  Educational Needs  Care Coordination  Medication Management and Education  Psychosocial Support   Interventions:  02/13/20 inbound call completed with patient   Determined patient feels she may have a UTI  Determined she is scheduled for a PCP office visit tomorrow am (02/14/20) in order to provide a urine specimen  Educated patient on s/s suggestive of UTI and when to call the doctor promptly for early treatment   Educated on ways to avoid UTI related to increasing water intake, using good perineal hygiene, wiping from front to back after urinating or having a bowel movement, urinating after sexual intercourse, avoiding bubble baths and or wearing thong underwear Patient Goals/Self Care/Activities:  Over the next 90 days, patient will  - keep PCP office visit as scheduled for 02/14/20 in order to provide a clean catch urine sample to evaluate for UTI - discuss my treatment options with the doctor or nurse - learn something new by asking, reading and searching the Internet every day - make shared treatment decisions with doctor  - increase water intake to 64oz-80oz per day - contact PCP to report s/s of UTI promptly  Follow Up Plan: Telephone follow up appointment with care management team member scheduled for: 04/13/20    Plan:   Telephone follow up appointment with care management team member scheduled for: 04/13/20  Barb Merino, RN, BSN, CCM Care Management Coordinator [facility]  Direct Phone: [phone]

## 2020-02-15 NOTE — Patient Instructions (Signed)
Visit Information  Goals Addressed    . Lifestyle Change-Hypertension   On track    Timeframe:  Long-Range Goal Priority:  High Start Date: 02/01/20                            Expected End Date: 05/01/20                     Follow Up Date 02/08/20   - agree to work together to make changes - ask questions to understand - learn about high blood pressure    Why is this important?    The changes that you are asked to make may be hard to do.   This is especially true when the changes are life-long.   Knowing why it is important to you is the first step.   Working on the change with your family or support person helps you not feel alone.   Reward yourself and family or support person when goals are met. This can be an activity you choose like bowling, hiking, biking, swimming or shooting hoops.     Notes:     [name] [facility] My Quality of Life       Timeframe:  Long-Range Goal Priority:  High Start Date: 02/01/20                           Expected End Date: 05/01/20                    Follow Up Date 04/13/20  - keep PCP office visit as scheduled for 02/14/20 in order to provide a clean catch urine sample to evaluate for UTI - discuss my treatment options with the doctor or nurse - learn something new by asking, reading and searching the Internet every day - make shared treatment decisions with doctor  - increase water intake to 64oz-80oz per day - contact PCP to report s/s of UTI promptly  Why is this important?    Having a long-term illness can be scary.   It can also be stressful for you and your caregiver.   These steps may help.    Notes:        The patient verbalized understanding of instructions, educational materials, and care plan provided today and declined offer to receive copy of patient instructions, educational materials, and care plan.   Telephone follow up appointment with care management team member scheduled for: 04/13/20  Lynne Logan, RN

## 2020-02-16 ENCOUNTER — Telehealth: Payer: Medicare HMO

## 2020-02-16 ENCOUNTER — Encounter: Payer: Self-pay | Admitting: Internal Medicine

## 2020-02-16 ENCOUNTER — Ambulatory Visit: Payer: Self-pay

## 2020-02-16 ENCOUNTER — Other Ambulatory Visit: Payer: Self-pay

## 2020-02-16 DIAGNOSIS — E1122 Type 2 diabetes mellitus with diabetic chronic kidney disease: Secondary | ICD-10-CM

## 2020-02-16 DIAGNOSIS — N183 Chronic kidney disease, stage 3 unspecified: Secondary | ICD-10-CM

## 2020-02-16 DIAGNOSIS — F419 Anxiety disorder, unspecified: Secondary | ICD-10-CM

## 2020-02-16 DIAGNOSIS — N1831 Chronic kidney disease, stage 3a: Secondary | ICD-10-CM

## 2020-02-16 NOTE — Chronic Care Management (AMB) (Signed)
Chronic Care Management   Follow Up Note   02/16/2020 Name: Teresa Golden MRN: [id number] DOB: [birth year]  Referred by: Dorothyann Peng, MD Reason for referral : Chronic Care Management (Inbound Call from patient )   Teresa Golden is a 74 y.o. year old female who is a primary care patient of Dorothyann Peng, MD. The CCM team was consulted for assistance with chronic disease management and care coordination needs.    Review of patient status, including review of consultants reports, relevant laboratory and other test results, and collaboration with appropriate care team members and the patient's provider was performed as part of comprehensive patient evaluation and provision of chronic care management services.    SDOH (Social Determinants of Health) assessments performed: No See Care Plan activities for detailed interventions related to [facility])   Completed inbound call with patient.     Outpatient Encounter Medications as of 02/16/2020  Medication Sig  . amLODipine (NORVASC) 5 MG tablet Take 1 tablet (5 mg total) by mouth daily.  . B Complex-C (B-COMPLEX WITH VITAMIN C) tablet Take 1 tablet by mouth daily.  . Biotin 25427 MCG TABS Take 1 tablet by mouth daily.  . carvedilol (COREG CR) 20 MG 24 hr capsule TAKE 1 CAPSULE DAILY  . Cholecalciferol 5000 units TABS Take 5,000 Units by mouth daily.  . Colchicine 0.6 MG CAPS TAKE 1 CAPSULE DAILY AS    NEEDED  . Cyanocobalamin (VITAMIN B-12) 2500 MCG SUBL Take 2,500 mcg by mouth daily.  . diclofenac Sodium (VOLTAREN) 1 % GEL APPLY 2 GRAMS TO AFFECTED AREA(S) 4 TIMES DAILY AS NEEDED  . docusate sodium (COLACE) 100 MG capsule Take 1 capsule (100 mg total) by mouth 2 (two) times daily. (Patient not taking: Reported on 02/09/2020)  . famotidine (PEPCID) 20 MG tablet Take 20 mg by mouth daily. PRN  . gabapentin (NEURONTIN) 300 MG capsule Take 600 mg by mouth at bedtime. Pt taking 600 MG at bedtime  . glucose blood (ONETOUCH VERIO) test strip Use as  instructed to check blood sugars 2 times per day dx: e11.65  . insulin degludec (TRESIBA FLEXTOUCH) 100 UNIT/ML SOPN FlexTouch Pen Inject 0.09 mLs (9 Units total) into the skin daily at 10 pm. (Patient taking differently: Inject 8 Units into the skin daily at 10 pm.)  . losartan (COZAAR) 100 MG tablet Take 1 tablet (100 mg total) by mouth daily.  . Multiple Vitamins-Minerals (MULTIVITAMIN WITH MINERALS) tablet Take 1 tablet by mouth daily. Women's 50+  . naproxen sodium (ANAPROX) 220 MG tablet Take 440 mg by mouth 2 (two) times daily as needed (pain). 2 tablets every night at bedtime  . nitrofurantoin, macrocrystal-monohydrate, (MACROBID) 100 MG capsule Take 1 capsule (100 mg total) by mouth 2 (two) times daily for 5 days.  Letta Pate Delica Lancets 33G MISC Use as instructed to check blood sugars 2 times per day dx: e11.65  . OZEMPIC, 1 MG/DOSE, 2 MG/1.5ML SOPN INJECT INTO SKIN ONCE A WEEK (Patient taking differently: 1mg )  . simvastatin (ZOCOR) 10 MG tablet TAKE 1 TABLET DAILY  . SYNTHROID 25 MCG tablet Take 0.5 tablet (12.5 mcg) by mouth on Mondays through Fridays, then take 1 tablet (25 mcg) by mouth on Saturdays and Sundays.   No facility-administered encounter medications on file as of 02/16/2020.     Objective:  Lab Results  Component Value Date   HGBA1C 6.7 (H) 11/29/2019   HGBA1C 6.5 (H) 08/18/2019   HGBA1C 6.3 (H) 04/19/2019   Lab Results  Component Value Date   MICROALBUR 10 02/09/2020   LDLCALC 77 02/09/2020   CREATININE 0.96 11/29/2019   BP Readings from Last 3 Encounters:  02/14/20 110/75  02/09/20 122/80  01/06/20 (!) 149/69    Goals Addressed     Patient Care Plan: General Plan of Care (Adult)    Problem Identified: Quality of Life (General Plan of Care)     Long-Range Goal: Quality of Life Maintained   Start Date: 02/01/2020  Expected End Date: 05/01/2020  Recent Progress: On track  Priority: High  Note:   Current Barriers:   Ineffective Self Health  Maintenance  Currently UNABLE TO independently self manage needs related to chronic health conditions.   Knowledge Deficits related to short term plan for care coordination needs and long term plans for chronic disease management needs Nurse Case Manager Clinical Goal(s):   Over the next 90 days, patient will work with care management team to address care coordination and chronic disease management needs related to Disease Management  Educational Needs  Care Coordination  Medication Management and Education  Psychosocial Support   Interventions:  02/16/20 inbound call completed with patient   Determined patient provided a urine specimen to PCP  Determined patient was prescribed an antibiotic for treatment of possible UTI, a urine culture was ordered  Educated patient on importance of taking full course of antibiotics for best results  Educated patient on s/s suggestive of reoccurring UTI and when to call the doctor if needed  Reinforced ways to avoid UTI related to increasing water intake, using good perineal hygiene, wiping from front to back after urinating or having a bowel movement, urinating after sexual intercourse, avoiding bubble baths and or wearing thong underwear Patient Goals/Self Care/Activities:  Over the next 90 days, patient will  - discuss my treatment options with the doctor or nurse - learn something new by asking, reading and searching the Internet every day - make shared treatment decisions with doctor  - increase water intake to 64oz-80oz per day - complete full course of antibiotics as directed  - contact PCP for reoccurring symptoms   Follow Up Plan: Telephone follow up appointment with care management team member scheduled for: 04/13/20      Plan:   Telephone follow up appointment with care management team member scheduled for: 04/13/20  Barb Merino, RN, BSN, CCM Care Management Coordinator [place]   Direct Phone: [phone]

## 2020-02-16 NOTE — Patient Instructions (Signed)
Visit Information  Goals Addressed    . [place] My Quality of Life       Timeframe:  Long-Range Goal Priority:  High Start Date: 02/01/20                           Expected End Date: 05/01/20                    Follow Up Date 04/13/20 - discuss my treatment options with the doctor or nurse - learn something new by asking, reading and searching the Internet every day - make shared treatment decisions with doctor  - increase water intake to 64oz-80oz per day - complete full course of antibiotics as directed  - contact PCP for reoccurring symptoms   Why is this important?    Having a long-term illness can be scary.   It can also be stressful for you and your caregiver.   These steps may help.    Notes:        The patient verbalized understanding of instructions, educational materials, and care plan provided today and declined offer to receive copy of patient instructions, educational materials, and care plan.   Telephone follow up appointment with care management team member scheduled for: 04/13/20  Lynne Logan, RN

## 2020-02-20 ENCOUNTER — Encounter: Payer: Self-pay | Admitting: Internal Medicine

## 2020-02-20 ENCOUNTER — Ambulatory Visit: Payer: Medicare HMO

## 2020-02-20 DIAGNOSIS — N1831 Chronic kidney disease, stage 3a: Secondary | ICD-10-CM

## 2020-02-20 DIAGNOSIS — E1122 Type 2 diabetes mellitus with diabetic chronic kidney disease: Secondary | ICD-10-CM

## 2020-02-20 DIAGNOSIS — I129 Hypertensive chronic kidney disease with stage 1 through stage 4 chronic kidney disease, or unspecified chronic kidney disease: Secondary | ICD-10-CM

## 2020-02-20 NOTE — Chronic Care Management (AMB) (Signed)
Chronic Care Management    Social Work Follow Up Note  02/20/2020 Name: Teresa Golden MRN: [id number] DOB: 11/30/1945  Teresa Golden is a 74 y.o. year old female who is a primary care patient of Dorothyann Peng, MD. The CCM team was consulted for assistance with care coordination.   Review of patient status, including review of consultants reports, other relevant assessments, and collaboration with appropriate care team members and the patient's provider was performed as part of comprehensive patient evaluation and provision of chronic care management services.    SDOH (Social Determinants of Health) assessments performed: No    Outpatient Encounter Medications as of 02/20/2020  Medication Sig  . amLODipine (NORVASC) 5 MG tablet Take 1 tablet (5 mg total) by mouth daily.  . B Complex-C (B-COMPLEX WITH VITAMIN C) tablet Take 1 tablet by mouth daily.  . Biotin 95638 MCG TABS Take 1 tablet by mouth daily.  . carvedilol (COREG CR) 20 MG 24 hr capsule TAKE 1 CAPSULE DAILY  . Cholecalciferol 5000 units TABS Take 5,000 Units by mouth daily.  . Colchicine 0.6 MG CAPS TAKE 1 CAPSULE DAILY AS    NEEDED  . Cyanocobalamin (VITAMIN B-12) 2500 MCG SUBL Take 2,500 mcg by mouth daily.  . diclofenac Sodium (VOLTAREN) 1 % GEL APPLY 2 GRAMS TO AFFECTED AREA(S) 4 TIMES DAILY AS NEEDED  . docusate sodium (COLACE) 100 MG capsule Take 1 capsule (100 mg total) by mouth 2 (two) times daily. (Patient not taking: Reported on 02/09/2020)  . famotidine (PEPCID) 20 MG tablet Take 20 mg by mouth daily. PRN  . gabapentin (NEURONTIN) 300 MG capsule Take 600 mg by mouth at bedtime. Pt taking 600 MG at bedtime  . glucose blood (ONETOUCH VERIO) test strip Use as instructed to check blood sugars 2 times per day dx: e11.65  . insulin degludec (TRESIBA FLEXTOUCH) 100 UNIT/ML SOPN FlexTouch Pen Inject 0.09 mLs (9 Units total) into the skin daily at 10 pm. (Patient taking differently: Inject 8 Units into the skin daily at 10 pm.)   . losartan (COZAAR) 100 MG tablet Take 1 tablet (100 mg total) by mouth daily.  . Multiple Vitamins-Minerals (MULTIVITAMIN WITH MINERALS) tablet Take 1 tablet by mouth daily. Women's 50+  . naproxen sodium (ANAPROX) 220 MG tablet Take 440 mg by mouth 2 (two) times daily as needed (pain). 2 tablets every night at bedtime  . OneTouch Delica Lancets 33G MISC Use as instructed to check blood sugars 2 times per day dx: e11.65  . OZEMPIC, 1 MG/DOSE, 2 MG/1.5ML SOPN INJECT INTO SKIN ONCE A WEEK (Patient taking differently: 1mg )  . simvastatin (ZOCOR) 10 MG tablet TAKE 1 TABLET DAILY  . SYNTHROID 25 MCG tablet Take 0.5 tablet (12.5 mcg) by mouth on Mondays through Fridays, then take 1 tablet (25 mcg) by mouth on Saturdays and Sundays.   No facility-administered encounter medications on file as of 02/20/2020.       Patient Care Plan: Social Work Care Plan    Problem Identified: Care Coordination     Long-Range Goal: Identify resources to assist with vehicle adaptations   Start Date: 02/09/2020  Expected End Date: 05/09/2020  Recent Progress: On track  Priority: Medium  Note:   Current Barriers:  . Transportation- unable to transport self due to barriers surrounding use of foot pedals . Limited knowledge of resources to assist with vehicle adaptations . Chronic conditions including DM II, CKD III, and HTN which put patient at increased risk of hospitalization  Social  Work Clinical Goal(s):  [name] Over the next 120 days the patient will work with SW to identify resources to assist with desired vehicle adaptions  Interventions: . 1:1 collaboration with Glendale Chard, MD regarding development and update of comprehensive plan of care as evidenced by provider attestation and co-signature . Inter-disciplinary care team collaboration (see longitudinal plan of care) . Second unsuccessful outbound call placed to [facility] - voice message left requesting a return call . Collaboration with  [facility] to request resource information for vehicle adaptations  Patient Goals/Self-Care Activities Over the next 45 days, patient will:   - Patient will self administer medications as prescribed Patient will attend all scheduled provider appointments Patient will call provider office for new concerns or questions Contact SW as needed prior to next scheduled call  Follow up Plan: SW will follow up with patient by phone over the next month        Follow Up Plan: SW will follow up with patient by phone over the next two weeks   Daneen Schick, BSW, CDP Social Worker, Certified Dementia Practitioner [place] [facility] [phone]  Total time spent performing care coordination and/or care management activities with the patient by phone or face to face = 5 minutes.

## 2020-02-20 NOTE — Patient Instructions (Signed)
  Goals Addressed            This Visit's Progress   . Work with SW to manage care coordination needs       Timeframe:  Long-Range Goal Priority:  [employer] Start Date:   12.16.21                          Expected End Date:   3.16.22                    Next date of contact: 1.7.22  Patient Goals/Self-Care Activities Over the next 45 days, patient will:   - Patient will self administer medications as prescribed -Patient will attend all scheduled provider appointments -Patient will call provider office for new concerns or questions -Contact SW as needed prior to next scheduled call

## 2020-02-21 ENCOUNTER — Telehealth: Payer: Self-pay

## 2020-02-21 NOTE — Chronic Care Management (AMB) (Signed)
Chronic Care Management Pharmacy Assistant   Name: Teresa Golden  MRN: [id number] DOB: 05/25/1945  Reason for Encounter: Hypertension, Hyperlipidemia, and Diabetes Adherence Call.  Patient Questions:  1.  Have you seen any other providers since your last visit? Yes, 02/13/20- Little, Karma LewAngel L, RN (CCM), 02/16/20- Little, Karma LewAngel L, RN (CCM)  2.  Any changes in your medicines or health? Yes, 02/14/20- Macrobid 100 MG (ordered)    PCP : Dorothyann PengSanders, Robyn, MD  Allergies:   Allergies  Allergen Reactions  . Meloxicam Other (See Comments)    Fever; muscle aches; "flu-like" symptoms  . Other     Rose fever and hay fever     Medications: Outpatient Encounter Medications as of 02/21/2020  Medication Sig  . amLODipine (NORVASC) 5 MG tablet Take 1 tablet (5 mg total) by mouth daily.  . B Complex-C (B-COMPLEX WITH VITAMIN C) tablet Take 1 tablet by mouth daily.  . Biotin [id number] MCG TABS Take 1 tablet by mouth daily.  . carvedilol (COREG CR) 20 MG 24 hr capsule TAKE 1 CAPSULE DAILY  . Cholecalciferol 5000 units TABS Take 5,000 Units by mouth daily.  . Colchicine 0.6 MG CAPS TAKE 1 CAPSULE DAILY AS    NEEDED  . Cyanocobalamin (VITAMIN B-12) 2500 MCG SUBL Take 2,500 mcg by mouth daily.  . diclofenac Sodium (VOLTAREN) 1 % GEL APPLY 2 GRAMS TO AFFECTED AREA(S) 4 TIMES DAILY AS NEEDED  . docusate sodium (COLACE) 100 MG capsule Take 1 capsule (100 mg total) by mouth 2 (two) times daily. (Patient not taking: Reported on 02/09/2020)  . famotidine (PEPCID) 20 MG tablet Take 20 mg by mouth daily. PRN  . gabapentin (NEURONTIN) 300 MG capsule Take 600 mg by mouth at bedtime. Pt taking 600 MG at bedtime  . glucose blood (ONETOUCH VERIO) test strip Use as instructed to check blood sugars 2 times per day dx: e11.65  . insulin degludec (TRESIBA FLEXTOUCH) 100 UNIT/ML SOPN FlexTouch Pen Inject 0.09 mLs (9 Units total) into the skin daily at 10 pm. (Patient taking differently: Inject 8 Units into the skin daily  at 10 pm.)  . losartan (COZAAR) 100 MG tablet Take 1 tablet (100 mg total) by mouth daily.  . Multiple Vitamins-Minerals (MULTIVITAMIN WITH MINERALS) tablet Take 1 tablet by mouth daily. Women's 50+  . naproxen sodium (ANAPROX) 220 MG tablet Take 440 mg by mouth 2 (two) times daily as needed (pain). 2 tablets every night at bedtime  . OneTouch Delica Lancets 33G MISC Use as instructed to check blood sugars 2 times per day dx: e11.65  . OZEMPIC, 1 MG/DOSE, 2 MG/1.5ML SOPN INJECT INTO SKIN ONCE A WEEK (Patient taking differently: 1mg )  . simvastatin (ZOCOR) 10 MG tablet TAKE 1 TABLET DAILY  . SYNTHROID 25 MCG tablet Take 0.5 tablet (12.5 mcg) by mouth on Mondays through Fridays, then take 1 tablet (25 mcg) by mouth on Saturdays and Sundays.   No facility-administered encounter medications on file as of 02/21/2020.    Current Diagnosis: Patient Active Problem List   Diagnosis Date Noted  . Type 2 diabetes mellitus with stage 3 chronic kidney disease, with long-term current use of insulin (HCC) 11/29/2019  . Panniculitis 09/30/2019  . Back pain 09/30/2019  . Hepatoma (HCC) 01/21/2019  . Other cirrhosis of liver (HCC) 07/05/2018  . Hypertensive nephropathy 07/05/2018  . Chronic renal disease, stage III (HCC) 07/05/2018  . Chronic left shoulder pain 07/05/2018  . Hypothyroidism 02/06/2018  . Hepatitis C virus infection cured  after antiviral drug therapy 02/04/2018  . Osteoarthritis of right knee 07/30/2017  . S/P laparoscopic sleeve gastrectomy July 2018 08/26/2016  . Preop cardiovascular exam 07/30/2016  . Dyspnea on exertion 07/30/2016  . Class 2 severe obesity due to excess calories with serious comorbidity and body mass index (BMI) of 39.0 to 39.9 in adult (HCC) 07/30/2016  . Bilateral lower extremity edema 07/30/2016   Recent Relevant Labs: Lab Results  Component Value Date/Time   HGBA1C 6.7 (H) 11/29/2019 02:11 PM   HGBA1C 6.5 (H) 08/18/2019 02:10 PM   MICROALBUR 10 02/09/2020  10:59 AM   MICROALBUR 10 01/17/2019 06:09 PM    Kidney Function Lab Results  Component Value Date/Time   CREATININE 0.96 11/29/2019 02:11 PM   CREATININE 0.83 08/18/2019 02:10 PM   GFRNONAA 58 (L) 11/29/2019 02:11 PM   GFRAA 67 11/29/2019 02:11 PM    . Current antihyperglycemic regimen:  o Tresiba 100 units/ml 8 units daily o Ozempic 1mg  weekly  . What recent interventions/DTPs have been made to improve glycemic control:  o Patient reports she is taking diabetes medication as prescribed and checking blood sugars twice daily.  . Have there been any recent hospitalizations or ED visits since last visit with CPP? No   . Patient denies hypoglycemic symptoms, including Pale, Sweaty, Shaky, Hungry, Nervous/irritable and Vision changes   . Patient reports hyperglycemic symptoms, including blurry vision and weakness   . How often are you checking your blood sugar? twice daily, in the morning before eating or drinking and before meals   . What are your blood sugars ranging?  o Fasting: 12/29; 96 morning o Before meals: none o After meals: none o Bedtime: 12/28; 157 night . During the week, how often does your blood glucose drop below 70? Once a month.   . Are you checking your feet daily/regularly? Patient states she checks her feet daily; nothing unusual to report.   Adherence Review: Is the patient currently on a STATIN medication? Yes   Is the patient currently on ACE/ARB medication? Yes   Does the patient have >5 day gap between last estimated fill dates? Yes   Reviewed chart prior to disease state call. Spoke with patient regarding BP  Recent Office Vitals: BP Readings from Last 3 Encounters:  02/14/20 110/75  02/09/20 122/80  01/06/20 (!) 149/69   Pulse Readings from Last 3 Encounters:  02/14/20 98  02/09/20 67  01/06/20 64    Wt Readings from Last 3 Encounters:  02/14/20 212 lb 9.6 oz (96.4 kg)  02/09/20 212 lb (96.2 kg)  01/06/20 216 lb (98 kg)     Kidney  Function Lab Results  Component Value Date/Time   CREATININE 0.96 11/29/2019 02:11 PM   CREATININE 0.83 08/18/2019 02:10 PM   GFRNONAA 58 (L) 11/29/2019 02:11 PM   GFRAA 67 11/29/2019 02:11 PM    BMP Latest Ref Rng & Units 11/29/2019 08/18/2019 04/19/2019  Glucose 65 - 99 mg/dL 04/21/2019) 144(R) 154(M)  BUN 8 - 27 mg/dL 18 12 15   Creatinine 0.57 - 1.00 mg/dL 086(P 6.19)  BUN/Creat Ratio 12 - 28 19 14 14   Sodium 134 - 144 mmol/L 145(H) 140 144  Potassium 3.5 - 5.2 mmol/L 4.2 4.0 4.1  Chloride 96 - 106 mmol/L 105 101 106  CO2 20 - 29 mmol/L 23 26 23   Calcium 8.7 - 10.3 mg/dL 9.6 9.6 9.4    . Current antihypertensive regimen:  o Carvedilol CR 20mg  daily o Losartan 50mg  twice daily o  Amlodipine 5 mg daily  . How often are you checking your Blood Pressure? twice daily   . Current home BP readings: 02/22/20; 147/70.  [name] What recent interventions/DTPs have been made by any provider to improve Blood Pressure control since last CPP Visit: Recent Interventions was to increase exercise to 30 minutes daily 5 times per week, limit salt intake, start checking blood pressure daily.  . Any recent hospitalizations or ED visits since last visit with CPP? No   . What diet changes have been made to improve Blood Pressure Control?  o Patient reports she eats chips (lightly salted), cooks fish without adding salt seasoning (adds hot sauce to replace the salt seasoning), a slice of bacon on toast and peanut butter, coffee in the morning, greek yogurt or any yogurt with high protein.   . What exercise is being done to improve your Blood Pressure Control?  o Patient reports staying active and does physical therapy.   Adherence Review: Is the patient currently on ACE/ARB medication? Yes Does the patient have >5 day gap between last estimated fill dates? Yes   02/22/2020 Name: Teresa Golden MRN: [id number] DOB: 09-05-1945 Teresa Golden is a 74 y.o. year old female who is a primary care patient of  Glendale Chard, MD.  Comprehensive medication review performed; Spoke to patient regarding cholesterol  Lipid Panel    Component Value Date/Time   CHOL 150 02/09/2020 1443   TRIG 55 02/09/2020 1443   HDL 61 02/09/2020 1443   LDLCALC 77 02/09/2020 1443    10-year ASCVD risk score: The 10-year ASCVD risk score Mikey Bussing DC Brooke Bonito., et al., 2013) is: 22.2%   Values used to calculate the score:     Age: 35 years     Sex: Female     Is Non-Hispanic African American: Yes     Diabetic: Yes     Tobacco smoker: No     Systolic Blood Pressure: [id number] mmHg     Is BP treated: Yes     HDL Cholesterol: 61 mg/dL     Total Cholesterol: 150 mg/dL  . Current antihyperlipidemic regimen:  o Simvastatin 10mg  daily  . Previous antihyperlipidemic medications tried: None  . ASCVD risk enhancing conditions: age >44, DM, HTN, CKD and current smoker   . What recent interventions/DTPs have been made by any provider to improve Cholesterol control since last CPP Visit: Recent Interventions was to increase water intake to 64 ounces daily. Patient reports she takes her blood pressure medication as requested.   . Any recent hospitalizations or ED visits since last visit with CPP? No   . What diet changes have been made to improve Cholesterol?   Patient reports she eats chips (lightly salted), cooks fish without adding salt seasoning (adds  hot sauce to replace the salt seasoning), a slice of bacon on toast and peanut butter, coffee  in the morning, greek yogurt or any yogurt with high protein.   . What exercise is being done to improve Cholesterol?  o Patient reports staying active and does physical therapy.   Adherence Review: Does the patient have >5 day gap between last estimated fill dates? Yes    Goals Addressed            This Visit's Progress   . Pharmacy Care Plan   Not on track    [place] (see longitudinal plan of care for additional care plan information)  Current Barriers:  . Chronic  Disease Management support, education, and  care coordination needs related to Hypertension, Diabetes, and Hypothyroidism   Hypertension BP Readings from Last 3 Encounters:  09/30/19 (!) 150/82  09/06/19 132/70  08/18/19 (!) 144/82   . Pharmacist Clinical Goal(s): o Over the next 90 days, patient will work with PharmD and providers to achieve BP goal <130/80 . Current regimen:   Carvedilol CR 20mg  daily  Losartan 50mg  twice daily  Amlodipine 5mg  daily . Interventions: o Recommend patient increase exercise to 30 minutes daily 5 times per week o Recommend patient limit salt intake o Advised patient to start checking blood pressure daily o Patient mentions she is almost out of amlodipine - Advised patient she has 1 refill left of amlodipine available at [facility] . Patient self care activities - Over the next 90 days, patient will: o Check BP daily, document, and provide at future appointments o Ensure daily salt intake < 2300 mg/day o Exercise 30 minutes per day, 5 days per week o Call in refill for amlodipine to [facility]  Hyperlipidemia Lab Results  Component Value Date/Time   LDLCALC 82 01/17/2019 11:56 AM   . Pharmacist Clinical Goal(s): o Over the next 90 days, patient will work with PharmD and providers to achieve LDL goal < 70 . Current regimen:  o Simvastatin 10mg  daily (Monday through Friday) . Interventions: o Recommend lipid panel at next PCP appointment o Discussed appropriate goal for LDL (less than 70) . Patient self care activities - Over the next 90 days, patient will: o Take cholesterol medication as directed o Exercise 30 minutes daily, 5 times per week o Limit fried and fatty foods  Diabetes Lab Results  Component Value Date/Time   HGBA1C 6.5 (H) 08/18/2019 02:10 PM   HGBA1C 6.3 (H) 04/19/2019 12:12 PM   . Pharmacist Clinical Goal(s): o Over the next 90 days, patient will work with PharmD and providers to maintain A1c goal  <7% . Current regimen:   Tresiba 100 units/mL 8 units daily   Ozempic 1mg  weekly . Interventions: o Discussed appropriate blood sugar goals (less than 130 if fasting, less than 180 if 2 hours after eating) o Determined patient has received enough Ozempic to last until the end of the year o Discussed eligibility criteria for 2022 Ozempic patient assistance program and when patient can apply - Will determine criteria and notify patient when she can reapply o Provided dietary and exercise recommendations o Recommend patient increase water intake to 64 ounces daily . Patient self care activities - Over the next 90 days, patient will: o Check blood sugar twice daily, document, and provide at future appointments o Contact provider with any episodes of hypoglycemia o Exercise 30 minutes daily 5 times per week o Cut back on carbohydrates and sweets o Increase water intake (goal of 64 ounces daily)  Medication management . Pharmacist Clinical Goal(s): o Over the next 90 days, patient will work with PharmD and providers to maintain optimal medication adherence . Current pharmacy: [facility] . Interventions o Comprehensive medication review performed. o Performed medication cost review comparing [facility] and [facility], but unable to find patient's exact plan on medicare.gov o Continue current medication management strategy . Patient self care activities - Over the next 90 days, patient will: o Focus on medication adherence by utilization of pill box o Take medications as prescribed o Report any questions or concerns to PharmD and/or provider(s)  Please see past updates related to this goal by clicking on the "Past Updates" button in the  selected goal        02/21/20- Called the patient to speak with her regarding her hypertension, hyperlipidemia, and diabetes monitoring. The patient requested a call back tomorrow 02/22/20 to discuss.   02/22/20- Unsuccessful outreach to  the patient regarding her hypertension, hyperlipidemia, and blood sugar monitoring. Left a HIPAA compliant voicemail for a call back.    Follow-Up:  Pharmacist Review-Patient returned call; expressed that she was very upset and anxious regarding a UA that was collected back on 02/14/20 and has yet to receive any results from the office ([facility]). Apologized to the patient for the reason of call and reassured the patient I will forward this concern to my PTM [name] so that she may reach out to the nurse of Dr. Baird Cancer. Patient verbalized understanding. Patient informed me she is scheduled to have Panniculectomy Surgery on May 28, 2020. Notified [name], CPP.   Raynelle Highland, [place] Pharmacist Assistant [phone]

## 2020-02-22 ENCOUNTER — Ambulatory Visit (INDEPENDENT_AMBULATORY_CARE_PROVIDER_SITE_OTHER): Payer: Medicare HMO | Admitting: Addiction (Substance Use Disorder)

## 2020-02-22 ENCOUNTER — Other Ambulatory Visit: Payer: Self-pay

## 2020-02-22 ENCOUNTER — Encounter: Payer: Self-pay | Admitting: Addiction (Substance Use Disorder)

## 2020-02-22 ENCOUNTER — Encounter: Payer: Self-pay | Admitting: Internal Medicine

## 2020-02-22 DIAGNOSIS — F4323 Adjustment disorder with mixed anxiety and depressed mood: Secondary | ICD-10-CM

## 2020-02-22 NOTE — Progress Notes (Signed)
      [facility] Counselor/Therapist Progress Note  [name], MRN: [id number],    Date: 02/22/2020  Time Spent:  Treatment Type: Individual Therapy  Reported Symptoms: excited but also struggling.  Mental Status Exam:  Appearance:   Neat     Behavior:  Appropriate and Sharing  Motor:  Normal  Speech/Language:   Clear and Coherent  Affect:  Appropriate  Mood:  sad  Thought process:  normal  Thought content:    WNL  Sensory/Perceptual disturbances:    WNL  Orientation:  x4  Attention:  Good  Concentration:  Good  Memory:  WNL  Fund of knowledge:   Good  Insight:    Good  Judgment:   Good  Impulse Control:  Fair   Risk Assessment: Danger to Self:  No Self-injurious Behavior: No Danger to Others: No Duty to Warn:no Physical Aggression / Violence:No  Access to Firearms a concern: No  Gang Involvement:No   Subjective: Client reported feeling blessed and excited about a bunch of things lately that are going for her. Client reported being approved for a skin reduction surgery and being thankful because its causing her health issues along with hurting her self-esteem. Client processed the blessings of events on Christmas day with people from her church and sees God as blessing her obedience to be a part of his children's community. Client processed how toxic the relationship with her ex bf has gotten with her being his "side chick". Therapist used MI to listen, support client and validate her struggles while also using 12 steps with client to help her find ways to address her cravings and surrender her need for his affection. Client reported making progress in that by also ignoring his texts and not calling him anymore and instead "going to God in prayer".  Interventions: Motivational Interviewing and 12-Step  Diagnosis:   ICD-10-CM   1. Adjustment disorder with mixed anxiety and depressed mood  F43.23     Plan of Care:  Client is to return to  therapy with therapist every 1-2 weeks as needed to process traumas/ frustrations in a safe space, to be re-evaluated in 3 months.  Client is to practice mindfulness AEB daily meditation and body scans or as needed when flooded by emotion/pain.  Client is to learn/practice DBT wise mind & radical acceptance. Client is to practice self-compassion AEB being gentle with themselves, utilizing self-care techniques daily or as needed when grieving something in the moment.  Client is to process grief/pain of their loss in a somatic body-felt sense way: ie using mindfulness, brainspotting, or trauma release as a method for releasing body pain/tension caused by grief.  Pauline Good, LCSW, LCAS, CCTP, CCS-I, BSP

## 2020-02-23 LAB — URINE CULTURE

## 2020-02-24 ENCOUNTER — Other Ambulatory Visit: Payer: Self-pay | Admitting: Internal Medicine

## 2020-02-27 ENCOUNTER — Telehealth: Payer: Self-pay

## 2020-02-27 NOTE — Telephone Encounter (Cosign Needed)
  Chronic Care Management   Outreach Note  02/27/2020 Name: Teresa Golden MRN: [id number] DOB: [birth year]  Referred by: Dorothyann Peng, MD Reason for referral : Chronic Care Management (Inbound Call from patient )   Teresa Golden is enrolled in a Managed Medicaid Health Plan: No  Voice message received from patient requesting a return phone call to go over her urine culture results. She missed a call from someone earlier due to having home phone line issues. Sent in basket message to Mariam Dollar CMA requesting outreach to [name] on her cell phone number [phone]  to discuss her Urine Culture results per patient's request. The patient was referred to the case management team for assistance with care management and care coordination.   Follow Up Plan: Telephone follow up appointment with care management team member scheduled for: 03/02/20  Delsa Sale, RN, BSN, CCM Care Management Coordinator [facility] Care Management/[facility]  Direct Phone: [phone]

## 2020-02-28 ENCOUNTER — Telehealth: Payer: Self-pay

## 2020-02-28 NOTE — Telephone Encounter (Signed)
Left the patient a message to call back for lab results. 

## 2020-02-29 ENCOUNTER — Other Ambulatory Visit: Payer: Self-pay

## 2020-02-29 ENCOUNTER — Telehealth: Payer: Self-pay

## 2020-02-29 ENCOUNTER — Ambulatory Visit: Payer: Self-pay

## 2020-02-29 DIAGNOSIS — F419 Anxiety disorder, unspecified: Secondary | ICD-10-CM

## 2020-02-29 DIAGNOSIS — N1831 Chronic kidney disease, stage 3a: Secondary | ICD-10-CM

## 2020-02-29 DIAGNOSIS — N183 Chronic kidney disease, stage 3 unspecified: Secondary | ICD-10-CM

## 2020-02-29 DIAGNOSIS — E1122 Type 2 diabetes mellitus with diabetic chronic kidney disease: Secondary | ICD-10-CM

## 2020-02-29 DIAGNOSIS — I129 Hypertensive chronic kidney disease with stage 1 through stage 4 chronic kidney disease, or unspecified chronic kidney disease: Secondary | ICD-10-CM

## 2020-02-29 NOTE — Telephone Encounter (Signed)
The pt was given her urine culture results. The pt wants Dr. Allyne Gee to know that she got a letter about her carvedilol extended release is going up in price to $130 for a 90 days supply and that she could get the regular carvedilol cheaper.  The pt wants to know if Dr. Allyne Gee thinks it's ok to change the medication to regular carvedilol.

## 2020-02-29 NOTE — Telephone Encounter (Signed)
Ms Savo said that she will give [facility] a call.

## 2020-02-29 NOTE — Telephone Encounter (Signed)
She should speak to [facility], she may be able to get a lower price

## 2020-03-02 ENCOUNTER — Telehealth: Payer: Self-pay

## 2020-03-02 ENCOUNTER — Ambulatory Visit: Payer: Self-pay

## 2020-03-02 DIAGNOSIS — E1122 Type 2 diabetes mellitus with diabetic chronic kidney disease: Secondary | ICD-10-CM

## 2020-03-02 DIAGNOSIS — N1831 Chronic kidney disease, stage 3a: Secondary | ICD-10-CM

## 2020-03-02 DIAGNOSIS — I129 Hypertensive chronic kidney disease with stage 1 through stage 4 chronic kidney disease, or unspecified chronic kidney disease: Secondary | ICD-10-CM

## 2020-03-02 DIAGNOSIS — N183 Chronic kidney disease, stage 3 unspecified: Secondary | ICD-10-CM

## 2020-03-02 NOTE — Chronic Care Management (AMB) (Signed)
Chronic Care Management    Social Work Follow Up Note  03/02/2020 Name: Teresa Golden MRN: [id number] DOB: 03-18-1945  Teresa Golden is a 75 y.o. year old female who is a primary care patient of Glendale Chard, MD. The CCM team was consulted for assistance with care coordination.   Review of patient status, including review of consultants reports, other relevant assessments, and collaboration with appropriate care team members and the patient's provider was performed as part of comprehensive patient evaluation and provision of chronic care management services.    SDOH (Social Determinants of Health) assessments performed: No    Outpatient Encounter Medications as of 03/02/2020  Medication Sig  . amLODipine (NORVASC) 5 MG tablet Take 1 tablet (5 mg total) by mouth daily.  . B Complex-C (B-COMPLEX WITH VITAMIN C) tablet Take 1 tablet by mouth daily.  . Biotin 10000 MCG TABS Take 1 tablet by mouth daily.  . carvedilol (COREG CR) 20 MG 24 hr capsule TAKE 1 CAPSULE DAILY  . Cholecalciferol 5000 units TABS Take 5,000 Units by mouth daily.  . Colchicine 0.6 MG CAPS TAKE 1 CAPSULE DAILY AS    NEEDED  . Cyanocobalamin (VITAMIN B-12) 2500 MCG SUBL Take 2,500 mcg by mouth daily.  . diclofenac Sodium (VOLTAREN) 1 % GEL APPLY 2 GRAMS TO AFFECTED AREA(S) 4 TIMES DAILY AS NEEDED  . docusate sodium (COLACE) 100 MG capsule Take 1 capsule (100 mg total) by mouth 2 (two) times daily. (Patient not taking: Reported on 02/09/2020)  . famotidine (PEPCID) 20 MG tablet Take 20 mg by mouth daily. PRN  . gabapentin (NEURONTIN) 300 MG capsule Take 600 mg by mouth at bedtime. Pt taking 600 MG at bedtime  . glucose blood (ONETOUCH VERIO) test strip Use as instructed to check blood sugars 2 times per day dx: e11.65  . insulin degludec (TRESIBA FLEXTOUCH) 100 UNIT/ML SOPN FlexTouch Pen Inject 0.09 mLs (9 Units total) into the skin daily at 10 pm. (Patient taking differently: Inject 8 Units into the skin daily at 10 pm.)  .  losartan (COZAAR) 100 MG tablet Take 1 tablet (100 mg total) by mouth daily.  . Multiple Vitamins-Minerals (MULTIVITAMIN WITH MINERALS) tablet Take 1 tablet by mouth daily. Women's 50+  . naproxen sodium (ANAPROX) 220 MG tablet Take 440 mg by mouth 2 (two) times daily as needed (pain). 2 tablets every night at bedtime  . OneTouch Delica Lancets 93J MISC Use as instructed to check blood sugars 2 times per day dx: e11.65  . OZEMPIC, 1 MG/DOSE, 2 MG/1.5ML SOPN INJECT INTO SKIN ONCE A WEEK (Patient taking differently: 24m)  . simvastatin (ZOCOR) 10 MG tablet TAKE 1 TABLET DAILY  . SYNTHROID 25 MCG tablet Take 0.5 tablet (12.5 mcg) by mouth on Mondays through Fridays, then take 1 tablet (25 mcg) by mouth on Saturdays and Sundays.   No facility-administered encounter medications on file as of 03/02/2020.     Patient Care Plan: Diabetes Type 2 (Adult)    Problem Identified: Glycemic Management (Diabetes, Type 2)   Priority: High    Long-Range Goal: Glycemic Management Optimized   Start Date: 02/01/2020  Expected End Date: 05/01/2020  This Visit's Progress: On track  Priority: High  Note:   Objective:  Lab Results  Component Value Date   HGBA1C 6.7 (H) 11/29/2019 .   Lab Results  Component Value Date   CREATININE 0.96 11/29/2019   CREATININE 0.83 08/18/2019   CREATININE 1.09 (H) 04/19/2019 .   .Marland KitchenNo results found  for: EGFR Current Barriers:  [name] Knowledge Deficits related to basic Diabetes pathophysiology and self care/management Case Manager Clinical Goal(s):  [name] Over the next 90 days, patient will demonstrate improved adherence to prescribed treatment plan for diabetes self care/management as evidenced by:  . daily monitoring and recording of CBG  . adherence to ADA/ carb modified diet . exercise 3-5 days/week . adherence to prescribed medication regimen Interventions:  . Reviewed medications with patient and discussed importance of medication adherence . Discussed plans with patient for  ongoing care management follow up and provided patient with direct contact information for care management team . Advised patient, providing education and rationale, to check cbg daily before meals and record, calling the CCM team and or PCP for findings outside established parameters.   . Review of patient status, including review of consultants reports, relevant laboratory and other test results, and medications completed.  Anticipate A1C testing (point-of-care) every 3 to 6 months based on goal attainment.   Review mutually-set A1C goal or target range.   Anticipate use of antihyperglycemic with or without insulin and periodic adjustments; consider active involvement of pharmacist.   Compare self-reported symptoms of hypo or hyperglycemia to blood glucose levels, diet and fluid intake, current medications, psychosocial and physiologic stressors, change in activity and barriers to care adherence.  Patient Goals/Self-Care Activities . Over the next 90 days, patient will:  - Self administers oral medications as prescribed Self administers insulin as prescribed Self administers injectable DM medication (Ozempic, [name]) as prescribed Attends all scheduled provider appointments Checks blood sugars as prescribed and utilize hyper and hypoglycemia protocol as needed Adheres to prescribed ADA/carb modified - Perform daily foot checks - Schedule eye and dental exams - enter blood sugar readings and medication or insulin into daily log - take the blood sugar log to all doctor visits - take the blood sugar meter to all doctor visits    Follow Up Plan: Telephone follow up appointment with care management team member scheduled for: 02/10/20     Problem Identified: Disease Progression (Diabetes, Type 2)   Priority: High    Long-Range Goal: Disease Progression Prevented or Minimized   Start Date: 02/01/2020  Expected End Date: 05/01/2020  This Visit's Progress: On track  Priority: High  Note:    Objective:  Lab Results  Component Value Date   HGBA1C 6.7 (H) 11/29/2019 .   Lab Results  Component Value Date   CREATININE 0.96 11/29/2019   CREATININE 0.83 08/18/2019   CREATININE 1.09 (H) 04/19/2019 .   [name] No results found for: EGFR Current Barriers:  [name] Knowledge Deficits related to basic Diabetes pathophysiology and self care/management Case Manager Clinical Goal(s):  [name] Over the next 90 days, patient will demonstrate improved adherence to prescribed treatment plan for diabetes self care/management as evidenced by:  . daily monitoring and recording of CBG  . adherence to ADA/ carb modified diet . exercise 3-5 days/week . adherence to prescribed medication regimen Interventions:   Ensure completion of annual comprehensive foot exam and dilated eye exam.    Implement additional individualized goals and interventions based on identified risk factors.   Encourage lifestyle changes, such as increased intake of plant-based foods, stress reduction, consistent physical activity and smoking cessation to prevent long-term complications and chronic disease.    Individualize activity and exercise recommendations while considering potential limitations, such as neuropathy, retinopathy or the ability to prevent hyperglycemia or hypoglycemia.   . Provided education to patient about basic DM disease process . Discussed  plans with patient for ongoing care management follow up and provided patient with direct contact information for care management team . Advised patient, providing education and rationale, to check cbg 1-2 times daily before meals and record, calling the CCM team and or PCP for findings outside established parameters.   Patient Goals/Self-Care Activities . Over the next 90 days, patient will:  - Self administers oral medications as prescribed Self administers insulin as prescribed Self administers injectable DM medication (Ozempic, [name] ) as prescribed Attends all  scheduled provider appointments Checks blood sugars as prescribed and utilize hyper and hypoglycemia protocol as needed Adheres to prescribed ADA/carb modified - set target A1C Follow Up Plan: Telephone follow up appointment with care management team member scheduled for: 02/10/20     Patient Care Plan: Hypertension (Adult)    Problem Identified: Hypertension (Hypertension)   Priority: High    Long-Range Goal: Hypertension Monitored   Start Date: 02/01/2020  Expected End Date: 05/01/2020  This Visit's Progress: On track  Priority: High  Note:   Objective:  . Last practice recorded BP readings:  BP Readings from Last 3 Encounters:  01/06/20 (!) 149/69  11/29/19 130/68  11/29/19 130/68 .   [name] Most recent eGFR/CrCl: No results found for: EGFR  No components found for: CRCL Current Barriers:  [name] Knowledge Deficits related to basic understanding of hypertension pathophysiology and self care management Case Manager Clinical Goal(s):  [name] Over the next 90 days, patient will verbalize understanding of plan for hypertension management Interventions:  . Evaluation of current treatment plan related to hypertension self management and patient's adherence to plan as established by provider. . Provided education to patient re: stroke prevention, s/s of heart attack and stroke, DASH diet, complications of uncontrolled blood pressure . Reviewed medications with patient and discussed importance of compliance . Discussed plans with patient for ongoing care management follow up and provided patient with direct contact information for care management team . Advised patient, providing education and rationale, to monitor blood pressure daily and record, calling PCP for findings outside established parameters.  . Reviewed scheduled/upcoming provider appointments including: PCP OV 02/09/20 _0 :45 am w/Dr. Baird [facility]   Encourage continued use of home blood pressure monitoring and recording in blood pressure log;  include symptoms of hypotension or potential medication side effects in log.   Review blood pressure measurements taken inside and outside of the provider office; establish baseline and monitor trends; compare to target ranges or patient goal.  Patient Goals/Self-Care Activities . Over the next 90 days, patient will:  - Self administers medications as prescribed Attends all scheduled provider appointments Calls provider office for new concerns, questions, or BP outside discussed parameters Checks BP and records as discussed Follows a low sodium diet/DASH diet - check blood pressure 3 times per week - write blood pressure results in a log or diary  Follow Up Plan: Telephone follow up appointment with care management team member scheduled for: 02/10/20     Problem Identified: Disease Progression (Hypertension)   Priority: High    Goal: Disease Progression Prevented or Minimized   Start Date: 02/01/2020  Expected End Date: 05/01/2020  This Visit's Progress: On track  Priority: High  Note:   Objective:  . Last practice recorded BP readings:  BP Readings from Last 3 Encounters:  01/06/20 (!) 149/69  11/29/19 130/68  11/29/19 130/68 .   [name] Most recent eGFR/CrCl: No results found for: EGFR  No components found for: CRCL Current Barriers:  [name] Knowledge Deficits related to  basic understanding of hypertension pathophysiology and self care management Case Manager Clinical Goal(s):  [name] Over the next 90 days, patient will demonstrate improved adherence to prescribed treatment plan for hypertension as evidenced by taking all medications as prescribed, monitoring and recording blood pressure as directed, adhering to low sodium/DASH diet Interventions:  . Evaluation of current treatment plan related to hypertension self management and patient's adherence to plan as established by provider. . Provided education to patient re: stroke prevention, s/s of heart attack and stroke, DASH diet, complications  of uncontrolled blood pressure . Reviewed medications with patient and discussed importance of compliance . Discussed plans with patient for ongoing care management follow up and provided patient with direct contact information for care management team . Advised patient, providing education and rationale, to monitor blood pressure daily and record, calling PCP for findings outside established parameters.   Promote a healthy diet that includes primarily plant-based foods, such as fruits, vegetables, whole grains, beans and legumes, low-fat dairy and lean meats.    Consider moderate reduction in sodium intake by avoiding the addition of salt to prepared foods and limiting processed meats, canned soup, frozen meals and salty snacks.    Promote a regular, daily exercise goal of 150 minutes per week of moderate exercise based on tolerance, ability and patient choice; consider referral to physical therapist, community wellness and/or activity program.   Review sources of stress; explore current coping strategies and encourage use of mindfulness, yoga, meditation or exercise to manage stress.   Patient Goals/Self-Care Activities . Over the next 90 days, patient will:  - Self administers medications as prescribed Attends all scheduled provider appointments Calls provider office for new concerns, questions, or BP outside discussed parameters Checks BP and records as discussed Follows a low sodium diet/DASH diet - agree to work together to make changes - ask questions to understand - learn about high blood pressure Follow Up Plan: Telephone follow up appointment with care management team member scheduled for: 02/10/20     Patient Care Plan: General Plan of Care (Adult)    Problem Identified: Quality of Life (General Plan of Care)     Long-Range Goal: Quality of Life Maintained   Start Date: 02/01/2020  Expected End Date: 05/01/2020  Recent Progress: On track  Priority: High  Note:   Current  Barriers:   Ineffective Self Health Maintenance  Currently UNABLE TO independently self manage needs related to chronic health conditions.   Knowledge Deficits related to short term plan for care coordination needs and long term plans for chronic disease management needs Nurse Case Manager Clinical Goal(s):   Over the next 90 days, patient will work with care management team to address care coordination and chronic disease management needs related to Disease Management  Educational Needs  Care Coordination  Medication Management and Education  Psychosocial Support   Interventions:  02/16/20 inbound call completed with patient   Determined patient provided a urine specimen to PCP  Determined patient was prescribed an antibiotic for treatment of possible UTI, a urine culture was ordered  Educated patient on importance of taking full course of antibiotic prescribed for best results  Educated patient on s/s suggestive of reoccurring UTI  Reinforced ways to avoid UTI related to increasing water intake, using good perineal hygiene, wiping from front to back after urinating or having a bowel movement, urinating after sexual intercourse, avoiding bubble baths and or wearing thong underwear Patient Goals/Self Care/Activities:  Over the next 90 days, patient will  -  discuss my treatment options with the doctor or nurse - learn something new by asking, reading and searching the Internet every day - make shared treatment decisions with doctor  - increase water intake to 64oz-80oz per day - complete full course of antibiotics as directed  - contact PCP for reoccurring symptoms   Follow Up Plan: Telephone follow up appointment with care management team member scheduled for: 04/13/20        Patient Care Plan: Social Work Care Plan    Problem Identified: Care Coordination     Long-Range Goal: Identify resources to assist with vehicle adaptations   Start Date: 02/09/2020  Expected  End Date: 05/09/2020  Recent Progress: On track  Priority: Medium  Note:   Current Barriers:  . Transportation- unable to transport self due to barriers surrounding use of foot pedals . Limited knowledge of resources to assist with vehicle adaptations . Chronic conditions including DM II, CKD III, and HTN which put patient at increased risk of hospitalization  Social Work Clinical Goal(s):  [name] Over the next 120 days the patient will work with SW to identify resources to assist with desired vehicle adaptions  Interventions: . 1:1 collaboration with Glendale Chard, MD regarding development and update of comprehensive plan of care as evidenced by provider attestation and co-signature . Inter-disciplinary care team collaboration (see longitudinal plan of care) . Inbound call received from [facility] who reports the program does not assist with vehicle modifications . Successful outbound call placed to the patient  . Discussed the patient is still interested in vehicle adaptations but will most likely want to wait until after an upcoming surgery scheduled for 4.4.22 . Patient requests SW determine if Medicare benefit will cover any of the cost of vehicle adaptations . SW will research resources and insurance benefits prior to next scheduled call  Patient Goals/Self-Care Activities Over the next 45 days, patient will:   - Patient will self administer medications as prescribed Patient will attend all scheduled provider appointments Patient will call provider office for new concerns or questions Contact SW as needed prior to next scheduled call  Follow up Plan: SW will follow up with patient by phone over the next 60 days    Long-Range Goal: Collaborate with RN Care Manager to perform appropriate assessments to assist with care coordination needs   Start Date: 03/02/2020  Expected End Date: 06/30/2020  This Visit's Progress: On track  Priority: Low  Note:   Current Barriers:  . Chronic  conditions including DM II, CKD III, and HTN which put patient at increased risk of hospitalization  Social Work Clinical Goal(s):  [name] Over the next 120 days, patient will work with SW to address concerns related to care coordination  Interventions: . 1:1 collaboration with Glendale Chard, MD regarding development and update of comprehensive plan of care as evidenced by provider attestation and co-signature . Inter-disciplinary care team collaboration (see longitudinal plan of care) . Successful outbound call placed to the patient to assist with care coordination needs . Discussed the patients skin removal surgery has been approved by insurance and is scheduled for 4.4.22 . Determined patient is concerned she will need help in the home and has planned to hire a private duty caregiver for up to 3 nights if needed - patient does have a friend who can help some but is not physically capable of helping patient with transfers . Discussed the patient continues to see therapist at [facility] and feels like it is a good fit -  next appointment scheduled for Monday January 10 . Informed by the patient she has had a recent infestation of [place] Roaches that were found in a dishwasher recently installed in the patients apartment - the landlord has hired an Nurse, learning disability who has treated the apartment 3 times thus far. Will return in 1 week to confirm no longer a concern . Patient remains active in church family - celebrated Christmas with a church member and their family . Patient discusses possible desire to return to school in the fall to begin working on MSW  Patient Goals/Self-Care Activities Over the next 60 days, patient will:   - Patient will self administer medications as prescribed Patient will attend all scheduled provider appointments Patient will call provider office for new concerns or questions Contact SW as needed prior to next scheduled call  Follow up Plan: SW will follow up with  patient by phone over the next two month        Follow Up Plan: SW will follow up with patient by phone over the next two months   Daneen Schick, BSW, CDP Social Worker, Certified Dementia Practitioner [place] [facility] [place] [facility] [phone]  Total time spent performing care coordination and/or care management activities with the patient by phone or face to face = 62 minutes.

## 2020-03-02 NOTE — Patient Instructions (Signed)
   Goals we discussed today:  Goals Addressed            This Visit's Progress   . Work with SW to manage care coordination needs       Timeframe:  Long-Range Goal Priority:  [employer] Start Date:   12.16.21                          Expected End Date:   3.16.22                    Next date of contact: 3.7.22  Patient Goals/Self-Care Activities Over the next 45 days, patient will:   - Patient will self administer medications as prescribed -Patient will attend all scheduled provider appointments -Patient will call provider office for new concerns or questions -Contact SW as needed prior to next scheduled call

## 2020-03-05 ENCOUNTER — Other Ambulatory Visit: Payer: Self-pay

## 2020-03-05 ENCOUNTER — Encounter: Payer: Self-pay | Admitting: Addiction (Substance Use Disorder)

## 2020-03-05 ENCOUNTER — Ambulatory Visit (INDEPENDENT_AMBULATORY_CARE_PROVIDER_SITE_OTHER): Payer: Medicare HMO | Admitting: Addiction (Substance Use Disorder)

## 2020-03-05 DIAGNOSIS — F4323 Adjustment disorder with mixed anxiety and depressed mood: Secondary | ICD-10-CM | POA: Diagnosis not present

## 2020-03-05 NOTE — Chronic Care Management (AMB) (Addendum)
Chronic Care Management   CCM RN Visit Note  02/29/2020 Name: Teresa Golden MRN: [id number] DOB: 1945/09/03  Subjective: Teresa Golden is a 75 y.o. year old female who is a primary care patient of Glendale Chard, MD. The CCM team was consulted for assistance with chronic disease management and care coordination needs.    Engaged with patient by telephone for follow up visit in response to provider referral for pharmacy case management and/or care coordination services.   Assessment/Interventions: Review of patient past medical history, allergies, medications, health status, including review of consultants reports, laboratory and other test data, was performed as part of comprehensive evaluation and provision of chronic care management services.   SDOH (Social Determinants of Health) assessments and interventions performed:    CCM Care Plan  Allergies  Allergen Reactions  . Meloxicam Other (See Comments)    Fever; muscle aches; "flu-like" symptoms  . Other     Rose fever and hay fever     Outpatient Encounter Medications as of 02/29/2020  Medication Sig  . amLODipine (NORVASC) 5 MG tablet Take 1 tablet (5 mg total) by mouth daily.  . B Complex-C (B-COMPLEX WITH VITAMIN C) tablet Take 1 tablet by mouth daily.  . Biotin 10000 MCG TABS Take 1 tablet by mouth daily.  . carvedilol (COREG CR) 20 MG 24 hr capsule TAKE 1 CAPSULE DAILY  . Cholecalciferol 5000 units TABS Take 5,000 Units by mouth daily.  . Colchicine 0.6 MG CAPS TAKE 1 CAPSULE DAILY AS    NEEDED  . Cyanocobalamin (VITAMIN B-12) 2500 MCG SUBL Take 2,500 mcg by mouth daily.  . diclofenac Sodium (VOLTAREN) 1 % GEL APPLY 2 GRAMS TO AFFECTED AREA(S) 4 TIMES DAILY AS NEEDED  . docusate sodium (COLACE) 100 MG capsule Take 1 capsule (100 mg total) by mouth 2 (two) times daily. (Patient not taking: Reported on 02/09/2020)  . famotidine (PEPCID) 20 MG tablet Take 20 mg by mouth daily. PRN  . gabapentin (NEURONTIN) 300 MG capsule Take  600 mg by mouth at bedtime. Pt taking 600 MG at bedtime  . glucose blood (ONETOUCH VERIO) test strip Use as instructed to check blood sugars 2 times per day dx: e11.65  . insulin degludec (TRESIBA FLEXTOUCH) 100 UNIT/ML SOPN FlexTouch Pen Inject 0.09 mLs (9 Units total) into the skin daily at 10 pm. (Patient taking differently: Inject 8 Units into the skin daily at 10 pm.)  . losartan (COZAAR) 100 MG tablet Take 1 tablet (100 mg total) by mouth daily.  . Multiple Vitamins-Minerals (MULTIVITAMIN WITH MINERALS) tablet Take 1 tablet by mouth daily. Women's 50+  . naproxen sodium (ANAPROX) 220 MG tablet Take 440 mg by mouth 2 (two) times daily as needed (pain). 2 tablets every night at bedtime  . OneTouch Delica Lancets 03J MISC Use as instructed to check blood sugars 2 times per day dx: e11.65  . OZEMPIC, 1 MG/DOSE, 2 MG/1.5ML SOPN INJECT INTO SKIN ONCE A WEEK (Patient taking differently: 1mg )  . simvastatin (ZOCOR) 10 MG tablet TAKE 1 TABLET DAILY  . SYNTHROID 25 MCG tablet Take 0.5 tablet (12.5 mcg) by mouth on Mondays through Fridays, then take 1 tablet (25 mcg) by mouth on Saturdays and Sundays.   No facility-administered encounter medications on file as of 02/29/2020.    Patient Active Problem List   Diagnosis Date Noted  . Type 2 diabetes mellitus with stage 3 chronic kidney disease, with long-term current use of insulin ([place]) 11/29/2019  . Panniculitis 09/30/2019  .  Back pain 09/30/2019  . Hepatoma ([place]) 01/21/2019  . Other cirrhosis of liver ([place]) 07/05/2018  . Hypertensive nephropathy 07/05/2018  . Chronic renal disease, stage III ([place]) 07/05/2018  . Chronic left shoulder pain 07/05/2018  . Hypothyroidism 02/06/2018  . Hepatitis C virus infection cured after antiviral drug therapy 02/04/2018  . Osteoarthritis of right knee 07/30/2017  . S/P laparoscopic sleeve gastrectomy July 2018 08/26/2016  . Preop cardiovascular exam 07/30/2016  . Dyspnea on exertion 07/30/2016  . Class 2 severe  obesity due to excess calories with serious comorbidity and body mass index (BMI) of 39.0 to 39.9 in adult ([place]) 07/30/2016  . Bilateral lower extremity edema 07/30/2016    Conditions to be addressed/monitored:DMII, CKD Stage III and Hypertensive Nephropathy, Class 2 Obesity, Anxiety    Patient Care Plan: Elective Surgery - Panniculectomy     Problem Identified: stretched out, excess fat and overhanging skin from abdomen   Priority: Medium     Long-Range Goal: Complete Panniculectomy Procedure to manage stretched out, excess fat and overhanging skin from abdomen   Start Date: 02/29/20  Expected End Date: 06/26/20  This Visit's Progress: On track  Priority: Medium   Note:    Current Barriers:   Ineffective Self Health Maintenance  Currently UNABLE TO independently self manage needs related to chronic health conditions.   Knowledge Deficits related to short term plan for care coordination needs and long term plans for chronic disease management needs Nurse Case Manager Clinical Goal(s):   Over the next 90 days, patient will work with care management team to address care coordination and chronic disease management needs related to Disease Management  Educational Needs  Care Coordination  Medication Management and Education  Psychosocial Support   Interventions:   Determined patient will undergo a Panniculectomy procedure by Dr. Marla Roe on 05/28/20  Determined this procedure is scheduled for outpatient but patient may need inpatient services pending hemodynamic stability following the procedure  Determined and discussed the following appointments related to this procedure:  05/08/20 Pre-Op visit for Panniculectomy procedure (Dr. Marla Roe) 05/25/20 Lab visit for Panniculectomy procedure 05/28/20 Panniculectomy outpatient/inpatient procedure  06/05/20 Post-Op Panniculectomy follow up appointment with Dr. Marla Roe  06/12/20 & 06/26/20 Post-Op Panniculectomy follow up appointments with  Roetta Sessions, PA-C   Over the next 90-120 days, patient will;  - complete the following scheduled appointments:  05/08/20 Pre-Op visit for Panniculectomy procedure (Dr. Audelia Hives, DO) 05/25/20 Lab visit for Panniculectomy procedure 05/28/20 Panniculectomy outpatient/inpatient procedure  06/05/20 Post-Op Panniculectomy follow up appointment with Dr. Marla Roe  06/12/20 & 06/26/20 Post-Op Panniculectomy follow up appointments with Roetta Sessions, PA-C              Follow Up Plan: Telephone follow up appointment with care management team member scheduled for:  04/13/20  Plan:Telephone follow up appointment with care management team member scheduled for:  04/13/20   Barb Merino, RN, BSN, CCM Care Management Coordinator [facility]  Direct Phone: [phone]

## 2020-03-05 NOTE — Patient Instructions (Signed)
Visit Information  Goals    Other   .  Complete Panniculectomy      Timeframe:  Long-Range Goal Priority:  Medium Start Date: 02/29/20                            Expected End Date: 06/26/20   Next Follow up date: 04/13/20  Over the next 90-120 days, patient will;  - complete the following scheduled appointments:  05/08/20 Pre-Op visit for Panniculectomy procedure (Dr. Marla Roe) 05/25/20 Lab visit for Panniculectomy procedure 05/28/20 Panniculectomy outpatient/inpatient procedure  06/05/20 Post-Op Panniculectomy follow up appointment with Dr. Marla Roe  06/12/20 & 06/26/20 Post-Op Panniculectomy follow up appointments with Roetta Sessions, PA-C                       The patient verbalized understanding of instructions, educational materials, and care plan provided today and declined offer to receive copy of patient instructions, educational materials, and care plan.   Telephone follow up appointment with care management team member scheduled for: 04/13/20  Lynne Logan, RN

## 2020-03-05 NOTE — Progress Notes (Signed)
      [facility] Counselor/Therapist Progress Note  [name], MRN: [id number],    Date: 03/05/2020   Time Spent: 59 mins  Treatment Type: Individual Therapy  Reported Symptoms: frustrated  Mental Status Exam:  Appearance:   Neat     Behavior:  Appropriate and Sharing  Motor:  Normal  Speech/Language:   Clear and Coherent  Affect:  Appropriate  Mood:  irritable  Thought process:  normal  Thought content:    WNL  Sensory/Perceptual disturbances:    WNL  Orientation:  x4  Attention:  Good  Concentration:  Good  Memory:  WNL  Fund of knowledge:   Good  Insight:    Good  Judgment:   Good  Impulse Control:  Good   Risk Assessment: Danger to Self:  No Self-injurious Behavior: No Danger to Others: No Duty to Warn:no Physical Aggression / Violence:No  Access to Firearms a concern: No  Gang Involvement:No   Subjective: Client reported feeling frustrated with guys in her life. Client processed how a guy she cut things off with isnt respecting her boundary of wanting to be the only women. Therapist used MI to support client to help encourage her to holding her boundary. Client seeing herself with more respect, allowing her to let go of relationships that dont treat her with that resect. Client is making progress forgiving herself for past sins that made her take bad treatment. Client processed her plans for getting her BSW & MSW and asked for therapist to hold her accountable. Therapist used SFT with client to help her plan how to make those appropriate steps.   Interventions: Motivational Interviewing and Solution-Oriented/Positive Psychology  Diagnosis:   ICD-10-CM   1. Adjustment disorder with mixed anxiety and depressed mood  F43.23    Plan of Care:  Client is to return to therapy with therapist every 1-2 weeks as needed to process traumas/ frustrations in a safe space, to be re-evaluated in 3 months.  Client is to practice mindfulness AEB daily meditation  and body scans or as needed when flooded by emotion/pain.  Client is to learn/practice DBT wise mind & radical acceptance. Client is to practice self-compassion AEB being gentle with themselves, utilizing self-care techniques daily or as needed when grieving something in the moment.  Client is to process grief/pain of their loss in a somatic body-felt sense way: ie using mindfulness, brainspotting, or trauma release as a method for releasing body pain/tension caused by grief.  Barnie Del, LCSW, LCAS, CCTP, CCS-I, BSP

## 2020-03-08 ENCOUNTER — Other Ambulatory Visit: Payer: Self-pay

## 2020-03-08 MED ORDER — SIMVASTATIN 10 MG PO TABS: 10.0000 mg | ORAL_TABLET | Freq: Every day | ORAL | 1 refills | Status: DC

## 2020-03-13 ENCOUNTER — Other Ambulatory Visit: Payer: Self-pay

## 2020-03-13 ENCOUNTER — Encounter: Payer: Self-pay | Admitting: Internal Medicine

## 2020-03-13 MED ORDER — AMLODIPINE BESYLATE 5 MG PO TABS: 5.0000 mg | ORAL_TABLET | Freq: Every day | ORAL | 1 refills | Status: DC

## 2020-03-15 ENCOUNTER — Ambulatory Visit: Payer: Self-pay

## 2020-03-15 ENCOUNTER — Ambulatory Visit: Payer: Medicare HMO | Admitting: Internal Medicine

## 2020-03-15 DIAGNOSIS — E559 Vitamin D deficiency, unspecified: Secondary | ICD-10-CM

## 2020-03-15 DIAGNOSIS — N1831 Chronic kidney disease, stage 3a: Secondary | ICD-10-CM

## 2020-03-15 DIAGNOSIS — E1122 Type 2 diabetes mellitus with diabetic chronic kidney disease: Secondary | ICD-10-CM

## 2020-03-15 NOTE — Chronic Care Management (AMB) (Signed)
Chronic Care Management Pharmacy  Name: Teresa Golden  MRN: [id number] DOB: 03/03/1945  Chief Complaint/ HPI  [name],  75 y.o. , female presents for their Follow-Up CCM visit with the clinical pharmacist via telephone due to COVID-19 Pandemic. Patient has had gastric sleeve surgery and is looking forward to having the extra skin taken off in April. She is going back to college and is working on [employer]. She loves water aerobics but she is concerned about COVID safety. [facility] the  [facility] and she is going to start [place]. Her first week is the 5 th of this month. She fixed a big dinner for her pastor and the pastors wife.  She lives by herself and she does not have any grandchildren. Her child passed away in 05/09/1991. She has a therapist that she speaks with at [facility] and she has seen her three times, and she is now seeing her every two weeks. 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 She attends [employer] on [address] near [place]. She is very involved in the homeless ministry and they are very careful with COVID. They send hot meals every Tuesday.   PCP : Glendale Chard, MD  Their chronic conditions include: Type 2 Diabetes mellitus, Hypertension, Hypothyroidism, CKD, Osteoarthritis  Office Visits: 11/29/19 OV: Presents for gynecology referral. Pt reports having pelvic pain for the past month. Schedule for pelvic/TV ultrasound. HTN fair control. HgbA1c up to 6.7%. GFR has decreased. Influenza vaccine administered.   11/18/19 Telephone call: Pt notified Ozempic delivered from patient assistance program  09/06/19 Nurse visit: BP 132/72 manually, 148/80 on home BP cuff pt brought today to office. Follow up with PCP in September.   08/18/19 AWV and OV: HTN uncontrolled. Advised pt to decrease losartan back to 50 mg twice daily. Start amlodipine 52m once daily with evening meal. Return in 2 weeks for nurse visit. Liver function, kidney function, and  HgbA1c stable. STD tests negative except for exposure to herpes type 2.   07/27/19 Telephone call: TTyler Aasdecreased to 8 units nightly  04/19/19 OV: Presented for DM and HTN follow up visit. Pt reported more anxiousness lately due to current living situation. She lives in an assisted living facility and stated that it has too many rules and she cannot have visitors, she is looking for a new apartment. DM chronic, fair control. Plan to wean off of insulin eventually. Labs ordered (HgbA1c, BMP8+EGFR). Pt denied prescription treatment for anxiety but would like to start therapy. She may also benefit from Mg supplementation. Referred to Hepatology for further evaluation of cirrhosis of the liver and hepatoma.  02/08/19 AWV  Consult Visit: 09/30/19 Plastic surgery OV w/ Dr. DMarla Roe Consult. Presents for evaluation for abdomen. Will need release from physical therapy and updated A1c prior to surgery. Pt may need more physical therapy. Recommend panniculectomy.   CCM Encounters: 08/16/19 RN: Pt called about elevated BPs for several days. Pt called EMS Sunday due to severe headache and BP 190/100. Pt decided to double up on losartan 564m(started taking 2 tabs twice daily instead of 1 tablet twice daily). Advised pt not safe to double up on losartan. OV scheduled for 6/24 with PCP.   07/26/19 SW and RN: Discussed pt current diabetes treatment regimen (Tresiba 14 units daily). Pt reported low BG readings (low 80s) and would like to decrease [place] possible. Average BG readings 108-117. Referred to pharmacy for diabetes management. Collaborated with PCP regarding FBG  readings and Tresiba dosing, will decrease to 8 units daily.  06/22/19 RN: Evaluated diabetes treatment plan and pt goals. Provided pt education regarding diet, exercise, and medication adherence. Advised pt to check BG 1-2 times daily before meals and record.   03/29/19: Ozempic approved via [employer] through 02/24/20.  03/28/19 SW: Addressed  care coordination needs regard emergency alert system and arranging ental care  03/21/19 RN: Discussed timing of COVID and Shingrix vaccines  03/18/19 SW: Care coordination and collaboration regarding dental appointment. Completed Ozempic PAP finished and faxed to [employer].   1/11/2 PharmD: Pt reported FBGs have not exceeded 130. She increased Tresiba to 14 units daily due to higher blood sugars (she had been using 7-10 units nightly). Attempting to get FreeStyle Libre covered on insurance, but unlikely due to current guidelines. Pt reported taking statin M/W/F. Stated she is trying to take daily now. Encourage pt to continue aspirin 88m daily.   03/04/19 RN: Discussed proper timing of Shingrix vaccine and COVID vaccines. Instructed to get second shingles vaccine as planned (2/15) and then wait 2 weeks until receiving COVID vaccines  02/28/19 PharmD: Comprehensive medication review performed.   Medications: Outpatient Encounter Medications as of 03/15/2020  Medication Sig  . amLODipine (NORVASC) 5 MG tablet Take 1 tablet (5 mg total) by mouth daily.  . B Complex-C (B-COMPLEX WITH VITAMIN C) tablet Take 1 tablet by mouth daily.  . Biotin 10000 MCG TABS Take 1 tablet by mouth daily.  . carvedilol (COREG CR) 20 MG 24 hr capsule TAKE 1 CAPSULE DAILY  . Cholecalciferol 5000 units TABS Take 5,000 Units by mouth daily.  . Colchicine 0.6 MG CAPS TAKE 1 CAPSULE DAILY AS    NEEDED  . Cyanocobalamin (VITAMIN B-12) 2500 MCG SUBL Take 2,500 mcg by mouth daily.  . diclofenac Sodium (VOLTAREN) 1 % GEL APPLY 2 GRAMS TO AFFECTED AREA(S) 4 TIMES DAILY AS NEEDED  . docusate sodium (COLACE) 100 MG capsule Take 1 capsule (100 mg total) by mouth 2 (two) times daily. (Patient not taking: Reported on 02/09/2020)  . famotidine (PEPCID) 20 MG tablet Take 20 mg by mouth daily. PRN  . gabapentin (NEURONTIN) 300 MG capsule Take 600 mg by mouth at bedtime. Pt taking 600 MG at bedtime  . glucose blood (ONETOUCH VERIO) test  strip Use as instructed to check blood sugars 2 times per day dx: e11.65  . insulin degludec (TRESIBA FLEXTOUCH) 100 UNIT/ML SOPN FlexTouch Pen Inject 0.09 mLs (9 Units total) into the skin daily at 10 pm. (Patient taking differently: Inject 8 Units into the skin daily at 10 pm.)  . losartan (COZAAR) 100 MG tablet Take 1 tablet (100 mg total) by mouth daily.  . Multiple Vitamins-Minerals (MULTIVITAMIN WITH MINERALS) tablet Take 1 tablet by mouth daily. Women's 50+  . naproxen sodium (ANAPROX) 220 MG tablet Take 440 mg by mouth 2 (two) times daily as needed (pain). 2 tablets every night at bedtime  . OneTouch Delica Lancets 329HMISC Use as instructed to check blood sugars 2 times per day dx: e11.65  . OZEMPIC, 1 MG/DOSE, 2 MG/1.5ML SOPN INJECT INTO SKIN ONCE A WEEK (Patient taking differently: 158m  . simvastatin (ZOCOR) 10 MG tablet Take 1 tablet (10 mg total) by mouth daily.  . Marland KitchenYNTHROID 25 MCG tablet Take 0.5 tablet (12.5 mcg) by mouth on Mondays through Fridays, then take 1 tablet (25 mcg) by mouth on Saturdays and Sundays.   No facility-administered encounter medications on file as of 03/15/2020.  Current Diagnosis/Assessment:    Goals Addressed            This Visit's Progress   . Pharmacy Care Plan       CARE PLAN ENTRY (see longitudinal plan of care for additional care plan information)  Current Barriers:  . Chronic Disease Management support, education, and care coordination needs related to Hypertension, Diabetes, and Hypothyroidism   Hypertension BP Readings from Last 3 Encounters:  02/14/20 110/75  02/09/20 122/80  01/06/20 (!) 149/69   . Pharmacist Clinical Goal(s): o Over the next 90 days, patient will work with PharmD and providers to achieve BP goal <130/80 . Current regimen:   Carvedilol CR 47m daily  Losartan 592mtwice daily  Amlodipine 15m48maily . Interventions: o Recommend patient increase exercise to 30 minutes daily 5 times per week o Recommend  patient limit salt intake o Advised patient to start checking blood pressure daily o Patient mentions she is almost out of amlodipine o Will check in to patient assistance program for Carvedilol CR 20 mg  . Patient self care activities - Over the next 90 days, patient will: o Check BP daily, document, and provide at future appointments o Ensure daily salt intake < 2300 mg/day o Exercise 30 minutes per day, 5 days per week o Call in refill for amlodipine to [facility]  Hyperlipidemia Lab Results  Component Value Date/Time   LDLCALC 77 02/09/2020 02:43 PM   . Pharmacist Clinical Goal(s): o Over the next 90 days, patient will work with PharmD and providers to achieve LDL goal < 70 . Current regimen:  o Simvastatin 21m38mily (Monday through Friday) . Interventions: o Recommend lipid panel at next PCP appointment o Discussed appropriate goal for LDL (less than 70) . Patient self care activities - Over the next 90 days, patient will: o Take cholesterol medication as directed o Exercise 30 minutes daily, 5 times per week o Limit fried and fatty foods  Diabetes Lab Results  Component Value Date/Time   HGBA1C 6.5 (H) 08/18/2019 02:10 PM   HGBA1C 6.3 (H) 04/19/2019 12:12 PM   . Pharmacist Clinical Goal(s): o Over the next 90 days, patient will work with PharmD and providers to maintain A1c goal <7% . Current regimen:   Tresiba 100 units/mL 8 units daily   Ozempic 1mg 115mkly . Interventions: o Discussed appropriate blood sugar goals (less than 130 if fasting, less than 180 if 2 hours after eating) o Determined patient has received enough Ozempic to last until the end of the year o Discussed eligibility criteria for 2022 Ozempic patient assistance program and when patient can apply - Will determine criteria and notify patient when she can reapply o Provided dietary and exercise recommendations o Recommend patient increase water intake to 64 ounces daily . Patient self care  activities - Over the next 90 days, patient will: o Check blood sugar twice daily, document, and provide at future appointments o Contact provider with any episodes of hypoglycemia o Exercise 30 minutes daily 5 times per week o Cut back on carbohydrates and sweets o Increase water intake (goal of 64 ounces daily)  Medication management . Pharmacist Clinical Goal(s): o Over the next 90 days, patient will work with PharmD and providers to maintain optimal medication adherence . Current pharmacy: [facility] . Interventions o Comprehensive medication review performed. o Performed medication cost review comparing [facility] and [facility], but unable to find patient's exact plan on medicare.gov o Continue current medication management  strategy . Patient self care activities - Over the next 90 days, patient will: o Focus on medication adherence by utilization of pill box o Take medications as prescribed o Report any questions or concerns to PharmD and/or provider(s)  Please see past updates related to this goal by clicking on the "Past Updates" button in the selected goal         Diabetes   Goal < 7%  Recent Relevant Labs: Lab Results  Component Value Date/Time   HGBA1C 6.7 (H) 11/29/2019 02:11 PM   HGBA1C 6.5 (H) 08/18/2019 02:10 PM   MICROALBUR 10 02/09/2020 10:59 AM   MICROALBUR 10 01/17/2019 06:09 PM    Kidney Function Lab Results  Component Value Date/Time   CREATININE 0.96 11/29/2019 02:11 PM   CREATININE 0.83 08/18/2019 02:10 PM   GFRNONAA 58 (L) 11/29/2019 02:11 PM   GFRAA 67 11/29/2019 02:11 PM   Checking BG: 2x per Day   Recent FBG Readings: 125 this morning (90-110)  Recent pre-meal BG readings:  Recent 2hr PP BG readings:   Recent HS BG readings (2-3 hrs after eating):  Patient has failed these meds in past: Novolog, Novolog 70/30, Januvia Patient is currently controlled on the following medications:   Tresiba 100 units/mL 12 units daily    Ozempic 51m weekly  Last diabetic Foot exam: 01/17/19 Last diabetic Eye exam: Lab Results  Component Value Date/Time   HMDIABEYEEXA No Retinopathy 01/11/2020 12:00 AM    We discussed:  Pt has received enough Ozempic to last the rest of the year  Pt asked about Ozempic patient assistance eligibility criteria for 2022  Pt states she has lost another 5 lbs  She has lost over 90 lbs through the gastric sleeve  Pt reports BG has been a little higher recently . Diet extensively o Pt has been eating a few more cookies and carbohydrates lately - She has lowered the amounts of sweets that she is eating.  o She has been drinking [place] She states that she understands what has been increasing her blood sugar and she is going to try to cut back o Discussed importance of portion control o Recommend pt drink 64 ounces of water daily - Advised pt to stay well hydrated due to decrease in kidney function recently . Discussed GFR/kidney function - Pt states she has been drinking less water lately . Pt has been approved for patient assistance for Ozempic and [place]. Exercise extensively o Pt recently went through physical therapy for bursitis in her hip (twice weekly) o Pt has been using elastic bands at home (3 times weekly) o Going back to the pool on Monday o Recommend pt get 30 minutes of moderate intensity exercise daily 5 times a week (150 minutes total per week)  Plan Continue current medications  CPA to follow up and determine whether Ozempic or [place] both are approved through patient assistance.   Hypertension   Goal <130/80  Office blood pressures are  BP Readings from Last 3 Encounters:  02/14/20 110/75  02/09/20 122/80  01/06/20 (!) 149/69   Patient has failed these meds in the past: Atenolol, valsartan Patient is currently controlled on the following medications:   Carvedilol CR 2101mdaily  Losartan 5060mwice daily  Amlodipine  5mg69mily  Patient checks BP at home twice daily  Patient home BP readings are ranging: 141/72, 135/66  We discussed:  Pt states she needs a refill of amlodipine and will call it in  soon  Advised pt that she has 1 refill left on her prescription at [facility]  Diet and exercise extensively  Limiting salt in foods  Pt states BP has been better recently (was elevated after cortisone injection)  Pt also mentions stress from moving as a reason for increased BP at previous visit  Plan Continue current medications  CPA to follow up with patient Coreg CR 20 mg for patient assistance application  Hyperlipidemia   Lipid Panel     Component Value Date/Time   CHOL 150 02/09/2020 1443   TRIG 55 02/09/2020 1443   HDL 61 02/09/2020 1443   LDLCALC 77 02/09/2020 1443    The 10-year ASCVD risk score Mikey Bussing DC Jr., et al., 2013) is: 22.2%   Values used to calculate the score:     Age: 58 years     Sex: Female     Is Non-Hispanic African American: Yes     Diabetic: Yes     Tobacco smoker: No     Systolic Blood Pressure: 092 mmHg     Is BP treated: Yes     HDL Cholesterol: 61 mg/dL     Total Cholesterol: 150 mg/dL   Patient has failed these meds in past: N/A Patient is currently uncontrolled on the following medications:   Simvastatin 80m daily (Monday-Friday)  We discussed:   Overall cholesterol is great, but LDL above goal of 70 Diet and exercise extensively  Plan Continue current medications  Recommend recheck lipid panel at next PCP visit. If LDL above 70, recommend increase to simvastatin 252mdaily  Hypothyroidism   Lab Results  Component Value Date/Time   TSH 1.230 07/05/2018 11:17 AM   Patient has failed these meds in past: N/A Patient is currently controlled on the following medications:   Synthroid 2581m1/2 tablet daily Monday through Friday, 1 tablet on Saturday and Sunday   Plan Continue current medications  Gout   Patient has failed these  meds in past: N/A Patient is currently controlled on the following medications:   Colchicine 0.6mg28mpsule daily as needed  Plan Continue current medications  Osteoarthritis   Patient has failed these meds in past: Meloxicam, Tramadol Patient is currently controlled on the following medications:   Diclofenac 1% gel apply 2 grams 4 times daily as needed  Aleve 220mg50mablets every night at bedtime (up to twice daily as needed)  Plan Continue current medications  Neuropathy/ Leg cramps   Patient has failed these meds in past: N/A Patient is currently controlled on the following medications:   Gabapentin 300mg 73medtime  Plan Continue current medications  Start Magnesium cream nightly for leg cramps Review symptoms at follow up to ensure not statin related  Vaccines   Reviewed and discussed patient's vaccination history.    Immunization History  Administered Date(s) Administered  . Fluad Quad(high Dose 65+) 11/10/2018, 11/29/2019  . Influenza, High Dose Seasonal PF 12/21/2017, 11/10/2018  . Influenza,inj,quad, With Preservative 02/25/2016  . Influenza-Unspecified 10/25/2016  . Moderna Sars-Covid-2 Vaccination 03/28/2019, 04/25/2019, 01/02/2020  . Pneumococcal Polysaccharide-23 11/02/2013  . Pneumococcal-Unspecified 11/24/2016  . Zoster Recombinat (Shingrix) 11/03/2018, 12/31/2018   We discussed:  Tdap- due every 10 years  Plan Recommend Tdap vaccine in office  Medication Management   Pt [facility] for [facility] medications Uses pill box? Yes Pt endorses 100% compliance  We discussed:   Importance of taking medications daily as directed   Plan Continue current medication management strategy    Follow  up: 3 month phone visit  [name], PharmD Clinical Pharmacist [facility] [phone]

## 2020-03-15 NOTE — Progress Notes (Signed)
US

## 2020-03-20 ENCOUNTER — Telehealth: Payer: Medicare HMO

## 2020-03-20 ENCOUNTER — Telehealth: Payer: Self-pay

## 2020-03-20 NOTE — Telephone Encounter (Cosign Needed)
   03/20/2020  Teresa Golden 1945-11-15 [id number]    Voice message received from patient requesting a call back to discuss some general health related concerns. Noted patient is scheduled for in an office visit with PCP on 04/12/20. The patient was referred to the case management team for assistance with care management and care coordination.   Follow Up Plan: Telephone follow up appointment with care management team member scheduled for: 04/19/20  Barb Merino, RN, BSN, CCM Care Management Coordinator [facility]  Direct Phone: [phone]

## 2020-03-22 ENCOUNTER — Ambulatory Visit (INDEPENDENT_AMBULATORY_CARE_PROVIDER_SITE_OTHER): Payer: Medicare HMO | Admitting: Addiction (Substance Use Disorder)

## 2020-03-22 DIAGNOSIS — F4323 Adjustment disorder with mixed anxiety and depressed mood: Secondary | ICD-10-CM | POA: Diagnosis not present

## 2020-03-22 NOTE — Progress Notes (Addendum)
[facility] Counselor/Therapist Progress Note  [name], MRN: [id number],    Date: 03/22/2020   Time Spent: 30mins  Treatment Type: Individual Therapy  Reported Symptoms: tearful, guilty, feels like a failure.  Mental Status Exam:  Appearance:   NA     Behavior:  Appropriate and Sharing  Motor:  Normal  Speech/Language:   Clear and Coherent  Affect:  Appropriate  Mood:  anxious and sad  Thought process:  normal  Thought content:    Rumination  Sensory/Perceptual disturbances:    WNL  Orientation:  x4  Attention:  Good  Concentration:  Good  Memory:  WNL  Fund of knowledge:   Good  Insight:    Fair  Judgment:   Good  Impulse Control:  Good   Risk Assessment: Danger to Self:  No Self-injurious Behavior: No Danger to Others: No Duty to Warn:no Physical Aggression / Violence:No  Access to Firearms a concern: No  Gang Involvement:No   Virtual Visit via TELEPHONE :  I connected with client by by telephone because the [employer] wouldn't work, with their informed consent, and verified client privacy and that I am speaking with the correct person using two identifiers. I discussed the limitations, risks, security and privacy concerns of performing psychotherapy and management service virtually and confirmed their location. I also discussed with the patient that there may be a patient responsible charge related to this service and to confirm with the front desk if their insurance covers teletherapy. I also discussed with the patient the availability of in person appointments. The patient expressed understanding and agreed to proceed. I discussed the treatment planning with the client. The client was provided an opportunity to ask questions and all were answered. The client agreed with the plan and demonstrated an understanding of the instructions. The client was advised to call our office if symptoms worsen or feel they are in a crisis state and need  immediate contact. Client also reminded of a crisis line number and to use 9-1-1 if there's an emergency.  Therapist Location: home; Client Location: home.  Subjective: Client reported struggles with her living conditions and also dealing with preparations for her surgery. Client struggling with not being able bodied and feeling like her landlord is taking advantage of that, allowing edie's house floor not to be completed. Client processed her frustration and anxiety and therapist used MI to provide support for her and SFT with clint to help her find steps she could take to help herself out. Client became tearful when she processed feeling guilty for not "acting like a christian" with her behaviors and cried when she thought about not feeling worthy. Therapist used CBT with client to challenge her belief about being a failure and used MI to affirm her strengths and give herself grace. Therapist assessed for stability and client denied SI/HI/AVH.   Interventions: Motivational Interviewing and Solution-Oriented/Positive Psychology &CBT  Diagnosis:   ICD-10-CM   1. Adjustment disorder with mixed anxiety and depressed mood  F43.23      Plan of Care:  Client is to return to therapy with therapist every 1-2 weeks as needed to process traumas/ frustrations in a safe space, to be re-evaluated in 3 months.  Client is to practice mindfulness AEB daily meditation and body scans or as needed when flooded by emotion/pain.  Client is to learn/practice DBT wise mind & radical acceptance. Client is to practice self-compassion AEB being gentle with themselves, utilizing self-care techniques daily  or as needed when grieving something in the moment.  Client is to process grief/pain of their loss in a somatic body-felt sense way: ie using mindfulness, brainspotting, or trauma release as a method for releasing body pain/tension caused by grief.  Barnie Del, LCSW, LCAS, CCTP, CCS-I,  BSP

## 2020-03-22 NOTE — Patient Instructions (Signed)
Visit Information  Goals Addressed            This Visit's Progress   . Pharmacy Care Plan       CARE PLAN ENTRY (see longitudinal plan of care for additional care plan information)  Current Barriers:  . Chronic Disease Management support, education, and care coordination needs related to Hypertension, Diabetes, and Hypothyroidism   Hypertension BP Readings from Last 3 Encounters:  02/14/20 110/75  02/09/20 122/80  01/06/20 (!) 149/69   . Pharmacist Clinical Goal(s): o Over the next 90 days, patient will work with PharmD and providers to achieve BP goal <130/80 . Current regimen:   Carvedilol CR 20mg  daily  Losartan 50mg  twice daily  Amlodipine 5mg  daily . Interventions: o Recommend patient increase exercise to 30 minutes daily 5 times per week o Recommend patient limit salt intake o Advised patient to start checking blood pressure daily o Patient mentions she is almost out of amlodipine o Will check in to patient assistance program for Carvedilol CR 20 mg  . Patient self care activities - Over the next 90 days, patient will: o Check BP daily, document, and provide at future appointments o Ensure daily salt intake < 2300 mg/day o Exercise 30 minutes per day, 5 days per week o Call in refill for amlodipine to [facility]  Hyperlipidemia Lab Results  Component Value Date/Time   LDLCALC 77 02/09/2020 02:43 PM   . Pharmacist Clinical Goal(s): o Over the next 90 days, patient will work with PharmD and providers to achieve LDL goal < 70 . Current regimen:  o Simvastatin 10mg  daily (Monday through Friday) . Interventions: o Recommend lipid panel at next PCP appointment o Discussed appropriate goal for LDL (less than 70) . Patient self care activities - Over the next 90 days, patient will: o Take cholesterol medication as directed o Exercise 30 minutes daily, 5 times per week o Limit fried and fatty foods  Diabetes Lab Results  Component Value Date/Time    HGBA1C 6.5 (H) 08/18/2019 02:10 PM   HGBA1C 6.3 (H) 04/19/2019 12:12 PM   . Pharmacist Clinical Goal(s): o Over the next 90 days, patient will work with PharmD and providers to maintain A1c goal <7% . Current regimen:   Tresiba 100 units/mL 8 units daily   Ozempic 1mg  weekly . Interventions: o Discussed appropriate blood sugar goals (less than 130 if fasting, less than 180 if 2 hours after eating) o Determined patient has received enough Ozempic to last until the end of the year o Discussed eligibility criteria for 2022 Ozempic patient assistance program and when patient can apply - Will determine criteria and notify patient when she can reapply o Provided dietary and exercise recommendations o Recommend patient increase water intake to 64 ounces daily . Patient self care activities - Over the next 90 days, patient will: o Check blood sugar twice daily, document, and provide at future appointments o Contact provider with any episodes of hypoglycemia o Exercise 30 minutes daily 5 times per week o Cut back on carbohydrates and sweets o Increase water intake (goal of 64 ounces daily)  Medication management . Pharmacist Clinical Goal(s): o Over the next 90 days, patient will work with PharmD and providers to maintain optimal medication adherence . Current pharmacy: [facility] . Interventions o Comprehensive medication review performed. o Performed medication cost review comparing [facility] and [facility], but unable to find patient's exact plan on medicare.gov o Continue current medication management strategy . Patient  self care activities - Over the next 90 days, patient will: o Focus on medication adherence by utilization of pill box o Take medications as prescribed o Report any questions or concerns to PharmD and/or provider(s)  Please see past updates related to this goal by clicking on the "Past Updates" button in the selected goal         The patient  verbalized understanding of instructions, educational materials, and care plan provided today and agreed to receive a mailed copy of patient instructions, educational materials, and care plan.   The pharmacy team will reach out to the patient again over the next 30 days.   Mayford Knife, [facility]

## 2020-03-27 ENCOUNTER — Telehealth: Payer: Medicare HMO

## 2020-03-27 ENCOUNTER — Ambulatory Visit (INDEPENDENT_AMBULATORY_CARE_PROVIDER_SITE_OTHER): Payer: Medicare HMO

## 2020-03-27 DIAGNOSIS — F419 Anxiety disorder, unspecified: Secondary | ICD-10-CM

## 2020-03-27 DIAGNOSIS — Z6836 Body mass index (BMI) 36.0-36.9, adult: Secondary | ICD-10-CM

## 2020-03-27 DIAGNOSIS — E1122 Type 2 diabetes mellitus with diabetic chronic kidney disease: Secondary | ICD-10-CM

## 2020-03-27 DIAGNOSIS — N183 Chronic kidney disease, stage 3 unspecified: Secondary | ICD-10-CM

## 2020-03-27 DIAGNOSIS — N1831 Chronic kidney disease, stage 3a: Secondary | ICD-10-CM

## 2020-03-27 DIAGNOSIS — I129 Hypertensive chronic kidney disease with stage 1 through stage 4 chronic kidney disease, or unspecified chronic kidney disease: Secondary | ICD-10-CM

## 2020-03-28 ENCOUNTER — Telehealth: Payer: Self-pay

## 2020-03-28 NOTE — Telephone Encounter (Signed)
I left the pt a message that the pt's tresiba and ozempic has been delivered from the pt's [employer] patient assistance program an dis ready for pickup

## 2020-03-29 ENCOUNTER — Telehealth: Payer: Self-pay

## 2020-03-29 NOTE — Chronic Care Management (AMB) (Signed)
Chronic Care Management   CCM RN Visit Note  03/27/2020 Name: Teresa Golden MRN: [id number] DOB: 07/08/1945  Subjective: Teresa Golden is a 74 y.o. year old female who is a primary care patient of Glendale Chard, MD. The care management team was consulted for assistance with disease management and care coordination needs.    Engaged with patient by telephone for follow up visit in response to provider referral for case management and/or care coordination services.   Consent to Services:  The patient was given information about Chronic Care Management services, agreed to services, and gave verbal consent prior to initiation of services.  Please see initial visit note for detailed documentation.   Patient agreed to services and verbal consent obtained.   Assessment: Review of patient past medical history, allergies, medications, health status, including review of consultants reports, laboratory and other test data, was performed as part of comprehensive evaluation and provision of chronic care management services.   SDOH (Social Determinants of Health) assessments and interventions performed:  No  CCM Care Plan  Allergies  Allergen Reactions  . Meloxicam Other (See Comments)    Fever; muscle aches; "flu-like" symptoms  . Other     Rose fever and hay fever     Outpatient Encounter Medications as of 03/27/2020  Medication Sig  . amLODipine (NORVASC) 5 MG tablet Take 1 tablet (5 mg total) by mouth daily.  . B Complex-C (B-COMPLEX WITH VITAMIN C) tablet Take 1 tablet by mouth daily.  . Biotin 10000 MCG TABS Take 1 tablet by mouth daily.  . carvedilol (COREG CR) 20 MG 24 hr capsule TAKE 1 CAPSULE DAILY  . Cholecalciferol 5000 units TABS Take 5,000 Units by mouth daily.  . Colchicine 0.6 MG CAPS TAKE 1 CAPSULE DAILY AS    NEEDED  . Cyanocobalamin (VITAMIN B-12) 2500 MCG SUBL Take 2,500 mcg by mouth daily.  . diclofenac Sodium (VOLTAREN) 1 % GEL APPLY 2 GRAMS TO AFFECTED AREA(S) 4 TIMES  DAILY AS NEEDED  . docusate sodium (COLACE) 100 MG capsule Take 1 capsule (100 mg total) by mouth 2 (two) times daily. (Patient not taking: Reported on 02/09/2020)  . famotidine (PEPCID) 20 MG tablet Take 20 mg by mouth daily. PRN  . gabapentin (NEURONTIN) 300 MG capsule Take 600 mg by mouth at bedtime. Pt taking 600 MG at bedtime  . glucose blood (ONETOUCH VERIO) test strip Use as instructed to check blood sugars 2 times per day dx: e11.65  . insulin degludec (TRESIBA FLEXTOUCH) 100 UNIT/ML SOPN FlexTouch Pen Inject 0.09 mLs (9 Units total) into the skin daily at 10 pm. (Patient taking differently: Inject 8 Units into the skin daily at 10 pm.)  . losartan (COZAAR) 100 MG tablet Take 1 tablet (100 mg total) by mouth daily.  . Multiple Vitamins-Minerals (MULTIVITAMIN WITH MINERALS) tablet Take 1 tablet by mouth daily. Women's 50+  . naproxen sodium (ANAPROX) 220 MG tablet Take 440 mg by mouth 2 (two) times daily as needed (pain). 2 tablets every night at bedtime  . OneTouch Delica Lancets 29H MISC Use as instructed to check blood sugars 2 times per day dx: e11.65  . OZEMPIC, 1 MG/DOSE, 2 MG/1.5ML SOPN INJECT INTO SKIN ONCE A WEEK (Patient taking differently: 1mg )  . simvastatin (ZOCOR) 10 MG tablet Take 1 tablet (10 mg total) by mouth daily.  [name] SYNTHROID 25 MCG tablet Take 0.5 tablet (12.5 mcg) by mouth on Mondays through Fridays, then take 1 tablet (25 mcg) by mouth on Saturdays  and Sundays.   No facility-administered encounter medications on file as of 03/27/2020.    Patient Active Problem List   Diagnosis Date Noted  . Type 2 diabetes mellitus with stage 3 chronic kidney disease, with long-term current use of insulin ([place]) 11/29/2019  . Panniculitis 09/30/2019  . Back pain 09/30/2019  . Hepatoma ([place]) 01/21/2019  . Other cirrhosis of liver ([place]) 07/05/2018  . Hypertensive nephropathy 07/05/2018  . Chronic renal disease, stage III ([place]) 07/05/2018  . Chronic left shoulder pain 07/05/2018  .  Hypothyroidism 02/06/2018  . Hepatitis C virus infection cured after antiviral drug therapy 02/04/2018  . Osteoarthritis of right knee 07/30/2017  . S/P laparoscopic sleeve gastrectomy July 2018 08/26/2016  . Preop cardiovascular exam 07/30/2016  . Dyspnea on exertion 07/30/2016  . Class 2 severe obesity due to excess calories with serious comorbidity and body mass index (BMI) of 39.0 to 39.9 in adult ([place]) 07/30/2016  . Bilateral lower extremity edema 07/30/2016    Conditions to be addressed/monitored:DM, CKD III, Hypertensive Nephropathy, Class 2 severe obesity, Anxiety   Care Plan : General Plan of Care (Adult)  Updates made by Lynne Logan, RN since 03/29/2020 12:00 AM    Problem: Quality of Life (General Plan of Care)     Long-Range Goal: Quality of Life Maintained   Start Date: 02/01/2020  Expected End Date: 05/01/2020  Recent Progress: On track  Priority: High  Note:   Current Barriers:   Ineffective Self Health Maintenance  Currently UNABLE TO independently self manage needs related to chronic health conditions.   Knowledge Deficits related to short term plan for care coordination needs and long term plans for chronic disease management needs Nurse Case Manager Clinical Goal(s):   Over the next 90 days, patient will work with care management team to address care coordination and chronic disease management needs related to Disease Management  Educational Needs  Care Coordination  Medication Management and Education  Psychosocial Support   Interventions:  03/27/20 inbound call completed with patient   Answered questions related to effective health maintenance  Provided active listening to patient and validated her concerns related to routine HIV screening for effective health maintenance and to ensure quality of life is optimal   Determined patient will discuss with PCP at next scheduled visit, recommendations for HIV testing  Patient Goals/Self Care/Activities:   Over the next 90 days, patient will  - discuss my treatment options with the doctor or nurse - learn something new by asking, reading and searching the Internet every day - make shared treatment decisions with doctor  - increase water intake to 64oz-80oz per day - complete full course of antibiotics as directed  - contact PCP for reoccurring symptoms   Follow Up Plan: Telephone follow up appointment with care management team member scheduled for: 04/19/20     Plan:Telephone follow up appointment with care management team member scheduled for:  04/19/20   Barb Merino, RN, BSN, CCM Care Management Coordinator [place] [facility]  Direct Phone: [phone]

## 2020-03-29 NOTE — Progress Notes (Deleted)
Cardiology Office Note:    Date:  03/29/2020   ID:  Teresa Golden, DOB May 20, 1945, MRN [id number]  PCP:  Glendale Chard, MD  Cardiologist:  No primary care provider on file.  Electrophysiologist:  None   Referring MD: Glendale Chard, MD   Chief Complaint/Reason for Referral: RBBB, SOB  History of Present Illness:    Teresa Golden is a 75 y.o. female with a history of DM2, CKD, HTN, GERD, hypothyroidism who presents for evaluation of RBBB.   Low risk stress test in 2018 performed for surgical clearance. No ischemia. Peak BP 211/74 mmhg.   New RBBB on ECG 02/09/20, with report of SOB. NSR with normal QRS duration on ECG 01/17/2019.   Past Medical History:  Diagnosis Date  . Arthritis   . Chronic kidney disease    self reports ckd stage 3   . Diabetes mellitus, type II, insulin dependent ([place])    With neurologic complications. Bilateral lower extremity peripheral neuropathy  . Dyspnea    with excertion; no issues now since weight loss surgery   . Endometriosis   . Essential hypertension   . GERD (gastroesophageal reflux disease)   . 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 Hypertensive nephropathy 07/05/2018  . Hypothyroidism   . Hypothyroidism 02/06/2018  . Morbid obesity with BMI of 50.0-59.9, adult ([place])   . Scoliosis     Past Surgical History:  Procedure Laterality Date  . ABDOMINAL HYSTERECTOMY     uterus  . CHOLECYSTECTOMY    . EYE SURGERY     cataract extraction bilateral   . JOINT REPLACEMENT     Left knee  . KNEE ARTHROPLASTY Right 07/30/2017   Procedure: RIGHT TOTAL KNEE ARTHROPLASTY WITH COMPUTER NAVIGATION;  Surgeon: Rod Can, MD;  Location: [facility];  Service: Orthopedics;  Laterality: Right;  Needs RNFA  . LAPAROSCOPIC GASTRIC BANDING     and reversal  . LAPAROSCOPIC GASTRIC SLEEVE RESECTION N/A 08/26/2016   Procedure: LAPAROSCOPIC GASTRIC SLEEVE RESECTION WITH UPPER ENOD;  Surgeon: Johnathan Hausen, MD;  Location:  [facility];  Service: General;  Laterality: N/A;  . TONSILLECTOMY    . TRANSTHORACIC ECHOCARDIOGRAM  11/2013   EF 65-70%. Normal diastolic Fxn.  Normal Valves.    Current Medications: No outpatient medications have been marked as taking for the 03/30/20 encounter (Appointment) with Elouise Munroe, MD.     Allergies:   Meloxicam and Other   Social History   Tobacco Use  . Smoking status: Former Smoker    Packs/day: 0.25    Years: 10.00    Pack years: 2.50    Types: Cigarettes  . Smokeless tobacco: Never Used  . Tobacco comment: quit 20 years ago  Vaping Use  . Vaping Use: Never used  Substance Use Topics  . Alcohol use: Not Currently    Alcohol/week: 1.0 standard drink    Types: 1 Glasses of wine per week    Comment: occasional  . Drug use: No     Family History: The patient's family history includes Heart attack in her mother; Heart failure in her mother; Stroke in her father.  ROS:   Please see the history of present illness.    All other systems reviewed and are negative.  EKGs/Labs/Other Studies Reviewed:    The following studies were reviewed today:  EKG:  ***  I have independently reviewed the images from ***.  Recent Labs: 11/29/2019: ALT 30; BUN 18;  Creatinine, Ser 0.96; Potassium 4.2; Sodium 145 02/09/2020: Hemoglobin 11.3; Platelets 223  Recent Lipid Panel    Component Value Date/Time   CHOL 150 02/09/2020 1443   TRIG 55 02/09/2020 1443   HDL 61 02/09/2020 1443   CHOLHDL 2.5 02/09/2020 1443   LDLCALC 77 02/09/2020 1443    Physical Exam:    VS:  There were no vitals taken for this visit.    Wt Readings from Last 5 Encounters:  02/14/20 212 lb 9.6 oz (96.4 kg)  02/09/20 212 lb (96.2 kg)  01/06/20 216 lb (98 kg)  11/29/19 208 lb (94.3 kg)  11/29/19 209 lb (94.8 kg)    Constitutional: No acute distress Eyes: sclera non-icteric, normal conjunctiva and lids ENMT: normal dentition, moist mucous membranes Cardiovascular: regular rhythm, normal  rate, no murmurs. S1 and S2 normal. Radial pulses normal bilaterally. No jugular venous distention.  Respiratory: clear to auscultation bilaterally GI : normal bowel sounds, soft and nontender. No distention.   MSK: extremities warm, well perfused. No edema.  NEURO: grossly nonfocal exam, moves all extremities. PSYCH: alert and oriented x 3, normal mood and affect.   ASSESSMENT:    No diagnosis found. PLAN:    No diagnosis found.  Total time of encounter: *** minutes total time of encounter, including *** minutes spent in face-to-face patient care on the date of this encounter. This time includes coordination of care and counseling regarding above mentioned problem list. Remainder of non-face-to-face time involved reviewing chart documents/testing relevant to the patient encounter and documentation in the medical record. I have independently reviewed documentation from referring provider.   Cherlynn Kaiser, MD [place]  [facility]    Medication Adjustments/Labs and Tests Ordered: Current medicines are reviewed at length with the patient today.  Concerns regarding medicines are outlined above.   No orders of the defined types were placed in this encounter.   Shared Decision Making/Informed Consent:   {Are you ordering a CV Procedure (e.g. stress test, cath, DCCV, TEE, etc)?   Press F2        :[id number]}   No orders of the defined types were placed in this encounter.   There are no Patient Instructions on file for this visit.

## 2020-03-29 NOTE — Patient Instructions (Signed)
Goals Addressed    . [place] My Quality of Life   On track    Timeframe:  Long-Range Goal Priority:  High Start Date: 02/01/20                           Expected End Date: 05/01/20                    Follow Up Date 04/19/20 - discuss my treatment options with the doctor or nurse - learn something new by asking, reading and searching the Internet every day - make shared treatment decisions with doctor  - increase water intake to 64oz-80oz per day - complete full course of antibiotics as directed  - contact PCP for reoccurring symptoms   Why is this important?    Having a long-term illness can be scary.   It can also be stressful for you and your caregiver.   These steps may help.    Notes:

## 2020-03-29 NOTE — Chronic Care Management (AMB) (Signed)
Chronic Care Management Pharmacy Assistant   Name: SARANDA LEGRANDE  MRN: [id number] DOB: 03-11-1945  Reason for Encounter: Diabetes Adherence Call/Patient Assistance Coordination.  Patient Questions:  1.  Have you seen any other providers since your last visit? Yes, 03/27/2020-Little, Claudette Stapler, RN (CCM).    2.  Any changes in your medicines or health? No   .  PCP : Glendale Chard, MD  Allergies:   Allergies  Allergen Reactions   Meloxicam Other (See Comments)    Fever; muscle aches; "flu-like" symptoms   Other     Rose fever and hay fever     Medications: Outpatient Encounter Medications as of 03/29/2020  Medication Sig   amLODipine (NORVASC) 5 MG tablet Take 1 tablet (5 mg total) by mouth daily.   B Complex-C (B-COMPLEX WITH VITAMIN C) tablet Take 1 tablet by mouth daily.   Biotin 10000 MCG TABS Take 1 tablet by mouth daily.   carvedilol (COREG CR) 20 MG 24 hr capsule TAKE 1 CAPSULE DAILY   Cholecalciferol 5000 units TABS Take 5,000 Units by mouth daily.   Colchicine 0.6 MG CAPS TAKE 1 CAPSULE DAILY AS    NEEDED   Cyanocobalamin (VITAMIN B-12) 2500 MCG SUBL Take 2,500 mcg by mouth daily.   diclofenac Sodium (VOLTAREN) 1 % GEL APPLY 2 GRAMS TO AFFECTED AREA(S) 4 TIMES DAILY AS NEEDED   docusate sodium (COLACE) 100 MG capsule Take 1 capsule (100 mg total) by mouth 2 (two) times daily. (Patient not taking: Reported on 02/09/2020)   famotidine (PEPCID) 20 MG tablet Take 20 mg by mouth daily. PRN   gabapentin (NEURONTIN) 300 MG capsule Take 600 mg by mouth at bedtime. Pt taking 600 MG at bedtime   glucose blood (ONETOUCH VERIO) test strip Use as instructed to check blood sugars 2 times per day dx: e11.65   insulin degludec (TRESIBA FLEXTOUCH) 100 UNIT/ML SOPN FlexTouch Pen Inject 0.09 mLs (9 Units total) into the skin daily at 10 pm. (Patient taking differently: Inject 8 Units into the skin daily at 10 pm.)   losartan (COZAAR) 100 MG tablet Take 1 tablet (100 mg  total) by mouth daily.   Multiple Vitamins-Minerals (MULTIVITAMIN WITH MINERALS) tablet Take 1 tablet by mouth daily. Women's 50+   naproxen sodium (ANAPROX) 220 MG tablet Take 440 mg by mouth 2 (two) times daily as needed (pain). 2 tablets every night at bedtime   OneTouch Delica Lancets 87F MISC Use as instructed to check blood sugars 2 times per day dx: e11.65   OZEMPIC, 1 MG/DOSE, 2 MG/1.5ML SOPN INJECT INTO SKIN ONCE A WEEK (Patient taking differently: 1mg )   simvastatin (ZOCOR) 10 MG tablet Take 1 tablet (10 mg total) by mouth daily.   SYNTHROID 25 MCG tablet Take 0.5 tablet (12.5 mcg) by mouth on Mondays through Fridays, then take 1 tablet (25 mcg) by mouth on Saturdays and Sundays.   No facility-administered encounter medications on file as of 03/29/2020.    Current Diagnosis: Patient Active Problem List   Diagnosis Date Noted   Type 2 diabetes mellitus with stage 3 chronic kidney disease, with long-term current use of insulin ([place]) 11/29/2019   Panniculitis 09/30/2019   Back pain 09/30/2019   Hepatoma ([place]) 01/21/2019   Other cirrhosis of liver ([place]) 07/05/2018   Hypertensive nephropathy 07/05/2018   Chronic renal disease, stage III ([place]) 07/05/2018   Chronic left shoulder pain 07/05/2018   Hypothyroidism 02/06/2018   Hepatitis C virus infection cured after antiviral drug therapy 02/04/2018  Osteoarthritis of right knee 07/30/2017   S/P laparoscopic sleeve gastrectomy July 2018 08/26/2016   Preop cardiovascular exam 07/30/2016   Dyspnea on exertion 07/30/2016   Class 2 severe obesity due to excess calories with serious comorbidity and body mass index (BMI) of 39.0 to 39.9 in adult ([place]) 07/30/2016   Bilateral lower extremity edema 07/30/2016   Recent Relevant Labs: Lab Results  Component Value Date/Time   HGBA1C 6.7 (H) 11/29/2019 02:11 PM   HGBA1C 6.5 (H) 08/18/2019 02:10 PM   MICROALBUR 10 02/09/2020 10:59 AM   MICROALBUR 10 01/17/2019 06:09 PM     Kidney Function Lab Results  Component Value Date/Time   CREATININE 0.96 11/29/2019 02:11 PM   CREATININE 0.83 08/18/2019 02:10 PM   GFRNONAA 58 (L) 11/29/2019 02:11 PM   GFRAA 67 11/29/2019 02:11 PM     Current antihyperglycemic regimen:   Tresiba 100 units/mL 8 units daily   Ozempic 1mg  weekly   What recent interventions/DTPs have been made to improve glycemic control:  o Patient stated she picked up a delivered shipment on 03/28/20 at PCP's office from the pharmaceutical manufacturer for [place].  o Patient reports she is taking her medications as prescribed.   Have there been any recent hospitalizations or ED visits since last visit with CPP? No    Patient denies hypoglycemic symptoms, including Pale, Sweaty, Shaky, Hungry, Nervous/irritable and Vision changes    Patient denies hyperglycemic symptoms, including blurry vision, excessive thirst, fatigue, polyuria and weakness    How often are you checking your blood sugar? twice daily, in the morning before eating or drinking and at bedtime   What are your blood sugars ranging?  o Fasting: 105 am o Before meals: none o After meals: none o Bedtime: none   During the week, how often does your blood glucose drop below 70? Never    Are you checking your feet daily/regularly? Patient states she checks her feet daily but has a concern about a raised lump on left side of the left foot. No pain was indicated.  Adherence Review: Is the patient currently on a STATIN medication? Yes   Is the patient currently on ACE/ARB medication? Yes   Does the patient have >5 day gap between last estimated fill dates? No    Goals Addressed   None     Follow-Up:  Patient Assistance Coordination and Pharmacist Review-Patient stated she picked up a delivered shipment of Tresiba and Ozempic on 03/28/20 at PCP's office.   Patient reported she is seeking assistance for her Carvedilol (COREG CR) 20 MG 24 hr capsule. Patient  voiced she spoke with [name], CPP regarding this matter of the Rx cost of $160/90D she is left to pay due to her insurance carrier raising the Carvedilol to a higher Tier in the formulary and was told someone will try to help her with this cost barrier. The patient stated she has 60D left of her Carvedilol before her next refill. I told the patient I will need to reach out to team staff and get clarification on this matter. I also asked the patient to confirm over telephone call if she is receiving [facility]; patient confirmed yes.  Teams Message to [facility] regarding this concern. Per Felicity Coyer; with the information given we will coordinate patient assistance through Rx Outreach program and see if the patient is eligible for assistance.  Called the patient back to notify her we are in the process of coordinating  patient assistance through Rx [employer] and will contact her with any further updates. The patient voiced understanding.  [name], CPP Notified.  Raynelle Highland, [place] Pharmacist Assistant [phone]

## 2020-03-30 ENCOUNTER — Encounter: Payer: Self-pay | Admitting: Internal Medicine

## 2020-03-30 ENCOUNTER — Ambulatory Visit: Payer: Medicare HMO | Admitting: Internal Medicine

## 2020-03-30 ENCOUNTER — Encounter: Payer: Self-pay | Admitting: Nurse Practitioner

## 2020-03-30 ENCOUNTER — Ambulatory Visit (INDEPENDENT_AMBULATORY_CARE_PROVIDER_SITE_OTHER): Payer: Medicare HMO | Admitting: Nurse Practitioner

## 2020-03-30 VITALS — BP 130/70 | HR 69 | Temp 98.1°F | Ht 66.0 in | Wt 212.6 lb

## 2020-03-30 DIAGNOSIS — R059 Cough, unspecified: Secondary | ICD-10-CM | POA: Diagnosis not present

## 2020-03-30 DIAGNOSIS — Z1152 Encounter for screening for COVID-19: Secondary | ICD-10-CM

## 2020-03-30 LAB — POC COVID19 BINAXNOW: SARS Coronavirus 2 Ag: NEGATIVE

## 2020-03-30 NOTE — Patient Instructions (Addendum)
COVID-19 Quarantine vs. Isolation QUARANTINE keeps someone who was in close contact with someone who has COVID-19 away from others. Quarantine if you have been in close contact with someone who has COVID-19, unless you have been fully vaccinated. If you are fully vaccinated  You do NOT need to quarantine unless they have symptoms  Get tested 3-5 days after your exposure, even if you don't have symptoms  Wear a mask indoors in public for 14 days following exposure or until your test result is negative If you are not fully vaccinated  Stay home for 14 days after your last contact with a person who has COVID-19  Watch for fever (100.42F), cough, shortness of breath, or other symptoms of COVID-19  If possible, stay away from people you live with, especially people who are at higher risk for getting very sick from COVID-19  Contact your local public health department for options in your area to possibly shorten your quarantine ISOLATION keeps someone who is sick or tested positive for COVID-19 without symptoms away from others, even in their own home. People who are in isolation should stay home and stay in a specific "sick room" or area and use a separate bathroom (if available). If you are sick and think or know you have COVID-19 Stay home until after  At least 10 days since symptoms first appeared and  At least 24 hours with no fever without the use of fever-reducing medications and  Symptoms have improved If you tested positive for COVID-19 but do not have symptoms  Stay home until after 10 days have passed since your positive viral test  If you develop symptoms after testing positive, follow the steps above for those who are sick [email or web address] 11/21/2019 This information is not intended to replace advice given to you by your health care provider. Make sure you discuss any questions you have with your health care provider. Document Revised: 12/26/2019 Document Reviewed:  12/26/2019 Elsevier Patient Education  2021 [place] can take HBP [place] brand medications for symptom management If you have shortness of breath or chest pain go to ER for further evaluation

## 2020-03-30 NOTE — Progress Notes (Signed)
I,Yamilka Roman [employer] as a Education administrator for [employer], FNP.,have documented all relevant documentation on the behalf of Minette Brine, FNP,as directed by  Minette Brine, FNP while in the presence of Minette Brine, [place]. This visit occurred during the SARS-CoV-2 public health emergency.  Safety protocols were in place, including screening questions prior to the visit, additional usage of staff PPE, and extensive cleaning of exam room while observing appropriate contact time as indicated for disinfecting solutions.  Subjective:     [name] , female    DOB: December 06, 1945 , 75 y.o.   MRN: [id number]   Chief Complaint  Patient presents with  . URI    Patient stated she started feeling sick yesterday. She has a cough,nasal and nasal congestion and chills. She denies having a fever.    HPI  Patient here for cold symptoms that started yesterday. She had watery eyes, nasal congestion and coughing.  Occasionally will feel like has to cough up clear phlegm, she does report this morning was not clear.  Denies fever.     URI  This is a new problem. The current episode started yesterday. There has been no fever. Associated symptoms include congestion and coughing (slight clear phlegm). Pertinent negatives include no chest pain, ear pain, headaches, sinus pain or wheezing. Associated symptoms comments: Watery eyes and has some aching to her back. .     Past Medical History:  Diagnosis Date  . Arthritis   . Chronic kidney disease    self reports ckd stage 3   . Diabetes mellitus, type II, insulin dependent ([place])    With neurologic complications. Bilateral lower extremity peripheral neuropathy  . Dyspnea    with excertion; no issues now since weight loss surgery   . Endometriosis   . Essential hypertension   . GERD (gastroesophageal reflux disease)   . 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 Hypertensive nephropathy 07/05/2018  .  Hypothyroidism   . Hypothyroidism 02/06/2018  . Morbid obesity with BMI of 50.0-59.9, adult ([place])   . Scoliosis      Family History  Problem Relation Age of Onset  . Heart attack Mother   . Heart failure Mother   . Stroke Father      Current Outpatient Medications:  .  amLODipine (NORVASC) 5 MG tablet, Take 1 tablet (5 mg total) by mouth daily., Disp: 90 tablet, Rfl: 1 .  B Complex-C (B-COMPLEX WITH VITAMIN C) tablet, Take 1 tablet by mouth daily., Disp: , Rfl:  .  Biotin 10000 MCG TABS, Take 1 tablet by mouth daily., Disp: , Rfl:  .  carvedilol (COREG CR) 20 MG 24 hr capsule, TAKE 1 CAPSULE DAILY, Disp: 90 capsule, Rfl: 2 .  Cholecalciferol 5000 units TABS, Take 5,000 Units by mouth daily., Disp: , Rfl:  .  Colchicine 0.6 MG CAPS, TAKE 1 CAPSULE DAILY AS    NEEDED, Disp: 90 capsule, Rfl: 1 .  Cyanocobalamin (VITAMIN B-12) 2500 MCG SUBL, Take 2,500 mcg by mouth daily., Disp: , Rfl:  .  diclofenac Sodium (VOLTAREN) 1 % GEL, APPLY 2 GRAMS TO AFFECTED AREA(S) 4 TIMES DAILY AS NEEDED, Disp: 200 g, Rfl: 2 .  docusate sodium (COLACE) 100 MG capsule, Take 1 capsule (100 mg total) by mouth 2 (two) times daily., Disp: 60 capsule, Rfl: 1 .  famotidine (PEPCID) 20 MG tablet, Take 20 mg by mouth daily. PRN, Disp: , Rfl:  .  gabapentin (  NEURONTIN) 300 MG capsule, Take 600 mg by mouth at bedtime. Pt taking 600 MG at bedtime, Disp: , Rfl:  .  glucose blood (ONETOUCH VERIO) test strip, Use as instructed to check blood sugars 2 times per day dx: e11.65, Disp: 300 each, Rfl: 2 .  insulin degludec (TRESIBA FLEXTOUCH) 100 UNIT/ML SOPN FlexTouch Pen, Inject 0.09 mLs (9 Units total) into the skin daily at 10 pm. (Patient taking differently: Inject 8 Units into the skin daily at 10 pm.), Disp: 5 pen, Rfl: 2 .  losartan (COZAAR) 100 MG tablet, Take 1 tablet (100 mg total) by mouth daily., Disp: 90 tablet, Rfl: 2 .  Multiple Vitamins-Minerals (MULTIVITAMIN WITH MINERALS) tablet, Take 1 tablet by mouth daily.  Women's 50+, Disp: , Rfl:  .  naproxen sodium (ANAPROX) 220 MG tablet, Take 440 mg by mouth 2 (two) times daily as needed (pain). 2 tablets every night at bedtime, Disp: , Rfl:  .  OneTouch Delica Lancets 93X MISC, Use as instructed to check blood sugars 2 times per day dx: e11.65, Disp: 300 each, Rfl: 2 .  OZEMPIC, 1 MG/DOSE, 2 MG/1.5ML SOPN, INJECT INTO SKIN ONCE A WEEK (Patient taking differently: 1mg ), Disp: 9 pen, Rfl: 1 .  simvastatin (ZOCOR) 10 MG tablet, Take 1 tablet (10 mg total) by mouth daily., Disp: 90 tablet, Rfl: 1 .  SYNTHROID 25 MCG tablet, Take 0.5 tablet (12.5 mcg) by mouth on Mondays through Fridays, then take 1 tablet (25 mcg) by mouth on Saturdays and Sundays., Disp: 90 tablet, Rfl: 1   Allergies  Allergen Reactions  . Meloxicam Other (See Comments)    Fever; muscle aches; "flu-like" symptoms  . Other     Rose fever and hay fever      Review of Systems  Constitutional: Positive for chills. Negative for fever.  HENT: Positive for congestion. Negative for ear pain and sinus pain.   Eyes: Negative.   Respiratory: Positive for cough (slight clear phlegm). Negative for wheezing.   Cardiovascular: Negative.  Negative for chest pain, palpitations and leg swelling.  Gastrointestinal: Negative.   Endocrine: Negative.   Genitourinary: Negative.   Musculoskeletal: Negative.   Skin: Negative.   Neurological: Negative.  Negative for dizziness and headaches.  Hematological: Negative.   Psychiatric/Behavioral: Negative.      Today's Vitals   03/30/20 1115  BP: 130/70  Pulse: 69  Temp: 98.1 F (36.7 C)  TempSrc: Oral  Weight: 212 lb 9.6 oz (96.4 kg)  Height: 5\' 6"  (1.676 m)  PainSc: 0-No pain   Body mass index is 34.31 kg/m.   Objective:  Physical Exam Constitutional:      General: She is not in acute distress.    Appearance: Normal appearance. She is obese.  Cardiovascular:     Rate and Rhythm: Normal rate and regular rhythm.     Pulses: Normal pulses.      Heart sounds: Normal heart sounds. No murmur heard.   Pulmonary:     Effort: Pulmonary effort is normal. No respiratory distress.     Breath sounds: Normal breath sounds. No wheezing.  Abdominal:     General: Abdomen is flat. Bowel sounds are normal. There is no distension.     Palpations: Abdomen is soft.     Tenderness: There is no abdominal tenderness.  Musculoskeletal:        General: Normal range of motion.     Cervical back: Normal range of motion and neck supple.  Skin:    General: Skin is  warm and dry.     Capillary Refill: Capillary refill takes less than 2 seconds.     Coloration: Skin is not jaundiced.  Neurological:     General: No focal deficit present.     Mental Status: She is alert and oriented to person, place, and time.  Psychiatric:        Mood and Affect: Mood normal.        Behavior: Behavior normal.        Thought Content: Thought content normal.        Judgment: Judgment normal.         Assessment And Plan:     1. Cough  She can take over the counter delsym for cough  Will await PCR covid test and is to remain in isolation until results  If has shortness of breath or chest pain to go to ER   2. Encounter for screening for COVID-19 - POC COVID-19 - Novel Coronavirus, NAA ([facility])     Patient was given opportunity to ask questions. Patient verbalized understanding of the plan and was able to repeat key elements of the plan. All questions were answered to their satisfaction.  Minette Brine, FNP   I, Minette Brine, FNP, have reviewed all documentation for this visit. The documentation on 03/30/20 for the exam, diagnosis, procedures, and orders are all accurate and complete.   THE PATIENT IS ENCOURAGED TO PRACTICE SOCIAL DISTANCING DUE TO THE COVID-19 PANDEMIC.

## 2020-03-31 LAB — SARS-COV-2, NAA 2 DAY TAT

## 2020-03-31 LAB — NOVEL CORONAVIRUS, NAA: SARS-CoV-2, NAA: DETECTED — AB

## 2020-04-01 ENCOUNTER — Other Ambulatory Visit: Payer: Self-pay | Admitting: Physician Assistant

## 2020-04-01 DIAGNOSIS — E1122 Type 2 diabetes mellitus with diabetic chronic kidney disease: Secondary | ICD-10-CM

## 2020-04-01 DIAGNOSIS — N183 Chronic kidney disease, stage 3 unspecified: Secondary | ICD-10-CM

## 2020-04-01 DIAGNOSIS — I10 Essential (primary) hypertension: Secondary | ICD-10-CM

## 2020-04-01 DIAGNOSIS — Z794 Long term (current) use of insulin: Secondary | ICD-10-CM

## 2020-04-01 DIAGNOSIS — U071 COVID-19: Secondary | ICD-10-CM

## 2020-04-01 NOTE — Progress Notes (Signed)
I connected by phone with [name] on 04/01/2020 at 9:50 AM to discuss the potential use of a new treatment for mild to moderate COVID-19 viral infection in non-hospitalized patients.  This patient is a 75 y.o. female that meets the FDA criteria for Emergency Use Authorization of COVID monoclonal antibody sotrovimab.  Has a (+) direct SARS-CoV-2 viral test result  Has mild or moderate COVID-19   Is NOT hospitalized due to COVID-19  Is within 10 days of symptom onset  Has at least one of the high risk factor(s) for progression to severe COVID-19 and/or hospitalization as defined in EUA.  Specific high risk criteria : Older age (>/= 76 yo), BMI > 25, Chronic Kidney Disease (CKD), Diabetes and Cardiovascular disease or hypertension   I have spoken and communicated the following to the patient or parent/caregiver regarding COVID monoclonal antibody treatment:  1. FDA has authorized the emergency use for the treatment of mild to moderate COVID-19 in adults and pediatric patients with positive results of direct SARS-CoV-2 viral testing who are 80 years of age and older weighing at least 40 kg, and who are at high risk for progressing to severe COVID-19 and/or hospitalization.  2. The significant known and potential risks and benefits of COVID monoclonal antibody, and the extent to which such potential risks and benefits are unknown.  3. Information on available alternative treatments and the risks and benefits of those alternatives, including clinical trials.  4. Patients treated with COVID monoclonal antibody should continue to self-isolate and use infection control measures (e.g., wear mask, isolate, social distance, avoid sharing personal items, clean and disinfect "high touch" surfaces, and frequent handwashing) according to CDC guidelines.   5. The patient or parent/caregiver has the option to accept or refuse COVID monoclonal antibody treatment.  After reviewing this information with  the patient, the patient has agreed to receive one of the available covid 19 monoclonal antibodies and will be provided an appropriate fact sheet prior to infusion.   [place], [place] 04/01/2020 9:50 AM

## 2020-04-02 ENCOUNTER — Encounter: Payer: Self-pay | Admitting: Internal Medicine

## 2020-04-02 ENCOUNTER — Ambulatory Visit (HOSPITAL_COMMUNITY)
Admission: RE | Admit: 2020-04-02 | Discharge: 2020-04-02 | Disposition: A | Discharge status: Home / Self Care | Payer: Medicare HMO | Source: Ambulatory Visit | Attending: Pulmonary Disease | Admitting: Pulmonary Disease

## 2020-04-02 ENCOUNTER — Other Ambulatory Visit: Payer: Self-pay | Admitting: Nurse Practitioner

## 2020-04-02 DIAGNOSIS — U071 COVID-19: Secondary | ICD-10-CM | POA: Diagnosis present

## 2020-04-02 DIAGNOSIS — E1122 Type 2 diabetes mellitus with diabetic chronic kidney disease: Secondary | ICD-10-CM

## 2020-04-02 DIAGNOSIS — N1831 Chronic kidney disease, stage 3a: Secondary | ICD-10-CM

## 2020-04-02 DIAGNOSIS — I129 Hypertensive chronic kidney disease with stage 1 through stage 4 chronic kidney disease, or unspecified chronic kidney disease: Secondary | ICD-10-CM

## 2020-04-02 MED ORDER — METHYLPREDNISOLONE SODIUM SUCC 125 MG IJ SOLR: 125.0000 mg | Freq: Once | INTRAMUSCULAR | Status: DC | PRN

## 2020-04-02 MED ORDER — ALBUTEROL SULFATE HFA 108 (90 BASE) MCG/ACT IN AERS: 2.0000 | INHALATION_SPRAY | Freq: Once | RESPIRATORY_TRACT | Status: DC | PRN

## 2020-04-02 MED ORDER — SOTROVIMAB 500 MG/8ML IV SOLN
500.0000 mg | Freq: Once | INTRAVENOUS | Status: AC
  Administered 2020-04-02: 500 mg via INTRAVENOUS

## 2020-04-02 MED ORDER — FAMOTIDINE IN NACL 20-0.9 MG/50ML-% IV SOLN: 20.0000 mg | Freq: Once | INTRAVENOUS | Status: DC | PRN

## 2020-04-02 MED ORDER — SODIUM CHLORIDE 0.9 % IV SOLN: INTRAVENOUS | Status: DC | PRN

## 2020-04-02 MED ORDER — DIPHENHYDRAMINE HCL 50 MG/ML IJ SOLN: 50.0000 mg | Freq: Once | INTRAMUSCULAR | Status: DC | PRN

## 2020-04-02 MED ORDER — EPINEPHRINE 0.3 MG/0.3ML IJ SOAJ: 0.3000 mg | Freq: Once | INTRAMUSCULAR | Status: DC | PRN

## 2020-04-02 NOTE — Progress Notes (Signed)
Diagnosis: COVID-19  Physician: Dr. Patrick Wright  Procedure: [facility] Infusion [facility] Med: Sotrovimab infusion - Provided patient with sotrovimab fact sheet for patients, parents, and caregivers prior to infusion.   Complications: No immediate complications noted  Discharge: Discharged home    

## 2020-04-02 NOTE — Discharge Instructions (Signed)

## 2020-04-02 NOTE — Progress Notes (Signed)
Patient reviewed Fact Sheet for Patients, Parents, and Caregivers for Emergency Use Authorization (EUA) of sotrovimab for the Treatment of Coronavirus. Patient also reviewed and is agreeable to the estimated cost of treatment. Patient is agreeable to proceed.   

## 2020-04-03 ENCOUNTER — Ambulatory Visit: Payer: Self-pay

## 2020-04-03 ENCOUNTER — Telehealth: Payer: Medicare HMO

## 2020-04-03 ENCOUNTER — Encounter: Payer: Self-pay | Admitting: Nurse Practitioner

## 2020-04-03 DIAGNOSIS — N183 Chronic kidney disease, stage 3 unspecified: Secondary | ICD-10-CM | POA: Diagnosis not present

## 2020-04-03 DIAGNOSIS — F419 Anxiety disorder, unspecified: Secondary | ICD-10-CM

## 2020-04-03 DIAGNOSIS — U071 COVID-19: Secondary | ICD-10-CM

## 2020-04-03 DIAGNOSIS — E1122 Type 2 diabetes mellitus with diabetic chronic kidney disease: Secondary | ICD-10-CM | POA: Diagnosis not present

## 2020-04-03 DIAGNOSIS — E039 Hypothyroidism, unspecified: Secondary | ICD-10-CM | POA: Diagnosis not present

## 2020-04-03 DIAGNOSIS — I129 Hypertensive chronic kidney disease with stage 1 through stage 4 chronic kidney disease, or unspecified chronic kidney disease: Secondary | ICD-10-CM

## 2020-04-03 DIAGNOSIS — N1831 Chronic kidney disease, stage 3a: Secondary | ICD-10-CM | POA: Diagnosis not present

## 2020-04-04 ENCOUNTER — Telehealth: Payer: Self-pay

## 2020-04-04 ENCOUNTER — Ambulatory Visit: Payer: Self-pay

## 2020-04-04 ENCOUNTER — Telehealth: Payer: Medicare HMO

## 2020-04-04 DIAGNOSIS — N1831 Chronic kidney disease, stage 3a: Secondary | ICD-10-CM

## 2020-04-04 DIAGNOSIS — U071 COVID-19: Secondary | ICD-10-CM

## 2020-04-04 DIAGNOSIS — E1122 Type 2 diabetes mellitus with diabetic chronic kidney disease: Secondary | ICD-10-CM

## 2020-04-04 NOTE — Patient Instructions (Signed)
Goals Addressed    . COVID 19 Infection - Complications minimized or prevented   On track    Timeframe:  Short-Term Goal Priority:  High Start Date:  04/03/20                           Expected End Date:  05/01/20  Follow Up Date: 05/01/20  Keep all scheduled follow up appointments for COVID recheck as directed Stay well hydrated and take all prescribed medications as directed Notify PCP provider for new or worsening symptoms related to COVID 19 Seek medication attention right away for unresolved chest pain or shortness of breath

## 2020-04-04 NOTE — Chronic Care Management (AMB) (Signed)
Chronic Care Management    Social Work Note  04/04/2020 Name: Teresa Golden MRN: [id number] DOB: 05-29-1945  [name] is a 75 y.o. year old female who is a primary care patient of Teresa Chard, MD. The CCM team was consulted to assist the patient with chronic disease management and/or care coordination needs related to: Transportation Needs .   Engaged with patient by telephone for follow up visit in response to provider referral for social work chronic care management and care coordination services.   Consent to Services:  The patient was given information about Chronic Care Management services, agreed to services, and gave verbal consent prior to initiation of services.  Please see initial visit note for detailed documentation.   Patient agreed to services and consent obtained.   Assessment: Review of patient past medical history, allergies, medications, and health status, including review of relevant consultants reports was performed today as part of a comprehensive evaluation and provision of chronic care management and care coordination services.     SDOH (Social Determinants of Health) assessments and interventions performed:    Advanced Directives Status: Not addressed in this encounter.  CCM Care Plan  Allergies  Allergen Reactions  . Meloxicam Other (See Comments)    Fever; muscle aches; "flu-like" symptoms  . Other     Rose fever and hay fever     Outpatient Encounter Medications as of 04/04/2020  Medication Sig  . amLODipine (NORVASC) 5 MG tablet Take 1 tablet (5 mg total) by mouth daily.  . B Complex-C (B-COMPLEX WITH VITAMIN C) tablet Take 1 tablet by mouth daily.  . Biotin 10000 MCG TABS Take 1 tablet by mouth daily.  . carvedilol (COREG CR) 20 MG 24 hr capsule TAKE 1 CAPSULE DAILY  . Cholecalciferol 5000 units TABS Take 5,000 Units by mouth daily.  . Colchicine 0.6 MG CAPS TAKE 1 CAPSULE DAILY AS    NEEDED  . Cyanocobalamin (VITAMIN B-12) 2500 MCG SUBL Take  2,500 mcg by mouth daily.  . diclofenac Sodium (VOLTAREN) 1 % GEL APPLY 2 GRAMS TO AFFECTED AREA(S) 4 TIMES DAILY AS NEEDED  . docusate sodium (COLACE) 100 MG capsule Take 1 capsule (100 mg total) by mouth 2 (two) times daily. (Patient not taking: Reported on 02/09/2020)  . famotidine (PEPCID) 20 MG tablet Take 20 mg by mouth daily. PRN  . gabapentin (NEURONTIN) 300 MG capsule Take 600 mg by mouth at bedtime. Pt taking 600 MG at bedtime  . glucose blood (ONETOUCH VERIO) test strip Use as instructed to check blood sugars 2 times per day dx: e11.65  . insulin degludec (TRESIBA FLEXTOUCH) 100 UNIT/ML SOPN FlexTouch Pen Inject 0.09 mLs (9 Units total) into the skin daily at 10 pm. (Patient taking differently: Inject 8 Units into the skin daily at 10 pm.)  . losartan (COZAAR) 100 MG tablet Take 1 tablet (100 mg total) by mouth daily.  . Multiple Vitamins-Minerals (MULTIVITAMIN WITH MINERALS) tablet Take 1 tablet by mouth daily. Women's 50+  . naproxen sodium (ANAPROX) 220 MG tablet Take 440 mg by mouth 2 (two) times daily as needed (pain). 2 tablets every night at bedtime  . OneTouch Delica Lancets 84Y MISC Use as instructed to check blood sugars 2 times per day dx: e11.65  . OZEMPIC, 1 MG/DOSE, 2 MG/1.5ML SOPN INJECT INTO SKIN ONCE A WEEK (Patient taking differently: 1mg )  . simvastatin (ZOCOR) 10 MG tablet Take 1 tablet (10 mg total) by mouth daily.  [name] SYNTHROID 25 MCG tablet Take  0.5 tablet (12.5 mcg) by mouth on Mondays through Fridays, then take 1 tablet (25 mcg) by mouth on Saturdays and Sundays.   No facility-administered encounter medications on file as of 04/04/2020.    Patient Active Problem List   Diagnosis Date Noted  . Type 2 diabetes mellitus with stage 3 chronic kidney disease, with long-term current use of insulin ([place]) 11/29/2019  . Panniculitis 09/30/2019  . Back pain 09/30/2019  . Hepatoma ([place]) 01/21/2019  . Other cirrhosis of liver ([place]) 07/05/2018  . Hypertensive nephropathy  07/05/2018  . Chronic renal disease, stage III ([place]) 07/05/2018  . Chronic left shoulder pain 07/05/2018  . Hypothyroidism 02/06/2018  . Hepatitis C virus infection cured after antiviral drug therapy 02/04/2018  . Osteoarthritis of right knee 07/30/2017  . S/P laparoscopic sleeve gastrectomy July 2018 08/26/2016  . Preop cardiovascular exam 07/30/2016  . Dyspnea on exertion 07/30/2016  . Class 2 severe obesity due to excess calories with serious comorbidity and body mass index (BMI) of 39.0 to 39.9 in adult ([place]) 07/30/2016  . Bilateral lower extremity edema 07/30/2016    Conditions to be addressed/monitored: DMII and CKD Stage III; Transportation  Care Plan : Social Work Care Plan  Updates made by Teresa Golden since 04/04/2020 12:00 AM    Problem: Care Coordination     Long-Range Goal: Collaborate with RN Care Manager to perform appropriate assessments to assist with care coordination needs   Start Date: 03/02/2020  Expected End Date: 06/30/2020  Recent Progress: On track  Priority: Low  Note:   Current Barriers:  . Chronic conditions including DM II, CKD III, and HTN which put patient at increased risk of hospitalization  Social Work Clinical Goal(s):  [name] Over the next 120 days, patient will work with SW to address concerns related to care coordination  Interventions: . 1:1 collaboration with Teresa Chard, MD regarding development and update of comprehensive plan of care as evidenced by provider attestation and co-signature . Inter-disciplinary care team collaboration (see longitudinal plan of care) . Collaboration with [place] who indicates patient must receive a negative COVID test in order to access SC[facility] services. Patient having difficulty securing transportation to be tested on 2/15 at 10:00 at her primary providers office . Collaboration with [employer] services to inquire if they may assist with transportation for COVID testing - confirmed this  is a covered transportation service . Successful outbound call placed to the patient to inform of plans for SW to assist with transportation to 2/15 appointment via [employer] . Advised patient to expect a call from a transportation representative to inform of pick up time . Encouraged the patient to contact SW if she does not receive a call within 24 hours to confirm transportation arrangements and patient pick up time . Submitted transportation request form to [employer] department via email  Patient Goals/Self-Care Activities Over the next 60 days, patient will:   - Patient will self administer medications as prescribed Patient will attend all scheduled provider appointments Patient will call provider office for new concerns or questions Contact SW as needed prior to next scheduled call  Follow up Plan: SW will follow up with patient by phone over the next month       Follow Up Plan: SW will follow up with patient by phone over the next month      Teresa Golden, BSW, CDP Social Worker, Certified Dementia Practitioner [place] [facility] [phone]  Total time spent performing  care coordination and/or care management activities with the patient by phone or face to face = 20 minutes.

## 2020-04-04 NOTE — Patient Instructions (Signed)
  Goals we discussed today:  Goals Addressed            This Visit's Progress   . Work with SW to manage care coordination needs       Timeframe:  Long-Range Goal Priority:  [employer] Start Date:   12.16.21                          Expected End Date:   3.16.22                    Next date of contact: 3.7.22  Patient Goals/Self-Care Activities Over the next 45 days patient will:   - Patient will self administer medications as prescribed -Patient will attend all scheduled provider appointments -Patient will call provider office for new concerns or questions -Contact SW as needed prior to next scheduled call

## 2020-04-04 NOTE — Telephone Encounter (Signed)
The pt was asked how she is doing and that she had her antibody infusion on Monday.

## 2020-04-04 NOTE — Chronic Care Management (AMB) (Signed)
Chronic Care Management   CCM RN Visit Note  04/03/2020 Name: Teresa Golden MRN: [id number] DOB: 19-Oct-1945  Subjective: Teresa Golden is a 75 y.o. year old female who is a primary care patient of Glendale Chard, MD. The care management team was consulted for assistance with disease management and care coordination needs.    Engaged with patient by telephone for follow up visit in response to provider referral for case management and/or care coordination services.   Consent to Services:  The patient was given information about Chronic Care Management services, agreed to services, and gave verbal consent prior to initiation of services.  Please see initial visit note for detailed documentation.   Patient agreed to services and verbal consent obtained.   Assessment: Review of patient past medical history, allergies, medications, health status, including review of consultants reports, laboratory and other test data, was performed as part of comprehensive evaluation and provision of chronic care management services.   SDOH (Social Determinants of Health) assessments and interventions performed:  Yes - transportation   CCM Care Plan  Allergies  Allergen Reactions  . Meloxicam Other (See Comments)    Fever; muscle aches; "flu-like" symptoms  . Other     Rose fever and hay fever     Outpatient Encounter Medications as of 04/03/2020  Medication Sig  . amLODipine (NORVASC) 5 MG tablet Take 1 tablet (5 mg total) by mouth daily.  . B Complex-C (B-COMPLEX WITH VITAMIN C) tablet Take 1 tablet by mouth daily.  . Biotin 10000 MCG TABS Take 1 tablet by mouth daily.  . carvedilol (COREG CR) 20 MG 24 hr capsule TAKE 1 CAPSULE DAILY  . Cholecalciferol 5000 units TABS Take 5,000 Units by mouth daily.  . Colchicine 0.6 MG CAPS TAKE 1 CAPSULE DAILY AS    NEEDED  . Cyanocobalamin (VITAMIN B-12) 2500 MCG SUBL Take 2,500 mcg by mouth daily.  . diclofenac Sodium (VOLTAREN) 1 % GEL APPLY 2 GRAMS TO AFFECTED  AREA(S) 4 TIMES DAILY AS NEEDED  . docusate sodium (COLACE) 100 MG capsule Take 1 capsule (100 mg total) by mouth 2 (two) times daily. (Patient not taking: Reported on 02/09/2020)  . famotidine (PEPCID) 20 MG tablet Take 20 mg by mouth daily. PRN  . gabapentin (NEURONTIN) 300 MG capsule Take 600 mg by mouth at bedtime. Pt taking 600 MG at bedtime  . glucose blood (ONETOUCH VERIO) test strip Use as instructed to check blood sugars 2 times per day dx: e11.65  . insulin degludec (TRESIBA FLEXTOUCH) 100 UNIT/ML SOPN FlexTouch Pen Inject 0.09 mLs (9 Units total) into the skin daily at 10 pm. (Patient taking differently: Inject 8 Units into the skin daily at 10 pm.)  . losartan (COZAAR) 100 MG tablet Take 1 tablet (100 mg total) by mouth daily.  . Multiple Vitamins-Minerals (MULTIVITAMIN WITH MINERALS) tablet Take 1 tablet by mouth daily. Women's 50+  . naproxen sodium (ANAPROX) 220 MG tablet Take 440 mg by mouth 2 (two) times daily as needed (pain). 2 tablets every night at bedtime  . OneTouch Delica Lancets 47M MISC Use as instructed to check blood sugars 2 times per day dx: e11.65  . OZEMPIC, 1 MG/DOSE, 2 MG/1.5ML SOPN INJECT INTO SKIN ONCE A WEEK (Patient taking differently: 1mg )  . simvastatin (ZOCOR) 10 MG tablet Take 1 tablet (10 mg total) by mouth daily.  [name] SYNTHROID 25 MCG tablet Take 0.5 tablet (12.5 mcg) by mouth on Mondays through Fridays, then take 1 tablet (25 mcg) by  mouth on Saturdays and Sundays.   No facility-administered encounter medications on file as of 04/03/2020.    Patient Active Problem List   Diagnosis Date Noted  . Type 2 diabetes mellitus with stage 3 chronic kidney disease, with long-term current use of insulin ([place]) 11/29/2019  . Panniculitis 09/30/2019  . Back pain 09/30/2019  . Hepatoma ([place]) 01/21/2019  . Other cirrhosis of liver ([place]) 07/05/2018  . Hypertensive nephropathy 07/05/2018  . Chronic renal disease, stage III ([place]) 07/05/2018  . Chronic left shoulder  pain 07/05/2018  . Hypothyroidism 02/06/2018  . Hepatitis C virus infection cured after antiviral drug therapy 02/04/2018  . Osteoarthritis of right knee 07/30/2017  . S/P laparoscopic sleeve gastrectomy July 2018 08/26/2016  . Preop cardiovascular exam 07/30/2016  . Dyspnea on exertion 07/30/2016  . Class 2 severe obesity due to excess calories with serious comorbidity and body mass index (BMI) of 39.0 to 39.9 in adult ([place]) 07/30/2016  . Bilateral lower extremity edema 07/30/2016    Conditions to be addressed/monitored:DM, CKD III, Hypertensive Nephropathy, Class 2 severe obesity, Anxiety   Care Plan : General Plan of Care (Adult)  Updates made by Lynne Logan, RN since 04/04/2020 12:00 AM    Problem: COVID 19 Infection   Priority: High    Goal: COVID 19 Infection - Complications minimized or prevented   Start Date: 04/03/2020  Expected End Date: 05/01/2020  This Visit's Progress: On track  Priority: High  Note:   Current Barriers:   Ineffective Self Health Maintenance  Difficulty obtaining transportation while COVID positive  Currently UNABLE TO independently self manage needs related to chronic health conditions.   Knowledge Deficits related to short term plan for care coordination needs and long term plans for chronic disease management needs Clinical Goal(s):  [name] Collaboration with Glendale Chard, MD regarding development and update of comprehensive plan of care as evidenced by provider attestation and co-signature . Inter-disciplinary care team collaboration (see longitudinal plan of care)  Over the next 90 days, patient will work with care management team to address care coordination and chronic disease management needs related to Other COVID 19 Infection     Interventions:  04/03/20 Successful call completed with patient   Evaluation of current treatment plan related to COVID-19 infection self-management and patient's adherence to plan as established by  provider.  Collaboration with Glendale Chard, MD regarding development and update of comprehensive plan of care as evidenced by provider attestation       and co-signature  Inter-disciplinary care team collaboration (see longitudinal plan of care)  Determined patient received a monoclonial antibody infusion through [facility] on 04/02/20 w/o noted adverse reaction   Determined patient is experiencing mild fatigue and nasal congestion but otherwise feels better since receiving the infusion   Determined patient is self monitoring her BP and CBG at home with readings within target range  Educated on importance to stay well hydrated and get plenty of rest  Instructed patient to notify her PCP provider for new or worsening symptoms related to COVID 19  Instructed patient to seek medical attention promptly for unresolved chest pain or shortness of breath  Determined patient would like to be retested for COVID 19 following her 10 day quarantine due to needing proof for SCAT that she is no longer positive  Collaborated with embedded BSW Daneen Schick regarding patient's need for assistance with transportation to the PCP office next Tuesday at 10 am for drive-up retesting   Discussed plans with patient for ongoing  care management follow up and provided patient with direct contact information for care management team 04/04/20 Successful call to patient  . Confirmed with patient she is scheduled for COVID 19 retesting at the [facility] office on Tuesday, February 15th at 10 am . Advised patient our embedded BSW Daneen Schick will assist with arranging transportation and will notify her of the details, patient verbalizes understanding . Discussed plans with patient for ongoing care management follow up and provided patient with direct contact information for care management team Patient Goals/Self Care Activities:  Over the next 30 days, patient will:  Keep all scheduled follow up appointments for COVID  recheck as directed Stay well hydrated and take all prescribed medications as directed Notify PCP provider for new or worsening symptoms related to COVID 19 Seek medication attention right away for unresolved chest pain or shortness of breath   Follow Up Plan: Telephone follow up appointment with care management team member scheduled for: 04/19/20     Plan:Telephone follow up appointment with care management team member scheduled for:  04/19/20  Barb Merino, RN, BSN, CCM Care Management Coordinator [facility]  Direct Phone: [phone]

## 2020-04-05 ENCOUNTER — Ambulatory Visit: Payer: Self-pay

## 2020-04-05 ENCOUNTER — Ambulatory Visit (INDEPENDENT_AMBULATORY_CARE_PROVIDER_SITE_OTHER): Payer: Medicare HMO | Admitting: Addiction (Substance Use Disorder)

## 2020-04-05 ENCOUNTER — Encounter: Payer: Self-pay | Admitting: Addiction (Substance Use Disorder)

## 2020-04-05 DIAGNOSIS — F4323 Adjustment disorder with mixed anxiety and depressed mood: Secondary | ICD-10-CM

## 2020-04-05 DIAGNOSIS — E1122 Type 2 diabetes mellitus with diabetic chronic kidney disease: Secondary | ICD-10-CM

## 2020-04-05 DIAGNOSIS — N1831 Chronic kidney disease, stage 3a: Secondary | ICD-10-CM

## 2020-04-05 NOTE — Progress Notes (Signed)
[facility] Counselor/Therapist Progress Note  Patient ID: ELIAS BORDNER, MRN: [id number],    Date: 04/05/2020   Time Spent: 72mins  Treatment Type: Individual Therapy  Reported Symptoms: lonely but proud of herself.   Mental Status Exam:  Appearance:   NA     Behavior:  Appropriate and Sharing  Motor:  Normal  Speech/Language:   Clear and Coherent  Affect:  Appropriate  Mood:  anxious and sad  Thought process:  normal  Thought content:    Rumination  Sensory/Perceptual disturbances:    WNL  Orientation:  x4  Attention:  Good  Concentration:  Good  Memory:  WNL  Fund of knowledge:   Good  Insight:    Fair  Judgment:   Good  Impulse Control:  Good   Risk Assessment: Danger to Self:  No Self-injurious Behavior: No Danger to Others: No Duty to Warn:no Physical Aggression / Violence:No  Access to Firearms a concern: No  Gang Involvement:No   Virtual Visit via TELEPHONE : I connected with client by telephone (bec client had trouble with MyChart) with their informed consent, and verified client privacy and that I am speaking with the correct person using two identifiers. I discussed the limitations, risks, security and privacy concerns of performing psychotherapy and management service virtually and confirmed their location. I also discussed with the patient that there may be a patient responsible charge related to this service and to confirm with the front desk if their insurance covers teletherapy. I also discussed with the patient the availability of in person appointments. The patient expressed understanding and agreed to proceed. I discussed the treatment planning with the client. The client was provided an opportunity to ask questions and all were answered. The client agreed with the plan and demonstrated an understanding of the instructions. The client was advised to call our office if symptoms worsen or feel they are in a crisis state and need immediate contact.  Client also reminded of a crisis line number and to use 9-1-1 if there's an emergency.  Therapist Location: home; Client Location: home.  Subjective: Client reported struggling with more loneliness lately after having to delete her ex's number, who took her for granted and made her his side-chick. Client became tearful and processed the pain of the relationship and all past relationships where she wasn't valued and she dropped her standards for. Client processed how this altered her relationships with God and hurt her faith and her desires now to only seek the [place]. Therapist used MI & CBT & grief therapy with client to support client in processing her emotions, behaviors, and helping her to challenge negative self-talk while also using RPT with client to work on considering boundaries for future relationships due to how they affect her [place]. Therapist assessed for stability and client denied SI/HI/AVH.   Interventions: Cognitive Behavioral Therapy, Motivational Interviewing and Grief Therapy &CBT  Diagnosis:   ICD-10-CM   1. Adjustment disorder with mixed anxiety and depressed mood  F43.23      Plan of Care:  Client is to return to therapy with therapist every 1-2 weeks as needed to process traumas/ frustrations in a safe space, to be re-evaluated in 3 months.  Client is to practice mindfulness AEB daily meditation and body scans or as needed when flooded by emotion/pain.  Client is to learn/practice DBT wise mind & radical acceptance. Client is to practice self-compassion AEB being gentle with themselves, utilizing self-care techniques daily or as needed when  grieving something in the moment.  Client is to process grief/pain of their loss in a somatic body-felt sense way: ie using mindfulness, brainspotting, or trauma release as a method for releasing body pain/tension caused by grief.  Barnie Del, LCSW, LCAS, CCTP, CCS-I, BSP

## 2020-04-05 NOTE — Chronic Care Management (AMB) (Addendum)
Chronic Care Management    Social Work Note  04/05/2020 Name: Teresa Golden MRN: [id number] DOB: 14-Jun-1945  Teresa Golden is a 75 y.o. year old female who is a primary care patient of Glendale Chard, MD. The CCM team was consulted to assist the patient with chronic disease management and/or care coordination needs related to: Transportation Needs .   Engaged with patient by telephone for follow up visit in response to provider referral for social work chronic care management and care coordination services.   Consent to Services:  The patient was given information about Chronic Care Management services, agreed to services, and gave verbal consent prior to initiation of services.  Please see initial visit note for detailed documentation.   Patient agreed to services and consent obtained.   Assessment: Review of patient past medical history, allergies, medications, and health status, including review of relevant consultants reports was performed today as part of a comprehensive evaluation and provision of chronic care management and care coordination services.     SDOH (Social Determinants of Health) assessments and interventions performed:    Advanced Directives Status: Not addressed in this encounter.  CCM Care Plan  Allergies  Allergen Reactions  . Meloxicam Other (See Comments)    Fever; muscle aches; "flu-like" symptoms  . Other     Rose fever and hay fever     Outpatient Encounter Medications as of 04/05/2020  Medication Sig  . amLODipine (NORVASC) 5 MG tablet Take 1 tablet (5 mg total) by mouth daily.  . B Complex-C (B-COMPLEX WITH VITAMIN C) tablet Take 1 tablet by mouth daily.  . Biotin 10000 MCG TABS Take 1 tablet by mouth daily.  . carvedilol (COREG CR) 20 MG 24 hr capsule TAKE 1 CAPSULE DAILY  . Cholecalciferol 5000 units TABS Take 5,000 Units by mouth daily.  . Colchicine 0.6 MG CAPS TAKE 1 CAPSULE DAILY AS    NEEDED  . Cyanocobalamin (VITAMIN B-12) 2500 MCG SUBL Take  2,500 mcg by mouth daily.  . diclofenac Sodium (VOLTAREN) 1 % GEL APPLY 2 GRAMS TO AFFECTED AREA(S) 4 TIMES DAILY AS NEEDED  . docusate sodium (COLACE) 100 MG capsule Take 1 capsule (100 mg total) by mouth 2 (two) times daily. (Patient not taking: Reported on 02/09/2020)  . famotidine (PEPCID) 20 MG tablet Take 20 mg by mouth daily. PRN  . gabapentin (NEURONTIN) 300 MG capsule Take 600 mg by mouth at bedtime. Pt taking 600 MG at bedtime  . glucose blood (ONETOUCH VERIO) test strip Use as instructed to check blood sugars 2 times per day dx: e11.65  . insulin degludec (TRESIBA FLEXTOUCH) 100 UNIT/ML SOPN FlexTouch Pen Inject 0.09 mLs (9 Units total) into the skin daily at 10 pm. (Patient taking differently: Inject 8 Units into the skin daily at 10 pm.)  . losartan (COZAAR) 100 MG tablet Take 1 tablet (100 mg total) by mouth daily.  . Multiple Vitamins-Minerals (MULTIVITAMIN WITH MINERALS) tablet Take 1 tablet by mouth daily. Women's 50+  . naproxen sodium (ANAPROX) 220 MG tablet Take 440 mg by mouth 2 (two) times daily as needed (pain). 2 tablets every night at bedtime  . OneTouch Delica Lancets 42A MISC Use as instructed to check blood sugars 2 times per day dx: e11.65  . OZEMPIC, 1 MG/DOSE, 2 MG/1.5ML SOPN INJECT INTO SKIN ONCE A WEEK (Patient taking differently: 1mg )  . simvastatin (ZOCOR) 10 MG tablet Take 1 tablet (10 mg total) by mouth daily.  [name] SYNTHROID 25 MCG tablet Take  0.5 tablet (12.5 mcg) by mouth on Mondays through Fridays, then take 1 tablet (25 mcg) by mouth on Saturdays and Sundays.   No facility-administered encounter medications on file as of 04/05/2020.    Patient Active Problem List   Diagnosis Date Noted  . Type 2 diabetes mellitus with stage 3 chronic kidney disease, with long-term current use of insulin ([place]) 11/29/2019  . Panniculitis 09/30/2019  . Back pain 09/30/2019  . Hepatoma ([place]) 01/21/2019  . Other cirrhosis of liver ([place]) 07/05/2018  . Hypertensive nephropathy  07/05/2018  . Chronic renal disease, stage III ([place]) 07/05/2018  . Chronic left shoulder pain 07/05/2018  . Hypothyroidism 02/06/2018  . Hepatitis C virus infection cured after antiviral drug therapy 02/04/2018  . Osteoarthritis of right knee 07/30/2017  . S/P laparoscopic sleeve gastrectomy July 2018 08/26/2016  . Preop cardiovascular exam 07/30/2016  . Dyspnea on exertion 07/30/2016  . Class 2 severe obesity due to excess calories with serious comorbidity and body mass index (BMI) of 39.0 to 39.9 in adult ([place]) 07/30/2016  . Bilateral lower extremity edema 07/30/2016    Conditions to be addressed/monitored: HTN and DMII; Transportation  Care Plan : Social Work Care Plan  Updates made by Daneen Schick since 04/05/2020 12:00 AM    Problem: Care Coordination     Long-Range Goal: Collaborate with RN Care Manager to perform appropriate assessments to assist with care coordination needs   Start Date: 03/02/2020  Expected End Date: 06/30/2020  Recent Progress: On track  Priority: Low  Note:   Current Barriers:  . Chronic conditions including DM II, CKD III, and HTN which put patient at increased risk of hospitalization  Social Work Clinical Goal(s):  [name] Over the next 120 days, patient will work with SW to address concerns related to care coordination  Interventions: Completed 2.10.22 . Inbound call received from the patient indicating she has yet to hear from [employer] . SW sent a follow up e-mail to [facility] requesting confirmation of ability to assist with patient appointment on 2.15.22 . Received in bound call from the patient who indicates she was contacted by [facility] transportation and ride is secured for 2/15 appointment Completed 2.9.22 . 1:1 collaboration with Glendale Chard, MD regarding development and update of comprehensive plan of care as evidenced by provider attestation and co-signature . Inter-disciplinary care team collaboration (see longitudinal plan  of care) . Collaboration with [facility] who indicates patient must receive a negative COVID test in order to access SCAT services. Patient having difficulty securing transportation to be tested on 2/15 at 10:00 at her primary providers office . Collaboration with [employer] services to inquire if they may assist with transportation for COVID testing - confirmed this is a covered transportation service . Successful outbound call placed to the patient to inform of plans for SW to assist with transportation to 2/15 appointment via [employer] . Advised patient to expect a call from a transportation representative to inform of pick up time . Encouraged the patient to contact SW if she does not receive a call within 24 hours to confirm transportation arrangements and patient pick up time . Submitted transportation request form to [employer] department via email  Patient Goals/Self-Care Activities Over the next 60 days, patient will:   - Patient will self administer medications as prescribed Patient will attend all scheduled provider appointments Patient will call provider office for new concerns or questions Contact SW as needed prior to next scheduled call  Follow up Plan: SW will follow up with patient by phone over the next month       Follow Up Plan: SW will follow up with patient by phone over the next month.      Daneen Schick, BSW, CDP Social Worker, Certified Dementia Practitioner [facility] [phone]  Total time spent performing care coordination and/or care management activities with the patient by phone or face to face = 10 minutes.

## 2020-04-06 ENCOUNTER — Telehealth: Payer: Medicare HMO

## 2020-04-10 ENCOUNTER — Other Ambulatory Visit: Payer: Medicare HMO

## 2020-04-10 ENCOUNTER — Other Ambulatory Visit: Payer: Self-pay

## 2020-04-10 DIAGNOSIS — Z1152 Encounter for screening for COVID-19: Secondary | ICD-10-CM

## 2020-04-11 ENCOUNTER — Ambulatory Visit: Payer: Medicare HMO | Admitting: Internal Medicine

## 2020-04-11 ENCOUNTER — Ambulatory Visit: Payer: Medicare HMO

## 2020-04-11 DIAGNOSIS — E1122 Type 2 diabetes mellitus with diabetic chronic kidney disease: Secondary | ICD-10-CM

## 2020-04-11 DIAGNOSIS — E039 Hypothyroidism, unspecified: Secondary | ICD-10-CM

## 2020-04-11 DIAGNOSIS — I129 Hypertensive chronic kidney disease with stage 1 through stage 4 chronic kidney disease, or unspecified chronic kidney disease: Secondary | ICD-10-CM

## 2020-04-11 DIAGNOSIS — N183 Chronic kidney disease, stage 3 unspecified: Secondary | ICD-10-CM

## 2020-04-11 LAB — SARS-COV-2, NAA 2 DAY TAT

## 2020-04-11 LAB — NOVEL CORONAVIRUS, NAA: SARS-CoV-2, NAA: NOT DETECTED

## 2020-04-11 NOTE — Progress Notes (Signed)
Chronic Care Management Pharmacy Note  04/22/2020 Name:  Teresa Golden MRN:  [id number] DOB:  [birth year]  Subjective: Teresa Golden is an 75 y.o. year old female who is a primary patient of Glendale Chard, MD.  The CCM team was consulted for assistance with disease management and care coordination needs.    Engaged with patient by telephone for follow up visit in response to provider referral for pharmacy case management and/or care coordination services.   Consent to Services:  The patient was given the following information about Chronic Care Management services today, agreed to services, and gave verbal consent: 1. CCM service includes personalized support from designated clinical staff supervised by the primary care provider, including individualized plan of care and coordination with other care providers 2. 24/7 contact phone numbers for assistance for urgent and routine care needs. 3. Service will only be billed when office clinical staff spend 20 minutes or more in a month to coordinate care. 4. Only one practitioner may furnish and bill the service in a calendar month. 5.The patient may stop CCM services at any time (effective at the end of the month) by phone call to the office staff. 6. The patient will be responsible for cost sharing (co-pay) of up to 20% of the service fee (after annual deductible is met). Patient agreed to services and consent obtained.  Patient Care Team: Glendale Chard, MD as PCP - General (Internal Medicine) Daneen Schick as Social Worker [place], Claudette Stapler, RN as Case Manager Mayford Knife, [facility] (Pharmacist)  Recent office visits:  02/09/2020 OV   Recent consult visits: 01/06/2020 OV : Panniculitis  Hospital visits: None in previous 6 months  Objective:  Lab Results  Component Value Date   CREATININE 0.96 11/29/2019   BUN 18 11/29/2019   GFRNONAA 58 (L) 11/29/2019   GFRAA 67 11/29/2019   NA 145 (H) 11/29/2019   K 4.2 11/29/2019   CALCIUM  9.6 11/29/2019   CO2 23 11/29/2019    Lab Results  Component Value Date/Time   HGBA1C 6.7 (H) 11/29/2019 02:11 PM   HGBA1C 6.5 (H) 08/18/2019 02:10 PM   MICROALBUR 10 02/09/2020 10:59 AM   MICROALBUR 10 01/17/2019 06:09 PM    Last diabetic Eye exam:  Lab Results  Component Value Date/Time   HMDIABEYEEXA No Retinopathy 01/11/2020 12:00 AM    Last diabetic Foot exam: No results found for: HMDIABFOOTEX   Lab Results  Component Value Date   CHOL 150 02/09/2020   HDL 61 02/09/2020   LDLCALC 77 02/09/2020   TRIG 55 02/09/2020   CHOLHDL 2.5 02/09/2020    Hepatic Function Latest Ref Rng & Units 11/29/2019 08/18/2019 01/17/2019  Total Protein 6.0 - 8.5 g/dL 7.0 7.1 6.7  Albumin 3.7 - 4.7 g/dL 4.0 3.9 4.1  AST 0 - 40 IU/L '28 28 26  ' ALT 0 - 32 IU/L '30 18 18  ' Alk Phosphatase 44 - 121 IU/L 84 94 77  Total Bilirubin 0.0 - 1.2 mg/dL 0.4 0.5 0.5    Lab Results  Component Value Date/Time   TSH 1.230 07/05/2018 11:17 AM   FREET4 1.54 07/05/2018 11:17 AM    CBC Latest Ref Rng & Units 02/09/2020 01/17/2019 04/26/2018  WBC 3.4 - 10.8 x10E3/uL 10.8 7.1 8.3  Hemoglobin 11.1 - 15.9 g/dL 11.3 11.8 11.9  Hematocrit 34.0 - 46.6 % 34.0 34.5 35.6  Platelets 150 - 450 x10E3/uL 223 198 206    Lab Results  Component Value Date/Time   VD25OH 81.3  02/09/2020 02:43 PM    Clinical ASCVD: No  The 10-year ASCVD risk score Mikey Bussing DC Jr., et al., 2013) is: 41.6%   Values used to calculate the score:     Age: 18 years     Sex: Female     Is Non-Hispanic African American: Yes     Diabetic: Yes     Tobacco smoker: No     Systolic Blood Pressure: 680 mmHg     Is BP treated: Yes     HDL Cholesterol: 61 mg/dL     Total Cholesterol: 150 mg/dL    Depression screen [facility] 2/9 03/15/2020 08/18/2019 02/08/2019  Decreased Interest 0 0 0  Down, Depressed, Hopeless 2 0 0  PHQ - 2 Score 2 0 0  Altered sleeping 1 0 0  Tired, decreased energy 2 3 0  Change in appetite 0 0 0  Feeling bad or failure about  yourself  2 0 0  Trouble concentrating 0 0 0  Moving slowly or fidgety/restless 0 0 0  Suicidal thoughts 0 0 0  PHQ-9 Score 7 3 0  Difficult doing work/chores - Not difficult at all Not difficult at all  Some recent data might be hidden     Social History   Tobacco Use  Smoking Status Former Smoker  . Packs/day: 0.25  . Years: 10.00  . Pack years: 2.50  . Types: Cigarettes  Smokeless Tobacco Never Used  Tobacco Comment   quit 20 years ago   BP Readings from Last 3 Encounters:  04/02/20 (!) 178/73  03/30/20 130/70  02/14/20 110/75   Pulse Readings from Last 3 Encounters:  04/02/20 (!) 57  03/30/20 69  02/14/20 98   Wt Readings from Last 3 Encounters:  03/30/20 212 lb 9.6 oz (96.4 kg)  02/14/20 212 lb 9.6 oz (96.4 kg)  02/09/20 212 lb (96.2 kg)    Assessment/Interventions: Review of patient past medical history, allergies, medications, health status, including review of consultants reports, laboratory and other test data, was performed as part of comprehensive evaluation and provision of chronic care management services.   SDOH:  (Social Determinants of Health) assessments and interventions performed: Yes   CCM Care Plan  Allergies  Allergen Reactions  . Meloxicam Other (See Comments)    Fever; muscle aches; "flu-like" symptoms  . Other     Rose fever and hay fever     Medications Reviewed Today    Reviewed by Barnie Del, LCSW (Social Worker) on 04/19/20 at 1418  Med List Status: <None>  Medication Order Taking? Sig Documenting Provider Last Dose Status Informant  amLODipine (NORVASC) 5 MG tablet [id number] No Take 1 tablet (5 mg total) by mouth daily. Glendale Chard, MD Taking Active   B Complex-C (B-COMPLEX WITH VITAMIN C) tablet [id number] No Take 1 tablet by mouth daily. [provider] Taking Active Self  Biotin 10000 MCG TABS [id number] No Take 1 tablet by mouth daily. [provider] Taking Active Self  carvedilol (COREG CR) 20 MG 24 hr  capsule [id number] No TAKE 1 CAPSULE DAILY Glendale Chard, MD Taking Active   Cholecalciferol 5000 units TABS [id number] No Take 5,000 Units by mouth daily. [provider] Taking Active Self           Med Note Jimmey Ralph, [facility] I   Fri Aug 07, 2017  6:51 PM)    Colchicine 0.6 MG CAPS [id number] No TAKE 1 CAPSULE DAILY AS    NEEDED Glendale Chard, MD Taking Active   Cyanocobalamin (  VITAMIN B-12) 2500 MCG SUBL [id number] No Take 2,500 mcg by mouth daily. [provider] Taking Active Self  diclofenac Sodium (VOLTAREN) 1 % GEL [id number] No APPLY 2 GRAMS TO AFFECTED AREA(S) 4 TIMES DAILY AS NEEDED Glendale Chard, MD Taking Active   docusate sodium (COLACE) 100 MG capsule [id number] No Take 1 capsule (100 mg total) by mouth 2 (two) times daily. [place], Aaron Edelman, MD Taking Active   famotidine (PEPCID) 20 MG tablet [id number] No Take 20 mg by mouth daily. PRN [provider] Taking Active Self  gabapentin (NEURONTIN) 300 MG capsule [id number] No Take 600 mg by mouth at bedtime. Pt taking 600 MG at bedtime [provider] Taking Active Self  glucose blood (ONETOUCH VERIO) test strip [id number] No Use as instructed to check blood sugars 2 times per day dx: e11.65 Glendale Chard, MD Taking Active   insulin degludec (TRESIBA FLEXTOUCH) 100 UNIT/ML SOPN FlexTouch Pen [id number] No Inject 0.09 mLs (9 Units total) into the skin daily at 10 pm.  Patient taking differently: Inject 8 Units into the skin daily at 10 pm.   Glendale Chard, MD Taking Active   losartan (COZAAR) 100 MG tablet [id number] No Take 1 tablet (100 mg total) by mouth daily. Glendale Chard, MD Taking Active   Multiple Vitamins-Minerals (MULTIVITAMIN WITH MINERALS) tablet [id number] No Take 1 tablet by mouth daily. Women's 50+ [provider] Taking Active Self           Med Note Jimmey Ralph, [facility] I   Fri Aug 07, 2017  6:53 PM)    naproxen sodium (ANAPROX) 220 MG tablet [id number] No Take 440 mg by mouth 2  (two) times daily as needed (pain). 2 tablets every night at bedtime [provider] Taking Active Self           Med Note Jimmey Ralph, Gerhard Perches   Fri Aug 07, 2017  6:53 PM)    Jonetta Speak Lancets 24M MISC [id number] No Use as instructed to check blood sugars 2 times per day dx: e11.65 Glendale Chard, MD Taking Active   [facility], 1 MG/DOSE, 2 MG/1.5ML Bonney Aid [id number] No INJECT INTO SKIN ONCE A WEEK  Patient taking differently: 74m   SGlendale Chard MD Taking Active   simvastatin (ZOCOR) 10 MG tablet [id number] Take 1 tablet (10 mg total) by mouth daily. SGlendale Chard MD Taking Active   SYNTHROID 25 MCG tablet [id number] Take 0.5 tablet (12.5 mcg) by mouth on Mondays through Fridays, then take 1 tablet (25 mcg) by mouth on Saturdays and Sundays. SGlendale Chard MD Taking Active           Patient Active Problem List   Diagnosis Date Noted  . Type 2 diabetes mellitus with stage 3 chronic kidney disease, with long-term current use of insulin ([place] 11/29/2019  . Panniculitis 09/30/2019  . Back pain 09/30/2019  . Hepatoma ([facility] 01/21/2019  . Other cirrhosis of liver ([facility] 07/05/2018  . Hypertensive nephropathy 07/05/2018  . Chronic renal disease, stage III ([place] 07/05/2018  . Chronic left shoulder pain 07/05/2018  . Hypothyroidism 02/06/2018  . Hepatitis C virus infection cured after antiviral drug therapy 02/04/2018  . Osteoarthritis of right knee 07/30/2017  . S/P laparoscopic sleeve gastrectomy July 2018 08/26/2016  . Preop cardiovascular exam 07/30/2016  . Dyspnea on exertion 07/30/2016  . Class 2 severe obesity due to excess calories with serious comorbidity and body mass index (BMI) of 39.0 to 39.9 in adult ([facility] 07/30/2016  . Bilateral lower extremity edema 07/30/2016  Immunization History  Administered Date(s) Administered  . Fluad Quad(high Dose 65+) 11/10/2018, 11/29/2019  . Influenza, High Dose Seasonal PF 12/21/2017, 11/10/2018  . Influenza,inj,quad,  With Preservative 02/25/2016  . Influenza-Unspecified 10/25/2016  . Moderna Sars-Covid-2 Vaccination 03/28/2019, 04/25/2019, 01/02/2020  . Pneumococcal Polysaccharide-23 11/02/2013  . Pneumococcal-Unspecified 11/24/2016  . Zoster Recombinat (Shingrix) 11/03/2018, 12/31/2018    Conditions to be addressed/monitored:  Hypertension, Diabetes, Hypothyroidism, Osteoarthritis and Renal Disease  Care Plan : [place]  Updates made by Mayford Knife, RPH since 04/22/2020 12:00 AM    Problem: HTN, HLD, DM II   Priority: High    Long-Range Goal: Disease Management   Start Date: 04/11/2020  This Visit's Progress: On track  Priority: High  Note:     Current Barriers:  . Does not adhere to prescribed medication regimen   Pharmacist Clinical Goal(s):  [name] Over the next 90 days, patient will achieve adherence to monitoring guidelines and medication adherence to achieve therapeutic efficacy through collaboration with PharmD and provider.    Interventions: . 1:1 collaboration with Glendale Chard, MD regarding development and update of comprehensive plan of care as evidenced by provider attestation and co-signature . Inter-disciplinary care team collaboration (see longitudinal plan of care) . Comprehensive medication review performed; medication list updated in electronic medical record  Hypertension (BP goal <130/80) -uncontrolled - patient reports her home BP readings are doing well -Current treatment: . Amlodipine 5 mg tablet take daily  . Carvedilol 20 mg take 1 capsule daily  . Losartan 100 mg take daily  -Current home readings: <130/80 -patient will bring in BP reading log for review  -Current dietary habits: patient has gastric sleeve  -Current exercise habits:  -Denies hypotensive/hypertensive symptoms -Educated on BP goals and benefits of medications for prevention of heart attack, stroke and kidney damage; Daily salt intake goal < 2300 mg; Exercise goal of 150  minutes per week; Importance of home blood pressure monitoring; Proper BP monitoring technique; -Counseled to monitor BP at home 1-2 times per week and, document, and provide log at future appointments -Counseled on diet and exercise extensively Recommended to continue current medication  Hyperlipidemia: (LDL goal < 70) -controlled -Current treatment: . Simvastatin 20 mg taking 1 tablet daily  -Educated on Benefits of statin for ASCVD risk reduction; Importance of limiting foods high in cholesterol; Exercise goal of 150 minutes per week; -Counseled on diet and exercise extensively Recommended to continue current medication  Diabetes (A1c goal <7%) -controlled -Current medications: . Ozempic 1 mg - once a week on Saturday  -Current home glucose readings . fasting glucose: 135, 145  . post prandial glucose: 167, 144  -Denies hypoglycemic/hyperglycemic symptoms -Current meal patterns:  . Breakfast: toast with a tbsp of peanut butter, croissant  . lunch: salad, ravioli and soup - small portions  . dinner: salad, ravioli, and soup - small portions  . snacks: Pop-Secret 100 calories per serving . drinks: green [place] diet tea, ocean spray zero calories, propel - black cherry  -Current exercise: patient reports that she is going to resume exercising at least 2-3 times per week using a set of exercises given to her by a therapist.  She also does 10 minutes of jogging in the water and 10 minutes afterward. She is usually in the water for at least 40 minutes.  -Educated onA1c and blood sugar goals; Complications of diabetes including kidney damage, retinal damage, and cardiovascular disease; Benefits of weight loss; Carbohydrate counting and/or plate method -Counseled to check feet daily  and get yearly eye exams -Counseled on the importance of checking blood sugar twice per day.  Recommended patient closely monitor the amount of carbs that she has with each serving.  Educated on the  importance of drinking water and will be giving her a Nutrition in the fast lane hand out.    Patient Goals/Self-Care Activities . Over the next 90 days, patient will:  - take medications as prescribed target a minimum of 150 minutes of moderate intensity exercise weekly  Follow Up Plan: Telephone follow up appointment with care management team member scheduled for: The patient has been provided with contact information for the care management team and has been advised to call with any health related questions or concerns.       Medication Assistance: Application for Ozempic  medication assistance program. in process.  Anticipated assistance start date 04/2020.  See plan of care for additional detail.  Patient's preferred pharmacy is:  [facility], [address] [place] [address] Phone: [phone] Fax: [phone]  [facility] - [place], [place] - [address] [place] [address] Phone: [phone] Fax: [phone]  [facility], [address] [place] [address] Phone: [phone] Fax: [phone]  Uses pill box? Yes Pt endorses 90% compliance  We discussed: Benefits of medication synchronization, packaging and delivery as well as enhanced pharmacist oversight with [facility]. Patient decided to: Continue current medication management strategy  Care Plan and Follow Up Patient Decision:  Patient agrees to Care Plan and Follow-up.  Plan: Telephone follow up appointment with care management team member scheduled for:  07/05/2020 and The patient has been provided with contact information for the care management team and has been advised to call with any health related questions or concerns.   [name], PharmD Clinical Pharmacist [facility] [phone]

## 2020-04-12 ENCOUNTER — Ambulatory Visit: Payer: Self-pay

## 2020-04-12 ENCOUNTER — Encounter: Payer: Self-pay | Admitting: Nurse Practitioner

## 2020-04-12 ENCOUNTER — Ambulatory Visit: Payer: Medicare HMO | Admitting: Internal Medicine

## 2020-04-12 DIAGNOSIS — N1831 Chronic kidney disease, stage 3a: Secondary | ICD-10-CM

## 2020-04-12 DIAGNOSIS — E1122 Type 2 diabetes mellitus with diabetic chronic kidney disease: Secondary | ICD-10-CM

## 2020-04-12 DIAGNOSIS — E039 Hypothyroidism, unspecified: Secondary | ICD-10-CM

## 2020-04-12 DIAGNOSIS — I129 Hypertensive chronic kidney disease with stage 1 through stage 4 chronic kidney disease, or unspecified chronic kidney disease: Secondary | ICD-10-CM

## 2020-04-12 DIAGNOSIS — N183 Chronic kidney disease, stage 3 unspecified: Secondary | ICD-10-CM | POA: Diagnosis not present

## 2020-04-12 NOTE — Chronic Care Management (AMB) (Signed)
Chronic Care Management    Social Work Note  04/12/2020 Name: Teresa Golden MRN: [id number] DOB: 11-04-1945  Teresa Golden is a 75 y.o. year old female who is a primary care patient of Glendale Chard, MD. The CCM team was consulted to assist the patient with chronic disease management and/or care coordination needs related to: Transportation Needs .   Engaged with patient by telephone for follow up visit in response to provider referral for social work chronic care management and care coordination services.   Consent to Services:  The patient was given information about Chronic Care Management services, agreed to services, and gave verbal consent prior to initiation of services.  Please see initial visit note for detailed documentation.   Patient agreed to services and consent obtained.   Assessment: Review of patient past medical history, allergies, medications, and health status, including review of relevant consultants reports was performed today as part of a comprehensive evaluation and provision of chronic care management and care coordination services.     SDOH (Social Determinants of Health) assessments and interventions performed:    Advanced Directives Status: Not addressed in this encounter.  CCM Care Plan  Allergies  Allergen Reactions  . Meloxicam Other (See Comments)    Fever; muscle aches; "flu-like" symptoms  . Other     Rose fever and hay fever     Outpatient Encounter Medications as of 04/12/2020  Medication Sig  . amLODipine (NORVASC) 5 MG tablet Take 1 tablet (5 mg total) by mouth daily.  . B Complex-C (B-COMPLEX WITH VITAMIN C) tablet Take 1 tablet by mouth daily.  . Biotin 10000 MCG TABS Take 1 tablet by mouth daily.  . carvedilol (COREG CR) 20 MG 24 hr capsule TAKE 1 CAPSULE DAILY  . Cholecalciferol 5000 units TABS Take 5,000 Units by mouth daily.  . Colchicine 0.6 MG CAPS TAKE 1 CAPSULE DAILY AS    NEEDED  . Cyanocobalamin (VITAMIN B-12) 2500 MCG SUBL Take  2,500 mcg by mouth daily.  . diclofenac Sodium (VOLTAREN) 1 % GEL APPLY 2 GRAMS TO AFFECTED AREA(S) 4 TIMES DAILY AS NEEDED  . docusate sodium (COLACE) 100 MG capsule Take 1 capsule (100 mg total) by mouth 2 (two) times daily.  . famotidine (PEPCID) 20 MG tablet Take 20 mg by mouth daily. PRN  . gabapentin (NEURONTIN) 300 MG capsule Take 600 mg by mouth at bedtime. Pt taking 600 MG at bedtime  . glucose blood (ONETOUCH VERIO) test strip Use as instructed to check blood sugars 2 times per day dx: e11.65  . insulin degludec (TRESIBA FLEXTOUCH) 100 UNIT/ML SOPN FlexTouch Pen Inject 0.09 mLs (9 Units total) into the skin daily at 10 pm. (Patient taking differently: Inject 8 Units into the skin daily at 10 pm.)  . losartan (COZAAR) 100 MG tablet Take 1 tablet (100 mg total) by mouth daily.  . Multiple Vitamins-Minerals (MULTIVITAMIN WITH MINERALS) tablet Take 1 tablet by mouth daily. Women's 50+  . naproxen sodium (ANAPROX) 220 MG tablet Take 440 mg by mouth 2 (two) times daily as needed (pain). 2 tablets every night at bedtime  . OneTouch Delica Lancets 03E MISC Use as instructed to check blood sugars 2 times per day dx: e11.65  . OZEMPIC, 1 MG/DOSE, 2 MG/1.5ML SOPN INJECT INTO SKIN ONCE A WEEK (Patient taking differently: 1mg )  . simvastatin (ZOCOR) 10 MG tablet Take 1 tablet (10 mg total) by mouth daily.  [name] SYNTHROID 25 MCG tablet Take 0.5 tablet (12.5 mcg) by mouth  on Mondays through Fridays, then take 1 tablet (25 mcg) by mouth on Saturdays and Sundays.   No facility-administered encounter medications on file as of 04/12/2020.    Patient Active Problem List   Diagnosis Date Noted  . Type 2 diabetes mellitus with stage 3 chronic kidney disease, with long-term current use of insulin ([place]) 11/29/2019  . Panniculitis 09/30/2019  . Back pain 09/30/2019  . Hepatoma ([place]) 01/21/2019  . Other cirrhosis of liver ([place]) 07/05/2018  . Hypertensive nephropathy 07/05/2018  . Chronic renal disease, stage  III ([place]) 07/05/2018  . Chronic left shoulder pain 07/05/2018  . Hypothyroidism 02/06/2018  . Hepatitis C virus infection cured after antiviral drug therapy 02/04/2018  . Osteoarthritis of right knee 07/30/2017  . S/P laparoscopic sleeve gastrectomy July 2018 08/26/2016  . Preop cardiovascular exam 07/30/2016  . Dyspnea on exertion 07/30/2016  . Class 2 severe obesity due to excess calories with serious comorbidity and body mass index (BMI) of 39.0 to 39.9 in adult ([place]) 07/30/2016  . Bilateral lower extremity edema 07/30/2016    Conditions to be addressed/monitored: DMII and CKD Stage III; Transportation  Care Plan : Social Work Care Plan  Updates made by Daneen Schick since 04/12/2020 12:00 AM    Problem: Care Coordination     Long-Range Goal: Collaborate with RN Care Manager to perform appropriate assessments to assist with care coordination needs   Start Date: 03/02/2020  Expected End Date: 06/30/2020  Recent Progress: On track  Priority: Low  Note:   Current Barriers:  . Chronic conditions including DM II, CKD III, and HTN which put patient at increased risk of hospitalization  Social Work Clinical Goal(s):  [name] Over the next 120 days, patient will work with SW to address concerns related to care coordination  Interventions: Completed 2.17.22 . Inbound call received from the patient who reports concern that she has not received e-mailed letter to prove negativity  . Discussed the patient is eager to get back into her normal daily routine which includes church and needs a letter to prove she is COVID negative to access [facility] . Collaboration with Almedia Balls with [facility] to inform of negative test results . Sent physician letter via fax to [place] office . Received communication from [place] patient may begin using [facility] services at this time . Successful outbound call placed to the patient to advise she may access transportation Completed 2.9.22 . 1:1 collaboration  with Glendale Chard, MD regarding development and update of comprehensive plan of care as evidenced by provider attestation and co-signature . Inter-disciplinary care team collaboration (see longitudinal plan of care) . Collaboration with [place] who indicates patient must receive a negative COVID test in order to access [facility] services. Patient having difficulty securing transportation to be tested on 2/15 at 10:00 at her primary providers office . Collaboration with [employer] services to inquire if they may assist with transportation for COVID testing - confirmed this is a covered transportation service . Successful outbound call placed to the patient to inform of plans for SW to assist with transportation to 2/15 appointment via [employer] . Advised patient to expect a call from a transportation representative to inform of pick up time . Encouraged the patient to contact SW if she does not receive a call within 24 hours to confirm transportation arrangements and patient pick up time . Submitted transportation request form to [employer] via email  Patient Goals/Self-Care Activities Over the next 60  days, patient will:   - Patient will self administer medications as prescribed Patient will attend all scheduled provider appointments Patient will call provider office for new concerns or questions Contact SW as needed prior to next scheduled call  Follow up Plan: SW will follow up with patient by phone over the next month       Follow Up Plan: SW will follow up with patient by phone over the next month      Daneen Schick, BSW, CDP Social Worker, Certified Dementia Practitioner [place] [facility] [place] [facility] [phone]  Total time spent performing care coordination and/or care management activities with the patient by phone or face to face = 35 minutes.

## 2020-04-13 ENCOUNTER — Telehealth: Payer: Medicare HMO

## 2020-04-19 ENCOUNTER — Telehealth: Payer: Medicare HMO

## 2020-04-19 ENCOUNTER — Ambulatory Visit (INDEPENDENT_AMBULATORY_CARE_PROVIDER_SITE_OTHER): Payer: Medicare HMO | Admitting: Addiction (Substance Use Disorder)

## 2020-04-19 ENCOUNTER — Other Ambulatory Visit: Payer: Self-pay

## 2020-04-19 DIAGNOSIS — F4323 Adjustment disorder with mixed anxiety and depressed mood: Secondary | ICD-10-CM | POA: Diagnosis not present

## 2020-04-19 NOTE — Progress Notes (Signed)
      [facility] Counselor/Therapist Progress Note  [name], MRN: [id number],    Date: 04/19/2020   Time Spent: 32mins  Treatment Type: Individual Therapy  Reported Symptoms: guilt  Mental Status Exam:  Appearance:   Casual     Behavior:  Appropriate and Sharing  Motor:  Normal  Speech/Language:   Clear and Coherent  Affect:  Appropriate  Mood:  anxious  Thought process:  normal  Thought content:    Rumination  Sensory/Perceptual disturbances:    WNL  Orientation:  x4  Attention:  Good  Concentration:  Good  Memory:  WNL  Fund of knowledge:   Good  Insight:    Fair  Judgment:   Good  Impulse Control:  Good   Risk Assessment: Danger to Self:  No Self-injurious Behavior: No Danger to Others: No Duty to Warn:no Physical Aggression / Violence:No  Access to Firearms a concern: No  Gang Involvement:No    Subjective: Client reported struggling with guilt about past mistakes. Client processed pain surrounding her childhood that led to some poor parental decisions she said. Therapist used MI & CBT with client to support client, affirm her strengths, and help her forgive herself using CBT to challenge delusions about what she thinks were poor decisions affecting her daughter's childhood. Client further processed plans for her to use the newfound intuition and forgiveness shes given herself in her adult years in therapy. Client reported she didn't have a sense of self due to what she internalized about her mother's MH illness being her fault, but discussed how she wants to share that new intuition with other clients & become a therapist. Therapist assessed for stability and client denied SI/HI/AVH.   Interventions: Cognitive Behavioral Therapy, Motivational Interviewing and Grief Therapy   Diagnosis:   ICD-10-CM   1. Adjustment disorder with mixed anxiety and depressed mood  F43.23      Plan of Care:  Client is to return to therapy with therapist every 1-2  weeks as needed to process traumas/ frustrations in a safe space, to be re-evaluated in 3 months.  Client is to practice mindfulness AEB daily meditation and body scans or as needed when flooded by emotion/pain.  Client is to learn/practice DBT wise mind & radical acceptance. Client is to practice self-compassion AEB being gentle with themselves, utilizing self-care techniques daily or as needed when grieving something in the moment.  Client is to process grief/pain of their loss in a somatic body-felt sense way: ie using mindfulness, brainspotting, or trauma release as a method for releasing body pain/tension caused by grief.  Barnie Del, LCSW, LCAS, CCTP, CCS-I, BSP

## 2020-04-22 NOTE — Patient Instructions (Addendum)
Care Management  Note   Name: Teresa Golden MRN: [id number] DOB: 27-May-1945  Teresa Golden is a 75 y.o. year old female who is a primary care patient of Glendale Chard, MD. The care management team was consulted for assistance with chronic disease management and care coordination needs.   Ms. Murley was given information about Care Management services today including:  1. CCM service includes personalized support from designated clinical staff supervised by the physician, including individualized plan of care and coordination with other care providers 2. 24/7 contact phone numbers for assistance for urgent and routine care needs. 3. Service will only be billed when office clinical staff spend 20 minutes or more in a month to coordinate care. 4. Only one practitioner may furnish and bill the service in a calendar month. 5. The patient may stop CCM services at amy time (effective at the end of the month) by phone call to the office staff. 6. The patient will be responsible for cost sharing (co-pay) or up to 20% of the service fee (after annual deductible is met) Current Barriers:  . Does not adhere to prescribed medication regimen   Pharmacist Clinical Goal(s):  [name] Over the next 90 days, patient will achieve adherence to monitoring guidelines and medication adherence to achieve therapeutic efficacy through collaboration with PharmD and provider.    Interventions: . 1:1 collaboration with Glendale Chard, MD regarding development and update of comprehensive plan of care as evidenced by provider attestation and co-signature . Inter-disciplinary care team collaboration (see longitudinal plan of care) . Comprehensive medication review performed; medication list updated in electronic medical record  Hypertension (BP goal <130/80) -uncontrolled - patient reports her home BP readings are doing well -Current treatment: . Amlodipine 5 mg tablet take daily  . Carvedilol 20 mg take 1 capsule daily   . Losartan 100 mg take daily  -Current home readings: <130/80 -patient will bring in BP reading log for review  -Current dietary habits: patient has gastric sleeve  -Denies hypotensive/hypertensive symptoms -Educated on BP goals and benefits of medications for prevention of heart attack, stroke and kidney damage; Daily salt intake goal < 2300 mg; Exercise goal of 150 minutes per week; Importance of home blood pressure monitoring; Proper BP monitoring technique; -Counseled to monitor BP at home 1-2 times per week and, document, and provide log at future appointments -Counseled on diet and exercise extensively Recommended to continue current medication  Hyperlipidemia: (LDL goal < 70) -controlled -Current treatment: . Simvastatin 20 mg taking 1 tablet daily  -Educated on Benefits of statin for ASCVD risk reduction; Importance of limiting foods high in cholesterol; Exercise goal of 150 minutes per week; -Counseled on diet and exercise extensively Recommended to continue current medication  Diabetes (A1c goal <7%) -controlled -Current medications: . Ozempic 1 mg - once a week on Saturday  -Current home glucose readings . fasting glucose: 135, 145  . post prandial glucose: 167, 144  -Denies hypoglycemic/hyperglycemic symptoms -Current meal patterns:  . Breakfast: toast with a tbsp of peanut butter, croissant  . lunch: salad, ravioli and soup - small portions  . dinner: salad, ravioli, and soup - small portions  . snacks: Pop-Secret 100 calories per serving . drinks: green [place] diet tea, ocean spray zero calories, propel - black cherry  -Current exercise: patient reports that she is going to resume exercising at least 2-3 times per week using a set of exercises given to her by a therapist.  She also does 10 minutes of jogging  in the water and 10 minutes afterward. She is usually in the water for at least 40 minutes.  -Educated onA1c and blood sugar goals; Complications of  diabetes including kidney damage, retinal damage, and cardiovascular disease; Benefits of weight loss; Carbohydrate counting and/or plate method -Counseled to check feet daily and get yearly eye exams -Counseled on the importance of checking blood sugar twice per day.  Recommended patient closely monitor the amount of carbs that she has with each serving.  Educated on the importance of drinking water and will be giving her a Nutrition in the fast lane hand out.    Patient Goals/Self-Care Activities . Over the next 90 days, patient will:  - take medications as prescribed target a minimum of 150 minutes of moderate intensity exercise weekly  Follow Up Plan: Telephone follow up appointment with care management team member scheduled for: The patient has been provided with contact information for the care management team and has been advised to call with any health related questions or concerns.   Patient agreed to services and verbal consent obtained.  Follow up plan:   Telephone follow up appointment with care management team member scheduled for:  07/10/2020 and The patient has been provided with contact information for the care management team and has been advised to call with any health related questions or concerns.   [name], PharmD Clinical Pharmacist [facility] [phone]

## 2020-04-24 ENCOUNTER — Ambulatory Visit: Payer: Medicare HMO | Admitting: Internal Medicine

## 2020-04-26 ENCOUNTER — Ambulatory Visit (INDEPENDENT_AMBULATORY_CARE_PROVIDER_SITE_OTHER): Payer: Medicare HMO | Admitting: Addiction (Substance Use Disorder)

## 2020-04-26 DIAGNOSIS — F4323 Adjustment disorder with mixed anxiety and depressed mood: Secondary | ICD-10-CM

## 2020-04-26 NOTE — Progress Notes (Signed)
[facility] Counselor/Therapist Progress Note  [name], MRN: [id number],    Date: 04/26/2020   Time Spent: 35mins  Treatment Type: Individual Therapy  Reported Symptoms: shame, frustration  Mental Status Exam:  Appearance:   Casual     Behavior:  Appropriate and Sharing  Motor:  Normal  Speech/Language:   Clear and Coherent  Affect:  Appropriate  Mood:  angry, anxious and sad  Thought process:  normal  Thought content:    Rumination  Sensory/Perceptual disturbances:    WNL  Orientation:  x4  Attention:  Good  Concentration:  Good  Memory:  WNL  Fund of knowledge:   Good  Insight:    Good  Judgment:   Good  Impulse Control:  Good   Risk Assessment: Danger to Self:  No Self-injurious Behavior: No Danger to Others: No Duty to Warn:no Physical Aggression / Violence:No  Access to Firearms a concern: No  Gang Involvement:No   .Virtual Visit via TELEPHONE :  I connected with client by telephone, with their informed consent, and verified client privacy and that I am speaking with the correct person using two identifiers. I discussed the limitations, risks, security and privacy concerns of performing psychotherapy and management service virtually and confirmed their location. I also discussed with the patient that there may be a patient responsible charge related to this service and to confirm with the front desk if their insurance covers teletherapy. I also discussed with the patient the availability of in person appointments. The patient expressed understanding and agreed to proceed. I discussed the treatment planning with the client. The client was provided an opportunity to ask questions and all were answered. The client agreed with the plan and demonstrated an understanding of the instructions. The client was advised to call our office if symptoms worsen or feel they are in a crisis state and need immediate contact. Client also reminded of a crisis line  number and to use 9-1-1 if there's an emergency.  Therapist Location: office; Client Location: home.  Subjective: Client reported a conflict with her values and her actions, leading her to feel ashamed of her past decisions related to a man who was in her life. Client processed the struggle with feeling like shes unfaithful to what God wants, like her mom was, and processed all the trauma that caused in her life as a child. Therapist used MI & CBT with client to validate, the childhood pain, support her in processing ways to honor her values and challenging negative self-talk that full of shame filled statements towards herself that arent true. Client processed some other stressors right now in her life and her concerns that she wont be ready for the surgery she needs, questioning if she made the right decision for herself. Therapist assessed for stability and client denied SI/HI/AVH.   Interventions: Cognitive Behavioral Therapy and Motivational Interviewing   Diagnosis:   ICD-10-CM   1. Adjustment disorder with mixed anxiety and depressed mood  F43.23    Plan of Care:  Client is to return to therapy with therapist every 1-2 weeks as needed to process traumas/ frustrations in a safe space, to be re-evaluated in 3 months.  Client is to practice mindfulness AEB daily meditation and body scans or as needed when flooded by emotion/pain.  Client is to learn/practice DBT wise mind & radical acceptance. Client is to practice self-compassion AEB being gentle with themselves, utilizing self-care techniques daily or as needed when grieving something  in the moment.  Client is to process grief/pain of their loss in a somatic body-felt sense way: ie using mindfulness, brainspotting, or trauma release as a method for releasing body pain/tension caused by grief.  Barnie Del, LCSW, LCAS, CCTP, CCS-I, BSP

## 2020-04-30 ENCOUNTER — Telehealth: Payer: Self-pay

## 2020-04-30 ENCOUNTER — Ambulatory Visit (INDEPENDENT_AMBULATORY_CARE_PROVIDER_SITE_OTHER): Payer: Medicare HMO

## 2020-04-30 DIAGNOSIS — N1831 Chronic kidney disease, stage 3a: Secondary | ICD-10-CM | POA: Diagnosis not present

## 2020-04-30 DIAGNOSIS — E1122 Type 2 diabetes mellitus with diabetic chronic kidney disease: Secondary | ICD-10-CM | POA: Diagnosis not present

## 2020-04-30 DIAGNOSIS — I129 Hypertensive chronic kidney disease with stage 1 through stage 4 chronic kidney disease, or unspecified chronic kidney disease: Secondary | ICD-10-CM | POA: Diagnosis not present

## 2020-04-30 DIAGNOSIS — N183 Chronic kidney disease, stage 3 unspecified: Secondary | ICD-10-CM | POA: Diagnosis not present

## 2020-04-30 NOTE — Patient Instructions (Signed)
   Goals we discussed today:  Goals Addressed            This Visit's Progress   . Work with SW to manage care coordination needs       Timeframe:  Long-Range Goal Priority:  [employer] Start Date:   12.16.21                          Expected End Date:   3.16.22                    Next date of contact: 3.28.22  Patient Goals/Self-Care Activities Over the next 45 days patient will:   - Patient will self administer medications as prescribed -Patient will attend all scheduled provider appointments -Patient will call provider office for new concerns or questions -Contact SW as needed prior to next scheduled call

## 2020-04-30 NOTE — Telephone Encounter (Signed)
  Chronic Care Management   Outreach Note  04/30/2020 Name: Teresa Golden MRN: [id number] DOB: 1946-02-07  Referred by: Glendale Chard, MD Reason for referral : Chronic Care Management   An unsuccessful telephone outreach was attempted today. The patient was referred to the case management team for assistance with care management and care coordination.   Follow Up Plan: A HIPAA compliant phone message was left for the patient providing contact information and requesting a return call.  The care management team will reach out to the patient again over the next 10 days.   Daneen Schick, BSW, CDP Social Worker, Certified Dementia Practitioner [place] [facility] [place] [facility] [phone]

## 2020-04-30 NOTE — Chronic Care Management (AMB) (Signed)
Chronic Care Management    Social Work Note  04/30/2020 Name: Teresa Golden MRN: [id number] DOB: 11-05-1945  Teresa Golden is a 75 y.o. year old female who is a primary care patient of Glendale Chard, MD. The CCM team was consulted to assist the patient with chronic disease management and/or care coordination needs related to: [employer] .   Engaged with patient by telephone for follow up visit in response to provider referral for social work chronic care management and care coordination services.   Consent to Services:  The patient was given information about Chronic Care Management services, agreed to services, and gave verbal consent prior to initiation of services.  Please see initial visit note for detailed documentation.   Patient agreed to services and consent obtained.   Assessment: Review of patient past medical history, allergies, medications, and health status, including review of relevant consultants reports was performed today as part of a comprehensive evaluation and provision of chronic care management and care coordination services.     SDOH (Social Determinants of Health) assessments and interventions performed:    Advanced Directives Status: Not addressed in this encounter.  CCM Care Plan  Allergies  Allergen Reactions  . Meloxicam Other (See Comments)    Fever; muscle aches; "flu-like" symptoms  . Other     Rose fever and hay fever     Outpatient Encounter Medications as of 04/30/2020  Medication Sig  . amLODipine (NORVASC) 5 MG tablet Take 1 tablet (5 mg total) by mouth daily.  . B Complex-C (B-COMPLEX WITH VITAMIN C) tablet Take 1 tablet by mouth daily.  . Biotin 10000 MCG TABS Take 1 tablet by mouth daily.  . carvedilol (COREG CR) 20 MG 24 hr capsule TAKE 1 CAPSULE DAILY  . Cholecalciferol 5000 units TABS Take 5,000 Units by mouth daily.  . Colchicine 0.6 MG CAPS TAKE 1 CAPSULE DAILY AS    NEEDED  . Cyanocobalamin (VITAMIN B-12) 2500 MCG SUBL Take  2,500 mcg by mouth daily.  . diclofenac Sodium (VOLTAREN) 1 % GEL APPLY 2 GRAMS TO AFFECTED AREA(S) 4 TIMES DAILY AS NEEDED  . docusate sodium (COLACE) 100 MG capsule Take 1 capsule (100 mg total) by mouth 2 (two) times daily.  . famotidine (PEPCID) 20 MG tablet Take 20 mg by mouth daily. PRN  . gabapentin (NEURONTIN) 300 MG capsule Take 600 mg by mouth at bedtime. Pt taking 600 MG at bedtime  . glucose blood (ONETOUCH VERIO) test strip Use as instructed to check blood sugars 2 times per day dx: e11.65  . insulin degludec (TRESIBA FLEXTOUCH) 100 UNIT/ML SOPN FlexTouch Pen Inject 0.09 mLs (9 Units total) into the skin daily at 10 pm. (Patient taking differently: Inject 8 Units into the skin daily at 10 pm.)  . losartan (COZAAR) 100 MG tablet Take 1 tablet (100 mg total) by mouth daily.  . Multiple Vitamins-Minerals (MULTIVITAMIN WITH MINERALS) tablet Take 1 tablet by mouth daily. Women's 50+  . naproxen sodium (ANAPROX) 220 MG tablet Take 440 mg by mouth 2 (two) times daily as needed (pain). 2 tablets every night at bedtime  . OneTouch Delica Lancets 09N MISC Use as instructed to check blood sugars 2 times per day dx: e11.65  . OZEMPIC, 1 MG/DOSE, 2 MG/1.5ML SOPN INJECT INTO SKIN ONCE A WEEK (Patient taking differently: 1mg )  . simvastatin (ZOCOR) 10 MG tablet Take 1 tablet (10 mg total) by mouth daily.  [name] SYNTHROID 25 MCG tablet Take 0.5 tablet (12.5 mcg) by mouth  on Mondays through Fridays, then take 1 tablet (25 mcg) by mouth on Saturdays and Sundays.   No facility-administered encounter medications on file as of 04/30/2020.    Patient Active Problem List   Diagnosis Date Noted  . Type 2 diabetes mellitus with stage 3 chronic kidney disease, with long-term current use of insulin ([place]) 11/29/2019  . Panniculitis 09/30/2019  . Back pain 09/30/2019  . Hepatoma ([place]) 01/21/2019  . Other cirrhosis of liver ([place]) 07/05/2018  . Hypertensive nephropathy 07/05/2018  . Chronic renal disease, stage  III ([place]) 07/05/2018  . Chronic left shoulder pain 07/05/2018  . Hypothyroidism 02/06/2018  . Hepatitis C virus infection cured after antiviral drug therapy 02/04/2018  . Osteoarthritis of right knee 07/30/2017  . S/P laparoscopic sleeve gastrectomy July 2018 08/26/2016  . Preop cardiovascular exam 07/30/2016  . Dyspnea on exertion 07/30/2016  . Class 2 severe obesity due to excess calories with serious comorbidity and body mass index (BMI) of 39.0 to 39.9 in adult ([place]) 07/30/2016  . Bilateral lower extremity edema 07/30/2016    Conditions to be addressed/monitored: HTN, DMII and CKD Stage III; Transportation and Care Coordination  Care Plan : Social Work Care Plan  Updates made by Daneen Schick since 04/30/2020 12:00 AM    Problem: Care Coordination     Long-Range Goal: Collaborate with RN Care Manager to perform appropriate assessments to assist with care coordination needs   Start Date: 03/02/2020  Expected End Date: 06/30/2020  Recent Progress: On track  Priority: Low  Note:   Current Barriers:  . Chronic conditions including DM II, CKD III, and HTN which put patient at increased risk of hospitalization  Social Work Clinical Goal(s):  [name] Over the next 120 days, patient will work with SW to address concerns related to care coordination  Interventions: . 1:1 collaboration with Glendale Chard, MD regarding development and update of comprehensive plan of care as evidenced by provider attestation and co-signature . Inter-disciplinary care team collaboration (see longitudinal plan of care) . Inbound call received from the patient in response to voice message left by SW . Discussed the patient arranged transportation to an appointment last week via her health plan benefit but was not picked up which caused patient to cancel her appointment last minute - patient is concerned she will be charged a no show fee . Advised the patient SW was unable to determine if she would be charged a no show  fee but could outreach Set designer  . Determined the patient did contact her health plan to complain and was informed they would contact the practice to explain why patient missed her appointment . SW collaboration with Environmental education officer via e-mail to advise of reason for missed appointment and to request patient outreach regarding if a no show fee will be charged . Scheduled follow up call over the next 3 weeks  Patient Goals/Self-Care Activities Over the next 60 days, patient will:   - Patient will self administer medications as prescribed Patient will attend all scheduled provider appointments Patient will call provider office for new concerns or questions Contact SW as needed prior to next scheduled call  Follow up Plan: SW will follow up with patient by phone over the next month       Follow Up Plan: SW will follow up with patient by phone over the next 3 weeks.      Daneen Schick, BSW, CDP Social Worker, Certified Dementia Practitioner [place] [facility] [place] [facility] [phone]  Total time  spent performing care coordination and/or care management activities with the patient by phone or face to face = 20 minutes.

## 2020-05-02 ENCOUNTER — Telehealth: Payer: Self-pay

## 2020-05-02 ENCOUNTER — Encounter: Payer: Self-pay | Admitting: Internal Medicine

## 2020-05-02 NOTE — Chronic Care Management (AMB) (Signed)
Chronic Care Management Pharmacy Assistant   Name: Teresa Golden  MRN: [id number] DOB: 1946-02-01   Reason for Encounter: Hypertension Adherence Call-Patient Assistance Coordination.    Recent office visits:  04/30/20-Humble, Tillie Rung (CCM). 04/12/20-Humble, Kendra (CCM).    Recent consult visits:  None.  Hospital visits:  None in previous 6 months  Medications: Outpatient Encounter Medications as of 05/02/2020  Medication Sig   amLODipine (NORVASC) 5 MG tablet Take 1 tablet (5 mg total) by mouth daily.   B Complex-C (B-COMPLEX WITH VITAMIN C) tablet Take 1 tablet by mouth daily.   Biotin 10000 MCG TABS Take 1 tablet by mouth daily.   carvedilol (COREG CR) 20 MG 24 hr capsule TAKE 1 CAPSULE DAILY   Cholecalciferol 5000 units TABS Take 5,000 Units by mouth daily.   Colchicine 0.6 MG CAPS TAKE 1 CAPSULE DAILY AS    NEEDED   Cyanocobalamin (VITAMIN B-12) 2500 MCG SUBL Take 2,500 mcg by mouth daily.   diclofenac Sodium (VOLTAREN) 1 % GEL APPLY 2 GRAMS TO AFFECTED AREA(S) 4 TIMES DAILY AS NEEDED   docusate sodium (COLACE) 100 MG capsule Take 1 capsule (100 mg total) by mouth 2 (two) times daily.   famotidine (PEPCID) 20 MG tablet Take 20 mg by mouth daily. PRN   gabapentin (NEURONTIN) 300 MG capsule Take 600 mg by mouth at bedtime. Pt taking 600 MG at bedtime   glucose blood (ONETOUCH VERIO) test strip Use as instructed to check blood sugars 2 times per day dx: e11.65   insulin degludec (TRESIBA FLEXTOUCH) 100 UNIT/ML SOPN FlexTouch Pen Inject 0.09 mLs (9 Units total) into the skin daily at 10 pm. (Patient taking differently: Inject 8 Units into the skin daily at 10 pm.)   losartan (COZAAR) 100 MG tablet Take 1 tablet (100 mg total) by mouth daily.   Multiple Vitamins-Minerals (MULTIVITAMIN WITH MINERALS) tablet Take 1 tablet by mouth daily. Women's 50+   naproxen sodium (ANAPROX) 220 MG tablet Take 440 mg by mouth 2 (two) times daily as needed (pain). 2 tablets every  night at bedtime   OneTouch Delica Lancets 63O MISC Use as instructed to check blood sugars 2 times per day dx: e11.65   OZEMPIC, 1 MG/DOSE, 2 MG/1.5ML SOPN INJECT INTO SKIN ONCE A WEEK (Patient taking differently: 1mg )   simvastatin (ZOCOR) 10 MG tablet Take 1 tablet (10 mg total) by mouth daily.   SYNTHROID 25 MCG tablet Take 0.5 tablet (12.5 mcg) by mouth on Mondays through Fridays, then take 1 tablet (25 mcg) by mouth on Saturdays and Sundays.   No facility-administered encounter medications on file as of 05/02/2020.   Reviewed chart prior to disease state call. Spoke with patient regarding BP  Recent Office Vitals: BP Readings from Last 3 Encounters:  04/02/20 (!) 178/73  03/30/20 130/70  02/14/20 110/75   Pulse Readings from Last 3 Encounters:  04/02/20 (!) 57  03/30/20 69  02/14/20 98    Wt Readings from Last 3 Encounters:  03/30/20 212 lb 9.6 oz (96.4 kg)  02/14/20 212 lb 9.6 oz (96.4 kg)  02/09/20 212 lb (96.2 kg)     Kidney Function Lab Results  Component Value Date/Time   CREATININE 0.96 11/29/2019 02:11 PM   CREATININE 0.83 08/18/2019 02:10 PM   GFRNONAA 58 (L) 11/29/2019 02:11 PM   GFRAA 67 11/29/2019 02:11 PM    BMP Latest Ref Rng & Units 11/29/2019 08/18/2019 04/19/2019  Glucose 65 - 99 mg/dL 128(H) 119(H) 100(H)  BUN 8 -  27 mg/dL 18 12 15   Creatinine 0.57 - 1.00 mg/dL 0.96 0.83 1.09(H)  BUN/Creat Ratio 12 - 28 19 14 14   Sodium 134 - 144 mmol/L 145(H) 140 144  Potassium 3.5 - 5.2 mmol/L 4.2 4.0 4.1  Chloride 96 - 106 mmol/L 105 101 106  CO2 20 - 29 mmol/L 23 26 23   Calcium 8.7 - 10.3 mg/dL 9.6 9.6 9.4     Current antihypertensive regimen:   Amlodipine 5 mg tablet take daily   Carvedilol 20 mg take 1 capsule daily   Losartan 100 mg take daily    How often are you checking your Blood Pressure? twice daily. In the morning and at night.    Current home BP readings: None at this time due to power outage at home.   What recent interventions/DTPs  have been made by any provider to improve Blood Pressure control since last CPP Visit:  - Recent interventions was to monitor BP at home 1-2 times per week and, document, and provide log at future appointments. Counseled on diet and exercise extensively. - Patient reports she is taking her medications as directed.   Any recent hospitalizations or ED visits since last visit with CPP? No    What diet changes have been made to improve Blood Pressure Control?  o Patient reports she continues to eat right and stay away from salt.   What exercise is being done to improve your Blood Pressure Control?  o Patient reports she is active in the house with her daily routine chores.  Adherence Review: Is the patient currently on ACE/ARB medication? Yes Does the patient have >5 day gap between last estimated fill dates? No  05/02/20-Patient reports she discussed with Dr. Baird Cancer and [name], CPP of the carvedilol long-acting Tier 3 drug. Patient stated it is going up over $100 for a 90 DS. Per patient, Dr. Baird Cancer directed her she will need to stay on her current medication but should seek patient assistance through the clinical pharmacist. The patient voiced she has only about 45 days left of the carvedilol.   I informed the patient I am in the process of contacting several patient assistance resources to assist her with the cost barrier to receive her medication before running out. The patient voiced understanding.   Patient reported her blood sugars have been doing very good, her most recent FBG reading was 122.   Patient explained there is a power outage in her area and she is without power at the present time but was told it should be back up and running at or around 4:30 PM today. The patient couldn't speak very long and unable to provide me any blood pressure readings at this time. I informed the patient I will give her a call the next day when power is back on to schedule an a CCM Call visit  appointment with [name], CPP. The patient voiced understanding.   05/03/20-Called the patient's insurance company to confirm the tier of Carvdeilol 20 MG 24 hr capsules. Per representative, unable to see what tier does this specific medication fall under however the patient will still have to pay the patient responsibility amount which is 25% and insurance covers 75% of the allowance. Representative suggested using a drug card such as Good Rx or Single Care for lower price on the Carvedilol through a local pharmacy.  05/04/20-Called and spoke to patient assistance programs ([facility] [place], [facility]) who stated they no longer cover for the Carvedilol 20MG  24 hr  capsule. Patient Assistance program [facility] voiced the patient would have to be a CHF patient in order to be covered for the program.  05/08/20-Called and spoke to the patient explaining all of the different resources/options I have researched and the benefits in using a drug coupon card. The patient voiced her appreciation for the effort and stated she will contact her PCP to see if there is an alternative drug she can possibly switch under. During the call I was able to schedule a CCM Call visit follow-up with [name], CPP, the patient confirmed a CCM Call visit on 06/05/20 at 2:00 PM. Informed the patient I will be calling the day before to remind her of appointment. The patient voiced understanding.   Follow-Up: Pharmacist Review  Star Rating Drugs: Losartan 100 MG tablet: #90 DS, last filled on 02/08/20 at [facility]. Simvastatin 10 MG tablet: #90 DS, last filled on 03/08/20 at [facility]. Ozempic 1MG : #30 DS, last filled on 07/11/20 at [facility].  [name], CPP Notified.  Raynelle Highland, [place] Pharmacist Assistant [phone] CCM Total Time: 65 minutes

## 2020-05-07 ENCOUNTER — Other Ambulatory Visit: Payer: Self-pay

## 2020-05-07 ENCOUNTER — Ambulatory Visit (INDEPENDENT_AMBULATORY_CARE_PROVIDER_SITE_OTHER): Payer: Medicare HMO | Admitting: Internal Medicine

## 2020-05-07 VITALS — BP 148/72 | HR 59 | Ht 70.0 in | Wt 210.0 lb

## 2020-05-07 DIAGNOSIS — I10 Essential (primary) hypertension: Secondary | ICD-10-CM | POA: Diagnosis not present

## 2020-05-07 DIAGNOSIS — U071 COVID-19: Secondary | ICD-10-CM

## 2020-05-07 DIAGNOSIS — R9431 Abnormal electrocardiogram [ECG] [EKG]: Secondary | ICD-10-CM

## 2020-05-07 DIAGNOSIS — Z01810 Encounter for preprocedural cardiovascular examination: Secondary | ICD-10-CM

## 2020-05-07 DIAGNOSIS — E1122 Type 2 diabetes mellitus with diabetic chronic kidney disease: Secondary | ICD-10-CM

## 2020-05-07 DIAGNOSIS — N183 Chronic kidney disease, stage 3 unspecified: Secondary | ICD-10-CM

## 2020-05-07 DIAGNOSIS — Z794 Long term (current) use of insulin: Secondary | ICD-10-CM

## 2020-05-07 NOTE — Patient Instructions (Addendum)
Medication Instructions:  No Changes In Medications at this time.  *If you need a refill on your cardiac medications before your next appointment, please call your pharmacy*  Testing/Procedures: Your physician has requested that you have an echocardiogram. Echocardiography is a painless test that uses sound waves to create images of your heart. It provides your doctor with information about the size and shape of your heart and how well your heart's chambers and valves are working. You may receive an ultrasound enhancing agent through an IV if needed to better visualize your heart during the echo.This procedure takes approximately one hour. There are no restrictions for this procedure. This will take place at the [address].   Follow-Up: At [facility], you and your health needs are our priority.  As part of our continuing mission to provide you with exceptional heart care, we have created designated Provider Care Teams.  These Care Teams include your primary Cardiologist (physician) and Advanced Practice Providers (APPs -  Physician Assistants and Nurse Practitioners) who all work together to provide you with the care you need, when you need it.  Your next appointment:   Friday MAY 20th at 1:40 PM   The format for your next appointment:   In Person  Provider:   Cherlynn Kaiser, MD

## 2020-05-07 NOTE — Progress Notes (Signed)
Cardiology Office Note:    Date:  05/07/2020   ID:  Teresa Golden, DOB 1945/03/17, MRN [id number]  PCP:  Glendale Chard, MD  Cardiologist:  No primary care provider on file.  Electrophysiologist:  None   Referring MD: Glendale Chard, MD   Chief Complaint/Reason for Referral: Abnormal EKG - new RBBB  History of Present Illness:    Teresa Golden is a 75 y.o. female with a history of DM2, CKD, HTN, GERD, Hepatitis C treated with Harvoni, Hypothyroidism, who presents for evaluation of abnormal EKG prior to pannectomy 07/30/20.  Recent covid infection, congestion and cough. Terrible headache. Improving overall.  Anticipating pannectomy July 30, 2020. Lost 100lb after gastric sleeve.  Vascular studies performed recently - nonocclusive plaque in arterial studies.   A1C well controlled per her report, HbA1c 6.7% in December.   [facility] [employer] and [facility]. She denies chest pain, chest pressure, dyspnea at rest or with exertion, palpitations, PND, orthopnea, or leg swelling. Denies nausea, vomiting. Denies syncope or presyncope. Denies dizziness or lightheadedness.  BP at home 130s/60s.   ECG at PMD showed new onset RBBB with QRS duration 130 msec. No associated symptoms with ECG change from prior.   Past Medical History:  Diagnosis Date  . Arthritis   . Chronic kidney disease    self reports ckd stage 3   . Diabetes mellitus, type II, insulin dependent ([place])    With neurologic complications. Bilateral lower extremity peripheral neuropathy  . Dyspnea    with excertion; no issues now since weight loss surgery   . Endometriosis   . Essential hypertension   . GERD (gastroesophageal reflux disease)   . 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 Hypertensive nephropathy 07/05/2018  . Hypothyroidism   . Hypothyroidism 02/06/2018  . Morbid obesity with BMI of 50.0-59.9, adult ([place])   . Scoliosis     Past Surgical History:  Procedure  Laterality Date  . ABDOMINAL HYSTERECTOMY     uterus  . CHOLECYSTECTOMY    . EYE SURGERY     cataract extraction bilateral   . JOINT REPLACEMENT     Left knee  . KNEE ARTHROPLASTY Right 07/30/2017   Procedure: RIGHT TOTAL KNEE ARTHROPLASTY WITH COMPUTER NAVIGATION;  Surgeon: Rod Can, MD;  Location: [facility];  Service: Orthopedics;  Laterality: Right;  Needs RNFA  . LAPAROSCOPIC GASTRIC BANDING     and reversal  . LAPAROSCOPIC GASTRIC SLEEVE RESECTION N/A 08/26/2016   Procedure: LAPAROSCOPIC GASTRIC SLEEVE RESECTION WITH UPPER ENOD;  Surgeon: Johnathan Hausen, MD;  Location: [facility];  Service: General;  Laterality: N/A;  . TONSILLECTOMY    . TRANSTHORACIC ECHOCARDIOGRAM  11/2013   EF 65-70%. Normal diastolic Fxn.  Normal Valves.    Current Medications: Current Meds  Medication Sig  . amLODipine (NORVASC) 5 MG tablet Take 1 tablet (5 mg total) by mouth daily.  . B Complex-C (B-COMPLEX WITH VITAMIN C) tablet Take 1 tablet by mouth daily.  . Biotin 10000 MCG TABS Take 1 tablet by mouth daily.  . carvedilol (COREG CR) 20 MG 24 hr capsule TAKE 1 CAPSULE DAILY  . Cholecalciferol 5000 units TABS Take 5,000 Units by mouth daily.  . Colchicine 0.6 MG CAPS TAKE 1 CAPSULE DAILY AS    NEEDED  . Cyanocobalamin (VITAMIN B-12) 2500 MCG SUBL Take 2,500 mcg by mouth daily.  . diclofenac Sodium (VOLTAREN) 1 % GEL APPLY 2 GRAMS TO AFFECTED  AREA(S) 4 TIMES DAILY AS NEEDED  . docusate sodium (COLACE) 100 MG capsule Take 1 capsule (100 mg total) by mouth 2 (two) times daily.  . famotidine (PEPCID) 20 MG tablet Take 20 mg by mouth daily. PRN  . gabapentin (NEURONTIN) 300 MG capsule Take 600 mg by mouth at bedtime. Pt taking 600 MG at bedtime  . glucose blood (ONETOUCH VERIO) test strip Use as instructed to check blood sugars 2 times per day dx: e11.65  . insulin degludec (TRESIBA FLEXTOUCH) 100 UNIT/ML SOPN FlexTouch Pen Inject 0.09 mLs (9 Units total) into the skin daily at 10 pm. (Patient taking  differently: Inject 8 Units into the skin daily at 10 pm.)  . losartan (COZAAR) 100 MG tablet Take 1 tablet (100 mg total) by mouth daily.  . Multiple Vitamins-Minerals (MULTIVITAMIN WITH MINERALS) tablet Take 1 tablet by mouth daily. Women's 50+  . naproxen sodium (ANAPROX) 220 MG tablet Take 440 mg by mouth 2 (two) times daily as needed (pain). 2 tablets every night at bedtime  . OneTouch Delica Lancets 41Y MISC Use as instructed to check blood sugars 2 times per day dx: e11.65  . OZEMPIC, 1 MG/DOSE, 2 MG/1.5ML SOPN INJECT INTO SKIN ONCE A WEEK (Patient taking differently: 1mg )  . simvastatin (ZOCOR) 10 MG tablet Take 1 tablet (10 mg total) by mouth daily.  [name] SYNTHROID 25 MCG tablet Take 0.5 tablet (12.5 mcg) by mouth on Mondays through Fridays, then take 1 tablet (25 mcg) by mouth on Saturdays and Sundays.     Allergies:   Meloxicam and Other   Social History   Tobacco Use  . Smoking status: Former Smoker    Packs/day: 0.25    Years: 10.00    Pack years: 2.50    Types: Cigarettes  . Smokeless tobacco: Never Used  . Tobacco comment: quit 20 years ago  Vaping Use  . Vaping Use: Never used  Substance Use Topics  . Alcohol use: Not Currently    Alcohol/week: 1.0 standard drink    Types: 1 Glasses of wine per week    Comment: occasional  . Drug use: No     Family History: The patient's family history includes Heart attack in her mother; Heart failure in her mother; Stroke in her father.  ROS:   Please see the history of present illness.    All other systems reviewed and are negative.  EKGs/Labs/Other Studies Reviewed:    The following studies were reviewed today:  EKG:  Sinus bradycardia rate 59, nonspecific T wave abnl.  Recent Labs: 11/29/2019: ALT 30; BUN 18; Creatinine, Ser 0.96; Potassium 4.2; Sodium 145 02/09/2020: Hemoglobin 11.3; Platelets 223  Recent Lipid Panel    Component Value Date/Time   CHOL 150 02/09/2020 1443   TRIG 55 02/09/2020 1443   HDL 61  02/09/2020 1443   CHOLHDL 2.5 02/09/2020 1443   LDLCALC 77 02/09/2020 1443    Physical Exam:    VS:  BP (!) 148/72   Pulse (!) 59   Ht 5\' 10"  (1.778 m)   Wt 210 lb (95.3 kg)   SpO2 97%   BMI 30.13 kg/m     Wt Readings from Last 5 Encounters:  05/07/20 210 lb (95.3 kg)  03/30/20 212 lb 9.6 oz (96.4 kg)  02/14/20 212 lb 9.6 oz (96.4 kg)  02/09/20 212 lb (96.2 kg)  01/06/20 216 lb (98 kg)    Constitutional: No acute distress Eyes: sclera non-icteric, normal conjunctiva and lids ENMT: normal dentition, moist mucous  membranes Cardiovascular: regular rhythm, normal rate, no murmurs. S1 and S2 normal. Radial pulses normal bilaterally. No jugular venous distention.  Respiratory: clear to auscultation bilaterally GI : normal bowel sounds, soft and nontender. No distention.   MSK: extremities warm, well perfused. No edema.  NEURO: grossly nonfocal exam, moves all extremities. PSYCH: alert and oriented x 3, normal mood and affect.   ASSESSMENT:    1. Abnormal EKG   2. Preoperative cardiovascular examination   3. Essential hypertension   4. COVID-19 virus infection   5. Type 2 diabetes mellitus with stage 3 chronic kidney disease, with long-term current use of insulin, unspecified whether stage 3a or 3b CKD (HCC)   6. Stage 3 chronic kidney disease, unspecified whether stage 3a or 3b CKD ([place])    PLAN:    Abnormal EKG - Plan: ECHOCARDIOGRAM COMPLETE Preoperative cardiovascular examination -Her ECG at her primary care doctor's office showed a right bundle branch block pattern.  Her ECG in our office today shows nonspecific T wave abnormalities.  She is otherwise asymptomatic with no chest pain or shortness of breath.  I think the next best test would be an echocardiogram.  Final preoperative comments will be provided after echocardiogram is performed so she can be appropriately risk stratified.  Essential hypertension - Plan: EKG 12-Lead -Her blood pressure is elevated today but  has otherwise been well controlled at prior office visits.  We discussed rechecking this at follow-up and if still elevated will consider titration of therapy.  COVID-19 virus infection-symptoms are improving, patient has no significant concerns regarding her recovery.  Total time of encounter: 60 minutes total time of encounter, including 45 minutes spent in face-to-face patient care on the date of this encounter. This time includes coordination of care and counseling regarding above mentioned problem list. Remainder of non-face-to-face time involved reviewing chart documents/testing relevant to the patient encounter and documentation in the medical record. I have independently reviewed documentation from referring provider.   Cherlynn Kaiser, MD, [facility]    Medication Adjustments/Labs and Tests Ordered: Current medicines are reviewed at length with the patient today.  Concerns regarding medicines are outlined above.   Orders Placed This Encounter  Procedures  . EKG 12-Lead  . ECHOCARDIOGRAM COMPLETE    No orders of the defined types were placed in this encounter.   Patient Instructions  Medication Instructions:  No Changes In Medications at this time.  *If you need a refill on your cardiac medications before your next appointment, please call your pharmacy*  Testing/Procedures: Your physician has requested that you have an echocardiogram. Echocardiography is a painless test that uses sound waves to create images of your heart. It provides your doctor with information about the size and shape of your heart and how well your heart's chambers and valves are working. You may receive an ultrasound enhancing agent through an IV if needed to better visualize your heart during the echo.This procedure takes approximately one hour. There are no restrictions for this procedure. This will take place at the [address], [address].   Follow-Up: At [facility],  you and your health needs are our priority.  As part of our continuing mission to provide you with exceptional heart care, we have created designated Provider Care Teams.  These Care Teams include your primary Cardiologist (physician) and Advanced Practice Providers (APPs -  Physician Assistants and Nurse Practitioners) who all work together to provide you with the care you need, when you  need it.  Your next appointment:   Friday MAY 20th at 1:40 PM   The format for your next appointment:   In Person  Provider:   Cherlynn Kaiser, MD

## 2020-05-08 ENCOUNTER — Encounter: Payer: Self-pay | Admitting: Internal Medicine

## 2020-05-08 ENCOUNTER — Encounter: Payer: Medicare HMO | Admitting: Plastic Surgery

## 2020-05-10 ENCOUNTER — Ambulatory Visit (INDEPENDENT_AMBULATORY_CARE_PROVIDER_SITE_OTHER): Payer: Medicare HMO | Admitting: Addiction (Substance Use Disorder)

## 2020-05-10 ENCOUNTER — Other Ambulatory Visit: Payer: Self-pay

## 2020-05-10 DIAGNOSIS — F4323 Adjustment disorder with mixed anxiety and depressed mood: Secondary | ICD-10-CM | POA: Diagnosis not present

## 2020-05-10 NOTE — Progress Notes (Signed)
      [facility] Counselor/Therapist Progress Note  [name], MRN: [id number],    Date: 05/10/2020   Time Spent:  30mins  Treatment Type: Individual Therapy  Reported Symptoms: lots of change, trying to adjust  Mental Status Exam:  Appearance:   Casual     Behavior:  Appropriate and Sharing  Motor:  Normal  Speech/Language:   Clear and Coherent  Affect:  Appropriate  Mood:  normal  Thought process:  normal  Thought content:    Rumination  Sensory/Perceptual disturbances:    WNL  Orientation:  x4  Attention:  Good  Concentration:  Good  Memory:  WNL  Fund of knowledge:   Good  Insight:    Good  Judgment:   Good  Impulse Control:  Good   Risk Assessment: Danger to Self:  No Self-injurious Behavior: No Danger to Others: No Duty to Warn:no Physical Aggression / Violence:No  Access to Firearms a concern: No  Gang Involvement:No   Subjective: Client reported stress and anxiety caused by issues with waiting for her landlord's handy-man to finish laying down the floor for 5 months. Client expresed the stress of a messy kitchen due to her cabinets being unpacked so everything could be off the floor. Client setting boundaries with her landloard and therapist used MI with client to support her and affirm her strength to lay those down with her landlord to hep her keep her sanity. Client processed the guy she broke up with reaching out to her on FB and her considering it being the romanticizing the idea of going back to him, but processed the negatives of going back to that relationship. Therapist and client used RPT to discuss the process of relapse with negative old romantic relationships. Therapist assessed for stability and client denied SI/HI/AVH.   Interventions: Motivational Interviewing and RPT   Diagnosis:   ICD-10-CM   1. Adjustment disorder with mixed anxiety and depressed mood  F43.23     Plan of Care:  Client is to return to therapy with therapist  every 1-2 weeks as needed to process traumas/ frustrations in a safe space, to be re-evaluated in 3 months.  Client is to practice mindfulness AEB daily meditation and body scans or as needed when flooded by emotion/pain.  Client is to learn/practice DBT wise mind & radical acceptance. Client is to practice self-compassion AEB being gentle with themselves, utilizing self-care techniques daily or as needed when grieving something in the moment.  Client is to process grief/pain of their loss in a somatic body-felt sense way: ie using mindfulness, brainspotting, or trauma release as a method for releasing body pain/tension caused by grief.  Barnie Del, LCSW, LCAS, CCTP, CCS-I, BSP

## 2020-05-21 ENCOUNTER — Telehealth: Payer: Medicare HMO

## 2020-05-21 ENCOUNTER — Telehealth: Payer: Self-pay

## 2020-05-21 NOTE — Telephone Encounter (Signed)
  Chronic Care Management   Outreach Note  05/21/2020 Name: Teresa Golden MRN: [id number] DOB: 08-22-1945  Referred by: Glendale Chard, MD Reason for referral : Chronic Care Management   An unsuccessful telephone outreach was attempted today. The patient was referred to the case management team for assistance with care management and care coordination.   Follow Up Plan: A HIPAA compliant phone message was left for the patient providing contact information and requesting a return call.  The care management team will reach out to the patient again over the next 14 days.   Daneen Schick, BSW, CDP Social Worker, Certified Dementia Practitioner [place] [facility] [place] [facility] [phone]

## 2020-05-22 ENCOUNTER — Ambulatory Visit: Payer: Medicare HMO

## 2020-05-22 ENCOUNTER — Telehealth: Payer: Self-pay

## 2020-05-22 DIAGNOSIS — E1122 Type 2 diabetes mellitus with diabetic chronic kidney disease: Secondary | ICD-10-CM | POA: Diagnosis not present

## 2020-05-22 DIAGNOSIS — N183 Chronic kidney disease, stage 3 unspecified: Secondary | ICD-10-CM

## 2020-05-22 DIAGNOSIS — N1831 Chronic kidney disease, stage 3a: Secondary | ICD-10-CM

## 2020-05-22 DIAGNOSIS — I129 Hypertensive chronic kidney disease with stage 1 through stage 4 chronic kidney disease, or unspecified chronic kidney disease: Secondary | ICD-10-CM

## 2020-05-22 NOTE — Chronic Care Management (AMB) (Signed)
Chronic Care Management    Social Work Note  05/22/2020 Name: Teresa Golden MRN: [id number] DOB: 07-27-1945  Teresa Golden is a 75 y.o. year old female who is a primary care patient of Teresa Chard, MD. The CCM team was consulted to assist the patient with chronic disease management and/or care coordination needs related to: [employer] .   Engaged with patient by telephone for follow up visit in response to provider referral for social work chronic care management and care coordination services.   Consent to Services:  The patient was given information about Chronic Care Management services, agreed to services, and gave verbal consent prior to initiation of services.  Please see initial visit note for detailed documentation.   Patient agreed to services and consent obtained.   Assessment: Review of patient past medical history, allergies, medications, and health status, including review of relevant consultants reports was performed today as part of a comprehensive evaluation and provision of chronic care management and care coordination services.     SDOH (Social Determinants of Health) assessments and interventions performed:    Advanced Directives Status: Not addressed in this encounter.  CCM Care Plan  Allergies  Allergen Reactions  . Meloxicam Other (See Comments)    Fever; muscle aches; "flu-like" symptoms  . Other     Rose fever and hay fever     Outpatient Encounter Medications as of 05/22/2020  Medication Sig  . amLODipine (NORVASC) 5 MG tablet Take 1 tablet (5 mg total) by mouth daily.  . B Complex-C (B-COMPLEX WITH VITAMIN C) tablet Take 1 tablet by mouth daily.  . Biotin 10000 MCG TABS Take 1 tablet by mouth daily.  . carvedilol (COREG CR) 20 MG 24 hr capsule TAKE 1 CAPSULE DAILY  . Cholecalciferol 5000 units TABS Take 5,000 Units by mouth daily.  . Colchicine 0.6 MG CAPS TAKE 1 CAPSULE DAILY AS    NEEDED  . Cyanocobalamin (VITAMIN B-12) 2500 MCG SUBL Take  2,500 mcg by mouth daily.  . diclofenac Sodium (VOLTAREN) 1 % GEL APPLY 2 GRAMS TO AFFECTED AREA(S) 4 TIMES DAILY AS NEEDED  . docusate sodium (COLACE) 100 MG capsule Take 1 capsule (100 mg total) by mouth 2 (two) times daily.  . famotidine (PEPCID) 20 MG tablet Take 20 mg by mouth daily. PRN  . gabapentin (NEURONTIN) 300 MG capsule Take 600 mg by mouth at bedtime. Pt taking 600 MG at bedtime  . glucose blood (ONETOUCH VERIO) test strip Use as instructed to check blood sugars 2 times per day dx: e11.65  . insulin degludec (TRESIBA FLEXTOUCH) 100 UNIT/ML SOPN FlexTouch Pen Inject 0.09 mLs (9 Units total) into the skin daily at 10 pm. (Patient taking differently: Inject 8 Units into the skin daily at 10 pm.)  . losartan (COZAAR) 100 MG tablet Take 1 tablet (100 mg total) by mouth daily.  . Multiple Vitamins-Minerals (MULTIVITAMIN WITH MINERALS) tablet Take 1 tablet by mouth daily. Women's 50+  . naproxen sodium (ANAPROX) 220 MG tablet Take 440 mg by mouth 2 (two) times daily as needed (pain). 2 tablets every night at bedtime  . OneTouch Delica Lancets 49S MISC Use as instructed to check blood sugars 2 times per day dx: e11.65  . OZEMPIC, 1 MG/DOSE, 2 MG/1.5ML SOPN INJECT INTO SKIN ONCE A WEEK (Patient taking differently: 1mg )  . simvastatin (ZOCOR) 10 MG tablet Take 1 tablet (10 mg total) by mouth daily.  [name] SYNTHROID 25 MCG tablet Take 0.5 tablet (12.5 mcg) by mouth  on Mondays through Fridays, then take 1 tablet (25 mcg) by mouth on Saturdays and Sundays.   No facility-administered encounter medications on file as of 05/22/2020.    Patient Active Problem List   Diagnosis Date Noted  . Type 2 diabetes mellitus with stage 3 chronic kidney disease, with long-term current use of insulin ([place]) 11/29/2019  . Panniculitis 09/30/2019  . Back pain 09/30/2019  . Hepatoma ([place]) 01/21/2019  . Other cirrhosis of liver ([place]) 07/05/2018  . Hypertensive nephropathy 07/05/2018  . Chronic renal disease, stage  III ([place]) 07/05/2018  . Chronic left shoulder pain 07/05/2018  . Hypothyroidism 02/06/2018  . Hepatitis C virus infection cured after antiviral drug therapy 02/04/2018  . Osteoarthritis of right knee 07/30/2017  . S/P laparoscopic sleeve gastrectomy July 2018 08/26/2016  . Preop cardiovascular exam 07/30/2016  . Dyspnea on exertion 07/30/2016  . Class 2 severe obesity due to excess calories with serious comorbidity and body mass index (BMI) of 39.0 to 39.9 in adult ([place]) 07/30/2016  . Bilateral lower extremity edema 07/30/2016    Conditions to be addressed/monitored: HTN, DMII and CKD Stage III  Care Plan : Social Work Care Plan  Updates made by Daneen Schick since 05/22/2020 12:00 AM    Problem: Care Coordination     Long-Range Goal: Collaborate with RN Care Manager to perform appropriate assessments to assist with care coordination needs   Start Date: 03/02/2020  Expected End Date: 06/30/2020  Recent Progress: On track  Priority: Low  Note:   Current Barriers:  . Chronic conditions including DM II, CKD III, and HTN which put patient at increased risk of hospitalization  Social Work Clinical Goal(s):  [name] Over the next 120 days, patient will work with SW to address concerns related to care coordination  Interventions: . 1:1 collaboration with Teresa Chard, MD regarding development and update of comprehensive plan of care as evidenced by provider attestation and co-signature . Inter-disciplinary care team collaboration (see longitudinal plan of care) . Successful outbound call placed to the patient to assist with care coordination needs . Discussed the patient is still on track to begin college in August of 2022 to further her education in Psychology . Determined the patient is actively working with [facility] to identify funding sources and schools the patient will be eligible to attend . Patient reports she can no longer afford her prescription for Carvedilol due to tier  class - patient reports it will be $200 for a 90 day supply . Performed chart review to note the patient has spoken with her PCP about this concern and has worked with the pharmacy team to determine if she is eligible for patient assistance - unfortunately, the patient is not . Discussed the patient has roughly a 30 days supply left and would like to know what the plan is once this medication runs out . Noted the patient has an appointment with [name] on 4.12.22 . Advised the patient SW would outreach the pharmacy team to request patient feedback regarding this concern - Discussed she may not be able to speak with the pharmacist prior to 4.12.22 due to scheduling - patient stated understanding . Collaboration with pharmacy team via in-basket message to request patient follow up  Patient Goals/Self-Care Activities Over the next 60 days, patient will:   - Patient will self administer medications as prescribed Patient will attend all scheduled provider appointments Patient will call provider office for new concerns or questions Contact SW as needed prior to next scheduled  call  Follow up Plan: SW will follow up with patient by phone over the next 21 days       Follow Up Plan: SW will follow up with patient by phone over the next 21 days.      Daneen Schick, BSW, CDP Social Worker, Certified Dementia Practitioner [place] [facility] [phone]  Total time spent performing care coordination and/or care management activities with the patient by phone or face to face = 39 minutes.

## 2020-05-22 NOTE — Chronic Care Management (AMB) (Addendum)
Chronic Care Management Pharmacy Assistant   Name: Teresa Golden  MRN: [id number] DOB: 1945-03-27  Reason for Encounter: Patient Assistance Coordination   05/22/2020- Left message for patient to return call regarding medication change and follow up from CCM social worker visit concerns: Good morning ladies. I just spoke with Mrs. Weatherly who expresses concern over Carvedilol. I know she has spoken with Candace about this in the past. Long story short the type of medication she is on now has been changed to a different tier under her coverage and will cost roughly $200 for a 90 day supply. Candace attempted to qualify patient for patient assistance but Mrs. Bena reports that did not work.   She has about 30 pills give or take left. She contacted Dr. Baird Cancer on 3/15 via my chart message to request an alternative medication be prescribed. Dr. Baird Cancer replied via my chart indicating she would follow up with [facility].   I let Mrs. Sessa know she has a scheduled appt with Vallie on 4/12 and that she may not be able to speak to her sooner pending schedule availability but she is very anxious about running out of this medication without knowing a plan. Is there any way that Candace or another assistant can call Mrs. Mo to assure her that you are aware of this need and plan to address it?    Patient has a visit with [name], CPP on 06/05/2020 to discuss options and discussion with PCP Dr Baird Cancer in process with PharmD.   Medications: Outpatient Encounter Medications as of 05/22/2020  Medication Sig   amLODipine (NORVASC) 5 MG tablet Take 1 tablet (5 mg total) by mouth daily.   B Complex-C (B-COMPLEX WITH VITAMIN C) tablet Take 1 tablet by mouth daily.   Biotin 10000 MCG TABS Take 1 tablet by mouth daily.   carvedilol (COREG CR) 20 MG 24 hr capsule TAKE 1 CAPSULE DAILY   Cholecalciferol 5000 units TABS Take 5,000 Units by mouth daily.   Colchicine 0.6 MG CAPS TAKE 1 CAPSULE DAILY AS    NEEDED    Cyanocobalamin (VITAMIN B-12) 2500 MCG SUBL Take 2,500 mcg by mouth daily.   diclofenac Sodium (VOLTAREN) 1 % GEL APPLY 2 GRAMS TO AFFECTED AREA(S) 4 TIMES DAILY AS NEEDED   docusate sodium (COLACE) 100 MG capsule Take 1 capsule (100 mg total) by mouth 2 (two) times daily.   famotidine (PEPCID) 20 MG tablet Take 20 mg by mouth daily. PRN   gabapentin (NEURONTIN) 300 MG capsule Take 600 mg by mouth at bedtime. Pt taking 600 MG at bedtime   glucose blood (ONETOUCH VERIO) test strip Use as instructed to check blood sugars 2 times per day dx: e11.65   insulin degludec (TRESIBA FLEXTOUCH) 100 UNIT/ML SOPN FlexTouch Pen Inject 0.09 mLs (9 Units total) into the skin daily at 10 pm. (Patient taking differently: Inject 8 Units into the skin daily at 10 pm.)   losartan (COZAAR) 100 MG tablet Take 1 tablet (100 mg total) by mouth daily.   Multiple Vitamins-Minerals (MULTIVITAMIN WITH MINERALS) tablet Take 1 tablet by mouth daily. Women's 50+   naproxen sodium (ANAPROX) 220 MG tablet Take 440 mg by mouth 2 (two) times daily as needed (pain). 2 tablets every night at bedtime   OneTouch Delica Lancets 76L MISC Use as instructed to check blood sugars 2 times per day dx: e11.65   OZEMPIC, 1 MG/DOSE, 2 MG/1.5ML SOPN INJECT INTO SKIN ONCE A WEEK (Patient taking differently: 1mg )  simvastatin (ZOCOR) 10 MG tablet Take 1 tablet (10 mg total) by mouth daily.   SYNTHROID 25 MCG tablet Take 0.5 tablet (12.5 mcg) by mouth on Mondays through Fridays, then take 1 tablet (25 mcg) by mouth on Saturdays and Sundays.   No facility-administered encounter medications on file as of 05/22/2020.    Star Rating Drugs: Simvastatin 10 mg- Last filled 06/01/2020 for 90 day supply at [facility]. Ozempic- Last filled 07/12/2018 for 30 day supply at [facility]. Losartan Potassium- Last filled 05/08/2020 for 90 day supply from [facility]: [name], [place] [phone]  I have reviewed the care management and  care coordination activities outlined in this encounter and I am certifying that I agree with the content of this note. No further action required.  Mayford Knife, [facility] 06/26/20 9:03 AM

## 2020-05-22 NOTE — Patient Instructions (Signed)
Social Worker Visit Information  Goals we discussed today:  Goals Addressed            This Visit's Progress   . Work with SW to manage care coordination needs       Timeframe:  Long-Range Goal Priority:  [employer] Start Date:   12.16.21                          Expected End Date:   3.16.22                    Next date of contact: 4.11.22  Patient Goals/Self-Care Activities Over the next 45 days patient will:   - Patient will self administer medications as prescribed -Patient will attend all scheduled provider appointments -Patient will call provider office for new concerns or questions -Contact SW as needed prior to next scheduled call         Follow Up Plan: SW will follow up with patient by phone over the next 21 days.   Daneen Schick, BSW, CDP Social Worker, Certified Dementia Practitioner [place] [facility] [phone]

## 2020-05-24 ENCOUNTER — Ambulatory Visit (INDEPENDENT_AMBULATORY_CARE_PROVIDER_SITE_OTHER): Payer: Medicare HMO | Admitting: Addiction (Substance Use Disorder)

## 2020-05-24 ENCOUNTER — Other Ambulatory Visit: Payer: Self-pay

## 2020-05-24 DIAGNOSIS — F4323 Adjustment disorder with mixed anxiety and depressed mood: Secondary | ICD-10-CM | POA: Diagnosis not present

## 2020-05-24 NOTE — Progress Notes (Signed)
      [facility] Counselor/Therapist Progress Note  [name], MRN: [id number],    Date: 05/24/2020   Time Spent:  71mins  Treatment Type: Individual Therapy  Reported Symptoms: motviated  Mental Status Exam:  Appearance:   Casual     Behavior:  Appropriate and Sharing  Motor:  Normal  Speech/Language:   Clear and Coherent  Affect:  Appropriate  Mood:  normal  Thought process:  normal  Thought content:    Rumination  Sensory/Perceptual disturbances:    WNL  Orientation:  x4  Attention:  Good  Concentration:  Good  Memory:  WNL  Fund of knowledge:   Good  Insight:    Good  Judgment:   Good  Impulse Control:  Good   Risk Assessment: Danger to Self:  No Self-injurious Behavior: No Danger to Others: No Duty to Warn:no Physical Aggression / Violence:No  Access to Firearms a concern: No  Gang Involvement:No   Subjective: Client reported a little less stress but still some high blood pressure, related to getting her townhome the way she needs it. Client expressed her expectations asking for feedback and therapist used MI to confirm client has reasonable expectations for her landlord and men in her life. Client processed pain from dating and her desire to stay away from those relationships right now to protect herself and help her cope with life stressors. Client expressed some upcoming concerns relating going back to school with [employer] paying and hoops she has to jump through along with her expectations and desires. Therapist assessed for stability and client denied SI/HI/AVH.   Interventions: Motivational Interviewing and RPT   Diagnosis:   ICD-10-CM   1. Adjustment disorder with mixed anxiety and depressed mood  F43.23     Plan of Care:  Client is to return to therapy with therapist every 1-2 weeks as needed to process traumas/ frustrations in a safe space, to be re-evaluated in 3 months.  Client is to practice mindfulness AEB daily meditation and body scans  or as needed when flooded by emotion/pain.  Client is to learn/practice DBT wise mind & radical acceptance. Client is to practice self-compassion AEB being gentle with themselves, utilizing self-care techniques daily or as needed when grieving something in the moment.  Client is to process grief/pain of their loss in a somatic body-felt sense way: ie using mindfulness, brainspotting, or trauma release as a method for releasing body pain/tension caused by grief.  Barnie Del, LCSW, LCAS, CCTP, CCS-I, BSP

## 2020-05-25 ENCOUNTER — Other Ambulatory Visit (HOSPITAL_COMMUNITY): Payer: Medicare HMO

## 2020-05-28 ENCOUNTER — Telehealth: Payer: Medicare HMO

## 2020-05-30 ENCOUNTER — Other Ambulatory Visit (HOSPITAL_COMMUNITY): Payer: Medicare HMO

## 2020-05-31 ENCOUNTER — Other Ambulatory Visit: Payer: Self-pay

## 2020-05-31 ENCOUNTER — Ambulatory Visit (INDEPENDENT_AMBULATORY_CARE_PROVIDER_SITE_OTHER): Payer: Medicare HMO | Admitting: Internal Medicine

## 2020-05-31 ENCOUNTER — Encounter: Payer: Self-pay | Admitting: Internal Medicine

## 2020-05-31 VITALS — BP 146/78 | HR 61 | Temp 98.1°F | Ht 70.0 in | Wt 214.0 lb

## 2020-05-31 DIAGNOSIS — E1122 Type 2 diabetes mellitus with diabetic chronic kidney disease: Secondary | ICD-10-CM | POA: Diagnosis not present

## 2020-05-31 DIAGNOSIS — E039 Hypothyroidism, unspecified: Secondary | ICD-10-CM | POA: Diagnosis not present

## 2020-05-31 DIAGNOSIS — R319 Hematuria, unspecified: Secondary | ICD-10-CM | POA: Diagnosis not present

## 2020-05-31 DIAGNOSIS — N390 Urinary tract infection, site not specified: Secondary | ICD-10-CM

## 2020-05-31 DIAGNOSIS — Z862 Personal history of diseases of the blood and blood-forming organs and certain disorders involving the immune mechanism: Secondary | ICD-10-CM

## 2020-05-31 DIAGNOSIS — Z8616 Personal history of COVID-19: Secondary | ICD-10-CM

## 2020-05-31 DIAGNOSIS — Z79899 Other long term (current) drug therapy: Secondary | ICD-10-CM

## 2020-05-31 DIAGNOSIS — Z6834 Body mass index (BMI) 34.0-34.9, adult: Secondary | ICD-10-CM

## 2020-05-31 DIAGNOSIS — E66811 Obesity, class 1: Secondary | ICD-10-CM

## 2020-05-31 DIAGNOSIS — Z23 Encounter for immunization: Secondary | ICD-10-CM

## 2020-05-31 DIAGNOSIS — E6609 Other obesity due to excess calories: Secondary | ICD-10-CM

## 2020-05-31 DIAGNOSIS — Z794 Long term (current) use of insulin: Secondary | ICD-10-CM

## 2020-05-31 DIAGNOSIS — N182 Chronic kidney disease, stage 2 (mild): Secondary | ICD-10-CM | POA: Diagnosis not present

## 2020-05-31 DIAGNOSIS — I129 Hypertensive chronic kidney disease with stage 1 through stage 4 chronic kidney disease, or unspecified chronic kidney disease: Secondary | ICD-10-CM

## 2020-05-31 DIAGNOSIS — Z7251 High risk heterosexual behavior: Secondary | ICD-10-CM

## 2020-05-31 DIAGNOSIS — N183 Chronic kidney disease, stage 3 unspecified: Secondary | ICD-10-CM

## 2020-05-31 LAB — POCT URINALYSIS DIPSTICK
Bilirubin, UA: NEGATIVE
Glucose, UA: NEGATIVE
Ketones, UA: NEGATIVE
Nitrite, UA: POSITIVE
Protein, UA: NEGATIVE
Spec Grav, UA: 1.02 (ref 1.010–1.025)
Urobilinogen, UA: 0.2 E.U./dL
pH, UA: 6 (ref 5.0–8.0)

## 2020-05-31 MED ORDER — PREVNAR 20 0.5 ML IM SUSY: 0.5000 mL | PREFILLED_SYRINGE | INTRAMUSCULAR | 0 refills | Status: AC

## 2020-05-31 NOTE — Patient Instructions (Signed)
Obesity, Adult Obesity is having too much body fat. Being obese means that your weight is more than what is healthy for you. BMI is a number that explains how much body fat you have. If you have a BMI of 30 or more, you are obese. Obesity is often caused by eating or drinking more calories than your body uses. Changing your lifestyle can help you lose weight. Obesity can cause serious health problems, such as:  Stroke.  Coronary artery disease (CAD).  Type 2 diabetes.  Some types of cancer, including cancers of the colon, breast, uterus, and gallbladder.  Osteoarthritis.  High blood pressure (hypertension).  High cholesterol.  Sleep apnea.  Gallbladder stones.  Infertility problems. What are the causes?  Eating meals each day that are high in calories, sugar, and fat.  Being born with genes that may make you more likely to become obese.  Having a medical condition that causes obesity.  Taking certain medicines.  Sitting a lot (having a sedentary lifestyle).  Not getting enough sleep.  Drinking a lot of drinks that have sugar in them. What increases the risk?  Having a family history of obesity.  Being an African American woman.  Being a Hispanic man.  Living in an area with limited access to: ? Parks, recreation centers, or sidewalks. ? Healthy food choices, such as grocery stores and farmers' markets. What are the signs or symptoms? The main sign is having too much body fat. How is this treated?  Treatment for this condition often includes changing your lifestyle. Treatment may include: ? Changing your diet. This may include making a healthy meal plan. ? Exercise. This may include activity that causes your heart to beat faster (aerobic exercise) and strength training. Work with your doctor to design a program that works for you. ? Medicine to help you lose weight. This may be used if you are not able to lose 1 pound a week after 6 weeks of healthy eating and  more exercise. ? Treating conditions that cause the obesity. ? Surgery. Options may include gastric banding and gastric bypass. This may be done if:  Other treatments have not helped to improve your condition.  You have a BMI of 40 or higher.  You have life-threatening health problems related to obesity. Follow these instructions at home: Eating and drinking  Follow advice from your doctor about what to eat and drink. Your doctor may tell you to: ? Limit fast food, sweets, and processed snack foods. ? Choose low-fat options. For example, choose low-fat milk instead of whole milk. ? Eat 5 or more servings of fruits or vegetables each day. ? Eat at home more often. This gives you more control over what you eat. ? Choose healthy foods when you eat out. ? Learn to read food labels. This will help you learn how much food is in 1 serving. ? Keep low-fat snacks available. ? Avoid drinks that have a lot of sugar in them. These include soda, fruit juice, iced tea with sugar, and flavored milk.  Drink enough water to keep your pee (urine) pale yellow.  Do not go on fad diets.   Physical activity  Exercise often, as told by your doctor. Most adults should get up to 150 minutes of moderate-intensity exercise every week.Ask your doctor: ? What types of exercise are safe for you. ? How often you should exercise.  Warm up and stretch before being active.  Do slow stretching after being active (cool down).  Rest   between times of being active. Lifestyle  Work with your doctor and a food expert (dietitian) to set a weight-loss goal that is best for you.  Limit your screen time.  Find ways to reward yourself that do not involve food.  Do not drink alcohol if: ? Your doctor tells you not to drink. ? You are pregnant, may be pregnant, or are planning to become pregnant.  If you drink alcohol: ? Limit how much you use to:  0-1 drink a day for women.  0-2 drinks a day for men. ? Be  aware of how much alcohol is in your drink. In the [place] one drink equals one 12 oz bottle of beer (355 mL), one 5 oz glass of wine (148 mL), or one 1 oz glass of hard liquor (44 mL). General instructions  Keep a weight-loss journal. This can help you keep track of: ? The food that you eat. ? How much exercise you get.  Take over-the-counter and prescription medicines only as told by your doctor.  Take vitamins and supplements only as told by your doctor.  Think about joining a support group.  Keep all follow-up visits as told by your doctor. This is important. Contact a doctor if:  You cannot meet your weight loss goal after you have changed your diet and lifestyle for 6 weeks. Get help right away if you:  Are having trouble breathing.  Are having thoughts of harming yourself. Summary  Obesity is having too much body fat.  Being obese means that your weight is more than what is healthy for you.  Work with your doctor to set a weight-loss goal.  Get regular exercise as told by your doctor. This information is not intended to replace advice given to you by your health care provider. Make sure you discuss any questions you have with your health care provider. Document Revised: 10/15/2017 Document Reviewed: 10/15/2017 Elsevier Patient Education  2021 [place]. Hypertension, Adult Hypertension is another name for high blood pressure. High blood pressure forces your heart to work harder to pump blood. This can cause problems over time. There are two numbers in a blood pressure reading. There is a top number (systolic) over a bottom number (diastolic). It is best to have a blood pressure that is below 120/80. Healthy choices can help lower your blood pressure, or you may need medicine to help lower it. What are the causes? The cause of this condition is not known. Some conditions may be related to high blood pressure. What increases the risk?  Smoking.  Having type 2  diabetes mellitus, high cholesterol, or both.  Not getting enough exercise or physical activity.  Being overweight.  Having too much fat, sugar, calories, or salt (sodium) in your diet.  Drinking too much alcohol.  Having long-term (chronic) kidney disease.  Having a family history of high blood pressure.  Age. Risk increases with age.  Race. You may be at higher risk if you are African American.  Gender. Men are at higher risk than women before age 56. After age 31, women are at higher risk than men.  Having obstructive sleep apnea.  Stress. What are the signs or symptoms?  High blood pressure may not cause symptoms. Very high blood pressure (hypertensive crisis) may cause: ? Headache. ? Feelings of worry or nervousness (anxiety). ? Shortness of breath. ? Nosebleed. ? A feeling of being sick to your stomach (nausea). ? Throwing up (vomiting). ? Changes in how you see. ? Very  bad chest pain. ? Seizures. How is this treated?  This condition is treated by making healthy lifestyle changes, such as: ? Eating healthy foods. ? Exercising more. ? Drinking less alcohol.  Your health care provider may prescribe medicine if lifestyle changes are not enough to get your blood pressure under control, and if: ? Your top number is above 130. ? Your bottom number is above 80.  Your personal target blood pressure may vary. Follow these instructions at home: Eating and drinking  If told, follow the DASH eating plan. To follow this plan: ? Fill one half of your plate at each meal with fruits and vegetables. ? Fill one fourth of your plate at each meal with whole grains. Whole grains include whole-wheat pasta, brown rice, and whole-grain bread. ? Eat or drink low-fat dairy products, such as skim milk or low-fat yogurt. ? Fill one fourth of your plate at each meal with low-fat (lean) proteins. Low-fat proteins include fish, chicken without skin, eggs, beans, and tofu. ? Avoid fatty  meat, cured and processed meat, or chicken with skin. ? Avoid pre-made or processed food.  Eat less than 1,500 mg of salt each day.  Do not drink alcohol if: ? Your doctor tells you not to drink. ? You are pregnant, may be pregnant, or are planning to become pregnant.  If you drink alcohol: ? Limit how much you use to:  0-1 drink a day for women.  0-2 drinks a day for men. ? Be aware of how much alcohol is in your drink. In the [place] one drink equals one 12 oz bottle of beer (355 mL), one 5 oz glass of wine (148 mL), or one 1 oz glass of hard liquor (44 mL).   Lifestyle  Work with your doctor to stay at a healthy weight or to lose weight. Ask your doctor what the best weight is for you.  Get at least 30 minutes of exercise most days of the week. This may include walking, swimming, or biking.  Get at least 30 minutes of exercise that strengthens your muscles (resistance exercise) at least 3 days a week. This may include lifting weights or doing Pilates.  Do not use any products that contain nicotine or tobacco, such as cigarettes, e-cigarettes, and chewing tobacco. If you need help quitting, ask your doctor.  Check your blood pressure at home as told by your doctor.  Keep all follow-up visits as told by your doctor. This is important.   Medicines  Take over-the-counter and prescription medicines only as told by your doctor. Follow directions carefully.  Do not skip doses of blood pressure medicine. The medicine does not work as well if you skip doses. Skipping doses also puts you at risk for problems.  Ask your doctor about side effects or reactions to medicines that you should watch for. Contact a doctor if you:  Think you are having a reaction to the medicine you are taking.  Have headaches that keep coming back (recurring).  Feel dizzy.  Have swelling in your ankles.  Have trouble with your vision. Get help right away if you:  Get a very bad headache.  Start to  feel mixed up (confused).  Feel weak or numb.  Feel faint.  Have very bad pain in your: ? Chest. ? Belly (abdomen).  Throw up more than once.  Have trouble breathing. Summary  Hypertension is another name for high blood pressure.  High blood pressure forces your heart to work harder to  pump blood.  For most people, a normal blood pressure is less than 120/80.  Making healthy choices can help lower blood pressure. If your blood pressure does not get lower with healthy choices, you may need to take medicine. This information is not intended to replace advice given to you by your health care provider. Make sure you discuss any questions you have with your health care provider. Document Revised: 10/21/2017 Document Reviewed: 10/21/2017 Elsevier Patient Education  2021 [employer].

## 2020-05-31 NOTE — Progress Notes (Signed)
This visit occurred during the SARS-CoV-2 public health emergency.  Safety protocols were in place, including screening questions prior to the visit, additional usage of staff PPE, and extensive cleaning of exam room while observing appropriate contact time as indicated for disinfecting solutions.  Subjective:     [name] , female    DOB: May 30, 1945 , 75 y.o.   MRN: [id number]   Chief Complaint  Patient presents with  . Hypertension  . Diabetes    HPI  Pt is here today for a b/p and DM check.  She reports compliance with all medications. She denies headaches, chest pain and shortness of breath.  Hypertension This is a chronic problem. The current episode started more than 1 year ago. The problem has been gradually improving since onset. The problem is uncontrolled. Pertinent negatives include no blurred vision, chest pain or headaches. Risk factors for coronary artery disease include diabetes mellitus, dyslipidemia, post-menopausal state and sedentary lifestyle. Past treatments include angiotensin blockers and beta blockers. The current treatment provides moderate improvement.  Diabetes She presents for her follow-up diabetic visit. She has type 2 diabetes mellitus. Pertinent negatives for hypoglycemia include no dizziness or headaches. Pertinent negatives for diabetes include no blurred vision, no chest pain, no fatigue, no polydipsia, no polyphagia and no polyuria. There are no hypoglycemic complications. Risk factors for coronary artery disease include diabetes mellitus, dyslipidemia, hypertension, obesity, sedentary lifestyle and post-menopausal. She is following a diabetic diet. She participates in exercise intermittently. Her home blood glucose trend is fluctuating minimally. Her breakfast blood glucose is taken between 8-9 am. Her breakfast blood glucose range is generally 110-130 mg/dl. An ACE inhibitor/angiotensin II receptor blocker is being taken. Eye exam is current.      Past Medical History:  Diagnosis Date  . Arthritis   . Chronic kidney disease    self reports ckd stage 3   . Diabetes mellitus, type II, insulin dependent ([facility])    With neurologic complications. Bilateral lower extremity peripheral neuropathy  . Dyspnea    with excertion; no issues now since weight loss surgery   . Endometriosis   . Essential hypertension   . GERD (gastroesophageal reflux disease)   . 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 Hypertensive nephropathy 07/05/2018  . Hypothyroidism   . Hypothyroidism 02/06/2018  . Morbid obesity with BMI of 50.0-59.9, adult ([facility])   . Scoliosis      Family History  Problem Relation Age of Onset  . Heart attack Mother   . Heart failure Mother   . Stroke Father      Current Outpatient Medications:  .  amLODipine (NORVASC) 5 MG tablet, Take 1 tablet (5 mg total) by mouth daily., Disp: 90 tablet, Rfl: 1 .  B Complex-C (B-COMPLEX WITH VITAMIN C) tablet, Take 1 tablet by mouth daily., Disp: , Rfl:  .  Biotin 10000 MCG TABS, Take 1 tablet by mouth daily., Disp: , Rfl:  .  carvedilol (COREG CR) 20 MG 24 hr capsule, TAKE 1 CAPSULE DAILY, Disp: 90 capsule, Rfl: 2 .  Cholecalciferol 5000 units TABS, Take 5,000 Units by mouth daily., Disp: , Rfl:  .  Colchicine 0.6 MG CAPS, TAKE 1 CAPSULE DAILY AS    NEEDED, Disp: 90 capsule, Rfl: 1 .  Cyanocobalamin (VITAMIN B-12) 2500 MCG SUBL, Take 2,500 mcg by mouth daily., Disp: , Rfl:  .  diclofenac Sodium (VOLTAREN) 1 % GEL, APPLY 2 GRAMS TO  AFFECTED AREA(S) 4 TIMES DAILY AS NEEDED, Disp: 200 g, Rfl: 2 .  gabapentin (NEURONTIN) 300 MG capsule, Take 600 mg by mouth at bedtime. Pt taking 600 MG at bedtime, Disp: , Rfl:  .  glucose blood (ONETOUCH VERIO) test strip, Use as instructed to check blood sugars 2 times per day dx: e11.65, Disp: 300 each, Rfl: 2 .  insulin degludec (TRESIBA FLEXTOUCH) 100 UNIT/ML SOPN FlexTouch Pen, Inject 0.09 mLs (9 Units total) into  the skin daily at 10 pm. (Patient taking differently: Inject 8 Units into the skin daily at 10 pm.), Disp: 5 pen, Rfl: 2 .  losartan (COZAAR) 100 MG tablet, Take 1 tablet (100 mg total) by mouth daily., Disp: 90 tablet, Rfl: 2 .  Multiple Vitamins-Minerals (MULTIVITAMIN WITH MINERALS) tablet, Take 1 tablet by mouth daily. Women's 50+, Disp: , Rfl:  .  naproxen sodium (ANAPROX) 220 MG tablet, Take 440 mg by mouth 2 (two) times daily as needed (pain). 2 tablets every night at bedtime, Disp: , Rfl:  .  OneTouch Delica Lancets 40H MISC, Use as instructed to check blood sugars 2 times per day dx: e11.65, Disp: 300 each, Rfl: 2 .  OZEMPIC, 1 MG/DOSE, 2 MG/1.5ML SOPN, INJECT INTO SKIN ONCE A WEEK (Patient taking differently: 1mg ), Disp: 9 pen, Rfl: 1 .  simvastatin (ZOCOR) 10 MG tablet, Take 1 tablet (10 mg total) by mouth daily., Disp: 90 tablet, Rfl: 1 .  SYNTHROID 25 MCG tablet, Take 0.5 tablet (12.5 mcg) by mouth on Mondays through Fridays, then take 1 tablet (25 mcg) by mouth on Saturdays and Sundays., Disp: 90 tablet, Rfl: 1 .  cephALEXin (KEFLEX) 500 MG capsule, Take 1 capsule (500 mg total) by mouth 3 (three) times daily., Disp: 21 capsule, Rfl: 0 .  docusate sodium (COLACE) 100 MG capsule, Take 1 capsule (100 mg total) by mouth 2 (two) times daily., Disp: 60 capsule, Rfl: 1 .  famotidine (PEPCID) 20 MG tablet, Take 20 mg by mouth daily. PRN, Disp: , Rfl:  .  metoprolol succinate (TOPROL-XL) 25 MG 24 hr tablet, Take 1/2 tablet by mouth daily at bedtime  for 6 days and then 1 tablet by mouth daily at bedtime, Disp: 30 tablet, Rfl: 1   Allergies  Allergen Reactions  . Meloxicam Other (See Comments)    Fever; muscle aches; "flu-like" symptoms  . Other     Rose fever and hay fever      Review of Systems  Constitutional: Negative.  Negative for fatigue.  HENT: Negative.   Eyes: Negative for blurred vision.  Respiratory: Negative.   Cardiovascular: Negative.  Negative for chest pain.   Endocrine: Negative for polydipsia, polyphagia and polyuria.  Genitourinary:       Wants to be checked for all STDs. She denies having vaginal discharge.  Musculoskeletal: Negative.   Skin: Negative.   Neurological: Negative for dizziness and headaches.  Psychiatric/Behavioral: Negative.      Today's Vitals   05/31/20 0946  BP: (!) 146/78  Pulse: 61  Temp: 98.1 F (36.7 C)  TempSrc: Oral  Weight: 214 lb (97.1 kg)  Height: 5\' 10"  (1.778 m)   Body mass index is 30.71 kg/m.  Wt Readings from Last 3 Encounters:  05/31/20 214 lb (97.1 kg)  05/07/20 210 lb (95.3 kg)  03/30/20 212 lb 9.6 oz (96.4 kg)   Objective:  Physical Exam Vitals reviewed.  Constitutional:      General: She is not in acute distress.    Appearance: Normal appearance.  She is obese.  HENT:     Head: Normocephalic and atraumatic.     Nose:     Comments: Masked     Mouth/Throat:     Comments: Masked  Eyes:     Extraocular Movements: Extraocular movements intact.  Cardiovascular:     Rate and Rhythm: Normal rate and regular rhythm.     Heart sounds: Normal heart sounds. No murmur heard.   Pulmonary:     Effort: Pulmonary effort is normal. No respiratory distress.     Breath sounds: Normal breath sounds. No wheezing.  Musculoskeletal:     Comments: Ambulatory with walker  Skin:    General: Skin is warm and dry.     Capillary Refill: Capillary refill takes less than 2 seconds.  Neurological:     Mental Status: She is alert and oriented to person, place, and time.     Cranial Nerves: No cranial nerve deficit.  Psychiatric:        Mood and Affect: Mood normal.        Behavior: Behavior normal.        Thought Content: Thought content normal.        Judgment: Judgment normal.         Assessment And Plan:     1. Hypertensive nephropathy Comments: Chronic, uncontrolled. Advised to follow low sodium diet. Encouraged to limit intake of packaged foods which tend to be hihg in sodium. -  CMP14+EGFR  2. Type 2 diabetes mellitus with stage 2 chronic kidney disease, with long-term current use of insulin ([facility]) Comments: Chronic. I will check a1c and adjust meds as needed. She is encouraged to incorporate more exercise into her daily routine, including chair exercises.  - Hemoglobin A1c  3. Primary hypothyroidism Comments: I will check thyroid panel and adjust meds as needed.  - TSH  4. Urinary tract infection with hematuria, site unspecified Comments: I will send off urine culture. I will treat accordingly. Encouraged to stay well hydrated.  - POCT Urinalysis Dipstick (81002) - Culture, Urine  5. Class 1 obesity due to excess calories with serious comorbidity and body mass index (BMI) of 34.0 to 34.9 in adult Comments: She is encouraged to strive for BMI less than 30 to decrease cardiac risk.   6. High risk heterosexual behavior Comments: She wants to be checked for STIs. She reports her partner was not faithful.  - HIV antibody (with reflex) - RPR - Hepatitis B Surface Antigen - Hepatitis C antibody - Chlamydia/Gonococcus/Trichomonas, NAA  7. Drug therapy Comments: I will check vitamin B12 level.  - Vitamin B12  8. Personal history of COVID-19 Comments: She is fully vaccinated and boosted.   9. History of anemia Comments: I will check CBC and iron studies today.  - Iron and IBC ([id number]) - Ferritin - CBC no Diff  10. Immunization due Comments: I will send rx [id number] to her local pharmacy.  - pneumococcal 20-Val Conj Vacc (PREVNAR 20) 0.5 ML SUSY; Inject 0.5 mLs into the muscle tomorrow at 10 am for 1 dose.  Dispense: 0.5 mL; Refill: 0  Patient was given opportunity to ask questions. Patient verbalized understanding of the plan and was able to repeat key elements of the plan. All questions were answered to their satisfaction.   I, Maximino Greenland, MD, have reviewed all documentation for this visit. The documentation on 06/17/20 for the exam,  diagnosis, procedures, and orders are all accurate and complete.   IF YOU HAVE BEEN REFERRED TO A  SPECIALIST, IT MAY TAKE 1-2 WEEKS TO SCHEDULE/PROCESS THE REFERRAL. IF YOU HAVE NOT HEARD FROM US/SPECIALIST IN TWO WEEKS, PLEASE GIVE [place] A CALL AT [phone] X [phone].   THE PATIENT IS ENCOURAGED TO PRACTICE SOCIAL DISTANCING DUE TO THE COVID-19 PANDEMIC.

## 2020-06-01 LAB — CBC
Hematocrit: 32.8 % — ABNORMAL LOW (ref 34.0–46.6)
Hemoglobin: 11.4 g/dL (ref 11.1–15.9)
MCH: 28.4 pg (ref 26.6–33.0)
MCHC: 34.8 g/dL (ref 31.5–35.7)
MCV: 82 fL (ref 79–97)
Platelets: 201 10*3/uL (ref 150–450)
RBC: 4.01 x10E6/uL (ref 3.77–5.28)
RDW: 13.4 % (ref 11.7–15.4)
WBC: 6.4 10*3/uL (ref 3.4–10.8)

## 2020-06-01 LAB — CMP14+EGFR
ALT: 24 IU/L (ref 0–32)
AST: 26 IU/L (ref 0–40)
Albumin/Globulin Ratio: 1.4 (ref 1.2–2.2)
Albumin: 3.9 g/dL (ref 3.7–4.7)
Alkaline Phosphatase: 79 IU/L (ref 44–121)
BUN/Creatinine Ratio: 15 (ref 12–28)
BUN: 14 mg/dL (ref 8–27)
Bilirubin Total: 0.4 mg/dL (ref 0.0–1.2)
CO2: 22 mmol/L (ref 20–29)
Calcium: 9.3 mg/dL (ref 8.7–10.3)
Chloride: 105 mmol/L (ref 96–106)
Creatinine, Ser: 0.95 mg/dL (ref 0.57–1.00)
Globulin, Total: 2.8 g/dL (ref 1.5–4.5)
Glucose: 120 mg/dL — ABNORMAL HIGH (ref 65–99)
Potassium: 4.1 mmol/L (ref 3.5–5.2)
Sodium: 141 mmol/L (ref 134–144)
Total Protein: 6.7 g/dL (ref 6.0–8.5)
eGFR: 62 mL/min/{1.73_m2} (ref >59)

## 2020-06-01 LAB — HEMOGLOBIN A1C
Est. average glucose Bld gHb Est-mCnc: 131 mg/dL
Hgb A1c MFr Bld: 6.2 % — ABNORMAL HIGH (ref 4.8–5.6)

## 2020-06-01 LAB — IRON AND TIBC
Iron Saturation: 31 % (ref 15–55)
Iron: 94 ug/dL (ref 27–139)
Total Iron Binding Capacity: 302 ug/dL (ref 250–450)
UIBC: 208 ug/dL (ref 118–369)

## 2020-06-01 LAB — HEPATITIS B SURFACE ANTIGEN: Hepatitis B Surface Ag: NEGATIVE

## 2020-06-01 LAB — VITAMIN B12: Vitamin B-12: >2000 pg/mL — ABNORMAL HIGH (ref 232–1245)

## 2020-06-01 LAB — HEPATITIS C ANTIBODY: Hep C Virus Ab: >11 s/co ratio — ABNORMAL HIGH (ref 0.0–0.9)

## 2020-06-01 LAB — FERRITIN: Ferritin: 209 ng/mL — ABNORMAL HIGH (ref 15–150)

## 2020-06-01 LAB — RPR: RPR Ser Ql: NONREACTIVE

## 2020-06-01 LAB — HIV ANTIBODY (ROUTINE TESTING W REFLEX): HIV Screen 4th Generation wRfx: NONREACTIVE

## 2020-06-01 LAB — TSH: TSH: 2.84 u[IU]/mL (ref 0.450–4.500)

## 2020-06-02 LAB — CHLAMYDIA/GONOCOCCUS/TRICHOMONAS, NAA
Chlamydia by NAA: NEGATIVE
Gonococcus by NAA: NEGATIVE
Trich vag by NAA: NEGATIVE

## 2020-06-03 LAB — URINE CULTURE

## 2020-06-04 ENCOUNTER — Other Ambulatory Visit: Payer: Self-pay | Admitting: Internal Medicine

## 2020-06-04 ENCOUNTER — Telehealth: Payer: Self-pay

## 2020-06-04 ENCOUNTER — Ambulatory Visit (INDEPENDENT_AMBULATORY_CARE_PROVIDER_SITE_OTHER): Payer: Medicare HMO

## 2020-06-04 DIAGNOSIS — E1122 Type 2 diabetes mellitus with diabetic chronic kidney disease: Secondary | ICD-10-CM

## 2020-06-04 DIAGNOSIS — Z1231 Encounter for screening mammogram for malignant neoplasm of breast: Secondary | ICD-10-CM

## 2020-06-04 DIAGNOSIS — I129 Hypertensive chronic kidney disease with stage 1 through stage 4 chronic kidney disease, or unspecified chronic kidney disease: Secondary | ICD-10-CM

## 2020-06-04 DIAGNOSIS — Z794 Long term (current) use of insulin: Secondary | ICD-10-CM

## 2020-06-04 NOTE — Patient Instructions (Signed)
Social Worker Visit Information  Goals we discussed today:  Goals Addressed            This Visit's Progress   . Work with SW to manage care coordination needs       Timeframe:  Long-Range Goal Priority:  [employer] Start Date:   12.16.21                          Expected End Date:   3.16.22                    Next date of contact: 5.11.22  Patient Goals/Self-Care Activities Over the next 45 days patient will:   - Patient will self administer medications as prescribed -Patient will attend all scheduled provider appointments -Patient will call provider office for new concerns or questions -Contact SW as needed prior to next scheduled call        Follow Up Plan: SW will follow up with patient by phone over the next month   Daneen Schick, BSW, CDP Social Worker, Certified Dementia Practitioner [facility] [phone]

## 2020-06-04 NOTE — Progress Notes (Signed)
06/04/20-Called and reminded the patient for CCM Call appointment on 06/05/20 at 2:00 PM with [name], CPP. Patient voiced understanding. Informed the patient to have medications and supplements near during phone visit.   Patient verbalized Dr.Sanders spoke with her during recent office visit on 05/31/20 about possibly switching the Carvedilol dosage and change of therapy so she may not be stuck with a high co-pay. Patient stated she will speak with CPP on what was discussed.   [name], CPP Notified.  Raynelle Highland, [place] Pharmacist Assistant [phone] CCM Total Time: 11 minutes

## 2020-06-04 NOTE — Chronic Care Management (AMB) (Signed)
Chronic Care Management    Social Work Note  06/04/2020 Name: MAJEL GIEL MRN: [id number] DOB: 1946/02/13  NELLA BOTSFORD is a 75 y.o. year old female who is a primary care patient of Glendale Chard, MD. The CCM team was consulted to assist the patient with chronic disease management and/or care coordination needs related to: [employer] .   Engaged with patient by telephone for follow up visit in response to provider referral for social work chronic care management and care coordination services.   Consent to Services:  The patient was given information about Chronic Care Management services, agreed to services, and gave verbal consent prior to initiation of services.  Please see initial visit note for detailed documentation.   Patient agreed to services and consent obtained.   Assessment: Review of patient past medical history, allergies, medications, and health status, including review of relevant consultants reports was performed today as part of a comprehensive evaluation and provision of chronic care management and care coordination services.     SDOH (Social Determinants of Health) assessments and interventions performed:    Advanced Directives Status: Not addressed in this encounter.  CCM Care Plan  Allergies  Allergen Reactions  . Meloxicam Other (See Comments)    Fever; muscle aches; "flu-like" symptoms  . Other     Rose fever and hay fever     Outpatient Encounter Medications as of 06/04/2020  Medication Sig  . amLODipine (NORVASC) 5 MG tablet Take 1 tablet (5 mg total) by mouth daily.  . B Complex-C (B-COMPLEX WITH VITAMIN C) tablet Take 1 tablet by mouth daily.  . Biotin 10000 MCG TABS Take 1 tablet by mouth daily.  . carvedilol (COREG CR) 20 MG 24 hr capsule TAKE 1 CAPSULE DAILY  . Cholecalciferol 5000 units TABS Take 5,000 Units by mouth daily.  . Colchicine 0.6 MG CAPS TAKE 1 CAPSULE DAILY AS    NEEDED  . Cyanocobalamin (VITAMIN B-12) 2500 MCG SUBL Take  2,500 mcg by mouth daily.  . diclofenac Sodium (VOLTAREN) 1 % GEL APPLY 2 GRAMS TO AFFECTED AREA(S) 4 TIMES DAILY AS NEEDED  . docusate sodium (COLACE) 100 MG capsule Take 1 capsule (100 mg total) by mouth 2 (two) times daily. (Patient not taking: Reported on 05/31/2020)  . famotidine (PEPCID) 20 MG tablet Take 20 mg by mouth daily. PRN (Patient not taking: Reported on 05/31/2020)  . gabapentin (NEURONTIN) 300 MG capsule Take 600 mg by mouth at bedtime. Pt taking 600 MG at bedtime  . glucose blood (ONETOUCH VERIO) test strip Use as instructed to check blood sugars 2 times per day dx: e11.65  . insulin degludec (TRESIBA FLEXTOUCH) 100 UNIT/ML SOPN FlexTouch Pen Inject 0.09 mLs (9 Units total) into the skin daily at 10 pm. (Patient taking differently: Inject 8 Units into the skin daily at 10 pm.)  . losartan (COZAAR) 100 MG tablet Take 1 tablet (100 mg total) by mouth daily.  . Multiple Vitamins-Minerals (MULTIVITAMIN WITH MINERALS) tablet Take 1 tablet by mouth daily. Women's 50+  . naproxen sodium (ANAPROX) 220 MG tablet Take 440 mg by mouth 2 (two) times daily as needed (pain). 2 tablets every night at bedtime  . OneTouch Delica Lancets 62M MISC Use as instructed to check blood sugars 2 times per day dx: e11.65  . OZEMPIC, 1 MG/DOSE, 2 MG/1.5ML SOPN INJECT INTO SKIN ONCE A WEEK (Patient taking differently: 1mg )  . simvastatin (ZOCOR) 10 MG tablet Take 1 tablet (10 mg total) by mouth daily.  [name]  SYNTHROID 25 MCG tablet Take 0.5 tablet (12.5 mcg) by mouth on Mondays through Fridays, then take 1 tablet (25 mcg) by mouth on Saturdays and Sundays.   No facility-administered encounter medications on file as of 06/04/2020.    Patient Active Problem List   Diagnosis Date Noted  . Type 2 diabetes mellitus with stage 3 chronic kidney disease, with long-term current use of insulin ([place]) 11/29/2019  . Panniculitis 09/30/2019  . Back pain 09/30/2019  . Hepatoma ([place]) 01/21/2019  . Other cirrhosis of liver ([place])  07/05/2018  . Hypertensive nephropathy 07/05/2018  . Chronic renal disease, stage III ([place]) 07/05/2018  . Chronic left shoulder pain 07/05/2018  . Hypothyroidism 02/06/2018  . Hepatitis C virus infection cured after antiviral drug therapy 02/04/2018  . Osteoarthritis of right knee 07/30/2017  . S/P laparoscopic sleeve gastrectomy July 2018 08/26/2016  . Preop cardiovascular exam 07/30/2016  . Dyspnea on exertion 07/30/2016  . Class 2 severe obesity due to excess calories with serious comorbidity and body mass index (BMI) of 39.0 to 39.9 in adult ([place]) 07/30/2016  . Bilateral lower extremity edema 07/30/2016    Conditions to be addressed/monitored: HTN, DMII and CKD Stage III  Care Plan : Social Work Care Plan  Updates made by Daneen Schick since 06/04/2020 12:00 AM    Problem: Care Coordination     Long-Range Goal: Collaborate with RN Care Manager to perform appropriate assessments to assist with care coordination needs   Start Date: 03/02/2020  Expected End Date: 06/30/2020  Recent Progress: On track  Priority: Low  Note:   Current Barriers:  . Chronic conditions including DM II, CKD III, and HTN which put patient at increased risk of hospitalization  Social Work Clinical Goal(s):  [name] Over the next 120 days, patient will work with SW to address concerns related to care coordination  Interventions: . 1:1 collaboration with Glendale Chard, MD regarding development and update of comprehensive plan of care as evidenced by provider attestation and co-signature . Inter-disciplinary care team collaboration (see longitudinal plan of care) . Inbound call received from the patient to discuss care coordination needs . Discussed the patient will have surgery on June 6th - patient would like to discuss whether or not she will have home health in the home or need to go to rehab . Advised the patient this information will need to be discussed with her surgeon . Performed chart review to note  patient has upcomming pre-op appointment on 5.10.22- patient is aware of this appointment . Scheduled follow up call to the patient on 5.11.22 to follow up on care coordination needs post surgery . Discussed the patient is still unclear on the plan UT:[id number] costs . Advised the patient SW collaborated with [name], PharmD who as an appointment scheduled for 4.12.22 and plans to go over options . Determined the patient is concerned about abnormal lab results she recently received, specifically elevated Feritin and what that might mean . Advised the patient SW is unable to discuss lab results as that is out of scope but this SW would collaborate with RN Care Manager to inform of patients concerns . Collaboration with RN Care Manager to advise the patient would like to discuss lab results at next visit  Patient Goals/Self-Care Activities Over the next 60 days, patient will:   - Patient will self administer medications as prescribed Patient will attend all scheduled provider appointments Patient will call provider office for new concerns or questions Contact SW as needed prior to next  scheduled call  Follow up Plan: SW will follow up with patient by phone over the next 30 days       Follow Up Plan: SW will follow up with patient by phone over the next month.      Daneen Schick, BSW, CDP Social Worker, Certified Dementia Practitioner [place] [facility] [place] [facility] [phone]  Total time spent performing care coordination and/or care management activities with the patient by phone or face to face = 45 minutes.

## 2020-06-05 ENCOUNTER — Encounter: Payer: Medicare HMO | Admitting: Plastic Surgery

## 2020-06-05 ENCOUNTER — Ambulatory Visit: Payer: Medicare HMO

## 2020-06-05 ENCOUNTER — Other Ambulatory Visit: Payer: Self-pay

## 2020-06-05 ENCOUNTER — Ambulatory Visit (HOSPITAL_COMMUNITY): Payer: Medicare HMO | Attending: Internal Medicine

## 2020-06-05 ENCOUNTER — Encounter: Payer: Medicare HMO | Admitting: Surgical

## 2020-06-05 DIAGNOSIS — R9431 Abnormal electrocardiogram [ECG] [EKG]: Secondary | ICD-10-CM | POA: Diagnosis not present

## 2020-06-05 DIAGNOSIS — E1122 Type 2 diabetes mellitus with diabetic chronic kidney disease: Secondary | ICD-10-CM

## 2020-06-05 DIAGNOSIS — I129 Hypertensive chronic kidney disease with stage 1 through stage 4 chronic kidney disease, or unspecified chronic kidney disease: Secondary | ICD-10-CM

## 2020-06-05 DIAGNOSIS — N183 Chronic kidney disease, stage 3 unspecified: Secondary | ICD-10-CM | POA: Diagnosis not present

## 2020-06-05 DIAGNOSIS — N1831 Chronic kidney disease, stage 3a: Secondary | ICD-10-CM | POA: Diagnosis not present

## 2020-06-05 LAB — ECHOCARDIOGRAM COMPLETE
Area-P 1/2: 2.07 cm2
P 1/2 time: 644 msec
S' Lateral: 2.3 cm

## 2020-06-05 NOTE — Progress Notes (Signed)
Chronic Care Management Pharmacy Note  06/14/2020 Name:  Teresa Golden MRN:  [id number] DOB:  03-25-1945  Subjective: Teresa Golden is an 75 y.o. year old female who is a primary patient of Glendale Chard, MD.  The CCM team was consulted for assistance with disease management and care coordination needs.  Patient reports that she is getting 6-8 hours of sleep a night but she is still tired.   Engaged with patient by telephone for follow up visit in response to provider referral for pharmacy case management and/or care coordination services.   Consent to Services:  The patient was given information about Chronic Care Management services, agreed to services, and gave verbal consent prior to initiation of services.  Please see initial visit note for detailed documentation.   Patient Care Team: Glendale Chard, MD as PCP - General (Internal Medicine) Elouise Munroe, MD as PCP - Cardiology (Cardiology) Daneen Schick as Social Worker [place], Claudette Stapler, RN as Case Manager Mayford Knife, [facility] (Pharmacist)  Recent office visits: 05/31/2020 PCP OV  Recent consult visits: 05/07/2020 [facility] visits: None in previous 6 months  Objective:  Lab Results  Component Value Date   CREATININE 0.95 05/31/2020   BUN 14 05/31/2020   GFRNONAA 58 (L) 11/29/2019   GFRAA 67 11/29/2019   NA 141 05/31/2020   K 4.1 05/31/2020   CALCIUM 9.3 05/31/2020   CO2 22 05/31/2020   GLUCOSE 120 (H) 05/31/2020    Lab Results  Component Value Date/Time   HGBA1C 6.2 (H) 05/31/2020 10:41 AM   HGBA1C 6.7 (H) 11/29/2019 02:11 PM   MICROALBUR 10 02/09/2020 10:59 AM   MICROALBUR 10 01/17/2019 06:09 PM    Last diabetic Eye exam:  Lab Results  Component Value Date/Time   HMDIABEYEEXA No Retinopathy 01/11/2020 12:00 AM    Last diabetic Foot exam: No results found for: HMDIABFOOTEX   Lab Results  Component Value Date   CHOL 150 02/09/2020   HDL 61 02/09/2020   LDLCALC 77 02/09/2020    TRIG 55 02/09/2020   CHOLHDL 2.5 02/09/2020    Hepatic Function Latest Ref Rng & Units 05/31/2020 11/29/2019 08/18/2019  Total Protein 6.0 - 8.5 g/dL 6.7 7.0 7.1  Albumin 3.7 - 4.7 g/dL 3.9 4.0 3.9  AST 0 - 40 IU/L '26 28 28  ' ALT 0 - 32 IU/L '24 30 18  ' Alk Phosphatase 44 - 121 IU/L 79 84 94  Total Bilirubin 0.0 - 1.2 mg/dL 0.4 0.4 0.5    Lab Results  Component Value Date/Time   TSH 2.840 05/31/2020 10:41 AM   TSH 1.230 07/05/2018 11:17 AM   FREET4 1.54 07/05/2018 11:17 AM    CBC Latest Ref Rng & Units 05/31/2020 02/09/2020 01/17/2019  WBC 3.4 - 10.8 x10E3/uL 6.4 10.8 7.1  Hemoglobin 11.1 - 15.9 g/dL 11.4 11.3 11.8  Hematocrit 34.0 - 46.6 % 32.8(L) 34.0 34.5  Platelets 150 - 450 x10E3/uL 201 223 198    Lab Results  Component Value Date/Time   VD25OH 81.3 02/09/2020 02:43 PM    Clinical ASCVD: No  The 10-year ASCVD risk score Mikey Bussing DC Jr., et al., 2013) is: 33.3%   Values used to calculate the score:     Age: 80 years     Sex: Female     Is Non-Hispanic African American: Yes     Diabetic: Yes     Tobacco smoker: No     Systolic Blood Pressure: 240 mmHg     Is  BP treated: Yes     HDL Cholesterol: 61 mg/dL     Total Cholesterol: 150 mg/dL    Depression screen [facility] 2/9 03/15/2020 08/18/2019 02/08/2019  Decreased Interest 0 0 0  Down, Depressed, Hopeless 2 0 0  PHQ - 2 Score 2 0 0  Altered sleeping 1 0 0  Tired, decreased energy 2 3 0  Change in appetite 0 0 0  Feeling bad or failure about yourself  2 0 0  Trouble concentrating 0 0 0  Moving slowly or fidgety/restless 0 0 0  Suicidal thoughts 0 0 0  PHQ-9 Score 7 3 0  Difficult doing work/chores - Not difficult at all Not difficult at all  Some recent data might be hidden     Social History   Tobacco Use  Smoking Status Former Smoker  . Packs/day: 0.25  . Years: 10.00  . Pack years: 2.50  . Types: Cigarettes  Smokeless Tobacco Never Used  Tobacco Comment   quit 20 years ago   BP Readings from Last 3 Encounters:   05/31/20 (!) 146/78  05/07/20 (!) 148/72  04/02/20 (!) 178/73   Pulse Readings from Last 3 Encounters:  05/31/20 61  05/07/20 (!) 59  04/02/20 (!) 57   Wt Readings from Last 3 Encounters:  05/31/20 214 lb (97.1 kg)  05/07/20 210 lb (95.3 kg)  03/30/20 212 lb 9.6 oz (96.4 kg)   BMI Readings from Last 3 Encounters:  05/31/20 30.71 kg/m  05/07/20 30.13 kg/m  03/30/20 34.31 kg/m    Assessment/Interventions: Review of patient past medical history, allergies, medications, health status, including review of consultants reports, laboratory and other test data, was performed as part of comprehensive evaluation and provision of chronic care management services.   SDOH:  (Social Determinants of Health) assessments and interventions performed: No  SDOH Screenings   Alcohol Screen: Not on file  Depression (PHQ2-9): Medium Risk  . PHQ-2 Score: 7  Financial Resource Strain: Medium Risk  . Difficulty of Paying Living Expenses: Somewhat hard  Food Insecurity: No Food Insecurity  . Worried About Charity fundraiser in the Last Year: Never true  . Ran Out of Food in the Last Year: Never true  Housing: Not on file  Physical Activity: Insufficiently Active  . Days of Exercise per Week: 3 days  . Minutes of Exercise per Session: 40 min  Social Connections: Not on file  Stress: No Stress Concern Present  . Feeling of Stress : Not at all  Tobacco Use: Medium Risk  . Smoking Tobacco Use: Former Smoker  . Smokeless Tobacco Use: Never Used  Transportation Needs: Unmet Transportation Needs  . Lack of Transportation (Medical): No  . Lack of Transportation (Non-Medical): Yes    CCM Care Plan  Allergies  Allergen Reactions  . Meloxicam Other (See Comments)    Fever; muscle aches; "flu-like" symptoms  . Other     Rose fever and hay fever     Medications Reviewed Today    Reviewed by Barnie Del, LCSW (Social Worker) on 06/07/20 at 1523  Med List Status: <None>  Medication Order  Taking? Sig Documenting Provider Last Dose Status Informant  amLODipine (NORVASC) 5 MG tablet [id number] No Take 1 tablet (5 mg total) by mouth daily. Glendale Chard, MD Taking Active   B Complex-C (B-COMPLEX WITH VITAMIN C) tablet [id number] No Take 1 tablet by mouth daily. [provider] Taking Active Self  Biotin 10000 MCG TABS [id number] No Take 1 tablet by mouth  daily. [provider] Taking Active Self  carvedilol (COREG CR) 20 MG 24 hr capsule [id number] No TAKE 1 CAPSULE DAILY Glendale Chard, MD Taking Active   cephALEXin (KEFLEX) 500 MG capsule [id number]  Take 1 capsule (500 mg total) by mouth 3 (three) times daily. Glendale Chard, MD  Active   Cholecalciferol 5000 units TABS [id number] No Take 5,000 Units by mouth daily. [provider] Taking Active Self           Med Note Jimmey Ralph, [facility]   Fri Aug 07, 2017  6:51 PM)    Colchicine 0.6 MG CAPS [id number] No TAKE 1 CAPSULE DAILY AS    NEEDED Glendale Chard, MD Taking Active   Cyanocobalamin (VITAMIN B-12) 2500 MCG SUBL [id number] No Take 2,500 mcg by mouth daily. [provider] Taking Active Self  diclofenac Sodium (VOLTAREN) 1 % GEL [id number] No APPLY 2 GRAMS TO AFFECTED AREA(S) 4 TIMES DAILY AS NEEDED Glendale Chard, MD Taking Active   docusate sodium (COLACE) 100 MG capsule [id number] No Take 1 capsule (100 mg total) by mouth 2 (two) times daily. [place], Aaron Edelman, MD Taking Active   famotidine (PEPCID) 20 MG tablet [id number] No Take 20 mg by mouth daily. PRN [provider] Taking Active   gabapentin (NEURONTIN) 300 MG capsule [id number] No Take 600 mg by mouth at bedtime. Pt taking 600 MG at bedtime [provider] Taking Active Self  glucose blood (ONETOUCH VERIO) test strip [id number] No Use as instructed to check blood sugars 2 times per day dx: e11.65 Glendale Chard, MD Taking Active   insulin degludec (TRESIBA FLEXTOUCH) 100 UNIT/ML SOPN FlexTouch Pen [id number] No Inject 0.09 mLs  (9 Units total) into the skin daily at 10 pm.  Patient taking differently: Inject 8 Units into the skin daily at 10 pm.   Glendale Chard, MD Taking Active   losartan (COZAAR) 100 MG tablet [id number] No Take 1 tablet (100 mg total) by mouth daily. Glendale Chard, MD Taking Active   metoprolol succinate (TOPROL-XL) 25 MG 24 hr tablet [id number]  Take 1/2 tablet by mouth daily at bedtime  for 6 days and then 1 tablet by mouth daily at bedtime Glendale Chard, MD  Active   Multiple Vitamins-Minerals (MULTIVITAMIN WITH MINERALS) tablet [id number] No Take 1 tablet by mouth daily. Women's 50+ [provider] Taking Active Self           Med Note Jimmey Ralph, [facility]   Fri Aug 07, 2017  6:53 PM)    naproxen sodium (ANAPROX) 220 MG tablet [id number] No Take 440 mg by mouth 2 (two) times daily as needed (pain). 2 tablets every night at bedtime [provider] Taking Active Self           Med Note Jimmey Ralph, Gerhard Perches   Fri Aug 07, 2017  6:53 PM)    Jonetta Speak Lancets 12Y MISC [id number] No Use as instructed to check blood sugars 2 times per day dx: e11.65 Glendale Chard, MD Taking Active   [facility], 1 MG/DOSE, 2 MG/1.5ML Bonney Aid [id number] No INJECT INTO SKIN ONCE A WEEK  Patient taking differently: 37m   SGlendale Chard MD Taking Active   simvastatin (ZOCOR) 10 MG tablet [id number] Take 1 tablet (10 mg total) by mouth daily. SGlendale Chard MD Taking Active   SYNTHROID 25 MCG tablet [id number] Take 0.5 tablet (12.5 mcg) by mouth on Mondays through Fridays, then take 1 tablet (25 mcg) by mouth on Saturdays and Sundays. SGlendale Chard MD Taking  Active           Patient Active Problem List   Diagnosis Date Noted  . Type 2 diabetes mellitus with stage 3 chronic kidney disease, with long-term current use of insulin ([place]) 11/29/2019  . Panniculitis 09/30/2019  . Back pain 09/30/2019  . Hepatoma ([place]) 01/21/2019  . Other cirrhosis of liver ([place]) 07/05/2018  . Hypertensive  nephropathy 07/05/2018  . Chronic renal disease, stage III ([place]) 07/05/2018  . Chronic left shoulder pain 07/05/2018  . Hypothyroidism 02/06/2018  . Hepatitis C virus infection cured after antiviral drug therapy 02/04/2018  . Osteoarthritis of right knee 07/30/2017  . S/P laparoscopic sleeve gastrectomy July 2018 08/26/2016  . Preop cardiovascular exam 07/30/2016  . Dyspnea on exertion 07/30/2016  . Class 2 severe obesity due to excess calories with serious comorbidity and body mass index (BMI) of 39.0 to 39.9 in adult ([place]) 07/30/2016  . Bilateral lower extremity edema 07/30/2016    Immunization History  Administered Date(s) Administered  . Fluad Quad(high Dose 65+) 11/10/2018, 11/29/2019  . Influenza, High Dose Seasonal PF 12/21/2017, 11/10/2018  . Influenza,inj,quad, With Preservative 02/25/2016  . Influenza-Unspecified 10/25/2016  . Moderna Sars-Covid-2 Vaccination 03/28/2019, 04/25/2019, 01/02/2020  . Pneumococcal Polysaccharide-23 11/02/2013  . Pneumococcal-Unspecified 11/24/2016  . Zoster Recombinat (Shingrix) 11/03/2018, 12/31/2018    Conditions to be addressed/monitored:  Hypertension and Diabetes  Care Plan : [place]  Updates made by Mayford Knife, RPH since 06/14/2020 12:00 AM    Problem: HTN, HLD, DM II   Priority: High    Long-Range Goal: Disease Management   Start Date: 04/11/2020  Recent Progress: On track  Priority: High  Note:     Current Barriers:  . Does not adhere to prescribed medication regimen   Pharmacist Clinical Goal(s):  [name] Over the next 90 days, patient will achieve adherence to monitoring guidelines and medication adherence to achieve therapeutic efficacy through collaboration with PharmD and provider.    Interventions: . 1:1 collaboration with Glendale Chard, MD regarding development and update of comprehensive plan of care as evidenced by provider attestation and co-signature . Inter-disciplinary care team collaboration  (see longitudinal plan of care) . Comprehensive medication review performed; medication list updated in electronic medical record   Hypertension (BP goal <130/80) -Uncontrolled -Current treatment: . Carvedilol CR 25 mg tablet once per day  o 27 pills left, she is unable to afford so patient to be started on different BP medication.  . Losartan 100 mg tablet daily -Medications previously tried:  -Current home readings: 140/80, 141/77, 151/72, 142/70 - checking it before she takes her medication, she uses a wrist cuff  -Current dietary habits:  -Current exercise habits:  -Denies hypotensive/hypertensive symptoms -Educated on BP goals and benefits of medications for prevention of heart attack, stroke and kidney damage; Daily salt intake goal < 2300 mg; Exercise goal of 150 minutes per week; Importance of home blood pressure monitoring; -Counseled to monitor BP at home at least once per day, document, and provide log at future appointments -Counseled on diet and exercise extensively -Patient medication is being changed from Carvedilol CR to Toprol XL.  -Instructions as follows:Toprol XL 44m nightly. She will start with 1/2 tab nightly x 1 week and come in for nurse visit within one week of starting the medication. -Patient to communicate when she starts the medication to PCP team to schedule appointment for follow up.  -Specific instructions were given by the PCP team to the patient, she voiced understanding of these instructions  and was comfortable with them.  Recommended to continue current medication   Hyperlipidemia: (LDL goal < 70) -controlled -Current treatment: . Simvastatin 20 mg taking 1 tablet daily  -Educated on Benefits of statin for ASCVD risk reduction; Importance of limiting foods high in cholesterol; Exercise goal of 150 minutes per week; -Counseled on diet and exercise extensively Recommended to continue current medication  Diabetes (A1c goal  <7%) -controlled -Current medications: . Ozempic 1 mg - once a week on Saturday  . Tresiba 100 unit/ml - inject 10 units at night  -Current home glucose readings . fasting glucose: 100-110 . post prandial glucose: none reported during this visit. -Denies hypoglycemic/hyperglycemic symptoms -Current meal patterns:  . Breakfast: toast with a tbsp of peanut butter, croissant  . lunch: salad, ravioli and soup - small portions  . dinner: salad, ravioli, and soup - small portions  . snacks: Pop-Secret 100 calories per serving . drinks: green [place] diet tea, ocean spray zero calories, propel - black cherry  -Current exercise: patient reports that she is going to resume exercising at least 2-3 times per week using a set of exercises given to her by a therapist.  She also does 10 minutes of jogging in the water and 10 minutes afterward. She is usually in the water for at least 40 minutes.  -Educated onA1c and blood sugar goals; Complications of diabetes including kidney damage, retinal damage, and cardiovascular disease; Benefits of weight loss; Carbohydrate counting and/or plate method -Counseled to check feet daily and get yearly eye exams -Counseled on the importance of checking blood sugar twice per day.  Recommended patient closely monitor the amount of carbs that she has with each serving.  Educated on the importance of drinking water and will be giving her a Nutrition in the fast lane hand out.    Patient Goals/Self-Care Activities . Over the next 90 days, patient will:  - take medications as prescribed target a minimum of 150 minutes of moderate intensity exercise weekly  Follow Up Plan: Telephone follow up appointment with care management team member scheduled for: 07/05/2020  The patient has been provided with contact information for the care management team and has been advised to call with any health related questions or concerns.       Medication Assistance: Ozempicobtained  through [employer] medication assistance program.  Enrollment ends 01/2021.   Patient's preferred pharmacy is:  [facility], [address] to [address] [place] [address] Phone: [phone] Fax: [place], [address]. [address]. [place] [address] Phone: [phone] Fax: [phone]  Uses pill box? Yes Pt endorses 90% compliance  We discussed: Benefits of medication synchronization, packaging and delivery as well as enhanced pharmacist oversight with [employer]. Patient decided to: Continue current medication management strategy  Care Plan and Follow Up Patient Decision:  Patient agrees to Care Plan and Follow-up.  Plan: The patient has been provided with contact information for the care management team and has been advised to call with any health related questions or concerns.   [name], PharmD Clinical Pharmacist [facility] [phone]

## 2020-06-06 ENCOUNTER — Telehealth: Payer: Self-pay

## 2020-06-06 ENCOUNTER — Other Ambulatory Visit: Payer: Self-pay

## 2020-06-06 MED ORDER — CEPHALEXIN 500 MG PO CAPS: 500.0000 mg | ORAL_CAPSULE | Freq: Three times a day (TID) | ORAL | 0 refills | Status: DC

## 2020-06-06 NOTE — Telephone Encounter (Signed)
  Chronic Care Management   Outreach Note  06/06/2020 Name: Teresa Golden MRN: [id number] DOB: Dec 28, 1945  Referred by: Glendale Chard, MD Reason for referral : Chronic Care Management   Inbound call received from the patient who indicates she notes on her MyChart she has a UTI. Patient wanted to make sure the antibiotic got sent to the [facility] on [address]. Advised the patient SW would communicate request to her primary providers office.  Follow Up Plan: Collaboration with Michelle Nasuti, CMA.   Daneen Schick, BSW, CDP Social Worker, Certified Dementia Practitioner [place] [facility] [facility] [phone]

## 2020-06-07 ENCOUNTER — Telehealth: Payer: Self-pay

## 2020-06-07 ENCOUNTER — Other Ambulatory Visit: Payer: Self-pay

## 2020-06-07 ENCOUNTER — Ambulatory Visit (INDEPENDENT_AMBULATORY_CARE_PROVIDER_SITE_OTHER): Payer: Medicare HMO | Admitting: Addiction (Substance Use Disorder)

## 2020-06-07 DIAGNOSIS — F4323 Adjustment disorder with mixed anxiety and depressed mood: Secondary | ICD-10-CM

## 2020-06-07 MED ORDER — METOPROLOL SUCCINATE ER 25 MG PO TB24: ORAL_TABLET | ORAL | 1 refills | Status: DC

## 2020-06-07 NOTE — Telephone Encounter (Signed)
Patient notified and medication sent.

## 2020-06-07 NOTE — Telephone Encounter (Signed)
-----   Message from Glendale Chard, MD sent at 06/06/2020  5:04 PM EDT ----- Please send rx Toprol XL 25mg , take 1/2 tab po qpm x 6 days, then one tablet daily. (this will take the place of her Coreg).  This is generic   Let pt know she has to let us know when she starts the med so one week nurse visit csn be scheduled.

## 2020-06-07 NOTE — Progress Notes (Signed)
      [facility] Counselor/Therapist Progress Note  [name], MRN: [id number],    Date: 06/07/2020   Time Spent:  54mins  Treatment Type: Individual Therapy  Reported Symptoms:  Worried   Mental Status Exam:  Appearance:   Casual     Behavior:  Appropriate and Sharing  Motor:  Normal  Speech/Language:   Clear and Coherent  Affect:  Appropriate  Mood:  anxious  Thought process:  normal  Thought content:    Rumination  Sensory/Perceptual disturbances:    WNL  Orientation:  x4  Attention:  Good  Concentration:  Good  Memory:  WNL  Fund of knowledge:   Good  Insight:    Good  Judgment:   Good  Impulse Control:  Good   Risk Assessment: Danger to Self:  No Self-injurious Behavior: No Danger to Others: No Duty to Warn:no Physical Aggression / Violence:No  Access to Firearms a concern: No  Gang Involvement:No   Subjective: Client reported struggling with not being able to control a lot in her life right now, including if she can get her surgery that shes been planning for a year. Client discussed some heart issues shes worried about and therapist used MI and CBT to support client and help her find ways to rationalize things and not let her mind go wild. Client processed feeling a desire for companionship while also not trusting a lot of guys due to personal experiences. Client expressed her desires for a husband and her goals for herself before she starts really looking: getting her surgery, going through school, etc. Therapist assessed for stability and client denied SI/HI/AVH.   Interventions: Motivational Interviewing and RPT   Diagnosis:   ICD-10-CM   1. Adjustment disorder with mixed anxiety and depressed mood  F43.23     Plan of Care:  Client is to return to therapy with therapist every 1-2 weeks as needed to process traumas/ frustrations in a safe space, to be re-evaluated in 3 months.  Client is to practice mindfulness AEB daily meditation and body  scans or as needed when flooded by emotion/pain.  Client is to learn/practice DBT wise mind & radical acceptance. Client is to practice self-compassion AEB being gentle with themselves, utilizing self-care techniques daily or as needed when grieving something in the moment.  Client is to process grief/pain of their loss in a somatic body-felt sense way: ie using mindfulness, brainspotting, or trauma release as a method for releasing body pain/tension caused by grief.  Barnie Del, LCSW, LCAS, CCTP, CCS-I, BSP

## 2020-06-11 ENCOUNTER — Telehealth: Payer: Self-pay

## 2020-06-11 ENCOUNTER — Telehealth: Payer: Medicare HMO

## 2020-06-11 ENCOUNTER — Ambulatory Visit: Payer: Self-pay

## 2020-06-11 DIAGNOSIS — I129 Hypertensive chronic kidney disease with stage 1 through stage 4 chronic kidney disease, or unspecified chronic kidney disease: Secondary | ICD-10-CM

## 2020-06-11 DIAGNOSIS — Z794 Long term (current) use of insulin: Secondary | ICD-10-CM

## 2020-06-11 DIAGNOSIS — N183 Chronic kidney disease, stage 3 unspecified: Secondary | ICD-10-CM | POA: Diagnosis not present

## 2020-06-11 DIAGNOSIS — M793 Panniculitis, unspecified: Secondary | ICD-10-CM

## 2020-06-11 DIAGNOSIS — E1122 Type 2 diabetes mellitus with diabetic chronic kidney disease: Secondary | ICD-10-CM

## 2020-06-11 DIAGNOSIS — Z6836 Body mass index (BMI) 36.0-36.9, adult: Secondary | ICD-10-CM

## 2020-06-11 DIAGNOSIS — N1831 Chronic kidney disease, stage 3a: Secondary | ICD-10-CM

## 2020-06-11 DIAGNOSIS — F419 Anxiety disorder, unspecified: Secondary | ICD-10-CM

## 2020-06-11 NOTE — Telephone Encounter (Signed)
  Chronic Care Management   Outreach Note  06/11/2020 Name: Teresa Golden MRN: [id number] DOB: 03/26/1945  Referred by: Glendale Chard, MD Reason for referral : Chronic Care Management (RN CM follow up call attempt )   An unsuccessful telephone outreach was attempted today. The patient was referred to the case management team for assistance with care management and care coordination.   Follow Up Plan: Telephone follow up appointment with care management team member scheduled for: 06/29/20  Barb Merino, RN, BSN, CCM Care Management Coordinator [facility]  Direct Phone: [phone]

## 2020-06-12 ENCOUNTER — Encounter: Payer: Medicare HMO | Admitting: Surgical

## 2020-06-13 NOTE — Patient Instructions (Signed)
Goals Addressed    . Complete Panniculectomy   On track    Timeframe:  Long-Range Goal Priority:  Medium Start Date: 02/29/20                            Expected End Date: 08/28/20   Next Follow up date: 07/27/20  Patient Self-Care Activities:  Keep the following appointments related to pre-op and post-op Panniculectomy 07/03/20 Pre-Op visit for Panniculectomy procedure (Dr. Marla Roe) 07/27/20 Lab visit for Panniculectomy procedure 07/30/20 Panniculectomy outpatient/inpatient procedure  08/07/20 Post-Op Panniculectomy follow up appointment with Dr. Marla Roe  08/14/20 & 08/28/20 Post-Op Panniculectomy follow up appointments with Roetta Sessions, PA-C                      . COMPLETED: COVID 19 Infection - Complications minimized or prevented       Timeframe:  Short-Term Goal Priority:  High Start Date:  04/03/20                           Expected End Date:  05/01/20  Follow Up Date: 05/01/20  Keep all scheduled follow up appointments for COVID recheck as directed Stay well hydrated and take all prescribed medications as directed Notify PCP provider for new or worsening symptoms related to COVID 19 Seek medication attention right away for unresolved chest pain or shortness of breath                . Lifestyle Change-Hypertension   On track    Timeframe:  Long-Range Goal Priority:  High Start Date: 02/01/20                            Expected End Date: 08/01/20                     Follow Up Date: 07/27/20    - agree to work together to make changes - ask questions to understand - learn about high blood pressure    Why is this important?    The changes that you are asked to make may be hard to do.   This is especially true when the changes are life-long.   Knowing why it is important to you is the first step.   Working on the change with your family or support person helps you not feel alone.   Reward yourself and family or support person when goals are met. This can be an activity you  choose like bowling, hiking, biking, swimming or shooting hoops.     Notes:     . Track and Manage My Blood Pressure-Hypertension   On track    Timeframe:  Long-Range Goal Priority:  High Start Date: 02/01/20                            Expected End Date: 08/01/20                   Follow Up Date: 07/27/20   - check blood pressure 3 times per week - write blood pressure results in a log or diary    Why is this important?    You won't feel high blood pressure, but it can still hurt your blood vessels.   High blood pressure can cause heart or kidney problems. It can also cause  a stroke.   Making lifestyle changes like losing a Melchizedek Espinola weight or eating less salt will help.   Checking your blood pressure at home and at different times of the day can help to control blood pressure.   If the doctor prescribes medicine remember to take it the way the doctor ordered.   Call the office if you cannot afford the medicine or if there are questions about it.     Notes:

## 2020-06-13 NOTE — Chronic Care Management (AMB) (Signed)
Chronic Care Management   CCM RN Visit Note  06/11/2020 Name: Teresa Golden MRN: [id number] DOB: [birth year]  Subjective: Teresa Golden is a 75 y.o. year old female who is a primary care patient of Glendale Chard, MD. The care management team was consulted for assistance with disease management and care coordination needs.    Engaged with patient by telephone for follow up visit in response to provider referral for case management and/or care coordination services.   Consent to Services:  The patient was given information about Chronic Care Management services, agreed to services, and gave verbal consent prior to initiation of services.  Please see initial visit note for detailed documentation.   Patient agreed to services and verbal consent obtained.   Assessment: Review of patient past medical history, allergies, medications, health status, including review of consultants reports, laboratory and other test data, was performed as part of comprehensive evaluation and provision of chronic care management services.   SDOH (Social Determinants of Health) assessments and interventions performed:  Yes, no acute needs identified  CCM Care Plan  Allergies  Allergen Reactions  . Meloxicam Other (See Comments)    Fever; muscle aches; "flu-like" symptoms  . Other     Rose fever and hay fever     Outpatient Encounter Medications as of 06/11/2020  Medication Sig  . amLODipine (NORVASC) 5 MG tablet Take 1 tablet (5 mg total) by mouth daily.  . B Complex-C (B-COMPLEX WITH VITAMIN C) tablet Take 1 tablet by mouth daily.  . Biotin 10000 MCG TABS Take 1 tablet by mouth daily.  . carvedilol (COREG CR) 20 MG 24 hr capsule TAKE 1 CAPSULE DAILY  . cephALEXin (KEFLEX) 500 MG capsule Take 1 capsule (500 mg total) by mouth 3 (three) times daily.  . Cholecalciferol 5000 units TABS Take 5,000 Units by mouth daily.  . Colchicine 0.6 MG CAPS TAKE 1 CAPSULE DAILY AS    NEEDED  . Cyanocobalamin (VITAMIN B-12)  2500 MCG SUBL Take 2,500 mcg by mouth daily.  . diclofenac Sodium (VOLTAREN) 1 % GEL APPLY 2 GRAMS TO AFFECTED AREA(S) 4 TIMES DAILY AS NEEDED  . docusate sodium (COLACE) 100 MG capsule Take 1 capsule (100 mg total) by mouth 2 (two) times daily.  . famotidine (PEPCID) 20 MG tablet Take 20 mg by mouth daily. PRN  . gabapentin (NEURONTIN) 300 MG capsule Take 600 mg by mouth at bedtime. Pt taking 600 MG at bedtime  . glucose blood (ONETOUCH VERIO) test strip Use as instructed to check blood sugars 2 times per day dx: e11.65  . insulin degludec (TRESIBA FLEXTOUCH) 100 UNIT/ML SOPN FlexTouch Pen Inject 0.09 mLs (9 Units total) into the skin daily at 10 pm. (Patient taking differently: Inject 8 Units into the skin daily at 10 pm.)  . losartan (COZAAR) 100 MG tablet Take 1 tablet (100 mg total) by mouth daily.  . metoprolol succinate (TOPROL-XL) 25 MG 24 hr tablet Take 1/2 tablet by mouth daily at bedtime  for 6 days and then 1 tablet by mouth daily at bedtime  . Multiple Vitamins-Minerals (MULTIVITAMIN WITH MINERALS) tablet Take 1 tablet by mouth daily. Women's 50+  . naproxen sodium (ANAPROX) 220 MG tablet Take 440 mg by mouth 2 (two) times daily as needed (pain). 2 tablets every night at bedtime  . OneTouch Delica Lancets 51Z MISC Use as instructed to check blood sugars 2 times per day dx: e11.65  . OZEMPIC, 1 MG/DOSE, 2 MG/1.5ML SOPN INJECT INTO SKIN ONCE A  WEEK (Patient taking differently: 1mg )  . simvastatin (ZOCOR) 10 MG tablet Take 1 tablet (10 mg total) by mouth daily.  [name] SYNTHROID 25 MCG tablet Take 0.5 tablet (12.5 mcg) by mouth on Mondays through Fridays, then take 1 tablet (25 mcg) by mouth on Saturdays and Sundays.   No facility-administered encounter medications on file as of 06/11/2020.    Patient Active Problem List   Diagnosis Date Noted  . Type 2 diabetes mellitus with stage 3 chronic kidney disease, with long-term current use of insulin ([place]) 11/29/2019  . Panniculitis 09/30/2019   . Back pain 09/30/2019  . Hepatoma ([place]) 01/21/2019  . Other cirrhosis of liver ([place]) 07/05/2018  . Hypertensive nephropathy 07/05/2018  . Chronic renal disease, stage III ([place]) 07/05/2018  . Chronic left shoulder pain 07/05/2018  . Hypothyroidism 02/06/2018  . Hepatitis C virus infection cured after antiviral drug therapy 02/04/2018  . Osteoarthritis of right knee 07/30/2017  . S/P laparoscopic sleeve gastrectomy July 2018 08/26/2016  . Preop cardiovascular exam 07/30/2016  . Dyspnea on exertion 07/30/2016  . Class 2 severe obesity due to excess calories with serious comorbidity and body mass index (BMI) of 39.0 to 39.9 in adult ([place]) 07/30/2016  . Bilateral lower extremity edema 07/30/2016    Conditions to be addressed/monitored:DM, CKD III, Hypertensive Nephropathy, Class 2 severe obesity, Anxiety, Panniculitis   Care Plan : Diabetes Type 2 (Adult)  Updates made by Lynne Logan, RN since 06/13/2020 12:00 AM    Problem: Glycemic Management (Diabetes, Type 2)   Priority: High    Long-Range Goal: Glycemic Management Optimized   Start Date: 02/01/2020  Expected End Date: 08/01/2020  Recent Progress: On track  Priority: High  Note:   Objective:  Lab Results  Component Value Date   HGBA1C 6.7 (H) 11/29/2019 .   Lab Results  Component Value Date   CREATININE 0.96 11/29/2019   CREATININE 0.83 08/18/2019   CREATININE 1.09 (H) 04/19/2019 .   [name] No results found for: EGFR Current Barriers:  [name] Knowledge Deficits related to basic Diabetes pathophysiology and self care/management Case Manager Clinical Goal(s):  [name] Over the next 180 days, patient will demonstrate improved adherence to prescribed treatment plan for diabetes self care/management as evidenced by:  . daily monitoring and recording of CBG  . adherence to ADA/ carb modified diet . exercise 3-5 days/week . adherence to prescribed medication regimen Interventions:  . Reviewed medications with patient and discussed  importance of medication adherence . Discussed plans with patient for ongoing care management follow up and provided patient with direct contact information for care management team . Advised patient, providing education and rationale, to check cbg daily before meals and record, calling the CCM team and or PCP for findings outside established parameters.   . Review of patient status, including review of consultants reports, relevant laboratory and other test results, and medications completed.  Anticipate A1C testing (point-of-care) every 3 to 6 months based on goal attainment.   Review mutually-set A1C goal or target range.   Anticipate use of antihyperglycemic with or without insulin and periodic adjustments; consider active involvement of pharmacist.   Compare self-reported symptoms of hypo or hyperglycemia to blood glucose levels, diet and fluid intake, current medications, psychosocial and physiologic stressors, change in activity and barriers to care adherence.  Patient Goals/Self-Care Activities . Over the next 180 days, patient will:  - Self administers oral medications as prescribed Self administers insulin as prescribed Self administers injectable DM medication (Ozempic, [name]) as prescribed  Attends all scheduled provider appointments Checks blood sugars as prescribed and utilize hyper and hypoglycemia protocol as needed Adheres to prescribed ADA/carb modified - Perform daily foot checks - Schedule eye and dental exams - enter blood sugar readings and medication or insulin into daily log - take the blood sugar log to all doctor visits - take the blood sugar meter to all doctor visits    Follow Up Plan: Telephone follow up appointment with care management team member scheduled for: 07/27/20   Problem: Disease Progression (Diabetes, Type 2)   Priority: High    Long-Range Goal: Disease Progression Prevented or Minimized   Start Date: 02/01/2020  Expected End Date: 08/01/2020   Recent Progress: On track  Priority: High  Note:   Objective:  Lab Results  Component Value Date   HGBA1C 6.7 (H) 11/29/2019 .   Lab Results  Component Value Date   CREATININE 0.96 11/29/2019   CREATININE 0.83 08/18/2019   CREATININE 1.09 (H) 04/19/2019 .   [name] No results found for: EGFR Current Barriers:  [name] Knowledge Deficits related to basic Diabetes pathophysiology and self care/management Case Manager Clinical Goal(s):  [name] Over the next 180 days, patient will demonstrate improved adherence to prescribed treatment plan for diabetes self care/management as evidenced by:  . daily monitoring and recording of CBG  . adherence to ADA/ carb modified diet . exercise 3-5 days/week . adherence to prescribed medication regimen Interventions:   Ensure completion of annual comprehensive foot exam and dilated eye exam.    Implement additional individualized goals and interventions based on identified risk factors.   Encourage lifestyle changes, such as increased intake of plant-based foods, stress reduction, consistent physical activity and smoking cessation to prevent long-term complications and chronic disease.    Individualize activity and exercise recommendations while considering potential limitations, such as neuropathy, retinopathy or the ability to prevent hyperglycemia or hypoglycemia.   . Provided education to patient about basic DM disease process . Discussed plans with patient for ongoing care management follow up and provided patient with direct contact information for care management team . Advised patient, providing education and rationale, to check cbg 1-2 times daily before meals and record, calling the CCM team and or PCP for findings outside established parameters.   Patient Goals/Self-Care Activities . Over the next 180 days, patient will:  - Self administers oral medications as prescribed Self administers insulin as prescribed Self administers injectable DM medication  (Ozempic, [name] ) as prescribed Attends all scheduled provider appointments Checks blood sugars as prescribed and utilize hyper and hypoglycemia protocol as needed Adheres to prescribed ADA/carb modified - set target A1C Follow Up Plan: Telephone follow up appointment with care management team member scheduled for: 07/27/20   Care Plan : Hypertension (Adult)  Updates made by Lynne Logan, RN since 06/13/2020 12:00 AM    Problem: Hypertension (Hypertension)   Priority: High    Long-Range Goal: Hypertension Monitored   Start Date: 02/01/2020  Expected End Date: 08/01/2020  Recent Progress: On track  Priority: High  Note:   Objective:  . Last practice recorded BP readings:  BP Readings from Last 3 Encounters:  01/06/20 (!) 149/69  11/29/19 130/68  11/29/19 130/68 .   [name] Most recent eGFR/CrCl: No results found for: EGFR  No components found for: CRCL Current Barriers:  [name] Knowledge Deficits related to basic understanding of hypertension pathophysiology and self care management Case Manager Clinical Goal(s):  [name] Over the next 180 days, patient will verbalize understanding of plan  for hypertension management Interventions:  06/11/20 completed successful outbound call to patient  . Evaluation of current treatment plan related to hypertension self management and patient's adherence to plan as established by provider. . Reviewed blood pressure measurements taken inside and outside of the provider office; establish baseline and monitor trends; compare to target ranges or patient goal.  . Provided education to patient re: stroke prevention, s/s of heart attack and stroke, DASH diet, complications of uncontrolled blood pressure . Reviewed medications with patient and discussed importance of compliance; reviewed BP medication change per Dr. Baird Cancer including indication/dosage and frequency and planned start date  Encourage continued use of home blood pressure monitoring and recording in blood  pressure log; include symptoms of hypotension or potential medication side effects in log.   Discussed plans with patient for ongoing care management follow up and provided patient with direct contact information for care management team Patient Goals/Self-Care Activities . Over the next 180 days, patient will:  - Self administers medications as prescribed Attends all scheduled provider appointments Calls provider office for new concerns, questions, or BP outside discussed parameters Checks BP and records as discussed Follows a low sodium diet/DASH diet - check blood pressure 3 times per week - write blood pressure results in a log or diary  Follow Up Plan: Telephone follow up appointment with care management team member scheduled for: 07/27/20   Problem: Disease Progression (Hypertension)   Priority: High    Goal: Disease Progression Prevented or Minimized   Start Date: 02/01/2020  Expected End Date: 08/01/2020  Recent Progress: On track  Priority: High  Note:   Objective:  . Last practice recorded BP readings:  BP Readings from Last 3 Encounters:  01/06/20 (!) 149/69  11/29/19 130/68  11/29/19 130/68 .   [name] Most recent eGFR/CrCl: No results found for: EGFR  No components found for: CRCL Current Barriers:  [name] Knowledge Deficits related to basic understanding of hypertension pathophysiology and self care management Case Manager Clinical Goal(s):  [name] Over the next 180 days, patient will demonstrate improved adherence to prescribed treatment plan for hypertension as evidenced by taking all medications as prescribed, monitoring and recording blood pressure as directed, adhering to low sodium/DASH diet Interventions:  . Evaluation of current treatment plan related to hypertension self management and patient's adherence to plan as established by provider. . Provided education to patient re: stroke prevention, s/s of heart attack and stroke, DASH diet, complications of uncontrolled blood  pressure . Reviewed medications with patient and discussed importance of compliance . Discussed plans with patient for ongoing care management follow up and provided patient with direct contact information for care management team . Advised patient, providing education and rationale, to monitor blood pressure daily and record, calling PCP for findings outside established parameters.   Promote a healthy diet that includes primarily plant-based foods, such as fruits, vegetables, whole grains, beans and legumes, low-fat dairy and lean meats.    Consider moderate reduction in sodium intake by avoiding the addition of salt to prepared foods and limiting processed meats, canned soup, frozen meals and salty snacks.    Promote a regular, daily exercise goal of 150 minutes per week of moderate exercise based on tolerance, ability and patient choice; consider referral to physical therapist, community wellness and/or activity program.   Review sources of stress; explore current coping strategies and encourage use of mindfulness, yoga, meditation or exercise to manage stress.   Patient Goals/Self-Care Activities . Over the next 180 days, patient will:  -  Self administers medications as prescribed Attends all scheduled provider appointments Calls provider office for new concerns, questions, or BP outside discussed parameters Checks BP and records as discussed Follows a low sodium diet/DASH diet - agree to work together to make changes - ask questions to understand - learn about high blood pressure Follow Up Plan: Telephone follow up appointment with care management team member scheduled for: 07/27/20   Care Plan : General Plan of Care (Adult)  Updates made by Lynne Logan, RN since 06/13/2020 12:00 AM    Problem: Quality of Life (General Plan of Care)     Long-Range Goal: Quality of Life Maintained   Start Date: 02/01/2020  Expected End Date: 08/01/2020  Recent Progress: On track  Priority: High   Note:   Current Barriers:   Ineffective Self Health Maintenance  Currently UNABLE TO independently self manage needs related to chronic health conditions.   Knowledge Deficits related to short term plan for care coordination needs and long term plans for chronic disease management needs Nurse Case Manager Clinical Goal(s):   Over the next 180 days, patient will work with care management team to address care coordination and chronic disease management needs related to Disease Management  Educational Needs  Care Coordination  Medication Management and Education  Psychosocial Support   Interventions:  03/27/20 inbound call completed with patient   Answered questions related to effective health maintenance  Provided active listening to patient and validated her concerns related to routine HIV screening for effective health maintenance and to ensure quality of life is optimal   Determined patient will discuss with PCP at next scheduled visit, recommendations for HIV testing  Patient Goals/Self Care/Activities:  Over the next 180 days, patient will  - discuss my treatment options with the doctor or nurse - learn something new by asking, reading and searching the Internet every day - make shared treatment decisions with doctor  - increase water intake to 64oz-80oz per day - complete full course of antibiotics as directed  - contact PCP for reoccurring symptoms   Follow Up Plan: Telephone follow up appointment with care management team member scheduled for: 07/27/20   Problem: COVID 19 Infection Resolved 06/11/2020  Priority: High    Goal: COVID 19 Infection - Complications minimized or prevented Completed 06/11/2020  Start Date: 04/03/2020  Expected End Date: 05/01/2020  Recent Progress: On track  Priority: High  Note:   Current Barriers:   Ineffective Self Health Maintenance  Difficulty obtaining transportation while COVID positive  Currently UNABLE TO independently self manage  needs related to chronic health conditions.   Knowledge Deficits related to short term plan for care coordination needs and long term plans for chronic disease management needs Clinical Goal(s):  [name] Collaboration with Glendale Chard, MD regarding development and update of comprehensive plan of care as evidenced by provider attestation and co-signature . Inter-disciplinary care team collaboration (see longitudinal plan of care)  Over the next 90 days, patient will work with care management team to address care coordination and chronic disease management needs related to Other COVID 19 Infection     Interventions:  06/11/20 Successful call completed with patient   Evaluation of current treatment plan related to COVID-19 infection self-management and patient's adherence to plan as established by provider.  Collaboration with Glendale Chard, MD regarding development and update of comprehensive plan of care as evidenced by provider attestation       and co-signature  Inter-disciplinary care team collaboration (see longitudinal plan of care)  Determined patient received a monoclonial antibody infusion through [facility] on 04/02/20 w/o noted adverse reaction   Determined patient feels she has made a full recovery from COVID 19 and denies having long haul symptoms  . Discussed plans with patient for ongoing care management follow up and provided patient with direct contact information for care management team Patient Goals/Self Care Activities:  Over the next 30 days, patient will:  Keep all scheduled follow up appointments for COVID recheck as directed Stay well hydrated and take all prescribed medications as directed Notify PCP provider for new or worsening symptoms related to COVID 19 Seek medication attention right away for unresolved chest pain or shortness of breath    Care Plan : Elective Surgery  Updates made by Lynne Logan, RN since 06/13/2020 12:00 AM    Problem: Panniculectomy  Procedure   Priority: Medium    Long-Range Goal: Manage stretched out, excess fat and overhanging skin from your abdomen   Start Date: 02/29/2020  Expected End Date: 08/28/2020  Recent Progress: On track  Priority: Medium  Note:   Current Barriers:   Ineffective Self Health Maintenance  Currently UNABLE TO independently self manage needs related to chronic health conditions.   Knowledge Deficits related to short term plan for care coordination needs and long term plans for chronic disease management needs Nurse Case Manager Clinical Goal(s):   Over the next 180 days, patient will work with care management team to address care coordination and chronic disease management needs related to Disease Management  Educational Needs  Care Coordination  Medication Management and Education  Psychosocial Support   Interventions:  06/11/20 completed outbound successful call with patient   Determined patient will undergo a Panniculectomy procedure by Dr. Marla Roe on 07/30/20  Determined this procedure is scheduled for outpatient but patient may need inpatient services pending hemodynamic stability following the procedure  Determined and discussed the following appointments related to this procedure:  07/03/20 Pre-Op visit for Panniculectomy procedure (Dr. Marla Roe) 07/27/20 Lab visit for Panniculectomy procedure 07/30/20 Panniculectomy outpatient/inpatient procedure  08/07/20 Post-Op Panniculectomy follow up appointment with Dr. Marla Roe  08/14/20 & 08/28/20 Post-Op Panniculectomy follow up appointments with Roetta Sessions, PA-C   . Determined patient has a neighbor who has medical background and will be available to check on her following her procedure, he will help with bathing and meal prep . Discussed plans with patient for ongoing care management follow up and provided patient with direct contact information for care management team Over the next 180 days, patient will;  - complete the  following scheduled appointments:  07/03/20 Pre-Op visit for Panniculectomy procedure (Dr. Audelia Hives, DO) 07/27/20 Lab visit for Panniculectomy procedure 07/30/20 Panniculectomy outpatient/inpatient procedure  08/07/20 Post-Op Panniculectomy follow up appointment with Dr. Marla Roe  08/14/20 & 08/28/20 Post-Op Panniculectomy follow up appointments with Roetta Sessions, PA-C              Follow Up Plan: Telephone follow up appointment with care management team member scheduled for:  07/27/20    Plan:Telephone follow up appointment with care management team member scheduled for:  07/27/20  Barb Merino, RN, BSN, CCM Care Management Coordinator [place] [facility]  Direct Phone: [phone]

## 2020-06-14 NOTE — Patient Instructions (Signed)
Visit Information It was great speaking with you today!  Please let me know if you have any questions about our visit.  Care Plan : [place]  Updates made by Mayford Knife, RPH since 06/14/2020 12:00 AM    Problem: HTN, HLD, DM II   Priority: High    Long-Range Goal: Disease Management   Start Date: 04/11/2020  Recent Progress: On track  Priority: High  Note:     Current Barriers:  . Does not adhere to prescribed medication regimen   Pharmacist Clinical Goal(s):  [name] Over the next 90 days, patient will achieve adherence to monitoring guidelines and medication adherence to achieve therapeutic efficacy through collaboration with PharmD and provider.    Interventions: . 1:1 collaboration with Glendale Chard, MD regarding development and update of comprehensive plan of care as evidenced by provider attestation and co-signature . Inter-disciplinary care team collaboration (see longitudinal plan of care) . Comprehensive medication review performed; medication list updated in electronic medical record   Hypertension (BP goal <130/80) -Uncontrolled -Current treatment: . Carvedilol CR 25 mg tablet once per day  o 27 pills left, she is unable to afford so patient to be started on different BP medication.  . Losartan 100 mg tablet daily -Medications previously tried:  -Current home readings: 140/80, 141/77, 151/72, 142/70 - checking it before she takes her medication, she uses a wrist cuff  -Current dietary habits:  -Current exercise habits:  -Denies hypotensive/hypertensive symptoms -Educated on BP goals and benefits of medications for prevention of heart attack, stroke and kidney damage; Daily salt intake goal < 2300 mg; Exercise goal of 150 minutes per week; Importance of home blood pressure monitoring; -Counseled to monitor BP at home at least once per day, document, and provide log at future appointments -Counseled on diet and exercise extensively -Patient  medication is being changed from Carvedilol CR to Toprol XL.  -Instructions as follows:Toprol XL 25mg  nightly. She will start with 1/2 tab nightly x 1 week and come in for nurse visit within one week of starting the medication. -Patient to communicate when she starts the medication to PCP team to schedule appointment for follow up.  -Specific instructions were given by the PCP team to the patient, she voiced understanding of these instructions and was comfortable with them.  Recommended to continue current medication   Hyperlipidemia: (LDL goal < 70) -controlled -Current treatment: . Simvastatin 20 mg taking 1 tablet daily  -Educated on Benefits of statin for ASCVD risk reduction; Importance of limiting foods high in cholesterol; Exercise goal of 150 minutes per week; -Counseled on diet and exercise extensively Recommended to continue current medication  Diabetes (A1c goal <7%) -controlled -Current medications: . Ozempic 1 mg - once a week on Saturday  . Tresiba 100 unit/ml - inject 10 units at night  -Current home glucose readings . fasting glucose: 100-110 . post prandial glucose: none reported during this visit. -Denies hypoglycemic/hyperglycemic symptoms -Current meal patterns:  . Breakfast: toast with a tbsp of peanut butter, croissant  . lunch: salad, ravioli and soup - small portions  . dinner: salad, ravioli, and soup - small portions  . snacks: Pop-Secret 100 calories per serving . drinks: green [place] diet tea, ocean spray zero calories, propel - black cherry  -Current exercise: patient reports that she is going to resume exercising at least 2-3 times per week using a set of exercises given to her by a therapist.  She also does 10 minutes of jogging in the water  and 10 minutes afterward. She is usually in the water for at least 40 minutes.  -Educated onA1c and blood sugar goals; Complications of diabetes including kidney damage, retinal damage, and cardiovascular  disease; Benefits of weight loss; Carbohydrate counting and/or plate method -Counseled to check feet daily and get yearly eye exams -Counseled on the importance of checking blood sugar twice per day.  Recommended patient closely monitor the amount of carbs that she has with each serving.  Educated on the importance of drinking water and will be giving her a Nutrition in the fast lane hand out.    Patient Goals/Self-Care Activities . Over the next 90 days, patient will:  - take medications as prescribed target a minimum of 150 minutes of moderate intensity exercise weekly  Follow Up Plan: Telephone follow up appointment with care management team member scheduled for: 07/05/2020  The patient has been provided with contact information for the care management team and has been advised to call with any health related questions or concerns.        Patient agreed to services and verbal consent obtained.   The patient verbalized understanding of instructions, educational materials, and care plan provided today and agreed to receive a mailed copy of patient instructions, educational materials, and care plan.   [name], PharmD Clinical Pharmacist [facility] [phone]

## 2020-06-18 DIAGNOSIS — I10 Essential (primary) hypertension: Secondary | ICD-10-CM | POA: Insufficient documentation

## 2020-06-18 DIAGNOSIS — D1803 Hemangioma of intra-abdominal structures: Secondary | ICD-10-CM | POA: Insufficient documentation

## 2020-06-20 ENCOUNTER — Encounter: Payer: Self-pay | Admitting: Internal Medicine

## 2020-06-20 ENCOUNTER — Telehealth (INDEPENDENT_AMBULATORY_CARE_PROVIDER_SITE_OTHER): Payer: Medicare HMO | Admitting: Internal Medicine

## 2020-06-20 VITALS — BP 147/74

## 2020-06-20 DIAGNOSIS — Z01810 Encounter for preprocedural cardiovascular examination: Secondary | ICD-10-CM | POA: Diagnosis not present

## 2020-06-20 DIAGNOSIS — I10 Essential (primary) hypertension: Secondary | ICD-10-CM | POA: Diagnosis not present

## 2020-06-20 DIAGNOSIS — U071 COVID-19: Secondary | ICD-10-CM

## 2020-06-20 DIAGNOSIS — N183 Chronic kidney disease, stage 3 unspecified: Secondary | ICD-10-CM

## 2020-06-20 DIAGNOSIS — Z794 Long term (current) use of insulin: Secondary | ICD-10-CM

## 2020-06-20 DIAGNOSIS — R9431 Abnormal electrocardiogram [ECG] [EKG]: Secondary | ICD-10-CM | POA: Diagnosis not present

## 2020-06-20 DIAGNOSIS — E1122 Type 2 diabetes mellitus with diabetic chronic kidney disease: Secondary | ICD-10-CM

## 2020-06-20 NOTE — Patient Instructions (Signed)

## 2020-06-20 NOTE — Progress Notes (Signed)
Virtual Visit via Video Note   This visit type was conducted due to national recommendations for restrictions regarding the COVID-19 Pandemic (e.g. social distancing) in an effort to limit this patient's exposure and mitigate transmission in our community.  Due to her co-morbid illnesses, this patient is at least at moderate risk for complications without adequate follow up.  This format is felt to be most appropriate for this patient at this time.  All issues noted in this document were discussed and addressed.  A limited physical exam was performed with this format.  Please refer to the patient's chart for her consent to telehealth for [facility].  Date:  06/20/2020   ID:  Teresa Golden, DOB 05/16/1945, MRN [id number] The patient was identified using 2 identifiers.  Patient Location: Home Provider Location: Office/Clinic  PCP:  Glendale Chard, [place]  Cardiologist:  Elouise Munroe, MD  Advanced Practice Provider:  No care team member to display Electrophysiologist:  None      Evaluation Performed:  Follow-Up Visit  Chief Complaint:  Abnormal EKG  History of Present Illness:    Teresa Golden is a 75 y.o. female with DM2, CKD, HTN, GERD, Hepatitis C treated with Harvoni, Hypothyroidism, who presents for evaluation of abnormal EKG prior to pannectomy 07/30/20.   Transitioning from carvedilol to metoprolol succinate because of insurance coverage, will be more affordable. This is acceptable from cardiovascular standpoint.   She feels well overall.  We discussed the results of her echocardiogram which showed preserved EF, grade 1 diastolic dysfunction, normal RVSP, moderately dilated LA, no significant valvular heart disease.  Specifically there is no wall motion abnormalities reported to suggest concern with nonspecific T wave abnormalities on ECG at our office.  In addition no structural abnormalities to suggest an etiology for the new right bundle  branch block noted on primary care ECG.  We discussed the waxing and waning nature of some conduction delay.  She remains asymptomatic.  The patient does not have symptoms concerning for COVID-19 infection (fever, chills, cough, or new shortness of breath).    Past Medical History:  Diagnosis Date  . Arthritis   . Chronic kidney disease    self reports ckd stage 3   . Diabetes mellitus, type II, insulin dependent ([place])    With neurologic complications. Bilateral lower extremity peripheral neuropathy  . Dyspnea    with excertion; no issues now since weight loss surgery   . Endometriosis   . Essential hypertension   . GERD (gastroesophageal reflux disease)   . 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 Hypertensive nephropathy 07/05/2018  . Hypothyroidism   . Hypothyroidism 02/06/2018  . Morbid obesity with BMI of 50.0-59.9, adult ([place])   . Scoliosis    Past Surgical History:  Procedure Laterality Date  . ABDOMINAL HYSTERECTOMY     uterus  . CHOLECYSTECTOMY    . EYE SURGERY     cataract extraction bilateral   . JOINT REPLACEMENT     Left knee  . KNEE ARTHROPLASTY Right 07/30/2017   Procedure: RIGHT TOTAL KNEE ARTHROPLASTY WITH COMPUTER NAVIGATION;  Surgeon: Rod Can, MD;  Location: [facility];  Service: Orthopedics;  Laterality: Right;  Needs RNFA  . LAPAROSCOPIC GASTRIC BANDING     and reversal  . LAPAROSCOPIC GASTRIC SLEEVE RESECTION N/A 08/26/2016   Procedure: LAPAROSCOPIC GASTRIC SLEEVE RESECTION WITH UPPER ENOD;  Surgeon: Johnathan Hausen, MD;  Location: [facility];  Service: General;  Laterality: N/A;  . TONSILLECTOMY    . TRANSTHORACIC ECHOCARDIOGRAM  11/2013   EF 65-70%. Normal diastolic Fxn.  Normal Valves.     Current Meds  Medication Sig  . amLODipine (NORVASC) 5 MG tablet Take 1 tablet (5 mg total) by mouth daily.  . B Complex-C (B-COMPLEX WITH VITAMIN C) tablet Take 1 tablet by mouth daily.  . Biotin 10000 MCG TABS Take 1 tablet  by mouth daily.  . carvedilol (COREG CR) 20 MG 24 hr capsule TAKE 1 CAPSULE DAILY  . Cholecalciferol 5000 units TABS Take 5,000 Units by mouth daily.  . Colchicine 0.6 MG CAPS TAKE 1 CAPSULE DAILY AS    NEEDED  . Cyanocobalamin (VITAMIN B-12) 2500 MCG SUBL Take 2,500 mcg by mouth daily.  . diclofenac Sodium (VOLTAREN) 1 % GEL APPLY 2 GRAMS TO AFFECTED AREA(S) 4 TIMES DAILY AS NEEDED  . docusate sodium (COLACE) 100 MG capsule Take 1 capsule (100 mg total) by mouth 2 (two) times daily.  [name] gabapentin (NEURONTIN) 300 MG capsule Take 600 mg by mouth at bedtime. Pt taking 600 MG at bedtime  . glucose blood (ONETOUCH VERIO) test strip Use as instructed to check blood sugars 2 times per day dx: e11.65  . insulin degludec (TRESIBA FLEXTOUCH) 100 UNIT/ML SOPN FlexTouch Pen Inject 0.09 mLs (9 Units total) into the skin daily at 10 pm. (Patient taking differently: Inject 8 Units into the skin daily at 10 pm.)  . losartan (COZAAR) 100 MG tablet Take 1 tablet (100 mg total) by mouth daily.  . Multiple Vitamins-Minerals (MULTIVITAMIN WITH MINERALS) tablet Take 1 tablet by mouth daily. Women's 50+  . naproxen sodium (ANAPROX) 220 MG tablet Take 440 mg by mouth 2 (two) times daily as needed (pain). 2 tablets every night at bedtime  . OneTouch Delica Lancets 61W MISC Use as instructed to check blood sugars 2 times per day dx: e11.65  . OZEMPIC, 1 MG/DOSE, 2 MG/1.5ML SOPN INJECT INTO SKIN ONCE A WEEK (Patient taking differently: 1mg )  . simvastatin (ZOCOR) 10 MG tablet Take 1 tablet (10 mg total) by mouth daily.  [name] SYNTHROID 25 MCG tablet Take 0.5 tablet (12.5 mcg) by mouth on Mondays through Fridays, then take 1 tablet (25 mcg) by mouth on Saturdays and Sundays.     Allergies:   Meloxicam and Other   Social History   Tobacco Use  . Smoking status: Former Smoker    Packs/day: 0.25    Years: 10.00    Pack years: 2.50    Types: Cigarettes  . Smokeless tobacco: Never Used  . Tobacco comment: quit 20 years  ago  Vaping Use  . Vaping Use: Never used  Substance Use Topics  . Alcohol use: Not Currently    Alcohol/week: 1.0 standard drink    Types: 1 Glasses of wine per week    Comment: occasional  . Drug use: No     Family Hx: The patient's family history includes Heart attack in her mother; Heart failure in her mother; Stroke in her father.  ROS:   Please see the history of present illness.     All other systems reviewed and are negative.   Prior CV studies:   The following studies were reviewed today:  Echo 06/05/20  Labs/Other Tests and Data Reviewed:    EKG:  No ECG reviewed.  Recent Labs: 05/31/2020: ALT 24; BUN 14; Creatinine, Ser 0.95; Hemoglobin 11.4; Platelets 201; Potassium 4.1; Sodium 141; TSH 2.840  Recent Lipid Panel Lab Results  Component Value Date/Time   CHOL 150 02/09/2020 02:43 PM   TRIG 55 02/09/2020 02:43 PM   HDL 61 02/09/2020 02:43 PM   CHOLHDL 2.5 02/09/2020 02:43 PM   LDLCALC 77 02/09/2020 02:43 PM    Wt Readings from Last 3 Encounters:  05/31/20 214 lb (97.1 kg)  05/07/20 210 lb (95.3 kg)  03/30/20 212 lb 9.6 oz (96.4 kg)     Risk Assessment/Calculations:      Objective:    Vital Signs:  BP (!) 147/74    VITAL SIGNS:  reviewed GEN:  no acute distress EYES:  sclerae anicteric, EOMI - Extraocular Movements Intact RESPIRATORY:  normal respiratory effort, symmetric expansion CARDIOVASCULAR:  no peripheral edema SKIN:  no rash, lesions or ulcers. MUSCULOSKELETAL:  no obvious deformities. NEURO:  alert and oriented x 3, no obvious focal deficit PSYCH:  normal affect  ASSESSMENT & PLAN:    1. Preoperative cardiovascular examination   2. Abnormal EKG   3. Essential hypertension   4. COVID-19 virus infection   5. Type 2 diabetes mellitus with stage 3 chronic kidney disease, with long-term current use of insulin, unspecified whether stage 3a or 3b CKD (HCC)   6. Stage 3 chronic kidney disease, unspecified whether stage 3a or 3b CKD ([place])      Preoperative cardiovascular examination Abnormal EKG -No clinical or worrisome findings on echocardiogram recently performed.  We discussed preoperative risk stratification. -The patient is intermediate risk for intermediate risk procedure.  No further cardiovascular testing is required prior to the procedure.  If this level of risk is acceptable to the patient and surgical team, the patient should be considered optimized from a cardiovascular standpoint.  The patient understands this risk categorization, and is willing to proceed.  Essential hypertension-blood pressure remains elevated today, similar to our last visit.  She is home today, less likely to represent whitecoat hypertension.  She is in the process of transitioning from carvedilol to metoprolol succinate.  She may need additional blood pressure therapy with this in mind.  Consider up titration of amlodipine to 10 mg daily, otherwise consider addition of therapy such as spironolactone if no laboratory abnormalities prove to be a contraindication.  This can be coordinated by her primary care doctor.       COVID-19 Education: The signs and symptoms of COVID-19 were discussed with the patient and how to seek care for testing (follow up with PCP or arrange E-visit).  The importance of social distancing was discussed today.  Time:   Today, I have spent 15 minutes with the patient with telehealth technology discussing the above problems.     Medication Adjustments/Labs and Tests Ordered: Current medicines are reviewed at length with the patient today.  Concerns regarding medicines are outlined above.   Patient Instructions  Medication Instructions:  No Changes In Medications at this time.  *If you need a refill on your cardiac medications before your next appointment, please call your pharmacy*  Follow-Up: At [facility], you and your health needs are our priority.  As part of our continuing mission to provide you with  exceptional heart care, we have created designated Provider Care Teams.  These Care Teams include your primary Cardiologist (physician) and Advanced Practice Providers (APPs -  Physician Assistants and Nurse Practitioners) who all work together to provide you with the care you need, when you need it.   Your next appointment:   6 month(s)  The format for your next appointment:  In Person  Provider:   Cherlynn Kaiser, MD    Signed, Elouise Munroe, MD  06/20/2020 9:02 AM    [place]

## 2020-06-21 ENCOUNTER — Other Ambulatory Visit (HOSPITAL_COMMUNITY): Payer: Medicare HMO

## 2020-06-21 ENCOUNTER — Ambulatory Visit: Payer: Medicare HMO | Admitting: Addiction (Substance Use Disorder)

## 2020-06-21 DIAGNOSIS — Z8616 Personal history of COVID-19: Secondary | ICD-10-CM | POA: Insufficient documentation

## 2020-06-22 ENCOUNTER — Ambulatory Visit: Payer: Medicare HMO | Admitting: Addiction (Substance Use Disorder)

## 2020-06-22 ENCOUNTER — Other Ambulatory Visit: Payer: Self-pay | Admitting: Nurse Practitioner

## 2020-06-22 DIAGNOSIS — E1122 Type 2 diabetes mellitus with diabetic chronic kidney disease: Secondary | ICD-10-CM

## 2020-06-22 DIAGNOSIS — I129 Hypertensive chronic kidney disease with stage 1 through stage 4 chronic kidney disease, or unspecified chronic kidney disease: Secondary | ICD-10-CM | POA: Diagnosis not present

## 2020-06-22 DIAGNOSIS — K7460 Unspecified cirrhosis of liver: Secondary | ICD-10-CM

## 2020-06-22 DIAGNOSIS — N1831 Chronic kidney disease, stage 3a: Secondary | ICD-10-CM

## 2020-06-22 DIAGNOSIS — Z794 Long term (current) use of insulin: Secondary | ICD-10-CM | POA: Diagnosis not present

## 2020-06-26 ENCOUNTER — Encounter: Payer: Medicare HMO | Admitting: Surgical

## 2020-06-29 ENCOUNTER — Telehealth: Payer: Medicare HMO

## 2020-07-02 ENCOUNTER — Telehealth: Payer: Self-pay

## 2020-07-02 NOTE — Telephone Encounter (Signed)
  Chronic Care Management   Outreach Note  07/02/2020 Name: Teresa Golden MRN: [id number] DOB: 1945/06/16  Referred by: Glendale Chard, MD Reason for referral : Chronic Care Management (Inbound Call from patient )   Received a voice message from patient requesting a call back to schedule a nurse visit for a BP. Patient stated she started her new BP medication on 06/28/20. Communicated this message to the office staff and a BP nurse f/u visit has been scheduled. The patient was referred to the case management team for assistance with care management and care coordination.   Follow Up Plan: Telephone follow up appointment with care management team member scheduled for: 07/27/20  Barb Merino, RN, BSN, CCM Care Management Coordinator [facility]  Direct Phone: [phone]

## 2020-07-03 ENCOUNTER — Ambulatory Visit (INDEPENDENT_AMBULATORY_CARE_PROVIDER_SITE_OTHER): Payer: Medicare HMO | Admitting: Surgical

## 2020-07-03 ENCOUNTER — Other Ambulatory Visit: Payer: Self-pay

## 2020-07-03 ENCOUNTER — Encounter: Payer: Medicare HMO | Admitting: Surgical

## 2020-07-03 ENCOUNTER — Encounter: Payer: Self-pay | Admitting: Surgical

## 2020-07-03 VITALS — BP 178/83 | HR 61 | Ht 65.0 in | Wt 215.0 lb

## 2020-07-03 DIAGNOSIS — M793 Panniculitis, unspecified: Secondary | ICD-10-CM

## 2020-07-03 DIAGNOSIS — M545 Low back pain, unspecified: Secondary | ICD-10-CM

## 2020-07-03 DIAGNOSIS — G8929 Other chronic pain: Secondary | ICD-10-CM

## 2020-07-03 MED ORDER — CEPHALEXIN 500 MG PO CAPS: 500.0000 mg | ORAL_CAPSULE | Freq: Four times a day (QID) | ORAL | 0 refills | Status: AC

## 2020-07-03 MED ORDER — ONDANSETRON HCL 4 MG PO TABS: 4.0000 mg | ORAL_TABLET | Freq: Three times a day (TID) | ORAL | 0 refills | Status: DC | PRN

## 2020-07-03 MED ORDER — HYDROCODONE-ACETAMINOPHEN 5-325 MG PO TABS: 1.0000 | ORAL_TABLET | Freq: Four times a day (QID) | ORAL | 0 refills | Status: AC | PRN

## 2020-07-03 NOTE — Progress Notes (Signed)
[name], female    DOB: [birth year], 75 y.o.   MRN: [id number]  Chief Complaint  Patient presents with  . Pre-op Exam      ICD-10-CM   1. Panniculitis  M79.3   2. Chronic bilateral low back pain without sciatica  M54.50    G89.29      History of Present Illness: Teresa Golden is a 75 y.o.  female  with a history of panniculitis.  She presents for preoperative evaluation for upcoming procedure, panniculectomy, scheduled for 07/30/2020 with Dr. Marla Roe.  The patient has not had problems with anesthesia. No history of DVT/PE.  No family history of DVT/PE.  No family or personal history of bleeding or clotting disorders.  Patient is not currently taking any blood thinners.    PMH Significant for: Diabetes mellitus, liver cirrhosis, hx of hepatitis C, hypertension, hyperlipidemia, hypothyroidism.  Most recent A1c 6.2. hx of gastric sleeve  Patient was seen for preoperative cardiovascular clearance at [facility].  Per cardiology provider patient is intermediate risk for intermediate risk procedure.  No further cardiovascular testing required.  If the level of risk is acceptable for the patient and surgical team patient would be considered optimized.  Patient reports she is overall doing well.  She reports that she is a little bit anxious for surgery but otherwise is feeling well.  She reports that she saw cardiology and is aware that they stated that she was of intermediate risk.  She reports that she would like to stay overnight for surgery because she has no one at home to assist her.  She reports that she would like some nursing assistance for 1 week after surgery.  She is not having any infectious symptoms.  Past Medical History: Allergies: Allergies  Allergen Reactions  . Meloxicam Other (See Comments)    Fever; muscle aches; "flu-like" symptoms  . Other     Rose fever and hay fever     Current Medications:  Current Outpatient Medications:  .   amLODipine (NORVASC) 5 MG tablet, Take 1 tablet (5 mg total) by mouth daily., Disp: 90 tablet, Rfl: 1 .  B Complex-C (B-COMPLEX WITH VITAMIN C) tablet, Take 1 tablet by mouth daily., Disp: , Rfl:  .  Biotin 10000 MCG TABS, Take 1 tablet by mouth daily., Disp: , Rfl:  .  Cholecalciferol 5000 units TABS, Take 5,000 Units by mouth daily., Disp: , Rfl:  .  Colchicine 0.6 MG CAPS, TAKE 1 CAPSULE DAILY AS    NEEDED, Disp: 90 capsule, Rfl: 1 .  diclofenac Sodium (VOLTAREN) 1 % GEL, APPLY 2 GRAMS TO AFFECTED AREA(S) 4 TIMES DAILY AS NEEDED, Disp: 200 g, Rfl: 2 .  docusate sodium (COLACE) 100 MG capsule, Take 1 capsule (100 mg total) by mouth 2 (two) times daily., Disp: 60 capsule, Rfl: 1 .  gabapentin (NEURONTIN) 300 MG capsule, Take 600 mg by mouth at bedtime. Pt taking 600 MG at bedtime, Disp: , Rfl:  .  glucose blood (ONETOUCH VERIO) test strip, Use as instructed to check blood sugars 2 times per day dx: e11.65, Disp: 300 each, Rfl: 2 .  insulin degludec (TRESIBA FLEXTOUCH) 100 UNIT/ML SOPN FlexTouch Pen, Inject 0.09 mLs (9 Units total) into the skin daily at 10 pm. (Patient taking differently: Inject 8 Units into the skin daily at 10 pm.), Disp: 5 pen, Rfl: 2 .  losartan (COZAAR) 100 MG tablet, Take 1 tablet (100 mg total) by mouth daily., Disp:  90 tablet, Rfl: 2 .  metoprolol succinate (TOPROL-XL) 25 MG 24 hr tablet, Take 1/2 tablet by mouth daily at bedtime  for 6 days and then 1 tablet by mouth daily at bedtime (Patient taking differently: Take 1/2 tablet by mouth daily at bedtime  for 6 days and then 1 tablet by mouth daily at bedtime), Disp: 30 tablet, Rfl: 1 .  Multiple Vitamins-Minerals (MULTIVITAMIN WITH MINERALS) tablet, Take 1 tablet by mouth daily. Women's 50+, Disp: , Rfl:  .  naproxen sodium (ANAPROX) 220 MG tablet, Take 440 mg by mouth 2 (two) times daily as needed (pain). 2 tablets every night at bedtime, Disp: , Rfl:  .  OneTouch Delica Lancets 48G MISC, Use as instructed to check blood  sugars 2 times per day dx: e11.65, Disp: 300 each, Rfl: 2 .  OZEMPIC, 1 MG/DOSE, 2 MG/1.5ML SOPN, INJECT INTO SKIN ONCE A WEEK (Patient taking differently: 1mg ), Disp: 9 pen, Rfl: 1 .  simvastatin (ZOCOR) 10 MG tablet, Take 1 tablet (10 mg total) by mouth daily., Disp: 90 tablet, Rfl: 1 .  SYNTHROID 25 MCG tablet, Take 0.5 tablet (12.5 mcg) by mouth on Mondays through Fridays, then take 1 tablet (25 mcg) by mouth on Saturdays and Sundays., Disp: 90 tablet, Rfl: 1 .  carvedilol (COREG CR) 20 MG 24 hr capsule, TAKE 1 CAPSULE DAILY, Disp: 90 capsule, Rfl: 2 .  Cyanocobalamin (VITAMIN B-12) 2500 MCG SUBL, Take 2,500 mcg by mouth daily., Disp: , Rfl:   Past Medical Problems: Past Medical History:  Diagnosis Date  . Arthritis   . Chronic kidney disease    self reports ckd stage 3   . Diabetes mellitus, type II, insulin dependent ([place])    With neurologic complications. Bilateral lower extremity peripheral neuropathy  . Dyspnea    with excertion; no issues now since weight loss surgery   . Endometriosis   . Essential hypertension   . GERD (gastroesophageal reflux disease)   . 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 Hypertensive nephropathy 07/05/2018  . Hypothyroidism   . Hypothyroidism 02/06/2018  . Morbid obesity with BMI of 50.0-59.9, adult ([place])   . Scoliosis     Past Surgical History: Past Surgical History:  Procedure Laterality Date  . ABDOMINAL HYSTERECTOMY     uterus  . CHOLECYSTECTOMY    . EYE SURGERY     cataract extraction bilateral   . JOINT REPLACEMENT     Left knee  . KNEE ARTHROPLASTY Right 07/30/2017   Procedure: RIGHT TOTAL KNEE ARTHROPLASTY WITH COMPUTER NAVIGATION;  Surgeon: Rod Can, MD;  Location: [facility];  Service: Orthopedics;  Laterality: Right;  Needs RNFA  . LAPAROSCOPIC GASTRIC BANDING     and reversal  . LAPAROSCOPIC GASTRIC SLEEVE RESECTION N/A 08/26/2016   Procedure: LAPAROSCOPIC GASTRIC SLEEVE RESECTION WITH  UPPER ENOD;  Surgeon: Johnathan Hausen, MD;  Location: [facility];  Service: General;  Laterality: N/A;  . TONSILLECTOMY    . TRANSTHORACIC ECHOCARDIOGRAM  11/2013   EF 65-70%. Normal diastolic Fxn.  Normal Valves.    Social History: Social History   Socioeconomic History  . Marital status: Single    Spouse name: Not on file  . Number of children: Not on file  . Years of education: Not on file  . Highest education level: Not on file  Occupational History  . Occupation: retired  Tobacco Use  . Smoking status: Former Smoker    Packs/day: 0.25  Years: 10.00    Pack years: 2.50    Types: Cigarettes  . Smokeless tobacco: Never Used  . Tobacco comment: quit 20 years ago  Vaping Use  . Vaping Use: Never used  Substance and Sexual Activity  . Alcohol use: Not Currently    Alcohol/week: 1.0 standard drink    Types: 1 Glasses of wine per week    Comment: occasional  . Drug use: No  . Sexual activity: Yes  Other Topics Concern  . Not on file  Social History Narrative  . Not on file   Social Determinants of Health   Financial Resource Strain: Medium Risk  . Difficulty of Paying Living Expenses: Somewhat hard  Food Insecurity: No Food Insecurity  . Worried About Charity fundraiser in the Last Year: Never true  . Ran Out of Food in the Last Year: Never true  Transportation Needs: Unmet Transportation Needs  . Lack of Transportation (Medical): No  . Lack of Transportation (Non-Medical): Yes  Physical Activity: Insufficiently Active  . Days of Exercise per Week: 3 days  . Minutes of Exercise per Session: 40 min  Stress: No Stress Concern Present  . Feeling of Stress : Not at all  Social Connections: Not on file  Intimate Partner Violence: Not At Risk  . Fear of Current or Ex-Partner: No  . Emotionally Abused: No  . Physically Abused: No  . Sexually Abused: No    Family History: Family History  Problem Relation Age of Onset  . Heart attack Mother   . Heart failure  Mother   . Stroke Father     Review of Systems: ROS  Physical Exam: Vital Signs BP (!) 178/83 (BP Location: Left Arm, Patient Position: Sitting, Cuff Size: Large)   Pulse 61   Ht 5\' 5"  (1.651 m)   Wt 215 lb (97.5 kg)   SpO2 98%   BMI 35.78 kg/m   Physical Exam Constitutional:      General: Not in acute distress.    Appearance: Normal appearance. Not ill-appearing.  HENT:     Head: Normocephalic and atraumatic.  Eyes:     Pupils: Pupils are equal, round Neck:     Musculoskeletal: Normal range of motion.  Cardiovascular:     Rate and Rhythm: Normal rate    Pulses: Normal pulses.  Pulmonary:     Effort: Pulmonary effort is normal. No respiratory distress.  Abdominal:     General: Abdomen is flat. There is no distension.  Musculoskeletal: Normal range of motion.  Skin:    General: Skin is warm and dry.     Findings: No erythema or rash.  Neurological:     General: No focal deficit present.     Mental Status: Alert and oriented to person, place, and time. Mental status is at baseline.     Motor: No weakness.  Psychiatric:        Mood and Affect: Mood normal.        Behavior: Behavior normal.    Assessment/Plan: The patient is scheduled for panniculectomy with Dr. Marla Roe.  Risks, benefits, and alternatives of procedure discussed, questions answered and consent obtained.    Smoking Status: Former smoker; Counseling Given?  N/A  Caprini Score: 7, high; Risk Factors include: Age, BMI greater than 25, insulin-dependent diabetes and length of planned surgery. Recommendation for mechanical and pharmacological prophylaxis for 7 to 10 days postoperatively. Encourage early ambulation.  Will discuss with surgical team if patient will require postoperative Lovenox given her  risks.  Pictures obtained: 01/06/2020  Post-op Rx sent to pharmacy: Norco, Zofran, Keflex  Patient was provided with the General Surgical Risk consent document and Pain Medication Agreement prior to  their appointment.  They had adequate time to read through the risk consent documents and Pain Medication Agreement. We also discussed them in person together during this preop appointment. All of their questions were answered to their satisfaction.  Recommended calling if they have any further questions.  Risk consent form and Pain Medication Agreement to be scanned into patient's chart.  The risk that can be encountered for this procedure were discussed and include the following but not limited to these: asymmetry, fluid accumulation, firmness of the tissue, skin loss, decrease or no sensation, fat necrosis, bleeding, infection, healing delay.  Deep vein thrombosis, cardiac and pulmonary complications are risks to any procedure.  There are risks of anesthesia, changes to skin sensation and injury to nerves or blood vessels.  The muscle can be temporarily or permanently injured.  You may have an allergic reaction to tape, suture, glue, blood products which can result in skin discoloration, swelling, pain, skin lesions, poor healing.  Any of these can lead to the need for revisonal surgery or stage procedures.  Weight gain and weigh loss can also effect the long term appearance.   Patient is aware with her chronic conditions that she is at an increased risk of postoperative complications including incisional dehiscence, postoperative wounds, need for additional procedures.  We discussed that due to the level of her umbilicus that we may need to remove her umbilicus with the surgery otherwise she would likely still have an overhanging pannus.  Patient is understanding of this and reports that she is okay with her bellybutton being removed.   Electronically signed by: Carola Rhine Darcee Dekker, PA-C 07/03/2020 10:09 AM

## 2020-07-04 ENCOUNTER — Telehealth: Payer: Self-pay

## 2020-07-04 ENCOUNTER — Ambulatory Visit (INDEPENDENT_AMBULATORY_CARE_PROVIDER_SITE_OTHER): Payer: Medicare HMO

## 2020-07-04 ENCOUNTER — Other Ambulatory Visit: Payer: Self-pay | Admitting: Surgical

## 2020-07-04 DIAGNOSIS — Z794 Long term (current) use of insulin: Secondary | ICD-10-CM

## 2020-07-04 DIAGNOSIS — E1122 Type 2 diabetes mellitus with diabetic chronic kidney disease: Secondary | ICD-10-CM

## 2020-07-04 DIAGNOSIS — I129 Hypertensive chronic kidney disease with stage 1 through stage 4 chronic kidney disease, or unspecified chronic kidney disease: Secondary | ICD-10-CM

## 2020-07-04 MED ORDER — ENOXAPARIN SODIUM 40 MG/0.4ML IJ SOSY: 40.0000 mg | PREFILLED_SYRINGE | INTRAMUSCULAR | 0 refills | Status: DC

## 2020-07-04 NOTE — Telephone Encounter (Signed)
Patient states that she called her insurance company who said she is able to obtain home health services after her panniculectomy but would need a referral from our office. She is requesting that we send the referral to [facility] and call her with an update.

## 2020-07-04 NOTE — Patient Instructions (Signed)
Social Worker Visit Information  Goals we discussed today:  Goals Addressed            This Visit's Progress   . Work with SW to manage care coordination needs       Timeframe:  Long-Range Goal Priority:  [employer] Start Date:   12.16.21                          Expected End Date:   3.16.22                    Next date of contact: 6.13.22  Patient Goals/Self-Care Activities Over the next 45 days patient will:   - Patient will self administer medications as prescribed -Patient will attend all scheduled provider appointments -Patient will call provider office for new concerns or questions -Contact SW as needed prior to next scheduled call        Follow Up Plan: SW will follow up with patient by phone over the next month  Daneen Schick, BSW, CDP Social Worker, Certified Dementia Practitioner [place] [facility] [place] [facility] [phone]

## 2020-07-04 NOTE — Chronic Care Management (AMB) (Signed)
Chronic Care Management    Social Work Note  07/04/2020 Name: Teresa Golden MRN: [id number] DOB: February 24, 1946  Teresa Golden is a 75 y.o. year old female who is a primary care patient of Glendale Chard, MD. The CCM team was consulted to assist the patient with chronic disease management and/or care coordination needs related to: [employer] .   Engaged with patient by telephone for follow up visit in response to provider referral for social work chronic care management and care coordination services.   Consent to Services:  The patient was given information about Chronic Care Management services, agreed to services, and gave verbal consent prior to initiation of services.  Please see initial visit note for detailed documentation.   Patient agreed to services and consent obtained.   Assessment: Review of patient past medical history, allergies, medications, and health status, including review of relevant consultants reports was performed today as part of a comprehensive evaluation and provision of chronic care management and care coordination services.     SDOH (Social Determinants of Health) assessments and interventions performed:    Advanced Directives Status: Not addressed in this encounter.  CCM Care Plan  Allergies  Allergen Reactions  . Meloxicam Other (See Comments)    Fever; muscle aches; "flu-like" symptoms  . Other     Rose fever and hay fever     Outpatient Encounter Medications as of 07/04/2020  Medication Sig Note  . amLODipine (NORVASC) 5 MG tablet Take 1 tablet (5 mg total) by mouth daily.   . B Complex-C (B-COMPLEX WITH VITAMIN C) tablet Take 1 tablet by mouth daily.   . Biotin 10000 MCG TABS Take 1 tablet by mouth daily.   . carvedilol (COREG CR) 20 MG 24 hr capsule TAKE 1 CAPSULE DAILY   . cephALEXin (KEFLEX) 500 MG capsule Take 1 capsule (500 mg total) by mouth 4 (four) times daily for 3 days.   . Cholecalciferol 5000 units TABS Take 5,000 Units by mouth  daily.   . Colchicine 0.6 MG CAPS TAKE 1 CAPSULE DAILY AS    NEEDED 06/20/2020: Patient takes as needed  . Cyanocobalamin (VITAMIN B-12) 2500 MCG SUBL Take 2,500 mcg by mouth daily.   . diclofenac Sodium (VOLTAREN) 1 % GEL APPLY 2 GRAMS TO AFFECTED AREA(S) 4 TIMES DAILY AS NEEDED   . docusate sodium (COLACE) 100 MG capsule Take 1 capsule (100 mg total) by mouth 2 (two) times daily.   [name] enoxaparin (LOVENOX) 40 MG/0.4ML injection Inject 0.4 mLs (40 mg total) into the skin daily for 7 days. Start 24 hours after your surgery on 07/30/2020.   . gabapentin (NEURONTIN) 300 MG capsule Take 600 mg by mouth at bedtime. Pt taking 600 MG at bedtime   . glucose blood (ONETOUCH VERIO) test strip Use as instructed to check blood sugars 2 times per day dx: e11.65   . HYDROcodone-acetaminophen (NORCO) 5-325 MG tablet Take 1 tablet by mouth every 6 (six) hours as needed for up to 5 days for severe pain.   [name] insulin degludec (TRESIBA FLEXTOUCH) 100 UNIT/ML SOPN FlexTouch Pen Inject 0.09 mLs (9 Units total) into the skin daily at 10 pm. (Patient taking differently: Inject 8 Units into the skin daily at 10 pm.)   . losartan (COZAAR) 100 MG tablet Take 1 tablet (100 mg total) by mouth daily.   . metoprolol succinate (TOPROL-XL) 25 MG 24 hr tablet Take 1/2 tablet by mouth daily at bedtime  for 6 days and then 1 tablet  by mouth daily at bedtime (Patient taking differently: Take 1/2 tablet by mouth daily at bedtime  for 6 days and then 1 tablet by mouth daily at bedtime) 06/20/2020: Patient has not started   . Multiple Vitamins-Minerals (MULTIVITAMIN WITH MINERALS) tablet Take 1 tablet by mouth daily. Women's 50+   . naproxen sodium (ANAPROX) 220 MG tablet Take 440 mg by mouth 2 (two) times daily as needed (pain). 2 tablets every night at bedtime   . ondansetron (ZOFRAN) 4 MG tablet Take 1 tablet (4 mg total) by mouth every 8 (eight) hours as needed for nausea or vomiting.   Glory Rosebush Delica Lancets 18E MISC Use as instructed to  check blood sugars 2 times per day dx: e11.65   . OZEMPIC, 1 MG/DOSE, 2 MG/1.5ML SOPN INJECT INTO SKIN ONCE A WEEK (Patient taking differently: 1mg )   . simvastatin (ZOCOR) 10 MG tablet Take 1 tablet (10 mg total) by mouth daily.   [name] SYNTHROID 25 MCG tablet Take 0.5 tablet (12.5 mcg) by mouth on Mondays through Fridays, then take 1 tablet (25 mcg) by mouth on Saturdays and Sundays.    No facility-administered encounter medications on file as of 07/04/2020.    Patient Active Problem List   Diagnosis Date Noted  . Type 2 diabetes mellitus with stage 3 chronic kidney disease, with long-term current use of insulin ([place]) 11/29/2019  . Panniculitis 09/30/2019  . Back pain 09/30/2019  . Hepatoma ([place]) 01/21/2019  . Other cirrhosis of liver ([place]) 07/05/2018  . Hypertensive nephropathy 07/05/2018  . Chronic renal disease, stage III ([place]) 07/05/2018  . Chronic left shoulder pain 07/05/2018  . Hypothyroidism 02/06/2018  . Hepatitis C virus infection cured after antiviral drug therapy 02/04/2018  . Osteoarthritis of right knee 07/30/2017  . S/P laparoscopic sleeve gastrectomy July 2018 08/26/2016  . Preop cardiovascular exam 07/30/2016  . Dyspnea on exertion 07/30/2016  . Class 2 severe obesity due to excess calories with serious comorbidity and body mass index (BMI) of 39.0 to 39.9 in adult ([place]) 07/30/2016  . Bilateral lower extremity edema 07/30/2016    Conditions to be addressed/monitored: HTN, DMII and CKD Stage III; Upcoming surgical procedure  Care Plan : Social Work Care Plan  Updates made by Daneen Schick since 07/04/2020 12:00 AM    Problem: Care Coordination     Long-Range Goal: Collaborate with RN Care Manager to perform appropriate assessments to assist with care coordination needs   Start Date: 03/02/2020  Expected End Date: 06/30/2020  This Visit's Progress: On track  Recent Progress: On track  Priority: Low  Note:   Current Barriers:  . Chronic conditions including DM II,  CKD III, and HTN which put patient at increased risk of hospitalization  Social Work Clinical Goal(s):  [name] Over the next 120 days, patient will work with SW to address concerns related to care coordination  Interventions: . 1:1 collaboration with Glendale Chard, MD regarding development and update of comprehensive plan of care as evidenced by provider attestation and co-signature . Inter-disciplinary care team collaboration (see longitudinal plan of care) . Successful outbound call placed to the patient to assess for care coordination needs . Discussed the patient did attend her pre-op appointment on 5.10.22 to determine she will be staying in the hospital overnight and will be discharged home with two drains and dissolvable stitches . Patient has contacted her health plan to determine which home health agencies are in network and has selected [facility] to provide home health services post surgery .  Determined the patient is in process of identifying a DME agency to deliver a bedside commode to her home . Advised the patient she may be able to obtain this equipment post surgery from the hospital liaison . Scheduled follow up call over the next month to check on patient after surgery  Patient Goals/Self-Care Activities Over the next 60 days, patient will:   - Patient will self administer medications as prescribed Patient will attend all scheduled provider appointments Patient will call provider office for new concerns or questions Contact SW as needed prior to next scheduled call  Follow up Plan: SW will follow up with patient by phone over the next 45 days       Follow Up Plan: SW will follow up with patient by phone over the next month      Daneen Schick, BSW, CDP Social Worker, Certified Dementia Practitioner [place] [facility] [place] [facility] [phone]  Total time spent performing care coordination and/or care management activities with the patient by phone or face to face = 20 minutes.

## 2020-07-04 NOTE — Chronic Care Management (AMB) (Signed)
Called patient for an appointment reminder with [name], [place] on 07-05-2020 at 2:00. Instructed patient to have all meds/supplements and logs available for review. Patient voiced understanding.  [place]  [phone]

## 2020-07-05 ENCOUNTER — Ambulatory Visit: Payer: Self-pay

## 2020-07-05 ENCOUNTER — Telehealth: Payer: Self-pay

## 2020-07-05 ENCOUNTER — Ambulatory Visit: Payer: Medicare HMO | Admitting: Addiction (Substance Use Disorder)

## 2020-07-05 DIAGNOSIS — E1122 Type 2 diabetes mellitus with diabetic chronic kidney disease: Secondary | ICD-10-CM

## 2020-07-05 DIAGNOSIS — N183 Chronic kidney disease, stage 3 unspecified: Secondary | ICD-10-CM

## 2020-07-05 DIAGNOSIS — Z794 Long term (current) use of insulin: Secondary | ICD-10-CM

## 2020-07-05 DIAGNOSIS — M793 Panniculitis, unspecified: Secondary | ICD-10-CM

## 2020-07-05 DIAGNOSIS — I129 Hypertensive chronic kidney disease with stage 1 through stage 4 chronic kidney disease, or unspecified chronic kidney disease: Secondary | ICD-10-CM

## 2020-07-05 NOTE — Progress Notes (Addendum)
Chronic Care Management Pharmacy Note  07/17/2020 Name:  Teresa Golden MRN:  [id number] DOB:  [birth year]  Subjective: Teresa Golden is an 75 y.o. year old female who is a primary patient of Glendale Chard, MD.  The CCM team was consulted for assistance with disease management and care coordination needs.  Patient reports that she is going to have someone spend the night and pick her up the next day. Patient will be having surgery next month   Engaged with patient by telephone for follow up visit in response to provider referral for pharmacy case management and/or care coordination services.   Consent to Services:  The patient was given information about Chronic Care Management services, agreed to services, and gave verbal consent prior to initiation of services.  Please see initial visit note for detailed documentation.   Patient Care Team: Glendale Chard, MD as PCP - General (Internal Medicine) Elouise Munroe, MD as PCP - Cardiology (Cardiology) Daneen Schick as Social Worker Little, Claudette Stapler, RN as Case Manager Mayford Knife, [facility] (Pharmacist)  Recent office visits: 05/31/2020 PCP OV   Recent consult visits: 07/03/2020 Plastic Surgeon OV  06/20/2020 Perioperative Cardiovascular appointment 06/07/2020 Adjustment Disorder   Hospital visits: None in previous 6 months  Objective:  Lab Results  Component Value Date   CREATININE 0.95 05/31/2020   BUN 14 05/31/2020   GFRNONAA 58 (L) 11/29/2019   GFRAA 67 11/29/2019   NA 141 05/31/2020   K 4.1 05/31/2020   CALCIUM 9.3 05/31/2020   CO2 22 05/31/2020   GLUCOSE 120 (H) 05/31/2020    Lab Results  Component Value Date/Time   HGBA1C 6.2 (H) 05/31/2020 10:41 AM   HGBA1C 6.7 (H) 11/29/2019 02:11 PM   MICROALBUR 10 02/09/2020 10:59 AM   MICROALBUR 10 01/17/2019 06:09 PM    Last diabetic Eye exam:  Lab Results  Component Value Date/Time   HMDIABEYEEXA No Retinopathy 01/11/2020 12:00 AM    Last diabetic Foot exam: No  results found for: HMDIABFOOTEX   Lab Results  Component Value Date   CHOL 150 02/09/2020   HDL 61 02/09/2020   LDLCALC 77 02/09/2020   TRIG 55 02/09/2020   CHOLHDL 2.5 02/09/2020    Hepatic Function Latest Ref Rng & Units 05/31/2020 11/29/2019 08/18/2019  Total Protein 6.0 - 8.5 g/dL 6.7 7.0 7.1  Albumin 3.7 - 4.7 g/dL 3.9 4.0 3.9  AST 0 - 40 IU/L '26 28 28  ' ALT 0 - 32 IU/L '24 30 18  ' Alk Phosphatase 44 - 121 IU/L 79 84 94  Total Bilirubin 0.0 - 1.2 mg/dL 0.4 0.4 0.5    Lab Results  Component Value Date/Time   TSH 2.840 05/31/2020 10:41 AM   TSH 1.230 07/05/2018 11:17 AM   FREET4 1.54 07/05/2018 11:17 AM    CBC Latest Ref Rng & Units 05/31/2020 02/09/2020 01/17/2019  WBC 3.4 - 10.8 x10E3/uL 6.4 10.8 7.1  Hemoglobin 11.1 - 15.9 g/dL 11.4 11.3 11.8  Hematocrit 34.0 - 46.6 % 32.8(L) 34.0 34.5  Platelets 150 - 450 x10E3/uL 201 223 198    Lab Results  Component Value Date/Time   VD25OH 81.3 02/09/2020 02:43 PM    Clinical ASCVD: No  The 10-year ASCVD risk score Mikey Bussing DC Jr., et al., 2013) is: 50.3%   Values used to calculate the score:     Age: 72 years     Sex: Female     Is Non-Hispanic African American: Yes     Diabetic: Yes  Tobacco smoker: Yes     Systolic Blood Pressure: 161 mmHg     Is BP treated: Yes     HDL Cholesterol: 61 mg/dL     Total Cholesterol: 150 mg/dL    Depression screen [facility] 2/9 03/15/2020 08/18/2019 02/08/2019  Decreased Interest 0 0 0  Down, Depressed, Hopeless 2 0 0  PHQ - 2 Score 2 0 0  Altered sleeping 1 0 0  Tired, decreased energy 2 3 0  Change in appetite 0 0 0  Feeling bad or failure about yourself  2 0 0  Trouble concentrating 0 0 0  Moving slowly or fidgety/restless 0 0 0  Suicidal thoughts 0 0 0  PHQ-9 Score 7 3 0  Difficult doing work/chores - Not difficult at all Not difficult at all  Some recent data might be hidden     Social History   Tobacco Use  Smoking Status Former Research scientist (life sciences)  . Packs/day: 0.25  . Years: 10.00  . Pack  years: 2.50  . Types: Cigarettes  Smokeless Tobacco Never Used  Tobacco Comment   quit 20 years ago   BP Readings from Last 3 Encounters:  07/17/20 132/72  07/03/20 (!) 178/83  06/20/20 (!) 147/74   Pulse Readings from Last 3 Encounters:  07/17/20 82  07/03/20 61  05/31/20 61   Wt Readings from Last 3 Encounters:  07/17/20 215 lb 6.4 oz (97.7 kg)  07/03/20 215 lb (97.5 kg)  05/31/20 214 lb (97.1 kg)   BMI Readings from Last 3 Encounters:  07/17/20 41.79 kg/m  07/03/20 35.78 kg/m  05/31/20 30.71 kg/m    Assessment/Interventions: Review of patient past medical history, allergies, medications, health status, including review of consultants reports, laboratory and other test data, was performed as part of comprehensive evaluation and provision of chronic care management services.   SDOH:  (Social Determinants of Health) assessments and interventions performed: No  SDOH Screenings   Alcohol Screen: Not on file  Depression (PHQ2-9): Medium Risk  . PHQ-2 Score: 7  Financial Resource Strain: Medium Risk  . Difficulty of Paying Living Expenses: Somewhat hard  Food Insecurity: No Food Insecurity  . Worried About Charity fundraiser in the Last Year: Never true  . Ran Out of Food in the Last Year: Never true  Housing: Not on file  Physical Activity: Insufficiently Active  . Days of Exercise per Week: 3 days  . Minutes of Exercise per Session: 40 min  Social Connections: Not on file  Stress: No Stress Concern Present  . Feeling of Stress : Not at all  Tobacco Use: Medium Risk  . Smoking Tobacco Use: Former Smoker  . Smokeless Tobacco Use: Never Used  Transportation Needs: Unmet Transportation Needs  . Lack of Transportation (Medical): No  . Lack of Transportation (Non-Medical): Yes    CCM Care Plan  Allergies  Allergen Reactions  . Meloxicam Other (See Comments)    Fever; muscle aches; "flu-like" symptoms  . Other     Rose fever and hay fever     Medications  Reviewed Today    Reviewed by Mayford Knife, [facility] (Pharmacist) on 07/05/20 at 1446  Med List Status: <None>  Medication Order Taking? Sig Documenting Provider Last Dose Status Informant  amLODipine (NORVASC) 5 MG tablet [id number] No Take 1 tablet (5 mg total) by mouth daily. Glendale Chard, MD Taking Active   B Complex-C (B-COMPLEX WITH VITAMIN C) tablet [id number] No Take 1 tablet by mouth daily. [provider] Taking Active  Self  Biotin 10000 MCG TABS [id number] No Take 1 tablet by mouth daily. [provider] Taking Active Self  carvedilol (COREG CR) 20 MG 24 hr capsule [id number] No TAKE 1 CAPSULE DAILY Glendale Chard, MD Taking Active   cephALEXin (KEFLEX) 500 MG capsule [id number]  Take 1 capsule (500 mg total) by mouth 4 (four) times daily for 3 days. Scheeler, Carola Rhine, PA-C  Active   Cholecalciferol 5000 units TABS [id number] No Take 5,000 Units by mouth daily. [provider] Taking Active Self           Med Note Jimmey Ralph, [facility] I   Fri Aug 07, 2017  6:51 PM)    Colchicine 0.6 MG CAPS [id number] No TAKE 1 CAPSULE DAILY AS    NEEDED Glendale Chard, MD Taking Active            Med Note Clinton Gallant, [place] B   Wed Jun 20, 2020  8:45 AM) Patient takes as needed  Cyanocobalamin (VITAMIN B-12) 2500 MCG SUBL [id number] No Take 2,500 mcg by mouth daily. [provider] Taking Active Self  diclofenac Sodium (VOLTAREN) 1 % GEL [id number] No APPLY 2 GRAMS TO AFFECTED AREA(S) 4 TIMES DAILY AS NEEDED Glendale Chard, MD Taking Active   docusate sodium (COLACE) 100 MG capsule [id number] No Take 1 capsule (100 mg total) by mouth 2 (two) times daily. Swinteck, Aaron Edelman, MD Taking Active   enoxaparin (LOVENOX) 40 MG/0.4ML injection [id number]  Inject 0.4 mLs (40 mg total) into the skin daily for 7 days. Start 24 hours after your surgery on 07/30/2020. Scheeler, Carola Rhine, PA-C  Active   gabapentin (NEURONTIN) 300 MG capsule [id number] No Take 600 mg by mouth at bedtime. Pt taking  600 MG at bedtime [provider] Taking Active Self  glucose blood (ONETOUCH VERIO) test strip [id number] No Use as instructed to check blood sugars 2 times per day dx: e11.65 Glendale Chard, MD Taking Active   HYDROcodone-acetaminophen [facility]) 5-325 MG tablet [id number]  Take 1 tablet by mouth every 6 (six) hours as needed for up to 5 days for severe pain. Scheeler, Carola Rhine, PA-C  Active   insulin degludec (TRESIBA FLEXTOUCH) 100 UNIT/ML SOPN FlexTouch Pen [id number] No Inject 0.09 mLs (9 Units total) into the skin daily at 10 pm.  Patient taking differently: Inject 8 Units into the skin daily at 10 pm.   Glendale Chard, MD Taking Active   losartan (COZAAR) 100 MG tablet [id number] No Take 1 tablet (100 mg total) by mouth daily. Glendale Chard, MD Taking Active   metoprolol succinate (TOPROL-XL) 25 MG 24 hr tablet [id number] No Take 1/2 tablet by mouth daily at bedtime  for 6 days and then 1 tablet by mouth daily at bedtime  Patient taking differently: Take 1/2 tablet by mouth daily at bedtime  for 6 days and then 1 tablet by mouth daily at bedtime   Glendale Chard, MD Taking Active            Med Note Clinton Gallant, [place] B   Wed Jun 20, 2020  8:47 AM) Patient has not started   Multiple Vitamins-Minerals (MULTIVITAMIN WITH MINERALS) tablet [id number] No Take 1 tablet by mouth daily. Women's 50+ [provider] Taking Active Self           Med Note Jimmey Ralph, [facility] I   Fri Aug 07, 2017  6:53 PM)    naproxen sodium (ANAPROX) 220 MG tablet [id number] No Take 440 mg by mouth 2 (two) times daily as needed (pain).  2 tablets every night at bedtime [provider] Taking Active Self           Med Note Jimmey Ralph, [facility] I   Fri Aug 07, 2017  6:53 PM)    ondansetron (ZOFRAN) 4 MG tablet [id number]  Take 1 tablet (4 mg total) by mouth every 8 (eight) hours as needed for nausea or vomiting. Scheeler, Carola Rhine, PA-C  Active   OneTouch Delica Lancets 90W MISC [id number] No Use as  instructed to check blood sugars 2 times per day dx: e11.65 Glendale Chard, MD Taking Active   [facility], 1 MG/DOSE, 2 MG/1.5ML Bonney Aid [id number] No INJECT INTO SKIN ONCE A WEEK  Patient taking differently: 57m   SGlendale Chard MD Taking Active   simvastatin (ZOCOR) 10 MG tablet [id number] Take 1 tablet (10 mg total) by mouth daily. SGlendale Chard MD Taking Active   SYNTHROID 25 MCG tablet [id number] Take 0.5 tablet (12.5 mcg) by mouth on Mondays through Fridays, then take 1 tablet (25 mcg) by mouth on Saturdays and Sundays. SGlendale Chard MD Taking Active           Patient Active Problem List   Diagnosis Date Noted  . Type 2 diabetes mellitus with stage 3 chronic kidney disease, with long-term current use of insulin ([place] 11/29/2019  . Panniculitis 09/30/2019  . Back pain 09/30/2019  . Hepatoma ([place] 01/21/2019  . Other cirrhosis of liver ([place] 07/05/2018  . Hypertensive nephropathy 07/05/2018  . Chronic renal disease, stage III ([place] 07/05/2018  . Chronic left shoulder pain 07/05/2018  . Hypothyroidism 02/06/2018  . Hepatitis C virus infection cured after antiviral drug therapy 02/04/2018  . Osteoarthritis of right knee 07/30/2017  . S/P laparoscopic sleeve gastrectomy July 2018 08/26/2016  . Preop cardiovascular exam 07/30/2016  . Dyspnea on exertion 07/30/2016  . Class 2 severe obesity due to excess calories with serious comorbidity and body mass index (BMI) of 39.0 to 39.9 in adult ([place] 07/30/2016  . Bilateral lower extremity edema 07/30/2016    Immunization History  Administered Date(s) Administered  . Fluad Quad(high Dose 65+) 11/10/2018, 11/29/2019  . Influenza, High Dose Seasonal PF 12/21/2017, 11/10/2018  . Influenza,inj,quad, With Preservative 02/25/2016  . Influenza-Unspecified 10/25/2016  . Moderna Sars-Covid-2 Vaccination 03/28/2019, 04/25/2019, 01/02/2020  . Pneumococcal Polysaccharide-23 11/02/2013  . Pneumococcal-Unspecified 11/24/2016  . Zoster Recombinat  (Shingrix) 11/03/2018, 12/31/2018    Conditions to be addressed/monitored:  Hypertension and Diabetes  Care Plan : [place] Updates made by PMayford Knife RCialessince 07/17/2020 12:00 AM    Problem: HTN, HLD, DM II   Priority: High    Long-Range Goal: Disease Management   Start Date: 04/11/2020  Recent Progress: On track  Priority: High  Note:     Current Barriers:  . Does not adhere to prescribed medication regimen   Pharmacist Clinical Goal(s):  .Marland KitchenOver the next 90 days, patient will achieve adherence to monitoring guidelines and medication adherence to achieve therapeutic efficacy through collaboration with PharmD and provider.    Interventions: . 1:1 collaboration with SGlendale Chard MD regarding development and update of comprehensive plan of care as evidenced by provider attestation and co-signature . Inter-disciplinary care team collaboration (see longitudinal plan of care) . Comprehensive medication review performed; medication list updated in electronic medical record   Hypertension (BP goal <130/80) -Uncontrolled -Current treatment: . Carvedilol CR 25 mg tablet once per day  o 27 pills left, she is unable to afford so patient to be started on  different BP medication.  . Losartan 100 mg tablet daily -Medications previously tried:  -Current home readings: 140/80, 141/77, 151/72, 142/70 - checking it before she takes her medication, she uses a wrist cuff  -Current dietary habits:  -Current exercise habits:  -Denies hypotensive/hypertensive symptoms -Educated on BP goals and benefits of medications for prevention of heart attack, stroke and kidney damage; Daily salt intake goal < 2300 mg; Exercise goal of 150 minutes per week; Importance of home blood pressure monitoring; -Counseled to monitor BP at home at least once per day, document, and provide log at future appointments -Counseled on diet and exercise extensively -Patient medication is being  changed from Carvedilol CR to Toprol XL.  -Instructions as follows:Toprol XL 37m nightly. She will start with 1/2 tab nightly x 1 week and come in for nurse visit within one week of starting the medication. -Patient to communicate when she starts the medication to PCP team to schedule appointment for follow up.  -Specific instructions were given by the PCP team to the patient, she voiced understanding of these instructions and was comfortable with them.  Recommended to continue current medication   Hyperlipidemia: (LDL goal < 70) -Uncontrolled -Current treatment: . Simvastatin 20 mg taking 1 tablet daily  -Current dietary patterns: eating a lot of seafood, baked and broiled. Eating a lot of vegetables.  -Current exercise habits: Doing water aerobics at least three days a week.  -[facility]  -Educated on Cholesterol goals;  Benefits of statin for ASCVD risk reduction; Importance of limiting foods high in cholesterol; -Recommended to continue current medication  Diabetes (A1c goal <7%) -controlled -Current medications: . Ozempic 1 mg - once a week on Saturday  . Tresiba 100 unit/ml - inject 10 units at night  -Current home glucose readings . fasting glucose: 100-110 . post prandial glucose: none reported during this visit. -Denies hypoglycemic/hyperglycemic symptoms -Current meal patterns:  . Breakfast: toast with a tbsp of peanut butter, croissant  . lunch: salad, ravioli and soup - small portions  . dinner: salad, ravioli, and soup - small portions  . snacks: Pop-Secret 100 calories per serving . drinks: green [place] diet tea, ocean spray zero calories, propel - black cherry  -Current exercise: patient reports that she is going to resume exercising at least 2-3 times per week using a set of exercises given to her by a therapist.  She also does 10 minutes of jogging in the water and 10 minutes afterward. She is usually in the water for at  least 40 minutes.  -Educated on A1c and blood sugar goals; Complications of diabetes including kidney damage, retinal damage, and cardiovascular disease; Benefits of weight loss; Carbohydrate counting and/or plate method -Counseled to check feet daily and get yearly eye exams -Counseled on the importance of checking blood sugar twice per day.  Recommended patient closely monitor the amount of carbs that she has with each serving.  Educated on the importance of drinking water and will be giving her a Nutrition in the fast lane hand out.   Health Maintenance: Patient reported that she was concerned about COVID testing for her surgery in June. I explained to the patient that this was out of my scope of practice but I would follow up with my CCM colleagues to see if they could help and if there are any available resources. She reports the last time she had COVID testing using transportation she had to go through drive  thru testing and did not have transportation for a while because she was reported for taking her mask off. This is very concerning for her and I reached out to [place] to help. Tillie Rung reports that she will be in contact with Ms. Carsten.    Patient Goals/Self-Care Activities . Over the next 90 days, patient will:  - take medications as prescribed target a minimum of 150 minutes of moderate intensity exercise weekly  Follow Up Plan:   The patient has been provided with contact information for the care management team and has been advised to call with any health related questions or concerns.       Medication Assistance: Ozempic obtained through [employer]  medication assistance program.  Enrollment ends 01/2021.  Patient's preferred pharmacy is:  [facility], [address] [place] [address] Phone: [phone] Fax: [place],  [address] [place] [address] Phone: [phone] Fax: [phone]  Uses pill box? Yes Pt endorses 85% compliance  We discussed: Benefits of medication synchronization, packaging and delivery as well as enhanced pharmacist oversight with [employer]. Patient decided to: Continue current medication management strategy  Care Plan and Follow Up Patient Decision:  Patient agrees to Care Plan and Follow-up.  Plan: The patient has been provided with contact information for the care management team and has been advised to call with any health related questions or concerns.   [name], PharmD Clinical Pharmacist [facility] [phone]

## 2020-07-05 NOTE — Telephone Encounter (Signed)
Faxed prescription for bedside potty to [facility].

## 2020-07-05 NOTE — Telephone Encounter (Signed)
Patient called to say that Roetta Sessions, PA-C, gave her a prescription for a bedside potty chair which she requested.  Patient has found someone who will take her insurance but they said that the prescription has to be faxed to them by [place].  Patient asked that we please fax the prescription to [facility] at [phone].

## 2020-07-05 NOTE — Chronic Care Management (AMB) (Signed)
Chronic Care Management    Social Work Note  07/05/2020 Name: Teresa Golden MRN: [id number] DOB: 21-Jun-1945  Teresa Golden is a 75 y.o. year old female who is a primary care patient of Glendale Chard, MD. The CCM team was consulted to assist the patient with chronic disease management and/or care coordination needs related to: Transportation Needs .   Engaged with patient by telephone for follow up visit in response to provider referral for social work chronic care management and care coordination services.   Consent to Services:  The patient was given information about Chronic Care Management services, agreed to services, and gave verbal consent prior to initiation of services.  Please see initial visit note for detailed documentation.   Patient agreed to services and consent obtained.   Assessment: Review of patient past medical history, allergies, medications, and health status, including review of relevant consultants reports was performed today as part of a comprehensive evaluation and provision of chronic care management and care coordination services.     SDOH (Social Determinants of Health) assessments and interventions performed:    Advanced Directives Status: Not addressed in this encounter.  CCM Care Plan  Allergies  Allergen Reactions  . Meloxicam Other (See Comments)    Fever; muscle aches; "flu-like" symptoms  . Other     Rose fever and hay fever     Outpatient Encounter Medications as of 07/05/2020  Medication Sig Note  . amLODipine (NORVASC) 5 MG tablet Take 1 tablet (5 mg total) by mouth daily.   . B Complex-C (B-COMPLEX WITH VITAMIN C) tablet Take 1 tablet by mouth daily.   . Biotin 10000 MCG TABS Take 1 tablet by mouth daily.   . carvedilol (COREG CR) 20 MG 24 hr capsule TAKE 1 CAPSULE DAILY   . cephALEXin (KEFLEX) 500 MG capsule Take 1 capsule (500 mg total) by mouth 4 (four) times daily for 3 days.   . Cholecalciferol 5000 units TABS Take 5,000 Units by mouth  daily.   . Colchicine 0.6 MG CAPS TAKE 1 CAPSULE DAILY AS    NEEDED 06/20/2020: Patient takes as needed  . Cyanocobalamin (VITAMIN B-12) 2500 MCG SUBL Take 2,500 mcg by mouth daily.   . diclofenac Sodium (VOLTAREN) 1 % GEL APPLY 2 GRAMS TO AFFECTED AREA(S) 4 TIMES DAILY AS NEEDED   . docusate sodium (COLACE) 100 MG capsule Take 1 capsule (100 mg total) by mouth 2 (two) times daily.   [name] enoxaparin (LOVENOX) 40 MG/0.4ML injection Inject 0.4 mLs (40 mg total) into the skin daily for 7 days. Start 24 hours after your surgery on 07/30/2020.   . gabapentin (NEURONTIN) 300 MG capsule Take 600 mg by mouth at bedtime. Pt taking 600 MG at bedtime   . glucose blood (ONETOUCH VERIO) test strip Use as instructed to check blood sugars 2 times per day dx: e11.65   . HYDROcodone-acetaminophen (NORCO) 5-325 MG tablet Take 1 tablet by mouth every 6 (six) hours as needed for up to 5 days for severe pain.   [name] insulin degludec (TRESIBA FLEXTOUCH) 100 UNIT/ML SOPN FlexTouch Pen Inject 0.09 mLs (9 Units total) into the skin daily at 10 pm. (Patient taking differently: Inject 8 Units into the skin daily at 10 pm.)   . losartan (COZAAR) 100 MG tablet Take 1 tablet (100 mg total) by mouth daily.   . metoprolol succinate (TOPROL-XL) 25 MG 24 hr tablet Take 1/2 tablet by mouth daily at bedtime  for 6 days and then 1 tablet  by mouth daily at bedtime (Patient taking differently: Take 1/2 tablet by mouth daily at bedtime  for 6 days and then 1 tablet by mouth daily at bedtime) 06/20/2020: Patient has not started   . Multiple Vitamins-Minerals (MULTIVITAMIN WITH MINERALS) tablet Take 1 tablet by mouth daily. Women's 50+   . naproxen sodium (ANAPROX) 220 MG tablet Take 440 mg by mouth 2 (two) times daily as needed (pain). 2 tablets every night at bedtime   . ondansetron (ZOFRAN) 4 MG tablet Take 1 tablet (4 mg total) by mouth every 8 (eight) hours as needed for nausea or vomiting.   Glory Rosebush Delica Lancets 13K MISC Use as instructed to  check blood sugars 2 times per day dx: e11.65   . OZEMPIC, 1 MG/DOSE, 2 MG/1.5ML SOPN INJECT INTO SKIN ONCE A WEEK (Patient taking differently: 1mg )   . simvastatin (ZOCOR) 10 MG tablet Take 1 tablet (10 mg total) by mouth daily.   [name] SYNTHROID 25 MCG tablet Take 0.5 tablet (12.5 mcg) by mouth on Mondays through Fridays, then take 1 tablet (25 mcg) by mouth on Saturdays and Sundays.    No facility-administered encounter medications on file as of 07/05/2020.    Patient Active Problem List   Diagnosis Date Noted  . Type 2 diabetes mellitus with stage 3 chronic kidney disease, with long-term current use of insulin ([place]) 11/29/2019  . Panniculitis 09/30/2019  . Back pain 09/30/2019  . Hepatoma ([place]) 01/21/2019  . Other cirrhosis of liver ([place]) 07/05/2018  . Hypertensive nephropathy 07/05/2018  . Chronic renal disease, stage III ([place]) 07/05/2018  . Chronic left shoulder pain 07/05/2018  . Hypothyroidism 02/06/2018  . Hepatitis C virus infection cured after antiviral drug therapy 02/04/2018  . Osteoarthritis of right knee 07/30/2017  . S/P laparoscopic sleeve gastrectomy July 2018 08/26/2016  . Preop cardiovascular exam 07/30/2016  . Dyspnea on exertion 07/30/2016  . Class 2 severe obesity due to excess calories with serious comorbidity and body mass index (BMI) of 39.0 to 39.9 in adult ([place]) 07/30/2016  . Bilateral lower extremity edema 07/30/2016    Conditions to be addressed/monitored: HTN, DMII and CKD Stage III; Transportation  Care Plan : Social Work Care Plan  Updates made by Daneen Schick since 07/05/2020 12:00 AM    Problem: Care Coordination     Long-Range Goal: Collaborate with RN Care Manager to perform appropriate assessments to assist with care coordination needs   Start Date: 03/02/2020  Expected End Date: 06/30/2020  Recent Progress: On track  Priority: Low  Note:   Current Barriers:  . Chronic conditions including DM II, CKD III, and HTN which put patient at increased  risk of hospitalization  Social Work Clinical Goal(s):  [name] Over the next 120 days, patient will work with SW to address concerns related to care coordination  Interventions: . 1:1 collaboration with Glendale Chard, MD regarding development and update of comprehensive plan of care as evidenced by provider attestation and co-signature . Inter-disciplinary care team collaboration (see longitudinal plan of care) . Collaboration with embedded PharmD [name] who reports patient is in need of assistance locating a COVID 19 testing site that is not drive up for pre-op needs . Successful outbound call placed to the patient to determine she does have an appointment on June 3rd at 10:00 am for drive up testing - patient is concerned with ability to attend this appointment due to restrictions with [facility] . Advised the patient SW would refer her to [employer] for  assistance . Collaboration with [facility] to request services for the patient  Patient Goals/Self-Care Activities Over the next 60 days, patient will:   - Patient will self administer medications as prescribed Patient will attend all scheduled provider appointments Patient will call provider office for new concerns or questions Contact SW as needed prior to next scheduled call  Follow up Plan: SW will follow up with patient by phone over the next 45 days       Follow Up Plan: SW will follow up with patient by phone over the next 45 days      Daneen Schick, BSW, CDP Social Worker, Certified Dementia Practitioner [place] [facility] [phone]  Total time spent performing care coordination and/or care management activities with the patient by phone or face to face = 22 minutes.

## 2020-07-05 NOTE — Patient Instructions (Signed)
You will be contacted by [employer]. Call me if you do not receive a pick up time.  Thank you!  Daneen Schick, BSW, CDP Social Worker, Certified Dementia Practitioner [place] [facility] [place] [facility] [phone]

## 2020-07-06 ENCOUNTER — Telehealth: Payer: Self-pay

## 2020-07-06 NOTE — Telephone Encounter (Signed)
Patient called to speak with [place] regarding her home health care.  Please call.

## 2020-07-06 NOTE — Telephone Encounter (Signed)
Teresa Golden called from [facility] regarding a referral they had received from [place] for [facility].  Teresa Golden said that unfortunately, their managed care team is full and will not be able to pick up Teresa Golden at this time.

## 2020-07-06 NOTE — Telephone Encounter (Signed)
  Care Management   Follow Up Note   07/06/2020 Name: Teresa Golden MRN: [id number] DOB: [birth year]   Referred by: Glendale Chard, MD Reason for referral : Chronic Care Management   SW received inbound call from the patient who reports she is interested in names of home health agencies who may service her area. Patient reports she has contacted [place] who does not have available staff at this time. SW provided the patient with [facility] and [facility]. Patient reports she will call to determine if these agencies are within network.  Follow Up Plan: SW will follow up with patient as previously scheduled. The patient is encouraged to contact SW as needed prior to next scheduled call.  Daneen Schick, BSW, CDP Social Worker, Certified Dementia Practitioner [place] [facility] [place] [facility] [phone]

## 2020-07-09 ENCOUNTER — Telehealth: Payer: Self-pay

## 2020-07-09 ENCOUNTER — Telehealth: Payer: Self-pay | Admitting: *Deleted

## 2020-07-09 NOTE — Telephone Encounter (Signed)
Called and spoke with the patient on (07/06/20) regarding the message below.  Informed the patient that we received a call from [place] with [facility] stating that their managed care team is full and will not be able to pick her up at this time.  Patient verbalized understanding and agreed.  Patient stated that she will give her PCP office a call.  There is a young lady there that she can talk with and she will see if she will help her to find [place].    Informed the patient to give [place] a call back to let us know if she was able to find Home Health.  Patient agreed.  Also informed the patient about her do postoperative blood thinners after surgery.  Patient stated that she was made aware by her PCP office of her having to take the postoperative blood thinners.  Informed the patient of the message from [facility] regarding the postoperative blood thinners.  Per Matthew,PA-C on (07/04/2020):Patient is scheduled for Panniculectomy with Dr. Marla Roe on 07/30/2020.  We are going to do postoperative blood thinners for her.  I called he today to let her know but she did not answer.   If you would not mind letting her know when you have a moment or if she calls back and they send you a telephone message that we would like her to start Lovenox blood thinner injections 24 hours after surgery.  I will call this into her pharmacy.  This will help decrease her risk of a blood clot after surgery.    Patient verbalized understanding and agreed.//AB/CMA

## 2020-07-09 NOTE — Telephone Encounter (Signed)
Faxed referral on (07/04/20) to [facility] for wound care after surgery (Panniculitis) for the patient.  Confirmation received and copy scanned into the chart.//AB/CMA

## 2020-07-09 NOTE — Telephone Encounter (Signed)
Called the patient on (07/06/20),and informed her of the message below.  Patient verbalized understanding.  She stated that she will give her PCP office a call and see if they can't help her find home health.//AB/CMA

## 2020-07-09 NOTE — Telephone Encounter (Signed)
Patient called to find out specifically what kind of medical care she will need at home after her surgery.  She is calling the home health companies covered by her insurance and needs to be able to tell them exactly what she will need.  Please call.

## 2020-07-09 NOTE — Telephone Encounter (Signed)
Teresa Golden will return call to the patient.//AB/CMA

## 2020-07-09 NOTE — Telephone Encounter (Signed)
Faxed referral to [facility] for Lovenox injection and post-op care after surgery.   Confirmation received and copy scanned into the chart.//AB/CMA

## 2020-07-10 ENCOUNTER — Encounter: Payer: Medicare HMO | Admitting: Plastic Surgery

## 2020-07-10 ENCOUNTER — Telehealth: Payer: Self-pay | Admitting: Plastic Surgery

## 2020-07-10 ENCOUNTER — Ambulatory Visit: Payer: Self-pay

## 2020-07-10 ENCOUNTER — Telehealth: Payer: Self-pay

## 2020-07-10 DIAGNOSIS — E1122 Type 2 diabetes mellitus with diabetic chronic kidney disease: Secondary | ICD-10-CM

## 2020-07-10 DIAGNOSIS — N183 Chronic kidney disease, stage 3 unspecified: Secondary | ICD-10-CM

## 2020-07-10 DIAGNOSIS — Z794 Long term (current) use of insulin: Secondary | ICD-10-CM

## 2020-07-10 DIAGNOSIS — I129 Hypertensive chronic kidney disease with stage 1 through stage 4 chronic kidney disease, or unspecified chronic kidney disease: Secondary | ICD-10-CM

## 2020-07-10 NOTE — Chronic Care Management (AMB) (Signed)
Chronic Care Management    Social Work Note  07/10/2020 Name: Teresa Golden MRN: [id number] DOB: [birth year]  Teresa Golden is a 75 y.o. year old female who is a primary care patient of Glendale Chard, MD. The CCM team was consulted to assist the patient with chronic disease management and/or care coordination needs related to: care coordination.   Engaged with patient by telephone for follow up visit in response to provider referral for social work chronic care management and care coordination services.   Consent to Services:  The patient was given information about Chronic Care Management services, agreed to services, and gave verbal consent prior to initiation of services.  Please see initial visit note for detailed documentation.   Patient agreed to services and consent obtained.   Assessment: Review of patient past medical history, allergies, medications, and health status, including review of relevant consultants reports was performed today as part of a comprehensive evaluation and provision of chronic care management and care coordination services.     SDOH (Social Determinants of Health) assessments and interventions performed:    Advanced Directives Status: Not addressed in this encounter.  CCM Care Plan  Allergies  Allergen Reactions  . Meloxicam Other (See Comments)    Fever; muscle aches; "flu-like" symptoms  . Other     Rose fever and hay fever     Outpatient Encounter Medications as of 07/10/2020  Medication Sig Note  . amLODipine (NORVASC) 5 MG tablet Take 1 tablet (5 mg total) by mouth daily.   . B Complex-C (B-COMPLEX WITH VITAMIN C) tablet Take 1 tablet by mouth daily.   . Biotin 10000 MCG TABS Take 1 tablet by mouth daily.   . carvedilol (COREG CR) 20 MG 24 hr capsule TAKE 1 CAPSULE DAILY   . Cholecalciferol 5000 units TABS Take 5,000 Units by mouth daily.   . Colchicine 0.6 MG CAPS TAKE 1 CAPSULE DAILY AS    NEEDED 06/20/2020: Patient takes as needed  .  Cyanocobalamin (VITAMIN B-12) 2500 MCG SUBL Take 2,500 mcg by mouth daily.   . diclofenac Sodium (VOLTAREN) 1 % GEL APPLY 2 GRAMS TO AFFECTED AREA(S) 4 TIMES DAILY AS NEEDED   . docusate sodium (COLACE) 100 MG capsule Take 1 capsule (100 mg total) by mouth 2 (two) times daily.   [name] enoxaparin (LOVENOX) 40 MG/0.4ML injection Inject 0.4 mLs (40 mg total) into the skin daily for 7 days. Start 24 hours after your surgery on 07/30/2020.   . gabapentin (NEURONTIN) 300 MG capsule Take 600 mg by mouth at bedtime. Pt taking 600 MG at bedtime   . glucose blood (ONETOUCH VERIO) test strip Use as instructed to check blood sugars 2 times per day dx: e11.65   . insulin degludec (TRESIBA FLEXTOUCH) 100 UNIT/ML SOPN FlexTouch Pen Inject 0.09 mLs (9 Units total) into the skin daily at 10 pm. (Patient taking differently: Inject 8 Units into the skin daily at 10 pm.)   . losartan (COZAAR) 100 MG tablet Take 1 tablet (100 mg total) by mouth daily.   . metoprolol succinate (TOPROL-XL) 25 MG 24 hr tablet Take 1/2 tablet by mouth daily at bedtime  for 6 days and then 1 tablet by mouth daily at bedtime (Patient taking differently: Take 1/2 tablet by mouth daily at bedtime  for 6 days and then 1 tablet by mouth daily at bedtime) 06/20/2020: Patient has not started   . Multiple Vitamins-Minerals (MULTIVITAMIN WITH MINERALS) tablet Take 1 tablet by mouth daily. Women's 50+   .  naproxen sodium (ANAPROX) 220 MG tablet Take 440 mg by mouth 2 (two) times daily as needed (pain). 2 tablets every night at bedtime   . ondansetron (ZOFRAN) 4 MG tablet Take 1 tablet (4 mg total) by mouth every 8 (eight) hours as needed for nausea or vomiting.   Glory Rosebush Delica Lancets 11B MISC Use as instructed to check blood sugars 2 times per day dx: e11.65   . OZEMPIC, 1 MG/DOSE, 2 MG/1.5ML SOPN INJECT INTO SKIN ONCE A WEEK (Patient taking differently: 1mg )   . simvastatin (ZOCOR) 10 MG tablet Take 1 tablet (10 mg total) by mouth daily.   [name] SYNTHROID 25  MCG tablet Take 0.5 tablet (12.5 mcg) by mouth on Mondays through Fridays, then take 1 tablet (25 mcg) by mouth on Saturdays and Sundays.    No facility-administered encounter medications on file as of 07/10/2020.    Patient Active Problem List   Diagnosis Date Noted  . Type 2 diabetes mellitus with stage 3 chronic kidney disease, with long-term current use of insulin ([place]) 11/29/2019  . Panniculitis 09/30/2019  . Back pain 09/30/2019  . Hepatoma ([place]) 01/21/2019  . Other cirrhosis of liver ([place]) 07/05/2018  . Hypertensive nephropathy 07/05/2018  . Chronic renal disease, stage III ([place]) 07/05/2018  . Chronic left shoulder pain 07/05/2018  . Hypothyroidism 02/06/2018  . Hepatitis C virus infection cured after antiviral drug therapy 02/04/2018  . Osteoarthritis of right knee 07/30/2017  . S/P laparoscopic sleeve gastrectomy July 2018 08/26/2016  . Preop cardiovascular exam 07/30/2016  . Dyspnea on exertion 07/30/2016  . Class 2 severe obesity due to excess calories with serious comorbidity and body mass index (BMI) of 39.0 to 39.9 in adult ([place]) 07/30/2016  . Bilateral lower extremity edema 07/30/2016    Conditions to be addressed/monitored: HTN, DMII and CKD Stage III;   Care Plan : Social Work Care Plan  Updates made by Daneen Schick since 07/10/2020 12:00 AM    Problem: Care Coordination     Long-Range Goal: Collaborate with RN Care Manager to perform appropriate assessments to assist with care coordination needs   Start Date: 03/02/2020  Expected End Date: 06/30/2020  Recent Progress: On track  Priority: Low  Note:   Current Barriers:  . Chronic conditions including DM II, CKD III, and HTN which put patient at increased risk of hospitalization  Social Work Clinical Goal(s):  [name] Over the next 120 days, patient will work with SW to address concerns related to care coordination  Interventions: . 1:1 collaboration with Glendale Chard, MD regarding development and update of  comprehensive plan of care as evidenced by provider attestation and co-signature . Inter-disciplinary care team collaboration (see longitudinal plan of care) . Inbound call received from the patient requesting assistance locating a home health agency to provide post-op care in the home . Determined the patient has contacted her health plan to determine in network providers - patient reports she has contacted [place] who does not have staff available at this time. The patient attempted to contact [facility] and is waiting on a return call . Patient reports she is anxious and worried she will not have the care she needs once she returns home . Discussed the patients surgical team should place a home health referral when services are needed to identify type of service needed and frequency . Provided verbal education on home health services including start of care guidelines and the reason orders can not be sent ahead of time . Advised the patient  SW would collaborate with RN Care Manager to determine appropriate next steps . Patient reports concern she may not be able to have transportation home post surgery due to inability to get in and out of a vehicle . Advised the patient she will have access to a discharge planner who can assist with arranging transportation if the patient is unable to safely transfer into a friends vehicle . Discussed the patient has prepared as much as possible by picking up post-op meds and dressings which will be used with wound care . Collaboration with RN Care Manager [place] to discuss need for patient to collaborate with her surgeons office regarding home health orders . Collaboration with Kirtland Bouchard, CMA requesting feedback regarding plans for home health orders  Patient Goals/Self-Care Activities Over the next 60 days, patient will:   - Patient will self administer medications as prescribed Patient will attend all scheduled provider appointments Patient will call  provider office for new concerns or questions Contact SW as needed prior to next scheduled call  Follow up Plan: SW will follow up with patient by phone over the next 7 days       Follow Up Plan: SW will follow up with patient by phone over the next 7 days      [place], CDP Social Worker, Certified Dementia Practitioner [place] [facility] [place] [facility] [phone]  Total time spent performing care coordination and/or care management activities with the patient by phone or face to face = 39 minutes.

## 2020-07-10 NOTE — Telephone Encounter (Signed)
Spoke with the patient

## 2020-07-10 NOTE — Telephone Encounter (Signed)
Received call from patient regarding update on available home health agencies that may can assist with post-operative care. Patient advised that [place] indicated we can fax orders to them at [phone] and they will review the orders for possible accommodation. I sent Software engineer a message with this information.  Patient also stated that her transportation person for the day of/day after surgery will be limited in their ability to help, since they just recently had neck surgery. She asked if there was a transportation service through [facility] that can take her home after surgery, based on a mention from her social Industrial/product designer. I advised that I will check with the providers on this option and let her know what I find out.   I inquired about any other help from close friends or family, and Ms. Santoyo indicated that she does not have family in the area and limited people who can be reliable for assistance. She stated that she "has a neighbor a few houses down that can be to her home within a few minutes." She mentioned that she "is trying to make sure arrangements are secured before surgery, but that she is anxious because of her limited help." I advised that I would relay the information and try to help as much as I can, and I will let her know the outcome as soon as I have a response from our providers. Patient expressed understanding and agreement.

## 2020-07-11 ENCOUNTER — Ambulatory Visit: Payer: Self-pay

## 2020-07-11 DIAGNOSIS — E1122 Type 2 diabetes mellitus with diabetic chronic kidney disease: Secondary | ICD-10-CM

## 2020-07-11 DIAGNOSIS — I129 Hypertensive chronic kidney disease with stage 1 through stage 4 chronic kidney disease, or unspecified chronic kidney disease: Secondary | ICD-10-CM

## 2020-07-11 DIAGNOSIS — Z794 Long term (current) use of insulin: Secondary | ICD-10-CM

## 2020-07-11 DIAGNOSIS — N183 Chronic kidney disease, stage 3 unspecified: Secondary | ICD-10-CM

## 2020-07-11 NOTE — Patient Instructions (Signed)
Social Worker Visit Information  Goals we discussed today:  Goals Addressed            This Visit's Progress   . Work with SW to manage care coordination needs       Timeframe:  Long-Range Goal Priority:  [employer] Start Date:   12.16.21                          Expected End Date:   3.16.22                    Next date of contact: 5.27.22  Patient Goals/Self-Care Activities Over the next 45 days patient will:   - Patient will self administer medications as prescribed -Patient will attend all scheduled provider appointments -Patient will call provider office for new concerns or questions -Contact SW as needed prior to next scheduled call         Follow Up Plan: SW will follow up with patient by phone over the next 10 days  Daneen Schick, BSW, CDP Social Worker, Certified Dementia Practitioner [place] [facility] [place] [facility] [phone]

## 2020-07-11 NOTE — Chronic Care Management (AMB) (Signed)
Chronic Care Management    Social Work Note  07/11/2020 Name: Teresa Golden MRN: [id number] DOB: October 05, 1945  Teresa Golden is a 75 y.o. year old female who is a primary care patient of Glendale Chard, MD. The CCM team was consulted to assist the patient with chronic disease management and/or care coordination needs related to: care coordination.   Engaged with patient by telephone for follow up visit in response to provider referral for social work chronic care management and care coordination services.   Consent to Services:  The patient was given information about Chronic Care Management services, agreed to services, and gave verbal consent prior to initiation of services.  Please see initial visit note for detailed documentation.   Patient agreed to services and consent obtained.   Assessment: Review of patient past medical history, allergies, medications, and health status, including review of relevant consultants reports was performed today as part of a comprehensive evaluation and provision of chronic care management and care coordination services.     SDOH (Social Determinants of Health) assessments and interventions performed:    Advanced Directives Status: Not addressed in this encounter.  CCM Care Plan  Allergies  Allergen Reactions  . Meloxicam Other (See Comments)    Fever; muscle aches; "flu-like" symptoms  . Other     Rose fever and hay fever     Outpatient Encounter Medications as of 07/11/2020  Medication Sig Note  . amLODipine (NORVASC) 5 MG tablet Take 1 tablet (5 mg total) by mouth daily.   . B Complex-C (B-COMPLEX WITH VITAMIN C) tablet Take 1 tablet by mouth daily.   . Biotin 10000 MCG TABS Take 1 tablet by mouth daily.   . carvedilol (COREG CR) 20 MG 24 hr capsule TAKE 1 CAPSULE DAILY   . Cholecalciferol 5000 units TABS Take 5,000 Units by mouth daily.   . Colchicine 0.6 MG CAPS TAKE 1 CAPSULE DAILY AS    NEEDED 06/20/2020: Patient takes as needed  .  Cyanocobalamin (VITAMIN B-12) 2500 MCG SUBL Take 2,500 mcg by mouth daily.   . diclofenac Sodium (VOLTAREN) 1 % GEL APPLY 2 GRAMS TO AFFECTED AREA(S) 4 TIMES DAILY AS NEEDED   . docusate sodium (COLACE) 100 MG capsule Take 1 capsule (100 mg total) by mouth 2 (two) times daily.   [name] enoxaparin (LOVENOX) 40 MG/0.4ML injection Inject 0.4 mLs (40 mg total) into the skin daily for 7 days. Start 24 hours after your surgery on 07/30/2020.   . gabapentin (NEURONTIN) 300 MG capsule Take 600 mg by mouth at bedtime. Pt taking 600 MG at bedtime   . glucose blood (ONETOUCH VERIO) test strip Use as instructed to check blood sugars 2 times per day dx: e11.65   . insulin degludec (TRESIBA FLEXTOUCH) 100 UNIT/ML SOPN FlexTouch Pen Inject 0.09 mLs (9 Units total) into the skin daily at 10 pm. (Patient taking differently: Inject 8 Units into the skin daily at 10 pm.)   . losartan (COZAAR) 100 MG tablet Take 1 tablet (100 mg total) by mouth daily.   . metoprolol succinate (TOPROL-XL) 25 MG 24 hr tablet Take 1/2 tablet by mouth daily at bedtime  for 6 days and then 1 tablet by mouth daily at bedtime (Patient taking differently: Take 1/2 tablet by mouth daily at bedtime  for 6 days and then 1 tablet by mouth daily at bedtime) 06/20/2020: Patient has not started   . Multiple Vitamins-Minerals (MULTIVITAMIN WITH MINERALS) tablet Take 1 tablet by mouth daily. Women's 50+   .  naproxen sodium (ANAPROX) 220 MG tablet Take 440 mg by mouth 2 (two) times daily as needed (pain). 2 tablets every night at bedtime   . ondansetron (ZOFRAN) 4 MG tablet Take 1 tablet (4 mg total) by mouth every 8 (eight) hours as needed for nausea or vomiting.   Glory Rosebush Delica Lancets 16W MISC Use as instructed to check blood sugars 2 times per day dx: e11.65   . OZEMPIC, 1 MG/DOSE, 2 MG/1.5ML SOPN INJECT INTO SKIN ONCE A WEEK (Patient taking differently: 1mg )   . simvastatin (ZOCOR) 10 MG tablet Take 1 tablet (10 mg total) by mouth daily.   [name] SYNTHROID 25  MCG tablet Take 0.5 tablet (12.5 mcg) by mouth on Mondays through Fridays, then take 1 tablet (25 mcg) by mouth on Saturdays and Sundays.    No facility-administered encounter medications on file as of 07/11/2020.    Patient Active Problem List   Diagnosis Date Noted  . Type 2 diabetes mellitus with stage 3 chronic kidney disease, with long-term current use of insulin ([place]) 11/29/2019  . Panniculitis 09/30/2019  . Back pain 09/30/2019  . Hepatoma ([place]) 01/21/2019  . Other cirrhosis of liver ([place]) 07/05/2018  . Hypertensive nephropathy 07/05/2018  . Chronic renal disease, stage III ([place]) 07/05/2018  . Chronic left shoulder pain 07/05/2018  . Hypothyroidism 02/06/2018  . Hepatitis C virus infection cured after antiviral drug therapy 02/04/2018  . Osteoarthritis of right knee 07/30/2017  . S/P laparoscopic sleeve gastrectomy July 2018 08/26/2016  . Preop cardiovascular exam 07/30/2016  . Dyspnea on exertion 07/30/2016  . Class 2 severe obesity due to excess calories with serious comorbidity and body mass index (BMI) of 39.0 to 39.9 in adult ([place]) 07/30/2016  . Bilateral lower extremity edema 07/30/2016    Conditions to be addressed/monitored: HTN, DMII and CKD Stage III; care coordination  Care Plan : Social Work Care Plan  Updates made by Daneen Schick since 07/11/2020 12:00 AM    Problem: Care Coordination     Long-Range Goal: Collaborate with RN Care Manager to perform appropriate assessments to assist with care coordination needs   Start Date: 03/02/2020  Expected End Date: 06/30/2020  Recent Progress: On track  Priority: Low  Note:   Current Barriers:  . Chronic conditions including DM II, CKD III, and HTN which put patient at increased risk of hospitalization  Social Work Clinical Goal(s):  [name] Over the next 120 days, patient will work with SW to address concerns related to care coordination  Interventions: . 1:1 collaboration with Glendale Chard, MD regarding development and  update of comprehensive plan of care as evidenced by provider attestation and co-signature . Inter-disciplinary care team collaboration (see longitudinal plan of care) . Communication received from Melene Plan, [place] who indicates the patients surgeons office will complete home health orders for post -op care . Inbound call from the patient - SW advised the patient of communication received by Melene Plan CMA . Determined the patient was contacted by Dr. Marla Roe the evening of 5/17 to discuss post-op care needs in the home . Discussed the patients support person who had originally planned to stay with the patient in the home will no longer be available until 6/12 due to an oversight with scheduling . The patient reports Dr. Marla Roe plans to look into insurance authorizing an over night provider versus rescheduling the procedure . Advised the patient SW would contact her next week to determine outcome . Collaboration with RN Care Manager to advise of interventions  and plan  Patient Goals/Self-Care Activities Over the next 60 days, patient will:   - Patient will self administer medications as prescribed Patient will attend all scheduled provider appointments Patient will call provider office for new concerns or questions Contact SW as needed prior to next scheduled call  Follow up Plan: SW will follow up with patient by phone over the next 10 days       Follow Up Plan: SW will follow up with patient by phone over the next 10 days      Pierce, CDP Social Worker, Certified Dementia Practitioner [facility] [phone]  Total time spent performing care coordination and/or care management activities with the patient by phone or face to face = 23 minutes.

## 2020-07-12 ENCOUNTER — Ambulatory Visit
Admission: RE | Admit: 2020-07-12 | Discharge: 2020-07-12 | Disposition: A | Discharge status: Home / Self Care | Payer: Medicare HMO | Source: Ambulatory Visit | Attending: Nurse Practitioner | Admitting: Nurse Practitioner

## 2020-07-12 DIAGNOSIS — K7460 Unspecified cirrhosis of liver: Secondary | ICD-10-CM

## 2020-07-13 ENCOUNTER — Ambulatory Visit: Payer: Medicare HMO | Admitting: Internal Medicine

## 2020-07-17 ENCOUNTER — Ambulatory Visit: Payer: Medicare HMO

## 2020-07-17 ENCOUNTER — Telehealth: Payer: Self-pay

## 2020-07-17 ENCOUNTER — Other Ambulatory Visit: Payer: Self-pay

## 2020-07-17 VITALS — BP 132/72 | HR 82 | Temp 97.8°F | Ht 60.2 in | Wt 215.4 lb

## 2020-07-17 DIAGNOSIS — I129 Hypertensive chronic kidney disease with stage 1 through stage 4 chronic kidney disease, or unspecified chronic kidney disease: Secondary | ICD-10-CM

## 2020-07-17 NOTE — Patient Instructions (Addendum)
Visit Information It was great speaking with you today!  Please let me know if you have any questions about our visit.  Goals Addressed            This Visit's Progress   . Manage My Medicine       Timeframe:  Long-Range Goal Priority:  High Start Date:    04/11/2020                         Expected End Date:                       Follow Up Date 07/2020   - call for medicine refill 2 or 3 days before it runs out - call if I am sick and can't take my medicine - keep a list of all the medicines I take; vitamins and herbals too - use an alarm clock or phone to remind me to take my medicine    Why is this important?   . These steps will help you keep on track with your medicines.          Patient Care Plan: CCM Pharmacy Care Plan    Problem Identified: HTN, HLD, DM II   Priority: High    Long-Range Goal: Disease Management   Start Date: 04/11/2020  Recent Progress: On track  Priority: High  Note:     Current Barriers:  . Does not adhere to prescribed medication regimen   Pharmacist Clinical Goal(s):  [name] Over the next 90 days, patient will achieve adherence to monitoring guidelines and medication adherence to achieve therapeutic efficacy through collaboration with PharmD and provider.    Interventions: . 1:1 collaboration with Glendale Chard, MD regarding development and update of comprehensive plan of care as evidenced by provider attestation and co-signature . Inter-disciplinary care team collaboration (see longitudinal plan of care) . Comprehensive medication review performed; medication list updated in electronic medical record   Hypertension (BP goal <130/80) -Uncontrolled -Current treatment: o Metoprolol Succinate 25 mg tablet taking 1 tablet by mouth daily.  . Losartan 100 mg tablet daily -Medications previously tried:  -Current home readings: she does not have access to them at this time because she is not at home, she reports they have been in a normal  range. -Denies hypotensive/hypertensive symptoms -Educated on BP goals and benefits of medications for prevention of heart attack, stroke and kidney damage; Daily salt intake goal < 2300 mg; Exercise goal of 150 minutes per week; Importance of home blood pressure monitoring; -Counseled to monitor BP at home at least once per day, document, and provide log at future appointments -Counseled on diet and exercise extensively -Instructions as follows:Toprol XL 25mg  nightly. She will start with 1/2 tab nightly x 1 week and come in for nurse visit within one week of starting the medication. -Patient to communicate when she starts the medication to PCP team to schedule appointment for follow up.  -Specific instructions were given by the PCP team to the patient, she voiced understanding of these instructions and was comfortable with them.  Recommended to continue current medication   Hyperlipidemia: (LDL goal < 70) -Uncontrolled -Current treatment: . Simvastatin 20 mg taking 1 tablet daily  -Current dietary patterns: eating a lot of seafood, baked and broiled. Eating a lot of vegetables.  -Current exercise habits: Doing water aerobics at least three days a week.  -[facility]  -Educated on Cholesterol goals;  Benefits of statin for ASCVD risk reduction; Importance of limiting foods high in cholesterol; -Recommended to continue current medication  Diabetes (A1c goal <7%) -controlled -Current medications: . Ozempic 1 mg - once a week on Saturday  . Tresiba 100 unit/ml - inject 10 units at night  -Current home glucose readings . fasting glucose: 100-110 . post prandial glucose: none reported during this visit. -Denies hypoglycemic/hyperglycemic symptoms -Current meal patterns:  . Breakfast: toast with a tbsp of peanut butter, croissant  . lunch: salad, ravioli and soup - small portions  . dinner: salad, ravioli, and soup - small portions  . snacks:  Pop-Secret 100 calories per serving . drinks: green [place] diet tea, ocean spray zero calories, propel - black cherry  -Current exercise: patient reports that she is going to resume exercising at least 2-3 times per week using a set of exercises given to her by a therapist.  She also does 10 minutes of jogging in the water and 10 minutes afterward. She is usually in the water for at least 40 minutes.  -Educated on A1c and blood sugar goals; Complications of diabetes including kidney damage, retinal damage, and cardiovascular disease; Benefits of weight loss; Carbohydrate counting and/or plate method -Counseled to check feet daily and get yearly eye exams -Counseled on the importance of checking blood sugar twice per day.  Recommended patient closely monitor the amount of carbs that she has with each serving.  Educated on the importance of drinking water and will be giving her a Nutrition in the fast lane hand out.   Health Maintenance:  Patient reported that she was concerned about COVID testing for her surgery in June. I explained to the patient that this was out of my scope of practice but I would follow up with my CCM colleagues to see if they could help and if there are any available resources. She reports the last time she had COVID testing using transportation she had to go through drive thru testing and did not have transportation for a while because she was reported for taking her mask off. This is very concerning for her and I reached out to [place] to help. Tillie Rung reports that she will be in contact with Ms. Heyward.    Patient Goals/Self-Care Activities . Over the next 90 days, patient will:  - take medications as prescribed target a minimum of 150 minutes of moderate intensity exercise weekly  Follow Up Plan:   The patient has been provided with contact information for the care management team and has been advised to call with any health related questions or concerns.         Patient agreed to services and verbal consent obtained.   The patient verbalized understanding of instructions, educational materials, and care plan provided today and agreed to receive a mailed copy of patient instructions, educational materials, and care plan.   [name], PharmD Clinical Pharmacist [facility] [phone]

## 2020-07-17 NOTE — Telephone Encounter (Signed)
Patient called requesting call back with an update on home health and if that has been set up. Please call her on her mobile: [phone].

## 2020-07-17 NOTE — Progress Notes (Signed)
Patient is here for bp check. Provider notified. Patient will follow up with provider.   BP Readings from Last 3 Encounters:  07/17/20 132/72  07/03/20 (!) 178/83  06/20/20 (!) 147/74

## 2020-07-17 NOTE — Telephone Encounter (Signed)
I spoke with Shayna regarding the update on [name].  Frances Furbish stated that she will be calling the patient and updating her regarding HH.//AB/CMA

## 2020-07-18 ENCOUNTER — Telehealth: Payer: Self-pay

## 2020-07-18 ENCOUNTER — Telehealth: Payer: Self-pay | Admitting: *Deleted

## 2020-07-18 NOTE — Telephone Encounter (Signed)
Faxed Orders,last office notes,demographics, and insurance information to the [place] with [facility].  Confirmation received.  Copy scanned into the chart.//AB/CMA

## 2020-07-18 NOTE — Telephone Encounter (Signed)
  Care Management   Follow Up Note   07/18/2020 Name: Teresa Golden MRN: [id number] DOB: 05/04/1945   Referred by: Glendale Chard, MD Reason for referral : Chronic Care Management   SW placed an unsuccessful outbound call to the patient in a response to a voice message received. HIPAA compliant voice message requesting a return call.  Follow Up Plan: SW will follow up with the patient as previously scheduled.  Daneen Schick, BSW, CDP Social Worker, Certified Dementia Practitioner [place] [facility] [facility] [phone]

## 2020-07-18 NOTE — Telephone Encounter (Signed)
Called and spoke with Teresa Golden with [facility] regarding referral for post-op recovery care after surgery.  Teresa Golden asked if I would fax over [place] office notes, and patient's demographics.  They will look it over.//AB/CMA

## 2020-07-18 NOTE — Telephone Encounter (Signed)
Faxed referral order form on (07/16/20) to [facility] for post-op recovery assistance for the patient for after surgery.  Confirmation received.  Copy scanned into the chart.//AB/CMA

## 2020-07-18 NOTE — Telephone Encounter (Signed)
Received on (07/13/20) a call from [facility] with [facility] to let us know that they we will not be able to take the patient on for after surgery care.    She asked me to give Teresa Golden a call.  They do take the patient's insurance.//AB/CMA

## 2020-07-19 ENCOUNTER — Ambulatory Visit (INDEPENDENT_AMBULATORY_CARE_PROVIDER_SITE_OTHER): Payer: Medicare HMO | Admitting: Addiction (Substance Use Disorder)

## 2020-07-19 ENCOUNTER — Other Ambulatory Visit: Payer: Self-pay

## 2020-07-19 DIAGNOSIS — F4323 Adjustment disorder with mixed anxiety and depressed mood: Secondary | ICD-10-CM

## 2020-07-19 NOTE — Progress Notes (Signed)
      [facility] Counselor/Therapist Progress Note  [name], MRN: [id number],    Date: 07/19/2020   Time Spent:  79mins  Treatment Type: Individual Therapy  Reported Symptoms:  Concerned about surgery   Mental Status Exam:  Appearance:   Well Groomed     Behavior:  Appropriate and Sharing  Motor:  Normal  Speech/Language:   Clear and Coherent  Affect:  Appropriate  Mood:  anxious  Thought process:  normal  Thought content:    Rumination  Sensory/Perceptual disturbances:    WNL  Orientation:  x4  Attention:  Good  Concentration:  Good  Memory:  WNL  Fund of knowledge:   Good  Insight:    Good  Judgment:   Good  Impulse Control:  Good   Risk Assessment: Danger to Self:  No Self-injurious Behavior: No Danger to Others: No Duty to Warn:no Physical Aggression / Violence:No  Access to Firearms a concern: No  Gang Involvement:No   Subjective: Client reported struggling with worrying and not "giving her cares to God". Client leaning into prayer after finding out that no agencies had aids to come help her recover from her surgery. Client concerned it would be pushed out again and is looking to get it and work to recover fully, not delaying. Therapist used MI & CBT with client to support her as she processed the frustrations and everything she had thought to try, along with validating her worries and frustrations. Therapist also worked with client to help her challenge self-defeating thoughts that make her feel alone and unsupported. Cleint made progress identifying a few friends who cheer her up and treasure a freindship with her, and may be able to come by and help her out post op. Therapist assessed for stability and client denied SI/HI/AVH.   Interventions: Cognitive Behavioral Therapy, Motivational Interviewing and RPT   Diagnosis:   ICD-10-CM   1. Adjustment disorder with mixed anxiety and depressed mood  F43.23      Plan of Care:  Client is to return  to therapy with therapist every 1-2 weeks as needed to process traumas/ frustrations in a safe space, to be re-evaluated in 3 months.  Client is to practice mindfulness AEB daily meditation and body scans or as needed when flooded by emotion/pain.  Client is to learn/practice DBT wise mind & radical acceptance. Client is to practice self-compassion AEB being gentle with themselves, utilizing self-care techniques daily or as needed when grieving something in the moment.  Client is to process grief/pain of their loss in a somatic body-felt sense way: ie using mindfulness, brainspotting, or trauma release as a method for releasing body pain/tension caused by grief.  Barnie Del, LCSW, LCAS, CCTP, CCS-I, BSP

## 2020-07-20 ENCOUNTER — Ambulatory Visit: Payer: Medicare HMO

## 2020-07-20 DIAGNOSIS — E1122 Type 2 diabetes mellitus with diabetic chronic kidney disease: Secondary | ICD-10-CM

## 2020-07-20 DIAGNOSIS — N1831 Chronic kidney disease, stage 3a: Secondary | ICD-10-CM

## 2020-07-20 DIAGNOSIS — N183 Chronic kidney disease, stage 3 unspecified: Secondary | ICD-10-CM | POA: Diagnosis not present

## 2020-07-20 DIAGNOSIS — I129 Hypertensive chronic kidney disease with stage 1 through stage 4 chronic kidney disease, or unspecified chronic kidney disease: Secondary | ICD-10-CM

## 2020-07-20 DIAGNOSIS — Z794 Long term (current) use of insulin: Secondary | ICD-10-CM

## 2020-07-20 NOTE — Chronic Care Management (AMB) (Signed)
Chronic Care Management    Social Work Note  07/20/2020 Name: Teresa Golden MRN: [id number] DOB: 08/05/1945  KAMEA DACOSTA is a 75 y.o. year old female who is a primary care patient of Glendale Chard, MD. The CCM team was consulted to assist the patient with chronic disease management and/or care coordination needs related to: [employer] .   Engaged with patient by telephone for follow up visit in response to provider referral for social work chronic care management and care coordination services.   Consent to Services:  The patient was given information about Chronic Care Management services, agreed to services, and gave verbal consent prior to initiation of services.  Please see initial visit note for detailed documentation.   Patient agreed to services and consent obtained.   Assessment: Review of patient past medical history, allergies, medications, and health status, including review of relevant consultants reports was performed today as part of a comprehensive evaluation and provision of chronic care management and care coordination services.     SDOH (Social Determinants of Health) assessments and interventions performed:    Advanced Directives Status: Not addressed in this encounter.  CCM Care Plan  Allergies  Allergen Reactions  . Meloxicam Other (See Comments)    Fever; muscle aches; "flu-like" symptoms  . Other     Rose fever and hay fever     Outpatient Encounter Medications as of 07/20/2020  Medication Sig Note  . amLODipine (NORVASC) 5 MG tablet Take 1 tablet (5 mg total) by mouth daily.   . B Complex-C (B-COMPLEX WITH VITAMIN C) tablet Take 1 tablet by mouth daily.   . Biotin 10000 MCG TABS Take 10,000 mcg by mouth daily.   . Cholecalciferol 5000 units TABS Take 5,000 Units by mouth daily.   . Colchicine 0.6 MG CAPS TAKE 1 CAPSULE DAILY AS    NEEDED (Patient taking differently: Take 0.6 mg by mouth daily as needed (gout).) 06/20/2020: Patient takes as needed   . Cranberry-Vitamin C 250-60 MG CAPS Take 1 tablet by mouth daily.   . diclofenac Sodium (VOLTAREN) 1 % GEL APPLY 2 GRAMS TO AFFECTED AREA(S) 4 TIMES DAILY AS NEEDED (Patient taking differently: Apply 2 g topically 4 (four) times daily as needed (pain).)   . enoxaparin (LOVENOX) 40 MG/0.4ML injection Inject 40 mg into the skin daily. Taking for 7 days after procedure 07/19/2020: After procedure  . gabapentin (NEURONTIN) 300 MG capsule Take 600 mg by mouth at bedtime. Pt taking 600 MG at bedtime   . glucose blood (ONETOUCH VERIO) test strip Use as instructed to check blood sugars 2 times per day dx: e11.65   . insulin degludec (TRESIBA FLEXTOUCH) 100 UNIT/ML SOPN FlexTouch Pen Inject 0.09 mLs (9 Units total) into the skin daily at 10 pm. (Patient taking differently: Inject 12 Units into the skin daily at 10 pm.)   . losartan (COZAAR) 100 MG tablet Take 1 tablet (100 mg total) by mouth daily.   . metoprolol succinate (TOPROL-XL) 25 MG 24 hr tablet Take 1/2 tablet by mouth daily at bedtime  for 6 days and then 1 tablet by mouth daily at bedtime (Patient taking differently: Take 25 mg by mouth at bedtime. Take 1 tablet by mouth daily at bedtime) 07/17/2020: Taking   . Multiple Vitamins-Minerals (MULTIVITAMIN WITH MINERALS) tablet Take 1 tablet by mouth daily. Women's 50+   . naproxen sodium (ANAPROX) 220 MG tablet Take 440 mg by mouth at bedtime. 2 tablets every night at bedtime   .  ondansetron (ZOFRAN) 4 MG tablet Take 1 tablet (4 mg total) by mouth every 8 (eight) hours as needed for nausea or vomiting. 07/19/2020: For after procedure   . OneTouch Delica Lancets 75Z MISC Use as instructed to check blood sugars 2 times per day dx: e11.65   . OZEMPIC, 1 MG/DOSE, 2 MG/1.5ML SOPN INJECT INTO SKIN ONCE A WEEK (Patient taking differently: Inject 1 mg into the skin every Saturday. 1mg )   . simvastatin (ZOCOR) 10 MG tablet Take 1 tablet (10 mg total) by mouth daily.   [name] SYNTHROID 25 MCG tablet Take 0.5 tablet  (12.5 mcg) by mouth on Mondays through Fridays, then take 1 tablet (25 mcg) by mouth on Saturdays and Sundays. (Patient taking differently: Take 12.5-25 mcg by mouth See admin instructions. Take 0.5 tablet (12.5 mcg) by mouth on Mondays through Fridays, then take 1 tablet (25 mcg) by mouth on Saturdays and Sundays.)    No facility-administered encounter medications on file as of 07/20/2020.    Patient Active Problem List   Diagnosis Date Noted  . Type 2 diabetes mellitus with stage 3 chronic kidney disease, with long-term current use of insulin ([place]) 11/29/2019  . Panniculitis 09/30/2019  . Back pain 09/30/2019  . Hepatoma ([place]) 01/21/2019  . Other cirrhosis of liver ([place]) 07/05/2018  . Hypertensive nephropathy 07/05/2018  . Chronic renal disease, stage III ([place]) 07/05/2018  . Chronic left shoulder pain 07/05/2018  . Hypothyroidism 02/06/2018  . Hepatitis C virus infection cured after antiviral drug therapy 02/04/2018  . Osteoarthritis of right knee 07/30/2017  . S/P laparoscopic sleeve gastrectomy July 2018 08/26/2016  . Preop cardiovascular exam 07/30/2016  . Dyspnea on exertion 07/30/2016  . Class 2 severe obesity due to excess calories with serious comorbidity and body mass index (BMI) of 39.0 to 39.9 in adult ([place]) 07/30/2016  . Bilateral lower extremity edema 07/30/2016    Conditions to be addressed/monitored: HTN, DMII and CKD Stage III; Care Coordination  Care Plan : Social Work Care Plan  Updates made by Daneen Schick since 07/20/2020 12:00 AM    Problem: Care Coordination     Long-Range Goal: Collaborate with RN Care Manager to perform appropriate assessments to assist with care coordination needs   Start Date: 03/02/2020  Expected End Date: 06/30/2020  This Visit's Progress: On track  Recent Progress: On track  Priority: Low  Note:   Current Barriers:  . Chronic conditions including DM II, CKD III, and HTN which put patient at increased risk of hospitalization  Social  Work Clinical Goal(s):  [name] Over the next 120 days, patient will work with SW to address concerns related to care coordination  Interventions: . 1:1 collaboration with Glendale Chard, MD regarding development and update of comprehensive plan of care as evidenced by provider attestation and co-signature . Inter-disciplinary care team collaboration (see longitudinal plan of care) . Successful outbound call placed to the patient to assess goal progression . Discussed the patient is still waiting to identify which home health agency will assist with in home care after scheduled surgery on June 6th . The patient reports her surgeons office has reached out to [facility], [facility], and [facility]. Both [facility] and [facility] have turned down referral . Determined the patient has a pre-op appointment on June 1 at the hospital to be cleared for surgery- she plans to ask to speak with a Education officer, museum during that appointment if she does not have a home health agency secured by that date . Discussed the patient will  postpone the surgery if she is unable to secure a home health agency . Advised the patient there are home health liaisons within the hospital who may be able to review patient orders and assist with securing post-operative care if needed . Discussed plans for SW to follow up with the patient on 6.13  . Encouraged the patient to contact SW prior to next scheduled appointment as needed  Patient Goals/Self-Care Activities Over the next 60 days, patient will:   - Patient will self administer medications as prescribed Patient will attend all scheduled provider appointments Patient will call provider office for new concerns or questions Contact SW as needed prior to next scheduled call  Follow up Plan: SW will follow up with patient by phone over the next 21 days       Follow Up Plan: SW will follow up with patient by phone over the next 21 days.      Daneen Schick, BSW, CDP Social Worker, Certified  Dementia Practitioner [place] [facility] [facility] [phone]  Total time spent performing care coordination and/or care management activities with the patient by phone or face to face = 23 minutes.

## 2020-07-20 NOTE — Patient Instructions (Signed)
Social Worker Visit Information  Goals we discussed today:  Goals Addressed            This Visit's Progress   . Work with SW to manage care coordination needs       Timeframe:  Long-Range Goal Priority:  [employer] Start Date:   12.16.21                          Expected End Date:   3.16.22                    Next date of contact: 6.13.22  Patient Goals/Self-Care Activities Over the next 45 days patient will:   - Patient will self administer medications as prescribed -Patient will attend all scheduled provider appointments -Patient will call provider office for new concerns or questions -Contact SW as needed prior to next scheduled call        Materials Provided: Verbal education about home health orders provided by phone  Follow Up Plan: SW will follow up with patient by phone over the next 21 days   Daneen Schick, BSW, CDP Social Worker, Certified Dementia Practitioner [place] [facility] [place] [facility] [phone]

## 2020-07-24 ENCOUNTER — Telehealth: Payer: Self-pay | Admitting: Plastic Surgery

## 2020-07-24 ENCOUNTER — Encounter: Payer: Medicare HMO | Admitting: Surgical

## 2020-07-24 ENCOUNTER — Ambulatory Visit: Payer: Medicare HMO

## 2020-07-24 DIAGNOSIS — Z794 Long term (current) use of insulin: Secondary | ICD-10-CM

## 2020-07-24 DIAGNOSIS — N183 Chronic kidney disease, stage 3 unspecified: Secondary | ICD-10-CM

## 2020-07-24 DIAGNOSIS — I129 Hypertensive chronic kidney disease with stage 1 through stage 4 chronic kidney disease, or unspecified chronic kidney disease: Secondary | ICD-10-CM

## 2020-07-24 DIAGNOSIS — E1122 Type 2 diabetes mellitus with diabetic chronic kidney disease: Secondary | ICD-10-CM

## 2020-07-24 NOTE — Chronic Care Management (AMB) (Signed)
Chronic Care Management    Social Work Note  07/24/2020 Name: Teresa Golden MRN: [id number] DOB: May 02, 1945  Teresa Golden is a 75 y.o. year old female who is a primary care patient of Glendale Chard, MD. The CCM team was consulted to assist the patient with chronic disease management and/or care coordination needs related to: [employer] .   Engaged with patient by telephone for follow up visit in response to provider referral for social work chronic care management and care coordination services.   Consent to Services:  The patient was given information about Chronic Care Management services, agreed to services, and gave verbal consent prior to initiation of services.  Please see initial visit note for detailed documentation.   Patient agreed to services and consent obtained.   Assessment: Review of patient past medical history, allergies, medications, and health status, including review of relevant consultants reports was performed today as part of a comprehensive evaluation and provision of chronic care management and care coordination services.     SDOH (Social Determinants of Health) assessments and interventions performed:    Advanced Directives Status: Not addressed in this encounter.  CCM Care Plan  Allergies  Allergen Reactions  . Meloxicam Other (See Comments)    Fever; muscle aches; "flu-like" symptoms  . Other     Rose fever and hay fever     Outpatient Encounter Medications as of 07/24/2020  Medication Sig Note  . amLODipine (NORVASC) 5 MG tablet Take 1 tablet (5 mg total) by mouth daily.   . B Complex-C (B-COMPLEX WITH VITAMIN C) tablet Take 1 tablet by mouth daily.   . Biotin 10000 MCG TABS Take 10,000 mcg by mouth daily.   . Cholecalciferol 5000 units TABS Take 5,000 Units by mouth daily.   . Colchicine 0.6 MG CAPS TAKE 1 CAPSULE DAILY AS    NEEDED (Patient taking differently: Take 0.6 mg by mouth daily as needed (gout).) 06/20/2020: Patient takes as needed   . Cranberry-Vitamin C 250-60 MG CAPS Take 1 tablet by mouth daily.   . diclofenac Sodium (VOLTAREN) 1 % GEL APPLY 2 GRAMS TO AFFECTED AREA(S) 4 TIMES DAILY AS NEEDED (Patient taking differently: Apply 2 g topically 4 (four) times daily as needed (pain).)   . enoxaparin (LOVENOX) 40 MG/0.4ML injection Inject 40 mg into the skin daily. Taking for 7 days after procedure 07/19/2020: After procedure  . gabapentin (NEURONTIN) 300 MG capsule Take 600 mg by mouth at bedtime. Pt taking 600 MG at bedtime   . glucose blood (ONETOUCH VERIO) test strip Use as instructed to check blood sugars 2 times per day dx: e11.65   . insulin degludec (TRESIBA FLEXTOUCH) 100 UNIT/ML SOPN FlexTouch Pen Inject 0.09 mLs (9 Units total) into the skin daily at 10 pm. (Patient taking differently: Inject 12 Units into the skin daily at 10 pm.)   . losartan (COZAAR) 100 MG tablet Take 1 tablet (100 mg total) by mouth daily.   . metoprolol succinate (TOPROL-XL) 25 MG 24 hr tablet Take 1/2 tablet by mouth daily at bedtime  for 6 days and then 1 tablet by mouth daily at bedtime (Patient taking differently: Take 25 mg by mouth at bedtime. Take 1 tablet by mouth daily at bedtime) 07/17/2020: Taking   . Multiple Vitamins-Minerals (MULTIVITAMIN WITH MINERALS) tablet Take 1 tablet by mouth daily. Women's 50+   . naproxen sodium (ANAPROX) 220 MG tablet Take 440 mg by mouth at bedtime. 2 tablets every night at bedtime   .  ondansetron (ZOFRAN) 4 MG tablet Take 1 tablet (4 mg total) by mouth every 8 (eight) hours as needed for nausea or vomiting. 07/19/2020: For after procedure   . OneTouch Delica Lancets 69S MISC Use as instructed to check blood sugars 2 times per day dx: e11.65   . OZEMPIC, 1 MG/DOSE, 2 MG/1.5ML SOPN INJECT INTO SKIN ONCE A WEEK (Patient taking differently: Inject 1 mg into the skin every Saturday. 1mg )   . simvastatin (ZOCOR) 10 MG tablet Take 1 tablet (10 mg total) by mouth daily.   [name] SYNTHROID 25 MCG tablet Take 0.5 tablet  (12.5 mcg) by mouth on Mondays through Fridays, then take 1 tablet (25 mcg) by mouth on Saturdays and Sundays. (Patient taking differently: Take 12.5-25 mcg by mouth See admin instructions. Take 0.5 tablet (12.5 mcg) by mouth on Mondays through Fridays, then take 1 tablet (25 mcg) by mouth on Saturdays and Sundays.)    No facility-administered encounter medications on file as of 07/24/2020.    Patient Active Problem List   Diagnosis Date Noted  . Type 2 diabetes mellitus with stage 3 chronic kidney disease, with long-term current use of insulin ([place]) 11/29/2019  . Panniculitis 09/30/2019  . Back pain 09/30/2019  . Hepatoma ([place]) 01/21/2019  . Other cirrhosis of liver ([place]) 07/05/2018  . Hypertensive nephropathy 07/05/2018  . Chronic renal disease, stage III ([place]) 07/05/2018  . Chronic left shoulder pain 07/05/2018  . Hypothyroidism 02/06/2018  . Hepatitis C virus infection cured after antiviral drug therapy 02/04/2018  . Osteoarthritis of right knee 07/30/2017  . S/P laparoscopic sleeve gastrectomy July 2018 08/26/2016  . Preop cardiovascular exam 07/30/2016  . Dyspnea on exertion 07/30/2016  . Class 2 severe obesity due to excess calories with serious comorbidity and body mass index (BMI) of 39.0 to 39.9 in adult ([place]) 07/30/2016  . Bilateral lower extremity edema 07/30/2016    Conditions to be addressed/monitored: HTN, DMII and CKD Stage III; Care Coordination Needs  Care Plan : Social Work Care Plan  Updates made by Daneen Schick since 07/24/2020 12:00 AM    Problem: Care Coordination     Long-Range Goal: Collaborate with RN Care Manager to perform appropriate assessments to assist with care coordination needs   Start Date: 03/02/2020  Expected End Date: 06/30/2020  Recent Progress: On track  Priority: Low  Note:   Current Barriers:  . Chronic conditions including DM II, CKD III, and HTN which put patient at increased risk of hospitalization  Social Work Clinical Goal(s):   [name] Over the next 120 days, patient will work with SW to address concerns related to care coordination  Interventions: . 1:1 collaboration with Glendale Chard, MD regarding development and update of comprehensive plan of care as evidenced by provider attestation and co-signature . Inter-disciplinary care team collaboration (see longitudinal plan of care) . Inbound call received from the patient who indicates she has not yet heard if [facility] will accept orders for post-surgery care . Discussed the patient is frustrated as she feels she will not receive needed care in the home for wound care . Reviewed normal process of home health orders being submitted at the time the service is needed rather than prior  . Determined the patient will plan to attend 9:00 am pre-admission testing on 6.1.22 . Encouraged the patient to discuss concerns with home health arrangements . Discussed the patient may have to postpone surgery and is not sure if/when she will be able to complete the surgery if postponed  because the surgeon will have to get the procedure approved by insurance again . Encouraged the patient to speak with her surgeons office regarding concerns . SW will follow up with the patient on 6.13 the patient is encouraged to contact SW sooner if needed  Patient Goals/Self-Care Activities Over the next 30 days, patient will:   -  Contact SW as needed prior to next scheduled call -Contact surgeons office as needed to address home health questions  Follow up Plan: SW will follow up with patient by phone over the next 21 days       Follow Up Plan: SW will follow up with patient by phone over the next 21 days.      Daneen Schick, BSW, CDP Social Worker, Certified Dementia Practitioner [place] [facility] [place] [facility] [phone]  Total time spent performing care coordination and/or care management activities with the patient by phone or face to face = 22 minutes.

## 2020-07-24 NOTE — Patient Instructions (Signed)
Social Worker Visit Information  Goals we discussed today:  Goals Addressed            This Visit's Progress   . Work with SW to manage care coordination needs       Timeframe:  Long-Range Goal Priority:  [employer] Start Date:   12.16.21                          Expected End Date:   3.16.22                    Next date of contact: 6.13.22  Patient Goals/Self-Care Activities Over the next 30 days, patient will:   - Contact SW as needed prior to next scheduled call -Energy manager office as needed to address home health questions        Materials Provided: Verbal education about home health referral process provided by phone  Follow Up Plan: SW will follow up with patient by phone over the next 21 days  Daneen Schick, BSW, CDP Social Worker, Certified Dementia Practitioner [place] [facility] [place] [facility] [phone]

## 2020-07-24 NOTE — Telephone Encounter (Signed)
Patient called to follow up on after surgery care because she hadn't heard anything. Please call patient to advise status. [phone]

## 2020-07-24 NOTE — Telephone Encounter (Signed)
Teresa Golden has spoken with the patient regarding the message below.     [facility] will not be able to accept patient because of staffing.   Patient's surgery has been rescheduled for (Aug. 3th).//AB/CMA

## 2020-07-25 ENCOUNTER — Ambulatory Visit (INDEPENDENT_AMBULATORY_CARE_PROVIDER_SITE_OTHER): Payer: Medicare HMO

## 2020-07-25 ENCOUNTER — Inpatient Hospital Stay (HOSPITAL_COMMUNITY): Admission: RE | Admit: 2020-07-25 | Payer: Medicare HMO | Source: Ambulatory Visit

## 2020-07-25 DIAGNOSIS — E1122 Type 2 diabetes mellitus with diabetic chronic kidney disease: Secondary | ICD-10-CM

## 2020-07-25 DIAGNOSIS — Z794 Long term (current) use of insulin: Secondary | ICD-10-CM

## 2020-07-25 DIAGNOSIS — I129 Hypertensive chronic kidney disease with stage 1 through stage 4 chronic kidney disease, or unspecified chronic kidney disease: Secondary | ICD-10-CM

## 2020-07-25 DIAGNOSIS — N183 Chronic kidney disease, stage 3 unspecified: Secondary | ICD-10-CM

## 2020-07-25 DIAGNOSIS — M793 Panniculitis, unspecified: Secondary | ICD-10-CM

## 2020-07-25 NOTE — Patient Instructions (Signed)
Social Worker Visit Information  Goals we discussed today:  Goals Addressed            This Visit's Progress   . Work with SW to manage care coordination needs   On track    Timeframe:  Long-Range Goal Priority:  [employer] Start Date:   12.16.21                                             Next date of contact: 7.26.22  Patient Goals/Self-Care Activities Over the next 60 days, patient will:   - Contact SW as needed prior to next scheduled call -Attend pre-op appointment on July 12th        Follow Up Plan: SW will follow up with patient by phone over the next 45 days   Daneen Schick, BSW, CDP Social Worker, Certified Dementia Practitioner [place] [facility] [place] [facility] [phone]

## 2020-07-25 NOTE — Chronic Care Management (AMB) (Signed)
Chronic Care Management    Social Work Note  07/25/2020 Name: Teresa Golden MRN: [id number] DOB: 07-06-1945  Teresa Golden is a 75 y.o. year old female who is a primary care patient of Glendale Chard, MD. The CCM team was consulted to assist the patient with chronic disease management and/or care coordination needs related to: [employer] .   Engaged with patient by telephone for follow up visit in response to provider referral for social work chronic care management and care coordination services.   Consent to Services:  The patient was given information about Chronic Care Management services, agreed to services, and gave verbal consent prior to initiation of services.  Please see initial visit note for detailed documentation.   Patient agreed to services and consent obtained.   Assessment: Review of patient past medical history, allergies, medications, and health status, including review of relevant consultants reports was performed today as part of a comprehensive evaluation and provision of chronic care management and care coordination services.     SDOH (Social Determinants of Health) assessments and interventions performed:    Advanced Directives Status: Not addressed in this encounter.  CCM Care Plan  Allergies  Allergen Reactions  . Meloxicam Other (See Comments)    Fever; muscle aches; "flu-like" symptoms  . Other     Rose fever and hay fever     Outpatient Encounter Medications as of 07/25/2020  Medication Sig Note  . amLODipine (NORVASC) 5 MG tablet Take 1 tablet (5 mg total) by mouth daily.   . B Complex-C (B-COMPLEX WITH VITAMIN C) tablet Take 1 tablet by mouth daily.   . Biotin 10000 MCG TABS Take 10,000 mcg by mouth daily.   . Cholecalciferol 5000 units TABS Take 5,000 Units by mouth daily.   . Colchicine 0.6 MG CAPS TAKE 1 CAPSULE DAILY AS    NEEDED (Patient taking differently: Take 0.6 mg by mouth daily as needed (gout).) 06/20/2020: Patient takes as needed   . Cranberry-Vitamin C 250-60 MG CAPS Take 1 tablet by mouth daily.   . diclofenac Sodium (VOLTAREN) 1 % GEL APPLY 2 GRAMS TO AFFECTED AREA(S) 4 TIMES DAILY AS NEEDED (Patient taking differently: Apply 2 g topically 4 (four) times daily as needed (pain).)   . enoxaparin (LOVENOX) 40 MG/0.4ML injection Inject 40 mg into the skin daily. Taking for 7 days after procedure 07/19/2020: After procedure  . gabapentin (NEURONTIN) 300 MG capsule Take 600 mg by mouth at bedtime. Pt taking 600 MG at bedtime   . glucose blood (ONETOUCH VERIO) test strip Use as instructed to check blood sugars 2 times per day dx: e11.65   . insulin degludec (TRESIBA FLEXTOUCH) 100 UNIT/ML SOPN FlexTouch Pen Inject 0.09 mLs (9 Units total) into the skin daily at 10 pm. (Patient taking differently: Inject 12 Units into the skin daily at 10 pm.)   . losartan (COZAAR) 100 MG tablet Take 1 tablet (100 mg total) by mouth daily.   . metoprolol succinate (TOPROL-XL) 25 MG 24 hr tablet Take 1/2 tablet by mouth daily at bedtime  for 6 days and then 1 tablet by mouth daily at bedtime (Patient taking differently: Take 25 mg by mouth at bedtime. Take 1 tablet by mouth daily at bedtime) 07/17/2020: Taking   . Multiple Vitamins-Minerals (MULTIVITAMIN WITH MINERALS) tablet Take 1 tablet by mouth daily. Women's 50+   . naproxen sodium (ANAPROX) 220 MG tablet Take 440 mg by mouth at bedtime. 2 tablets every night at bedtime   .  ondansetron (ZOFRAN) 4 MG tablet Take 1 tablet (4 mg total) by mouth every 8 (eight) hours as needed for nausea or vomiting. 07/19/2020: For after procedure   . OneTouch Delica Lancets 86P MISC Use as instructed to check blood sugars 2 times per day dx: e11.65   . OZEMPIC, 1 MG/DOSE, 2 MG/1.5ML SOPN INJECT INTO SKIN ONCE A WEEK (Patient taking differently: Inject 1 mg into the skin every Saturday. 1mg )   . simvastatin (ZOCOR) 10 MG tablet Take 1 tablet (10 mg total) by mouth daily.   [name] SYNTHROID 25 MCG tablet Take 0.5 tablet  (12.5 mcg) by mouth on Mondays through Fridays, then take 1 tablet (25 mcg) by mouth on Saturdays and Sundays. (Patient taking differently: Take 12.5-25 mcg by mouth See admin instructions. Take 0.5 tablet (12.5 mcg) by mouth on Mondays through Fridays, then take 1 tablet (25 mcg) by mouth on Saturdays and Sundays.)    No facility-administered encounter medications on file as of 07/25/2020.    Patient Active Problem List   Diagnosis Date Noted  . Type 2 diabetes mellitus with stage 3 chronic kidney disease, with long-term current use of insulin ([place]) 11/29/2019  . Panniculitis 09/30/2019  . Back pain 09/30/2019  . Hepatoma ([place]) 01/21/2019  . Other cirrhosis of liver ([place]) 07/05/2018  . Hypertensive nephropathy 07/05/2018  . Chronic renal disease, stage III ([place]) 07/05/2018  . Chronic left shoulder pain 07/05/2018  . Hypothyroidism 02/06/2018  . Hepatitis C virus infection cured after antiviral drug therapy 02/04/2018  . Osteoarthritis of right knee 07/30/2017  . S/P laparoscopic sleeve gastrectomy July 2018 08/26/2016  . Preop cardiovascular exam 07/30/2016  . Dyspnea on exertion 07/30/2016  . Class 2 severe obesity due to excess calories with serious comorbidity and body mass index (BMI) of 39.0 to 39.9 in adult ([facility]) 07/30/2016  . Bilateral lower extremity edema 07/30/2016    Conditions to be addressed/monitored: HTN, DMII and CKD Stage III; Upcomming Panniculectomy  Care Plan : Social Work Care Plan  Updates made by Daneen Schick since 07/25/2020 12:00 AM    Problem: Care Coordination     Long-Range Goal: Identify resources to assist with vehicle adaptations Completed 07/25/2020  Start Date: 02/09/2020  Expected End Date: 05/09/2020  Recent Progress: On track  Priority: Medium  Note:   Current Barriers:  . Transportation- unable to transport self due to barriers surrounding use of foot pedals . Limited knowledge of resources to assist with vehicle adaptations . Chronic  conditions including DM II, CKD III, and HTN which put patient at increased risk of hospitalization  Social Work Clinical Goal(s):  [name] Over the next 120 days the patient will work with SW to identify resources to assist with desired vehicle adaptions   6.1.22- Goal Closed, no longer a patient priority  Interventions: . 1:1 collaboration with Glendale Chard, MD regarding development and update of comprehensive plan of care as evidenced by provider attestation and co-signature . Inter-disciplinary care team collaboration (see longitudinal plan of care) . Inbound call received from [facility] who reports the program does not assist with vehicle modifications . Successful outbound call placed to the patient  . Discussed the patient is still interested in vehicle adaptations but will most likely want to wait until after an upcoming surgery scheduled for 4.4.22 . Patient requests SW determine if Medicare benefit will cover any of the cost of vehicle adaptations . SW will research resources and insurance benefits prior to next scheduled call  Patient Goals/Self-Care Activities Over  the next 45 days, patient will:   - Patient will self administer medications as prescribed Patient will attend all scheduled provider appointments Patient will call provider office for new concerns or questions Contact SW as needed prior to next scheduled call  Follow up Plan: SW will follow up with patient by phone over the next 60 days    Long-Range Goal: Collaborate with RN Care Manager to perform appropriate assessments to assist with care coordination needs   Start Date: 03/02/2020  This Visit's Progress: On track  Recent Progress: On track  Priority: Low  Note:   Current Barriers:  . Chronic conditions including DM II, CKD III, and HTN which put patient at increased risk of hospitalization . Upcomming Panniculectomy without home health in place  Social Work Clinical Goal(s):  . patient will work with  SW to address concerns related to DM II, CKD III, and HTN and manage care coordination needs  Interventions: . 1:1 collaboration with Glendale Chard, MD regarding development and update of comprehensive plan of care as evidenced by provider attestation and co-signature . Inter-disciplinary care team collaboration (see longitudinal plan of care) . Successful outbound call placed to the patient in response to voice message received . Discussed the patients surgeon has postponed her Panniculectomy until August 3rd due to barriers with after care in the home . Patient reports [facility] was unable to accept her as a patient June 6th due to staffing challenges. Due to patients friend being unavailable to stay overnight as well as multiple home health agencies declining orders, Dr. Marla Roe felt it was best to postpone the procedure . Noted patient has pre-op appointment scheduled July 12th with surgery planned for August 3rd - patient reports she is happy with the change and feels less anxious . Discussed the patients friend will be in town and available to assist. She will spend the night with the patient on August 2nd to assist with transportation the day of the surgery. Patient reports her friend will also stay overnight several days after returning home to assist  . Patient reports she has gained approximately 5 pounds over the last few months- she notes her goal is to lose 15 pounds prior to surgery . Patient will begin attending water aerobics classes on Monday, Wednesday, and Fridays beginning June 6th . Discussed the patient received her second COVID booster vaccine on 5.13.22- SW updated vaccine record . Scheduled follow up call to the patient over the next 45 days . Encouraged the patient to contact SW sooner as needed . Collaboration with patients primary care team including Dr. Baird Cancer, Barb Merino RN Care Manager, and [name] PharmD to update on postponed procedure  Patient  Goals/Self-Care Activities Over the next 60 days, patient will:   -  Contact SW as needed prior to next scheduled call -Attend pre-op appointment on July 12th  Follow up Plan: SW will follow up with patient by phone over the next 45 days       Follow Up Plan: SW will follow up with patient by phone over the next 45 days      Daneen Schick, BSW, CDP Social Worker, Certified Dementia Practitioner [facility] [phone]  Total time spent performing care coordination and/or care management activities with the patient by phone or face to face = 56 minutes.

## 2020-07-26 ENCOUNTER — Ambulatory Visit
Admission: RE | Admit: 2020-07-26 | Discharge: 2020-07-26 | Disposition: A | Discharge status: Home / Self Care | Payer: Medicare HMO | Source: Ambulatory Visit | Attending: Internal Medicine | Admitting: Internal Medicine

## 2020-07-26 ENCOUNTER — Other Ambulatory Visit: Payer: Self-pay

## 2020-07-26 DIAGNOSIS — Z1231 Encounter for screening mammogram for malignant neoplasm of breast: Secondary | ICD-10-CM

## 2020-07-27 ENCOUNTER — Telehealth: Payer: Medicare HMO

## 2020-07-27 ENCOUNTER — Other Ambulatory Visit (HOSPITAL_COMMUNITY): Payer: Medicare HMO

## 2020-07-30 ENCOUNTER — Telehealth: Payer: Self-pay | Admitting: Addiction (Substance Use Disorder)

## 2020-07-30 NOTE — Telephone Encounter (Signed)
Rtc to pt and she just needed to vent about her day.She is not suicidal or anything of that nature.She wanted to talk to you but I let her know you are seeing pts and may not be able to return her call.She did feel a little better by talking to me.No action needed unless you do get the time to call her.

## 2020-07-30 NOTE — Telephone Encounter (Signed)
Next appt is 08/06/20. Teresa Golden's surgery was postponed and she said she is having a few things going on now. She states she doesn't want to jump off a bridge but is asking to speak to [facility]. I told her that the messages are screened to clinical side first. Her phone number is [phone].

## 2020-08-01 ENCOUNTER — Telehealth: Payer: Medicare HMO

## 2020-08-01 ENCOUNTER — Telehealth: Payer: Self-pay

## 2020-08-01 NOTE — Telephone Encounter (Signed)
  Care Management   Follow Up Note   08/01/2020 Name: Teresa Golden MRN: [id number] DOB: [birth year]   Referred by: Glendale Chard, MD Reason for referral : Chronic Care Management (Inbound call from patient )  Received a voice message from patient requesting a return phone call. An unsuccessful telephone outreach was attempted today. I spoke with Ms. Callens briefly, unfortunately she is unavailable to speak with me today. The patient was referred to the case management team for assistance with care management and care coordination.   Follow Up Plan: Telephone follow up appointment with care management team member scheduled for: 08/28/20  Barb Merino, RN, BSN, CCM Care Management Coordinator [facility] [facility]  Direct Phone: [phone]

## 2020-08-02 ENCOUNTER — Ambulatory Visit: Payer: Medicare HMO

## 2020-08-02 DIAGNOSIS — E1122 Type 2 diabetes mellitus with diabetic chronic kidney disease: Secondary | ICD-10-CM

## 2020-08-02 DIAGNOSIS — Z794 Long term (current) use of insulin: Secondary | ICD-10-CM | POA: Diagnosis not present

## 2020-08-02 DIAGNOSIS — N183 Chronic kidney disease, stage 3 unspecified: Secondary | ICD-10-CM | POA: Diagnosis not present

## 2020-08-02 DIAGNOSIS — F419 Anxiety disorder, unspecified: Secondary | ICD-10-CM

## 2020-08-02 DIAGNOSIS — M793 Panniculitis, unspecified: Secondary | ICD-10-CM

## 2020-08-02 DIAGNOSIS — I129 Hypertensive chronic kidney disease with stage 1 through stage 4 chronic kidney disease, or unspecified chronic kidney disease: Secondary | ICD-10-CM

## 2020-08-02 DIAGNOSIS — Z6836 Body mass index (BMI) 36.0-36.9, adult: Secondary | ICD-10-CM

## 2020-08-02 NOTE — Patient Instructions (Signed)
Goals Addressed      Complete Panniculectomy   On track    Timeframe:  Long-Range Goal Priority:  Medium Start Date: 02/29/20                            Expected End Date: 10/24/20  Next Follow up date: 09/24/20  Patient Self-Care Activities:  Keep the following appointments related to pre-op and post-op Panniculectomy 09/04/20-Pre-Op visit for Panniculectomy procedure (Dr. Marla Roe) 09/24/20-Lab visit for Panniculectomy procedure 09/26/20-Panniculectomy outpatient/inpatient procedure  09/30/20-Post-Op Panniculectomy follow up appointment with Dr. Marla Roe  10/12/20-Post-Op Panniculectomy follow up appointments with Roetta Sessions, PA-C

## 2020-08-02 NOTE — Progress Notes (Signed)
This encounter was created in error - please disregard.

## 2020-08-02 NOTE — Chronic Care Management (AMB) (Signed)
Chronic Care Management   CCM RN Visit Note  08/02/2020 Name: SELENE PELTZER MRN: [id number] DOB: 12-Mar-1945  Subjective: KENEDI CILIA is a 75 y.o. year old female who is a primary care patient of Glendale Chard, MD. The care management team was consulted for assistance with disease management and care coordination needs.    Engaged with patient by telephone for follow up visit in response to provider referral for case management and/or care coordination services.   Consent to Services:  The patient was given information about Chronic Care Management services, agreed to services, and gave verbal consent prior to initiation of services.  Please see initial visit note for detailed documentation.   Patient agreed to services and verbal consent obtained.   Assessment: Review of patient past medical history, allergies, medications, health status, including review of consultants reports, laboratory and other test data, was performed as part of comprehensive evaluation and provision of chronic care management services.   SDOH (Social Determinants of Health) assessments and interventions performed:  Yes, no acute needs   CCM Care Plan  Allergies  Allergen Reactions   Meloxicam Other (See Comments)    Fever; muscle aches; "flu-like" symptoms   Other     Rose fever and hay fever     Outpatient Encounter Medications as of 08/02/2020  Medication Sig Note   amLODipine (NORVASC) 5 MG tablet Take 1 tablet (5 mg total) by mouth daily.    B Complex-C (B-COMPLEX WITH VITAMIN C) tablet Take 1 tablet by mouth daily.    Biotin 10000 MCG TABS Take 10,000 mcg by mouth daily.    Cholecalciferol 5000 units TABS Take 5,000 Units by mouth daily.    Colchicine 0.6 MG CAPS TAKE 1 CAPSULE DAILY AS    NEEDED (Patient taking differently: Take 0.6 mg by mouth daily as needed (gout).) 06/20/2020: Patient takes as needed   Cranberry-Vitamin C 250-60 MG CAPS Take 1 tablet by mouth daily.    diclofenac Sodium  (VOLTAREN) 1 % GEL APPLY 2 GRAMS TO AFFECTED AREA(S) 4 TIMES DAILY AS NEEDED (Patient taking differently: Apply 2 g topically 4 (four) times daily as needed (pain).)    enoxaparin (LOVENOX) 40 MG/0.4ML injection Inject 40 mg into the skin daily. Taking for 7 days after procedure 07/19/2020: After procedure   gabapentin (NEURONTIN) 300 MG capsule Take 600 mg by mouth at bedtime. Pt taking 600 MG at bedtime    glucose blood (ONETOUCH VERIO) test strip Use as instructed to check blood sugars 2 times per day dx: e11.65    insulin degludec (TRESIBA FLEXTOUCH) 100 UNIT/ML SOPN FlexTouch Pen Inject 0.09 mLs (9 Units total) into the skin daily at 10 pm. (Patient taking differently: Inject 12 Units into the skin daily at 10 pm.)    losartan (COZAAR) 100 MG tablet Take 1 tablet (100 mg total) by mouth daily.    metoprolol succinate (TOPROL-XL) 25 MG 24 hr tablet Take 1/2 tablet by mouth daily at bedtime  for 6 days and then 1 tablet by mouth daily at bedtime (Patient taking differently: Take 25 mg by mouth at bedtime. Take 1 tablet by mouth daily at bedtime) 07/17/2020: Taking    Multiple Vitamins-Minerals (MULTIVITAMIN WITH MINERALS) tablet Take 1 tablet by mouth daily. Women's 50+    naproxen sodium (ANAPROX) 220 MG tablet Take 440 mg by mouth at bedtime. 2 tablets every night at bedtime    ondansetron (ZOFRAN) 4 MG tablet Take 1 tablet (4 mg total) by mouth every 8 (eight)  hours as needed for nausea or vomiting. 07/19/2020: For after procedure    OneTouch Delica Lancets 35W MISC Use as instructed to check blood sugars 2 times per day dx: e11.65    OZEMPIC, 1 MG/DOSE, 2 MG/1.5ML SOPN INJECT INTO SKIN ONCE A WEEK (Patient taking differently: Inject 1 mg into the skin every Saturday. 1mg )    simvastatin (ZOCOR) 10 MG tablet Take 1 tablet (10 mg total) by mouth daily.    SYNTHROID 25 MCG tablet Take 0.5 tablet (12.5 mcg) by mouth on Mondays through Fridays, then take 1 tablet (25 mcg) by mouth on Saturdays and  Sundays. (Patient taking differently: Take 12.5-25 mcg by mouth See admin instructions. Take 0.5 tablet (12.5 mcg) by mouth on Mondays through Fridays, then take 1 tablet (25 mcg) by mouth on Saturdays and Sundays.)    No facility-administered encounter medications on file as of 08/02/2020.    Patient Active Problem List   Diagnosis Date Noted   Type 2 diabetes mellitus with stage 3 chronic kidney disease, with long-term current use of insulin ([place]) 11/29/2019   Panniculitis 09/30/2019   Back pain 09/30/2019   Hepatoma ([place]) 01/21/2019   Other cirrhosis of liver ([place]) 07/05/2018   Hypertensive nephropathy 07/05/2018   Chronic renal disease, stage III ([place]) 07/05/2018   Chronic left shoulder pain 07/05/2018   Hypothyroidism 02/06/2018   Hepatitis C virus infection cured after antiviral drug therapy 02/04/2018   Osteoarthritis of right knee 07/30/2017   S/P laparoscopic sleeve gastrectomy July 2018 08/26/2016   Preop cardiovascular exam 07/30/2016   Dyspnea on exertion 07/30/2016   Class 2 severe obesity due to excess calories with serious comorbidity and body mass index (BMI) of 39.0 to 39.9 in adult ([place]) 07/30/2016   Bilateral lower extremity edema 07/30/2016    Conditions to be addressed/monitored: DM, CKD III, Hypertensive Nephropathy, Class 2 severe obesity, Anxiety, Panniculitis   Care Plan : Elective Surgery  Updates made by Lynne Logan, RN since 08/02/2020 12:00 AM   Problem: Panniculectomy Procedure   Priority: Medium     Long-Range Goal: Manage stretched out, excess fat and overhanging skin from your abdomen   Start Date: 02/29/2020  Expected End Date: 12/24/2020  Recent Progress: On track  Priority: Medium  Note:   Current Barriers:  Ineffective Self Health Maintenance Currently UNABLE TO independently self manage needs related to chronic health conditions.  Knowledge Deficits related to short term plan for care coordination needs and long term plans for chronic  disease management needs Nurse Case Manager Clinical Goal(s):  Patient will work with care management team to address care coordination and chronic disease management needs related to Disease Management Educational Needs Care Coordination Medication Management and Education Psychosocial Support   Interventions:  08/01/20 completed successful outbound call with patient  Determined patient will undergo a Panniculectomy procedure by Dr. Marla Roe on 09/26/20 Determined this procedure is scheduled for outpatient but patient may need inpatient services pending hemodynamic stability following the procedure Determined and discussed the following appointments related to this procedure:  09/04/20-Pre-Op visit for Panniculectomy procedure (Dr. Marla Roe) 09/24/20-Lab visit for Panniculectomy procedure 09/26/20-Panniculectomy outpatient/inpatient procedure  09/30/20-Post-Op Panniculectomy follow up appointment with Dr. Marla Roe  10/12/20-Post-Op Panniculectomy follow up appointments with Roetta Sessions, PA-C  Determined patient will have a friend available and use an agency referred by Holland Falling to assist her as needed following surgery Discussed plans with patient for ongoing care management follow up and provided patient with direct contact information for care management team. Patient Self-Care  Activities:  Keep the following appointments related to pre-op and post-op Panniculectomy 09/04/20-Pre-Op visit for Panniculectomy procedure (Dr. Marla Roe) 09/24/20-Lab visit for Panniculectomy procedure 09/26/20-Panniculectomy outpatient/inpatient procedure  09/30/20-Post-Op Panniculectomy follow up appointment with Dr. Marla Roe  10/12/20-Post-Op Panniculectomy follow up appointments with Roetta Sessions, PA-C               Follow Up Plan: Telephone follow up appointment with care management team member scheduled for:  09/24/20    Plan:Telephone follow up appointment with care management team member scheduled for:   09/24/20  Barb Merino, RN, BSN, CCM Care Management Coordinator [place] [facility]  Direct Phone: [phone]

## 2020-08-06 ENCOUNTER — Ambulatory Visit (INDEPENDENT_AMBULATORY_CARE_PROVIDER_SITE_OTHER): Payer: Medicare HMO | Admitting: Addiction (Substance Use Disorder)

## 2020-08-06 ENCOUNTER — Telehealth: Payer: Medicare HMO

## 2020-08-06 DIAGNOSIS — F4323 Adjustment disorder with mixed anxiety and depressed mood: Secondary | ICD-10-CM

## 2020-08-06 NOTE — Progress Notes (Signed)
[facility] Counselor/Therapist Progress Note  [name], MRN: [id number],    Date: 08/06/2020   Time Spent:  50mins  Treatment Type: Individual Therapy  Reported Symptoms:  feeling low about herself  Mental Status Exam:  Appearance:   Well Groomed     Behavior:  Appropriate and Sharing  Motor:  Normal  Speech/Language:   Clear and Coherent  Affect:  Appropriate  Mood:  anxious and sad  Thought process:  normal  Thought content:    Rumination  Sensory/Perceptual disturbances:    WNL  Orientation:  x4  Attention:  Good  Concentration:  Good  Memory:  WNL  Fund of knowledge:   Good  Insight:    Fair  Judgment:   Fair  Impulse Control:  Good   Risk Assessment: Danger to Self:  No Self-injurious Behavior: No Danger to Others: No Duty to Warn:no Physical Aggression / Violence:No  Access to Firearms a concern: No  Gang Involvement:No    Virtual Visit via VIDEO: I connected with client by alternative video enabled telemedicine/telehealth application (due to [employer] video not working), with their informed consent, and verified client privacy and that I am speaking with the correct person using two identifiers. I discussed the limitations, risks, security and privacy concerns of performing psychotherapy and management service virtually and confirmed their location. I also discussed with the patient that there may be a patient responsible charge related to this service and to confirm with the front desk if their insurance covers teletherapy. I also discussed with the patient the availability of in person appointments. The patient expressed understanding and agreed to proceed. I discussed the treatment planning with the client. The client was provided an opportunity to ask questions and all were answered. The client agreed with the plan and demonstrated an understanding of the instructions. The client was advised to call our office if symptoms worsen or feel they  are in a crisis state and need immediate contact. Client also reminded of a crisis line number and to use 9-1-1 if there's an emergency.  Therapist Location: office; Client Location: home.   Subjective: Client reported more about feeling like a failure these days. Client processed more about her childhood growing up with a mom who was mentally ill and made sure that other people thought her daughter, Teresa Golden, was to, isolateing her and embarrassing her. Therapist used MI & grief therapy validating client's experience, supporting her as she processed, esp about the grief of feeling ill and alone. Client reported more of her thoughts that come up that tell her shes not enough: like her core belief. Therapist used CBT to challenge that core belief working to help her recognize intrusive thoughts compared to her own thoughts, learning not to listen to the voice that's not hers that says: "shes never going to be enough". Therapist assessed for stability and client denied SI/HI/AVH.   Interventions: Cognitive Behavioral Therapy, Motivational Interviewing, Grief Therapy, and RPT    Diagnosis:   ICD-10-CM   1. Adjustment disorder with mixed anxiety and depressed mood  F43.23        Plan of Care:  Client is to return to therapy with therapist every 1-2 weeks as needed to process traumas/ frustrations in a safe space, to be re-evaluated in 3 months.  Client is to practice mindfulness AEB daily meditation and body scans or as needed when flooded by emotion/pain.  Client is to learn/practice DBT wise mind & radical acceptance. Client is  to practice self-compassion AEB being gentle with themselves, utilizing self-care techniques daily or as needed when grieving something in the moment.  Client is to process grief/pain of their loss in a somatic body-felt sense way: ie using mindfulness, brainspotting, or trauma release as a method for releasing body pain/tension caused by grief.  Barnie Del, LCSW, LCAS, CCTP,  CCS-I, BSP

## 2020-08-07 ENCOUNTER — Encounter: Payer: Medicare HMO | Admitting: Surgical

## 2020-08-08 ENCOUNTER — Ambulatory Visit: Payer: Medicare HMO | Admitting: Addiction (Substance Use Disorder)

## 2020-08-14 ENCOUNTER — Encounter: Payer: Medicare HMO | Admitting: Plastic Surgery

## 2020-08-14 ENCOUNTER — Telehealth: Payer: Self-pay

## 2020-08-14 NOTE — Chronic Care Management (AMB) (Signed)
  Patient aware of telephone appointment with Teresa Golden CPP on 08-15-2020 at 2:00.Patient aware to have/bring all medications, supplements, blood pressure and/or blood sugar logs to visit.  Questions: Have you had any recent office visit or specialist visit outside of [place]? Patient stated no  Are there any concerns you would like to discuss during your office visit? Patient stated no  Are you having any problems obtaining your medications? (Whether it pharmacy issues or cost) Patient stated no  If patient has any PAP medications ask if they are having any problems getting their PAP medication or refill? Patient stated no  Star Rating Drug: Simvastatin 10 mg- Last filled 06-01-2020 [facility] Ozempic 1 mg- Patient assistance Losartan 100 mg- Last filled 05-08-2020 [facility].  Any gaps in medications fill history? No  [facility] Pharmacist Assistant [phone]

## 2020-08-15 ENCOUNTER — Ambulatory Visit: Payer: Self-pay

## 2020-08-15 ENCOUNTER — Other Ambulatory Visit: Payer: Self-pay

## 2020-08-15 DIAGNOSIS — E1122 Type 2 diabetes mellitus with diabetic chronic kidney disease: Secondary | ICD-10-CM

## 2020-08-15 DIAGNOSIS — Z794 Long term (current) use of insulin: Secondary | ICD-10-CM

## 2020-08-15 DIAGNOSIS — I129 Hypertensive chronic kidney disease with stage 1 through stage 4 chronic kidney disease, or unspecified chronic kidney disease: Secondary | ICD-10-CM

## 2020-08-15 MED ORDER — METOPROLOL SUCCINATE ER 25 MG PO TB24: ORAL_TABLET | ORAL | 1 refills | Status: DC

## 2020-08-15 NOTE — Progress Notes (Signed)
Chronic Care Management Pharmacy Note  08/22/2020 Name:  Teresa Golden MRN:  [id number] DOB:  16-Jun-1945  Summary: Patient reports that she is concerned about her current housing condition.   Recommendations/Changes made from today's visit: Recommend patient receive refills of Metoprolol succinate [facility] Recommend patient receive PCV -13   Plan: Patients prescription sent to mail order CCM team to follow up with patient about housing concerns.    Subjective: Teresa Golden is an 75 y.o. year old female who is a primary patient of Glendale Chard, MD.  The CCM team was consulted for assistance with disease management and care coordination needs.    Engaged with patient by telephone for follow up visit in response to provider referral for pharmacy case management and/or care coordination services.  Patient reports that she is very concerned about her housing circumstances due to an increase in her rent.   Consent to Services:  The patient was given information about Chronic Care Management services, agreed to services, and gave verbal consent prior to initiation of services.  Please see initial visit note for detailed documentation.   Patient Care Team: Glendale Chard, MD as PCP - General (Internal Medicine) Elouise Munroe, MD as PCP - Cardiology (Cardiology) Daneen Schick as Social Worker [place], Claudette Stapler, RN as Case Manager Mayford Knife, [facility] (Pharmacist)  Recent office visits: 05/31/2020 PCP OV  Recent consult visits: 06/21/2020 [facility] consult 05/07/2020 Cardiology [facility] visits: None in previous 6 months   Objective:  Lab Results  Component Value Date   CREATININE 0.95 05/31/2020   BUN 14 05/31/2020   GFRNONAA 58 (L) 11/29/2019   GFRAA 67 11/29/2019   NA 141 05/31/2020   K 4.1 05/31/2020   CALCIUM 9.3 05/31/2020   CO2 22 05/31/2020   GLUCOSE 120 (H) 05/31/2020    Lab Results  Component Value Date/Time   HGBA1C  6.2 (H) 05/31/2020 10:41 AM   HGBA1C 6.7 (H) 11/29/2019 02:11 PM   MICROALBUR 10 02/09/2020 10:59 AM   MICROALBUR 10 01/17/2019 06:09 PM    Last diabetic Eye exam:  Lab Results  Component Value Date/Time   HMDIABEYEEXA No Retinopathy 01/11/2020 12:00 AM    Last diabetic Foot exam: No results found for: HMDIABFOOTEX   Lab Results  Component Value Date   CHOL 150 02/09/2020   HDL 61 02/09/2020   LDLCALC 77 02/09/2020   TRIG 55 02/09/2020   CHOLHDL 2.5 02/09/2020    Hepatic Function Latest Ref Rng & Units 05/31/2020 11/29/2019 08/18/2019  Total Protein 6.0 - 8.5 g/dL 6.7 7.0 7.1  Albumin 3.7 - 4.7 g/dL 3.9 4.0 3.9  AST 0 - 40 IU/L _0 ALT 0 - 32 IU/L _1 Alk Phosphatase 44 - 121 IU/L 79 84 94  Total Bilirubin 0.0 - 1.2 mg/dL 0.4 0.4 0.5    Lab Results  Component Value Date/Time   TSH 2.840 05/31/2020 10:41 AM   TSH 1.230 07/05/2018 11:17 AM   FREET4 1.54 07/05/2018 11:17 AM    CBC Latest Ref Rng & Units 05/31/2020 02/09/2020 01/17/2019  WBC 3.4 - 10.8 x10E3/uL 6.4 10.8 7.1  Hemoglobin 11.1 - 15.9 g/dL 11.4 11.3 11.8  Hematocrit 34.0 - 46.6 % 32.8(L) 34.0 34.5  Platelets 150 - 450 x10E3/uL 201 223 198    Lab Results  Component Value Date/Time   VD25OH 81.3 02/09/2020 02:43 PM    Clinical ASCVD: No  The 10-year  ASCVD risk score Mikey Bussing DC Jr., et al., 2013) is: 50.3%   Values used to calculate the score:     Age: 64 years     Sex: Female     Is Non-Hispanic African American: Yes     Diabetic: Yes     Tobacco smoker: Yes     Systolic Blood Pressure: 379 mmHg     Is BP treated: Yes     HDL Cholesterol: 61 mg/dL     Total Cholesterol: 150 mg/dL    Depression screen [facility] 2/9 03/15/2020 08/18/2019 02/08/2019  Decreased Interest 0 0 0  Down, Depressed, Hopeless 2 0 0  PHQ - 2 Score 2 0 0  Altered sleeping 1 0 0  Tired, decreased energy 2 3 0  Change in appetite 0 0 0  Feeling bad or failure about yourself  2 0 0  Trouble concentrating 0 0 0  Moving slowly  or fidgety/restless 0 0 0  Suicidal thoughts 0 0 0  PHQ-9 Score 7 3 0  Difficult doing work/chores - Not difficult at all Not difficult at all  Some recent data might be hidden     Social History   Tobacco Use  Smoking Status Former   Packs/day: 0.25   Years: 10.00   Pack years: 2.50   Types: Cigarettes  Smokeless Tobacco Never  Tobacco Comments   quit 20 years ago   BP Readings from Last 3 Encounters:  07/17/20 132/72  07/03/20 (!) 178/83  06/20/20 (!) 147/74   Pulse Readings from Last 3 Encounters:  07/17/20 82  07/03/20 61  05/31/20 61   Wt Readings from Last 3 Encounters:  07/17/20 215 lb 6.4 oz (97.7 kg)  07/03/20 215 lb (97.5 kg)  05/31/20 214 lb (97.1 kg)   BMI Readings from Last 3 Encounters:  07/17/20 41.79 kg/m  07/03/20 35.78 kg/m  05/31/20 30.71 kg/m    Assessment/Interventions: Review of patient past medical history, allergies, medications, health status, including review of consultants reports, laboratory and other test data, was performed as part of comprehensive evaluation and provision of chronic care management services.   SDOH:  (Social Determinants of Health) assessments and interventions performed: No SDOH Interventions    Flowsheet Row Most Recent Value  SDOH Interventions   Financial Strain Interventions Other (Comment)  [patient assistance is active for patient, patient has concerns about housing will flet CCM SW know.]      SDOH Screenings   Alcohol Screen: Not on file  Depression (PHQ2-9): Medium Risk   PHQ-2 Score: 7  Financial Resource Strain: High Risk   Difficulty of Paying Living Expenses: Very hard  Food Insecurity: Not on file  Housing: Not on file  Physical Activity: Not on file  Social Connections: Not on file  Stress: Not on file  Tobacco Use: Medium Risk   Smoking Tobacco Use: Former   Smokeless Tobacco Use: Never  Transportation Needs: Unmet Transportation Needs   Lack of Transportation (Medical): No   Lack  of Transportation (Non-Medical): Yes    CCM Care Plan  Allergies  Allergen Reactions   Meloxicam Other (See Comments)    Fever; muscle aches; "flu-like" symptoms   Other     Rose fever and hay fever     Medications Reviewed Today     Reviewed by Lynne Logan, RN (Registered Nurse) on 08/02/20 at [place] List Status: <None>   Medication Order Taking? Sig Documenting Provider Last Dose Status Informant  amLODipine (NORVASC) 5 MG tablet  [id number] No Take 1 tablet (5 mg total) by mouth daily. Glendale Chard, MD Taking Active Self  B Complex-C (B-COMPLEX WITH VITAMIN C) tablet [id number] No Take 1 tablet by mouth daily. [provider] Taking Active Self  Biotin 10000 MCG TABS [id number] No Take 10,000 mcg by mouth daily. [provider] Taking Active Self  Cholecalciferol 5000 units TABS [id number] No Take 5,000 Units by mouth daily. [provider] Taking Active Self           Med Note Jimmey Ralph, [facility] I   Fri Aug 07, 2017  6:51 PM)    Colchicine 0.6 MG CAPS [id number] No TAKE 1 CAPSULE DAILY AS    NEEDED  Patient taking differently: Take 0.6 mg by mouth daily as needed (gout).   Glendale Chard, MD Taking Active Self           Med Note Clinton Gallant, JENNA B   Wed Jun 20, 2020  8:45 AM) Patient takes as needed  Cranberry-Vitamin C 250-60 MG CAPS [id number]  Take 1 tablet by mouth daily. [provider]  Active Self  diclofenac Sodium (VOLTAREN) 1 % GEL [id number] No APPLY 2 GRAMS TO AFFECTED AREA(S) 4 TIMES DAILY AS NEEDED  Patient taking differently: Apply 2 g topically 4 (four) times daily as needed (pain).   Glendale Chard, MD Taking Active Self  enoxaparin (LOVENOX) 40 MG/0.4ML injection [id number]  Inject 40 mg into the skin daily. Taking for 7 days after procedure [provider]  Active Self           Med Note Wilmon Pali, MELISSA R   Thu Jul 19, 2020  1:24 PM) After procedure  gabapentin (NEURONTIN) 300 MG capsule [id number] No Take 600  mg by mouth at bedtime. Pt taking 600 MG at bedtime [provider] Taking Active Self  glucose blood (ONETOUCH VERIO) test strip [id number] No Use as instructed to check blood sugars 2 times per day dx: e11.65 Glendale Chard, MD Taking Active Self  insulin degludec (TRESIBA FLEXTOUCH) 100 UNIT/ML SOPN FlexTouch Pen [id number] No Inject 0.09 mLs (9 Units total) into the skin daily at 10 pm.  Patient taking differently: Inject 12 Units into the skin daily at 10 pm.   Glendale Chard, MD Taking Active Self  losartan (COZAAR) 100 MG tablet [id number] No Take 1 tablet (100 mg total) by mouth daily. Glendale Chard, MD Taking Active Self  metoprolol succinate (TOPROL-XL) 25 MG 24 hr tablet [id number] No Take 1/2 tablet by mouth daily at bedtime  for 6 days and then 1 tablet by mouth daily at bedtime  Patient taking differently: Take 25 mg by mouth at bedtime. Take 1 tablet by mouth daily at bedtime   Glendale Chard, MD Taking Active Self           Med Note Nile Dear Jul 17, 2020  9:38 AM) Taking   Multiple Vitamins-Minerals (MULTIVITAMIN WITH MINERALS) tablet [id number] No Take 1 tablet by mouth daily. Women's 50+ [provider] Taking Active Self           Med Note Jimmey Ralph, [facility] I   Fri Aug 07, 2017  6:53 PM)    naproxen sodium (ANAPROX) 220 MG tablet [id number] No Take 440 mg by mouth at bedtime. 2 tablets every night at bedtime [provider] Taking Active Self           Med Note Jimmey Ralph, [facility] I   Fri Aug 07, 2017  6:53 PM)    ondansetron (  ZOFRAN) 4 MG tablet [id number]  Take 1 tablet (4 mg total) by mouth every 8 (eight) hours as needed for nausea or vomiting. Scheeler, Carola Rhine, PA-C  Active Self           Med Note Wilmon Pali, MELISSA R   Thu Jul 19, 2020  1:21 PM) For after procedure   Jonetta Speak Lancets 62I MISC [id number] No Use as instructed to check blood sugars 2 times per day dx: e11.65 Glendale Chard, MD Taking Active Self  OZEMPIC, 1  MG/DOSE, 2 MG/1.5ML Bonney Aid [id number] No INJECT INTO SKIN ONCE A WEEK  Patient taking differently: Inject 1 mg into the skin every Saturday. 52m   SGlendale Chard MD Taking Active Self  simvastatin (ZOCOR) 10 MG tablet [id number] Take 1 tablet (10 mg total) by mouth daily. SGlendale Chard MD Taking Active Self  SYNTHROID 25 MCG tablet [id number] Take 0.5 tablet (12.5 mcg) by mouth on Mondays through Fridays, then take 1 tablet (25 mcg) by mouth on Saturdays and Sundays.  Patient taking differently: Take 12.5-25 mcg by mouth See admin instructions. Take 0.5 tablet (12.5 mcg) by mouth on Mondays through Fridays, then take 1 tablet (25 mcg) by mouth on Saturdays and Sundays.   SGlendale Chard MD Taking Active Self            Patient Active Problem List   Diagnosis Date Noted   Type 2 diabetes mellitus with stage 3 chronic kidney disease, with long-term current use of insulin (HSunset 11/29/2019   Panniculitis 09/30/2019   Back pain 09/30/2019   Hepatoma ([place] 01/21/2019   Other cirrhosis of liver ([place] 07/05/2018   Hypertensive nephropathy 07/05/2018   Chronic renal disease, stage III ([place] 07/05/2018   Chronic left shoulder pain 07/05/2018   Hypothyroidism 02/06/2018   Hepatitis C virus infection cured after antiviral drug therapy 02/04/2018   Osteoarthritis of right knee 07/30/2017   S/P laparoscopic sleeve gastrectomy July 2018 08/26/2016   Preop cardiovascular exam 07/30/2016   Dyspnea on exertion 07/30/2016   Class 2 severe obesity due to excess calories with serious comorbidity and body mass index (BMI) of 39.0 to 39.9 in adult ([place] 07/30/2016   Bilateral lower extremity edema 07/30/2016    Immunization History  Administered Date(s) Administered   Fluad Quad(high Dose 65+) 11/10/2018, 11/29/2019   Influenza, High Dose Seasonal PF 12/21/2017, 11/10/2018   Influenza,inj,quad, With Preservative 02/25/2016   Influenza-Unspecified 10/25/2016   Moderna Sars-Covid-2 Vaccination  03/28/2019, 04/25/2019, 01/02/2020, 07/06/2020   Pneumococcal Polysaccharide-23 11/02/2013   Pneumococcal-Unspecified 11/24/2016   Zoster Recombinat (Shingrix) 11/03/2018, 12/31/2018    Conditions to be addressed/monitored:  Hypertension, Hyperlipidemia, and Diabetes  Care Plan : [place] Updates made by PMayford Knife RRussellsince 08/22/2020 12:00 AM     Problem: HTN, HLD, DM II   Priority: High     Long-Range Goal: Disease Management   Start Date: 04/11/2020  Recent Progress: On track  Priority: High  Note:     Current Barriers:  Does not adhere to prescribed medication regimen   Pharmacist Clinical Goal(s):  Over the next 90 days, patient will achieve adherence to monitoring guidelines and medication adherence to achieve therapeutic efficacy through collaboration with PharmD and provider.    Interventions: 1:1 collaboration with SGlendale Chard MD regarding development and update of comprehensive plan of care as evidenced by provider attestation and co-signature Inter-disciplinary care team collaboration (see longitudinal plan of care) Comprehensive medication review performed; medication list updated in electronic medical record  Hypertension (BP goal <130/80) -Uncontrolled -Current treatment: Metoprolol Succinate 25 mg tablet once per day Losartan 100 mg tablet daily -Medications previously tried:  -Current home readings: 162/71, 127/69, 142/70 - checking it before she takes her medication, she uses a wrist cuff  -Current dietary habits:  -Current exercise habits:  -Denies hypotensive/hypertensive symptoms -Educated on BP goals and benefits of medications for prevention of heart attack, stroke and kidney damage; -She reports that some of her higher BP readings are due to housing issues  Daily salt intake goal < 2300 mg; Exercise goal of 150 minutes per week; Importance of home blood pressure monitoring; -Counseled to monitor BP at home at least  once per day, document, and provide log at future appointments -Counseled on diet and exercise extensively -Patient reports that she is doing well with the metoprolol succinate 25 mg tablet daily.  -Specific instructions were given by the PCP team to the patient, she voiced understanding of these instructions and was comfortable with them.  Recommended to continue current medication   Hyperlipidemia: (LDL goal < 70) -Uncontrolled -Current treatment: Simvastatin 20 mg taking 1 tablet daily  -Current dietary patterns: eating a lot of seafood, baked and broiled. Eating a lot of vegetables.  -Current exercise habits: Doing water aerobics at least three days a week.  -[facility]  -Educated on Cholesterol goals;  Benefits of statin for ASCVD risk reduction; Importance of limiting foods high in cholesterol; -Recommended to continue current medication  Diabetes (A1c goal <7%) -controlled -Current medications: Ozempic 1 mg - once a week on Saturday  Tresiba 100 unit/ml - inject 10 units at night  -Current home glucose readings fasting glucose: 100-110 post prandial glucose: none reported during this visit. -Denies hypoglycemic/hyperglycemic symptoms -Current meal patterns:  Breakfast: toast with a tbsp of peanut butter, croissant  lunch: salad, ravioli and soup - small portions  dinner: salad, ravioli, and soup - small portions  snacks: Pop-Secret 100 calories per serving drinks: green [place] diet tea, ocean spray zero calories, propel - black cherry  -Current exercise: patient reports that she is going to resume exercising at least 2-3 times per week using a set of exercises given to her by a therapist.  She also does 10 minutes of jogging in the water and 10 minutes afterward. She is usually in the water for at least 40 minutes.  -Educated on A1c and blood sugar goals; Complications of diabetes including kidney damage, retinal damage, and  cardiovascular disease; Benefits of weight loss; Carbohydrate counting and/or plate method -Counseled to check feet daily and get yearly eye exams -Counseled on the importance of checking blood sugar twice per day.  Recommended patient closely monitor the amount of carbs that she has with each serving.  Educated on the importance of drinking water and will be giving her a Nutrition in the fast lane hand out.   Health Maintenance: Patient reported that she is concerned about her housing situation. Her rent is going up quite a bit and she is concerned. I let her know that I will let CCM social worker know about her concerns. She is okay with this idea.    Patient Goals/Self-Care Activities Over the next 90 days, patient will:  - take medications as prescribed target a minimum of 150 minutes of moderate intensity exercise weekly  Follow Up Plan:   The patient has been provided with contact information for the care management team and has been advised  to call with any health related questions or concerns.       Medication Assistance:  Ozempic obtained through [place] [employer] medication assistance program.  Enrollment ends 01/2020  Compliance/Adherence/Medication fill history: Care Gaps: PCV-13  Star-Rating Drugs: Ozempic 1 mg Simvastatin 20 mg Losartan 100 mg   Patient's preferred [facility] [place] [address] [facility] [address] [place] [address] Phone: [phone] Fax: [place], [address]. [place]. [place] [address] Phone: [phone] Fax: [phone]  Uses pill box? Yes Pt endorses 99% compliance  We discussed: Benefits of medication synchronization, packaging and delivery as well as enhanced pharmacist oversight with [employer]. Patient decided to: Continue current medication management strategy  Care  Plan and Follow Up Patient Decision:  Patient agrees to Care Plan and Follow-up.  Plan: The patient has been provided with contact information for the care management team and has been advised to call with any health related questions or concerns.   [name], PharmD Clinical Pharmacist [facility] [phone]

## 2020-08-16 ENCOUNTER — Telehealth: Payer: Self-pay

## 2020-08-16 NOTE — Telephone Encounter (Signed)
  Care Management   Follow Up Note   08/16/2020 Name: Teresa Golden MRN: [id number] DOB: [birth year]   Referred by: Glendale Chard, MD Reason for referral : Chronic Care Management (Unsuccessful call)   SW placed an unsuccessful outbound call to the patient in response to communication received by PharmD indicating patient is in need of financial resources due to an increase in rent. SW left a HIPAA compliant voice message requesting a return call.  Follow Up Plan:  SW will attempt to contact the patient again over the next 14 days if a return call is not received.  Daneen Schick, BSW, CDP Social Worker, Certified Dementia Practitioner [facility] [phone]

## 2020-08-17 ENCOUNTER — Ambulatory Visit: Payer: Medicare HMO

## 2020-08-17 DIAGNOSIS — Z794 Long term (current) use of insulin: Secondary | ICD-10-CM

## 2020-08-17 DIAGNOSIS — E1122 Type 2 diabetes mellitus with diabetic chronic kidney disease: Secondary | ICD-10-CM | POA: Diagnosis not present

## 2020-08-17 DIAGNOSIS — I129 Hypertensive chronic kidney disease with stage 1 through stage 4 chronic kidney disease, or unspecified chronic kidney disease: Secondary | ICD-10-CM | POA: Diagnosis not present

## 2020-08-17 DIAGNOSIS — N183 Chronic kidney disease, stage 3 unspecified: Secondary | ICD-10-CM | POA: Diagnosis not present

## 2020-08-17 NOTE — Chronic Care Management (AMB) (Signed)
Chronic Care Management    Social Work Note  08/17/2020 Name: Teresa Golden MRN: [id number] DOB: 11-07-1945  Teresa Golden is a 75 y.o. year old female who is a primary care patient of Glendale Chard, MD. The CCM team was consulted to assist the patient with chronic disease management and/or care coordination needs related to: [employer] .   Engaged with patient by telephone for follow up visit in response to provider referral for social work chronic care management and care coordination services.   Consent to Services:  The patient was given information about Chronic Care Management services, agreed to services, and gave verbal consent prior to initiation of services.  Please see initial visit note for detailed documentation.   Patient agreed to services and consent obtained.   Assessment: Review of patient past medical history, allergies, medications, and health status, including review of relevant consultants reports was performed today as part of a comprehensive evaluation and provision of chronic care management and care coordination services.     SDOH (Social Determinants of Health) assessments and interventions performed:    Advanced Directives Status: Not addressed in this encounter.  CCM Care Plan  Allergies  Allergen Reactions   Meloxicam Other (See Comments)    Fever; muscle aches; "flu-like" symptoms   Other     Rose fever and hay fever     Outpatient Encounter Medications as of 08/17/2020  Medication Sig Note   amLODipine (NORVASC) 5 MG tablet Take 1 tablet (5 mg total) by mouth daily.    B Complex-C (B-COMPLEX WITH VITAMIN C) tablet Take 1 tablet by mouth daily.    Biotin 10000 MCG TABS Take 10,000 mcg by mouth daily.    Cholecalciferol 5000 units TABS Take 5,000 Units by mouth daily.    Colchicine 0.6 MG CAPS TAKE 1 CAPSULE DAILY AS    NEEDED (Patient taking differently: Take 0.6 mg by mouth daily as needed (gout).) 06/20/2020: Patient takes as needed    Cranberry-Vitamin C 250-60 MG CAPS Take 1 tablet by mouth daily.    diclofenac Sodium (VOLTAREN) 1 % GEL APPLY 2 GRAMS TO AFFECTED AREA(S) 4 TIMES DAILY AS NEEDED (Patient taking differently: Apply 2 g topically 4 (four) times daily as needed (pain).)    enoxaparin (LOVENOX) 40 MG/0.4ML injection Inject 40 mg into the skin daily. Taking for 7 days after procedure 07/19/2020: After procedure   gabapentin (NEURONTIN) 300 MG capsule Take 600 mg by mouth at bedtime. Pt taking 600 MG at bedtime    glucose blood (ONETOUCH VERIO) test strip Use as instructed to check blood sugars 2 times per day dx: e11.65    insulin degludec (TRESIBA FLEXTOUCH) 100 UNIT/ML SOPN FlexTouch Pen Inject 0.09 mLs (9 Units total) into the skin daily at 10 pm. (Patient taking differently: Inject 12 Units into the skin daily at 10 pm.)    losartan (COZAAR) 100 MG tablet Take 1 tablet (100 mg total) by mouth daily.    metoprolol succinate (TOPROL-XL) 25 MG 24 hr tablet 1 tablet by mouth daily at bedtime    Multiple Vitamins-Minerals (MULTIVITAMIN WITH MINERALS) tablet Take 1 tablet by mouth daily. Women's 50+    naproxen sodium (ANAPROX) 220 MG tablet Take 440 mg by mouth at bedtime. 2 tablets every night at bedtime    ondansetron (ZOFRAN) 4 MG tablet Take 1 tablet (4 mg total) by mouth every 8 (eight) hours as needed for nausea or vomiting. 07/19/2020: For after procedure    OneTouch Delica Lancets 01X  MISC Use as instructed to check blood sugars 2 times per day dx: e11.65    OZEMPIC, 1 MG/DOSE, 2 MG/1.5ML SOPN INJECT INTO SKIN ONCE A WEEK (Patient taking differently: Inject 1 mg into the skin every Saturday. 1mg )    simvastatin (ZOCOR) 10 MG tablet Take 1 tablet (10 mg total) by mouth daily.    SYNTHROID 25 MCG tablet Take 0.5 tablet (12.5 mcg) by mouth on Mondays through Fridays, then take 1 tablet (25 mcg) by mouth on Saturdays and Sundays. (Patient taking differently: Take 12.5-25 mcg by mouth See admin instructions. Take 0.5  tablet (12.5 mcg) by mouth on Mondays through Fridays, then take 1 tablet (25 mcg) by mouth on Saturdays and Sundays.)    No facility-administered encounter medications on file as of 08/17/2020.    Patient Active Problem List   Diagnosis Date Noted   Type 2 diabetes mellitus with stage 3 chronic kidney disease, with long-term current use of insulin ([place]) 11/29/2019   Panniculitis 09/30/2019   Back pain 09/30/2019   Hepatoma ([place]) 01/21/2019   Other cirrhosis of liver ([place]) 07/05/2018   Hypertensive nephropathy 07/05/2018   Chronic renal disease, stage III ([place]) 07/05/2018   Chronic left shoulder pain 07/05/2018   Hypothyroidism 02/06/2018   Hepatitis C virus infection cured after antiviral drug therapy 02/04/2018   Osteoarthritis of right knee 07/30/2017   S/P laparoscopic sleeve gastrectomy July 2018 08/26/2016   Preop cardiovascular exam 07/30/2016   Dyspnea on exertion 07/30/2016   Class 2 severe obesity due to excess calories with serious comorbidity and body mass index (BMI) of 39.0 to 39.9 in adult ([place]) 07/30/2016   Bilateral lower extremity edema 07/30/2016    Conditions to be addressed/monitored: HTN, DMII, and CKD Stage III ; Housing barriers  Care Plan : Social Work Care Plan  Updates made by Daneen Schick since 08/17/2020 12:00 AM     Problem: Housing      Long-Range Goal: Housing Opportunities Identified   Start Date: 08/17/2020  Expected End Date: 12/15/2020  Priority: High  Note:   Current Barriers:  Chronic disease management support and education needs related to HTN, DM, and CKD Stage III   Recent increase in rent causing anxiety related to future increases Limited housing options in the area within budget  Social Worker Clinical Goal(s):  patient will work with SW to identify and address any acute and/or chronic care coordination needs related to the self health management of HTN, DM, and CKD Stage III   Patient will work with SW to identify future  opportunities for housing  SW Interventions:  Inter-disciplinary care team collaboration (see longitudinal plan of care) Collaboration with Glendale Chard, MD regarding development and update of comprehensive plan of care as evidenced by provider attestation and co-signature Collaboration with colleague [name] who reports patients rent is increasing and she may be in need of financial resources Spoke with the patient who indicates her rent has increased by $200 per month - patient has signed updated lease as she did not have any other housing options at the time Discussed housing barriers throughout the community including limited apartments for seniors as well as long wait lists Patient reports she is over the income limit to qualify for low income housing but has 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) where rent is based on income Patient is currently working on application and has an appointment to meet with the apartment managed on 7/6 to submit paperwork and have her name added to  the wait list- patient reports wait list is 6 months - 1 year Discussed patient no longer plans to attend school in the fall and instead may look for a part time job to help save money for future housing needs - patient reports she may look into the price of purchasing a home or condo rather than renting due to high increases in rent Assessed for patient need for resources such as food pantry's to allow her to save money to put towards increased rent pricing - at this time patient reports she is able to cover the difference and is not in need of resources Encouraged the patient to contact SW as needed if resources are desired  Patient Goals/Self-Care Activities patient will:   -  Attend upcoming appointment on 7/6 with [place] -Contact SW as needed prior to next scheduled call  Follow Up Plan: The care management team will reach out to the patient again over the next 45 days.                             Follow Up Plan: SW will follow up with patient by phone over the next 45 days      Daneen Schick, BSW, CDP Social Worker, Certified Dementia Practitioner [facility] [phone]  Total time spent performing care coordination and/or care management activities with the patient by phone or face to face = 45 minutes.

## 2020-08-17 NOTE — Patient Instructions (Signed)
Social Worker Visit Information  Goals we discussed today:   Goals Addressed             This Visit's Progress    Housing Opportunities Identified       Timeframe:  Long-Range Goal Priority:  High Start Date:   6.24.22                          Expected End Date: 10.22.22                       Next planned outreach: 7.26.22  Patient Goals/Self-Care Activities patient will:   - Attend upcoming appointment on 7/6 with [place] -Contact SW as needed prior to next scheduled call          Materials Provided: Verbal education about housing barriers provided by phone  Follow Up Plan: SW will follow up with patient by phone over the next 45 days   Daneen Schick, BSW, CDP Social Worker, Certified Dementia Practitioner [facility] [phone]

## 2020-08-20 ENCOUNTER — Ambulatory Visit (INDEPENDENT_AMBULATORY_CARE_PROVIDER_SITE_OTHER): Payer: Medicare HMO | Admitting: Addiction (Substance Use Disorder)

## 2020-08-20 DIAGNOSIS — F4323 Adjustment disorder with mixed anxiety and depressed mood: Secondary | ICD-10-CM

## 2020-08-20 NOTE — Progress Notes (Signed)
      [facility] Counselor/Therapist Progress Note  Patient ID: CHELCEA ZAHN, MRN: [id number],    Date: 08/20/2020   Time Spent:  68mins  Treatment Type: Individual Therapy  Reported Symptoms:  concerned/ anxious  Mental Status Exam:  Appearance:   Well Groomed     Behavior:  Appropriate and Sharing  Motor:  Normal  Speech/Language:   Clear and Coherent  Affect:  Appropriate  Mood:  anxious  Thought process:  normal  Thought content:    Rumination  Sensory/Perceptual disturbances:    WNL  Orientation:  x4  Attention:  Good  Concentration:  Good  Memory:  WNL  Fund of knowledge:   Good  Insight:    Good  Judgment:   Fair  Impulse Control:  Good   Risk Assessment: Danger to Self:  No Self-injurious Behavior: No Danger to Others: No Duty to Warn:no Physical Aggression / Violence:No  Access to Firearms a concern: No  Gang Involvement:No    Subjective: Client reported "I wish I could wave a wand and get me through the recovery period." Client having a surgery in August that she will have help from a friend to help her recover in. Client also concerned about her housing situation and finding a lower income income-based housing that she qualifies for. Client concerned she wont also have time to move everything before her surgery the beginning of August. Therapist used MI & RPT with client to validate client's feelings and fears and help support her as she processed her concerns/feelings throughout the session. Client processed her motivation for the surgery and what she looks forward to physically. Therapist assessed for stability and client denied SI/HI/AVH.   Interventions: Motivational Interviewing and RPT    Diagnosis:   ICD-10-CM   1. Adjustment disorder with mixed anxiety and depressed mood  F43.23        Plan of Care:  Client is to return to therapy with therapist every 1-2 weeks as needed to process traumas/ frustrations in a safe space, to be re-evaluated in 3  months.  Client is to practice mindfulness AEB daily meditation and body scans or as needed when flooded by emotion/pain.  Client is to learn/practice DBT wise mind & radical acceptance. Client is to practice self-compassion AEB being gentle with themselves, utilizing self-care techniques daily or as needed when grieving something in the moment.  Client is to process grief/pain of their loss in a somatic body-felt sense way: ie using mindfulness, brainspotting, or trauma release as a method for releasing body pain/tension caused by grief.  Barnie Del, LCSW, LCAS, CCTP, CCS-I, BSP

## 2020-08-21 ENCOUNTER — Other Ambulatory Visit: Payer: Self-pay | Admitting: Internal Medicine

## 2020-08-22 ENCOUNTER — Ambulatory Visit: Payer: Medicare HMO | Admitting: Internal Medicine

## 2020-08-22 DIAGNOSIS — N1831 Chronic kidney disease, stage 3a: Secondary | ICD-10-CM | POA: Diagnosis not present

## 2020-08-22 DIAGNOSIS — E1122 Type 2 diabetes mellitus with diabetic chronic kidney disease: Secondary | ICD-10-CM | POA: Diagnosis not present

## 2020-08-22 DIAGNOSIS — Z794 Long term (current) use of insulin: Secondary | ICD-10-CM | POA: Diagnosis not present

## 2020-08-22 DIAGNOSIS — I129 Hypertensive chronic kidney disease with stage 1 through stage 4 chronic kidney disease, or unspecified chronic kidney disease: Secondary | ICD-10-CM | POA: Diagnosis not present

## 2020-08-22 NOTE — Patient Instructions (Signed)
Visit Information It was great speaking with you today!  Please let me know if you have any questions about our visit.   Goals Addressed             This Visit's Progress    Manage My Medicine       Timeframe:  Long-Range Goal Priority:  High Start Date:    04/11/2020                         Expected End Date:                       Follow Up Date 12/13/2020   - call for medicine refill 2 or 3 days before it runs out - call if I am sick and can't take my medicine - keep a list of all the medicines I take; vitamins and herbals too - use an alarm clock or phone to remind me to take my medicine    Why is this important?   These steps will help you keep on track with your medicines.            Patient Care Plan: CCM Pharmacy Care Plan     Problem Identified: HTN, HLD, DM II   Priority: High     Long-Range Goal: Disease Management   Start Date: 04/11/2020  Recent Progress: On track  Priority: High  Note:     Current Barriers:  Does not adhere to prescribed medication regimen   Pharmacist Clinical Goal(s):  Over the next 90 days, patient will achieve adherence to monitoring guidelines and medication adherence to achieve therapeutic efficacy through collaboration with PharmD and provider.    Interventions: 1:1 collaboration with Glendale Chard, MD regarding development and update of comprehensive plan of care as evidenced by provider attestation and co-signature Inter-disciplinary care team collaboration (see longitudinal plan of care) Comprehensive medication review performed; medication list updated in electronic medical record   Hypertension (BP goal <130/80) -Uncontrolled -Current treatment: Metoprolol Succinate 25 mg tablet once per day Losartan 100 mg tablet daily -Medications previously tried:  -Current home readings: 162/71, 127/69, 142/70 - checking it before she takes her medication, she uses a wrist cuff  -Current dietary habits:  -Current  exercise habits:  -Denies hypotensive/hypertensive symptoms -Educated on BP goals and benefits of medications for prevention of heart attack, stroke and kidney damage; -She reports that some of her higher BP readings are due to housing issues  Daily salt intake goal < 2300 mg; Exercise goal of 150 minutes per week; Importance of home blood pressure monitoring; -Counseled to monitor BP at home at least once per day, document, and provide log at future appointments -Counseled on diet and exercise extensively -Patient reports that she is doing well with the metoprolol succinate 25 mg tablet daily.  -Specific instructions were given by the PCP team to the patient, she voiced understanding of these instructions and was comfortable with them.  Recommended to continue current medication   Hyperlipidemia: (LDL goal < 70) -Uncontrolled -Current treatment: Simvastatin 20 mg taking 1 tablet daily  -Current dietary patterns: eating a lot of seafood, baked and broiled. Eating a lot of vegetables.  -Current exercise habits: Doing water aerobics at least three days a week.  -[facility]  -Educated on Cholesterol goals;  Benefits of statin for ASCVD risk reduction; Importance of limiting foods high in cholesterol; -Recommended to  continue current medication  Diabetes (A1c goal <7%) -controlled -Current medications: Ozempic 1 mg - once a week on Saturday  Tresiba 100 unit/ml - inject 10 units at night  -Current home glucose readings fasting glucose: 100-110 post prandial glucose: none reported during this visit. -Denies hypoglycemic/hyperglycemic symptoms -Current meal patterns:  Breakfast: toast with a tbsp of peanut butter, croissant  lunch: salad, ravioli and soup - small portions  dinner: salad, ravioli, and soup - small portions  snacks: Pop-Secret 100 calories per serving drinks: green [place] diet tea, ocean spray zero calories, propel - black  cherry  -Current exercise: patient reports that she is going to resume exercising at least 2-3 times per week using a set of exercises given to her by a therapist.  She also does 10 minutes of jogging in the water and 10 minutes afterward. She is usually in the water for at least 40 minutes.  -Educated on A1c and blood sugar goals; Complications of diabetes including kidney damage, retinal damage, and cardiovascular disease; Benefits of weight loss; Carbohydrate counting and/or plate method -Counseled to check feet daily and get yearly eye exams -Counseled on the importance of checking blood sugar twice per day.  Recommended patient closely monitor the amount of carbs that she has with each serving.  Educated on the importance of drinking water and will be giving her a Nutrition in the fast lane hand out.   Health Maintenance: Patient reported that she is concerned about her housing situation. Her rent is going up quite a bit and she is concerned. I let her know that I will let CCM social worker know about her concerns. She is okay with this idea.    Patient Goals/Self-Care Activities Over the next 90 days, patient will:  - take medications as prescribed target a minimum of 150 minutes of moderate intensity exercise weekly  Follow Up Plan:   The patient has been provided with contact information for the care management team and has been advised to call with any health related questions or concerns.        Patient agreed to services and verbal consent obtained.   The patient verbalized understanding of instructions, educational materials, and care plan provided today and agreed to receive a mailed copy of patient instructions, educational materials, and care plan.   [name], PharmD Clinical Pharmacist [facility] [phone]

## 2020-08-28 ENCOUNTER — Encounter: Payer: Medicare HMO | Admitting: Plastic Surgery

## 2020-08-28 ENCOUNTER — Telehealth: Payer: Medicare HMO

## 2020-09-04 ENCOUNTER — Encounter: Payer: Self-pay | Admitting: Surgical

## 2020-09-04 ENCOUNTER — Ambulatory Visit (INDEPENDENT_AMBULATORY_CARE_PROVIDER_SITE_OTHER): Payer: Medicare HMO | Admitting: Surgical

## 2020-09-04 ENCOUNTER — Other Ambulatory Visit: Payer: Self-pay

## 2020-09-04 ENCOUNTER — Telehealth: Payer: Self-pay

## 2020-09-04 VITALS — BP 172/75 | HR 58 | Ht 64.0 in | Wt 213.4 lb

## 2020-09-04 DIAGNOSIS — M545 Low back pain, unspecified: Secondary | ICD-10-CM

## 2020-09-04 DIAGNOSIS — M793 Panniculitis, unspecified: Secondary | ICD-10-CM

## 2020-09-04 DIAGNOSIS — G8929 Other chronic pain: Secondary | ICD-10-CM

## 2020-09-04 NOTE — H&P (View-Only) (Signed)
[name], female    DOB: 12-07-1945, 75 y.o.   MRN: [id number]  Chief Complaint  Patient presents with   Pre-op Exam      ICD-10-CM   1. Panniculitis  M79.3     2. Chronic bilateral low back pain without sciatica  M54.50    G89.29        History of Present Illness: Teresa Golden is a 75 y.o.  female  with a history of panniculitis.  She presents for preoperative evaluation for upcoming procedure, panniculectomy, scheduled for 09/26/2020 with Dr. Marla Roe.  The patient has not had problems with anesthesia. No history of DVT/PE.  No family history of DVT/PE.  No family or personal history of bleeding or clotting disorders.  Patient is not currently taking any blood thinners.  No history of CVA/MI.   PMH Significant for: Adjustment disorder, type 2 diabetes on insulin, stage III chronic kidney disease, hypertensive nephropathy, liver cirrhosis, history of hepatitis C, hyperlipidemia, history of gastric sleeve.  Patient previously had cardiac clearance.  Per cardiology patient is intermediate risk for intermediate risk procedure.  No further testing required.  If the level of risk is acceptable patient and surgical team patient will be considered optimized per cardiology. Patient is aware of her preoperative cardiac clearance and she is aware that she is at intermediate risk.  She reports today that she is overall doing well.  She reports that she has a friend who will be able to stay with her for a few days after surgery.  She would like to stay overnight for 1 night after surgery.  She would like home health assistance if possible.  She reports no recent fevers, chills, nausea, vomiting, chest pain, shortness of breath.   Past Medical History: Allergies: Allergies  Allergen Reactions   Meloxicam Other (See Comments)    Fever; muscle aches; "flu-like" symptoms   Other     Rose fever and hay fever     Current Medications:  Current Outpatient Medications:     amLODipine (NORVASC) 5 MG tablet, Take 1 tablet by mouth once daily, Disp: 90 tablet, Rfl: 0   B Complex-C (B-COMPLEX WITH VITAMIN C) tablet, Take 1 tablet by mouth daily., Disp: , Rfl:    Biotin 10000 MCG TABS, Take 10,000 mcg by mouth daily., Disp: , Rfl:    Cholecalciferol 5000 units TABS, Take 5,000 Units by mouth daily., Disp: , Rfl:    Colchicine 0.6 MG CAPS, TAKE 1 CAPSULE DAILY AS    NEEDED (Patient taking differently: Take 0.6 mg by mouth daily as needed (gout).), Disp: 90 capsule, Rfl: 1   Cranberry-Vitamin C 250-60 MG CAPS, Take 1 tablet by mouth daily., Disp: , Rfl:    diclofenac Sodium (VOLTAREN) 1 % GEL, APPLY 2 GRAMS TO AFFECTED AREA(S) 4 TIMES DAILY AS NEEDED (Patient taking differently: Apply 2 g topically 4 (four) times daily as needed (pain).), Disp: 200 g, Rfl: 2   enoxaparin (LOVENOX) 40 MG/0.4ML injection, Inject 40 mg into the skin daily. Taking for 7 days after procedure, Disp: , Rfl:    gabapentin (NEURONTIN) 300 MG capsule, Take 600 mg by mouth at bedtime. Pt taking 600 MG at bedtime, Disp: , Rfl:    glucose blood (ONETOUCH VERIO) test strip, Use as instructed to check blood sugars 2 times per day dx: e11.65, Disp: 300 each, Rfl: 2   insulin degludec (TRESIBA FLEXTOUCH) 100 UNIT/ML SOPN FlexTouch Pen, Inject 0.09 mLs (9 Units total) into  the skin daily at 10 pm. (Patient taking differently: Inject 12 Units into the skin daily at 10 pm.), Disp: 5 pen, Rfl: 2   metoprolol succinate (TOPROL-XL) 25 MG 24 hr tablet, 1 tablet by mouth daily at bedtime, Disp: 90 tablet, Rfl: 1   Multiple Vitamins-Minerals (MULTIVITAMIN WITH MINERALS) tablet, Take 1 tablet by mouth daily. Women's 50+, Disp: , Rfl:    naproxen sodium (ANAPROX) 220 MG tablet, Take 440 mg by mouth at bedtime. 2 tablets every night at bedtime, Disp: , Rfl:    ondansetron (ZOFRAN) 4 MG tablet, Take 1 tablet (4 mg total) by mouth every 8 (eight) hours as needed for nausea or vomiting., Disp: 20 tablet, Rfl: 0   OneTouch  Delica Lancets 85O MISC, Use as instructed to check blood sugars 2 times per day dx: e11.65, Disp: 300 each, Rfl: 2   OZEMPIC, 1 MG/DOSE, 2 MG/1.5ML SOPN, INJECT INTO SKIN ONCE A WEEK (Patient taking differently: Inject 1 mg into the skin every Saturday. 1mg ), Disp: 9 pen, Rfl: 1   simvastatin (ZOCOR) 10 MG tablet, Take 1 tablet (10 mg total) by mouth daily., Disp: 90 tablet, Rfl: 1   SYNTHROID 25 MCG tablet, Take 0.5 tablet (12.5 mcg) by mouth on Mondays through Fridays, then take 1 tablet (25 mcg) by mouth on Saturdays and Sundays. (Patient taking differently: Take 12.5-25 mcg by mouth See admin instructions. Take 0.5 tablet (12.5 mcg) by mouth on Mondays through Fridays, then take 1 tablet (25 mcg) by mouth on Saturdays and Sundays.), Disp: 90 tablet, Rfl: 1   losartan (COZAAR) 100 MG tablet, Take 1 tablet (100 mg total) by mouth daily., Disp: 90 tablet, Rfl: 2  Past Medical Problems: Past Medical History:  Diagnosis Date   Arthritis    Chronic kidney disease    self reports ckd stage 3    Diabetes mellitus, type II, insulin dependent ([place])    With neurologic complications. 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    Hypertensive nephropathy 07/05/2018   Hypothyroidism    Hypothyroidism 02/06/2018   Morbid obesity with BMI of 50.0-59.9, adult ([place])    Scoliosis     Past Surgical History: Past Surgical History:  Procedure Laterality Date   ABDOMINAL HYSTERECTOMY     uterus   CHOLECYSTECTOMY     EYE SURGERY     cataract extraction bilateral    JOINT REPLACEMENT     Left knee   KNEE ARTHROPLASTY Right 07/30/2017   Procedure: RIGHT TOTAL KNEE ARTHROPLASTY WITH COMPUTER NAVIGATION;  Surgeon: Rod Can, MD;  Location: [facility];  Service:  Orthopedics;  Laterality: Right;  Needs RNFA   LAPAROSCOPIC GASTRIC BANDING     and reversal   LAPAROSCOPIC GASTRIC SLEEVE RESECTION N/A 08/26/2016   Procedure: LAPAROSCOPIC GASTRIC SLEEVE RESECTION WITH UPPER ENOD;  Surgeon: Johnathan Hausen, MD;  Location: [facility];  Service: General;  Laterality: N/A;   TONSILLECTOMY     TRANSTHORACIC ECHOCARDIOGRAM  11/2013   EF 65-70%. Normal diastolic Fxn.  Normal Valves.    Social History: Social History   Socioeconomic History   Marital status: Single    Spouse name: Not on file   Number of children: Not on file   Years of education:  Not on file   Highest education level: Not on file  Occupational History   Occupation: retired  Tobacco Use   Smoking status: Former    Packs/day: 0.25    Years: 10.00    Pack years: 2.50    Types: Cigarettes   Smokeless tobacco: Never   Tobacco comments:    quit 20 years ago  Vaping Use   Vaping Use: Never used  Substance and Sexual Activity   Alcohol use: Not Currently    Alcohol/week: 1.0 standard drink    Types: 1 Glasses of wine per week    Comment: occasional   Drug use: No   Sexual activity: Yes  Other Topics Concern   Not on file  Social History Narrative   Not on file   Social Determinants of Health   Financial Resource Strain: High Risk   Difficulty of Paying Living Expenses: Very hard  Food Insecurity: Not on file  Transportation Needs: Not on file  Physical Activity: Not on file  Stress: Not on file  Social Connections: Not on file  Intimate Partner Violence: Not on file    Family History: Family History  Problem Relation Age of Onset   Heart attack Mother    Heart failure Mother    Stroke Father     Review of Systems: Review of Systems  Constitutional: Negative.   Respiratory: Negative.    Cardiovascular: Negative.   Gastrointestinal: Negative.   Genitourinary: Negative.   Neurological: Negative.    Physical Exam: Vital Signs BP (!) 172/75 (BP Location: Left Arm,  Patient Position: Sitting, Cuff Size: Large)   Pulse (!) 58   Ht 5\' 4"  (1.626 m)   Wt 213 lb 6.4 oz (96.8 kg)   SpO2 97%   BMI 36.63 kg/m   Physical Exam Constitutional:      General: Not in acute distress.    Appearance: Normal appearance. Not ill-appearing.  HENT:     Head: Normocephalic and atraumatic.  Eyes:     Pupils: Pupils are equal, round Neck:     Musculoskeletal: Normal range of motion.  Cardiovascular:     Rate and Rhythm: Normal rate    Pulses: Normal pulses.  Pulmonary:     Effort: Pulmonary effort is normal. No respiratory distress.  Abdominal:     General: Abdomen is flat. There is no distension.  Musculoskeletal: Normal range of motion.  Skin:    General: Skin is warm and dry.     Findings: No erythema or rash.  Neurological:     General: No focal deficit present.     Mental Status: Alert and oriented to person, place, and time. Mental status is at baseline.     Motor: No weakness.  Psychiatric:        Mood and Affect: Mood normal.        Behavior: Behavior normal.    Assessment/Plan: The patient is scheduled for panniculectomy with Dr. Marla Roe.  Risks, benefits, and alternatives of procedure discussed, questions answered and consent obtained.    Smoking Status: Former smoker; Counseling Given?  N/A  Caprini Score: 7, high; Risk Factors include: Age, BMI greater than 25, insulin-dependent diabetes and length of planned surgery. Recommendation for mechanical and pharmacological prophylaxis. Encourage early ambulation.  Postoperative Lovenox sent to pharmacy for 7 days.  Post-op Rx sent to pharmacy: Patient previously prescribed Norco, Zofran, Keflex, Lovenox.  She confirms that she has these prescriptions.  Patient was provided with the General Surgical Risk consent document and Pain  Medication Agreement prior to their appointment.  They had adequate time to read through the risk consent documents and Pain Medication Agreement. We also discussed them  in person together during this preop appointment. All of their questions were answered to their satisfaction.  Recommended calling if they have any further questions.  Risk consent form and Pain Medication Agreement to be scanned into patient's chart.  The risk that can be encountered for this procedure were discussed and include the following but not limited to these: asymmetry, fluid accumulation, firmness of the tissue, skin loss, decrease or no sensation, fat necrosis, bleeding, infection, healing delay.  Deep vein thrombosis, cardiac and pulmonary complications are risks to any procedure.  There are risks of anesthesia, changes to skin sensation and injury to nerves or blood vessels.  The muscle can be temporarily or permanently injured.  You may have an allergic reaction to tape, suture, glue, blood products which can result in skin discoloration, swelling, pain, skin lesions, poor healing.  Any of these can lead to the need for revisonal surgery or stage procedures.  Weight gain and weigh loss can also effect the long term appearance. The results are not guaranteed to last a lifetime.  Future surgery may be required.    Patient is understanding that her umbilicus may be excised during the procedure and she reports that she is comfortable with this and she reports "I have no need for it".  She is understanding that she is at an increased risk of postoperative complications including delayed healing, postoperative wounds, need for additional procedures given her diabetes and chronic kidney disease.  I discussed with the patient today that we have quested assistance from multiple home health agencies, however they have all declined due to shortstaffed or inability to provide help.  I discussed with her that I would resubmit to [facility] home [facility] today but that I could not guarantee that she would receive assistance from them.  She was understanding of this.   Electronically signed by: Carola Rhine  Jazion Atteberry, PA-C 09/04/2020 11:37 AM

## 2020-09-04 NOTE — Telephone Encounter (Signed)
Per Matt's request, refaxed [place] application to [facility] to be reviewed again and see if they would reconsider to provide service.

## 2020-09-04 NOTE — Chronic Care Management (AMB) (Signed)
Chronic Care Management Pharmacy Assistant   Name: ESLI CLEMENTS  MRN: [id number] DOB: 1945-09-04  Reason for Encounter: Patient Assistance Coordination  09/04/2020- Reorder form filled out for [employer] with [employer] patient assistance program. Printed and awaiting provider signature to fax.  09/06/2020- Faxed signed reorder form to [employer] patient assistance program.    Medications: Outpatient Encounter Medications as of 09/04/2020  Medication Sig Note   amLODipine (NORVASC) 5 MG tablet Take 1 tablet by mouth once daily    B Complex-C (B-COMPLEX WITH VITAMIN C) tablet Take 1 tablet by mouth daily.    Biotin 10000 MCG TABS Take 10,000 mcg by mouth daily.    Cholecalciferol 5000 units TABS Take 5,000 Units by mouth daily.    Colchicine 0.6 MG CAPS TAKE 1 CAPSULE DAILY AS    NEEDED (Patient taking differently: Take 0.6 mg by mouth daily as needed (gout).) 06/20/2020: Patient takes as needed   Cranberry-Vitamin C 250-60 MG CAPS Take 1 tablet by mouth daily.    diclofenac Sodium (VOLTAREN) 1 % GEL APPLY 2 GRAMS TO AFFECTED AREA(S) 4 TIMES DAILY AS NEEDED (Patient taking differently: Apply 2 g topically 4 (four) times daily as needed (pain).)    enoxaparin (LOVENOX) 40 MG/0.4ML injection Inject 40 mg into the skin daily. Taking for 7 days after procedure 07/19/2020: After procedure   gabapentin (NEURONTIN) 300 MG capsule Take 600 mg by mouth at bedtime. Pt taking 600 MG at bedtime    glucose blood (ONETOUCH VERIO) test strip Use as instructed to check blood sugars 2 times per day dx: e11.65    insulin degludec (TRESIBA FLEXTOUCH) 100 UNIT/ML SOPN FlexTouch Pen Inject 0.09 mLs (9 Units total) into the skin daily at 10 pm. (Patient taking differently: Inject 12 Units into the skin daily at 10 pm.)    losartan (COZAAR) 100 MG tablet Take 1 tablet (100 mg total) by mouth daily.    metoprolol succinate (TOPROL-XL) 25 MG 24 hr tablet 1 tablet by mouth daily at bedtime    Multiple  Vitamins-Minerals (MULTIVITAMIN WITH MINERALS) tablet Take 1 tablet by mouth daily. Women's 50+    naproxen sodium (ANAPROX) 220 MG tablet Take 440 mg by mouth at bedtime. 2 tablets every night at bedtime    ondansetron (ZOFRAN) 4 MG tablet Take 1 tablet (4 mg total) by mouth every 8 (eight) hours as needed for nausea or vomiting. 07/19/2020: For after procedure    OneTouch Delica Lancets 93A MISC Use as instructed to check blood sugars 2 times per day dx: e11.65    OZEMPIC, 1 MG/DOSE, 2 MG/1.5ML SOPN INJECT INTO SKIN ONCE A WEEK (Patient taking differently: Inject 1 mg into the skin every Saturday. 1mg )    simvastatin (ZOCOR) 10 MG tablet Take 1 tablet (10 mg total) by mouth daily.    SYNTHROID 25 MCG tablet Take 0.5 tablet (12.5 mcg) by mouth on Mondays through Fridays, then take 1 tablet (25 mcg) by mouth on Saturdays and Sundays. (Patient taking differently: Take 12.5-25 mcg by mouth See admin instructions. Take 0.5 tablet (12.5 mcg) by mouth on Mondays through Fridays, then take 1 tablet (25 mcg) by mouth on Saturdays and Sundays.)    No facility-administered encounter medications on file as of 09/04/2020.    Care Gaps: PNA vac Low Risk Adult Annual Wellness visit scheduled for 09/20/2020  Follow up visit with CCM Pharmacist [name] 12/13/2020 at 2:30 pm   Star Rating Drugs: Losartan 100 mg- Last filled 08-31-2020 90 DS  [facility] Simvastatin 10 mg- Last filled 08-31-2020 90 [facility] Ozempic 1 mg- Patient assistance   [name], [place] Pharmacist Assistant [phone]

## 2020-09-04 NOTE — Progress Notes (Signed)
[name], female    DOB: 1945/11/13, 75 y.o.   MRN: [id number]  Chief Complaint  Patient presents with   Pre-op Exam      ICD-10-CM   1. Panniculitis  M79.3     2. Chronic bilateral low back pain without sciatica  M54.50    G89.29        History of Present Illness: Teresa Golden is a 75 y.o.  female  with a history of panniculitis.  She presents for preoperative evaluation for upcoming procedure, panniculectomy, scheduled for 09/26/2020 with Dr. Marla Roe.  The patient has not had problems with anesthesia. No history of DVT/PE.  No family history of DVT/PE.  No family or personal history of bleeding or clotting disorders.  Patient is not currently taking any blood thinners.  No history of CVA/MI.   PMH Significant for: Adjustment disorder, type 2 diabetes on insulin, stage III chronic kidney disease, hypertensive nephropathy, liver cirrhosis, history of hepatitis C, hyperlipidemia, history of gastric sleeve.  Patient previously had cardiac clearance.  Per cardiology patient is intermediate risk for intermediate risk procedure.  No further testing required.  If the level of risk is acceptable patient and surgical team patient will be considered optimized per cardiology. Patient is aware of her preoperative cardiac clearance and she is aware that she is at intermediate risk.  She reports today that she is overall doing well.  She reports that she has a friend who will be able to stay with her for a few days after surgery.  She would like to stay overnight for 1 night after surgery.  She would like home health assistance if possible.  She reports no recent fevers, chills, nausea, vomiting, chest pain, shortness of breath.   Past Medical History: Allergies: Allergies  Allergen Reactions   Meloxicam Other (See Comments)    Fever; muscle aches; "flu-like" symptoms   Other     Rose fever and hay fever     Current Medications:  Current Outpatient Medications:     amLODipine (NORVASC) 5 MG tablet, Take 1 tablet by mouth once daily, Disp: 90 tablet, Rfl: 0   B Complex-C (B-COMPLEX WITH VITAMIN C) tablet, Take 1 tablet by mouth daily., Disp: , Rfl:    Biotin 10000 MCG TABS, Take 10,000 mcg by mouth daily., Disp: , Rfl:    Cholecalciferol 5000 units TABS, Take 5,000 Units by mouth daily., Disp: , Rfl:    Colchicine 0.6 MG CAPS, TAKE 1 CAPSULE DAILY AS    NEEDED (Patient taking differently: Take 0.6 mg by mouth daily as needed (gout).), Disp: 90 capsule, Rfl: 1   Cranberry-Vitamin C 250-60 MG CAPS, Take 1 tablet by mouth daily., Disp: , Rfl:    diclofenac Sodium (VOLTAREN) 1 % GEL, APPLY 2 GRAMS TO AFFECTED AREA(S) 4 TIMES DAILY AS NEEDED (Patient taking differently: Apply 2 g topically 4 (four) times daily as needed (pain).), Disp: 200 g, Rfl: 2   enoxaparin (LOVENOX) 40 MG/0.4ML injection, Inject 40 mg into the skin daily. Taking for 7 days after procedure, Disp: , Rfl:    gabapentin (NEURONTIN) 300 MG capsule, Take 600 mg by mouth at bedtime. Pt taking 600 MG at bedtime, Disp: , Rfl:    glucose blood (ONETOUCH VERIO) test strip, Use as instructed to check blood sugars 2 times per day dx: e11.65, Disp: 300 each, Rfl: 2   insulin degludec (TRESIBA FLEXTOUCH) 100 UNIT/ML SOPN FlexTouch Pen, Inject 0.09 mLs (9 Units total) into  the skin daily at 10 pm. (Patient taking differently: Inject 12 Units into the skin daily at 10 pm.), Disp: 5 pen, Rfl: 2   metoprolol succinate (TOPROL-XL) 25 MG 24 hr tablet, 1 tablet by mouth daily at bedtime, Disp: 90 tablet, Rfl: 1   Multiple Vitamins-Minerals (MULTIVITAMIN WITH MINERALS) tablet, Take 1 tablet by mouth daily. Women's 50+, Disp: , Rfl:    naproxen sodium (ANAPROX) 220 MG tablet, Take 440 mg by mouth at bedtime. 2 tablets every night at bedtime, Disp: , Rfl:    ondansetron (ZOFRAN) 4 MG tablet, Take 1 tablet (4 mg total) by mouth every 8 (eight) hours as needed for nausea or vomiting., Disp: 20 tablet, Rfl: 0   OneTouch  Delica Lancets 94W MISC, Use as instructed to check blood sugars 2 times per day dx: e11.65, Disp: 300 each, Rfl: 2   OZEMPIC, 1 MG/DOSE, 2 MG/1.5ML SOPN, INJECT INTO SKIN ONCE A WEEK (Patient taking differently: Inject 1 mg into the skin every Saturday. 1mg ), Disp: 9 pen, Rfl: 1   simvastatin (ZOCOR) 10 MG tablet, Take 1 tablet (10 mg total) by mouth daily., Disp: 90 tablet, Rfl: 1   SYNTHROID 25 MCG tablet, Take 0.5 tablet (12.5 mcg) by mouth on Mondays through Fridays, then take 1 tablet (25 mcg) by mouth on Saturdays and Sundays. (Patient taking differently: Take 12.5-25 mcg by mouth See admin instructions. Take 0.5 tablet (12.5 mcg) by mouth on Mondays through Fridays, then take 1 tablet (25 mcg) by mouth on Saturdays and Sundays.), Disp: 90 tablet, Rfl: 1   losartan (COZAAR) 100 MG tablet, Take 1 tablet (100 mg total) by mouth daily., Disp: 90 tablet, Rfl: 2  Past Medical Problems: Past Medical History:  Diagnosis Date   Arthritis    Chronic kidney disease    self reports ckd stage 3    Diabetes mellitus, type II, insulin dependent ([place])    With neurologic complications. 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    Hypertensive nephropathy 07/05/2018   Hypothyroidism    Hypothyroidism 02/06/2018   Morbid obesity with BMI of 50.0-59.9, adult ([place])    Scoliosis     Past Surgical History: Past Surgical History:  Procedure Laterality Date   ABDOMINAL HYSTERECTOMY     uterus   CHOLECYSTECTOMY     EYE SURGERY     cataract extraction bilateral    JOINT REPLACEMENT     Left knee   KNEE ARTHROPLASTY Right 07/30/2017   Procedure: RIGHT TOTAL KNEE ARTHROPLASTY WITH COMPUTER NAVIGATION;  Surgeon: Rod Can, MD;  Location: [facility];  Service:  Orthopedics;  Laterality: Right;  Needs RNFA   LAPAROSCOPIC GASTRIC BANDING     and reversal   LAPAROSCOPIC GASTRIC SLEEVE RESECTION N/A 08/26/2016   Procedure: LAPAROSCOPIC GASTRIC SLEEVE RESECTION WITH UPPER ENOD;  Surgeon: Johnathan Hausen, MD;  Location: [facility];  Service: General;  Laterality: N/A;   TONSILLECTOMY     TRANSTHORACIC ECHOCARDIOGRAM  11/2013   EF 65-70%. Normal diastolic Fxn.  Normal Valves.    Social History: Social History   Socioeconomic History   Marital status: Single    Spouse name: Not on file   Number of children: Not on file   Years of education:  Not on file   Highest education level: Not on file  Occupational History   Occupation: retired  Tobacco Use   Smoking status: Former    Packs/day: 0.25    Years: 10.00    Pack years: 2.50    Types: Cigarettes   Smokeless tobacco: Never   Tobacco comments:    quit 20 years ago  Vaping Use   Vaping Use: Never used  Substance and Sexual Activity   Alcohol use: Not Currently    Alcohol/week: 1.0 standard drink    Types: 1 Glasses of wine per week    Comment: occasional   Drug use: No   Sexual activity: Yes  Other Topics Concern   Not on file  Social History Narrative   Not on file   Social Determinants of Health   Financial Resource Strain: High Risk   Difficulty of Paying Living Expenses: Very hard  Food Insecurity: Not on file  Transportation Needs: Not on file  Physical Activity: Not on file  Stress: Not on file  Social Connections: Not on file  Intimate Partner Violence: Not on file    Family History: Family History  Problem Relation Age of Onset   Heart attack Mother    Heart failure Mother    Stroke Father     Review of Systems: Review of Systems  Constitutional: Negative.   Respiratory: Negative.    Cardiovascular: Negative.   Gastrointestinal: Negative.   Genitourinary: Negative.   Neurological: Negative.    Physical Exam: Vital Signs BP (!) 172/75 (BP Location: Left Arm,  Patient Position: Sitting, Cuff Size: Large)   Pulse (!) 58   Ht 5\' 4"  (1.626 m)   Wt 213 lb 6.4 oz (96.8 kg)   SpO2 97%   BMI 36.63 kg/m   Physical Exam Constitutional:      General: Not in acute distress.    Appearance: Normal appearance. Not ill-appearing.  HENT:     Head: Normocephalic and atraumatic.  Eyes:     Pupils: Pupils are equal, round Neck:     Musculoskeletal: Normal range of motion.  Cardiovascular:     Rate and Rhythm: Normal rate    Pulses: Normal pulses.  Pulmonary:     Effort: Pulmonary effort is normal. No respiratory distress.  Abdominal:     General: Abdomen is flat. There is no distension.  Musculoskeletal: Normal range of motion.  Skin:    General: Skin is warm and dry.     Findings: No erythema or rash.  Neurological:     General: No focal deficit present.     Mental Status: Alert and oriented to person, place, and time. Mental status is at baseline.     Motor: No weakness.  Psychiatric:        Mood and Affect: Mood normal.        Behavior: Behavior normal.    Assessment/Plan: The patient is scheduled for panniculectomy with Dr. Marla Roe.  Risks, benefits, and alternatives of procedure discussed, questions answered and consent obtained.    Smoking Status: Former smoker; Counseling Given?  N/A  Caprini Score: 7, high; Risk Factors include: Age, BMI greater than 25, insulin-dependent diabetes and length of planned surgery. Recommendation for mechanical and pharmacological prophylaxis. Encourage early ambulation.  Postoperative Lovenox sent to pharmacy for 7 days.  Post-op Rx sent to pharmacy: Patient previously prescribed Norco, Zofran, Keflex, Lovenox.  She confirms that she has these prescriptions.  Patient was provided with the General Surgical Risk consent document and Pain  Medication Agreement prior to their appointment.  They had adequate time to read through the risk consent documents and Pain Medication Agreement. We also discussed them  in person together during this preop appointment. All of their questions were answered to their satisfaction.  Recommended calling if they have any further questions.  Risk consent form and Pain Medication Agreement to be scanned into patient's chart.  The risk that can be encountered for this procedure were discussed and include the following but not limited to these: asymmetry, fluid accumulation, firmness of the tissue, skin loss, decrease or no sensation, fat necrosis, bleeding, infection, healing delay.  Deep vein thrombosis, cardiac and pulmonary complications are risks to any procedure.  There are risks of anesthesia, changes to skin sensation and injury to nerves or blood vessels.  The muscle can be temporarily or permanently injured.  You may have an allergic reaction to tape, suture, glue, blood products which can result in skin discoloration, swelling, pain, skin lesions, poor healing.  Any of these can lead to the need for revisonal surgery or stage procedures.  Weight gain and weigh loss can also effect the long term appearance. The results are not guaranteed to last a lifetime.  Future surgery may be required.    Patient is understanding that her umbilicus may be excised during the procedure and she reports that she is comfortable with this and she reports "I have no need for it".  She is understanding that she is at an increased risk of postoperative complications including delayed healing, postoperative wounds, need for additional procedures given her diabetes and chronic kidney disease.  I discussed with the patient today that we have quested assistance from multiple home health agencies, however they have all declined due to shortstaffed or inability to provide help.  I discussed with her that I would resubmit to [facility] home [facility] today but that I could not guarantee that she would receive assistance from them.  She was understanding of this.   Electronically signed by: Carola Rhine  Justyn Langham, PA-C 09/04/2020 11:37 AM

## 2020-09-06 ENCOUNTER — Telehealth: Payer: Self-pay

## 2020-09-06 NOTE — Telephone Encounter (Signed)
Spoke with the manager with [facility] and she verified they do not have any availability due to low staffing for care of patient after patients surgery 09/28/2020.  Tried to call patient to advise, VM was full and was not able to leave a message.

## 2020-09-07 ENCOUNTER — Ambulatory Visit (INDEPENDENT_AMBULATORY_CARE_PROVIDER_SITE_OTHER): Payer: Medicare HMO | Admitting: Addiction (Substance Use Disorder)

## 2020-09-07 ENCOUNTER — Telehealth: Payer: Self-pay | Admitting: Plastic Surgery

## 2020-09-07 ENCOUNTER — Other Ambulatory Visit: Payer: Self-pay

## 2020-09-07 DIAGNOSIS — F4323 Adjustment disorder with mixed anxiety and depressed mood: Secondary | ICD-10-CM

## 2020-09-07 NOTE — Telephone Encounter (Signed)
Patient called asking for an update on the home health for after surgery. I relayed the information that was documented by [facility] yesterday. I also advised Ms. Millhouse that her voicemail was full and Olivia Mackie was unable to leave a message, so she should clear out her voicemail box. She will be getting a call from [facility] before surgery and I don't want her to miss the call and the staff not be able to leave a message.   Ms. Goatley expressed that she is very anxious about everything, as she lives alone and does not have help post operatively. Since Home Health is unable to provide care, she is uncertain who will be able to help her. I expressed, as previously discussed with myself and Dr. Marla Roe, that our main concern is her health and if she does not have post-operative support, we may need to consider postponing. She was tearful and worried about the insurance authorization, and she would like to take the weekend to think about everything. I advised that her authorization is valid through Feb 28, 2021, so postponing would not be an issue for [place]; the main concern is her health and overall well-being. Ms. Jernigan expressed understanding and agreement, and I will check in with her next week.

## 2020-09-07 NOTE — Progress Notes (Signed)
      [facility] Counselor/Therapist Progress Note  [name], MRN: [id number],    Date: 09/07/2020   Time Spent:  68mins  Treatment Type: Individual Therapy  Reported Symptoms:  anxious/ irritated  Mental Status Exam:  Appearance:   Well Groomed     Behavior:  Appropriate and Sharing  Motor:  Normal  Speech/Language:   Clear and Coherent and Pressured  Affect:  Appropriate  Mood:  anxious, irritable, and labile  Thought process:  normal  Thought content:    Rumination  Sensory/Perceptual disturbances:    WNL  Orientation:  x4  Attention:  Good  Concentration:  Good  Memory:  WNL  Fund of knowledge:   Good  Insight:    Good  Judgment:   Fair  Impulse Control:  Good   Risk Assessment: Danger to Self:  No Self-injurious Behavior: No Danger to Others: No Duty to Warn:no Physical Aggression / Violence:No  Access to Firearms a concern: No  Gang Involvement:No   Subjective: Client reported being so irritated and anxious due to having issues with finding a home health nurse to care for her after surgery. Client with loud and pressured speech, hypo-manic-like and "at her wits end".  Client processed about how her her friend is hurting her and its making her so angry. Client trying to talk to her friend about her resentments and therapist used MI, & Roleplaying to help support her in processing her frustration and also help her practice what she wants to say in a way that wont ruin her friendship. Client processed her concerns/feelings about her friends' lack of boundaries with her family, that pushes their time around together. Client processed her motivation for the surgery and what she looks forward to physically. Therapist assessed for stability and client denied SI/HI/AVH.   Interventions: Roleplay, Motivational Interviewing, and RPT    Diagnosis:   ICD-10-CM   1. Adjustment disorder with mixed anxiety and depressed mood  F43.23      Plan of Care:   Client is to return to therapy with therapist every 1-2 weeks as needed to process traumas/ frustrations in a safe space, to be re-evaluated in 3 months.  Client is to practice mindfulness AEB daily meditation and body scans or as needed when flooded by emotion/pain.  Client is to learn/practice DBT wise mind & radical acceptance. Client is to practice self-compassion AEB being gentle with themselves, utilizing self-care techniques daily or as needed when grieving something in the moment.  Client is to process grief/pain of their loss in a somatic body-felt sense way: ie using mindfulness, brainspotting, or trauma release as a method for releasing body pain/tension caused by grief.  Barnie Del, LCSW, LCAS, CCTP, CCS-I, BSP

## 2020-09-12 ENCOUNTER — Telehealth: Payer: Medicare HMO

## 2020-09-12 ENCOUNTER — Telehealth: Payer: Self-pay

## 2020-09-12 ENCOUNTER — Ambulatory Visit (INDEPENDENT_AMBULATORY_CARE_PROVIDER_SITE_OTHER): Payer: Medicare HMO

## 2020-09-12 DIAGNOSIS — E1122 Type 2 diabetes mellitus with diabetic chronic kidney disease: Secondary | ICD-10-CM | POA: Diagnosis not present

## 2020-09-12 DIAGNOSIS — I129 Hypertensive chronic kidney disease with stage 1 through stage 4 chronic kidney disease, or unspecified chronic kidney disease: Secondary | ICD-10-CM | POA: Diagnosis not present

## 2020-09-12 DIAGNOSIS — Z6836 Body mass index (BMI) 36.0-36.9, adult: Secondary | ICD-10-CM

## 2020-09-12 DIAGNOSIS — Z794 Long term (current) use of insulin: Secondary | ICD-10-CM

## 2020-09-12 DIAGNOSIS — F419 Anxiety disorder, unspecified: Secondary | ICD-10-CM

## 2020-09-12 DIAGNOSIS — N183 Chronic kidney disease, stage 3 unspecified: Secondary | ICD-10-CM

## 2020-09-12 DIAGNOSIS — M793 Panniculitis, unspecified: Secondary | ICD-10-CM

## 2020-09-12 NOTE — Chronic Care Management (AMB) (Signed)
Chronic Care Management Pharmacy Assistant   Name: Teresa Golden  MRN: [id number] DOB: 1945-08-10   Reason for Encounter: Disease State/ Hypertension  Recent office visits:  08-17-2020 Daneen Schick (CCM)  Recent consult visits:  08-20-2020 Barnie Del, LCSW ([facility]). Unable to view notes.  09-04-2020 Scheeler, Carola Rhine, PA-C [facility] plastic surgery). No changes  09-07-2020 Barnie Del, LCSW ([facility]). Unable to view notes.  Hospital visits:  None in previous 6 months  Medications: Outpatient Encounter Medications as of 09/12/2020  Medication Sig Note   amLODipine (NORVASC) 5 MG tablet Take 1 tablet by mouth once daily    B Complex-C (B-COMPLEX WITH VITAMIN C) tablet Take 1 tablet by mouth daily.    Biotin 10000 MCG TABS Take 10,000 mcg by mouth daily.    Cholecalciferol 5000 units TABS Take 5,000 Units by mouth daily.    Colchicine 0.6 MG CAPS TAKE 1 CAPSULE DAILY AS    NEEDED (Patient taking differently: Take 0.6 mg by mouth daily as needed (gout).) 06/20/2020: Patient takes as needed   Cranberry-Vitamin C 250-60 MG CAPS Take 1 tablet by mouth daily.    diclofenac Sodium (VOLTAREN) 1 % GEL APPLY 2 GRAMS TO AFFECTED AREA(S) 4 TIMES DAILY AS NEEDED (Patient taking differently: Apply 2 g topically 4 (four) times daily as needed (pain).)    enoxaparin (LOVENOX) 40 MG/0.4ML injection Inject 40 mg into the skin daily. Taking for 7 days after procedure 07/19/2020: After procedure   gabapentin (NEURONTIN) 300 MG capsule Take 600 mg by mouth at bedtime. Pt taking 600 MG at bedtime    glucose blood (ONETOUCH VERIO) test strip Use as instructed to check blood sugars 2 times per day dx: e11.65    insulin degludec (TRESIBA FLEXTOUCH) 100 UNIT/ML SOPN FlexTouch Pen Inject 0.09 mLs (9 Units total) into the skin daily at 10 pm. (Patient taking differently: Inject 12 Units into the skin daily at 10 pm.)    losartan (COZAAR) 100 MG tablet Take 1 tablet (100 mg  total) by mouth daily.    metoprolol succinate (TOPROL-XL) 25 MG 24 hr tablet 1 tablet by mouth daily at bedtime    Multiple Vitamins-Minerals (MULTIVITAMIN WITH MINERALS) tablet Take 1 tablet by mouth daily. Women's 50+    naproxen sodium (ANAPROX) 220 MG tablet Take 440 mg by mouth at bedtime. 2 tablets every night at bedtime    ondansetron (ZOFRAN) 4 MG tablet Take 1 tablet (4 mg total) by mouth every 8 (eight) hours as needed for nausea or vomiting. 07/19/2020: For after procedure    OneTouch Delica Lancets 93Y MISC Use as instructed to check blood sugars 2 times per day dx: e11.65    OZEMPIC, 1 MG/DOSE, 2 MG/1.5ML SOPN INJECT INTO SKIN ONCE A WEEK (Patient taking differently: Inject 1 mg into the skin every Saturday. 1mg )    simvastatin (ZOCOR) 10 MG tablet Take 1 tablet (10 mg total) by mouth daily.    SYNTHROID 25 MCG tablet Take 0.5 tablet (12.5 mcg) by mouth on Mondays through Fridays, then take 1 tablet (25 mcg) by mouth on Saturdays and Sundays. (Patient taking differently: Take 12.5-25 mcg by mouth See admin instructions. Take 0.5 tablet (12.5 mcg) by mouth on Mondays through Fridays, then take 1 tablet (25 mcg) by mouth on Saturdays and Sundays.)    No facility-administered encounter medications on file as of 09/12/2020.   Reviewed chart prior to disease state call. Spoke with patient regarding BP  Recent Office Vitals: BP  Readings from Last 3 Encounters:  09/04/20 (!) 172/75  07/17/20 132/72  07/03/20 (!) 178/83   Pulse Readings from Last 3 Encounters:  09/04/20 (!) 58  07/17/20 82  07/03/20 61    Wt Readings from Last 3 Encounters:  09/04/20 213 lb 6.4 oz (96.8 kg)  07/17/20 215 lb 6.4 oz (97.7 kg)  07/03/20 215 lb (97.5 kg)     Kidney Function Lab Results  Component Value Date/Time   CREATININE 0.95 05/31/2020 10:41 AM   CREATININE 0.96 11/29/2019 02:11 PM   GFRNONAA 58 (L) 11/29/2019 02:11 PM   GFRAA 67 11/29/2019 02:11 PM    BMP Latest Ref Rng & Units 05/31/2020  11/29/2019 08/18/2019  Glucose 65 - 99 mg/dL 120(H) 128(H) 119(H)  BUN 8 - 27 mg/dL 14 18 12   Creatinine 0.57 - 1.00 mg/dL 0.95 0.96 0.83  BUN/Creat Ratio 12 - 28 15 19 14   Sodium 134 - 144 mmol/L 141 145(H) 140  Potassium 3.5 - 5.2 mmol/L 4.1 4.2 4.0  Chloride 96 - 106 mmol/L 105 105 101  CO2 20 - 29 mmol/L 22 23 26   Calcium 8.7 - 10.3 mg/dL 9.3 9.6 9.6    Current antihypertensive regimen:  Metoprolol Succinate 25 mg daily Losartan 100 mg tablet daily  How often are you checking your Blood Pressure? daily  Current home BP readings: 147/72  What recent interventions/DTPs have been made by any provider to improve Blood Pressure control since last CPP Visit: Daily salt intake goal < 2300 mg Counseled to monitor BP at home at least once per day, document, and provide log at future appointments Exercise goal of 150 minutes per week  Any recent hospitalizations or ED visits since last visit with CPP? No  What diet changes have been made to improve Blood Pressure Control?  Patient stated for Breakfast: toast with a tbsp of peanut butter, croissant lunch: salad, ravioli and soup - small portions dinner: salad, ravioli, and soup - small portions snacks: Pop-Secret 100 calories per serving drinks: green [place] diet tea, ocean spray zero calories, propel - black cherry, diet ginger ale.  What exercise is being done to improve your Blood Pressure Control?  Patient states she goes to the [facility] twice weekly doing water aerobics.  Adherence Review: Is the patient currently on ACE/ARB medication? Yes Does the patient have >5 day gap between last estimated fill dates? No  NOTES: Patient stated she has been drinking plenty of water and her kidney function is 50 which is at stage 2 now. Made patient telephone appointment with [name] CPP in late August.  Care Gaps: Dose 2 of PCV13 overdue Medicare wellness overdue  Star Rating Drugs: Losartan 100 mg- Last filled 08-31-2020 90 DS  [facility] Simvastatin 10 mg- Last filled 08-31-2020 90 DS [facility] Ozempic 1 mg- Patient assistance  [facility] Pharmacist Assistant [phone]

## 2020-09-13 ENCOUNTER — Other Ambulatory Visit: Payer: Self-pay

## 2020-09-13 ENCOUNTER — Ambulatory Visit (INDEPENDENT_AMBULATORY_CARE_PROVIDER_SITE_OTHER): Payer: Medicare HMO | Admitting: Addiction (Substance Use Disorder)

## 2020-09-13 DIAGNOSIS — F4323 Adjustment disorder with mixed anxiety and depressed mood: Secondary | ICD-10-CM | POA: Diagnosis not present

## 2020-09-13 NOTE — Progress Notes (Signed)
      [facility] Counselor/Therapist Progress Note  [name], MRN: [id number],    Date: 09/13/2020   Time Spent:  41mins  Treatment Type: Individual Therapy  Reported Symptoms: "pre-op jitters"  Mental Status Exam:  Appearance:   Well Groomed     Behavior:  Appropriate and Sharing  Motor:  Normal  Speech/Language:   Clear and Coherent and Pressured  Affect:  Appropriate  Mood:  anxious and labile  Thought process:  normal  Thought content:    Rumination  Sensory/Perceptual disturbances:    WNL  Orientation:  x4  Attention:  Good  Concentration:  Good  Memory:  WNL  Fund of knowledge:   Good  Insight:    Good  Judgment:   Fair  Impulse Control:  Good   Risk Assessment: Danger to Self:  No Self-injurious Behavior: No Danger to Others: No Duty to Warn:no Physical Aggression / Violence:No  Access to Firearms a concern: No  Gang Involvement:No   Subjective: Client reported having the pre-op jitters due to the severity of the surgery and esp not feeling that she can trust that her rebhab people will be able to help her recover. Client reported having a talk with her friend Mable who was stressing her out and making her feel irritable. Client feeling more stable emotionally and has decided to cancel her medication appt because shes feeling more at ease after talking to her pre-op nurse about her concerns. The nurse reproted her kidney levels & A1C are better, making them feel more at [facility] about her ability to recovery form the surgery. Client reported changes in her diet she has made that make her feel better. Therapist used MI to help support client and CBT to help her process her thoughts/feelings. Therapist assessed for stability and client denied SI/HI/AVH.   Interventions: Cognitive Behavioral Therapy, Motivational Interviewing, and RPT    Diagnosis:   ICD-10-CM   1. Adjustment disorder with mixed anxiety and depressed mood  F43.23       Plan of Care:   Client is to return to therapy with therapist every 1-2 weeks as needed to process traumas/ frustrations in a safe space, to be re-evaluated in 3 months.  Client is to practice mindfulness AEB daily meditation and body scans or as needed when flooded by emotion/pain.  Client is to learn/practice DBT wise mind & radical acceptance. Client is to practice self-compassion AEB being gentle with themselves, utilizing self-care techniques daily or as needed when grieving something in the moment.  Client is to process grief/pain of their loss in a somatic body-felt sense way: ie using mindfulness, brainspotting, or trauma release as a method for releasing body pain/tension caused by grief.  Barnie Del, LCSW, LCAS, CCTP, CCS-I, BSP

## 2020-09-17 NOTE — Progress Notes (Signed)
Surgical Instructions   Your procedure is scheduled on Wednesday September 26, 2020.  Report to [facility] Main Entrance "A" at 06:30 A.M., then check in with the Admitting office.  Call [phone] if you have problems or questions between now and the morning of surgery:   Remember: Do not eat after midnight the night before your surgery  You may drink clear liquids until 05:30am the morning of your surgery.   Clear liquids allowed are: Water, Non-Citrus Juices (without pulp), Carbonated Beverages, Clear Tea, Black Coffee Only, and Gatorade   Take these medicines the morning of surgery with A SIP OF WATER  Amlodipine (Norvasc) Simvastatin (Zocor) Synthroid  If needed you may take these medications the morning of surgery: Colchicine  As of today, STOP taking any Diclofenac sodium (Voltaren) gel, Aspirin (unless otherwise instructed by your surgeon) or Aspirin-containing products; NSAIDS - Aleve, Naproxen, Ibuprofen, Motrin, Advil, Goody's, BC's, all herbal medications, fish oil, and all vitamins.  WHAT DO I DO ABOUT MY DIABETES MEDICATION?  Do not take oral diabetes medicines (pills) the morning of surgery.  THE NIGHT BEFORE SURGERY, take 6 units of Insulin Degludec [name] Flextouch).     The day of surgery, do not take other diabetes injectables, including Byetta (exenatide), Bydureon (exenatide ER), Victoza (liraglutide), or Trulicity (dulaglutide).   HOW TO MANAGE YOUR DIABETES BEFORE AND AFTER SURGERY  Why is it important to control my blood sugar before and after surgery? Improving blood sugar levels before and after surgery helps healing and can limit problems. A way of improving blood sugar control is eating a healthy diet by:  Eating less sugar and carbohydrates  Increasing activity/exercise  Talking with your doctor about reaching your blood sugar goals High blood sugars (greater than 180 mg/dL) can raise your risk of infections and slow your recovery, so you will  need to focus on controlling your diabetes during the weeks before surgery. Make sure that the doctor who takes care of your diabetes knows about your planned surgery including the date and location.  How do I manage my blood sugar before surgery? Check your blood sugar at least 4 times a day, starting 2 days before surgery, to make sure that the level is not too high or low.  Check your blood sugar the morning of your surgery when you wake up and every 2 hours until you get to the Short Stay unit.  If your blood sugar is less than 70 mg/dL, you will need to treat for low blood sugar: Do not take insulin. Treat a low blood sugar (less than 70 mg/dL) with  cup of clear juice (cranberry or apple), 4 glucose tablets, OR glucose gel. Recheck blood sugar in 15 minutes after treatment (to make sure it is greater than 70 mg/dL). If your blood sugar is not greater than 70 mg/dL on recheck, call [phone] for further instructions. Report your blood sugar to the short stay nurse when you get to Short Stay.  If you are admitted to the hospital after surgery: Your blood sugar will be checked by the staff and you will probably be given insulin after surgery (instead of oral diabetes medicines) to make sure you have good blood sugar levels. The goal for blood sugar control after surgery is 80-180 mg/dL.           Do not wear jewelry or makeup Do not wear lotions, powders, perfumes/colognes, or deodorant. Do not shave 48 hours prior to surgery.  Men may shave face and neck.  Do not wear nail polish, gel polish, artificial nails, or any other type of covering on natural nails including fingernails and toenails. If patients have artificial nails, gel coating, etc. that need to be removed by a nail salon please have this removed prior to surgery or surgery may need to be canceled/delayed if the surgeon/ anesthesia feels like the patient is unable to be adequately monitored. Do not bring valuables to the  hospital - [facility] is not responsible for any belongings or valuables.              Do NOT Smoke (Tobacco/Vaping) or drink Alcohol 24 hours prior to your procedure  If you use a CPAP at night, you may bring all equipment for your overnight stay.   Contacts, glasses, hearing aids, dentures or partials may not be worn into surgery, please bring cases for these belongings   For patients admitted to the hospital, discharge time will be determined by your treatment team.   Patients discharged the day of surgery will not be allowed to drive home, and someone needs to stay with them for 24 hours.  ONLY ONE (1) SUPPORT PERSON MAY WAIT IN THE WAITING AREA WHILE YOU ARE IN SURGERY. NO VISITORS WILL BE ALLOWED IN PRE-OP WHERE PATIENTS GET READY FOR SURGERY.  TWO (2) VISITORS WILL BE ALLOWED IN YOUR ROOM IF YOU ARE ADMITTED AFTER SURGERY.  Minor children may have two parents present. Special consideration for safety and communication needs will be reviewed on a case by case basis.  Special instructions:    Oral Hygiene is also important to reduce your risk of infection.  Remember - BRUSH YOUR TEETH THE MORNING OF SURGERY WITH YOUR REGULAR TOOTHPASTE   [place]- Preparing For Surgery  Before surgery, you can play an important role. Because skin is not sterile, your skin needs to be as free of germs as possible. You can reduce the number of germs on your skin by washing with CHG (chlorahexidine gluconate) Soap before surgery.  CHG is an antiseptic cleaner which kills germs and bonds with the skin to continue killing germs even after washing.     Please do not use if you have an allergy to CHG or antibacterial soaps. If your skin becomes reddened/irritated stop using the CHG.  Do not shave (including legs and underarms) for at least 48 hours prior to first CHG shower. It is OK to shave your face.  Please follow these instructions carefully.     Shower the NIGHT BEFORE SURGERY and the MORNING  OF SURGERY with CHG Soap.   If you chose to wash your hair, wash your hair first as usual with your normal shampoo. After you shampoo, rinse your hair and body thoroughly to remove the shampoo.    Then [employer] and genitals (private parts) with your normal soap and rinse thoroughly to remove soap.  Next use the CHG Soap as you would any other liquid soap. You can apply CHG directly to the skin and wash gently with a clean washcloth.   Apply the CHG Soap to your body ONLY FROM THE NECK DOWN.  Do not use on open wounds or open sores. Avoid contact with your eyes, ears, mouth and genitals (private parts). Wash Face and genitals (private parts)  with your normal soap.   Wash thoroughly, paying special attention to the area where your surgery will be performed.  Thoroughly rinse your body with warm water from the neck down.  DO NOT shower/wash with  your normal soap after using and rinsing off the CHG Soap.  Pat yourself dry with a CLEAN TOWEL.  Wear CLEAN PAJAMAS to bed the night before surgery  Place CLEAN SHEETS on your bed the night before your surgery  DO NOT SLEEP WITH PETS.   Day of Surgery:  Take a shower with CHG soap. Wear Clean/Comfortable clothing the morning of surgery Do not apply any deodorants/lotions.   Remember to brush your teeth WITH YOUR REGULAR TOOTHPASTE.   Please read over the following fact sheets that you were given.

## 2020-09-18 ENCOUNTER — Encounter (HOSPITAL_COMMUNITY)
Admission: RE | Admit: 2020-09-18 | Discharge: 2020-09-18 | Disposition: A | Discharge status: Home / Self Care | Payer: Medicare HMO | Source: Ambulatory Visit | Attending: Plastic Surgery | Admitting: Plastic Surgery

## 2020-09-18 ENCOUNTER — Other Ambulatory Visit: Payer: Self-pay

## 2020-09-18 ENCOUNTER — Encounter (HOSPITAL_COMMUNITY): Payer: Self-pay

## 2020-09-18 ENCOUNTER — Ambulatory Visit: Payer: Medicare HMO

## 2020-09-18 DIAGNOSIS — I129 Hypertensive chronic kidney disease with stage 1 through stage 4 chronic kidney disease, or unspecified chronic kidney disease: Secondary | ICD-10-CM

## 2020-09-18 DIAGNOSIS — Z01812 Encounter for preprocedural laboratory examination: Secondary | ICD-10-CM | POA: Insufficient documentation

## 2020-09-18 DIAGNOSIS — Z794 Long term (current) use of insulin: Secondary | ICD-10-CM

## 2020-09-18 DIAGNOSIS — N183 Chronic kidney disease, stage 3 unspecified: Secondary | ICD-10-CM

## 2020-09-18 DIAGNOSIS — E1122 Type 2 diabetes mellitus with diabetic chronic kidney disease: Secondary | ICD-10-CM

## 2020-09-18 HISTORY — DX: Depression, unspecified: F32.A

## 2020-09-18 LAB — COMPREHENSIVE METABOLIC PANEL
ALT: 19 U/L (ref 0–44)
AST: 23 U/L (ref 15–41)
Albumin: 3.4 g/dL — ABNORMAL LOW (ref 3.5–5.0)
Alkaline Phosphatase: 68 U/L (ref 38–126)
Anion gap: 7 (ref 5–15)
BUN: 17 mg/dL (ref 8–23)
CO2: 27 mmol/L (ref 22–32)
Calcium: 9.2 mg/dL (ref 8.9–10.3)
Chloride: 106 mmol/L (ref 98–111)
Creatinine, Ser: 0.9 mg/dL (ref 0.44–1.00)
GFR, Estimated: >60 mL/min (ref >60)
Glucose, Bld: 134 mg/dL — ABNORMAL HIGH (ref 70–99)
Potassium: 3.9 mmol/L (ref 3.5–5.1)
Sodium: 140 mmol/L (ref 135–145)
Total Bilirubin: 0.9 mg/dL (ref 0.3–1.2)
Total Protein: 6.9 g/dL (ref 6.5–8.1)

## 2020-09-18 LAB — GLUCOSE, CAPILLARY: Glucose-Capillary: 122 mg/dL — ABNORMAL HIGH (ref 70–99)

## 2020-09-18 LAB — CBC
HCT: 34 % — ABNORMAL LOW (ref 36.0–46.0)
Hemoglobin: 11.1 g/dL — ABNORMAL LOW (ref 12.0–15.0)
MCH: 27.8 pg (ref 26.0–34.0)
MCHC: 32.6 g/dL (ref 30.0–36.0)
MCV: 85.2 fL (ref 80.0–100.0)
Platelets: 184 10*3/uL (ref 150–400)
RBC: 3.99 MIL/uL (ref 3.87–5.11)
RDW: 14.3 % (ref 11.5–15.5)
WBC: 9 10*3/uL (ref 4.0–10.5)
nRBC: 0 % (ref 0.0–0.2)

## 2020-09-18 NOTE — Progress Notes (Addendum)
PCP - Dr. Bailey Mech sanders Cardiologist - Dr. Cherlynn Kaiser  Chest x-ray - Not indicated EKG - 05/07/20 Stress Test - 08/06/16 ECHO - 06/05/20 Cardiac Cath - Denies  Sleep Study - Yes no OSA  DM - Type II Fasting Blood Sugar - 110-126 Checks Blood Sugar __2___ times a day A1C last 6.1 CBG at PAT 122 - only coffee  COVID TEST- 09/24/20  Anesthesia review: No   Patient denies shortness of breath, fever, cough and chest pain at PAT appointment   All instructions explained to the patient, with a verbal understanding of the material. Patient agrees to go over the instructions while at home for a better understanding.  The opportunity to ask questions was provided.

## 2020-09-18 NOTE — Patient Instructions (Signed)
Social Worker Visit Information  Goals we discussed today:   Goals Addressed             This Visit's Progress    COMPLETED: Housing Opportunities Identified       Timeframe:  Long-Range Goal Priority:  High Start Date:   6.24.22                          Expected End Date: 10.22.22                       Goal Met  Patient Goals/Self-Care Activities patient will:   Follow up with [place] and [place] regarding housing once there is a Industrial/product designer     Work with SW to manage care coordination needs       Timeframe:  Long-Range Goal Priority:  [employer] Start Date:   12.16.21                                             Next date of contact: 8.8.22  Patient Goals/Self-Care Activities patient will:   - Complete COVID testing August 1  -Contact SW as needed prior to next scheduled appointment         Follow Up Plan: SW will follow up with the patient on August 8  Daneen Schick, [place], CDP Social Worker, Certified Dementia Practitioner [place] [facility] [place] [facility] [phone]

## 2020-09-18 NOTE — Chronic Care Management (AMB) (Signed)
Chronic Care Management    Social Work Note  09/18/2020 Name: Teresa Golden MRN: [id number] DOB: 1945-12-26  Teresa Golden is a 75 y.o. year old female who is a primary care patient of Glendale Chard, MD. The CCM team was consulted to assist the patient with chronic disease management and/or care coordination needs related to: [employer] .   Engaged with patient by telephone for follow up visit in response to provider referral for social work chronic care management and care coordination services.   Consent to Services:  The patient was given information about Chronic Care Management services, agreed to services, and gave verbal consent prior to initiation of services.  Please see initial visit note for detailed documentation.   Patient agreed to services and consent obtained.   Assessment: Review of patient past medical history, allergies, medications, and health status, including review of relevant consultants reports was performed today as part of a comprehensive evaluation and provision of chronic care management and care coordination services.     SDOH (Social Determinants of Health) assessments and interventions performed:    Advanced Directives Status: Not addressed in this encounter.  CCM Care Plan  Allergies  Allergen Reactions   Meloxicam Other (See Comments)    Fever; muscle aches; "flu-like" symptoms   Other     Rose fever and hay fever     Outpatient Encounter Medications as of 09/18/2020  Medication Sig Note   amLODipine (NORVASC) 5 MG tablet Take 1 tablet by mouth once daily (Patient taking differently: Take 5 mg by mouth at bedtime.)    B Complex-C (B-COMPLEX WITH VITAMIN C) tablet Take 1 tablet by mouth daily.    Biotin 10000 MCG TABS Take 10,000 mcg by mouth daily.    Cholecalciferol 5000 units TABS Take 5,000 Units by mouth daily.    Colchicine 0.6 MG CAPS TAKE 1 CAPSULE DAILY AS    NEEDED (Patient taking differently: Take 0.6 mg by mouth daily as  needed (gout).) 06/20/2020: Patient takes as needed   Cranberry-Vitamin C 250-60 MG CAPS Take 1 tablet by mouth daily. 09/17/2020: AZO   diclofenac Sodium (VOLTAREN) 1 % GEL APPLY 2 GRAMS TO AFFECTED AREA(S) 4 TIMES DAILY AS NEEDED (Patient taking differently: Apply 2 g topically 4 (four) times daily as needed (pain).)    enoxaparin (LOVENOX) 40 MG/0.4ML injection Inject 40 mg into the skin daily. For 7 days after procedure 09/17/2020: FOR AFTER PROCEDURE    gabapentin (NEURONTIN) 300 MG capsule Take 300-600 mg by mouth at bedtime.    glucose blood (ONETOUCH VERIO) test strip Use as instructed to check blood sugars 2 times per day dx: e11.65    insulin degludec (TRESIBA FLEXTOUCH) 100 UNIT/ML SOPN FlexTouch Pen Inject 0.09 mLs (9 Units total) into the skin daily at 10 pm. (Patient taking differently: Inject 12 Units into the skin daily at 10 pm.)    losartan (COZAAR) 100 MG tablet Take 100 mg by mouth daily.    metoprolol succinate (TOPROL-XL) 25 MG 24 hr tablet 1 tablet by mouth daily at bedtime (Patient taking differently: Take 25 mg by mouth daily. 1 tablet by mouth daily at bedtime)    Multiple Vitamins-Minerals (MULTIVITAMIN WITH MINERALS) tablet Take 1 tablet by mouth daily. Women's 50+    naproxen sodium (ANAPROX) 220 MG tablet Take 440 mg by mouth at bedtime. 2 tablets every night at bedtime 09/17/2020: ALEVE    ondansetron (ZOFRAN) 4 MG tablet Take 1 tablet (4 mg total) by mouth every 8 (  eight) hours as needed for nausea or vomiting. 09/17/2020: For after procedure    OneTouch Delica Lancets 17P MISC Use as instructed to check blood sugars 2 times per day dx: e11.65    OZEMPIC, 1 MG/DOSE, 2 MG/1.5ML SOPN INJECT INTO SKIN ONCE A WEEK (Patient taking differently: Inject 1 mg into the skin every Saturday. 68m)    simvastatin (ZOCOR) 10 MG tablet Take 1 tablet (10 mg total) by mouth daily.    SYNTHROID 25 MCG tablet Take 0.5 tablet (12.5 mcg) by mouth on Mondays through Fridays, then take 1 tablet (25  mcg) by mouth on Saturdays and Sundays. (Patient taking differently: Take 12.5-25 mcg by mouth See admin instructions. Take 0.5 tablet (12.5 mcg) by mouth on Mondays through Fridays, then take 1 tablet (25 mcg) by mouth on Saturdays and Sundays.)    No facility-administered encounter medications on file as of 09/18/2020.    Patient Active Problem List   Diagnosis Date Noted   Type 2 diabetes mellitus with stage 3 chronic kidney disease, with long-term current use of insulin ([place] 11/29/2019   Panniculitis 09/30/2019   Back pain 09/30/2019   Hepatoma ([place] 01/21/2019   Other cirrhosis of liver ([place] 07/05/2018   Hypertensive nephropathy 07/05/2018   Chronic renal disease, stage III ([place] 07/05/2018   Chronic left shoulder pain 07/05/2018   Hypothyroidism 02/06/2018   Hepatitis C virus infection cured after antiviral drug therapy 02/04/2018   Osteoarthritis of right knee 07/30/2017   S/P laparoscopic sleeve gastrectomy July 2018 08/26/2016   Preop cardiovascular exam 07/30/2016   Dyspnea on exertion 07/30/2016   Class 2 severe obesity due to excess calories with serious comorbidity and body mass index (BMI) of 39.0 to 39.9 in adult ([place] 07/30/2016   Bilateral lower extremity edema 07/30/2016    Conditions to be addressed/monitored: HTN, DMII, and CKD Stage III ; Transportation  Care Plan : Social Work Care Plan  Updates made by HDaneen Schicksince 09/18/2020 12:00 AM     Problem: Care Coordination      Long-Range Goal: Collaborate with RN Care Manager to perform appropriate assessments to assist with care coordination needs   Start Date: 03/02/2020  Recent Progress: On track  Priority: Low  Note:   Current Barriers:  Chronic conditions including DM II, CKD III, and HTN which put patient at increased risk of hospitalization Upcomming Panniculectomy without home health in place  Social Work Clinical Goal(s):  patient will work with SW to address concerns related to DM II, CKD  III, and HTN and manage care coordination needs  Interventions: 1:1 collaboration with SGlendale Chard MD regarding development and update of comprehensive plan of care as evidenced by provider attestation and co-signature Inter-disciplinary care team collaboration (see longitudinal plan of care) Successful outbound call placed to the patient to assess for care coordination needs Determined the patients surgeon has been unable to locate a home health agency who has staff to accept referral for post-operative care Patient reports she [place] and feels she will be able to provide needed care on her own Discussed the patients friend who will be staying in her home for a minimum of 5 days to assist with care needs has cared for wounds with drains in the past and feels equipped to assist the patient Patient attended her pre-op appointment today and surgery is scheduled for August 3rd Patient is scheduled for pre-op COVD testing on Monday August 1 - no transportation barriers  Discussed patient may need transportation assistance to post op follow up appointment scheduled for August 10 depending on functional ability Scheduled follow up call for August 8th to determine if patient feels she will need transportation assistance  Patient Goals/Self-Care Activities patient will:   - Complete COVID testing August 1  -Contact SW as needed prior to next scheduled appointment  Follow up Plan: SW will follow up with patient by phone over the next 14 days     Problem: Housing Resolved 09/18/2020     Long-Range Goal: Housing Opportunities Identified Completed 09/18/2020  Start Date: 08/17/2020  Expected End Date: 12/15/2020  Priority: High  Note:   Current Barriers:  Chronic disease management support and education needs related to HTN, DM, and CKD Stage III   Recent increase in rent causing anxiety related to future increases Limited housing  options in the area within budget  Social Worker Clinical Goal(s):  patient will work with SW to identify and address any acute and/or chronic care coordination needs related to the self health management of HTN, DM, and CKD Stage III   Patient will work with SW to identify future opportunities for housing  SW Interventions:  Inter-disciplinary care team collaboration (see longitudinal plan of care) Collaboration with Glendale Chard, MD regarding development and update of comprehensive plan of care as evidenced by provider attestation and co-signature Successful outbound call placed to the patient to assess goal progression Determined the patient has completed and submitted her application to be placed on the wait list for [facility] Discussed the patient has also applied for housing through [employer] for a new senior apartment complex called [place] Goal Met  Patient Goals/Self-Care Activities patient will:   -  Follow up with [employer] and [place] regarding housing once there is a Actuary Up Plan: SW will follow up with patient by phone over the next 14 days      Daneen Schick, BSW, CDP Social Worker, Certified Dementia Practitioner [place] [facility] [place] [facility] [phone]

## 2020-09-18 NOTE — Progress Notes (Signed)
Surgical Instructions     Your procedure is scheduled on Wednesday September 26, 2020.   Report to [facility] Main Entrance "A" at 06:30 A.M., then check in with the Admitting office.   Call [phone] if you have problems or questions between now and the morning of surgery:              Remember: Do not eat or drink anything after midnight the night before your surgery               Take these medicines the morning of surgery with A SIP OF WATER  Amlodipine (Norvasc) Simvastatin (Zocor) Synthroid   If needed you may take these medications the morning of surgery: Colchicine   As of today, STOP taking any Diclofenac sodium (Voltaren) gel, Aspirin (unless otherwise instructed by your surgeon) or Aspirin-containing products; NSAIDS - Aleve, Naproxen, Ibuprofen, Motrin, Advil, Goody's, BC's, all herbal medications, fish oil, and all vitamins.   WHAT DO I DO ABOUT MY DIABETES MEDICATION?   Do not take oral diabetes medicines (pills) the morning of surgery.   THE NIGHT BEFORE SURGERY, take 6 units of Insulin Degludec [name] Flextouch).                                               The day of surgery, do not take other diabetes injectables, including Byetta (exenatide), Bydureon (exenatide ER), Victoza (liraglutide), or Trulicity (dulaglutide).     HOW TO MANAGE YOUR DIABETES BEFORE AND AFTER SURGERY   Why is it important to control my blood sugar before and after surgery? Improving blood sugar levels before and after surgery helps healing and can limit problems. A way of improving blood sugar control is eating a healthy diet by:  Eating less sugar and carbohydrates  Increasing activity/exercise  Talking with your doctor about reaching your blood sugar goals High blood sugars (greater than 180 mg/dL) can raise your risk of infections and slow your recovery, so you will need to focus on controlling your diabetes during the weeks before surgery. Make sure that the doctor who takes care  of your diabetes knows about your planned surgery including the date and location.   How do I manage my blood sugar before surgery? Check your blood sugar at least 4 times a day, starting 2 days before surgery, to make sure that the level is not too high or low.   Check your blood sugar the morning of your surgery when you wake up and every 2 hours until you get to the Short Stay unit.   If your blood sugar is less than 70 mg/dL, you will need to treat for low blood sugar: Do not take insulin. Treat a low blood sugar (less than 70 mg/dL) with  cup of clear juice (cranberry or apple), 4 glucose tablets, OR glucose gel. Recheck blood sugar in 15 minutes after treatment (to make sure it is greater than 70 mg/dL). If your blood sugar is not greater than 70 mg/dL on recheck, call [phone] for further instructions. Report your blood sugar to the short stay nurse when you get to Short Stay.   If you are admitted to the hospital after surgery: Your blood sugar will be checked by the staff and you will probably be given insulin after surgery (instead of oral diabetes medicines) to make sure you have good blood sugar  levels. The goal for blood sugar control after surgery is 80-180 mg/dL.            Do not wear jewelry or makeup Do not wear lotions, powders, perfumes/colognes, or deodorant. Do not shave 48 hours prior to surgery.  Men may shave face and neck. Do not wear nail polish, gel polish, artificial nails, or any other type of covering on natural nails including fingernails and toenails. If patients have artificial nails, gel coating, etc. that need to be removed by a nail salon please have this removed prior to surgery or surgery may need to be canceled/delayed if the surgeon/ anesthesia feels like the patient is unable to be adequately monitored. Do not bring valuables to the hospital - [facility] is not responsible for any belongings or valuables.               Do NOT Smoke  (Tobacco/Vaping) or drink Alcohol 24 hours prior to your procedure   If you use a CPAP at night, you may bring all equipment for your overnight stay.   Contacts, glasses, hearing aids, dentures or partials may not be worn into surgery, please bring cases for these belongings   For patients admitted to the hospital, discharge time will be determined by your treatment team.   Patients discharged the day of surgery will not be allowed to drive home, and someone needs to stay with them for 24 hours.   ONLY ONE (1) SUPPORT PERSON MAY WAIT IN THE WAITING AREA WHILE YOU ARE IN SURGERY. NO VISITORS WILL BE ALLOWED IN PRE-OP WHERE PATIENTS GET READY FOR SURGERY.  TWO (2) VISITORS WILL BE ALLOWED IN YOUR ROOM IF YOU ARE ADMITTED AFTER SURGERY.  Minor children may have two parents present. Special consideration for safety and communication needs will be reviewed on a case by case basis.   Special instructions:     Oral Hygiene is also important to reduce your risk of infection.  Remember - BRUSH YOUR TEETH THE MORNING OF SURGERY WITH YOUR REGULAR TOOTHPASTE     [place]- Preparing For Surgery   Before surgery, you can play an important role. Because skin is not sterile, your skin needs to be as free of germs as possible. You can reduce the number of germs on your skin by washing with CHG (chlorahexidine gluconate) Soap before surgery.  CHG is an antiseptic cleaner which kills germs and bonds with the skin to continue killing germs even after washing.       Please do not use if you have an allergy to CHG or antibacterial soaps. If your skin becomes reddened/irritated stop using the CHG. Do not shave (including legs and underarms) for at least 48 hours prior to first CHG shower. It is OK to shave your face.   Please follow these instructions carefully.  Shower the NIGHT  BEFORE SURGERY and the MORNING OF SURGERY with CHG Soap.   If you chose to wash your hair, wash your hair first as usual with your normal shampoo. After you shampoo, rinse your hair and body thoroughly to remove the shampoo.     Then [employer] and genitals (private parts) with your normal soap and rinse thoroughly to remove soap.   Next use the CHG Soap as you would any other liquid soap. You can apply CHG directly to the skin and wash gently with a clean washcloth.    Apply the CHG Soap to your body ONLY FROM THE NECK DOWN.  Do not use on open wounds or open sores. Avoid contact with your eyes, ears, mouth and genitals (private parts). Wash Face and genitals (private parts)  with your normal soap.   Wash thoroughly, paying special attention to the area where your surgery will be performed.   Thoroughly rinse your body with warm water from the neck down.   DO NOT shower/wash with your normal soap after using and rinsing off the CHG Soap.   Pat yourself dry with a CLEAN TOWEL.   Wear CLEAN PAJAMAS to bed the night before surgery   Place CLEAN SHEETS on your bed the night before your surgery   DO NOT SLEEP WITH PETS.     Day of Surgery:   Take a shower with CHG soap. Wear Clean/Comfortable clothing the morning of surgery Do not apply any deodorants/lotions.   Remember to brush your teeth WITH YOUR REGULAR TOOTHPASTE.   Please read over the following fact sheets that you were given.

## 2020-09-19 LAB — HEMOGLOBIN A1C
Hgb A1c MFr Bld: 6.4 % — ABNORMAL HIGH (ref 4.8–5.6)
Mean Plasma Glucose: 137 mg/dL

## 2020-09-19 NOTE — Chronic Care Management (AMB) (Signed)
Chronic Care Management   CCM RN Visit Note  09/12/2020 Name: Teresa Golden MRN: [id number] DOB: 08/19/1945  Subjective: Teresa Golden is a 75 y.o. year old female who is a primary care patient of Glendale Chard, MD. The care management team was consulted for assistance with disease management and care coordination needs.    Engaged with patient by telephone for follow up visit in response to provider referral for case management and/or care coordination services.   Consent to Services:  The patient was given information about Chronic Care Management services, agreed to services, and gave verbal consent prior to initiation of services.  Please see initial visit note for detailed documentation.   Patient agreed to services and verbal consent obtained.   Assessment: Review of patient past medical history, allergies, medications, health status, including review of consultants reports, laboratory and other test data, was performed as part of comprehensive evaluation and provision of chronic care management services.   SDOH (Social Determinants of Health) assessments and interventions performed: Yes, no acute challenges    CCM Care Plan  Allergies  Allergen Reactions   Meloxicam Other (See Comments)    Fever; muscle aches; "flu-like" symptoms   Other     Rose fever and hay fever     Outpatient Encounter Medications as of 09/12/2020  Medication Sig Note   amLODipine (NORVASC) 5 MG tablet Take 1 tablet by mouth once daily (Patient taking differently: Take 5 mg by mouth at bedtime.)    B Complex-C (B-COMPLEX WITH VITAMIN C) tablet Take 1 tablet by mouth daily.    Biotin 10000 MCG TABS Take 10,000 mcg by mouth daily.    Cholecalciferol 5000 units TABS Take 5,000 Units by mouth daily.    Colchicine 0.6 MG CAPS TAKE 1 CAPSULE DAILY AS    NEEDED (Patient taking differently: Take 0.6 mg by mouth daily as needed (gout).) 06/20/2020: Patient takes as needed   Cranberry-Vitamin C 250-60 MG CAPS  Take 1 tablet by mouth daily. 09/17/2020: AZO   diclofenac Sodium (VOLTAREN) 1 % GEL APPLY 2 GRAMS TO AFFECTED AREA(S) 4 TIMES DAILY AS NEEDED (Patient taking differently: Apply 2 g topically 4 (four) times daily as needed (pain).)    enoxaparin (LOVENOX) 40 MG/0.4ML injection Inject 40 mg into the skin daily. For 7 days after procedure 09/17/2020: FOR AFTER PROCEDURE    gabapentin (NEURONTIN) 300 MG capsule Take 300-600 mg by mouth at bedtime.    glucose blood (ONETOUCH VERIO) test strip Use as instructed to check blood sugars 2 times per day dx: e11.65    insulin degludec (TRESIBA FLEXTOUCH) 100 UNIT/ML SOPN FlexTouch Pen Inject 0.09 mLs (9 Units total) into the skin daily at 10 pm. (Patient taking differently: Inject 12 Units into the skin daily at 10 pm.)    losartan (COZAAR) 100 MG tablet Take 100 mg by mouth daily.    metoprolol succinate (TOPROL-XL) 25 MG 24 hr tablet 1 tablet by mouth daily at bedtime (Patient taking differently: Take 25 mg by mouth daily. 1 tablet by mouth daily at bedtime)    Multiple Vitamins-Minerals (MULTIVITAMIN WITH MINERALS) tablet Take 1 tablet by mouth daily. Women's 50+    naproxen sodium (ANAPROX) 220 MG tablet Take 440 mg by mouth at bedtime. 2 tablets every night at bedtime 09/17/2020: ALEVE    ondansetron (ZOFRAN) 4 MG tablet Take 1 tablet (4 mg total) by mouth every 8 (eight) hours as needed for nausea or vomiting. 09/17/2020: For after procedure    OneTouch  Delica Lancets 12I MISC Use as instructed to check blood sugars 2 times per day dx: e11.65    OZEMPIC, 1 MG/DOSE, 2 MG/1.5ML SOPN INJECT INTO SKIN ONCE A WEEK (Patient taking differently: Inject 1 mg into the skin every Saturday. 59m)    simvastatin (ZOCOR) 10 MG tablet Take 1 tablet (10 mg total) by mouth daily.    SYNTHROID 25 MCG tablet Take 0.5 tablet (12.5 mcg) by mouth on Mondays through Fridays, then take 1 tablet (25 mcg) by mouth on Saturdays and Sundays. (Patient taking differently: Take 12.5-25 mcg by  mouth See admin instructions. Take 0.5 tablet (12.5 mcg) by mouth on Mondays through Fridays, then take 1 tablet (25 mcg) by mouth on Saturdays and Sundays.)    [DISCONTINUED] enoxaparin (LOVENOX) 40 MG/0.4ML injection Inject 40 mg into the skin daily. Taking for 7 days after procedure    [DISCONTINUED] losartan (COZAAR) 100 MG tablet Take 1 tablet (100 mg total) by mouth daily.    No facility-administered encounter medications on file as of 09/12/2020.    Patient Active Problem List   Diagnosis Date Noted   Type 2 diabetes mellitus with stage 3 chronic kidney disease, with long-term current use of insulin ([facility] 11/29/2019   Panniculitis 09/30/2019   Back pain 09/30/2019   Hepatoma ([place] 01/21/2019   Other cirrhosis of liver ([facility] 07/05/2018   Hypertensive nephropathy 07/05/2018   Chronic renal disease, stage III ([facility] 07/05/2018   Chronic left shoulder pain 07/05/2018   Hypothyroidism 02/06/2018   Hepatitis C virus infection cured after antiviral drug therapy 02/04/2018   Osteoarthritis of right knee 07/30/2017   S/P laparoscopic sleeve gastrectomy July 2018 08/26/2016   Preop cardiovascular exam 07/30/2016   Dyspnea on exertion 07/30/2016   Class 2 severe obesity due to excess calories with serious comorbidity and body mass index (BMI) of 39.0 to 39.9 in adult ([place] 07/30/2016   Bilateral lower extremity edema 07/30/2016    Conditions to be addressed/monitored: DM, CKD III, Hypertensive Nephropathy, Class 2 severe obesity, Anxiety, Panniculitis   Care Plan : Diabetes Type 2 (Adult)  Updates made by LLynne Logan RN since 09/12/2020 12:00 AM     Problem: Glycemic Management (Diabetes, Type 2)   Priority: High     Long-Range Goal: Glycemic Management Optimized   Start Date: 02/01/2020  Expected End Date: 01/31/2021  Recent Progress: On track  Priority: High  Note:   Objective:  Lab Results  Component Value Date   HGBA1C 6.4 (H) 09/18/2020   Lab Results  Component Value  Date   CREATININE 0.90 09/18/2020   CREATININE 0.95 05/31/2020   CREATININE 0.96 11/29/2019   Lab Results  Component Value Date   EGFR 62 05/31/2020   Current Barriers:  Knowledge Deficits related to basic Diabetes pathophysiology and self care/management Case Manager Clinical Goal(s):  Patient will demonstrate improved adherence to prescribed treatment plan for diabetes self care/management as evidenced by:  daily monitoring and recording of CBG  adherence to ADA/ carb modified diet exercise 3-5 days/week adherence to prescribed medication regimen Interventions:  09/12/20 completed successful outbound call with patient  Provided education to patient about basic DM disease process Review of patient status, including review of consultant's reports, relevant laboratory and other test results, and medications completed. Reviewed medications with patient and discussed importance of medication adherence Educated patient on dietary and exercise recommendations; daily glycemic control FBS 80-130, <180 after meals;15'15' rule Advised patient, providing education and rationale, to check cbg daily before meals and at  bedtime and record, calling the CCM team and or PCP for findings outside established parameters Reviewed scheduled/upcoming provider appointments including: next PCP follow up appointment scheduled for 09/20/20 '@3' :15 PM  Mailed printed educational materials related to Diabetes management  Discussed plans with patient for ongoing care management follow up and provided patient with direct contact information for care management team Patient Goals/Self-Care Activities Self administers oral medications as prescribed Self administers insulin as prescribed Self administers injectable DM medication (Ozempic, [name]) as prescribed Attends all scheduled provider appointments Checks blood sugars as prescribed and utilize hyper and hypoglycemia protocol as needed Adheres to prescribed  ADA/carb modified - Perform daily foot checks - Schedule eye and dental exams - enter blood sugar readings and medication or insulin into daily log - take the blood sugar log to all doctor visits - take the blood sugar meter to all doctor visits    Follow Up Plan: Telephone follow up appointment with care management team member scheduled for: 11/05/20    Care Plan : Hypertension (Adult)  Updates made by Lynne Logan, RN since 09/12/2020 12:00 AM     Problem: Hypertension (Hypertension)   Priority: High     Long-Range Goal: Hypertension Monitored   Start Date: 02/01/2020  Expected End Date: 01/31/2021  Recent Progress: On track  Priority: High  Note:   Objective:  Last practice recorded BP readings:  BP Readings from Last 3 Encounters:  09/18/20 (!) 181/75  09/04/20 (!) 172/75  07/17/20 132/72   Most recent eGFR/CrCl:  Lab Results  Component Value Date   EGFR 62 05/31/2020    No components found for: CRCL Current Barriers:  Knowledge Deficits related to basic understanding of hypertension pathophysiology and self care management Case Manager Clinical Goal(s):  Patient will verbalize understanding of plan for hypertension management Interventions:  09/12/20 completed successful outbound call to patient  Collaboration with Glendale Chard, MD regarding development and update of comprehensive plan of care as evidenced by provider attestation and co-signature Inter-disciplinary care team collaboration (see longitudinal plan of care) Evaluation of current treatment plan related to hypertension self management and patient's adherence to plan as established by provider. Reviewed medications with patient and discussed importance of compliance Educated patient on dietary and exercise recommendations, including restricting Sodium intake  Encourage continued use of home blood pressure monitoring and recording in blood pressure log; include symptoms of hypotension or potential  medication side effects in log Mailed printed educational materials related to What is High Blood Pressure?; Why Should I Restrict Sodium?  Discussed plans with patient for ongoing care management follow up and provided patient with direct contact information for care management team Patient Goals/Self-Care Activities Self administers medications as prescribed Attends all scheduled provider appointments Calls provider office for new concerns, questions, or BP outside discussed parameters Checks BP and records as discussed Follows a low sodium diet/DASH diet - check blood pressure 3 times per week - write blood pressure results in a log or diary  Follow Up Plan: Telephone follow up appointment with care management team member scheduled for: 11/05/20    Care Plan : General Plan of Care (Adult)  Updates made by Lynne Logan, RN since 09/12/2020 12:00 AM     Problem: Quality of Life (General Plan of Care)   Priority: High     Long-Range Goal: Quality of Life Maintained   Start Date: 02/01/2020  Expected End Date: 01/31/2021  Recent Progress: On track  Priority: High  Note:   Current Barriers:  Ineffective  Self Health Maintenance Currently UNABLE TO independently self manage needs related to chronic health conditions.  Knowledge Deficits related to short term plan for care coordination needs and long term plans for chronic disease management needs Nurse Case Manager Clinical Goal(s):  Patient will work with care management team to address care coordination and chronic disease management needs related to Disease Management Educational Needs Care Coordination Medication Management and Education Psychosocial Support   Interventions:  03/27/20 inbound call completed with patient  Answered questions related to effective health maintenance Provided active listening to patient and validated her concerns related to routine HIV screening for effective health maintenance and to ensure quality  of life is optimal  Determined patient will discuss with PCP at next scheduled visit, recommendations for HIV testing  Patient Goals/Self Care/Activities:  - discuss my treatment options with the doctor or nurse - learn something new by asking, reading and searching the Internet every day - make shared treatment decisions with doctor  - increase water intake to 64oz-80oz per day - complete full course of antibiotics as directed  - contact PCP for reoccurring symptoms   Follow Up Plan: Telephone follow up appointment with care management team member scheduled for: 11/05/20    Care Plan : Elective Surgery  Updates made by Lynne Logan, RN since 09/12/2020 12:00 AM     Problem: Panniculectomy Procedure   Priority: Medium     Long-Range Goal: Manage stretched out, excess fat and overhanging skin from your abdomen   Start Date: 02/29/2020  Expected End Date: 12/24/2020  Recent Progress: On track  Priority: Medium  Note:   Current Barriers:  Ineffective Self Health Maintenance Currently UNABLE TO independently self manage needs related to chronic health conditions.  Knowledge Deficits related to short term plan for care coordination needs and long term plans for chronic disease management needs Nurse Case Manager Clinical Goal(s):  Patient will work with care management team to address care coordination and chronic disease management needs related to Disease Management Educational Needs Care Coordination Medication Management and Education Psychosocial Support   Interventions:  09/12/20 completed successful outbound call with patient  Determined patient will undergo a Panniculectomy procedure by Dr. Marla Roe on 09/26/20 Determined this procedure is scheduled for outpatient but patient may need inpatient services pending hemodynamic stability following the procedure Determined and discussed the following appointments related to this procedure:  09/04/20-Pre-Op visit for Panniculectomy  procedure (Dr. Marla Roe) 09/24/20-Lab visit for Panniculectomy procedure 09/26/20-Panniculectomy outpatient/inpatient procedure  09/30/20-Post-Op Panniculectomy follow up appointment with Dr. Marla Roe  10/12/20-Post-Op Panniculectomy follow up appointments with Roetta Sessions, PA-C  Determined patient will have a friend available and use an agency referred by [facility] to assist her as needed following surgery Discussed plans with patient for ongoing care management follow up and provided patient with direct contact information for care management team. Patient Self-Care Activities:  Keep the following appointments related to pre-op and post-op Panniculectomy 09/04/20-Pre-Op visit for Panniculectomy procedure (Dr. Marla Roe) 09/24/20-Lab visit for Panniculectomy procedure 09/26/20-Panniculectomy outpatient/inpatient procedure  09/30/20-Post-Op Panniculectomy follow up appointment with Dr. Marla Roe  10/12/20-Post-Op Panniculectomy follow up appointments with Roetta Sessions, PA-C               Follow Up Plan: Telephone follow up appointment with care management team member scheduled for:  10/03/20    Plan:Telephone follow up appointment with care management team member scheduled for:  10/03/20  Barb Merino, RN, BSN, CCM Care Management Coordinator [facility]  Direct Phone: [phone]

## 2020-09-19 NOTE — Patient Instructions (Signed)
Goals Addressed      Complete Panniculectomy   On track    Timeframe:  Long-Range Goal Priority:  Medium Start Date: 02/29/20                            Expected End Date: 10/24/20  Next Follow up date: 10/03/20  Patient Self-Care Activities:  Keep the following appointments related to pre-op and post-op Panniculectomy 09/04/20-Pre-Op visit for Panniculectomy procedure (Dr. Marla Roe) 09/24/20-Lab visit for Panniculectomy procedure 09/26/20-Panniculectomy outpatient/inpatient procedure  09/30/20-Post-Op Panniculectomy follow up appointment with Dr. Marla Roe  10/12/20-Post-Op Panniculectomy follow up appointments with Roetta Sessions, PA-C       Lifestyle Change-Hypertension       Timeframe:  Long-Range Goal Priority:  High Start Date: 02/01/20                            Expected End Date: 01/31/21                     Follow Up Date: 11/05/20    - agree to work together to make changes - ask questions to understand - learn about high blood pressure    Why is this important?   The changes that you are asked to make may be hard to do.  This is especially true when the changes are life-long.  Knowing why it is important to you is the first step.  Working on the change with your family or support person helps you not feel alone.  Reward yourself and family or support person when goals are met. This can be an activity you choose like bowling, hiking, biking, swimming or shooting hoops.     Notes:      Monitor and Manage My Blood Sugar-Diabetes Type 2   On track    Timeframe:  Long-Range Goal Priority:  High Start Date: 02/01/20                            Expected End Date: 01/31/21                   Follow Up Date: 11/05/20   - check blood sugar at prescribed times - check blood sugar before and after exercise - check blood sugar if I feel it is too high or too low - enter blood sugar readings and medication or insulin into daily log - take the blood sugar log to all doctor visits -  take the blood sugar meter to all doctor visits    Why is this important?   Checking your blood sugar at home helps to keep it from getting very high or very low.  Writing the results in a diary or log helps the doctor know how to care for you.  Your blood sugar log should have the time, date and the results.  Also, write down the amount of insulin or other medicine that you take.  Other information, like what you ate, exercise done and how you were feeling, will also be helpful.     Notes:      Track and Manage My Blood Pressure-Hypertension   On track    Timeframe:  Long-Range Goal Priority:  High Start Date: 02/01/20  Expected End Date: 01/31/21                  Follow Up Date: 11/05/20   - check blood pressure 3 times per week - write blood pressure results in a log or diary  - adhere to dietary and exercise recommendations    Why is this important?   You won't feel high blood pressure, but it can still hurt your blood vessels.  High blood pressure can cause heart or kidney problems. It can also cause a stroke.  Making lifestyle changes like losing a Lynnsey Barbara weight or eating less salt will help.  Checking your blood pressure at home and at different times of the day can help to control blood pressure.  If the doctor prescribes medicine remember to take it the way the doctor ordered.  Call the office if you cannot afford the medicine or if there are questions about it.     Notes:

## 2020-09-20 ENCOUNTER — Telehealth: Payer: Medicare HMO

## 2020-09-20 ENCOUNTER — Ambulatory Visit (INDEPENDENT_AMBULATORY_CARE_PROVIDER_SITE_OTHER): Payer: Medicare HMO

## 2020-09-20 ENCOUNTER — Ambulatory Visit (INDEPENDENT_AMBULATORY_CARE_PROVIDER_SITE_OTHER): Payer: Medicare HMO | Admitting: Internal Medicine

## 2020-09-20 ENCOUNTER — Other Ambulatory Visit: Payer: Self-pay

## 2020-09-20 ENCOUNTER — Ambulatory Visit: Payer: Medicare HMO | Admitting: Adult Health

## 2020-09-20 ENCOUNTER — Encounter: Payer: Self-pay | Admitting: Internal Medicine

## 2020-09-20 VITALS — BP 140/60 | HR 72 | Temp 97.8°F | Ht 64.0 in | Wt 214.6 lb

## 2020-09-20 DIAGNOSIS — I129 Hypertensive chronic kidney disease with stage 1 through stage 4 chronic kidney disease, or unspecified chronic kidney disease: Secondary | ICD-10-CM

## 2020-09-20 DIAGNOSIS — H6123 Impacted cerumen, bilateral: Secondary | ICD-10-CM | POA: Diagnosis not present

## 2020-09-20 DIAGNOSIS — H9193 Unspecified hearing loss, bilateral: Secondary | ICD-10-CM

## 2020-09-20 DIAGNOSIS — E1122 Type 2 diabetes mellitus with diabetic chronic kidney disease: Secondary | ICD-10-CM

## 2020-09-20 DIAGNOSIS — Z0000 Encounter for general adult medical examination without abnormal findings: Secondary | ICD-10-CM

## 2020-09-20 DIAGNOSIS — N182 Chronic kidney disease, stage 2 (mild): Secondary | ICD-10-CM

## 2020-09-20 DIAGNOSIS — Z6836 Body mass index (BMI) 36.0-36.9, adult: Secondary | ICD-10-CM

## 2020-09-20 DIAGNOSIS — M793 Panniculitis, unspecified: Secondary | ICD-10-CM

## 2020-09-20 NOTE — Progress Notes (Signed)
This visit occurred during the SARS-CoV-2 public health emergency.  Safety protocols were in place, including screening questions prior to the visit, additional usage of staff PPE, and extensive cleaning of exam room while observing appropriate contact time as indicated for disinfecting solutions.  Subjective:   Teresa Golden is a 75 y.o. female who presents for Medicare Annual (Subsequent) preventive examination.  Review of Systems     Cardiac Risk Factors include: advanced age (>75mn, >>63women);diabetes mellitus;obesity (BMI >30kg/m2)     Objective:    Today's Vitals   09/20/20 1414  BP: 140/60  Pulse: 72  Temp: 97.8 F (36.6 C)  TempSrc: Oral  SpO2: 99%  Weight: 214 lb 9.6 oz (97.3 kg)  Height: '5\' 4"'$  (1.626 m)   Body mass index is 36.84 kg/m.  Advanced Directives 09/20/2020 09/18/2020 08/18/2019 02/08/2019 07/15/2018 02/04/2018 07/30/2017  Does Patient Have a Medical Advance Directive? Yes Yes Yes Yes Yes Yes Yes  Type of AParamedicof ANovatoLiving will HKemptonLiving will HBranfordLiving will HBridgeportLiving will HDisneyLiving will HLeavenworthwill  Does patient want to make changes to medical advance directive? - No - Patient declined - - No - Patient declined No - Patient declined No - Patient declined  Copy of H[place]in Chart? No - copy requested - No - copy requested No - copy requested No - copy requested No - copy requested -  Would patient like information on creating a medical advance directive? - - - - - - -    Current Medications (verified) Outpatient Encounter Medications as of 09/20/2020  Medication Sig   amLODipine (NORVASC) 5 MG tablet Take 1 tablet by mouth once daily (Patient taking differently: Take 5 mg by mouth at bedtime.)   Biotin 10000 MCG TABS Take 10,000 mcg by mouth daily.   Colchicine 0.6 MG CAPS  TAKE 1 CAPSULE DAILY AS    NEEDED (Patient taking differently: Take 0.6 mg by mouth daily as needed (gout).)   diclofenac Sodium (VOLTAREN) 1 % GEL APPLY 2 GRAMS TO AFFECTED AREA(S) 4 TIMES DAILY AS NEEDED (Patient taking differently: Apply 2 g topically 4 (four) times daily as needed (pain).)   gabapentin (NEURONTIN) 300 MG capsule Take 300-600 mg by mouth at bedtime.   glucose blood (ONETOUCH VERIO) test strip Use as instructed to check blood sugars 2 times per day dx: e11.65   insulin degludec (TRESIBA FLEXTOUCH) 100 UNIT/ML SOPN FlexTouch Pen Inject 0.09 mLs (9 Units total) into the skin daily at 10 pm. (Patient taking differently: Inject 12 Units into the skin daily at 10 pm.)   losartan (COZAAR) 100 MG tablet Take 100 mg by mouth daily.   metoprolol succinate (TOPROL-XL) 25 MG 24 hr tablet 1 tablet by mouth daily at bedtime (Patient taking differently: Take 25 mg by mouth daily. 1 tablet by mouth daily at bedtime)   naproxen sodium (ANAPROX) 220 MG tablet Take 440 mg by mouth at bedtime. 2 tablets every night at bedtime   OneTouch Delica Lancets [id number] Use as instructed to check blood sugars 2 times per day dx: e11.65   OZEMPIC, 1 MG/DOSE, 2 MG/1.5ML SOPN INJECT INTO SKIN ONCE A WEEK (Patient taking differently: Inject 1 mg into the skin every Saturday. '1mg'$ )   simvastatin (ZOCOR) 10 MG tablet Take 1 tablet (10 mg total) by mouth daily.   SYNTHROID 25 MCG tablet Take 0.5 tablet (12.5  mcg) by mouth on Mondays through Fridays, then take 1 tablet (25 mcg) by mouth on Saturdays and Sundays. (Patient taking differently: Take 12.5-25 mcg by mouth See admin instructions. Take 0.5 tablet (12.5 mcg) by mouth on Mondays through Fridays, then take 1 tablet (25 mcg) by mouth on Saturdays and Sundays.)   B Complex-C (B-COMPLEX WITH VITAMIN C) tablet Take 1 tablet by mouth daily. (Patient not taking: Reported on 09/20/2020)   Cholecalciferol 5000 units TABS Take 5,000 Units by mouth daily. (Patient not taking:  Reported on 09/20/2020)   Cranberry-Vitamin C 250-60 MG CAPS Take 1 tablet by mouth daily. (Patient not taking: Reported on 09/20/2020)   enoxaparin (LOVENOX) 40 MG/0.4ML injection Inject 40 mg into the skin daily. For 7 days after procedure (Patient not taking: Reported on 09/20/2020)   Multiple Vitamins-Minerals (MULTIVITAMIN WITH MINERALS) tablet Take 1 tablet by mouth daily. Women's 50+ (Patient not taking: Reported on 09/20/2020)   ondansetron (ZOFRAN) 4 MG tablet Take 1 tablet (4 mg total) by mouth every 8 (eight) hours as needed for nausea or vomiting. (Patient not taking: Reported on 09/20/2020)   No facility-administered encounter medications on file as of 09/20/2020.    Allergies (verified) Meloxicam and Other   History: Past Medical History:  Diagnosis Date   Arthritis    Chronic kidney disease    self reports ckd stage 3    Depression    Diabetes mellitus, type II, insulin dependent ([place])    With neurologic complications. 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    Hypertensive nephropathy 07/05/2018   Hypothyroidism    Hypothyroidism 02/06/2018   Morbid obesity with BMI of 50.0-59.9, adult ([place])    Scoliosis    Past Surgical History:  Procedure Laterality Date   ABDOMINAL HYSTERECTOMY     uterus   CHOLECYSTECTOMY     DIAGNOSTIC LAPAROSCOPY     DILATION AND CURETTAGE OF UTERUS     EYE SURGERY     cataract extraction bilateral    JOINT REPLACEMENT     Left knee   KNEE ARTHROPLASTY Right 07/30/2017   Procedure: RIGHT TOTAL KNEE ARTHROPLASTY WITH COMPUTER NAVIGATION;  Surgeon: Rod Can, MD;  Location: [facility];  Service: Orthopedics;  Laterality: Right;  Needs RNFA   LAPAROSCOPIC GASTRIC BANDING     and reversal   LAPAROSCOPIC  GASTRIC SLEEVE RESECTION N/A 08/26/2016   Procedure: LAPAROSCOPIC GASTRIC SLEEVE RESECTION WITH UPPER ENOD;  Surgeon: Johnathan Hausen, MD;  Location: [facility];  Service: General;  Laterality: N/A;   TONSILLECTOMY     TRANSTHORACIC ECHOCARDIOGRAM  11/2013   EF 65-70%. Normal diastolic Fxn.  Normal Valves.   Family History  Problem Relation Age of Onset   Heart attack Mother    Heart failure Mother    Stroke Father    Social History   Socioeconomic History   Marital status: Single    Spouse name: Not on file   Number of children: Not on file   Years of education: Not on file   Highest education level: Not on file  Occupational History   Occupation: retired  Tobacco Use   Smoking status: Former    Packs/day: 0.25    Years: 10.00    Pack years:  2.50    Types: Cigarettes   Smokeless tobacco: Never   Tobacco comments:    quit 20 years ago  Vaping Use   Vaping Use: Never used  Substance and Sexual Activity   Alcohol use: Not Currently    Alcohol/week: 1.0 standard drink    Types: 1 Glasses of wine per week    Comment: occasional   Drug use: No   Sexual activity: Yes  Other Topics Concern   Not on file  Social History Narrative   Not on file   Social Determinants of Health   Financial Resource Strain: Low Risk    Difficulty of Paying Living Expenses: Not hard at all  Food Insecurity: No Food Insecurity   Worried About Charity fundraiser in the Last Year: Never true   Ran Out of Food in the Last Year: Never true  Transportation Needs: No Transportation Needs   Lack of Transportation (Medical): No   Lack of Transportation (Non-Medical): No  Physical Activity: Insufficiently Active   Days of Exercise per Week: 3 days   Minutes of Exercise per Session: 40 min  Stress: No Stress Concern Present   Feeling of Stress : Not at all  Social Connections: Not on file    Tobacco Counseling Counseling given: Not Answered Tobacco comments: quit 20 years ago   Clinical  Intake:  Pre-visit preparation completed: Yes  Pain : No/denies pain     Nutritional Status: BMI > 30  Obese Nutritional Risks: None Diabetes: Yes  How often do you need to have someone help you when you read instructions, pamphlets, or other written materials from your doctor or pharmacy?: 1 - Never What is the last grade level you completed in school?: some college  Diabetic? Yes Nutrition Risk Assessment:  Has the patient had any N/V/D within the last 2 months?  No  Does the patient have any non-healing wounds?  No  Has the patient had any unintentional weight loss or weight gain?  No   Diabetes:  Is the patient diabetic?  Yes  If diabetic, was a CBG obtained today?  No  Did the patient bring in their glucometer from home?  No  How often do you monitor your CBG's? Twice daily.   Financial Strains and Diabetes Management:  Are you having any financial strains with the device, your supplies or your medication? No .  Does the patient want to be seen by Chronic Care Management for management of their diabetes?  No  Would the patient like to be referred to a Nutritionist or for Diabetic Management?  No   Diabetic Exams:  Diabetic Eye Exam: Completed 01/11/2020 Diabetic Foot Exam: Completed 02/09/2020   Interpreter Needed?: No  Information entered by :: NAllen LPN   Activities of Daily Living In your present state of health, do you have any difficulty performing the following activities: 09/20/2020 09/18/2020  Hearing? N N  Vision? N N  Difficulty concentrating or making decisions? N N  Walking or climbing stairs? Y Y  Comment - yes "I have neuropathy"  Dressing or bathing? N N  Doing errands, shopping? N N  Preparing Food and eating ? N -  Using the Toilet? N -  In the past six months, have you accidently leaked urine? N -  Do you have problems with loss of bowel control? Y -  Managing your Medications? N -  Managing your Finances? N -  Housekeeping or  managing your Housekeeping? N -  Some recent  data might be hidden    Patient Care Team: Glendale Chard, MD as PCP - General (Internal Medicine) Elouise Munroe, MD as PCP - Cardiology (Cardiology) Daneen Schick as Social Worker [place], Claudette Stapler, RN as Case Manager Mayford Knife, [facility] (Pharmacist)  Indicate any recent Medical Services you may have received from other than [facility] providers in the past year (date may be approximate).     Assessment:   This is a routine wellness examination for [place].  Hearing/Vision screen Vision Screening - Comments:: Regular eye exams, [facility]  Dietary issues and exercise activities discussed: Current Exercise Habits: Home exercise routine, Time (Minutes): 45, Frequency (Times/Week): 3, Weekly Exercise (Minutes/Week): 135   Goals Addressed             This Visit's Progress    Patient Stated       09/20/2020, lose more weight, wants to weigh 170 pounds       Depression Screen PHQ 2/9 Scores 09/20/2020 03/15/2020 08/18/2019 02/08/2019 08/31/2018 07/15/2018 07/05/2018  PHQ - 2 Score 0 2 0 0 0 0 0  PHQ- 9 Score - 7 3 0 - - -    Fall Risk Fall Risk  08/18/2019 02/08/2019 08/31/2018 08/31/2018 08/31/2018  Falls in the past year? 0 0 - 0 0  Number falls in past yr: - - - 0 -  Comment - - - - -  Injury with Fall? - - - - -  Risk for fall due to : Medication side effect;Impaired balance/gait Impaired balance/gait;Impaired mobility;Medication side effect Impaired balance/gait Orthopedic patient -  Follow up Falls evaluation completed;Education provided;Falls prevention discussed Falls evaluation completed;Education provided;Falls prevention discussed Falls evaluation completed;Education provided Falls evaluation completed;Education provided;Falls prevention discussed -    FALL RISK PREVENTION PERTAINING TO THE HOME:  Any stairs in or around the home? No  If so, are there any without handrails?  N/a Home free of loose throw rugs in walkways, pet  beds, electrical cords, etc? Yes  Adequate lighting in your home to reduce risk of falls? Yes   ASSISTIVE DEVICES UTILIZED TO PREVENT FALLS:  Life alert? No  Use of a cane, walker or w/c? Yes  Grab bars in the bathroom? Yes  Shower chair or bench in shower? Yes  Elevated toilet seat or a handicapped toilet? Yes   TIMED UP AND GO:  Was the test performed? No .    Gait slow and steady with assistive device  Cognitive Function:     6CIT Screen 09/20/2020 08/18/2019 02/08/2019 02/04/2018  What Year? 0 points 0 points 0 points 0 points  What month? 0 points 0 points 0 points 0 points  What time? 0 points 0 points 0 points 0 points  Count back from 20 0 points 0 points 0 points 0 points  Months in reverse 0 points 0 points 0 points 0 points  Repeat phrase 0 points 0 points 0 points 0 points  Total Score 0 0 0 0    Immunizations Immunization History  Administered Date(s) Administered   Fluad Quad(high Dose 65+) 11/10/2018, 11/29/2019   Influenza, High Dose Seasonal PF 12/21/2017, 11/10/2018   Influenza,inj,quad, With Preservative 02/25/2016   Influenza-Unspecified 10/25/2016   Moderna Sars-Covid-2 Vaccination 03/28/2019, 04/25/2019, 01/02/2020, 07/06/2020   Pneumococcal Polysaccharide-23 11/02/2013   Pneumococcal-Unspecified 11/24/2016   Zoster Recombinat (Shingrix) 11/03/2018, 12/31/2018    TDAP status: Up to date  Flu Vaccine status: Up to date  Pneumococcal vaccine status: Up to date  Covid-19 vaccine status: Completed vaccines  Qualifies for Shingles Vaccine? Yes   Zostavax completed No   Shingrix Completed?: Yes  Screening Tests Health Maintenance  Topic Date Due   PNA vac Low Risk Adult (2 of 2 - PCV13) 11/24/2017   INFLUENZA VACCINE  09/24/2020   COVID-19 Vaccine (5 - Booster for Moderna series) 11/06/2020   OPHTHALMOLOGY EXAM  01/10/2021   FOOT EXAM  02/08/2021   HEMOGLOBIN A1C  03/21/2021   TETANUS/TDAP  07/03/2025   DEXA SCAN  Completed   Hepatitis  C Screening  Completed   Zoster Vaccines- Shingrix  Completed   HPV VACCINES  Aged Out    Health Maintenance  Health Maintenance Due  Topic Date Due   PNA vac Low Risk Adult (2 of 2 - PCV13) 11/24/2017    Colorectal cancer screening: Type of screening: Colonoscopy. Completed 09/13/2012. Repeat every 10 years  Mammogram status: Completed 07/26/2020. Repeat every year  Bone Density status: Completed 04/26/2018.   Lung Cancer Screening: (Low Dose CT Chest recommended if Age 39-80 years, 30 pack-year currently smoking OR have quit w/in 15years.) does not qualify.   Lung Cancer Screening Referral: no  Additional Screening:  Hepatitis C Screening: does qualify; Completed 05/31/2020  Vision Screening: Recommended annual ophthalmology exams for early detection of glaucoma and other disorders of the eye. Is the patient up to date with their annual eye exam?  Yes  Who is the provider or what is the name of the office in which the patient attends annual eye exams? [facility] If pt is not established with a provider, would they like to be referred to a provider to establish care? No .   Dental Screening: Recommended annual dental exams for proper oral hygiene  Community Resource Referral / Chronic Care Management: CRR required this visit?  No   CCM required this visit?  No      Plan:     I have personally reviewed and noted the following in the patient's chart:   Medical and social history Use of alcohol, tobacco or illicit drugs  Current medications and supplements including opioid prescriptions.  Functional ability and status Nutritional status Physical activity Advanced directives List of other physicians Hospitalizations, surgeries, and ER visits in previous 12 months Vitals Screenings to include cognitive, depression, and falls Referrals and appointments  In addition, I have reviewed and discussed with patient certain preventive protocols, quality metrics, and best  practice recommendations. A written personalized care plan for preventive services as well as general preventive health recommendations were provided to patient.     Kellie Simmering, LPN   [id number]   Nurse Notes:

## 2020-09-20 NOTE — Patient Instructions (Signed)
[name] , Thank you for taking time to come for your Medicare Wellness Visit. I appreciate your ongoing commitment to your health goals. Please review the following plan we discussed and let me know if I can assist you in the future.   Screening recommendations/referrals: Colonoscopy: completed 09/13/2012 Mammogram: completed 07/26/2020 Bone Density: completed 04/26/2018 Recommended yearly ophthalmology/optometry visit for glaucoma screening and checkup Recommended yearly dental visit for hygiene and checkup  Vaccinations: Influenza vaccine: completed 11/29/2019, due 09/24/2020 Pneumococcal vaccine: completed 08/03/2020 Tdap vaccine: completed 07/04/2015, due 07/03/2025 Shingles vaccine: completed   Covid-19: 07/06/2020, 01/02/2020, 04/25/2019, 03/28/2019  Advanced directives: Please bring a copy of your POA (Power of Attorney) and/or Living Will to your next appointment.   Conditions/risks identified: none  Next appointment: Follow up in one year for your annual wellness visit    Preventive Care 65 Years and Older, Female Preventive care refers to lifestyle choices and visits with your health care provider that can promote health and wellness. What does preventive care include? A yearly physical exam. This is also called an annual well check. Dental exams once or twice a year. Routine eye exams. Ask your health care provider how often you should have your eyes checked. Personal lifestyle choices, including: Daily care of your teeth and gums. Regular physical activity. Eating a healthy diet. Avoiding tobacco and drug use. Limiting alcohol use. Practicing safe sex. Taking low-dose aspirin every day. Taking vitamin and mineral supplements as recommended by your health care provider. What happens during an annual well check? The services and screenings done by your health care provider during your annual well check will depend on your age, overall health, lifestyle risk factors, and family  history of disease. Counseling  Your health care provider may ask you questions about your: Alcohol use. Tobacco use. Drug use. Emotional well-being. Home and relationship well-being. Sexual activity. Eating habits. History of falls. Memory and ability to understand (cognition). Work and work Statistician. Reproductive health. Screening  You may have the following tests or measurements: Height, weight, and BMI. Blood pressure. Lipid and cholesterol levels. These may be checked every 5 years, or more frequently if you are over 45 years old. Skin check. Lung cancer screening. You may have this screening every year starting at age 40 if you have a 30-pack-year history of smoking and currently smoke or have quit within the past 15 years. Fecal occult blood test (FOBT) of the stool. You may have this test every year starting at age 46. Flexible sigmoidoscopy or colonoscopy. You may have a sigmoidoscopy every 5 years or a colonoscopy every 10 years starting at age 3. Hepatitis C blood test. Hepatitis B blood test. Sexually transmitted disease (STD) testing. Diabetes screening. This is done by checking your blood sugar (glucose) after you have not eaten for a while (fasting). You may have this done every 1-3 years. Bone density scan. This is done to screen for osteoporosis. You may have this done starting at age 72. Mammogram. This may be done every 1-2 years. Talk to your health care provider about how often you should have regular mammograms. Talk with your health care provider about your test results, treatment options, and if necessary, the need for more tests. Vaccines  Your health care provider may recommend certain vaccines, such as: Influenza vaccine. This is recommended every year. Tetanus, diphtheria, and acellular pertussis (Tdap, Td) vaccine. You may need a Td booster every 10 years. Zoster vaccine. You may need this after age 76. Pneumococcal 13-valent conjugate (PCV13)  vaccine. One dose is recommended after age 24. Pneumococcal polysaccharide (PPSV23) vaccine. One dose is recommended after age 53. Talk to your health care provider about which screenings and vaccines you need and how often you need them. This information is not intended to replace advice given to you by your health care provider. Make sure you discuss any questions you have with your health care provider. Document Released: 03/09/2015 Document Revised: 10/31/2015 Document Reviewed: 12/12/2014 Elsevier Interactive Patient Education  2017 [place] Prevention in the Home Falls can cause injuries. They can happen to people of all ages. There are many things you can do to make your home safe and to help prevent falls. What can I do on the outside of my home? Regularly fix the edges of walkways and driveways and fix any cracks. Remove anything that might make you trip as you walk through a door, such as a raised step or threshold. Trim any bushes or trees on the path to your home. Use bright outdoor lighting. Clear any walking paths of anything that might make someone trip, such as rocks or tools. Regularly check to see if handrails are loose or broken. Make sure that both sides of any steps have handrails. Any raised decks and porches should have guardrails on the edges. Have any leaves, snow, or ice cleared regularly. Use sand or salt on walking paths during winter. Clean up any spills in your garage right away. This includes oil or grease spills. What can I do in the bathroom? Use night lights. Install grab bars by the toilet and in the tub and shower. Do not use towel bars as grab bars. Use non-skid mats or decals in the tub or shower. If you need to sit down in the shower, use a plastic, non-slip stool. Keep the floor dry. Clean up any water that spills on the floor as soon as it happens. Remove soap buildup in the tub or shower regularly. Attach bath mats securely with  double-sided non-slip rug tape. Do not have throw rugs and other things on the floor that can make you trip. What can I do in the bedroom? Use night lights. Make sure that you have a light by your bed that is easy to reach. Do not use any sheets or blankets that are too big for your bed. They should not hang down onto the floor. Have a firm chair that has side arms. You can use this for support while you get dressed. Do not have throw rugs and other things on the floor that can make you trip. What can I do in the kitchen? Clean up any spills right away. Avoid walking on wet floors. Keep items that you use a lot in easy-to-reach places. If you need to reach something above you, use a strong step stool that has a grab bar. Keep electrical cords out of the way. Do not use floor polish or wax that makes floors slippery. If you must use wax, use non-skid floor wax. Do not have throw rugs and other things on the floor that can make you trip. What can I do with my stairs? Do not leave any items on the stairs. Make sure that there are handrails on both sides of the stairs and use them. Fix handrails that are broken or loose. Make sure that handrails are as long as the stairways. Check any carpeting to make sure that it is firmly attached to the stairs. Fix any carpet that is loose or  worn. Avoid having throw rugs at the top or bottom of the stairs. If you do have throw rugs, attach them to the floor with carpet tape. Make sure that you have a light switch at the top of the stairs and the bottom of the stairs. If you do not have them, ask someone to add them for you. What else can I do to help prevent falls? Wear shoes that: Do not have high heels. Have rubber bottoms. Are comfortable and fit you well. Are closed at the toe. Do not wear sandals. If you use a stepladder: Make sure that it is fully opened. Do not climb a closed stepladder. Make sure that both sides of the stepladder are locked  into place. Ask someone to hold it for you, if possible. Clearly mark and make sure that you can see: Any grab bars or handrails. First and last steps. Where the edge of each step is. Use tools that help you move around (mobility aids) if they are needed. These include: Canes. Walkers. Scooters. Crutches. Turn on the lights when you go into a dark area. Replace any light bulbs as soon as they burn out. Set up your furniture so you have a clear path. Avoid moving your furniture around. If any of your floors are uneven, fix them. If there are any pets around you, be aware of where they are. Review your medicines with your doctor. Some medicines can make you feel dizzy. This can increase your chance of falling. Ask your doctor what other things that you can do to help prevent falls. This information is not intended to replace advice given to you by your health care provider. Make sure you discuss any questions you have with your health care provider. Document Released: 12/07/2008 Document Revised: 07/19/2015 Document Reviewed: 03/17/2014 Elsevier Interactive Patient Education  2017 [employer].

## 2020-09-20 NOTE — Patient Instructions (Signed)
sDiabetes Mellitus and Exercise Exercising regularly is important for overall health, especially for people who have diabetes mellitus. Exercising is not only about losing weight. It has many other health benefits, such as increasing muscle strength and bone density and reducing body fat and stress. This leads to improved fitness, flexibility, andendurance, all of which result in better overall health. What are the benefits of exercise if I have diabetes? Exercise has many benefits for people with diabetes. They include: Helping to lower and control blood sugar (glucose). Helping the body to respond better to the hormone insulin by improving insulin sensitivity. Reducing how much insulin the body needs. Lowering the risk for heart disease by: Lowering "bad" cholesterol and triglyceride levels. Increasing "good" cholesterol levels. Lowering blood pressure. Lowering blood glucose levels. What is my activity plan? Your health care provider or certified diabetes educator can help you make a plan for the type and frequency of exercise that works for you. This is called your activity plan. Be sure to: Get at least 150 minutes of medium-intensity or high-intensity exercise each week. Exercises may include brisk walking, biking, or water aerobics. Do stretching and strengthening exercises, such as yoga or weight lifting, at least 2 times a week. Spread out your activity over at least 3 days of the week. Get some form of physical activity each day. Do not go more than 2 days in a row without some kind of physical activity. Avoid being inactive for more than 90 minutes at a time. Take frequent breaks to walk or stretch. Choose exercises or activities that you enjoy. Set realistic goals. Start slowly and gradually increase your exercise intensity over time. How do I manage my diabetes during exercise?  Monitor your blood glucose Check your blood glucose before and after exercising. If your blood  glucose is: 240 mg/dL (13.3 mmol/L) or higher before you exercise, check your urine for ketones. These are chemicals created by the liver. If you have ketones in your urine, do not exercise until your blood glucose returns to normal. 100 mg/dL (5.6 mmol/L) or lower, eat a snack containing 15-20 grams of carbohydrate. Check your blood glucose 15 minutes after the snack to make sure that your glucose level is above 100 mg/dL (5.6 mmol/L) before you start your exercise. Know the symptoms of low blood glucose (hypoglycemia) and how to treat it. Your risk for hypoglycemia increases during and after exercise. Follow these tips and your health care provider's instructions Keep a carbohydrate snack that is fast-acting for use before, during, and after exercise to help prevent or treat hypoglycemia. Avoid injecting insulin into areas of the body that are going to be exercised. For example, avoid injecting insulin into: Your arms, when you are about to play tennis. Your legs, when you are about to go jogging. Keep records of your exercise habits. Doing this can help you and your health care provider adjust your diabetes management plan as needed. Write down: Food that you eat before and after you exercise. Blood glucose levels before and after you exercise. The type and amount of exercise you have done. Work with your health care provider when you start a new exercise or activity. He or she may need to: Make sure that the activity is safe for you. Adjust your insulin, other medicines, and food that you eat. Drink plenty of water while you exercise. This prevents loss of water (dehydration) and problems caused by a lot of heat in the body (heat stroke). Where to find more information  American Diabetes Association: www.diabetes.org Summary Exercising regularly is important for overall health, especially for people who have diabetes mellitus. Exercising has many health benefits. It increases muscle strength  and bone density and reduces body fat and stress. It also lowers and controls blood glucose. Your health care provider or certified diabetes educator can help you make an activity plan for the type and frequency of exercise that works for you. Work with your health care provider to make sure any new activity is safe for you. Also work with your health care provider to adjust your insulin, other medicines, and the food you eat. This information is not intended to replace advice given to you by your health care provider. Make sure you discuss any questions you have with your healthcare provider. Document Revised: 11/08/2018 Document Reviewed: 11/08/2018 Elsevier Patient Education  [place].

## 2020-09-20 NOTE — Progress Notes (Signed)
I,Katawbba Wiggins,acting as a Education administrator for Maximino Greenland, MD.,have documented all relevant documentation on the behalf of Maximino Greenland, MD,as directed by  Maximino Greenland, MD while in the presence of Maximino Greenland, MD.  This visit occurred during the SARS-CoV-2 public health emergency.  Safety protocols were in place, including screening questions prior to the visit, additional usage of staff PPE, and extensive cleaning of exam room while observing appropriate contact time as indicated for disinfecting solutions.  Subjective:     [name] , female    DOB: [birth year] , 75 y.o.   MRN: [id number]   Chief Complaint  Patient presents with   Diabetes   Hypertension    HPI  The patient is here today for a diabetes/HTN f/u.  She reports compliance with meds. She is also scheduled for AWV with [facility] Advisor today.   Diabetes She presents for her follow-up diabetic visit. She has type 2 diabetes mellitus. Pertinent negatives for diabetes include no blurred vision and no chest pain. There are no hypoglycemic complications. Risk factors for coronary artery disease include diabetes mellitus, dyslipidemia, hypertension, obesity, sedentary lifestyle and post-menopausal. She is following a diabetic diet. She participates in exercise intermittently. Her home blood glucose trend is fluctuating minimally. Her breakfast blood glucose is taken between 8-9 am. Her breakfast blood glucose range is generally 110-130 mg/dl. An ACE inhibitor/angiotensin II receptor blocker is being taken. Eye exam is current.  Hypertension This is a chronic problem. The current episode started more than 1 year ago. The problem has been gradually improving since onset. The problem is uncontrolled. Pertinent negatives include no blurred vision or chest pain. Risk factors for coronary artery disease include diabetes mellitus, dyslipidemia, post-menopausal state and sedentary lifestyle. Past treatments include  angiotensin blockers and beta blockers. The current treatment provides moderate improvement.    Past Medical History:  Diagnosis Date   Arthritis    Chronic kidney disease    self reports ckd stage 3    Depression    Diabetes mellitus, type II, insulin dependent ([place])    With neurologic complications. 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 No current outpatient medications on file.  Facility-Administered Medications Ordered in Other Visits:    acetaminophen (TYLENOL) tablet 650 mg, 650 mg, Oral, Q6H PRN, 650 mg at 10/13/20 0511 **OR** acetaminophen (TYLENOL) suppository 650 mg, 650 mg, Rectal, Q6H PRN, Tamala Julian, Rondell A, MD   albuterol (PROVENTIL) (2.5 MG/3ML) 0.083% nebulizer solution 2.5 mg, 2.5 mg, Nebulization, Q6H PRN, Smith, Rondell A, MD   amLODipine (NORVASC) tablet 5 mg, 5 mg, Oral, Daily, Smith, Rondell A, MD, 5 mg at 10/12/20 2200   cefTRIAXone (ROCEPHIN) 2 g in sodium chloride 0.9 % 100 mL IVPB, 2 g, Intravenous, Q24H, Norval Morton, MD, Stopped at 10/13/20 1740   clindamycin (  CLEOCIN) IVPB 900 mg, 900 mg, Intravenous, Q8H, Smith, Rondell A, MD, Stopped at 10/13/20 1639   diclofenac Sodium (VOLTAREN) 1 % topical gel 2 g, 2 g, Topical, QID PRN, Tamala Julian, Rondell A, MD   gabapentin (NEURONTIN) capsule 600 mg, 600 mg, Oral, QHS, Smith, Rondell A, MD, 600 mg at 10/13/20 2205   HYDROcodone-acetaminophen  (NORCO/VICODIN) 5-325 MG per tablet 1 tablet, 1 tablet, Oral, Q6H PRN, Tamala Julian, Rondell A, MD   insulin aspart (novoLOG) injection 0-5 Units, 0-5 Units, Subcutaneous, QHS, Smith, Rondell A, MD   insulin aspart (novoLOG) injection 0-9 Units, 0-9 Units, Subcutaneous, TID WC, Smith, Rondell A, MD, 1 Units at 10/13/20 1714   insulin glargine-yfgn (SEMGLEE) injection 6 Units, 6 Units, Subcutaneous, QHS, Smith, Rondell A, MD, 6 Units at 10/13/20 2206   [START ON 10/15/2020] levothyroxine (SYNTHROID) tablet 12.5 mcg, 12.5 mcg, Oral, Once per day on Mon Tue Wed Thu Fri, Smith, Eustaquio Boyden A, MD   levothyroxine (SYNTHROID) tablet 25 mcg, 25 mcg, Oral, Once per day on Sun Sat, Smith, Rondell A, MD, 25 mcg at 10/13/20 0501   losartan (COZAAR) tablet 100 mg, 100 mg, Oral, Daily, Tamala Julian, Rondell A, MD   metoprolol succinate (TOPROL-XL) 24 hr tablet 25 mg, 25 mg, Oral, QHS, Smith, Rondell A, MD, 25 mg at 10/13/20 2205   ondansetron (ZOFRAN) tablet 4 mg, 4 mg, Oral, Q6H PRN **OR** ondansetron (ZOFRAN) injection 4 mg, 4 mg, Intravenous, Q6H PRN, Smith, Rondell A, MD   vancomycin (VANCOREADY) IVPB 750 mg/150 mL, 750 mg, Intravenous, Q24H, Bertis Ruddy, RPH, Stopped at 10/13/20 1442   Allergies  Allergen Reactions   Meloxicam Other (See Comments)    Fever; muscle aches; "flu-like" symptoms   Other     Rose fever and hay fever      Review of Systems  Constitutional: Negative.   HENT:  Positive for hearing loss.        She c/o decreased hearing b/l ears. Not sure what may be contributing to her sx. No ear pain. No drainage. No sore throat.   Eyes:  Negative for blurred vision.  Respiratory: Negative.    Cardiovascular: Negative.  Negative for chest pain.  Gastrointestinal: Negative.   Psychiatric/Behavioral: Negative.    All other systems reviewed and are negative.   Today's Vitals   09/20/20 1435  BP: 140/60  Pulse: 72  Temp: 97.8 F (36.6 C)  TempSrc: Oral  Weight: 214 lb 9.6 oz (97.3 kg)  Height: 5'  4" (1.626 m)   Body mass index is 36.84 kg/m.  Wt Readings from Last 3 Encounters:  10/12/20 214 lb (97.1 kg)  09/20/20 214 lb 9.6 oz (97.3 kg)  09/20/20 214 lb 9.6 oz (97.3 kg)    BP Readings from Last 3 Encounters:  10/13/20 (!) 119/52  10/03/20 (!) 170/77  09/27/20 (!) 110/43    Objective:  Physical Exam Vitals and nursing note reviewed.  Constitutional:      Appearance: Normal appearance.  HENT:     Head: Normocephalic and atraumatic.     Right Ear: Ear canal and external ear normal. There is impacted cerumen.     Left Ear: Ear canal and external ear normal. There is impacted cerumen.     Nose:     Comments: Masked     Mouth/Throat:     Comments: Masked  Cardiovascular:     Rate and Rhythm: Normal rate and regular rhythm.     Heart sounds: Normal heart sounds.  Pulmonary:  Effort: Pulmonary effort is normal.     Breath sounds: Normal breath sounds.  Abdominal:     General: Bowel sounds are normal.     Palpations: Abdomen is soft.     Comments: Abdominal obesity. Large pannus  Skin:    General: Skin is warm.  Neurological:     General: No focal deficit present.     Mental Status: She is alert.  Psychiatric:        Mood and Affect: Mood normal.        Behavior: Behavior normal.        Assessment And Plan:     1. Diabetes mellitus with stage 2 chronic kidney disease ([place]) Comments: Chronic, she had pre-op labs performed on 7/26. A1c 6.4 - no change in meds.   2. Hypertensive nephropathy Comments: Chronic, fair control. Advised to follow low sodium diet.   3. Decreased hearing of both ears Comments: Ear exam revealed b/l cerumen impaction. She agrees to b/l irrigation.   4. Impacted cerumen, bilateral AFTER OBTAINING VERBAL CONSENT, BOTH EARS WERE FLUSHED BY IRRIGATION. SHE TOLERATED PROCEDURE WELL WITHOUT ANY COMPLICATIONS. NO TM ABNORMALITIES WERE NOTED.  5. Panniculitis Comments: She is scheduled for panniculectomy on August 3rd. Pt advised that I  will pray for her speedy recovery.   6. Class 2 severe obesity due to excess calories with serious comorbidity and body mass index (BMI) of 36.0 to 36.9 in adult [facility])  She is encouraged to strive for BMI less than 30 to decrease cardiac risk. Advised to gradually increase daily activity postoperatively.   Patient was given opportunity to ask questions. Patient verbalized understanding of the plan and was able to repeat key elements of the plan. All questions were answered to their satisfaction.   I, Maximino Greenland, MD, have reviewed all documentation for this visit. The documentation on 10/13/20 for the exam, diagnosis, procedures, and orders are all accurate and complete.   IF YOU HAVE BEEN REFERRED TO A SPECIALIST, IT MAY TAKE 1-2 WEEKS TO SCHEDULE/PROCESS THE REFERRAL. IF YOU HAVE NOT HEARD FROM US/SPECIALIST IN TWO WEEKS, PLEASE GIVE [place] A CALL AT [phone] X [phone].   THE PATIENT IS ENCOURAGED TO PRACTICE SOCIAL DISTANCING DUE TO THE COVID-19 PANDEMIC.

## 2020-09-24 ENCOUNTER — Other Ambulatory Visit: Payer: Self-pay | Admitting: Plastic Surgery

## 2020-09-24 ENCOUNTER — Other Ambulatory Visit (HOSPITAL_COMMUNITY): Payer: Medicare HMO

## 2020-09-24 ENCOUNTER — Telehealth: Payer: Medicare HMO

## 2020-09-24 LAB — SARS CORONAVIRUS 2 (TAT 6-24 HRS): SARS Coronavirus 2: NEGATIVE

## 2020-09-25 NOTE — Anesthesia Preprocedure Evaluation (Addendum)
Anesthesia Evaluation  Patient identified by MRN, date of birth, ID band Patient awake    Reviewed: Allergy & Precautions, NPO status , Patient's Chart, lab work & pertinent test results, reviewed documented beta blocker date and time   History of Anesthesia Complications Negative for: history of anesthetic complications  Airway Mallampati: III  TM Distance: >3 FB Neck ROM: Full    Dental  (+) Edentulous Upper, Missing,    Pulmonary former smoker,    Pulmonary exam normal        Cardiovascular hypertension, Pt. on medications and Pt. on home beta blockers Normal cardiovascular exam     Neuro/Psych Depression negative neurological ROS     GI/Hepatic GERD  Controlled,(+) Hepatitis - (treated), C  Endo/Other  diabetes, Type 2, Insulin DependentHypothyroidism   Renal/GU Renal InsufficiencyRenal disease  negative genitourinary   Musculoskeletal  (+) Arthritis ,   Abdominal   Peds  Hematology  (+) anemia , Hgb 11.1   Anesthesia Other Findings panniculitis  Reproductive/Obstetrics negative OB ROS                            Anesthesia Physical Anesthesia Plan  ASA: 2  Anesthesia Plan: General   Post-op Pain Management:    Induction: Intravenous  PONV Risk Score and Plan: 3 and Treatment may vary due to age or medical condition, Ondansetron and Dexamethasone  Airway Management Planned: Oral ETT  Additional Equipment: None  Intra-op Plan:   Post-operative Plan: Extubation in OR  Informed Consent: I have reviewed the patients History and Physical, chart, labs and discussed the procedure including the risks, benefits and alternatives for the proposed anesthesia with the patient or authorized representative who has indicated his/her understanding and acceptance.     Dental advisory given  Plan Discussed with: CRNA  Anesthesia Plan Comments:        Anesthesia Quick  Evaluation

## 2020-09-26 ENCOUNTER — Encounter (HOSPITAL_COMMUNITY): Payer: Self-pay | Admitting: Plastic Surgery

## 2020-09-26 ENCOUNTER — Encounter (HOSPITAL_COMMUNITY)
Admission: RE | Disposition: A | Discharge status: Home / Self Care | Payer: Self-pay | Source: Home / Self Care | Attending: Plastic Surgery

## 2020-09-26 ENCOUNTER — Ambulatory Visit (HOSPITAL_COMMUNITY): Payer: Medicare HMO | Admitting: Certified Registered Nurse Anesthetist

## 2020-09-26 ENCOUNTER — Ambulatory Visit (HOSPITAL_COMMUNITY): Payer: Medicare HMO

## 2020-09-26 ENCOUNTER — Observation Stay (HOSPITAL_COMMUNITY)
Admission: RE | Admit: 2020-09-26 | Discharge: 2020-09-27 | Disposition: A | Discharge status: Home / Self Care | Payer: Medicare HMO | Attending: Plastic Surgery | Admitting: Plastic Surgery

## 2020-09-26 ENCOUNTER — Other Ambulatory Visit: Payer: Self-pay

## 2020-09-26 DIAGNOSIS — N183 Chronic kidney disease, stage 3 unspecified: Secondary | ICD-10-CM | POA: Diagnosis not present

## 2020-09-26 DIAGNOSIS — Z96653 Presence of artificial knee joint, bilateral: Secondary | ICD-10-CM | POA: Insufficient documentation

## 2020-09-26 DIAGNOSIS — E1122 Type 2 diabetes mellitus with diabetic chronic kidney disease: Secondary | ICD-10-CM | POA: Insufficient documentation

## 2020-09-26 DIAGNOSIS — I129 Hypertensive chronic kidney disease with stage 1 through stage 4 chronic kidney disease, or unspecified chronic kidney disease: Secondary | ICD-10-CM | POA: Insufficient documentation

## 2020-09-26 DIAGNOSIS — Z87891 Personal history of nicotine dependence: Secondary | ICD-10-CM | POA: Diagnosis not present

## 2020-09-26 DIAGNOSIS — M793 Panniculitis, unspecified: Secondary | ICD-10-CM

## 2020-09-26 DIAGNOSIS — G8929 Other chronic pain: Secondary | ICD-10-CM | POA: Insufficient documentation

## 2020-09-26 DIAGNOSIS — E039 Hypothyroidism, unspecified: Secondary | ICD-10-CM | POA: Insufficient documentation

## 2020-09-26 DIAGNOSIS — M545 Low back pain, unspecified: Secondary | ICD-10-CM | POA: Insufficient documentation

## 2020-09-26 DIAGNOSIS — Z419 Encounter for procedure for purposes other than remedying health state, unspecified: Secondary | ICD-10-CM

## 2020-09-26 HISTORY — PX: PANNICULECTOMY: SHX5360

## 2020-09-26 HISTORY — PX: UMBILICAL HERNIA REPAIR: SHX196

## 2020-09-26 LAB — GLUCOSE, CAPILLARY
Glucose-Capillary: 94 mg/dL (ref 70–99)
Glucose-Capillary: 86 mg/dL (ref 70–99)
Glucose-Capillary: 83 mg/dL (ref 70–99)
Glucose-Capillary: 116 mg/dL — ABNORMAL HIGH (ref 70–99)
Glucose-Capillary: 97 mg/dL (ref 70–99)
Glucose-Capillary: 255 mg/dL — ABNORMAL HIGH (ref 70–99)
Glucose-Capillary: 299 mg/dL — ABNORMAL HIGH (ref 70–99)

## 2020-09-26 SURGERY — PANNICULECTOMY
Anesthesia: General | Site: Abdomen

## 2020-09-26 MED ORDER — AMLODIPINE BESYLATE 5 MG PO TABS: 5.0000 mg | ORAL_TABLET | Freq: Every day | ORAL | Status: DC

## 2020-09-26 MED ORDER — CARVEDILOL PHOSPHATE ER 20 MG PO CP24: 20.0000 mg | ORAL_CAPSULE | Freq: Every day | ORAL | Status: DC

## 2020-09-26 MED ORDER — DIPHENHYDRAMINE HCL 12.5 MG/5ML PO ELIX
12.5000 mg | ORAL_SOLUTION | Freq: Four times a day (QID) | ORAL | Status: DC | PRN
  Filled 2020-09-26: qty 5

## 2020-09-26 MED ORDER — SENNA 8.6 MG PO TABS
1.0000 | ORAL_TABLET | Freq: Two times a day (BID) | ORAL | Status: DC
  Filled 2020-09-26: qty 1

## 2020-09-26 MED ORDER — CHLORHEXIDINE GLUCONATE CLOTH 2 % EX PADS: 6.0000 | MEDICATED_PAD | Freq: Once | CUTANEOUS | Status: DC

## 2020-09-26 MED ORDER — NAPROXEN SODIUM 275 MG PO TABS
550.0000 mg | ORAL_TABLET | Freq: Two times a day (BID) | ORAL | Status: DC
  Administered 2020-09-27: 550 mg via ORAL
  Filled 2020-09-26 (×3): qty 2

## 2020-09-26 MED ORDER — DEXAMETHASONE SODIUM PHOSPHATE 10 MG/ML IJ SOLN
INTRAMUSCULAR | Status: AC
  Filled 2020-09-26: qty 1

## 2020-09-26 MED ORDER — ACETAMINOPHEN 325 MG PO TABS
325.0000 mg | ORAL_TABLET | Freq: Four times a day (QID) | ORAL | Status: DC
  Administered 2020-09-26 – 2020-09-27 (×3): 325 mg via ORAL
  Filled 2020-09-26 (×3): qty 1

## 2020-09-26 MED ORDER — ORAL CARE MOUTH RINSE: 15.0000 mL | Freq: Once | OROMUCOSAL | Status: AC

## 2020-09-26 MED ORDER — CHLORHEXIDINE GLUCONATE 0.12 % MT SOLN
15.0000 mL | Freq: Once | OROMUCOSAL | Status: AC
  Administered 2020-09-26: 15 mL via OROMUCOSAL
  Filled 2020-09-26: qty 15

## 2020-09-26 MED ORDER — LOSARTAN POTASSIUM 50 MG PO TABS
100.0000 mg | ORAL_TABLET | Freq: Every day | ORAL | Status: DC
  Administered 2020-09-26: 100 mg via ORAL
  Filled 2020-09-26: qty 2

## 2020-09-26 MED ORDER — OXYCODONE HCL 5 MG PO TABS: 5.0000 mg | ORAL_TABLET | Freq: Once | ORAL | Status: DC | PRN

## 2020-09-26 MED ORDER — SUGAMMADEX SODIUM 200 MG/2ML IV SOLN
INTRAVENOUS | Status: DC | PRN
  Administered 2020-09-26: 200 mg via INTRAVENOUS

## 2020-09-26 MED ORDER — ACETAMINOPHEN 500 MG PO TABS
1000.0000 mg | ORAL_TABLET | Freq: Once | ORAL | Status: AC
  Administered 2020-09-26: 1000 mg via ORAL
  Filled 2020-09-26: qty 2

## 2020-09-26 MED ORDER — INSULIN ASPART 100 UNIT/ML IJ SOLN
0.0000 [IU] | Freq: Every day | INTRAMUSCULAR | Status: DC
  Administered 2020-09-26: 3 [IU] via SUBCUTANEOUS

## 2020-09-26 MED ORDER — DEXAMETHASONE SODIUM PHOSPHATE 10 MG/ML IJ SOLN
INTRAMUSCULAR | Status: DC | PRN
  Administered 2020-09-26: 5 mg via INTRAVENOUS

## 2020-09-26 MED ORDER — LIDOCAINE 2% (20 MG/ML) 5 ML SYRINGE
INTRAMUSCULAR | Status: AC
  Filled 2020-09-26: qty 5

## 2020-09-26 MED ORDER — PHENYLEPHRINE HCL-NACL 20-0.9 MG/250ML-% IV SOLN
INTRAVENOUS | Status: DC | PRN
  Administered 2020-09-26: 20 ug/min via INTRAVENOUS

## 2020-09-26 MED ORDER — LIDOCAINE-EPINEPHRINE 1 %-1:100000 IJ SOLN
INTRAMUSCULAR | Status: DC | PRN
  Administered 2020-09-26: 10 mL

## 2020-09-26 MED ORDER — 0.9 % SODIUM CHLORIDE (POUR BTL) OPTIME
TOPICAL | Status: DC | PRN
  Administered 2020-09-26 (×2): 1000 mL

## 2020-09-26 MED ORDER — SODIUM BICARBONATE 4.2 % IV SOLN
INTRAVENOUS | Status: DC
  Filled 2020-09-26: qty 50

## 2020-09-26 MED ORDER — FENTANYL CITRATE (PF) 250 MCG/5ML IJ SOLN
INTRAMUSCULAR | Status: AC
  Filled 2020-09-26: qty 5

## 2020-09-26 MED ORDER — VISTASEAL 10 ML SINGLE DOSE KIT
PACK | CUTANEOUS | Status: DC | PRN
  Administered 2020-09-26: 10 mL via TOPICAL

## 2020-09-26 MED ORDER — FENTANYL CITRATE (PF) 100 MCG/2ML IJ SOLN: 25.0000 ug | INTRAMUSCULAR | Status: DC | PRN

## 2020-09-26 MED ORDER — BUPIVACAINE HCL (PF) 0.25 % IJ SOLN
INTRAMUSCULAR | Status: AC
  Filled 2020-09-26: qty 30

## 2020-09-26 MED ORDER — BUPIVACAINE HCL 0.25 % IJ SOLN
INTRAMUSCULAR | Status: DC | PRN
  Administered 2020-09-26: 10 mL

## 2020-09-26 MED ORDER — CEFAZOLIN SODIUM-DEXTROSE 2-4 GM/100ML-% IV SOLN
2.0000 g | INTRAVENOUS | Status: AC
  Administered 2020-09-26: 2 g via INTRAVENOUS
  Filled 2020-09-26: qty 100

## 2020-09-26 MED ORDER — TRAMADOL HCL 50 MG PO TABS
50.0000 mg | ORAL_TABLET | Freq: Four times a day (QID) | ORAL | Status: DC | PRN
  Administered 2020-09-26 – 2020-09-27 (×2): 50 mg via ORAL
  Filled 2020-09-26 (×2): qty 1

## 2020-09-26 MED ORDER — DIPHENHYDRAMINE HCL 50 MG/ML IJ SOLN: 12.5000 mg | Freq: Four times a day (QID) | INTRAMUSCULAR | Status: DC | PRN

## 2020-09-26 MED ORDER — ONDANSETRON HCL 4 MG/2ML IJ SOLN
INTRAMUSCULAR | Status: AC
  Filled 2020-09-26: qty 2

## 2020-09-26 MED ORDER — ROCURONIUM BROMIDE 10 MG/ML (PF) SYRINGE
PREFILLED_SYRINGE | INTRAVENOUS | Status: DC | PRN
  Administered 2020-09-26: 20 mg via INTRAVENOUS
  Administered 2020-09-26: 60 mg via INTRAVENOUS
  Administered 2020-09-26: 20 mg via INTRAVENOUS

## 2020-09-26 MED ORDER — PANTOPRAZOLE SODIUM 40 MG IV SOLR
40.0000 mg | Freq: Every day | INTRAVENOUS | Status: DC
  Administered 2020-09-26: 40 mg via INTRAVENOUS
  Filled 2020-09-26: qty 40

## 2020-09-26 MED ORDER — BUPIVACAINE-EPINEPHRINE (PF) 0.25% -1:200000 IJ SOLN
INTRAMUSCULAR | Status: AC
  Filled 2020-09-26: qty 30

## 2020-09-26 MED ORDER — LACTATED RINGERS IV SOLN: INTRAVENOUS | Status: DC

## 2020-09-26 MED ORDER — LIDOCAINE 2% (20 MG/ML) 5 ML SYRINGE
INTRAMUSCULAR | Status: DC | PRN
  Administered 2020-09-26: 100 mg via INTRAVENOUS

## 2020-09-26 MED ORDER — PROMETHAZINE HCL 25 MG/ML IJ SOLN: 6.2500 mg | INTRAMUSCULAR | Status: DC | PRN

## 2020-09-26 MED ORDER — OXYCODONE HCL 5 MG/5ML PO SOLN
5.0000 mg | Freq: Once | ORAL | Status: DC | PRN
Start: 2020-09-26 — End: 2020-09-26

## 2020-09-26 MED ORDER — POLYETHYLENE GLYCOL 3350 17 G PO PACK: 17.0000 g | PACK | Freq: Every day | ORAL | Status: DC | PRN

## 2020-09-26 MED ORDER — HYDROCODONE-ACETAMINOPHEN 5-325 MG PO TABS
1.0000 | ORAL_TABLET | Freq: Four times a day (QID) | ORAL | Status: DC | PRN
  Administered 2020-09-26: 1 via ORAL
  Filled 2020-09-26: qty 1

## 2020-09-26 MED ORDER — LIDOCAINE-EPINEPHRINE 1 %-1:100000 IJ SOLN
INTRAMUSCULAR | Status: AC
  Filled 2020-09-26: qty 1

## 2020-09-26 MED ORDER — SIMVASTATIN 20 MG PO TABS
10.0000 mg | ORAL_TABLET | Freq: Every day | ORAL | Status: DC
  Administered 2020-09-26: 10 mg via ORAL
  Filled 2020-09-26: qty 1

## 2020-09-26 MED ORDER — INSULIN ASPART 100 UNIT/ML IJ SOLN
0.0000 [IU] | Freq: Three times a day (TID) | INTRAMUSCULAR | Status: DC
  Administered 2020-09-27 (×2): 8 [IU] via SUBCUTANEOUS

## 2020-09-26 MED ORDER — ONDANSETRON 4 MG PO TBDP: 4.0000 mg | ORAL_TABLET | Freq: Four times a day (QID) | ORAL | Status: DC | PRN

## 2020-09-26 MED ORDER — ONDANSETRON HCL 4 MG/2ML IJ SOLN
INTRAMUSCULAR | Status: DC | PRN
  Administered 2020-09-26: 4 mg via INTRAVENOUS

## 2020-09-26 MED ORDER — ROCURONIUM BROMIDE 10 MG/ML (PF) SYRINGE
PREFILLED_SYRINGE | INTRAVENOUS | Status: AC
  Filled 2020-09-26: qty 10

## 2020-09-26 MED ORDER — GABAPENTIN 300 MG PO CAPS
600.0000 mg | ORAL_CAPSULE | Freq: Every day | ORAL | Status: DC
  Administered 2020-09-26: 600 mg via ORAL
  Filled 2020-09-26: qty 2

## 2020-09-26 MED ORDER — ONDANSETRON HCL 4 MG/2ML IJ SOLN: 4.0000 mg | Freq: Four times a day (QID) | INTRAMUSCULAR | Status: DC | PRN

## 2020-09-26 MED ORDER — KCL IN DEXTROSE-NACL 20-5-0.45 MEQ/L-%-% IV SOLN
INTRAVENOUS | Status: DC
  Filled 2020-09-26 (×2): qty 1000

## 2020-09-26 MED ORDER — HYDROMORPHONE HCL 1 MG/ML IJ SOLN: 1.0000 mg | INTRAMUSCULAR | Status: DC | PRN

## 2020-09-26 MED ORDER — PROPOFOL 10 MG/ML IV BOLUS
INTRAVENOUS | Status: DC | PRN
  Administered 2020-09-26: 200 mg via INTRAVENOUS

## 2020-09-26 MED ORDER — CEFAZOLIN SODIUM-DEXTROSE 2-4 GM/100ML-% IV SOLN
2.0000 g | Freq: Three times a day (TID) | INTRAVENOUS | Status: DC
  Administered 2020-09-26 – 2020-09-27 (×3): 2 g via INTRAVENOUS
  Filled 2020-09-26 (×3): qty 100

## 2020-09-26 MED ORDER — FENTANYL CITRATE (PF) 250 MCG/5ML IJ SOLN
INTRAMUSCULAR | Status: DC | PRN
  Administered 2020-09-26: 25 ug via INTRAVENOUS
  Administered 2020-09-26: 100 ug via INTRAVENOUS
  Administered 2020-09-26 (×2): 50 ug via INTRAVENOUS
  Administered 2020-09-26: 25 ug via INTRAVENOUS

## 2020-09-26 SURGICAL SUPPLY — 56 items
APPLIER CLIP 9.375 MED OPEN (MISCELLANEOUS) ×4
BAG COUNTER SPONGE SURGICOUNT (BAG) ×2 IMPLANT
BINDER ABDOMINAL 10 UNV 27-48 (MISCELLANEOUS) IMPLANT
BINDER ABDOMINAL 12 ML 46-62 (SOFTGOODS) ×2 IMPLANT
BINDER ABDOMINAL 12 SM 30-45 (SOFTGOODS) IMPLANT
BIOPATCH RED 1 DISK 7.0 (GAUZE/BANDAGES/DRESSINGS) ×4 IMPLANT
CANISTER WOUND CARE 500ML ATS (WOUND CARE) ×2 IMPLANT
CLIP APPLIE 9.375 MED OPEN (MISCELLANEOUS) ×2 IMPLANT
DERMABOND ADVANCED (GAUZE/BANDAGES/DRESSINGS) ×4
DERMABOND ADVANCED .7 DNX12 (GAUZE/BANDAGES/DRESSINGS) ×4 IMPLANT
DRAIN CHANNEL 19F RND (DRAIN) ×2 IMPLANT
DRAPE HALF SHEET 40X57 (DRAPES) IMPLANT
DRAPE INCISE IOBAN 66X45 STRL (DRAPES) ×2 IMPLANT
DRSG PAD ABDOMINAL 8X10 ST (GAUZE/BANDAGES/DRESSINGS) ×2 IMPLANT
DRSG TEGADERM 4X4.75 (GAUZE/BANDAGES/DRESSINGS) ×4 IMPLANT
ELECT BLADE 4.0 EZ CLEAN MEGAD (MISCELLANEOUS) ×2
ELECT REM PT RETURN 9FT ADLT (ELECTROSURGICAL) ×2
ELECTRODE BLDE 4.0 EZ CLN MEGD (MISCELLANEOUS) ×1 IMPLANT
ELECTRODE REM PT RTRN 9FT ADLT (ELECTROSURGICAL) ×1 IMPLANT
EVACUATOR SILICONE 100CC (DRAIN) ×6 IMPLANT
GAUZE SPONGE 4X4 12PLY STRL (GAUZE/BANDAGES/DRESSINGS) ×2 IMPLANT
GAUZE XEROFORM 5X9 LF (GAUZE/BANDAGES/DRESSINGS) ×2 IMPLANT
GLOVE SURG ENC MOIS LTX SZ6.5 (GLOVE) ×6 IMPLANT
GLOVE SURG ENC MOIS LTX SZ7.5 (GLOVE) ×4 IMPLANT
GOWN STRL REUS W/ TWL LRG LVL3 (GOWN DISPOSABLE) ×2 IMPLANT
GOWN STRL REUS W/TWL LRG LVL3 (GOWN DISPOSABLE) ×2
KIT BASIN OR (CUSTOM PROCEDURE TRAY) ×2 IMPLANT
NEEDLE HYPO 25X1 1.5 SAFETY (NEEDLE) ×2 IMPLANT
NS IRRIG 1000ML POUR BTL (IV SOLUTION) ×2 IMPLANT
PACK GENERAL/GYN (CUSTOM PROCEDURE TRAY) ×2 IMPLANT
PACK UNIVERSAL I (CUSTOM PROCEDURE TRAY) ×2 IMPLANT
PENCIL SMOKE EVACUATOR (MISCELLANEOUS) ×2 IMPLANT
PIN SAFETY STERILE (MISCELLANEOUS) IMPLANT
PREVENA INCISION MGT 90 150 (MISCELLANEOUS) ×2 IMPLANT
SLEEVE SCD COMPRESS KNEE MED (STOCKING) ×2 IMPLANT
SPONGE T-LAP 18X18 ~~LOC~~+RFID (SPONGE) ×4 IMPLANT
STAPLER VISISTAT 35W (STAPLE) ×2 IMPLANT
STRIP CLOSURE SKIN 1/2X4 (GAUZE/BANDAGES/DRESSINGS) ×4 IMPLANT
STRIP CLOSURE SKIN 1/4X4 (GAUZE/BANDAGES/DRESSINGS) ×2 IMPLANT
SUT ETHIBOND 0 MO6 C/R (SUTURE) ×2 IMPLANT
SUT MNCRL AB 3-0 PS2 18 (SUTURE) ×4 IMPLANT
SUT MNCRL AB 4-0 PS2 18 (SUTURE) ×8 IMPLANT
SUT MON AB 3-0 SH 27 (SUTURE) ×5
SUT MON AB 3-0 SH27 (SUTURE) ×5 IMPLANT
SUT MON AB 5-0 PS2 18 (SUTURE) ×4 IMPLANT
SUT PDS AB 2-0 CT1 27 (SUTURE) ×8 IMPLANT
SUT PDS AB 3-0 SH 27 (SUTURE) ×12 IMPLANT
SUT PROLENE 2 0 CT2 30 (SUTURE) ×4 IMPLANT
SUT SILK 3 0 SH 30 (SUTURE) ×4 IMPLANT
SUT VIC AB 3-0 SH 27 (SUTURE)
SUT VIC AB 3-0 SH 27X BRD (SUTURE) IMPLANT
SUT VIC AB 4-0 PS2 18 (SUTURE) ×16 IMPLANT
SUT VICRYL 4-0 PS2 18IN ABS (SUTURE) IMPLANT
SYR CONTROL 10ML LL (SYRINGE) ×2 IMPLANT
TOWEL GREEN STERILE FF (TOWEL DISPOSABLE) ×4 IMPLANT
UNDERPAD 30X36 HEAVY ABSORB (UNDERPADS AND DIAPERS) IMPLANT

## 2020-09-26 NOTE — Addendum Note (Signed)
Addendum  created 09/26/20 1407 by Janene Harvey, CRNA   Charge Capture section accepted

## 2020-09-26 NOTE — Anesthesia Postprocedure Evaluation (Signed)
Anesthesia Post Note  Patient: Teresa Golden  Procedure(s) Performed: PANNICULECTOMY (Abdomen) HERNIA REPAIR UMBILICAL ADULT (Abdomen)     Patient location during evaluation: PACU Anesthesia Type: General Level of consciousness: awake and alert and oriented Pain management: pain level controlled Vital Signs Assessment: post-procedure vital signs reviewed and stable Respiratory status: spontaneous breathing, nonlabored ventilation and respiratory function stable Cardiovascular status: blood pressure returned to baseline Postop Assessment: no apparent nausea or vomiting Anesthetic complications: no   No notable events documented.  Last Vitals:  Vitals:   09/26/20 1320 09/26/20 1350  BP: (!) 159/82 (!) 144/63  Pulse: 63 65  Resp: 17 16  Temp:  (!) 36.2 C  SpO2: 98% 97%    Last Pain:  Vitals:   09/26/20 1350  TempSrc:   PainSc: [place]

## 2020-09-26 NOTE — Transfer of Care (Signed)
Immediate Anesthesia Transfer of Care Note  Patient: Teresa Golden  Procedure(s) Performed: PANNICULECTOMY (Abdomen) HERNIA REPAIR UMBILICAL ADULT (Abdomen)  Patient Location: PACU  Anesthesia Type:General  Level of Consciousness: drowsy and patient cooperative  Airway & Oxygen Therapy: Patient Spontanous Breathing  Post-op Assessment: Report given to RN and Post -op Vital signs reviewed and stable  Post vital signs: Reviewed and stable  Last Vitals:  Vitals Value Taken Time  BP 195/86 09/26/20 1218  Temp    Pulse 76 09/26/20 1220  Resp 20 09/26/20 1220  SpO2 96 % 09/26/20 1220  Vitals shown include unvalidated device data.  Last Pain:  Vitals:   09/26/20 0716  TempSrc:   PainSc: 3          Complications: No notable events documented.

## 2020-09-26 NOTE — Interval H&P Note (Signed)
History and Physical Interval Note:  09/26/2020 7:41 AM  Teresa Golden  has presented today for surgery, with the diagnosis of panniculitis.  The various methods of treatment have been discussed with the patient and family. After consideration of risks, benefits and other options for treatment, the patient has consented to  Procedure(s): PANNICULECTOMY (N/A) as a surgical intervention.  The patient's history has been reviewed, patient examined, no change in status, stable for surgery.  I have reviewed the patient's chart and labs.  Questions were answered to the patient's satisfaction.     Loel Lofty [name]

## 2020-09-26 NOTE — Op Note (Signed)
Operative Report  Date of operation: 09/26/2020  Patient: Teresa Golden, MRN: [id number], 75 y.o. female.   Date of birth: 24-Nov-1945  Location: [facility]  Preoperative Diagnosis:  Abdominal panniculitis  Postoperative Diagnosis:  Same  Procedure:  Panniculectomy  Surgeon:  Lyndee Leo Matteus Mcnelly  Assistant:  Roetta Sessions, PA  Anesthesia:  General  EBL:  150cc  Drains:  2 number 43 blake round drains  Condition:  Stable  Complications: None  Disposition: Recovery Room  Procedure in Detail: The patient was seen the morning of her surgery.  The patient was marked and the surgery plan was reviewed.  An IV was placed and antibiotics were administered.  The patient was taken to the operating room and underwent general anesthesia.  A time out was called and all information was confirmed to be correct. SCD's were placed on the legs.  A pillow was placed under the knees. The patient was prepped and draped in the standard sterile fashion. Local was placed along the intended incision line. The marked lower incision was incised.  Dissection was taken to scarpa's fascia and the rectus abdominus fascia. The skin and subcutaneous tissue was then lifted off the fascia to the level of the umbilicus. The umbilicus was then circumscribed and freed from the surrounding skin.  Care was given to not devascularize the stalk.  A hernia was identified along the superior left side of the umbilical area.  General surgery was called and the hernia was repaired.  Their procedure is documented in the chart.  The pannus was excised and weighed (+4000 grams).  The mons was also suspended with 3-0 Monocryl.  The tissue was irrigated with normal saline solution. Two #19 blake round drains were placed and secured to the skin with 3-0 Silk.  Evicel was placed on the abdominal wall.  The new position of the umbilicus was determined and a small v shaped incision placed in the abdominal wall. The umbilicus  was secured with 4.0 Vicryl at 12, 3, 6, and 9 o'clock. It was inset from the dermis to the abdominal wall and then closed with 5-0 Monocryl. The abdominal wall was closed with buried 2-0 PDS and 3-0 Monocryl followed by subcuticular 4-0 Monocryl.    Dermabond was applied. ABD's and an abdominal binder were placed. A prevena vac was applied across the incision.  Patient was allowed to wake up, extubated and taken on a stretcher in the flexed position to the recovery room. Family was notified at the end of the case.   The advanced practice practitioner (APP) assisted throughout the case.  The APP was essential in retraction and counter traction when needed to make the case progress smoothly.  This retraction and assistance made it possible to see the tissue plans for the procedure.  The assistance was needed for blood control, tissue re-approximation and assisted with closure of the incision site.

## 2020-09-26 NOTE — Op Note (Signed)
  09/26/2020  9:51 AM  PATIENT:  Teresa Golden  75 y.o. female  PRE-OPERATIVE DIAGNOSIS:  supraumbilical hernia  POST-OPERATIVE DIAGNOSIS:  supraumbilical hernia  PROCEDURE:  Procedure(s):  HERNIA REPAIR UMBILICAL ADULT  SURGEON:  Georganna Skeans, MD  ASSISTANTS: Audelia Hives, DO   ANESTHESIA:   general  EBL:  Total I/O In: 100 [IV Piggyback:100] Out: -   BLOOD ADMINISTERED:none  DRAINS: none   SPECIMEN:  No Specimen  DISPOSITION OF SPECIMEN:  N/A  COUNTS:  YES  DICTATION: .Dragon Dictation Intraoperative consultation: Patient is in the operating room with Dr. Marla Roe undergoing a panniculectomy.  Dr. Marla Roe found a supraumbilical hernia during her dissection.  She asked me to scrub in and repair it.  On exam, there was a small supraumbilical fascial defect containing omentum only.  I excised the omentum using cautery and achieving good hemostasis.  The hernia defect was only 1 cm.  I repaired it primarily with multiple interrupted 2-0 Prolene sutures.  It came together nicely.  There is no significant bleeding.  She remained in the operating room with Dr. Marla Roe.  No complications. PATIENT DISPOSITION:   remains in the OR   Delay start of Pharmacological VTE agent (>24hrs) due to surgical blood loss or risk of bleeding:  no  Georganna Skeans, MD, MPH, FACS Pager: [phone]  8/3/20229:51 AM

## 2020-09-26 NOTE — Discharge Instructions (Signed)
INSTRUCTIONS FOR AFTER SURGERY   You will likely have some questions about what to expect following your operation.  The following information will help you and your family understand what to expect when you are discharged from the hospital.  Following these guidelines will help ensure a smooth recovery and reduce risks of complications.  Postoperative instructions include information on: diet, wound care, medications and physical activity.  AFTER SURGERY Expect to go home after the procedure.  In some cases, you may need to spend one night in the hospital for observation.  DIET This surgery does not require a specific diet.  However, I have to mention that the healthier you eat the better your body can start healing. It is important to increasing your protein intake.  This means limiting the foods with added sugar.  Focus on fruits and vegetables and some meat. It is very important to drink water after your surgery.  If your urine is bright yellow, then it is concentrated, and you need to drink more water.  As a general rule after surgery, you should have 8 ounces of water every hour while awake.  If you find you are persistently nauseated or unable to take in liquids let us know.  NO TOBACCO USE or EXPOSURE.  This will slow your healing process and increase the risk of a wound.  WOUND CARE If you don't have a drain: You can shower the day after surgery.  Use fragrance free soap.  Dial, [place], [place] and Cetaphil are usually mild on the skin.  If you have a drain: Clean with baby wipes for 3-5 days and then you can shower.  If you have a binder you may remove it to shower and then put it back on. If you have steri-strips / tape directly attached to your skin leave them in place. It is OK to get these wet.  No baths, pools or hot tubs for two weeks. We close your incision to leave the smallest and best-looking scar. No ointment or creams on your incisions until given the go ahead.  Especially not  Neosporin (Too many skin reactions with this one).  A few weeks after surgery you can use Mederma and start massaging the scar. We ask you to wear your binder or sports bra for the first 6 weeks around the clock, including while sleeping. This provides added comfort and helps reduce the fluid accumulation at the surgery site.  ACTIVITY No heavy lifting until cleared by the doctor.  It is OK to walk and climb stairs. In fact, moving your legs is very important to decrease your risk of a blood clot.  It will also help keep you from getting deconditioned.  Every 1 to 2 hours get up and walk for 5 minutes. This will help with a quicker recovery back to normal.  Let pain be your guide so you don't do too much.  NO, you cannot do the spring cleaning and don't plan on taking care of anyone else.  This is your time for TLC.   WORK Everyone returns to work at different times. As a rough guide, most people take at least 1 - 2 weeks off prior to returning to work. If you need documentation for your job, bring the forms to your postoperative follow up visit.  DRIVING Arrange for someone to bring you home from the hospital.  You may be able to drive a few days after surgery but not while taking any narcotics or valium.  BOWEL  MOVEMENTS Constipation can occur after anesthesia and while taking pain medication.  It is important to stay ahead for your comfort.  We recommend taking Milk of Magnesia (2 tablespoons; twice a day) while taking the pain pills.  SEROMA This is fluid your body tried to put in the surgical site.  This is normal but if it creates excessive pain and swelling let us know.  It usually decreases in a few weeks.  MEDICATIONS and PAIN CONTROL At your preoperative visit for you history and physical you were given the following medications: An antibiotic: Start this medication when you get home and take according to the instructions on the bottle. Zofran 4 mg:  This is to treat nausea and  vomiting.  You can take this every 6 hours as needed and only if needed. Norco (hydrocodone/acetaminophen) 5/325 mg:  This is only to be used after you have taken the motrin or the tylenol. Every 8 hours as needed. Over the counter Medication to take: Ibuprofen (Motrin) 600 mg:  Take this every 6 hours.  If you have additional pain then take 500 mg of the tylenol.  Only take the Norco after you have tried these two. Miralax or stool softener of choice: Take this according to the bottle if you take the [place] Call your surgeon's office if any of the following occur:  Fever 101 degrees F or greater  Excessive bleeding or fluid from the incision site.  Pain that increases over time without aid from the medications  Redness, warmth, or pus draining from incision sites  Persistent nausea or inability to take in liquids  Severe misshapen area that underwent the operation.   [facility] Pl[facility] Surgery Specialist  What is the benefit of having a drain?  During surgery your tissue layers are separated.  This raw surface stimulates your body to fill the space with serous fluid.  This is normal but you don't want that fluid to collect and prevent healing.  A fluid collection can also become infected.  The Jackson-Pratt (JP) drain is used to eliminate this collection of fluid and allow the tissue to heal together.    Jackson-Pratt (JP) bulb    How to care for your drainage and suction unit at home Your drainage catheter will be connected to a collection device. The vacuum caused when the device is compressed allows drainage to collect in the device.    Wash your hands with soap and water before and after touching the system. Empty the JP drain every 12 hours once you get home from your procedure. Record the fluid amount on the record sheet included. Start with stripping the drain tube to push the clots or excess fluid to the bulb.  Do this by pinching the tube with one hand near your skin.   Then with the other hand squeeze the tubing and work it toward the bulb.  This should be done several times a day.  This may collapse the tube which will correct on its own.   Use a safety pin to attach your collection device to your clothing so there is no tension on the insertion site.   If you have drainage at the skin insertion site, you can apply a gauze dressing and secure it with tape. If the drain falls out, apply a gauze dressing over the drain insertion site and secure with tape.   To empty the collection device:   Release the stopper on the top of the collection unit (  bulb).  Pour contents into a measuring container such as a plastic medicine cup.  Record the day and amount of drainage on the attached sheet. This should be done at least twice a day.    To compress the Jackson-Pratt Bulb:  Release the stopper at the top of the bulb. Squeeze the bulb tightly in your fist, squeezing air out of the bulb.  Replace the stopper while the bulb is compressed.  Be careful not to spill the contents when squeezing the bulb. The drainage will start bright red and turn to pink and then yellow with time. IMPORTANT: If the bulb is not squeezed before adding the stopper it will not draw out the fluid.  Care for the JP drain site and your skin daily:  You may shower three days after surgery. Secure the drain to a ribbon or cloth around your waist while showering so it does not pull out while showering. Be sure your hands are cleaned with soap and water. Use a clean wet cotton swab to clean the skin around the drain site.  Use another cotton swab to place Vaseline or antibiotic ointment on the skin around the drain.     Contact your physician if any of the following occur:  The fluid in the bulb becomes cloudy. Your temperature is greater than 101.4.  The incision opens. If you have drainage at the skin insertion site, you can apply a gauze dressing and secure it with tape. If the drain falls  out, apply a gauze dressing over the drain insertion site and secure with tape.  You will usually have more drainage when you are active than while you rest or are asleep. If the drainage increases significantly or is bloody call the physician                             Bring this record with you to each office visit Date  Drainage Volume  Date   Drainage volume

## 2020-09-26 NOTE — Anesthesia Procedure Notes (Signed)
Procedure Name: Intubation Date/Time: 09/26/2020 8:43 AM Performed by: Janene Harvey, CRNA Pre-anesthesia Checklist: Patient identified, Emergency Drugs available, Suction available and Patient being monitored Patient Re-evaluated:Patient Re-evaluated prior to induction Oxygen Delivery Method: Circle system utilized Preoxygenation: Pre-oxygenation with 100% oxygen Induction Type: IV induction Ventilation: Mask ventilation without difficulty and Oral airway inserted - appropriate to patient size Laryngoscope Size: Mac and 3 Grade View: Grade II Tube type: Oral Tube size: 7.0 mm Number of attempts: 1 Airway Equipment and Method: Stylet and Oral airway Placement Confirmation: ETT inserted through vocal cords under direct vision, positive ETCO2 and breath sounds checked- equal and bilateral Secured at: 22 cm Tube secured with: Tape Dental Injury: Teeth and Oropharynx as per pre-operative assessment  Comments: Intubation by [facility]

## 2020-09-27 ENCOUNTER — Encounter (HOSPITAL_COMMUNITY): Payer: Self-pay | Admitting: Plastic Surgery

## 2020-09-27 ENCOUNTER — Ambulatory Visit: Payer: Medicare HMO | Admitting: Addiction (Substance Use Disorder)

## 2020-09-27 ENCOUNTER — Other Ambulatory Visit: Payer: Self-pay | Admitting: Surgical

## 2020-09-27 DIAGNOSIS — M793 Panniculitis, unspecified: Secondary | ICD-10-CM | POA: Diagnosis not present

## 2020-09-27 LAB — GLUCOSE, CAPILLARY: Glucose-Capillary: 256 mg/dL — ABNORMAL HIGH (ref 70–99)

## 2020-09-27 LAB — SURGICAL PATHOLOGY

## 2020-09-27 MED ORDER — TRAMADOL HCL 50 MG PO TABS: 50.0000 mg | ORAL_TABLET | Freq: Three times a day (TID) | ORAL | 0 refills | Status: AC | PRN

## 2020-09-27 NOTE — Progress Notes (Signed)
1 Day Post-Op   Subjective/Chief Complaint: Doing fine, tol diet, out of bed   Objective: Vital signs in last 24 hours: Temp:  [97.2 F (36.2 C)-98.2 F (36.8 C)] 98.2 F (36.8 C) (08/04 0437) Pulse Rate:  [63-78] 73 (08/04 0437) Resp:  [13-18] 18 (08/04 0437) BP: (97-195)/(50-86) 101/51 (08/04 0437) SpO2:  [97 %-99 %] 97 % (08/04 0437) Last BM Date: 09/26/20  Intake/Output from previous day: 08/03 0701 - 08/04 0700 In: 1500 [I.V.:1100; IV Piggyback:200] Out: 385 [Drains:285; Blood:100] Intake/Output this shift: No intake/output data recorded.  GI: soft, approp tender  Lab Results:  No results for input(s): WBC, HGB, HCT, PLT in the last 72 hours. BMET No results for input(s): NA, K, CL, CO2, GLUCOSE, BUN, CREATININE, CALCIUM in the last 72 hours. PT/INR No results for input(s): LABPROT, INR in the last 72 hours. ABG No results for input(s): PHART, HCO3 in the last 72 hours.  Invalid input(s): PCO2, PO2  Studies/Results: DG OR LOCAL ABDOMEN  Result Date: 09/26/2020 CLINICAL DATA:  Evaluate for retained foreign body. EXAM: OR LOCAL ABDOMEN COMPARISON:  None. FINDINGS: 1148 hours. Surgical drain overlies the upper abdomen. Surgical clips overlie the abdomen bilaterally. No evidence for unexpected retained radiopaque soft tissue foreign body. Degenerative changes noted in the lumbar spine. Bowel gas pattern is nonspecific. IMPRESSION: Surgical drain overlies the upper abdomen. No evidence for unexpected radiopaque soft tissue foreign body. Findings were called by me to [facility], in the operating room, at 1202 hours on 09/26/2020. Electronically Signed   By: Misty Stanley M.D.   On: 09/26/2020 12:01    Anti-infectives: Anti-infectives (From admission, onward)    Start     Dose/Rate Route Frequency Ordered Stop   09/26/20 1700  ceFAZolin (ANCEF) IVPB 2g/100 mL premix        2 g 200 mL/hr over 30 Minutes Intravenous Every 8 hours 09/26/20 1433     09/26/20 0715  ceFAZolin  (ANCEF) IVPB 2g/100 mL premix        2 g 200 mL/hr over 30 Minutes Intravenous On call to O.R. 09/26/20 0701 09/26/20 0845       Assessment/Plan: POD 1 supraumbilical hernia repair with suture- [place] -dc home per plastics -restrictions for panniculectomy should be plenty for hernia repair -I discussed repair and possible recurrence with her today -no follow up needed   Rolm Bookbinder 09/27/2020

## 2020-09-27 NOTE — Progress Notes (Signed)
Pt doing well. Pt and sister given D/C instructions with verbal understanding. Rx's were sent to the pharmacy by MD. Pt and sister were taught how to care and record output for the JP drains, verbal understanding was provided. Pt and sister aldo educated about wound vac. Pt D/C'd home with 2 JP drains and a wound vac per MD order. Pt is stable @ D/C and will follow up with MD in 1 week. Holli Humbles, RN

## 2020-09-28 NOTE — Discharge Summary (Signed)
Physician Discharge Summary  Patient ID: BOWIE LUSH MRN: [id number] DOB/AGE: [birth year] 75 y.o.  Admit date: 09/26/2020 Discharge date: 09/28/2020  Admission Diagnoses: Panniculitis  Discharge Diagnoses:  Active Problems:   Panniculitis   Discharged Condition: good  Hospital Course: Patient is a 75 year old female who presents to the [facility] operating room for panniculectomy with Dr. Marla Roe on 09/26/2020.  Patient remained overnight for observation and pain management.  This a.m. on evaluation patient is doing well, sitting up in bed, reports minimal pain to her abdomen.  She does report positive BM.  She denies fevers, chills, nausea, vomiting.  She is not having any chest pain or shortness of breath.  She reports that she is planning to have assistance at home from her family who will plan with her for a few days.  Patient is stable for discharge.  No acute overnight events.  Consults: None  Significant Diagnostic Studies: None  Treatments: IV hydration, antibiotics: Ancef, analgesia: acetaminophen, Vicodin, and tramadol, and insulin  Discharge Exam: Blood pressure (!) 110/43, pulse 63, temperature 97.6 F (36.4 C), temperature source Oral, resp. rate 18, SpO2 99 %. General appearance: alert, cooperative, no distress, and resting in bed, drowsy Head: Normocephalic, without obvious abnormality, atraumatic Resp: Unlabored, symmetric rise and fall GI: soft, non-tender; bowel sounds normal; no masses,  no organomegaly and incisional wound VAC in place with good seal noted.  No drainage noted in canister.  Abdomen is mildly tender to palpation, mild postoperative swelling noted as expected.  No significant bruising noted.  No abdominal distention noted.  Umbilicus is viable.  Bilateral JP drains in place with serosanguineous output and drains. Extremities: extremities normal, atraumatic, no cyanosis or edema Pulses: 2+ and symmetric  Disposition: Discharge disposition: 01-Home or  Self Care       Discharge Instructions     Call MD for:  difficulty breathing, headache or visual disturbances   Complete by: As directed    Call MD for:  extreme fatigue   Complete by: As directed    Call MD for:  hives   Complete by: As directed    Call MD for:  persistant dizziness or light-headedness   Complete by: As directed    Call MD for:  persistant nausea and vomiting   Complete by: As directed    Call MD for:  redness, tenderness, or signs of infection (pain, swelling, redness, odor or green/yellow discharge around incision site)   Complete by: As directed    Call MD for:  severe uncontrolled pain   Complete by: As directed    Call MD for:  temperature >100.4   Complete by: As directed    Diet - low sodium heart healthy   Complete by: As directed    Increase activity slowly   Complete by: As directed       Allergies as of 09/27/2020       Reactions   Meloxicam Other (See Comments)   Fever; muscle aches; "flu-like" symptoms   Other    Rose fever and hay fever         Medication List     TAKE these medications    amLODipine 5 MG tablet Commonly known as: NORVASC Take 1 tablet by mouth once daily What changed: when to take this   B-complex with vitamin C tablet Take 1 tablet by mouth daily.   Biotin 10000 MCG Tabs Take 10,000 mcg by mouth daily.   Cholecalciferol 125 MCG (5000 UT) Tabs Take 5,000 Units  by mouth daily.   Colchicine 0.6 MG Caps TAKE 1 CAPSULE DAILY AS    NEEDED What changed: See the new instructions.   Cranberry-Vitamin C 250-60 MG Caps Take 1 tablet by mouth daily.   diclofenac Sodium 1 % Gel Commonly known as: VOLTAREN APPLY 2 GRAMS TO AFFECTED AREA(S) 4 TIMES DAILY AS NEEDED What changed: See the new instructions.   enoxaparin 40 MG/0.4ML injection Commonly known as: LOVENOX Inject 40 mg into the skin daily. For 7 days after procedure   gabapentin 300 MG capsule Commonly known as: NEURONTIN Take 300-600 mg by  mouth at bedtime.   insulin degludec 100 UNIT/ML FlexTouch Pen Commonly known as: [name] FlexTouch Inject 0.09 mLs (9 Units total) into the skin daily at 10 pm. What changed: how much to take   losartan 100 MG tablet Commonly known as: COZAAR Take 100 mg by mouth daily.   metoprolol succinate 25 MG 24 hr tablet Commonly known as: TOPROL-XL 1 tablet by mouth daily at bedtime What changed:  how much to take how to take this when to take this   multivitamin with minerals tablet Take 1 tablet by mouth daily. Women's 50+   naproxen sodium 220 MG tablet Commonly known as: ALEVE Take 440 mg by mouth at bedtime. 2 tablets every night at bedtime   OneTouch Delica Lancets 99991111 Misc Use as instructed to check blood sugars 2 times per day dx: e11.65   OneTouch Verio test strip Generic drug: glucose blood Use as instructed to check blood sugars 2 times per day dx: e11.65   Ozempic (1 MG/DOSE) 2 MG/1.5ML Sopn Generic drug: Semaglutide (1 MG/DOSE) INJECT INTO SKIN ONCE A WEEK What changed: See the new instructions.   simvastatin 10 MG tablet Commonly known as: ZOCOR Take 1 tablet (10 mg total) by mouth daily.   Synthroid 25 MCG tablet Generic drug: levothyroxine Take 0.5 tablet (12.5 mcg) by mouth on Mondays through Fridays, then take 1 tablet (25 mcg) by mouth on Saturdays and Sundays. What changed:  how much to take how to take this when to take this   traMADol 50 MG tablet Commonly known as: Ultram Take 1 tablet (50 mg total) by mouth every 8 (eight) hours as needed for up to 3 days.        Follow-up Information     Dillingham, Loel Lofty, DO Follow up in 1 week(s).   Specialty: Plastic Surgery Contact information: [address] [place] [address] [facility] [address] [place], [place] [address] [phone]  Signed: Carola Rhine Ova Gillentine 09/28/2020, 8:21 AM

## 2020-10-01 ENCOUNTER — Ambulatory Visit (INDEPENDENT_AMBULATORY_CARE_PROVIDER_SITE_OTHER): Payer: Medicare HMO

## 2020-10-01 DIAGNOSIS — I129 Hypertensive chronic kidney disease with stage 1 through stage 4 chronic kidney disease, or unspecified chronic kidney disease: Secondary | ICD-10-CM

## 2020-10-01 DIAGNOSIS — N182 Chronic kidney disease, stage 2 (mild): Secondary | ICD-10-CM

## 2020-10-01 DIAGNOSIS — Z794 Long term (current) use of insulin: Secondary | ICD-10-CM

## 2020-10-01 DIAGNOSIS — N183 Chronic kidney disease, stage 3 unspecified: Secondary | ICD-10-CM

## 2020-10-01 DIAGNOSIS — E1122 Type 2 diabetes mellitus with diabetic chronic kidney disease: Secondary | ICD-10-CM

## 2020-10-01 NOTE — Patient Instructions (Signed)
Social Worker Visit Information  Goals we discussed today:   Goals Addressed             This Visit's Progress    Work with SW to manage care coordination needs       Timeframe:  Long-Range Goal Priority:  [employer] Start Date:   12.16.21                                           Next date of contact: 9.9.22  Patient Goals/Self-Care Activities patient will:  -Attend August 10 post-op appointment -Contact SW as needed prior to next scheduled appointment         Follow Up Plan: SW will follow up with patient by phone over the next month   Daneen Schick, BSW, CDP Social Worker, Certified Dementia Practitioner [place] [facility] [place] [facility] [phone]

## 2020-10-01 NOTE — Chronic Care Management (AMB) (Signed)
Chronic Care Management    Social Work Note  10/01/2020 Name: Teresa Golden MRN: [id number] DOB: May 31, 1945  Teresa Golden is a 75 y.o. year old female who is a primary care patient of Glendale Chard, MD. The CCM team was consulted to assist the patient with chronic disease management and/or care coordination needs related to: [employer] .   Engaged with patient by telephone for follow up visit in response to provider referral for social work chronic care management and care coordination services.   Consent to Services:  The patient was given information about Chronic Care Management services, agreed to services, and gave verbal consent prior to initiation of services.  Please see initial visit note for detailed documentation.   Patient agreed to services and consent obtained.   Assessment: Review of patient past medical history, allergies, medications, and health status, including review of relevant consultants reports was performed today as part of a comprehensive evaluation and provision of chronic care management and care coordination services.     SDOH (Social Determinants of Health) assessments and interventions performed:    Advanced Directives Status: Not addressed in this encounter.  CCM Care Plan  Allergies  Allergen Reactions   Meloxicam Other (See Comments)    Fever; muscle aches; "flu-like" symptoms   Other     Rose fever and hay fever     Outpatient Encounter Medications as of 10/01/2020  Medication Sig Note   amLODipine (NORVASC) 5 MG tablet Take 1 tablet by mouth once daily    B Complex-C (B-COMPLEX WITH VITAMIN C) tablet Take 1 tablet by mouth daily.    Biotin 10000 MCG TABS Take 10,000 mcg by mouth daily.    Cholecalciferol 5000 units TABS Take 5,000 Units by mouth daily.    Colchicine 0.6 MG CAPS TAKE 1 CAPSULE DAILY AS    NEEDED 06/20/2020: Patient takes as needed   Cranberry-Vitamin C 250-60 MG CAPS Take 1 tablet by mouth daily. 09/17/2020: AZO    diclofenac Sodium (VOLTAREN) 1 % GEL APPLY 2 GRAMS TO AFFECTED AREA(S) 4 TIMES DAILY AS NEEDED    enoxaparin (LOVENOX) 40 MG/0.4ML injection Inject 40 mg into the skin daily. For 7 days after procedure 09/17/2020: FOR AFTER PROCEDURE    gabapentin (NEURONTIN) 300 MG capsule Take 300-600 mg by mouth at bedtime.    glucose blood (ONETOUCH VERIO) test strip Use as instructed to check blood sugars 2 times per day dx: e11.65    insulin degludec (TRESIBA FLEXTOUCH) 100 UNIT/ML SOPN FlexTouch Pen Inject 0.09 mLs (9 Units total) into the skin daily at 10 pm.    losartan (COZAAR) 100 MG tablet Take 100 mg by mouth daily.    metoprolol succinate (TOPROL-XL) 25 MG 24 hr tablet 1 tablet by mouth daily at bedtime    Multiple Vitamins-Minerals (MULTIVITAMIN WITH MINERALS) tablet Take 1 tablet by mouth daily. Women's 50+    naproxen sodium (ANAPROX) 220 MG tablet Take 440 mg by mouth at bedtime. 2 tablets every night at bedtime 09/17/2020: ALEVE    ondansetron (ZOFRAN) 4 MG tablet TAKE 1 TABLET BY MOUTH EVERY 8 HOURS AS NEEDED FOR NAUSEA FOR VOMITING    OneTouch Delica Lancets 99991111 MISC Use as instructed to check blood sugars 2 times per day dx: e11.65    OZEMPIC, 1 MG/DOSE, 2 MG/1.5ML SOPN INJECT INTO SKIN ONCE A WEEK    simvastatin (ZOCOR) 10 MG tablet Take 1 tablet (10 mg total) by mouth daily.    SYNTHROID 25 MCG tablet Take  0.5 tablet (12.5 mcg) by mouth on Mondays through Fridays, then take 1 tablet (25 mcg) by mouth on Saturdays and Sundays.    No facility-administered encounter medications on file as of 10/01/2020.    Patient Active Problem List   Diagnosis Date Noted   Type 2 diabetes mellitus with stage 3 chronic kidney disease, with long-term current use of insulin ([place]) 11/29/2019   Panniculitis 09/30/2019   Back pain 09/30/2019   Hepatoma ([place]) 01/21/2019   Other cirrhosis of liver ([place]) 07/05/2018   Hypertensive nephropathy 07/05/2018   Chronic renal disease, stage III ([place]) 07/05/2018   Chronic  left shoulder pain 07/05/2018   Hypothyroidism 02/06/2018   Hepatitis C virus infection cured after antiviral drug therapy 02/04/2018   Osteoarthritis of right knee 07/30/2017   S/P laparoscopic sleeve gastrectomy July 2018 08/26/2016   Preop cardiovascular exam 07/30/2016   Dyspnea on exertion 07/30/2016   Class 2 severe obesity due to excess calories with serious comorbidity and body mass index (BMI) of 39.0 to 39.9 in adult ([place]) 07/30/2016   Bilateral lower extremity edema 07/30/2016    Conditions to be addressed/monitored: HTN, DMII, and CKD Stage III ; Transportation  Care Plan : Social Work Care Plan  Updates made by Daneen Schick since 10/01/2020 12:00 AM     Problem: Care Coordination      Long-Range Goal: Collaborate with RN Care Manager to perform appropriate assessments to assist with care coordination needs   Start Date: 03/02/2020  Recent Progress: On track  Priority: Low  Note:   Current Barriers:  Chronic conditions including DM II, CKD III, and HTN which put patient at increased risk of hospitalization Upcomming Panniculectomy without home health in place  Social Work Clinical Goal(s):  patient will work with SW to address concerns related to DM II, CKD III, and HTN and manage care coordination needs  Interventions: 1:1 collaboration with Glendale Chard, MD regarding development and update of comprehensive plan of care as evidenced by provider attestation and co-signature Inter-disciplinary care team collaboration (see longitudinal plan of care) Successful outbound call placed to the patient to assess for transportation barriers for upcomming August 10 appointment Determined the patients sister is able to transport her to her appointment  Assessed for acute care coordination needs Patient reports she is doing well at this time minus foot pain which patient states occurs from time to time Encouraged the patient to contact the care management team as  needed  Patient Goals/Self-Care Activities patient will:  -Attend August 10 post-op appointment -Contact SW as needed prior to next scheduled appointment  Follow up Plan: SW will follow up with patient by phone over the next month       Follow Up Plan: SW will follow up with patient by phone over the next month      Daneen Schick, BSW, CDP Social Worker, Certified Dementia Practitioner [place] [facility] [place] [facility] [phone]

## 2020-10-03 ENCOUNTER — Ambulatory Visit: Payer: Self-pay

## 2020-10-03 ENCOUNTER — Ambulatory Visit (INDEPENDENT_AMBULATORY_CARE_PROVIDER_SITE_OTHER): Payer: Medicare HMO

## 2020-10-03 ENCOUNTER — Other Ambulatory Visit: Payer: Self-pay

## 2020-10-03 ENCOUNTER — Telehealth: Payer: Medicare HMO

## 2020-10-03 VITALS — BP 170/77 | HR 70 | Temp 97.9°F

## 2020-10-03 DIAGNOSIS — Z794 Long term (current) use of insulin: Secondary | ICD-10-CM

## 2020-10-03 DIAGNOSIS — M793 Panniculitis, unspecified: Secondary | ICD-10-CM

## 2020-10-03 DIAGNOSIS — I129 Hypertensive chronic kidney disease with stage 1 through stage 4 chronic kidney disease, or unspecified chronic kidney disease: Secondary | ICD-10-CM

## 2020-10-03 DIAGNOSIS — N183 Chronic kidney disease, stage 3 unspecified: Secondary | ICD-10-CM

## 2020-10-03 DIAGNOSIS — F419 Anxiety disorder, unspecified: Secondary | ICD-10-CM

## 2020-10-03 DIAGNOSIS — N182 Chronic kidney disease, stage 2 (mild): Secondary | ICD-10-CM

## 2020-10-03 NOTE — Progress Notes (Signed)
Patient is S/P panniculectomy by Dr. Marla Roe  &  umbilical hernia repair by Dr. Lavone Neri on 09/26/20 Patient in office today for the following: She called to report that she was having blood coming from the left drain site. She states she had to change the dressing 2x since last night d/t the amount of blood on the dressings & her abdominal binder. Her friend accompanied her to the office today & has been assisting with monitoring the drains & dressings. On exam- both drains and sutures are present- but the left JP drain tubing was no longer in completely & was not holding suction/charge.  Both drains had approximately 20 cc in the bulbs.  The drainage appeared to be sanguinous.  They did not bring the drainage log/amounts with them today. Patient denies any chills/fever & no n/v.  She is eating/drinking and has normal bowel/bladder function.she states she has continued to use her abdominal binder for 24/7- however she did not have it on at today's visit. Her caregiver states that patient has been taking it off periodically.  The patient admitted that it was very uncomfortable to wear the binder- because it was "pressing" into her skin at the lower edge.  I directed her to use ABD pads & t-shirt or tank top for extra padding underneath the garment  Further assessment= Prevena/incisional drain was intact & no complications with the right side JP drain. She did have bruising noted at at the umbilicus. She c/o pain only at the left drain site- but states she has not needed any of the narcotic pain meds- since the day she came home from the surgery.  There is significant amount of swelling  in the abdominal area- but no asymmetry or sign off seroma. I called Matt,PA- & consulted with him about the drain- & he recommended that I remove the left drain. I removed the left drain and applied vaseline/gauze & tape- & instructed pt to do the same until healed. I instructed her to use the binder at all times until  she sees Dr.Dillingham this Fri 10/05/20.   I explained that the right drain is now having to work to evacuate any fluid collection d/t the removal of the left drain & compression is very important to avoid any complications. Patient states an understanding & agrees with the above information. Patient's caregiver  indicated to me privately- that the patient has not been compliant with dressing/drains & compression garment.  She states that she continues to re-apply dressings/garment & secures the drains each time the patient removes them.  She also indicated that the patient has been consuming "alcoholic beverages" during her recovery. I relayed this message to [place], [place]  Patient will call for any concerns or changes- otherwise she will keep her visit with Dr. Marla Roe this Friday.

## 2020-10-04 NOTE — Patient Instructions (Signed)
Continue compression garment  monitor drainage amounts from right JP drain F/U 10/05/20

## 2020-10-05 ENCOUNTER — Ambulatory Visit (INDEPENDENT_AMBULATORY_CARE_PROVIDER_SITE_OTHER): Payer: Medicare HMO | Admitting: Physician Assistant

## 2020-10-05 ENCOUNTER — Other Ambulatory Visit: Payer: Self-pay

## 2020-10-05 DIAGNOSIS — Z9889 Other specified postprocedural states: Secondary | ICD-10-CM

## 2020-10-05 NOTE — Progress Notes (Addendum)
Patient is a 75 year old female s/p panniculectomy performed 09/26/2020 by Dr. Marla Roe and umbilical hernia repair performed by Dr. Lavone Neri who presents to the clinic for her scheduled postoperative appointment.  PMH significant for IDDM, CKD, cirrhosis, and gastric sleeve.  She was most recently seen by [place] 10/03/2020 after she called the office complaining of bleeding at site of left drain.  The drains and sutures were intact, but left JP drain was no longer holding suction.  Drainage appeared to be sanguinous.  She denies any systemic symptoms.  It was noted that, per patient's friend, she had not been consistently wearing her compression garments and has been intermittently consuming alcoholic beverages.  There is no evidence of seroma, hematoma, infection, or other complication.  Plan was for her to continue applying dressings and compressive garments.  On my examination, she states that she is feeling improved.  She is asking that we take off her wound VAC which she says has been bothersome.  Otherwise, she is feeling improved compared to how she felt when she called in a few days ago.  She states that she has not had any home health aides, but that her friend has been assisting her with her care at home.  She has been wearing her binder, as directed.  She denies any fevers, chills, or other systemic symptoms.  She also denies any significant pain or discomfort.  The Prevena wound VAC was removed here in clinic.  There was evidence of skin necrosis and superficial skin was sloughing off at site of incision which was debrided here in clinic.  There is also blister formation immediately inferior to umbilicus.  There was no significant erythema or induration however otherwise concerning for cellulitis.  Her measured area of superficial skin necrosis was approximately 30 x 4.5 cm, approximately 0.1 cm depth.    Order was placed to [facility] for wound care supplies.  Xeroform dressings were placed along  with ABD pads.  Wound care instructions conveyed to patient.  She states that her friend will be able to assist her.  Plan is for her to return in 1 week for ongoing evaluation and management.

## 2020-10-08 ENCOUNTER — Telehealth: Payer: Self-pay

## 2020-10-08 ENCOUNTER — Telehealth: Payer: Self-pay | Admitting: Surgical

## 2020-10-08 NOTE — Telephone Encounter (Signed)
Called pt back, na, left detailed vm advising her that I spoke to [place] at [facility] and she advised to call back in about 30 mins to an hour to give their intake dept time to process it as they have other faxes to process and get into the system to a rep. Adv pt that I would call them back around 3 pm and confirm receipt. Also adv that when a rep receives it, they will reach out to her to complete the process.

## 2020-10-08 NOTE — Chronic Care Management (AMB) (Signed)
Chronic Care Management   CCM RN Visit Note  10/03/2020 Name: Teresa Golden MRN: [id number] DOB: [birth year]  Subjective: Teresa Golden is a 75 y.o. year old female who is a primary care patient of Glendale Chard, MD. The care management team was consulted for assistance with disease management and care coordination needs.    Engaged with patient by telephone for follow up visit in response to provider referral for case management and/or care coordination services.   Consent to Services:  The patient was given information about Chronic Care Management services, agreed to services, and gave verbal consent prior to initiation of services.  Please see initial visit note for detailed documentation.   Patient agreed to services and verbal consent obtained.   Assessment: Review of patient past medical history, allergies, medications, health status, including review of consultants reports, laboratory and other test data, was performed as part of comprehensive evaluation and provision of chronic care management services.   SDOH (Social Determinants of Health) assessments and interventions performed:  Yes, no acute challenges  CCM Care Plan  Allergies  Allergen Reactions   Meloxicam Other (See Comments)    Fever; muscle aches; "flu-like" symptoms   Other     Rose fever and hay fever     Outpatient Encounter Medications as of 10/03/2020  Medication Sig Note   amLODipine (NORVASC) 5 MG tablet Take 1 tablet by mouth once daily    B Complex-C (B-COMPLEX WITH VITAMIN C) tablet Take 1 tablet by mouth daily.    Biotin 10000 MCG TABS Take 10,000 mcg by mouth daily.    Cholecalciferol 5000 units TABS Take 5,000 Units by mouth daily.    Colchicine 0.6 MG CAPS TAKE 1 CAPSULE DAILY AS    NEEDED 06/20/2020: Patient takes as needed   Cranberry-Vitamin C 250-60 MG CAPS Take 1 tablet by mouth daily. 09/17/2020: AZO   diclofenac Sodium (VOLTAREN) 1 % GEL APPLY 2 GRAMS TO AFFECTED AREA(S) 4 TIMES DAILY AS  NEEDED    enoxaparin (LOVENOX) 40 MG/0.4ML injection Inject 40 mg into the skin daily. For 7 days after procedure 09/17/2020: FOR AFTER PROCEDURE    gabapentin (NEURONTIN) 300 MG capsule Take 300-600 mg by mouth at bedtime.    glucose blood (ONETOUCH VERIO) test strip Use as instructed to check blood sugars 2 times per day dx: e11.65    insulin degludec (TRESIBA FLEXTOUCH) 100 UNIT/ML SOPN FlexTouch Pen Inject 0.09 mLs (9 Units total) into the skin daily at 10 pm.    losartan (COZAAR) 100 MG tablet Take 100 mg by mouth daily.    metoprolol succinate (TOPROL-XL) 25 MG 24 hr tablet 1 tablet by mouth daily at bedtime    Multiple Vitamins-Minerals (MULTIVITAMIN WITH MINERALS) tablet Take 1 tablet by mouth daily. Women's 50+    naproxen sodium (ANAPROX) 220 MG tablet Take 440 mg by mouth at bedtime. 2 tablets every night at bedtime 09/17/2020: ALEVE    ondansetron (ZOFRAN) 4 MG tablet TAKE 1 TABLET BY MOUTH EVERY 8 HOURS AS NEEDED FOR NAUSEA FOR VOMITING    OneTouch Delica Lancets 99991111 MISC Use as instructed to check blood sugars 2 times per day dx: e11.65    OZEMPIC, 1 MG/DOSE, 2 MG/1.5ML SOPN INJECT INTO SKIN ONCE A WEEK    simvastatin (ZOCOR) 10 MG tablet Take 1 tablet (10 mg total) by mouth daily.    SYNTHROID 25 MCG tablet Take 0.5 tablet (12.5 mcg) by mouth on Mondays through Fridays, then take 1 tablet (25 mcg) by  mouth on Saturdays and Sundays.    No facility-administered encounter medications on file as of 10/03/2020.    Patient Active Problem List   Diagnosis Date Noted   Type 2 diabetes mellitus with stage 3 chronic kidney disease, with long-term current use of insulin ([place]) 11/29/2019   Panniculitis 09/30/2019   Back pain 09/30/2019   Hepatoma ([place]) 01/21/2019   Other cirrhosis of liver ([place]) 07/05/2018   Hypertensive nephropathy 07/05/2018   Chronic renal disease, stage III ([place]) 07/05/2018   Chronic left shoulder pain 07/05/2018   Hypothyroidism 02/06/2018   Hepatitis C virus  infection cured after antiviral drug therapy 02/04/2018   Osteoarthritis of right knee 07/30/2017   S/P laparoscopic sleeve gastrectomy July 2018 08/26/2016   Preop cardiovascular exam 07/30/2016   Dyspnea on exertion 07/30/2016   Class 2 severe obesity due to excess calories with serious comorbidity and body mass index (BMI) of 39.0 to 39.9 in adult ([place]) 07/30/2016   Bilateral lower extremity edema 07/30/2016    Conditions to be addressed/monitored: DM, CKD III, Hypertensive Nephropathy, Class 2 severe obesity, Anxiety, Panniculitis   Care Plan : Elective Surgery  Updates made by Lynne Logan, RN since 10/03/2020 12:00 AM     Problem: Panniculectomy Procedure   Priority: Medium     Long-Range Goal: Manage stretched out, excess fat and overhanging skin from your abdomen   Start Date: 02/29/2020  Expected End Date: 12/24/2020  Recent Progress: On track  Priority: Medium  Note:   Current Barriers:  Ineffective Self Health Maintenance Currently UNABLE TO independently self manage needs related to chronic health conditions.  Knowledge Deficits related to short term plan for care coordination needs and long term plans for chronic disease management needs Nurse Case Manager Clinical Goal(s):  Patient will work with care management team to address care coordination and chronic disease management needs related to Disease Management Educational Needs Care Coordination Medication Management and Education Psychosocial Support   Interventions:  09/12/20 completed successful outbound call with patient  Determined patient will undergo a Panniculectomy procedure by Dr. Marla Roe on 09/26/20 Determined this procedure is scheduled for outpatient but patient may need inpatient services pending hemodynamic stability following the procedure Determined and discussed the following appointments related to this procedure:  09/04/20-Pre-Op visit for Panniculectomy procedure (Dr. Marla Roe) 09/24/20-Lab  visit for Panniculectomy procedure 09/26/20-Panniculectomy outpatient/inpatient procedure  09/30/20-Post-Op Panniculectomy follow up appointment with Dr. Marla Roe  10/12/20-Post-Op Panniculectomy follow up appointments with Roetta Sessions, PA-C  Determined patient will have a friend available and use an agency referred by [facility] to assist her as needed following surgery Discussed plans with patient for ongoing care management follow up and provided patient with direct contact information for care management team. 10/03/20 completed inbound call with patient  Determined patient is experiencing bleeding around her wound drain that has increased throughout the morning Determined patient contacted her surgeon Dr. Marla Roe to report her symptoms, she was advised to come into the office for further evaluation  Determined patient's friend is driving her into the office now  Instructed patient to continue to follow MD recommendations and to continue to report concerns to her MD as directed  Discussed plans with patient for ongoing care management follow up and provided patient with direct contact information for care management team Patient Self-Care Activities:  Keep the following appointments related to pre-op and post-op Panniculectomy 09/04/20-Pre-Op visit for Panniculectomy procedure (Dr. Marla Roe) 09/24/20-Lab visit for Panniculectomy procedure 09/26/20-Panniculectomy outpatient/inpatient procedure  09/30/20-Post-Op Panniculectomy follow up appointment with Dr. Marla Roe  10/12/20-Post-Op Panniculectomy follow up appointments with Roetta Sessions, PA-C               Follow Up Plan: Telephone follow up appointment with care management team member scheduled for:  10/15/20    Plan:Telephone follow up appointment with care management team member scheduled for:  10/15/20  Barb Merino, RN, BSN, CCM Care Management Coordinator [place] [facility]  Direct Phone:  [phone]

## 2020-10-08 NOTE — Telephone Encounter (Signed)
Received fax from [facility] stating:   The order has been shipped to the pt today. [facility] verified the pt's coverage criteria and provided service to your pt with products that maximize the pt's benefit and limit out of pocket costs.

## 2020-10-08 NOTE — Telephone Encounter (Signed)
Patient called to ask about what she could pick up at the pharmacy for wound care until her order is sent through. Please call to advise [phone]. Thank you.

## 2020-10-08 NOTE — Telephone Encounter (Signed)
Orders were faxed to [facility] today as Catalina Antigua, PA-C informed pt that the orders would be faxed that day she came into the office (8/12) or Monday (8/15) as the CMAs were very busy in office. However, pt was given an Wound Care instructions sheet with required supplies marked and instructions listed before she left the exam room. She was also instructed by provider what supplies she could get from the drugstore and what would have to be processed through insurance. Pt conveyed understanding.   Called [facility] to expedite the order; spoke to [facility] and she stated that she hasn't received the order yet but she would keep a look out for it and would call me back if there were any delays in processing the order. Confirmation page determined that [facility]'s fax # is busy.  Called pt to give update. Adv what supplies she could get from drugstore but Xeroform would have to come from [facility]. Adv her the phone call made to [facility] and that I would call them again around 1 pm to confirm receipt and would call pt back to give update. Pt conveyed understanding. Pt c/o developing chills last night but denies n & v, no fever, no loss of appetite, slightly elevated sugars yesterday and this morning but she believes that is coming from her having SX. Adv pt to monitor her sx closely and to take her temp again around 1 pm so that when I called her she would have the information. She conveyed understanding.

## 2020-10-08 NOTE — Telephone Encounter (Signed)
Patient called to say that she has a fever of 100.5 and chills.  She said that she is having a cup of tea and laying in bed.  Patient called to follow-up on her previous call regarding her wound supplies.  I gave her the information from [place], [place] note in [place].

## 2020-10-08 NOTE — Telephone Encounter (Signed)
Patient is requesting orders to be sent to [facility] for wound care. Please call to advise/ update [phone]. Thank you.

## 2020-10-08 NOTE — Telephone Encounter (Signed)
Called pt and informed her of [facility] shipping supplies today. Pt asked if she would receive them tomorrow. I adv her that I was not sure but [facility] gets supplies to pt's fairly quickly and should receive them this week.

## 2020-10-08 NOTE — Patient Instructions (Signed)
Goals Addressed      Complete Panniculectomy   On track    Timeframe:  Long-Range Goal Priority:  Medium Start Date: 02/29/20                            Expected End Date: 10/24/20  Next Follow up date: 10/15/20  Patient Self-Care Activities:  Keep the following appointments related to pre-op and post-op Panniculectomy 09/04/20-Pre-Op visit for Panniculectomy procedure (Dr. Marla Roe) 09/24/20-Lab visit for Panniculectomy procedure 09/26/20-Panniculectomy outpatient/inpatient procedure  09/30/20-Post-Op Panniculectomy follow up appointment with Dr. Marla Roe  10/12/20-Post-Op Panniculectomy follow up appointments with Roetta Sessions, PA-C

## 2020-10-08 NOTE — Telephone Encounter (Signed)
Called [facility], na, left vm x Garment/textile technologist. She returned my call Enis Gash transferred the call) and confirmed that the supply order was received and was in process.   I called pt back and adv her of update from [facility]. Adv her that they will call her very soon.   Pt adv that she feels a lot better although she was running a fever earlier. She stated that she believes that she was up doing too much and that's what sparked a fever. She didn't report any nausea/vomiting. She reports that she feels fine but is now in bed. I asked her if she had been around anyone sick or if her friend had c/o feeling sick and she said no. Adv her that she had a upcoming appt and that I would give Matt the update. She conveyed understanding.

## 2020-10-10 ENCOUNTER — Telehealth: Payer: Self-pay | Admitting: Physician Assistant

## 2020-10-10 ENCOUNTER — Telehealth: Payer: Self-pay

## 2020-10-10 NOTE — Telephone Encounter (Signed)
I was notified by the team member in clinic that she had called yesterday reporting a fever T-max 100.5 F.  When I spoke with her today, she sounded well.  She denies any ongoing fevers or chills.  She has an appointment scheduled with [place] in 2 days.  She states that part of the reason why she called was she was having difficulty getting supplies from [facility].  She states that she now has all of her necessary wound care supplies and is already seeing improvement with the Xeroform dressings.  I encouraged her to continue checking her temperature regularly and call the office should she develop any new or worsening symptoms.

## 2020-10-10 NOTE — Chronic Care Management (AMB) (Signed)
Chronic Care Management Pharmacy Assistant   Name: Teresa Golden  MRN: [id number] DOB: Aug 02, 1945   Reason for Encounter: Disease State/ Diabetes  Recent office visits:  09-18-2020 Daneen Schick (CCM)  09-20-2020 Kellie Simmering, LPN. Medicare wellness. Shingrix completed  09-20-2020 Glendale Chard, MD. No changes  10-01-2020 Daneen Schick (CCM)  10-03-2020 Lynne Logan, RN (CCM)  Recent consult visits:  09-13-2020 Tanna Furry (Psychiatric). Follow up  09-26-2020 Wallace Going, DO (Plastic surgery). Panniculectomy procedure. START Zofran 4 mg every 6 hours as needed. START Tramadol 50 mg every 8 hours as needed for 3 days.  10-03-2020 [place], RN. Procedure follow up  10-05-2020 Corena Herter, PA-C. Wound drainage  Hospital visits:  None in previous 6 months  Medications: Outpatient Encounter Medications as of 10/10/2020  Medication Sig Note   amLODipine (NORVASC) 5 MG tablet Take 1 tablet by mouth once daily    B Complex-C (B-COMPLEX WITH VITAMIN C) tablet Take 1 tablet by mouth daily.    Biotin 10000 MCG TABS Take 10,000 mcg by mouth daily.    Cholecalciferol 5000 units TABS Take 5,000 Units by mouth daily.    Colchicine 0.6 MG CAPS TAKE 1 CAPSULE DAILY AS    NEEDED 06/20/2020: Patient takes as needed   Cranberry-Vitamin C 250-60 MG CAPS Take 1 tablet by mouth daily. 09/17/2020: AZO   diclofenac Sodium (VOLTAREN) 1 % GEL APPLY 2 GRAMS TO AFFECTED AREA(S) 4 TIMES DAILY AS NEEDED    enoxaparin (LOVENOX) 40 MG/0.4ML injection Inject 40 mg into the skin daily. For 7 days after procedure 09/17/2020: FOR AFTER PROCEDURE    gabapentin (NEURONTIN) 300 MG capsule Take 300-600 mg by mouth at bedtime.    glucose blood (ONETOUCH VERIO) test strip Use as instructed to check blood sugars 2 times per day dx: e11.65    insulin degludec (TRESIBA FLEXTOUCH) 100 UNIT/ML SOPN FlexTouch Pen Inject 0.09 mLs (9 Units total) into the skin daily at 10 pm.    losartan  (COZAAR) 100 MG tablet Take 100 mg by mouth daily.    metoprolol succinate (TOPROL-XL) 25 MG 24 hr tablet 1 tablet by mouth daily at bedtime    Multiple Vitamins-Minerals (MULTIVITAMIN WITH MINERALS) tablet Take 1 tablet by mouth daily. Women's 50+    naproxen sodium (ANAPROX) 220 MG tablet Take 440 mg by mouth at bedtime. 2 tablets every night at bedtime 09/17/2020: ALEVE    ondansetron (ZOFRAN) 4 MG tablet TAKE 1 TABLET BY MOUTH EVERY 8 HOURS AS NEEDED FOR NAUSEA FOR VOMITING    OneTouch Delica Lancets 99991111 MISC Use as instructed to check blood sugars 2 times per day dx: e11.65    OZEMPIC, 1 MG/DOSE, 2 MG/1.5ML SOPN INJECT INTO SKIN ONCE A WEEK    simvastatin (ZOCOR) 10 MG tablet Take 1 tablet (10 mg total) by mouth daily.    SYNTHROID 25 MCG tablet Take 0.5 tablet (12.5 mcg) by mouth on Mondays through Fridays, then take 1 tablet (25 mcg) by mouth on Saturdays and Sundays.    No facility-administered encounter medications on file as of 10/10/2020.   Recent Relevant Labs: Lab Results  Component Value Date/Time   HGBA1C 6.4 (H) 09/18/2020 08:56 AM   HGBA1C 6.2 (H) 05/31/2020 10:41 AM   MICROALBUR 10 02/09/2020 10:59 AM   MICROALBUR 10 01/17/2019 06:09 PM    Kidney Function Lab Results  Component Value Date/Time   CREATININE 0.90 09/18/2020 08:56 AM   CREATININE 0.95 05/31/2020 10:41 AM  GFRNONAA >60 09/18/2020 08:56 AM   GFRAA 67 11/29/2019 02:11 PM    Current antihyperglycemic regimen:  Tresiba 100 unit/ml - inject 10 units at night  Ozempic 1 mg weekly  What recent interventions/DTPs have been made to improve glycemic control:  Educated on A1c and blood sugar goals Complications of diabetes including kidney damage, retinal damage, and cardiovascular disease Benefits of weight loss  Have there been any recent hospitalizations or ED visits since last visit with CPP? No  Patient denies hypoglycemic symptoms  Patient denies hyperglycemic symptoms  How often are you checking  your blood sugar? once daily  What are your blood sugars ranging?  Fasting: 140, 106, 116 Before meals: None After meals: None Bedtime: None  During the week, how often does your blood glucose drop below 70? Never  Are you checking your feet daily/regularly? Patient states   Adherence Review: Is the patient currently on a STATIN medication? Yes Is the patient currently on ACE/ARB medication? Yes Does the patient have >5 day gap between last estimated fill dates? No   Care Gaps: Last medicare wellness 09-20-2020  Star Rating Drugs: Losartan 100 mg- Last filled 08-31-2020 [facility] Simvastatin 10 mg- Last filled 08-31-2020 [facility] Ozempic 1 mg- Patient assistance  [place] Pharmacist Assistant [phone]

## 2020-10-11 ENCOUNTER — Ambulatory Visit: Payer: Medicare HMO | Admitting: Addiction (Substance Use Disorder)

## 2020-10-12 ENCOUNTER — Emergency Department (HOSPITAL_COMMUNITY): Payer: Medicare HMO

## 2020-10-12 ENCOUNTER — Ambulatory Visit (INDEPENDENT_AMBULATORY_CARE_PROVIDER_SITE_OTHER): Payer: Medicare HMO | Admitting: Surgical

## 2020-10-12 ENCOUNTER — Encounter: Payer: Self-pay | Admitting: Surgical

## 2020-10-12 ENCOUNTER — Encounter (HOSPITAL_COMMUNITY): Payer: Self-pay

## 2020-10-12 ENCOUNTER — Other Ambulatory Visit: Payer: Self-pay

## 2020-10-12 ENCOUNTER — Inpatient Hospital Stay (HOSPITAL_COMMUNITY)
Admission: EM | Admit: 2020-10-12 | Discharge: 2020-10-22 | DRG: 857 | Disposition: A | Discharge status: Skilled Nursing Facility | Payer: Medicare HMO | Source: Ambulatory Visit | Attending: Internal Medicine | Admitting: Internal Medicine

## 2020-10-12 DIAGNOSIS — F32A Depression, unspecified: Secondary | ICD-10-CM | POA: Diagnosis present

## 2020-10-12 DIAGNOSIS — T8141XA Infection following a procedure, superficial incisional surgical site, initial encounter: Secondary | ICD-10-CM | POA: Diagnosis present

## 2020-10-12 DIAGNOSIS — L89312 Pressure ulcer of right buttock, stage 2: Secondary | ICD-10-CM | POA: Diagnosis present

## 2020-10-12 DIAGNOSIS — Z8619 Personal history of other infectious and parasitic diseases: Secondary | ICD-10-CM

## 2020-10-12 DIAGNOSIS — E785 Hyperlipidemia, unspecified: Secondary | ICD-10-CM | POA: Diagnosis present

## 2020-10-12 DIAGNOSIS — N179 Acute kidney failure, unspecified: Secondary | ICD-10-CM | POA: Diagnosis present

## 2020-10-12 DIAGNOSIS — L89152 Pressure ulcer of sacral region, stage 2: Secondary | ICD-10-CM | POA: Diagnosis present

## 2020-10-12 DIAGNOSIS — E669 Obesity, unspecified: Secondary | ICD-10-CM | POA: Diagnosis present

## 2020-10-12 DIAGNOSIS — E860 Dehydration: Secondary | ICD-10-CM

## 2020-10-12 DIAGNOSIS — M10072 Idiopathic gout, left ankle and foot: Secondary | ICD-10-CM | POA: Diagnosis present

## 2020-10-12 DIAGNOSIS — T8144XA Sepsis following a procedure, initial encounter: Secondary | ICD-10-CM | POA: Diagnosis present

## 2020-10-12 DIAGNOSIS — T8149XA Infection following a procedure, other surgical site, initial encounter: Secondary | ICD-10-CM | POA: Diagnosis not present

## 2020-10-12 DIAGNOSIS — I96 Gangrene, not elsewhere classified: Secondary | ICD-10-CM

## 2020-10-12 DIAGNOSIS — N1831 Chronic kidney disease, stage 3a: Secondary | ICD-10-CM

## 2020-10-12 DIAGNOSIS — A419 Sepsis, unspecified organism: Secondary | ICD-10-CM | POA: Diagnosis present

## 2020-10-12 DIAGNOSIS — L03311 Cellulitis of abdominal wall: Secondary | ICD-10-CM | POA: Diagnosis present

## 2020-10-12 DIAGNOSIS — M1990 Unspecified osteoarthritis, unspecified site: Secondary | ICD-10-CM | POA: Diagnosis present

## 2020-10-12 DIAGNOSIS — R531 Weakness: Secondary | ICD-10-CM | POA: Diagnosis present

## 2020-10-12 DIAGNOSIS — Z79890 Hormone replacement therapy (postmenopausal): Secondary | ICD-10-CM

## 2020-10-12 DIAGNOSIS — E039 Hypothyroidism, unspecified: Secondary | ICD-10-CM | POA: Diagnosis present

## 2020-10-12 DIAGNOSIS — Z823 Family history of stroke: Secondary | ICD-10-CM

## 2020-10-12 DIAGNOSIS — E1140 Type 2 diabetes mellitus with diabetic neuropathy, unspecified: Secondary | ICD-10-CM | POA: Diagnosis present

## 2020-10-12 DIAGNOSIS — E1122 Type 2 diabetes mellitus with diabetic chronic kidney disease: Secondary | ICD-10-CM | POA: Diagnosis present

## 2020-10-12 DIAGNOSIS — J9382 Other air leak: Secondary | ICD-10-CM | POA: Diagnosis present

## 2020-10-12 DIAGNOSIS — D649 Anemia, unspecified: Secondary | ICD-10-CM | POA: Diagnosis present

## 2020-10-12 DIAGNOSIS — E1165 Type 2 diabetes mellitus with hyperglycemia: Secondary | ICD-10-CM | POA: Diagnosis present

## 2020-10-12 DIAGNOSIS — E1151 Type 2 diabetes mellitus with diabetic peripheral angiopathy without gangrene: Secondary | ICD-10-CM | POA: Diagnosis present

## 2020-10-12 DIAGNOSIS — Z6835 Body mass index (BMI) 35.0-35.9, adult: Secondary | ICD-10-CM

## 2020-10-12 DIAGNOSIS — D62 Acute posthemorrhagic anemia: Secondary | ICD-10-CM | POA: Diagnosis present

## 2020-10-12 DIAGNOSIS — T8131XA Disruption of external operation (surgical) wound, not elsewhere classified, initial encounter: Secondary | ICD-10-CM | POA: Diagnosis present

## 2020-10-12 DIAGNOSIS — I129 Hypertensive chronic kidney disease with stage 1 through stage 4 chronic kidney disease, or unspecified chronic kidney disease: Secondary | ICD-10-CM | POA: Diagnosis present

## 2020-10-12 DIAGNOSIS — T8189XA Other complications of procedures, not elsewhere classified, initial encounter: Secondary | ICD-10-CM | POA: Diagnosis present

## 2020-10-12 DIAGNOSIS — Z90710 Acquired absence of both cervix and uterus: Secondary | ICD-10-CM

## 2020-10-12 DIAGNOSIS — N183 Chronic kidney disease, stage 3 unspecified: Secondary | ICD-10-CM

## 2020-10-12 DIAGNOSIS — M109 Gout, unspecified: Secondary | ICD-10-CM | POA: Diagnosis not present

## 2020-10-12 DIAGNOSIS — Z886 Allergy status to analgesic agent status: Secondary | ICD-10-CM

## 2020-10-12 DIAGNOSIS — Z87891 Personal history of nicotine dependence: Secondary | ICD-10-CM

## 2020-10-12 DIAGNOSIS — Z9884 Bariatric surgery status: Secondary | ICD-10-CM

## 2020-10-12 DIAGNOSIS — Z9889 Other specified postprocedural states: Secondary | ICD-10-CM

## 2020-10-12 DIAGNOSIS — N182 Chronic kidney disease, stage 2 (mild): Secondary | ICD-10-CM | POA: Diagnosis present

## 2020-10-12 DIAGNOSIS — Z20822 Contact with and (suspected) exposure to covid-19: Secondary | ICD-10-CM | POA: Diagnosis present

## 2020-10-12 DIAGNOSIS — K219 Gastro-esophageal reflux disease without esophagitis: Secondary | ICD-10-CM | POA: Diagnosis present

## 2020-10-12 DIAGNOSIS — Z791 Long term (current) use of non-steroidal anti-inflammatories (NSAID): Secondary | ICD-10-CM

## 2020-10-12 DIAGNOSIS — Z96653 Presence of artificial knee joint, bilateral: Secondary | ICD-10-CM | POA: Diagnosis present

## 2020-10-12 DIAGNOSIS — Z794 Long term (current) use of insulin: Secondary | ICD-10-CM | POA: Diagnosis not present

## 2020-10-12 DIAGNOSIS — Z903 Acquired absence of stomach [part of]: Secondary | ICD-10-CM

## 2020-10-12 DIAGNOSIS — L8990 Pressure ulcer of unspecified site, unspecified stage: Secondary | ICD-10-CM | POA: Insufficient documentation

## 2020-10-12 DIAGNOSIS — Z79899 Other long term (current) drug therapy: Secondary | ICD-10-CM

## 2020-10-12 DIAGNOSIS — Z8249 Family history of ischemic heart disease and other diseases of the circulatory system: Secondary | ICD-10-CM

## 2020-10-12 LAB — CBC WITH DIFFERENTIAL/PLATELET
Abs Immature Granulocytes: 0.02 10*3/uL (ref 0.00–0.07)
Basophils Absolute: 0.1 10*3/uL (ref 0.0–0.1)
Basophils Relative: 1 %
Eosinophils Absolute: 0 10*3/uL (ref 0.0–0.5)
Eosinophils Relative: 0 %
HCT: 22.7 % — ABNORMAL LOW (ref 36.0–46.0)
Hemoglobin: 7.3 g/dL — ABNORMAL LOW (ref 12.0–15.0)
Immature Granulocytes: 0 %
Lymphocytes Relative: 26 %
Lymphs Abs: 1.1 10*3/uL (ref 0.7–4.0)
MCH: 28.2 pg (ref 26.0–34.0)
MCHC: 32.2 g/dL (ref 30.0–36.0)
MCV: 87.6 fL (ref 80.0–100.0)
Monocytes Absolute: 1.1 10*3/uL — ABNORMAL HIGH (ref 0.1–1.0)
Monocytes Relative: 24 %
Neutro Abs: 2.2 10*3/uL (ref 1.7–7.7)
Neutrophils Relative %: 49 %
Platelets: 287 10*3/uL (ref 150–400)
RBC: 2.59 MIL/uL — ABNORMAL LOW (ref 3.87–5.11)
RDW: 14.6 % (ref 11.5–15.5)
WBC Morphology: INCREASED
WBC: 4.5 10*3/uL (ref 4.0–10.5)
nRBC: 0 % (ref 0.0–0.2)

## 2020-10-12 LAB — COMPREHENSIVE METABOLIC PANEL
ALT: 13 U/L (ref 0–44)
AST: 21 U/L (ref 15–41)
Albumin: 2.4 g/dL — ABNORMAL LOW (ref 3.5–5.0)
Alkaline Phosphatase: 64 U/L (ref 38–126)
Anion gap: 9 (ref 5–15)
BUN: 16 mg/dL (ref 8–23)
CO2: 22 mmol/L (ref 22–32)
Calcium: 8.3 mg/dL — ABNORMAL LOW (ref 8.9–10.3)
Chloride: 105 mmol/L (ref 98–111)
Creatinine, Ser: 1.39 mg/dL — ABNORMAL HIGH (ref 0.44–1.00)
GFR, Estimated: 39 mL/min — ABNORMAL LOW (ref >60)
Glucose, Bld: 189 mg/dL — ABNORMAL HIGH (ref 70–99)
Potassium: 4.1 mmol/L (ref 3.5–5.1)
Sodium: 136 mmol/L (ref 135–145)
Total Bilirubin: 0.8 mg/dL (ref 0.3–1.2)
Total Protein: 6 g/dL — ABNORMAL LOW (ref 6.5–8.1)

## 2020-10-12 LAB — URINALYSIS, ROUTINE W REFLEX MICROSCOPIC
Bilirubin Urine: NEGATIVE
Glucose, UA: NEGATIVE mg/dL
Hgb urine dipstick: NEGATIVE
Ketones, ur: 5 mg/dL — AB
Leukocytes,Ua: NEGATIVE
Nitrite: NEGATIVE
Protein, ur: NEGATIVE mg/dL
Specific Gravity, Urine: 1.011 (ref 1.005–1.030)
pH: 5 (ref 5.0–8.0)

## 2020-10-12 LAB — RESP PANEL BY RT-PCR (FLU A&B, COVID) ARPGX2
Influenza A by PCR: NEGATIVE
Influenza B by PCR: NEGATIVE
SARS Coronavirus 2 by RT PCR: NEGATIVE

## 2020-10-12 LAB — CBG MONITORING, ED
Glucose-Capillary: 172 mg/dL — ABNORMAL HIGH (ref 70–99)
Glucose-Capillary: 149 mg/dL — ABNORMAL HIGH (ref 70–99)

## 2020-10-12 LAB — PREPARE RBC (CROSSMATCH)

## 2020-10-12 LAB — LACTIC ACID, PLASMA: Lactic Acid, Venous: 1.8 mmol/L (ref 0.5–1.9)

## 2020-10-12 LAB — HEMOGLOBIN AND HEMATOCRIT, BLOOD
HCT: 23 % — ABNORMAL LOW (ref 36.0–46.0)
Hemoglobin: 7.5 g/dL — ABNORMAL LOW (ref 12.0–15.0)

## 2020-10-12 LAB — GLUCOSE, CAPILLARY: Glucose-Capillary: 123 mg/dL — ABNORMAL HIGH (ref 70–99)

## 2020-10-12 LAB — TSH: TSH: 4.574 u[IU]/mL — ABNORMAL HIGH (ref 0.350–4.500)

## 2020-10-12 MED ORDER — INSULIN ASPART 100 UNIT/ML IJ SOLN
0.0000 [IU] | Freq: Three times a day (TID) | INTRAMUSCULAR | Status: DC
  Administered 2020-10-12 – 2020-10-14 (×5): 1 [IU] via SUBCUTANEOUS
  Administered 2020-10-14 – 2020-10-15 (×2): 2 [IU] via SUBCUTANEOUS
  Administered 2020-10-16 (×2): 3 [IU] via SUBCUTANEOUS
  Administered 2020-10-16: 5 [IU] via SUBCUTANEOUS

## 2020-10-12 MED ORDER — SODIUM CHLORIDE 0.9 % IV SOLN
10.0000 mL/h | Freq: Once | INTRAVENOUS | Status: AC
  Administered 2020-10-12: 10 mL/h via INTRAVENOUS

## 2020-10-12 MED ORDER — SODIUM CHLORIDE 0.9 % IV SOLN
2.0000 g | INTRAVENOUS | Status: DC
  Administered 2020-10-13 – 2020-10-17 (×5): 2 g via INTRAVENOUS
  Filled 2020-10-12 (×7): qty 20

## 2020-10-12 MED ORDER — SODIUM CHLORIDE 0.9 % IV SOLN
2.0000 g | Freq: Once | INTRAVENOUS | Status: AC
  Administered 2020-10-12: 2 g via INTRAVENOUS
  Filled 2020-10-12: qty 20

## 2020-10-12 MED ORDER — CLINDAMYCIN PHOSPHATE 900 MG/50ML IV SOLN
900.0000 mg | Freq: Three times a day (TID) | INTRAVENOUS | Status: DC
  Administered 2020-10-13 – 2020-10-15 (×10): 900 mg via INTRAVENOUS
  Filled 2020-10-12 (×13): qty 50

## 2020-10-12 MED ORDER — LEVOTHYROXINE SODIUM 25 MCG PO TABS
12.5000 ug | ORAL_TABLET | ORAL | Status: DC
  Administered 2020-10-15 – 2020-10-22 (×6): 12.5 ug via ORAL
  Filled 2020-10-12 (×6): qty 1

## 2020-10-12 MED ORDER — ALBUTEROL SULFATE (2.5 MG/3ML) 0.083% IN NEBU: 2.5000 mg | INHALATION_SOLUTION | Freq: Four times a day (QID) | RESPIRATORY_TRACT | Status: DC | PRN

## 2020-10-12 MED ORDER — LACTATED RINGERS IV SOLN: INTRAVENOUS | Status: AC

## 2020-10-12 MED ORDER — CLINDAMYCIN PHOSPHATE 600 MG/50ML IV SOLN: 600.0000 mg | Freq: Three times a day (TID) | INTRAVENOUS | Status: DC

## 2020-10-12 MED ORDER — VANCOMYCIN HCL 1500 MG/300ML IV SOLN
1500.0000 mg | Freq: Once | INTRAVENOUS | Status: AC
  Administered 2020-10-12: 1500 mg via INTRAVENOUS
  Filled 2020-10-12: qty 300

## 2020-10-12 MED ORDER — HYDROCODONE-ACETAMINOPHEN 5-325 MG PO TABS
1.0000 | ORAL_TABLET | Freq: Four times a day (QID) | ORAL | Status: DC | PRN
  Administered 2020-10-15 – 2020-10-22 (×11): 1 via ORAL
  Filled 2020-10-12 (×12): qty 1

## 2020-10-12 MED ORDER — INSULIN GLARGINE-YFGN 100 UNIT/ML ~~LOC~~ SOLN
6.0000 [IU] | Freq: Every day | SUBCUTANEOUS | Status: DC
  Administered 2020-10-13 – 2020-10-15 (×3): 6 [IU] via SUBCUTANEOUS
  Filled 2020-10-12 (×6): qty 0.06

## 2020-10-12 MED ORDER — LACTATED RINGERS IV BOLUS (SEPSIS)
1000.0000 mL | Freq: Once | INTRAVENOUS | Status: AC
  Administered 2020-10-12: 1000 mL via INTRAVENOUS

## 2020-10-12 MED ORDER — METOPROLOL SUCCINATE ER 25 MG PO TB24
25.0000 mg | ORAL_TABLET | Freq: Every day | ORAL | Status: DC
  Administered 2020-10-12 – 2020-10-21 (×10): 25 mg via ORAL
  Filled 2020-10-12 (×9): qty 1

## 2020-10-12 MED ORDER — GABAPENTIN 300 MG PO CAPS
600.0000 mg | ORAL_CAPSULE | Freq: Every day | ORAL | Status: DC
  Administered 2020-10-12 – 2020-10-21 (×10): 600 mg via ORAL
  Filled 2020-10-12 (×10): qty 2

## 2020-10-12 MED ORDER — AMLODIPINE BESYLATE 5 MG PO TABS
5.0000 mg | ORAL_TABLET | Freq: Every day | ORAL | Status: DC
  Administered 2020-10-12 – 2020-10-22 (×10): 5 mg via ORAL
  Filled 2020-10-12 (×10): qty 1

## 2020-10-12 MED ORDER — INSULIN ASPART 100 UNIT/ML IJ SOLN
0.0000 [IU] | Freq: Every day | INTRAMUSCULAR | Status: DC
  Administered 2020-10-15: 3 [IU] via SUBCUTANEOUS

## 2020-10-12 MED ORDER — LACTATED RINGERS IV SOLN: INTRAVENOUS | Status: DC

## 2020-10-12 MED ORDER — ACETAMINOPHEN 650 MG RE SUPP: 650.0000 mg | Freq: Four times a day (QID) | RECTAL | Status: DC | PRN

## 2020-10-12 MED ORDER — CLINDAMYCIN PHOSPHATE 600 MG/50ML IV SOLN
600.0000 mg | Freq: Once | INTRAVENOUS | Status: AC
  Administered 2020-10-12: 600 mg via INTRAVENOUS
  Filled 2020-10-12 (×2): qty 50

## 2020-10-12 MED ORDER — LOSARTAN POTASSIUM 50 MG PO TABS
100.0000 mg | ORAL_TABLET | Freq: Every day | ORAL | Status: DC
  Administered 2020-10-14 – 2020-10-22 (×9): 100 mg via ORAL
  Filled 2020-10-12 (×10): qty 2

## 2020-10-12 MED ORDER — VANCOMYCIN HCL 750 MG/150ML IV SOLN
750.0000 mg | INTRAVENOUS | Status: DC
  Administered 2020-10-13 – 2020-10-17 (×5): 750 mg via INTRAVENOUS
  Filled 2020-10-12 (×7): qty 150

## 2020-10-12 MED ORDER — LEVOTHYROXINE SODIUM 25 MCG PO TABS
25.0000 ug | ORAL_TABLET | ORAL | Status: DC
  Administered 2020-10-13 – 2020-10-21 (×4): 25 ug via ORAL
  Filled 2020-10-12 (×6): qty 1

## 2020-10-12 MED ORDER — ACETAMINOPHEN 325 MG PO TABS
650.0000 mg | ORAL_TABLET | Freq: Four times a day (QID) | ORAL | Status: DC | PRN
  Administered 2020-10-12 – 2020-10-13 (×2): 650 mg via ORAL
  Filled 2020-10-12 (×2): qty 2

## 2020-10-12 MED ORDER — LEVOTHYROXINE SODIUM 25 MCG PO TABS: 12.5000 ug | ORAL_TABLET | Freq: Every day | ORAL | Status: DC

## 2020-10-12 MED ORDER — ONDANSETRON HCL 4 MG PO TABS
4.0000 mg | ORAL_TABLET | Freq: Four times a day (QID) | ORAL | Status: DC | PRN
  Administered 2020-10-19: 4 mg via ORAL
  Filled 2020-10-12: qty 1

## 2020-10-12 MED ORDER — ONDANSETRON HCL 4 MG/2ML IJ SOLN
4.0000 mg | Freq: Four times a day (QID) | INTRAMUSCULAR | Status: DC | PRN
  Administered 2020-10-19: 4 mg via INTRAVENOUS
  Filled 2020-10-12: qty 2

## 2020-10-12 MED ORDER — DICLOFENAC SODIUM 1 % EX GEL
2.0000 g | Freq: Four times a day (QID) | CUTANEOUS | Status: DC | PRN
  Administered 2020-10-17 – 2020-10-19 (×3): 2 g via TOPICAL
  Filled 2020-10-12 (×2): qty 100

## 2020-10-12 NOTE — Sepsis Progress Note (Signed)
eLink is following this Code Sepsis. °

## 2020-10-12 NOTE — ED Notes (Signed)
Attempted to call report to 6N; unit requests a call back in about 15 mins to try again.

## 2020-10-12 NOTE — ED Provider Notes (Addendum)
[facility] EMERGENCY DEPARTMENT Provider Note   CSN: [id number] Arrival date & time:        History Chief Complaint  Patient presents with   Wound Infection    Teresa Golden is a 75 y.o. female.  Patient c/o chills, ?fever, in past 1-2 days. Symptoms acute onset, moderate, constant, persistent. Patient is POD [id number] for umbilical hernia repair and panniculectomy for panniculitis. States in past few days, right sided JP drainage more brown/cloudy than prior serosanguinous drainage - drainage also noted to have malodor. Pt also with some skin necrosis in both periumbilical area and along lower abdominal incision. +general weakness. Denies chest pain or discomfort. No cough or uri symptoms. No vomiting or diarrhea. Is having normal bms.  No dysuria or gu c/o.   The history is provided by the patient, medical records and a caregiver.      Past Medical History:  Diagnosis Date   Arthritis    Chronic kidney disease    self reports ckd stage 3    Depression    Diabetes mellitus, type II, insulin dependent ([place])    With neurologic complications. 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    Hypertensive nephropathy 07/05/2018   Hypothyroidism    Hypothyroidism 02/06/2018   Morbid obesity with BMI of 50.0-59.9, adult [facility])    Scoliosis     Patient Active Problem List   Diagnosis Date Noted   Type 2 diabetes mellitus with stage 3 chronic kidney disease, with long-term current use of insulin ([place]) 11/29/2019   Panniculitis 09/30/2019   Back pain 09/30/2019   Hepatoma ([place]) 01/21/2019   Other cirrhosis of liver ([place]) 07/05/2018   Hypertensive nephropathy 07/05/2018   Chronic renal disease, stage III ([place]) 07/05/2018   Chronic  left shoulder pain 07/05/2018   Hypothyroidism 02/06/2018   Hepatitis C virus infection cured after antiviral drug therapy 02/04/2018   Osteoarthritis of right knee 07/30/2017   S/P laparoscopic sleeve gastrectomy July 2018 08/26/2016   Preop cardiovascular exam 07/30/2016   Dyspnea on exertion 07/30/2016   Class 2 severe obesity due to excess calories with serious comorbidity and body mass index (BMI) of 39.0 to 39.9 in adult ([place]) 07/30/2016   Bilateral lower extremity edema 07/30/2016    Past Surgical History:  Procedure Laterality Date   ABDOMINAL HYSTERECTOMY     uterus   CHOLECYSTECTOMY     DIAGNOSTIC LAPAROSCOPY     DILATION AND CURETTAGE OF UTERUS     EYE SURGERY     cataract extraction bilateral    JOINT REPLACEMENT     Left knee   KNEE ARTHROPLASTY Right 07/30/2017   Procedure: RIGHT TOTAL KNEE ARTHROPLASTY WITH COMPUTER NAVIGATION;  Surgeon: Rod Can, MD;  Location: [facility];  Service: Orthopedics;  Laterality: Right;  Needs RNFA   LAPAROSCOPIC GASTRIC BANDING     and reversal   LAPAROSCOPIC GASTRIC SLEEVE RESECTION N/A 08/26/2016   Procedure: LAPAROSCOPIC GASTRIC SLEEVE RESECTION WITH UPPER ENOD;  Surgeon: Johnathan Hausen, MD;  Location: [facility];  Service: General;  Laterality: N/A;   PANNICULECTOMY N/A 09/26/2020   Procedure: PANNICULECTOMY;  Surgeon: Wallace Going, DO;  Location: [facility];  Service: Plastics;  Laterality: N/A;   TONSILLECTOMY  TRANSTHORACIC ECHOCARDIOGRAM  11/2013   EF 65-70%. Normal diastolic Fxn.  Normal Valves.   UMBILICAL HERNIA REPAIR N/A 09/26/2020   Procedure: HERNIA REPAIR UMBILICAL ADULT;  Surgeon: Georganna Skeans, MD;  Location: [facility];  Service: General;  Laterality: N/A;     OB History   No obstetric history on file.     Family History  Problem Relation Age of Onset   Heart attack Mother    Heart failure Mother    Stroke Father     Social History   Tobacco Use   Smoking status: Former    Packs/day: 0.25    Years:  10.00    Pack years: 2.50    Types: Cigarettes   Smokeless tobacco: Never   Tobacco comments:    quit 20 years ago  Vaping Use   Vaping Use: Never used  Substance Use Topics   Alcohol use: Not Currently    Alcohol/week: 1.0 standard drink    Types: 1 Glasses of wine per week    Comment: occasional   Drug use: No    Home Medications Prior to Admission medications   Medication Sig Start Date End Date Taking? Authorizing Provider  amLODipine (NORVASC) 5 MG tablet Take 1 tablet by mouth once daily 08/21/20   [place] Chard, MD  B Complex-C (B-COMPLEX WITH VITAMIN C) tablet Take 1 tablet by mouth daily.    [provider]  Biotin 10000 MCG TABS Take 10,000 mcg by mouth daily.    [provider]  Cholecalciferol 5000 units TABS Take 5,000 Units by mouth daily.    [provider]  Colchicine 0.6 MG CAPS TAKE 1 CAPSULE DAILY AS    NEEDED 10/26/18   [place] Chard, MD  Cranberry-Vitamin C 250-60 MG CAPS Take 1 tablet by mouth daily.    [provider]  diclofenac Sodium (VOLTAREN) 1 % GEL APPLY 2 GRAMS TO AFFECTED AREA(S) 4 TIMES DAILY AS NEEDED 02/28/19   [place] Chard, MD  enoxaparin (LOVENOX) 40 MG/0.4ML injection Inject 40 mg into the skin daily. For 7 days after procedure    [provider]  gabapentin (NEURONTIN) 300 MG capsule Take 300-600 mg by mouth at bedtime. 08/12/16   [provider]  glucose blood (ONETOUCH VERIO) test strip Use as instructed to check blood sugars 2 times per day dx: e11.65 12/29/19   [place] Chard, MD  insulin degludec (TRESIBA FLEXTOUCH) 100 UNIT/ML SOPN FlexTouch Pen Inject 0.09 mLs (9 Units total) into the skin daily at 10 pm. 04/26/18   [place] Chard, MD  losartan (COZAAR) 100 MG tablet Take 100 mg by mouth daily.    [provider]  metoprolol succinate (TOPROL-XL) 25 MG 24 hr tablet 1 tablet by mouth daily at bedtime 08/15/20   [place] Chard, MD  Multiple Vitamins-Minerals (MULTIVITAMIN WITH  MINERALS) tablet Take 1 tablet by mouth daily. Women's 50+    [provider]  naproxen sodium (ANAPROX) 220 MG tablet Take 440 mg by mouth at bedtime. 2 tablets every night at bedtime    [provider]  ondansetron (ZOFRAN) 4 MG tablet TAKE 1 TABLET BY MOUTH EVERY 8 HOURS AS NEEDED FOR NAUSEA FOR VOMITING 09/28/20   Scheeler, Carola Rhine, PA-C  OneTouch Delica Lancets [id number] MISC Use as instructed to check blood sugars 2 times per day dx: e11.65 12/29/19   [place] Chard, MD  [facility], 1 MG/DOSE, 2 MG/1.5ML [facility] INJECT INTO SKIN ONCE A WEEK 08/11/18   [place]ale Chard, MD  simvastatin (ZOCOR) 10 MG  tablet Take 1 tablet (10 mg total) by mouth daily. 03/08/20   Glendale Chard, MD  SYNTHROID 25 MCG tablet Take 0.5 tablet (12.5 mcg) by mouth on Mondays through Fridays, then take 1 tablet (25 mcg) by mouth on Saturdays and Sundays. 01/27/19   Glendale Chard, MD    Allergies    Meloxicam and Other  Review of Systems   Review of Systems  Constitutional:  Positive for chills and fever.  HENT:  Negative for sore throat.   Eyes:  Negative for redness.  Respiratory:  Negative for cough and shortness of breath.   Cardiovascular:  Negative for chest pain.  Gastrointestinal:  Negative for diarrhea and vomiting.  Genitourinary:  Negative for dysuria and flank pain.  Musculoskeletal:  Negative for back pain, neck pain and neck stiffness.  Skin:  Negative for rash.  Neurological:  Negative for headaches.  Hematological:  Does not bruise/bleed easily.  Psychiatric/Behavioral:  Negative for confusion.    Physical Exam Updated Vital Signs BP 134/83 (BP Location: Right Arm)   Pulse (!) 101   Temp 97.6 F (36.4 C) (Oral)   Resp (!) 28   Ht 1.651 m ('5\' 5"'$ )   Wt 97.1 kg   SpO2 98%   BMI 35.61 kg/m   Physical Exam Vitals and nursing note reviewed.  Constitutional:      Appearance: Normal appearance. She is well-developed.  HENT:     Head: Atraumatic.     Nose: Nose normal.      Mouth/Throat:     Mouth: Mucous membranes are moist.  Eyes:     General: No scleral icterus.    Conjunctiva/sclera: Conjunctivae normal.  Neck:     Trachea: No tracheal deviation.     Comments: No stiffness or rigidity.  Cardiovascular:     Rate and Rhythm: Regular rhythm. Tachycardia present.     Pulses: Normal pulses.     Heart sounds: Normal heart sounds. No murmur heard.   No friction rub. No gallop.  Pulmonary:     Effort: Pulmonary effort is normal. No respiratory distress.     Breath sounds: Normal breath sounds.  Abdominal:     General: Bowel sounds are normal. There is no distension.     Palpations: Abdomen is soft.     Tenderness: There is abdominal tenderness. There is no guarding.     Comments: Tenderness periumbilical area and along lower abdominal incision, and right abd tenderness in and around JP drain site - JP reservoir is 75% full of brownish, cloudy drainage, with additional similar drainage along skin entry site of drain w malodor. No crepitus. +skin necrosis periumbilical area and mid to left side of lower abd incision.   Genitourinary:    Comments: No cva tenderness.  Musculoskeletal:        General: No swelling.     Cervical back: Normal range of motion and neck supple. No rigidity. No muscular tenderness.     Right lower leg: No edema.     Left lower leg: No edema.  Skin:    General: Skin is warm and dry.     Findings: No rash.  Neurological:     Mental Status: She is alert.     Comments: Alert, speech normal.   Psychiatric:        Mood and Affect: Mood normal.     ED Results / Procedures / Treatments   Labs (all labs ordered are listed, but only abnormal results are displayed) Results for orders placed or  performed during the hospital encounter of 10/12/20  Resp Panel by RT-PCR (Flu A&B, Covid) Nasopharyngeal Swab   Specimen: Nasopharyngeal Swab; Nasopharyngeal(NP) swabs in vial transport medium  Result Value Ref Range   SARS Coronavirus 2 by RT  PCR NEGATIVE NEGATIVE   Influenza A by PCR NEGATIVE NEGATIVE   Influenza B by PCR NEGATIVE NEGATIVE  Lactic acid, plasma  Result Value Ref Range   Lactic Acid, Venous 1.8 0.5 - 1.9 mmol/L  Comprehensive metabolic panel  Result Value Ref Range   Sodium 136 135 - 145 mmol/L   Potassium 4.1 3.5 - 5.1 mmol/L   Chloride 105 98 - 111 mmol/L   CO2 22 22 - 32 mmol/L   Glucose, Bld 189 (H) 70 - 99 mg/dL   BUN 16 8 - 23 mg/dL   Creatinine, Ser 1.39 (H) 0.44 - 1.00 mg/dL   Calcium 8.3 (L) 8.9 - 10.3 mg/dL   Total Protein 6.0 (L) 6.5 - 8.1 g/dL   Albumin 2.4 (L) 3.5 - 5.0 g/dL   AST 21 15 - 41 U/L   ALT 13 0 - 44 U/L   Alkaline Phosphatase 64 38 - 126 U/L   Total Bilirubin 0.8 0.3 - 1.2 mg/dL   GFR, Estimated 39 (L) >60 mL/min   Anion gap 9 5 - 15  CBC WITH DIFFERENTIAL  Result Value Ref Range   WBC 4.5 4.0 - 10.5 K/uL   RBC 2.59 (L) 3.87 - 5.11 MIL/uL   Hemoglobin 7.3 (L) 12.0 - 15.0 g/dL   HCT 22.7 (L) 36.0 - 46.0 %   MCV 87.6 80.0 - 100.0 fL   MCH 28.2 26.0 - 34.0 pg   MCHC 32.2 30.0 - 36.0 g/dL   RDW 14.6 11.5 - 15.5 %   Platelets 287 150 - 400 K/uL   nRBC 0.0 0.0 - 0.2 %   Neutrophils Relative % 49 %   Neutro Abs 2.2 1.7 - 7.7 K/uL   Lymphocytes Relative 26 %   Lymphs Abs 1.1 0.7 - 4.0 K/uL   Monocytes Relative 24 %   Monocytes Absolute 1.1 (H) 0.1 - 1.0 K/uL   Eosinophils Relative 0 %   Eosinophils Absolute 0.0 0.0 - 0.5 K/uL   Basophils Relative 1 %   Basophils Absolute 0.1 0.0 - 0.1 K/uL   WBC Morphology INCREASED BANDS (>20% BANDS)    RBC Morphology MORPHOLOGY UNREMARKABLE    Smear Review MORPHOLOGY UNREMARKABLE    Immature Granulocytes 0 %   Abs Immature Granulocytes 0.02 0.00 - 0.07 K/uL  CBG monitoring, ED  Result Value Ref Range   Glucose-Capillary 172 (H) 70 - 99 mg/dL  Type and screen  Result Value Ref Range   ABO/RH(D) A POS    Antibody Screen NEG    Sample Expiration      10/15/2020,2359 Performed at [facility], [address],  [place], [place] [address]    Unit Number [id number]    Blood Component Type RED CELLS,LR    Unit division 00    Status of Unit ISSUED    Transfusion Status OK TO TRANSFUSE    Crossmatch Result Compatible   Prepare RBC (crossmatch)  Result Value Ref Range   Order Confirmation      ORDER PROCESSED BY BLOOD BANK Performed at [facility], [place] [address], [place],  [address]   BPAM [facility]  Result Value Ref Range   ISSUE DATE / TIME [id number]    Blood Product Unit Number [id number]  PRODUCT CODE [id number]    Unit Type and Rh 6200    Blood Product Expiration Date [id number]    DG Chest Port 1 View  Result Date: 10/12/2020 CLINICAL DATA:  Questionable sepsis EXAM: PORTABLE CHEST 1 VIEW COMPARISON:  11/15/2015 FINDINGS: Mild cardiomegaly. Atherosclerotic calcification of the aortic knob. No focal airspace consolidation, pleural effusion, or pneumothorax. Degenerative changes of the bilateral shoulders and thoracic spine. IMPRESSION: Mild cardiomegaly. No acute cardiopulmonary findings. Electronically Signed   By: Davina Poke D.O.   On: 10/12/2020 12:32   DG OR LOCAL ABDOMEN  Result Date: 09/26/2020 CLINICAL DATA:  Evaluate for retained foreign body. EXAM: OR LOCAL ABDOMEN COMPARISON:  None. FINDINGS: 1148 hours. Surgical drain overlies the upper abdomen. Surgical clips overlie the abdomen bilaterally. No evidence for unexpected retained radiopaque soft tissue foreign body. Degenerative changes noted in the lumbar spine. Bowel gas pattern is nonspecific. IMPRESSION: Surgical drain overlies the upper abdomen. No evidence for unexpected radiopaque soft tissue foreign body. Findings were called by me to [facility], in the operating room, at 1202 hours on 09/26/2020. Electronically Signed   By: Misty Stanley M.D.   On: 09/26/2020 12:01     EKG None  Radiology CT ABDOMEN PELVIS WO CONTRAST  Result Date: 10/12/2020 CLINICAL DATA:  Abdominal pain.  Recent hernia repair EXAM: CT  ABDOMEN AND PELVIS WITHOUT CONTRAST TECHNIQUE: Multidetector CT imaging of the abdomen and pelvis was performed following the standard protocol without IV contrast. COMPARISON:  Multi phasic CT abdomen, 09/21/2015. KUB, 09/26/2020. Abdominal ultrasound, 07/12/2020. FINDINGS: Suboptimal evaluation secondary to beam hardening artifact from the upper extremities. Lower chest: No acute abnormality. Hepatobiliary: Normal size and overall contour. Known RIGHT hepatic lobe mass is better appreciated on comparison ultrasound and multiphasic CT. Focal liver abnormality is seen. Status post cholecystectomy. No biliary dilatation. Pancreas: Unremarkable. No pancreatic ductal dilatation or surrounding inflammatory changes. Spleen: Normal in size without focal abnormality. Adrenals/Urinary Tract: Adrenal glands are unremarkable. Kidneys are normal, without renal calculi, focal lesion, or hydronephrosis. Bladder is unremarkable. Stomach/Bowel: Small hiatus hernia. Partial gastrectomy. Nonobstructed small bowel. Normal retrocecal appendix. Nondilated colon. Vascular/Lymphatic: Aortic atherosclerosis without aneurysmal dilatation. No enlarged abdominal or pelvic lymph nodes. Reproductive: Status post hysterectomy. No adnexal masses. Other: *Postsurgical changes of recent abdominoplasty/herniorrhaphy, including surgical clips, subcutaneous gas and surgical drain overlying the fascia. *Small fat containing umbilical hernia. *Anterior abdominal wall subcutaneous stranding with small volume layering fluid. No focal drainable collection or abscess. *RIGHT gluteal subcutaneous injection site. Musculoskeletal: Multilevel degenerative changes of imaged spine, worse at the lower lumbar spine. No acute osseous abnormality. IMPRESSION: 1. Postsurgical changes of recent abdominoplasty/herniorrhaphy with subcutaneous gas. Clinical correlation is recommended to evaluate degree of infection or rule out potential necrotizing fasciitis. 2. No focal  drainable collection or abscess. Electronically Signed   By: Michaelle Birks M.D.   On: 10/12/2020 14:43   DG Chest Port 1 View  Result Date: 10/12/2020 CLINICAL DATA:  Questionable sepsis EXAM: PORTABLE CHEST 1 VIEW COMPARISON:  11/15/2015 FINDINGS: Mild cardiomegaly. Atherosclerotic calcification of the aortic knob. No focal airspace consolidation, pleural effusion, or pneumothorax. Degenerative changes of the bilateral shoulders and thoracic spine. IMPRESSION: Mild cardiomegaly. No acute cardiopulmonary findings. Electronically Signed   By: Davina Poke D.O.   On: 10/12/2020 12:32    Procedures Procedures   Medications Ordered in ED Medications  lactated ringers infusion (has no administration in time range)  lactated ringers bolus 1,000 mL (has no administration in time range)  vancomycin (VANCOCIN) IVPB 1000 mg/200 mL  premix (has no administration in time range)  cefTRIAXone (ROCEPHIN) 2 g in sodium chloride 0.9 % 100 mL IVPB (has no administration in time range)    ED Course  I have reviewed the triage vital signs and the nursing notes.  Pertinent labs & imaging results that were available during my care of the patient were reviewed by me and considered in my medical decision making (see chart for details).    MDM Rules/Calculators/A&P                           Iv ns bolus. Cultures sent. Stat labs. Will plan imaging when labs resulted.   IV antibiotics post cultures.  Reviewed nursing notes and prior charts for additional history.   Labs reviewed/interpreted by me - hgb decrease from prior. Pt w weakness/faintness. Will transfuse prbc.   CXR - no pna.   CT reviewed/interpreted by me - inflamm changes noted.   Surgery consulted - discussed pt with Dr Marla Roe - she indicates admit to medicine, fluids, abx. She states admitted team can consult them, and they may plan to revise/wash out in OR Monday.   Subsequently also shared CT findings with Dr Marla Roe when  resulted, and CT findings acknowledged by her at 52.   Medicine consulted for admission. Discussed w hospitalist, as well as discussion with plastic surgery - they will admit/consult plastics/surgery.   Will add dose of clindamycin for additional abx tx.   CRITICAL CARE RE: abd wall infection/sepsis, aki, sym anemia Performed by: Mirna Mires Total critical care time: 40 minutes Critical care time was exclusive of separately billable procedures and treating other patients. Critical care was necessary to treat or prevent imminent or life-threatening deterioration. Critical care was time spent personally by me on the following activities: development of treatment plan with patient and/or surrogate as well as nursing, discussions with consultants, evaluation of patient's response to treatment, examination of patient, obtaining history from patient or surrogate, ordering and performing treatments and interventions, ordering and review of laboratory studies, ordering and review of radiographic studies, pulse oximetry and re-evaluation of patient's condition.      Final Clinical Impression(s) / ED Diagnoses Final diagnoses:  None    Rx / DC Orders ED Discharge Orders     None          Lajean Saver, MD 10/12/20 1600

## 2020-10-12 NOTE — H&P (View-Only) (Signed)
[facility]  Teresa Golden is an 75 y.o. female with past medical history significant for IDDM, CKD, HTN, obesity, and recurrent panniculitis s/p panniculectomy performed 09/26/2020 by Dr. Marla Roe.  Patient was seen earlier today in the office for a 2-week postop appointment at which point she was noted to be shaking and complaining of rigors/chills.  She had been seen 1 week prior in the office and there was some skin necrosis noted on exam.  Her abdominal exam today appeared to be more concerning with thick eschar noted over incision site and necrosis immediately inferior to and involving umbilicus.  911 was called and she was sent to the ED for work-up.  She was found to be anemic with hemoglobin of 7.0 down from baseline of 11-12.  While there was no formal consult yet, wanted to evaluate patient here in the ED given that she will likely be admitted for symptomatic anemia and possibly require our surgical intervention at some point moving forward.  HPI:   Past Medical History:  Diagnosis Date   Arthritis    Chronic kidney disease    self reports ckd stage 3    Depression    Diabetes mellitus, type II, insulin dependent ([place])    With neurologic complications. 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    Hypertensive nephropathy 07/05/2018   Hypothyroidism    Hypothyroidism 02/06/2018   Morbid obesity with BMI of 50.0-59.9, adult ([place])    Scoliosis     Past Surgical History:  Procedure Laterality Date   ABDOMINAL HYSTERECTOMY     uterus   CHOLECYSTECTOMY     DIAGNOSTIC LAPAROSCOPY     DILATION AND CURETTAGE OF UTERUS     EYE SURGERY     cataract extraction bilateral    JOINT REPLACEMENT     Left knee   KNEE  ARTHROPLASTY Right 07/30/2017   Procedure: RIGHT TOTAL KNEE ARTHROPLASTY WITH COMPUTER NAVIGATION;  Surgeon: Rod Can, MD;  Location: [facility];  Service: Orthopedics;  Laterality: Right;  Needs RNFA   LAPAROSCOPIC GASTRIC BANDING     and reversal   LAPAROSCOPIC GASTRIC SLEEVE RESECTION N/A 08/26/2016   Procedure: LAPAROSCOPIC GASTRIC SLEEVE RESECTION WITH UPPER ENOD;  Surgeon: Johnathan Hausen, MD;  Location: [facility];  Service: General;  Laterality: N/A;   PANNICULECTOMY N/A 09/26/2020   Procedure: PANNICULECTOMY;  Surgeon: Wallace Going, DO;  Location: [facility];  Service: Plastics;  Laterality: N/A;   TONSILLECTOMY     TRANSTHORACIC ECHOCARDIOGRAM  11/2013   EF 65-70%. Normal diastolic Fxn.  Normal Valves.   UMBILICAL HERNIA REPAIR N/A 09/26/2020   Procedure: HERNIA REPAIR UMBILICAL ADULT;  Surgeon: Georganna Skeans, MD;  Location: [facility];  Service: General;  Laterality: N/A;    Family History  Problem Relation Age of Onset   Heart attack Mother    Heart failure Mother    Stroke Father     Social History:  reports that she has quit smoking. Her smoking use included cigarettes. She has a 2.50 pack-year smoking history. She has never used smokeless tobacco. She reports that she does not currently use alcohol after a past usage of about 1.0 standard drink per week. She reports that she does not  use drugs.  Allergies:  Allergies  Allergen Reactions   Meloxicam Other (See Comments)    Fever; muscle aches; "flu-like" symptoms   Other     Rose fever and hay fever     Medications: I have reviewed the patient's current medications.  Results for orders placed or performed during the hospital encounter of 10/12/20 (from the past 48 hour(s))  CBG monitoring, ED     Status: Abnormal   Collection Time: 10/12/20 11:48 AM  Result Value Ref Range   Glucose-Capillary 172 (H) 70 - 99 mg/dL    Comment: Glucose reference range applies only to samples taken after fasting for at least 8 hours.   Lactic acid, plasma     Status: None   Collection Time: 10/12/20 12:03 PM  Result Value Ref Range   Lactic Acid, Venous 1.8 0.5 - 1.9 mmol/L    Comment: Performed at [facility] [address] [place], [place] [address]  Resp Panel by RT-PCR (Flu A&B, Covid) Nasopharyngeal Swab     Status: None   Collection Time: 10/12/20 12:05 PM   Specimen: Nasopharyngeal Swab; Nasopharyngeal(NP) swabs in vial transport medium  Result Value Ref Range   SARS Coronavirus 2 by RT PCR NEGATIVE NEGATIVE    Comment: (NOTE) SARS-CoV-2 target nucleic acids are NOT DETECTED.  The SARS-CoV-2 RNA is generally detectable in upper respiratory specimens during the acute phase of infection. The lowest concentration of SARS-CoV-2 viral copies this assay can detect is 138 copies/mL. A negative result does not preclude SARS-Cov-2 infection and should not be used as the sole basis for treatment or other patient management decisions. A negative result may occur with  improper specimen collection/handling, submission of specimen other than nasopharyngeal swab, presence of viral mutation(s) within the areas targeted by this assay, and inadequate number of viral copies(<138 copies/mL). A negative result must be combined with clinical observations, patient history, and epidemiological information. The expected result is Negative.  Fact Sheet for Patients:  EntrepreneurPulse.com.au  Fact Sheet for Healthcare Providers:  IncredibleEmployment.be  This test is no t yet approved or cleared by the [place] FDA and  has been authorized for detection and/or diagnosis of SARS-CoV-2 by FDA under an Emergency Use Authorization (EUA). This EUA will remain  in effect (meaning this test can be used) for the duration of the COVID-19 declaration under Section 564(b)(1) of the Act, 21 U.S.C.section 360bbb-3(b)(1), unless the authorization is terminated  or revoked sooner.        Influenza A by PCR NEGATIVE NEGATIVE   Influenza B by PCR NEGATIVE NEGATIVE    Comment: (NOTE) The Xpert Xpress SARS-CoV-2/FLU/RSV plus assay is intended as an aid in the diagnosis of influenza from Nasopharyngeal swab specimens and should not be used as a sole basis for treatment. Nasal washings and aspirates are unacceptable for Xpert Xpress SARS-CoV-2/FLU/RSV testing.  Fact Sheet for Patients: EntrepreneurPulse.com.au  Fact Sheet for Healthcare Providers: IncredibleEmployment.be  This test is not yet approved or cleared by the [place] FDA and has been authorized for detection and/or diagnosis of SARS-CoV-2 by FDA under an Emergency Use Authorization (EUA). This EUA will remain in effect (meaning this test can be used) for the duration of the COVID-19 declaration under Section 564(b)(1) of the Act, 21 U.S.C. section 360bbb-3(b)(1), unless the authorization is terminated or revoked.  Performed at [facility], [place] [address], [place],  [address]   Comprehensive metabolic panel     Status: Abnormal   Collection Time:  10/12/20 12:05 PM  Result Value Ref Range   Sodium 136 135 - 145 mmol/L   Potassium 4.1 3.5 - 5.1 mmol/L   Chloride 105 98 - 111 mmol/L   CO2 22 22 - 32 mmol/L   Glucose, Bld 189 (H) 70 - 99 mg/dL    Comment: Glucose reference range applies only to samples taken after fasting for at least 8 hours.   BUN 16 8 - 23 mg/dL   Creatinine, Ser 1.39 (H) 0.44 - 1.00 mg/dL   Calcium 8.3 (L) 8.9 - 10.3 mg/dL   Total Protein 6.0 (L) 6.5 - 8.1 g/dL   Albumin 2.4 (L) 3.5 - 5.0 g/dL   AST 21 15 - 41 U/L   ALT 13 0 - 44 U/L   Alkaline Phosphatase 64 38 - 126 U/L   Total Bilirubin 0.8 0.3 - 1.2 mg/dL   GFR, Estimated 39 (L) >60 mL/min    Comment: (NOTE) Calculated using the CKD-EPI Creatinine Equation (2021)    Anion gap 9 5 - 15    Comment: Performed at [facility], [place] [address], [place], [place] [address]   CBC WITH DIFFERENTIAL     Status: Abnormal   Collection Time: 10/12/20 12:05 PM  Result Value Ref Range   WBC 4.5 4.0 - 10.5 K/uL   RBC 2.59 (L) 3.87 - 5.11 MIL/uL   Hemoglobin 7.3 (L) 12.0 - 15.0 g/dL   HCT 22.7 (L) 36.0 - 46.0 %   MCV 87.6 80.0 - 100.0 fL   MCH 28.2 26.0 - 34.0 pg   MCHC 32.2 30.0 - 36.0 g/dL   RDW 14.6 11.5 - 15.5 %   Platelets 287 150 - 400 K/uL   nRBC 0.0 0.0 - 0.2 %   Neutrophils Relative % 49 %   Neutro Abs 2.2 1.7 - 7.7 K/uL   Lymphocytes Relative 26 %   Lymphs Abs 1.1 0.7 - 4.0 K/uL   Monocytes Relative 24 %   Monocytes Absolute 1.1 (H) 0.1 - 1.0 K/uL   Eosinophils Relative 0 %   Eosinophils Absolute 0.0 0.0 - 0.5 K/uL   Basophils Relative 1 %   Basophils Absolute 0.1 0.0 - 0.1 K/uL   WBC Morphology INCREASED BANDS (>20% BANDS)    RBC Morphology MORPHOLOGY UNREMARKABLE    Smear Review MORPHOLOGY UNREMARKABLE    Immature Granulocytes 0 %   Abs Immature Granulocytes 0.02 0.00 - 0.07 K/uL    Comment: Performed at [facility], [place] [address] [place], [place] [address]  Prepare RBC (crossmatch)     Status: None   Collection Time: 10/12/20  1:29 PM  Result Value Ref Range   Order Confirmation      ORDER PROCESSED BY BLOOD BANK Performed at [facility], [place] [address], [place], [place] [address]   Type and screen     Status: None (Preliminary result)   Collection Time: 10/12/20  1:37 PM  Result Value Ref Range   ABO/RH(D) A POS    Antibody Screen NEG    Sample Expiration      10/15/2020,2359 Performed at [facility], [place] [address], [place], [place] [address]    Unit Number [id number]    Blood Component Type RED CELLS,LR    Unit division 00    Status of Unit ISSUED    Transfusion Status OK TO TRANSFUSE    Crossmatch Result Compatible     CT ABDOMEN PELVIS WO CONTRAST  Result Date: 10/12/2020 CLINICAL  DATA:  Abdominal pain.  Recent hernia repair EXAM: CT ABDOMEN AND PELVIS WITHOUT CONTRAST TECHNIQUE: Multidetector CT  imaging of the abdomen and pelvis was performed following the standard protocol without IV contrast. COMPARISON:  Multi phasic CT abdomen, 09/21/2015. KUB, 09/26/2020. Abdominal ultrasound, 07/12/2020. FINDINGS: Suboptimal evaluation secondary to beam hardening artifact from the upper extremities. Lower chest: No acute abnormality. Hepatobiliary: Normal size and overall contour. Known RIGHT hepatic lobe mass is better appreciated on comparison ultrasound and multiphasic CT. Focal liver abnormality is seen. Status post cholecystectomy. No biliary dilatation. Pancreas: Unremarkable. No pancreatic ductal dilatation or surrounding inflammatory changes. Spleen: Normal in size without focal abnormality. Adrenals/Urinary Tract: Adrenal glands are unremarkable. Kidneys are normal, without renal calculi, focal lesion, or hydronephrosis. Bladder is unremarkable. Stomach/Bowel: Small hiatus hernia. Partial gastrectomy. Nonobstructed small bowel. Normal retrocecal appendix. Nondilated colon. Vascular/Lymphatic: Aortic atherosclerosis without aneurysmal dilatation. No enlarged abdominal or pelvic lymph nodes. Reproductive: Status post hysterectomy. No adnexal masses. Other: *Postsurgical changes of recent abdominoplasty/herniorrhaphy, including surgical clips, subcutaneous gas and surgical drain overlying the fascia. *Small fat containing umbilical hernia. *Anterior abdominal wall subcutaneous stranding with small volume layering fluid. No focal drainable collection or abscess. *RIGHT gluteal subcutaneous injection site. Musculoskeletal: Multilevel degenerative changes of imaged spine, worse at the lower lumbar spine. No acute osseous abnormality. IMPRESSION: 1. Postsurgical changes of recent abdominoplasty/herniorrhaphy with subcutaneous gas. Clinical correlation is recommended to evaluate degree of infection or rule out potential necrotizing fasciitis. 2. No focal drainable collection or abscess. Electronically Signed   By:  Michaelle Birks M.D.   On: 10/12/2020 14:43   DG Chest Port 1 View  Result Date: 10/12/2020 CLINICAL DATA:  Questionable sepsis EXAM: PORTABLE CHEST 1 VIEW COMPARISON:  11/15/2015 FINDINGS: Mild cardiomegaly. Atherosclerotic calcification of the aortic knob. No focal airspace consolidation, pleural effusion, or pneumothorax. Degenerative changes of the bilateral shoulders and thoracic spine. IMPRESSION: Mild cardiomegaly. No acute cardiopulmonary findings. Electronically Signed   By: Davina Poke D.O.   On: 10/12/2020 12:32    Review of Systems  Constitutional:  Positive for chills and malaise/fatigue.  Cardiovascular:  Negative for leg swelling.  Gastrointestinal:  Negative for abdominal pain.  Blood pressure (!) 154/52, pulse (!) 103, temperature (!) 100.6 F (38.1 C), temperature source Oral, resp. rate 20, height '5\' 5"'$  (1.651 m), weight 97.1 kg, SpO2 97 %. Physical Exam Constitutional:      General: She is not in acute distress.    Appearance: Normal appearance. She is obese. She is ill-appearing. She is not toxic-appearing.  Cardiovascular:     Rate and Rhythm: Tachycardia present.  Pulmonary:     Effort: No respiratory distress.  Abdominal:     Comments: Soft, nondistended.  No significant abdominal tenderness to palpation.  Large incision from surgery with overlying skin necrosis and eschar.  There is also blistering and skin necrosis noted immediately inferior to umbilicus.  Umbilicus still viable.  JP drain in place, draining yellowish-brown fluid.  No significant induration or surrounding erythema concerning for cellulitis.   Musculoskeletal:     Cervical back: Normal range of motion.     Right lower leg: No edema.     Left lower leg: No edema.  Skin:    Findings: Lesion present.  Neurological:     Mental Status: She is alert and oriented to person, place, and time.  Psychiatric:        Mood and Affect: Mood normal.    Assessment/Plan:  Patient is receiving 1 to 2 unit  PRBC  as well as IV antibiotics.  She lives alone at home does not have anybody to assist with her care.  I believe she would benefit from hospitalist admission for continued IV antibiotics and wound care until surgical debridement.    Will recommend continued daily dressing changes and will discuss with surgical team.  Plan for potential surgical debridement on Monday.  If admitted, recommend NPO after midnight Sunday 8/21.  Wound care recommendation: Xeroform to entire length of abdominal wound, cover with 4 x 4 gauze.  Place ABD pads over top and secure in place with Medipore tape.    Krista Blue, PA-C 10/12/2020, 3:06 PM

## 2020-10-12 NOTE — Progress Notes (Signed)
[facility]  Teresa Golden is an 75 y.o. female with past medical history significant for IDDM, CKD, HTN, obesity, and recurrent panniculitis s/p panniculectomy performed 09/26/2020 by Dr. Marla Roe.  Patient was seen earlier today in the office for a 2-week postop appointment at which point she was noted to be shaking and complaining of rigors/chills.  She had been seen 1 week prior in the office and there was some skin necrosis noted on exam.  Her abdominal exam today appeared to be more concerning with thick eschar noted over incision site and necrosis immediately inferior to and involving umbilicus.  911 was called and she was sent to the ED for work-up.  She was found to be anemic with hemoglobin of 7.0 down from baseline of 11-12.  While there was no formal consult yet, wanted to evaluate patient here in the ED given that she will likely be admitted for symptomatic anemia and possibly require our surgical intervention at some point moving forward.  HPI:   Past Medical History:  Diagnosis Date   Arthritis    Chronic kidney disease    self reports ckd stage 3    Depression    Diabetes mellitus, type II, insulin dependent ([place])    With neurologic complications. 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    Hypertensive nephropathy 07/05/2018   Hypothyroidism    Hypothyroidism 02/06/2018   Morbid obesity with BMI of 50.0-59.9, adult ([place])    Scoliosis     Past Surgical History:  Procedure Laterality Date   ABDOMINAL HYSTERECTOMY     uterus   CHOLECYSTECTOMY     DIAGNOSTIC LAPAROSCOPY     DILATION AND CURETTAGE OF UTERUS     EYE SURGERY     cataract extraction bilateral    JOINT REPLACEMENT     Left knee   KNEE  ARTHROPLASTY Right 07/30/2017   Procedure: RIGHT TOTAL KNEE ARTHROPLASTY WITH COMPUTER NAVIGATION;  Surgeon: Rod Can, MD;  Location: [facility];  Service: Orthopedics;  Laterality: Right;  Needs RNFA   LAPAROSCOPIC GASTRIC BANDING     and reversal   LAPAROSCOPIC GASTRIC SLEEVE RESECTION N/A 08/26/2016   Procedure: LAPAROSCOPIC GASTRIC SLEEVE RESECTION WITH UPPER ENOD;  Surgeon: Johnathan Hausen, MD;  Location: [facility];  Service: General;  Laterality: N/A;   PANNICULECTOMY N/A 09/26/2020   Procedure: PANNICULECTOMY;  Surgeon: Wallace Going, DO;  Location: [facility];  Service: Plastics;  Laterality: N/A;   TONSILLECTOMY     TRANSTHORACIC ECHOCARDIOGRAM  11/2013   EF 65-70%. Normal diastolic Fxn.  Normal Valves.   UMBILICAL HERNIA REPAIR N/A 09/26/2020   Procedure: HERNIA REPAIR UMBILICAL ADULT;  Surgeon: Georganna Skeans, MD;  Location: [facility];  Service: General;  Laterality: N/A;    Family History  Problem Relation Age of Onset   Heart attack Mother    Heart failure Mother    Stroke Father     Social History:  reports that she has quit smoking. Her smoking use included cigarettes. She has a 2.50 pack-year smoking history. She has never used smokeless tobacco. She reports that she does not currently use alcohol after a past usage of about 1.0 standard drink per week. She reports that she does not  use drugs.  Allergies:  Allergies  Allergen Reactions   Meloxicam Other (See Comments)    Fever; muscle aches; "flu-like" symptoms   Other     Rose fever and hay fever     Medications: I have reviewed the patient's current medications.  Results for orders placed or performed during the hospital encounter of 10/12/20 (from the past 48 hour(s))  CBG monitoring, ED     Status: Abnormal   Collection Time: 10/12/20 11:48 AM  Result Value Ref Range   Glucose-Capillary 172 (H) 70 - 99 mg/dL    Comment: Glucose reference range applies only to samples taken after fasting for at least 8 hours.   Lactic acid, plasma     Status: None   Collection Time: 10/12/20 12:03 PM  Result Value Ref Range   Lactic Acid, Venous 1.8 0.5 - 1.9 mmol/L    Comment: Performed at [facility] [address], [place], [place] [address]  Resp Panel by RT-PCR (Flu A&B, Covid) Nasopharyngeal Swab     Status: None   Collection Time: 10/12/20 12:05 PM   Specimen: Nasopharyngeal Swab; Nasopharyngeal(NP) swabs in vial transport medium  Result Value Ref Range   SARS Coronavirus 2 by RT PCR NEGATIVE NEGATIVE    Comment: (NOTE) SARS-CoV-2 target nucleic acids are NOT DETECTED.  The SARS-CoV-2 RNA is generally detectable in upper respiratory specimens during the acute phase of infection. The lowest concentration of SARS-CoV-2 viral copies this assay can detect is 138 copies/mL. A negative result does not preclude SARS-Cov-2 infection and should not be used as the sole basis for treatment or other patient management decisions. A negative result may occur with  improper specimen collection/handling, submission of specimen other than nasopharyngeal swab, presence of viral mutation(s) within the areas targeted by this assay, and inadequate number of viral copies(<138 copies/mL). A negative result must be combined with clinical observations, patient history, and epidemiological information. The expected result is Negative.  Fact Sheet for Patients:  EntrepreneurPulse.com.au  Fact Sheet for Healthcare Providers:  IncredibleEmployment.be  This test is no t yet approved or cleared by the [place] FDA and  has been authorized for detection and/or diagnosis of SARS-CoV-2 by FDA under an Emergency Use Authorization (EUA). This EUA will remain  in effect (meaning this test can be used) for the duration of the COVID-19 declaration under Section 564(b)(1) of the Act, 21 U.S.C.section 360bbb-3(b)(1), unless the authorization is terminated  or revoked sooner.        Influenza A by PCR NEGATIVE NEGATIVE   Influenza B by PCR NEGATIVE NEGATIVE    Comment: (NOTE) The Xpert Xpress SARS-CoV-2/FLU/RSV plus assay is intended as an aid in the diagnosis of influenza from Nasopharyngeal swab specimens and should not be used as a sole basis for treatment. Nasal washings and aspirates are unacceptable for Xpert Xpress SARS-CoV-2/FLU/RSV testing.  Fact Sheet for Patients: EntrepreneurPulse.com.au  Fact Sheet for Healthcare Providers: IncredibleEmployment.be  This test is not yet approved or cleared by the [place] FDA and has been authorized for detection and/or diagnosis of SARS-CoV-2 by FDA under an Emergency Use Authorization (EUA). This EUA will remain in effect (meaning this test can be used) for the duration of the COVID-19 declaration under Section 564(b)(1) of the Act, 21 U.S.C. section 360bbb-3(b)(1), unless the authorization is terminated or revoked.  Performed at [facility], [place] [address] [place], [place] [address]   Comprehensive metabolic panel     Status: Abnormal   Collection Time:  10/12/20 12:05 PM  Result Value Ref Range   Sodium 136 135 - 145 mmol/L   Potassium 4.1 3.5 - 5.1 mmol/L   Chloride 105 98 - 111 mmol/L   CO2 22 22 - 32 mmol/L   Glucose, Bld 189 (H) 70 - 99 mg/dL    Comment: Glucose reference range applies only to samples taken after fasting for at least 8 hours.   BUN 16 8 - 23 mg/dL   Creatinine, Ser 1.39 (H) 0.44 - 1.00 mg/dL   Calcium 8.3 (L) 8.9 - 10.3 mg/dL   Total Protein 6.0 (L) 6.5 - 8.1 g/dL   Albumin 2.4 (L) 3.5 - 5.0 g/dL   AST 21 15 - 41 U/L   ALT 13 0 - 44 U/L   Alkaline Phosphatase 64 38 - 126 U/L   Total Bilirubin 0.8 0.3 - 1.2 mg/dL   GFR, Estimated 39 (L) >60 mL/min    Comment: (NOTE) Calculated using the CKD-EPI Creatinine Equation (2021)    Anion gap 9 5 - 15    Comment: Performed at [facility], [place] [address], [place], [place] [address]   CBC WITH DIFFERENTIAL     Status: Abnormal   Collection Time: 10/12/20 12:05 PM  Result Value Ref Range   WBC 4.5 4.0 - 10.5 K/uL   RBC 2.59 (L) 3.87 - 5.11 MIL/uL   Hemoglobin 7.3 (L) 12.0 - 15.0 g/dL   HCT 22.7 (L) 36.0 - 46.0 %   MCV 87.6 80.0 - 100.0 fL   MCH 28.2 26.0 - 34.0 pg   MCHC 32.2 30.0 - 36.0 g/dL   RDW 14.6 11.5 - 15.5 %   Platelets 287 150 - 400 K/uL   nRBC 0.0 0.0 - 0.2 %   Neutrophils Relative % 49 %   Neutro Abs 2.2 1.7 - 7.7 K/uL   Lymphocytes Relative 26 %   Lymphs Abs 1.1 0.7 - 4.0 K/uL   Monocytes Relative 24 %   Monocytes Absolute 1.1 (H) 0.1 - 1.0 K/uL   Eosinophils Relative 0 %   Eosinophils Absolute 0.0 0.0 - 0.5 K/uL   Basophils Relative 1 %   Basophils Absolute 0.1 0.0 - 0.1 K/uL   WBC Morphology INCREASED BANDS (>20% BANDS)    RBC Morphology MORPHOLOGY UNREMARKABLE    Smear Review MORPHOLOGY UNREMARKABLE    Immature Granulocytes 0 %   Abs Immature Granulocytes 0.02 0.00 - 0.07 K/uL    Comment: Performed at [facility], [place] [address], [place], [place] [address]  Prepare RBC (crossmatch)     Status: None   Collection Time: 10/12/20  1:29 PM  Result Value Ref Range   Order Confirmation      ORDER PROCESSED BY BLOOD BANK Performed at [facility], [place] [address] [place] [address], [place], [place] [address]   Type and screen     Status: None (Preliminary result)   Collection Time: 10/12/20  1:37 PM  Result Value Ref Range   ABO/RH(D) A POS    Antibody Screen NEG    Sample Expiration      10/15/2020,2359 Performed at [facility], [place] [address], [place],  [address]    Unit Number [id number]    Blood Component Type RED CELLS,LR    Unit division 00    Status of Unit ISSUED    Transfusion Status OK TO TRANSFUSE    Crossmatch Result Compatible     CT ABDOMEN PELVIS WO CONTRAST  Result Date: 10/12/2020 CLINICAL  DATA:  Abdominal pain.  Recent hernia repair EXAM: CT ABDOMEN AND PELVIS WITHOUT CONTRAST TECHNIQUE: Multidetector CT  imaging of the abdomen and pelvis was performed following the standard protocol without IV contrast. COMPARISON:  Multi phasic CT abdomen, 09/21/2015. KUB, 09/26/2020. Abdominal ultrasound, 07/12/2020. FINDINGS: Suboptimal evaluation secondary to beam hardening artifact from the upper extremities. Lower chest: No acute abnormality. Hepatobiliary: Normal size and overall contour. Known RIGHT hepatic lobe mass is better appreciated on comparison ultrasound and multiphasic CT. Focal liver abnormality is seen. Status post cholecystectomy. No biliary dilatation. Pancreas: Unremarkable. No pancreatic ductal dilatation or surrounding inflammatory changes. Spleen: Normal in size without focal abnormality. Adrenals/Urinary Tract: Adrenal glands are unremarkable. Kidneys are normal, without renal calculi, focal lesion, or hydronephrosis. Bladder is unremarkable. Stomach/Bowel: Small hiatus hernia. Partial gastrectomy. Nonobstructed small bowel. Normal retrocecal appendix. Nondilated colon. Vascular/Lymphatic: Aortic atherosclerosis without aneurysmal dilatation. No enlarged abdominal or pelvic lymph nodes. Reproductive: Status post hysterectomy. No adnexal masses. Other: *Postsurgical changes of recent abdominoplasty/herniorrhaphy, including surgical clips, subcutaneous gas and surgical drain overlying the fascia. *Small fat containing umbilical hernia. *Anterior abdominal wall subcutaneous stranding with small volume layering fluid. No focal drainable collection or abscess. *RIGHT gluteal subcutaneous injection site. Musculoskeletal: Multilevel degenerative changes of imaged spine, worse at the lower lumbar spine. No acute osseous abnormality. IMPRESSION: 1. Postsurgical changes of recent abdominoplasty/herniorrhaphy with subcutaneous gas. Clinical correlation is recommended to evaluate degree of infection or rule out potential necrotizing fasciitis. 2. No focal drainable collection or abscess. Electronically Signed   By:  Michaelle Birks M.D.   On: 10/12/2020 14:43   DG Chest Port 1 View  Result Date: 10/12/2020 CLINICAL DATA:  Questionable sepsis EXAM: PORTABLE CHEST 1 VIEW COMPARISON:  11/15/2015 FINDINGS: Mild cardiomegaly. Atherosclerotic calcification of the aortic knob. No focal airspace consolidation, pleural effusion, or pneumothorax. Degenerative changes of the bilateral shoulders and thoracic spine. IMPRESSION: Mild cardiomegaly. No acute cardiopulmonary findings. Electronically Signed   By: Davina Poke D.O.   On: 10/12/2020 12:32    Review of Systems  Constitutional:  Positive for chills and malaise/fatigue.  Cardiovascular:  Negative for leg swelling.  Gastrointestinal:  Negative for abdominal pain.  Blood pressure (!) 154/52, pulse (!) 103, temperature (!) 100.6 F (38.1 C), temperature source Oral, resp. rate 20, height '5\' 5"'$  (1.651 m), weight 97.1 kg, SpO2 97 %. Physical Exam Constitutional:      General: She is not in acute distress.    Appearance: Normal appearance. She is obese. She is ill-appearing. She is not toxic-appearing.  Cardiovascular:     Rate and Rhythm: Tachycardia present.  Pulmonary:     Effort: No respiratory distress.  Abdominal:     Comments: Soft, nondistended.  No significant abdominal tenderness to palpation.  Large incision from surgery with overlying skin necrosis and eschar.  There is also blistering and skin necrosis noted immediately inferior to umbilicus.  Umbilicus still viable.  JP drain in place, draining yellowish-brown fluid.  No significant induration or surrounding erythema concerning for cellulitis.   Musculoskeletal:     Cervical back: Normal range of motion.     Right lower leg: No edema.     Left lower leg: No edema.  Skin:    Findings: Lesion present.  Neurological:     Mental Status: She is alert and oriented to person, place, and time.  Psychiatric:        Mood and Affect: Mood normal.    Assessment/Plan:  Patient is receiving 1 to 2 unit  PRBC  as well as IV antibiotics.  She lives alone at home does not have anybody to assist with her care.  I believe she would benefit from hospitalist admission for continued IV antibiotics and wound care until surgical debridement.    Will recommend continued daily dressing changes and will discuss with surgical team.  Plan for potential surgical debridement on Monday.  If admitted, recommend NPO after midnight Sunday 8/21.  Wound care recommendation: Xeroform to entire length of abdominal wound, cover with 4 x 4 gauze.  Place ABD pads over top and secure in place with Medipore tape.    Krista Blue, PA-C 10/12/2020, 3:06 PM

## 2020-10-12 NOTE — Progress Notes (Signed)
Pharmacy Antibiotic Note  Teresa Golden is a 75 y.o. female admitted on 10/12/2020 presenting from plastic surgery clinic with necrotic tissue on lower abdomen, cloudy JP drainage and febrile.  Pharmacy has been consulted for vancomycin dosing.  Ceftriaxone per MD  Plan: Vancomycin 1500 mg IV x 1, then 750 mg IV q 24h (eAUC 512, Goal AUC 400-550, SCr 1.39) Monitor renal function, Cx and clinical progression to narrow Vancomycin levels as needed  Height: '5\' 5"'$  (165.1 cm) Weight: 97.1 kg (214 lb) IBW/kg (Calculated) : 57  Temp (24hrs), Avg:97.6 F (36.4 C), Min:97.6 F (36.4 C), Max:97.6 F (36.4 C)  Recent Labs  Lab 10/12/20 1203 10/12/20 1205  WBC  --  4.5  CREATININE  --  1.39*  LATICACIDVEN 1.8  --     Estimated Creatinine Clearance: 39.7 mL/min (A) (by C-G formula based on SCr of 1.39 mg/dL (H)).    Allergies  Allergen Reactions   Meloxicam Other (See Comments)    Fever; muscle aches; "flu-like" symptoms   Other     Rose fever and hay fever     Bertis Ruddy, PharmD Clinical Pharmacist ED Pharmacist Phone # [phone] 10/12/2020 1:10 PM

## 2020-10-12 NOTE — ED Notes (Signed)
ED Provider at bedside. 

## 2020-10-12 NOTE — H&P (Signed)
History and Physical    LIMA SQUARE [id number] DOB: [birth year] DOA: 10/12/2020  Referring MD/NP/PA: Iven Finn, MD PCP: Glendale Chard, MD  Patient coming from: Plastic surgery clinic  Chief Complaint:   I have personally briefly reviewed patient's old medical records in [place]   HPI: Teresa Golden is a 75 y.o. female with medical history significant of hypertension, diabetes mellitus type 2, hypothyroidism, and obesity presents with wound infection following surgery.  Patient had an elective panniculectomy performed on 8/3 by Dr. Marla Roe during the surgery patient was found to have a supraumbilical hernia which was repaired by Georganna Skeans, MD of general surgery.  History is obtained from the patient and review of records as she is lethargic and having difficulty participating in questioning at this time.  She reports that over the last day or so she had subjective fever, chills, and malaise patient had been tending to the wound as instructed with changing dressings at home.  She lives alone has no family nearby, but her friend had come with her to her routine follow-up appointment today at the plastic surgery clinic.  The patient notes that she has been having drainage of 300 to 400 mL output per day from the drain of brownish-yellow fluid.  She had gone to her routine follow-up today at the plastic surgery office and recommended her to come to the hospital for further evaluation.  ED Course: Upon admission into the emergency department patient was seen to be febrile up to 100.6 F, pulse 91-1 08, respiration 20-28, and all other vital signs maintained.  Labs significant for BC 4.5 with left shift, hemoglobin 7.3, creatinine 1.39, glucose 189, and lactic acid 1.8.  Cultures were obtained.  Patient had been given 1 L of lactated Ringer's, vancomycin, Rocephin, and clindamycin.  Review of Systems  Constitutional:  Positive for chills and fever.  HENT:  Negative for  nosebleeds.   Eyes:  Negative for photophobia and pain.  Respiratory:  Negative for cough and shortness of breath.   Cardiovascular:  Negative for chest pain and leg swelling.  Gastrointestinal:  Positive for abdominal pain. Negative for diarrhea, nausea and vomiting.  Genitourinary:  Negative for dysuria and hematuria.  Musculoskeletal:  Positive for joint pain. Negative for falls.  Neurological:  Positive for weakness. Negative for focal weakness and loss of consciousness.  Psychiatric/Behavioral:  Negative for substance abuse. The patient has insomnia.    Past Medical History:  Diagnosis Date   Arthritis    Chronic kidney disease    self reports ckd stage 3    Depression    Diabetes mellitus, type II, insulin dependent ([place])    With neurologic complications. 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    Hypertensive nephropathy 07/05/2018   Hypothyroidism    Hypothyroidism 02/06/2018   Morbid obesity with BMI of 50.0-59.9, adult ([place])    Scoliosis     Past Surgical History:  Procedure Laterality Date   ABDOMINAL HYSTERECTOMY     uterus   CHOLECYSTECTOMY     DIAGNOSTIC LAPAROSCOPY     DILATION AND CURETTAGE OF UTERUS     EYE SURGERY     cataract extraction bilateral    JOINT REPLACEMENT  Left knee   KNEE ARTHROPLASTY Right 07/30/2017   Procedure: RIGHT TOTAL KNEE ARTHROPLASTY WITH COMPUTER NAVIGATION;  Surgeon: Rod Can, MD;  Location: [facility];  Service: Orthopedics;  Laterality: Right;  Needs RNFA   LAPAROSCOPIC GASTRIC BANDING     and reversal   LAPAROSCOPIC GASTRIC SLEEVE RESECTION N/A 08/26/2016   Procedure: LAPAROSCOPIC GASTRIC SLEEVE RESECTION WITH UPPER ENOD;  Surgeon: Johnathan Hausen, MD;  Location: [facility];  Service:  General;  Laterality: N/A;   PANNICULECTOMY N/A 09/26/2020   Procedure: PANNICULECTOMY;  Surgeon: Wallace Going, DO;  Location: [place];  Service: Plastics;  Laterality: N/A;   TONSILLECTOMY     TRANSTHORACIC ECHOCARDIOGRAM  11/2013   EF 65-70%. Normal diastolic Fxn.  Normal Valves.   UMBILICAL HERNIA REPAIR N/A 09/26/2020   Procedure: HERNIA REPAIR UMBILICAL ADULT;  Surgeon: Georganna Skeans, MD;  Location: [facility];  Service: General;  Laterality: N/A;     reports that she has quit smoking. Her smoking use included cigarettes. She has a 2.50 pack-year smoking history. She has never used smokeless tobacco. She reports that she does not currently use alcohol after a past usage of about 1.0 standard drink per week. She reports that she does not use drugs.  Allergies  Allergen Reactions   Meloxicam Other (See Comments)    Fever; muscle aches; "flu-like" symptoms   Other     Rose fever and hay fever     Family History  Problem Relation Age of Onset   Heart attack Mother    Heart failure Mother    Stroke Father     Prior to Admission medications   Medication Sig Start Date End Date Taking? Authorizing Provider  amLODipine (NORVASC) 5 MG tablet Take 1 tablet by mouth once daily 08/21/20  Yes Glendale Chard, MD  B Complex-C (B-COMPLEX WITH VITAMIN C) tablet Take 1 tablet by mouth daily.   Yes [provider]  Biotin 10000 MCG TABS Take 10,000 mcg by mouth daily.   Yes [provider]  Cholecalciferol 5000 units TABS Take 5,000 Units by mouth daily.   Yes [provider]  Colchicine 0.6 MG CAPS TAKE 1 CAPSULE DAILY AS    NEEDED 10/26/18  Yes Glendale Chard, MD  Cranberry-Vitamin C 250-60 MG CAPS Take 1 tablet by mouth daily as needed (pain).   Yes [provider]  diclofenac Sodium (VOLTAREN) 1 % GEL APPLY 2 GRAMS TO AFFECTED AREA(S) 4 TIMES DAILY AS NEEDED 02/28/19  Yes Glendale Chard, MD  gabapentin (NEURONTIN) 300 MG capsule Take 600 mg by mouth at bedtime.  08/12/16  Yes [provider]  insulin degludec (TRESIBA FLEXTOUCH) 100 UNIT/ML SOPN FlexTouch Pen Inject 0.09 mLs (9 Units total) into the skin daily at 10 pm. 04/26/18  Yes Glendale Chard, MD  losartan (COZAAR) 100 MG tablet Take 100 mg by mouth daily.   Yes [provider]  metoprolol succinate (TOPROL-XL) 25 MG 24 hr tablet 1 tablet by mouth daily at bedtime 08/15/20  Yes Glendale Chard, MD  Multiple Vitamins-Minerals (MULTIVITAMIN WITH MINERALS) tablet Take 1 tablet by mouth daily. Women's 50+   Yes [provider]  naproxen sodium (ANAPROX) 220 MG tablet Take 440 mg by mouth at bedtime. 2 tablets every night at bedtime   Yes [provider]  ondansetron (ZOFRAN) 4 MG tablet TAKE 1 TABLET BY MOUTH EVERY 8 HOURS AS NEEDED FOR NAUSEA FOR VOMITING 09/28/20  Yes Scheeler, Carola Rhine, PA-C  enoxaparin (LOVENOX) 40 MG/0.4ML injection Inject 40 mg  into the skin daily. For 7 days after procedure    [provider]  glucose blood (ONETOUCH VERIO) test strip Use as instructed to check blood sugars 2 times per day dx: e11.65 12/29/19   Glendale Chard, MD  OneTouch Delica Lancets 99991111 MISC Use as instructed to check blood sugars 2 times per day dx: e11.65 12/29/19   Glendale Chard, MD  [facility], 1 MG/DOSE, 2 MG/1.5ML [facility] INJECT INTO SKIN ONCE A WEEK 08/11/18   Glendale Chard, MD  simvastatin (ZOCOR) 10 MG tablet Take 1 tablet (10 mg total) by mouth daily. 03/08/20   Glendale Chard, MD  SYNTHROID 25 MCG tablet Take 0.5 tablet (12.5 mcg) by mouth on Mondays through Fridays, then take 1 tablet (25 mcg) by mouth on Saturdays and Sundays. 01/27/19   Glendale Chard, MD    Physical Exam:  Constitutional: NAD, calm, comfortable Vitals:   10/12/20 1200 10/12/20 1230 10/12/20 1300 10/12/20 1345  BP: (!) 138/57 (!) 151/66 117/82 (!) 150/52  Pulse: 98 91 97 (!) 108  Resp: (!) 25 20 (!) 21 20  Temp:      TempSrc:      SpO2: 97% 97% 97% 97%  Weight:      Height:       Eyes:  PERRL, lids and conjunctivae normal ENMT: Mucous membranes are moist. Posterior pharynx clear of any exudate or lesions.Normal dentition.  Neck: normal, supple, no masses, no thyromegaly Respiratory: clear to auscultation bilaterally, no wheezing, no crackles. Normal respiratory effort. No accessory muscle use.  Cardiovascular: Regular rate and rhythm, no murmurs / rubs / gallops. No extremity edema. 2+ pedal pulses. No carotid bruits.  Abdomen: Lower abdominal tenderness appreciated with palpation.  Bowel sounds present.  Foul-smelling odor to the lower abdominal wound   Right JP drain with 60 cc of serous fluid present.  Musculoskeletal: no clubbing / cyanosis. No joint deformity upper and lower extremities. Good ROM, no contractures. Normal muscle tone.  Skin: Mild erythema surrounding the bellybutton and lower abdominal wound with necrotic appearing tissue as pictured above. Neurologic: CN 2-12 grossly intact. Sensation intact, DTR normal. Strength 5/5 in all 4.  Psychiatric: Normal judgment and insight.  Lethargic, but oriented x 3. Normal mood.     Labs on Admission: I have personally reviewed following labs and imaging studies  CBC: Recent Labs  Lab 10/12/20 1205  WBC 4.5  NEUTROABS 2.2  HGB 7.3*  HCT 22.7*  MCV 87.6  PLT [id number]   Basic Metabolic Panel: Recent Labs  Lab 10/12/20 1205  NA 136  K 4.1  CL 105  CO2 22  GLUCOSE 189*  BUN 16  CREATININE 1.39*  CALCIUM 8.3*   GFR: Estimated Creatinine Clearance: 39.7 mL/min (A) (by C-G formula based on SCr of 1.39 mg/dL (H)). Liver Function Tests: Recent Labs  Lab 10/12/20 1205  AST 21  ALT 13  ALKPHOS 64  BILITOT 0.8  PROT 6.0*  ALBUMIN 2.4*   No results for input(s): LIPASE, AMYLASE in the last 168 hours. No results for input(s): AMMONIA in the last 168 hours. Coagulation Profile: No results for input(s): INR, PROTIME in the last 168 hours. Cardiac Enzymes: No results for input(s): CKTOTAL, CKMB, CKMBINDEX,  TROPONINI in the last 168 hours. BNP (last 3 results) No results for input(s): PROBNP in the last 8760 hours. HbA1C: No results for input(s): HGBA1C in the last 72 hours. CBG: Recent Labs  Lab 10/12/20 1148  GLUCAP 172*   Lipid Profile: No results for input(s): CHOL,  HDL, LDLCALC, TRIG, CHOLHDL, LDLDIRECT in the last 72 hours. Thyroid Function Tests: No results for input(s): TSH, T4TOTAL, FREET4, T3FREE, THYROIDAB in the last 72 hours. Anemia Panel: No results for input(s): VITAMINB12, FOLATE, FERRITIN, TIBC, IRON, RETICCTPCT in the last 72 hours. Urine analysis:    Component Value Date/Time   COLORURINE YELLOW 06/09/2015 1700   APPEARANCEUR CLOUDY (A) 06/09/2015 1700   LABSPEC 1.017 06/09/2015 1700   PHURINE 5.0 06/09/2015 1700   GLUCOSEU NEGATIVE 06/09/2015 1700   HGBUR NEGATIVE 06/09/2015 1700   BILIRUBINUR negative 05/31/2020 1151   KETONESUR NEGATIVE 06/09/2015 1700   PROTEINUR Negative 05/31/2020 1151   PROTEINUR NEGATIVE 06/09/2015 1700   UROBILINOGEN 0.2 05/31/2020 1151   UROBILINOGEN 0.2 10/14/2012 1009   NITRITE positive 05/31/2020 1151   NITRITE NEGATIVE 06/09/2015 1700   LEUKOCYTESUR Small (1+) (A) 05/31/2020 1151   Sepsis Labs: Recent Results (from the past 240 hour(s))  Resp Panel by RT-PCR (Flu A&B, Covid) Nasopharyngeal Swab     Status: None   Collection Time: 10/12/20 12:05 PM   Specimen: Nasopharyngeal Swab; Nasopharyngeal(NP) swabs in vial transport medium  Result Value Ref Range Status   SARS Coronavirus 2 by RT PCR NEGATIVE NEGATIVE Final    Comment: (NOTE) SARS-CoV-2 target nucleic acids are NOT DETECTED.  The SARS-CoV-2 RNA is generally detectable in upper respiratory specimens during the acute phase of infection. The lowest concentration of SARS-CoV-2 viral copies this assay can detect is 138 copies/mL. A negative result does not preclude SARS-Cov-2 infection and should not be used as the sole basis for treatment or other patient management  decisions. A negative result may occur with  improper specimen collection/handling, submission of specimen other than nasopharyngeal swab, presence of viral mutation(s) within the areas targeted by this assay, and inadequate number of viral copies(<138 copies/mL). A negative result must be combined with clinical observations, patient history, and epidemiological information. The expected result is Negative.  Fact Sheet for Patients:  EntrepreneurPulse.com.au  Fact Sheet for Healthcare Providers:  IncredibleEmployment.be  This test is no t yet approved or cleared by the [place] FDA and  has been authorized for detection and/or diagnosis of SARS-CoV-2 by FDA under an Emergency Use Authorization (EUA). This EUA will remain  in effect (meaning this test can be used) for the duration of the COVID-19 declaration under Section 564(b)(1) of the Act, 21 U.S.C.section 360bbb-3(b)(1), unless the authorization is terminated  or revoked sooner.       Influenza A by PCR NEGATIVE NEGATIVE Final   Influenza B by PCR NEGATIVE NEGATIVE Final    Comment: (NOTE) The Xpert Xpress SARS-CoV-2/FLU/RSV plus assay is intended as an aid in the diagnosis of influenza from Nasopharyngeal swab specimens and should not be used as a sole basis for treatment. Nasal washings and aspirates are unacceptable for Xpert Xpress SARS-CoV-2/FLU/RSV testing.  Fact Sheet for Patients: EntrepreneurPulse.com.au  Fact Sheet for Healthcare Providers: IncredibleEmployment.be  This test is not yet approved or cleared by the [place] FDA and has been authorized for detection and/or diagnosis of SARS-CoV-2 by FDA under an Emergency Use Authorization (EUA). This EUA will remain in effect (meaning this test can be used) for the duration of the COVID-19 declaration under Section 564(b)(1) of the Act, 21 U.S.C. section 360bbb-3(b)(1), unless the  authorization is terminated or revoked.  Performed at [facility], [place] [address], [place], [place] [address]      Radiological Exams on Admission: CT ABDOMEN PELVIS WO CONTRAST  Result Date: 10/12/2020 CLINICAL  DATA:  Abdominal pain.  Recent hernia repair EXAM: CT ABDOMEN AND PELVIS WITHOUT CONTRAST TECHNIQUE: Multidetector CT imaging of the abdomen and pelvis was performed following the standard protocol without IV contrast. COMPARISON:  Multi phasic CT abdomen, 09/21/2015. KUB, 09/26/2020. Abdominal ultrasound, 07/12/2020. FINDINGS: Suboptimal evaluation secondary to beam hardening artifact from the upper extremities. Lower chest: No acute abnormality. Hepatobiliary: Normal size and overall contour. Known RIGHT hepatic lobe mass is better appreciated on comparison ultrasound and multiphasic CT. Focal liver abnormality is seen. Status post cholecystectomy. No biliary dilatation. Pancreas: Unremarkable. No pancreatic ductal dilatation or surrounding inflammatory changes. Spleen: Normal in size without focal abnormality. Adrenals/Urinary Tract: Adrenal glands are unremarkable. Kidneys are normal, without renal calculi, focal lesion, or hydronephrosis. Bladder is unremarkable. Stomach/Bowel: Small hiatus hernia. Partial gastrectomy. Nonobstructed small bowel. Normal retrocecal appendix. Nondilated colon. Vascular/Lymphatic: Aortic atherosclerosis without aneurysmal dilatation. No enlarged abdominal or pelvic lymph nodes. Reproductive: Status post hysterectomy. No adnexal masses. Other: *Postsurgical changes of recent abdominoplasty/herniorrhaphy, including surgical clips, subcutaneous gas and surgical drain overlying the fascia. *Small fat containing umbilical hernia. *Anterior abdominal wall subcutaneous stranding with small volume layering fluid. No focal drainable collection or abscess. *RIGHT gluteal subcutaneous injection site. Musculoskeletal: Multilevel degenerative changes of imaged spine,  worse at the lower lumbar spine. No acute osseous abnormality. IMPRESSION: 1. Postsurgical changes of recent abdominoplasty/herniorrhaphy with subcutaneous gas. Clinical correlation is recommended to evaluate degree of infection or rule out potential necrotizing fasciitis. 2. No focal drainable collection or abscess. Electronically Signed   By: Michaelle Birks M.D.   On: 10/12/2020 14:43   DG Chest Port 1 View  Result Date: 10/12/2020 CLINICAL DATA:  Questionable sepsis EXAM: PORTABLE CHEST 1 VIEW COMPARISON:  11/15/2015 FINDINGS: Mild cardiomegaly. Atherosclerotic calcification of the aortic knob. No focal airspace consolidation, pleural effusion, or pneumothorax. Degenerative changes of the bilateral shoulders and thoracic spine. IMPRESSION: Mild cardiomegaly. No acute cardiopulmonary findings. Electronically Signed   By: Davina Poke D.O.   On: 10/12/2020 12:32    EKG: Independently reviewed.  Sinus tachycardia 103 bpm no significant ischemic changes appreciated  Assessment/Plan Sepsis secondary to postoperative wound infection: Acute.  Patient presents postop from a panniculectomy on 8/3 with Dr. Marla Roe found to have concerns for wound infection.  Patient was noted to be febrile up to 101.3 F with tachycardia and tachypnea given concern for sepsis.  Nuss blood cell count was 4.5 with left shift, but lactic acid was reassuring at 1.8, CT scan of the abdomen and pelvis significant for postsurgical changes with concern for subcutaneous gas which rule out potential necrotizing fasciitis was noted.  Patient has been started on empiric antibiotics vancomycin, Rocephin, and clindamycin. -Admit to medical telemetry bed -Follow-up blood cultures -Continue empiric antibiotics of Rocephin, vancomycin, and clindamycin  -Lactic acid -Messaged sent to Dr. Marla Roe via secure chat to make sure that she was formally consulted by the ED provider, but no response.    Normocytic anemia: Acute on chronic.   Hemoglobin down to 7.3 g/dL.  Patient was typed and screened and ordered 1 unit of packed red blood cells.  Suspect secondary to acute blood loss with surgical procedure and/or wound.  Patient had been ordered to be transfused 1 unit of packed red blood cells.  She had been also taking Aleve for pain symptoms as a possible cause bleeding. -Continue with transfusion of 1 unit packed red blood cell -Follow-up stool guaiac -Recheck H&H and transfuse blood products as needed  Acute kidney injury: Patient presents with creatinine  elevated up to 1.39 with BUN within normal limits.  Baseline creatinine had been 0.9 prior to the procedure. -Continue normal saline IV fluids at 75 mL/h  -Avoid nephrotoxic agents like Aleve -Recheck kidney function tomorrow morning  Essential hypertension: Blood pressures currently maintained. Home blood pressure medications include amlodipine 5 mg daily, losartan 100 mg daily, and metoprolol 25 mg nightly. -Continue home regimen as tolerated  Diabetes mellitus type 2: On admission glucose noted to be 189.  Last hemoglobin A1c was noted to be 6.4 on 7/26.  Home regimen Tresiba 9 units nightly and Ozempic 1 mg weekly. -Hypoglycemic protocol -Reduce pharmacy substitution for Tresiba to 6 units nightly -CBGs before every meal with sensitive SSI -Adjust insulin regimen as needed  Hypothyroidism -Add on TSH -Continue Synthroid  Obesity: BMI 35.61 kg/m2  DVT prophylaxis: SCDs due to concern for possible bleeding Code Status:  Full Family Communication: Patient's friend updated at bedside Disposition Plan: To be determined Consults called: Plastics, but unclear if they are formally consulted Admission status: Inpatient require more than 2 midnight stay for postoperative wound infection  Norval Morton MD [facility]   If 7PM-7AM, please contact night-coverage   10/12/2020, 2:58 PM

## 2020-10-12 NOTE — ED Notes (Signed)
Provider at bedside

## 2020-10-12 NOTE — Progress Notes (Signed)
Subjective:     Patient ID: KEIERRA DEHAY, female    DOB: [birth year], 75 y.o.   MRN: [id number]  Chief Complaint  Patient presents with   Post-op Follow-up    HPI: The patient is a 75 y.o. female here for follow-up after panniculectomy with Dr. Marla Roe on 09/26/2020.  She is just over 2 weeks postop.  She presents today with her neighbor.  She reports that over the past 2 days she has been having fevers and chills, she reports that she did not want to call our office as she knew she had an appointment today. She reports that she has been receiving home at help from family with dressing changes to her abdominal wound.   She reports she has been applying Xeroform to her abdomen daily.  She has been continuing to monitor the drain output.  She is not having any shortness of breath or chest pain or vomiting.  She does have some general weakness.  She reports that she has been drinking plenty of fluids, reports drinking Gatorade 0 and water.  She has been having normal bowel movements.  No dysuria.  Review of Systems: Review of Systems  Constitutional:  Positive for chills, fever and malaise/fatigue.  Respiratory:  Negative for cough and shortness of breath.   Cardiovascular:  Negative for chest pain and leg swelling.  Gastrointestinal:  Negative for constipation, diarrhea and vomiting.  Genitourinary:  Negative for dysuria.  Neurological:  Positive for weakness. Negative for focal weakness.    Objective:   Vital Signs There were no vitals taken for this visit. Vital Signs and Nursing Note Reviewed Chaperone present Physical Exam Constitutional:      General: She is not in acute distress.    Appearance: She is not diaphoretic.     Comments: Ill-appearing female, shivering  HENT:     Head: Normocephalic and atraumatic.  Pulmonary:     Effort: Pulmonary effort is normal.  Abdominal:     General: Abdomen is flat. There is no distension.     Palpations: Abdomen is soft.      Tenderness: There is abdominal tenderness in the right lower quadrant, suprapubic area and left lower quadrant. There is no guarding or rebound.       Comments: Right JP drain in place with cloudy yellow-brown drainage.  Appears to be consistent with fat necrosis and serous fluid, difficult to determine if purulence is present.  Patient with significant wound of central abdomen, there is some necrotic tissue present, she has some serosanguineous drainage from the inferior umbilicus incision.  There is also some necrotic tissue present inferior to the umbilicus.  There is no significant cellulitic changes.  No crepitus noted with palpation.  I do notice a foul odor on exam.  Musculoskeletal:     Right lower leg: No edema.     Left lower leg: No edema.  Neurological:     General: No focal deficit present.  Psychiatric:        Mood and Affect: Mood normal.        Behavior: Behavior normal.      Assessment/Plan:     ICD-10-CM   1. S/P panniculectomy  Z98.890       Patient is a 75 year old female status post panniculectomy on 09/26/2020.  She presents to the office today fatigued, weak, ill-appearing.  Given patient's weakened state, EMS was notified and patient was transported to ED for further evaluation.    Recommend continuing with Xeroform dressing changes  if hospitalized or discharged to home, discussed with patient that surgical intervention may be necessary to debride the necrotic tissue of her lower abdomen.  I discussed this briefly with the patient prior to EMS transportation to ED.  I did discuss with the patient that overall on exam I do not see any significant signs of infection, however given her overall health history, living situation at home and and appearance today on exam that evaluation in the ED is recommended.  Discussed with patient that if she was hospitalized we would evaluate in the hospital.  Patient was understanding of this.  Carola Rhine Seema Blum, PA-C 10/12/2020,  12:48 PM

## 2020-10-12 NOTE — ED Triage Notes (Signed)
From 2nd f/u appointment with plastic surgeon  following surgery to have excess lower abdominal skin removed. Was lethargic/weak and with low grade temp at MD office. Also noted necrotic tissue lower abdomen and cloudy drainage in JP drain. Patient reports fever past 2 days.

## 2020-10-12 NOTE — ED Notes (Signed)
Returned from CT.

## 2020-10-13 ENCOUNTER — Encounter (HOSPITAL_COMMUNITY): Payer: Self-pay | Admitting: Internal Medicine

## 2020-10-13 DIAGNOSIS — T8149XA Infection following a procedure, other surgical site, initial encounter: Secondary | ICD-10-CM | POA: Diagnosis not present

## 2020-10-13 LAB — BASIC METABOLIC PANEL
Anion gap: 7 (ref 5–15)
BUN: 17 mg/dL (ref 8–23)
CO2: 22 mmol/L (ref 22–32)
Calcium: 7.8 mg/dL — ABNORMAL LOW (ref 8.9–10.3)
Chloride: 104 mmol/L (ref 98–111)
Creatinine, Ser: 1.43 mg/dL — ABNORMAL HIGH (ref 0.44–1.00)
GFR, Estimated: 38 mL/min — ABNORMAL LOW (ref >60)
Glucose, Bld: 106 mg/dL — ABNORMAL HIGH (ref 70–99)
Potassium: 3.7 mmol/L (ref 3.5–5.1)
Sodium: 133 mmol/L — ABNORMAL LOW (ref 135–145)

## 2020-10-13 LAB — CBC
HCT: 20.9 % — ABNORMAL LOW (ref 36.0–46.0)
Hemoglobin: 7.1 g/dL — ABNORMAL LOW (ref 12.0–15.0)
MCH: 28.7 pg (ref 26.0–34.0)
MCHC: 34 g/dL (ref 30.0–36.0)
MCV: 84.6 fL (ref 80.0–100.0)
Platelets: 228 10*3/uL (ref 150–400)
RBC: 2.47 MIL/uL — ABNORMAL LOW (ref 3.87–5.11)
RDW: 14.8 % (ref 11.5–15.5)
WBC: 5.5 10*3/uL (ref 4.0–10.5)
nRBC: 0.4 % — ABNORMAL HIGH (ref 0.0–0.2)

## 2020-10-13 LAB — GLUCOSE, CAPILLARY
Glucose-Capillary: 88 mg/dL (ref 70–99)
Glucose-Capillary: 122 mg/dL — ABNORMAL HIGH (ref 70–99)
Glucose-Capillary: 134 mg/dL — ABNORMAL HIGH (ref 70–99)
Glucose-Capillary: 116 mg/dL — ABNORMAL HIGH (ref 70–99)

## 2020-10-13 NOTE — Progress Notes (Signed)
   10/13/20 0100  Assess: MEWS Score  Temp 98.6 F (37 C)  BP (!) 97/49  Pulse Rate 67  SpO2 98 %  O2 Device Room Air  Assess: MEWS Score  MEWS Temp 0  MEWS Systolic 1  MEWS Pulse 0  MEWS RR 1  MEWS LOC 0  MEWS Score 2  MEWS Score Color Yellow  Treat  Pain Scale 0-10  Pain Score 0  Notify: Charge Nurse/RN  Name of Charge Nurse/RN Notified Carla, RN  Date Charge Nurse/RN Notified 10/13/20  Time Charge Nurse/RN Notified 0118  Notify: Provider  Provider Name/Title Lovey Newcomer  Date Provider Notified 10/13/20  Time Provider Notified 0116  Notification Type Page  Notification Reason Change in status  Provider response Evaluate remotely  Document  Progress note created (see row info) Yes

## 2020-10-13 NOTE — Progress Notes (Addendum)
PROGRESS NOTE  Teresa Golden [id number] DOB: 10-16-1945 DOA: 10/12/2020 PCP: Glendale Chard, MD  HPI/Recap of past 24 hours: This is a 75 year old female with past medical history significant for insulin-dependent diabetes mellitus chronic kidney disease hypertension obesity and recent panniculitis SP panniculectomy performed on September 26, 2020 by Dr. Elisabeth Cara.  Patient was admitted for anemia and infected wound with eschar  October 13, 2020 Patient seen and examined at bedside she stated that she feels much better today than yesterday she is however complaining of cold but no chills or fever says she ate her food well and she had a bowel movement today   Assessment/Plan: Principal Problem:   Wound infection after surgery Active Problems:   Hypothyroidism   Type 2 diabetes mellitus with stage 3 chronic kidney disease, with long-term current use of insulin (HCC)   Normocytic anemia   AKI (acute kidney injury) ([place])   Obesity (BMI 30-39.9)   Sepsis ([place])  #1 sepsis due to infected panniculectomy with cellulitis of an infected drainage of the incision patient was admitted with tachycardia and hypotension Resolving Continue antibiotics Surgery plans on possibly doing debridement on Monday, August 22.  Continue wound care  2.  Anemia.  Patient received packed RBC hemoglobin is 7.1 we will continue to monitor and transfuse if needed  3.  Essential hypertension blood pressure is on the soft low side we will continue home remedies and hold if needed for low blood pressure  4.  Hypothyroidism continue Synthroid  5.  Diabetes mellitus type 2 last A1c was 6.4 on July 26 continue home meds remedy with Tresiba nightly and Ozempic weekly  6.  Acute kidney injury patient presented with creatinine of 1.  3 9 Continue IV fluid rehydration Code Status: Full  Severity of Illness: The appropriate patient status for this patient is INPATIENT. Inpatient status is judged to be reasonable and  necessary in order to provide the required intensity of service to ensure the patient's safety. The patient's presenting symptoms, physical exam findings, and initial radiographic and laboratory data in the context of their chronic comorbidities is felt to place them at high risk for further clinical deterioration. Furthermore, it is not anticipated that the patient will be medically stable for discharge from the hospital within 2 midnights of admission. The following factors support the patient status of inpatient.   " Needs IV antibiotics infected wound might need debridement  * I certify that at the point of admission it is my clinical judgment that the patient will require inpatient hospital care spanning beyond 2 midnights from the point of admission due to high intensity of service, high risk for further deterioration and high frequency of surveillance required.*   Family Communication: Patient  Disposition Plan: Likely home Status is: Inpatient   Dispo: The patient is from: Home              Anticipated d/c is to:               Anticipated d/c date is:               Patient currently not medically stable for discharge  Consultants: Plastic surgery  Procedures: None  Antimicrobials: Clindamycin Ceftriaxone Vancomycin  DVT prophylaxis: SCD   Objective: Vitals:   10/13/20 0100 10/13/20 0110 10/13/20 0300 10/13/20 0804  BP: (!) 97/49 (!) 98/55 (!) 93/43 (!) 100/42  Pulse: 67  75 74  Resp:   18 18  Temp: 98.6 F (37 C)  99  F (37.2 C) 98.9 F (37.2 C)  TempSrc: Oral  Oral Oral  SpO2: 98%  99% 100%  Weight:      Height:        Intake/Output Summary (Last 24 hours) at 10/13/2020 0938 Last data filed at 10/13/2020 0600 Gross per 24 hour  Intake 3051.36 ml  Output 280 ml  Net 2771.36 ml   Filed Weights   10/12/20 1145  Weight: 97.1 kg   Body mass index is 35.61 kg/m.  Exam:  General: 75 y.o. year-old female well developed well nourished in no acute distress.   Alert and oriented x3. Cardiovascular: Regular rate and rhythm with no rubs or gallops.  No thyromegaly or JVD noted.   Respiratory: Clear to auscultation with no wheezes or rales. Good inspiratory effort. Abdomen: Soft, tender right lower quadrant and suprapubic area nondistended with normal bowel sounds x4 quadrants.  There is a right JP drain Infected surgical wound areas around the umbilicus Musculoskeletal: No lower extremity edema. 2/4 pulses in all 4 extremities. Skin: No ulcerative lesions noted or rashes, Psychiatry: Mood is appropriate for condition and setting Neurology:    Data Reviewed: CBC: Recent Labs  Lab 10/12/20 1205 10/12/20 2131 10/13/20 0120  WBC 4.5  --  5.5  NEUTROABS 2.2  --   --   HGB 7.3* 7.5* 7.1*  HCT 22.7* 23.0* 20.9*  MCV 87.6  --  84.6  PLT 287  --  [id number]   Basic Metabolic Panel: Recent Labs  Lab 10/12/20 1205 10/13/20 0120  NA 136 133*  K 4.1 3.7  CL 105 104  CO2 22 22  GLUCOSE 189* 106*  BUN 16 17  CREATININE 1.39* 1.43*  CALCIUM 8.3* 7.8*   GFR: Estimated Creatinine Clearance: 38.6 mL/min (A) (by C-G formula based on SCr of 1.43 mg/dL (H)). Liver Function Tests: Recent Labs  Lab 10/12/20 1205  AST 21  ALT 13  ALKPHOS 64  BILITOT 0.8  PROT 6.0*  ALBUMIN 2.4*   No results for input(s): LIPASE, AMYLASE in the last 168 hours. No results for input(s): AMMONIA in the last 168 hours. Coagulation Profile: No results for input(s): INR, PROTIME in the last 168 hours. Cardiac Enzymes: No results for input(s): CKTOTAL, CKMB, CKMBINDEX, TROPONINI in the last 168 hours. BNP (last 3 results) No results for input(s): PROBNP in the last 8760 hours. HbA1C: No results for input(s): HGBA1C in the last 72 hours. CBG: Recent Labs  Lab 10/12/20 1148 10/12/20 1759 10/12/20 2124 10/13/20 0737  GLUCAP 172* 149* 123* 88   Lipid Profile: No results for input(s): CHOL, HDL, LDLCALC, TRIG, CHOLHDL, LDLDIRECT in the last 72 hours. Thyroid  Function Tests: Recent Labs    10/12/20 2131  TSH 4.574*   Anemia Panel: No results for input(s): VITAMINB12, FOLATE, FERRITIN, TIBC, IRON, RETICCTPCT in the last 72 hours. Urine analysis:    Component Value Date/Time   COLORURINE YELLOW 10/12/2020 [place] 10/12/2020 1653   LABSPEC 1.011 10/12/2020 1653   PHURINE 5.0 10/12/2020 1653   GLUCOSEU NEGATIVE 10/12/2020 1653   HGBUR NEGATIVE 10/12/2020 1653   BILIRUBINUR NEGATIVE 10/12/2020 1653   BILIRUBINUR negative 05/31/2020 1151   KETONESUR 5 (A) 10/12/2020 1653   PROTEINUR NEGATIVE 10/12/2020 1653   UROBILINOGEN 0.2 05/31/2020 1151   UROBILINOGEN 0.2 10/14/2012 1009   NITRITE NEGATIVE 10/12/2020 1653   LEUKOCYTESUR NEGATIVE 10/12/2020 1653   Sepsis Labs: '@LABRCNTIP'$ (procalcitonin:4,lacticidven:4)  ) Recent Results (from the past 240 hour(s))  Resp Panel by RT-PCR (Flu  A&B, Covid) Nasopharyngeal Swab     Status: None   Collection Time: 10/12/20 12:05 PM   Specimen: Nasopharyngeal Swab; Nasopharyngeal(NP) swabs in vial transport medium  Result Value Ref Range Status   SARS Coronavirus 2 by RT PCR NEGATIVE NEGATIVE Final    Comment: (NOTE) SARS-CoV-2 target nucleic acids are NOT DETECTED.  The SARS-CoV-2 RNA is generally detectable in upper respiratory specimens during the acute phase of infection. The lowest concentration of SARS-CoV-2 viral copies this assay can detect is 138 copies/mL. A negative result does not preclude SARS-Cov-2 infection and should not be used as the sole basis for treatment or other patient management decisions. A negative result may occur with  improper specimen collection/handling, submission of specimen other than nasopharyngeal swab, presence of viral mutation(s) within the areas targeted by this assay, and inadequate number of viral copies(<138 copies/mL). A negative result must be combined with clinical observations, patient history, and epidemiological information. The  expected result is Negative.  Fact Sheet for Patients:  EntrepreneurPulse.com.au  Fact Sheet for Healthcare Providers:  IncredibleEmployment.be  This test is no t yet approved or cleared by the [place] FDA and  has been authorized for detection and/or diagnosis of SARS-CoV-2 by FDA under an Emergency Use Authorization (EUA). This EUA will remain  in effect (meaning this test can be used) for the duration of the COVID-19 declaration under Section 564(b)(1) of the Act, 21 U.S.C.section 360bbb-3(b)(1), unless the authorization is terminated  or revoked sooner.       Influenza A by PCR NEGATIVE NEGATIVE Final   Influenza B by PCR NEGATIVE NEGATIVE Final    Comment: (NOTE) The Xpert Xpress SARS-CoV-2/FLU/RSV plus assay is intended as an aid in the diagnosis of influenza from Nasopharyngeal swab specimens and should not be used as a sole basis for treatment. Nasal washings and aspirates are unacceptable for Xpert Xpress SARS-CoV-2/FLU/RSV testing.  Fact Sheet for Patients: EntrepreneurPulse.com.au  Fact Sheet for Healthcare Providers: IncredibleEmployment.be  This test is not yet approved or cleared by the [place] FDA and has been authorized for detection and/or diagnosis of SARS-CoV-2 by FDA under an Emergency Use Authorization (EUA). This EUA will remain in effect (meaning this test can be used) for the duration of the COVID-19 declaration under Section 564(b)(1) of the Act, 21 U.S.C. section 360bbb-3(b)(1), unless the authorization is terminated or revoked.  Performed at [facility], [place] [address] [place], [place] [address]   Blood Culture (routine x 2)     Status: None (Preliminary result)   Collection Time: 10/12/20 12:05 PM   Specimen: BLOOD  Result Value Ref Range Status   Specimen Description BLOOD SITE NOT SPECIFIED  Final   Special Requests   Final    BOTTLES DRAWN AEROBIC  AND ANAEROBIC Blood Culture results may not be optimal due to an inadequate volume of blood received in culture bottles   Culture   Final    NO GROWTH < 24 HOURS Performed at [facility], [place] [address] [place], [place] [address]    Report Status PENDING  Incomplete  Blood Culture (routine x 2)     Status: None (Preliminary result)   Collection Time: 10/12/20 12:05 PM   Specimen: BLOOD  Result Value Ref Range Status   Specimen Description BLOOD SITE NOT SPECIFIED  Final   Special Requests   Final    BOTTLES DRAWN AEROBIC AND ANAEROBIC Blood Culture results may not be optimal due to an inadequate volume of blood received  in culture bottles   Culture   Final    NO GROWTH < 24 HOURS Performed at [facility], [place] [address] [place], [place] [address]    Report Status PENDING  Incomplete      Studies: CT ABDOMEN PELVIS WO CONTRAST  Result Date: 10/12/2020 CLINICAL DATA:  Abdominal pain.  Recent hernia repair EXAM: CT ABDOMEN AND PELVIS WITHOUT CONTRAST TECHNIQUE: Multidetector CT imaging of the abdomen and pelvis was performed following the standard protocol without IV contrast. COMPARISON:  Multi phasic CT abdomen, 09/21/2015. KUB, 09/26/2020. Abdominal ultrasound, 07/12/2020. FINDINGS: Suboptimal evaluation secondary to beam hardening artifact from the upper extremities. Lower chest: No acute abnormality. Hepatobiliary: Normal size and overall contour. Known RIGHT hepatic lobe mass is better appreciated on comparison ultrasound and multiphasic CT. Focal liver abnormality is seen. Status post cholecystectomy. No biliary dilatation. Pancreas: Unremarkable. No pancreatic ductal dilatation or surrounding inflammatory changes. Spleen: Normal in size without focal abnormality. Adrenals/Urinary Tract: Adrenal glands are unremarkable. Kidneys are normal, without renal calculi, focal lesion, or hydronephrosis. Bladder is unremarkable. Stomach/Bowel: Small hiatus hernia. Partial  gastrectomy. Nonobstructed small bowel. Normal retrocecal appendix. Nondilated colon. Vascular/Lymphatic: Aortic atherosclerosis without aneurysmal dilatation. No enlarged abdominal or pelvic lymph nodes. Reproductive: Status post hysterectomy. No adnexal masses. Other: *Postsurgical changes of recent abdominoplasty/herniorrhaphy, including surgical clips, subcutaneous gas and surgical drain overlying the fascia. *Small fat containing umbilical hernia. *Anterior abdominal wall subcutaneous stranding with small volume layering fluid. No focal drainable collection or abscess. *RIGHT gluteal subcutaneous injection site. Musculoskeletal: Multilevel degenerative changes of imaged spine, worse at the lower lumbar spine. No acute osseous abnormality. IMPRESSION: 1. Postsurgical changes of recent abdominoplasty/herniorrhaphy with subcutaneous gas. Clinical correlation is recommended to evaluate degree of infection or rule out potential necrotizing fasciitis. 2. No focal drainable collection or abscess. Electronically Signed   By: Michaelle Birks M.D.   On: 10/12/2020 14:43   DG Chest Port 1 View  Result Date: 10/12/2020 CLINICAL DATA:  Questionable sepsis EXAM: PORTABLE CHEST 1 VIEW COMPARISON:  11/15/2015 FINDINGS: Mild cardiomegaly. Atherosclerotic calcification of the aortic knob. No focal airspace consolidation, pleural effusion, or pneumothorax. Degenerative changes of the bilateral shoulders and thoracic spine. IMPRESSION: Mild cardiomegaly. No acute cardiopulmonary findings. Electronically Signed   By: Davina Poke D.O.   On: 10/12/2020 12:32    Scheduled Meds:  amLODipine  5 mg Oral Daily   gabapentin  600 mg Oral QHS   insulin aspart  0-5 Units Subcutaneous QHS   insulin aspart  0-9 Units Subcutaneous TID WC   insulin glargine-yfgn  6 Units Subcutaneous QHS   [START ON 10/15/2020] levothyroxine  12.5 mcg Oral Once per day on Mon Tue Wed Thu Fri   levothyroxine  25 mcg Oral Once per day on Sun Sat    losartan  100 mg Oral Daily   metoprolol succinate  25 mg Oral QHS    Continuous Infusions:  cefTRIAXone (ROCEPHIN)  IV     clindamycin (CLEOCIN) IV 900 mg (10/13/20 0823)   vancomycin       LOS: 1 day     Cristal Deer, MD [facility]  To reach me or the doctor on call, go to: [email or web address] Password [facility]  10/13/2020, 9:38 AM

## 2020-10-14 DIAGNOSIS — T8149XA Infection following a procedure, other surgical site, initial encounter: Secondary | ICD-10-CM | POA: Diagnosis not present

## 2020-10-14 LAB — CBC
HCT: 21.8 % — ABNORMAL LOW (ref 36.0–46.0)
Hemoglobin: 7.1 g/dL — ABNORMAL LOW (ref 12.0–15.0)
MCH: 27.7 pg (ref 26.0–34.0)
MCHC: 32.6 g/dL (ref 30.0–36.0)
MCV: 85.2 fL (ref 80.0–100.0)
Platelets: 241 10*3/uL (ref 150–400)
RBC: 2.56 MIL/uL — ABNORMAL LOW (ref 3.87–5.11)
RDW: 15.1 % (ref 11.5–15.5)
WBC: 6.1 10*3/uL (ref 4.0–10.5)
nRBC: 0.3 % — ABNORMAL HIGH (ref 0.0–0.2)

## 2020-10-14 LAB — GLUCOSE, CAPILLARY
Glucose-Capillary: 139 mg/dL — ABNORMAL HIGH (ref 70–99)
Glucose-Capillary: 143 mg/dL — ABNORMAL HIGH (ref 70–99)
Glucose-Capillary: 167 mg/dL — ABNORMAL HIGH (ref 70–99)
Glucose-Capillary: 155 mg/dL — ABNORMAL HIGH (ref 70–99)

## 2020-10-14 LAB — SURGICAL PCR SCREEN
MRSA, PCR: POSITIVE — AB
Staphylococcus aureus: POSITIVE — AB

## 2020-10-14 LAB — BASIC METABOLIC PANEL
Anion gap: 10 (ref 5–15)
BUN: 24 mg/dL — ABNORMAL HIGH (ref 8–23)
CO2: 20 mmol/L — ABNORMAL LOW (ref 22–32)
Calcium: 8.1 mg/dL — ABNORMAL LOW (ref 8.9–10.3)
Chloride: 106 mmol/L (ref 98–111)
Creatinine, Ser: 1.54 mg/dL — ABNORMAL HIGH (ref 0.44–1.00)
GFR, Estimated: 35 mL/min — ABNORMAL LOW (ref >60)
Glucose, Bld: 121 mg/dL — ABNORMAL HIGH (ref 70–99)
Potassium: 3.8 mmol/L (ref 3.5–5.1)
Sodium: 136 mmol/L (ref 135–145)

## 2020-10-14 LAB — PREPARE RBC (CROSSMATCH)

## 2020-10-14 MED ORDER — DIPHENHYDRAMINE HCL 25 MG PO CAPS
25.0000 mg | ORAL_CAPSULE | Freq: Once | ORAL | Status: AC
  Administered 2020-10-14: 25 mg via ORAL
  Filled 2020-10-14: qty 1

## 2020-10-14 MED ORDER — CHLORHEXIDINE GLUCONATE CLOTH 2 % EX PADS
6.0000 | MEDICATED_PAD | Freq: Once | CUTANEOUS | Status: AC
  Administered 2020-10-14: 6 via TOPICAL

## 2020-10-14 MED ORDER — MUPIROCIN 2 % EX OINT
1.0000 "application " | TOPICAL_OINTMENT | Freq: Two times a day (BID) | CUTANEOUS | Status: AC
  Administered 2020-10-14 – 2020-10-19 (×10): 1 via NASAL
  Filled 2020-10-14 (×3): qty 22

## 2020-10-14 MED ORDER — CHLORHEXIDINE GLUCONATE CLOTH 2 % EX PADS: 6.0000 | MEDICATED_PAD | Freq: Every day | CUTANEOUS | Status: DC

## 2020-10-14 MED ORDER — SODIUM CHLORIDE 0.9% IV SOLUTION: Freq: Once | INTRAVENOUS | Status: AC

## 2020-10-14 MED ORDER — CHLORHEXIDINE GLUCONATE CLOTH 2 % EX PADS: 6.0000 | MEDICATED_PAD | Freq: Once | CUTANEOUS | Status: DC

## 2020-10-14 MED ORDER — ACETAMINOPHEN 325 MG PO TABS
650.0000 mg | ORAL_TABLET | Freq: Once | ORAL | Status: AC
  Administered 2020-10-14: 650 mg via ORAL
  Filled 2020-10-14: qty 2

## 2020-10-14 NOTE — Progress Notes (Signed)
PROGRESS NOTE  Teresa Golden [id number] DOB: September 01, 1945 DOA: 10/12/2020 PCP: Glendale Chard, MD  HPI/Recap of past 24 hours: This is a 75 year old female with past medical history significant for insulin-dependent diabetes mellitus chronic kidney disease hypertension obesity and recent panniculitis SP panniculectomy performed on September 26, 2020 by Dr. Elisabeth Cara.  Patient was admitted for anemia and infected wound with eschar  October 13, 2020 Patient seen and examined at bedside she stated that she feels much better today than yesterday she is however complaining of cold but no chills or fever says she ate her food well and she had a bowel movement today  October 14, 2020: Patient seen and examined at bedside.  She denies any complaints states she had a good night. Patient was seen by plastic and the plan on doing surgery tomorrow for the eschar   Assessment/Plan: Principal Problem:   Wound infection after surgery Active Problems:   Hypothyroidism   Type 2 diabetes mellitus with stage 3 chronic kidney disease, with long-term current use of insulin (HCC)   Normocytic anemia   AKI (acute kidney injury) ([place])   Obesity (BMI 30-39.9)   Sepsis ([place])  #1 sepsis due to infected panniculectomy with cellulitis of an infected drainage of the incision patient was admitted with tachycardia and hypotension Resolving Continue antibiotics Surgery plans on possibly doing debridement on Monday, August 22.  Continue wound care  2.  Anemia.  Patient received packed RBC hemoglobin is 7.1 same as yesterday it has not improved I will transfuse 1 unit today  3.  Essential hypertension blood pressure is on the soft low side we will continue home remedies and hold if needed for low blood pressure  4.  Hypothyroidism continue Synthroid  5.  Diabetes mellitus type 2 last A1c was 6.4 on July 26 continue home meds remedy with Tresiba nightly and Ozempic weekly  6.  Acute kidney injury patient  presented with creatinine of 1.  3 9 Continue IV fluid rehydration Code Status: Full  Severity of Illness: The appropriate patient status for this patient is INPATIENT. Inpatient status is judged to be reasonable and necessary in order to provide the required intensity of service to ensure the patient's safety. The patient's presenting symptoms, physical exam findings, and initial radiographic and laboratory data in the context of their chronic comorbidities is felt to place them at high risk for further clinical deterioration. Furthermore, it is not anticipated that the patient will be medically stable for discharge from the hospital within 2 midnights of admission. The following factors support the patient status of inpatient.   " Needs IV antibiotics infected wound might need debridement  * I certify that at the point of admission it is my clinical judgment that the patient will require inpatient hospital care spanning beyond 2 midnights from the point of admission due to high intensity of service, high risk for further deterioration and high frequency of surveillance required.*   Family Communication: Patient  Disposition Plan: Likely home Status is: Inpatient   Dispo: The patient is from: Home              Anticipated d/c is to:               Anticipated d/c date is:               Patient currently not medically stable for discharge  Consultants: Plastic surgery  Procedures: None  Antimicrobials: Clindamycin Ceftriaxone Vancomycin  DVT prophylaxis: SCD   Objective: Vitals:  10/13/20 1656 10/13/20 2138 10/14/20 0640 10/14/20 0830  BP: (!) 126/52 (!) 119/52 (!) 111/51 (!) 128/51  Pulse: 76 75 73 78  Resp: '18 17 15 18  '$ Temp: 98.2 F (36.8 C) 98.7 F (37.1 C) 98.4 F (36.9 C) 98.9 F (37.2 C)  TempSrc: Oral Oral Oral Oral  SpO2: 99% 99% 100% 99%  Weight:      Height:        Intake/Output Summary (Last 24 hours) at 10/14/2020 0931 Last data filed at 10/14/2020  0745 Gross per 24 hour  Intake 880 ml  Output 345 ml  Net 535 ml    Filed Weights   10/12/20 1145  Weight: 97.1 kg   Body mass index is 35.61 kg/m.  Exam:  General: 75 y.o. year-old female well developed well nourished in no acute distress.  Alert and oriented x3. Cardiovascular: Regular rate and rhythm with no rubs or gallops.  No thyromegaly or JVD noted.   Respiratory: Clear to auscultation with no wheezes or rales. Good inspiratory effort. Abdomen: Soft, tender right lower quadrant and suprapubic area nondistended with normal bowel sounds x4 quadrants.  There is a right JP drain Infected surgical wound areas around the umbilicus Musculoskeletal: No lower extremity edema. 2/4 pulses in all 4 extremities. Skin: No ulcerative lesions noted or rashes, Psychiatry: Mood is appropriate for condition and setting Neurology:    Data Reviewed: CBC: Recent Labs  Lab 10/12/20 1205 10/12/20 2131 10/13/20 0120 10/14/20 0116  WBC 4.5  --  5.5 6.1  NEUTROABS 2.2  --   --   --   HGB 7.3* 7.5* 7.1* 7.1*  HCT 22.7* 23.0* 20.9* 21.8*  MCV 87.6  --  84.6 85.2  PLT 287  --  228 [id number]    Basic Metabolic Panel: Recent Labs  Lab 10/12/20 1205 10/13/20 0120 10/14/20 0116  NA 136 133* 136  K 4.1 3.7 3.8  CL 105 104 106  CO2 22 22 20*  GLUCOSE 189* 106* 121*  BUN 16 17 24*  CREATININE 1.39* 1.43* 1.54*  CALCIUM 8.3* 7.8* 8.1*    GFR: Estimated Creatinine Clearance: 35.8 mL/min (A) (by C-G formula based on SCr of 1.54 mg/dL (H)). Liver Function Tests: Recent Labs  Lab 10/12/20 1205  AST 21  ALT 13  ALKPHOS 64  BILITOT 0.8  PROT 6.0*  ALBUMIN 2.4*    No results for input(s): LIPASE, AMYLASE in the last 168 hours. No results for input(s): AMMONIA in the last 168 hours. Coagulation Profile: No results for input(s): INR, PROTIME in the last 168 hours. Cardiac Enzymes: No results for input(s): CKTOTAL, CKMB, CKMBINDEX, TROPONINI in the last 168 hours. BNP (last 3  results) No results for input(s): PROBNP in the last 8760 hours. HbA1C: No results for input(s): HGBA1C in the last 72 hours. CBG: Recent Labs  Lab 10/13/20 0737 10/13/20 1313 10/13/20 1620 10/13/20 2141 10/14/20 0827  GLUCAP 88 122* 134* 116* 139*    Lipid Profile: No results for input(s): CHOL, HDL, LDLCALC, TRIG, CHOLHDL, LDLDIRECT in the last 72 hours. Thyroid Function Tests: Recent Labs    10/12/20 2131  TSH 4.574*    Anemia Panel: No results for input(s): VITAMINB12, FOLATE, FERRITIN, TIBC, IRON, RETICCTPCT in the last 72 hours. Urine analysis:    Component Value Date/Time   COLORURINE YELLOW 10/12/2020 [place] 10/12/2020 1653   LABSPEC 1.011 10/12/2020 1653   PHURINE 5.0 10/12/2020 [place] 10/12/2020 1653   HGBUR NEGATIVE 10/12/2020  [place] 10/12/2020 1653   BILIRUBINUR negative 05/31/2020 1151   KETONESUR 5 (A) 10/12/2020 1653   PROTEINUR NEGATIVE 10/12/2020 1653   UROBILINOGEN 0.2 05/31/2020 1151   UROBILINOGEN 0.2 10/14/2012 1009   NITRITE NEGATIVE 10/12/2020 [place] 10/12/2020 1653   Sepsis Labs: '@LABRCNTIP'$ (procalcitonin:4,lacticidven:4)  ) Recent Results (from the past 240 hour(s))  Resp Panel by RT-PCR (Flu A&B, Covid) Nasopharyngeal Swab     Status: None   Collection Time: 10/12/20 12:05 PM   Specimen: Nasopharyngeal Swab; Nasopharyngeal(NP) swabs in vial transport medium  Result Value Ref Range Status   SARS Coronavirus 2 by RT PCR NEGATIVE NEGATIVE Final    Comment: (NOTE) SARS-CoV-2 target nucleic acids are NOT DETECTED.  The SARS-CoV-2 RNA is generally detectable in upper respiratory specimens during the acute phase of infection. The lowest concentration of SARS-CoV-2 viral copies this assay can detect is 138 copies/mL. A negative result does not preclude SARS-Cov-2 infection and should not be used as the sole basis for treatment or other patient management  decisions. A negative result may occur with  improper specimen collection/handling, submission of specimen other than nasopharyngeal swab, presence of viral mutation(s) within the areas targeted by this assay, and inadequate number of viral copies(<138 copies/mL). A negative result must be combined with clinical observations, patient history, and epidemiological information. The expected result is Negative.  Fact Sheet for Patients:  EntrepreneurPulse.com.au  Fact Sheet for Healthcare Providers:  IncredibleEmployment.be  This test is no t yet approved or cleared by the [place] FDA and  has been authorized for detection and/or diagnosis of SARS-CoV-2 by FDA under an Emergency Use Authorization (EUA). This EUA will remain  in effect (meaning this test can be used) for the duration of the COVID-19 declaration under Section 564(b)(1) of the Act, 21 U.S.C.section 360bbb-3(b)(1), unless the authorization is terminated  or revoked sooner.       Influenza A by PCR NEGATIVE NEGATIVE Final   Influenza B by PCR NEGATIVE NEGATIVE Final    Comment: (NOTE) The Xpert Xpress SARS-CoV-2/FLU/RSV plus assay is intended as an aid in the diagnosis of influenza from Nasopharyngeal swab specimens and should not be used as a sole basis for treatment. Nasal washings and aspirates are unacceptable for Xpert Xpress SARS-CoV-2/FLU/RSV testing.  Fact Sheet for Patients: EntrepreneurPulse.com.au  Fact Sheet for Healthcare Providers: IncredibleEmployment.be  This test is not yet approved or cleared by the [place] FDA and has been authorized for detection and/or diagnosis of SARS-CoV-2 by FDA under an Emergency Use Authorization (EUA). This EUA will remain in effect (meaning this test can be used) for the duration of the COVID-19 declaration under Section 564(b)(1) of the Act, 21 U.S.C. section 360bbb-3(b)(1), unless the  authorization is terminated or revoked.  Performed at [facility], [place] [address] [place], [place] [address]   Blood Culture (routine x 2)     Status: None (Preliminary result)   Collection Time: 10/12/20 12:05 PM   Specimen: BLOOD  Result Value Ref Range Status   Specimen Description BLOOD SITE NOT SPECIFIED  Final   Special Requests   Final    BOTTLES DRAWN AEROBIC AND ANAEROBIC Blood Culture results may not be optimal due to an inadequate volume of blood received in culture bottles   Culture   Final    NO GROWTH < 24 HOURS Performed at [facility], [place] [address], [place], [place] [address]    Report Status PENDING  Incomplete  Blood Culture (routine x 2)     Status: None (Preliminary result)   Collection Time: 10/12/20 12:05 PM   Specimen: BLOOD  Result Value Ref Range Status   Specimen Description BLOOD SITE NOT SPECIFIED  Final   Special Requests   Final    BOTTLES DRAWN AEROBIC AND ANAEROBIC Blood Culture results may not be optimal due to an inadequate volume of blood received in culture bottles   Culture   Final    NO GROWTH < 24 HOURS Performed at [facility], [address] [place], [place] [address]    Report Status PENDING  Incomplete      Studies: No results found.  Scheduled Meds:  amLODipine  5 mg Oral Daily   gabapentin  600 mg Oral QHS   insulin aspart  0-5 Units Subcutaneous QHS   insulin aspart  0-9 Units Subcutaneous TID WC   insulin glargine-yfgn  6 Units Subcutaneous QHS   [START ON 10/15/2020] levothyroxine  12.5 mcg Oral Once per day on Mon Tue Wed Thu Fri   levothyroxine  25 mcg Oral Once per day on Sun Sat   losartan  100 mg Oral Daily   metoprolol succinate  25 mg Oral QHS    Continuous Infusions:  cefTRIAXone (ROCEPHIN)  IV Stopped (10/13/20 1740)   clindamycin (CLEOCIN) IV Stopped (10/14/20 0044)   vancomycin Stopped (10/13/20 1442)     LOS: 2 days     Cristal Deer, MD [facility]  To reach me or  the doctor on call, go to: [email or web address] Password [email or web address]  10/14/2020, 9:31 AM

## 2020-10-14 NOTE — Plan of Care (Signed)
  Problem: Pain Managment: Goal: General experience of comfort will improve Outcome: Progressing   

## 2020-10-14 NOTE — Progress Notes (Addendum)
[name], female   DOB: 01-11-1946, 75 y.o.   MRN: [id number]  I spoke with patient on the phone.  She is agreeable for surgery tomorrow.  The plan will be for debridement of open incision.  Patient had dehiscence of wound and there is presence of some eschar at the skin edges.  Infection is not clear at this time.  Hoen count is normal.  Appears to be more of a dehiscence than infection at this time.  Patient is requesting rehab prior to discharging home.  I think that that is a very good idea if we can make those arrangements.  Wound care should be straightforward and dressing changes once a day.

## 2020-10-15 ENCOUNTER — Ambulatory Visit: Payer: Self-pay

## 2020-10-15 ENCOUNTER — Encounter (HOSPITAL_COMMUNITY)
Admission: EM | Disposition: A | Discharge status: Skilled Nursing Facility | Payer: Self-pay | Source: Ambulatory Visit | Attending: Internal Medicine

## 2020-10-15 ENCOUNTER — Inpatient Hospital Stay (HOSPITAL_COMMUNITY): Payer: Medicare HMO | Admitting: Anesthesiology

## 2020-10-15 ENCOUNTER — Telehealth: Payer: Medicare HMO

## 2020-10-15 ENCOUNTER — Encounter (HOSPITAL_COMMUNITY): Payer: Self-pay | Admitting: Internal Medicine

## 2020-10-15 DIAGNOSIS — E1122 Type 2 diabetes mellitus with diabetic chronic kidney disease: Secondary | ICD-10-CM

## 2020-10-15 DIAGNOSIS — N182 Chronic kidney disease, stage 2 (mild): Secondary | ICD-10-CM

## 2020-10-15 DIAGNOSIS — T8149XA Infection following a procedure, other surgical site, initial encounter: Secondary | ICD-10-CM

## 2020-10-15 DIAGNOSIS — F419 Anxiety disorder, unspecified: Secondary | ICD-10-CM

## 2020-10-15 DIAGNOSIS — N183 Chronic kidney disease, stage 3 unspecified: Secondary | ICD-10-CM

## 2020-10-15 DIAGNOSIS — I129 Hypertensive chronic kidney disease with stage 1 through stage 4 chronic kidney disease, or unspecified chronic kidney disease: Secondary | ICD-10-CM

## 2020-10-15 DIAGNOSIS — E66812 Obesity, class 2: Secondary | ICD-10-CM

## 2020-10-15 DIAGNOSIS — M793 Panniculitis, unspecified: Secondary | ICD-10-CM

## 2020-10-15 DIAGNOSIS — Z794 Long term (current) use of insulin: Secondary | ICD-10-CM

## 2020-10-15 DIAGNOSIS — Z6836 Body mass index (BMI) 36.0-36.9, adult: Secondary | ICD-10-CM

## 2020-10-15 HISTORY — PX: INCISION AND DRAINAGE OF WOUND: SHX1803

## 2020-10-15 LAB — BASIC METABOLIC PANEL
Anion gap: 8 (ref 5–15)
BUN: 22 mg/dL (ref 8–23)
CO2: 23 mmol/L (ref 22–32)
Calcium: 8.3 mg/dL — ABNORMAL LOW (ref 8.9–10.3)
Chloride: 106 mmol/L (ref 98–111)
Creatinine, Ser: 1.27 mg/dL — ABNORMAL HIGH (ref 0.44–1.00)
GFR, Estimated: 44 mL/min — ABNORMAL LOW (ref >60)
Glucose, Bld: 115 mg/dL — ABNORMAL HIGH (ref 70–99)
Potassium: 3.7 mmol/L (ref 3.5–5.1)
Sodium: 137 mmol/L (ref 135–145)

## 2020-10-15 LAB — TYPE AND SCREEN
ABO/RH(D): A POS
Antibody Screen: NEGATIVE
Unit division: 0
Unit division: 0

## 2020-10-15 LAB — GLUCOSE, CAPILLARY
Glucose-Capillary: 108 mg/dL — ABNORMAL HIGH (ref 70–99)
Glucose-Capillary: 95 mg/dL (ref 70–99)
Glucose-Capillary: 172 mg/dL — ABNORMAL HIGH (ref 70–99)
Glucose-Capillary: 265 mg/dL — ABNORMAL HIGH (ref 70–99)

## 2020-10-15 LAB — BPAM RBC
Blood Product Expiration Date: 202208282359
Blood Product Expiration Date: 202209102359
ISSUE DATE / TIME: 202208191439
ISSUE DATE / TIME: 202208211646
Unit Type and Rh: 6200
Unit Type and Rh: 6200

## 2020-10-15 LAB — HEMOGLOBIN AND HEMATOCRIT, BLOOD
HCT: 23.9 % — ABNORMAL LOW (ref 36.0–46.0)
Hemoglobin: 8.1 g/dL — ABNORMAL LOW (ref 12.0–15.0)

## 2020-10-15 SURGERY — IRRIGATION AND DEBRIDEMENT WOUND
Anesthesia: General | Site: Abdomen

## 2020-10-15 MED ORDER — ONDANSETRON HCL 4 MG/2ML IJ SOLN
INTRAMUSCULAR | Status: DC | PRN
  Administered 2020-10-15: 4 mg via INTRAVENOUS

## 2020-10-15 MED ORDER — FLUMAZENIL 0.5 MG/5ML IV SOLN
INTRAVENOUS | Status: DC | PRN
  Administered 2020-10-15: .2 mg via INTRAVENOUS

## 2020-10-15 MED ORDER — SUGAMMADEX SODIUM 200 MG/2ML IV SOLN
INTRAVENOUS | Status: DC | PRN
  Administered 2020-10-15 (×2): 100 mg via INTRAVENOUS
  Administered 2020-10-15: 200 mg via INTRAVENOUS

## 2020-10-15 MED ORDER — 0.9 % SODIUM CHLORIDE (POUR BTL) OPTIME
TOPICAL | Status: DC | PRN
  Administered 2020-10-15: 1000 mL

## 2020-10-15 MED ORDER — PROPOFOL 10 MG/ML IV BOLUS
INTRAVENOUS | Status: DC | PRN
  Administered 2020-10-15: 150 mg via INTRAVENOUS

## 2020-10-15 MED ORDER — ROCURONIUM BROMIDE 10 MG/ML (PF) SYRINGE
PREFILLED_SYRINGE | INTRAVENOUS | Status: AC
  Filled 2020-10-15: qty 10

## 2020-10-15 MED ORDER — LIDOCAINE 2% (20 MG/ML) 5 ML SYRINGE
INTRAMUSCULAR | Status: AC
  Filled 2020-10-15: qty 5

## 2020-10-15 MED ORDER — CHLORHEXIDINE GLUCONATE 0.12 % MT SOLN
OROMUCOSAL | Status: AC
  Administered 2020-10-15: 15 mL
  Filled 2020-10-15: qty 15

## 2020-10-15 MED ORDER — SODIUM CHLORIDE 0.9 % IV SOLN: INTRAVENOUS | Status: DC | PRN

## 2020-10-15 MED ORDER — PROPOFOL 10 MG/ML IV BOLUS
INTRAVENOUS | Status: AC
  Filled 2020-10-15: qty 20

## 2020-10-15 MED ORDER — CHLORHEXIDINE GLUCONATE CLOTH 2 % EX PADS
6.0000 | MEDICATED_PAD | Freq: Every day | CUTANEOUS | Status: DC
  Administered 2020-10-15 – 2020-10-17 (×3): 6 via TOPICAL

## 2020-10-15 MED ORDER — FENTANYL CITRATE (PF) 250 MCG/5ML IJ SOLN
INTRAMUSCULAR | Status: AC
  Filled 2020-10-15: qty 5

## 2020-10-15 MED ORDER — CEFAZOLIN SODIUM-DEXTROSE 2-4 GM/100ML-% IV SOLN
2.0000 g | INTRAVENOUS | Status: AC
  Administered 2020-10-15: 2 g via INTRAVENOUS
  Filled 2020-10-15: qty 100

## 2020-10-15 MED ORDER — MIDAZOLAM HCL 2 MG/2ML IJ SOLN
INTRAMUSCULAR | Status: AC
  Filled 2020-10-15: qty 2

## 2020-10-15 MED ORDER — LIDOCAINE-EPINEPHRINE 1 %-1:100000 IJ SOLN
INTRAMUSCULAR | Status: AC
  Filled 2020-10-15: qty 1

## 2020-10-15 MED ORDER — FENTANYL CITRATE (PF) 250 MCG/5ML IJ SOLN
INTRAMUSCULAR | Status: DC | PRN
  Administered 2020-10-15 (×2): 50 ug via INTRAVENOUS

## 2020-10-15 MED ORDER — ALBUMIN HUMAN 5 % IV SOLN: INTRAVENOUS | Status: DC | PRN

## 2020-10-15 MED ORDER — ALBUTEROL SULFATE HFA 108 (90 BASE) MCG/ACT IN AERS
INHALATION_SPRAY | RESPIRATORY_TRACT | Status: DC | PRN
  Administered 2020-10-15 (×4): 2 via RESPIRATORY_TRACT

## 2020-10-15 MED ORDER — MIDAZOLAM HCL 2 MG/2ML IJ SOLN
INTRAMUSCULAR | Status: DC | PRN
  Administered 2020-10-15 (×2): 1 mg via INTRAVENOUS

## 2020-10-15 MED ORDER — DEXAMETHASONE SODIUM PHOSPHATE 10 MG/ML IJ SOLN
INTRAMUSCULAR | Status: DC | PRN
  Administered 2020-10-15: 10 mg via INTRAVENOUS

## 2020-10-15 MED ORDER — LIDOCAINE 2% (20 MG/ML) 5 ML SYRINGE
INTRAMUSCULAR | Status: DC | PRN
  Administered 2020-10-15: 60 mg via INTRAVENOUS

## 2020-10-15 MED ORDER — DEXAMETHASONE SODIUM PHOSPHATE 10 MG/ML IJ SOLN
INTRAMUSCULAR | Status: AC
  Filled 2020-10-15: qty 1

## 2020-10-15 MED ORDER — ROCURONIUM BROMIDE 10 MG/ML (PF) SYRINGE
PREFILLED_SYRINGE | INTRAVENOUS | Status: DC | PRN
  Administered 2020-10-15: 50 mg via INTRAVENOUS

## 2020-10-15 MED ORDER — NALOXONE HCL 0.4 MG/ML IJ SOLN
INTRAMUSCULAR | Status: DC | PRN
  Administered 2020-10-15 (×3): 40 ug via INTRAVENOUS

## 2020-10-15 MED ORDER — ONDANSETRON HCL 4 MG/2ML IJ SOLN
INTRAMUSCULAR | Status: AC
  Filled 2020-10-15: qty 2

## 2020-10-15 MED ORDER — FLUMAZENIL 0.5 MG/5ML IV SOLN
INTRAVENOUS | Status: AC
  Filled 2020-10-15: qty 5

## 2020-10-15 MED ORDER — LACTATED RINGERS IV SOLN: INTRAVENOUS | Status: DC | PRN

## 2020-10-15 SURGICAL SUPPLY — 53 items
APPLICATOR COTTON TIP 6 STRL (MISCELLANEOUS) IMPLANT
APPLICATOR COTTON TIP 6IN STRL (MISCELLANEOUS) IMPLANT
BAG COUNTER SPONGE SURGICOUNT (BAG) ×2 IMPLANT
BAG DECANTER FOR FLEXI CONT (MISCELLANEOUS) IMPLANT
BENZOIN TINCTURE PRP APPL 2/3 (GAUZE/BANDAGES/DRESSINGS) ×2 IMPLANT
CANISTER SUCT 3000ML PPV (MISCELLANEOUS) ×2 IMPLANT
CNTNR URN SCR LID CUP LEK RST (MISCELLANEOUS) IMPLANT
CONT SPEC 4OZ STRL OR WHT (MISCELLANEOUS)
COVER SURGICAL LIGHT HANDLE (MISCELLANEOUS) ×2 IMPLANT
DRAIN CHANNEL 19F RND (DRAIN) IMPLANT
DRAIN JP 10F RND SILICONE (MISCELLANEOUS) IMPLANT
DRAPE DERMATAC (DRAPES) ×2 IMPLANT
DRAPE HALF SHEET 40X57 (DRAPES) IMPLANT
DRAPE IMP U-DRAPE 54X76 (DRAPES) ×2 IMPLANT
DRAPE INCISE IOBAN 66X45 STRL (DRAPES) IMPLANT
DRAPE LAPAROSCOPIC ABDOMINAL (DRAPES) IMPLANT
DRAPE LAPAROTOMY 100X72 PEDS (DRAPES) ×2 IMPLANT
DRSG ADAPTIC 3X8 NADH LF (GAUZE/BANDAGES/DRESSINGS) IMPLANT
DRSG PAD ABDOMINAL 8X10 ST (GAUZE/BANDAGES/DRESSINGS) IMPLANT
DRSG VAC ATS LRG SENSATRAC (GAUZE/BANDAGES/DRESSINGS) IMPLANT
DRSG VAC ATS MED SENSATRAC (GAUZE/BANDAGES/DRESSINGS) IMPLANT
DRSG VAC ATS SM SENSATRAC (GAUZE/BANDAGES/DRESSINGS) IMPLANT
ELECT CAUTERY BLADE 6.4 (BLADE) IMPLANT
ELECT REM PT RETURN 9FT ADLT (ELECTROSURGICAL) ×2
ELECTRODE REM PT RTRN 9FT ADLT (ELECTROSURGICAL) ×1 IMPLANT
GAUZE SPONGE 4X4 12PLY STRL (GAUZE/BANDAGES/DRESSINGS) IMPLANT
GLOVE SURG ENC MOIS LTX SZ6.5 (GLOVE) ×2 IMPLANT
GLOVE SURG ENC TEXT LTX SZ6.5 (GLOVE) ×2 IMPLANT
GOWN STRL REUS W/ TWL LRG LVL3 (GOWN DISPOSABLE) ×3 IMPLANT
GOWN STRL REUS W/TWL LRG LVL3 (GOWN DISPOSABLE) ×3
KIT BASIN OR (CUSTOM PROCEDURE TRAY) ×2 IMPLANT
KIT TURNOVER KIT B (KITS) ×2 IMPLANT
MICROMATRIX 1000MG (Tissue) ×4 IMPLANT
NDL HYPO 25GX1X1/2 BEV (NEEDLE) ×1 IMPLANT
NEEDLE HYPO 25GX1X1/2 BEV (NEEDLE) ×2 IMPLANT
NS IRRIG 1000ML POUR BTL (IV SOLUTION) ×2 IMPLANT
PACK GENERAL/GYN (CUSTOM PROCEDURE TRAY) ×2 IMPLANT
PACK UNIVERSAL I (CUSTOM PROCEDURE TRAY) ×2 IMPLANT
PAD ARMBOARD 7.5X6 YLW CONV (MISCELLANEOUS) ×4 IMPLANT
SOLUTION PARTIC MCRMTRX 1000MG (Tissue) IMPLANT
STAPLER VISISTAT 35W (STAPLE) ×2 IMPLANT
SURGILUBE 2OZ TUBE FLIPTOP (MISCELLANEOUS) IMPLANT
SUT MNCRL AB 3-0 PS2 27 (SUTURE) IMPLANT
SUT MNCRL AB 4-0 PS2 18 (SUTURE) IMPLANT
SUT MON AB 2-0 CT1 36 (SUTURE) IMPLANT
SUT MON AB 5-0 PS2 18 (SUTURE) IMPLANT
SUT VIC AB 5-0 PS2 18 (SUTURE) IMPLANT
SUT VICRYL 3 0 (SUTURE) IMPLANT
SWAB COLLECTION DEVICE MRSA (MISCELLANEOUS) IMPLANT
SWAB CULTURE ESWAB REG 1ML (MISCELLANEOUS) IMPLANT
SYR CONTROL 10ML LL (SYRINGE) ×2 IMPLANT
TOWEL GREEN STERILE (TOWEL DISPOSABLE) ×2 IMPLANT
UNDERPAD 30X36 HEAVY ABSORB (UNDERPADS AND DIAPERS) ×2 IMPLANT

## 2020-10-15 NOTE — Patient Instructions (Signed)
Goals Addressed      Complete Panniculectomy   On track    Timeframe:  Long-Range Goal Priority:  Medium Start Date: 02/29/20                            Expected End Date: 12/24/20  Next Follow up date: 11/05/20  Patient Self-Care Activities:  Keep the following appointments related to pre-op and post-op Panniculectomy 09/04/20-Pre-Op visit for Panniculectomy procedure (Dr. Marla Roe) 09/24/20-Lab visit for Panniculectomy procedure 09/26/20-Panniculectomy outpatient/inpatient procedure  09/30/20-Post-Op Panniculectomy follow up appointment with Dr. Marla Roe  10/12/20-Post-Op Panniculectomy follow up appointments with Roetta Sessions, PA-C

## 2020-10-15 NOTE — Progress Notes (Signed)
PROGRESS NOTE   AVANA JANIAK  [id number]    DOB: [birth year]    DOA: 10/12/2020  PCP: Glendale Chard, MD   I have briefly reviewed patients previous medical records in [facility].  Chief Complaint  Patient presents with   Wound Infection    Brief Narrative:  75 year old female with medical history significant for hypertension, DM2, hypothyroidism and obesity presents with suspected postop wound infection.  She had elective panniculectomy performed on 8/3 by Dr. Tedra Coupe him, plastic surgery and was found to have a supraumbilical hernia which was repaired by Dr. Quinn Plowman of general surgery.  She now presented with subjective fevers, chills and malaise and 3 to 400 mL output per day from the drain of brownish-yellow fluid.  She had gone to the plastic surgery office for routine follow-up and was advised to come to the hospital for further evaluation.  In the ED febrile up to 100.6 F.  Started empirically on antibiotics.  Plastic surgery was consulted, suspected more of her dehiscence than infection and underwent debridement of open incision in OR on 8/22.   Assessment & Plan:  Principal Problem:   Wound infection after surgery Active Problems:   Hypothyroidism   Type 2 diabetes mellitus with stage 3 chronic kidney disease, with long-term current use of insulin (HCC)   Normocytic anemia   AKI (acute kidney injury) ([place])   Obesity (BMI 30-39.9)   Sepsis ([place])   Sepsis secondary to postoperative wound infection: S/p panniculectomy on 8/3 by Dr. Marla Roe.  Febrile up to 101.3 with tachycardia and tachypnea.  WBC 4.5 with left shift but lactate normal.  CT abdomen and pelvis significant for postsurgical changes with concern for subcutaneous gas to rule out potential necrotizing fasciitis.  Started empirically on IV vancomycin, Rocephin and clindamycin.  Plastic surgery was consulted and underwent debridement in OR on 8/23.  Anemia: S/p 2 units PRBC transfusion thus far this  admission.  Hemoglobin up to 8.1.  Follow CBC daily and transfuse if hemoglobin 7 g or less.  Essential hypertension: Controlled.  Continue home dose of amlodipine, losartan and Toprol-XL.  Hypothyroidism: Continue Synthroid.  Type II DM: A1c 6.4 on 7/26.  Continue with SSI, glargine insulin.  CBGs reasonably controlled here.  Acute kidney injury: Improved.  Follow BMP daily.  Body mass index is 35.61 kg/m.   DVT prophylaxis: SCDs Start: 10/12/20 1602     Code Status: Full Code Family Communication: None at bedside. Disposition:  Status is: Inpatient  Remains inpatient appropriate because:Inpatient level of care appropriate due to severity of illness  Dispo: The patient is from: Home              Anticipated d/c is to: SNF              Patient currently is not medically stable to d/c.   Difficult to place patient No        Consultants:   Plastic surgery  Procedures:   Debridement of postop abdominal wound in OR 8/22.  Antimicrobials:    Anti-infectives (From admission, onward)    Start     Dose/Rate Route Frequency Ordered Stop   10/15/20 1255  ceFAZolin 1 g / gentamicin 80 mg in NS 500 mL surgical irrigation  Status:  Discontinued          As needed 10/15/20 1255 10/15/20 1441   10/15/20 0745  ceFAZolin (ANCEF) IVPB 2g/100 mL premix        2 g 200 mL/hr over  30 Minutes Intravenous On call to O.R. 10/15/20 [id number] 10/15/20 1325   10/13/20 1600  cefTRIAXone (ROCEPHIN) 2 g in sodium chloride 0.9 % 100 mL IVPB        2 g 200 mL/hr over 30 Minutes Intravenous Every 24 hours 10/12/20 1606     10/13/20 1400  vancomycin (VANCOREADY) IVPB 750 mg/150 mL        750 mg 150 mL/hr over 60 Minutes Intravenous Every 24 hours 10/12/20 1314     10/13/20 0000  clindamycin (CLEOCIN) IVPB 900 mg        900 mg 100 mL/hr over 30 Minutes Intravenous Every 8 hours 10/12/20 1626     10/12/20 2200  clindamycin (CLEOCIN) IVPB 600 mg  Status:  Discontinued        600 mg 100 mL/hr over 30  Minutes Intravenous Every 8 hours 10/12/20 1606 10/12/20 1626   10/12/20 1530  clindamycin (CLEOCIN) IVPB 600 mg        600 mg 100 mL/hr over 30 Minutes Intravenous  Once 10/12/20 1519 10/12/20 1935   10/12/20 1200  vancomycin (VANCOREADY) IVPB 1500 mg/300 mL        1,500 mg 150 mL/hr over 120 Minutes Intravenous  Once 10/12/20 1153 10/12/20 1743   10/12/20 1200  cefTRIAXone (ROCEPHIN) 2 g in sodium chloride 0.9 % 100 mL IVPB        2 g 200 mL/hr over 30 Minutes Intravenous  Once 10/12/20 1153 10/12/20 1253         Subjective:  Seen this morning prior to procedure.  Patient was apprehensive about the procedure itself but did not report much complaints.  No significant abdominal pain.  Reassured her  Objective:   Vitals:   10/15/20 1445 10/15/20 1451 10/15/20 1500 10/15/20 1510  BP: (!) 132/58  (!) 131/52   Pulse: 95  95 93  Resp: (!) 24  (!) 22 (!) 22  Temp: 97.6 F (36.4 C)   (!) 97.3 F (36.3 C)  TempSrc:      SpO2: 100% 98% 95% 96%  Weight:      Height:        General exam: Elderly female, moderately built and obese lying comfortably propped up in bed without distress. Respiratory system: Clear to auscultation. Respiratory effort normal. Cardiovascular system: S1 & S2 heard, RRR. No JVD, murmurs, rubs, gallops or clicks. No pedal edema.  Not on telemetry. Gastrointestinal system: Abdomen is nondistended, soft and nontender. No organomegaly or masses felt. Normal bowel sounds heard.  Extensive dressing across lower abdomen, clean and dry.  Did not remove dressing since she was going to the OR shortly. Central nervous system: Alert and oriented. No focal neurological deficits. Extremities: Symmetric 5 x 5 power. Skin: No rashes, lesions or ulcers Psychiatry: Judgement and insight appear normal. Mood & affect appropriate.     Data Reviewed:   I have personally reviewed following labs and imaging studies   CBC: Recent Labs  Lab 10/12/20 1205 10/12/20 2131  10/13/20 0120 10/14/20 0116 10/15/20 0038  WBC 4.5  --  5.5 6.1  --   NEUTROABS 2.2  --   --   --   --   HGB 7.3*   < > 7.1* 7.1* 8.1*  HCT 22.7*   < > 20.9* 21.8* 23.9*  MCV 87.6  --  84.6 85.2  --   PLT 287  --  228 241  --    < > = values in this interval not displayed.  Basic Metabolic Panel: Recent Labs  Lab 10/13/20 0120 10/14/20 0116 10/15/20 0728  NA 133* 136 137  K 3.7 3.8 3.7  CL 104 106 106  CO2 22 20* 23  GLUCOSE 106* 121* 115*  BUN 17 24* 22  CREATININE 1.43* 1.54* 1.27*  CALCIUM 7.8* 8.1* 8.3*    Liver Function Tests: Recent Labs  Lab 10/12/20 1205  AST 21  ALT 13  ALKPHOS 64  BILITOT 0.8  PROT 6.0*  ALBUMIN 2.4*    CBG: Recent Labs  Lab 10/15/20 0735 10/15/20 1048 10/15/20 1828  GLUCAP 108* 95 172*    Microbiology Studies:   Recent Results (from the past 240 hour(s))  Resp Panel by RT-PCR (Flu A&B, Covid) Nasopharyngeal Swab     Status: None   Collection Time: 10/12/20 12:05 PM   Specimen: Nasopharyngeal Swab; Nasopharyngeal(NP) swabs in vial transport medium  Result Value Ref Range Status   SARS Coronavirus 2 by RT PCR NEGATIVE NEGATIVE Final    Comment: (NOTE) SARS-CoV-2 target nucleic acids are NOT DETECTED.  The SARS-CoV-2 RNA is generally detectable in upper respiratory specimens during the acute phase of infection. The lowest concentration of SARS-CoV-2 viral copies this assay can detect is 138 copies/mL. A negative result does not preclude SARS-Cov-2 infection and should not be used as the sole basis for treatment or other patient management decisions. A negative result may occur with  improper specimen collection/handling, submission of specimen other than nasopharyngeal swab, presence of viral mutation(s) within the areas targeted by this assay, and inadequate number of viral copies(<138 copies/mL). A negative result must be combined with clinical observations, patient history, and epidemiological information. The  expected result is Negative.  Fact Sheet for Patients:  EntrepreneurPulse.com.au  Fact Sheet for Healthcare Providers:  IncredibleEmployment.be  This test is no t yet approved or cleared by the [place] FDA and  has been authorized for detection and/or diagnosis of SARS-CoV-2 by FDA under an Emergency Use Authorization (EUA). This EUA will remain  in effect (meaning this test can be used) for the duration of the COVID-19 declaration under Section 564(b)(1) of the Act, 21 U.S.C.section 360bbb-3(b)(1), unless the authorization is terminated  or revoked sooner.       Influenza A by PCR NEGATIVE NEGATIVE Final   Influenza B by PCR NEGATIVE NEGATIVE Final    Comment: (NOTE) The Xpert Xpress SARS-CoV-2/FLU/RSV plus assay is intended as an aid in the diagnosis of influenza from Nasopharyngeal swab specimens and should not be used as a sole basis for treatment. Nasal washings and aspirates are unacceptable for Xpert Xpress SARS-CoV-2/FLU/RSV testing.  Fact Sheet for Patients: EntrepreneurPulse.com.au  Fact Sheet for Healthcare Providers: IncredibleEmployment.be  This test is not yet approved or cleared by the [place] FDA and has been authorized for detection and/or diagnosis of SARS-CoV-2 by FDA under an Emergency Use Authorization (EUA). This EUA will remain in effect (meaning this test can be used) for the duration of the COVID-19 declaration under Section 564(b)(1) of the Act, 21 U.S.C. section 360bbb-3(b)(1), unless the authorization is terminated or revoked.  Performed at [facility], [place] [address] [place], [place] [address]   Blood Culture (routine x 2)     Status: None (Preliminary result)   Collection Time: 10/12/20 12:05 PM   Specimen: BLOOD  Result Value Ref Range Status   Specimen Description BLOOD SITE NOT SPECIFIED  Final   Special Requests   Final    BOTTLES DRAWN AEROBIC  AND ANAEROBIC Blood Culture  results may not be optimal due to an inadequate volume of blood received in culture bottles   Culture   Final    NO GROWTH 3 DAYS Performed at [facility], [place] [address] [place], [place] [address]    Report Status PENDING  Incomplete  Blood Culture (routine x 2)     Status: None (Preliminary result)   Collection Time: 10/12/20 12:05 PM   Specimen: BLOOD  Result Value Ref Range Status   Specimen Description BLOOD SITE NOT SPECIFIED  Final   Special Requests   Final    BOTTLES DRAWN AEROBIC AND ANAEROBIC Blood Culture results may not be optimal due to an inadequate volume of blood received in culture bottles   Culture   Final    NO GROWTH 3 DAYS Performed at [facility], [place] [address], [place], [place] [address]    Report Status PENDING  Incomplete  Surgical pcr screen     Status: Abnormal   Collection Time: 10/14/20  8:57 PM   Specimen: Nasal Mucosa; Nasal Swab  Result Value Ref Range Status   MRSA, PCR POSITIVE (A) NEGATIVE Final    Comment: RESULT CALLED TO, READ BACK BY AND VERIFIED WITH: C SISON RN 10/14/20 2306 JDW    Staphylococcus aureus POSITIVE (A) NEGATIVE Final    Comment: (NOTE) The Xpert SA Assay (FDA approved for NASAL specimens in patients 21 years of age and older), is one component of a comprehensive surveillance program. It is not intended to diagnose infection nor to guide or monitor treatment. Performed at [facility], [place] [address], [place], [place] [address]      Radiology Studies:  No results found.   Scheduled Meds:    amLODipine  5 mg Oral Daily   Chlorhexidine Gluconate Cloth  6 each Topical Daily   gabapentin  600 mg Oral QHS   insulin aspart  0-5 Units Subcutaneous QHS   insulin aspart  0-9 Units Subcutaneous TID WC   insulin glargine-yfgn  6 Units Subcutaneous QHS   levothyroxine  12.5 mcg Oral Once per day on Mon Tue Wed Thu Fri   levothyroxine  25 mcg Oral Once per day on Sun Sat    losartan  100 mg Oral Daily   metoprolol succinate  25 mg Oral QHS   mupirocin ointment  1 application Nasal BID    Continuous Infusions:    cefTRIAXone (ROCEPHIN)  IV Stopped (10/14/20 1831)   clindamycin (CLEOCIN) IV 900 mg (10/15/20 1822)   vancomycin 750 mg (10/15/20 1647)     LOS: 3 days     Vernell Leep, MD, FACP, [facility]. [facility]    To contact the attending provider between 7A-7P or the covering provider during after hours 7P-7A, please log into the web site [email or web address] and access using universal [place] password for that web site. If you do not have the password, please call the hospital operator.  10/15/2020, 6:35 PM

## 2020-10-15 NOTE — Op Note (Signed)
DATE OF OPERATION: 10/15/2020  LOCATION: [facility] Inpatient  PREOPERATIVE DIAGNOSIS: Abdominal incision dehiscence with wound  POSTOPERATIVE DIAGNOSIS: Same  PROCEDURE: Excision of abdominal incisional dehiscence 3 x 15 cm skin and soft tissue.  Placement of ACell 2 g, placement of VAC  SURGEON: Kenon Delashmit [employer]. [employer], DO  ASSISTANT: Krista Blue, PA  EBL: 5 cc  CONDITION: Stable  COMPLICATIONS: None  INDICATION: The patient, Teresa Golden, is a 75 y.o. female born on 07/15/1945, is here for treatment dehiscence of an abdominal incision after panniculectomy.   PROCEDURE DETAILS:  The patient was seen prior to surgery and marked.  The IV antibiotics were given. The patient was taken to the operating room and given a general anesthetic. A standard time out was performed and all information was confirmed by those in the room. SCDs were placed.   The abdomen was prepped and draped.  She had an area that was approximately 3 x 15 x 2 cm of the incision that had dehisced.  It had necrotic tissue.  The deep tissue looked reasonable and did not appear to be infected.  The #10 blade and tissue scissors were used to excise the wound of the 3 x 15 cm area.  It was washed out with copious amounts of saline and antibiotic solution.  ACell powder 2 g was applied followed by 2 and 3-0 PDS sutures to reapproximate the incision edges.  A VAC was then placed over the area.  There was an excellent seal. The patient was allowed to wake up and taken to recovery room in stable condition at the end of the case. The the patient did not have family for me to call her at the end of the case.   The advanced practice practitioner (APP) assisted throughout the case.  The APP was essential in retraction and counter traction when needed to make the case progress smoothly.  This retraction and assistance made it possible to see the tissue plans for the procedure.  The assistance was needed for blood control,  tissue re-approximation and assisted with closure of the incision site.

## 2020-10-15 NOTE — Interval H&P Note (Signed)
History and Physical Interval Note:  10/15/2020 12:16 PM  Teresa Golden  has presented today for surgery, with the diagnosis of status post panniculectomy.  The various methods of treatment have been discussed with the patient and family. After consideration of risks, benefits and other options for treatment, the patient has consented to  Procedure(s) with comments: IRRIGATION AND DEBRIDEMENT WOUND (N/A) - 60 min as a surgical intervention.  The patient's history has been reviewed, patient examined, no change in status, stable for surgery.  I have reviewed the patient's chart and labs.  Questions were answered to the patient's satisfaction.     Loel Lofty [name]

## 2020-10-15 NOTE — Anesthesia Preprocedure Evaluation (Signed)
Anesthesia Evaluation  Patient identified by MRN, date of birth, ID band Patient awake    Reviewed: Allergy & Precautions, NPO status , Patient's Chart, lab work & pertinent test results, reviewed documented beta blocker date and time   History of Anesthesia Complications Negative for: history of anesthetic complications  Airway Mallampati: III  TM Distance: >3 FB Neck ROM: Full    Dental  (+) Edentulous Upper, Missing,    Pulmonary former smoker,    Pulmonary exam normal        Cardiovascular hypertension, Pt. on medications and Pt. on home beta blockers Normal cardiovascular exam     Neuro/Psych Depression negative neurological ROS     GI/Hepatic GERD  Controlled,(+) Hepatitis - (treated), C  Endo/Other  diabetes, Type 2, Insulin DependentHypothyroidism   Renal/GU Renal InsufficiencyRenal disease  negative genitourinary   Musculoskeletal  (+) Arthritis ,   Abdominal (+) + obese,   Peds  Hematology  (+) anemia , Hgb 11.1   Anesthesia Other Findings panniculitis  Reproductive/Obstetrics negative OB ROS                             Anesthesia Physical  Anesthesia Plan  ASA: 2  Anesthesia Plan: General   Post-op Pain Management:    Induction: Intravenous  PONV Risk Score and Plan: 3 and 4 or greater and Treatment may vary due to age or medical condition, Ondansetron and Dexamethasone  Airway Management Planned: Oral ETT  Additional Equipment: None  Intra-op Plan:   Post-operative Plan: Extubation in OR  Informed Consent: I have reviewed the patients History and Physical, chart, labs and discussed the procedure including the risks, benefits and alternatives for the proposed anesthesia with the patient or authorized representative who has indicated his/her understanding and acceptance.     Dental advisory given  Plan Discussed with: CRNA  Anesthesia Plan Comments:          Anesthesia Quick Evaluation

## 2020-10-15 NOTE — Transfer of Care (Signed)
Immediate Anesthesia Transfer of Care Note  Patient: JAEYA REGEL  Procedure(s) Performed: IRRIGATION AND DEBRIDEMENT WOUND (Abdomen)  Patient Location: PACU  Anesthesia Type:General  Level of Consciousness: awake, patient cooperative and responds to stimulation  Airway & Oxygen Therapy: Patient Spontanous Breathing and Patient connected to face mask oxygen  Post-op Assessment: Report given to RN, Post -op Vital signs reviewed and stable and Patient moving all extremities X 4  Post vital signs: Reviewed and stable  Last Vitals:  Vitals Value Taken Time  BP 132/58 10/15/20 1445  Temp    Pulse 97 10/15/20 1447  Resp 24 10/15/20 1447  SpO2 100 % 10/15/20 1447  Vitals shown include unvalidated device data.  Last Pain:  Vitals:   10/15/20 1127  TempSrc:   PainSc: 0-No pain         Complications: No notable events documented.

## 2020-10-15 NOTE — Plan of Care (Signed)

## 2020-10-15 NOTE — Care Management Important Message (Signed)
Important Message  Patient Details  Name: Teresa Golden MRN: [id number] Date of Birth: Jul 15, 1945   Medicare Important Message Given:  Yes     Orbie Pyo 10/15/2020, 4:13 PM

## 2020-10-15 NOTE — Chronic Care Management (AMB) (Signed)
Chronic Care Management   CCM RN Visit Note  10/15/2020 Name: Teresa Golden MRN: [id number] DOB: 06/17/1945  Subjective: Teresa Golden is a 75 y.o. year old female who is a primary care patient of Glendale Chard, MD. The care management team was consulted for assistance with disease management and care coordination needs.    Engaged with patient by telephone for follow up visit in response to provider referral for case management and/or care coordination services.   Consent to Services:  The patient was given information about Chronic Care Management services, agreed to services, and gave verbal consent prior to initiation of services.  Please see initial visit note for detailed documentation.   Patient agreed to services and verbal consent obtained.   Assessment: Review of patient past medical history, allergies, medications, health status, including review of consultants reports, laboratory and other test data, was performed as part of comprehensive evaluation and provision of chronic care management services.   SDOH (Social Determinants of Health) assessments and interventions performed:   CCM Care Plan  Allergies  Allergen Reactions   Meloxicam Other (See Comments)    Fever; muscle aches; "flu-like" symptoms   Other     Rose fever and hay fever     Facility-Administered Encounter Medications as of 10/15/2020  Medication   acetaminophen (TYLENOL) tablet 650 mg   Or   acetaminophen (TYLENOL) suppository 650 mg   albuterol (PROVENTIL) (2.5 MG/3ML) 0.083% nebulizer solution 2.5 mg   amLODipine (NORVASC) tablet 5 mg   ceFAZolin (ANCEF) IVPB 2g/100 mL premix   cefTRIAXone (ROCEPHIN) 2 g in sodium chloride 0.9 % 100 mL IVPB   Chlorhexidine Gluconate Cloth 2 % PADS 6 each   clindamycin (CLEOCIN) IVPB 900 mg   diclofenac Sodium (VOLTAREN) 1 % topical gel 2 g   gabapentin (NEURONTIN) capsule 600 mg   HYDROcodone-acetaminophen (NORCO/VICODIN) 5-325 MG per tablet 1 tablet   insulin  aspart (novoLOG) injection 0-5 Units   insulin aspart (novoLOG) injection 0-9 Units   insulin glargine-yfgn (SEMGLEE) injection 6 Units   levothyroxine (SYNTHROID) tablet 12.5 mcg   levothyroxine (SYNTHROID) tablet 25 mcg   losartan (COZAAR) tablet 100 mg   metoprolol succinate (TOPROL-XL) 24 hr tablet 25 mg   mupirocin ointment (BACTROBAN) 2 % 1 application   ondansetron (ZOFRAN) tablet 4 mg   Or   ondansetron (ZOFRAN) injection 4 mg   vancomycin (VANCOREADY) IVPB 750 mg/150 mL   Outpatient Encounter Medications as of 10/15/2020  Medication Sig Note   amLODipine (NORVASC) 5 MG tablet Take 1 tablet by mouth once daily    B Complex-C (B-COMPLEX WITH VITAMIN C) tablet Take 1 tablet by mouth daily.    Biotin 10000 MCG TABS Take 10,000 mcg by mouth daily.    Cholecalciferol 5000 units TABS Take 5,000 Units by mouth daily.    Colchicine 0.6 MG CAPS TAKE 1 CAPSULE DAILY AS    NEEDED (Patient taking differently: Take 0.6 mg by mouth daily as needed (pain).) 06/20/2020: Patient takes as needed   Cranberry-Vitamin C 250-60 MG CAPS Take 1 tablet by mouth daily.    diclofenac Sodium (VOLTAREN) 1 % GEL APPLY 2 GRAMS TO AFFECTED AREA(S) 4 TIMES DAILY AS NEEDED (Patient taking differently: Apply 2 g topically 4 (four) times daily as needed (pain).)    gabapentin (NEURONTIN) 300 MG capsule Take 600 mg by mouth at bedtime.    glucose blood (ONETOUCH VERIO) test strip Use as instructed to check blood sugars 2 times per day dx: e11.65  insulin degludec (TRESIBA FLEXTOUCH) 100 UNIT/ML SOPN FlexTouch Pen Inject 0.09 mLs (9 Units total) into the skin daily at 10 pm.    losartan (COZAAR) 100 MG tablet Take 100 mg by mouth daily.    metoprolol succinate (TOPROL-XL) 25 MG 24 hr tablet 1 tablet by mouth daily at bedtime    Multiple Vitamins-Minerals (MULTIVITAMIN WITH MINERALS) tablet Take 1 tablet by mouth daily. Women's 50+    naproxen sodium (ANAPROX) 220 MG tablet Take 440 mg by mouth at bedtime. 2 tablets  every night at bedtime 09/17/2020: ALEVE    ondansetron (ZOFRAN) 4 MG tablet TAKE 1 TABLET BY MOUTH EVERY 8 HOURS AS NEEDED FOR NAUSEA FOR VOMITING (Patient taking differently: Take 4 mg by mouth every 8 (eight) hours as needed for nausea or vomiting.)    OneTouch Delica Lancets 99991111 MISC Use as instructed to check blood sugars 2 times per day dx: e11.65    OZEMPIC, 1 MG/DOSE, 2 MG/1.5ML SOPN INJECT INTO SKIN ONCE A WEEK (Patient taking differently: Inject 1 mg into the skin once a week.)    simvastatin (ZOCOR) 10 MG tablet Take 1 tablet (10 mg total) by mouth daily.    SYNTHROID 25 MCG tablet Take 0.5 tablet (12.5 mcg) by mouth on Mondays through Fridays, then take 1 tablet (25 mcg) by mouth on Saturdays and Sundays.     Patient Active Problem List   Diagnosis Date Noted   Wound infection after surgery 10/12/2020   Normocytic anemia 10/12/2020   AKI (acute kidney injury) ([place]) 10/12/2020   Obesity (BMI 30-39.9) 10/12/2020   Sepsis ([place]) 10/12/2020   Type 2 diabetes mellitus with stage 3 chronic kidney disease, with long-term current use of insulin ([place]) 11/29/2019   Panniculitis 09/30/2019   Back pain 09/30/2019   Hepatoma ([place]) 01/21/2019   Other cirrhosis of liver ([place]) 07/05/2018   Hypertensive nephropathy 07/05/2018   Chronic renal disease, stage III ([place]) 07/05/2018   Chronic left shoulder pain 07/05/2018   Hypothyroidism 02/06/2018   Hepatitis C virus infection cured after antiviral drug therapy 02/04/2018   Osteoarthritis of right knee 07/30/2017   S/P laparoscopic sleeve gastrectomy July 2018 08/26/2016   Preop cardiovascular exam 07/30/2016   Dyspnea on exertion 07/30/2016   Bilateral lower extremity edema 07/30/2016    Conditions to be addressed/monitored: DM, CKD III, Hypertensive Nephropathy, Class 2 severe obesity, Anxiety, Panniculitis   Care Plan : Elective Surgery  Updates made by Lynne Logan, RN since 10/15/2020 12:00 AM     Problem: Panniculectomy Procedure    Priority: Medium     Long-Range Goal: Manage stretched out, excess fat and overhanging skin from your abdomen   Start Date: 02/29/2020  Expected End Date: 12/24/2020  Recent Progress: On track  Priority: Medium  Note:   Current Barriers:  Ineffective Self Health Maintenance Currently UNABLE TO independently self manage needs related to chronic health conditions.  Knowledge Deficits related to short term plan for care coordination needs and long term plans for chronic disease management needs Nurse Case Manager Clinical Goal(s):  Patient will work with care management team to address care coordination and chronic disease management needs related to Disease Management Educational Needs Care Coordination Medication Management and Education Psychosocial Support   Interventions:  09/12/20 completed successful outbound call with patient  Determined patient will undergo a Panniculectomy procedure by Dr. Marla Roe on 09/26/20 Determined this procedure is scheduled for outpatient but patient may need inpatient services pending hemodynamic stability following the procedure Determined and discussed the  following appointments related to this procedure:  09/04/20-Pre-Op visit for Panniculectomy procedure (Dr. Marla Roe) 09/24/20-Lab visit for Panniculectomy procedure 09/26/20-Panniculectomy outpatient/inpatient procedure  09/30/20-Post-Op Panniculectomy follow up appointment with Dr. Marla Roe  10/12/20-Post-Op Panniculectomy follow up appointments with Roetta Sessions, PA-C  Determined patient will have a friend available and use an agency referred by [facility] to assist her as needed following surgery Discussed plans with patient for ongoing care management follow up and provided patient with direct contact information for care management team. 10/03/20 completed inbound call with patient  Determined patient is experiencing bleeding around her wound drain that has increased throughout the  morning Determined patient contacted her surgeon Dr. Marla Roe to report her symptoms, she was advised to come into the office for further evaluation  Determined patient's friend is driving her into the office now  Instructed patient to continue to follow MD recommendations and to continue to report concerns to her MD as directed  Discussed plans with patient for ongoing care management follow up and provided patient with direct contact information for care management team 10/15/20 completed successful outbound call with patient  Determined patient is inpatient at [facility] for wound infection following the Panniculectomy procedure she underwent on 09/26/20 Determined patient will undergo an irrigation and debridement procedure today and is currently receiving IV antibiotics, she was transfused 2 units of packed red blood cells on 10/14/20 due to having H/H 7.3/22.7 Discussed discharge planning includes plans to d/c to inpatient rehab once ready Discussed plans with patient for ongoing care management follow up and provided patient with direct contact information for care management team Patient Self-Care Activities:  Keep the following appointments related to pre-op and post-op Panniculectomy 09/04/20-Pre-Op visit for Panniculectomy procedure (Dr. Marla Roe) 09/24/20-Lab visit for Panniculectomy procedure 09/26/20-Panniculectomy outpatient/inpatient procedure  09/30/20-Post-Op Panniculectomy follow up appointment with Dr. Marla Roe  10/12/20-Post-Op Panniculectomy follow up appointments with Roetta Sessions, PA-C               Follow Up Plan: Telephone follow up appointment with care management team member scheduled for:  11/05/20    Plan:Telephone follow up appointment with care management team member scheduled for:  11/05/20  Barb Merino, RN, BSN, CCM Care Management Coordinator [place] [facility]  Direct Phone: [phone]

## 2020-10-15 NOTE — Anesthesia Procedure Notes (Signed)
Procedure Name: Intubation Date/Time: 10/15/2020 1:03 PM Performed by: Michele Rockers, CRNA Pre-anesthesia Checklist: Patient identified, Patient being monitored, Timeout performed, Emergency Drugs available and Suction available Patient Re-evaluated:Patient Re-evaluated prior to induction Oxygen Delivery Method: Circle System Utilized Preoxygenation: Pre-oxygenation with 100% oxygen Induction Type: IV induction Ventilation: Mask ventilation without difficulty Laryngoscope Size: Miller and 2 Grade View: Grade I Tube type: Oral Tube size: 7.0 mm Number of attempts: 1 Airway Equipment and Method: Stylet Placement Confirmation: ETT inserted through vocal cords under direct vision, positive ETCO2 and breath sounds checked- equal and bilateral Secured at: 21 cm Tube secured with: Tape Dental Injury: Teeth and Oropharynx as per pre-operative assessment

## 2020-10-16 ENCOUNTER — Encounter (HOSPITAL_COMMUNITY): Payer: Self-pay | Admitting: Plastic Surgery

## 2020-10-16 DIAGNOSIS — T8149XA Infection following a procedure, other surgical site, initial encounter: Secondary | ICD-10-CM | POA: Diagnosis not present

## 2020-10-16 LAB — CBC
HCT: 24.9 % — ABNORMAL LOW (ref 36.0–46.0)
Hemoglobin: 8.4 g/dL — ABNORMAL LOW (ref 12.0–15.0)
MCH: 28.7 pg (ref 26.0–34.0)
MCHC: 33.7 g/dL (ref 30.0–36.0)
MCV: 85 fL (ref 80.0–100.0)
Platelets: 241 10*3/uL (ref 150–400)
RBC: 2.93 MIL/uL — ABNORMAL LOW (ref 3.87–5.11)
RDW: 15.5 % (ref 11.5–15.5)
WBC: 9.3 10*3/uL (ref 4.0–10.5)
nRBC: 0 % (ref 0.0–0.2)

## 2020-10-16 LAB — BASIC METABOLIC PANEL
Anion gap: 8 (ref 5–15)
BUN: 24 mg/dL — ABNORMAL HIGH (ref 8–23)
CO2: 21 mmol/L — ABNORMAL LOW (ref 22–32)
Calcium: 8.5 mg/dL — ABNORMAL LOW (ref 8.9–10.3)
Chloride: 106 mmol/L (ref 98–111)
Creatinine, Ser: 1.39 mg/dL — ABNORMAL HIGH (ref 0.44–1.00)
GFR, Estimated: 39 mL/min — ABNORMAL LOW (ref >60)
Glucose, Bld: 286 mg/dL — ABNORMAL HIGH (ref 70–99)
Potassium: 4 mmol/L (ref 3.5–5.1)
Sodium: 135 mmol/L (ref 135–145)

## 2020-10-16 LAB — GLUCOSE, CAPILLARY
Glucose-Capillary: 258 mg/dL — ABNORMAL HIGH (ref 70–99)
Glucose-Capillary: 268 mg/dL — ABNORMAL HIGH (ref 70–99)
Glucose-Capillary: 238 mg/dL — ABNORMAL HIGH (ref 70–99)
Glucose-Capillary: 219 mg/dL — ABNORMAL HIGH (ref 70–99)
Glucose-Capillary: 226 mg/dL — ABNORMAL HIGH (ref 70–99)

## 2020-10-16 MED ORDER — ENOXAPARIN SODIUM 60 MG/0.6ML IJ SOSY
50.0000 mg | PREFILLED_SYRINGE | Freq: Every day | INTRAMUSCULAR | Status: DC
  Administered 2020-10-16 – 2020-10-22 (×7): 50 mg via SUBCUTANEOUS
  Filled 2020-10-16 (×7): qty 0.6

## 2020-10-16 MED ORDER — INSULIN GLARGINE-YFGN 100 UNIT/ML ~~LOC~~ SOLN
9.0000 [IU] | Freq: Every day | SUBCUTANEOUS | Status: DC
  Administered 2020-10-17 – 2020-10-21 (×6): 9 [IU] via SUBCUTANEOUS
  Filled 2020-10-16 (×8): qty 0.09

## 2020-10-16 MED ORDER — INSULIN ASPART 100 UNIT/ML IJ SOLN
0.0000 [IU] | Freq: Every day | INTRAMUSCULAR | Status: DC
  Administered 2020-10-16: 2 [IU] via SUBCUTANEOUS
  Administered 2020-10-20: 3 [IU] via SUBCUTANEOUS
  Administered 2020-10-21: 2 [IU] via SUBCUTANEOUS

## 2020-10-16 MED ORDER — INSULIN ASPART 100 UNIT/ML IJ SOLN
0.0000 [IU] | Freq: Three times a day (TID) | INTRAMUSCULAR | Status: DC
  Administered 2020-10-17 (×2): 3 [IU] via SUBCUTANEOUS
  Administered 2020-10-17: 2 [IU] via SUBCUTANEOUS
  Administered 2020-10-18 (×2): 3 [IU] via SUBCUTANEOUS
  Administered 2020-10-18: 5 [IU] via SUBCUTANEOUS
  Administered 2020-10-19: 3 [IU] via SUBCUTANEOUS
  Administered 2020-10-19: 5 [IU] via SUBCUTANEOUS
  Administered 2020-10-19: 3 [IU] via SUBCUTANEOUS
  Administered 2020-10-20: 8 [IU] via SUBCUTANEOUS
  Administered 2020-10-20 – 2020-10-21 (×3): 5 [IU] via SUBCUTANEOUS
  Administered 2020-10-21: 8 [IU] via SUBCUTANEOUS
  Administered 2020-10-21: 3 [IU] via SUBCUTANEOUS
  Administered 2020-10-22: 2 [IU] via SUBCUTANEOUS
  Administered 2020-10-22: 3 [IU] via SUBCUTANEOUS

## 2020-10-16 NOTE — Progress Notes (Signed)
Occupational Therapy Evaluation Patient Details Name: Teresa Golden MRN: [id number] DOB: 04/24/1945 Today's Date: 10/16/2020    History of Present Illness 75 year old female presents with suspected postop wound infection vs. dehiscence and is now s/p I&D of open incision (abdomen) on 8/22. She had elective panniculectomy and repair of supreumbilical hernia performed on 8/3.  Medical history significant for hypertension, DM2, hypothyroidism and obesity   Clinical Impression   [name] was evaluated s/p the above admission list. PTA pt was mod I with use of rollator and tub bench for bathing, she lives alone in a home with 1 STE. Upon evaluation, pt was min A for all mobility given verbal cues for safety, sequencing and line management. Pt is requiring up to mod A for lower body ADLs due to generalized weakness, increased body habitus and discomfort with reaching. Pt would benefit from continued OT acutely. Recommend d/c to [facility].     Follow Up Recommendations  SNF;Supervision/Assistance - 24 hour    Equipment Recommendations  Other (comment) (defer to next venue)       Precautions / Restrictions Precautions Precautions: Fall Precaution Comments: JP drain, wound vac Restrictions Weight Bearing Restrictions: No      Mobility Bed Mobility Overal bed mobility: Needs Assistance Bed Mobility: Sit to Supine       Sit to supine: Min assist   General bed mobility comments: min A to manage BLE back into bed    Transfers Overall transfer level: Needs assistance Equipment used: Rolling walker (2 wheeled) Transfers: Sit to/from Stand Sit to Stand: Min assist         General transfer comment: min A to power up    Balance Overall balance assessment: Needs assistance Sitting-balance support: Feet supported Sitting balance-Leahy Scale: Fair     Standing balance support: Bilateral upper extremity supported Standing balance-Leahy Scale: Poor         ADL either performed or assessed  with clinical judgement   ADL Overall ADL's : Needs assistance/impaired Eating/Feeding: Independent;Sitting   Grooming: Set up;Sitting   Upper Body Bathing: Minimal assistance;Sitting   Lower Body Bathing: Moderate assistance;Sit to/from stand   Upper Body Dressing : Set up;Sitting   Lower Body Dressing: Moderate assistance;Sit to/from stand   Toilet Transfer: Minimal assistance;Cueing for safety;Ambulation;BSC;RW   Toileting- Clothing Manipulation and Hygiene: Moderate assistance;Sit to/from stand       Functional mobility during ADLs: Minimal assistance;Rolling walker;Cueing for safety General ADL Comments: mod A for all standing tasks due to pt having difficulty reaching with UEs which exacerbates pain     Vision Patient Visual Report: No change from baseline Vision Assessment?: No apparent visual deficits            Pertinent Vitals/Pain Pain Assessment: Faces Faces Pain Scale: Hurts little more Pain Descriptors / Indicators: Discomfort;Grimacing Pain Intervention(s): Limited activity within patient's tolerance;Monitored during session     Hand Dominance Right   Extremity/Trunk Assessment Upper Extremity Assessment Upper Extremity Assessment: Generalized weakness   Lower Extremity Assessment Lower Extremity Assessment: Defer to PT evaluation   Cervical / Trunk Assessment Cervical / Trunk Assessment: Other exceptions (incrased body habitus)   Communication Communication Communication: No difficulties   Cognition Arousal/Alertness: Awake/alert Behavior During Therapy: WFL for tasks assessed/performed Overall Cognitive Status: Within Functional Limits for tasks assessed             General Comments  VSS on RA, drain and wound vac in tact            Home Living  Family/patient expects to be discharged to:: Skilled nursing facility Living Arrangements: Alone Available Help at Discharge: Friend(s);Available PRN/intermittently   Home Access: Stairs  to enter Entrance Stairs-Number of Steps: 1   Home Layout: One level     Bathroom Shower/Tub: Teacher, early years/pre: Standard     Home Equipment: Environmental consultant - 4 wheels;Tub bench          Prior Functioning/Environment Level of Independence: Independent with assistive device(s)                 OT Problem List: Decreased strength;Impaired balance (sitting and/or standing);Decreased range of motion;Decreased activity tolerance;Decreased knowledge of use of DME or AE;Decreased safety awareness;Decreased knowledge of precautions;Pain      OT Treatment/Interventions: Self-care/ADL training;Therapeutic exercise;Balance training;Patient/family education;Modalities;Therapeutic activities;DME and/or AE instruction    OT Goals(Current goals can be found in the care plan section) Acute Rehab OT Goals Patient Stated Goal: get stronger OT Goal Formulation: With patient Time For Goal Achievement: 10/30/20 Potential to Achieve Goals: Fair ADL Goals Pt Will Perform Grooming: with supervision;standing Pt Will Perform Lower Body Bathing: with set-up;sit to/from stand Pt Will Perform Lower Body Dressing: with min guard assist;sit to/from stand Pt Will Transfer to Toilet: with supervision;ambulating Pt Will Perform Toileting - Clothing Manipulation and hygiene: with supervision;sit to/from stand Pt/caregiver will Perform Home Exercise Program: Increased strength;Both right and left upper extremity;With written HEP provided  OT Frequency: Min 2X/week   Barriers to D/C: Decreased caregiver support  pt lives alone          AM-PAC OT "6 Clicks" Daily Activity     Outcome Measure Help from another person eating meals?: None Help from another person taking care of personal grooming?: A Little Help from another person toileting, which includes using toliet, bedpan, or urinal?: A Lot Help from another person bathing (including washing, rinsing, drying)?: A Lot Help from another  person to put on and taking off regular upper body clothing?: None Help from another person to put on and taking off regular lower body clothing?: A Lot 6 Click Score: 17   End of Session Equipment Utilized During Treatment: Gait belt;Rolling walker Nurse Communication: Mobility status;Precautions;Weight bearing status  Activity Tolerance: Patient tolerated treatment well Patient left: in bed;with call bell/phone within reach;with bed alarm set  OT Visit Diagnosis: Unsteadiness on feet (R26.81);Other abnormalities of gait and mobility (R26.89);Pain;Muscle weakness (generalized) (M62.81)                Time: [id number] OT Time Calculation (min): 16 min Charges:  OT General Charges $OT Visit: 1 Visit OT Evaluation $OT Eval Moderate Complexity: 1 Mod    Alexia Dinger A Moo Gravley 10/16/2020, 12:58 PM

## 2020-10-16 NOTE — Progress Notes (Signed)
PROGRESS NOTE   Teresa Golden  [id number]    DOB: 12-Mar-1945    DOA: 10/12/2020  PCP: Glendale Chard, MD   I have briefly reviewed patients previous medical records in [facility].  Chief Complaint  Patient presents with   Wound Infection    Brief Narrative:  75 year old female with medical history significant for hypertension, DM2, hypothyroidism and obesity presents with suspected postop wound infection.  She had elective panniculectomy performed on 8/3 by Dr. Tedra Coupe him, plastic surgery and was found to have a supraumbilical hernia which was repaired by Dr. Quinn Plowman of general surgery.  She now presented with subjective fevers, chills and malaise and 3 to 400 mL output per day from the drain of brownish-yellow fluid.  She had gone to the plastic surgery office for routine follow-up and was advised to come to the hospital for further evaluation.  In the ED febrile up to 100.6 F.  Started empirically on antibiotics.  Plastic surgery was consulted, suspected more of her dehiscence than infection and underwent debridement of open incision in OR on 8/22.   Assessment & Plan:  Principal Problem:   Wound infection after surgery Active Problems:   Hypothyroidism   Type 2 diabetes mellitus with stage 3 chronic kidney disease, with long-term current use of insulin (HCC)   Normocytic anemia   AKI (acute kidney injury) ([place])   Obesity (BMI 30-39.9)   Sepsis ([place])   Sepsis secondary to postoperative wound infection: S/p panniculectomy on 8/3 by Dr. Marla Roe.  Febrile up to 101.3 with tachycardia and tachypnea.  WBC 4.5 with left shift but lactate normal.  CT abdomen and pelvis significant for postsurgical changes with concern for subcutaneous gas to rule out potential necrotizing fasciitis.  Started empirically on IV vancomycin, Rocephin and clindamycin.  Plastic surgery was consulted and underwent debridement in OR on 8/23. Per plastic surgery follow-up, would benefit from SNF  placement at discharge, leave wound VAC in place x1 week, may change at bedside to remove at bedside next week, likely to leave JP drain in place for the next 1 to 2 weeks.  Clindamycin discontinued.  Remains on IV ceftriaxone and vancomycin.  Consider transitioning to oral antibiotics in a.m. (will discuss with ID and Dr. Marla Roe).  Improved  Anemia: S/p 2 units PRBC transfusion thus far this admission.  Hemoglobin up to 8.1 >8.4.  Follow CBC daily and transfuse if hemoglobin 7 g or less.  Essential hypertension: Controlled.  Continue home dose of amlodipine, losartan and Toprol-XL.  Hypothyroidism: Continue Synthroid.  Type II DM: A1c 6.4 on 7/26.  Continue with SSI, glargine insulin.  CBG uncontrolled in the 200s today.  Adjust insulins as needed.  Acute kidney injury: Creatinine slightly up from 1.27-1.39, possibly related to procedure yesterday.  Follow BMP in AM.  Encourage oral fluid intake.  Body mass index is 35.61 kg/m.   DVT prophylaxis: SCDs Start: 10/12/20 1602     Code Status: Full Code Family Communication: None at bedside. Disposition:  Status is: Inpatient  Remains inpatient appropriate because:Inpatient level of care appropriate due to severity of illness  Dispo: The patient is from: Home              Anticipated d/c is to: SNF              Patient currently is not medically stable to d/c.   Difficult to place patient No        Consultants:   Plastic surgery  Procedures:  Debridement of postop abdominal wound in OR 8/22.  Antimicrobials:    Anti-infectives (From admission, onward)    Start     Dose/Rate Route Frequency Ordered Stop   10/15/20 1255  ceFAZolin 1 g / gentamicin 80 mg in NS 500 mL surgical irrigation  Status:  Discontinued          As needed 10/15/20 1255 10/15/20 1441   10/15/20 0745  ceFAZolin (ANCEF) IVPB 2g/100 mL premix        2 g 200 mL/hr over 30 Minutes Intravenous On call to O.R. 10/15/20 [id number] 10/15/20 1325   10/13/20 1600   cefTRIAXone (ROCEPHIN) 2 g in sodium chloride 0.9 % 100 mL IVPB        2 g 200 mL/hr over 30 Minutes Intravenous Every 24 hours 10/12/20 1606     10/13/20 1400  vancomycin (VANCOREADY) IVPB 750 mg/150 mL        750 mg 150 mL/hr over 60 Minutes Intravenous Every 24 hours 10/12/20 1314     10/13/20 0000  clindamycin (CLEOCIN) IVPB 900 mg  Status:  Discontinued        900 mg 100 mL/hr over 30 Minutes Intravenous Every 8 hours 10/12/20 1626 10/16/20 0916   10/12/20 2200  clindamycin (CLEOCIN) IVPB 600 mg  Status:  Discontinued        600 mg 100 mL/hr over 30 Minutes Intravenous Every 8 hours 10/12/20 1606 10/12/20 1626   10/12/20 1530  clindamycin (CLEOCIN) IVPB 600 mg        600 mg 100 mL/hr over 30 Minutes Intravenous  Once 10/12/20 1519 10/12/20 1935   10/12/20 1200  vancomycin (VANCOREADY) IVPB 1500 mg/300 mL        1,500 mg 150 mL/hr over 120 Minutes Intravenous  Once 10/12/20 1153 10/12/20 1743   10/12/20 1200  cefTRIAXone (ROCEPHIN) 2 g in sodium chloride 0.9 % 100 mL IVPB        2 g 200 mL/hr over 30 Minutes Intravenous  Once 10/12/20 1153 10/12/20 1253         Subjective:  Feels much better.  Able to mobilize much more.  Up in chair.  Not much pain at surgery site-controlled with oral meds.  Objective:   Vitals:   10/15/20 1835 10/15/20 2048 10/16/20 0422 10/16/20 1147  BP: (!) 139/58 (!) 160/83 140/65 (!) 123/53  Pulse: 79 82 66 63  Resp: '18 17 20   '$ Temp: 98.5 F (36.9 C) 98 F (36.7 C) 97.6 F (36.4 C) 98.3 F (36.8 C)  TempSrc: Oral Oral Oral Oral  SpO2: 96% 96% 99% 100%  Weight:      Height:        General exam: Elderly female, moderately built and obese sitting up comfortably in reclining chair. Respiratory system: Clear to auscultation.  No increased work of breathing. Cardiovascular system: S1 & S2 heard, RRR. No JVD, murmurs, rubs, gallops or clicks. No pedal edema.  Not on telemetry. Gastrointestinal system: Abdomen is nondistended, soft and nontender.  No organomegaly or masses felt. Normal bowel sounds heard.  Wound VAC in place. Central nervous system: Alert and oriented. No focal neurological deficits. Extremities: Symmetric 5 x 5 power. Skin: No rashes, lesions or ulcers Psychiatry: Judgement and insight appear normal. Mood & affect appropriate.     Data Reviewed:   I have personally reviewed following labs and imaging studies   CBC: Recent Labs  Lab 10/12/20 1205 10/12/20 2131 10/13/20 0120 10/14/20 0116 10/15/20 0038 10/16/20 0111  WBC  4.5  --  5.5 6.1  --  9.3  NEUTROABS 2.2  --   --   --   --   --   HGB 7.3*   < > 7.1* 7.1* 8.1* 8.4*  HCT 22.7*   < > 20.9* 21.8* 23.9* 24.9*  MCV 87.6  --  84.6 85.2  --  85.0  PLT 287  --  228 241  --  241   < > = values in this interval not displayed.    Basic Metabolic Panel: Recent Labs  Lab 10/14/20 0116 10/15/20 0728 10/16/20 0111  NA 136 137 135  K 3.8 3.7 4.0  CL 106 106 106  CO2 20* 23 21*  GLUCOSE 121* 115* 286*  BUN 24* 22 24*  CREATININE 1.54* 1.27* 1.39*  CALCIUM 8.1* 8.3* 8.5*    Liver Function Tests: Recent Labs  Lab 10/12/20 1205  AST 21  ALT 13  ALKPHOS 64  BILITOT 0.8  PROT 6.0*  ALBUMIN 2.4*    CBG: Recent Labs  Lab 10/16/20 1029 10/16/20 1146 10/16/20 1630  GLUCAP 268* 238* 219*    Microbiology Studies:   Recent Results (from the past 240 hour(s))  Resp Panel by RT-PCR (Flu A&B, Covid) Nasopharyngeal Swab     Status: None   Collection Time: 10/12/20 12:05 PM   Specimen: Nasopharyngeal Swab; Nasopharyngeal(NP) swabs in vial transport medium  Result Value Ref Range Status   SARS Coronavirus 2 by RT PCR NEGATIVE NEGATIVE Final    Comment: (NOTE) SARS-CoV-2 target nucleic acids are NOT DETECTED.  The SARS-CoV-2 RNA is generally detectable in upper respiratory specimens during the acute phase of infection. The lowest concentration of SARS-CoV-2 viral copies this assay can detect is 138 copies/mL. A negative result does not  preclude SARS-Cov-2 infection and should not be used as the sole basis for treatment or other patient management decisions. A negative result may occur with  improper specimen collection/handling, submission of specimen other than nasopharyngeal swab, presence of viral mutation(s) within the areas targeted by this assay, and inadequate number of viral copies(<138 copies/mL). A negative result must be combined with clinical observations, patient history, and epidemiological information. The expected result is Negative.  Fact Sheet for Patients:  EntrepreneurPulse.com.au  Fact Sheet for Healthcare Providers:  IncredibleEmployment.be  This test is no t yet approved or cleared by the [place] FDA and  has been authorized for detection and/or diagnosis of SARS-CoV-2 by FDA under an Emergency Use Authorization (EUA). This EUA will remain  in effect (meaning this test can be used) for the duration of the COVID-19 declaration under Section 564(b)(1) of the Act, 21 U.S.C.section 360bbb-3(b)(1), unless the authorization is terminated  or revoked sooner.       Influenza A by PCR NEGATIVE NEGATIVE Final   Influenza B by PCR NEGATIVE NEGATIVE Final    Comment: (NOTE) The Xpert Xpress SARS-CoV-2/FLU/RSV plus assay is intended as an aid in the diagnosis of influenza from Nasopharyngeal swab specimens and should not be used as a sole basis for treatment. Nasal washings and aspirates are unacceptable for Xpert Xpress SARS-CoV-2/FLU/RSV testing.  Fact Sheet for Patients: EntrepreneurPulse.com.au  Fact Sheet for Healthcare Providers: IncredibleEmployment.be  This test is not yet approved or cleared by the [place] FDA and has been authorized for detection and/or diagnosis of SARS-CoV-2 by FDA under an Emergency Use Authorization (EUA). This EUA will remain in effect (meaning this test can be used) for the  duration of the COVID-19 declaration under Section  564(b)(1) of the Act, 21 U.S.C. section 360bbb-3(b)(1), unless the authorization is terminated or revoked.  Performed at [facility], [place] [address] [place], [place] [address]   Blood Culture (routine x 2)     Status: None (Preliminary result)   Collection Time: 10/12/20 12:05 PM   Specimen: BLOOD  Result Value Ref Range Status   Specimen Description BLOOD SITE NOT SPECIFIED  Final   Special Requests   Final    BOTTLES DRAWN AEROBIC AND ANAEROBIC Blood Culture results may not be optimal due to an inadequate volume of blood received in culture bottles   Culture   Final    NO GROWTH 4 DAYS Performed at [facility], [place] [address] [place], [place] [address]    Report Status PENDING  Incomplete  Blood Culture (routine x 2)     Status: None (Preliminary result)   Collection Time: 10/12/20 12:05 PM   Specimen: BLOOD  Result Value Ref Range Status   Specimen Description BLOOD SITE NOT SPECIFIED  Final   Special Requests   Final    BOTTLES DRAWN AEROBIC AND ANAEROBIC Blood Culture results may not be optimal due to an inadequate volume of blood received in culture bottles   Culture   Final    NO GROWTH 4 DAYS Performed at [facility], [place] [address] [place], [place] [address]    Report Status PENDING  Incomplete  Surgical pcr screen     Status: Abnormal   Collection Time: 10/14/20  8:57 PM   Specimen: Nasal Mucosa; Nasal Swab  Result Value Ref Range Status   MRSA, PCR POSITIVE (A) NEGATIVE Final    Comment: RESULT CALLED TO, READ BACK BY AND VERIFIED WITH: C SISON RN 10/14/20 2306 JDW    Staphylococcus aureus POSITIVE (A) NEGATIVE Final    Comment: (NOTE) The Xpert SA Assay (FDA approved for NASAL specimens in patients 48 years of age and older), is one component of a comprehensive surveillance program. It is not intended to diagnose infection nor to guide or monitor treatment. Performed at [facility], [place] [address], [place], [place] [address]      Radiology Studies:  No results found.   Scheduled Meds:    amLODipine  5 mg Oral Daily   Chlorhexidine Gluconate Cloth  6 each Topical Daily   enoxaparin (LOVENOX) injection  50 mg Subcutaneous Daily   gabapentin  600 mg Oral QHS   insulin aspart  0-5 Units Subcutaneous QHS   insulin aspart  0-9 Units Subcutaneous TID WC   insulin glargine-yfgn  6 Units Subcutaneous QHS   levothyroxine  12.5 mcg Oral Once per day on Mon Tue Wed Thu Fri   levothyroxine  25 mcg Oral Once per day on Sun Sat   losartan  100 mg Oral Daily   metoprolol succinate  25 mg Oral QHS   mupirocin ointment  1 application Nasal BID    Continuous Infusions:    cefTRIAXone (ROCEPHIN)  IV 2 g (10/16/20 1624)   vancomycin 750 mg (10/15/20 1647)     LOS: 4 days     Vernell Leep, MD, [place], [facility]. [facility]    To contact the attending provider between 7A-7P or the covering provider during after hours 7P-7A, please log into the web site [email or web address] and access using universal [place] password for that web site. If you do not have the password, please call the hospital operator.  10/16/2020, 5:43 PM

## 2020-10-16 NOTE — Progress Notes (Signed)
1 Day Post-Op  Subjective: Patient reports he is feeling much better than she was a few days ago.  She feels much more alert.  She is not having any infectious symptoms.  She does not report any fevers or chills.  She reports that she has a good appetite.  She has had positive flatus and positive BMs. She is comfortable being discharged to SNF.  Objective: Vital signs in last 24 hours: Temp:  [97.3 F (36.3 C)-99.3 F (37.4 C)] 97.6 F (36.4 C) (08/23 0422) Pulse Rate:  [66-95] 66 (08/23 0422) Resp:  [17-24] 20 (08/23 0422) BP: (131-160)/(46-83) 140/65 (08/23 0422) SpO2:  [95 %-100 %] 99 % (08/23 0422) Weight:  [97.1 kg] 97.1 kg (08/22 1127) Last BM Date: 10/14/20  Intake/Output from previous day: 08/22 0701 - 08/23 0700 In: 1410 [P.O.:360; I.V.:700; IV Piggyback:350] Out: 220 [Drains:210; Blood:10] Intake/Output this shift: Total I/O In: 240 [P.O.:240] Out: -   General appearance: alert, cooperative, no distress, and elderly woman, very pleasant Head: Normocephalic, without obvious abnormality, atraumatic Resp: Unlabored GI: Soft, mild tenderness inferiorly.  Wound VAC in place with good seal noted.  100 cc of serosanguineous drainage in canister.  Minimal distention noted.  Umbilicus incision is intact, no necrosis is noted.  Right JP drain in place with serosanguineous drainage in bulb. Extremities: extremities normal, atraumatic, no cyanosis or edema Pulses: 2+ and symmetric   Lab Results:  CBC Latest Ref Rng & Units 10/16/2020 10/15/2020 10/14/2020  WBC 4.0 - 10.5 K/uL 9.3 - 6.1  Hemoglobin 12.0 - 15.0 g/dL 8.4(L) 8.1(L) 7.1(L)  Hematocrit 36.0 - 46.0 % 24.9(L) 23.9(L) 21.8(L)  Platelets 150 - 400 K/uL 241 - 241    BMET Recent Labs    10/15/20 0728 10/16/20 0111  NA 137 135  K 3.7 4.0  CL 106 106  CO2 23 21*  GLUCOSE 115* 286*  BUN 22 24*  CREATININE 1.27* 1.39*  CALCIUM 8.3* 8.5*   PT/INR No results for input(s): LABPROT, INR in the last 72  hours. ABG No results for input(s): PHART, HCO3 in the last 72 hours.  Invalid input(s): PCO2, PO2  Studies/Results: No results found.  Anti-infectives: Anti-infectives (From admission, onward)    Start     Dose/Rate Route Frequency Ordered Stop   10/15/20 1255  ceFAZolin 1 g / gentamicin 80 mg in NS 500 mL surgical irrigation  Status:  Discontinued          As needed 10/15/20 1255 10/15/20 1441   10/15/20 0745  ceFAZolin (ANCEF) IVPB 2g/100 mL premix        2 g 200 mL/hr over 30 Minutes Intravenous On call to O.R. 10/15/20 [id number] 10/15/20 1325   10/13/20 1600  cefTRIAXone (ROCEPHIN) 2 g in sodium chloride 0.9 % 100 mL IVPB        2 g 200 mL/hr over 30 Minutes Intravenous Every 24 hours 10/12/20 1606     10/13/20 1400  vancomycin (VANCOREADY) IVPB 750 mg/150 mL        750 mg 150 mL/hr over 60 Minutes Intravenous Every 24 hours 10/12/20 1314     10/13/20 0000  clindamycin (CLEOCIN) IVPB 900 mg  Status:  Discontinued        900 mg 100 mL/hr over 30 Minutes Intravenous Every 8 hours 10/12/20 1626 10/16/20 0916   10/12/20 2200  clindamycin (CLEOCIN) IVPB 600 mg  Status:  Discontinued        600 mg 100 mL/hr over 30 Minutes Intravenous Every 8 hours  10/12/20 1606 10/12/20 1626   10/12/20 1530  clindamycin (CLEOCIN) IVPB 600 mg        600 mg 100 mL/hr over 30 Minutes Intravenous  Once 10/12/20 1519 10/12/20 1935   10/12/20 1200  vancomycin (VANCOREADY) IVPB 1500 mg/300 mL        1,500 mg 150 mL/hr over 120 Minutes Intravenous  Once 10/12/20 1153 10/12/20 1743   10/12/20 1200  cefTRIAXone (ROCEPHIN) 2 g in sodium chloride 0.9 % 100 mL IVPB        2 g 200 mL/hr over 30 Minutes Intravenous  Once 10/12/20 1153 10/12/20 1253       Assessment/Plan: s/p Procedure(s): IRRIGATION AND DEBRIDEMENT WOUND  75 year old female status post panniculectomy on 09/26/2020.   She was admitted to the hospital on 10/12/2020 for symptomatic anemia and was given 2 unit of PRBC. She subsequently returned  to the OR yesterday for debridement of abdominal wound.   She is feeling much better.  She has a great appetite.  -Patient would benefit from SNF placement after discharge from hospital.  Patient would benefit from PT evaluation and assistance with increasing activity and mobility. -Leave wound VAC in place for 1 week, may change at bedside or remove at bedside next week. -Will continue to monitor JP drain output, likely leave in place for the next 1 to 2 weeks. -Antibiotics per primary team   DVT prophylaxis with SCDs.    LOS: 4 days    Charlies Constable, PA-C 10/16/2020

## 2020-10-16 NOTE — Plan of Care (Signed)

## 2020-10-16 NOTE — Anesthesia Postprocedure Evaluation (Signed)
Anesthesia Post Note  Patient: Teresa Golden  Procedure(s) Performed: IRRIGATION AND DEBRIDEMENT WOUND (Abdomen)     Patient location during evaluation: PACU Anesthesia Type: General Level of consciousness: sedated Pain management: pain level controlled Vital Signs Assessment: post-procedure vital signs reviewed and stable Respiratory status: spontaneous breathing Cardiovascular status: stable Postop Assessment: no apparent nausea or vomiting Anesthetic complications: no   No notable events documented.  Last Vitals:  Vitals:   10/15/20 2048 10/16/20 0422  BP: (!) 160/83 140/65  Pulse: 82 66  Resp: 17 20  Temp: 36.7 C 36.4 C  SpO2: 96% 99%    Last Pain:  Vitals:   10/16/20 0422  TempSrc: Oral  PainSc:                  Huston Foley

## 2020-10-16 NOTE — NC FL2 (Signed)
[place] LEVEL OF CARE SCREENING TOOL     IDENTIFICATION  Patient Name: Teresa Golden Birthdate: 1945/05/01 Sex: female Admission Date (Current Location): 10/12/2020  [facility] [place] Number:  Herbalist and Address:  [facility] [place], [place] [address], [place], [place] [address]      Provider Number: [id number]  Attending Physician Name and Address:  Modena Jansky, MD  Relative Name and Phone Number:       Current Level of Care: Hospital Recommended Level of Care: [place] Prior Approval Number:    Date Approved/Denied:   PASRR Number: [id number]  Discharge Plan: SNF    Current Diagnoses: Patient Active Problem List   Diagnosis Date Noted   Wound infection after surgery 10/12/2020   Normocytic anemia 10/12/2020   AKI (acute kidney injury) ([place]) 10/12/2020   Obesity (BMI 30-39.9) 10/12/2020   Sepsis ([place]) 10/12/2020   Type 2 diabetes mellitus with stage 3 chronic kidney disease, with long-term current use of insulin ([place]) 11/29/2019   Panniculitis 09/30/2019   Back pain 09/30/2019   Hepatoma ([place]) 01/21/2019   Other cirrhosis of liver ([place]) 07/05/2018   Hypertensive nephropathy 07/05/2018   Chronic renal disease, stage III ([place]) 07/05/2018   Chronic left shoulder pain 07/05/2018   Hypothyroidism 02/06/2018   Hepatitis C virus infection cured after antiviral drug therapy 02/04/2018   Osteoarthritis of right knee 07/30/2017   S/P laparoscopic sleeve gastrectomy July 2018 08/26/2016   Preop cardiovascular exam 07/30/2016   Dyspnea on exertion 07/30/2016   Bilateral lower extremity edema 07/30/2016    Orientation RESPIRATION BLADDER Height & Weight     Self, Time, Situation, Place  Normal Continent Weight: 214 lb (97.1 kg) Height:  '5\' 5"'$  (165.1 cm)  BEHAVIORAL SYMPTOMS/MOOD NEUROLOGICAL BOWEL NUTRITION STATUS      Continent Diet  AMBULATORY STATUS COMMUNICATION OF NEEDS Skin   Limited Assist  Verbally Normal                       Personal Care Assistance Level of Assistance  Bathing, Feeding, Dressing Bathing Assistance: Limited assistance Feeding assistance: Independent Dressing Assistance: Limited assistance     Functional Limitations Info  Sight, Hearing, Speech Sight Info: Adequate   Speech Info: Adequate    SPECIAL CARE FACTORS FREQUENCY  PT (By licensed PT), OT (By licensed OT)     PT Frequency: 5x a week OT Frequency: 5x a week            Contractures Contractures Info: Not present    Additional Factors Info  Code Status, Allergies Code Status Info: Full Allergies Info: Meloxicam   Other           Current Medications (10/16/2020):  This is the current hospital active medication list Current Facility-Administered Medications  Medication Dose Route Frequency Provider Last Rate Last Admin   acetaminophen (TYLENOL) tablet 650 mg  650 mg Oral Q6H PRN Dillingham, Loel Lofty, DO   650 mg at 10/13/20 [id number]   Or   acetaminophen (TYLENOL) suppository 650 mg  650 mg Rectal Q6H PRN Dillingham, Claire S, DO       albuterol (PROVENTIL) (2.5 MG/3ML) 0.083% nebulizer solution 2.5 mg  2.5 mg Nebulization Q6H PRN Dillingham, Loel Lofty, DO       amLODipine (NORVASC) tablet 5 mg  5 mg Oral Daily Dillingham, Claire S, DO   5 mg at 10/16/20 1030   cefTRIAXone (ROCEPHIN) 2 g in sodium  chloride 0.9 % 100 mL IVPB  2 g Intravenous Q24H Dillingham, Loel Lofty, DO 200 mL/hr at 10/15/20 1930 2 g at 10/15/20 1930   Chlorhexidine Gluconate Cloth 2 % PADS 6 each  6 each Topical Daily Dillingham, Loel Lofty, DO   6 each at 10/16/20 1032   diclofenac Sodium (VOLTAREN) 1 % topical gel 2 g  2 g Topical QID PRN Dillingham, Loel Lofty, DO       enoxaparin (LOVENOX) injection 50 mg  50 mg Subcutaneous Daily Modena Jansky, MD   50 mg at 10/16/20 1033   gabapentin (NEURONTIN) capsule 600 mg  600 mg Oral QHS Dillingham, Loel Lofty, DO   600 mg at 10/15/20 2142   HYDROcodone-acetaminophen  (NORCO/VICODIN) 5-325 MG per tablet 1 tablet  1 tablet Oral Q6H PRN Wallace Going, DO   1 tablet at 10/16/20 1228   insulin aspart (novoLOG) injection 0-5 Units  0-5 Units Subcutaneous QHS DillinghamLoel Lofty, DO   3 Units at 10/15/20 2143   insulin aspart (novoLOG) injection 0-9 Units  0-9 Units Subcutaneous TID WC Dillingham, Loel Lofty, DO   3 Units at 10/16/20 1227   insulin glargine-yfgn [facility]) injection 6 Units  6 Units Subcutaneous QHS DillinghamLoel Lofty, DO   6 Units at 10/15/20 2143   levothyroxine (SYNTHROID) tablet 12.5 mcg  12.5 mcg Oral Once per day on Mon Tue Wed Thu Fri Dillingham, Claire S, DO   12.5 mcg at 10/16/20 0430   levothyroxine (SYNTHROID) tablet 25 mcg  25 mcg Oral Once per day on Sun Sat Wallace Going, DO   25 mcg at 10/14/20 0602   losartan (COZAAR) tablet 100 mg  100 mg Oral Daily Dillingham, Loel Lofty, DO   100 mg at 10/16/20 1030   metoprolol succinate (TOPROL-XL) 24 hr tablet 25 mg  25 mg Oral QHS Dillingham, Loel Lofty, DO   25 mg at 10/15/20 2143   mupirocin ointment (BACTROBAN) 2 % 1 application  1 application Nasal BID Dillingham, Loel Lofty, DO   1 application at 0000000 1032   ondansetron (ZOFRAN) tablet 4 mg  4 mg Oral Q6H PRN Dillingham, Loel Lofty, DO       Or   ondansetron (ZOFRAN) injection 4 mg  4 mg Intravenous Q6H PRN Dillingham, Loel Lofty, DO       vancomycin (VANCOREADY) IVPB 750 mg/150 mL  750 mg Intravenous Q24H Dillingham, Loel Lofty, DO 150 mL/hr at 10/15/20 1647 750 mg at 10/15/20 1647     Discharge Medications: Please see discharge summary for a list of discharge medications.  Relevant Imaging Results:  Relevant Lab Results:   Additional Information SSN: [id number]  Emeterio Reeve, LCSW

## 2020-10-16 NOTE — TOC Initial Note (Signed)
Transition of Care [facility]) - Initial/Assessment Note    Patient Details  Name: Teresa Golden MRN: [id number] Date of Birth: 1945-04-13  [facility] CM/SW Contact:    Emeterio Reeve, LCSW Phone Number: 10/16/2020, 4:05 PM  Clinical Narrative:                  CSW received SNF consult. CSW met with pt at bedside. CSW introduced self and explained role at the hospital. Pt reports that PTA she lives at home alone. Pt reports she is independent with mobility and and ADL's. Pt has a friend that check on her and can offer light support when needed.  CSW reviewed PT/OT recommendations for SNF. Pt reports (she is open to going to SNF. Pt prefers [facility] but they unfortunately does not accept her insurance. Pt gave CSW permission to fax out to facilities in the area. Pt has no preference of facility at this time. CSW gave pt medicare.gov rating list to review. CSW explained insurance auth process. Pt reports they are covid vaccinated with booster.  #:[id number] CSW gave pt bed offers. CSW will review and have an answer tomorrow morning.  CSW will continue to follow.   Expected Discharge Plan: Skilled Nursing Facility Barriers to Discharge: Ship broker, Continued Medical Work up   Patient Goals and CMS Choice   CMS Medicare.gov Compare Post Acute Care list provided to:: Patient Choice offered to / list presented to : Patient  Expected Discharge Plan and Services Expected Discharge Plan: [place] arrangements for the past 2 months: [place]                                      Prior Living Arrangements/Services Living arrangements for the past 2 months: [place] Lives with:: Self Patient language and need for interpreter reviewed:: Yes Do you feel safe going back to the place where you live?: Yes      Need for Family Participation in Patient Care: Yes (Comment) Care giver support system in  place?: Yes (comment)   Criminal Activity/Legal Involvement Pertinent to Current Situation/Hospitalization: No - Comment as needed  Activities of Daily Living Home Assistive Devices/Equipment: CBG Meter, Walker (specify type), Cane (specify quad or straight), Blood pressure cuff, Bedside commode/3-in-1, Shower chair without back ADL Screening (condition at time of admission) Patient's cognitive ability adequate to safely complete daily activities?: Yes Is the patient deaf or have difficulty hearing?: No Does the patient have difficulty seeing, even when wearing glasses/contacts?: No Does the patient have difficulty concentrating, remembering, or making decisions?: No Patient able to express need for assistance with ADLs?: Yes Does the patient have difficulty dressing or bathing?: Yes Independently performs ADLs?: No Communication: Independent Dressing (OT): Needs assistance Is this a change from baseline?: Change from baseline, expected to last <3days Grooming: Needs assistance Is this a change from baseline?: Change from baseline, expected to last <3 days Feeding: Independent Bathing: Needs assistance Is this a change from baseline?: Change from baseline, expected to last <3 days Toileting: Needs assistance Is this a change from baseline?: Change from baseline, expected to last <3 days In/Out Bed: Needs assistance Is this a change from baseline?: Change from baseline, expected to last <3 days Walks in Home: Needs assistance Is this a change from baseline?: Change from baseline, expected to last <3 days Does the  patient have difficulty walking or climbing stairs?: Yes Weakness of Legs: Both Weakness of Arms/Hands: None  Permission Sought/Granted Permission sought to share information with : Facility Art therapist granted to share information with : Yes, Verbal Permission Granted     Permission granted to share info w AGENCY: snf        Emotional  Assessment Appearance:: Appears stated age Attitude/Demeanor/Rapport: Engaged Affect (typically observed): Appropriate Orientation: : Oriented to Self, Oriented to Place, Oriented to  Time, Oriented to Situation Alcohol / Substance Use: Not Applicable Psych Involvement: No (comment)  Admission diagnosis:  Dehydration [E86.0] Skin necrosis ([place]) [I96] AKI (acute kidney injury) ([place]) [N17.9] Wound infection after surgery [T81.49XA] Symptomatic anemia [D64.9] Patient Active Problem List   Diagnosis Date Noted   Wound infection after surgery 10/12/2020   Normocytic anemia 10/12/2020   AKI (acute kidney injury) ([place]) 10/12/2020   Obesity (BMI 30-39.9) 10/12/2020   Sepsis ([place]) 10/12/2020   Type 2 diabetes mellitus with stage 3 chronic kidney disease, with long-term current use of insulin ([place]) 11/29/2019   Panniculitis 09/30/2019   Back pain 09/30/2019   Hepatoma ([place]) 01/21/2019   Other cirrhosis of liver ([place]) 07/05/2018   Hypertensive nephropathy 07/05/2018   Chronic renal disease, stage III ([place]) 07/05/2018   Chronic left shoulder pain 07/05/2018   Hypothyroidism 02/06/2018   Hepatitis C virus infection cured after antiviral drug therapy 02/04/2018   Osteoarthritis of right knee 07/30/2017   S/P laparoscopic sleeve gastrectomy July 2018 08/26/2016   Preop cardiovascular exam 07/30/2016   Dyspnea on exertion 07/30/2016   Bilateral lower extremity edema 07/30/2016   PCP:  Glendale Chard, MD Pharmacy:   [facility], [address] [place]. [place] [address] Phone: [phone] Fax: [phone]     Social Determinants of Health (SDOH) Interventions    Readmission Risk Interventions No flowsheet data found.  Emeterio Reeve, [place] Clinical Social Worker [phone]

## 2020-10-16 NOTE — Progress Notes (Signed)
Pharmacy Antibiotic Note  Teresa Golden is a 75 y.o. female admitted on 10/12/2020 presenting from plastic surgery clinic with necrotic tissue on lower abdomen, cloudy JP drainage now s/p I&D  Pharmacy has been consulted for vancomycin dosing.  Ceftriaxone per MD  Discussed with team who agrees with stopping clindamycin. May DC antibiotics tomorrow, will hold off on vancomycin levels for now. Renal function stable.   Plan: Vancomycin 750 mg IV q 24h (eAUC 512, Goal AUC 400-550, SCr 1.39) Ceftriaxone per MD Stop clindamycin Monitor cultures, clinical status, renal fx, vanc levels if continuing Narrow abx as able and f/u duration    Height: '5\' 5"'$  (165.1 cm) Weight: 97.1 kg (214 lb) IBW/kg (Calculated) : 57  Temp (24hrs), Avg:98.1 F (36.7 C), Min:97.3 F (36.3 C), Max:99.3 F (37.4 C)  Recent Labs  Lab 10/12/20 1203 10/12/20 1205 10/13/20 0120 10/14/20 0116 10/15/20 0728 10/16/20 0111  WBC  --  4.5 5.5 6.1  --  9.3  CREATININE  --  1.39* 1.43* 1.54* 1.27* 1.39*  LATICACIDVEN 1.8  --   --   --   --   --      Estimated Creatinine Clearance: 39.7 mL/min (A) (by C-G formula based on SCr of 1.39 mg/dL (H)).    Allergies  Allergen Reactions   Meloxicam Other (See Comments)    Fever; muscle aches; "flu-like" symptoms   Other     Rose fever and hay fever    Antimicrobials Clindamycin 8/19 >>8/23 Ceftriaxone 8/19 >> Vancomycin 8/19 >>  Microbiology 8/21 MRSA +  8/19 Bcx ngtd   Benetta Spar, PharmD, BCPS, [facility] Clinical Pharmacist  Please check AMION for all [facility] phone numbers After 10:00 PM, call [place] [phone]

## 2020-10-16 NOTE — Evaluation (Signed)
Physical Therapy Evaluation Patient Details Name: Teresa Golden MRN: [id number] DOB: Sep 22, 1945 Today's Date: 10/16/2020   History of Present Illness  75 year old female presents with suspected postop wound infection vs. dehiscence and is now s/p I&D of open incision (abdomen) on 8/22. She had elective panniculectomy and repair of supreumbilical hernia performed on 8/3.  Medical history significant for hypertension, DM2, hypothyroidism and obesity  Clinical Impression   Patient received in bed, pleasant but very anxious regarding mobility. Able to get OOB and gait train in room on a MinA level with RW, just anxious with mobility and easily fatigued/grossly weak. Left up in recliner with all needs met, visitor present and RN aware of patient status. Will benefit from ongoing rehab in SNF setting moving forward.     Follow Up Recommendations SNF;Supervision/Assistance - 24 hour (prefers [facility] or [facility])    Equipment Recommendations  Rolling walker with 5" wheels;3in1 (PT)    Recommendations for Other Services       Precautions / Restrictions Precautions Precautions: Fall Precaution Comments: JP drain, wound vac Restrictions Weight Bearing Restrictions: No      Mobility  Bed Mobility Overal bed mobility: Needs Assistance Bed Mobility: Rolling;Sidelying to Sit Rolling: Min assist Sidelying to sit: Min assist   Sit to supine: Min assist   General bed mobility comments: MinA and cues for rolling technique, also MinA to boost up to midline sitting at EOB    Transfers Overall transfer level: Needs assistance Equipment used: Rolling walker (2 wheeled) Transfers: Sit to/from Stand Sit to Stand: Min assist         General transfer comment: min A to power up, good awareness of hand placement  Ambulation/Gait Ambulation/Gait assistance: Min guard Gait Distance (Feet): 25 Feet Assistive device: Rolling walker (2 wheeled) Gait Pattern/deviations: Step-through  pattern;Decreased step length - right;Decreased step length - left;Decreased stride length Gait velocity: decreased   General Gait Details: slow but steady with RW, just generally weak and easily fatigued  Stairs            Wheelchair Mobility    Modified Rankin (Stroke Patients Only)       Balance Overall balance assessment: Needs assistance Sitting-balance support: Feet supported Sitting balance-Leahy Scale: Fair     Standing balance support: Bilateral upper extremity supported Standing balance-Leahy Scale: Poor                               Pertinent Vitals/Pain Pain Assessment: Faces Faces Pain Scale: Hurts a little bit Pain Descriptors / Indicators: Discomfort;Grimacing Pain Intervention(s): Limited activity within patient's tolerance;Monitored during session;Repositioned    Home Living Family/patient expects to be discharged to:: Skilled nursing facility Living Arrangements: Alone Available Help at Discharge: Friend(s);Available PRN/intermittently   Home Access: Stairs to enter   Entrance Stairs-Number of Steps: 1 Home Layout: One level Home Equipment: [place] - 4 wheels;Tub bench      Prior Function Level of Independence: Independent with assistive device(s)               Hand Dominance   Dominant Hand: Right    Extremity/Trunk Assessment   Upper Extremity Assessment Upper Extremity Assessment: Defer to OT evaluation    Lower Extremity Assessment Lower Extremity Assessment: Generalized weakness    Cervical / Trunk Assessment Cervical / Trunk Assessment: Other exceptions Cervical / Trunk Exceptions: increased body habitus  Communication   Communication: No difficulties  Cognition Arousal/Alertness: Awake/alert Behavior During Therapy: WFL for  tasks assessed/performed Overall Cognitive Status: Within Functional Limits for tasks assessed                                        General Comments General  comments (skin integrity, edema, etc.): VSS on RA, drain and wound vac in tact    Exercises     Assessment/Plan    PT Assessment Patient needs continued PT services  PT Problem List Decreased strength;Obesity;Decreased activity tolerance;Decreased safety awareness;Decreased balance;Decreased mobility       PT Treatment Interventions DME instruction;Balance training;Gait training;Stair training;Functional mobility training;Patient/family education;Therapeutic activities;Therapeutic exercise    PT Goals (Current goals can be found in the Care Plan section)  Acute Rehab PT Goals Patient Stated Goal: get stronger PT Goal Formulation: With patient Time For Goal Achievement: 10/30/20 Potential to Achieve Goals: Good    Frequency Min 2X/week   Barriers to discharge        Co-evaluation               AM-PAC PT "6 Clicks" Mobility  Outcome Measure Help needed turning from your back to your side while in a flat bed without using bedrails?: A Little Help needed moving from lying on your back to sitting on the side of a flat bed without using bedrails?: A Little Help needed moving to and from a bed to a chair (including a wheelchair)?: A Little Help needed standing up from a chair using your arms (e.g., wheelchair or bedside chair)?: A Little Help needed to walk in hospital room?: A Little Help needed climbing 3-5 steps with a railing? : A Lot 6 Click Score: 17    End of Session Equipment Utilized During Treatment: Gait belt Activity Tolerance: Patient tolerated treatment well Patient left: in chair;with call bell/phone within reach;with family/visitor present Nurse Communication: Mobility status PT Visit Diagnosis: Unsteadiness on feet (R26.81);Muscle weakness (generalized) (M62.81);Pain Pain - Right/Left:  (midline) Pain - part of body:  (abdomen)    Time: 0623-7628 PT Time Calculation (min) (ACUTE ONLY): 27 min   Charges:   PT Evaluation $PT Eval Moderate  Complexity: 1 Mod PT Treatments $Gait Training: 8-22 mins       Windell Norfolk, DPT, PN2   Supplemental Physical Therapist [place]    Pager [phone] Acute Rehab Office [phone]

## 2020-10-17 DIAGNOSIS — T8149XA Infection following a procedure, other surgical site, initial encounter: Secondary | ICD-10-CM | POA: Diagnosis not present

## 2020-10-17 LAB — CULTURE, BLOOD (ROUTINE X 2)
Culture: NO GROWTH
Culture: NO GROWTH

## 2020-10-17 LAB — CBC
HCT: 24.4 % — ABNORMAL LOW (ref 36.0–46.0)
Hemoglobin: 8.1 g/dL — ABNORMAL LOW (ref 12.0–15.0)
MCH: 28.5 pg (ref 26.0–34.0)
MCHC: 33.2 g/dL (ref 30.0–36.0)
MCV: 85.9 fL (ref 80.0–100.0)
Platelets: 264 10*3/uL (ref 150–400)
RBC: 2.84 MIL/uL — ABNORMAL LOW (ref 3.87–5.11)
RDW: 15.9 % — ABNORMAL HIGH (ref 11.5–15.5)
WBC: 8.9 10*3/uL (ref 4.0–10.5)
nRBC: 0 % (ref 0.0–0.2)

## 2020-10-17 LAB — BASIC METABOLIC PANEL
Anion gap: 8 (ref 5–15)
BUN: 27 mg/dL — ABNORMAL HIGH (ref 8–23)
CO2: 23 mmol/L (ref 22–32)
Calcium: 8.7 mg/dL — ABNORMAL LOW (ref 8.9–10.3)
Chloride: 107 mmol/L (ref 98–111)
Creatinine, Ser: 1.23 mg/dL — ABNORMAL HIGH (ref 0.44–1.00)
GFR, Estimated: 46 mL/min — ABNORMAL LOW (ref >60)
Glucose, Bld: 206 mg/dL — ABNORMAL HIGH (ref 70–99)
Potassium: 3.9 mmol/L (ref 3.5–5.1)
Sodium: 138 mmol/L (ref 135–145)

## 2020-10-17 LAB — GLUCOSE, CAPILLARY
Glucose-Capillary: 141 mg/dL — ABNORMAL HIGH (ref 70–99)
Glucose-Capillary: 166 mg/dL — ABNORMAL HIGH (ref 70–99)
Glucose-Capillary: 193 mg/dL — ABNORMAL HIGH (ref 70–99)
Glucose-Capillary: 139 mg/dL — ABNORMAL HIGH (ref 70–99)

## 2020-10-17 NOTE — Progress Notes (Signed)
2 Days Post-Op  Subjective: On my examination, patient is resting comfortably in bed.  She had just been seen by LCSW G[place].  She tells me that she is mildly discouraged about her options for SNF placement, but understands and agrees that this is still very much in her best interest.  She reports that she has family visiting her later this evening.  She states that physically she feels quite well.  She denies any fevers, chills, difficulty eating or drinking, nausea or emesis, or malaise.  She states that she feels more energized since her irrigation and debridement 10/15/2020.  Most recent bowel movement was earlier today, denies melena.  She states that she is having less drainage in her JP drains compared to yesterday.   Objective: Vital signs in last 24 hours: Temp:  [97.6 F (36.4 C)-98.5 F (36.9 C)] 98 F (36.7 C) (08/24 1215) Pulse Rate:  [62-68] 67 (08/24 1215) Resp:  [16-18] 18 (08/24 1215) BP: (129-155)/(53-70) 132/53 (08/24 1215) SpO2:  [98 %-99 %] 98 % (08/24 1215) Last BM Date: 10/14/20  Intake/Output from previous day: 08/23 0701 - 08/24 0700 In: 360 [P.O.:360] Out: 7 [Drains:7] Intake/Output this shift: Total I/O In: 240 [P.O.:240] Out: 830 [Urine:750; Drains:80]  General appearance: alert GI: Soft, no distention.  Mild tenderness in area of incision.  Wound VAC in place.  No obvious erythema or induration around incision site.  Draining appropriately. Extremities: no edema, redness or tenderness in the calves or thighs Pulses: 2+ and symmetric Normal pinkish drainage in JP drains  Lab Results:  CBC Latest Ref Rng & Units 10/17/2020 10/16/2020 10/15/2020  WBC 4.0 - 10.5 K/uL 8.9 9.3 -  Hemoglobin 12.0 - 15.0 g/dL 8.1(L) 8.4(L) 8.1(L)  Hematocrit 36.0 - 46.0 % 24.4(L) 24.9(L) 23.9(L)  Platelets 150 - 400 K/uL 264 241 -    BMET Recent Labs    10/16/20 0111 10/17/20 0146  NA 135 138  K 4.0 3.9  CL 106 107  CO2 21* 23  GLUCOSE 286* 206*  BUN 24* 27*   CREATININE 1.39* 1.23*  CALCIUM 8.5* 8.7*   PT/INR No results for input(s): LABPROT, INR in the last 72 hours. ABG No results for input(s): PHART, HCO3 in the last 72 hours.  Invalid input(s): PCO2, PO2  Studies/Results: No results found.  Anti-infectives: Anti-infectives (From admission, onward)    Start     Dose/Rate Route Frequency Ordered Stop   10/15/20 1255  ceFAZolin 1 g / gentamicin 80 mg in NS 500 mL surgical irrigation  Status:  Discontinued          As needed 10/15/20 1255 10/15/20 1441   10/15/20 0745  ceFAZolin (ANCEF) IVPB 2g/100 mL premix        2 g 200 mL/hr over 30 Minutes Intravenous On call to O.R. 10/15/20 [id number] 10/15/20 1325   10/13/20 1600  cefTRIAXone (ROCEPHIN) 2 g in sodium chloride 0.9 % 100 mL IVPB        2 g 200 mL/hr over 30 Minutes Intravenous Every 24 hours 10/12/20 1606     10/13/20 1400  vancomycin (VANCOREADY) IVPB 750 mg/150 mL        750 mg 150 mL/hr over 60 Minutes Intravenous Every 24 hours 10/12/20 1314     10/13/20 0000  clindamycin (CLEOCIN) IVPB 900 mg  Status:  Discontinued        900 mg 100 mL/hr over 30 Minutes Intravenous Every 8 hours 10/12/20 1626 10/16/20 0916   10/12/20 2200  clindamycin (CLEOCIN)  IVPB 600 mg  Status:  Discontinued        600 mg 100 mL/hr over 30 Minutes Intravenous Every 8 hours 10/12/20 1606 10/12/20 1626   10/12/20 1530  clindamycin (CLEOCIN) IVPB 600 mg        600 mg 100 mL/hr over 30 Minutes Intravenous  Once 10/12/20 1519 10/12/20 1935   10/12/20 1200  vancomycin (VANCOREADY) IVPB 1500 mg/300 mL        1,500 mg 150 mL/hr over 120 Minutes Intravenous  Once 10/12/20 1153 10/12/20 1743   10/12/20 1200  cefTRIAXone (ROCEPHIN) 2 g in sodium chloride 0.9 % 100 mL IVPB        2 g 200 mL/hr over 30 Minutes Intravenous  Once 10/12/20 1153 10/12/20 1253       Assessment/Plan: s/p Procedure(s): IRRIGATION AND DEBRIDEMENT WOUND    Patient is a 75 year old female s/p debridement and irrigation of abdomen.   She had a panniculectomy on 09/26/2020 and was subsequently admitted for symptomatic anemia and sepsis.  She feels improved after PRBCs and IV antibiotics.  She is well-appearing today, states that she feels considerably improved.  Patient endorses eating and drinking well.  Her exam is reassuring.   -Leave wound VAC in place for an additional 6 days. -Continue to monitor JP drain output.  Drainage appears normal on my exam today.  Plan to leave in place for an additional 10 to 14 days. -Antibiotics per primary team. -Continue with SCDs for DVT prophylaxis.    LOS: 5 days    Krista Blue, PA-C 10/17/2020

## 2020-10-17 NOTE — Progress Notes (Signed)
Pt Left AC IV infiltrated while abx was running. Removed IV. Pt Right AC IV occluded when trying to flush it. Removed IV.  RN and CN assessed pt for IV access. Arms are slightly swollen and pt veins are difficult to find.  Placed IV Team consult.

## 2020-10-17 NOTE — Progress Notes (Signed)
PROGRESS NOTE   Teresa Golden  [id number]    DOB: 1945/02/26    DOA: 10/12/2020  PCP: Glendale Chard, MD   I have briefly reviewed patients previous medical records in [facility].  Chief Complaint  Patient presents with   Wound Infection    Brief Narrative:  75 year old female with medical history significant for hypertension, DM2, hypothyroidism and obesity presents with suspected postop wound infection.  She had elective panniculectomy performed on 8/3 by Dr. Tedra Coupe him, plastic surgery and was found to have a supraumbilical hernia which was repaired by Dr. Quinn Plowman of general surgery.  She now presented with subjective fevers, chills and malaise and 3 to 400 mL output per day from the drain of brownish-yellow fluid.  She had gone to the plastic surgery office for routine follow-up and was advised to come to the hospital for further evaluation.  In the ED febrile up to 100.6 F.  Started empirically on antibiotics.  Plastic surgery was consulted, suspected more of her dehiscence than infection and underwent debridement of open incision in OR on 8/22.   Assessment & Plan:  Principal Problem:   Wound infection after surgery Active Problems:   Hypothyroidism   Type 2 diabetes mellitus with stage 3 chronic kidney disease, with long-term current use of insulin (HCC)   Normocytic anemia   AKI (acute kidney injury) ([place])   Obesity (BMI 30-39.9)   Sepsis ([place])   Sepsis secondary to postoperative wound infection: S/p panniculectomy on 8/3 by Dr. Marla Roe.  Febrile up to 101.3 with tachycardia and tachypnea.  WBC 4.5 with left shift but lactate normal.  CT abdomen and pelvis significant for postsurgical changes with concern for subcutaneous gas to rule out potential necrotizing fasciitis.  Started empirically on IV vancomycin, Rocephin and clindamycin.  Plastic surgery was consulted and underwent debridement in OR on 8/23. Per plastic surgery follow-up, would benefit from SNF  placement at discharge, leave wound VAC in place x1 week, may change at bedside to remove at bedside next week, likely to leave JP drain in place for the next 1 to 2 weeks.  Clindamycin discontinued.  Remains on IV ceftriaxone and vancomycin.  Discussed with Dr. Marla Roe, okay to switch to oral antibiotics for additional 7 days, okay to DC to SNF with wound VAC and outpatient follow-up in 1 week.  Will review with ID in a.m. regarding choices.    Anemia: S/p 2 units PRBC transfusion thus far this admission.  Hemoglobin up to 8.1 >8.4 >8.1.  Follow CBC daily and transfuse if hemoglobin 7 g or less.  Essential hypertension: Controlled.  Continue home dose of amlodipine, losartan and Toprol-XL.  Hypothyroidism: Continue Synthroid.  Type II DM: A1c 6.4 on 7/26.  Continue with SSI.  Increased Lantus from 6 to 9 units at bedtime which is her home dose.  Improved control today.  Acute kidney injury: Creatinine has improved from 1.39-1.23.  Trend BMP.  Body mass index is 35.61 kg/m.   DVT prophylaxis: SCDs Start: 10/12/20 1602     Code Status: Full Code Family Communication: None at bedside. Disposition:  Status is: Inpatient  Remains inpatient appropriate because:Inpatient level of care appropriate due to severity of illness  Dispo: The patient is from: Home              Anticipated d/c is to: SNF              Patient currently medically stable for DC pending SNF bed.   Difficult  to place patient No        Consultants:   Plastic surgery  Procedures:   Debridement of postop abdominal wound in OR 8/22.  Antimicrobials:    Anti-infectives (From admission, onward)    Start     Dose/Rate Route Frequency Ordered Stop   10/15/20 1255  ceFAZolin 1 g / gentamicin 80 mg in NS 500 mL surgical irrigation  Status:  Discontinued          As needed 10/15/20 1255 10/15/20 1441   10/15/20 0745  ceFAZolin (ANCEF) IVPB 2g/100 mL premix        2 g 200 mL/hr over 30 Minutes Intravenous On call  to O.R. 10/15/20 [id number] 10/15/20 1325   10/13/20 1600  cefTRIAXone (ROCEPHIN) 2 g in sodium chloride 0.9 % 100 mL IVPB        2 g 200 mL/hr over 30 Minutes Intravenous Every 24 hours 10/12/20 1606     10/13/20 1400  vancomycin (VANCOREADY) IVPB 750 mg/150 mL        750 mg 150 mL/hr over 60 Minutes Intravenous Every 24 hours 10/12/20 1314     10/13/20 0000  clindamycin (CLEOCIN) IVPB 900 mg  Status:  Discontinued        900 mg 100 mL/hr over 30 Minutes Intravenous Every 8 hours 10/12/20 1626 10/16/20 0916   10/12/20 2200  clindamycin (CLEOCIN) IVPB 600 mg  Status:  Discontinued        600 mg 100 mL/hr over 30 Minutes Intravenous Every 8 hours 10/12/20 1606 10/12/20 1626   10/12/20 1530  clindamycin (CLEOCIN) IVPB 600 mg        600 mg 100 mL/hr over 30 Minutes Intravenous  Once 10/12/20 1519 10/12/20 1935   10/12/20 1200  vancomycin (VANCOREADY) IVPB 1500 mg/300 mL        1,500 mg 150 mL/hr over 120 Minutes Intravenous  Once 10/12/20 1153 10/12/20 1743   10/12/20 1200  cefTRIAXone (ROCEPHIN) 2 g in sodium chloride 0.9 % 100 mL IVPB        2 g 200 mL/hr over 30 Minutes Intravenous  Once 10/12/20 1153 10/12/20 1253         Subjective:  Not much abdominal pain.  Eating and drinking well.  Having BMs.  Many questions regarding SNF options and has been communicating with TOC team.  Objective:   Vitals:   10/17/20 0550 10/17/20 0750 10/17/20 1215 10/17/20 1555  BP: (!) 139/56 (!) 155/70 (!) 132/53 (!) 140/53  Pulse: 62 68 67 73  Resp: '16 18 18 18  '$ Temp: 97.8 F (36.6 C) 98.5 F (36.9 C) 98 F (36.7 C) 97.8 F (36.6 C)  TempSrc: Oral Oral Oral   SpO2: 99% 98% 98% 98%  Weight:      Height:        General exam: Elderly female, moderately built and obese lying comfortably in bed Respiratory system: Clear to auscultation.  No increased work of breathing. Cardiovascular system: S1 & S2 heard, RRR. No JVD, murmurs, rubs, gallops or clicks. No pedal edema.  Not on  telemetry. Gastrointestinal system: Abdomen is nondistended, soft and nontender. No organomegaly or masses felt. Normal bowel sounds heard.  Wound VAC across lower abdomen. Central nervous system: Alert and oriented. No focal neurological deficits. Extremities: Symmetric 5 x 5 power. Skin: No rashes, lesions or ulcers Psychiatry: Judgement and insight appear normal. Mood & affect appropriate.     Data Reviewed:   I have personally reviewed following labs and imaging  studies   CBC: Recent Labs  Lab 10/12/20 1205 10/12/20 2131 10/14/20 0116 10/15/20 0038 10/16/20 0111 10/17/20 0146  WBC 4.5   < > 6.1  --  9.3 8.9  NEUTROABS 2.2  --   --   --   --   --   HGB 7.3*   < > 7.1* 8.1* 8.4* 8.1*  HCT 22.7*   < > 21.8* 23.9* 24.9* 24.4*  MCV 87.6   < > 85.2  --  85.0 85.9  PLT 287   < > 241  --  241 264   < > = values in this interval not displayed.    Basic Metabolic Panel: Recent Labs  Lab 10/15/20 0728 10/16/20 0111 10/17/20 0146  NA 137 135 138  K 3.7 4.0 3.9  CL 106 106 107  CO2 23 21* 23  GLUCOSE 115* 286* 206*  BUN 22 24* 27*  CREATININE 1.27* 1.39* 1.23*  CALCIUM 8.3* 8.5* 8.7*    Liver Function Tests: Recent Labs  Lab 10/12/20 1205  AST 21  ALT 13  ALKPHOS 64  BILITOT 0.8  PROT 6.0*  ALBUMIN 2.4*    CBG: Recent Labs  Lab 10/17/20 0746 10/17/20 1216 10/17/20 1642  GLUCAP 141* 166* 193*    Microbiology Studies:   Recent Results (from the past 240 hour(s))  Resp Panel by RT-PCR (Flu A&B, Covid) Nasopharyngeal Swab     Status: None   Collection Time: 10/12/20 12:05 PM   Specimen: Nasopharyngeal Swab; Nasopharyngeal(NP) swabs in vial transport medium  Result Value Ref Range Status   SARS Coronavirus 2 by RT PCR NEGATIVE NEGATIVE Final    Comment: (NOTE) SARS-CoV-2 target nucleic acids are NOT DETECTED.  The SARS-CoV-2 RNA is generally detectable in upper respiratory specimens during the acute phase of infection. The lowest concentration of  SARS-CoV-2 viral copies this assay can detect is 138 copies/mL. A negative result does not preclude SARS-Cov-2 infection and should not be used as the sole basis for treatment or other patient management decisions. A negative result may occur with  improper specimen collection/handling, submission of specimen other than nasopharyngeal swab, presence of viral mutation(s) within the areas targeted by this assay, and inadequate number of viral copies(<138 copies/mL). A negative result must be combined with clinical observations, patient history, and epidemiological information. The expected result is Negative.  Fact Sheet for Patients:  EntrepreneurPulse.com.au  Fact Sheet for Healthcare Providers:  IncredibleEmployment.be  This test is no t yet approved or cleared by the [place] FDA and  has been authorized for detection and/or diagnosis of SARS-CoV-2 by FDA under an Emergency Use Authorization (EUA). This EUA will remain  in effect (meaning this test can be used) for the duration of the COVID-19 declaration under Section 564(b)(1) of the Act, 21 U.S.C.section 360bbb-3(b)(1), unless the authorization is terminated  or revoked sooner.       Influenza A by PCR NEGATIVE NEGATIVE Final   Influenza B by PCR NEGATIVE NEGATIVE Final    Comment: (NOTE) The Xpert Xpress SARS-CoV-2/FLU/RSV plus assay is intended as an aid in the diagnosis of influenza from Nasopharyngeal swab specimens and should not be used as a sole basis for treatment. Nasal washings and aspirates are unacceptable for Xpert Xpress SARS-CoV-2/FLU/RSV testing.  Fact Sheet for Patients: EntrepreneurPulse.com.au  Fact Sheet for Healthcare Providers: IncredibleEmployment.be  This test is not yet approved or cleared by the [place] FDA and has been authorized for detection and/or diagnosis of SARS-CoV-2 by FDA under an Emergency  Use  Authorization (EUA). This EUA will remain in effect (meaning this test can be used) for the duration of the COVID-19 declaration under Section 564(b)(1) of the Act, 21 U.S.C. section 360bbb-3(b)(1), unless the authorization is terminated or revoked.  Performed at [facility], [place] [address], [place], [place] [address]   Blood Culture (routine x 2)     Status: None   Collection Time: 10/12/20 12:05 PM   Specimen: BLOOD  Result Value Ref Range Status   Specimen Description BLOOD SITE NOT SPECIFIED  Final   Special Requests   Final    BOTTLES DRAWN AEROBIC AND ANAEROBIC Blood Culture results may not be optimal due to an inadequate volume of blood received in culture bottles   Culture   Final    NO GROWTH 5 DAYS Performed at [facility], [place] [address], [place], [place] [address]    Report Status 10/17/2020 FINAL  Final  Blood Culture (routine x 2)     Status: None   Collection Time: 10/12/20 12:05 PM   Specimen: BLOOD  Result Value Ref Range Status   Specimen Description BLOOD SITE NOT SPECIFIED  Final   Special Requests   Final    BOTTLES DRAWN AEROBIC AND ANAEROBIC Blood Culture results may not be optimal due to an inadequate volume of blood received in culture bottles   Culture   Final    NO GROWTH 5 DAYS Performed at [facility], [place] [address] [place], [place] [address]    Report Status 10/17/2020 FINAL  Final  Surgical pcr screen     Status: Abnormal   Collection Time: 10/14/20  8:57 PM   Specimen: Nasal Mucosa; Nasal Swab  Result Value Ref Range Status   MRSA, PCR POSITIVE (A) NEGATIVE Final    Comment: RESULT CALLED TO, READ BACK BY AND VERIFIED WITH: C SISON RN 10/14/20 2306 JDW    Staphylococcus aureus POSITIVE (A) NEGATIVE Final    Comment: (NOTE) The Xpert SA Assay (FDA approved for NASAL specimens in patients 12 years of age and older), is one component of a comprehensive surveillance program. It is not intended to diagnose infection nor  to guide or monitor treatment. Performed at [facility], [place] [address], [place], [place] [address]      Radiology Studies:  No results found.   Scheduled Meds:    amLODipine  5 mg Oral Daily   Chlorhexidine Gluconate Cloth  6 each Topical Daily   enoxaparin (LOVENOX) injection  50 mg Subcutaneous Daily   gabapentin  600 mg Oral QHS   insulin aspart  0-15 Units Subcutaneous TID WC   insulin aspart  0-5 Units Subcutaneous QHS   insulin glargine-yfgn  9 Units Subcutaneous QHS   levothyroxine  12.5 mcg Oral Once per day on Mon Tue Wed Thu Fri   levothyroxine  25 mcg Oral Once per day on Sun Sat   losartan  100 mg Oral Daily   metoprolol succinate  25 mg Oral QHS   mupirocin ointment  1 application Nasal BID    Continuous Infusions:    cefTRIAXone (ROCEPHIN)  IV 2 g (10/16/20 1624)   vancomycin 750 mg (10/16/20 1744)     LOS: 5 days     Vernell Leep, MD, [place], [facility]. [facility]    To contact the attending provider between 7A-7P or the covering provider during after hours 7P-7A, please log into the web site [email or web address] and access using universal [place]  password for that web site. If you do not have the password, please call the hospital operator.  10/17/2020, 4:57 PM

## 2020-10-17 NOTE — Progress Notes (Signed)
Pt JP drain stitch is not intact with skin.  Paged G. Nyoka Cowden, PA. He called back and stated to tape drain in place and they will come to assess in the morning.  Drain taped in place.

## 2020-10-18 DIAGNOSIS — T8149XA Infection following a procedure, other surgical site, initial encounter: Secondary | ICD-10-CM | POA: Diagnosis not present

## 2020-10-18 DIAGNOSIS — M109 Gout, unspecified: Secondary | ICD-10-CM

## 2020-10-18 LAB — BASIC METABOLIC PANEL
Anion gap: 7 (ref 5–15)
BUN: 20 mg/dL (ref 8–23)
CO2: 23 mmol/L (ref 22–32)
Calcium: 8.9 mg/dL (ref 8.9–10.3)
Chloride: 109 mmol/L (ref 98–111)
Creatinine, Ser: 1.01 mg/dL — ABNORMAL HIGH (ref 0.44–1.00)
GFR, Estimated: 58 mL/min — ABNORMAL LOW (ref >60)
Glucose, Bld: 143 mg/dL — ABNORMAL HIGH (ref 70–99)
Potassium: 4.1 mmol/L (ref 3.5–5.1)
Sodium: 139 mmol/L (ref 135–145)

## 2020-10-18 LAB — CBC
HCT: 24.6 % — ABNORMAL LOW (ref 36.0–46.0)
Hemoglobin: 8.2 g/dL — ABNORMAL LOW (ref 12.0–15.0)
MCH: 28.5 pg (ref 26.0–34.0)
MCHC: 33.3 g/dL (ref 30.0–36.0)
MCV: 85.4 fL (ref 80.0–100.0)
Platelets: 285 10*3/uL (ref 150–400)
RBC: 2.88 MIL/uL — ABNORMAL LOW (ref 3.87–5.11)
RDW: 15.9 % — ABNORMAL HIGH (ref 11.5–15.5)
WBC: 9 10*3/uL (ref 4.0–10.5)
nRBC: 0 % (ref 0.0–0.2)

## 2020-10-18 LAB — GLUCOSE, CAPILLARY
Glucose-Capillary: 202 mg/dL — ABNORMAL HIGH (ref 70–99)
Glucose-Capillary: 173 mg/dL — ABNORMAL HIGH (ref 70–99)
Glucose-Capillary: 188 mg/dL — ABNORMAL HIGH (ref 70–99)
Glucose-Capillary: 186 mg/dL — ABNORMAL HIGH (ref 70–99)

## 2020-10-18 MED ORDER — COLCHICINE 0.6 MG PO TABS
1.2000 mg | ORAL_TABLET | Freq: Once | ORAL | Status: AC
  Administered 2020-10-18: 1.2 mg via ORAL
  Filled 2020-10-18: qty 2

## 2020-10-18 MED ORDER — COLCHICINE 0.6 MG PO TABS
0.6000 mg | ORAL_TABLET | Freq: Once | ORAL | Status: AC
  Administered 2020-10-18: 0.6 mg via ORAL
  Filled 2020-10-18: qty 1

## 2020-10-18 MED ORDER — DOXYCYCLINE HYCLATE 100 MG PO TABS
100.0000 mg | ORAL_TABLET | Freq: Two times a day (BID) | ORAL | Status: DC
  Administered 2020-10-18 – 2020-10-22 (×8): 100 mg via ORAL
  Filled 2020-10-18 (×8): qty 1

## 2020-10-18 MED ORDER — AMOXICILLIN-POT CLAVULANATE 875-125 MG PO TABS
1.0000 | ORAL_TABLET | Freq: Two times a day (BID) | ORAL | Status: DC
Start: 2020-10-18 — End: 2020-10-23
  Administered 2020-10-18 – 2020-10-22 (×8): 1 via ORAL
  Filled 2020-10-18 (×8): qty 1

## 2020-10-18 NOTE — Progress Notes (Signed)
PROGRESS NOTE   Teresa Golden  [id number]    DOB: 10/20/1945    DOA: 10/12/2020  PCP: Glendale Chard, MD   I have briefly reviewed patients previous medical records in [facility].  Chief Complaint  Patient presents with   Wound Infection    Brief Narrative:  75 year old female with medical history significant for hypertension, DM2, hypothyroidism and obesity presents with suspected postop wound infection.  She had elective panniculectomy performed on 8/3 by Dr. Tedra Coupe him, plastic surgery and was found to have a supraumbilical hernia which was repaired by Dr. Quinn Plowman of general surgery.  She now presented with subjective fevers, chills and malaise and 3 to 400 mL output per day from the drain of brownish-yellow fluid.  She had gone to the plastic surgery office for routine follow-up and was advised to come to the hospital for further evaluation.  In the ED febrile up to 100.6 F.  Started empirically on antibiotics.  Plastic surgery was consulted, suspected more of her dehiscence than infection and underwent debridement of open incision in OR on 8/22.  Right ankle gout flare.   Assessment & Plan:  Principal Problem:   Wound infection after surgery Active Problems:   Hypothyroidism   Type 2 diabetes mellitus with stage 3 chronic kidney disease, with long-term current use of insulin (HCC)   Normocytic anemia   AKI (acute kidney injury) ([place])   Obesity (BMI 30-39.9)   Sepsis ([place])   Sepsis secondary to postoperative wound infection:  S/p panniculectomy on 8/3 by Dr. Marla Roe.  Febrile up to 101.3 with tachycardia and tachypnea.  WBC 4.5 with left shift but lactate normal.  CT abdomen and pelvis significant for postsurgical changes with concern for subcutaneous gas to rule out potential necrotizing fasciitis.  Started empirically on IV vancomycin, Rocephin and clindamycin.  Plastic surgery was consulted and underwent debridement in OR on 8/23.  Per plastic surgery  follow-up: Wound VAC for additional 5 days, JP drain to be left for 10 to 14 days, additional week of oral antibiotics, recommend SNF at discharge and outpatient follow-up with them in 1 week.  On IV antibiotics since 8/19 (IV ceftriaxone 8/19 >, IV vancomycin 8/19 >, IV clindamycin 8/19-8/22).  Discussed with ID regarding choice of oral antibiotics,?  Augmentin + doxycycline.  Blood cultures x2: Negative.  No surgical cultures to guide.  On 4/25: JP drain partially out and mild bleeding/oozing from right side groin.  Communicated with plastics, will see later today.  Pressure dressing for now.  Acute gout right ankle: History of gout.  Reports taking as needed colchicine for flares.  Initiated colchicine.  Supportive care.  Monitor.  Anemia: S/p 2 units PRBC transfusion thus far this admission.  Hemoglobin up to 8.1 >8.4 >8.1 >8.2.  Follow CBC daily and transfuse if hemoglobin 7 g or less.    Essential hypertension: Controlled.  Continue home dose of amlodipine, losartan and Toprol-XL.  Hypothyroidism: Continue Synthroid.  Type II DM: A1c 6.4 on 7/26.  Continue with SSI.  Increased Lantus from 6 to 9 units at bedtime which is her home dose.  Improved control since adjusting dose.  Acute kidney injury: Resolved.  Body mass index is 35.61 kg/m.   DVT prophylaxis: SCDs Start: 10/12/20 1602     Code Status: Full Code Family Communication: None at bedside. Disposition:  Status is: Inpatient  Remains inpatient appropriate because:Inpatient level of care appropriate due to severity of illness  Dispo: The patient is from: Home  Anticipated d/c is to: SNF              Patient currently medically stable for DC pending SNF bed.   Difficult to place patient No        Consultants:   Plastic surgery  Procedures:   Debridement of postop abdominal wound in OR 8/22.  Antimicrobials:    Anti-infectives (From admission, onward)    Start     Dose/Rate Route Frequency Ordered  Stop   10/15/20 1255  ceFAZolin 1 g / gentamicin 80 mg in NS 500 mL surgical irrigation  Status:  Discontinued          As needed 10/15/20 1255 10/15/20 1441   10/15/20 0745  ceFAZolin (ANCEF) IVPB 2g/100 mL premix        2 g 200 mL/hr over 30 Minutes Intravenous On call to O.R. 10/15/20 [id number] 10/15/20 1325   10/13/20 1600  cefTRIAXone (ROCEPHIN) 2 g in sodium chloride 0.9 % 100 mL IVPB        2 g 200 mL/hr over 30 Minutes Intravenous Every 24 hours 10/12/20 1606     10/13/20 1400  vancomycin (VANCOREADY) IVPB 750 mg/150 mL        750 mg 150 mL/hr over 60 Minutes Intravenous Every 24 hours 10/12/20 1314     10/13/20 0000  clindamycin (CLEOCIN) IVPB 900 mg  Status:  Discontinued        900 mg 100 mL/hr over 30 Minutes Intravenous Every 8 hours 10/12/20 1626 10/16/20 0916   10/12/20 2200  clindamycin (CLEOCIN) IVPB 600 mg  Status:  Discontinued        600 mg 100 mL/hr over 30 Minutes Intravenous Every 8 hours 10/12/20 1606 10/12/20 1626   10/12/20 1530  clindamycin (CLEOCIN) IVPB 600 mg        600 mg 100 mL/hr over 30 Minutes Intravenous  Once 10/12/20 1519 10/12/20 1935   10/12/20 1200  vancomycin (VANCOREADY) IVPB 1500 mg/300 mL        1,500 mg 150 mL/hr over 120 Minutes Intravenous  Once 10/12/20 1153 10/12/20 1743   10/12/20 1200  cefTRIAXone (ROCEPHIN) 2 g in sodium chloride 0.9 % 100 mL IVPB        2 g 200 mL/hr over 30 Minutes Intravenous  Once 10/12/20 1153 10/12/20 1253         Subjective:  Evaluated patient along with her RN and friend at bedside.  Per RN, right sided JP drain is partially out compared to last evening and also mild blood oozing from right side of groin.  Mild to moderate appropriate postop pain.  Moderate to severe right ankle pain, unable to weight-bear due to pain with OT.  Objective:   Vitals:   10/18/20 0036 10/18/20 0336 10/18/20 0841 10/18/20 1228  BP: (!) 148/61 (!) 169/64 (!) 132/56 (!) 153/55  Pulse: 74 75 78 66  Resp: '17 17 18 18  '$ Temp: 98.9  F (37.2 C) 97.7 F (36.5 C) 99.1 F (37.3 C) 98.4 F (36.9 C)  TempSrc: Oral Oral Oral Oral  SpO2: 99% 97% 99% 99%  Weight:      Height:        General exam: Elderly female, moderately built and obese lying comfortably in bed Respiratory system: Clear to auscultation.  No increased work of breathing. Cardiovascular system: S1 & S2 heard, RRR. No JVD, murmurs, rubs, gallops or clicks. No pedal edema.  Not on telemetry. Gastrointestinal system: Abdomen is nondistended, soft and nontender. No organomegaly  or masses felt. Normal bowel sounds heard.  Noticed right side of groin JP drain partially out with small amount of bloody fluid in bulb.  Also minimal oozing of blood under wound VAC on right side of groin.  Otherwise no acute abdominal findings. Central nervous system: Alert and oriented. No focal neurological deficits. Extremities: Symmetric 5 x 5 power.  Right ankle mildly warm, tender to touch and extremely painful range of movements.  No erythema. Skin: No rashes, lesions or ulcers Psychiatry: Judgement and insight appear normal. Mood & affect appropriate.     Data Reviewed:   I have personally reviewed following labs and imaging studies   CBC: Recent Labs  Lab 10/12/20 1205 10/12/20 2131 10/16/20 0111 10/17/20 0146 10/18/20 0047  WBC 4.5   < > 9.3 8.9 9.0  NEUTROABS 2.2  --   --   --   --   HGB 7.3*   < > 8.4* 8.1* 8.2*  HCT 22.7*   < > 24.9* 24.4* 24.6*  MCV 87.6   < > 85.0 85.9 85.4  PLT 287   < > 241 264 285   < > = values in this interval not displayed.    Basic Metabolic Panel: Recent Labs  Lab 10/16/20 0111 10/17/20 0146 10/18/20 0047  NA 135 138 139  K 4.0 3.9 4.1  CL 106 107 109  CO2 21* 23 23  GLUCOSE 286* 206* 143*  BUN 24* 27* 20  CREATININE 1.39* 1.23* 1.01*  CALCIUM 8.5* 8.7* 8.9    Liver Function Tests: Recent Labs  Lab 10/12/20 1205  AST 21  ALT 13  ALKPHOS 64  BILITOT 0.8  PROT 6.0*  ALBUMIN 2.4*    CBG: Recent Labs  Lab  10/17/20 2101 10/18/20 0850 10/18/20 1235  GLUCAP 139* 202* 173*    Microbiology Studies:   Recent Results (from the past 240 hour(s))  Resp Panel by RT-PCR (Flu A&B, Covid) Nasopharyngeal Swab     Status: None   Collection Time: 10/12/20 12:05 PM   Specimen: Nasopharyngeal Swab; Nasopharyngeal(NP) swabs in vial transport medium  Result Value Ref Range Status   SARS Coronavirus 2 by RT PCR NEGATIVE NEGATIVE Final    Comment: (NOTE) SARS-CoV-2 target nucleic acids are NOT DETECTED.  The SARS-CoV-2 RNA is generally detectable in upper respiratory specimens during the acute phase of infection. The lowest concentration of SARS-CoV-2 viral copies this assay can detect is 138 copies/mL. A negative result does not preclude SARS-Cov-2 infection and should not be used as the sole basis for treatment or other patient management decisions. A negative result may occur with  improper specimen collection/handling, submission of specimen other than nasopharyngeal swab, presence of viral mutation(s) within the areas targeted by this assay, and inadequate number of viral copies(<138 copies/mL). A negative result must be combined with clinical observations, patient history, and epidemiological information. The expected result is Negative.  Fact Sheet for Patients:  EntrepreneurPulse.com.au  Fact Sheet for Healthcare Providers:  IncredibleEmployment.be  This test is no t yet approved or cleared by the [place] FDA and  has been authorized for detection and/or diagnosis of SARS-CoV-2 by FDA under an Emergency Use Authorization (EUA). This EUA will remain  in effect (meaning this test can be used) for the duration of the COVID-19 declaration under Section 564(b)(1) of the Act, 21 U.S.C.section 360bbb-3(b)(1), unless the authorization is terminated  or revoked sooner.       Influenza A by PCR NEGATIVE NEGATIVE Final   Influenza B  by PCR NEGATIVE  NEGATIVE Final    Comment: (NOTE) The Xpert Xpress SARS-CoV-2/FLU/RSV plus assay is intended as an aid in the diagnosis of influenza from Nasopharyngeal swab specimens and should not be used as a sole basis for treatment. Nasal washings and aspirates are unacceptable for Xpert Xpress SARS-CoV-2/FLU/RSV testing.  Fact Sheet for Patients: EntrepreneurPulse.com.au  Fact Sheet for Healthcare Providers: IncredibleEmployment.be  This test is not yet approved or cleared by the [place] FDA and has been authorized for detection and/or diagnosis of SARS-CoV-2 by FDA under an Emergency Use Authorization (EUA). This EUA will remain in effect (meaning this test can be used) for the duration of the COVID-19 declaration under Section 564(b)(1) of the Act, 21 U.S.C. section 360bbb-3(b)(1), unless the authorization is terminated or revoked.  Performed at [facility], [place] [address], [place], [place] [address]   Blood Culture (routine x 2)     Status: None   Collection Time: 10/12/20 12:05 PM   Specimen: BLOOD  Result Value Ref Range Status   Specimen Description BLOOD SITE NOT SPECIFIED  Final   Special Requests   Final    BOTTLES DRAWN AEROBIC AND ANAEROBIC Blood Culture results may not be optimal due to an inadequate volume of blood received in culture bottles   Culture   Final    NO GROWTH 5 DAYS Performed at [facility], [place] [address], [place], [place] [address]    Report Status 10/17/2020 FINAL  Final  Blood Culture (routine x 2)     Status: None   Collection Time: 10/12/20 12:05 PM   Specimen: BLOOD  Result Value Ref Range Status   Specimen Description BLOOD SITE NOT SPECIFIED  Final   Special Requests   Final    BOTTLES DRAWN AEROBIC AND ANAEROBIC Blood Culture results may not be optimal due to an inadequate volume of blood received in culture bottles   Culture   Final    NO GROWTH 5 DAYS Performed at [facility],  [place] [address] [place], [place] [address]    Report Status 10/17/2020 FINAL  Final  Surgical pcr screen     Status: Abnormal   Collection Time: 10/14/20  8:57 PM   Specimen: Nasal Mucosa; Nasal Swab  Result Value Ref Range Status   MRSA, PCR POSITIVE (A) NEGATIVE Final    Comment: RESULT CALLED TO, READ BACK BY AND VERIFIED WITH: C SISON RN 10/14/20 2306 JDW    Staphylococcus aureus POSITIVE (A) NEGATIVE Final    Comment: (NOTE) The Xpert SA Assay (FDA approved for NASAL specimens in patients 58 years of age and older), is one component of a comprehensive surveillance program. It is not intended to diagnose infection nor to guide or monitor treatment. Performed at [facility], [place] [address], [place], [place] [address]      Radiology Studies:  No results found.   Scheduled Meds:    amLODipine  5 mg Oral Daily   Chlorhexidine Gluconate Cloth  6 each Topical Daily   colchicine  0.6 mg Oral Once   enoxaparin (LOVENOX) injection  50 mg Subcutaneous Daily   gabapentin  600 mg Oral QHS   insulin aspart  0-15 Units Subcutaneous TID WC   insulin aspart  0-5 Units Subcutaneous QHS   insulin glargine-yfgn  9 Units Subcutaneous QHS   levothyroxine  12.5 mcg Oral Once per day on Mon Tue Wed Thu Fri   levothyroxine  25 mcg Oral Once per  day on Sun Sat   losartan  100 mg Oral Daily   metoprolol succinate  25 mg Oral QHS   mupirocin ointment  1 application Nasal BID    Continuous Infusions:    cefTRIAXone (ROCEPHIN)  IV 2 g (10/17/20 1713)   vancomycin 750 mg (10/17/20 2110)     LOS: 6 days     Vernell Leep, MD, [place], [facility]. [facility]    To contact the attending provider between 7A-7P or the covering provider during after hours 7P-7A, please log into the web site [email or web address] and access using universal  password for that web site. If you do not have the password, please call the hospital operator.  10/18/2020, 12:55 PM

## 2020-10-18 NOTE — TOC Progression Note (Signed)
Transition of Care [facility]) - Progression Note    Patient Details  Name: Teresa Golden MRN: [id number] Date of Birth: [birth year]  [facility]) CM/SW Contact  Emeterio Reeve, [place] Phone Number: 10/18/2020, 3:40 PM  Clinical Narrative:     Pt chose [facility] for SNF. CSW called and confirmed that they can accept pt at DC. [facility] will start [employer] today. [facility] is closed on the weekends and [facility] will not come back until Monday  or later.   Expected Discharge Plan: Skilled Nursing Facility Barriers to Discharge: Ship broker, Continued Medical Work up  Expected Discharge Plan and Services Expected Discharge Plan: [place] arrangements for the past 2 months: [place]                                       Social Determinants of Health (SDOH) Interventions    Readmission Risk Interventions No flowsheet data found.  Emeterio Reeve, [place] Clinical Social Worker [phone]

## 2020-10-18 NOTE — Progress Notes (Signed)
Pt stated she is afraid she will not get to go home after rehab. She is concerned about not being able to be independent.

## 2020-10-18 NOTE — Progress Notes (Signed)
Called to pt room by OT. Pt JP drain had come out further and was leaking serosanguinous fluid on bed pads.  Helped get pt back in bed. Dr. Algis Liming came in to round on pt as we were getting pt back on bed.  Applied pressure to JP drain site per MD. Dr. Algis Liming notified surgeon.  Pt assessment--no changes from previous assessment.

## 2020-10-18 NOTE — Progress Notes (Signed)
3 Days Post-Op  Subjective:  Our team was contacted by primary team given concern for JP drain out of position.  I had spoken with RN last evening, but was informed that the sutures were only 1 to 2 inches above the skin and I asked that they simply help secure it with tape and that we would see her today.  Evidently it has since shifted further out of place and there was considerable drainage onto patient's gown and chucks.   On my evaluation, patient is resting comfortably.  She complains predominantly of right foot/ankle discomfort.  Endorses history of gouty arthritis.  Primary team initiating treatment with colchicine.  She denies any significant abdominal complaints at this time.  No nausea.  Patient is still eating and drinking without difficulty.  No fevers or chills overnight.  Normal BMs.    Objective: Vital signs in last 24 hours: Temp:  [97.7 F (36.5 C)-99.1 F (37.3 C)] 98.4 F (36.9 C) (08/25 1228) Pulse Rate:  [66-78] 66 (08/25 1228) Resp:  [17-18] 18 (08/25 1228) BP: (132-169)/(53-73) 153/55 (08/25 1228) SpO2:  [97 %-99 %] 99 % (08/25 1228) Last BM Date: 10/14/20  Intake/Output from previous day: 08/24 0701 - 08/25 0700 In: 630 [P.O.:480; IV Piggyback:150] Out: 1055 [Urine:750; Drains:305] Intake/Output this shift: No intake/output data recorded.  General appearance: alert and no distress GI: Mild tenderness over surgical site.  Wound VAC appears in place.  No spreading erythema or induration.  Nondistended.  Part of the drainage end of JP drain was out of the skin. Extremities: Paper tape on right thigh.  No significant lower extremity swelling or edema.  SCDs were not hooked up.     Lab Results:  CBC Latest Ref Rng & Units 10/18/2020 10/17/2020 10/16/2020  WBC 4.0 - 10.5 K/uL 9.0 8.9 9.3  Hemoglobin 12.0 - 15.0 g/dL 8.2(L) 8.1(L) 8.4(L)  Hematocrit 36.0 - 46.0 % 24.6(L) 24.4(L) 24.9(L)  Platelets 150 - 400 K/uL 285 264 241    BMET Recent Labs     10/17/20 0146 10/18/20 0047  NA 138 139  K 3.9 4.1  CL 107 109  CO2 23 23  GLUCOSE 206* 143*  BUN 27* 20  CREATININE 1.23* 1.01*  CALCIUM 8.7* 8.9   PT/INR No results for input(s): LABPROT, INR in the last 72 hours. ABG No results for input(s): PHART, HCO3 in the last 72 hours.  Invalid input(s): PCO2, PO2  Studies/Results: No results found.  Anti-infectives: Anti-infectives (From admission, onward)    Start     Dose/Rate Route Frequency Ordered Stop   10/18/20 2200  doxycycline (VIBRA-TABS) tablet 100 mg        100 mg Oral Every 12 hours 10/18/20 1330 10/23/20 0959   10/18/20 2200  amoxicillin-clavulanate (AUGMENTIN) 875-125 MG per tablet 1 tablet        1 tablet Oral 2 times daily 10/18/20 1330 10/23/20 0959   10/15/20 1255  ceFAZolin 1 g / gentamicin 80 mg in NS 500 mL surgical irrigation  Status:  Discontinued          As needed 10/15/20 1255 10/15/20 1441   10/15/20 0745  ceFAZolin (ANCEF) IVPB 2g/100 mL premix        2 g 200 mL/hr over 30 Minutes Intravenous On call to O.R. 10/15/20 [id number] 10/15/20 1325   10/13/20 1600  cefTRIAXone (ROCEPHIN) 2 g in sodium chloride 0.9 % 100 mL IVPB  Status:  Discontinued        2 g 200 mL/hr over  30 Minutes Intravenous Every 24 hours 10/12/20 1606 10/18/20 1330   10/13/20 1400  vancomycin (VANCOREADY) IVPB 750 mg/150 mL  Status:  Discontinued        750 mg 150 mL/hr over 60 Minutes Intravenous Every 24 hours 10/12/20 1314 10/18/20 1330   10/13/20 0000  clindamycin (CLEOCIN) IVPB 900 mg  Status:  Discontinued        900 mg 100 mL/hr over 30 Minutes Intravenous Every 8 hours 10/12/20 1626 10/16/20 0916   10/12/20 2200  clindamycin (CLEOCIN) IVPB 600 mg  Status:  Discontinued        600 mg 100 mL/hr over 30 Minutes Intravenous Every 8 hours 10/12/20 1606 10/12/20 1626   10/12/20 1530  clindamycin (CLEOCIN) IVPB 600 mg        600 mg 100 mL/hr over 30 Minutes Intravenous  Once 10/12/20 1519 10/12/20 1935   10/12/20 1200  vancomycin  (VANCOREADY) IVPB 1500 mg/300 mL        1,500 mg 150 mL/hr over 120 Minutes Intravenous  Once 10/12/20 1153 10/12/20 1743   10/12/20 1200  cefTRIAXone (ROCEPHIN) 2 g in sodium chloride 0.9 % 100 mL IVPB        2 g 200 mL/hr over 30 Minutes Intravenous  Once 10/12/20 1153 10/12/20 1253       Assessment/Plan: s/p Procedure(s): IRRIGATION AND DEBRIDEMENT WOUND    Patient is a 75 year old female s/p debridement and irrigation of abdomen.  She had a panniculectomy on 09/26/2020 and was subsequently admitted for symptomatic anemia and sepsis.   When I saw patient this morning, her JP drain had nearly come out.  The drainage end was exposed, outside of the surgical site.  I removed the drain.  Wound VAC intact, functioning.  Suspect drainage was from the exposed drainage end of JP drain rather than leaking from wound VAC.  Patient's primary concern at this time is her right foot/ankle.    -JP drain was removed here at bedside.  Vaseline and gauze can be placed over site of JP drain insertion.  Change twice daily. -We will need to reinforce wound VAC with additional tape to help with the seal.  RN ordered some from tape downstairs, none available at time of my encounter.  Wound VAC does appear to be functioning well at this time.  -Continue with SCDs for DVT prophylaxis. -Continue with antibiotics per primary team.  No evidence of infection on exam at this time.   LOS: 6 days    Krista Blue, PA-C 10/18/2020

## 2020-10-18 NOTE — Progress Notes (Signed)
Occupational Therapy Treatment Patient Details Name: Teresa Golden MRN: [id number] DOB: December 20, 1945 Today's Date: 10/18/2020    History of present illness 75 year old female presents with suspected postop wound infection vs. dehiscence and is now s/p I&D of open incision (abdomen) on 8/22. She had elective panniculectomy and repair of supreumbilical hernia performed on 8/3.  Medical history significant for hypertension, DM2, hypothyroidism and obesity   OT comments  Pt limited in progression this session as her R ankle pain was very high and JP drain was slipping out of place causing significant drainage on pt gown and chucks. Pt requiring max A for all bed mobility and sit<>stands, unable to tolerate pivoting to BSC. OT provided pt and RN assist with bed mobility for dressings and pressure to be applied for drain slippage. OT will continue follow up acutely.    Follow Up Recommendations  SNF;Supervision/Assistance - 24 hour    Equipment Recommendations  None recommended by OT    Recommendations for Other Services      Precautions / Restrictions Precautions Precautions: Fall Precaution Comments: JP drain, wound vac Restrictions Weight Bearing Restrictions: No       Mobility Bed Mobility Overal bed mobility: Needs Assistance Bed Mobility: Rolling;Sidelying to Sit;Sit to Supine Rolling: Min assist Sidelying to sit: Mod assist;HOB elevated   Sit to supine: Mod assist;HOB elevated   General bed mobility comments: Pt limited due to pain in R ankle, requiring increased time for all transition    Transfers Overall transfer level: Needs assistance Equipment used: Rolling walker (2 wheeled) Transfers: Sit to/from Stand Sit to Stand: Max assist;From elevated surface         General transfer comment: Pt requiring max A to power up to standing, unable to maintain upright posture due to pain    Balance Overall balance assessment: Needs assistance Sitting-balance support: Feet  supported Sitting balance-Leahy Scale: Fair     Standing balance support: Bilateral upper extremity supported Standing balance-Leahy Scale: Poor                             ADL either performed or assessed with clinical judgement   ADL Overall ADL's : Needs assistance/impaired                         Toilet Transfer: Maximal assistance;Stand-pivot Toilet Transfer Details (indicate cue type and reason): Pt unable to tolerate pivot due to pain in R ankle Toileting- Clothing Manipulation and Hygiene: Maximal assistance;Sit to/from stand Toileting - Clothing Manipulation Details (indicate cue type and reason): Limited due to pain and balance     Functional mobility during ADLs: Maximal assistance;Rolling walker General ADL Comments: Pt limited in OOB mobility due to pain in her R ankle     Vision   Vision Assessment?: No apparent visual deficits   Perception     Praxis      Cognition Arousal/Alertness: Awake/alert Behavior During Therapy: WFL for tasks assessed/performed Overall Cognitive Status: Within Functional Limits for tasks assessed                                          Exercises     Shoulder Instructions       General Comments VSS, JP drain pulling further out during session, RN requiring assitance with assisting pt with bed mobility while pressure was  being applied to drain access.    Pertinent Vitals/ Pain       Pain Assessment: Faces Faces Pain Scale: Hurts whole lot Pain Location: R ankle Pain Descriptors / Indicators: Aching;Constant;Discomfort;Grimacing;Guarding Pain Intervention(s): Monitored during session;Repositioned  Home Living                                          Prior Functioning/Environment              Frequency  Min 2X/week        Progress Toward Goals  OT Goals(current goals can now be found in the care plan section)  Progress towards OT goals: Progressing  toward goals  Acute Rehab OT Goals Patient Stated Goal: get stronger OT Goal Formulation: With patient Time For Goal Achievement: 10/30/20 Potential to Achieve Goals: Fair ADL Goals Pt Will Perform Grooming: with supervision;standing Pt Will Perform Lower Body Bathing: with set-up;sit to/from stand Pt Will Perform Lower Body Dressing: with min guard assist;sit to/from stand Pt Will Transfer to Toilet: with supervision;ambulating Pt Will Perform Toileting - Clothing Manipulation and hygiene: with supervision;sit to/from stand Pt/caregiver will Perform Home Exercise Program: Increased strength;Both right and left upper extremity;With written HEP provided  Plan Discharge plan remains appropriate;Frequency remains appropriate    Co-evaluation                 AM-PAC OT "6 Clicks" Daily Activity     Outcome Measure   Help from another person eating meals?: None Help from another person taking care of personal grooming?: A Little Help from another person toileting, which includes using toliet, bedpan, or urinal?: A Lot Help from another person bathing (including washing, rinsing, drying)?: A Lot Help from another person to put on and taking off regular upper body clothing?: None Help from another person to put on and taking off regular lower body clothing?: A Lot 6 Click Score: 17    End of Session Equipment Utilized During Treatment: Gait belt;Rolling walker  OT Visit Diagnosis: Unsteadiness on feet (R26.81);Other abnormalities of gait and mobility (R26.89);Pain;Muscle weakness (generalized) (M62.81)   Activity Tolerance Patient limited by pain   Patient Left in bed;with call bell/phone within reach;with nursing/sitter in room   Nurse Communication Mobility status;Other (comment) (Pt JP drain site bleeding, Pt pain in R foot high)        Time: [id number] OT Time Calculation (min): 43 min  Charges: OT General Charges $OT Visit: 1 Visit OT Treatments $Self Care/Home  Management : 8-22 mins $Therapeutic Activity: 23-37 mins  Nala Kachel H., OTR/L Acute Rehabilitation  Jaymarion Trombly Elane Deauna Yaw 10/18/2020, 4:23 PM

## 2020-10-19 DIAGNOSIS — T8149XA Infection following a procedure, other surgical site, initial encounter: Secondary | ICD-10-CM | POA: Diagnosis not present

## 2020-10-19 DIAGNOSIS — M109 Gout, unspecified: Secondary | ICD-10-CM | POA: Diagnosis not present

## 2020-10-19 LAB — GLUCOSE, CAPILLARY
Glucose-Capillary: 168 mg/dL — ABNORMAL HIGH (ref 70–99)
Glucose-Capillary: 180 mg/dL — ABNORMAL HIGH (ref 70–99)
Glucose-Capillary: 209 mg/dL — ABNORMAL HIGH (ref 70–99)
Glucose-Capillary: 177 mg/dL — ABNORMAL HIGH (ref 70–99)

## 2020-10-19 LAB — CBC
HCT: 26.2 % — ABNORMAL LOW (ref 36.0–46.0)
Hemoglobin: 8.6 g/dL — ABNORMAL LOW (ref 12.0–15.0)
MCH: 28.2 pg (ref 26.0–34.0)
MCHC: 32.8 g/dL (ref 30.0–36.0)
MCV: 85.9 fL (ref 80.0–100.0)
Platelets: 293 10*3/uL (ref 150–400)
RBC: 3.05 MIL/uL — ABNORMAL LOW (ref 3.87–5.11)
RDW: 16.2 % — ABNORMAL HIGH (ref 11.5–15.5)
WBC: 10.3 10*3/uL (ref 4.0–10.5)
nRBC: 0 % (ref 0.0–0.2)

## 2020-10-19 MED ORDER — PREDNISONE 20 MG PO TABS
30.0000 mg | ORAL_TABLET | Freq: Every day | ORAL | Status: AC
  Administered 2020-10-19 – 2020-10-21 (×3): 30 mg via ORAL
  Filled 2020-10-19 (×5): qty 1

## 2020-10-19 NOTE — Progress Notes (Signed)
Physical Therapy Treatment Patient Details Name: Teresa Golden MRN: [id number] DOB: August 29, 1945 Today's Date: 10/19/2020    History of Present Illness 75 year old female presents with suspected postop wound infection vs. dehiscence and is now s/p I&D of open incision (abdomen) on 8/22. She had elective panniculectomy and repair of supreumbilical hernia performed on 8/3.  Medical history significant for hypertension, DM2, hypothyroidism and obesity    PT Comments    Patient received in bed, very anxious about going to SNF and mobility in general. Refused to follow precautions for abdominal incision today; able to perform multiple sit to stands with RW but very unsteady. Unaware of having had BM in the bed. Easily frustrated and very anxious today, became very agitated with me when I tried to enforce basic safety precautions such as RW use and actually refused to use RW for transferring to [facility].  Left on [facility] with call bell in reach, RN aware of patient status. Continue to recommend SNF.   Follow Up Recommendations  SNF;Supervision/Assistance - 24 hour     Equipment Recommendations  Rolling walker with 5" wheels;3in1 (PT)    Recommendations for Other Services       Precautions / Restrictions Precautions Precautions: Fall Precaution Comments: wound vac, R ankle gout flare, low frustration tolerance and can be agitated out of nowhere Restrictions Weight Bearing Restrictions: No    Mobility  Bed Mobility Overal bed mobility: Needs Assistance Bed Mobility: Supine to Sit     Supine to sit: Min assist;HOB elevated     General bed mobility comments: refused rolling to sidelying technique despite PT advice/recommendations- insisted on getting up to long sitting even with abdominal incisions    Transfers Overall transfer level: Needs assistance Equipment used: Rolling walker (2 wheeled);2 person hand held assist Transfers: Sit to/from [facility] Sit to Stand: Mod  assist Stand pivot transfers: Mod assist;+2 physical assistance       General transfer comment: standing was very painful today due to ongoing R ankle gout- able to stand x2 with RW but very unsteady due to pain. Able to transfer to [facility] with ModAx2 but became agitated and refused RW.  Ambulation/Gait             General Gait Details: deferred- pain R ankle/safety   Stairs             Wheelchair Mobility    Modified Rankin (Stroke Patients Only)       Balance Overall balance assessment: Needs assistance Sitting-balance support: Feet supported Sitting balance-Leahy Scale: Good     Standing balance support: Bilateral upper extremity supported Standing balance-Leahy Scale: Poor                              Cognition Arousal/Alertness: Awake/alert Behavior During Therapy: WFL for tasks assessed/performed Overall Cognitive Status: Within Functional Limits for tasks assessed                                        Exercises      General Comments        Pertinent Vitals/Pain Pain Assessment: Faces Faces Pain Scale: Hurts whole lot Pain Location: R ankle Pain Descriptors / Indicators: Aching;Constant;Discomfort;Grimacing;Guarding Pain Intervention(s): Limited activity within patient's tolerance;Monitored during session    Home Living  Prior Function            PT Goals (current goals can now be found in the care plan section) Acute Rehab PT Goals Patient Stated Goal: get stronger PT Goal Formulation: With patient Time For Goal Achievement: 10/30/20 Potential to Achieve Goals: Good Progress towards PT goals: Not progressing toward goals - comment (pain limited)    Frequency    Min 2X/week      PT Plan Current plan remains appropriate    Co-evaluation              AM-PAC PT "6 Clicks" Mobility   Outcome Measure  Help needed turning from your back to your side while in a flat  bed without using bedrails?: A Little Help needed moving from lying on your back to sitting on the side of a flat bed without using bedrails?: A Little Help needed moving to and from a bed to a chair (including a wheelchair)?: A Little Help needed standing up from a chair using your arms (e.g., wheelchair or bedside chair)?: A Little Help needed to walk in hospital room?: Total Help needed climbing 3-5 steps with a railing? : Total 6 Click Score: 14    End of Session Equipment Utilized During Treatment: Gait belt Activity Tolerance: Patient limited by pain Patient left: with call bell/phone within reach;Other (comment) (on [facility]) Nurse Communication: Mobility status PT Visit Diagnosis: Unsteadiness on feet (R26.81);Muscle weakness (generalized) (M62.81);Pain Pain - Right/Left:  (midline) Pain - part of body:  (abdomen)     Time: [id number] PT Time Calculation (min) (ACUTE ONLY): 41 min  Charges:  $Therapeutic Activity: 38-52 mins                    Windell Norfolk, DPT, PN2   Supplemental Physical Therapist [place]    Pager [phone] Acute Rehab Office [phone]

## 2020-10-19 NOTE — Progress Notes (Signed)
PROGRESS NOTE   Teresa Golden  [id number]    DOB: [birth year]    DOA: 10/12/2020  PCP: Glendale Chard, MD   I have briefly reviewed patients previous medical records in [facility].  Chief Complaint  Patient presents with   Wound Infection    Brief Narrative:  75 year old female with medical history significant for hypertension, DM2, hypothyroidism and obesity presents with suspected postop wound infection.  She had elective panniculectomy performed on 8/3 by Dr. Tedra Coupe him, plastic surgery and was found to have a supraumbilical hernia which was repaired by Dr. Quinn Plowman of general surgery.  She now presented with subjective fevers, chills and malaise and 3 to 400 mL output per day from the drain of brownish-yellow fluid.  She had gone to the plastic surgery office for routine follow-up and was advised to come to the hospital for further evaluation.  In the ED febrile up to 100.6 F.  Started empirically on antibiotics.  Plastic surgery was consulted, suspected more of her dehiscence than infection and underwent debridement of open incision in OR on 8/22.  Right ankle gout flare.   Assessment & Plan:  Principal Problem:   Wound infection after surgery Active Problems:   Hypothyroidism   Type 2 diabetes mellitus with stage 3 chronic kidney disease, with long-term current use of insulin (HCC)   Normocytic anemia   AKI (acute kidney injury) ([place])   Obesity (BMI 30-39.9)   Sepsis ([place])   Sepsis secondary to postoperative wound infection:  S/p panniculectomy on 8/3 by Dr. Marla Roe.  Febrile up to 101.3 with tachycardia and tachypnea.  WBC 4.5 with left shift but lactate normal.  CT abdomen and pelvis significant for postsurgical changes with concern for subcutaneous gas to rule out potential necrotizing fasciitis.  Started empirically on IV vancomycin, Rocephin and clindamycin.  Plastic surgery was consulted and underwent debridement in OR on 8/23.  Per plastic surgery  follow-up: Wound VAC for additional 5 days, JP drain dislodged and was removed 8/25, additional week of oral antibiotics from 10/19/2023, recommend SNF at discharge and outpatient follow-up with them in 1 week.  On IV antibiotics since 8/19 (IV ceftriaxone 8/19 >, IV vancomycin 8/19 >, IV clindamycin 8/19-8/22).  Discussed with ID 8/25 regarding choice of oral antibiotics, recommended Augmentin + doxycycline.  Blood cultures x2: Negative.  No surgical cultures to guide.  Overall improved.  Acute gout right ankle: History of gout.  Reports taking as needed colchicine for flares.  S/p colchicine 1.2 mg >0.6 mg on 8/25 with significant improvement but still painful and unable to weight-bear.  Avoiding NSAIDs due to GERD, anemia, recent AKI and DM.  Initiated prednisone 30 mg daily x5 days.  Monitor closely.  Anemia: S/p 2 units PRBC transfusion thus far this admission.  Hemoglobin stable in the 8 g range for the last several days, 8.6 today.  Follow CBC periodically and transfuse if hemoglobin 7 g or less.  Essential hypertension: Controlled.  Continue home dose of amlodipine, losartan and Toprol-XL.  Hypothyroidism: Continue Synthroid.  Type II DM: A1c 6.4 on 7/26.  Continue with SSI.  Increased Lantus from 6 to 9 units at bedtime which is her home dose.  Improved control since adjusting dose.  May need titrating of insulins now that prednisone has been started.  Acute kidney injury: Resolved.  Body mass index is 35.61 kg/m./Obesity   DVT prophylaxis: SCDs Start: 10/12/20 1602     Code Status: Full Code Family Communication: None at bedside. Disposition:  Status is: Inpatient  Remains inpatient appropriate because:Inpatient level of care appropriate due to severity of illness  Dispo: The patient is from: Home              Anticipated d/c is to: SNF              Patient currently is not medically stable for DC to SNF due to right ankle acute gout.  As per discussion with TOC, preauthorization  with [facility] has been initiated which takes several days anyway and probably into Monday of next week   Difficult to place patient No        Consultants:   Plastic surgery  Procedures:   Debridement of postop abdominal wound in OR 8/22.  Antimicrobials:    Anti-infectives (From admission, onward)    Start     Dose/Rate Route Frequency Ordered Stop   10/18/20 2200  doxycycline (VIBRA-TABS) tablet 100 mg        100 mg Oral Every 12 hours 10/18/20 1330 10/23/20 0959   10/18/20 2200  amoxicillin-clavulanate (AUGMENTIN) 875-125 MG per tablet 1 tablet        1 tablet Oral 2 times daily 10/18/20 1330 10/23/20 0959   10/15/20 1255  ceFAZolin 1 g / gentamicin 80 mg in NS 500 mL surgical irrigation  Status:  Discontinued          As needed 10/15/20 1255 10/15/20 1441   10/15/20 0745  ceFAZolin (ANCEF) IVPB 2g/100 mL premix        2 g 200 mL/hr over 30 Minutes Intravenous On call to O.R. 10/15/20 [id number] 10/15/20 1325   10/13/20 1600  cefTRIAXone (ROCEPHIN) 2 g in sodium chloride 0.9 % 100 mL IVPB  Status:  Discontinued        2 g 200 mL/hr over 30 Minutes Intravenous Every 24 hours 10/12/20 1606 10/18/20 1330   10/13/20 1400  vancomycin (VANCOREADY) IVPB 750 mg/150 mL  Status:  Discontinued        750 mg 150 mL/hr over 60 Minutes Intravenous Every 24 hours 10/12/20 1314 10/18/20 1330   10/13/20 0000  clindamycin (CLEOCIN) IVPB 900 mg  Status:  Discontinued        900 mg 100 mL/hr over 30 Minutes Intravenous Every 8 hours 10/12/20 1626 10/16/20 0916   10/12/20 2200  clindamycin (CLEOCIN) IVPB 600 mg  Status:  Discontinued        600 mg 100 mL/hr over 30 Minutes Intravenous Every 8 hours 10/12/20 1606 10/12/20 1626   10/12/20 1530  clindamycin (CLEOCIN) IVPB 600 mg        600 mg 100 mL/hr over 30 Minutes Intravenous  Once 10/12/20 1519 10/12/20 1935   10/12/20 1200  vancomycin (VANCOREADY) IVPB 1500 mg/300 mL        1,500 mg 150 mL/hr over 120 Minutes Intravenous  Once 10/12/20 1153  10/12/20 1743   10/12/20 1200  cefTRIAXone (ROCEPHIN) 2 g in sodium chloride 0.9 % 100 mL IVPB        2 g 200 mL/hr over 30 Minutes Intravenous  Once 10/12/20 1153 10/12/20 1253         Subjective:  Appropriate, mild and controlled lower abdominal postsurgical pain.  JP drain removed yesterday.  Right ankle pain significantly improved but still with swelling and having great difficulty with weightbearing with PT by bedside.  Objective:   Vitals:   10/18/20 1406 10/18/20 1649 10/18/20 1938 10/19/20 0408  BP: (!) 128/50 (!) 133/52 (!) 109/47 (!) 136/53  Pulse: 72 68 70 71  Resp:  '18 20 20  '$ Temp: 98.8 F (37.1 C) 98.7 F (37.1 C) 98.7 F (37.1 C) 98.6 F (37 C)  TempSrc: Oral Oral Oral Oral  SpO2: 98% 98% 97% 97%  Weight:      Height:        General exam: Elderly female, moderately built and obese sitting up at edge of bed, working with PT and having difficulty weightbearing on right ankle to get to bedside commode with 1 assistance. Respiratory system: Clear to auscultation.  No increased work of breathing. Cardiovascular system: S1 & S2 heard, RRR. No JVD, murmurs, rubs, gallops or clicks. No pedal edema.  Not on telemetry. Gastrointestinal system: Abdomen is nondistended, soft and nontender. No organomegaly or masses felt. Normal bowel sounds heard.  JP drain output.  Wound VAC in place.  No acute findings.  Central nervous system: Alert and oriented. No focal neurological deficits. Extremities: Symmetric 5 x 5 power.  Right ankle better, mildly swollen, still quite tender to touch but not warm and not as painful ROM.  No erythema. Skin: No rashes, lesions or ulcers Psychiatry: Judgement and insight appear normal. Mood & affect appropriate.     Data Reviewed:   I have personally reviewed following labs and imaging studies   CBC: Recent Labs  Lab 10/17/20 0146 10/18/20 0047 10/19/20 0103  WBC 8.9 9.0 10.3  HGB 8.1* 8.2* 8.6*  HCT 24.4* 24.6* 26.2*  MCV 85.9 85.4  85.9  PLT 264 285 0000000    Basic Metabolic Panel: Recent Labs  Lab 10/16/20 0111 10/17/20 0146 10/18/20 0047  NA 135 138 139  K 4.0 3.9 4.1  CL 106 107 109  CO2 21* 23 23  GLUCOSE 286* 206* 143*  BUN 24* 27* 20  CREATININE 1.39* 1.23* 1.01*  CALCIUM 8.5* 8.7* 8.9    Liver Function Tests: No results for input(s): AST, ALT, ALKPHOS, BILITOT, PROT, ALBUMIN in the last 168 hours.   CBG: Recent Labs  Lab 10/18/20 1947 10/19/20 0738 10/19/20 1246  GLUCAP 186* 168* 180*    Microbiology Studies:   Recent Results (from the past 240 hour(s))  Resp Panel by RT-PCR (Flu A&B, Covid) Nasopharyngeal Swab     Status: None   Collection Time: 10/12/20 12:05 PM   Specimen: Nasopharyngeal Swab; Nasopharyngeal(NP) swabs in vial transport medium  Result Value Ref Range Status   SARS Coronavirus 2 by RT PCR NEGATIVE NEGATIVE Final    Comment: (NOTE) SARS-CoV-2 target nucleic acids are NOT DETECTED.  The SARS-CoV-2 RNA is generally detectable in upper respiratory specimens during the acute phase of infection. The lowest concentration of SARS-CoV-2 viral copies this assay can detect is 138 copies/mL. A negative result does not preclude SARS-Cov-2 infection and should not be used as the sole basis for treatment or other patient management decisions. A negative result may occur with  improper specimen collection/handling, submission of specimen other than nasopharyngeal swab, presence of viral mutation(s) within the areas targeted by this assay, and inadequate number of viral copies(<138 copies/mL). A negative result must be combined with clinical observations, patient history, and epidemiological information. The expected result is Negative.  Fact Sheet for Patients:  EntrepreneurPulse.com.au  Fact Sheet for Healthcare Providers:  IncredibleEmployment.be  This test is no t yet approved or cleared by the [place] FDA and  has been authorized  for detection and/or diagnosis of SARS-CoV-2 by FDA under an Emergency Use Authorization (EUA). This EUA will remain  in effect (meaning  this test can be used) for the duration of the COVID-19 declaration under Section 564(b)(1) of the Act, 21 U.S.C.section 360bbb-3(b)(1), unless the authorization is terminated  or revoked sooner.       Influenza A by PCR NEGATIVE NEGATIVE Final   Influenza B by PCR NEGATIVE NEGATIVE Final    Comment: (NOTE) The Xpert Xpress SARS-CoV-2/FLU/RSV plus assay is intended as an aid in the diagnosis of influenza from Nasopharyngeal swab specimens and should not be used as a sole basis for treatment. Nasal washings and aspirates are unacceptable for Xpert Xpress SARS-CoV-2/FLU/RSV testing.  Fact Sheet for Patients: EntrepreneurPulse.com.au  Fact Sheet for Healthcare Providers: IncredibleEmployment.be  This test is not yet approved or cleared by the [place] FDA and has been authorized for detection and/or diagnosis of SARS-CoV-2 by FDA under an Emergency Use Authorization (EUA). This EUA will remain in effect (meaning this test can be used) for the duration of the COVID-19 declaration under Section 564(b)(1) of the Act, 21 U.S.C. section 360bbb-3(b)(1), unless the authorization is terminated or revoked.  Performed at [facility], [place] [address], [place], [place] [address]   Blood Culture (routine x 2)     Status: None   Collection Time: 10/12/20 12:05 PM   Specimen: BLOOD  Result Value Ref Range Status   Specimen Description BLOOD SITE NOT SPECIFIED  Final   Special Requests   Final    BOTTLES DRAWN AEROBIC AND ANAEROBIC Blood Culture results may not be optimal due to an inadequate volume of blood received in culture bottles   Culture   Final    NO GROWTH 5 DAYS Performed at [facility], [place] [address] [place], [place] [address]    Report Status 10/17/2020 FINAL  Final  Blood Culture  (routine x 2)     Status: None   Collection Time: 10/12/20 12:05 PM   Specimen: BLOOD  Result Value Ref Range Status   Specimen Description BLOOD SITE NOT SPECIFIED  Final   Special Requests   Final    BOTTLES DRAWN AEROBIC AND ANAEROBIC Blood Culture results may not be optimal due to an inadequate volume of blood received in culture bottles   Culture   Final    NO GROWTH 5 DAYS Performed at [facility], [place] [address], [place], [place] [address]    Report Status 10/17/2020 FINAL  Final  Surgical pcr screen     Status: Abnormal   Collection Time: 10/14/20  8:57 PM   Specimen: Nasal Mucosa; Nasal Swab  Result Value Ref Range Status   MRSA, PCR POSITIVE (A) NEGATIVE Final    Comment: RESULT CALLED TO, READ BACK BY AND VERIFIED WITH: C SISON RN 10/14/20 2306 JDW    Staphylococcus aureus POSITIVE (A) NEGATIVE Final    Comment: (NOTE) The Xpert SA Assay (FDA approved for NASAL specimens in patients 4 years of age and older), is one component of a comprehensive surveillance program. It is not intended to diagnose infection nor to guide or monitor treatment. Performed at [facility], [place] [address] [place], [place] [address]      Radiology Studies:  No results found.   Scheduled Meds:    amLODipine  5 mg Oral Daily   amoxicillin-clavulanate  1 tablet Oral BID   Chlorhexidine Gluconate Cloth  6 each Topical Daily   doxycycline  100 mg Oral Q12H   enoxaparin (LOVENOX) injection  50 mg Subcutaneous Daily   gabapentin  600 mg Oral  QHS   insulin aspart  0-15 Units Subcutaneous TID WC   insulin aspart  0-5 Units Subcutaneous QHS   insulin glargine-yfgn  9 Units Subcutaneous QHS   levothyroxine  12.5 mcg Oral Once per day on Mon Tue Wed Thu Fri   levothyroxine  25 mcg Oral Once per day on Sun Sat   losartan  100 mg Oral Daily   metoprolol succinate  25 mg Oral QHS   predniSONE  30 mg Oral Q breakfast    Continuous Infusions:       LOS: 7 days     Vernell Leep, MD, [place], [facility]. [facility]    To contact the attending provider between 7A-7P or the covering provider during after hours 7P-7A, please log into the web site [email or web address] and access using universal [place] password for that web site. If you do not have the password, please call the hospital operator.  10/19/2020, 3:42 PM

## 2020-10-19 NOTE — TOC Progression Note (Signed)
Transition of Care [facility]) - Progression Note    Patient Details  Name: Teresa Golden MRN: [id number] Date of Birth: [birth year]  Transition of Care [facility]) CM/SW Contact  Emeterio Reeve, [place] Phone Number: 10/19/2020, 3:44 PM  Clinical Narrative:     Pts Josem Kaufmann is still pending. [facility] is closed on the weekends.   Expected Discharge Plan: Skilled Nursing Facility Barriers to Discharge: Ship broker, Continued Medical Work up  Expected Discharge Plan and Services Expected Discharge Plan: [place] arrangements for the past 2 months: [place]                                       Social Determinants of Health (SDOH) Interventions    Readmission Risk Interventions No flowsheet data found.  Emeterio Reeve, [place] Clinical Social Worker [phone]

## 2020-10-20 DIAGNOSIS — T8149XA Infection following a procedure, other surgical site, initial encounter: Secondary | ICD-10-CM | POA: Diagnosis not present

## 2020-10-20 DIAGNOSIS — E039 Hypothyroidism, unspecified: Secondary | ICD-10-CM | POA: Diagnosis not present

## 2020-10-20 DIAGNOSIS — E669 Obesity, unspecified: Secondary | ICD-10-CM | POA: Diagnosis not present

## 2020-10-20 DIAGNOSIS — N179 Acute kidney failure, unspecified: Secondary | ICD-10-CM | POA: Diagnosis not present

## 2020-10-20 LAB — GLUCOSE, CAPILLARY
Glucose-Capillary: 273 mg/dL — ABNORMAL HIGH (ref 70–99)
Glucose-Capillary: 209 mg/dL — ABNORMAL HIGH (ref 70–99)
Glucose-Capillary: 246 mg/dL — ABNORMAL HIGH (ref 70–99)
Glucose-Capillary: 297 mg/dL — ABNORMAL HIGH (ref 70–99)

## 2020-10-20 NOTE — Progress Notes (Addendum)
PROGRESS NOTE    HAISLEY FISCAL  [id number] DOB: [birth year] DOA: 10/12/2020 PCP: Glendale Chard, MD    Brief Narrative:  Mrs. Son was admitted to the hospital with a working diagnosis of sepsis due to postoperative wound infection.  75 year old female past medical history for hypertension, type 2 diabetes mellitus, hypothyroidism and obesity who presented with wound infection.  Patient had an elective panniculectomy 8/3 by plastic surgery, at that time she also had a supraumbilical hernia repaired.  At home patient developed fever, chills, and malaise for about 24 hours leading to her hospitalization.  On the day of hospitalization she was evaluated in the outpatient plastic surgery office and she was referred for admission for evaluation.  On her initial physical examination she was febrile 100.6 F, heart rate 91-108, respiratory rate 20-28, her lungs are clear to auscultation bilaterally, heart S1-S2, present, rhythmic, abdomen was tender to palpation, bowel sounds are present, foul-smelling odor from lower abdominal wound.  She had a large open wound in the lower abdomen.  Sodium 136, potassium 4.1, chloride 105, bicarb 22, glucose 189, BUN 16, creatinine 1.39, Merkel count 4.5, hemoglobin 7.3, hematocrit 32.7, platelets 287. SARS COVID-19 negative.  Urinalysis specific gravity 1.011, negative for infection.   CT of the abdomen and pelvis with postsurgical changes of recent abdominoplasty and herniorrhaphy with subcutaneous gas.  No focal drainable collection or abscess.  Chest radiograph mild cardiomegaly.  EKG 103 bpm, normal axis, normal intervals, sinus rhythm, no ST segment or T wave changes.  Patient was placed on broad-spectrum antibiotic therapy.  She underwent debridement 8/23.  JP drain was removed 8/25.  Currently patient waiting to be transferred to skilled nursing facility. Her hospitalization has been complicated by acute gout attack on her right ankle and  postoperative anemia requiring 2 units of packed red blood cell transfusion.   Assessment & Plan:   Principal Problem:   Wound infection after surgery Active Problems:   Hypothyroidism   Type 2 diabetes mellitus with stage 3 chronic kidney disease, with long-term current use of insulin (HCC)   Normocytic anemia   AKI (acute kidney injury) ([place])   Obesity (BMI 30-39.9)   Sepsis ([place])   Sepsis due to postoperative wound infection. Patient clinically improving, has pain with movement, no nausea or vomiting.   Plan to continue antibiotic therapy with Augmentin and doxycycline. Follow wound care recommendations per plastic surgery Pain control   2. Acute left ankle gout arthritis. Patient clinically improving, with steroids. Continue pt and ot.  3. Post operative anemia. Sp 2 units PRBC transfusion with good toleration. Hgb and Hct have been stable. Continue close follow up on cell count.   4. HTN. Continue blood pressure control with amlodipine, losartan and metoprolol succinate.   5. Hypothyroid. Continue with levothyroxine  6. T2DM. Continue glucose cover and monitoring with insulin sliding scale, continue with basal insulin.   7. AKI. Renal function has improved.   8. Obesity class 2. Calculated BMI 35.61  Status is: Inpatient  Remains inpatient appropriate because:Inpatient level of care appropriate due to severity of illness  Dispo: The patient is from: Home              Anticipated d/c is to: SNF              Patient currently is medically stable to d/c.   Difficult to place patient No  DVT prophylaxis: Enoxaparin   Code Status:    full  Family Communication:  No family at the  bedside       Consultants:  Plastic surgery   Procedures:  Wound debridement   Antimicrobials:  Augmentin and doxycycline.     Subjective: Patient with abdominal pain with movement, no nausea or vomiting, ankle pain has resolved, continue to be very weak and deconditioned    Objective: Vitals:   10/19/20 1953 10/20/20 0355 10/20/20 0806 10/20/20 1138  BP: (!) 118/49 (!) 154/59 (!) 141/53 (!) 145/51  Pulse: 65 72 83 (!) 58  Resp: '19 19 17 18  '$ Temp: 98.6 F (37 C) 98.1 F (36.7 C) 97.8 F (36.6 C) 98.4 F (36.9 C)  TempSrc: Oral Oral Oral Oral  SpO2: 98% 99% 98% 100%  Weight:      Height:        Intake/Output Summary (Last 24 hours) at 10/20/2020 1453 Last data filed at 10/20/2020 1100 Gross per 24 hour  Intake 390 ml  Output 600 ml  Net -210 ml   Filed Weights   10/12/20 1145 10/15/20 1127  Weight: 97.1 kg 97.1 kg    Examination:   General: Not in pain or dyspnea, deconditioned  Neurology: Awake and alert, non focal  E ENT: no pallor, no icterus, oral mucosa moist Cardiovascular: No JVD. S1-S2 present, rhythmic, no gallops, rubs, or murmurs. No lower extremity edema. Pulmonary: positive breath sounds bilaterally, adequate air movement, no wheezing, rhonchi or rales. Gastrointestinal. Abdomen soft, has as large lower abdominal wound in place Skin. No rashes Musculoskeletal: no joint deformities     Data Reviewed: I have personally reviewed following labs and imaging studies  CBC: Recent Labs  Lab 10/14/20 0116 10/15/20 0038 10/16/20 0111 10/17/20 0146 10/18/20 0047 10/19/20 0103  WBC 6.1  --  9.3 8.9 9.0 10.3  HGB 7.1* 8.1* 8.4* 8.1* 8.2* 8.6*  HCT 21.8* 23.9* 24.9* 24.4* 24.6* 26.2*  MCV 85.2  --  85.0 85.9 85.4 85.9  PLT 241  --  241 264 285 0000000   Basic Metabolic Panel: Recent Labs  Lab 10/14/20 0116 10/15/20 0728 10/16/20 0111 10/17/20 0146 10/18/20 0047  NA 136 137 135 138 139  K 3.8 3.7 4.0 3.9 4.1  CL 106 106 106 107 109  CO2 20* 23 21* 23 23  GLUCOSE 121* 115* 286* 206* 143*  BUN 24* 22 24* 27* 20  CREATININE 1.54* 1.27* 1.39* 1.23* 1.01*  CALCIUM 8.1* 8.3* 8.5* 8.7* 8.9   GFR: Estimated Creatinine Clearance: 54.6 mL/min (A) (by C-G formula based on SCr of 1.01 mg/dL (H)). Liver Function Tests: No  results for input(s): AST, ALT, ALKPHOS, BILITOT, PROT, ALBUMIN in the last 168 hours. No results for input(s): LIPASE, AMYLASE in the last 168 hours. No results for input(s): AMMONIA in the last 168 hours. Coagulation Profile: No results for input(s): INR, PROTIME in the last 168 hours. Cardiac Enzymes: No results for input(s): CKTOTAL, CKMB, CKMBINDEX, TROPONINI in the last 168 hours. BNP (last 3 results) No results for input(s): PROBNP in the last 8760 hours. HbA1C: No results for input(s): HGBA1C in the last 72 hours. CBG: Recent Labs  Lab 10/19/20 1246 10/19/20 1736 10/19/20 2015 10/20/20 0807 10/20/20 1140  GLUCAP 180* 209* 177* 273* 209*   Lipid Profile: No results for input(s): CHOL, HDL, LDLCALC, TRIG, CHOLHDL, LDLDIRECT in the last 72 hours. Thyroid Function Tests: No results for input(s): TSH, T4TOTAL, FREET4, T3FREE, THYROIDAB in the last 72 hours. Anemia Panel: No results for input(s): VITAMINB12, FOLATE, FERRITIN, TIBC, IRON, RETICCTPCT in the last 72 hours.    Radiology  Studies: I have reviewed all of the imaging during this hospital visit personally     Scheduled Meds:  amLODipine  5 mg Oral Daily   amoxicillin-clavulanate  1 tablet Oral BID   doxycycline  100 mg Oral Q12H   enoxaparin (LOVENOX) injection  50 mg Subcutaneous Daily   gabapentin  600 mg Oral QHS   insulin aspart  0-15 Units Subcutaneous TID WC   insulin aspart  0-5 Units Subcutaneous QHS   insulin glargine-yfgn  9 Units Subcutaneous QHS   levothyroxine  12.5 mcg Oral Once per day on Mon Tue Wed Thu Fri   levothyroxine  25 mcg Oral Once per day on Sun Sat   losartan  100 mg Oral Daily   metoprolol succinate  25 mg Oral QHS   predniSONE  30 mg Oral Q breakfast   Continuous Infusions:   LOS: 8 days        Ondra Deboard Gerome Apley, MD

## 2020-10-21 DIAGNOSIS — T8149XA Infection following a procedure, other surgical site, initial encounter: Secondary | ICD-10-CM | POA: Diagnosis not present

## 2020-10-21 DIAGNOSIS — E039 Hypothyroidism, unspecified: Secondary | ICD-10-CM | POA: Diagnosis not present

## 2020-10-21 DIAGNOSIS — D649 Anemia, unspecified: Secondary | ICD-10-CM | POA: Diagnosis not present

## 2020-10-21 DIAGNOSIS — N179 Acute kidney failure, unspecified: Secondary | ICD-10-CM | POA: Diagnosis not present

## 2020-10-21 LAB — GLUCOSE, CAPILLARY
Glucose-Capillary: 226 mg/dL — ABNORMAL HIGH (ref 70–99)
Glucose-Capillary: 180 mg/dL — ABNORMAL HIGH (ref 70–99)
Glucose-Capillary: 272 mg/dL — ABNORMAL HIGH (ref 70–99)
Glucose-Capillary: 225 mg/dL — ABNORMAL HIGH (ref 70–99)

## 2020-10-21 MED ORDER — INSULIN ASPART 100 UNIT/ML IJ SOLN
3.0000 [IU] | Freq: Three times a day (TID) | INTRAMUSCULAR | Status: DC
  Administered 2020-10-21 – 2020-10-22 (×3): 3 [IU] via SUBCUTANEOUS

## 2020-10-21 NOTE — Plan of Care (Signed)

## 2020-10-21 NOTE — Progress Notes (Signed)
PROGRESS NOTE    Teresa Golden  [id number] DOB: 10/04/1945 DOA: 10/12/2020 PCP: Glendale Chard, MD    Brief Narrative:  [name] was admitted to the hospital with a working diagnosis of sepsis due to postoperative wound infection. Now sp debridement pending transfer to SNF.    75 year old female past medical history for hypertension, type 2 diabetes mellitus, hypothyroidism and obesity who presented with wound infection.  Patient had an elective panniculectomy 8/3 by plastic surgery, at that time she also had a supraumbilical hernia repaired.  At home patient developed fever, chills, and malaise for about 24 hours leading to her hospitalization.  On the day of hospitalization she was evaluated in the outpatient plastic surgery office and she was referred for admission for evaluation.  On her initial physical examination she was febrile 100.6 F, heart rate 91-108, respiratory rate 20-28, her lungs are clear to auscultation bilaterally, heart S1-S2, present, rhythmic, abdomen was tender to palpation, bowel sounds are present, foul-smelling odor from lower abdominal wound.  She had a large open wound in the lower abdomen.   Sodium 136, potassium 4.1, chloride 105, bicarb 22, glucose 189, BUN 16, creatinine 1.39, [place] count 4.5, hemoglobin 7.3, hematocrit 32.7, platelets 287. SARS COVID-19 negative.   Urinalysis specific gravity 1.011, negative for infection.   CT of the abdomen and pelvis with postsurgical changes of recent abdominoplasty and herniorrhaphy with subcutaneous gas.  No focal drainable collection or abscess.   Chest radiograph mild cardiomegaly.   EKG 103 bpm, normal axis, normal intervals, sinus rhythm, no ST segment or T wave changes.   Patient was placed on broad-spectrum antibiotic therapy.  She underwent debridement 8/23.  JP drain was removed 8/25.   Currently patient waiting to be transferred to skilled nursing facility. Her hospitalization has been complicated by  acute gout attack on her right ankle and postoperative anemia requiring 2 units of packed red blood cell transfusion.   Assessment & Plan:   Principal Problem:   Wound infection after surgery Active Problems:   Hypothyroidism   Type 2 diabetes mellitus with stage 3 chronic kidney disease, with long-term current use of insulin (HCC)   Normocytic anemia   AKI (acute kidney injury) ([place])   Obesity (BMI 30-39.9)   Sepsis ([place])     Sepsis due to postoperative wound infection.  Tolerating po well with no nausea or vomiting.   On Augmentin and doxycycline for antibiotic therapy. Today is day 9 of antibiotic therapy will plan to stop 09/03 No signs of ongoing infection.  Pain control with hydrocodone   Continue pain control and encourage mobility, plan to transfer to SNF. Continue wound care per plastic surgery currently wound vac in place.    2. Acute left ankle gout arthritis.  Pain is well controlled, on systemic steroids for 3 days.   3. Post operative anemia. Sp 2 units PRBC transfusion with good toleration. Hgb and Hct have been stable.  Plan to follow up cell count as outpatient.    4. HTN. Blood pressure control with amlodipine, losartan and metoprolol succinate.    5. Hypothyroid. On Levothyroxine   6. T2DM. On Insulin sliding scale, continue with basal insulin.  Capillary glucose 246, 297, 226 and 180.  Continue basal insulin of 9 units and will add pre-meal insulin 3 units.  Diabetic neuropathy, continue with gabapentin 300 mg qhs.    7. AKI. Renal function has improved. AKI has resolved.    8. Obesity class 2. Calculated BMI 35.61/ ps sp  sleeve gastrostomy in the past.   Status is: Inpatient  Remains inpatient appropriate because:Inpatient level of care appropriate due to severity of illness  Dispo: The patient is from: Home              Anticipated d/c is to: SNF              Patient currently is medically stable to d/c.   Difficult to place patient  No   DVT prophylaxis: Enoxaparin   Code Status:    Full   Family Communication:   No family at the bedside     Consultants:  Plastic surgery   Procedures:  Wound debridement   Antimicrobials:  Augmentin and doxycyline     Subjective: Patient with no nausea or vomiting, her abdominal pain continue to improve, continue to be very weak and deconditioned.   Objective: Vitals:   10/20/20 1641 10/20/20 2044 10/21/20 0435 10/21/20 1142  BP: (!) 150/56 (!) 144/55 (!) 146/59 (!) 178/65  Pulse: 61 68 62 (!) 59  Resp: '17 17 17 18  '$ Temp: 98.3 F (36.8 C) 97.7 F (36.5 C) 98.1 F (36.7 C) 97.9 F (36.6 C)  TempSrc: Oral Oral Oral Oral  SpO2: 99% 100% 100% 99%  Weight:      Height:        Intake/Output Summary (Last 24 hours) at 10/21/2020 1252 Last data filed at 10/21/2020 0452 Gross per 24 hour  Intake 480 ml  Output 100 ml  Net 380 ml   Filed Weights   10/12/20 1145 10/15/20 1127  Weight: 97.1 kg 97.1 kg    Examination:   General: Not in pain or dyspnea. Deconditioned  Neurology: Awake and alert, non focal  E ENT: no pallor, no icterus, oral mucosa moist Cardiovascular: No JVD. S1-S2 present, rhythmic, no gallops, rubs, or murmurs. No lower extremity edema. Pulmonary:  positive breath sounds bilaterally, adequate air movement, no wheezing, rhonchi or rales. Gastrointestinal. Abdomen soft and non tender to superficial palpation Skin. Lower abdominal wound with wound vac in place.  Musculoskeletal: no joint deformities     Data Reviewed: I have personally reviewed following labs and imaging studies  CBC: Recent Labs  Lab 10/15/20 0038 10/16/20 0111 10/17/20 0146 10/18/20 0047 10/19/20 0103  WBC  --  9.3 8.9 9.0 10.3  HGB 8.1* 8.4* 8.1* 8.2* 8.6*  HCT 23.9* 24.9* 24.4* 24.6* 26.2*  MCV  --  85.0 85.9 85.4 85.9  PLT  --  241 264 285 0000000   Basic Metabolic Panel: Recent Labs  Lab 10/15/20 0728 10/16/20 0111 10/17/20 0146 10/18/20 0047  NA 137 135  138 139  K 3.7 4.0 3.9 4.1  CL 106 106 107 109  CO2 23 21* 23 23  GLUCOSE 115* 286* 206* 143*  BUN 22 24* 27* 20  CREATININE 1.27* 1.39* 1.23* 1.01*  CALCIUM 8.3* 8.5* 8.7* 8.9   GFR: Estimated Creatinine Clearance: 54.6 mL/min (A) (by C-G formula based on SCr of 1.01 mg/dL (H)). Liver Function Tests: No results for input(s): AST, ALT, ALKPHOS, BILITOT, PROT, ALBUMIN in the last 168 hours. No results for input(s): LIPASE, AMYLASE in the last 168 hours. No results for input(s): AMMONIA in the last 168 hours. Coagulation Profile: No results for input(s): INR, PROTIME in the last 168 hours. Cardiac Enzymes: No results for input(s): CKTOTAL, CKMB, CKMBINDEX, TROPONINI in the last 168 hours. BNP (last 3 results) No results for input(s): PROBNP in the last 8760 hours. HbA1C: No results for input(s): HGBA1C  in the last 72 hours. CBG: Recent Labs  Lab 10/20/20 1140 10/20/20 1640 10/20/20 2045 10/21/20 0738 10/21/20 1133  GLUCAP 209* 246* 297* 226* 180*   Lipid Profile: No results for input(s): CHOL, HDL, LDLCALC, TRIG, CHOLHDL, LDLDIRECT in the last 72 hours. Thyroid Function Tests: No results for input(s): TSH, T4TOTAL, FREET4, T3FREE, THYROIDAB in the last 72 hours. Anemia Panel: No results for input(s): VITAMINB12, FOLATE, FERRITIN, TIBC, IRON, RETICCTPCT in the last 72 hours.    Radiology Studies: I have reviewed all of the imaging during this hospital visit personally     Scheduled Meds:  amLODipine  5 mg Oral Daily   amoxicillin-clavulanate  1 tablet Oral BID   doxycycline  100 mg Oral Q12H   enoxaparin (LOVENOX) injection  50 mg Subcutaneous Daily   gabapentin  600 mg Oral QHS   insulin aspart  0-15 Units Subcutaneous TID WC   insulin aspart  0-5 Units Subcutaneous QHS   insulin glargine-yfgn  9 Units Subcutaneous QHS   levothyroxine  12.5 mcg Oral Once per day on Mon Tue Wed Thu Fri   levothyroxine  25 mcg Oral Once per day on Sun Sat   losartan  100 mg Oral  Daily   metoprolol succinate  25 mg Oral QHS   Continuous Infusions:   LOS: 9 days        Celese Banner Gerome Apley, MD

## 2020-10-21 NOTE — Plan of Care (Signed)

## 2020-10-22 ENCOUNTER — Ambulatory Visit: Payer: Medicare HMO | Admitting: Addiction (Substance Use Disorder)

## 2020-10-22 ENCOUNTER — Ambulatory Visit: Payer: Self-pay

## 2020-10-22 DIAGNOSIS — E1122 Type 2 diabetes mellitus with diabetic chronic kidney disease: Secondary | ICD-10-CM

## 2020-10-22 DIAGNOSIS — E669 Obesity, unspecified: Secondary | ICD-10-CM | POA: Diagnosis not present

## 2020-10-22 DIAGNOSIS — L89312 Pressure ulcer of right buttock, stage 2: Secondary | ICD-10-CM

## 2020-10-22 DIAGNOSIS — L8990 Pressure ulcer of unspecified site, unspecified stage: Secondary | ICD-10-CM | POA: Insufficient documentation

## 2020-10-22 DIAGNOSIS — T8149XA Infection following a procedure, other surgical site, initial encounter: Secondary | ICD-10-CM | POA: Diagnosis not present

## 2020-10-22 DIAGNOSIS — Z794 Long term (current) use of insulin: Secondary | ICD-10-CM

## 2020-10-22 DIAGNOSIS — E039 Hypothyroidism, unspecified: Secondary | ICD-10-CM | POA: Diagnosis not present

## 2020-10-22 DIAGNOSIS — N179 Acute kidney failure, unspecified: Secondary | ICD-10-CM | POA: Diagnosis not present

## 2020-10-22 DIAGNOSIS — F419 Anxiety disorder, unspecified: Secondary | ICD-10-CM

## 2020-10-22 DIAGNOSIS — N182 Chronic kidney disease, stage 2 (mild): Secondary | ICD-10-CM

## 2020-10-22 LAB — RESP PANEL BY RT-PCR (FLU A&B, COVID) ARPGX2
Influenza A by PCR: NEGATIVE
Influenza B by PCR: NEGATIVE
SARS Coronavirus 2 by RT PCR: NEGATIVE

## 2020-10-22 LAB — GLUCOSE, CAPILLARY
Glucose-Capillary: 186 mg/dL — ABNORMAL HIGH (ref 70–99)
Glucose-Capillary: 138 mg/dL — ABNORMAL HIGH (ref 70–99)
Glucose-Capillary: 91 mg/dL (ref 70–99)

## 2020-10-22 MED ORDER — ACETAMINOPHEN 325 MG PO TABS: 650.0000 mg | ORAL_TABLET | Freq: Four times a day (QID) | ORAL | Status: DC | PRN

## 2020-10-22 MED ORDER — POLYETHYLENE GLYCOL 3350 17 G PO PACK: 17.0000 g | PACK | Freq: Every day | ORAL | 0 refills | Status: DC

## 2020-10-22 MED ORDER — HYDROCODONE-ACETAMINOPHEN 5-325 MG PO TABS: 1.0000 | ORAL_TABLET | Freq: Four times a day (QID) | ORAL | 0 refills | Status: DC | PRN

## 2020-10-22 MED ORDER — AMOXICILLIN-POT CLAVULANATE 875-125 MG PO TABS: 1.0000 | ORAL_TABLET | Freq: Two times a day (BID) | ORAL | 0 refills | Status: AC

## 2020-10-22 MED ORDER — POLYETHYLENE GLYCOL 3350 17 G PO PACK: 17.0000 g | PACK | Freq: Every day | ORAL | Status: DC

## 2020-10-22 MED ORDER — DOXYCYCLINE HYCLATE 100 MG PO TABS: 100.0000 mg | ORAL_TABLET | Freq: Two times a day (BID) | ORAL | 0 refills | Status: AC

## 2020-10-22 NOTE — Discharge Summary (Addendum)
Physician Discharge Summary  Teresa Golden [id number] DOB: [birth year] DOA: 10/12/2020  PCP: Glendale Chard, MD  Admit date: 10/12/2020 Discharge date: 10/22/2020  Admitted From: Home  Disposition:   SNF   Recommendations for Outpatient Follow-up and new medication changes:  Follow up with Dr. Baird Cancer in 7 to 10 days.  Follow up with plastic surgery as scheduled  Complete antibiotic therapy on 10/27/20. Continue wound care per plastic surgery.   Home Health: na   Equipment/Devices: na    Discharge Condition: stable  CODE STATUS: full  Diet recommendation:  heart healthy and diabetic prudent.   Brief/Interim Summary: Teresa Golden was admitted to the hospital with a working diagnosis of sepsis due to postoperative wound infection. Now sp debridement being transfer to SNF.    75 year old female past medical history for hypertension, type 2 diabetes mellitus, hypothyroidism and obesity who presented with wound infection.  Patient had an elective panniculectomy 8/3 by plastic surgery, at that time she also had a supraumbilical hernia repaired.  At home patient developed fever, chills, and malaise for about 24 hours leading to her hospitalization.  On the day of hospitalization she was evaluated in the outpatient plastic surgery office and she was referred for admission for further evaluation.  On her initial physical examination she was febrile 100.6 F, heart rate 91-108, respiratory rate 20-28, her lungs were clear to auscultation bilaterally, heart S1-S2, present, rhythmic, abdomen was tender to palpation, bowel sounds were present, foul-smelling odor from lower abdominal wound.  She had a large open wound in the lower abdomen.   Sodium 136, potassium 4.1, chloride 105, bicarb 22, glucose 189, BUN 16, creatinine 1.39, Feasel count 4.5, hemoglobin 7.3, hematocrit 32.7, platelets 287. SARS COVID-19 negative.   Urinalysis specific gravity 1.011, negative for infection.   CT of the abdomen  and pelvis with postsurgical changes of recent abdominoplasty and herniorrhaphy with subcutaneous gas.  No focal drainable collection or abscess.   Chest radiograph mild cardiomegaly.   EKG 103 bpm, normal axis, normal intervals, sinus rhythm, no ST segment or T wave changes.   Patient was placed on broad-spectrum antibiotic therapy.  She underwent debridement 8/23.  JP drain was removed 8/25.   Currently patient being transferred to skilled nursing facility. Her hospitalization has been complicated by acute gout attack on her right ankle and postoperative anemia requiring 2 units of packed red blood cell transfusion  Sepsis due to postoperative wound infection.  Patient had a prolonged hospitalization, she required broad-spectrum IV antibiotic therapy and surgical intervention with debridement. A wound VAC was placed with good toleration. She has been transitioned to oral antibiotic therapy with doxycycline and Augmentin to complete on 09/03. Patient very weak and deconditioned, being transferred to skilled nursing facility to continue rehab and wound care. Patient will follow-up with plastic surgery as an outpatient.  2.  Acute left ankle gouty arthritis.  Patient was treated with a short course of systemic steroids with good toleration.  3.  Postoperative anemia.  Patient developed worsening anemia, lowest hemoglobin 7.1, hematocrit 20.9.  Patient required 2 units of packed red blood cell transfusion.  At discharge her hemoglobin is 8.6 and hematocrit 26.2, platelets 293.  4.  Hypertension.  Continue blood pressure control amlodipine, losartan and metoprolol succinate.  5.  Hypothyroidism.  Continue levothyroxine.  6.  Type 2 diabetes mellitus/ dyslipidemia .  Patient had uncontrolled type 2 diabetes mellitus with hyperglycemia, she received basal insulin, sliding scale and pre-meal insulin dosing. At her discharge her fasting  glucose is 186. Patient was continued on gabapentin for  diabetic neuropathy.  At discharge resume tersiba  Continue with simvastatin   7.  Acute kidney injury on CKD stage 2.  Patient received supportive medical therapy with improvement of kidney function. At discharge sodium 139, potassium 4.1, chloride 109, bicarb 23, glucose 143, BUN 20, creatinine 1.0.  8.  Obesity class II/pressure wound stage II, right buttock, and sacrum.  Not clear if present on admission.  Her calculated BMI 35.6.  She had history of sleeve gastrectomy in the past. Continue skin care.  Discharge Diagnoses:  Principal Problem:   Wound infection after surgery Active Problems:   Hypothyroidism   Type 2 diabetes mellitus with stage 3 chronic kidney disease, with long-term current use of insulin (HCC)   Normocytic anemia   AKI (acute kidney injury) ([place])   Obesity (BMI 30-39.9)   Sepsis ([place])   Pressure injury of skin    Discharge Instructions   Allergies as of 10/22/2020       Reactions   Meloxicam Other (See Comments)   Fever; muscle aches; "flu-like" symptoms   Other    Rose fever and hay fever         Medication List     STOP taking these medications    naproxen sodium 220 MG tablet Commonly known as: ALEVE       TAKE these medications    acetaminophen 325 MG tablet Commonly known as: TYLENOL Take 2 tablets (650 mg total) by mouth every 6 (six) hours as needed for moderate pain or mild pain.   amLODipine 5 MG tablet Commonly known as: NORVASC Take 1 tablet by mouth once daily   amoxicillin-clavulanate 875-125 MG tablet Commonly known as: AUGMENTIN Take 1 tablet by mouth 2 (two) times daily for 5 days.   B-complex with vitamin C tablet Take 1 tablet by mouth daily.   Biotin 10000 MCG Tabs Take 10,000 mcg by mouth daily.   Cholecalciferol 125 MCG (5000 UT) Tabs Take 5,000 Units by mouth daily.   Colchicine 0.6 MG Caps TAKE 1 CAPSULE DAILY AS    NEEDED What changed: See the new instructions.   Cranberry-Vitamin C 250-60 MG  Caps Take 1 tablet by mouth daily.   diclofenac Sodium 1 % Gel Commonly known as: VOLTAREN APPLY 2 GRAMS TO AFFECTED AREA(S) 4 TIMES DAILY AS NEEDED What changed: See the new instructions.   doxycycline 100 MG tablet Commonly known as: VIBRA-TABS Take 1 tablet (100 mg total) by mouth every 12 (twelve) hours for 5 days.   gabapentin 300 MG capsule Commonly known as: NEURONTIN Take 600 mg by mouth at bedtime.   HYDROcodone-acetaminophen 5-325 MG tablet Commonly known as: NORCO/VICODIN Take 1 tablet by mouth every 6 (six) hours as needed for severe pain.   insulin degludec 100 UNIT/ML FlexTouch Pen Commonly known as: [name] FlexTouch Inject 0.09 mLs (9 Units total) into the skin daily at 10 pm.   losartan 100 MG tablet Commonly known as: COZAAR Take 100 mg by mouth daily.   metoprolol succinate 25 MG 24 hr tablet Commonly known as: TOPROL-XL 1 tablet by mouth daily at bedtime   multivitamin with minerals tablet Take 1 tablet by mouth daily. Women's 50+   ondansetron 4 MG tablet Commonly known as: ZOFRAN TAKE 1 TABLET BY MOUTH EVERY 8 HOURS AS NEEDED FOR NAUSEA FOR VOMITING What changed: See the new instructions.   OneTouch Delica Lancets 99991111 Misc Use as instructed to check blood sugars 2 times  per day dx: e11.65   OneTouch Verio test strip Generic drug: glucose blood Use as instructed to check blood sugars 2 times per day dx: e11.65   Ozempic (1 MG/DOSE) 2 MG/1.5ML Sopn Generic drug: Semaglutide (1 MG/DOSE) INJECT INTO SKIN ONCE A WEEK What changed: See the new instructions.   polyethylene glycol 17 g packet Commonly known as: MIRALAX / GLYCOLAX Take 17 g by mouth daily.   simvastatin 10 MG tablet Commonly known as: ZOCOR Take 1 tablet (10 mg total) by mouth daily.   Synthroid 25 MCG tablet Generic drug: levothyroxine Take 0.5 tablet (12.5 mcg) by mouth on Mondays through Fridays, then take 1 tablet (25 mcg) by mouth on Saturdays and Sundays.         Allergies  Allergen Reactions   Meloxicam Other (See Comments)    Fever; muscle aches; "flu-like" symptoms   Other     Rose fever and hay fever     Consultations: Plastic surgery    Procedures/Studies: CT ABDOMEN PELVIS WO CONTRAST  Result Date: 10/12/2020 CLINICAL DATA:  Abdominal pain.  Recent hernia repair EXAM: CT ABDOMEN AND PELVIS WITHOUT CONTRAST TECHNIQUE: Multidetector CT imaging of the abdomen and pelvis was performed following the standard protocol without IV contrast. COMPARISON:  Multi phasic CT abdomen, 09/21/2015. KUB, 09/26/2020. Abdominal ultrasound, 07/12/2020. FINDINGS: Suboptimal evaluation secondary to beam hardening artifact from the upper extremities. Lower chest: No acute abnormality. Hepatobiliary: Normal size and overall contour. Known RIGHT hepatic lobe mass is better appreciated on comparison ultrasound and multiphasic CT. Focal liver abnormality is seen. Status post cholecystectomy. No biliary dilatation. Pancreas: Unremarkable. No pancreatic ductal dilatation or surrounding inflammatory changes. Spleen: Normal in size without focal abnormality. Adrenals/Urinary Tract: Adrenal glands are unremarkable. Kidneys are normal, without renal calculi, focal lesion, or hydronephrosis. Bladder is unremarkable. Stomach/Bowel: Small hiatus hernia. Partial gastrectomy. Nonobstructed small bowel. Normal retrocecal appendix. Nondilated colon. Vascular/Lymphatic: Aortic atherosclerosis without aneurysmal dilatation. No enlarged abdominal or pelvic lymph nodes. Reproductive: Status post hysterectomy. No adnexal masses. Other: *Postsurgical changes of recent abdominoplasty/herniorrhaphy, including surgical clips, subcutaneous gas and surgical drain overlying the fascia. *Small fat containing umbilical hernia. *Anterior abdominal wall subcutaneous stranding with small volume layering fluid. No focal drainable collection or abscess. *RIGHT gluteal subcutaneous injection site.  Musculoskeletal: Multilevel degenerative changes of imaged spine, worse at the lower lumbar spine. No acute osseous abnormality. IMPRESSION: 1. Postsurgical changes of recent abdominoplasty/herniorrhaphy with subcutaneous gas. Clinical correlation is recommended to evaluate degree of infection or rule out potential necrotizing fasciitis. 2. No focal drainable collection or abscess. Electronically Signed   By: Michaelle Birks M.D.   On: 10/12/2020 14:43   DG Chest Port 1 View  Result Date: 10/12/2020 CLINICAL DATA:  Questionable sepsis EXAM: PORTABLE CHEST 1 VIEW COMPARISON:  11/15/2015 FINDINGS: Mild cardiomegaly. Atherosclerotic calcification of the aortic knob. No focal airspace consolidation, pleural effusion, or pneumothorax. Degenerative changes of the bilateral shoulders and thoracic spine. IMPRESSION: Mild cardiomegaly. No acute cardiopulmonary findings. Electronically Signed   By: Davina Poke D.O.   On: 10/12/2020 12:32   DG OR LOCAL ABDOMEN  Result Date: 09/26/2020 CLINICAL DATA:  Evaluate for retained foreign body. EXAM: OR LOCAL ABDOMEN COMPARISON:  None. FINDINGS: 1148 hours. Surgical drain overlies the upper abdomen. Surgical clips overlie the abdomen bilaterally. No evidence for unexpected retained radiopaque soft tissue foreign body. Degenerative changes noted in the lumbar spine. Bowel gas pattern is nonspecific. IMPRESSION: Surgical drain overlies the upper abdomen. No evidence for unexpected radiopaque soft tissue foreign body.  Findings were called by me to [facility], in the operating room, at 1202 hours on 09/26/2020. Electronically Signed   By: Misty Stanley M.D.   On: 09/26/2020 12:01     Procedures: Excision of abdominal incisional dehiscence 3 x 15 cm skin and soft tissue.  Placement of ACell 2 g, placement of VAC  Subjective: Patient is feeling better, abdominal pain is controlled, no nausea or vomiting, continue to be very weak and deconditioned.   Discharge Exam: Vitals:    10/22/20 0358 10/22/20 0734  BP: (!) 163/69 (!) 155/55  Pulse: 63 (!) 58  Resp: 17   Temp: 98.1 F (36.7 C) 98.6 F (37 C)  SpO2: 98% 100%   Vitals:   10/21/20 1800 10/21/20 2018 10/22/20 0358 10/22/20 0734  BP: (!) 144/77 (!) 172/68 (!) 163/69 (!) 155/55  Pulse: 73 70 63 (!) 58  Resp: '19 19 17   '$ Temp: 97.8 F (36.6 C) 98.1 F (36.7 C) 98.1 F (36.7 C) 98.6 F (37 C)  TempSrc: Oral Oral Oral Oral  SpO2: 98% 98% 98% 100%  Weight:      Height:        General: Not in pain or dyspnea  Neurology: Awake and alert, non focal  E ENT: mild pallor, no icterus, oral mucosa moist Cardiovascular: No JVD. S1-S2 present, rhythmic, no gallops, rubs, or murmurs. No lower extremity edema. Pulmonary: positive breath sounds bilaterally, adequate air movement, no wheezing, rhonchi or rales. Gastrointestinal. Abdomen protuberant, non tender to superficial palpation, wound clean with dressing in place Skin. Stage 2 right buttock and sacrum pressure ulcer.  Musculoskeletal: no joint deformities       The results of significant diagnostics from this hospitalization (including imaging, microbiology, ancillary and laboratory) are listed below for reference.     Microbiology: Recent Results (from the past 240 hour(s))  Resp Panel by RT-PCR (Flu A&B, Covid) Nasopharyngeal Swab     Status: None   Collection Time: 10/12/20 12:05 PM   Specimen: Nasopharyngeal Swab; Nasopharyngeal(NP) swabs in vial transport medium  Result Value Ref Range Status   SARS Coronavirus 2 by RT PCR NEGATIVE NEGATIVE Final    Comment: (NOTE) SARS-CoV-2 target nucleic acids are NOT DETECTED.  The SARS-CoV-2 RNA is generally detectable in upper respiratory specimens during the acute phase of infection. The lowest concentration of SARS-CoV-2 viral copies this assay can detect is 138 copies/mL. A negative result does not preclude SARS-Cov-2 infection and should not be used as the sole basis for treatment or other  patient management decisions. A negative result may occur with  improper specimen collection/handling, submission of specimen other than nasopharyngeal swab, presence of viral mutation(s) within the areas targeted by this assay, and inadequate number of viral copies(<138 copies/mL). A negative result must be combined with clinical observations, patient history, and epidemiological information. The expected result is Negative.  Fact Sheet for Patients:  EntrepreneurPulse.com.au  Fact Sheet for Healthcare Providers:  IncredibleEmployment.be  This test is no t yet approved or cleared by the [place] FDA and  has been authorized for detection and/or diagnosis of SARS-CoV-2 by FDA under an Emergency Use Authorization (EUA). This EUA will remain  in effect (meaning this test can be used) for the duration of the COVID-19 declaration under Section 564(b)(1) of the Act, 21 U.S.C.section 360bbb-3(b)(1), unless the authorization is terminated  or revoked sooner.       Influenza A by PCR NEGATIVE NEGATIVE Final   Influenza B by PCR NEGATIVE NEGATIVE Final  Comment: (NOTE) The Xpert Xpress SARS-CoV-2/FLU/RSV plus assay is intended as an aid in the diagnosis of influenza from Nasopharyngeal swab specimens and should not be used as a sole basis for treatment. Nasal washings and aspirates are unacceptable for Xpert Xpress SARS-CoV-2/FLU/RSV testing.  Fact Sheet for Patients: EntrepreneurPulse.com.au  Fact Sheet for Healthcare Providers: IncredibleEmployment.be  This test is not yet approved or cleared by the [place] FDA and has been authorized for detection and/or diagnosis of SARS-CoV-2 by FDA under an Emergency Use Authorization (EUA). This EUA will remain in effect (meaning this test can be used) for the duration of the COVID-19 declaration under Section 564(b)(1) of the Act, 21 U.S.C. section  360bbb-3(b)(1), unless the authorization is terminated or revoked.  Performed at [facility], [place] [address] [place], [place] [address]   Blood Culture (routine x 2)     Status: None   Collection Time: 10/12/20 12:05 PM   Specimen: BLOOD  Result Value Ref Range Status   Specimen Description BLOOD SITE NOT SPECIFIED  Final   Special Requests   Final    BOTTLES DRAWN AEROBIC AND ANAEROBIC Blood Culture results may not be optimal due to an inadequate volume of blood received in culture bottles   Culture   Final    NO GROWTH 5 DAYS Performed at [facility], [place] [address], [place], [place] [address]    Report Status 10/17/2020 FINAL  Final  Blood Culture (routine x 2)     Status: None   Collection Time: 10/12/20 12:05 PM   Specimen: BLOOD  Result Value Ref Range Status   Specimen Description BLOOD SITE NOT SPECIFIED  Final   Special Requests   Final    BOTTLES DRAWN AEROBIC AND ANAEROBIC Blood Culture results may not be optimal due to an inadequate volume of blood received in culture bottles   Culture   Final    NO GROWTH 5 DAYS Performed at [facility], [place] [address], [place], [place] [address]    Report Status 10/17/2020 FINAL  Final  Surgical pcr screen     Status: Abnormal   Collection Time: 10/14/20  8:57 PM   Specimen: Nasal Mucosa; Nasal Swab  Result Value Ref Range Status   MRSA, PCR POSITIVE (A) NEGATIVE Final    Comment: RESULT CALLED TO, READ BACK BY AND VERIFIED WITH: C SISON RN 10/14/20 2306 JDW    Staphylococcus aureus POSITIVE (A) NEGATIVE Final    Comment: (NOTE) The Xpert SA Assay (FDA approved for NASAL specimens in patients 60 years of age and older), is one component of a comprehensive surveillance program. It is not intended to diagnose infection nor to guide or monitor treatment. Performed at [facility], [place] [address] [place], [place] [address]      Labs: BNP (last 3 results) No results for input(s): BNP in the last  8760 hours. Basic Metabolic Panel: Recent Labs  Lab 10/16/20 0111 10/17/20 0146 10/18/20 0047  NA 135 138 139  K 4.0 3.9 4.1  CL 106 107 109  CO2 21* 23 23  GLUCOSE 286* 206* 143*  BUN 24* 27* 20  CREATININE 1.39* 1.23* 1.01*  CALCIUM 8.5* 8.7* 8.9   Liver Function Tests: No results for input(s): AST, ALT, ALKPHOS, BILITOT, PROT, ALBUMIN in the last 168 hours. No results for input(s): LIPASE, AMYLASE in the last 168 hours. No results for input(s): AMMONIA in the last 168 hours. CBC: Recent Labs  Lab 10/16/20 0111  10/17/20 0146 10/18/20 0047 10/19/20 0103  WBC 9.3 8.9 9.0 10.3  HGB 8.4* 8.1* 8.2* 8.6*  HCT 24.9* 24.4* 24.6* 26.2*  MCV 85.0 85.9 85.4 85.9  PLT 241 264 285 293   Cardiac Enzymes: No results for input(s): CKTOTAL, CKMB, CKMBINDEX, TROPONINI in the last 168 hours. BNP: Invalid input(s): POCBNP CBG: Recent Labs  Lab 10/21/20 0738 10/21/20 1133 10/21/20 1608 10/21/20 2058 10/22/20 0730  GLUCAP 226* 180* 272* 225* 186*   D-Dimer No results for input(s): DDIMER in the last 72 hours. Hgb A1c No results for input(s): HGBA1C in the last 72 hours. Lipid Profile No results for input(s): CHOL, HDL, LDLCALC, TRIG, CHOLHDL, LDLDIRECT in the last 72 hours. Thyroid function studies No results for input(s): TSH, T4TOTAL, T3FREE, THYROIDAB in the last 72 hours.  Invalid input(s): FREET3 Anemia work up No results for input(s): VITAMINB12, FOLATE, FERRITIN, TIBC, IRON, RETICCTPCT in the last 72 hours. Urinalysis    Component Value Date/Time   COLORURINE YELLOW 10/12/2020 1653   APPEARANCEUR CLEAR 10/12/2020 1653   LABSPEC 1.011 10/12/2020 1653   PHURINE 5.0 10/12/2020 1653   GLUCOSEU NEGATIVE 10/12/2020 1653   HGBUR NEGATIVE 10/12/2020 1653   BILIRUBINUR NEGATIVE 10/12/2020 1653   BILIRUBINUR negative 05/31/2020 1151   KETONESUR 5 (A) 10/12/2020 1653   PROTEINUR NEGATIVE 10/12/2020 1653   UROBILINOGEN 0.2 05/31/2020 1151   UROBILINOGEN 0.2 10/14/2012  1009   NITRITE NEGATIVE 10/12/2020 1653   LEUKOCYTESUR NEGATIVE 10/12/2020 1653   Sepsis Labs Invalid input(s): PROCALCITONIN,  WBC,  LACTICIDVEN Microbiology Recent Results (from the past 240 hour(s))  Resp Panel by RT-PCR (Flu A&B, Covid) Nasopharyngeal Swab     Status: None   Collection Time: 10/12/20 12:05 PM   Specimen: Nasopharyngeal Swab; Nasopharyngeal(NP) swabs in vial transport medium  Result Value Ref Range Status   SARS Coronavirus 2 by RT PCR NEGATIVE NEGATIVE Final    Comment: (NOTE) SARS-CoV-2 target nucleic acids are NOT DETECTED.  The SARS-CoV-2 RNA is generally detectable in upper respiratory specimens during the acute phase of infection. The lowest concentration of SARS-CoV-2 viral copies this assay can detect is 138 copies/mL. A negative result does not preclude SARS-Cov-2 infection and should not be used as the sole basis for treatment or other patient management decisions. A negative result may occur with  improper specimen collection/handling, submission of specimen other than nasopharyngeal swab, presence of viral mutation(s) within the areas targeted by this assay, and inadequate number of viral copies(<138 copies/mL). A negative result must be combined with clinical observations, patient history, and epidemiological information. The expected result is Negative.  Fact Sheet for Patients:  EntrepreneurPulse.com.au  Fact Sheet for Healthcare Providers:  IncredibleEmployment.be  This test is no t yet approved or cleared by the [place] FDA and  has been authorized for detection and/or diagnosis of SARS-CoV-2 by FDA under an Emergency Use Authorization (EUA). This EUA will remain  in effect (meaning this test can be used) for the duration of the COVID-19 declaration under Section 564(b)(1) of the Act, 21 U.S.C.section 360bbb-3(b)(1), unless the authorization is terminated  or revoked sooner.       Influenza A by  PCR NEGATIVE NEGATIVE Final   Influenza B by PCR NEGATIVE NEGATIVE Final    Comment: (NOTE) The Xpert Xpress SARS-CoV-2/FLU/RSV plus assay is intended as an aid in the diagnosis of influenza from Nasopharyngeal swab specimens and should not be used as a sole basis for treatment. Nasal washings and aspirates are unacceptable for Xpert Xpress SARS-CoV-2/FLU/RSV testing.  Fact Sheet for Patients: EntrepreneurPulse.com.au  Fact Sheet for Healthcare Providers: IncredibleEmployment.be  This test is not yet approved or cleared by the [place] FDA and has been authorized for detection and/or diagnosis of SARS-CoV-2 by FDA under an Emergency Use Authorization (EUA). This EUA will remain in effect (meaning this test can be used) for the duration of the COVID-19 declaration under Section 564(b)(1) of the Act, 21 U.S.C. section 360bbb-3(b)(1), unless the authorization is terminated or revoked.  Performed at [facility], [place] [address], [place], [place] [address]   Blood Culture (routine x 2)     Status: None   Collection Time: 10/12/20 12:05 PM   Specimen: BLOOD  Result Value Ref Range Status   Specimen Description BLOOD SITE NOT SPECIFIED  Final   Special Requests   Final    BOTTLES DRAWN AEROBIC AND ANAEROBIC Blood Culture results may not be optimal due to an inadequate volume of blood received in culture bottles   Culture   Final    NO GROWTH 5 DAYS Performed at [facility], [place] [address] [place], [place] [address]    Report Status 10/17/2020 FINAL  Final  Blood Culture (routine x 2)     Status: None   Collection Time: 10/12/20 12:05 PM   Specimen: BLOOD  Result Value Ref Range Status   Specimen Description BLOOD SITE NOT SPECIFIED  Final   Special Requests   Final    BOTTLES DRAWN AEROBIC AND ANAEROBIC Blood Culture results may not be optimal due to an inadequate volume of blood received in culture bottles   Culture   Final     NO GROWTH 5 DAYS Performed at [facility], [place] [address], [place], [place] [address]    Report Status 10/17/2020 FINAL  Final  Surgical pcr screen     Status: Abnormal   Collection Time: 10/14/20  8:57 PM   Specimen: Nasal Mucosa; Nasal Swab  Result Value Ref Range Status   MRSA, PCR POSITIVE (A) NEGATIVE Final    Comment: RESULT CALLED TO, READ BACK BY AND VERIFIED WITH: C SISON RN 10/14/20 2306 JDW    Staphylococcus aureus POSITIVE (A) NEGATIVE Final    Comment: (NOTE) The Xpert SA Assay (FDA approved for NASAL specimens in patients 27 years of age and older), is one component of a comprehensive surveillance program. It is not intended to diagnose infection nor to guide or monitor treatment. Performed at [facility], [place] [address], [place], [place] [address]      Time coordinating discharge: 45 minutes  SIGNED:   Tawni Millers, MD  [facility] 10/22/2020, 9:34 AM

## 2020-10-22 NOTE — Chronic Care Management (AMB) (Signed)
   Chronic Care Management    10/22/2020 Name: Teresa Golden MRN: [id number] DOB: February 24, 1946  Teresa Golden is a 75 y.o. year old female who is a primary care patient of Glendale Chard, MD. Teresa Golden is actively engaged with the embedded care management team in the primary care practice and is being followed by  RN Case Manager, SW, and Pharmacist  for assistance with disease management and care coordination needs related to HTN, DM, and CKD Stage III .   Teresa Golden is currently admitted to the hospital for evaluation and treatment of  wound infection following panniculectomy .   SW spoke with the patient. Patient reports she is anxious and does not want to attend rehab, she would rather go home with home health. Patient stated she spoke with the owner of "[employer]" who said they have a staff member who can come care for the patient in her home each day and the company will accept patients insurance.  Reviewed the importance of following recommendations for rehab. Discussed the patients health plan may not pay for home health daily and the patient would be responsible. Advised the patient if rehab is recommended it is based on the needs of the patient. Provided education that Medicare covers rehab following inpatient stays and that if the patient decides to go home she will not be able to enter rehab if she determines she can not remain safely in her home. Discussed that once the patient begins improving she can return to her home safely post rehab stay. Patient stated understanding.  Collaboration with patients primary provider and RN Care Manager to advise of patients concern with attending rehab.  Plan: Patient will attend rehab has recommended. SW will continue to follow.    Daneen Schick, BSW, CDP Social Worker, Certified Dementia Practitioner [place] [facility] [place] [facility] [phone]

## 2020-10-22 NOTE — Progress Notes (Signed)
Patient discharged to [facility] facility as ordered, reported to [facility] LPN, all belonings sent with patient, transported via [place].

## 2020-10-22 NOTE — Progress Notes (Signed)
7 Days Post-Op  Subjective:  Patient is resting comfortably in bed, plan is for discharge shortly.  She is fully dressed.  She denies any recent fevers, chills, or new or worsening abdominal pain.  She is 1 week s/p debridement.  There is a new Prevena wound VAC placed earlier today.  There does appear to be an air leak bilaterally which it is not actively draining.  We will secure with derma tack /wound VAC tape.  Objective: Vital signs in last 24 hours: Temp:  [97.8 F (36.6 C)-98.6 F (37 C)] 98.6 F (37 C) (08/29 0734) Pulse Rate:  [58-73] 58 (08/29 0734) Resp:  [17-19] 17 (08/29 0358) BP: (144-172)/(55-77) 155/55 (08/29 0734) SpO2:  [98 %-100 %] 100 % (08/29 0734) Last BM Date: 10/22/20  Intake/Output from previous day: 08/28 0701 - 08/29 0700 In: 700 [P.O.:700] Out: 1 [Stool:1] Intake/Output this shift: Total I/O In: 480 [P.O.:480] Out: 1 [Stool:1]  General appearance: alert and cooperative GI: Nondistended.  Newly placed wound VAC in place.  There is an air leak on the lateral aspects bilaterally. Pulses: 2+ and symmetric  Lab Results:  CBC Latest Ref Rng & Units 10/19/2020 10/18/2020 10/17/2020  WBC 4.0 - 10.5 K/uL 10.3 9.0 8.9  Hemoglobin 12.0 - 15.0 g/dL 8.6(L) 8.2(L) 8.1(L)  Hematocrit 36.0 - 46.0 % 26.2(L) 24.6(L) 24.4(L)  Platelets 150 - 400 K/uL 293 285 264    BMET No results for input(s): NA, K, CL, CO2, GLUCOSE, BUN, CREATININE, CALCIUM in the last 72 hours. PT/INR No results for input(s): LABPROT, INR in the last 72 hours. ABG No results for input(s): PHART, HCO3 in the last 72 hours.  Invalid input(s): PCO2, PO2  Studies/Results: No results found.  Anti-infectives: Anti-infectives (From admission, onward)    Start     Dose/Rate Route Frequency Ordered Stop   10/22/20 0000  doxycycline (VIBRA-TABS) 100 MG tablet        100 mg Oral Every 12 hours 10/22/20 1000 10/27/20 2359   10/22/20 0000  amoxicillin-clavulanate (AUGMENTIN) 875-125 MG tablet         1 tablet Oral 2 times daily 10/22/20 1000 10/27/20 2359   10/18/20 2200  doxycycline (VIBRA-TABS) tablet 100 mg        100 mg Oral Every 12 hours 10/18/20 1330 10/23/20 0959   10/18/20 2200  amoxicillin-clavulanate (AUGMENTIN) 875-125 MG per tablet 1 tablet        1 tablet Oral 2 times daily 10/18/20 1330 10/23/20 0959   10/15/20 1255  ceFAZolin 1 g / gentamicin 80 mg in NS 500 mL surgical irrigation  Status:  Discontinued          As needed 10/15/20 1255 10/15/20 1441   10/15/20 0745  ceFAZolin (ANCEF) IVPB 2g/100 mL premix        2 g 200 mL/hr over 30 Minutes Intravenous On call to O.R. 10/15/20 [id number] 10/15/20 1325   10/13/20 1600  cefTRIAXone (ROCEPHIN) 2 g in sodium chloride 0.9 % 100 mL IVPB  Status:  Discontinued        2 g 200 mL/hr over 30 Minutes Intravenous Every 24 hours 10/12/20 1606 10/18/20 1330   10/13/20 1400  vancomycin (VANCOREADY) IVPB 750 mg/150 mL  Status:  Discontinued        750 mg 150 mL/hr over 60 Minutes Intravenous Every 24 hours 10/12/20 1314 10/18/20 1330   10/13/20 0000  clindamycin (CLEOCIN) IVPB 900 mg  Status:  Discontinued        900 mg 100  mL/hr over 30 Minutes Intravenous Every 8 hours 10/12/20 1626 10/16/20 0916   10/12/20 2200  clindamycin (CLEOCIN) IVPB 600 mg  Status:  Discontinued        600 mg 100 mL/hr over 30 Minutes Intravenous Every 8 hours 10/12/20 1606 10/12/20 1626   10/12/20 1530  clindamycin (CLEOCIN) IVPB 600 mg        600 mg 100 mL/hr over 30 Minutes Intravenous  Once 10/12/20 1519 10/12/20 1935   10/12/20 1200  vancomycin (VANCOREADY) IVPB 1500 mg/300 mL        1,500 mg 150 mL/hr over 120 Minutes Intravenous  Once 10/12/20 1153 10/12/20 1743   10/12/20 1200  cefTRIAXone (ROCEPHIN) 2 g in sodium chloride 0.9 % 100 mL IVPB        2 g 200 mL/hr over 30 Minutes Intravenous  Once 10/12/20 1153 10/12/20 1253       Assessment/Plan: s/p Procedure(s): IRRIGATION AND DEBRIDEMENT WOUND  Patient is a 75 year old female s/p  debridement and irrigation of abdomen.  She had a panniculectomy on 09/26/2020 and was subsequently admitted for symptomatic anemia and sepsis.   I spoke with RN who will fix the air leak by applying additional wound VAC tape laterally.  On my exam she is non-ill-appearing.  No new or worsening abdominal discomfort.  Eating and drinking.  Positive bowel movements.  She will need to have continued wound VAC care at the skilled nursing facility.  She understands that she can call the office should she develop any questions or concerns.   LOS: 10 days    Krista Blue, PA-C 10/22/2020

## 2020-10-22 NOTE — TOC Transition Note (Addendum)
Transition of Care [facility]) - CM/SW Discharge Note   Patient Details  Name: Teresa Golden MRN: [id number] Date of Birth: 1945/06/17  Transition of Care [facility] CM/SW Contact:  Emeterio Reeve, LCSW Phone Number: 10/22/2020, 2:11 PM   Clinical Narrative:     Patient will DC to: [facility] Anticipated DC date: 10/22/20 Family notified: Pt will notify Transport by: Corey Harold     Per MD patient ready for DC to [place] [place]. RN, patient, patient's family, and facility notified of DC. Discharge Summary and FL2 sent to facility. DC packet on chart. Insurance Josem Kaufmann has been received and pt is covid negative. Ambulance transport requested for patient.    RN to call report to [phone]  CSW will sign off for now as social work intervention is no longer needed. Please consult [place] again if new needs arise.   Final next level of care: Skilled Nursing Facility Barriers to Discharge: Barriers Resolved   Patient Goals and CMS Choice   CMS Medicare.gov Compare Post Acute Care list provided to:: Patient Choice offered to / list presented to : Patient  Discharge Placement              Patient chooses bed at: Other - please specify in the comment section below: ([facility]) Patient to be transferred to facility by: [facility] Name of family member notified: Pt will notify Patient and family notified of of transfer: 10/22/20  Discharge Plan and Services                                     Social Determinants of Health (SDOH) Interventions     Readmission Risk Interventions No flowsheet data found.   Emeterio Reeve, [place] Clinical Social Worker [phone]

## 2020-10-23 ENCOUNTER — Telehealth: Payer: Self-pay

## 2020-10-23 ENCOUNTER — Other Ambulatory Visit: Payer: Self-pay | Admitting: Physician Assistant

## 2020-10-23 ENCOUNTER — Telehealth: Payer: Self-pay | Admitting: Plastic Surgery

## 2020-10-23 NOTE — Telephone Encounter (Signed)
Patient needs wound vac orders sent to [facility]. Please call to update/ advise. SX 8/3. Thanks.   [place] P:[phone]

## 2020-10-23 NOTE — Telephone Encounter (Signed)
Called pt to notify about patient assistance. She can come by the office to pick up.

## 2020-10-23 NOTE — Telephone Encounter (Signed)
Please review. Thanks! Let me know if I can do anything to assist.

## 2020-10-24 DIAGNOSIS — N183 Chronic kidney disease, stage 3 unspecified: Secondary | ICD-10-CM | POA: Diagnosis not present

## 2020-10-24 DIAGNOSIS — E1122 Type 2 diabetes mellitus with diabetic chronic kidney disease: Secondary | ICD-10-CM

## 2020-10-24 DIAGNOSIS — I129 Hypertensive chronic kidney disease with stage 1 through stage 4 chronic kidney disease, or unspecified chronic kidney disease: Secondary | ICD-10-CM | POA: Diagnosis not present

## 2020-10-24 DIAGNOSIS — N179 Acute kidney failure, unspecified: Secondary | ICD-10-CM

## 2020-10-24 DIAGNOSIS — Z794 Long term (current) use of insulin: Secondary | ICD-10-CM

## 2020-10-24 DIAGNOSIS — N182 Chronic kidney disease, stage 2 (mild): Secondary | ICD-10-CM

## 2020-10-25 ENCOUNTER — Ambulatory Visit: Payer: Medicare HMO | Admitting: Addiction (Substance Use Disorder)

## 2020-10-25 ENCOUNTER — Ambulatory Visit (INDEPENDENT_AMBULATORY_CARE_PROVIDER_SITE_OTHER): Payer: Medicare HMO

## 2020-10-25 ENCOUNTER — Ambulatory Visit (INDEPENDENT_AMBULATORY_CARE_PROVIDER_SITE_OTHER): Payer: Medicare HMO | Admitting: Addiction (Substance Use Disorder)

## 2020-10-25 ENCOUNTER — Telehealth: Payer: Self-pay | Admitting: Plastic Surgery

## 2020-10-25 DIAGNOSIS — Z794 Long term (current) use of insulin: Secondary | ICD-10-CM

## 2020-10-25 DIAGNOSIS — T8149XA Infection following a procedure, other surgical site, initial encounter: Secondary | ICD-10-CM

## 2020-10-25 DIAGNOSIS — N183 Chronic kidney disease, stage 3 unspecified: Secondary | ICD-10-CM

## 2020-10-25 DIAGNOSIS — F4323 Adjustment disorder with mixed anxiety and depressed mood: Secondary | ICD-10-CM

## 2020-10-25 DIAGNOSIS — N182 Chronic kidney disease, stage 2 (mild): Secondary | ICD-10-CM

## 2020-10-25 DIAGNOSIS — E1122 Type 2 diabetes mellitus with diabetic chronic kidney disease: Secondary | ICD-10-CM

## 2020-10-25 NOTE — Chronic Care Management (AMB) (Signed)
Chronic Care Management    Social Work Note  10/25/2020 Name: Teresa Golden MRN: [id number] DOB: August 21, 1945  Teresa Golden is a 75 y.o. year old female who is a primary care patient of Glendale Chard, MD. The CCM team was consulted to assist the patient with chronic disease management and/or care coordination needs related to:  DM II, CKD III, and HTN .   Engaged with patient by telephone for follow up visit in response to provider referral for social work chronic care management and care coordination services.   Consent to Services:  The patient was given information about Chronic Care Management services, agreed to services, and gave verbal consent prior to initiation of services.  Please see initial visit note for detailed documentation.   Patient agreed to services and consent obtained.   Assessment: Review of patient past medical history, allergies, medications, and health status, including review of relevant consultants reports was performed today as part of a comprehensive evaluation and provision of chronic care management and care coordination services.     SDOH (Social Determinants of Health) assessments and interventions performed:    Advanced Directives Status: Not addressed in this encounter.  CCM Care Plan  Allergies  Allergen Reactions   Meloxicam Other (See Comments)    Fever; muscle aches; "flu-like" symptoms   Other     Rose fever and hay fever     Outpatient Encounter Medications as of 10/25/2020  Medication Sig Note   acetaminophen (TYLENOL) 325 MG tablet Take 2 tablets (650 mg total) by mouth every 6 (six) hours as needed for moderate pain or mild pain.    amLODipine (NORVASC) 5 MG tablet Take 1 tablet by mouth once daily    amoxicillin-clavulanate (AUGMENTIN) 875-125 MG tablet Take 1 tablet by mouth 2 (two) times daily for 5 days.    B Complex-C (B-COMPLEX WITH VITAMIN C) tablet Take 1 tablet by mouth daily.    Biotin 10000 MCG TABS Take 10,000 mcg by mouth  daily.    Cholecalciferol 5000 units TABS Take 5,000 Units by mouth daily.    Colchicine 0.6 MG CAPS TAKE 1 CAPSULE DAILY AS    NEEDED (Patient taking differently: Take 0.6 mg by mouth daily as needed (pain).) 06/20/2020: Patient takes as needed   Cranberry-Vitamin C 250-60 MG CAPS Take 1 tablet by mouth daily.    diclofenac Sodium (VOLTAREN) 1 % GEL APPLY 2 GRAMS TO AFFECTED AREA(S) 4 TIMES DAILY AS NEEDED (Patient taking differently: Apply 2 g topically 4 (four) times daily as needed (pain).)    doxycycline (VIBRA-TABS) 100 MG tablet Take 1 tablet (100 mg total) by mouth every 12 (twelve) hours for 5 days.    gabapentin (NEURONTIN) 300 MG capsule Take 600 mg by mouth at bedtime.    glucose blood (ONETOUCH VERIO) test strip Use as instructed to check blood sugars 2 times per day dx: e11.65    HYDROcodone-acetaminophen (NORCO/VICODIN) 5-325 MG tablet Take 1 tablet by mouth every 6 (six) hours as needed for severe pain.    insulin degludec (TRESIBA FLEXTOUCH) 100 UNIT/ML SOPN FlexTouch Pen Inject 0.09 mLs (9 Units total) into the skin daily at 10 pm.    losartan (COZAAR) 100 MG tablet Take 100 mg by mouth daily.    metoprolol succinate (TOPROL-XL) 25 MG 24 hr tablet 1 tablet by mouth daily at bedtime    Multiple Vitamins-Minerals (MULTIVITAMIN WITH MINERALS) tablet Take 1 tablet by mouth daily. Women's 50+    ondansetron (ZOFRAN) 4 MG tablet  TAKE 1 TABLET BY MOUTH EVERY 8 HOURS AS NEEDED FOR NAUSEA FOR VOMITING (Patient taking differently: Take 4 mg by mouth every 8 (eight) hours as needed for nausea or vomiting.)    OneTouch Delica Lancets [id number] MISC Use as instructed to check blood sugars 2 times per day dx: e11.65    OZEMPIC, 1 MG/DOSE, 2 MG/1.5ML SOPN INJECT INTO SKIN ONCE A WEEK (Patient taking differently: Inject 1 mg into the skin once a week.)    polyethylene glycol (MIRALAX / GLYCOLAX) 17 g packet Take 17 g by mouth daily.    simvastatin (ZOCOR) 10 MG tablet Take 1 tablet (10 mg total) by mouth  daily.    SYNTHROID 25 MCG tablet Take 0.5 tablet (12.5 mcg) by mouth on Mondays through Fridays, then take 1 tablet (25 mcg) by mouth on Saturdays and Sundays.    No facility-administered encounter medications on file as of 10/25/2020.    Patient Active Problem List   Diagnosis Date Noted   Pressure injury of skin 10/22/2020   Wound infection after surgery 10/12/2020   Normocytic anemia 10/12/2020   AKI (acute kidney injury) ([place]) 10/12/2020   Obesity (BMI 30-39.9) 10/12/2020   Sepsis ([place]) 10/12/2020   Type 2 diabetes mellitus with stage 3 chronic kidney disease, with long-term current use of insulin ([place]) 11/29/2019   Panniculitis 09/30/2019   Back pain 09/30/2019   Hepatoma ([place]) 01/21/2019   Other cirrhosis of liver ([place]) 07/05/2018   Hypertensive nephropathy 07/05/2018   Chronic renal disease, stage III ([place]) 07/05/2018   Chronic left shoulder pain 07/05/2018   Hypothyroidism 02/06/2018   Hepatitis C virus infection cured after antiviral drug therapy 02/04/2018   Osteoarthritis of right knee 07/30/2017   S/P laparoscopic sleeve gastrectomy July 2018 08/26/2016   Preop cardiovascular exam 07/30/2016   Dyspnea on exertion 07/30/2016   Bilateral lower extremity edema 07/30/2016    Conditions to be addressed/monitored: HTN, DMII, and CKD Stage III ;  caregiver needs post hospitalization  Care Plan : Social Work Care Plan  Updates made by Daneen Schick since 10/25/2020 12:00 AM     Problem: Care Coordination      Long-Range Goal: Collaborate with RN Care Manager to perform appropriate assessments to assist with care coordination needs   Start Date: 03/02/2020  Recent Progress: On track  Priority: Low  Note:   Current Barriers:  Chronic conditions including DM II, CKD III, and HTN which put patient at increased risk of hospitalization Recent hospitalization due to wound infection [place] panniculectomy  Limited access to a caregiver  Social Work Clinical Goal(s):  patient  will work with SW to address concerns related to DM II, CKD III, and HTN and manage care coordination needs Patient will follow the recommendations of her provider as evidenced by remaining in rehab until appropriate for discharge  Interventions: 1:1 collaboration with Glendale Chard, MD regarding development and update of comprehensive plan of care as evidenced by provider attestation and co-signature Inter-disciplinary care team collaboration (see longitudinal plan of care) Successful outbound call placed to the patient in response to a voice message received Determined the patient remains in rehab at [facility] following recent hospitalization Discussed the patient has been in rehab since Monday evening and had a bad day yesterday wanting to go home which is why the patient contacted SW Patient reports her wound vac was leaking and uncomfortable. After she contacted SW the wound care nurse did successfully change the patients wound vac which is no longer leaking Discussed the  patient is trying to go with the flow and participate in rehab. Patient stated "I know it is in my best interest to stay here for the recommended time" Encouraged the patient to contact SW as needed Collaboration with Dr. Baird Cancer and Barb Merino RN Care Manager to communicate patients plan to remain in rehab at this time  Patient Goals/Self-Care Activities patient will:  -Participate in rehab -Follow provider recommendation regarding wound care, therapy needs, and discharge recommendations -Contact SW as needed prior to next scheduled appointment  Follow up Plan: SW will follow up with patient by phone over the next 10 days       Follow Up Plan: SW will follow up with patient by phone over the next 10 days      Daneen Schick, BSW, CDP Social Worker, Certified Dementia Practitioner [place] [facility] [place] [facility] [phone]

## 2020-10-25 NOTE — Telephone Encounter (Signed)
Patient has a follow up appointment with Dr. Marla Roe tomorrow at 10:00. Will address order after visit.

## 2020-10-25 NOTE — Telephone Encounter (Signed)
Orders for wound vac have been requested for this patient. Thank you.  [place]

## 2020-10-25 NOTE — Patient Instructions (Signed)
Social Worker Visit Information  Goals we discussed today:   Goals Addressed             This Visit's Progress    Work with SW to manage care coordination needs       Timeframe:  Long-Range Goal Priority:  [employer] Start Date:   12.16.21                                           Next date of contact: 9.9.22  Patient Goals/Self-Care Activities patient will:  -Participate in rehab -Follow provider recommendation regarding wound care, therapy needs, and discharge recommendations -Contact SW as needed prior to next scheduled appointment         Follow Up Plan: SW will follow up with patient by phone over the next 10 days   Daneen Schick, BSW, CDP Social Worker, Certified Dementia Practitioner [place] [facility] [place] [facility] [phone]

## 2020-10-25 NOTE — Progress Notes (Signed)
[facility] Counselor/Therapist Progress Note  [name], MRN: [id number],    Date: 10/25/2020   Time Spent:  52mns  Treatment Type: Individual Therapy  Reported Symptoms: weak & feeling discouraged.  Mental Status Exam:  Appearance:   Well Groomed     Behavior:  Appropriate and Sharing  Motor:  Normal  Speech/Language:   Slow and Slurred  Affect:  Appropriate  Mood:  anxious and sad  Thought process:  normal  Thought content:    Rumination  Sensory/Perceptual disturbances:    WNL  Orientation:  x4  Attention:  Good  Concentration:  Good  Memory:  WNL  Fund of knowledge:   Good  Insight:    Fair  Judgment:   Fair  Impulse Control:  Fair   Risk Assessment: Danger to Self:  No Self-injurious Behavior: No Danger to Others: No Duty to Warn:no Physical Aggression / Violence:No  Access to Firearms a concern: No  Gang Involvement:No   Virtual Visit via TELEPHONE :  I connected with client by telephone, with their informed consent, and verified client privacy and that I am speaking with the correct person using two identifiers. I discussed the limitations, risks, security and privacy concerns of performing psychotherapy and management service virtually and confirmed their location. I also discussed with the patient that there may be a patient responsible charge related to this service and to confirm with the front desk if their insurance covers teletherapy. I also discussed with the patient the availability of in person appointments. The patient expressed understanding and agreed to proceed. I discussed the treatment planning with the client. The client was provided an opportunity to ask questions and all were answered. The client agreed with the plan and demonstrated an understanding of the instructions. The client was advised to call our office if symptoms worsen or feel they are in a crisis state and need immediate contact. Client also reminded of a crisis  line number and to use 9-1-1 if there's an emergency.  Therapist Location: office;  Client Location: private room in a rehab facility post surgery  Subjective: Client reported having a lot of surgery complications and being very anxious. Client had to have a second surgery and go to a rehab facility and fought it until recently when she realized if she didn't take the help, it could get infected again and she could pass. Client processed thorough her grief and heavy emotions and loneliness, and therapist used MI & grief therapy with client to validate her feelings, and support her as she processed her grief and difficulty with the recovery from this surgery. Client discussed her intense desire to get better quickly and return home, but therapist used SFT with client to help her reason and recognize her need to stay there & find a solution to help her feel more at ease at the facility. Therapist assessed for stability and client denied SI/HI/AVH.   Interventions: Cognitive Behavioral Therapy, Motivational Interviewing, Solution-Oriented/Positive Psychology, and RPT    Diagnosis:   ICD-10-CM   1. Adjustment disorder with mixed anxiety and depressed mood  F43.23       Plan of Care:  Client is to return to therapy with therapist every 1-2 weeks as needed to process traumas/ frustrations in a safe space, to be re-evaluated in 3 months.  Client is to practice mindfulness AEB daily meditation and body scans or as needed when flooded by emotion/pain.  Client is to learn/practice DBT wise mind &  radical acceptance. Client is to practice self-compassion AEB being gentle with themselves, utilizing self-care techniques daily or as needed when grieving something in the moment.  Client is to process grief/pain of their loss in a somatic body-felt sense way: ie using mindfulness, brainspotting, or trauma release as a method for releasing body pain/tension caused by grief.  Barnie Del, LCSW, LCAS, CCTP,  CCS-I, BSP

## 2020-10-26 ENCOUNTER — Other Ambulatory Visit: Payer: Self-pay

## 2020-10-26 ENCOUNTER — Ambulatory Visit (INDEPENDENT_AMBULATORY_CARE_PROVIDER_SITE_OTHER): Payer: Medicare HMO | Admitting: Plastic Surgery

## 2020-10-26 ENCOUNTER — Encounter: Payer: Self-pay | Admitting: Plastic Surgery

## 2020-10-26 DIAGNOSIS — T8149XA Infection following a procedure, other surgical site, initial encounter: Secondary | ICD-10-CM

## 2020-10-26 NOTE — Telephone Encounter (Signed)
Patient was seen in the office today by Dr. Marla Roe.    Wound Vac orders faxed:Vac to abd incision 138mHg continuous pressure.  Please change 2x/week  Follow-up with [place].  Per Dr. DMarla Roe Orders faxed to [facility]  Conformation received.//AB/CMA

## 2020-10-26 NOTE — Progress Notes (Signed)
The patient is a 75 year old female here for follow-up on her panniculectomy.  She had breakdown of her skin that required excision.  She is now in a rehab facility.  She is doing much better.  On exam the abdominal incision is to gather and showing signs of healing although slow.  The umbilicus has some fibrous tissue that will likely need to be debrided.  She has been using the VAC.  I think that we should continue this for another couple of weeks.  I would like to see her back in 2 weeks.

## 2020-10-29 ENCOUNTER — Encounter (HOSPITAL_COMMUNITY): Payer: Self-pay

## 2020-10-29 ENCOUNTER — Emergency Department (HOSPITAL_COMMUNITY)
Admission: EM | Admit: 2020-10-29 | Discharge: 2020-10-30 | Disposition: A | Discharge status: Home / Self Care | Payer: Medicare HMO | Source: Home / Self Care | Attending: Emergency Medicine | Admitting: Emergency Medicine

## 2020-10-29 ENCOUNTER — Other Ambulatory Visit: Payer: Self-pay

## 2020-10-29 DIAGNOSIS — Z4800 Encounter for change or removal of nonsurgical wound dressing: Secondary | ICD-10-CM | POA: Insufficient documentation

## 2020-10-29 DIAGNOSIS — N183 Chronic kidney disease, stage 3 unspecified: Secondary | ICD-10-CM | POA: Insufficient documentation

## 2020-10-29 DIAGNOSIS — E039 Hypothyroidism, unspecified: Secondary | ICD-10-CM | POA: Insufficient documentation

## 2020-10-29 DIAGNOSIS — Z96653 Presence of artificial knee joint, bilateral: Secondary | ICD-10-CM | POA: Insufficient documentation

## 2020-10-29 DIAGNOSIS — Z794 Long term (current) use of insulin: Secondary | ICD-10-CM | POA: Insufficient documentation

## 2020-10-29 DIAGNOSIS — B9689 Other specified bacterial agents as the cause of diseases classified elsewhere: Secondary | ICD-10-CM | POA: Insufficient documentation

## 2020-10-29 DIAGNOSIS — T8141XA Infection following a procedure, superficial incisional surgical site, initial encounter: Secondary | ICD-10-CM | POA: Diagnosis not present

## 2020-10-29 DIAGNOSIS — Z87891 Personal history of nicotine dependence: Secondary | ICD-10-CM | POA: Insufficient documentation

## 2020-10-29 DIAGNOSIS — E1122 Type 2 diabetes mellitus with diabetic chronic kidney disease: Secondary | ICD-10-CM | POA: Insufficient documentation

## 2020-10-29 DIAGNOSIS — Z79899 Other long term (current) drug therapy: Secondary | ICD-10-CM | POA: Insufficient documentation

## 2020-10-29 DIAGNOSIS — I129 Hypertensive chronic kidney disease with stage 1 through stage 4 chronic kidney disease, or unspecified chronic kidney disease: Secondary | ICD-10-CM | POA: Insufficient documentation

## 2020-10-29 DIAGNOSIS — N390 Urinary tract infection, site not specified: Secondary | ICD-10-CM

## 2020-10-29 LAB — URINALYSIS, ROUTINE W REFLEX MICROSCOPIC
Bilirubin Urine: NEGATIVE
Glucose, UA: NEGATIVE mg/dL
Hgb urine dipstick: NEGATIVE
Ketones, ur: NEGATIVE mg/dL
Nitrite: NEGATIVE
Protein, ur: NEGATIVE mg/dL
Specific Gravity, Urine: 1.009 (ref 1.005–1.030)
pH: 5 (ref 5.0–8.0)

## 2020-10-29 LAB — CBC WITH DIFFERENTIAL/PLATELET
Abs Immature Granulocytes: 0.02 10*3/uL (ref 0.00–0.07)
Basophils Absolute: 0.1 10*3/uL (ref 0.0–0.1)
Basophils Relative: 1 %
Eosinophils Absolute: 0.2 10*3/uL (ref 0.0–0.5)
Eosinophils Relative: 2 %
HCT: 28.4 % — ABNORMAL LOW (ref 36.0–46.0)
Hemoglobin: 9.1 g/dL — ABNORMAL LOW (ref 12.0–15.0)
Immature Granulocytes: 0 %
Lymphocytes Relative: 29 %
Lymphs Abs: 2.5 10*3/uL (ref 0.7–4.0)
MCH: 28 pg (ref 26.0–34.0)
MCHC: 32 g/dL (ref 30.0–36.0)
MCV: 87.4 fL (ref 80.0–100.0)
Monocytes Absolute: 1.4 10*3/uL — ABNORMAL HIGH (ref 0.1–1.0)
Monocytes Relative: 16 %
Neutro Abs: 4.5 10*3/uL (ref 1.7–7.7)
Neutrophils Relative %: 52 %
Platelets: 239 10*3/uL (ref 150–400)
RBC: 3.25 MIL/uL — ABNORMAL LOW (ref 3.87–5.11)
RDW: 16.2 % — ABNORMAL HIGH (ref 11.5–15.5)
WBC: 8.7 10*3/uL (ref 4.0–10.5)
nRBC: 0 % (ref 0.0–0.2)

## 2020-10-29 LAB — COMPREHENSIVE METABOLIC PANEL
ALT: 11 U/L (ref 0–44)
AST: 15 U/L (ref 15–41)
Albumin: 2.4 g/dL — ABNORMAL LOW (ref 3.5–5.0)
Alkaline Phosphatase: 59 U/L (ref 38–126)
Anion gap: 8 (ref 5–15)
BUN: 9 mg/dL (ref 8–23)
CO2: 26 mmol/L (ref 22–32)
Calcium: 8.9 mg/dL (ref 8.9–10.3)
Chloride: 103 mmol/L (ref 98–111)
Creatinine, Ser: 1.11 mg/dL — ABNORMAL HIGH (ref 0.44–1.00)
GFR, Estimated: 52 mL/min — ABNORMAL LOW (ref >60)
Glucose, Bld: 116 mg/dL — ABNORMAL HIGH (ref 70–99)
Potassium: 3.8 mmol/L (ref 3.5–5.1)
Sodium: 137 mmol/L (ref 135–145)
Total Bilirubin: 0.6 mg/dL (ref 0.3–1.2)
Total Protein: 5.7 g/dL — ABNORMAL LOW (ref 6.5–8.1)

## 2020-10-29 LAB — LACTIC ACID, PLASMA: Lactic Acid, Venous: 1.1 mmol/L (ref 0.5–1.9)

## 2020-10-29 MED ORDER — CEPHALEXIN 250 MG PO CAPS: 250.0000 mg | ORAL_CAPSULE | Freq: Four times a day (QID) | ORAL | 0 refills | Status: DC

## 2020-10-29 MED ORDER — SODIUM CHLORIDE 0.9 % IV BOLUS
500.0000 mL | Freq: Once | INTRAVENOUS | Status: AC
  Administered 2020-10-29: 500 mL via INTRAVENOUS

## 2020-10-29 MED ORDER — SODIUM CHLORIDE 0.9 % IV BOLUS
1000.0000 mL | Freq: Once | INTRAVENOUS | Status: AC
  Administered 2020-10-29: 1000 mL via INTRAVENOUS

## 2020-10-29 NOTE — ED Notes (Signed)
Pt abdominal wound is erythematous. Upon palpation pt experienced left sided tenderness.

## 2020-10-29 NOTE — ED Provider Notes (Signed)
[facility] EMERGENCY DEPARTMENT Provider Note   CSN: [id number] Arrival date & time: 10/29/20  1445     History Chief Complaint  Patient presents with   Wound Infection    Teresa Golden is a 75 y.o. female with PMHx HTN, T2DM, hypothyroidism, obesity who presents for evaluation of wound check.   Of note, patient was recently discharged following hospital admission for wound infection following elective panniculectomy with plastic surgery on 8/3.  She had a prolonged hospital admission from 8/19 through 8/29 where she received broad-spectrum antibiotics, surgical debridement with wound VAC placement, and was discharged to SNF on both doxycycline and Augmentin to complete course on 9/3.  In the interim, the patient states that she feels she has been improving; however, she does note some intermittent chills.  No fevers.  She has not noticed any increased redness of her wound site, but SNF staff endorsed concern for wound infection and advised her to present to our emergency department for further evaluation.     Past Medical History:  Diagnosis Date   Arthritis    Chronic kidney disease    self reports ckd stage 3    Depression    Diabetes mellitus, type II, insulin dependent ([place])    With neurologic complications. 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    Hypertensive nephropathy 07/05/2018   Hypothyroidism    Hypothyroidism 02/06/2018   Morbid obesity with BMI of 50.0-59.9, adult [facility])    Scoliosis     Patient Active Problem List   Diagnosis Date Noted   Pressure injury of skin 10/22/2020   Wound infection after surgery 10/12/2020   Normocytic anemia 10/12/2020   AKI (acute kidney injury) ([place]) 10/12/2020    Obesity (BMI 30-39.9) 10/12/2020   Sepsis ([place]) 10/12/2020   Type 2 diabetes mellitus with stage 3 chronic kidney disease, with long-term current use of insulin ([place]) 11/29/2019   Panniculitis 09/30/2019   Back pain 09/30/2019   Hepatoma ([place]) 01/21/2019   Other cirrhosis of liver ([place]) 07/05/2018   Hypertensive nephropathy 07/05/2018   Chronic renal disease, stage III ([place]) 07/05/2018   Chronic left shoulder pain 07/05/2018   Hypothyroidism 02/06/2018   Hepatitis C virus infection cured after antiviral drug therapy 02/04/2018   Osteoarthritis of right knee 07/30/2017   S/P laparoscopic sleeve gastrectomy July 2018 08/26/2016   Preop cardiovascular exam 07/30/2016   Dyspnea on exertion 07/30/2016   Bilateral lower extremity edema 07/30/2016    Past Surgical History:  Procedure Laterality Date   ABDOMINAL HYSTERECTOMY     uterus   CHOLECYSTECTOMY     DIAGNOSTIC LAPAROSCOPY     DILATION AND CURETTAGE OF UTERUS     EYE SURGERY     cataract extraction bilateral    INCISION AND DRAINAGE OF WOUND N/A 10/15/2020   Procedure: IRRIGATION AND DEBRIDEMENT WOUND;  Surgeon: Wallace Going, DO;  Location: [facility];  Service: Plastics;  Laterality: N/A;  60 min   JOINT REPLACEMENT     Left knee   KNEE ARTHROPLASTY Right 07/30/2017   Procedure: RIGHT TOTAL KNEE ARTHROPLASTY WITH COMPUTER NAVIGATION;  Surgeon: Rod Can, MD;  Location: [facility];  Service: Orthopedics;  Laterality: Right;  Needs RNFA  LAPAROSCOPIC GASTRIC BANDING     and reversal   LAPAROSCOPIC GASTRIC SLEEVE RESECTION N/A 08/26/2016   Procedure: LAPAROSCOPIC GASTRIC SLEEVE RESECTION WITH UPPER ENOD;  Surgeon: Johnathan Hausen, MD;  Location: [facility];  Service: General;  Laterality: N/A;   PANNICULECTOMY N/A 09/26/2020   Procedure: PANNICULECTOMY;  Surgeon: Wallace Going, DO;  Location: [facility];  Service: Plastics;  Laterality: N/A;   TONSILLECTOMY     TRANSTHORACIC ECHOCARDIOGRAM  11/2013   EF 65-70%. Normal  diastolic Fxn.  Normal Valves.   UMBILICAL HERNIA REPAIR N/A 09/26/2020   Procedure: HERNIA REPAIR UMBILICAL ADULT;  Surgeon: Georganna Skeans, MD;  Location: [facility];  Service: General;  Laterality: N/A;     OB History   No obstetric history on file.     Family History  Problem Relation Age of Onset   Heart attack Mother    Heart failure Mother    Stroke Father     Social History   Tobacco Use   Smoking status: Former    Packs/day: 0.25    Years: 10.00    Pack years: 2.50    Types: Cigarettes   Smokeless tobacco: Never   Tobacco comments:    quit 20 years ago  Vaping Use   Vaping Use: Never used  Substance Use Topics   Alcohol use: Not Currently    Alcohol/week: 1.0 standard drink    Types: 1 Glasses of wine per week    Comment: occasional   Drug use: No    Home Medications Prior to Admission medications   Medication Sig Start Date End Date Taking? Authorizing Provider  cephALEXin (KEFLEX) 250 MG capsule Take 1 capsule (250 mg total) by mouth 4 (four) times daily for 5 days. 10/29/20 11/03/20 Yes Violet Baldy, MD  acetaminophen (TYLENOL) 325 MG tablet Take 2 tablets (650 mg total) by mouth every 6 (six) hours as needed for moderate pain or mild pain. 10/22/20   Arrien, Jimmy Picket, MD  amLODipine (NORVASC) 5 MG tablet Take 1 tablet by mouth once daily 08/21/20   Glendale Chard, MD  B Complex-C (B-COMPLEX WITH VITAMIN C) tablet Take 1 tablet by mouth daily.    [provider]  Biotin 10000 MCG TABS Take 10,000 mcg by mouth daily.    [provider]  Cholecalciferol 5000 units TABS Take 5,000 Units by mouth daily.    [provider]  Colchicine 0.6 MG CAPS TAKE 1 CAPSULE DAILY AS    NEEDED Patient taking differently: Take 0.6 mg by mouth daily as needed (pain). 10/26/18   Glendale Chard, MD  Cranberry-Vitamin C 250-60 MG CAPS Take 1 tablet by mouth daily.    [provider]  diclofenac Sodium (VOLTAREN) 1 % GEL APPLY 2 GRAMS TO AFFECTED  AREA(S) 4 TIMES DAILY AS NEEDED Patient taking differently: Apply 2 g topically 4 (four) times daily as needed (pain). 02/28/19   Glendale Chard, MD  gabapentin (NEURONTIN) 300 MG capsule Take 600 mg by mouth at bedtime. 08/12/16   [provider]  glucose blood (ONETOUCH VERIO) test strip Use as instructed to check blood sugars 2 times per day dx: e11.65 12/29/19   Glendale Chard, MD  HYDROcodone-acetaminophen (NORCO/VICODIN) 5-325 MG tablet Take 1 tablet by mouth every 6 (six) hours as needed for severe pain. 10/22/20   Arrien, Jimmy Picket, MD  insulin degludec (TRESIBA FLEXTOUCH) 100 UNIT/ML SOPN FlexTouch Pen Inject 0.09 mLs (9 Units total) into the skin daily at 10 pm. 04/26/18   Glendale Chard, MD  losartan (COZAAR) 100 MG tablet Take 100 mg by mouth daily.    [provider]  metoprolol succinate (TOPROL-XL) 25 MG 24 hr tablet 1 tablet by mouth daily at bedtime 08/15/20   Glendale Chard, MD  Multiple Vitamins-Minerals (MULTIVITAMIN WITH MINERALS) tablet Take 1 tablet by mouth daily. Women's 50+    [provider]  ondansetron (ZOFRAN) 4 MG tablet TAKE 1 TABLET BY MOUTH EVERY 8 HOURS AS NEEDED FOR NAUSEA FOR VOMITING Patient taking differently: Take 4 mg by mouth every 8 (eight) hours as needed for nausea or vomiting. 09/28/20   Scheeler, Carola Rhine, PA-C  OneTouch Delica Lancets 99991111 MISC Use as instructed to check blood sugars 2 times per day dx: e11.65 12/29/19   Glendale Chard, MD  OZEMPIC, 1 MG/DOSE, 2 MG/1.5ML SOPN INJECT INTO SKIN ONCE A WEEK Patient taking differently: Inject 1 mg into the skin once a week. 08/11/18   Glendale Chard, MD  polyethylene glycol (MIRALAX / GLYCOLAX) 17 g packet Take 17 g by mouth daily. 10/22/20   Arrien, Jimmy Picket, MD  simvastatin (ZOCOR) 10 MG tablet Take 1 tablet (10 mg total) by mouth daily. 03/08/20   Glendale Chard, MD  SYNTHROID 25 MCG tablet Take 0.5 tablet (12.5 mcg) by mouth on Mondays through Fridays, then take 1 tablet (25  mcg) by mouth on Saturdays and Sundays. 01/27/19   Glendale Chard, MD    Allergies    Meloxicam and Other  Review of Systems   Review of Systems  Constitutional:  Positive for chills. Negative for fever.  HENT:  Negative for ear pain and sore throat.   Eyes:  Negative for pain and visual disturbance.  Respiratory:  Negative for cough and shortness of breath.   Cardiovascular:  Negative for chest pain and palpitations.  Gastrointestinal:  Negative for abdominal pain and vomiting.  Genitourinary:  Negative for dysuria and hematuria.  Musculoskeletal:  Negative for arthralgias and back pain.  Skin:  Negative for color change and rash.  Neurological:  Negative for seizures and syncope.  All other systems reviewed and are negative.  Physical Exam Updated Vital Signs BP (!) 163/59 (BP Location: Left Arm)   Pulse 70   Temp 98.4 F (36.9 C) (Oral)   Resp 16   Ht '5\' 5"'$  (1.651 m)   Wt 97.1 kg   SpO2 98%   BMI 35.62 kg/m   Physical Exam Vitals and nursing note reviewed.  Constitutional:      General: She is not in acute distress.    Appearance: She is well-developed.  HENT:     Head: Normocephalic and atraumatic.  Eyes:     Conjunctiva/sclera: Conjunctivae normal.  Cardiovascular:     Rate and Rhythm: Normal rate and regular rhythm.     Heart sounds: No murmur heard. Pulmonary:     Effort: Pulmonary effort is normal. No respiratory distress.     Breath sounds: Normal breath sounds.  Abdominal:     Palpations: Abdomen is soft.     Tenderness: There is abdominal tenderness.     Comments: Transverse abdominal wound with wound VAC in place, with localized tenderness around the site.  Very limited localized erythema around the wound edges, which appears consistent with normal wound healing.  No erythema, induration, or fluctuance extending around the site.  No purulent discharge appreciated.  Musculoskeletal:     Cervical back: Neck supple.  Skin:    General: Skin is warm and  dry.  Neurological:     Mental  Status: She is alert.    ED Results / Procedures / Treatments   Labs (all labs ordered are listed, but only abnormal results are displayed) Labs Reviewed  CBC WITH DIFFERENTIAL/PLATELET - Abnormal; Notable for the following components:      Result Value   RBC 3.25 (*)    Hemoglobin 9.1 (*)    HCT 28.4 (*)    RDW 16.2 (*)    Monocytes Absolute 1.4 (*)    All other components within normal limits  COMPREHENSIVE METABOLIC PANEL - Abnormal; Notable for the following components:   Glucose, Bld 116 (*)    Creatinine, Ser 1.11 (*)    Total Protein 5.7 (*)    Albumin 2.4 (*)    GFR, Estimated 52 (*)    All other components within normal limits  URINALYSIS, ROUTINE W REFLEX MICROSCOPIC - Abnormal; Notable for the following components:   APPearance CLOUDY (*)    Leukocytes,Ua MODERATE (*)    Bacteria, UA RARE (*)    All other components within normal limits  LACTIC ACID, PLASMA    EKG EKG Interpretation  Date/Time:  Monday October 29 2020 14:58:23 EDT Ventricular Rate:  67 PR Interval:  164 QRS Duration: 114 QT Interval:  406 QTC Calculation: 429 R Axis:   4 Text Interpretation: Sinus rhythm Borderline intraventricular conduction delay No significant change since last tracing Confirmed by Gareth Morgan [phone]) on 10/29/2020 3:51:55 PM  Radiology No results found.  Procedures Procedures   Medications Ordered in ED Medications  sodium chloride 0.9 % bolus 1,000 mL (0 mLs Intravenous Stopped 10/29/20 1734)  sodium chloride 0.9 % bolus 500 mL (0 mLs Intravenous Stopped 10/29/20 1734)    ED Course  I have reviewed the triage vital signs and the nursing notes.  Pertinent labs & imaging results that were available during my care of the patient were reviewed by me and considered in my medical decision making (see chart for details).    MDM Rules/Calculators/A&P                           75 y.o. female with past medical history as above who  presents for evaluation of wound check. Afebrile and hemodynamically stable. Exam as detailed above, with minimal erythema localized to wound edges, which is within expected course of healing. CBC without leukocytosis or left shift. CMP unremarkable. Lactic normal. Doubt surgical site infection. Given intermittent chills without fever, UA was obtained with LE and bacteruria. Patient was placed on a course of Keflex on discharge.   Final Clinical Impression(s) / ED Diagnoses Final diagnoses:  Urinary tract infection without hematuria, site unspecified    Rx / DC Orders ED Discharge Orders          Ordered    cephALEXin (KEFLEX) 250 MG capsule  4 times daily        10/29/20 2314             Violet Baldy, MD 10/31/20 1125    Gareth Morgan, MD 11/01/20 0005

## 2020-10-29 NOTE — ED Triage Notes (Signed)
Pt arrived via [facility] from [place], because the physician from there sent her here due to surgical wound inf. Pt had skin removal surgery done a week ago and the pt has a wound vac on lower abdomen. The area of the lower abdomen is erythematous. VSS

## 2020-10-29 NOTE — ED Notes (Signed)
Urine culture sent down with u/a 

## 2020-10-30 NOTE — ED Notes (Signed)
Report called to [place], RN.

## 2020-11-01 ENCOUNTER — Telehealth: Payer: Self-pay

## 2020-11-01 NOTE — Chronic Care Management (AMB) (Signed)
Chronic Care Management Pharmacy Assistant   Name: Teresa Golden  MRN: [id number] DOB: March 19, 1945  Reason for Encounter: Disease State/ Hypertension  Recent office visits:  10-22-2020 Daneen Schick (CCM)  10-25-2020 Daneen Schick (CCM)  Recent consult visits:  10-12-2020 Scheeler, Carola Rhine, PA-C (Plastic surgery). S/P panniculectomy post-op  10-25-2020 Tanna Furry (Social worker). Follow up  10-26-2020 Wallace Going, DO (Plastic surgery). Follow up on wound infection. Hospital visits:  Medication Reconciliation was completed by comparing discharge summary, patient's EMR and Pharmacy list, and upon discussion with patient.  Admitted to the hospital on 10-12-2020 due to wound infection after surgery. Discharge date was 10-22-2020 . Discharged from [facility]. Irrigation and Debridement wound.  New?Medications Started at [facility] Discharge:?? Amoxicillin 875-125 mg 1 tablet twice daily for 5 days. Tylenol 325 take 2 tablets every 6 hours as needed  Miralax 17 g daily  Doxycycline 100 mg every 12 hours for 5 days  Hydrocodone- acetaminophen 5-325 mg every 6 hours as needed   Medication Changes at Hospital Discharge: None  Medications Discontinued at Hospital Discharge: Aleve  Medications that remain the same after Hospital Discharge:??  -All other medications will remain the same.     Hospital visits:  Medication Reconciliation was completed by comparing discharge summary, patient's EMR and Pharmacy list, and upon discussion with patient.  Admitted to the hospital on 10-29-2020 due to UTI. Discharge date was 10-30-2020 . Discharged from [place]?Medications Started at [facility] Discharge:?? Keflex 250 mg 4 times daily for 5 days  Medication Changes at Hospital Discharge: None  Medications Discontinued at Hospital Discharge: None  Medications that remain the same after Hospital Discharge:??  -All other medications will  remain the same.   Medications: Outpatient Encounter Medications as of 11/01/2020  Medication Sig Note   acetaminophen (TYLENOL) 325 MG tablet Take 2 tablets (650 mg total) by mouth every 6 (six) hours as needed for moderate pain or mild pain.    amLODipine (NORVASC) 5 MG tablet Take 1 tablet by mouth once daily    B Complex-C (B-COMPLEX WITH VITAMIN C) tablet Take 1 tablet by mouth daily.    Biotin 10000 MCG TABS Take 10,000 mcg by mouth daily.    cephALEXin (KEFLEX) 250 MG capsule Take 1 capsule (250 mg total) by mouth 4 (four) times daily for 5 days.    Cholecalciferol 5000 units TABS Take 5,000 Units by mouth daily.    Colchicine 0.6 MG CAPS TAKE 1 CAPSULE DAILY AS    NEEDED (Patient taking differently: Take 0.6 mg by mouth daily as needed (pain).) 06/20/2020: Patient takes as needed   Cranberry-Vitamin C 250-60 MG CAPS Take 1 tablet by mouth daily.    diclofenac Sodium (VOLTAREN) 1 % GEL APPLY 2 GRAMS TO AFFECTED AREA(S) 4 TIMES DAILY AS NEEDED (Patient taking differently: Apply 2 g topically 4 (four) times daily as needed (pain).)    gabapentin (NEURONTIN) 300 MG capsule Take 600 mg by mouth at bedtime.    glucose blood (ONETOUCH VERIO) test strip Use as instructed to check blood sugars 2 times per day dx: e11.65    HYDROcodone-acetaminophen (NORCO/VICODIN) 5-325 MG tablet Take 1 tablet by mouth every 6 (six) hours as needed for severe pain.    insulin degludec (TRESIBA FLEXTOUCH) 100 UNIT/ML SOPN FlexTouch Pen Inject 0.09 mLs (9 Units total) into the skin daily at 10 pm.    losartan (COZAAR) 100 MG tablet Take 100 mg by mouth daily.  metoprolol succinate (TOPROL-XL) 25 MG 24 hr tablet 1 tablet by mouth daily at bedtime    Multiple Vitamins-Minerals (MULTIVITAMIN WITH MINERALS) tablet Take 1 tablet by mouth daily. Women's 50+    ondansetron (ZOFRAN) 4 MG tablet TAKE 1 TABLET BY MOUTH EVERY 8 HOURS AS NEEDED FOR NAUSEA FOR VOMITING (Patient taking differently: Take 4 mg by mouth every 8  (eight) hours as needed for nausea or vomiting.)    OneTouch Delica Lancets 99991111 MISC Use as instructed to check blood sugars 2 times per day dx: e11.65    OZEMPIC, 1 MG/DOSE, 2 MG/1.5ML SOPN INJECT INTO SKIN ONCE A WEEK (Patient taking differently: Inject 1 mg into the skin once a week.)    polyethylene glycol (MIRALAX / GLYCOLAX) 17 g packet Take 17 g by mouth daily.    simvastatin (ZOCOR) 10 MG tablet Take 1 tablet (10 mg total) by mouth daily.    SYNTHROID 25 MCG tablet Take 0.5 tablet (12.5 mcg) by mouth on Mondays through Fridays, then take 1 tablet (25 mcg) by mouth on Saturdays and Sundays.    No facility-administered encounter medications on file as of 11/01/2020.   Reviewed chart prior to disease state call. Spoke with patient regarding BP  Recent Office Vitals: BP Readings from Last 3 Encounters:  10/30/20 (!) 163/59  10/22/20 (!) 155/55  10/03/20 (!) 170/77   Pulse Readings from Last 3 Encounters:  10/30/20 70  10/22/20 (!) 58  10/03/20 70    Wt Readings from Last 3 Encounters:  10/29/20 214 lb 1.1 oz (97.1 kg)  10/15/20 214 lb (97.1 kg)  09/20/20 214 lb 9.6 oz (97.3 kg)     Kidney Function Lab Results  Component Value Date/Time   CREATININE 1.11 (H) 10/29/2020 03:07 PM   CREATININE 1.01 (H) 10/18/2020 12:47 AM   GFRNONAA 52 (L) 10/29/2020 03:07 PM   GFRAA 67 11/29/2019 02:11 PM    BMP Latest Ref Rng & Units 10/29/2020 10/18/2020 10/17/2020  Glucose 70 - 99 mg/dL 116(H) 143(H) 206(H)  BUN 8 - 23 mg/dL 9 20 27(H)  Creatinine 0.44 - 1.00 mg/dL 1.11(H) 1.01(H) 1.23(H)  BUN/Creat Ratio 12 - 28 - - -  Sodium 135 - 145 mmol/L 137 139 138  Potassium 3.5 - 5.1 mmol/L 3.8 4.1 3.9  Chloride 98 - 111 mmol/L 103 109 107  CO2 22 - 32 mmol/L '26 23 23  '$ Calcium 8.9 - 10.3 mg/dL 8.9 8.9 8.7(L)    Current antihypertensive regimen:  Metoprolol Succinate 25 mg daily Losartan 100 mg tablet daily  How often are you checking your Blood Pressure? infrequently  Current home BP  readings: Patient states she hasn't been checking blood pressure at home but rehab checks it. On 10-30-2020 it was 163/59  What recent interventions/DTPs have been made by any provider to improve Blood Pressure control since last CPP Visit:  Daily salt intake goal < 2300 mg Counseled to monitor BP at home at least once per day, document, and provide log at future appointments Exercise goal of 150 minutes per week  Any recent hospitalizations or ED visits since last visit with CPP? Yes  What diet changes have been made to improve Blood Pressure Control?  Patient stated for Breakfast: toast with a tbsp of peanut butter, croissant lunch: salad, ravioli and soup - small portions dinner: salad, ravioli, and soup - small portions snacks: Pop-Secret 100 calories per serving drinks: green [place] diet tea, ocean spray zero calories, propel - black cherry, diet ginger ale.  What  exercise is being done to improve your Blood Pressure Control?  Patient states she goes to rehab during the week  Adherence Review: Is the patient currently on ACE/ARB medication? Yes Does the patient have >5 day gap between last estimated fill dates? No  Care Gaps: Last medicare wellness 09-20-2020  Star Rating Drugs: Losartan 100 mg- Last filled 08-31-2020 [facility] Simvastatin 10 mg- Last filled 08-31-2020 [facility] Ozempic 1 mg- Patient assistance  [place] Pharmacist Assistant [phone]

## 2020-11-02 ENCOUNTER — Telehealth: Payer: Self-pay | Admitting: Plastic Surgery

## 2020-11-02 ENCOUNTER — Inpatient Hospital Stay (HOSPITAL_COMMUNITY)
Admission: EM | Admit: 2020-11-02 | Discharge: 2020-11-07 | DRG: 863 | Disposition: A | Discharge status: Home Health | Payer: Medicare HMO | Attending: Internal Medicine | Admitting: Internal Medicine

## 2020-11-02 ENCOUNTER — Other Ambulatory Visit: Payer: Self-pay

## 2020-11-02 ENCOUNTER — Ambulatory Visit: Payer: Medicare HMO

## 2020-11-02 ENCOUNTER — Encounter (HOSPITAL_COMMUNITY): Payer: Self-pay

## 2020-11-02 DIAGNOSIS — K219 Gastro-esophageal reflux disease without esophagitis: Secondary | ICD-10-CM | POA: Diagnosis present

## 2020-11-02 DIAGNOSIS — N179 Acute kidney failure, unspecified: Secondary | ICD-10-CM | POA: Diagnosis present

## 2020-11-02 DIAGNOSIS — Y838 Other surgical procedures as the cause of abnormal reaction of the patient, or of later complication, without mention of misadventure at the time of the procedure: Secondary | ICD-10-CM | POA: Diagnosis present

## 2020-11-02 DIAGNOSIS — M25571 Pain in right ankle and joints of right foot: Secondary | ICD-10-CM | POA: Diagnosis present

## 2020-11-02 DIAGNOSIS — Z9119 Patient's noncompliance with other medical treatment and regimen: Secondary | ICD-10-CM

## 2020-11-02 DIAGNOSIS — Z9841 Cataract extraction status, right eye: Secondary | ICD-10-CM | POA: Diagnosis not present

## 2020-11-02 DIAGNOSIS — E1165 Type 2 diabetes mellitus with hyperglycemia: Secondary | ICD-10-CM | POA: Diagnosis present

## 2020-11-02 DIAGNOSIS — N1831 Chronic kidney disease, stage 3a: Secondary | ICD-10-CM | POA: Diagnosis present

## 2020-11-02 DIAGNOSIS — E785 Hyperlipidemia, unspecified: Secondary | ICD-10-CM | POA: Diagnosis present

## 2020-11-02 DIAGNOSIS — Z90710 Acquired absence of both cervix and uterus: Secondary | ICD-10-CM | POA: Diagnosis not present

## 2020-11-02 DIAGNOSIS — E039 Hypothyroidism, unspecified: Secondary | ICD-10-CM | POA: Diagnosis present

## 2020-11-02 DIAGNOSIS — Z794 Long term (current) use of insulin: Secondary | ICD-10-CM

## 2020-11-02 DIAGNOSIS — E669 Obesity, unspecified: Secondary | ICD-10-CM | POA: Diagnosis present

## 2020-11-02 DIAGNOSIS — Z9049 Acquired absence of other specified parts of digestive tract: Secondary | ICD-10-CM | POA: Diagnosis not present

## 2020-11-02 DIAGNOSIS — Z8249 Family history of ischemic heart disease and other diseases of the circulatory system: Secondary | ICD-10-CM

## 2020-11-02 DIAGNOSIS — Z888 Allergy status to other drugs, medicaments and biological substances status: Secondary | ICD-10-CM

## 2020-11-02 DIAGNOSIS — Z823 Family history of stroke: Secondary | ICD-10-CM | POA: Diagnosis not present

## 2020-11-02 DIAGNOSIS — M419 Scoliosis, unspecified: Secondary | ICD-10-CM | POA: Diagnosis present

## 2020-11-02 DIAGNOSIS — E1122 Type 2 diabetes mellitus with diabetic chronic kidney disease: Secondary | ICD-10-CM

## 2020-11-02 DIAGNOSIS — Z6832 Body mass index (BMI) 32.0-32.9, adult: Secondary | ICD-10-CM | POA: Diagnosis not present

## 2020-11-02 DIAGNOSIS — N809 Endometriosis, unspecified: Secondary | ICD-10-CM | POA: Diagnosis present

## 2020-11-02 DIAGNOSIS — Z8619 Personal history of other infectious and parasitic diseases: Secondary | ICD-10-CM | POA: Diagnosis not present

## 2020-11-02 DIAGNOSIS — Z79899 Other long term (current) drug therapy: Secondary | ICD-10-CM

## 2020-11-02 DIAGNOSIS — D631 Anemia in chronic kidney disease: Secondary | ICD-10-CM | POA: Diagnosis present

## 2020-11-02 DIAGNOSIS — T8141XA Infection following a procedure, superficial incisional surgical site, initial encounter: Principal | ICD-10-CM | POA: Diagnosis present

## 2020-11-02 DIAGNOSIS — R600 Localized edema: Secondary | ICD-10-CM | POA: Diagnosis present

## 2020-11-02 DIAGNOSIS — I129 Hypertensive chronic kidney disease with stage 1 through stage 4 chronic kidney disease, or unspecified chronic kidney disease: Secondary | ICD-10-CM | POA: Diagnosis present

## 2020-11-02 DIAGNOSIS — T8149XA Infection following a procedure, other surgical site, initial encounter: Secondary | ICD-10-CM | POA: Diagnosis not present

## 2020-11-02 DIAGNOSIS — Z87891 Personal history of nicotine dependence: Secondary | ICD-10-CM

## 2020-11-02 DIAGNOSIS — M109 Gout, unspecified: Secondary | ICD-10-CM | POA: Diagnosis present

## 2020-11-02 DIAGNOSIS — Z9842 Cataract extraction status, left eye: Secondary | ICD-10-CM

## 2020-11-02 DIAGNOSIS — Z20822 Contact with and (suspected) exposure to covid-19: Secondary | ICD-10-CM | POA: Diagnosis present

## 2020-11-02 DIAGNOSIS — K7469 Other cirrhosis of liver: Secondary | ICD-10-CM | POA: Diagnosis present

## 2020-11-02 DIAGNOSIS — Z79890 Hormone replacement therapy (postmenopausal): Secondary | ICD-10-CM

## 2020-11-02 DIAGNOSIS — N183 Chronic kidney disease, stage 3 unspecified: Secondary | ICD-10-CM | POA: Diagnosis present

## 2020-11-02 DIAGNOSIS — N182 Chronic kidney disease, stage 2 (mild): Secondary | ICD-10-CM

## 2020-11-02 DIAGNOSIS — Z96653 Presence of artificial knee joint, bilateral: Secondary | ICD-10-CM | POA: Diagnosis present

## 2020-11-02 DIAGNOSIS — K7460 Unspecified cirrhosis of liver: Secondary | ICD-10-CM | POA: Diagnosis present

## 2020-11-02 DIAGNOSIS — L089 Local infection of the skin and subcutaneous tissue, unspecified: Secondary | ICD-10-CM | POA: Insufficient documentation

## 2020-11-02 LAB — COMPREHENSIVE METABOLIC PANEL
ALT: 11 U/L (ref 0–44)
AST: 17 U/L (ref 15–41)
Albumin: 2.6 g/dL — ABNORMAL LOW (ref 3.5–5.0)
Alkaline Phosphatase: 65 U/L (ref 38–126)
Anion gap: 10 (ref 5–15)
BUN: 18 mg/dL (ref 8–23)
CO2: 26 mmol/L (ref 22–32)
Calcium: 9.1 mg/dL (ref 8.9–10.3)
Chloride: 101 mmol/L (ref 98–111)
Creatinine, Ser: 1.52 mg/dL — ABNORMAL HIGH (ref 0.44–1.00)
GFR, Estimated: 35 mL/min — ABNORMAL LOW (ref >60)
Glucose, Bld: 174 mg/dL — ABNORMAL HIGH (ref 70–99)
Potassium: 3.7 mmol/L (ref 3.5–5.1)
Sodium: 137 mmol/L (ref 135–145)
Total Bilirubin: 0.7 mg/dL (ref 0.3–1.2)
Total Protein: 6.4 g/dL — ABNORMAL LOW (ref 6.5–8.1)

## 2020-11-02 LAB — CBC WITH DIFFERENTIAL/PLATELET
Abs Immature Granulocytes: 0.04 10*3/uL (ref 0.00–0.07)
Basophils Absolute: 0.1 10*3/uL (ref 0.0–0.1)
Basophils Relative: 1 %
Eosinophils Absolute: 0.3 10*3/uL (ref 0.0–0.5)
Eosinophils Relative: 4 %
HCT: 28.5 % — ABNORMAL LOW (ref 36.0–46.0)
Hemoglobin: 9.1 g/dL — ABNORMAL LOW (ref 12.0–15.0)
Immature Granulocytes: 1 %
Lymphocytes Relative: 31 %
Lymphs Abs: 2.2 10*3/uL (ref 0.7–4.0)
MCH: 27.8 pg (ref 26.0–34.0)
MCHC: 31.9 g/dL (ref 30.0–36.0)
MCV: 87.2 fL (ref 80.0–100.0)
Monocytes Absolute: 1.2 10*3/uL — ABNORMAL HIGH (ref 0.1–1.0)
Monocytes Relative: 17 %
Neutro Abs: 3.3 10*3/uL (ref 1.7–7.7)
Neutrophils Relative %: 46 %
Platelets: 244 10*3/uL (ref 150–400)
RBC: 3.27 MIL/uL — ABNORMAL LOW (ref 3.87–5.11)
RDW: 16 % — ABNORMAL HIGH (ref 11.5–15.5)
WBC: 7.2 10*3/uL (ref 4.0–10.5)
nRBC: 0 % (ref 0.0–0.2)

## 2020-11-02 LAB — RESP PANEL BY RT-PCR (FLU A&B, COVID) ARPGX2
Influenza A by PCR: NEGATIVE
Influenza B by PCR: NEGATIVE
SARS Coronavirus 2 by RT PCR: NEGATIVE

## 2020-11-02 LAB — LACTIC ACID, PLASMA: Lactic Acid, Venous: 1.2 mmol/L (ref 0.5–1.9)

## 2020-11-02 MED ORDER — SODIUM CHLORIDE 0.9 % IV SOLN: INTRAVENOUS | Status: DC

## 2020-11-02 MED ORDER — SODIUM CHLORIDE 0.9 % IV SOLN
2.0000 g | Freq: Once | INTRAVENOUS | Status: AC
  Administered 2020-11-02: 2 g via INTRAVENOUS
  Filled 2020-11-02: qty 2

## 2020-11-02 MED ORDER — VANCOMYCIN HCL IN DEXTROSE 1-5 GM/200ML-% IV SOLN
1000.0000 mg | Freq: Once | INTRAVENOUS | Status: AC
  Administered 2020-11-02: 1000 mg via INTRAVENOUS
  Filled 2020-11-02: qty 200

## 2020-11-02 NOTE — Chronic Care Management (AMB) (Signed)
Chronic Care Management    Social Work Note  11/02/2020 Name: Teresa Golden MRN: [id number] DOB: 1946-01-04  Teresa Golden is a 75 y.o. year old female who is a primary care patient of Glendale Chard, MD. The CCM team was consulted to assist the patient with chronic disease management and/or care coordination needs related to:  DM II, CKD III, and HTN .   Engaged with patient by telephone for follow up visit in response to provider referral for social work chronic care management and care coordination services.   Consent to Services:  The patient was given information about Chronic Care Management services, agreed to services, and gave verbal consent prior to initiation of services.  Please see initial visit note for detailed documentation.   Patient agreed to services and consent obtained.   Assessment: Review of patient past medical history, allergies, medications, and health status, including review of relevant consultants reports was performed today as part of a comprehensive evaluation and provision of chronic care management and care coordination services.     SDOH (Social Determinants of Health) assessments and interventions performed:    Advanced Directives Status: Not addressed in this encounter.  CCM Care Plan  Allergies  Allergen Reactions   Meloxicam Other (See Comments)    Fever; muscle aches; "flu-like" symptoms   Other     Rose fever and hay fever     Outpatient Encounter Medications as of 11/02/2020  Medication Sig Note   acetaminophen (TYLENOL) 325 MG tablet Take 2 tablets (650 mg total) by mouth every 6 (six) hours as needed for moderate pain or mild pain.    amLODipine (NORVASC) 5 MG tablet Take 1 tablet by mouth once daily    B Complex-C (B-COMPLEX WITH VITAMIN C) tablet Take 1 tablet by mouth daily.    Biotin 10000 MCG TABS Take 10,000 mcg by mouth daily.    cephALEXin (KEFLEX) 250 MG capsule Take 1 capsule (250 mg total) by mouth 4 (four) times daily for 5  days.    Cholecalciferol 5000 units TABS Take 5,000 Units by mouth daily.    Colchicine 0.6 MG CAPS TAKE 1 CAPSULE DAILY AS    NEEDED (Patient taking differently: Take 0.6 mg by mouth daily as needed (pain).) 06/20/2020: Patient takes as needed   Cranberry-Vitamin C 250-60 MG CAPS Take 1 tablet by mouth daily.    diclofenac Sodium (VOLTAREN) 1 % GEL APPLY 2 GRAMS TO AFFECTED AREA(S) 4 TIMES DAILY AS NEEDED (Patient taking differently: Apply 2 g topically 4 (four) times daily as needed (pain).)    gabapentin (NEURONTIN) 300 MG capsule Take 600 mg by mouth at bedtime.    glucose blood (ONETOUCH VERIO) test strip Use as instructed to check blood sugars 2 times per day dx: e11.65    HYDROcodone-acetaminophen (NORCO/VICODIN) 5-325 MG tablet Take 1 tablet by mouth every 6 (six) hours as needed for severe pain.    insulin degludec (TRESIBA FLEXTOUCH) 100 UNIT/ML SOPN FlexTouch Pen Inject 0.09 mLs (9 Units total) into the skin daily at 10 pm.    losartan (COZAAR) 100 MG tablet Take 100 mg by mouth daily.    metoprolol succinate (TOPROL-XL) 25 MG 24 hr tablet 1 tablet by mouth daily at bedtime    Multiple Vitamins-Minerals (MULTIVITAMIN WITH MINERALS) tablet Take 1 tablet by mouth daily. Women's 50+    ondansetron (ZOFRAN) 4 MG tablet TAKE 1 TABLET BY MOUTH EVERY 8 HOURS AS NEEDED FOR NAUSEA FOR VOMITING (Patient taking differently: Take 4 mg  by mouth every 8 (eight) hours as needed for nausea or vomiting.)    OneTouch Delica Lancets 99991111 MISC Use as instructed to check blood sugars 2 times per day dx: e11.65    OZEMPIC, 1 MG/DOSE, 2 MG/1.5ML SOPN INJECT INTO SKIN ONCE A WEEK (Patient taking differently: Inject 1 mg into the skin once a week.)    polyethylene glycol (MIRALAX / GLYCOLAX) 17 g packet Take 17 g by mouth daily.    simvastatin (ZOCOR) 10 MG tablet Take 1 tablet (10 mg total) by mouth daily.    SYNTHROID 25 MCG tablet Take 0.5 tablet (12.5 mcg) by mouth on Mondays through Fridays, then take 1 tablet  (25 mcg) by mouth on Saturdays and Sundays.    No facility-administered encounter medications on file as of 11/02/2020.    Patient Active Problem List   Diagnosis Date Noted   Pressure injury of skin 10/22/2020   Wound infection after surgery 10/12/2020   Normocytic anemia 10/12/2020   AKI (acute kidney injury) ([place]) 10/12/2020   Obesity (BMI 30-39.9) 10/12/2020   Sepsis ([place]) 10/12/2020   Type 2 diabetes mellitus with stage 3 chronic kidney disease, with long-term current use of insulin ([place]) 11/29/2019   Panniculitis 09/30/2019   Back pain 09/30/2019   Hepatoma ([place]) 01/21/2019   Other cirrhosis of liver ([place]) 07/05/2018   Hypertensive nephropathy 07/05/2018   Chronic renal disease, stage III ([place]) 07/05/2018   Chronic left shoulder pain 07/05/2018   Hypothyroidism 02/06/2018   Hepatitis C virus infection cured after antiviral drug therapy 02/04/2018   Osteoarthritis of right knee 07/30/2017   S/P laparoscopic sleeve gastrectomy July 2018 08/26/2016   Preop cardiovascular exam 07/30/2016   Dyspnea on exertion 07/30/2016   Bilateral lower extremity edema 07/30/2016    Conditions to be addressed/monitored: HTN, DMII, and CKD Stage III  Care Plan : Social Work Care Plan  Updates made by Daneen Schick since 11/02/2020 12:00 AM     Problem: Care Coordination      Long-Range Goal: Collaborate with RN Care Manager to perform appropriate assessments to assist with care coordination needs   Start Date: 03/02/2020  This Visit's Progress: On track  Recent Progress: On track  Priority: Low  Note:   Current Barriers:  Chronic conditions including DM II, CKD III, and HTN which put patient at increased risk of hospitalization Recent hospitalization due to wound infection port panniculectomy  Limited access to a caregiver  Social Work Clinical Goal(s):  patient will work with SW to address concerns related to DM II, CKD III, and HTN and manage care coordination needs Patient will  follow the recommendations of her provider as evidenced by remaining in rehab until appropriate for discharge  Interventions: 1:1 collaboration with Glendale Chard, MD regarding development and update of comprehensive plan of care as evidenced by provider attestation and co-signature Inter-disciplinary care team collaboration (see longitudinal plan of care) Successful outbound call placed to the patient to assess for care coordination needs Discussed the patient remains in rehab and feels she is improving well Determined patient has a follow up with Dr. Marla Roe on 9/20 Scheduled follow up call to the patient on 9/25   Patient Goals/Self-Care Activities patient will:  -Participate in rehab -Follow provider recommendation regarding wound care, therapy needs, and discharge recommendations -Contact SW as needed prior to next scheduled appointment  Follow up Plan: SW will follow up with patient by phone over the next 21 days       Follow Up Plan: SW  will follow up with patient by phone over the next 21 days      Daneen Schick, BSW, CDP Social Worker, Certified Dementia Practitioner [place] [facility] [place] [facility] [phone]

## 2020-11-02 NOTE — Telephone Encounter (Signed)
Per patient- she is currently at [facility] due to a possible infection. She is being transferred to [facility] and can't go back to rehab. Patient expressed that she may be septic and is extremely worried. Patient is requesting for Dr. Marla Roe to call her. Thank you.

## 2020-11-02 NOTE — Patient Instructions (Signed)
Social Worker Visit Information  Goals we discussed today:   Goals Addressed             This Visit's Progress    Work with SW to manage care coordination needs   On track    Timeframe:  Long-Range Goal Priority:  [employer] Start Date:   12.16.21                                           Next date of contact: 9.25.22  Patient Goals/Self-Care Activities patient will:  -Participate in rehab -Follow provider recommendation regarding wound care, therapy needs, and discharge recommendations -Contact SW as needed prior to next scheduled appointment         Follow Up Plan: SW will follow up with patient by phone over the next month   Daneen Schick, BSW, CDP Social Worker, Certified Dementia Practitioner [place] [facility] [place] [facility] [phone]

## 2020-11-02 NOTE — Telephone Encounter (Signed)
Angie, CMA is currently on the phone with Dr. Marla Roe discussing this matter. Please see other 9/9 telephone note.

## 2020-11-02 NOTE — ED Provider Notes (Signed)
[facility] DEPT Provider Note   CSN: [id number] Arrival date & time: 11/02/20  1415     History Chief Complaint  Patient presents with   Wound Check    Teresa Golden is a 75 y.o. female.  75 year old female presents with concern for possible abdominal incisional infection.  States that she had lower abdominal surgery and has had an infection at that scar site.  Has had a wound VAC on this area.  Was seen 4 days ago for similar symptoms.  Was felt to be stable to go home.  States that today at the rehab facility where she resides she was seen by the facility nurse practitioner and sent here for possible wound infection      Past Medical History:  Diagnosis Date   Arthritis    Chronic kidney disease    self reports ckd stage 3    Depression    Diabetes mellitus, type II, insulin dependent ([place])    With neurologic complications. 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    Hypertensive nephropathy 07/05/2018   Hypothyroidism    Hypothyroidism 02/06/2018   Morbid obesity with BMI of 50.0-59.9, adult [facility])    Scoliosis     Patient Active Problem List   Diagnosis Date Noted   Pressure injury of skin 10/22/2020   Wound infection after surgery 10/12/2020   Normocytic anemia 10/12/2020   AKI (acute kidney injury) ([place]) 10/12/2020   Obesity (BMI 30-39.9) 10/12/2020   Sepsis ([place]) 10/12/2020   Type 2 diabetes mellitus with stage 3 chronic kidney disease, with long-term current use of insulin ([place]) 11/29/2019   Panniculitis 09/30/2019   Back pain 09/30/2019   Hepatoma ([place]) 01/21/2019   Other cirrhosis of liver ([place]) 07/05/2018   Hypertensive nephropathy 07/05/2018   Chronic renal disease, stage III ([place])  07/05/2018   Chronic left shoulder pain 07/05/2018   Hypothyroidism 02/06/2018   Hepatitis C virus infection cured after antiviral drug therapy 02/04/2018   Osteoarthritis of right knee 07/30/2017   S/P laparoscopic sleeve gastrectomy July 2018 08/26/2016   Preop cardiovascular exam 07/30/2016   Dyspnea on exertion 07/30/2016   Bilateral lower extremity edema 07/30/2016    Past Surgical History:  Procedure Laterality Date   ABDOMINAL HYSTERECTOMY     uterus   CHOLECYSTECTOMY     DIAGNOSTIC LAPAROSCOPY     DILATION AND CURETTAGE OF UTERUS     EYE SURGERY     cataract extraction bilateral    INCISION AND DRAINAGE OF WOUND N/A 10/15/2020   Procedure: IRRIGATION AND DEBRIDEMENT WOUND;  Surgeon: Wallace Going, DO;  Location: [facility];  Service: Plastics;  Laterality: N/A;  60 min   JOINT REPLACEMENT     Left knee   KNEE ARTHROPLASTY Right 07/30/2017   Procedure: RIGHT TOTAL KNEE ARTHROPLASTY WITH COMPUTER NAVIGATION;  Surgeon: Rod Can, MD;  Location: [facility];  Service: Orthopedics;  Laterality: Right;  Needs RNFA   LAPAROSCOPIC GASTRIC BANDING     and reversal   LAPAROSCOPIC GASTRIC SLEEVE RESECTION N/A 08/26/2016   Procedure: LAPAROSCOPIC GASTRIC SLEEVE RESECTION WITH UPPER ENOD;  Surgeon: Johnathan Hausen, MD;  Location: [facility];  Service: General;  Laterality: N/A;   PANNICULECTOMY N/A 09/26/2020  Procedure: PANNICULECTOMY;  Surgeon: Wallace Going, DO;  Location: [facility];  Service: Plastics;  Laterality: N/A;   TONSILLECTOMY     TRANSTHORACIC ECHOCARDIOGRAM  11/2013   EF 65-70%. Normal diastolic Fxn.  Normal Valves.   UMBILICAL HERNIA REPAIR N/A 09/26/2020   Procedure: HERNIA REPAIR UMBILICAL ADULT;  Surgeon: Georganna Skeans, MD;  Location: [facility];  Service: General;  Laterality: N/A;     OB History   No obstetric history on file.     Family History  Problem Relation Age of Onset   Heart attack Mother    Heart failure Mother    Stroke Father     Social  History   Tobacco Use   Smoking status: Former    Packs/day: 0.25    Years: 10.00    Pack years: 2.50    Types: Cigarettes   Smokeless tobacco: Never   Tobacco comments:    quit 20 years ago  Vaping Use   Vaping Use: Never used  Substance Use Topics   Alcohol use: Not Currently    Alcohol/week: 1.0 standard drink    Types: 1 Glasses of wine per week    Comment: occasional   Drug use: No    Home Medications Prior to Admission medications   Medication Sig Start Date End Date Taking? Authorizing Provider  acetaminophen (TYLENOL) 325 MG tablet Take 2 tablets (650 mg total) by mouth every 6 (six) hours as needed for moderate pain or mild pain. 10/22/20   Arrien, Jimmy Picket, MD  amLODipine (NORVASC) 5 MG tablet Take 1 tablet by mouth once daily Patient taking differently: Take 5 mg by mouth in the morning. 08/21/20   Glendale Chard, MD  B Complex-C (B-COMPLEX WITH VITAMIN C) tablet Take 1 tablet by mouth daily.    [provider]  Biotin 10000 MCG TABS Take 10,000 mcg by mouth daily.    [provider]  cephALEXin (KEFLEX) 250 MG capsule Take 1 capsule (250 mg total) by mouth 4 (four) times daily for 5 days. 10/29/20 11/03/20  Violet Baldy, MD  Cholecalciferol 5000 units TABS Take 5,000 Units by mouth daily.    [provider]  Colchicine 0.6 MG CAPS TAKE 1 CAPSULE DAILY AS    NEEDED Patient taking differently: Take 0.6 mg by mouth daily as needed (pain). 10/26/18   Glendale Chard, MD  Cranberry-Vitamin C 250-60 MG CAPS Take 1 tablet by mouth daily.    [provider]  diclofenac Sodium (VOLTAREN) 1 % GEL APPLY 2 GRAMS TO AFFECTED AREA(S) 4 TIMES DAILY AS NEEDED Patient taking differently: Apply 2 g topically 4 (four) times daily as needed (pain). 02/28/19   Glendale Chard, MD  gabapentin (NEURONTIN) 300 MG capsule Take 600 mg by mouth at bedtime. 08/12/16   [provider]  glucose blood (ONETOUCH VERIO) test strip Use as instructed to check  blood sugars 2 times per day dx: e11.65 12/29/19   Glendale Chard, MD  HYDROcodone-acetaminophen (NORCO/VICODIN) 5-325 MG tablet Take 1 tablet by mouth every 6 (six) hours as needed for severe pain. 10/22/20   Arrien, Jimmy Picket, MD  insulin degludec (TRESIBA FLEXTOUCH) 100 UNIT/ML SOPN FlexTouch Pen Inject 0.09 mLs (9 Units total) into the skin daily at 10 pm. 04/26/18   Glendale Chard, MD  losartan (COZAAR) 100 MG tablet Take 100 mg by mouth daily.    [provider]  metoprolol succinate (TOPROL-XL) 25 MG 24 hr tablet 1 tablet by mouth daily at bedtime 08/15/20   Baird Cancer,  Bailey Mech, MD  Multiple Vitamins-Minerals (MULTIVITAMIN WITH MINERALS) tablet Take 1 tablet by mouth daily. Women's 50+    [provider]  ondansetron (ZOFRAN) 4 MG tablet TAKE 1 TABLET BY MOUTH EVERY 8 HOURS AS NEEDED FOR NAUSEA FOR VOMITING Patient taking differently: Take 4 mg by mouth every 8 (eight) hours as needed for nausea or vomiting. 09/28/20   Scheeler, Carola Rhine, PA-C  OneTouch Delica Lancets 99991111 MISC Use as instructed to check blood sugars 2 times per day dx: e11.65 12/29/19   Glendale Chard, MD  OZEMPIC, 1 MG/DOSE, 2 MG/1.5ML SOPN INJECT INTO SKIN ONCE A WEEK Patient taking differently: Inject 1 mg into the skin once a week. 08/11/18   Glendale Chard, MD  polyethylene glycol (MIRALAX / GLYCOLAX) 17 g packet Take 17 g by mouth daily. 10/22/20   Arrien, Jimmy Picket, MD  simvastatin (ZOCOR) 10 MG tablet Take 1 tablet (10 mg total) by mouth daily. 03/08/20   Glendale Chard, MD  SYNTHROID 25 MCG tablet Take 0.5 tablet (12.5 mcg) by mouth on Mondays through Fridays, then take 1 tablet (25 mcg) by mouth on Saturdays and Sundays. 01/27/19   Glendale Chard, MD    Allergies    Meloxicam and Other  Review of Systems   Review of Systems  All other systems reviewed and are negative.  Physical Exam Updated Vital Signs BP (!) 133/104   Pulse 84   Temp 99 F (37.2 C)   Resp 17   SpO2 97%   Physical  Exam Vitals and nursing note reviewed.  Constitutional:      General: She is not in acute distress.    Appearance: Normal appearance. She is well-developed. She is not toxic-appearing.  HENT:     Head: Normocephalic and atraumatic.  Eyes:     General: Lids are normal.     Conjunctiva/sclera: Conjunctivae normal.     Pupils: Pupils are equal, round, and reactive to light.  Neck:     Thyroid: No thyroid mass.     Trachea: No tracheal deviation.  Cardiovascular:     Rate and Rhythm: Normal rate and regular rhythm.     Heart sounds: Normal heart sounds. No murmur heard.   No gallop.  Pulmonary:     Effort: Pulmonary effort is normal. No respiratory distress.     Breath sounds: Normal breath sounds. No stridor. No decreased breath sounds, wheezing, rhonchi or rales.  Abdominal:     General: There is no distension.     Palpations: Abdomen is soft.     Tenderness: There is no abdominal tenderness. There is no rebound.    Musculoskeletal:        General: No tenderness. Normal range of motion.     Cervical back: Normal range of motion and neck supple.  Skin:    General: Skin is warm and dry.     Findings: No abrasion or rash.  Neurological:     Mental Status: She is alert and oriented to person, place, and time. Mental status is at baseline.     GCS: GCS eye subscore is 4. GCS verbal subscore is 5. GCS motor subscore is 6.     Cranial Nerves: Cranial nerves are intact. No cranial nerve deficit.     Sensory: No sensory deficit.     Motor: Motor function is intact.  Psychiatric:        Attention and Perception: Attention normal.        Speech: Speech normal.  Behavior: Behavior normal.    ED Results / Procedures / Treatments   Labs (all labs ordered are listed, but only abnormal results are displayed) Labs Reviewed  COMPREHENSIVE METABOLIC PANEL - Abnormal; Notable for the following components:      Result Value   Glucose, Bld 174 (*)    Creatinine, Ser 1.52 (*)     Total Protein 6.4 (*)    Albumin 2.6 (*)    GFR, Estimated 35 (*)    All other components within normal limits  CBC WITH DIFFERENTIAL/PLATELET - Abnormal; Notable for the following components:   RBC 3.27 (*)    Hemoglobin 9.1 (*)    HCT 28.5 (*)    RDW 16.0 (*)    Monocytes Absolute 1.2 (*)    All other components within normal limits    EKG None  Radiology No results found.  Procedures Procedures   Medications Ordered in ED Medications - No data to display  ED Course  I have reviewed the triage vital signs and the nursing notes.  Pertinent labs & imaging results that were available during my care of the patient were reviewed by me and considered in my medical decision making (see chart for details).    MDM Rules/Calculators/A&P                           Discussed with plastic surgery team and they recommend hospitalist admission and they will see the patient in consultation tomorrow morning.  We will start patient on vancomycin and consult hospitalist Final Clinical Impression(s) / ED Diagnoses Final diagnoses:  None    Rx / DC Orders ED Discharge Orders     None        Lacretia Leigh, MD 11/02/20 2146

## 2020-11-02 NOTE — ED Notes (Signed)
Spoke with 3rd floor about purple man. Floor to notify nurse of pending patient.

## 2020-11-02 NOTE — ED Notes (Signed)
Dr. Marla Roe paged

## 2020-11-02 NOTE — Telephone Encounter (Signed)
Per Janace Hoard, she spoke with Dr. Marla Roe on the phone and discussed the matter. Dr. Marla Roe stated that she would handle it.

## 2020-11-02 NOTE — H&P (Signed)
History and Physical   Teresa Golden [id number] DOB: 03/27/1945 DOA: 11/02/2020  Referring MD/NP/PA: Dr. Zenia Resides  PCP: Glendale Chard, MD   Outpatient Specialists: Plastic surgery Dr. Marla Roe  Patient coming from: Skilled nursing facility  Chief Complaint: Infected wound  HPI: Teresa Golden is a 75 y.o. female with medical history significant of diabetes, essential hypertension, chronic kidney disease stage III, GERD, hepatitis C, hypothyroidism, morbid obesity, scoliosis, who had panniculectomy performed on August 3 by Dr. Tedra Coupe her.  Postoperatively patient developed infected wound.  She was seen on August 22 and had irrigation and debridement of her wound with wound VAC placed.  She was sent to the skilled facility.  There is a question of noncompliance.  Patient presented today from the facility with complaint of fever and infected wound.  She is afebrile here but the wound looks infected.  Discussion with plastic surgery was done with by the ER.  Recommended admission for IV antibiotics and to be followed by plastic surgery.  Patient therefore being admitted to the hospital for evaluation and treatment..  ED Course: Temperature 99, blood pressure 114/60, pulse 71, respiratory rate of 20 and oxygen sat 92% on room air.  Chemistry largely within normal except glucose 174 and creatinine 1.52.  Albumin is 2.6.  CBC showed hemoglobin 9.1 otherwise the rest are within normal.  Urinalysis showed WBC 6-10 but essentially negative.  Patient being admitted with infected wounds.  Review of Systems: As per HPI otherwise 10 point review of systems negative.    Past Medical History:  Diagnosis Date   Arthritis    Chronic kidney disease    self reports ckd stage 3    Depression    Diabetes mellitus, type II, insulin dependent ([place])    With neurologic complications. Bilateral lower extremity peripheral neuropathy   Dyspnea    with excertion; no issues now since weight loss surgery     Endometriosis    Essential hypertension    GERD (gastroesophageal reflux disease)    Hepatitis C    C dormant; states she is in remission since taking Harvoni    Hypertensive nephropathy 07/05/2018   Hypothyroidism    Hypothyroidism 02/06/2018   Morbid obesity with BMI of 50.0-59.9, adult ([place])    Scoliosis     Past Surgical History:  Procedure Laterality Date   ABDOMINAL HYSTERECTOMY     uterus   CHOLECYSTECTOMY     DIAGNOSTIC LAPAROSCOPY     DILATION AND CURETTAGE OF UTERUS     EYE SURGERY     cataract extraction bilateral    INCISION AND DRAINAGE OF WOUND N/A 10/15/2020   Procedure: IRRIGATION AND DEBRIDEMENT WOUND;  Surgeon: Wallace Going, DO;  Location: [place];  Service: Plastics;  Laterality: N/A;  60 min   JOINT REPLACEMENT     Left knee   KNEE ARTHROPLASTY Right 07/30/2017   Procedure: RIGHT TOTAL KNEE ARTHROPLASTY WITH COMPUTER NAVIGATION;  Surgeon: Rod Can, MD;  Location: [facility];  Service: Orthopedics;  Laterality: Right;  Needs RNFA   LAPAROSCOPIC GASTRIC BANDING     and reversal   LAPAROSCOPIC GASTRIC SLEEVE RESECTION N/A 08/26/2016   Procedure: LAPAROSCOPIC GASTRIC SLEEVE RESECTION WITH UPPER ENOD;  Surgeon: Johnathan Hausen, MD;  Location: [facility];  Service: General;  Laterality: N/A;   PANNICULECTOMY N/A 09/26/2020   Procedure: PANNICULECTOMY;  Surgeon: Wallace Going, DO;  Location: [place];  Service: Plastics;  Laterality: N/A;   TONSILLECTOMY     TRANSTHORACIC ECHOCARDIOGRAM  11/2013   EF 65-70%. Normal diastolic Fxn.  Normal Valves.   UMBILICAL HERNIA REPAIR N/A 09/26/2020   Procedure: HERNIA REPAIR UMBILICAL ADULT;  Surgeon: Georganna Skeans, MD;  Location: [place];  Service: General;  Laterality: N/A;     reports that she has quit smoking. Her smoking use included cigarettes. She has a 2.50 pack-year smoking history. She has never used smokeless tobacco. She reports that she does not currently use alcohol after a past usage of about 1.0 standard  drink per week. She reports that she does not use drugs.  Allergies  Allergen Reactions   Meloxicam Other (See Comments)    Fever; muscle aches; "flu-like" symptoms "Allergic," per [facility]   Other Other (See Comments)    "Rose fever and hay fever"    Family History  Problem Relation Age of Onset   Heart attack Mother    Heart failure Mother    Stroke Father      Prior to Admission medications   Medication Sig Start Date End Date Taking? Authorizing Provider  amLODipine (NORVASC) 5 MG tablet Take 1 tablet by mouth once daily Patient taking differently: Take 5 mg by mouth in the morning. 08/21/20  Yes Glendale Chard, MD  amoxicillin-clavulanate (AUGMENTIN) 875-125 MG tablet Take 1 tablet by mouth 2 (two) times daily.   Yes [provider]  B Complex-C (B-COMPLEX WITH VITAMIN C) tablet Take 1 tablet by mouth daily.   Yes [provider]  Biotin 10000 MCG TABS Take 10,000 mcg by mouth daily.   Yes [provider]  cephALEXin (KEFLEX) 250 MG capsule Take 1 capsule (250 mg total) by mouth 4 (four) times daily for 5 days. 10/29/20 11/03/20 Yes Violet Baldy, MD  Cholecalciferol 5000 units TABS Take 5,000 Units by mouth daily.   Yes [provider]  Cranberry-Vitamin C 250-60 MG CAPS Take 1 capsule by mouth daily.   Yes [provider]  Dulaglutide (TRULICITY) A999333 0000000 SOPN Inject into the skin every Wednesday. 10/24/20  Yes [provider]  gabapentin (NEURONTIN) 300 MG capsule Take 600 mg by mouth at bedtime. 08/12/16  Yes [provider]  losartan (COZAAR) 100 MG tablet Take 100 mg by mouth daily.   Yes [provider]  metoprolol succinate (TOPROL-XL) 25 MG 24 hr tablet 1 tablet by mouth daily at bedtime Patient taking differently: Take 25 mg by mouth at bedtime. 08/15/20  Yes Glendale Chard, MD  Multiple Vitamins-Minerals (MULTIVITAMIN WITH MINERALS) tablet Take 1 tablet by mouth daily. Women's 50+   Yes [provider]  polyethylene glycol (MIRALAX / GLYCOLAX) 17 g packet Take 17 g by mouth daily. 10/22/20  Yes Arrien, Jimmy Picket, MD  SEMGLEE, YFGN, 100 UNIT/ML Pen Inject 9 Units into the skin at bedtime.   Yes [provider]  simvastatin (ZOCOR) 10 MG tablet Take 1 tablet (10 mg total) by mouth daily. Patient taking differently: Take 10 mg by mouth at bedtime. 03/08/20  Yes Glendale Chard, MD  SYNTHROID 112 MCG tablet Take 112 mcg by mouth daily before breakfast.   Yes [provider]  acetaminophen (TYLENOL) 325 MG tablet Take 2 tablets (650 mg total) by mouth every 6 (six) hours as needed for moderate pain or mild pain. 10/22/20   Arrien, Jimmy Picket, MD  Colchicine 0.6 MG CAPS TAKE 1 CAPSULE DAILY AS    NEEDED Patient taking differently: Take 0.6 mg by mouth daily as needed (pain). 10/26/18   Glendale Chard, MD  diclofenac Sodium (VOLTAREN)  1 % GEL APPLY 2 GRAMS TO AFFECTED AREA(S) 4 TIMES DAILY AS NEEDED Patient taking differently: Apply 2 g topically 4 (four) times daily as needed (pain). 02/28/19   Glendale Chard, MD  glucose blood [facility] VERIO) test strip Use as instructed to check blood sugars 2 times per day dx: e11.65 12/29/19   Glendale Chard, MD  HYDROcodone-acetaminophen (NORCO/VICODIN) 5-325 MG tablet Take 1 tablet by mouth every 6 (six) hours as needed for severe pain. 10/22/20   Arrien, Jimmy Picket, MD  insulin degludec (TRESIBA FLEXTOUCH) 100 UNIT/ML SOPN FlexTouch Pen Inject 0.09 mLs (9 Units total) into the skin daily at 10 pm. 04/26/18   Glendale Chard, MD  ondansetron (ZOFRAN) 4 MG tablet TAKE 1 TABLET BY MOUTH EVERY 8 HOURS AS NEEDED FOR NAUSEA FOR VOMITING Patient taking differently: Take 4 mg by mouth every 8 (eight) hours as needed for nausea or vomiting. 09/28/20   Scheeler, Carola Rhine, PA-C  OneTouch Delica Lancets 99991111 MISC Use as instructed to check blood sugars 2 times per day dx: e11.65 12/29/19   Glendale Chard, MD  OZEMPIC, 1 MG/DOSE, 2 MG/1.5ML  SOPN INJECT INTO SKIN ONCE A WEEK Patient taking differently: Inject 1 mg into the skin once a week. 08/11/18   Glendale Chard, MD  SYNTHROID 25 MCG tablet Take 0.5 tablet (12.5 mcg) by mouth on Mondays through Fridays, then take 1 tablet (25 mcg) by mouth on Saturdays and Sundays. Patient not taking: Reported on 11/02/2020 01/27/19   Glendale Chard, MD    Physical Exam: Vitals:   11/02/20 1954 11/02/20 2108 11/02/20 2130 11/02/20 2145  BP: (!) 132/50 (!) 133/104 (!) 130/55 (!) 141/60  Pulse: 73 84 81 77  Resp: 17 17    Temp:  99 F (37.2 C)    TempSrc:      SpO2: 92% 97% 97% 99%      Constitutional: Acutely ill looking no distress Vitals:   11/02/20 1954 11/02/20 2108 11/02/20 2130 11/02/20 2145  BP: (!) 132/50 (!) 133/104 (!) 130/55 (!) 141/60  Pulse: 73 84 81 77  Resp: 17 17    Temp:  99 F (37.2 C)    TempSrc:      SpO2: 92% 97% 97% 99%   Eyes: PERRL, lids and conjunctivae normal ENMT: Mucous membranes are dry posterior pharynx clear of any exudate or lesions.Normal dentition.  Neck: normal, supple, no masses, no thyromegaly Respiratory: clear to auscultation bilaterally, no wheezing, no crackles. Normal respiratory effort. No accessory muscle use.  Cardiovascular: Regular rate and rhythm, no murmurs / rubs / gallops. No extremity edema. 2+ pedal pulses. No carotid bruits.  Abdomen: Open wound noted with foul-smelling discharge, wound dehiscence no tenderness, no masses palpated. No hepatosplenomegaly. Bowel sounds positive.  Musculoskeletal: no clubbing / cyanosis. No joint deformity upper and lower extremities. Good ROM, no contractures. Normal muscle tone.  Skin: no rashes, lesions, ulcers. No induration Neurologic: CN 2-12 grossly intact. Sensation intact, DTR normal. Strength 5/5 in all 4.  Psychiatric: Normal judgment and insight. Alert and oriented x 3. Normal mood.     Labs on Admission: I have personally reviewed following labs and imaging studies  CBC: Recent  Labs  Lab 10/29/20 1507 11/02/20 1524  WBC 8.7 7.2  NEUTROABS 4.5 3.3  HGB 9.1* 9.1*  HCT 28.4* 28.5*  MCV 87.4 87.2  PLT 239 [id number]   Basic Metabolic Panel: Recent Labs  Lab 10/29/20 1507 11/02/20 1524  NA 137 137  K 3.8 3.7  CL 103 101  CO2 26 26  GLUCOSE 116* 174*  BUN 9 18  CREATININE 1.11* 1.52*  CALCIUM 8.9 9.1   GFR: Estimated Creatinine Clearance: 36.3 mL/min (A) (by C-G formula based on SCr of 1.52 mg/dL (H)). Liver Function Tests: Recent Labs  Lab 10/29/20 1507 11/02/20 1524  AST 15 17  ALT 11 11  ALKPHOS 59 65  BILITOT 0.6 0.7  PROT 5.7* 6.4*  ALBUMIN 2.4* 2.6*   No results for input(s): LIPASE, AMYLASE in the last 168 hours. No results for input(s): AMMONIA in the last 168 hours. Coagulation Profile: No results for input(s): INR, PROTIME in the last 168 hours. Cardiac Enzymes: No results for input(s): CKTOTAL, CKMB, CKMBINDEX, TROPONINI in the last 168 hours. BNP (last 3 results) No results for input(s): PROBNP in the last 8760 hours. HbA1C: No results for input(s): HGBA1C in the last 72 hours. CBG: No results for input(s): GLUCAP in the last 168 hours. Lipid Profile: No results for input(s): CHOL, HDL, LDLCALC, TRIG, CHOLHDL, LDLDIRECT in the last 72 hours. Thyroid Function Tests: No results for input(s): TSH, T4TOTAL, FREET4, T3FREE, THYROIDAB in the last 72 hours. Anemia Panel: No results for input(s): VITAMINB12, FOLATE, FERRITIN, TIBC, IRON, RETICCTPCT in the last 72 hours. Urine analysis:    Component Value Date/Time   COLORURINE YELLOW 10/29/2020 2250   APPEARANCEUR CLOUDY (A) 10/29/2020 2250   LABSPEC 1.009 10/29/2020 2250   PHURINE 5.0 10/29/2020 2250   GLUCOSEU NEGATIVE 10/29/2020 2250   HGBUR NEGATIVE 10/29/2020 2250   BILIRUBINUR NEGATIVE 10/29/2020 2250   BILIRUBINUR negative 05/31/2020 1151   KETONESUR NEGATIVE 10/29/2020 2250   PROTEINUR NEGATIVE 10/29/2020 2250   UROBILINOGEN 0.2 05/31/2020 1151   UROBILINOGEN 0.2  10/14/2012 1009   NITRITE NEGATIVE 10/29/2020 2250   LEUKOCYTESUR MODERATE (A) 10/29/2020 2250   Sepsis Labs: '@LABRCNTIP'$ (procalcitonin:4,lacticidven:4) )No results found for this or any previous visit (from the past 240 hour(s)).   Radiological Exams on Admission: No results found.  EKG: Independently reviewed.  Normal sinus rhythm no significant findings.  Assessment/Plan Principal Problem:   Wound infection after surgery Active Problems:   Hepatitis C virus infection cured after antiviral drug therapy   Hypothyroidism   Other cirrhosis of liver (HCC)   Chronic renal disease, stage III (HCC)   Type 2 diabetes mellitus with stage 3 chronic kidney disease, with long-term current use of insulin (HCC)   Obesity (BMI 30-39.9)     #1 infected surgical wound: Patient will be admitted.  Wound care as well as IV vancomycin and cefepime.  Plastic surgery to follow in the morning.  She had a wound VAC.  May require replacement but may also require I&D before then.  #2 diabetes type 2: Sliding scale insulin.  Monitor.  #3 hypothyroidism: TSH seem to be normal.  Continue home regimen once confirmed  #4 chronic kidney disease stage III: Continue to monitor renal function.  Appears to be at baseline.  #5 history of hepatitis C: Appears to have been in remission after treatment.  #6 liver cirrhosis: No decompensation.  Continue to monitor   DVT prophylaxis: Lovenox Code Status: Full Family Communication: No family at bedside Disposition Plan: Back to skilled facility Consults called: Plastic surgery Dr. Marla Roe Admission status: Inpatient  Severity of Illness: The appropriate patient status for this patient is INPATIENT. Inpatient status is judged to be reasonable and necessary in order to provide the required intensity of service to ensure the patient's safety. The patient's presenting symptoms, physical exam findings, and initial radiographic and laboratory  data in the context of  their chronic comorbidities is felt to place them at high risk for further clinical deterioration. Furthermore, it is not anticipated that the patient will be medically stable for discharge from the hospital within 2 midnights of admission. The following factors support the patient status of inpatient.   " The patient's presenting symptoms include infected wound. " The worrisome physical exam findings include wound drainage. " The initial radiographic and laboratory data are worrisome because of hyperglycemia. " The chronic co-morbidities include diabetes.   * I certify that at the point of admission it is my clinical judgment that the patient will require inpatient hospital care spanning beyond 2 midnights from the point of admission due to high intensity of service, high risk for further deterioration and high frequency of surveillance required.Barbette Merino MD [facility] Pager [phone]  If 7PM-7AM, please contact night-coverage [email or web address] Password [email or web address]  11/02/2020, 10:09 PM

## 2020-11-02 NOTE — Telephone Encounter (Signed)
Per [place]- patient was sent to the hospital due to strong odor and discharge from her wound. Patient is requesting that Dr. Marla Roe. Thank you.

## 2020-11-02 NOTE — ED Provider Notes (Signed)
Emergency Medicine Provider Triage Evaluation Note  Teresa Golden , a 75 y.o. female  was evaluated in triage.  Pt complains of chills that began today and concern for "sepsis."  Per chart review patient had elective panniculectomy with plastic surgery by Dr. Tedra Coupe him on 08/03.  She had prolonged hospital stay from 08 19 through 08/29 for wound infection.  She received broad-spectrum antibiotics and surgical debridement with wound VAC and was discharged to a [facility] with prescriptions for doxycycline and Augmentin with completed course on 09/03.  Patient reports that they change her dressing on Mondays and Fridays and changes today and was concerned for worsening infection.  She states she was told she could be "septic" and was told to come to the ED for further evaluation.  Triage report facility states patient is noncompliant with wound VAC and cuts it off because she does not like it.  Denies fevers.  She last saw Dr. Tedra Coupe him on 09/02 with good improvement in her surgical site.  Review of Systems  Positive: + chills, chronic open wound to abdomen Negative: - fevers, erythema  Physical Exam  BP (!) 126/108 (BP Location: Right Arm)   Pulse 86   Temp 98.5 F (36.9 C) (Oral)   Resp 20   SpO2 93%  Gen:   Awake, no distress   Resp:  Normal effort  MSK:   Moves extremities without difficulty  Other:  Open wound to umbilical area with slight erythema and purulent drainage.   Medical Decision Making  Medically screening exam initiated at 3:09 PM.  Appropriate orders placed.  ERICIA BRUGGEMAN was informed that the remainder of the evaluation will be completed by another provider, this initial triage assessment does not replace that evaluation, and the importance of remaining in the ED until their evaluation is complete.  Vital signs are normal.  Patient afebrile nontachycardic and overall well-appearing.  We will plan for lab work.  There is concern the patient is noncompliant with her  wound VAC.  May need to discuss with [facility] about same.  Wound does not look overtly infected today.   Eustaquio Maize, PA-C 11/02/20 1512    Blanchie Dessert, MD 11/05/20 1150

## 2020-11-02 NOTE — ED Triage Notes (Signed)
Per EMS, patient from [facility], c/o infection at surgical site on abdomen x1 week. Facility states non compliant with wound vac, cutting it off because she doesn't like it.   152/74 HR 80 97% RA

## 2020-11-03 LAB — GLUCOSE, CAPILLARY
Glucose-Capillary: 168 mg/dL — ABNORMAL HIGH (ref 70–99)
Glucose-Capillary: 119 mg/dL — ABNORMAL HIGH (ref 70–99)
Glucose-Capillary: 168 mg/dL — ABNORMAL HIGH (ref 70–99)
Glucose-Capillary: 187 mg/dL — ABNORMAL HIGH (ref 70–99)
Glucose-Capillary: 85 mg/dL (ref 70–99)

## 2020-11-03 LAB — CBC
HCT: 25.7 % — ABNORMAL LOW (ref 36.0–46.0)
Hemoglobin: 8.6 g/dL — ABNORMAL LOW (ref 12.0–15.0)
MCH: 28.4 pg (ref 26.0–34.0)
MCHC: 33.5 g/dL (ref 30.0–36.0)
MCV: 84.8 fL (ref 80.0–100.0)
Platelets: 222 10*3/uL (ref 150–400)
RBC: 3.03 MIL/uL — ABNORMAL LOW (ref 3.87–5.11)
RDW: 16.1 % — ABNORMAL HIGH (ref 11.5–15.5)
WBC: 6.5 10*3/uL (ref 4.0–10.5)
nRBC: 0 % (ref 0.0–0.2)

## 2020-11-03 LAB — COMPREHENSIVE METABOLIC PANEL
ALT: 9 U/L (ref 0–44)
AST: 16 U/L (ref 15–41)
Albumin: 2.4 g/dL — ABNORMAL LOW (ref 3.5–5.0)
Alkaline Phosphatase: 62 U/L (ref 38–126)
Anion gap: 9 (ref 5–15)
BUN: 16 mg/dL (ref 8–23)
CO2: 26 mmol/L (ref 22–32)
Calcium: 9.3 mg/dL (ref 8.9–10.3)
Chloride: 105 mmol/L (ref 98–111)
Creatinine, Ser: 1.38 mg/dL — ABNORMAL HIGH (ref 0.44–1.00)
GFR, Estimated: 40 mL/min — ABNORMAL LOW (ref >60)
Glucose, Bld: 192 mg/dL — ABNORMAL HIGH (ref 70–99)
Potassium: 4.2 mmol/L (ref 3.5–5.1)
Sodium: 140 mmol/L (ref 135–145)
Total Bilirubin: 0.5 mg/dL (ref 0.3–1.2)
Total Protein: 6 g/dL — ABNORMAL LOW (ref 6.5–8.1)

## 2020-11-03 MED ORDER — DICLOFENAC SODIUM 1 % EX GEL
2.0000 g | Freq: Four times a day (QID) | CUTANEOUS | Status: DC | PRN
  Filled 2020-11-03: qty 100

## 2020-11-03 MED ORDER — ENOXAPARIN SODIUM 40 MG/0.4ML IJ SOSY
40.0000 mg | PREFILLED_SYRINGE | INTRAMUSCULAR | Status: DC
  Administered 2020-11-03 – 2020-11-05 (×3): 40 mg via SUBCUTANEOUS
  Filled 2020-11-03 (×5): qty 0.4

## 2020-11-03 MED ORDER — SODIUM CHLORIDE 0.9 % IV SOLN: INTRAVENOUS | Status: DC

## 2020-11-03 MED ORDER — INSULIN ASPART 100 UNIT/ML IJ SOLN: 0.0000 [IU] | Freq: Every day | INTRAMUSCULAR | Status: DC

## 2020-11-03 MED ORDER — VANCOMYCIN HCL IN DEXTROSE 1-5 GM/200ML-% IV SOLN
1000.0000 mg | INTRAVENOUS | Status: DC
Start: 2020-11-04 — End: 2020-11-05
  Administered 2020-11-04: 1000 mg via INTRAVENOUS
  Filled 2020-11-03: qty 200

## 2020-11-03 MED ORDER — INSULIN GLARGINE 100 UNIT/ML ~~LOC~~ SOLN
9.0000 [IU] | Freq: Every day | SUBCUTANEOUS | Status: AC
  Administered 2020-11-03 – 2020-11-04 (×2): 9 [IU] via SUBCUTANEOUS
  Filled 2020-11-03 (×2): qty 0.09

## 2020-11-03 MED ORDER — OXYCODONE-ACETAMINOPHEN 5-325 MG PO TABS
1.0000 | ORAL_TABLET | Freq: Four times a day (QID) | ORAL | Status: DC | PRN
  Administered 2020-11-04 – 2020-11-06 (×5): 1 via ORAL
  Filled 2020-11-03 (×5): qty 1

## 2020-11-03 MED ORDER — ONDANSETRON HCL 4 MG/2ML IJ SOLN: 4.0000 mg | Freq: Four times a day (QID) | INTRAMUSCULAR | Status: DC | PRN

## 2020-11-03 MED ORDER — LEVOTHYROXINE SODIUM 112 MCG PO TABS
112.0000 ug | ORAL_TABLET | Freq: Every day | ORAL | Status: DC
  Administered 2020-11-04 – 2020-11-07 (×4): 112 ug via ORAL
  Filled 2020-11-03 (×4): qty 1

## 2020-11-03 MED ORDER — ONDANSETRON HCL 4 MG PO TABS: 4.0000 mg | ORAL_TABLET | Freq: Four times a day (QID) | ORAL | Status: DC | PRN

## 2020-11-03 MED ORDER — METOPROLOL SUCCINATE ER 25 MG PO TB24
25.0000 mg | ORAL_TABLET | Freq: Every day | ORAL | Status: DC
  Administered 2020-11-03 – 2020-11-06 (×4): 25 mg via ORAL
  Filled 2020-11-03 (×4): qty 1

## 2020-11-03 MED ORDER — SODIUM CHLORIDE 0.9 % IV SOLN
2.0000 g | Freq: Two times a day (BID) | INTRAVENOUS | Status: DC
  Administered 2020-11-03 – 2020-11-06 (×7): 2 g via INTRAVENOUS
  Filled 2020-11-03 (×7): qty 2

## 2020-11-03 MED ORDER — COLCHICINE 0.6 MG PO TABS
0.6000 mg | ORAL_TABLET | Freq: Every day | ORAL | Status: DC
  Administered 2020-11-03 – 2020-11-07 (×5): 0.6 mg via ORAL
  Filled 2020-11-03 (×5): qty 1

## 2020-11-03 MED ORDER — GABAPENTIN 300 MG PO CAPS
600.0000 mg | ORAL_CAPSULE | Freq: Every day | ORAL | Status: DC
  Administered 2020-11-03 – 2020-11-06 (×4): 600 mg via ORAL
  Filled 2020-11-03 (×4): qty 2

## 2020-11-03 MED ORDER — MORPHINE SULFATE (PF) 2 MG/ML IV SOLN
2.0000 mg | INTRAVENOUS | Status: DC | PRN
Start: 2020-11-03 — End: 2020-11-03
  Administered 2020-11-03 (×2): 2 mg via INTRAVENOUS
  Filled 2020-11-03 (×2): qty 1

## 2020-11-03 MED ORDER — ACETAMINOPHEN 650 MG RE SUPP: 650.0000 mg | Freq: Four times a day (QID) | RECTAL | Status: DC | PRN

## 2020-11-03 MED ORDER — SIMVASTATIN 20 MG PO TABS
10.0000 mg | ORAL_TABLET | Freq: Every day | ORAL | Status: DC
  Administered 2020-11-03 – 2020-11-06 (×4): 10 mg via ORAL
  Filled 2020-11-03 (×4): qty 1

## 2020-11-03 MED ORDER — AMLODIPINE BESYLATE 5 MG PO TABS
5.0000 mg | ORAL_TABLET | Freq: Every day | ORAL | Status: DC
  Administered 2020-11-03 – 2020-11-07 (×5): 5 mg via ORAL
  Filled 2020-11-03 (×5): qty 1

## 2020-11-03 MED ORDER — INSULIN ASPART 100 UNIT/ML IJ SOLN
0.0000 [IU] | Freq: Three times a day (TID) | INTRAMUSCULAR | Status: DC
  Administered 2020-11-03 – 2020-11-04 (×3): 4 [IU] via SUBCUTANEOUS
  Administered 2020-11-05 (×2): 3 [IU] via SUBCUTANEOUS

## 2020-11-03 MED ORDER — ACETAMINOPHEN 325 MG PO TABS
650.0000 mg | ORAL_TABLET | Freq: Four times a day (QID) | ORAL | Status: DC | PRN
  Administered 2020-11-03 – 2020-11-04 (×2): 650 mg via ORAL
  Filled 2020-11-03 (×2): qty 2

## 2020-11-03 NOTE — Progress Notes (Signed)
Pharmacy Antibiotic Note  Teresa Golden is a 75 y.o. female admitted on 11/02/2020 with c/o infection at surgical site on abdomen x1 week. [name]  Pharmacy has been consulted to dose vancomycin and cefepime for wound infection  Plan: Vancomycin 1gm x 1 then 1gm q36h (AUC 491.9, Scr 1.52) Cefepime 2gm IV q12h Follow renal function and clinical course    Temp (24hrs), Avg:98.8 F (37.1 C), Min:98.5 F (36.9 C), Max:99 F (37.2 C)  Recent Labs  Lab 10/29/20 1507 10/29/20 1625 11/02/20 1524 11/02/20 2200  WBC 8.7  --  7.2  --   CREATININE 1.11*  --  1.52*  --   LATICACIDVEN  --  1.1  --  1.2    Estimated Creatinine Clearance: 36.3 mL/min (A) (by C-G formula based on SCr of 1.52 mg/dL (H)).    Allergies  Allergen Reactions   Meloxicam Other (See Comments)    Fever; muscle aches; "flu-like" symptoms "Allergic," per [facility]   Other Other (See Comments)    "Rose fever and hay fever"    Antimicrobials this admission: 9/9 vanc >> 9/9 cefepime >>  Dose adjustments this admission:   Microbiology results: 9/9 BCx:   Thank you for allowing pharmacy to be a part of this patient's care.  Dolly Rias RPh 11/03/2020, 12:25 AM

## 2020-11-03 NOTE — Progress Notes (Signed)
PROGRESS NOTE  [name]  DOB: November 09, 1945  PCP: Glendale Chard, MD MH:[id number]  DOA: 11/02/2020  LOS: 1 day  Hospital Day: 2   Chief Complaint  Patient presents with   Wound Check    Brief narrative: Teresa Golden is a 75 y.o. female with PMH significant for DM2, HTN, CKD 3, GERD, hep C, hypothyroidism, obesity, scoliosis who had panniculectomy and supraumbilical hernia repair performed on August 3 by plastic surgeon Dr. Marla Roe.   She was seen as an outpatient by plastic surgery on 8/19 for fever, chills, wound infection.  She was hospitalized, underwent irrigation and debridement with wound VAC placement on 8/22.  She was discharged to [facility] skilled nursing facility on 8/29.   On 9/9, patient was sent from the facility with fever, concern of surgical site infection and patient's noncompliance to wound VAC.  In the ED, patient had a temperature of 99, hemodynamically stable Labs with WBC count normal, lactic acid level normal, creatinine elevated 1.52 ED physician discussed the case with plastic surgeon.  Recommended admission for IV antibiotics. Admitted to hospitalist service  Subjective: Patient was seen and examined this morning.  Pleasant elderly African-American female.  Lying down in bed.  Not in distress.  Her primary concern is right ankle pain. For her wound, seen by plastic surgery earlier today. No fever overnight, remains hemodynamically stable  Assessment/Plan: Surgical site infection -Currently IV vancomycin and IV cefepime.  Plastic surgery following. -Reportedly noncompliant wound VAC at the facility.  AKI on CKD stage IIIa -Baseline creatinine 1.1 on 9/5.  Presented with creatinine elevated to 1.52.  Monitor on IV fluids. Recent Labs    10/12/20 1205 10/13/20 0120 10/14/20 0116 10/15/20 0728 10/16/20 0111 10/17/20 0146 10/18/20 0047 10/29/20 1507 11/02/20 1524 11/03/20 0045  BUN 16 17 24* 22 24* 27* '20 9 18 16  '$ CREATININE 1.39* 1.43*  1.54* 1.27* 1.39* 1.23* 1.01* 1.11* 1.52* 1.38*   Acute right ankle pain History of gout -Mild tenderness present on the lateral aspect right malleolus.  No evidence of cellulitis.  Pain slightly worse on active and passive movement of the ankle. -Patient has history of gout and is on as needed colchicine at home.  I would resume colchicine and Neurontin. -Currently also on as needed oxycodone.  I would avoid IV opioids.  Chronic anemia -Hemoglobin level between 8 and 9, at baseline. -Obtain iron panel Recent Labs    05/31/20 1041 09/18/20 0856 10/18/20 0047 10/19/20 0103 10/29/20 1507 11/02/20 1524 11/03/20 0045  HGB 11.4   < > 8.2* 8.6* 9.1* 9.1* 8.6*  MCV 82   < > 85.4 85.9 87.4 87.2 84.8  VITAMINB12 >2000*  --   --   --   --   --   --   FERRITIN 209*  --   --   --   --   --   --   TIBC 302  --   --   --   --   --   --   IRON 94  --   --   --   --   --   --    < > = values in this interval not displayed.   Type 2 diabetes mellitus -A1c 6.4 on 09/18/2020 -Home meds include Semglee 9 units at bedtime, Trulicity [id number] mg every Saturday. -Currently on sliding scale insulin with Accu-Cheks will resume Semglee at 9 units from tonight. -Keep Trulicity on hold. Recent Labs  Lab 11/03/20 0111 11/03/20 0748 11/03/20  [place]   Essential hypertension -Home meds include Toprol 25 mg daily, amlodipine 5 mg daily, losartan 100 mg daily -Resume Toprol and amlodipine this morning.  Keep losartan on hold  Hyperlipidemia -Continue simvastatin  History of hepatitis C, liver cirrhosis -On Toprol.  Not on diuretics or lactulose. -Liver enzymes normal.  Hypothyroidism -Continue Synthroid  Mobility: PT eval ordered Code Status:   Code Status: Full Code  Nutritional status: There is no height or weight on file to calculate BMI.     Diet: Does not want carb controlled diet. Diet Order             Diet regular Room service appropriate? Yes; Fluid consistency:  Thin  Diet effective now                  DVT prophylaxis:  enoxaparin (LOVENOX) injection 40 mg Start: 11/03/20 1000   Antimicrobials: IV cefepime and IV vancomycin Fluid: 100 mL /h Consultants: Plastic surgery Family Communication: None at bedside  Status is: Inpatient  Remains inpatient appropriate because: May need repeat I&D by plastic surgery  Dispo: The patient is from: SNF              Anticipated d/c is to: Does not want to return to SNF.  Wants home with home health at discharge              Patient currently is not medically stable to d/c.   Difficult to place patient No     Infusions:   sodium chloride 20 mL/hr at 11/02/20 2320   sodium chloride 100 mL/hr at 11/03/20 0600   ceFEPime (MAXIPIME) IV 2 g (11/03/20 0933)   [START ON 11/04/2020] vancomycin      Scheduled Meds:  amLODipine  5 mg Oral Daily   enoxaparin (LOVENOX) injection  40 mg Subcutaneous Q24H   insulin aspart  0-20 Units Subcutaneous TID WC   insulin aspart  0-5 Units Subcutaneous QHS   insulin glargine  9 Units Subcutaneous QHS   [START ON 11/04/2020] levothyroxine  112 mcg Oral QAC breakfast   metoprolol succinate  25 mg Oral QHS   simvastatin  10 mg Oral QHS    Antimicrobials: Anti-infectives (From admission, onward)    Start     Dose/Rate Route Frequency Ordered Stop   11/04/20 1000  vancomycin (VANCOCIN) IVPB 1000 mg/200 mL premix        1,000 mg 200 mL/hr over 60 Minutes Intravenous Every 36 hours 11/03/20 0027     11/03/20 1000  ceFEPIme (MAXIPIME) 2 g in sodium chloride 0.9 % 100 mL IVPB        2 g 200 mL/hr over 30 Minutes Intravenous Every 12 hours 11/03/20 0027     11/02/20 2215  ceFEPIme (MAXIPIME) 2 g in sodium chloride 0.9 % 100 mL IVPB        2 g 200 mL/hr over 30 Minutes Intravenous  Once 11/02/20 2213 11/02/20 2349   11/02/20 2200  vancomycin (VANCOCIN) IVPB 1000 mg/200 mL premix        1,000 mg 200 mL/hr over 60 Minutes Intravenous  Once 11/02/20 2147 11/02/20 2312        PRN meds: acetaminophen **OR** acetaminophen, morphine injection, ondansetron **OR** ondansetron (ZOFRAN) IV   Objective: Vitals:   11/03/20 0623 11/03/20 0920  BP: (!) 121/54 (!) 148/61  Pulse: 64 77  Resp:  18  Temp:  98.1 F (36.7 C)  SpO2:  99%  Intake/Output Summary (Last 24 hours) at 11/03/2020 1153 Last data filed at 11/03/2020 1000 Gross per 24 hour  Intake 1019.44 ml  Output 2 ml  Net 1017.44 ml   There were no vitals filed for this visit. Weight change:  There is no height or weight on file to calculate BMI.   Physical Exam: General exam: Pleasant, elderly African-American female. Skin: No rashes, lesions or ulcers. HEENT: Atraumatic, normocephalic, no obvious bleeding Lungs: Clear to auscultation bilaterally CVS: Regular rate and rhythm, no murmur GI/Abd soft, surgical site tenderness.  Bowel sound present CNS: Alert, awake, oriented x3 Psychiatry: Mood appropriate Extremities: Chronic mild pedal edema bilaterally with tenderness on the outer aspect of the right malleolus.  Data Review: I have personally reviewed the laboratory data and studies available.  Recent Labs  Lab 10/29/20 1507 11/02/20 1524 11/03/20 0045  WBC 8.7 7.2 6.5  NEUTROABS 4.5 3.3  --   HGB 9.1* 9.1* 8.6*  HCT 28.4* 28.5* 25.7*  MCV 87.4 87.2 84.8  PLT 239 244 222   Recent Labs  Lab 10/29/20 1507 11/02/20 1524 11/03/20 0045  NA 137 137 140  K 3.8 3.7 4.2  CL 103 101 105  CO2 '26 26 26  '$ GLUCOSE 116* 174* 192*  BUN '9 18 16  '$ CREATININE 1.11* 1.52* 1.38*  CALCIUM 8.9 9.1 9.3    F/u labs ordered Unresulted Labs (From admission, onward)     Start     Ordered   11/09/20 0500  Creatinine, serum  (enoxaparin (LOVENOX)    CrCl >/= 30 ml/min)  Weekly,   R     Comments: while on enoxaparin therapy    11/03/20 0037   11/04/20 0500  Vitamin B12  (Anemia Panel (PNL))  Tomorrow morning,   R        11/03/20 1153   11/04/20 0500  Folate  (Anemia Panel (PNL))  Tomorrow  morning,   R        11/03/20 1153   11/04/20 0500  Iron and TIBC  (Anemia Panel (PNL))  Tomorrow morning,   R        11/03/20 1153   11/04/20 0500  Ferritin  (Anemia Panel (PNL))  Tomorrow morning,   R        11/03/20 1153   11/04/20 0500  Reticulocytes  (Anemia Panel (PNL))  Tomorrow morning,   R        11/03/20 1153   11/04/20 0500  CBC with Differential/Platelet  Daily,   R      11/03/20 1153   11/04/20 [id number]  Basic metabolic panel  Daily,   R      11/03/20 1153   11/02/20 2148  Culture, blood (Routine X 2) w Reflex to ID Panel  BLOOD CULTURE X 2,   R      11/02/20 2147            Signed, Terrilee Croak, MD [facility] 11/03/2020

## 2020-11-03 NOTE — Consult Note (Signed)
Reason for Consult: Wound infection Referring Physician: [name] is an 75 y.o. female with past medical history of HTN, CKD, and recent hospitalization 10/12/2020 to 10/22/2020 for wound infection secondary to panniculectomy performed 09/26/2020.  During admission, she required surgical debridement and placement of wound VAC.  She was transitioned to oral antibiotics and discharged into care of skilled nursing facility.  She tells me that she is never going back there and that they did not treatment well.  She reports that she had water bugs in her room and they would always try to send her to the ER for wound infection even after just being seen in clinic by her surgeon.    On my examination, she actually is denying incisional or abdominal discomfort and instead endorses right lower leg pain.  She states that she did not wear SCDs while in their care.  She is uncertain as to whether or not this feels comparable to her recent gout flare during prior hospitalization.  She denies any fevers, chills, significant abdominal discomfort, redness or swelling, or other symptoms.  HPI:   Past Medical History:  Diagnosis Date   Arthritis    Chronic kidney disease    self reports ckd stage 3    Depression    Diabetes mellitus, type II, insulin dependent ([place])    With neurologic complications. 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    Hypertensive nephropathy 07/05/2018   Hypothyroidism    Hypothyroidism 02/06/2018   Morbid obesity with BMI of 50.0-59.9, adult ([place])    Scoliosis     Past Surgical History:  Procedure Laterality Date   ABDOMINAL HYSTERECTOMY     uterus   CHOLECYSTECTOMY     DIAGNOSTIC LAPAROSCOPY     DILATION AND CURETTAGE  OF UTERUS     EYE SURGERY     cataract extraction bilateral    INCISION AND DRAINAGE OF WOUND N/A 10/15/2020   Procedure: IRRIGATION AND DEBRIDEMENT WOUND;  Surgeon: Wallace Going, DO;  Location: [facility];  Service: Plastics;  Laterality: N/A;  60 min   JOINT REPLACEMENT     Left knee   KNEE ARTHROPLASTY Right 07/30/2017   Procedure: RIGHT TOTAL KNEE ARTHROPLASTY WITH COMPUTER NAVIGATION;  Surgeon: Rod Can, MD;  Location: [facility];  Service: Orthopedics;  Laterality: Right;  Needs RNFA   LAPAROSCOPIC GASTRIC BANDING     and reversal   LAPAROSCOPIC GASTRIC SLEEVE RESECTION N/A 08/26/2016   Procedure: LAPAROSCOPIC GASTRIC SLEEVE RESECTION WITH UPPER ENOD;  Surgeon: Johnathan Hausen, MD;  Location: [facility];  Service: General;  Laterality: N/A;   PANNICULECTOMY N/A 09/26/2020   Procedure: PANNICULECTOMY;  Surgeon: Wallace Going, DO;  Location: [facility];  Service: Plastics;  Laterality: N/A;   TONSILLECTOMY     TRANSTHORACIC ECHOCARDIOGRAM  11/2013   EF 65-70%. Normal diastolic Fxn.  Normal Valves.   UMBILICAL HERNIA REPAIR N/A 09/26/2020   Procedure: HERNIA REPAIR UMBILICAL ADULT;  Surgeon: Georganna Skeans, MD;  Location: [facility];  Service: General;  Laterality: N/A;    Family History  Problem Relation Age of Onset   Heart attack Mother    Heart failure Mother    Stroke Father     Social  History:  reports that she has quit smoking. Her smoking use included cigarettes. She has a 2.50 pack-year smoking history. She has never used smokeless tobacco. She reports that she does not currently use alcohol after a past usage of about 1.0 standard drink per week. She reports that she does not use drugs.  Allergies:  Allergies  Allergen Reactions   Meloxicam Other (See Comments)    Fever; muscle aches; "flu-like" symptoms "Allergic," per MAR   Other Other (See Comments)    "Rose fever and hay fever"    Medications: Prior to Admission:  Medications Prior to Admission  Medication Sig  Dispense Refill Last Dose   acetaminophen (TYLENOL) 325 MG tablet Take 2 tablets (650 mg total) by mouth every 6 (six) hours as needed for moderate pain or mild pain.   11/02/2020 at 1246   amLODipine (NORVASC) 5 MG tablet Take 1 tablet by mouth once daily (Patient taking differently: Take 5 mg by mouth in the morning.) 90 tablet 0 11/02/2020 at 0900   B Complex-C (B-COMPLEX WITH VITAMIN C) tablet Take 1 tablet by mouth daily.   11/02/2020 at 0900   Biotin 10000 MCG TABS Take 10,000 mcg by mouth daily.   11/02/2020 at 0900   cephALEXin (KEFLEX) 250 MG capsule Take 1 capsule (250 mg total) by mouth 4 (four) times daily for 5 days. 20 capsule 0 11/02/2020 at 0900   Cholecalciferol 5000 units TABS Take 5,000 Units by mouth daily.   11/02/2020 at 0900   Colchicine 0.6 MG CAPS TAKE 1 CAPSULE DAILY AS    NEEDED (Patient taking differently: Take 0.6 mg by mouth daily as needed (for inflammation).) 90 capsule 1 unk   Cranberry-Vitamin C 250-60 MG CAPS Take 1 capsule by mouth daily.   11/02/2020 at 0900   diclofenac Sodium (VOLTAREN) 1 % GEL APPLY 2 GRAMS TO AFFECTED AREA(S) 4 TIMES DAILY AS NEEDED (Patient taking differently: Apply 2-4 g topically every 6 (six) hours as needed (to affected areas).) 200 g 2 unk   Dulaglutide (TRULICITY) A999333 0000000 SOPN Inject into the skin every Wednesday.   10/31/2020 at 0800   gabapentin (NEURONTIN) 300 MG capsule Take 600 mg by mouth at bedtime.   11/01/2020 at 2100   HYDROcodone-acetaminophen (NORCO/VICODIN) 5-325 MG tablet Take 1 tablet by mouth every 6 (six) hours as needed for severe pain. 10 tablet 0 11/01/2020 at 0230   losartan (COZAAR) 100 MG tablet Take 100 mg by mouth daily.   11/02/2020 at 0900   metoprolol succinate (TOPROL-XL) 25 MG 24 hr tablet 1 tablet by mouth daily at bedtime (Patient taking differently: Take 25 mg by mouth at bedtime.) 90 tablet 1 11/01/2020 at 2100   Multiple Vitamins-Minerals (MULTIVITAMIN WITH MINERALS) tablet Take 1 tablet by mouth daily. Women's 50+    11/02/2020 at 0900   ondansetron (ZOFRAN) 4 MG tablet TAKE 1 TABLET BY MOUTH EVERY 8 HOURS AS NEEDED FOR NAUSEA FOR VOMITING (Patient taking differently: Take 457 mg by mouth every 8 (eight) hours as needed for nausea or vomiting.) 20 tablet 0 10/30/2020 at 1457   polyethylene glycol (MIRALAX / GLYCOLAX) 17 g packet Take 17 g by mouth daily. 14 each 0 11/02/2020 at 0900   SEMGLEE, YFGN, 100 UNIT/ML Pen Inject 9 Units into the skin at bedtime.   11/01/2020 at 2100   simvastatin (ZOCOR) 10 MG tablet Take 1 tablet (10 mg total) by mouth daily. (Patient taking differently: Take 10 mg by mouth at bedtime.) 90 tablet 1 11/01/2020  at 2100   SYNTHROID 112 MCG tablet Take 112 mcg by mouth daily before breakfast.   11/02/2020 at 0600   glucose blood (ONETOUCH VERIO) test strip Use as instructed to check blood sugars 2 times per day dx: e11.65 300 each 2    insulin degludec (TRESIBA FLEXTOUCH) 100 UNIT/ML SOPN FlexTouch Pen Inject 0.09 mLs (9 Units total) into the skin daily at 10 pm. (Patient not taking: Reported on 11/02/2020) 5 pen 2 Not Taking   OneTouch Delica Lancets 99991111 MISC Use as instructed to check blood sugars 2 times per day dx: e11.65 300 each 2    OZEMPIC, 1 MG/DOSE, 2 MG/1.5ML SOPN INJECT INTO SKIN ONCE A WEEK (Patient not taking: Reported on 11/02/2020) 9 pen 1 Not Taking   SYNTHROID 25 MCG tablet Take 0.5 tablet (12.5 mcg) by mouth on Mondays through Fridays, then take 1 tablet (25 mcg) by mouth on Saturdays and Sundays. (Patient not taking: No sig reported) 90 tablet 1 Not Taking    Results for orders placed or performed during the hospital encounter of 11/02/20 (from the past 48 hour(s))  Comprehensive metabolic panel     Status: Abnormal   Collection Time: 11/02/20  3:24 PM  Result Value Ref Range   Sodium 137 135 - 145 mmol/L   Potassium 3.7 3.5 - 5.1 mmol/L   Chloride 101 98 - 111 mmol/L   CO2 26 22 - 32 mmol/L   Glucose, Bld 174 (H) 70 - 99 mg/dL    Comment: Glucose reference range applies only to  samples taken after fasting for at least 8 hours.   BUN 18 8 - 23 mg/dL   Creatinine, Ser 1.52 (H) 0.44 - 1.00 mg/dL   Calcium 9.1 8.9 - 10.3 mg/dL   Total Protein 6.4 (L) 6.5 - 8.1 g/dL   Albumin 2.6 (L) 3.5 - 5.0 g/dL   AST 17 15 - 41 U/L   ALT 11 0 - 44 U/L   Alkaline Phosphatase 65 38 - 126 U/L   Total Bilirubin 0.7 0.3 - 1.2 mg/dL   GFR, Estimated 35 (L) >60 mL/min    Comment: (NOTE) Calculated using the CKD-EPI Creatinine Equation (2021)    Anion gap 10 5 - 15    Comment: Performed at [facility], [place] [address], [place], [place] [address]  CBC with Differential     Status: Abnormal   Collection Time: 11/02/20  3:24 PM  Result Value Ref Range   WBC 7.2 4.0 - 10.5 K/uL   RBC 3.27 (L) 3.87 - 5.11 MIL/uL   Hemoglobin 9.1 (L) 12.0 - 15.0 g/dL   HCT 28.5 (L) 36.0 - 46.0 %   MCV 87.2 80.0 - 100.0 fL   MCH 27.8 26.0 - 34.0 pg   MCHC 31.9 30.0 - 36.0 g/dL   RDW 16.0 (H) 11.5 - 15.5 %   Platelets 244 150 - 400 K/uL   nRBC 0.0 0.0 - 0.2 %   Neutrophils Relative % 46 %   Neutro Abs 3.3 1.7 - 7.7 K/uL   Lymphocytes Relative 31 %   Lymphs Abs 2.2 0.7 - 4.0 K/uL   Monocytes Relative 17 %   Monocytes Absolute 1.2 (H) 0.1 - 1.0 K/uL   Eosinophils Relative 4 %   Eosinophils Absolute 0.3 0.0 - 0.5 K/uL   Basophils Relative 1 %   Basophils Absolute 0.1 0.0 - 0.1 K/uL   Immature Granulocytes 1 %   Abs Immature Granulocytes 0.04 0.00 - 0.07 K/uL  Comment: Performed at [facility], [place] [address] [place], [place] [address]  Resp Panel by RT-PCR (Flu A&B, Covid) Nasopharyngeal Swab     Status: None   Collection Time: 11/02/20  9:48 PM   Specimen: Nasopharyngeal Swab; Nasopharyngeal(NP) swabs in vial transport medium  Result Value Ref Range   SARS Coronavirus 2 by RT PCR NEGATIVE NEGATIVE    Comment: (NOTE) SARS-CoV-2 target nucleic acids are NOT DETECTED.  The SARS-CoV-2 RNA is generally detectable in upper respiratory specimens during the  acute phase of infection. The lowest concentration of SARS-CoV-2 viral copies this assay can detect is 138 copies/mL. A negative result does not preclude SARS-Cov-2 infection and should not be used as the sole basis for treatment or other patient management decisions. A negative result may occur with  improper specimen collection/handling, submission of specimen other than nasopharyngeal swab, presence of viral mutation(s) within the areas targeted by this assay, and inadequate number of viral copies(<138 copies/mL). A negative result must be combined with clinical observations, patient history, and epidemiological information. The expected result is Negative.  Fact Sheet for Patients:  EntrepreneurPulse.com.au  Fact Sheet for Healthcare Providers:  IncredibleEmployment.be  This test is no t yet approved or cleared by the [place] FDA and  has been authorized for detection and/or diagnosis of SARS-CoV-2 by FDA under an Emergency Use Authorization (EUA). This EUA will remain  in effect (meaning this test can be used) for the duration of the COVID-19 declaration under Section 564(b)(1) of the Act, 21 U.S.C.section 360bbb-3(b)(1), unless the authorization is terminated  or revoked sooner.       Influenza A by PCR NEGATIVE NEGATIVE   Influenza B by PCR NEGATIVE NEGATIVE    Comment: (NOTE) The Xpert Xpress SARS-CoV-2/FLU/RSV plus assay is intended as an aid in the diagnosis of influenza from Nasopharyngeal swab specimens and should not be used as a sole basis for treatment. Nasal washings and aspirates are unacceptable for Xpert Xpress SARS-CoV-2/FLU/RSV testing.  Fact Sheet for Patients: EntrepreneurPulse.com.au  Fact Sheet for Healthcare Providers: IncredibleEmployment.be  This test is not yet approved or cleared by the [place] FDA and has been authorized for detection and/or diagnosis of  SARS-CoV-2 by FDA under an Emergency Use Authorization (EUA). This EUA will remain in effect (meaning this test can be used) for the duration of the COVID-19 declaration under Section 564(b)(1) of the Act, 21 U.S.C. section 360bbb-3(b)(1), unless the authorization is terminated or revoked.  Performed at [facility], [place] [address] [place], [place] [address]   Lactic acid, plasma     Status: None   Collection Time: 11/02/20 10:00 PM  Result Value Ref Range   Lactic Acid, Venous 1.2 0.5 - 1.9 mmol/L    Comment: Performed at [facility], [place] [address] [place],  [address]  Comprehensive metabolic panel     Status: Abnormal   Collection Time: 11/03/20 12:45 AM  Result Value Ref Range   Sodium 140 135 - 145 mmol/L   Potassium 4.2 3.5 - 5.1 mmol/L   Chloride 105 98 - 111 mmol/L   CO2 26 22 - 32 mmol/L   Glucose, Bld 192 (H) 70 - 99 mg/dL    Comment: Glucose reference range applies only to samples taken after fasting for at least 8 hours.   BUN 16 8 - 23 mg/dL   Creatinine, Ser 1.38 (H) 0.44 - 1.00 mg/dL   Calcium 9.3 8.9 - 10.3 mg/dL   Total Protein 6.0 (  L) 6.5 - 8.1 g/dL   Albumin 2.4 (L) 3.5 - 5.0 g/dL   AST 16 15 - 41 U/L   ALT 9 0 - 44 U/L   Alkaline Phosphatase 62 38 - 126 U/L   Total Bilirubin 0.5 0.3 - 1.2 mg/dL   GFR, Estimated 40 (L) >60 mL/min    Comment: (NOTE) Calculated using the CKD-EPI Creatinine Equation (2021)    Anion gap 9 5 - 15    Comment: Performed at [facility], [place] [address] [place], [place] [address]  CBC     Status: Abnormal   Collection Time: 11/03/20 12:45 AM  Result Value Ref Range   WBC 6.5 4.0 - 10.5 K/uL   RBC 3.03 (L) 3.87 - 5.11 MIL/uL   Hemoglobin 8.6 (L) 12.0 - 15.0 g/dL   HCT 25.7 (L) 36.0 - 46.0 %   MCV 84.8 80.0 - 100.0 fL   MCH 28.4 26.0 - 34.0 pg   MCHC 33.5 30.0 - 36.0 g/dL   RDW 16.1 (H) 11.5 - 15.5 %   Platelets 222 150 - 400 K/uL   nRBC 0.0 0.0 - 0.2 %     Comment: Performed at [facility], [place] [address] [place], [place] [address]  Glucose, capillary     Status: Abnormal   Collection Time: 11/03/20  1:11 AM  Result Value Ref Range   Glucose-Capillary 168 (H) 70 - 99 mg/dL    Comment: Glucose reference range applies only to samples taken after fasting for at least 8 hours.  Glucose, capillary     Status: Abnormal   Collection Time: 11/03/20  7:48 AM  Result Value Ref Range   Glucose-Capillary 119 (H) 70 - 99 mg/dL    Comment: Glucose reference range applies only to samples taken after fasting for at least 8 hours.    No results found.  Review of Systems  Constitutional:  Negative for chills and fever.  Cardiovascular:  Positive for leg swelling.  Gastrointestinal:  Negative for abdominal pain and nausea.   Blood pressure (!) 148/61, pulse 77, temperature 98.1 F (36.7 C), temperature source Oral, resp. rate 18, SpO2 99 %. Physical Exam Constitutional:      General: She is not in acute distress.    Appearance: Normal appearance. She is not toxic-appearing.  Cardiovascular:     Rate and Rhythm: Normal rate.     Pulses: Normal pulses.  Pulmonary:     Effort: Pulmonary effort is normal.  Abdominal:     General: There is no distension.     Comments: Abdomen is soft, nondistended.  Mild tenderness along incisions at the umbilicus and abdominal fold.  No significant surrounding erythema or induration.  Incision with opening and drainage bilaterally.  Relatively intact centrally.  No areas of crepitus noted.  Musculoskeletal:     Right lower leg: Edema present.     Left lower leg: Edema present.     Comments: Mild pitting edema bilaterally.  Peripheral pulses intact.  Sensation intact throughout.  Neurological:     Mental Status: She is alert and oriented to person, place, and time.    Assessment/Plan:  Physical exam is largely reassuring on my examination.  Recommend continued wet-to-dry dressings twice daily.   There are no significant cellulitic findings on my exam.  She denies any fevers, chills, or significant abdominal discomfort.  Instead she complains of right lower extremity discomfort for which she is being treated with analgesics.  Will defer patient's right lower  extremity discomfort to primary team.   Requests have already submitted to home health agencies in effort to facilitate wound care at home.  She is adamant about not returning to a skilled nursing facility, but understands that she will require assistance with her changes.  Dr. Marla Roe to evaluate on Monday to determine whether or not she would benefit from additional closure in OR prior to discharge home.     Teresa Golden 11/03/2020, 10:55 AM

## 2020-11-04 LAB — CBC WITH DIFFERENTIAL/PLATELET
Abs Immature Granulocytes: 0.05 10*3/uL (ref 0.00–0.07)
Basophils Absolute: 0.1 10*3/uL (ref 0.0–0.1)
Basophils Relative: 1 %
Eosinophils Absolute: 0.4 10*3/uL (ref 0.0–0.5)
Eosinophils Relative: 7 %
HCT: 23.1 % — ABNORMAL LOW (ref 36.0–46.0)
Hemoglobin: 7.6 g/dL — ABNORMAL LOW (ref 12.0–15.0)
Immature Granulocytes: 1 %
Lymphocytes Relative: 29 %
Lymphs Abs: 1.5 10*3/uL (ref 0.7–4.0)
MCH: 27.9 pg (ref 26.0–34.0)
MCHC: 32.9 g/dL (ref 30.0–36.0)
MCV: 84.9 fL (ref 80.0–100.0)
Monocytes Absolute: 1 10*3/uL (ref 0.1–1.0)
Monocytes Relative: 19 %
Neutro Abs: 2.2 10*3/uL (ref 1.7–7.7)
Neutrophils Relative %: 43 %
Platelets: 202 10*3/uL (ref 150–400)
RBC: 2.72 MIL/uL — ABNORMAL LOW (ref 3.87–5.11)
RDW: 16.2 % — ABNORMAL HIGH (ref 11.5–15.5)
WBC: 5.1 10*3/uL (ref 4.0–10.5)
nRBC: 0 % (ref 0.0–0.2)

## 2020-11-04 LAB — IRON AND TIBC
Iron: 15 ug/dL — ABNORMAL LOW (ref 28–170)
Saturation Ratios: 11 % (ref 10.4–31.8)
TIBC: 134 ug/dL — ABNORMAL LOW (ref 250–450)
UIBC: 119 ug/dL

## 2020-11-04 LAB — BASIC METABOLIC PANEL
Anion gap: 7 (ref 5–15)
BUN: 11 mg/dL (ref 8–23)
CO2: 25 mmol/L (ref 22–32)
Calcium: 8.5 mg/dL — ABNORMAL LOW (ref 8.9–10.3)
Chloride: 106 mmol/L (ref 98–111)
Creatinine, Ser: 1.02 mg/dL — ABNORMAL HIGH (ref 0.44–1.00)
GFR, Estimated: 57 mL/min — ABNORMAL LOW (ref >60)
Glucose, Bld: 105 mg/dL — ABNORMAL HIGH (ref 70–99)
Potassium: 3.9 mmol/L (ref 3.5–5.1)
Sodium: 138 mmol/L (ref 135–145)

## 2020-11-04 LAB — FERRITIN: Ferritin: 214 ng/mL (ref 11–307)

## 2020-11-04 LAB — GLUCOSE, CAPILLARY
Glucose-Capillary: 93 mg/dL (ref 70–99)
Glucose-Capillary: 173 mg/dL — ABNORMAL HIGH (ref 70–99)
Glucose-Capillary: 92 mg/dL (ref 70–99)
Glucose-Capillary: 95 mg/dL (ref 70–99)

## 2020-11-04 LAB — RETICULOCYTES
Immature Retic Fract: 17.8 % — ABNORMAL HIGH (ref 2.3–15.9)
RBC.: 2.67 MIL/uL — ABNORMAL LOW (ref 3.87–5.11)
Retic Count, Absolute: 33.6 10*3/uL (ref 19.0–186.0)
Retic Ct Pct: 1.3 % (ref 0.4–3.1)

## 2020-11-04 LAB — FOLATE: Folate: 18.8 ng/mL (ref >5.9)

## 2020-11-04 LAB — VITAMIN B12: Vitamin B-12: 2361 pg/mL — ABNORMAL HIGH (ref 180–914)

## 2020-11-04 MED ORDER — INSULIN GLARGINE-YFGN 100 UNIT/ML ~~LOC~~ SOLN
9.0000 [IU] | Freq: Every day | SUBCUTANEOUS | Status: DC
  Administered 2020-11-05: 9 [IU] via SUBCUTANEOUS
  Filled 2020-11-04: qty 0.09

## 2020-11-04 NOTE — Evaluation (Signed)
Physical Therapy Evaluation Patient Details Name: Teresa Golden MRN: [id number] DOB: 04/10/1945 Today's Date: 11/04/2020   History of Present Illness  75 y.o. female admitted on 11/02/20 for infected abdominal wound.  Plastic surgery following, IV antibiotics initiated.   Pt with significant PMH of panniculectomy 09/26/20 (Dr. Marla Roe), August 22 I and D and wound vac and was d/c to SNF.  Pt with other significant hx of DM, essential HTN, CKD III, Hep C, hypothyroid, morbid obestiy, scoliosis, gout, L TKA, R knee arthroscopy.  Clinical Impression  Pt from SNF with multiple recent admissions.  She reports she does not want to go back to the SNF she came from, but I do not know that she could manage well at home with only PRN support from "church friends".  She was open to seeing if there is another SNF rehab that she could go to as long as she does not return to where she came form.   PT to follow acutely for deficits listed below.       Follow Up Recommendations SNF (pt agreeable to look at other SNFs, not the one she came from.)    Equipment Recommendations  Other (comment) (would benefit from a new bariatric rollator that has breaks that work.)    Recommendations for Other Services OT consult     Precautions / Restrictions Precautions Precautions: Fall Precaution Comments: R ankle pain/gout      Mobility  Bed Mobility Overal bed mobility: Needs Assistance Bed Mobility: Sit to Supine       Sit to supine: Mod assist   General bed mobility comments: Mod assist to help lift bil legs up into the bed.    Transfers Overall transfer level: Needs assistance Equipment used: 4-wheeled walker Transfers: Sit to/from [facility] Sit to Stand: Min assist         General transfer comment: Min assist to come to standing from recliner chair, I assisted pt to power up and to secure her rollator as the breaks are bad.  Ambulation/Gait Ambulation/Gait assistance: Min  assist Gait Distance (Feet): 5 Feet Assistive device: 4-wheeled walker Gait Pattern/deviations: Step-to pattern;Antalgic Gait velocity: decreased   General Gait Details: Pt with moderately antalgic gait pattern, min assist to steady and help maneuver RW.  Stairs            Wheelchair Mobility    Modified Rankin (Stroke Patients Only)       Balance Overall balance assessment: Needs assistance Sitting-balance support: Feet supported;No upper extremity supported Sitting balance-Leahy Scale: Fair     Standing balance support: Bilateral upper extremity supported Standing balance-Leahy Scale: Poor Standing balance comment: needs support from PT and RW.                             Pertinent Vitals/Pain Pain Assessment: Faces Faces Pain Scale: Hurts even more Pain Location: R ankle primary Pain Descriptors / Indicators: Grimacing;Guarding Pain Intervention(s): Limited activity within patient's tolerance;Monitored during session;Repositioned;Patient requesting pain meds-RN notified;Other (comment) (pt wanted voltaren gel and pain meds, RN notified)    Home Living Family/patient expects to be discharged to:: Private residence Living Arrangements: Alone Available Help at Discharge: Friend(s);Available PRN/intermittently (church friends) Type of Home: House (one story townhome) Home Access: Stairs to enter Entrance Stairs-Rails: None Technical brewer of Steps: 1 Home Layout: One level Home Equipment: Environmental consultant - 4 wheels;Tub bench;Bedside commode      Prior Function Level of Independence: Needs assistance  Gait / Transfers Assistance Needed: Pt from SNF, but reports inconsistent therapy because she keeps having to come to the hospital.  [facility] with rollator at baseline.           Hand Dominance   Dominant Hand: Right    Extremity/Trunk Assessment   Upper Extremity Assessment Upper Extremity Assessment: Defer to OT evaluation    Lower Extremity  Assessment Lower Extremity Assessment: RLE deficits/detail RLE Deficits / Details: right lower leg and ankle sensative to touch.  Pt reporting gout in R ankle, difficulty bearing weight for gait.    Cervical / Trunk Assessment Cervical / Trunk Assessment: Other exceptions Cervical / Trunk Exceptions: increased body habitus, h/o scoliosis  Communication   Communication: No difficulties  Cognition Arousal/Alertness: Awake/alert Behavior During Therapy: WFL for tasks assessed/performed Overall Cognitive Status: No family/caregiver present to determine baseline cognitive functioning                                 General Comments: Conversation normal for me, however, RN tech reports pt confused this AM talking about things unreleated to conversation.      General Comments      Exercises     Assessment/Plan    PT Assessment Patient needs continued PT services  PT Problem List Decreased strength;Decreased activity tolerance;Decreased range of motion;Decreased balance;Decreased mobility;Decreased knowledge of use of DME;Decreased knowledge of precautions;Obesity;Decreased skin integrity;Pain       PT Treatment Interventions DME instruction;Gait training;Functional mobility training;Therapeutic activities;Therapeutic exercise;Balance training;Stair training;Patient/family education;Modalities    PT Goals (Current goals can be found in the Care Plan section)  Acute Rehab PT Goals Patient Stated Goal: would prefer to go home if she can PT Goal Formulation: With patient Time For Goal Achievement: 11/18/20 Potential to Achieve Goals: Good    Frequency Min 3X/week   Barriers to discharge Decreased caregiver support per pt report she has some church friends who can come and "help", but I am unsure of the level of help they could provide (assistance bathing, dressing, toileting, getting up and down and walking?)    Co-evaluation               AM-PAC PT "6  Clicks" Mobility  Outcome Measure Help needed turning from your back to your side while in a flat bed without using bedrails?: A Little Help needed moving from lying on your back to sitting on the side of a flat bed without using bedrails?: A Little Help needed moving to and from a bed to a chair (including a wheelchair)?: A Little Help needed standing up from a chair using your arms (e.g., wheelchair or bedside chair)?: A Little Help needed to walk in hospital room?: A Little Help needed climbing 3-5 steps with a railing? : Total 6 Click Score: 16    End of Session   Activity Tolerance: Patient limited by pain Patient left: in bed;with nursing/sitter in room Nurse Communication: Patient requests pain meds PT Visit Diagnosis: Muscle weakness (generalized) (M62.81);Difficulty in walking, not elsewhere classified (R26.2);Pain Pain - Right/Left: Right Pain - part of body: Ankle and joints of foot    Time: [id number] PT Time Calculation (min) (ACUTE ONLY): 21 min   Charges:   PT Evaluation $PT Eval Moderate Complexity: 1 Mod          Verdene Lennert, PT, DPT  Acute Rehabilitation Ortho Tech Supervisor [phone] pager [phone]) [phone] office

## 2020-11-04 NOTE — Progress Notes (Signed)
PROGRESS NOTE  Teresa Golden  DOB: [birth year]  PCP: Glendale Chard, MD [id number]  DOA: 11/02/2020  LOS: 2 days  Hospital Day: 3   Chief Complaint  Patient presents with   Wound Check    Brief narrative: Teresa Golden is a 75 y.o. female with PMH significant for DM2, HTN, CKD 3, GERD, hep C, hypothyroidism, obesity, scoliosis who had panniculectomy and supraumbilical hernia repair performed on August 3 by plastic surgeon Dr. Marla Roe.   She was seen as an outpatient by plastic surgery on 8/19 for fever, chills, wound infection.  She was hospitalized, underwent irrigation and debridement with wound VAC placement on 8/22.  She was discharged to [facility] facility on 8/29.   On 9/9, patient was sent from the facility with fever, concern of surgical site infection and patient's noncompliance to wound VAC.  In the ED, patient had a temperature of 99, hemodynamically stable Labs with WBC count normal, lactic acid level normal, creatinine elevated 1.52 ED physician discussed the case with plastic surgeon.  Recommended admission for IV antibiotics. Admitted to hospitalist service  Subjective: Patient was seen and examined this morning.  Feels better.  Ankle pain improving.  No complaint of pain in the abdominal wound No fever overnight.  Hemodynamically stable  Assessment/Plan: Surgical site infection -Currently IV vancomycin and IV cefepime.  Plastic surgery following. -Reportedly noncompliant wound VAC at the facility.  AKI on CKD stage IIIa -Baseline creatinine 1.1 on 9/5.  Presented with creatinine elevated to 1.52.  Improved back to baseline with IV fluid.  Can stop IV hydration today.  Encourage oral hydration. Recent Labs    10/13/20 0120 10/14/20 0116 10/15/20 0728 10/16/20 0111 10/17/20 0146 10/18/20 0047 10/29/20 1507 11/02/20 1524 11/03/20 0045 11/04/20 0436  BUN 17 24* 22 24* 27* '20 9 18 16 11  '$ CREATININE 1.43* 1.54* 1.27* 1.39* 1.23* 1.01*  1.11* 1.52* 1.38* 1.02*   Acute right ankle pain History of gout -Patient had mild tenderness present on the lateral aspect right malleolus.  No evidence of cellulitis.  Pain slightly worse on active and passive movement of the ankle. -Patient has history of gout and is on as needed colchicine at home.  Colchicine and Neurontin were resumed.  -Pain improving. Currently also on as needed oxycodone.  I would avoid IV opioids.  Chronic anemia -Hemoglobin level between 8 and 9, at baseline. -Iron panel this morning with ferritin level adequate and stable at 214. Recent Labs    05/31/20 1041 09/18/20 0856 10/19/20 0103 10/29/20 1507 11/02/20 1524 11/03/20 0045 11/04/20 0436  HGB 11.4   < > 8.6* 9.1* 9.1* 8.6* 7.6*  MCV 82   < > 85.9 87.4 87.2 84.8 84.9  VITAMINB12 >2000*  --   --   --   --   --   --   FOLATE  --   --   --   --   --   --  18.8  FERRITIN 209*  --   --   --   --   --  214  TIBC 302  --   --   --   --   --  134*  IRON 94  --   --   --   --   --  15*  RETICCTPCT  --   --   --   --   --   --  1.3   < > = values in this interval not displayed.   Type 2  diabetes mellitus -A1c 6.4 on 09/18/2020 -Home meds include Semglee 9 units at bedtime, Trulicity [id number] mg every Saturday. -Currently on Semglee 9 units nightly along with sliding scale insulin with Accu-Cheks  -Trulicity on hold. Recent Labs  Lab 11/03/20 1142 11/03/20 1623 11/03/20 2027 11/04/20 0734 11/04/20 1135  GLUCAP 168* 187* 85 93 173*   Essential hypertension -Home meds include Toprol 25 mg daily, amlodipine 5 mg daily, losartan 100 mg daily -Continue Toprol and amlodipine.  Losartan remains on hold.  Blood pressure stable.  Hyperlipidemia -Continue simvastatin  History of hepatitis C, liver cirrhosis -On Toprol.  Not on diuretics or lactulose. -Liver enzymes normal.  Hypothyroidism -Continue Synthroid  Mobility: PT eval obtained Code Status:   Code Status: Full Code  Nutritional status: There  is no height or weight on file to calculate BMI.     Diet: Does not want carb controlled diet. Diet Order             Diet regular Room service appropriate? Yes; Fluid consistency: Thin  Diet effective now                  DVT prophylaxis:  enoxaparin (LOVENOX) injection 40 mg Start: 11/03/20 1000   Antimicrobials: IV cefepime and IV vancomycin Fluid: Stop IV fluid today Consultants: Plastic surgery Family Communication: None at bedside  Status is: Inpatient  Remains inpatient appropriate because: May need repeat I&D by plastic surgery  Dispo: The patient is from: SNF              Anticipated d/c is to: Does not want to return back to [facility].  She wants to go home but because of lack of support at home, she seems to be considering another rehab.               Patient currently is not medically stable to d/c.   Difficult to place patient No     Infusions:   sodium chloride 20 mL/hr at 11/02/20 2320   ceFEPime (MAXIPIME) IV 2 g (11/04/20 0959)   vancomycin 1,000 mg (11/04/20 1055)    Scheduled Meds:  amLODipine  5 mg Oral Daily   colchicine  0.6 mg Oral Daily   enoxaparin (LOVENOX) injection  40 mg Subcutaneous Q24H   gabapentin  600 mg Oral QHS   insulin aspart  0-20 Units Subcutaneous TID WC   insulin aspart  0-5 Units Subcutaneous QHS   insulin glargine  9 Units Subcutaneous QHS   levothyroxine  112 mcg Oral QAC breakfast   metoprolol succinate  25 mg Oral QHS   simvastatin  10 mg Oral QHS    Antimicrobials: Anti-infectives (From admission, onward)    Start     Dose/Rate Route Frequency Ordered Stop   11/04/20 1000  vancomycin (VANCOCIN) IVPB 1000 mg/200 mL premix        1,000 mg 200 mL/hr over 60 Minutes Intravenous Every 36 hours 11/03/20 0027     11/03/20 1000  ceFEPIme (MAXIPIME) 2 g in sodium chloride 0.9 % 100 mL IVPB        2 g 200 mL/hr over 30 Minutes Intravenous Every 12 hours 11/03/20 0027     11/02/20 2215  ceFEPIme (MAXIPIME) 2 g in sodium  chloride 0.9 % 100 mL IVPB        2 g 200 mL/hr over 30 Minutes Intravenous  Once 11/02/20 2213 11/02/20 2349   11/02/20 2200  vancomycin (VANCOCIN) IVPB 1000 mg/200 mL premix  1,000 mg 200 mL/hr over 60 Minutes Intravenous  Once 11/02/20 2147 11/02/20 2312       PRN meds: acetaminophen **OR** acetaminophen, diclofenac Sodium, ondansetron **OR** ondansetron (ZOFRAN) IV, oxyCODONE-acetaminophen   Objective: Vitals:   11/03/20 2029 11/04/20 0553  BP: 121/71 (!) 116/57  Pulse: 79 63  Resp: 16 16  Temp: 99.5 F (37.5 C) 98.3 F (36.8 C)  SpO2: 94% 97%    Intake/Output Summary (Last 24 hours) at 11/04/2020 1240 Last data filed at 11/04/2020 1000 Gross per 24 hour  Intake 2204.81 ml  Output 802 ml  Net 1402.81 ml   There were no vitals filed for this visit. Weight change:  There is no height or weight on file to calculate BMI.   Physical Exam: General exam: Pleasant, elderly African-American female. Skin: No rashes, lesions or ulcers. HEENT: Atraumatic, normocephalic, no obvious bleeding Lungs: Clear to auscultation bilaterally  CVS: Regular rate and rhythm, no murmur GI/Abd soft, surgical site tenderness.  Bowel sound present CNS: Alert, awake, oriented x3. Psychiatry: Mood appropriate Extremities: Chronic mild pedal edema bilaterally.  Continues to have mild tenderness on the outer aspect of the right malleolus.  Data Review: I have personally reviewed the laboratory data and studies available.  Recent Labs  Lab 10/29/20 1507 11/02/20 1524 11/03/20 0045 11/04/20 0436  WBC 8.7 7.2 6.5 5.1  NEUTROABS 4.5 3.3  --  2.2  HGB 9.1* 9.1* 8.6* 7.6*  HCT 28.4* 28.5* 25.7* 23.1*  MCV 87.4 87.2 84.8 84.9  PLT 239 244 222 202   Recent Labs  Lab 10/29/20 1507 11/02/20 1524 11/03/20 0045 11/04/20 0436  NA 137 137 140 138  K 3.8 3.7 4.2 3.9  CL 103 101 105 106  CO2 '26 26 26 25  '$ GLUCOSE 116* 174* 192* 105*  BUN '9 18 16 11  '$ CREATININE 1.11* 1.52* 1.38* 1.02*   CALCIUM 8.9 9.1 9.3 8.5*    F/u labs ordered Unresulted Labs (From admission, onward)     Start     Ordered   11/09/20 0500  Creatinine, serum  (enoxaparin (LOVENOX)    CrCl >/= 30 ml/min)  Weekly,   R     Comments: while on enoxaparin therapy    11/03/20 0037   11/04/20 0500  Vitamin B12  (Anemia Panel (PNL))  Tomorrow morning,   R        11/03/20 1153   11/04/20 0500  CBC with Differential/Platelet  Daily,   R      11/03/20 1153   11/04/20 [id number]  Basic metabolic panel  Daily,   R      11/03/20 1153   11/02/20 2148  Culture, blood (Routine X 2) w Reflex to ID Panel  BLOOD CULTURE X 2,   R      11/02/20 2147            Signed, Terrilee Croak, MD [facility] 11/04/2020

## 2020-11-05 ENCOUNTER — Telehealth: Payer: Medicare HMO

## 2020-11-05 ENCOUNTER — Ambulatory Visit: Payer: Self-pay

## 2020-11-05 DIAGNOSIS — Z6836 Body mass index (BMI) 36.0-36.9, adult: Secondary | ICD-10-CM

## 2020-11-05 DIAGNOSIS — N183 Chronic kidney disease, stage 3 unspecified: Secondary | ICD-10-CM

## 2020-11-05 DIAGNOSIS — E1122 Type 2 diabetes mellitus with diabetic chronic kidney disease: Secondary | ICD-10-CM

## 2020-11-05 DIAGNOSIS — Z794 Long term (current) use of insulin: Secondary | ICD-10-CM

## 2020-11-05 DIAGNOSIS — F419 Anxiety disorder, unspecified: Secondary | ICD-10-CM

## 2020-11-05 DIAGNOSIS — I129 Hypertensive chronic kidney disease with stage 1 through stage 4 chronic kidney disease, or unspecified chronic kidney disease: Secondary | ICD-10-CM

## 2020-11-05 DIAGNOSIS — N182 Chronic kidney disease, stage 2 (mild): Secondary | ICD-10-CM

## 2020-11-05 DIAGNOSIS — T8149XA Infection following a procedure, other surgical site, initial encounter: Secondary | ICD-10-CM

## 2020-11-05 LAB — BASIC METABOLIC PANEL
Anion gap: 7 (ref 5–15)
BUN: 10 mg/dL (ref 8–23)
CO2: 25 mmol/L (ref 22–32)
Calcium: 8.9 mg/dL (ref 8.9–10.3)
Chloride: 108 mmol/L (ref 98–111)
Creatinine, Ser: 0.92 mg/dL (ref 0.44–1.00)
GFR, Estimated: >60 mL/min (ref >60)
Glucose, Bld: 95 mg/dL (ref 70–99)
Potassium: 3.7 mmol/L (ref 3.5–5.1)
Sodium: 140 mmol/L (ref 135–145)

## 2020-11-05 LAB — GLUCOSE, CAPILLARY
Glucose-Capillary: 125 mg/dL — ABNORMAL HIGH (ref 70–99)
Glucose-Capillary: 104 mg/dL — ABNORMAL HIGH (ref 70–99)
Glucose-Capillary: 142 mg/dL — ABNORMAL HIGH (ref 70–99)
Glucose-Capillary: 104 mg/dL — ABNORMAL HIGH (ref 70–99)
Glucose-Capillary: 63 mg/dL — ABNORMAL LOW (ref 70–99)
Glucose-Capillary: 101 mg/dL — ABNORMAL HIGH (ref 70–99)

## 2020-11-05 LAB — CBC WITH DIFFERENTIAL/PLATELET
Abs Immature Granulocytes: 0.05 10*3/uL (ref 0.00–0.07)
Basophils Absolute: 0.1 10*3/uL (ref 0.0–0.1)
Basophils Relative: 1 %
Eosinophils Absolute: 0.4 10*3/uL (ref 0.0–0.5)
Eosinophils Relative: 8 %
HCT: 23.4 % — ABNORMAL LOW (ref 36.0–46.0)
Hemoglobin: 7.7 g/dL — ABNORMAL LOW (ref 12.0–15.0)
Immature Granulocytes: 1 %
Lymphocytes Relative: 34 %
Lymphs Abs: 1.7 10*3/uL (ref 0.7–4.0)
MCH: 27.8 pg (ref 26.0–34.0)
MCHC: 32.9 g/dL (ref 30.0–36.0)
MCV: 84.5 fL (ref 80.0–100.0)
Monocytes Absolute: 0.9 10*3/uL (ref 0.1–1.0)
Monocytes Relative: 17 %
Neutro Abs: 2 10*3/uL (ref 1.7–7.7)
Neutrophils Relative %: 39 %
Platelets: 203 10*3/uL (ref 150–400)
RBC: 2.77 MIL/uL — ABNORMAL LOW (ref 3.87–5.11)
RDW: 16 % — ABNORMAL HIGH (ref 11.5–15.5)
WBC: 5.1 10*3/uL (ref 4.0–10.5)
nRBC: 0 % (ref 0.0–0.2)

## 2020-11-05 MED ORDER — VANCOMYCIN HCL IN DEXTROSE 1-5 GM/200ML-% IV SOLN
1000.0000 mg | INTRAVENOUS | Status: DC
  Administered 2020-11-05: 1000 mg via INTRAVENOUS
  Filled 2020-11-05 (×2): qty 200

## 2020-11-05 NOTE — Progress Notes (Signed)
PHARMACY NOTE:  ANTIMICROBIAL RENAL DOSAGE ADJUSTMENT  Current antimicrobial regimen includes a mismatch between antimicrobial dosage and estimated renal function.  As per policy approved by the Pharmacy & Therapeutics and Medical Executive Committees, the antimicrobial dosage will be adjusted accordingly.  Current antimicrobial dosage:  vancomycin 1000 mg iv q 36h  Indication: surgical site infection  Renal Function:  Estimated Creatinine Clearance: 60 mL/min (by C-G formula based on SCr of 0.92 mg/dL). '[]'$      On intermittent HD, scheduled: '[]'$      On CRRT    Antimicrobial dosage has been changed to:  Increase vancomycin dosing to 1000 mg iv q 24 hour to target predicted AUC 403 using SCr 0.92  Additional comments: If SCr continues to improve, will increase cefepime dosing as well  Thank you for allowing pharmacy to be a part of this patient's care.  Napoleon Form, [facility] 11/05/2020 2:30 PM

## 2020-11-05 NOTE — Progress Notes (Signed)
RN called patient into room due to feeling shaky and clammy. Patients blood glucose was taken at [place] and was 63. Patient given grape juice and a popsicle and blood glucose was rechecked at 1915 and was 104.

## 2020-11-05 NOTE — Plan of Care (Signed)
  Problem: Health Behavior/Discharge Planning: Goal: Ability to manage health-related needs will improve Outcome: Progressing   

## 2020-11-05 NOTE — Evaluation (Signed)
Occupational Therapy Evaluation Patient Details Name: Teresa Golden MRN: [id number] DOB: 09-15-1945 Today's Date: 11/05/2020   History of Present Illness 75 y.o. female admitted on 11/02/20 for infected abdominal wound.  Plastic surgery following, IV antibiotics initiated.   Pt with significant PMH of panniculectomy 09/26/20 (Dr. Marla Roe), August 22 I and D and wound vac and was d/c to SNF.  Pt with other significant hx of DM, essential HTN, CKD III, Hep C, hypothyroid, morbid obestiy, scoliosis, gout, L TKA, R knee arthroscopy.   Clinical Impression   Patient is a 75 year old female who was admitted for above. Patient was previously living at home alone. Currently, patient is min A for transfers with increased assistance to manage personal rollator with breaks that are broken.  Patient was noted to have decreased activity tolerance, decreased safety awareness, decreased knowledge of DME use, and decreased endurance impacting patients ability to engage in ADLs. Patient would continue to benefit from skilled OT services at this time while admitted and after d/c to address noted deficits in order to improve overall safety and independence in ADLs.       Recommendations for follow up therapy are one component of a multi-disciplinary discharge planning process, led by the attending physician.  Recommendations may be updated based on patient status, additional functional criteria and insurance authorization.   Follow Up Recommendations  SNF    Equipment Recommendations  Other (comment) (patient needs rollator with fixed breaks. personal one has loose breaks.)    Recommendations for Other Services       Precautions / Restrictions Precautions Precautions: Fall Precaution Comments: R ankle pain/gout, abdominal wounds Restrictions Weight Bearing Restrictions: No      Mobility Bed Mobility Overal bed mobility: Needs Assistance Bed Mobility: Sit to Supine   Sidelying to sit: HOB elevated;Min  guard Supine to sit: Min guard;Min assist;HOB elevated Sit to supine: HOB elevated;Min guard        Transfers Overall transfer level: Needs assistance Equipment used: 4-wheeled walker Transfers: Sit to/from [facility] Sit to Stand: Min guard         General transfer comment: patient needed min guard form bed with min A from recliner 1x then min guard next sit to stand. patient need assist to keep rollator from moving with bilateral breaks not working. SW made aware asked for assistance for patient to fix rollator.    Balance Overall balance assessment: Needs assistance Sitting-balance support: Feet supported;No upper extremity supported Sitting balance-Leahy Scale: Fair     Standing balance support: Single extremity supported Standing balance-Leahy Scale: Fair Standing balance comment: patient was noted to have moments of good standing balance then with fatigue requires more assistance.                           ADL either performed or assessed with clinical judgement   ADL Overall ADL's : Needs assistance/impaired Eating/Feeding: Independent;Sitting   Grooming: Set up;Sitting   Upper Body Bathing: Minimal assistance;Sitting   Lower Body Bathing: Moderate assistance;Sitting/lateral leans Lower Body Bathing Details (indicate cue type and reason): sitting in recliner with resting leg over the edge of seat to wash perineal area while seated. Upper Body Dressing : Set up;Sitting Upper Body Dressing Details (indicate cue type and reason): in recliner Lower Body Dressing: Moderate assistance;Sitting/lateral leans Lower Body Dressing Details (indicate cue type and reason): patient does not want to wear pants or undergarments with wounds right along pants line. Toilet Transfer:  Min Probation officer;Ambulation Toilet Transfer Details (indicate cue type and reason): with rollator. Toileting- Clothing Manipulation and Hygiene: Sitting/lateral  lean;Minimal assistance         General ADL Comments: patient was noted to have decreased activity tolerance affecting her ability to engage in tasks for long periods of time. patient was able to compelte functional mobility with min guard from recliner to bathroom and half way back with patient ntoed to have change in posture with increased trunk flexion and increased speed to make it back to recliner to sit down. patient was educated on safety concerns with rushing when feeling fatigued. patient was noted to be tearful multiple times during session reporting concerns over SNF and strong desire to go back home at time of d/c from hospital. patient was educated on concerns over home alone. patient reported she would reach out to church community. patient was tearful with concerns over being "difficult" and " is that the reason they kicked me out?". patient's thoughts and concerns were heard and validated. patients concerns were passed along to SW on this date.     Vision Patient Visual Report: No change from baseline       Perception     Praxis      Pertinent Vitals/Pain Pain Assessment: 0-10 Pain Score: 5  Pain Location: R ankle primary Pain Descriptors / Indicators: Grimacing;Guarding Pain Intervention(s): Limited activity within patient's tolerance;Monitored during session     Hand Dominance Right   Extremity/Trunk Assessment             Communication Communication Communication: No difficulties   Cognition Arousal/Alertness: Awake/alert Behavior During Therapy: WFL for tasks assessed/performed Overall Cognitive Status: No family/caregiver present to determine baseline cognitive functioning                                 General Comments: patient was educated on therapist plan for session with patient asking to have cards from church to "read" while discussing PLOF with therapist.   General Comments       Exercises     Shoulder Instructions       Home Living Family/patient expects to be discharged to:: Private residence Living Arrangements: Alone Available Help at Discharge: Friend(s);Available PRN/intermittently Type of Home: House Home Access: Stairs to enter [employer] of Steps: 1 Entrance Stairs-Rails: None Home Layout: One level     Bathroom Shower/Tub: Teacher, early years/pre: Standard     Home Equipment: Environmental consultant - 4 wheels;Tub bench;Bedside commode          Prior Functioning/Environment Level of Independence: Needs assistance    ADL's / Homemaking Assistance Needed: patients chart indicated being at SNF. patient reported having been there for one week. patient was previously living at home with MI for ADLs patient reported doing her own cooking and light cleaning, laundry. avid church attender.            OT Problem List: Decreased strength;Decreased activity tolerance;Impaired balance (sitting and/or standing);Decreased safety awareness;Impaired UE functional use;Obesity;Decreased knowledge of use of DME or AE      OT Treatment/Interventions: Self-care/ADL training;Therapeutic exercise;Balance training;Patient/family education;Modalities;Therapeutic activities;DME and/or AE instruction    OT Goals(Current goals can be found in the care plan section) Acute Rehab OT Goals Patient Stated Goal: patient wants to go back home. OT Goal Formulation: With patient Time For Goal Achievement: 11/19/20 Potential to Achieve Goals: Fair  OT Frequency: Min 2X/week   Barriers  to D/C: Decreased caregiver support  patient lives at home alone       Co-evaluation              AM-PAC OT "6 Clicks" Daily Activity     Outcome Measure Help from another person eating meals?: None Help from another person taking care of personal grooming?: A Little Help from another person toileting, which includes using toliet, bedpan, or urinal?: A Little Help from another person bathing (including washing,  rinsing, drying)?: A Little Help from another person to put on and taking off regular upper body clothing?: A Little Help from another person to put on and taking off regular lower body clothing?: A Little 6 Click Score: 19   End of Session Equipment Utilized During Treatment: Gait belt (personal rollator) Nurse Communication: Other (comment) (nurse cleared patient to participate in session.)  Activity Tolerance: Patient tolerated treatment well Patient left: in bed;with call bell/phone within reach  OT Visit Diagnosis: Unsteadiness on feet (R26.81);Other abnormalities of gait and mobility (R26.89);Pain;Muscle weakness (generalized) (M62.81) Pain - Right/Left: Right Pain - part of body: Ankle and joints of foot                Time: [id number] OT Time Calculation (min): 56 min Charges:  OT General Charges $OT Visit: 1 Visit OT Evaluation $OT Eval Moderate Complexity: 1 Mod OT Treatments $Self Care/Home Management : 38-52 mins  Jackelyn Poling OTR/L, MS Acute Rehabilitation Department Office# [phone] Pager# [phone]   [place] 11/05/2020, 1:06 PM

## 2020-11-05 NOTE — Progress Notes (Signed)
PROGRESS NOTE  Teresa Golden  DOB: [birth year]  PCP: Glendale Chard, MD [id number]  DOA: 11/02/2020  LOS: 3 days  Hospital Day: 4   Chief Complaint  Patient presents with   Wound Check    Brief narrative: Teresa Golden is a 75 y.o. female with PMH significant for DM2, HTN, CKD 3, GERD, hep C, hypothyroidism, obesity, scoliosis who had panniculectomy and supraumbilical hernia repair performed on August 3 by plastic surgeon Dr. Marla Roe.   She was seen as an outpatient by plastic surgery on 8/19 for fever, chills, wound infection.  She was hospitalized, underwent irrigation and debridement with wound VAC placement on 8/22.  She was discharged to [place] skilled nursing facility on 8/29.   On 9/9, patient was sent from the facility with fever, concern of surgical site infection and patient's noncompliance to wound VAC.  In the ED, patient had a temperature of 99, hemodynamically stable Labs with WBC count normal, lactic acid level normal, creatinine elevated 1.52 ED physician discussed the case with plastic surgeon.  Recommended admission for IV antibiotics. Admitted to hospitalist service  Subjective: Patient was seen and examined this morning.  Lying on bed.  Not in distress feels better.  Ankle pain improving.  Patient is still is eager to go home but also is considering another rehab. Pending plastic surgery follow-up.  Assessment/Plan: Surgical site infection -Currently IV vancomycin and IV cefepime.  Plastic surgery following. -Currently no systemic symptoms and signs of infection.   -Reportedly noncompliant wound VAC at the facility. Recent Labs  Lab 10/29/20 1507 10/29/20 1625 11/02/20 1524 11/02/20 2200 11/03/20 0045 11/04/20 0436 11/05/20 0350  WBC 8.7  --  7.2  --  6.5 5.1 5.1  LATICACIDVEN  --  1.1  --  1.2  --   --   --    AKI on CKD stage IIIa -Baseline creatinine 1.1 on 9/5.  Presented with creatinine elevated to 1.52.  Improved back to baseline with  IV fluid.  Encourage oral hydration. Recent Labs    10/14/20 0116 10/15/20 0728 10/16/20 0111 10/17/20 0146 10/18/20 0047 10/29/20 1507 11/02/20 1524 11/03/20 0045 11/04/20 0436 11/05/20 0350  BUN 24* 22 24* 27* '20 9 18 16 11 10  '$ CREATININE 1.54* 1.27* 1.39* 1.23* 1.01* 1.11* 1.52* 1.38* 1.02* 0.92    Acute right ankle pain History of gout -On 9/10, patient had mild tenderness present on the lateral aspect right malleolus.  No evidence of cellulitis.  Pain slightly worse on active and passive movement of the ankle. -Patient has history of gout and is on as needed colchicine at home.  Colchicine and Neurontin were resumed.  Pain is improving now.  Continue as needed oxycodone.  Chronic anemia -Hemoglobin level between 8 and 9, at baseline.  Currently running slightly low between 7 and 8.  No evidence of bleeding. -Iron panel this morning with ferritin level adequate and stable at 214. Recent Labs    05/31/20 1041 09/18/20 0856 10/29/20 1507 11/02/20 1524 11/03/20 0045 11/04/20 0436 11/05/20 0350  HGB 11.4   < > 9.1* 9.1* 8.6* 7.6* 7.7*  MCV 82   < > 87.4 87.2 84.8 84.9 84.5  VITAMINB12 >2000*  --   --   --   --  2,361*  --   FOLATE  --   --   --   --   --  18.8  --   FERRITIN 209*  --   --   --   --  214  --  TIBC 302  --   --   --   --  134*  --   IRON 94  --   --   --   --  15*  --   RETICCTPCT  --   --   --   --   --  1.3  --    < > = values in this interval not displayed.    Type 2 diabetes mellitus -A1c 6.4 on 09/18/2020 -Home meds include Semglee 9 units at bedtime, Trulicity [id number] mg every Saturday. -Currently on Semglee 9 units nightly along with sliding scale insulin with Accu-Cheks  -Trulicity on hold.  Blood sugar level acceptable. Recent Labs  Lab 11/04/20 1135 11/04/20 1643 11/04/20 2141 11/05/20 0728 11/05/20 1141  GLUCAP 173* 92 95 125* 104*    Essential hypertension -Home meds include Toprol 25 mg daily, amlodipine 5 mg daily, losartan 100 mg  daily -Continue Toprol and amlodipine.  Losartan remains on hold.  Blood pressure stable.  Continue the same plan.  Hyperlipidemia -Continue simvastatin  History of hepatitis C, liver cirrhosis -On Toprol.  Not on diuretics or lactulose. -Liver enzymes normal.  Hypothyroidism -Continue Synthroid  Mobility: PT eval obtained Code Status:   Code Status: Full Code  Nutritional status: There is no height or weight on file to calculate BMI.     Diet: Does not want carb controlled diet. Diet Order             Diet regular Room service appropriate? Yes; Fluid consistency: Thin  Diet effective now                  DVT prophylaxis:  enoxaparin (LOVENOX) injection 40 mg Start: 11/03/20 1000   Antimicrobials: IV cefepime and IV vancomycin Fluid: Not on IV fluid Consultants: Plastic surgery Family Communication: None at bedside  Status is: Inpatient  Remains inpatient appropriate because: May need repeat I&D by plastic surgery  Dispo: The patient is from: SNF              Anticipated d/c is to: Does not want to return back to [place].  She wants to go home but because of lack of support at home, she seems to be considering another rehab.               Patient currently is not medically stable to d/c.   Difficult to place patient No     Infusions:   sodium chloride 20 mL/hr at 11/02/20 2320   ceFEPime (MAXIPIME) IV 2 g (11/05/20 0816)   vancomycin 1,000 mg (11/04/20 1055)    Scheduled Meds:  amLODipine  5 mg Oral Daily   colchicine  0.6 mg Oral Daily   enoxaparin (LOVENOX) injection  40 mg Subcutaneous Q24H   gabapentin  600 mg Oral QHS   insulin aspart  0-20 Units Subcutaneous TID WC   insulin aspart  0-5 Units Subcutaneous QHS   insulin glargine-yfgn  9 Units Subcutaneous QHS   levothyroxine  112 mcg Oral QAC breakfast   metoprolol succinate  25 mg Oral QHS   simvastatin  10 mg Oral QHS    Antimicrobials: Anti-infectives (From admission, onward)    Start      Dose/Rate Route Frequency Ordered Stop   11/04/20 1000  vancomycin (VANCOCIN) IVPB 1000 mg/200 mL premix        1,000 mg 200 mL/hr over 60 Minutes Intravenous Every 36 hours 11/03/20 0027     11/03/20 1000  ceFEPIme (MAXIPIME) 2 g  in sodium chloride 0.9 % 100 mL IVPB        2 g 200 mL/hr over 30 Minutes Intravenous Every 12 hours 11/03/20 0027     11/02/20 2215  ceFEPIme (MAXIPIME) 2 g in sodium chloride 0.9 % 100 mL IVPB        2 g 200 mL/hr over 30 Minutes Intravenous  Once 11/02/20 2213 11/02/20 2349   11/02/20 2200  vancomycin (VANCOCIN) IVPB 1000 mg/200 mL premix        1,000 mg 200 mL/hr over 60 Minutes Intravenous  Once 11/02/20 2147 11/02/20 2312       PRN meds: acetaminophen **OR** acetaminophen, diclofenac Sodium, ondansetron **OR** ondansetron (ZOFRAN) IV, oxyCODONE-acetaminophen   Objective: Vitals:   11/04/20 2144 11/05/20 0522  BP: 116/67 (!) 143/58  Pulse: 78 62  Resp: 16 18  Temp: 98.8 F (37.1 C) 98.7 F (37.1 C)  SpO2: 97% 98%    Intake/Output Summary (Last 24 hours) at 11/05/2020 1146 Last data filed at 11/05/2020 1000 Gross per 24 hour  Intake 2060 ml  Output 4 ml  Net 2056 ml    There were no vitals filed for this visit. Weight change:  There is no height or weight on file to calculate BMI.   Physical Exam: General exam: Pleasant, elderly African-American female.  Not in physical distress Skin: No rashes, lesions or ulcers. HEENT: Atraumatic, normocephalic, no obvious bleeding Lungs: Clear to auscultation bilaterally  CVS: Regular rate and rhythm, no murmur GI/Abd soft, surgical site tenderness.  Bowel sound present CNS: Alert, awake, oriented x3. Psychiatry: Mood appropriate Extremities: Chronic mild pedal edema bilaterally.  Continues to have mild tenderness on the outer aspect of the right malleolus.  Data Review: I have personally reviewed the laboratory data and studies available.  Recent Labs  Lab 10/29/20 1507 11/02/20 1524  11/03/20 0045 11/04/20 0436 11/05/20 0350  WBC 8.7 7.2 6.5 5.1 5.1  NEUTROABS 4.5 3.3  --  2.2 2.0  HGB 9.1* 9.1* 8.6* 7.6* 7.7*  HCT 28.4* 28.5* 25.7* 23.1* 23.4*  MCV 87.4 87.2 84.8 84.9 84.5  PLT 239 244 222 202 203    Recent Labs  Lab 10/29/20 1507 11/02/20 1524 11/03/20 0045 11/04/20 0436 11/05/20 0350  NA 137 137 140 138 140  K 3.8 3.7 4.2 3.9 3.7  CL 103 101 105 106 108  CO2 '26 26 26 25 25  '$ GLUCOSE 116* 174* 192* 105* 95  BUN '9 18 16 11 10  '$ CREATININE 1.11* 1.52* 1.38* 1.02* 0.92  CALCIUM 8.9 9.1 9.3 8.5* 8.9     F/u labs ordered Unresulted Labs (From admission, onward)     Start     Ordered   11/09/20 0500  Creatinine, serum  (enoxaparin (LOVENOX)    CrCl >/= 30 ml/min)  Weekly,   R     Comments: while on enoxaparin therapy    11/03/20 0037   11/04/20 0500  CBC with Differential/Platelet  Daily,   R      11/03/20 1153   11/04/20 [id number]  Basic metabolic panel  Daily,   R      11/03/20 1153            Signed, Terrilee Croak, MD [facility] 11/05/2020

## 2020-11-05 NOTE — TOC Initial Note (Signed)
Transition of Care [facility]) - Initial/Assessment Note   Patient Details  Name: Teresa Golden MRN: [id number] Date of Birth: [birth year]  [facility] CM/SW Contact:    Sherie Don, LCSW Phone Number: 11/05/2020, 1:52 PM  Clinical Narrative: TOC received consult regarding the patient not wanting to return to her SNF ([facility]). CSW spoke with patient to discuss SNF options as patient has [employer] and there are limited facilities in-network. Patient is aware that most of the facilities in-network are rated 1-2 stars on the Medicare website. CSW discussed with patient the possibility of discharging home with [facility] and a wound vac. Patient reported she is agreeable if that is what plastic surgery recommends. TOC to follow.  Expected Discharge Plan: [place] Barriers to Discharge: Continued Medical Work up  Patient Goals and CMS Choice Patient states their goals for this hospitalization and ongoing recovery are:: Return home with [facility] and wound vac CMS Medicare.gov Compare Post Acute Care list provided to:: Patient Choice offered to / list presented to : Patient  Expected Discharge Plan and Services Expected Discharge Plan: [place] In-house Referral: Clinical Social Work Living arrangements for the past 2 months: [place]  Prior Living Arrangements/Services Living arrangements for the past 2 months: [place] Need for Family Participation in Patient Care: Yes (Comment) Care giver support system in place?: Yes (comment) Criminal Activity/Legal Involvement Pertinent to Current Situation/Hospitalization: No - Comment as needed  Activities of Daily Living Home Assistive Devices/Equipment: Cane (specify quad or straight), Walker (specify type) ADL Screening (condition at time of admission) Patient's cognitive ability adequate to safely complete daily activities?: Yes Is the patient deaf or have difficulty  hearing?: No Does the patient have difficulty seeing, even when wearing glasses/contacts?: No Does the patient have difficulty concentrating, remembering, or making decisions?: No Patient able to express need for assistance with ADLs?: Yes Does the patient have difficulty dressing or bathing?: Yes Independently performs ADLs?: No Does the patient have difficulty walking or climbing stairs?: Yes Weakness of Legs: Both Weakness of Arms/Hands: None  Permission Sought/Granted Permission sought to share information with : Other (comment) Permission granted to share info w AGENCY: [place] agencies  Emotional Assessment Attitude/Demeanor/Rapport: Engaged Affect (typically observed): Accepting Orientation: : Oriented to Self, Oriented to Place, Oriented to  Time, Oriented to Situation Alcohol / Substance Use: Not Applicable Psych Involvement: No (comment)  Admission diagnosis:  Infected wound [T14.8XXA, L08.9] Patient Active Problem List   Diagnosis Date Noted   Infected wound 11/02/2020   Pressure injury of skin 10/22/2020   Wound infection after surgery 10/12/2020   Normocytic anemia 10/12/2020   AKI (acute kidney injury) ([place]) 10/12/2020   Obesity (BMI 30-39.9) 10/12/2020   Sepsis ([place]) 10/12/2020   Type 2 diabetes mellitus with stage 3 chronic kidney disease, with long-term current use of insulin ([place]) 11/29/2019   Panniculitis 09/30/2019   Back pain 09/30/2019   Hepatoma ([place]) 01/21/2019   Other cirrhosis of liver ([place]) 07/05/2018   Hypertensive nephropathy 07/05/2018   Chronic renal disease, stage III ([place]) 07/05/2018   Chronic left shoulder pain 07/05/2018   Hypothyroidism 02/06/2018   Hepatitis C virus infection cured after antiviral drug therapy 02/04/2018   Osteoarthritis of right knee 07/30/2017   S/P laparoscopic sleeve gastrectomy July 2018 08/26/2016   Preop cardiovascular exam 07/30/2016   Dyspnea on exertion 07/30/2016   Bilateral lower extremity edema 07/30/2016    PCP:  Glendale Chard, MD Pharmacy:  [facility] [place] [address] [place] [address] Phone: [phone] Fax: [phone]  Readmission Risk Interventions Readmission Risk Prevention Plan 11/05/2020  [place] or Home Care Consult Complete  Social Work Consult for [place] Planning/Counseling Complete  Palliative Care Screening Not Applicable  Some recent data might be hidden

## 2020-11-05 NOTE — Patient Instructions (Signed)
Goals Addressed             This Visit's Progress    COMPLETED: Complete Panniculectomy       Timeframe:  Long-Range Goal Priority:  Medium Start Date: 02/29/20                            Expected End Date: 12/24/20  Next Follow up date: 11/05/20  Patient Self-Care Activities:  Keep the following appointments related to pre-op and post-op Panniculectomy 09/04/20-Pre-Op visit for Panniculectomy procedure (Dr. Marla Roe) 09/24/20-Lab visit for Panniculectomy procedure 09/26/20-Panniculectomy outpatient/inpatient procedure  09/30/20-Post-Op Panniculectomy follow up appointment with Dr. Marla Roe  10/12/20-Post-Op Panniculectomy follow up appointments with Roetta Sessions, PA-C                       Wound infection - post Panniculectomy   On track    Timeframe:  Short-Term Goal Priority:  High Start Date:  11/05/20                          Expected End Date:  12/24/20   infected surgical wound: Patient will be admitted.  Wound care as well as IV vancomycin and cefepime.  Plastic surgery to follow in the morning.  She had a wound VAC.  May require replacement but may also require I&D before then.

## 2020-11-05 NOTE — Consult Note (Addendum)
Reason for Consult:Wound infection Referring Physician: [name] is an 75 y.o. female.  HPI: Teresa Golden is an 75 y.o. female with past medical history of HTN, CKD, and recent hospitalization 10/12/2020 to 10/22/2020 for wound infection secondary to panniculectomy performed 09/26/2020.  On examination today, the patient is doing well.  She expresses that she would like to go home with home health if possible.  Her preferred home health agency is [facility] in [place], [place].  Denies any overnight events.  Denies any worsening pain, fevers, chills, discharge, or other symptoms.  Past Medical History:  Diagnosis Date   Arthritis    Chronic kidney disease    self reports ckd stage 3    Depression    Diabetes mellitus, type II, insulin dependent ([place])    With neurologic complications. 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    Hypertensive nephropathy 07/05/2018   Hypothyroidism    Hypothyroidism 02/06/2018   Morbid obesity with BMI of 50.0-59.9, adult ([place])    Scoliosis     Past Surgical History:  Procedure Laterality Date   ABDOMINAL HYSTERECTOMY     uterus   CHOLECYSTECTOMY     DIAGNOSTIC LAPAROSCOPY     DILATION AND CURETTAGE OF UTERUS     EYE SURGERY     cataract extraction bilateral    INCISION AND DRAINAGE OF WOUND N/A 10/15/2020   Procedure: IRRIGATION AND DEBRIDEMENT WOUND;  Surgeon: Wallace Going, DO;  Location:  [facility];  Service: Plastics;  Laterality: N/A;  60 min   JOINT REPLACEMENT     Left knee   KNEE ARTHROPLASTY Right 07/30/2017   Procedure: RIGHT TOTAL KNEE ARTHROPLASTY WITH COMPUTER NAVIGATION;  Surgeon: Rod Can, MD;  Location: [facility];  Service: Orthopedics;  Laterality: Right;  Needs RNFA    LAPAROSCOPIC GASTRIC BANDING     and reversal   LAPAROSCOPIC GASTRIC SLEEVE RESECTION N/A 08/26/2016   Procedure: LAPAROSCOPIC GASTRIC SLEEVE RESECTION WITH UPPER ENOD;  Surgeon: Johnathan Hausen, MD;  Location: [facility];  Service: General;  Laterality: N/A;   PANNICULECTOMY N/A 09/26/2020   Procedure: PANNICULECTOMY;  Surgeon: Wallace Going, DO;  Location: [facility];  Service: Plastics;  Laterality: N/A;   TONSILLECTOMY     TRANSTHORACIC ECHOCARDIOGRAM  11/2013   EF 65-70%. Normal diastolic Fxn.  Normal Valves.   UMBILICAL HERNIA REPAIR N/A 09/26/2020   Procedure: HERNIA REPAIR UMBILICAL ADULT;  Surgeon: Georganna Skeans, MD;  Location: [facility];  Service: General;  Laterality: N/A;    Family History  Problem Relation Age of Onset   Heart attack Mother    Heart failure Mother    Stroke Father     Social History:  reports that she has quit smoking. Her smoking use included cigarettes. She has a 2.50 pack-year smoking history. She has never used smokeless tobacco. She reports that she does not currently use alcohol after a past usage of about 1.0 standard drink per week. She reports that she does not use drugs.  Allergies:  Allergies  Allergen Reactions   Meloxicam Other (See Comments)    Fever; muscle aches; "flu-like" symptoms "Allergic," per [facility]   Other Other (See Comments)    "Rose fever and  hay fever"    Medications: Prior to Admission:  Medications Prior to Admission  Medication Sig Dispense Refill Last Dose   acetaminophen (TYLENOL) 325 MG tablet Take 2 tablets (650 mg total) by mouth every 6 (six) hours as needed for moderate pain or mild pain.   11/02/2020 at 1246   amLODipine (NORVASC) 5 MG tablet Take 1 tablet by mouth once daily (Patient taking differently: Take 5 mg by mouth in the morning.) 90 tablet 0 11/02/2020 at 0900   B Complex-C (B-COMPLEX WITH VITAMIN C) tablet Take 1 tablet by mouth daily.   11/02/2020 at 0900   Biotin 10000 MCG TABS Take 10,000 mcg by mouth daily.    11/02/2020 at 0900   [EXPIRED] cephALEXin (KEFLEX) 250 MG capsule Take 1 capsule (250 mg total) by mouth 4 (four) times daily for 5 days. 20 capsule 0 11/02/2020 at 0900   Cholecalciferol 5000 units TABS Take 5,000 Units by mouth daily.   11/02/2020 at 0900   Colchicine 0.6 MG CAPS TAKE 1 CAPSULE DAILY AS    NEEDED (Patient taking differently: Take 0.6 mg by mouth daily as needed (for inflammation).) 90 capsule 1 unk   Cranberry-Vitamin C 250-60 MG CAPS Take 1 capsule by mouth daily.   11/02/2020 at 0900   diclofenac Sodium (VOLTAREN) 1 % GEL APPLY 2 GRAMS TO AFFECTED AREA(S) 4 TIMES DAILY AS NEEDED (Patient taking differently: Apply 2-4 g topically every 6 (six) hours as needed (to affected areas).) 200 g 2 unk   Dulaglutide (TRULICITY) A999333 0000000 SOPN Inject into the skin every Wednesday.   10/31/2020 at 0800   gabapentin (NEURONTIN) 300 MG capsule Take 600 mg by mouth at bedtime.   11/01/2020 at 2100   HYDROcodone-acetaminophen (NORCO/VICODIN) 5-325 MG tablet Take 1 tablet by mouth every 6 (six) hours as needed for severe pain. 10 tablet 0 11/01/2020 at 0230   losartan (COZAAR) 100 MG tablet Take 100 mg by mouth daily.   11/02/2020 at 0900   metoprolol succinate (TOPROL-XL) 25 MG 24 hr tablet 1 tablet by mouth daily at bedtime (Patient taking differently: Take 25 mg by mouth at bedtime.) 90 tablet 1 11/01/2020 at 2100   Multiple Vitamins-Minerals (MULTIVITAMIN WITH MINERALS) tablet Take 1 tablet by mouth daily. Women's 50+   11/02/2020 at 0900   ondansetron (ZOFRAN) 4 MG tablet TAKE 1 TABLET BY MOUTH EVERY 8 HOURS AS NEEDED FOR NAUSEA FOR VOMITING (Patient taking differently: Take 457 mg by mouth every 8 (eight) hours as needed for nausea or vomiting.) 20 tablet 0 10/30/2020 at 1457   polyethylene glycol (MIRALAX / GLYCOLAX) 17 g packet Take 17 g by mouth daily. 14 each 0 11/02/2020 at 0900   SEMGLEE, YFGN, 100 UNIT/ML Pen Inject 9 Units into the skin at bedtime.   11/01/2020 at 2100   simvastatin (ZOCOR) 10 MG tablet  Take 1 tablet (10 mg total) by mouth daily. (Patient taking differently: Take 10 mg by mouth at bedtime.) 90 tablet 1 11/01/2020 at 2100   SYNTHROID 112 MCG tablet Take 112 mcg by mouth daily before breakfast.   11/02/2020 at 0600   glucose blood (ONETOUCH VERIO) test strip Use as instructed to check blood sugars 2 times per day dx: e11.65 300 each 2    insulin degludec (TRESIBA FLEXTOUCH) 100 UNIT/ML SOPN FlexTouch Pen Inject 0.09 mLs (9 Units total) into the skin daily at 10 pm. (Patient not taking: Reported on 11/02/2020) 5 pen 2 Not Taking   OneTouch Delica Lancets 99991111 MISC Use  as instructed to check blood sugars 2 times per day dx: e11.65 300 each 2    OZEMPIC, 1 MG/DOSE, 2 MG/1.5ML SOPN INJECT INTO SKIN ONCE A WEEK (Patient not taking: Reported on 11/02/2020) 9 pen 1 Not Taking   SYNTHROID 25 MCG tablet Take 0.5 tablet (12.5 mcg) by mouth on Mondays through Fridays, then take 1 tablet (25 mcg) by mouth on Saturdays and Sundays. (Patient not taking: No sig reported) 90 tablet 1 Not Taking    Results for orders placed or performed during the hospital encounter of 11/02/20 (from the past 48 hour(s))  Glucose, capillary     Status: Abnormal   Collection Time: 11/03/20  4:23 PM  Result Value Ref Range   Glucose-Capillary 187 (H) 70 - 99 mg/dL    Comment: Glucose reference range applies only to samples taken after fasting for at least 8 hours.  Glucose, capillary     Status: None   Collection Time: 11/03/20  8:27 PM  Result Value Ref Range   Glucose-Capillary 85 70 - 99 mg/dL    Comment: Glucose reference range applies only to samples taken after fasting for at least 8 hours.  Vitamin B12     Status: Abnormal   Collection Time: 11/04/20  4:36 AM  Result Value Ref Range   Vitamin B-12 2,361 (H) 180 - 914 pg/mL    Comment: RESULTS CONFIRMED BY MANUAL DILUTION (NOTE) This assay is not validated for testing neonatal or myeloproliferative syndrome specimens for Vitamin B12 levels. Performed at [facility], [place] [address], [place], [place] [address]   Folate     Status: None   Collection Time: 11/04/20  4:36 AM  Result Value Ref Range   Folate 18.8 >5.9 ng/mL    Comment: Performed at [facility], [place] [address], [place], [place] [address]  Iron and TIBC     Status: Abnormal   Collection Time: 11/04/20  4:36 AM  Result Value Ref Range   Iron 15 (L) 28 - 170 ug/dL   TIBC 134 (L) 250 - 450 ug/dL   Saturation Ratios 11 10.4 - 31.8 %   UIBC 119 ug/dL    Comment: Performed at [facility], [place] [address] [place], [place] [address]  Ferritin     Status: None   Collection Time: 11/04/20  4:36 AM  Result Value Ref Range   Ferritin 214 11 - 307 ng/mL    Comment: Performed at [facility], [place] [address] [place], [place] [address]  Reticulocytes     Status: Abnormal   Collection Time: 11/04/20  4:36 AM  Result Value Ref Range   Retic Ct Pct 1.3 0.4 - 3.1 %   RBC. 2.67 (L) 3.87 - 5.11 MIL/uL   Retic Count, Absolute 33.6 19.0 - 186.0 K/uL   Immature Retic Fract 17.8 (H) 2.3 - 15.9 %    Comment: Performed at [facility], [place] [address], [place], [place] [address]  CBC with Differential/Platelet     Status: Abnormal   Collection Time: 11/04/20  4:36 AM  Result Value Ref Range   WBC 5.1 4.0 - 10.5 K/uL   RBC 2.72 (L) 3.87 - 5.11 MIL/uL   Hemoglobin 7.6 (L) 12.0 - 15.0 g/dL   HCT 23.1 (L) 36.0 - 46.0 %   MCV 84.9 80.0 - 100.0 fL   MCH 27.9 26.0 - 34.0 pg   MCHC 32.9 30.0 - 36.0 g/dL   RDW 16.2 (H)  11.5 - 15.5 %   Platelets 202 150 - 400 K/uL   nRBC 0.0 0.0 - 0.2 %   Neutrophils Relative % 43 %   Neutro Abs 2.2 1.7 - 7.7 K/uL   Lymphocytes Relative 29 %   Lymphs Abs 1.5 0.7 - 4.0 K/uL   Monocytes Relative 19 %   Monocytes Absolute 1.0 0.1 - 1.0 K/uL   Eosinophils Relative 7 %   Eosinophils Absolute 0.4 0.0 - 0.5 K/uL   Basophils Relative 1 %   Basophils Absolute 0.1 0.0 - 0.1 K/uL    Immature Granulocytes 1 %   Abs Immature Granulocytes 0.05 0.00 - 0.07 K/uL    Comment: Performed at [facility], [place] [address] [place], [place] [id number]  Basic metabolic panel     Status: Abnormal   Collection Time: 11/04/20  4:36 AM  Result Value Ref Range   Sodium 138 135 - 145 mmol/L   Potassium 3.9 3.5 - 5.1 mmol/L   Chloride 106 98 - 111 mmol/L   CO2 25 22 - 32 mmol/L   Glucose, Bld 105 (H) 70 - 99 mg/dL    Comment: Glucose reference range applies only to samples taken after fasting for at least 8 hours.   BUN 11 8 - 23 mg/dL   Creatinine, Ser 1.02 (H) 0.44 - 1.00 mg/dL   Calcium 8.5 (L) 8.9 - 10.3 mg/dL   GFR, Estimated 57 (L) >60 mL/min    Comment: (NOTE) Calculated using the CKD-EPI Creatinine Equation (2021)    Anion gap 7 5 - 15    Comment: Performed at [facility], [place] [address] [place], [place] [address]  Glucose, capillary     Status: None   Collection Time: 11/04/20  7:34 AM  Result Value Ref Range   Glucose-Capillary 93 70 - 99 mg/dL    Comment: Glucose reference range applies only to samples taken after fasting for at least 8 hours.  Glucose, capillary     Status: Abnormal   Collection Time: 11/04/20 11:35 AM  Result Value Ref Range   Glucose-Capillary 173 (H) 70 - 99 mg/dL    Comment: Glucose reference range applies only to samples taken after fasting for at least 8 hours.  Glucose, capillary     Status: None   Collection Time: 11/04/20  4:43 PM  Result Value Ref Range   Glucose-Capillary 92 70 - 99 mg/dL    Comment: Glucose reference range applies only to samples taken after fasting for at least 8 hours.  Glucose, capillary     Status: None   Collection Time: 11/04/20  9:41 PM  Result Value Ref Range   Glucose-Capillary 95 70 - 99 mg/dL    Comment: Glucose reference range applies only to samples taken after fasting for at least 8 hours.  CBC with Differential/Platelet     Status: Abnormal   Collection Time:  11/05/20  3:50 AM  Result Value Ref Range   WBC 5.1 4.0 - 10.5 K/uL   RBC 2.77 (L) 3.87 - 5.11 MIL/uL   Hemoglobin 7.7 (L) 12.0 - 15.0 g/dL   HCT 23.4 (L) 36.0 - 46.0 %   MCV 84.5 80.0 - 100.0 fL   MCH 27.8 26.0 - 34.0 pg   MCHC 32.9 30.0 - 36.0 g/dL   RDW 16.0 (H) 11.5 - 15.5 %   Platelets 203 150 - 400 K/uL   nRBC 0.0 0.0 - 0.2 %   Neutrophils Relative % 39 %  Neutro Abs 2.0 1.7 - 7.7 K/uL   Lymphocytes Relative 34 %   Lymphs Abs 1.7 0.7 - 4.0 K/uL   Monocytes Relative 17 %   Monocytes Absolute 0.9 0.1 - 1.0 K/uL   Eosinophils Relative 8 %   Eosinophils Absolute 0.4 0.0 - 0.5 K/uL   Basophils Relative 1 %   Basophils Absolute 0.1 0.0 - 0.1 K/uL   Immature Granulocytes 1 %   Abs Immature Granulocytes 0.05 0.00 - 0.07 K/uL    Comment: Performed at [facility], [place] [address] [place], [place] [id number]  Basic metabolic panel     Status: None   Collection Time: 11/05/20  3:50 AM  Result Value Ref Range   Sodium 140 135 - 145 mmol/L   Potassium 3.7 3.5 - 5.1 mmol/L   Chloride 108 98 - 111 mmol/L   CO2 25 22 - 32 mmol/L   Glucose, Bld 95 70 - 99 mg/dL    Comment: Glucose reference range applies only to samples taken after fasting for at least 8 hours.   BUN 10 8 - 23 mg/dL   Creatinine, Ser 0.92 0.44 - 1.00 mg/dL   Calcium 8.9 8.9 - 10.3 mg/dL   GFR, Estimated >60 >60 mL/min    Comment: (NOTE) Calculated using the CKD-EPI Creatinine Equation (2021)    Anion gap 7 5 - 15    Comment: Performed at [facility], [place] [address] [place], [place] [address]  Glucose, capillary     Status: Abnormal   Collection Time: 11/05/20  7:28 AM  Result Value Ref Range   Glucose-Capillary 125 (H) 70 - 99 mg/dL    Comment: Glucose reference range applies only to samples taken after fasting for at least 8 hours.  Glucose, capillary     Status: Abnormal   Collection Time: 11/05/20 11:41 AM  Result Value Ref Range   Glucose-Capillary 104 (H) 70 - 99  mg/dL    Comment: Glucose reference range applies only to samples taken after fasting for at least 8 hours.    No results found.  ROS Blood pressure (!) 138/54, pulse 63, temperature 99.2 F (37.3 C), temperature source Oral, resp. rate 16, SpO2 100 %. Physical Exam Constitutional:      General: She is not in acute distress.    Appearance: Normal appearance. She is not toxic-appearing.  Eyes:     Extraocular Movements: Extraocular movements intact.     Pupils: Pupils are equal, round, and reactive to light.  Cardiovascular:     Rate and Rhythm: Normal rate.  Pulmonary:     Effort: Pulmonary effort is normal.  Musculoskeletal:     Cervical back: Normal range of motion. No rigidity.  Skin:    Comments: Wet to dry dressing.  No surrounding erythema or induration.  Not malodorous.  Neurological:     Mental Status: She is alert and oriented to person, place, and time.  Psychiatric:        Mood and Affect: Mood normal.        Behavior: Behavior normal.    Assessment/Plan:  Discussed with patient options of either proceeding with surgical closure in OR versus continued conservative management at home with continued wet-to-dry dressing changes.    Patient expressed that she would like to proceed with continued conservative management.  She denies any incisional/abdominal pain, fevers, redness or swelling, or other symptoms concerning for infection.  Called our office regarding home health with [facility] and should have further  clarification tomorrow.  If home health is available 3 times per week, reasonable to discharge home with wound VAC.  Will communicate with LCSW and TOC.    Dr. Marla Roe personally evaluated patient at bedside and agrees with plan.   Krista Blue 11/05/2020, 3:55 PM

## 2020-11-05 NOTE — Chronic Care Management (AMB) (Signed)
Chronic Care Management   CCM RN Visit Note  11/05/2020 Name: Teresa Golden MRN: [id number] DOB: 02-04-1946  Subjective: Teresa Golden is a 75 y.o. year old female who is a primary care patient of Glendale Chard, MD. The care management team was consulted for assistance with disease management and care coordination needs.    Chart Review  for  RN CM Follow up  in response to provider referral for case management and/or care coordination services.   Consent to Services:  The patient was given information about Chronic Care Management services, agreed to services, and gave verbal consent prior to initiation of services.  Please see initial visit note for detailed documentation.   Patient agreed to services and verbal consent obtained.   Assessment: Review of patient past medical history, allergies, medications, health status, including review of consultants reports, laboratory and other test data, was performed as part of comprehensive evaluation and provision of chronic care management services.   SDOH (Social Determinants of Health) assessments and interventions performed:    CCM Care Plan  Allergies  Allergen Reactions   Meloxicam Other (See Comments)    Fever; muscle aches; "flu-like" symptoms "Allergic," per [facility]   Other Other (See Comments)    "Rose fever and hay fever"    Facility-Administered Encounter Medications as of 11/05/2020  Medication   0.9 %  sodium chloride infusion   acetaminophen (TYLENOL) tablet 650 mg   Or   acetaminophen (TYLENOL) suppository 650 mg   amLODipine (NORVASC) tablet 5 mg   ceFEPIme (MAXIPIME) 2 g in sodium chloride 0.9 % 100 mL IVPB   colchicine tablet 0.6 mg   diclofenac Sodium (VOLTAREN) 1 % topical gel 2-4 g   enoxaparin (LOVENOX) injection 40 mg   gabapentin (NEURONTIN) capsule 600 mg   insulin aspart (novoLOG) injection 0-20 Units   insulin aspart (novoLOG) injection 0-5 Units   insulin glargine-yfgn (SEMGLEE) injection 9 Units    levothyroxine (SYNTHROID) tablet 112 mcg   metoprolol succinate (TOPROL-XL) 24 hr tablet 25 mg   ondansetron (ZOFRAN) tablet 4 mg   Or   ondansetron (ZOFRAN) injection 4 mg   oxyCODONE-acetaminophen (PERCOCET/ROXICET) 5-325 MG per tablet 1 tablet   simvastatin (ZOCOR) tablet 10 mg   vancomycin (VANCOCIN) IVPB 1000 mg/200 mL premix   Outpatient Encounter Medications as of 11/05/2020  Medication Sig Note   acetaminophen (TYLENOL) 325 MG tablet Take 2 tablets (650 mg total) by mouth every 6 (six) hours as needed for moderate pain or mild pain.    amLODipine (NORVASC) 5 MG tablet Take 1 tablet by mouth once daily (Patient taking differently: Take 5 mg by mouth in the morning.)    B Complex-C (B-COMPLEX WITH VITAMIN C) tablet Take 1 tablet by mouth daily.    Biotin 10000 MCG TABS Take 10,000 mcg by mouth daily.    Cholecalciferol 5000 units TABS Take 5,000 Units by mouth daily.    Colchicine 0.6 MG CAPS TAKE 1 CAPSULE DAILY AS    NEEDED (Patient taking differently: Take 0.6 mg by mouth daily as needed (for inflammation).)    Cranberry-Vitamin C 250-60 MG CAPS Take 1 capsule by mouth daily.    diclofenac Sodium (VOLTAREN) 1 % GEL APPLY 2 GRAMS TO AFFECTED AREA(S) 4 TIMES DAILY AS NEEDED (Patient taking differently: Apply 2-4 g topically every 6 (six) hours as needed (to affected areas).)    Dulaglutide (TRULICITY) A999333 0000000 SOPN Inject into the skin every Wednesday. 11/02/2020: MAR left the dosage as "1 dose"- not  sure why??   gabapentin (NEURONTIN) 300 MG capsule Take 600 mg by mouth at bedtime.    glucose blood (ONETOUCH VERIO) test strip Use as instructed to check blood sugars 2 times per day dx: e11.65    HYDROcodone-acetaminophen (NORCO/VICODIN) 5-325 MG tablet Take 1 tablet by mouth every 6 (six) hours as needed for severe pain.    insulin degludec (TRESIBA FLEXTOUCH) 100 UNIT/ML SOPN FlexTouch Pen Inject 0.09 mLs (9 Units total) into the skin daily at 10 pm. (Patient not taking: Reported on  11/02/2020)    losartan (COZAAR) 100 MG tablet Take 100 mg by mouth daily.    metoprolol succinate (TOPROL-XL) 25 MG 24 hr tablet 1 tablet by mouth daily at bedtime (Patient taking differently: Take 25 mg by mouth at bedtime.)    Multiple Vitamins-Minerals (MULTIVITAMIN WITH MINERALS) tablet Take 1 tablet by mouth daily. Women's 50+    ondansetron (ZOFRAN) 4 MG tablet TAKE 1 TABLET BY MOUTH EVERY 8 HOURS AS NEEDED FOR NAUSEA FOR VOMITING (Patient taking differently: Take 457 mg by mouth every 8 (eight) hours as needed for nausea or vomiting.)    OneTouch Delica Lancets 99991111 MISC Use as instructed to check blood sugars 2 times per day dx: e11.65    OZEMPIC, 1 MG/DOSE, 2 MG/1.5ML SOPN INJECT INTO SKIN ONCE A WEEK (Patient not taking: Reported on 11/02/2020)    polyethylene glycol (MIRALAX / GLYCOLAX) 17 g packet Take 17 g by mouth daily.    SEMGLEE, YFGN, 100 UNIT/ML Pen Inject 9 Units into the skin at bedtime.    simvastatin (ZOCOR) 10 MG tablet Take 1 tablet (10 mg total) by mouth daily. (Patient taking differently: Take 10 mg by mouth at bedtime.)    SYNTHROID 112 MCG tablet Take 112 mcg by mouth daily before breakfast.    SYNTHROID 25 MCG tablet Take 0.5 tablet (12.5 mcg) by mouth on Mondays through Fridays, then take 1 tablet (25 mcg) by mouth on Saturdays and Sundays. (Patient not taking: No sig reported)     Patient Active Problem List   Diagnosis Date Noted   Infected wound 11/02/2020   Pressure injury of skin 10/22/2020   Wound infection after surgery 10/12/2020   Normocytic anemia 10/12/2020   AKI (acute kidney injury) ([place]) 10/12/2020   Obesity (BMI 30-39.9) 10/12/2020   Sepsis ([place]) 10/12/2020   Type 2 diabetes mellitus with stage 3 chronic kidney disease, with long-term current use of insulin ([place]) 11/29/2019   Panniculitis 09/30/2019   Back pain 09/30/2019   Hepatoma ([place]) 01/21/2019   Other cirrhosis of liver ([place]) 07/05/2018   Hypertensive nephropathy 07/05/2018   Chronic renal  disease, stage III ([place]) 07/05/2018   Chronic left shoulder pain 07/05/2018   Hypothyroidism 02/06/2018   Hepatitis C virus infection cured after antiviral drug therapy 02/04/2018   Osteoarthritis of right knee 07/30/2017   S/P laparoscopic sleeve gastrectomy July 2018 08/26/2016   Preop cardiovascular exam 07/30/2016   Dyspnea on exertion 07/30/2016   Bilateral lower extremity edema 07/30/2016    Conditions to be addressed/monitored: DM, CKD III, Hypertensive Nephropathy, Class 2 severe obesity, Anxiety, Panniculitis   Care Plan : Elective Surgery  Updates made by Lynne Logan, RN since 11/05/2020 12:00 AM     Problem: Panniculectomy Procedure   Priority: Medium     Long-Range Goal: Manage stretched out, excess fat and overhanging skin from your abdomen   Start Date: 02/29/2020  Expected End Date: 12/24/2020  Recent Progress: On track  Priority: Medium  Note:  Current Barriers:  Ineffective Self Health Maintenance Currently UNABLE TO independently self manage needs related to chronic health conditions.  Knowledge Deficits related to short term plan for care coordination needs and long term plans for chronic disease management needs Nurse Case Manager Clinical Goal(s):  Patient will work with care management team to address care coordination and chronic disease management needs related to Disease Management Educational Needs Care Coordination Medication Management and Education Psychosocial Support   Interventions:  09/12/20 completed successful outbound call with patient  Determined patient will undergo a Panniculectomy procedure by Dr. Marla Roe on 09/26/20 Determined this procedure is scheduled for outpatient but patient may need inpatient services pending hemodynamic stability following the procedure Determined and discussed the following appointments related to this procedure:  09/04/20-Pre-Op visit for Panniculectomy procedure (Dr. Marla Roe) 09/24/20-Lab visit for  Panniculectomy procedure 09/26/20-Panniculectomy outpatient/inpatient procedure  09/30/20-Post-Op Panniculectomy follow up appointment with Dr. Marla Roe  10/12/20-Post-Op Panniculectomy follow up appointments with Roetta Sessions, PA-C  Determined patient will have a friend available and use an agency referred by [facility] to assist her as needed following surgery Discussed plans with patient for ongoing care management follow up and provided patient with direct contact information for care management team. 10/03/20 completed inbound call with patient  Determined patient is experiencing bleeding around her wound drain that has increased throughout the morning Determined patient contacted her surgeon Dr. Marla Roe to report her symptoms, she was advised to come into the office for further evaluation  Determined patient's friend is driving her into the office now  Instructed patient to continue to follow MD recommendations and to continue to report concerns to her MD as directed  Discussed plans with patient for ongoing care management follow up and provided patient with direct contact information for care management team 10/15/20 completed successful outbound call with patient  Determined patient is inpatient at [facility] for wound infection following the Panniculectomy procedure she underwent on 09/26/20 Determined patient will undergo an irrigation and debridement procedure today and is currently receiving IV antibiotics, she was transfused 2 units of packed red blood cells on 10/14/20 due to having H/H 7.3/22.7 Discussed discharge planning includes plans to d/c to inpatient rehab once ready Discussed plans with patient for ongoing care management follow up and provided patient with direct contact information for care management team 11/05/20 Chart Review Determined patient was readmitted to [facility] on 11/02/20 to present, dx: infected wound with the following Assessment/Plan  noted: Assessment/Plan Principal Problem:   Wound infection after surgery Active Problems:   Hepatitis C virus infection cured after antiviral drug therapy   Hypothyroidism   Other cirrhosis of liver ([place])   Chronic renal disease, stage III ([place])   Type 2 diabetes mellitus with stage 3 chronic kidney disease, with long-term current use of insulin ([place])   Obesity (BMI 30-39.9)  #1 infected surgical wound: Patient will be admitted.  Wound care as well as IV vancomycin and cefepime.  Plastic surgery to follow in the morning.  She had a wound VAC.  May require replacement but may also require I&D before then.   #2 diabetes type 2: Sliding scale insulin.  Monitor.   #3 hypothyroidism: TSH seem to be normal.  Continue home regimen once confirmed   #4 chronic kidney disease stage III: Continue to monitor renal function.  Appears to be at baseline.   #5 history of hepatitis C: Appears to have been in remission after treatment.   #6 liver cirrhosis: No decompensation.  Continue to monitor  DVT prophylaxis: Lovenox Code Status: Full Family Communication: No family at bedside Disposition Plan: Back to skilled facility Consults called: Plastic surgery Dr. Marla Roe Admission status: Inpatient   Severity of Illness: The appropriate patient status for this patient is INPATIENT. Inpatient status is judged to be reasonable and necessary in order to provide the required intensity of service to ensure the patient's safety. The patient's presenting symptoms, physical exam findings, and initial radiographic and laboratory data in the context of their chronic comorbidities is felt to place them at high risk for further clinical deterioration. Furthermore, it is not anticipated that the patient will be medically stable for discharge from the hospital within 2 midnights of admission. The following factors support the patient status of inpatient.    "           The patient's presenting symptoms include  infected wound. "           The worrisome physical exam findings include wound drainage. "           The initial radiographic and laboratory data are worrisome because of hyperglycemia. "           The chronic co-morbidities include diabetes.   Patient Self-Care Activities:  Keep the following appointments related to pre-op and post-op Panniculectomy 09/04/20-Pre-Op visit for Panniculectomy procedure (Dr. Marla Roe) 09/24/20-Lab visit for Panniculectomy procedure 09/26/20-Panniculectomy outpatient/inpatient procedure  09/30/20-Post-Op Panniculectomy follow up appointment with Dr. Marla Roe  10/12/20-Post-Op Panniculectomy follow up appointments with Roetta Sessions, PA-C               Follow Up Plan: The care management team will reach out to the patient again upon her discharge home     Follow Up Plan: The care management team will reach out to the patient again upon her discharge home  Barb Merino, RN, BSN, CCM Care Management Coordinator [facility]  Direct Phone: [phone]

## 2020-11-06 ENCOUNTER — Other Ambulatory Visit (HOSPITAL_COMMUNITY): Payer: Self-pay

## 2020-11-06 LAB — BASIC METABOLIC PANEL
Anion gap: 9 (ref 5–15)
BUN: 9 mg/dL (ref 8–23)
CO2: 25 mmol/L (ref 22–32)
Calcium: 9.2 mg/dL (ref 8.9–10.3)
Chloride: 109 mmol/L (ref 98–111)
Creatinine, Ser: 0.89 mg/dL (ref 0.44–1.00)
GFR, Estimated: >60 mL/min (ref >60)
Glucose, Bld: 75 mg/dL (ref 70–99)
Potassium: 3.6 mmol/L (ref 3.5–5.1)
Sodium: 143 mmol/L (ref 135–145)

## 2020-11-06 LAB — CBC WITH DIFFERENTIAL/PLATELET
Abs Immature Granulocytes: 0.06 10*3/uL (ref 0.00–0.07)
Basophils Absolute: 0.1 10*3/uL (ref 0.0–0.1)
Basophils Relative: 1 %
Eosinophils Absolute: 0.4 10*3/uL (ref 0.0–0.5)
Eosinophils Relative: 7 %
HCT: 23.3 % — ABNORMAL LOW (ref 36.0–46.0)
Hemoglobin: 7.6 g/dL — ABNORMAL LOW (ref 12.0–15.0)
Immature Granulocytes: 1 %
Lymphocytes Relative: 32 %
Lymphs Abs: 1.6 10*3/uL (ref 0.7–4.0)
MCH: 27.3 pg (ref 26.0–34.0)
MCHC: 32.6 g/dL (ref 30.0–36.0)
MCV: 83.8 fL (ref 80.0–100.0)
Monocytes Absolute: 0.7 10*3/uL (ref 0.1–1.0)
Monocytes Relative: 14 %
Neutro Abs: 2.3 10*3/uL (ref 1.7–7.7)
Neutrophils Relative %: 45 %
Platelets: 211 10*3/uL (ref 150–400)
RBC: 2.78 MIL/uL — ABNORMAL LOW (ref 3.87–5.11)
RDW: 16.1 % — ABNORMAL HIGH (ref 11.5–15.5)
WBC: 5.1 10*3/uL (ref 4.0–10.5)
nRBC: 0 % (ref 0.0–0.2)

## 2020-11-06 LAB — GLUCOSE, CAPILLARY
Glucose-Capillary: 88 mg/dL (ref 70–99)
Glucose-Capillary: 141 mg/dL — ABNORMAL HIGH (ref 70–99)
Glucose-Capillary: 108 mg/dL — ABNORMAL HIGH (ref 70–99)
Glucose-Capillary: 121 mg/dL — ABNORMAL HIGH (ref 70–99)

## 2020-11-06 MED ORDER — INSULIN GLARGINE-YFGN 100 UNIT/ML ~~LOC~~ SOLN
5.0000 [IU] | Freq: Every day | SUBCUTANEOUS | Status: DC
  Administered 2020-11-06: 5 [IU] via SUBCUTANEOUS
  Filled 2020-11-06: qty 0.05

## 2020-11-06 MED ORDER — CEPHALEXIN 500 MG PO CAPS
500.0000 mg | ORAL_CAPSULE | Freq: Three times a day (TID) | ORAL | 0 refills | Status: AC
  Filled 2020-11-06: qty 15, 5d supply, fill #0

## 2020-11-06 MED ORDER — INSULIN DEGLUDEC 100 UNIT/ML ~~LOC~~ SOPN
5.0000 [IU] | PEN_INJECTOR | Freq: Every day | SUBCUTANEOUS | 0 refills | Status: AC
  Filled 2020-11-06: qty 3, 60d supply, fill #0

## 2020-11-06 MED ORDER — OXYCODONE-ACETAMINOPHEN 5-325 MG PO TABS
1.0000 | ORAL_TABLET | Freq: Four times a day (QID) | ORAL | 0 refills | Status: AC | PRN
  Filled 2020-11-06: qty 20, 5d supply, fill #0

## 2020-11-06 MED ORDER — AMLODIPINE BESYLATE 5 MG PO TABS
5.0000 mg | ORAL_TABLET | Freq: Every morning | ORAL | 0 refills | Status: DC
  Filled 2020-11-06: qty 30, 30d supply, fill #0

## 2020-11-06 MED ORDER — METOPROLOL SUCCINATE ER 25 MG PO TB24
ORAL_TABLET | ORAL | 1 refills | Status: DC
  Filled 2020-11-06: qty 90, 90d supply, fill #0

## 2020-11-06 MED ORDER — COLCHICINE 0.6 MG PO TABS
0.6000 mg | ORAL_TABLET | Freq: Every day | ORAL | 0 refills | Status: DC
  Filled 2020-11-06: qty 7, 7d supply, fill #0

## 2020-11-06 MED ORDER — SACCHAROMYCES BOULARDII 250 MG PO CAPS
250.0000 mg | ORAL_CAPSULE | Freq: Two times a day (BID) | ORAL | Status: DC
  Administered 2020-11-06 – 2020-11-07 (×3): 250 mg via ORAL
  Filled 2020-11-06 (×3): qty 1

## 2020-11-06 MED ORDER — CEPHALEXIN 500 MG PO CAPS
500.0000 mg | ORAL_CAPSULE | Freq: Three times a day (TID) | ORAL | Status: DC
  Administered 2020-11-06 – 2020-11-07 (×3): 500 mg via ORAL
  Filled 2020-11-06 (×3): qty 1

## 2020-11-06 MED ORDER — SACCHAROMYCES BOULARDII 250 MG PO CAPS
250.0000 mg | ORAL_CAPSULE | Freq: Two times a day (BID) | ORAL | 0 refills | Status: AC
  Filled 2020-11-06: qty 10, 5d supply, fill #0

## 2020-11-06 NOTE — Progress Notes (Signed)
Mobility Specialist - Progress Note    11/06/20 1528  Mobility  Activity Dangled on edge of bed  Range of Motion/Exercises Left leg;Right leg;Active  Level of Assistance Independent  Assistive Device None  Mobility Sit up in bed/chair position for meals  Mobility Response Tolerated well  Mobility performed by Mobility specialist  $Mobility charge 1 Mobility    Upon entry pt stated feeling "worn out" from physical therapy and did not want to walk, but was agreeable to complete bed exercises. Pt completed 4 exercises, leg raises, adduction and abduction slides, heel slides, and ankle pumps on each leg. Each exercise was completed x2 on each leg. Once completed, pt returned to laying in bed and was left with call bell at side. Will attempt to ambulate with pt when schedule permits.   [place] Specialist Acute Rehabilitation Services Phone: [phone] 11/06/20, 3:33 PM

## 2020-11-06 NOTE — Discharge Summary (Signed)
Physician Discharge Summary  Teresa Golden [id number] DOB: 10/09/1945 DOA: 11/02/2020  PCP: Glendale Chard, MD  Admit date: 11/02/2020 Discharge date: 11/06/2020  Admitted From: SNF SNF Discharge disposition: SNF   Code Status: Full Code   Discharge Diagnosis:   Principal Problem:   Wound infection after surgery Active Problems:   Hepatitis C virus infection cured after antiviral drug therapy   Hypothyroidism   Other cirrhosis of liver ([place])   Chronic renal disease, stage III ([place])   Type 2 diabetes mellitus with stage 3 chronic kidney disease, with long-term current use of insulin ([place])   Obesity (BMI 30-39.9)     Chief Complaint  Patient presents with   Wound Check    Brief narrative: Teresa Golden is a 75 y.o. female with PMH significant for DM2, HTN, CKD 3, GERD, hep C, hypothyroidism, obesity, scoliosis who had panniculectomy and supraumbilical hernia repair performed on August 3 by plastic surgeon Dr. Marla Roe.   She was seen as an outpatient by plastic surgery on 8/19 for fever, chills, wound infection.  She was hospitalized, underwent irrigation and debridement with wound VAC placement on 8/22.  She was discharged to [facility] facility on 8/29.   On 9/9, patient was sent from the facility with fever, concern of surgical site infection and patient's noncompliance to wound VAC.  In the ED, patient had a temperature of 99, hemodynamically stable Labs with WBC count normal, lactic acid level normal, creatinine elevated 1.52 ED physician discussed the case with plastic surgeon.  Recommended admission for IV antibiotics. Admitted to hospitalist service Plastic surgery following.  Subjective: Patient was seen and examined this morning.  Lying in bed.  Not in distress.  Ankle pain improving.  Patient states she does not want to go to any SNF and is strongly considering to go home.  Hospital course: Surgical site infection -Currently IV vancomycin  and IV cefepime.  Currently no systemic symptoms and signs of infection.   -No need of surgical intervention at this time per plastic surgery.  Recommended Keflex 5 mg 3 times daily for next 5 days and wet-to-dry dressing 3 times a week. -TOC looking for home health care agency. Recent Labs  Lab 11/02/20 1524 11/02/20 2200 11/03/20 0045 11/04/20 0436 11/05/20 0350 11/06/20 0344  WBC 7.2  --  6.5 5.1 5.1 5.1  LATICACIDVEN  --  1.2  --   --   --   --    AKI on CKD stage IIIa -Baseline creatinine 1.1 on 9/5.  Presented with creatinine elevated to 1.52.  Improved back to baseline with IV fluid.  Encourage oral hydration.  Acute gout flareup History of gout -On 9/10, patient had mild to moderate tenderness present on the lateral aspect right malleolus.  No evidence of cellulitis.  Pain slightly worse on active and passive movement of the ankle. -Patient has history of gout and is on as needed colchicine at home.  Colchicine, Neurontin and as needed Norco were resumed.  Pain significantly improving.  Chronic anemia -Hemoglobin level between 8 and 9, at baseline.  Currently running slightly low between 7 and 8.  No evidence of bleeding. -Iron panel showed adequate level of ferritin. Recent Labs    05/31/20 1041 09/18/20 0856 11/02/20 1524 11/03/20 0045 11/04/20 0436 11/05/20 0350 11/06/20 0344  HGB 11.4   < > 9.1* 8.6* 7.6* 7.7* 7.6*  MCV 82   < > 87.2 84.8 84.9 84.5 83.8  VITAMINB12 >2000*  --   --   --  2,361*  --   --   FOLATE  --   --   --   --  18.8  --   --   FERRITIN 209*  --   --   --  214  --   --   TIBC 302  --   --   --  134*  --   --   IRON 94  --   --   --  15*  --   --   RETICCTPCT  --   --   --   --  1.3  --   --    < > = values in this interval not displayed.   Type 2 diabetes mellitus -A1c 6.4 on 09/18/2020 -Home meds include Semglee 9 units at bedtime, Trulicity [id number] mg every Saturday. -Currently on Semglee 9 units nightly along with sliding scale insulin with  Accu-Cheks. -I noted that patient's blood sugar level has been running low and was 63 at the lowest last night. -I would reduce the dose of Semglee to 5 units nightly. -Trulicity can resume post discharge. Recent Labs  Lab 11/05/20 1853 11/05/20 1915 11/05/20 2118 11/06/20 0735 11/06/20 1107  GLUCAP 63* 104* 101* 88 141*   Essential hypertension -Home meds include Toprol 25 mg daily, amlodipine 5 mg daily, losartan 100 mg daily -Currently blood pressure is stable on Toprol and amlodipine.  Losartan is on hold.  I would not resume it at discharge.  Follow-up with PCP as an outpatient.  Hyperlipidemia -Continue simvastatin  History of hepatitis C, liver cirrhosis -On Toprol.  Not on diuretics or lactulose. -Liver enzymes normal.  Hypothyroidism -Continue Synthroid   Allergies as of 11/06/2020       Reactions   Meloxicam Other (See Comments)   Fever; muscle aches; "flu-like" symptoms "Allergic," per MAR   Other Other (See Comments)   "Rose fever and hay fever"        Medication List     STOP taking these medications    Colchicine 0.6 MG Caps Replaced by: colchicine 0.6 MG tablet   HYDROcodone-acetaminophen 5-325 MG tablet Commonly known as: NORCO/VICODIN   insulin degludec 100 UNIT/ML FlexTouch Pen Commonly known as: [name] FlexTouch   losartan 100 MG tablet Commonly known as: COZAAR   ondansetron 4 MG tablet Commonly known as: ZOFRAN   Ozempic (1 MG/DOSE) 2 MG/1.5ML Sopn Generic drug: Semaglutide (1 MG/DOSE)       TAKE these medications    acetaminophen 325 MG tablet Commonly known as: TYLENOL Take 2 tablets (650 mg total) by mouth every 6 (six) hours as needed for moderate pain or mild pain.   amLODipine 5 MG tablet Commonly known as: NORVASC Take 1 tablet (5 mg total) by mouth in the morning.   B-complex with vitamin C tablet Take 1 tablet by mouth daily.   Biotin 10000 MCG Tabs Take 10,000 mcg by mouth daily.   cephALEXin 500 MG  capsule Commonly known as: KEFLEX Take 1 capsule (500 mg total) by mouth every 8 (eight) hours for 5 days. What changed:  medication strength how much to take when to take this   Cholecalciferol 125 MCG (5000 UT) Tabs Take 5,000 Units by mouth daily.   colchicine 0.6 MG tablet Take 1 tablet (0.6 mg total) by mouth daily for 7 days. Start taking on: November 07, 2020 Replaces: Colchicine 0.6 MG Caps   Cranberry-Vitamin C 250-60 MG Caps Take 1 capsule by mouth daily.   diclofenac Sodium 1 % Gel  Commonly known as: VOLTAREN APPLY 2 GRAMS TO AFFECTED AREA(S) 4 TIMES DAILY AS NEEDED What changed: See the new instructions.   gabapentin 300 MG capsule Commonly known as: NEURONTIN Take 600 mg by mouth at bedtime.   metoprolol succinate 25 MG 24 hr tablet Commonly known as: TOPROL-XL 1 tablet by mouth daily at bedtime What changed:  how much to take how to take this when to take this additional instructions   multivitamin with minerals tablet Take 1 tablet by mouth daily. Women's [id number]   OneTouch Delica Lancets 99991111 Misc Use as instructed to check blood sugars 2 times per day dx: e11.65   OneTouch Verio test strip Generic drug: glucose blood Use as instructed to check blood sugars 2 times per day dx: e11.65   oxyCODONE-acetaminophen 5-325 MG tablet Commonly known as: PERCOCET/ROXICET Take 1 tablet by mouth every 6 (six) hours as needed for up to 5 days for severe pain.   polyethylene glycol 17 g packet Commonly known as: MIRALAX / GLYCOLAX Take 17 g by mouth daily.   saccharomyces boulardii 250 MG capsule Commonly known as: FLORASTOR Take 1 capsule (250 mg total) by mouth 2 (two) times daily for 5 days.   Semglee (yfgn) 100 UNIT/ML Solostar Pen Generic drug: insulin glargine Inject 5 Units into the skin at bedtime. What changed: how much to take   simvastatin 10 MG tablet Commonly known as: ZOCOR Take 1 tablet (10 mg total) by mouth daily. What changed: when to  take this   Synthroid 112 MCG tablet Generic drug: levothyroxine Take 112 mcg by mouth daily before breakfast. What changed: Another medication with the same name was removed. Continue taking this medication, and follow the directions you see here.   Trulicity A999333 [id number] Sopn Generic drug: Dulaglutide Inject into the skin every Wednesday.               Discharge Care Instructions  (From admission, onward)           Start     Ordered   11/06/20 0000  Discharge wound care:       Comments: Pressure Injury 10/21/20 Sacrum Stage 2 -  Partial thickness loss of dermis presenting as a shallow open injury with a red, pink wound bed without slough. 15 days   11/06/20 1600            Discharge Instructions:  Diet Recommendation:  Discharge Diet Orders (From admission, onward)     Start     Ordered   11/06/20 0000  Diet - low sodium heart healthy        11/06/20 1600   11/06/20 0000  Diet Carb Modified        11/06/20 1600              Follow with Primary MD Glendale Chard, MD in 7 days   Get CBC/BMP checked in next visit within 1 week by PCP or SNF MD ( we routinely change or add medications that can affect your baseline labs and fluid status, therefore we recommend that you get the mentioned basic workup next visit with your PCP, your PCP may decide not to get them or add new tests based on their clinical decision)  On your next visit with your PCP, please Get Medicines reviewed and adjusted.  Please request your PCP  to go over all Hospital Tests and Procedure/Radiological results at the follow up, please get all Hospital records sent to your Prim MD by signing hospital release  before you go home.  Activity: As tolerated with Full fall precautions use walker/cane & assistance as needed  For Heart failure patients - Check your Weight same time everyday, if you gain over 2 pounds, or you develop in leg swelling, experience more shortness of breath or chest  pain, call your Primary MD immediately. Follow Cardiac Low Salt Diet and 1.5 lit/day fluid restriction.  If you have smoked or chewed Tobacco in the last 2 yrs please stop smoking, stop any regular Alcohol  and or any Recreational drug use.  If you experience worsening of your admission symptoms, develop shortness of breath, life threatening emergency, suicidal or homicidal thoughts you must seek medical attention immediately by calling 911 or calling your MD immediately  if symptoms less severe.  You Must read complete instructions/literature along with all the possible adverse reactions/side effects for all the Medicines you take and that have been prescribed to you. Take any new Medicines after you have completely understood and accpet all the possible adverse reactions/side effects.   Do not drive, operate heavy machinery, perform activities at heights, swimming or participation in water activities or provide baby sitting services if your were admitted for syncope or siezures until you have seen by Primary MD or a Neurologist and advised to do so again.  Do not drive when taking Pain medications.  Do not take more than prescribed Pain, Sleep and Anxiety Medications  Wear Seat belts while driving.   Please note You were cared for by a hospitalist during your hospital stay. If you have any questions about your discharge medications or the care you received while you were in the hospital after you are discharged, you can call the unit and asked to speak with the hospitalist on call if the hospitalist that took care of you is not available. Once you are discharged, your primary care physician will handle any further medical issues. Please note that NO REFILLS for any discharge medications will be authorized once you are discharged, as it is imperative that you return to your primary care physician (or establish a relationship with a primary care physician if you do not have one) for your aftercare  needs so that they can reassess your need for medications and monitor your lab values.    Follow ups:    Follow-up Information     [place] [place] Follow up.   Specialty: Home Health Services Why: PT, RN Contact information: [place] [address] [phone]         Glendale Chard, MD Follow up.   Specialty: Internal Medicine Contact information: [address] [place] [address] [phone]         Elouise Munroe, MD .   Specialties: Cardiology, Radiology Contact information: [address] [place] [address] [phone]                 Wound care:   Incision - 6 Ports Abdomen Right;Lateral Right;Medial Left;Medial Left;Medial;Lateral Left;Lateral Mid;Upper (Active)  Placement Date/Time: 08/26/16 1451   Location of Ports: Abdomen  Location Orientation: Right;Lateral  Location Orientation: Right;Medial  Location Orientation: Left;Medial  Location Orientation: Left;Medial;Lateral  Location Orientation: Left;Lateral ...    Assessments 08/26/2016  4:18 PM 08/30/2016  8:15 AM  Port 1 Site Assessment -- [employer] 1 Margins -- Attached edges (approximated)  Port 1 Drainage Amount -- None  Port 1 Dressing Type Liquid skin adhesive Liquid skin adhesive  Port 1  Dressing Status -- Clean;Dry;Intact  Port 2 Site Assessment -- [employer] 2 Margins -- Attached edges (approximated)  Port 2 Drainage Amount -- None  Port 2 Dressing Type Liquid skin adhesive Liquid skin adhesive  Port 2 Dressing Status -- Clean;Dry;Intact  Port 3 Site Assessment -- Clean;Dry  Port 3 Margins -- Attached edges (approximated)  Port 3 Drainage Amount -- None  Port 3 Dressing Type Liquid skin adhesive Liquid skin adhesive  Port 3 Dressing Status -- Clean;Dry;Intact  Port 4 Site Assessment -- Clean;Dry  Port 4 Margins -- Attached edges (approximated)  Port 4 Drainage Amount -- None  Port 4 Dressing  Type -- Liquid skin adhesive  Port 4 Dressing Status -- Clean;Dry;Intact  Port 5 Site Assessment -- Clean;Dry  Port 5 Margins -- Attached edges (approximated)  Port 5 Drainage Amount -- None  Port 5 Dressing Type Liquid skin adhesive Liquid skin adhesive  Port 5 Dressing Status -- Clean;Dry;Intact  Port 6 Site Assessment -- Clean;Dry  Port 6 Margins -- Attached edges (approximated)  Port 6 Drainage Amount -- None  Port 6 Dressing Type Liquid skin adhesive Liquid skin adhesive  Port 6 Dressing Status -- Clean;Dry;Intact     No Linked orders to display     Incision (Closed) 09/26/20 Abdomen Other (Comment) (Active)  Date First Assessed/Time First Assessed: 09/26/20 0920   Location: Abdomen  Location Orientation: Other (Comment)    Assessments 09/26/2020 12:18 PM 11/06/2020  2:02 PM  Dressing Type Benzoine;Adhesive strips;Gauze (Comment);Negative pressure wound therapy Foam - Lift dressing to assess site every shift  Dressing Clean;Dry;Intact Clean;Dry;Intact  Dressing Change Frequency -- Twice a day  Site / Wound Assessment Dressing in place / Unable to assess Clean;Dry  Margins Attached edges (approximated) Attached edges (approximated)  Closure -- None  Drainage Amount None None  Drainage Description -- No odor     No Linked orders to display     Incision (Closed) 10/15/20 Abdomen (Active)  Date First Assessed/Time First Assessed: 10/15/20 1339   Location: Abdomen    Assessments 10/15/2020  2:45 PM 11/06/2020  2:02 PM  Dressing Type Negative pressure wound therapy Foam - Lift dressing to assess site every shift  Dressing -- Dry;Clean;Intact  Dressing Change Frequency -- Twice a day  Site / Wound Assessment -- Clean;Dry  Margins -- Unattached edges (unapproximated)  Closure -- None  Drainage Amount -- None  Drainage Description -- No odor     No Linked orders to display     Pressure Injury 10/21/20 Buttocks Right;Other (Comment) Stage 2 -  Partial thickness loss of dermis  presenting as a shallow open injury with a red, pink wound bed without slough. (Active)  Date First Assessed/Time First Assessed: 10/21/20 2100   Location: Buttocks  Location Orientation: (c) Right;Other (Comment)  Staging: Stage 2 -  Partial thickness loss of dermis presenting as a shallow open injury with a red, pink wound bed without s...    Assessments 10/21/2020  9:00 PM 11/06/2020  2:02 PM  Dressing Type Foam - Lift dressing to assess site every shift Foam - Lift dressing to assess site every shift  Dressing Clean;Dry;Intact Changed  Dressing Change Frequency PRN --  Site / Wound Assessment Clean;Dry --  % Wound base Red or Granulating 100% --  % Wound base Yellow/Fibrinous Exudate 0% --  Wound Length (cm) 1.2 cm --  Wound Width (cm) 1 cm --  Wound Surface Area (cm^2) 1.2 cm^2 --  Drainage Amount None --  No Linked orders to display     Pressure Injury 10/21/20 Sacrum Stage 2 -  Partial thickness loss of dermis presenting as a shallow open injury with a red, pink wound bed without slough. (Active)  Date First Assessed/Time First Assessed: 10/21/20 2100   Location: Sacrum  Staging: Stage 2 -  Partial thickness loss of dermis presenting as a shallow open injury with a red, pink wound bed without slough.    Assessments 10/21/2020  9:00 PM 11/06/2020  2:02 PM  Dressing Type Foam - Lift dressing to assess site every shift Foam - Lift dressing to assess site every shift  Dressing Dry;Clean;Intact Changed  Dressing Change Frequency Every 5 days --  Site / Wound Assessment Clean;Dry --  Wound Length (cm) 1 cm --  Wound Width (cm) 0.5 cm --  Wound Surface Area (cm^2) 0.5 cm^2 --     No Linked orders to display    Discharge Exam:   Vitals:   11/05/20 2115 11/06/20 0600 11/06/20 0634 11/06/20 1323  BP: (!) 141/61  117/69 127/65  Pulse: (!) 55  62 67  Resp: '18  16 17  '$ Temp: 98.8 F (37.1 C)  98.7 F (37.1 C) 99.3 F (37.4 C)  TempSrc: Oral  Oral Oral  SpO2: 97%  99% 99%  Weight:   89.4 kg      Body mass index is 32.78 kg/m.  General exam: Pleasant, elderly African-American female.  Not in physical distress Skin: No rashes, lesions or ulcers. HEENT: Atraumatic, normocephalic, no obvious bleeding Lungs: Clear to auscultation bilaterally CVS: Regular rate and rhythm, no murmur GI/Abd soft, surgical site tenderness.  Bowel sound present CNS: Alert, awake, oriented x3 Psychiatry: Mood appropriate Extremities: Pedal edema improved.  Improved tenderness on the outer aspect of the right malleolus improved as well.  Time coordinating discharge: 35 minutes   The results of significant diagnostics from this hospitalization (including imaging, microbiology, ancillary and laboratory) are listed below for reference.    Procedures and Diagnostic Studies:   No results found.   Labs:   Basic Metabolic Panel: Recent Labs  Lab 11/02/20 1524 11/03/20 0045 11/04/20 0436 11/05/20 0350 11/06/20 0344  NA 137 140 138 140 143  K 3.7 4.2 3.9 3.7 3.6  CL 101 105 106 108 109  CO2 '26 26 25 25 25  '$ GLUCOSE 174* 192* 105* 95 75  BUN '18 16 11 10 9  '$ CREATININE 1.52* 1.38* 1.02* 0.92 0.89  CALCIUM 9.1 9.3 8.5* 8.9 9.2   GFR Estimated Creatinine Clearance: 59.4 mL/min (by C-G formula based on SCr of 0.89 mg/dL). Liver Function Tests: Recent Labs  Lab 11/02/20 1524 11/03/20 0045  AST 17 16  ALT 11 9  ALKPHOS 65 62  BILITOT 0.7 0.5  PROT 6.4* 6.0*  ALBUMIN 2.6* 2.4*   No results for input(s): LIPASE, AMYLASE in the last 168 hours. No results for input(s): AMMONIA in the last 168 hours. Coagulation profile No results for input(s): INR, PROTIME in the last 168 hours.  CBC: Recent Labs  Lab 11/02/20 1524 11/03/20 0045 11/04/20 0436 11/05/20 0350 11/06/20 0344  WBC 7.2 6.5 5.1 5.1 5.1  NEUTROABS 3.3  --  2.2 2.0 2.3  HGB 9.1* 8.6* 7.6* 7.7* 7.6*  HCT 28.5* 25.7* 23.1* 23.4* 23.3*  MCV 87.2 84.8 84.9 84.5 83.8  PLT 244 222 202 203 211   Cardiac Enzymes: No  results for input(s): CKTOTAL, CKMB, CKMBINDEX, TROPONINI in the last 168 hours. BNP: Invalid input(s): POCBNP CBG: Recent Labs  Lab 11/05/20 1853 11/05/20 1915 11/05/20 2118 11/06/20 0735 11/06/20 1107  GLUCAP 63* 104* 101* 88 141*   D-Dimer No results for input(s): DDIMER in the last 72 hours. Hgb A1c No results for input(s): HGBA1C in the last 72 hours. Lipid Profile No results for input(s): CHOL, HDL, LDLCALC, TRIG, CHOLHDL, LDLDIRECT in the last 72 hours. Thyroid function studies No results for input(s): TSH, T4TOTAL, T3FREE, THYROIDAB in the last 72 hours.  Invalid input(s): FREET3 Anemia work up Recent Labs    11/04/20 0436  VITAMINB12 2,361*  FOLATE 18.8  FERRITIN 214  TIBC 134*  IRON 15*  RETICCTPCT 1.3   Microbiology Recent Results (from the past 240 hour(s))  Culture, blood (Routine X 2) w Reflex to ID Panel     Status: None (Preliminary result)   Collection Time: 11/02/20  9:48 PM   Specimen: BLOOD  Result Value Ref Range Status   Specimen Description   Final    BLOOD LEFT ANTECUBITAL Performed at [facility], [place] [address] [place], [place] [address]    Special Requests   Final    BOTTLES DRAWN AEROBIC ONLY Blood Culture adequate volume Performed at [facility] [address], [place], [place] [address]    Culture   Final    NO GROWTH 4 DAYS Performed at [facility], [place] [address], [place], [place] [address]    Report Status PENDING  Incomplete  Resp Panel by RT-PCR (Flu A&B, Covid) Nasopharyngeal Swab     Status: None   Collection Time: 11/02/20  9:48 PM   Specimen: Nasopharyngeal Swab; Nasopharyngeal(NP) swabs in vial transport medium  Result Value Ref Range Status   SARS Coronavirus 2 by RT PCR NEGATIVE NEGATIVE Final    Comment: (NOTE) SARS-CoV-2 target nucleic acids are NOT DETECTED.  The SARS-CoV-2 RNA is generally detectable in upper respiratory specimens during the acute phase of  infection. The lowest concentration of SARS-CoV-2 viral copies this assay can detect is 138 copies/mL. A negative result does not preclude SARS-Cov-2 infection and should not be used as the sole basis for treatment or other patient management decisions. A negative result may occur with  improper specimen collection/handling, submission of specimen other than nasopharyngeal swab, presence of viral mutation(s) within the areas targeted by this assay, and inadequate number of viral copies(<138 copies/mL). A negative result must be combined with clinical observations, patient history, and epidemiological information. The expected result is Negative.  Fact Sheet for Patients:  EntrepreneurPulse.com.au  Fact Sheet for Healthcare Providers:  IncredibleEmployment.be  This test is no t yet approved or cleared by the [place] FDA and  has been authorized for detection and/or diagnosis of SARS-CoV-2 by FDA under an Emergency Use Authorization (EUA). This EUA will remain  in effect (meaning this test can be used) for the duration of the COVID-19 declaration under Section 564(b)(1) of the Act, 21 U.S.C.section 360bbb-3(b)(1), unless the authorization is terminated  or revoked sooner.       Influenza A by PCR NEGATIVE NEGATIVE Final   Influenza B by PCR NEGATIVE NEGATIVE Final    Comment: (NOTE) The Xpert Xpress SARS-CoV-2/FLU/RSV plus assay is intended as an aid in the diagnosis of influenza from Nasopharyngeal swab specimens and should not be used as a sole basis for treatment. Nasal washings and aspirates are unacceptable for Xpert Xpress SARS-CoV-2/FLU/RSV testing.  Fact Sheet for Patients: EntrepreneurPulse.com.au  Fact Sheet for Healthcare Providers: IncredibleEmployment.be  This test is not yet approved or cleared  by the [place] and has been authorized for detection and/or diagnosis of SARS-CoV-2  by FDA under an Emergency Use Authorization (EUA). This EUA will remain in effect (meaning this test can be used) for the duration of the COVID-19 declaration under Section 564(b)(1) of the Act, 21 U.S.C. section 360bbb-3(b)(1), unless the authorization is terminated or revoked.  Performed at [facility], [place] [address] [place], [place] [address]   Culture, blood (Routine X 2) w Reflex to ID Panel     Status: None (Preliminary result)   Collection Time: 11/03/20 12:41 AM   Specimen: BLOOD  Result Value Ref Range Status   Specimen Description   Final    BLOOD RIGHT ANTECUBITAL Performed at [facility] [address] [place], [place] [address]    Special Requests   Final    BOTTLES DRAWN AEROBIC ONLY Blood Culture adequate volume Performed at [facility] [address], [place], [place] [address]    Culture   Final    NO GROWTH 3 DAYS Performed at [facility], [place] [address], [place], [place] [address]    Report Status PENDING  Incomplete     Signed: Marlowe Aschoff Jax Kentner  [facility] 11/06/2020, 4:00 PM

## 2020-11-06 NOTE — Progress Notes (Signed)
Physical Therapy Treatment Patient Details Name: Teresa Golden MRN: [id number] DOB: 1945-04-18 Today's Date: 11/06/2020   History of Present Illness 75 y.o. female admitted on 11/02/20 for infected abdominal wound.  Plastic surgery following, IV antibiotics initiated.   Pt with significant PMH of panniculectomy 09/26/20 (Dr. Marla Roe), August 22 I and D and wound vac and was d/c to SNF.  Pt with other significant hx of DM, essential HTN, CKD III, Hep C, hypothyroid, morbid obestiy, scoliosis, gout, L TKA, R knee arthroscopy.    PT Comments    Pt tolerated increased ambulation distance of 45' today. Ambulation distance limited by generalized fatigue.  She stated gout pain in her foot is relieved.  Pt plans to DC home, where she states her friends can assist as needed. She declined SNF due to poor experience there recently ("they didn't know how to manage my wound VAC and I ended up in the ER 3 days in a row").    Recommendations for follow up therapy are one component of a multi-disciplinary discharge planning process, led by the attending physician.  Recommendations may be updated based on patient status, additional functional criteria and insurance authorization.  Follow Up Recommendations  Home health PT     Equipment Recommendations  Other (comment) (repair of rollator brakes)    Recommendations for Other Services       Precautions / Restrictions Precautions Precautions: Fall Precaution Comments: R ankle pain/gout, abdominal wounds Restrictions Weight Bearing Restrictions: No     Mobility  Bed Mobility               General bed mobility comments: up at edge of bed    Transfers Overall transfer level: Needs assistance Equipment used: 4-wheeled walker Transfers: Sit to/from Stand Sit to Stand: Min guard         General transfer comment: min/guard for safety, steadied rollator as brakes don't work  Ambulation/Gait Ambulation/Gait assistance: Catering manager (Feet): 45 Feet Assistive device: 4-wheeled walker Gait Pattern/deviations: Step-through pattern;Decreased stride length;Trunk flexed Gait velocity: decreased   General Gait Details: pt leaned onto RW with forearms during last 1/2 of walk, VCs to hold grips with hands, distance limited by fatigue   Stairs             Wheelchair Mobility    Modified Rankin (Stroke Patients Only)       Balance Overall balance assessment: Needs assistance Sitting-balance support: Feet supported;No upper extremity supported Sitting balance-Leahy Scale: Fair     Standing balance support: Single extremity supported Standing balance-Leahy Scale: Fair Standing balance comment: patient was noted to have moments of good standing balance then with fatigue requires more assistance.                            Cognition Arousal/Alertness: Awake/alert Behavior During Therapy: WFL for tasks assessed/performed Overall Cognitive Status: Within Functional Limits for tasks assessed                                        Exercises      General Comments        Pertinent Vitals/Pain Pain Score: 4  Pain Location: abdominal wound Pain Descriptors / Indicators: Sore;Tender Pain Intervention(s): Limited activity within patient's tolerance;Monitored during session;Premedicated before session    Home Living  Prior Function            PT Goals (current goals can now be found in the care plan section) Acute Rehab PT Goals Patient Stated Goal: patient wants to go back home. PT Goal Formulation: With patient Time For Goal Achievement: 11/18/20 Potential to Achieve Goals: Good Progress towards PT goals: Progressing toward goals    Frequency    Min 3X/week      PT Plan Current plan remains appropriate    Co-evaluation              AM-PAC PT "6 Clicks" Mobility   Outcome Measure  Help needed turning from your back to  your side while in a flat bed without using bedrails?: None Help needed moving from lying on your back to sitting on the side of a flat bed without using bedrails?: A Little Help needed moving to and from a bed to a chair (including a wheelchair)?: A Little Help needed standing up from a chair using your arms (e.g., wheelchair or bedside chair)?: A Little Help needed to walk in hospital room?: A Little Help needed climbing 3-5 steps with a railing? : A Lot 6 Click Score: 18    End of Session Equipment Utilized During Treatment: Gait belt Activity Tolerance: Patient limited by fatigue Patient left: in bed;with call bell/phone within reach;with nursing/sitter in room Nurse Communication: Mobility status PT Visit Diagnosis: Muscle weakness (generalized) (M62.81);Difficulty in walking, not elsewhere classified (R26.2);Pain     Time: [id number] PT Time Calculation (min) (ACUTE ONLY): 17 min  Charges:  $Gait Training: 8-22 mins                    Blondell Reveal Kistler PT 11/06/2020  Acute Rehabilitation Services Pager [phone] Office [phone]

## 2020-11-06 NOTE — TOC Transition Note (Signed)
Transition of Care [facility]) - CM/SW Discharge Note  Patient Details  Name: Teresa Golden MRN: [id number] Date of Birth: 1945-04-08  Transition of Care [facility]) CM/SW Contact:  Sherie Don, LCSW Phone Number: 11/06/2020, 3:54 PM  Clinical Narrative: CSW followed up with Baxter Flattery at [facility] to make a [facility] referral. Per Baxter Flattery, paperwork would need to be submitted to a state agency and if the patient is approved, the referral would come back to [facility] and then the agency would send someone out to assess the patient to determine if she would be admitted. Baxter Flattery was unable to give CSW a firm timeline or acceptance of the patient, so the hospitalist and plastics PA were updated and it was requested that CSW refer patient to another [facility] agency.  CSW spoke with patient and explained patient would need to be referred to another agency. Patient is agreeable. Patient referred to [place] with [facility]. HH orders are in. TOC signing off.  Final next level of care: [place] Barriers to Discharge: Barriers Resolved  Patient Goals and CMS Choice Patient states their goals for this hospitalization and ongoing recovery are:: Discharge home with [facility] CMS Medicare.gov Compare Post Acute Care list provided to:: Patient Choice offered to / list presented to : Patient  Discharge Plan and Services In-house Referral: Clinical Social Work        DME Arranged: N/A DME Agency: NA HH Arranged: PT, RN [place] Agency: [place] Date [place] Agency Contacted: 11/06/20 Time HH Agency Contacted: 1410 Representative spoke with at [place]: Angie  Readmission Risk Interventions Readmission Risk Prevention Plan 11/05/2020  [place] or Home Care Consult Complete  Social Work Consult for [place] Planning/Counseling Complete  Palliative Care Screening Not Applicable  Some recent data might be hidden

## 2020-11-06 NOTE — Progress Notes (Signed)
PROGRESS NOTE  Teresa Golden  DOB: 04/19/1945  PCP: Glendale Chard, MD [id number]  DOA: 11/02/2020  LOS: 4 days  Hospital Day: 5   Chief Complaint  Patient presents with   Wound Check    Brief narrative: Teresa Golden is a 75 y.o. female with PMH significant for DM2, HTN, CKD 3, GERD, hep C, hypothyroidism, obesity, scoliosis who had panniculectomy and supraumbilical hernia repair performed on August 3 by plastic surgeon Dr. Marla Roe.   She was seen as an outpatient by plastic surgery on 8/19 for fever, chills, wound infection.  She was hospitalized, underwent irrigation and debridement with wound VAC placement on 8/22.  She was discharged to [facility] skilled nursing facility on 8/29.   On 9/9, patient was sent from the facility with fever, concern of surgical site infection and patient's noncompliance to wound VAC.  In the ED, patient had a temperature of 99, hemodynamically stable Labs with WBC count normal, lactic acid level normal, creatinine elevated 1.52 ED physician discussed the case with plastic surgeon.  Recommended admission for IV antibiotics. Admitted to hospitalist service Plastic surgery following.  Subjective: Patient was seen and examined this morning.  Lying in bed.  Not in distress.  Ankle pain improving.  Patient states she does not want to go to any SNF and is strongly considering to go home.  Assessment/Plan: Surgical site infection -Currently IV vancomycin and IV cefepime.  Currently no systemic symptoms and signs of infection.   -No need of surgical intervention at this time per plastic surgery.  Recommended Keflex 5 mg 3 times daily for next 5 days and wet-to-dry dressing 3 times a week. -TOC looking for home health care agency.  AKI on CKD stage IIIa -Baseline creatinine 1.1 on 9/5.  Presented with creatinine elevated to 1.52.  Improved back to baseline with IV fluid.  Encourage oral hydration.  Acute gout flareup History of gout -On 9/10,  patient had mild to moderate tenderness present on the lateral aspect right malleolus.  No evidence of cellulitis.  Pain slightly worse on active and passive movement of the ankle. -Patient has history of gout and is on as needed colchicine at home.  Colchicine, Neurontin and as needed Norco were resumed.  Pain significantly improving.  Chronic anemia -Hemoglobin level between 8 and 9, at baseline.  Currently running slightly low between 7 and 8.  No evidence of bleeding. -Iron panel showed adequate level of ferritin. Recent Labs    05/31/20 1041 09/18/20 0856 11/02/20 1524 11/03/20 0045 11/04/20 0436 11/05/20 0350 11/06/20 0344  HGB 11.4   < > 9.1* 8.6* 7.6* 7.7* 7.6*  MCV 82   < > 87.2 84.8 84.9 84.5 83.8  VITAMINB12 >2000*  --   --   --  2,361*  --   --   FOLATE  --   --   --   --  18.8  --   --   FERRITIN 209*  --   --   --  214  --   --   TIBC 302  --   --   --  134*  --   --   IRON 94  --   --   --  15*  --   --   RETICCTPCT  --   --   --   --  1.3  --   --    < > = values in this interval not displayed.    Type 2 diabetes mellitus -A1c 6.4  on 09/18/2020 -Home meds include Semglee 9 units at bedtime, Trulicity [id number] mg every Saturday. -Currently on Semglee 9 units nightly along with sliding scale insulin with Accu-Cheks. -I noted that patient's blood sugar level has been running low and was 63 at the lowest last night. -I would reduce the dose of Semglee to 5 units nightly. -Trulicity on hold.  Blood sugar level acceptable. Recent Labs  Lab 11/05/20 1636 11/05/20 1853 11/05/20 1915 11/05/20 2118 11/06/20 0735  GLUCAP 142* 63* 104* 101* 88    Essential hypertension -Home meds include Toprol 25 mg daily, amlodipine 5 mg daily, losartan 100 mg daily -Currently blood pressure is stable on Toprol and amlodipine.  Losartan is on hold.    Hyperlipidemia -Continue simvastatin  History of hepatitis C, liver cirrhosis -On Toprol.  Not on diuretics or lactulose. -Liver  enzymes normal.  Hypothyroidism -Continue Synthroid  Mobility: PT eval obtained Code Status:   Code Status: Full Code  Nutritional status: Body mass index is 32.78 kg/m.     Diet: Does not want carb controlled diet. Diet Order             Diet regular Room service appropriate? Yes; Fluid consistency: Thin  Diet effective now                  DVT prophylaxis:  enoxaparin (LOVENOX) injection 40 mg Start: 11/03/20 1000   Antimicrobials: Switch to oral Keflex today. Fluid: Not on IV fluid Consultants: Plastic surgery Family Communication: None at bedside  Status is: Inpatient  Remains inpatient appropriate because: Pending home health care needs arrangement  Dispo: The patient is from: SNF              Anticipated d/c is to: Does not want to return back to [place].  She wants to go home with home health services.              Patient currently is medically stable to d/c.   Difficult to place patient No     Infusions:   sodium chloride 20 mL/hr at 11/02/20 2320   ceFEPime (MAXIPIME) IV 2 g (11/06/20 0910)   vancomycin 1,000 mg (11/05/20 1507)    Scheduled Meds:  amLODipine  5 mg Oral Daily   colchicine  0.6 mg Oral Daily   enoxaparin (LOVENOX) injection  40 mg Subcutaneous Q24H   gabapentin  600 mg Oral QHS   insulin aspart  0-20 Units Subcutaneous TID WC   insulin aspart  0-5 Units Subcutaneous QHS   insulin glargine-yfgn  5 Units Subcutaneous QHS   levothyroxine  112 mcg Oral QAC breakfast   metoprolol succinate  25 mg Oral QHS   simvastatin  10 mg Oral QHS    Antimicrobials: Anti-infectives (From admission, onward)    Start     Dose/Rate Route Frequency Ordered Stop   11/05/20 1530  vancomycin (VANCOCIN) IVPB 1000 mg/200 mL premix        1,000 mg 200 mL/hr over 60 Minutes Intravenous Every 24 hours 11/05/20 1429     11/04/20 1000  vancomycin (VANCOCIN) IVPB 1000 mg/200 mL premix  Status:  Discontinued        1,000 mg 200 mL/hr over 60 Minutes  Intravenous Every 36 hours 11/03/20 0027 11/05/20 1429   11/03/20 1000  ceFEPIme (MAXIPIME) 2 g in sodium chloride 0.9 % 100 mL IVPB        2 g 200 mL/hr over 30 Minutes Intravenous Every 12 hours 11/03/20 0027  11/02/20 2215  ceFEPIme (MAXIPIME) 2 g in sodium chloride 0.9 % 100 mL IVPB        2 g 200 mL/hr over 30 Minutes Intravenous  Once 11/02/20 2213 11/02/20 2349   11/02/20 2200  vancomycin (VANCOCIN) IVPB 1000 mg/200 mL premix        1,000 mg 200 mL/hr over 60 Minutes Intravenous  Once 11/02/20 2147 11/02/20 2312       PRN meds: acetaminophen **OR** acetaminophen, diclofenac Sodium, ondansetron **OR** ondansetron (ZOFRAN) IV, oxyCODONE-acetaminophen   Objective: Vitals:   11/05/20 2115 11/06/20 0634  BP: (!) 141/61 117/69  Pulse: (!) 55 62  Resp: 18 16  Temp: 98.8 F (37.1 C) 98.7 F (37.1 C)  SpO2: 97% 99%    Intake/Output Summary (Last 24 hours) at 11/06/2020 1047 Last data filed at 11/06/2020 0910 Gross per 24 hour  Intake 1223.48 ml  Output 3 ml  Net 1220.48 ml    Filed Weights   11/06/20 0600  Weight: 89.4 kg   Weight change:  Body mass index is 32.78 kg/m.   Physical Exam: General exam: Pleasant, elderly African-American female.  Not in physical distress Skin: No rashes, lesions or ulcers. HEENT: Atraumatic, normocephalic, no obvious bleeding Lungs: Clear to auscultation bilaterally CVS: Regular rate and rhythm, no murmur GI/Abd soft, surgical site tenderness.  Bowel sound present CNS: Alert, awake, oriented x3 Psychiatry: Mood appropriate Extremities: Pedal edema improved.  Mild tenderness on the outer aspect of the right malleolus improved as well.  Data Review: I have personally reviewed the laboratory data and studies available.  Recent Labs  Lab 11/02/20 1524 11/03/20 0045 11/04/20 0436 11/05/20 0350 11/06/20 0344  WBC 7.2 6.5 5.1 5.1 5.1  NEUTROABS 3.3  --  2.2 2.0 2.3  HGB 9.1* 8.6* 7.6* 7.7* 7.6*  HCT 28.5* 25.7* 23.1* 23.4*  23.3*  MCV 87.2 84.8 84.9 84.5 83.8  PLT 244 222 202 203 211    Recent Labs  Lab 11/02/20 1524 11/03/20 0045 11/04/20 0436 11/05/20 0350 11/06/20 0344  NA 137 140 138 140 143  K 3.7 4.2 3.9 3.7 3.6  CL 101 105 106 108 109  CO2 '26 26 25 25 25  '$ GLUCOSE 174* 192* 105* 95 75  BUN '18 16 11 10 9  '$ CREATININE 1.52* 1.38* 1.02* 0.92 0.89  CALCIUM 9.1 9.3 8.5* 8.9 9.2     F/u labs ordered Unresulted Labs (From admission, onward)     Start     Ordered   11/09/20 0500  Creatinine, serum  (enoxaparin (LOVENOX)    CrCl >/= 30 ml/min)  Weekly,   R     Comments: while on enoxaparin therapy    11/03/20 0037            Signed, Terrilee Croak, MD [facility] 11/06/2020

## 2020-11-07 ENCOUNTER — Telehealth: Payer: Self-pay | Admitting: Plastic Surgery

## 2020-11-07 ENCOUNTER — Other Ambulatory Visit (HOSPITAL_COMMUNITY): Payer: Self-pay

## 2020-11-07 ENCOUNTER — Telehealth: Payer: Self-pay | Admitting: *Deleted

## 2020-11-07 LAB — CULTURE, BLOOD (ROUTINE X 2)
Culture: NO GROWTH
Special Requests: ADEQUATE

## 2020-11-07 LAB — GLUCOSE, CAPILLARY: Glucose-Capillary: 121 mg/dL — ABNORMAL HIGH (ref 70–99)

## 2020-11-07 NOTE — Progress Notes (Signed)
Occupational Therapy Treatment Patient Details Name: Teresa Golden MRN: [id number] DOB: 1945/08/10 Today's Date: 11/07/2020   History of present illness 75 y.o. female admitted on 11/02/20 for infected abdominal wound.  Plastic surgery following, IV antibiotics initiated.   Pt with significant PMH of panniculectomy 09/26/20 (Dr. Marla Roe), August 22 I and D and wound vac and was d/c to SNF.  Pt with other significant hx of DM, essential HTN, CKD III, Hep C, hypothyroid, morbid obestiy, scoliosis, gout, L TKA, R knee arthroscopy.   OT comments  Upon arrival patient quite anxious, keeps asking about discharge paperwork "I need to go home!" Patient agreeable to OT for UB/LB dressing in preparation for D/C. Patient able to perform upper body dressing without assistance and min A for lower body dressing in order to pull clothes over R side/abdominal dressings. Patient overall supervision to power up to standing from bed 3 times. Supervision for safety as rollator brakes loose, educate patient will need to be tightened and verbalized understanding.    Recommendations for follow up therapy are one component of a multi-disciplinary discharge planning process, led by the attending physician.  Recommendations may be updated based on patient status, additional functional criteria and insurance authorization.    Follow Up Recommendations  Home health OT          Precautions / Restrictions Precautions Precautions: Fall Precaution Comments: R ankle pain/gout, abdominal wounds       Mobility Bed Mobility Overal bed mobility: Modified Independent                  Transfers Overall transfer level: Needs assistance Equipment used: 4-wheeled walker Transfers: Sit to/from Stand Sit to Stand: Supervision         General transfer comment: S for safety/rollator unsafe with loose brakes    Balance Overall balance assessment: Needs assistance Sitting-balance support: Feet supported Sitting  balance-Leahy Scale: Good     Standing balance support: Single extremity supported Standing balance-Leahy Scale: Poor                             ADL either performed or assessed with clinical judgement   ADL Overall ADL's : Needs assistance/impaired                 Upper Body Dressing : Independent;Sitting   Lower Body Dressing: Minimal assistance;Sitting/lateral leans;Sit to/from stand Lower Body Dressing Details (indicate cue type and reason): able to thread B LEs into underwear and pants, needs assist to pull up clothes over R hip due to abdominal wound/dressing Toilet Transfer: Supervision/safety (rollator) Toilet Transfer Details (indicate cue type and reason): supervision with sit to stand from edge of bed 3 times, educate patient on getting 4WW brakes tighened as even in locked position rollator rolling         Functional mobility during ADLs: Supervision/safety (rollator)        Cognition Arousal/Alertness: Awake/alert Behavior During Therapy: Agitated;Anxious Overall Cognitive Status: Within Functional Limits for tasks assessed                                 General Comments: patient upset upon arrival, very anxious about discharging and keeps asking for her nurse "I need to go!"                   Pertinent Vitals/ Pain       Pain  Assessment: Faces Faces Pain Scale: Hurts a little bit Pain Location: abdominal wound Pain Descriptors / Indicators: Sore;Tender Pain Intervention(s): Monitored during session         Frequency  Min 2X/week        Progress Toward Goals  OT Goals(current goals can now be found in the care plan section)  Progress towards OT goals: Progressing toward goals  Acute Rehab OT Goals Patient Stated Goal: patient wants to go back home. OT Goal Formulation: With patient Time For Goal Achievement: 11/19/20 Potential to Achieve Goals: Good ADL Goals Pt Will Perform Lower Body Dressing: with  supervision;sit to/from stand Pt Will Perform Toileting - Clothing Manipulation and hygiene: with supervision;sit to/from stand Additional ADL Goal #1: Patient will stand at sink to perform grooming task as evidence of improving activity tolerance  Plan Frequency remains appropriate;Discharge plan needs to be updated       AM-PAC OT "6 Clicks" Daily Activity     Outcome Measure   Help from another person eating meals?: None Help from another person taking care of personal grooming?: None Help from another person toileting, which includes using toliet, bedpan, or urinal?: A Little Help from another person bathing (including washing, rinsing, drying)?: A Little Help from another person to put on and taking off regular upper body clothing?: A Little Help from another person to put on and taking off regular lower body clothing?: A Little 6 Click Score: 20    End of Session Equipment Utilized During Treatment: Other (comment) (rollator)  OT Visit Diagnosis: Unsteadiness on feet (R26.81);Other abnormalities of gait and mobility (R26.89);Pain;Muscle weakness (generalized) (M62.81) Pain - Right/Left: Right Pain - part of body: Ankle and joints of foot (abdomen)   Activity Tolerance Patient tolerated treatment well   Patient Left in bed;with call bell/phone within reach   Nurse Communication Other (comment) (patient asking about discharge paperwork)        Time: 1024-1040 OT Time Calculation (min): 16 min  Charges: OT General Charges $OT Visit: 1 Visit OT Treatments $Self Care/Home Management : 8-22 mins  Delbert Phenix OT OT pager: [phone]  Rosemary Holms 11/07/2020, 10:49 AM

## 2020-11-07 NOTE — Telephone Encounter (Signed)
Called and spoke with the patient regarding her referral to [facility].  Patient stated that she received a call from the nurse from [facility] to let her know that she will be receiving another call from another nurse on tomorrow to schedule an appointment for him to come out to for a visit.    Patient was so glad that she was able to get [facility] to come and help her with dressing changes.//AB/CMA

## 2020-11-08 ENCOUNTER — Ambulatory Visit: Payer: Medicare HMO | Admitting: Addiction (Substance Use Disorder)

## 2020-11-08 LAB — CULTURE, BLOOD (ROUTINE X 2)
Culture: NO GROWTH
Special Requests: ADEQUATE

## 2020-11-09 ENCOUNTER — Telehealth: Payer: Self-pay

## 2020-11-09 ENCOUNTER — Ambulatory Visit: Payer: Medicare HMO

## 2020-11-09 DIAGNOSIS — I129 Hypertensive chronic kidney disease with stage 1 through stage 4 chronic kidney disease, or unspecified chronic kidney disease: Secondary | ICD-10-CM

## 2020-11-09 DIAGNOSIS — N183 Chronic kidney disease, stage 3 unspecified: Secondary | ICD-10-CM

## 2020-11-09 DIAGNOSIS — T8149XA Infection following a procedure, other surgical site, initial encounter: Secondary | ICD-10-CM

## 2020-11-09 DIAGNOSIS — Z794 Long term (current) use of insulin: Secondary | ICD-10-CM

## 2020-11-09 DIAGNOSIS — N182 Chronic kidney disease, stage 2 (mild): Secondary | ICD-10-CM

## 2020-11-09 DIAGNOSIS — E1122 Type 2 diabetes mellitus with diabetic chronic kidney disease: Secondary | ICD-10-CM

## 2020-11-09 NOTE — Telephone Encounter (Signed)
Vicente Males called from [facility] to say that they received a referral for the patient to start home care services and wound care treatment.  Vicente Males said she is calling for Krista Blue, PA-C, to clarify and talk about wound care orders for patient.  Please call.

## 2020-11-09 NOTE — Patient Instructions (Signed)
Social Worker Visit Information  Goals we discussed today:   Goals Addressed             This Visit's Progress    Mobility and Independence Optimized       Timeframe:  Short-Term Goal Priority:  High Start Date:  9.16.22                           Expected End Date:    10.31.22                   Next planned outreach: 11/12/20  Patient Goals/Self-Care Activities patient will:   - Engage with home health -Attend 9/20 appointment -Contact SW as needed         Materials Provided: Verbal education about [employer] program provided by phone  Patient verbalizes understanding of instructions provided today and agrees to view in [place].   Follow Up Plan: Appointment scheduled for SW follow up with client by phone on: 11/12/20   Daneen Schick, [place], CDP Social Worker, Certified Dementia Practitioner [place] [facility] [place] [facility] [phone]

## 2020-11-09 NOTE — Chronic Care Management (AMB) (Signed)
Chronic Care Management    Social Work Note  11/09/2020 Name: Teresa Golden MRN: [id number] DOB: 1945-08-21  Teresa Golden is a 75 y.o. year old female who is a primary care patient of Glendale Chard, MD. The CCM team was consulted to assist the patient with chronic disease management and/or care coordination needs related to:  DM II, HTN, and CKD III .   Engaged with patient by telephone for follow up visit in response to provider referral for social work chronic care management and care coordination services.   Consent to Services:  The patient was given information about Chronic Care Management services, agreed to services, and gave verbal consent prior to initiation of services.  Please see initial visit note for detailed documentation.   Patient agreed to services and consent obtained.   Assessment: Review of patient past medical history, allergies, medications, and health status, including review of relevant consultants reports was performed today as part of a comprehensive evaluation and provision of chronic care management and care coordination services.     SDOH (Social Determinants of Health) assessments and interventions performed:    Advanced Directives Status: Not addressed in this encounter.  CCM Care Plan  Allergies  Allergen Reactions   Meloxicam Other (See Comments)    Fever; muscle aches; "flu-like" symptoms "Allergic," per [facility]   Other Other (See Comments)    "Rose fever and hay fever"    Outpatient Encounter Medications as of 11/09/2020  Medication Sig Note   acetaminophen (TYLENOL) 325 MG tablet Take 2 tablets (650 mg total) by mouth every 6 (six) hours as needed for moderate pain or mild pain.    amLODipine (NORVASC) 5 MG tablet Take 1 tablet (5 mg total) by mouth in the morning.    B Complex-C (B-COMPLEX WITH VITAMIN C) tablet Take 1 tablet by mouth daily.    Biotin 10000 MCG TABS Take 10,000 mcg by mouth daily.    cephALEXin (KEFLEX) 500 MG capsule Take 1  capsule (500 mg total) by mouth every 8 (eight) hours for 5 days.    Cholecalciferol 5000 units TABS Take 5,000 Units by mouth daily.    colchicine 0.6 MG tablet Take 1 tablet (0.6 mg total) by mouth daily for 7 days.    Cranberry-Vitamin C 250-60 MG CAPS Take 1 capsule by mouth daily.    diclofenac Sodium (VOLTAREN) 1 % GEL APPLY 2 GRAMS TO AFFECTED AREA(S) 4 TIMES DAILY AS NEEDED (Patient taking differently: Apply 2-4 g topically every 6 (six) hours as needed (to affected areas).)    Dulaglutide (TRULICITY) A999333 0000000 SOPN Inject into the skin every Wednesday. 11/02/2020: MAR left the dosage as "1 dose"- not sure why??   gabapentin (NEURONTIN) 300 MG capsule Take 600 mg by mouth at bedtime.    glucose blood (ONETOUCH VERIO) test strip Use as instructed to check blood sugars 2 times per day dx: e11.65    metoprolol succinate (TOPROL-XL) 25 MG 24 hr tablet Take 1 tablet by mouth daily at bedtime    Multiple Vitamins-Minerals (MULTIVITAMIN WITH MINERALS) tablet Take 1 tablet by mouth daily. Women's [id number]    OneTouch Delica Lancets 99991111 MISC Use as instructed to check blood sugars 2 times per day dx: e11.65    oxyCODONE-acetaminophen (PERCOCET/ROXICET) 5-325 MG tablet Take 1 tablet by mouth every 6 (six) hours as needed for up to 5 days for severe pain.    polyethylene glycol (MIRALAX / GLYCOLAX) 17 g packet Take 17 g by mouth daily.  saccharomyces boulardii (FLORASTOR) 250 MG capsule Take 1 capsule (250 mg total) by mouth 2 (two) times daily for 5 days.    insulin degludec (TRESIBA) 100 UNIT/ML FlexTouch Pen Inject 5 Units into the skin at bedtime.    simvastatin (ZOCOR) 10 MG tablet Take 1 tablet (10 mg total) by mouth daily. (Patient taking differently: Take 10 mg by mouth at bedtime.)    SYNTHROID 112 MCG tablet Take 112 mcg by mouth daily before breakfast.    [DISCONTINUED] SEMGLEE, YFGN, 100 UNIT/ML Pen Inject 9 Units into the skin at bedtime.    No facility-administered encounter medications  on file as of 11/09/2020.    Patient Active Problem List   Diagnosis Date Noted   Infected wound 11/02/2020   Pressure injury of skin 10/22/2020   Wound infection after surgery 10/12/2020   Normocytic anemia 10/12/2020   AKI (acute kidney injury) ([place]) 10/12/2020   Obesity (BMI 30-39.9) 10/12/2020   Sepsis ([place]) 10/12/2020   Type 2 diabetes mellitus with stage 3 chronic kidney disease, with long-term current use of insulin ([place]) 11/29/2019   Panniculitis 09/30/2019   Back pain 09/30/2019   Hepatoma ([place]) 01/21/2019   Other cirrhosis of liver ([place]) 07/05/2018   Hypertensive nephropathy 07/05/2018   Chronic renal disease, stage III ([place]) 07/05/2018   Chronic left shoulder pain 07/05/2018   Hypothyroidism 02/06/2018   Hepatitis C virus infection cured after antiviral drug therapy 02/04/2018   Osteoarthritis of right knee 07/30/2017   S/P laparoscopic sleeve gastrectomy July 2018 08/26/2016   Preop cardiovascular exam 07/30/2016   Dyspnea on exertion 07/30/2016   Bilateral lower extremity edema 07/30/2016    Conditions to be addressed/monitored: HTN, DMII, and CKD Stage III ; Transportation  Care Plan : Social Work Care Plan  Updates made by Daneen Schick since 11/09/2020 12:00 AM     Problem: Mobility and Independence      Goal: Mobility and Independence Optimized   Start Date: 11/09/2020  Expected End Date: 12/24/2020  Priority: High  Note:   Current Barriers:  Chronic disease management support and education needs related to HTN, DM, and CKD Stage III   Recent hospitalization due to wound infection following panniculectomy Transportation Social Worker Clinical Goal(s):  patient will work with SW to identify and address any acute and/or chronic care coordination needs related to the self health management of HTN, DM, and CKD Stage III   patient will work with SW to address concerns related to care coordination needs post hospitalization  SW Interventions:   Inter-disciplinary care team collaboration (see longitudinal plan of care) Collaboration with Glendale Chard, MD regarding development and update of comprehensive plan of care as evidenced by provider attestation and co-signature Successful outbound call placed to the patient in response to a voice message received Discussed the patient chose to discharge home rather than return to SNF following recent hospitalization Determined the patient is active with home health and has been seen by both RN and OT Patient reports she will receive bandage changes every other day from [facility] RN. [place] OT will plan to visit the home 2x week x 3 weeks and then 1x week x 3 weeks Discussed the patient no longer has a wound vac but instead has orders for wet to dry dressings Patient reports she has two friends that are coming to the home periodically to check on her and assist as needed Performed chart review to note [place] office visit with Allyn Surgeon to follow up on status of wound  healing on Tuesday September 20 at 10:00 am Patient requests assistance with transportation to this appointment Referral placed to [facility] Transportation program to request assistance with 9/20 appointment Provided verbal education to the patient on how this program works - patient is aware to expect a call from transportation advising of a pick up time Scheduled follow up call for September 19 to confirm patient has been in contact with transportation program as well as to assess how patient is doing in the home Collaboration with Dr. Baird Cancer as well as RN Care Manager to advise patient has returned home and is active with home health  Patient Goals/Self-Care Activities patient will:   -  Engage with home health -Attend 9/20 appointment -Contact SW as needed  Follow Up Plan: Telephone follow up appointment with care management team member scheduled for:11/12/20       Follow Up Plan: SW will follow up with the patient on  Monday 11/12/20.      Daneen Schick, BSW, CDP Social Worker, Certified Dementia Practitioner [place] [facility] [place] [facility] [phone]

## 2020-11-09 NOTE — Telephone Encounter (Signed)
Called and spoke with Teresa Golden with [facility], and informed her that I spoke with Dr. Marla Roe and she said it's okay to use the Aquacel Ag, and 3x/week for the wound care.  Teresa Golden verbalized understanding and agreed.//AB/CMA

## 2020-11-09 NOTE — Telephone Encounter (Signed)
Called and spoke with Vicente Males with [facility].  She stated that the orders for the patient is wet to dry dressing daily, but they do not have the staff to do wet to dry dressing daily.  So she's wanting to know if they could use Aquacel Ag.    If so they will need new orders for (2x/week or 3x/week).//AB/CMA

## 2020-11-12 ENCOUNTER — Ambulatory Visit: Payer: Medicare HMO

## 2020-11-12 ENCOUNTER — Telehealth: Payer: Self-pay

## 2020-11-12 ENCOUNTER — Telehealth: Payer: Self-pay | Admitting: Addiction (Substance Use Disorder)

## 2020-11-12 ENCOUNTER — Telehealth: Payer: Self-pay | Admitting: Plastic Surgery

## 2020-11-12 DIAGNOSIS — E1122 Type 2 diabetes mellitus with diabetic chronic kidney disease: Secondary | ICD-10-CM

## 2020-11-12 DIAGNOSIS — I129 Hypertensive chronic kidney disease with stage 1 through stage 4 chronic kidney disease, or unspecified chronic kidney disease: Secondary | ICD-10-CM

## 2020-11-12 DIAGNOSIS — N183 Chronic kidney disease, stage 3 unspecified: Secondary | ICD-10-CM

## 2020-11-12 DIAGNOSIS — Z794 Long term (current) use of insulin: Secondary | ICD-10-CM

## 2020-11-12 DIAGNOSIS — N182 Chronic kidney disease, stage 2 (mild): Secondary | ICD-10-CM

## 2020-11-12 NOTE — Patient Instructions (Signed)
Social Worker Visit Information  Goals we discussed today:   Goals Addressed             This Visit's Progress    Mobility and Independence Optimized   On track    Timeframe:  Short-Term Goal Priority:  High Start Date:  9.16.22                           Expected End Date:    10.31.22                   Next planned outreach: 11/19/20  Patient Goals/Self-Care Activities patient will:   - Engage with home health -Attend 9/20 appointment -Contact SW as needed         Patient verbalizes understanding of instructions provided today and agrees to view in [place].   Follow Up Plan: SW will follow up with patient by phone over the next week   Daneen Schick, BSW, CDP Social Worker, Certified Dementia Practitioner [place] [facility] [place] [facility] [phone]

## 2020-11-12 NOTE — Telephone Encounter (Signed)
Liji PT with [facility] called requesting verbal orders for PT for twice a week for 3 weeks then once a week for 3 weeks. [phone]  I returned her call and gave verbal orders okay. YL,RMA

## 2020-11-12 NOTE — Telephone Encounter (Signed)
Teresa Golden called because she said you show her as a missed call, but she said she never got a call.  Said she left a message about this last week as well.  She just wants to clear this up.  Please call.

## 2020-11-12 NOTE — Chronic Care Management (AMB) (Signed)
Chronic Care Management    Social Work Note  11/12/2020 Name: Teresa Golden MRN: [id number] DOB: 1945-08-23  Teresa Golden is a 76 y.o. year old female who is a primary care patient of Glendale Chard, MD. The CCM team was consulted to assist the patient with chronic disease management and/or care coordination needs related to:  DM II, CKD III, and HTN .   Engaged with patient by telephone for follow up visit in response to provider referral for social work chronic care management and care coordination services.   Consent to Services:  The patient was given information about Chronic Care Management services, agreed to services, and gave verbal consent prior to initiation of services.  Please see initial visit note for detailed documentation.   Patient agreed to services and consent obtained.   Assessment: Review of patient past medical history, allergies, medications, and health status, including review of relevant consultants reports was performed today as part of a comprehensive evaluation and provision of chronic care management and care coordination services.     SDOH (Social Determinants of Health) assessments and interventions performed:    Advanced Directives Status: Not addressed in this encounter.  CCM Care Plan  Allergies  Allergen Reactions   Meloxicam Other (See Comments)    Fever; muscle aches; "flu-like" symptoms "Allergic," per [facility]   Other Other (See Comments)    "Rose fever and hay fever"    Outpatient Encounter Medications as of 11/12/2020  Medication Sig Note   acetaminophen (TYLENOL) 325 MG tablet Take 2 tablets (650 mg total) by mouth every 6 (six) hours as needed for moderate pain or mild pain.    amLODipine (NORVASC) 5 MG tablet Take 1 tablet (5 mg total) by mouth in the morning.    B Complex-C (B-COMPLEX WITH VITAMIN C) tablet Take 1 tablet by mouth daily.    Biotin 10000 MCG TABS Take 10,000 mcg by mouth daily.    cephALEXin (KEFLEX) 500 MG capsule Take 1  capsule (500 mg total) by mouth every 8 (eight) hours for 5 days.    Cholecalciferol 5000 units TABS Take 5,000 Units by mouth daily.    colchicine 0.6 MG tablet Take 1 tablet (0.6 mg total) by mouth daily for 7 days.    Cranberry-Vitamin C 250-60 MG CAPS Take 1 capsule by mouth daily.    diclofenac Sodium (VOLTAREN) 1 % GEL APPLY 2 GRAMS TO AFFECTED AREA(S) 4 TIMES DAILY AS NEEDED (Patient taking differently: Apply 2-4 g topically every 6 (six) hours as needed (to affected areas).)    Dulaglutide (TRULICITY) A999333 0000000 SOPN Inject into the skin every Wednesday. 11/02/2020: MAR left the dosage as "1 dose"- not sure why??   gabapentin (NEURONTIN) 300 MG capsule Take 600 mg by mouth at bedtime.    glucose blood (ONETOUCH VERIO) test strip Use as instructed to check blood sugars 2 times per day dx: e11.65    metoprolol succinate (TOPROL-XL) 25 MG 24 hr tablet Take 1 tablet by mouth daily at bedtime    Multiple Vitamins-Minerals (MULTIVITAMIN WITH MINERALS) tablet Take 1 tablet by mouth daily. Women's [id number]    OneTouch Delica Lancets 99991111 MISC Use as instructed to check blood sugars 2 times per day dx: e11.65    oxyCODONE-acetaminophen (PERCOCET/ROXICET) 5-325 MG tablet Take 1 tablet by mouth every 6 (six) hours as needed for up to 5 days for severe pain.    polyethylene glycol (MIRALAX / GLYCOLAX) 17 g packet Take 17 g by mouth daily.  insulin degludec (TRESIBA) 100 UNIT/ML FlexTouch Pen Inject 5 Units into the skin at bedtime.    simvastatin (ZOCOR) 10 MG tablet Take 1 tablet (10 mg total) by mouth daily. (Patient taking differently: Take 10 mg by mouth at bedtime.)    SYNTHROID 112 MCG tablet Take 112 mcg by mouth daily before breakfast.    [DISCONTINUED] SEMGLEE, YFGN, 100 UNIT/ML Pen Inject 9 Units into the skin at bedtime.    No facility-administered encounter medications on file as of 11/12/2020.    Patient Active Problem List   Diagnosis Date Noted   Infected wound 11/02/2020   Pressure  injury of skin 10/22/2020   Wound infection after surgery 10/12/2020   Normocytic anemia 10/12/2020   AKI (acute kidney injury) ([place]) 10/12/2020   Obesity (BMI 30-39.9) 10/12/2020   Sepsis ([place]) 10/12/2020   Type 2 diabetes mellitus with stage 3 chronic kidney disease, with long-term current use of insulin ([place]) 11/29/2019   Panniculitis 09/30/2019   Back pain 09/30/2019   Hepatoma ([place]) 01/21/2019   Other cirrhosis of liver ([place]) 07/05/2018   Hypertensive nephropathy 07/05/2018   Chronic renal disease, stage III ([place]) 07/05/2018   Chronic left shoulder pain 07/05/2018   Hypothyroidism 02/06/2018   Hepatitis C virus infection cured after antiviral drug therapy 02/04/2018   Osteoarthritis of right knee 07/30/2017   S/P laparoscopic sleeve gastrectomy July 2018 08/26/2016   Preop cardiovascular exam 07/30/2016   Dyspnea on exertion 07/30/2016   Bilateral lower extremity edema 07/30/2016    Conditions to be addressed/monitored: HTN, DMII, and CKD Stage III ; Transportation  Care Plan : Social Work Care Plan  Updates made by Daneen Schick since 11/12/2020 12:00 AM     Problem: Mobility and Independence      Goal: Mobility and Independence Optimized   Start Date: 11/09/2020  Expected End Date: 12/24/2020  Priority: High  Note:   Current Barriers:  Chronic disease management support and education needs related to HTN, DM, and CKD Stage III   Recent hospitalization due to wound infection following panniculectomy Transportation Social Worker Clinical Goal(s):  patient will work with SW to identify and address any acute and/or chronic care coordination needs related to the self health management of HTN, DM, and CKD Stage III   patient will work with SW to address concerns related to care coordination needs post hospitalization  SW Interventions:  Inter-disciplinary care team collaboration (see longitudinal plan of care) Collaboration with Glendale Chard, MD regarding development  and update of comprehensive plan of care as evidenced by provider attestation and co-signature Successful outbound call placed to the patient to assess for care coordination needs Discussed the patient did receive a call from [place] on Friday September 16 to complete referral intake Patient reports she was instructed she would receive a call this afternoon confirming pick up time for Tuesday appointment Confirmed patient has transportation contact number and will plan to contact them directly if she does not hear from them regarding a pick up time later this afternoon Advised the patient to contact SW directly is needed Patient reports feeling anxious about tomorrows appointment and is hoping her wound is healing appropriately Scheduled follow up call over the next week  Patient Goals/Self-Care Activities patient will:   -  Engage with home health -Attend 9/20 appointment -Contact SW as needed  Follow Up Plan: Telephone follow up appointment with care management team member scheduled for:11/19/20       Follow Up Plan: SW will follow up with patient  by phone over the next week      Daneen Schick, BSW, CDP Social Worker, Certified Dementia Practitioner [place] [facility] [place] [facility] [phone]

## 2020-11-12 NOTE — Telephone Encounter (Signed)
Anna with [facility] is inquiring about clarification for frequency of orders. Requested to speak with Angie. Please call to advise [phone]. Thank you.

## 2020-11-12 NOTE — Progress Notes (Signed)
Patient is a 75 year old female with PMH of type II DM, HTN, CKD, and recent hospitalizations x2 for wound infection secondary to panniculectomy performed 09/26/2020 by Dr. Marla Roe.  She was discharged from the hospital most recently on 11/06/2020 after management with IV vancomycin and cefepime.  She was discharged home with continued Keflex and home health agency.  Plan was for wet-to-dry dressings 3 times weekly and has has been changed to Aquacel Ag 3 times weekly.  On my examination today, she states she is doing well.  Home health nurse comes 3 times weekly.  She is happy to be at home.  She denies any of the incisional site pain that she had experienced previously.  No recent fevers or chills.  Eating and drinking without difficulty.  Patient clinically looks well.  Her panniculectomy site remains open laterally, but no surrounding cellulitic changes.  No evidence of skin necrosis.  She has mild drainage, but not purulent or malodorous.  We will dressed with Xeroform and have her follow-up in 10 days for another appointment.  Recommended continued dressing changes 3 times weekly with Aquacel Ag.  She can call the clinic or come sooner if there is any questions or concerns.  Any pictures obtained of the patient and placed in the chart were with the patient's or guardian's permission.

## 2020-11-12 NOTE — Telephone Encounter (Signed)
Called and spoke with Vicente Males with [facility] regarding the message below.  She stated that she just wanted to clarify the wound care orders from Friday for the patient.    Informed Anna per Dr. Perfecto Kingdom it's okay to use the Aquacel Ag, and 3x/week for the wound care.  Vicente Males verbalized understanding and agreed.//AB/CMA

## 2020-11-13 ENCOUNTER — Ambulatory Visit (INDEPENDENT_AMBULATORY_CARE_PROVIDER_SITE_OTHER): Payer: Medicare HMO | Admitting: Physician Assistant

## 2020-11-13 ENCOUNTER — Other Ambulatory Visit: Payer: Self-pay

## 2020-11-13 DIAGNOSIS — T8149XA Infection following a procedure, other surgical site, initial encounter: Secondary | ICD-10-CM

## 2020-11-14 NOTE — Telephone Encounter (Signed)
A message for you

## 2020-11-14 NOTE — Telephone Encounter (Signed)
Pt called back and wanted to know why she didn't get a call back yet and if she was being charged a fee for the previous appt.

## 2020-11-16 ENCOUNTER — Telehealth: Payer: Self-pay

## 2020-11-16 NOTE — Telephone Encounter (Signed)
Called Anissa back, na, left detailed vm stating that she could continue using the Aqua Cell as we didn't have any here in the office when pt came and Xeroform was the next best thing. Adv her to continue with dressings as previously ordered.

## 2020-11-16 NOTE — Telephone Encounter (Signed)
Anissa called from [facility] to say that she would like to verify the wet to dry wound treatment.  She said that when she visited the patient, after the patient had been seen in our office, she had Xeroform on the wound.  Anissa would like to know if she should continue using Xeroform on patient's wound.  Please call.

## 2020-11-19 ENCOUNTER — Telehealth: Payer: Self-pay | Admitting: Plastic Surgery

## 2020-11-19 ENCOUNTER — Telehealth: Payer: Self-pay

## 2020-11-19 ENCOUNTER — Ambulatory Visit: Payer: Medicare HMO

## 2020-11-19 DIAGNOSIS — E1122 Type 2 diabetes mellitus with diabetic chronic kidney disease: Secondary | ICD-10-CM

## 2020-11-19 DIAGNOSIS — Z794 Long term (current) use of insulin: Secondary | ICD-10-CM

## 2020-11-19 DIAGNOSIS — I129 Hypertensive chronic kidney disease with stage 1 through stage 4 chronic kidney disease, or unspecified chronic kidney disease: Secondary | ICD-10-CM

## 2020-11-19 DIAGNOSIS — N183 Chronic kidney disease, stage 3 unspecified: Secondary | ICD-10-CM

## 2020-11-19 DIAGNOSIS — N182 Chronic kidney disease, stage 2 (mild): Secondary | ICD-10-CM

## 2020-11-19 NOTE — Chronic Care Management (AMB) (Signed)
Chronic Care Management    Social Work Note  11/19/2020 Name: Teresa Golden MRN: [id number] DOB: 1945/10/18  Teresa Golden is a 75 y.o. year old female who is a primary care patient of Glendale Chard, MD. The CCM team was consulted to assist the patient with chronic disease management and/or care coordination needs related to:  DM II, CKD III, and HTN .   Engaged with patient by telephone for follow up visit in response to provider referral for social work chronic care management and care coordination services.   Consent to Services:  The patient was given information about Chronic Care Management services, agreed to services, and gave verbal consent prior to initiation of services.  Please see initial visit note for detailed documentation.   Patient agreed to services and consent obtained.   Assessment: Review of patient past medical history, allergies, medications, and health status, including review of relevant consultants reports was performed today as part of a comprehensive evaluation and provision of chronic care management and care coordination services.     SDOH (Social Determinants of Health) assessments and interventions performed:    Advanced Directives Status: Not addressed in this encounter.  CCM Care Plan  Allergies  Allergen Reactions   Meloxicam Other (See Comments)    Fever; muscle aches; "flu-like" symptoms "Allergic," per [facility]   Other Other (See Comments)    "Rose fever and hay fever"    Outpatient Encounter Medications as of 11/19/2020  Medication Sig Note   acetaminophen (TYLENOL) 325 MG tablet Take 2 tablets (650 mg total) by mouth every 6 (six) hours as needed for moderate pain or mild pain.    amLODipine (NORVASC) 5 MG tablet Take 1 tablet (5 mg total) by mouth in the morning.    B Complex-C (B-COMPLEX WITH VITAMIN C) tablet Take 1 tablet by mouth daily.    Biotin 10000 MCG TABS Take 10,000 mcg by mouth daily.    Cholecalciferol 5000 units TABS Take 5,000  Units by mouth daily.    colchicine 0.6 MG tablet Take 1 tablet (0.6 mg total) by mouth daily for 7 days.    Cranberry-Vitamin C 250-60 MG CAPS Take 1 capsule by mouth daily.    diclofenac Sodium (VOLTAREN) 1 % GEL APPLY 2 GRAMS TO AFFECTED AREA(S) 4 TIMES DAILY AS NEEDED (Patient taking differently: Apply 2-4 g topically every 6 (six) hours as needed (to affected areas).)    Dulaglutide (TRULICITY) 4.81 EH/6.3JS SOPN Inject into the skin every Wednesday. 11/02/2020: MAR left the dosage as "1 dose"- not sure why??   gabapentin (NEURONTIN) 300 MG capsule Take 600 mg by mouth at bedtime.    glucose blood (ONETOUCH VERIO) test strip Use as instructed to check blood sugars 2 times per day dx: e11.65    insulin degludec (TRESIBA) 100 UNIT/ML FlexTouch Pen Inject 5 Units into the skin at bedtime.    metoprolol succinate (TOPROL-XL) 25 MG 24 hr tablet Take 1 tablet by mouth daily at bedtime    Multiple Vitamins-Minerals (MULTIVITAMIN WITH MINERALS) tablet Take 1 tablet by mouth daily. Women's 97+    OneTouch Delica Lancets 02O MISC Use as instructed to check blood sugars 2 times per day dx: e11.65    polyethylene glycol (MIRALAX / GLYCOLAX) 17 g packet Take 17 g by mouth daily.    simvastatin (ZOCOR) 10 MG tablet Take 1 tablet (10 mg total) by mouth daily. (Patient taking differently: Take 10 mg by mouth at bedtime.)    SYNTHROID 112 MCG tablet  Take 112 mcg by mouth daily before breakfast.    [DISCONTINUED] SEMGLEE, YFGN, 100 UNIT/ML Pen Inject 9 Units into the skin at bedtime.    No facility-administered encounter medications on file as of 11/19/2020.    Patient Active Problem List   Diagnosis Date Noted   Infected wound 11/02/2020   Pressure injury of skin 10/22/2020   Wound infection after surgery 10/12/2020   Normocytic anemia 10/12/2020   AKI (acute kidney injury) ([place]) 10/12/2020   Obesity (BMI 30-39.9) 10/12/2020   Sepsis ([place]) 10/12/2020   Type 2 diabetes mellitus with stage 3 chronic  kidney disease, with long-term current use of insulin ([place]) 11/29/2019   Panniculitis 09/30/2019   Back pain 09/30/2019   Hepatoma ([place]) 01/21/2019   Other cirrhosis of liver ([place]) 07/05/2018   Hypertensive nephropathy 07/05/2018   Chronic renal disease, stage III ([place]) 07/05/2018   Chronic left shoulder pain 07/05/2018   Hypothyroidism 02/06/2018   Hepatitis C virus infection cured after antiviral drug therapy 02/04/2018   Osteoarthritis of right knee 07/30/2017   S/P laparoscopic sleeve gastrectomy July 2018 08/26/2016   Preop cardiovascular exam 07/30/2016   Dyspnea on exertion 07/30/2016   Bilateral lower extremity edema 07/30/2016    Conditions to be addressed/monitored: HTN, DMII, and CKD Stage III  Care Plan : Social Work Care Plan  Updates made by Daneen Schick since 11/19/2020 12:00 AM     Problem: Mobility and Independence      Goal: Mobility and Independence Optimized   Start Date: 11/09/2020  Expected End Date: 12/24/2020  This Visit's Progress: On track  Priority: High  Note:   Current Barriers:  Chronic disease management support and education needs related to HTN, DM, and CKD Stage III   Recent hospitalization due to wound infection following panniculectomy Transportation Social Worker Clinical Goal(s):  patient will work with SW to identify and address any acute and/or chronic care coordination needs related to the self health management of HTN, DM, and CKD Stage III   patient will work with SW to address concerns related to care coordination needs post hospitalization  SW Interventions:  Inter-disciplinary care team collaboration (see longitudinal plan of care) Collaboration with Glendale Chard, MD regarding development and update of comprehensive plan of care as evidenced by provider attestation and co-signature Successful outbound call placed to the patient to assess for care coordination needs Discussed the patient currently has company and can not stay  on the phone long Patient reports she has an [place] appointment on Friday and would like to access [facility] transportation Advised the patient she may contact [facility] Transportation department directly to schedule this ride - patient stated understanding and confirms she has the contact number Patient reports she is experiencing lower leg edema which her home health nurse is concerned with Discussed the home health RN planned to contact the patients primary provider last Friday to discuss if compression stockings and/or short term diuretics were needed Advised the patient SW would contact her primary care provider regarding this concern considering the practice is closed on Friday's and she may not have spoken with the RN Collaboration with Dr. [facility] and [place] to advise of reported lower leg edema SW will follow up with the patient over the next 10 days  Patient Goals/Self-Care Activities patient will:   -  Follow up with primary care provider regarding lower leg edema -Contact [facility] transportation department to arrange transportation for Friday 9/30 appointment -Contact SW as needed  Follow Up Plan:  SW will follow up with the patient over the next 10 days       Follow Up Plan: SW will follow up with patient by phone over the next 10 days      Daneen Schick, BSW, CDP Social Worker, Certified Dementia Practitioner [facility] [facility] [facility] [place] [phone]

## 2020-11-19 NOTE — Telephone Encounter (Signed)
Anisa with [facility] called on patient's behalf to confirm order instructions. Please call to advise [phone]. Thank you.

## 2020-11-19 NOTE — Telephone Encounter (Signed)
Teresa Golden from [facility] called statin patient had 1+ pitting edema lower extremity. She advised patient to watch her intake of sodium and to elevate her legs as much as possible. Her blood pressure is stable. She just wanted to make you aware. [phone]

## 2020-11-19 NOTE — Telephone Encounter (Deleted)
Called and spoke with Anisa with [facility] regarding the message below.  She stated that she had been doing wet to dry dressings on the patient wound, and she wanted to confirm that is what she is to be doing.  Informed Anisa that I had spoken with

## 2020-11-19 NOTE — Patient Instructions (Signed)
Social Worker Visit Information  Goals we discussed today:   Goals Addressed             This Visit's Progress    Mobility and Independence Optimized   On track    Timeframe:  Short-Term Goal Priority:  High Start Date:  9.16.22                           Expected End Date:    10.31.22                   Next planned outreach: 10.3.22  Patient Goals/Self-Care Activities patient will:   - Follow up with primary care provider regarding lower leg edema -Contact [facility] transportation department to arrange transportation for Friday 9/30 appointment -Contact SW as needed         Materials Provided: Verbal education about [employer] program provided by phone  Patient verbalizes understanding of instructions provided today and agrees to view in [place].   Follow Up Plan: SW will follow up with patient by phone over the next 10 days Daneen Schick, BSW, CDP Social Worker, Certified Dementia Practitioner [place] [facility] [place] [facility] [phone]

## 2020-11-19 NOTE — Telephone Encounter (Signed)
Erroneous Encounter

## 2020-11-20 NOTE — Telephone Encounter (Signed)
Called on (11/19/2020) and spoke with Anisa with [facility] regarding the message below.  She stated that she had been doing wet to dry dressings on the patient wound, and she wanted to confirm that is what she is to be doing.    Informed Anisa that I had spoken with Vicente Males with [facility] on (11/09/20) regarding wound care treatment for the patient.  And also informed her of the other messages regarding the wound care for the patient.    Anisa stated that she was never informed to use the Aquacel AG on the patient's wound, but moving forward she will be using it.//AB/CMA

## 2020-11-22 ENCOUNTER — Ambulatory Visit (INDEPENDENT_AMBULATORY_CARE_PROVIDER_SITE_OTHER): Payer: Medicare HMO | Admitting: Internal Medicine

## 2020-11-22 ENCOUNTER — Other Ambulatory Visit: Payer: Self-pay

## 2020-11-22 ENCOUNTER — Encounter: Payer: Self-pay | Admitting: Internal Medicine

## 2020-11-22 ENCOUNTER — Ambulatory Visit: Payer: Medicare HMO | Admitting: Addiction (Substance Use Disorder)

## 2020-11-22 VITALS — BP 134/68 | HR 84 | Temp 98.7°F | Ht 65.0 in | Wt 200.2 lb

## 2020-11-22 DIAGNOSIS — M7989 Other specified soft tissue disorders: Secondary | ICD-10-CM

## 2020-11-22 DIAGNOSIS — Z6833 Body mass index (BMI) 33.0-33.9, adult: Secondary | ICD-10-CM

## 2020-11-22 DIAGNOSIS — E6609 Other obesity due to excess calories: Secondary | ICD-10-CM | POA: Diagnosis not present

## 2020-11-22 DIAGNOSIS — Z23 Encounter for immunization: Secondary | ICD-10-CM

## 2020-11-22 DIAGNOSIS — C220 Liver cell carcinoma: Secondary | ICD-10-CM

## 2020-11-22 MED ORDER — HYDROCHLOROTHIAZIDE 25 MG PO TABS: 25.0000 mg | ORAL_TABLET | Freq: Every day | ORAL | 0 refills | Status: DC

## 2020-11-22 NOTE — Patient Instructions (Signed)
Edema  Edema is when you have too much fluid in your body or under your skin. Edema may make your legs, feet, and ankles swell up. Swelling is also common in looser tissues, like around your eyes. This is a common condition. It gets more common as you get older. There are many possible causes of edema. Eating too much salt (sodium) and being on your feet or sitting for a long time can cause edema in yourlegs, feet, and ankles. Hot weather may make edema worse. Edema is usually painless. Your skin may look swollen or shiny. Follow these instructions at home: Keep the swollen body part raised (elevated) above the level of your heart when you are sitting or lying down. Do not sit still or stand for a long time. Do not wear tight clothes. Do not wear garters on your upper legs. Exercise your legs. This can help the swelling go down. Wear elastic bandages or support stockings as told by your doctor. Eat a low-salt (low-sodium) diet to reduce fluid as told by your doctor. Depending on the cause of your swelling, you may need to limit how much fluid you drink (fluid restriction). Take over-the-counter and prescription medicines only as told by your doctor. Contact a doctor if: Treatment is not working. You have heart, liver, or kidney disease and have symptoms of edema. You have sudden and unexplained weight gain. Get help right away if: You have shortness of breath or chest pain. You cannot breathe when you lie down. You have pain, redness, or warmth in the swollen areas. You have heart, liver, or kidney disease and get edema all of a sudden. You have a fever and your symptoms get worse all of a sudden. Summary Edema is when you have too much fluid in your body or under your skin. Edema may make your legs, feet, and ankles swell up. Swelling is also common in looser tissues, like around your eyes. Raise (elevate) the swollen body part above the level of your heart when you are sitting or lying  down. Follow your doctor's instructions about diet and how much fluid you can drink (fluid restriction). This information is not intended to replace advice given to you by your health care provider. Make sure you discuss any questions you have with your healthcare provider. Document Revised: 12/07/2019 Document Reviewed: 12/07/2019 Elsevier Patient Education  2022 Elsevier Inc.  

## 2020-11-22 NOTE — Progress Notes (Signed)
I,Teresa Golden,acting as a Education administrator for Teresa Greenland, MD.,have documented all relevant documentation on the behalf of Teresa Greenland, MD,as directed by  Teresa Greenland, MD while in the presence of Teresa Greenland, MD.  This visit occurred during the SARS-CoV-2 public health emergency.  Safety protocols were in place, including screening questions prior to the visit, additional usage of staff PPE, and extensive cleaning of exam room while observing appropriate contact time as indicated for disinfecting solutions.  Subjective:     [name] , female    DOB: Jul 02, 1945 , 75 y.o.   MRN: [id number]   Chief Complaint  Patient presents with   Leg Swelling    HPI  Patient is here today for leg swelling. She has noticed her sx have worsened over the past 2-3 weeks. Denies h/o CHF and DVT. Of note, she underwent panniculectomy and supraumbilical hernia repair on 09/26/20.   She was seen as an outpatient by plastic surgery on 8/19 for fever, chills, wound infection.  She was hospitalized, underwent irrigation and debridement with wound VAC placement on 8/22.  She was discharged to [facility] skilled nursing facility on 8/29.   On 9/9, patient was sent from the facility with fever, concern of surgical site infection and patient's noncompliance to wound VAC.  She was admitted to hospitalist service and started on iv abx. She completed treatment and discharged on Keflex and home health wound care. She admits she has had decreased activity since her surgery.     Past Medical History:  Diagnosis Date   Arthritis    Chronic kidney disease    self reports ckd stage 3    Depression    Diabetes mellitus, type II, insulin dependent ([place])    With neurologic complications. Bilateral lower extremity peripheral neuropathy   Dyspnea    with excertion; no issues now since weight loss surgery    Endometriosis    Essential hypertension    GERD (gastroesophageal reflux disease)    Hepatitis  C    C dormant; states she is in remission since taking Harvoni    Hypertensive nephropathy 07/05/2018   Hypothyroidism    Hypothyroidism 02/06/2018   Morbid obesity with BMI of 50.0-59.9, adult ([place])    Scoliosis      Family History  Problem Relation Age of Onset   Heart attack Mother    Heart failure Mother    Stroke Father      Current Outpatient Medications:    hydrochlorothiazide (HYDRODIURIL) 25 MG tablet, Take 1 tablet (25 mg total) by mouth daily., Disp: 30 tablet, Rfl: 0   Semaglutide, 1 MG/DOSE, (OZEMPIC, 1 MG/DOSE,) 4 MG/3ML SOPN, Inject into the skin., Disp: , Rfl:    acetaminophen (TYLENOL) 325 MG tablet, Take 2 tablets (650 mg total) by mouth every 6 (six) hours as needed for moderate pain or mild pain., Disp: , Rfl:    amLODipine (NORVASC) 5 MG tablet, Take 1 tablet (5 mg total) by mouth in the morning., Disp: 30 tablet, Rfl: 0   B Complex-C (B-COMPLEX WITH VITAMIN C) tablet, Take 1 tablet by mouth daily., Disp: , Rfl:    Biotin 10000 MCG TABS, Take 10,000 mcg by mouth daily., Disp: , Rfl:    Cholecalciferol 5000 units TABS, Take 5,000 Units by mouth daily., Disp: , Rfl:    colchicine 0.6 MG tablet, Take 1 tablet (0.6 mg total) by mouth daily for 7 days., Disp: 7 tablet, Rfl: 0   Cranberry-Vitamin C 250-60  MG CAPS, Take 1 capsule by mouth daily., Disp: , Rfl:    diclofenac Sodium (VOLTAREN) 1 % GEL, APPLY 2 GRAMS TO AFFECTED AREA(S) 4 TIMES DAILY AS NEEDED (Patient taking differently: Apply 2-4 g topically every 6 (six) hours as needed (to affected areas).), Disp: 200 g, Rfl: 2   gabapentin (NEURONTIN) 300 MG capsule, Take 600 mg by mouth at bedtime., Disp: , Rfl:    glucose blood (ONETOUCH VERIO) test strip, Use as instructed to check blood sugars 2 times per day dx: e11.65, Disp: 300 each, Rfl: 2   insulin degludec (TRESIBA) 100 UNIT/ML FlexTouch Pen, Inject 5 Units into the skin at bedtime., Disp: 3 mL, Rfl: 0   metoprolol succinate (TOPROL-XL) 25 MG 24 hr tablet,  Take 1 tablet by mouth daily at bedtime, Disp: 90 tablet, Rfl: 1   Multiple Vitamins-Minerals (MULTIVITAMIN WITH MINERALS) tablet, Take 1 tablet by mouth daily. Women's 50+, Disp: , Rfl:    OneTouch Delica Lancets 16X MISC, Use as instructed to check blood sugars 2 times per day dx: e11.65, Disp: 300 each, Rfl: 2   polyethylene glycol (MIRALAX / GLYCOLAX) 17 g packet, Take 17 g by mouth daily., Disp: 14 each, Rfl: 0   simvastatin (ZOCOR) 10 MG tablet, Take 1 tablet (10 mg total) by mouth daily. (Patient taking differently: Take 10 mg by mouth at bedtime.), Disp: 90 tablet, Rfl: 1   SYNTHROID 112 MCG tablet, Take 112 mcg by mouth daily before breakfast., Disp: , Rfl:    Allergies  Allergen Reactions   Meloxicam Other (See Comments)    Fever; muscle aches; "flu-like" symptoms "Allergic," per [facility]   Other Other (See Comments)    "Rose fever and hay fever"     Review of Systems  Constitutional: Negative.   Respiratory: Negative.  Negative for chest tightness and shortness of breath.   Cardiovascular:  Positive for leg swelling.  Gastrointestinal: Negative.   Neurological: Negative.   Psychiatric/Behavioral: Negative.      Today's Vitals   11/22/20 0856  BP: 134/68  Pulse: 84  Temp: 98.7 F (37.1 C)  TempSrc: Oral  Weight: 200 lb 3.2 oz (90.8 kg)  Height: 5\' 5"  (1.651 m)   Body mass index is 33.32 kg/m.  Wt Readings from Last 3 Encounters:  11/22/20 200 lb 3.2 oz (90.8 kg)  11/06/20 197 lb (89.4 kg)  10/29/20 214 lb 1.1 oz (97.1 kg)    Objective:  Physical Exam Vitals and nursing note reviewed.  Constitutional:      Appearance: Normal appearance.  HENT:     Head: Normocephalic and atraumatic.     Nose:     Comments: Masked     Mouth/Throat:     Comments: Masked  Cardiovascular:     Rate and Rhythm: Normal rate and regular rhythm.     Heart sounds: Normal heart sounds.  Pulmonary:     Effort: Pulmonary effort is normal.     Breath sounds: Normal breath sounds.   Musculoskeletal:     Cervical back: Normal range of motion.     Right lower leg: Edema present.     Left lower leg: Edema present.     Comments: L>R edema  LLE 16 inches, RLE 14.75 inches  Skin:    General: Skin is warm.  Neurological:     General: No focal deficit present.     Mental Status: She is alert.  Psychiatric:        Mood and Affect: Mood normal.  Behavior: Behavior normal.        Assessment And Plan:     1. Localized swelling of lower extremity Comments: I will check D-dimer. Left leg 16 inches, R calf is 14.75 inches - D-dimer, quantitative (not at [facility]) - VAS [place] LOWER EXTREMITY VENOUS (DVT); Future  2. Hepatoma [facility]) Comments: Chronic, followed by hepatology.   3. Class 1 obesity due to excess calories with serious comorbidity and body mass index (BMI) of 33.0 to 33.9 in adult Comments: She is encouraged to strive for BMI less than 30 to decrease cardiac risk. Advised to aim for at least 150 minutes of exercise per week.  4. Need for influenza vaccination Comments: She was given high dose flu vaccine.  - Flu Vaccine QUAD High Dose(Fluad)   Patient was given opportunity to ask questions. Patient verbalized understanding of the plan and was able to repeat key elements of the plan. All questions were answered to their satisfaction.   I, Teresa Greenland, MD, have reviewed all documentation for this visit. The documentation on 11/22/20 for the exam, diagnosis, procedures, and orders are all accurate and complete.   IF YOU HAVE BEEN REFERRED TO A SPECIALIST, IT MAY TAKE 1-2 WEEKS TO SCHEDULE/PROCESS THE REFERRAL. IF YOU HAVE NOT HEARD FROM US/SPECIALIST IN TWO WEEKS, PLEASE GIVE [place] A CALL AT [phone].   THE PATIENT IS ENCOURAGED TO PRACTICE SOCIAL DISTANCING DUE TO THE COVID-19 PANDEMIC.

## 2020-11-23 ENCOUNTER — Telehealth: Payer: Self-pay

## 2020-11-23 ENCOUNTER — Telehealth: Payer: Self-pay | Admitting: Plastic Surgery

## 2020-11-23 ENCOUNTER — Other Ambulatory Visit: Payer: Self-pay

## 2020-11-23 ENCOUNTER — Encounter (HOSPITAL_COMMUNITY): Payer: Medicare HMO

## 2020-11-23 ENCOUNTER — Encounter: Payer: Self-pay | Admitting: Plastic Surgery

## 2020-11-23 ENCOUNTER — Ambulatory Visit (INDEPENDENT_AMBULATORY_CARE_PROVIDER_SITE_OTHER): Payer: Medicare HMO | Admitting: Plastic Surgery

## 2020-11-23 DIAGNOSIS — T8149XA Infection following a procedure, other surgical site, initial encounter: Secondary | ICD-10-CM

## 2020-11-23 DIAGNOSIS — N182 Chronic kidney disease, stage 2 (mild): Secondary | ICD-10-CM | POA: Diagnosis not present

## 2020-11-23 DIAGNOSIS — I129 Hypertensive chronic kidney disease with stage 1 through stage 4 chronic kidney disease, or unspecified chronic kidney disease: Secondary | ICD-10-CM

## 2020-11-23 DIAGNOSIS — E1122 Type 2 diabetes mellitus with diabetic chronic kidney disease: Secondary | ICD-10-CM | POA: Diagnosis not present

## 2020-11-23 DIAGNOSIS — N183 Chronic kidney disease, stage 3 unspecified: Secondary | ICD-10-CM | POA: Diagnosis not present

## 2020-11-23 DIAGNOSIS — Z794 Long term (current) use of insulin: Secondary | ICD-10-CM

## 2020-11-23 LAB — D-DIMER, QUANTITATIVE: D-DIMER: 6.61 mg{FEU}/L — ABNORMAL HIGH (ref 0.00–0.49)

## 2020-11-23 NOTE — Telephone Encounter (Signed)
I left the pt a detailed message that her d dimer test is positive, the pt needs to mobilize over the weekend- will try to see if she can get u/s today somewhere, she needs to be taking an aspirin, if she has SOB to go to the ER, and to confirm if it's the lle.

## 2020-11-23 NOTE — Telephone Encounter (Signed)
Returned patients call. Per Dr. Marla Roe, ok to take ASA. [name] indicated it would only be for 2 days, prior to doppler test.

## 2020-11-23 NOTE — Progress Notes (Signed)
   Subjective:    [name], female    DOB: 1945/06/06, 75 y.o.   MRN: [id number]  The patient is a 75 year old female here for follow-up on her abdominal wound.  The patient had a panniculectomy and had some complications.  She is doing much better.  The area is healing very nicely and coming together.  She has home health which she is very pleased with.  No sign of infection.  It is about half healed.     Review of Systems  Constitutional:  Positive for activity change. Negative for appetite change.  Eyes: Negative.   Respiratory: Negative.    Cardiovascular:  Positive for leg swelling.  Gastrointestinal: Negative.   Genitourinary: Negative.       Objective:   Physical Exam Vitals and nursing note reviewed.  Constitutional:      Appearance: Normal appearance.  Cardiovascular:     Rate and Rhythm: Normal rate.     Pulses: Normal pulses.  Abdominal:     General: Abdomen is flat. There is no distension.     Palpations: There is no mass.     Tenderness: There is abdominal tenderness. There is no guarding.     Hernia: No hernia is present.  Neurological:     Mental Status: She is alert and oriented to person, place, and time.  Psychiatric:        Mood and Affect: Mood normal.        Behavior: Behavior normal.       Assessment & Plan:     ICD-10-CM   1. Wound infection after surgery  T81.49XA       Continue with dressing changes per home health.  We will do Xeroform today because that is what we have available.  I would like to see the patient back in 2 to 3 weeks.  She knows if she has any questions or concerns to give me a call.  She can shower and can get the area wet

## 2020-11-23 NOTE — Telephone Encounter (Signed)
The pt was notified that the appt that was scheduled for today for venous ultrasound has been rescheduled for Monday at 10 am because the pt has to notify scat prior to her appts the pt was notified to go to the ER if she has SOB

## 2020-11-23 NOTE — Telephone Encounter (Signed)
Patient left message saying that her PCP has ordered a doppler test on Monday. Her blood test came back high. Her pcp wants her to take aspirin and she wants to know if it is okay with Dr. Marla Roe for her to take this due to her PCP's concern with bleeding. Please call her back to advise. [phone]

## 2020-11-26 ENCOUNTER — Other Ambulatory Visit: Payer: Self-pay

## 2020-11-26 ENCOUNTER — Ambulatory Visit (HOSPITAL_COMMUNITY)
Admission: RE | Admit: 2020-11-26 | Discharge: 2020-11-26 | Disposition: A | Discharge status: Home / Self Care | Payer: Medicare HMO | Source: Ambulatory Visit | Attending: Internal Medicine | Admitting: Internal Medicine

## 2020-11-26 ENCOUNTER — Ambulatory Visit (INDEPENDENT_AMBULATORY_CARE_PROVIDER_SITE_OTHER): Payer: Medicare HMO

## 2020-11-26 DIAGNOSIS — M7989 Other specified soft tissue disorders: Secondary | ICD-10-CM | POA: Diagnosis not present

## 2020-11-26 DIAGNOSIS — N183 Chronic kidney disease, stage 3 unspecified: Secondary | ICD-10-CM

## 2020-11-26 DIAGNOSIS — N182 Chronic kidney disease, stage 2 (mild): Secondary | ICD-10-CM

## 2020-11-26 DIAGNOSIS — Z794 Long term (current) use of insulin: Secondary | ICD-10-CM

## 2020-11-26 NOTE — Progress Notes (Signed)
Lower extremity venous has been completed.   Preliminary results in CV Proc.  Results given to MD.   Archie Patten 11/26/2020 10:52 AM

## 2020-11-26 NOTE — Chronic Care Management (AMB) (Signed)
Chronic Care Management    Social Work Note  11/26/2020 Name: [name] MRN: [id number] DOB: September 26, 1945  Teresa Golden is a 75 y.o. year old female who is a primary care patient of Glendale Chard, MD. The CCM team was consulted to assist the patient with chronic disease management and/or care coordination needs related to:  DM II, CKD III, HTN .   Engaged with patient by telephone for follow up visit in response to provider referral for social work chronic care management and care coordination services.   Consent to Services:  The patient was given information about Chronic Care Management services, agreed to services, and gave verbal consent prior to initiation of services.  Please see initial visit note for detailed documentation.   Patient agreed to services and consent obtained.   Assessment: Review of patient past medical history, allergies, medications, and health status, including review of relevant consultants reports was performed today as part of a comprehensive evaluation and provision of chronic care management and care coordination services.     SDOH (Social Determinants of Health) assessments and interventions performed:    Advanced Directives Status: Not addressed in this encounter.  CCM Care Plan  Allergies  Allergen Reactions   Meloxicam Other (See Comments)    Fever; muscle aches; "flu-like" symptoms "Allergic," per [facility]   Other Other (See Comments)    "Rose fever and hay fever"    Outpatient Encounter Medications as of 11/26/2020  Medication Sig   acetaminophen (TYLENOL) 325 MG tablet Take 2 tablets (650 mg total) by mouth every 6 (six) hours as needed for moderate pain or mild pain.   amLODipine (NORVASC) 5 MG tablet Take 1 tablet (5 mg total) by mouth in the morning.   B Complex-C (B-COMPLEX WITH VITAMIN C) tablet Take 1 tablet by mouth daily.   Biotin 10000 MCG TABS Take 10,000 mcg by mouth daily.   Cholecalciferol 5000 units TABS Take 5,000 Units by  mouth daily.   colchicine 0.6 MG tablet Take 1 tablet (0.6 mg total) by mouth daily for 7 days.   Cranberry-Vitamin C 250-60 MG CAPS Take 1 capsule by mouth daily.   diclofenac Sodium (VOLTAREN) 1 % GEL APPLY 2 GRAMS TO AFFECTED AREA(S) 4 TIMES DAILY AS NEEDED (Patient taking differently: Apply 2-4 g topically every 6 (six) hours as needed (to affected areas).)   gabapentin (NEURONTIN) 300 MG capsule Take 600 mg by mouth at bedtime.   glucose blood (ONETOUCH VERIO) test strip Use as instructed to check blood sugars 2 times per day dx: e11.65   hydrochlorothiazide (HYDRODIURIL) 25 MG tablet Take 1 tablet (25 mg total) by mouth daily.   insulin degludec (TRESIBA) 100 UNIT/ML FlexTouch Pen Inject 5 Units into the skin at bedtime.   metoprolol succinate (TOPROL-XL) 25 MG 24 hr tablet Take 1 tablet by mouth daily at bedtime   Multiple Vitamins-Minerals (MULTIVITAMIN WITH MINERALS) tablet Take 1 tablet by mouth daily. Women's 95+   OneTouch Delica Lancets 62Z MISC Use as instructed to check blood sugars 2 times per day dx: e11.65   polyethylene glycol (MIRALAX / GLYCOLAX) 17 g packet Take 17 g by mouth daily.   Semaglutide, 1 MG/DOSE, (OZEMPIC, 1 MG/DOSE,) 4 MG/3ML SOPN Inject into the skin.   simvastatin (ZOCOR) 10 MG tablet Take 1 tablet (10 mg total) by mouth daily. (Patient taking differently: Take 10 mg by mouth at bedtime.)   SYNTHROID 112 MCG tablet Take 112 mcg by mouth daily before breakfast.   [DISCONTINUED]  SEMGLEE, YFGN, 100 UNIT/ML Pen Inject 9 Units into the skin at bedtime.   No facility-administered encounter medications on file as of 11/26/2020.    Patient Active Problem List   Diagnosis Date Noted   Infected wound 11/02/2020   Pressure injury of skin 10/22/2020   Wound infection after surgery 10/12/2020   Normocytic anemia 10/12/2020   AKI (acute kidney injury) ([place]) 10/12/2020   Obesity (BMI 30-39.9) 10/12/2020   Sepsis ([place]) 10/12/2020   Type 2 diabetes mellitus with stage 3  chronic kidney disease, with long-term current use of insulin ([place]) 11/29/2019   Panniculitis 09/30/2019   Back pain 09/30/2019   Hepatoma ([place]) 01/21/2019   Other cirrhosis of liver ([place]) 07/05/2018   Hypertensive nephropathy 07/05/2018   Chronic renal disease, stage III ([place]) 07/05/2018   Chronic left shoulder pain 07/05/2018   Hypothyroidism 02/06/2018   Hepatitis C virus infection cured after antiviral drug therapy 02/04/2018   Osteoarthritis of right knee 07/30/2017   S/P laparoscopic sleeve gastrectomy July 2018 08/26/2016   Preop cardiovascular exam 07/30/2016   Dyspnea on exertion 07/30/2016   Bilateral lower extremity edema 07/30/2016    Conditions to be addressed/monitored: HTN, DMII, and CKD Stage III  Care Plan : Social Work Care Plan  Updates made by Daneen Schick since 11/26/2020 12:00 AM     Problem: Mobility and Independence      Goal: Mobility and Independence Optimized   Start Date: 11/09/2020  Expected End Date: 12/24/2020  Recent Progress: On track  Priority: High  Note:   Current Barriers:  Chronic disease management support and education needs related to HTN, DM, and CKD Stage III   Recent hospitalization due to wound infection following panniculectomy Transportation Social Worker Clinical Goal(s):  patient will work with SW to identify and address any acute and/or chronic care coordination needs related to the self health management of HTN, DM, and CKD Stage III   patient will work with SW to address concerns related to care coordination needs post hospitalization  SW Interventions:  Inter-disciplinary care team collaboration (see longitudinal plan of care) Collaboration with Glendale Chard, MD regarding development and update of comprehensive plan of care as evidenced by provider attestation and co-signature SW performed chart review to note patient participated in an ultrasound this morning to assess leg swelling Spoke with the patient who reports  she has a clot in her left leg Patient reports she has been seen by Dr. Baird Cancer who has started her on Xarelto Patient stated she is unsure if she should continue diuretic while taking Xarelto Advised the patient SW could contact Dr. Baird Cancer to determine if she should continue medication - patient declined stating her home health nurse already contacted the office and patient was advised to stop taking diuretic Discussed the patient feels her wound is healing well - patient reports home health RN has added Silvadene to the wound with dressing changes and she feels this has improved healing rate Encouraged the patient to contact SW as needed Collaboration with RN Care Manager to advise of patients changes to medications and wound care regimen Patient Goals/Self-Care Activities patient will:   -  Follow up with primary care provider regarding lower leg edema -Take Xarelto as prescirbed -Contact SW as needed  Follow Up Plan:  SW will follow up with the patient over the next 30 days       Follow Up Plan: SW will follow up with patient by phone over the next month  Daneen Schick, BSW, CDP Social Worker, Certified Dementia Practitioner [place] [facility] [place] [facility] [phone]

## 2020-11-26 NOTE — Patient Instructions (Signed)
Social Worker Visit Information  Goals we discussed today:   Goals Addressed             This Visit's Progress    Mobility and Independence Optimized       Timeframe:  Short-Term Goal Priority:  High Start Date:  9.16.22                           Expected End Date:    10.31.22                   Next planned outreach: 11.2.22  Patient Goals/Self-Care Activities patient will:   - Follow up with primary care provider regarding lower leg edema -Take Xarelto as prescirbed -Contact SW as needed         Patient verbalizes understanding of instructions provided today and agrees to view in [place].   Follow Up Plan: SW will follow up with patient by phone over the next month  Daneen Schick, BSW, CDP Social Worker, Certified Dementia Practitioner [place] [facility] [place] [facility] [phone]

## 2020-11-26 NOTE — Telephone Encounter (Signed)
error 

## 2020-11-29 ENCOUNTER — Telehealth: Payer: Self-pay | Admitting: Plastic Surgery

## 2020-11-29 ENCOUNTER — Telehealth: Payer: Medicare HMO

## 2020-11-29 ENCOUNTER — Ambulatory Visit: Payer: Self-pay

## 2020-11-29 DIAGNOSIS — F419 Anxiety disorder, unspecified: Secondary | ICD-10-CM

## 2020-11-29 DIAGNOSIS — M793 Panniculitis, unspecified: Secondary | ICD-10-CM

## 2020-11-29 DIAGNOSIS — E1122 Type 2 diabetes mellitus with diabetic chronic kidney disease: Secondary | ICD-10-CM

## 2020-11-29 DIAGNOSIS — I129 Hypertensive chronic kidney disease with stage 1 through stage 4 chronic kidney disease, or unspecified chronic kidney disease: Secondary | ICD-10-CM

## 2020-11-29 DIAGNOSIS — Z6836 Body mass index (BMI) 36.0-36.9, adult: Secondary | ICD-10-CM

## 2020-11-29 DIAGNOSIS — N183 Chronic kidney disease, stage 3 unspecified: Secondary | ICD-10-CM

## 2020-11-29 DIAGNOSIS — Z794 Long term (current) use of insulin: Secondary | ICD-10-CM

## 2020-11-29 NOTE — Telephone Encounter (Signed)
Patient is experiencing a "pinch or pul" on her right side whenever she moves. Patient notes drainage but no fever, chills, or additional swelling. Sx 8/3. Please call to advise [phone]. Thank you.

## 2020-11-29 NOTE — Telephone Encounter (Signed)
Returned patients call. She has been having pinching/pulling sensation lateral right of abdominal incision for a week. Spoke with current nurse, Anissa who saw patient today and is going on vacation for a week. She verified it was inside sutures that was observed during visit, they were sited on the left as well. Patient was not complaining of pain on that side. Called [name] [phone]), advised that it was sutures working it way out, and are dissolvable. We will remove them at her next follow up 12/14/2020. For the pain, patient can take Tylenol 500mg  as needed, continue to keep covered. She is now on Xarelto due to recent finding of blood clot left leg. Patient understood and agreed with plan.

## 2020-11-30 ENCOUNTER — Telehealth: Payer: Medicare HMO

## 2020-11-30 ENCOUNTER — Ambulatory Visit: Payer: Self-pay

## 2020-11-30 DIAGNOSIS — I129 Hypertensive chronic kidney disease with stage 1 through stage 4 chronic kidney disease, or unspecified chronic kidney disease: Secondary | ICD-10-CM

## 2020-11-30 DIAGNOSIS — N182 Chronic kidney disease, stage 2 (mild): Secondary | ICD-10-CM

## 2020-11-30 DIAGNOSIS — F419 Anxiety disorder, unspecified: Secondary | ICD-10-CM

## 2020-11-30 DIAGNOSIS — N183 Chronic kidney disease, stage 3 unspecified: Secondary | ICD-10-CM

## 2020-11-30 DIAGNOSIS — Z794 Long term (current) use of insulin: Secondary | ICD-10-CM

## 2020-11-30 DIAGNOSIS — M793 Panniculitis, unspecified: Secondary | ICD-10-CM

## 2020-11-30 DIAGNOSIS — M7989 Other specified soft tissue disorders: Secondary | ICD-10-CM

## 2020-11-30 DIAGNOSIS — E1122 Type 2 diabetes mellitus with diabetic chronic kidney disease: Secondary | ICD-10-CM

## 2020-12-03 ENCOUNTER — Ambulatory Visit: Payer: Self-pay

## 2020-12-03 ENCOUNTER — Telehealth: Payer: Medicare HMO

## 2020-12-03 DIAGNOSIS — M793 Panniculitis, unspecified: Secondary | ICD-10-CM

## 2020-12-03 DIAGNOSIS — Z794 Long term (current) use of insulin: Secondary | ICD-10-CM

## 2020-12-03 DIAGNOSIS — I129 Hypertensive chronic kidney disease with stage 1 through stage 4 chronic kidney disease, or unspecified chronic kidney disease: Secondary | ICD-10-CM

## 2020-12-03 DIAGNOSIS — N183 Chronic kidney disease, stage 3 unspecified: Secondary | ICD-10-CM

## 2020-12-03 DIAGNOSIS — E1122 Type 2 diabetes mellitus with diabetic chronic kidney disease: Secondary | ICD-10-CM

## 2020-12-03 DIAGNOSIS — F419 Anxiety disorder, unspecified: Secondary | ICD-10-CM

## 2020-12-03 NOTE — Chronic Care Management (AMB) (Signed)
Chronic Care Management   CCM RN Visit Note  11/29/2020 Name: Teresa Golden MRN: [id number] DOB: 14-May-1945  Subjective: Teresa Golden is a 75 y.o. year old female who is a primary care patient of Glendale Chard, MD. The care management team was consulted for assistance with disease management and care coordination needs.    Engaged with patient by telephone for follow up visit in response to provider referral for case management and/or care coordination services.   Consent to Services:  The patient was given information about Chronic Care Management services, agreed to services, and gave verbal consent prior to initiation of services.  Please see initial visit note for detailed documentation.   Patient agreed to services and verbal consent obtained.   Assessment: Review of patient past medical history, allergies, medications, health status, including review of consultants reports, laboratory and other test data, was performed as part of comprehensive evaluation and provision of chronic care management services.   SDOH (Social Determinants of Health) assessments and interventions performed:  Yes, no acute challenges   CCM Care Plan  Allergies  Allergen Reactions   Meloxicam Other (See Comments)    Fever; muscle aches; "flu-like" symptoms "Allergic," per [facility]   Other Other (See Comments)    "Rose fever and hay fever"    Outpatient Encounter Medications as of 11/29/2020  Medication Sig   acetaminophen (TYLENOL) 325 MG tablet Take 2 tablets (650 mg total) by mouth every 6 (six) hours as needed for moderate pain or mild pain.   amLODipine (NORVASC) 5 MG tablet Take 1 tablet (5 mg total) by mouth in the morning.   B Complex-C (B-COMPLEX WITH VITAMIN C) tablet Take 1 tablet by mouth daily.   Biotin 10000 MCG TABS Take 10,000 mcg by mouth daily.   Cholecalciferol 5000 units TABS Take 5,000 Units by mouth daily.   colchicine 0.6 MG tablet Take 1 tablet (0.6 mg total) by mouth daily for 7  days.   Cranberry-Vitamin C 250-60 MG CAPS Take 1 capsule by mouth daily.   diclofenac Sodium (VOLTAREN) 1 % GEL APPLY 2 GRAMS TO AFFECTED AREA(S) 4 TIMES DAILY AS NEEDED (Patient taking differently: Apply 2-4 g topically every 6 (six) hours as needed (to affected areas).)   gabapentin (NEURONTIN) 300 MG capsule Take 600 mg by mouth at bedtime.   glucose blood (ONETOUCH VERIO) test strip Use as instructed to check blood sugars 2 times per day dx: e11.65   hydrochlorothiazide (HYDRODIURIL) 25 MG tablet Take 1 tablet (25 mg total) by mouth daily.   insulin degludec (TRESIBA) 100 UNIT/ML FlexTouch Pen Inject 5 Units into the skin at bedtime.   metoprolol succinate (TOPROL-XL) 25 MG 24 hr tablet Take 1 tablet by mouth daily at bedtime   Multiple Vitamins-Minerals (MULTIVITAMIN WITH MINERALS) tablet Take 1 tablet by mouth daily. Women's 65+   OneTouch Delica Lancets 68L MISC Use as instructed to check blood sugars 2 times per day dx: e11.65   polyethylene glycol (MIRALAX / GLYCOLAX) 17 g packet Take 17 g by mouth daily.   Semaglutide, 1 MG/DOSE, (OZEMPIC, 1 MG/DOSE,) 4 MG/3ML SOPN Inject into the skin.   simvastatin (ZOCOR) 10 MG tablet Take 1 tablet (10 mg total) by mouth daily. (Patient taking differently: Take 10 mg by mouth at bedtime.)   SYNTHROID 112 MCG tablet Take 112 mcg by mouth daily before breakfast.   [DISCONTINUED] SEMGLEE, YFGN, 100 UNIT/ML Pen Inject 9 Units into the skin at bedtime.   No facility-administered encounter medications on  file as of 11/29/2020.    Patient Active Problem List   Diagnosis Date Noted   Infected wound 11/02/2020   Pressure injury of skin 10/22/2020   Wound infection after surgery 10/12/2020   Normocytic anemia 10/12/2020   AKI (acute kidney injury) ([place]) 10/12/2020   Obesity (BMI 30-39.9) 10/12/2020   Sepsis ([place]) 10/12/2020   Type 2 diabetes mellitus with stage 3 chronic kidney disease, with long-term current use of insulin ([place]) 11/29/2019    Panniculitis 09/30/2019   Back pain 09/30/2019   Hepatoma ([place]) 01/21/2019   Other cirrhosis of liver ([place]) 07/05/2018   Hypertensive nephropathy 07/05/2018   Chronic renal disease, stage III ([place]) 07/05/2018   Chronic left shoulder pain 07/05/2018   Hypothyroidism 02/06/2018   Hepatitis C virus infection cured after antiviral drug therapy 02/04/2018   Osteoarthritis of right knee 07/30/2017   S/P laparoscopic sleeve gastrectomy July 2018 08/26/2016   Preop cardiovascular exam 07/30/2016   Dyspnea on exertion 07/30/2016   Bilateral lower extremity edema 07/30/2016    Conditions to be addressed/monitored: DM, CKD III, Hypertensive Nephropathy, Class 2 severe obesity, Anxiety, Panniculitis   Care Plan : Diabetes Type 2 (Adult)  Updates made by Lynne Logan, RN since 11/29/2020 12:00 AM     Problem: Glycemic Management (Diabetes, Type 2)   Priority: High     Long-Range Goal: Glycemic Management Optimized   Start Date: 02/01/2020  Expected End Date: 01/31/2021  Recent Progress: On track  Priority: High  Note:   Objective:  Lab Results  Component Value Date   HGBA1C 6.4 (H) 09/18/2020   Lab Results  Component Value Date   CREATININE 0.89 11/06/2020   CREATININE 0.92 11/05/2020   CREATININE 1.02 (H) 11/04/2020   Lab Results  Component Value Date   EGFR 62 05/31/2020  Current Barriers:  Knowledge Deficits related to basic Diabetes pathophysiology and self care/management Case Manager Clinical Goal(s):  Patient will demonstrate improved adherence to prescribed treatment plan for diabetes self care/management as evidenced by:  daily monitoring and recording of CBG  adherence to ADA/ carb modified diet exercise 3-5 days/week adherence to prescribed medication regimen Interventions:  11/29/20 completed successful outbound call with patient  Provided education to patient about basic DM disease process Review of patient status, including review of consultant's reports,  relevant laboratory and other test results, and medications completed. Reviewed medications with patient and discussed importance of medication adherence Educated patient on dietary and exercise recommendations; daily glycemic control FBS 80-130, <180 after meals;15'15' rule Educated patient while providing rationale re: wound healing may be delayed secondary to having elevated blood glucose levels Advised patient, providing education and rationale, to check cbg daily before meals and at bedtime and record, calling the CCM team and or PCP for findings outside established parameters Discussed plans with patient for ongoing care management follow up and provided patient with direct contact information for care management team Patient Goals/Self-Care Activities Self administers oral medications as prescribed Self administers insulin as prescribed Self administers injectable DM medication (Ozempic, [name]) as prescribed Attends all scheduled provider appointments Checks blood sugars as prescribed and utilize hyper and hypoglycemia protocol as needed Adheres to prescribed ADA/carb modified - Perform daily foot checks - Schedule eye and dental exams - enter blood sugar readings and medication or insulin into daily log - take the blood sugar log to all doctor visits - take the blood sugar meter to all doctor visits    Follow Up Plan: Telephone follow up appointment with care management  team member scheduled for: 01/03/21    Care Plan : Hypertension (Adult)  Updates made by Lynne Logan, RN since 11/29/2020 12:00 AM     Problem: Hypertension (Hypertension)   Priority: High     Long-Range Goal: Hypertension Monitored   Start Date: 02/01/2020  Expected End Date: 01/31/2021  Recent Progress: On track  Priority: High  Note:   Objective:  Last practice recorded BP readings:  BP Readings from Last 3 Encounters:  11/22/20 134/68  11/07/20 (!) 157/61  10/30/20 (!) 163/59   Most recent  eGFR/CrCl:  Lab Results  Component Value Date   EGFR 62 05/31/2020    No components found for: CRCL Current Barriers:  Knowledge Deficits related to basic understanding of hypertension pathophysiology and self care management Case Manager Clinical Goal(s):  Patient will verbalize understanding of plan for hypertension management Interventions:  11/29/20 completed successful outbound call to patient  Collaboration with Glendale Chard, MD regarding development and update of comprehensive plan of care as evidenced by provider attestation and co-signature Inter-disciplinary care team collaboration (see longitudinal plan of care) Evaluation of current treatment plan related to hypertension self management and patient's adherence to plan as established by provider. Reviewed medications with patient and discussed importance of compliance Educated patient on dietary and exercise recommendations, including restricting Sodium intake  Encourage continued use of home blood pressure monitoring and recording in blood pressure log; include symptoms of hypotension or potential medication side effects in log Discussed patient currently has home health coming in for wound care, reports blood pressure has been WNL Discussed plans with patient for ongoing care management follow up and provided patient with direct contact information for care management team Patient Goals/Self-Care Activities Self administers medications as prescribed Attends all scheduled provider appointments Calls provider office for new concerns, questions, or BP outside discussed parameters Checks BP and records as discussed Follows a low sodium diet/DASH diet - check blood pressure 3 times per week - write blood pressure results in a log or diary   Follow Up Plan: Telephone follow up appointment with care management team member scheduled for: 01/03/21    Care Plan : General Plan of Care (Adult)  Updates made by Lynne Logan, RN  since 11/29/2020 12:00 AM  Completed 12/03/2020   Problem: Quality of Life (General Plan of Care) Resolved 11/29/2020  Priority: High     Long-Range Goal: Quality of Life Maintained Completed 11/29/2020  Start Date: 02/01/2020  Expected End Date: 01/31/2021  Recent Progress: On track  Priority: High  Note:   Current Barriers:  Ineffective Self Health Maintenance Currently UNABLE TO independently self manage needs related to chronic health conditions.  Knowledge Deficits related to short term plan for care coordination needs and long term plans for chronic disease management needs Nurse Case Manager Clinical Goal(s):  Patient will work with care management team to address care coordination and chronic disease management needs related to Disease Management Educational Needs Care Coordination Medication Management and Education Psychosocial Support   Interventions:  11/29/20 completed successful outbound call with patient Determined patient completed the lab testing as recommended by PCP and voices having no further concerns at this time  Patient Goals/Self Care/Activities:  - discuss my treatment options with the doctor or nurse - learn something new by asking, reading and searching the Internet every day - make shared treatment decisions with doctor  - increase water intake to 64oz-80oz per day - complete full course of antibiotics as directed  - contact PCP for  reoccurring symptoms   Goal Met      Care Plan : Elective Surgery  Updates made by Lynne Logan, RN since 11/29/2020 12:00 AM  Completed 12/03/2020   Problem: Panniculectomy Procedure Resolved 11/29/2020  Priority: Medium     Long-Range Goal: Manage stretched out, excess fat and overhanging skin from your abdomen Completed 11/29/2020  Start Date: 02/29/2020  Expected End Date: 12/24/2020  Recent Progress: On track  Priority: Medium  Note:   Current Barriers:  Ineffective Self Health Maintenance Currently UNABLE TO  independently self manage needs related to chronic health conditions.  Knowledge Deficits related to short term plan for care coordination needs and long term plans for chronic disease management needs Nurse Case Manager Clinical Goal(s):  Patient will work with care management team to address care coordination and chronic disease management needs related to Disease Management Educational Needs Care Coordination Medication Management and Education Psychosocial Support   Interventions:  11/29/20 completed successful outbound call with patient   Collaboration with Glendale Chard, MD regarding development and update of comprehensive plan of care as evidenced by provider attestation and co-signature Inter-disciplinary care team collaboration (see longitudinal plan of care) Determined patient is home and recovering from her recent inpatient stay following complications secondary to completing a Panniculectomy Discussed patient is receiving wet to dry dressing changes with Silvadene twice weekly via [facility] (see care plan for new goal) Assessed for knowledge deficit related to s/s of infection and when to report symptoms Educated patient regarding signs/symptoms of infection and when to seek medical attention for such symptoms  Assessed for pain, patient reports minimal pain at this time and is taking her pain medication exactly as prescribed Educated on the importance to increase water to 64 oz daily and to eat a well balanced diet with adequate protein to help promote wound healing Educated on the importance to balance her activity with rest  Discussed plans with patient for ongoing care management follow up and provided patient with direct contact information for care management team Patient Self-Care Activities:  Keep the following appointments related to pre-op and post-op Panniculectomy 09/04/20-Pre-Op visit for Panniculectomy procedure (Dr. Marla Roe) 09/24/20-Lab visit for  Panniculectomy procedure 09/26/20-Panniculectomy outpatient/inpatient procedure  09/30/20-Post-Op Panniculectomy follow up appointment with Dr. Marla Roe  10/12/20-Post-Op Panniculectomy follow up appointments with Roetta Sessions, PA-C               Goal Met     Care Plan : Abdominal Wound  Updates made by Lynne Logan, RN since 11/29/2020 12:00 AM     Problem: Abdominal Wound   Priority: High     Long-Range Goal: Abdominal Wound complications prevented or minimized   Start Date: 11/29/2020  Expected End Date: 05/30/2021  This Visit's Progress: On track  Priority: High  Note:   Current Barriers:  Ineffective Self Health Maintenance in a patient with DM, CKD III, Hypertensive Nephropathy, Class 2 severe obesity, Anxiety, Panniculitis  Clinical Goal(s):  Collaboration with Glendale Chard, MD regarding development and update of comprehensive plan of care as evidenced by provider attestation and co-signature Inter-disciplinary care team collaboration (see longitudinal plan of care) patient will work with care management team to address care coordination and chronic disease management needs related to Disease Management Educational Needs Care Coordination Medication Management and Education Medication Reconciliation Psychosocial Support   Interventions:  11/29/20 completed successful outbound call with patient  Evaluation of current treatment plan related to  Abdominal Wound  ,  self-management and patient's adherence to  plan as established by provider. Collaboration with Glendale Chard, MD regarding development and update of comprehensive plan of care as evidenced by provider attestation       and co-signature Inter-disciplinary care team collaboration (see longitudinal plan of care) Provided education to patient about basic disease process for wound healing Review of patient status, including review of consultant's reports, relevant laboratory and other test results, and medications  completed. Reviewed medications with patient and discussed importance of medication adherence Determined patient is receiving wound care via [facility] twice weekly, wet to dry dressings with Silvadene, patient reports wound is healing as evidence the wound is getting smaller in size Assessed for pain and pain management, patient reports minimal pain, she is taking her pain medication as prescribed  Educated patient on signs/symptoms of infection to monitor for and report promptly for early treatment  Educated on the importance of eating a well balanced diet with adequate protein to help promote wound healing Educated on the risk for delayed wound healing secondary to Diabetes and uncontrolled blood glucose levels Discussed plans with patient for ongoing care management follow up and provided patient with direct contact information for care management team Self Care Activities:  Self administers medications as prescribed Attends all scheduled provider appointments Calls pharmacy for medication refills Calls provider office for new concerns or questions Patient Goals: - drink 64 oz of water daily - balance activity with rest - keep wound dressing clean and dry as directed by wound care nurse - eat a well balanced diet with adequate protein - keep blood sugars under good control - seek medical attention promptly for signs/symptoms suggestive infection   Follow Up Plan: Telephone follow up appointment with care management team member scheduled for: 01/03/21      Plan:Telephone follow up appointment with care management team member scheduled for:  01/03/21  Barb Merino, RN, BSN, CCM Care Management Coordinator [place] [facility]  Direct Phone: [phone]

## 2020-12-03 NOTE — Patient Instructions (Signed)
Visit Information  PATIENT GOALS:  Goals Addressed      Abdominal Wound complications prevented or minimized   On track    Timeframe:  Long-Range Goal Priority:  High Start Date:  11/29/20                           Expected End Date:  05/30/21  Follow up date: 01/03/21  Patient Goals: - drink 64 oz of water daily - balance activity with rest - keep wound dressing clean and dry as directed by wound care nurse - eat a well balanced diet with adequate protein - keep blood sugars under good control - seek medical attention promptly for signs/symptoms suggestive infection                            Monitor and Manage My Blood Sugar-Diabetes Type 2   On track    Timeframe:  Long-Range Goal Priority:  High Start Date: 02/01/20                            Expected End Date: 01/31/21                   Follow Up Date: 01/03/21   - check blood sugar at prescribed times - check blood sugar before and after exercise - check blood sugar if I feel it is too high or too low - enter blood sugar readings and medication or insulin into daily log - take the blood sugar log to all doctor visits - take the blood sugar meter to all doctor visits    Why is this important?   Checking your blood sugar at home helps to keep it from getting very high or very low.  Writing the results in a diary or log helps the doctor know how to care for you.  Your blood sugar log should have the time, date and the results.  Also, write down the amount of insulin or other medicine that you take.  Other information, like what you ate, exercise done and how you were feeling, will also be helpful.     Notes:      Obtain Eye Exam-Diabetes Type 2       Timeframe:  Long-Range Goal Priority:  Medium  Start Date: 02/01/20                            Expected End Date: 01/31/21    Follow Up Date: 01/03/21   - keep appointment with eye doctor - schedule appointment with eye doctor    Why is this important?   Eye  check-ups are important when you have diabetes.  Vision loss can be prevented.    Notes:      Perform Foot Care-Diabetes Type 2       Timeframe:  Long-Range Goal Priority:  Medium  Start Date: 02/01/20                            Expected End Date: 01/31/21                     Follow Up Date: 01/03/21   - check feet daily for cuts, sores or redness - do heel pump exercise 2 to 3 times each day -  keep feet up while sitting - trim toenails straight across - wash and dry feet carefully every day - wear comfortable, cotton socks - wear comfortable, well-fitting shoes    Why is this important?   Good foot care is very important when you have diabetes.  There are many things you can do to keep your feet healthy and catch a problem early.    Notes:         Track and Manage My Blood Pressure-Hypertension   On track    Timeframe:  Long-Range Goal Priority:  High Start Date: 02/01/20                            Expected End Date: 01/31/21                  Follow Up Date: 01/03/21   - check blood pressure 3 times per week - write blood pressure results in a log or diary  - adhere to dietary and exercise recommendations    Why is this important?   You won't feel high blood pressure, but it can still hurt your blood vessels.  High blood pressure can cause heart or kidney problems. It can also cause a stroke.  Making lifestyle changes like losing a Masiya Claassen weight or eating less salt will help.  Checking your blood pressure at home and at different times of the day can help to control blood pressure.  If the doctor prescribes medicine remember to take it the way the doctor ordered.  Call the office if you cannot afford the medicine or if there are questions about it.     Notes:      The patient verbalized understanding of instructions, educational materials, and care plan provided today and declined offer to receive copy of patient instructions, educational materials, and care plan.    Telephone follow up appointment with care management team member scheduled for: 01/03/21  Barb Merino, RN, BSN, CCM Care Management Coordinator [place] [facility]  Direct Phone: [phone]

## 2020-12-05 NOTE — Chronic Care Management (AMB) (Signed)
Chronic Care Management   CCM RN Visit Note  12/03/2020 Name: Teresa Golden MRN: [id number] DOB: June 18, 1945  Subjective: Teresa Golden is a 75 y.o. year old female who is a primary care patient of Glendale Chard, MD. The care management team was consulted for assistance with disease management and care coordination needs.    Engaged with patient by telephone for follow up visit in response to provider referral for case management and/or care coordination services.   Consent to Services:  The patient was given information about Chronic Care Management services, agreed to services, and gave verbal consent prior to initiation of services.  Please see initial visit note for detailed documentation.   Patient agreed to services and verbal consent obtained.   Assessment: Review of patient past medical history, allergies, medications, health status, including review of consultants reports, laboratory and other test data, was performed as part of comprehensive evaluation and provision of chronic care management services.   SDOH (Social Determinants of Health) assessments and interventions performed:    CCM Care Plan  Allergies  Allergen Reactions   Meloxicam Other (See Comments)    Fever; muscle aches; "flu-like" symptoms "Allergic," per [facility]   Other Other (See Comments)    "Rose fever and hay fever"    Outpatient Encounter Medications as of 12/03/2020  Medication Sig   acetaminophen (TYLENOL) 325 MG tablet Take 2 tablets (650 mg total) by mouth every 6 (six) hours as needed for moderate pain or mild pain.   amLODipine (NORVASC) 5 MG tablet Take 1 tablet (5 mg total) by mouth in the morning.   B Complex-C (B-COMPLEX WITH VITAMIN C) tablet Take 1 tablet by mouth daily.   Biotin 10000 MCG TABS Take 10,000 mcg by mouth daily.   Cholecalciferol 5000 units TABS Take 5,000 Units by mouth daily.   Cranberry-Vitamin C 250-60 MG CAPS Take 1 capsule by mouth daily.   diclofenac Sodium (VOLTAREN)  1 % GEL APPLY 2 GRAMS TO AFFECTED AREA(S) 4 TIMES DAILY AS NEEDED (Patient taking differently: Apply 2-4 g topically every 6 (six) hours as needed (to affected areas).)   gabapentin (NEURONTIN) 300 MG capsule Take 600 mg by mouth at bedtime.   glucose blood (ONETOUCH VERIO) test strip Use as instructed to check blood sugars 2 times per day dx: e11.65   hydrochlorothiazide (HYDRODIURIL) 25 MG tablet Take 1 tablet (25 mg total) by mouth daily.   insulin degludec (TRESIBA) 100 UNIT/ML FlexTouch Pen Inject 5 Units into the skin at bedtime.   metoprolol succinate (TOPROL-XL) 25 MG 24 hr tablet Take 1 tablet by mouth daily at bedtime   Multiple Vitamins-Minerals (MULTIVITAMIN WITH MINERALS) tablet Take 1 tablet by mouth daily. Women's 81+   OneTouch Delica Lancets 15B MISC Use as instructed to check blood sugars 2 times per day dx: e11.65   polyethylene glycol (MIRALAX / GLYCOLAX) 17 g packet Take 17 g by mouth daily.   Semaglutide, 1 MG/DOSE, (OZEMPIC, 1 MG/DOSE,) 4 MG/3ML SOPN Inject into the skin.   simvastatin (ZOCOR) 10 MG tablet Take 1 tablet (10 mg total) by mouth daily. (Patient taking differently: Take 10 mg by mouth at bedtime.)   SYNTHROID 112 MCG tablet Take 112 mcg by mouth daily before breakfast.   No facility-administered encounter medications on file as of 12/03/2020.    Patient Active Problem List   Diagnosis Date Noted   Infected wound 11/02/2020   Pressure injury of skin 10/22/2020   Wound infection after surgery 10/12/2020   Normocytic  anemia 10/12/2020   AKI (acute kidney injury) ([place]) 10/12/2020   Obesity (BMI 30-39.9) 10/12/2020   Sepsis ([place]) 10/12/2020   Type 2 diabetes mellitus with stage 3 chronic kidney disease, with long-term current use of insulin ([place]) 11/29/2019   Panniculitis 09/30/2019   Back pain 09/30/2019   Hepatoma ([place]) 01/21/2019   Other cirrhosis of liver ([place]) 07/05/2018   Hypertensive nephropathy 07/05/2018   Chronic renal disease, stage III ([place])  07/05/2018   Chronic left shoulder pain 07/05/2018   Hypothyroidism 02/06/2018   Hepatitis C virus infection cured after antiviral drug therapy 02/04/2018   Osteoarthritis of right knee 07/30/2017   S/P laparoscopic sleeve gastrectomy July 2018 08/26/2016   Preop cardiovascular exam 07/30/2016   Dyspnea on exertion 07/30/2016   Bilateral lower extremity edema 07/30/2016    Conditions to be addressed/monitored: DM, CKD III, Hypertensive Nephropathy, Class 2 severe obesity, Anxiety, Panniculitis, localized swelling of lower extremity  Care Plan : left LE DVT  Updates made by Lynne Logan, RN since 12/03/2020 12:00 AM     Problem: left LE DVT   Priority: High     Goal: left LE DVT complications prevented or minimized   Start Date: 11/30/2020  Expected End Date: 03/02/2021  Recent Progress: On track  Priority: High  Note:   Current Barriers:  Ineffective Self Health Maintenance in a patient with  DM, CKD III, Hypertensive Nephropathy, Class 2 severe obesity, Anxiety, Panniculitis, localized swelling of lower extremity Clinical Goal(s):  Collaboration with Glendale Chard, MD regarding development and update of comprehensive plan of care as evidenced by provider attestation and co-signature Inter-disciplinary care team collaboration (see longitudinal plan of care) patient will work with care management team to address care coordination and chronic disease management needs related to Disease Management Educational Needs Care Coordination Medication Management and Education Medication Reconciliation Psychosocial Support   Interventions:  11/30/20 completed inbound successful call with patient  Evaluation of current treatment plan related to  left LE DVT ,  self-management and patient's adherence to plan as established by provider. Collaboration with Glendale Chard, MD regarding development and update of comprehensive plan of care as evidenced by provider attestation       and  co-signature Inter-disciplinary care team collaboration (see longitudinal plan of care) Determined patient is calling today to discuss a new diagnosis for left LE DVT Per review of chart, noted the following update from 11/26/20:  Pt is aware that venous doppler is positive for DVT. She has picked up Xarelto samples of starter pack. Please contact pt to be sure she does not have any questions regarding meds (take as directed). Must stop Advil, ibuprofen etc for pain control. She may take Tylenol.  Assessed for patient understanding of the above instructions, patient veralizes understanding and is adhering to her prescribed regimen Reiterated to patient providing rationale the importance to stop taking Advil, Ibuprofen and or other NSAIDS she may be prescribed  Educated patient about signs/symptoms suggestive of PE and to call 911 is such symptoms occur Educated patient providing rationale not to massage calves or lower extremities until DVT has resolved  Discussed plans with patient for ongoing care management follow up and provided patient with direct contact information for care management team 12/03/20 completed inbound call with patient  Determined patient is calling to get clarification from Dr. Baird Cancer if resuming PT will be safe considering her recent dx: of left LE DVT Collaborated with Dr. Baird Cancer regarding patient concerns, determined Dr. Baird Cancer patient continue to receive light  PT to help with strengthening and endurance Informed patient providing rationale of Dr. Baird Cancer recommendations to resume light PT Determined patient is not feeling well today, she reports feeling weak and she tires very easily  Determined patient is not drinking enough water, she is only drinking sips Instructed patient to increase her water intake to 64 oz daily for adequate hydration and to help improve renal function  Instructed patient to balance her activity with plenty of rest and not to over exert herself,  reviewed and discussed activity restrictions including lifting restrictions per Dr. Marla Roe Re-educated on signs/symptoms of infection and when to call the doctor  Instructed patient to alert her PCP and or CCM team of any/all falls, bleeding gums, bloody stools and or bloody vomit, and or severe bruising  Instructed patient to monitor her temperature at home and to report fever >100.4 promptly, patient verbalizes understanding of all education/instructions provided today   Determined patient is scheduled for her next home visit today at 2:30 pm  Sent secure chat to Dr. Baird Cancer notifying her of patients reported symptoms of weakness and the education provided Discussed plans with patient for ongoing care management follow up and provided patient with direct contact information for care management team Self Care Activities:  Self administers medications as prescribed Attends all scheduled provider appointments Calls pharmacy for medication refills Calls provider office for new concerns or questions Patient Goals: - take Xarelto exactly as directed for treatment of left LE DVT - stop taking Advil, Ibuprofen and all other NSAIDs while taking Xarelto  - call 911 for symptoms suggestive of PE as discussed - report any/all falls to PCP and or CCM team promptly - resume in home PT as directed - report symptoms of infection and or bleeding gums, bleeding gums while brushing teeth, bloody stools or vomit and or severe bruising promptly   Follow Up Plan: Telephone follow up appointment with care management team member scheduled for: 12/31/20     Plan:Telephone follow up appointment with care management team member scheduled for:  12/31/20  Barb Merino, RN, BSN, CCM Care Management Coordinator [place] [facility]  Direct Phone: [phone]

## 2020-12-05 NOTE — Chronic Care Management (AMB) (Signed)
Chronic Care Management   CCM RN Visit Note  11/30/2020 Name: Teresa Golden MRN: [id number] DOB: December 23, 1945  Subjective: Teresa Golden is a 75 y.o. year old female who is a primary care patient of Glendale Chard, MD. The care management team was consulted for assistance with disease management and care coordination needs.    Engaged with patient by telephone for follow up visit in response to provider referral for case management and/or care coordination services.   Consent to Services:  The patient was given information about Chronic Care Management services, agreed to services, and gave verbal consent prior to initiation of services.  Please see initial visit note for detailed documentation.   Patient agreed to services and verbal consent obtained.   Assessment: Review of patient past medical history, allergies, medications, health status, including review of consultants reports, laboratory and other test data, was performed as part of comprehensive evaluation and provision of chronic care management services.   SDOH (Social Determinants of Health) assessments and interventions performed:    CCM Care Plan  Allergies  Allergen Reactions   Meloxicam Other (See Comments)    Fever; muscle aches; "flu-like" symptoms "Allergic," per [facility]   Other Other (See Comments)    "Rose fever and hay fever"    Outpatient Encounter Medications as of 11/30/2020  Medication Sig   acetaminophen (TYLENOL) 325 MG tablet Take 2 tablets (650 mg total) by mouth every 6 (six) hours as needed for moderate pain or mild pain.   amLODipine (NORVASC) 5 MG tablet Take 1 tablet (5 mg total) by mouth in the morning.   B Complex-C (B-COMPLEX WITH VITAMIN C) tablet Take 1 tablet by mouth daily.   Biotin 10000 MCG TABS Take 10,000 mcg by mouth daily.   Cholecalciferol 5000 units TABS Take 5,000 Units by mouth daily.   Cranberry-Vitamin C 250-60 MG CAPS Take 1 capsule by mouth daily.   diclofenac Sodium (VOLTAREN) 1  % GEL APPLY 2 GRAMS TO AFFECTED AREA(S) 4 TIMES DAILY AS NEEDED (Patient taking differently: Apply 2-4 g topically every 6 (six) hours as needed (to affected areas).)   gabapentin (NEURONTIN) 300 MG capsule Take 600 mg by mouth at bedtime.   glucose blood (ONETOUCH VERIO) test strip Use as instructed to check blood sugars 2 times per day dx: e11.65   hydrochlorothiazide (HYDRODIURIL) 25 MG tablet Take 1 tablet (25 mg total) by mouth daily.   insulin degludec (TRESIBA) 100 UNIT/ML FlexTouch Pen Inject 5 Units into the skin at bedtime.   metoprolol succinate (TOPROL-XL) 25 MG 24 hr tablet Take 1 tablet by mouth daily at bedtime   Multiple Vitamins-Minerals (MULTIVITAMIN WITH MINERALS) tablet Take 1 tablet by mouth daily. Women's 02+   OneTouch Delica Lancets 77A MISC Use as instructed to check blood sugars 2 times per day dx: e11.65   polyethylene glycol (MIRALAX / GLYCOLAX) 17 g packet Take 17 g by mouth daily.   Semaglutide, 1 MG/DOSE, (OZEMPIC, 1 MG/DOSE,) 4 MG/3ML SOPN Inject into the skin.   simvastatin (ZOCOR) 10 MG tablet Take 1 tablet (10 mg total) by mouth daily. (Patient taking differently: Take 10 mg by mouth at bedtime.)   SYNTHROID 112 MCG tablet Take 112 mcg by mouth daily before breakfast.   No facility-administered encounter medications on file as of 11/30/2020.    Patient Active Problem List   Diagnosis Date Noted   Infected wound 11/02/2020   Pressure injury of skin 10/22/2020   Wound infection after surgery 10/12/2020   Normocytic  anemia 10/12/2020   AKI (acute kidney injury) ([place]) 10/12/2020   Obesity (BMI 30-39.9) 10/12/2020   Sepsis ([place]) 10/12/2020   Type 2 diabetes mellitus with stage 3 chronic kidney disease, with long-term current use of insulin ([place]) 11/29/2019   Panniculitis 09/30/2019   Back pain 09/30/2019   Hepatoma ([place]) 01/21/2019   Other cirrhosis of liver ([place]) 07/05/2018   Hypertensive nephropathy 07/05/2018   Chronic renal disease, stage III ([place])  07/05/2018   Chronic left shoulder pain 07/05/2018   Hypothyroidism 02/06/2018   Hepatitis C virus infection cured after antiviral drug therapy 02/04/2018   Osteoarthritis of right knee 07/30/2017   S/P laparoscopic sleeve gastrectomy July 2018 08/26/2016   Preop cardiovascular exam 07/30/2016   Dyspnea on exertion 07/30/2016   Bilateral lower extremity edema 07/30/2016    Conditions to be addressed/monitored: DM, CKD III, Hypertensive Nephropathy, Class 2 severe obesity, Anxiety, Panniculitis, localized swelling of lower extremity  Care Plan : left LE DVT  Updates made by Lynne Logan, RN since 11/30/2020 12:00 AM     Problem: left LE DVT   Priority: High     Goal: left LE DVT complications prevented or minimized   Start Date: 11/30/2020  Expected End Date: 03/02/2021  This Visit's Progress: On track  Priority: High  Note:   Current Barriers:  Ineffective Self Health Maintenance in a patient with  DM, CKD III, Hypertensive Nephropathy, Class 2 severe obesity, Anxiety, Panniculitis, localized swelling of lower extremity Clinical Goal(s):  Collaboration with Glendale Chard, MD regarding development and update of comprehensive plan of care as evidenced by provider attestation and co-signature Inter-disciplinary care team collaboration (see longitudinal plan of care) patient will work with care management team to address care coordination and chronic disease management needs related to Disease Management Educational Needs Care Coordination Medication Management and Education Medication Reconciliation Psychosocial Support   Interventions:  11/30/20 completed inbound successful call with patient  Evaluation of current treatment plan related to  left LE DVT ,  self-management and patient's adherence to plan as established by provider. Collaboration with Glendale Chard, MD regarding development and update of comprehensive plan of care as evidenced by provider attestation       and  co-signature Inter-disciplinary care team collaboration (see longitudinal plan of care) Determined patient is calling today to discuss a new diagnosis for left LE DVT Per review of chart, noted the following update from 11/26/20:  Pt is aware that venous doppler is positive for DVT. She has picked up Xarelto samples of starter pack. Please contact pt to be sure she does not have any questions regarding meds (take as directed). Must stop Advil, ibuprofen etc for pain control. She may take Tylenol.  Assessed for patient understanding of the above instructions, patient veralizes understanding and is adhering to her prescribed regimen Reiterated to patient providing rationale the importance to stop taking Advil, Ibuprofen and or other NSAIDS she may be prescribed  Educated patient about signs/symptoms suggestive of PE and to call 911 is such symptoms occur Educated patient providing rationale not to massage calves or lower extremities until DVT has resolved  Discussed plans with patient for ongoing care management follow up and provided patient with direct contact information for care management team Self Care Activities:  Self administers medications as prescribed Attends all scheduled provider appointments Calls pharmacy for medication refills Calls provider office for new concerns or questions Patient Goals: - take Xarelto exactly as directed for treatment of left LE DVT - stop taking Advil,  Ibuprofen and all other NSAIDs while taking Xarelto  - call 911 for symptoms suggestive of PE as discussed   Follow Up Plan: Telephone follow up appointment with care management team member scheduled for: 12/31/20      Plan:Telephone follow up appointment with care management team member scheduled for:  12/31/20  Barb Merino, RN, BSN, CCM Care Management Coordinator [place] [facility]  Direct Phone: [phone]

## 2020-12-06 ENCOUNTER — Ambulatory Visit (INDEPENDENT_AMBULATORY_CARE_PROVIDER_SITE_OTHER): Payer: Medicare HMO | Admitting: Addiction (Substance Use Disorder)

## 2020-12-06 ENCOUNTER — Telehealth: Payer: Self-pay

## 2020-12-06 DIAGNOSIS — F4323 Adjustment disorder with mixed anxiety and depressed mood: Secondary | ICD-10-CM | POA: Diagnosis not present

## 2020-12-06 NOTE — Progress Notes (Signed)
[facility] Counselor/Therapist Progress Note  Patient ID: RYELEE ALBEE, MRN: [id number],    Date: 12/06/2020   Time Spent:  4mins  Treatment Type: Individual Therapy  Reported Symptoms: tired & ready to get back to life  Mental Status Exam:  Appearance:   Well Groomed     Behavior:  Appropriate and Sharing  Motor:  Normal  Speech/Language:   Clear and Coherent and Normal Rate  Affect:  Appropriate  Mood:  normal and upset  Thought process:  normal  Thought content:    Rumination  Sensory/Perceptual disturbances:    WNL  Orientation:  x4  Attention:  Good  Concentration:  Good  Memory:  WNL  Fund of knowledge:   Good  Insight:    Good  Judgment:   Fair  Impulse Control:  Good   Risk Assessment: Danger to Self:  No Self-injurious Behavior: No Danger to Others: No Duty to Warn:no Physical Aggression / Violence:No  Access to Firearms a concern: No  Gang Involvement:No   Virtual Visit via TELEPHONE : I connected with client by telephone, with their informed consent, and verified client privacy and that I am speaking with the correct person using two identifiers. I discussed the limitations, risks, security and privacy concerns of performing psychotherapy and management service virtually and confirmed their location. I also discussed with the patient that there may be a patient responsible charge related to this service and to confirm with the front desk if their insurance covers teletherapy. I also discussed with the patient the availability of in person appointments. The patient expressed understanding and agreed to proceed. I discussed the treatment planning with the client. The client was provided an opportunity to ask questions and all were answered. The client agreed with the plan and demonstrated an understanding of the instructions. The client was advised to call our office if symptoms worsen or feel they are in a crisis state and need immediate contact. Client  also reminded of a crisis line number and to use 9-1-1 if there's an emergency.  Therapist Location: office; Client Location: home  Subjective: Client reported feeling very tired and worn out physically but having some ambitions to get back to being a bit more social or active. Client very thankful her wound is finally healing much better and client reported struggling with the regret of even getting the surgery due to the traumatic surgeries she had due to infection. Client expressed her frustration with not feeling fully educated about the complications of getting the surgery. Client processed her emotions and therapist used Mi & CBT with client to validate her feelings and help her process her regret without letting her dwell on something she cant change that makes her upset. Therapist assessed for stability and client denied SI/HI/AVH.   Interventions: Cognitive Behavioral Therapy, Motivational Interviewing, and RPT    Diagnosis:   ICD-10-CM   1. Adjustment disorder with mixed anxiety and depressed mood  F43.23       Plan of Care:  Client is to return to therapy with therapist every 1-2 weeks as needed to process traumas/ frustrations in a safe space, to be re-evaluated in 3 months.  Client is to practice mindfulness AEB daily meditation and body scans or as needed when flooded by emotion/pain.  Client is to learn/practice DBT wise mind & radical acceptance. Client is to practice self-compassion AEB being gentle with themselves, utilizing self-care techniques daily or as needed when grieving something in the moment.  Client  is to process grief/pain of their loss in a somatic body-felt sense way: ie using mindfulness, brainspotting, or trauma release as a method for releasing body pain/tension caused by grief.  Barnie Del, LCSW, LCAS, CCTP, CCS-I, BSP

## 2020-12-06 NOTE — Telephone Encounter (Signed)
Pt has PT on HOLD.  Provider spoke with PT - states thinks pt overwhelmed w/ abdominal surgery. Pt wants to hold PT for two weeks. Pt encouraged to walk around her home as tolerated. Provider encouraged PT to reach back out to pt in two weeks.   RS

## 2020-12-07 ENCOUNTER — Ambulatory Visit: Payer: Medicare HMO

## 2020-12-07 DIAGNOSIS — Z794 Long term (current) use of insulin: Secondary | ICD-10-CM

## 2020-12-07 DIAGNOSIS — E1122 Type 2 diabetes mellitus with diabetic chronic kidney disease: Secondary | ICD-10-CM

## 2020-12-07 DIAGNOSIS — N182 Chronic kidney disease, stage 2 (mild): Secondary | ICD-10-CM

## 2020-12-07 DIAGNOSIS — N183 Chronic kidney disease, stage 3 unspecified: Secondary | ICD-10-CM

## 2020-12-07 NOTE — Chronic Care Management (AMB) (Signed)
Chronic Care Management    Social Work Note  12/07/2020 Name: Teresa Golden MRN: [id number] DOB: [birth year]  Teresa Golden is a 75 y.o. year old female who is a primary care patient of Glendale Chard, MD. The CCM team was consulted to assist the patient with chronic disease management and/or care coordination needs related to:  DM II, CKD III, HTN .   Engaged with patient by telephone for follow up visit in response to provider referral for social work chronic care management and care coordination services.   Consent to Services:  The patient was given information about Chronic Care Management services, agreed to services, and gave verbal consent prior to initiation of services.  Please see initial visit note for detailed documentation.   Patient agreed to services and consent obtained.   Assessment: Review of patient past medical history, allergies, medications, and health status, including review of relevant consultants reports was performed today as part of a comprehensive evaluation and provision of chronic care management and care coordination services.     SDOH (Social Determinants of Health) assessments and interventions performed:    Advanced Directives Status: Not addressed in this encounter.  CCM Care Plan  Allergies  Allergen Reactions   Meloxicam Other (See Comments)    Fever; muscle aches; "flu-like" symptoms "Allergic," per [facility]   Other Other (See Comments)    "Rose fever and hay fever"    Outpatient Encounter Medications as of 12/07/2020  Medication Sig   acetaminophen (TYLENOL) 325 MG tablet Take 2 tablets (650 mg total) by mouth every 6 (six) hours as needed for moderate pain or mild pain.   amLODipine (NORVASC) 5 MG tablet Take 1 tablet (5 mg total) by mouth in the morning.   B Complex-C (B-COMPLEX WITH VITAMIN C) tablet Take 1 tablet by mouth daily.   Biotin 10000 MCG TABS Take 10,000 mcg by mouth daily.   Cholecalciferol 5000 units TABS Take 5,000 Units by  mouth daily.   colchicine 0.6 MG tablet Take 1 tablet (0.6 mg total) by mouth daily for 7 days.   Cranberry-Vitamin C 250-60 MG CAPS Take 1 capsule by mouth daily.   diclofenac Sodium (VOLTAREN) 1 % GEL APPLY 2 GRAMS TO AFFECTED AREA(S) 4 TIMES DAILY AS NEEDED (Patient taking differently: Apply 2-4 g topically every 6 (six) hours as needed (to affected areas).)   gabapentin (NEURONTIN) 300 MG capsule Take 600 mg by mouth at bedtime.   glucose blood (ONETOUCH VERIO) test strip Use as instructed to check blood sugars 2 times per day dx: e11.65   hydrochlorothiazide (HYDRODIURIL) 25 MG tablet Take 1 tablet (25 mg total) by mouth daily.   insulin degludec (TRESIBA) 100 UNIT/ML FlexTouch Pen Inject 5 Units into the skin at bedtime.   metoprolol succinate (TOPROL-XL) 25 MG 24 hr tablet Take 1 tablet by mouth daily at bedtime   Multiple Vitamins-Minerals (MULTIVITAMIN WITH MINERALS) tablet Take 1 tablet by mouth daily. Women's 83+   OneTouch Delica Lancets 41D MISC Use as instructed to check blood sugars 2 times per day dx: e11.65   polyethylene glycol (MIRALAX / GLYCOLAX) 17 g packet Take 17 g by mouth daily.   Semaglutide, 1 MG/DOSE, (OZEMPIC, 1 MG/DOSE,) 4 MG/3ML SOPN Inject into the skin.   simvastatin (ZOCOR) 10 MG tablet Take 1 tablet (10 mg total) by mouth daily. (Patient taking differently: Take 10 mg by mouth at bedtime.)   SYNTHROID 112 MCG tablet Take 112 mcg by mouth daily before breakfast.   [DISCONTINUED]  SEMGLEE, YFGN, 100 UNIT/ML Pen Inject 9 Units into the skin at bedtime.   No facility-administered encounter medications on file as of 12/07/2020.    Patient Active Problem List   Diagnosis Date Noted   Infected wound 11/02/2020   Pressure injury of skin 10/22/2020   Wound infection after surgery 10/12/2020   Normocytic anemia 10/12/2020   AKI (acute kidney injury) ([place]) 10/12/2020   Obesity (BMI 30-39.9) 10/12/2020   Sepsis ([place]) 10/12/2020   Type 2 diabetes mellitus with stage  3 chronic kidney disease, with long-term current use of insulin ([place]) 11/29/2019   Panniculitis 09/30/2019   Back pain 09/30/2019   Hepatoma ([place]) 01/21/2019   Other cirrhosis of liver ([place]) 07/05/2018   Hypertensive nephropathy 07/05/2018   Chronic renal disease, stage III ([place]) 07/05/2018   Chronic left shoulder pain 07/05/2018   Hypothyroidism 02/06/2018   Hepatitis C virus infection cured after antiviral drug therapy 02/04/2018   Osteoarthritis of right knee 07/30/2017   S/P laparoscopic sleeve gastrectomy July 2018 08/26/2016   Preop cardiovascular exam 07/30/2016   Dyspnea on exertion 07/30/2016   Bilateral lower extremity edema 07/30/2016    Conditions to be addressed/monitored: HTN, DMII, and CKD Stage III  Care Plan : Social Work Care Plan  Updates made by Daneen Schick since 12/07/2020 12:00 AM     Problem: Care Coordination      Long-Range Goal: Collaborate with RN Care Manager to perform appropriate assessments to assist with care coordination needs   Start Date: 03/02/2020  Recent Progress: On track  Priority: Low  Note:   Current Barriers:  Chronic conditions including DM II, CKD III, and HTN which put patient at increased risk of hospitalization Recent hospitalization due to wound infection port panniculectomy  Limited access to a caregiver  Social Work Clinical Goal(s):  patient will work with SW to address concerns related to DM II, CKD III, and HTN and manage care coordination needs Patient will follow the recommendations of her provider as evidenced by remaining in rehab until appropriate for discharge completed New: Patient will follow up with her primary care provider regarding concerns with hearing  Interventions: 1:1 collaboration with Glendale Chard, MD regarding development and update of comprehensive plan of care as evidenced by provider attestation and co-signature Inter-disciplinary care team collaboration (see longitudinal plan of care) Inbound  call received from the patient who reports she is doing well in the home continuing to work with therapy Discussed the patient has concerns her hearing has changed since her recent abdominal surgery - patient is questioning if she should see an audiologist Assessed for hearing changes - patient reports she sometimes hears a roaring noise within her ear as if she has water in it or if she is in a tunnel Determined this can sometimes be relieved by patient turning her head and/or opening her mouth wide as if to relieve pressure Performed chart review to note patient has an [place] appointment to see her primary provider on 11/1 Advised the patient SW would collaborate with Dr. Baird Cancer to advise of this hearing change so she may follow up with the patient during her office visit or sooner if deemed appropriate  Patient Goals/Self-Care Activities patient will:  -Follow up with Dr. Baird Cancer regarding changes in hearing -Contact SW as needed prior to next scheduled appointment  Follow up Plan: SW will follow up with patient by phone over the next 21 days     Problem: Mobility and Independence      Goal: Mobility  and Independence Optimized   Start Date: 11/09/2020  Expected End Date: 12/24/2020  Recent Progress: On track  Priority: High  Note:   Current Barriers:  Chronic disease management support and education needs related to HTN, DM, and CKD Stage III   Recent hospitalization due to wound infection following panniculectomy Transportation Social Worker Clinical Goal(s):  patient will work with SW to identify and address any acute and/or chronic care coordination needs related to the self health management of HTN, DM, and CKD Stage III   patient will work with SW to address concerns related to care coordination needs post hospitalization  SW Interventions:  Inter-disciplinary care team collaboration (see longitudinal plan of care) Collaboration with Glendale Chard, MD regarding  development and update of comprehensive plan of care as evidenced by provider attestation and co-signature Inbound call received from the patient to discuss care coordination needs Patient had questions regarding [facility] program - questions answered and patient is aware she may access this service as needed for any [place] office visits Discussed the patient was seen in the home by home health social worker and discussed the need for a grab bar to help her get out of bed easier Determined the patient continues to work with PT and OT Advised the patient to speak with her therapy team regarding limited ability to get out of bed following abdominal surgery Provided verbal education to the patient surrounding the therapists ability to assess for appropriate equipment needs and obtain orders from her primary care provider - patient stated understanding  Patient Goals/Self-Care Activities patient will:   - Speak with PT/OT to assess for in home equipment needs -Contact [facility] as needed to schedule transportation for upcomming appointments -Contact SW as needed  Follow Up Plan:  SW will follow up with the patient over the next 30 days       Follow Up Plan: SW will follow up with patient by phone over the next month      Daneen Schick, BSW, CDP Social Worker, Certified Dementia Practitioner [place] [facility] [place] [facility] [phone]

## 2020-12-07 NOTE — Patient Instructions (Signed)
Social Worker Visit Information  Goals we discussed today:   Goals Addressed             This Visit's Progress    Mobility and Independence Optimized       Timeframe:  Short-Term Goal Priority:  High Start Date:  9.16.22                           Expected End Date:    10.31.22                   Next planned outreach: 11.2.22  Patient Goals/Self-Care Activities patient will:   - Speak with PT/OT to assess for in home equipment needs -Contact [facility] as needed to schedule transportation for upcomming appointments -Contact SW as needed     Work with SW to manage care coordination needs       Timeframe:  Long-Range Goal Priority:  [employer] Start Date:   12.16.21                                           Next date of contact: 11.2.22  Patient Goals/Self-Care Activities patient will:  -Follow up with Dr. Baird Cancer regarding changes in hearing -Contact SW as needed prior to next scheduled appointment         Materials Provided: Verbal education about transportation program provided by phone  Patient verbalizes understanding of instructions provided today and agrees to view in [place].   Follow Up Plan: SW will follow up with patient by phone over the next month   Daneen Schick, BSW, CDP Social Worker, Certified Dementia Practitioner [place] [facility] [place] [facility] [phone]

## 2020-12-10 ENCOUNTER — Telehealth: Payer: Self-pay | Admitting: Plastic Surgery

## 2020-12-10 NOTE — Telephone Encounter (Signed)
Teresa Golden from [facility] (Called from [phone]) called to give an update regarding Teresa Golden's wound. Wanted to make sure Dr. Marla Roe was aware:  Saturday, 10/15 Teresa Golden contacted On Call stating that she was running 100.3 temp, was in pain, feeling chilled and achy.Nurse recommended taking tylenol. Monday, 10/17 Teresa Golden saw Teresa Golden and retook temp which was 98.9. Teresa Golden said that the pain tended to be more on the right side and where the sutures are. Wound had drainage but nothing green of color. Teresa Golden was concerned about the new odor coming from the wound and just wanted to make sure medical staff was aware of the spiked fever of 100.3 on Saturday.

## 2020-12-10 NOTE — Telephone Encounter (Signed)
Please review. Just an fyi. Please let us know if you would like [place] to f/u with pt. Thank you!

## 2020-12-11 NOTE — Progress Notes (Signed)
Patient is a 75 year old female with PMH of type II DM, HTN, CKD, and recent hospitalizations x2 for wound infection secondary to panniculectomy performed 09/26/2020 by Dr. Marla Roe.  She was discharged from the hospital most recently on 11/06/2020 after management with IV vancomycin and cefepime.  She was discharged home with continued Keflex and home health agency.  Plan was for wet-to-dry dressings 3 times weekly and has has been changed to Aquacel Ag 3 times weekly.  Patient was seen most recently on 11/23/2020.  At that time, it was noted that her surgical site was coming together nicely and there was no evidence of infection.  Plan was for continued dressing changes via her home health team.  Plan was for follow-up in 2 to 3 weeks.  Today, she is dressed up nicely and quite pleased with her progress.  She states that she is doing excellent.  She has a nurse who changes her dressings MWF.  She reports that she uses a silver corded dressing with good effect.  I inquired about the pressure ulcer that was noted previously and she states that it has completely resolved.  I also inquired about the reported fever that she stated was a result of her having to push a grocery cart given lack of any riding carts available which caused her to feel fatigued.  She states that she quickly bounced back and has not had any fevers, chills, or other systemic symptoms since.  She reports that she is doing well from a pain standpoint as well.  She states that she is cooking and going shopping today.  She also states that her odor has improved considerably.  Physical exam largely reassuring.  She continues to have open wounds along the incision, but they appear to be healing well and are healthy appearing.  No redness, induration, or other signs otherwise concerning for infection.  No areas of crepitus or fluctuance.  No swelling.  Umbilicus healing well.  No significant odor appreciated on exam.  We cleaned here with saline  and gauze.  Dressed with Xeroform followed by ABD pads and secured with tape.  Patient to follow-up here in office in 2 to 3 weeks.  Recommended continued dressing changes MWF.  Patient knows to call the clinic should she develop any new or worsening symptoms.

## 2020-12-12 ENCOUNTER — Telehealth: Payer: Self-pay

## 2020-12-12 NOTE — Chronic Care Management (AMB) (Signed)
   Teresa Golden was reminded to have all medications, supplements and any blood glucose and blood pressure readings available for review with [name], Pharm. D, at her telephone visit on 12-13-2020 at 2:00.   Questions: Have you had any recent office visit or specialist visit outside of [place]? Patient states no  Are there any concerns you would like to discuss during your office visit? Patient states she is taking a blood thinner, xarelto due to blood clots and wants to know it she has to stay away from leafy greens.  Are you having any problems obtaining your medications? (Whether it pharmacy issues or cost) Patient stated no  If patient has any PAP medications ask if they are having any problems getting their PAP medication or refill? Patient stated no.  Care Gaps: Covid booster overdue last completed 07-06-2020 PNA vaccine overdue last completed 11-24-2016  AWV 10-10-2021  Star Rating Drug: Losartan 100 mg- Last filled 08-31-2020 90 DS [facility] (Patient has pills left until refill arrives) Simvastatin 10 mg- Last filled 08-31-2020 90 DS [facility] (Patient has pills left until refill arrives) Ozempic 1 mg- Patient assistance  Any gaps in medications fill history? No  [place] Pharmacist Assistant [phone]

## 2020-12-13 ENCOUNTER — Ambulatory Visit: Payer: Medicare HMO

## 2020-12-13 DIAGNOSIS — E7800 Pure hypercholesterolemia, unspecified: Secondary | ICD-10-CM

## 2020-12-13 DIAGNOSIS — I129 Hypertensive chronic kidney disease with stage 1 through stage 4 chronic kidney disease, or unspecified chronic kidney disease: Secondary | ICD-10-CM

## 2020-12-13 NOTE — Progress Notes (Signed)
Chronic Care Management Pharmacy Note  12/20/2020 Name:  Teresa Golden MRN:  [id number] DOB:  10-Jan-1946  Summary: Patient reports that she is doing much better.    Recommendations/Changes made from today's visit: Recommend patient continue current medication regimen.   Plan: Patient reports that she is taking her medication everyday.    Subjective: Teresa Golden is an 75 y.o. year old female who is a primary patient of Glendale Chard, MD.  The CCM team was consulted for assistance with disease management and care coordination needs.    Engaged with patient by telephone for follow up visit in response to provider referral for pharmacy case management and/or care coordination services.   Consent to Services:  The patient was given information about Chronic Care Management services, agreed to services, and gave verbal consent prior to initiation of services.  Please see initial visit note for detailed documentation.   Patient Care Team: Glendale Chard, MD as PCP - General (Internal Medicine) Elouise Munroe, MD as PCP - Cardiology (Cardiology) Daneen Schick as Social Worker [place], Claudette Stapler, RN as Case Manager Mayford Knife, [facility] (Pharmacist)  Recent office visits: 11/22/2020 PCP OV   Recent consult visits: 12/14/2020 Plastic Surgery consult visit  11/23/2020 Wound infection consult  11/13/2020 Plastic surgery consult   Hospital visits: 11/02/2020 Hospital visit   Objective:  Lab Results  Component Value Date   CREATININE 0.89 11/06/2020   BUN 9 11/06/2020   GFRNONAA >60 11/06/2020   GFRAA 67 11/29/2019   NA 143 11/06/2020   K 3.6 11/06/2020   CALCIUM 9.2 11/06/2020   CO2 25 11/06/2020   GLUCOSE 75 11/06/2020    Lab Results  Component Value Date/Time   HGBA1C 6.4 (H) 09/18/2020 08:56 AM   HGBA1C 6.2 (H) 05/31/2020 10:41 AM   MICROALBUR 10 02/09/2020 10:59 AM   MICROALBUR 10 01/17/2019 06:09 PM    Last diabetic Eye exam:  Lab Results  Component  Value Date/Time   HMDIABEYEEXA No Retinopathy 01/11/2020 12:00 AM    Last diabetic Foot exam: No results found for: HMDIABFOOTEX   Lab Results  Component Value Date   CHOL 150 02/09/2020   HDL 61 02/09/2020   LDLCALC 77 02/09/2020   TRIG 55 02/09/2020   CHOLHDL 2.5 02/09/2020    Hepatic Function Latest Ref Rng & Units 11/03/2020 11/02/2020 10/29/2020  Total Protein 6.5 - 8.1 g/dL 6.0(L) 6.4(L) 5.7(L)  Albumin 3.5 - 5.0 g/dL 2.4(L) 2.6(L) 2.4(L)  AST 15 - 41 U/L '16 17 15  ' ALT 0 - 44 U/L '9 11 11  ' Alk Phosphatase 38 - 126 U/L 62 65 59  Total Bilirubin 0.3 - 1.2 mg/dL 0.5 0.7 0.6    Lab Results  Component Value Date/Time   TSH 4.574 (H) 10/12/2020 09:31 PM   TSH 2.840 05/31/2020 10:41 AM   TSH 1.230 07/05/2018 11:17 AM   FREET4 1.54 07/05/2018 11:17 AM    CBC Latest Ref Rng & Units 11/06/2020 11/05/2020 11/04/2020  WBC 4.0 - 10.5 K/uL 5.1 5.1 5.1  Hemoglobin 12.0 - 15.0 g/dL 7.6(L) 7.7(L) 7.6(L)  Hematocrit 36.0 - 46.0 % 23.3(L) 23.4(L) 23.1(L)  Platelets 150 - 400 K/uL 211 203 202    Lab Results  Component Value Date/Time   VD25OH 81.3 02/09/2020 02:43 PM    Clinical ASCVD: No  The 10-year ASCVD risk score (Arnett DK, et al., 2019) is: 51.1%   Values used to calculate the score:     Age: 23 years  Sex: Female     Is Non-Hispanic African American: Yes     Diabetic: Yes     Tobacco smoker: Yes     Systolic Blood Pressure: 582 mmHg     Is BP treated: Yes     HDL Cholesterol: 61 mg/dL     Total Cholesterol: 150 mg/dL    Depression screen [facility] 2/9 09/20/2020 03/15/2020 08/18/2019  Decreased Interest 0 0 0  Down, Depressed, Hopeless 0 2 0  PHQ - 2 Score 0 2 0  Altered sleeping - 1 0  Tired, decreased energy - 2 3  Change in appetite - 0 0  Feeling bad or failure about yourself  - 2 0  Trouble concentrating - 0 0  Moving slowly or fidgety/restless - 0 0  Suicidal thoughts - 0 0  PHQ-9 Score - 7 3  Difficult doing work/chores - - Not difficult at all  Some recent data  might be hidden    Social History   Tobacco Use  Smoking Status Former   Packs/day: 0.25   Years: 10.00   Pack years: 2.50   Types: Cigarettes  Smokeless Tobacco Never  Tobacco Comments   quit 20 years ago   BP Readings from Last 3 Encounters:  11/22/20 134/68  11/07/20 (!) 157/61  10/30/20 (!) 163/59   Pulse Readings from Last 3 Encounters:  11/22/20 84  11/07/20 (!) 59  10/30/20 70   Wt Readings from Last 3 Encounters:  11/22/20 200 lb 3.2 oz (90.8 kg)  11/06/20 197 lb (89.4 kg)  10/29/20 214 lb 1.1 oz (97.1 kg)   BMI Readings from Last 3 Encounters:  11/22/20 33.32 kg/m  11/06/20 32.78 kg/m  10/29/20 35.62 kg/m    Assessment/Interventions: Review of patient past medical history, allergies, medications, health status, including review of consultants reports, laboratory and other test data, was performed as part of comprehensive evaluation and provision of chronic care management services.   SDOH:  (Social Determinants of Health) assessments and interventions performed: No  SDOH Screenings   Alcohol Screen: Not on file  Depression (PHQ2-9): Low Risk    PHQ-2 Score: 0  Financial Resource Strain: Low Risk    Difficulty of Paying Living Expenses: Not hard at all  Food Insecurity: No Food Insecurity   Worried About Charity fundraiser in the Last Year: Never true   Ran Out of Food in the Last Year: Never true  Housing: Not on file  Physical Activity: Insufficiently Active   Days of Exercise per Week: 3 days   Minutes of Exercise per Session: 40 min  Social Connections: Not on file  Stress: No Stress Concern Present   Feeling of Stress : Not at all  Tobacco Use: Medium Risk   Smoking Tobacco Use: Former   Smokeless Tobacco Use: Never   Passive Exposure: Not on file  Transportation Needs: No Transportation Needs   Lack of Transportation (Medical): No   Lack of Transportation (Non-Medical): No    CCM Care Plan  Allergies  Allergen Reactions    Meloxicam Other (See Comments)    Fever; muscle aches; "flu-like" symptoms "Allergic," per [facility]   Other Other (See Comments)    "Rose fever and hay fever"    Medications Reviewed Today     Reviewed by Windy Canny, CMA (Certified Medical Assistant) on 12/14/20 at [place] List Status: <None>   Medication Order Taking? Sig Documenting Provider Last Dose Status Informant  acetaminophen (TYLENOL) 325 MG tablet [id number] Yes Take  2 tablets (650 mg total) by mouth every 6 (six) hours as needed for moderate pain or mild pain. Arrien, Jimmy Picket, MD Taking Active Nursing Home Medication Administration Guide (MAG)  amLODipine (NORVASC) 5 MG tablet [id number]  Take 1 tablet (5 mg total) by mouth in the morning. Terrilee Croak, MD  Expired 12/07/20 2359   B Complex-C (B-COMPLEX WITH VITAMIN C) tablet [id number] Yes Take 1 tablet by mouth daily. [provider] Taking Active Nursing Home Medication Administration Guide (MAG)  Biotin 10000 MCG TABS [id number] Yes Take 10,000 mcg by mouth daily. [provider] Taking Active Nursing Home Medication Administration Guide (MAG)  Cholecalciferol 5000 units TABS [id number] Yes Take 5,000 Units by mouth daily. [provider] Taking Active Nursing Home Medication Administration Guide (MAG)           Med Note Jimmey Ralph, [facility] I   Fri Aug 07, 2017  6:51 PM)    colchicine 0.6 MG tablet [id number]  Take 1 tablet (0.6 mg total) by mouth daily for 7 days. Terrilee Croak, MD  Expired 11/14/20 2359   Cranberry-Vitamin C 250-60 MG CAPS [id number] Yes Take 1 capsule by mouth daily. [provider] Taking Active Nursing Home Medication Administration Guide (MAG)           Med Note Corky Mull   Fri Oct 12, 2020  3:09 PM)    diclofenac Sodium (VOLTAREN) 1 % GEL [id number] Yes APPLY 2 GRAMS TO AFFECTED AREA(S) 4 TIMES DAILY AS NEEDED  Patient taking differently: Apply 2-4 g topically every 6 (six) hours as needed (to affected  areas).   Glendale Chard, MD Taking Active   gabapentin (NEURONTIN) 300 MG capsule [id number] Yes Take 600 mg by mouth at bedtime. [provider] Taking Active Nursing Home Medication Administration Guide (MAG)  glucose blood (ONETOUCH VERIO) test strip [id number] Yes Use as instructed to check blood sugars 2 times per day dx: e11.65 Glendale Chard, MD Taking Active Nursing Home Medication Administration Guide (MAG)  hydrochlorothiazide (HYDRODIURIL) 25 MG tablet [id number] Yes Take 1 tablet (25 mg total) by mouth daily. Glendale Chard, MD Taking Active   insulin degludec (TRESIBA) 100 UNIT/ML FlexTouch Pen [id number] Yes Inject 5 Units into the skin at bedtime. Terrilee Croak, MD Taking Active   metoprolol succinate (TOPROL-XL) 25 MG 24 hr tablet [id number] Yes Take 1 tablet by mouth daily at bedtime Dahal, Marlowe Aschoff, MD Taking Active   Multiple Vitamins-Minerals (MULTIVITAMIN WITH MINERALS) tablet [id number] Yes Take 1 tablet by mouth daily. Women's 50+ [provider] Taking Active Nursing Home Medication Administration Guide (MAG)           Med Note Jimmey Ralph, [place] I   Fri Aug 07, 2017  6:53 PM)    Jonetta Speak Lancets 09B MISC [id number] Yes Use as instructed to check blood sugars 2 times per day dx: e11.65 Glendale Chard, MD Taking Active Nursing Home Medication Administration Guide (MAG)  polyethylene glycol (MIRALAX / GLYCOLAX) 17 g packet [id number] Yes Take 17 g by mouth daily. Arrien, Jimmy Picket, MD Taking Active Nursing Home Medication Administration Guide (MAG)  Semaglutide, 1 MG/DOSE, (OZEMPIC, 1 MG/DOSE,) 4 MG/3ML SOPN [id number] Yes Inject into the skin. [provider] Taking Active     Discontinued 11/06/20 1600 (Reorder) simvastatin (ZOCOR) 10 MG tablet [id number] Yes Take 1 tablet (10 mg total) by mouth daily.  Patient taking differently: Take 10 mg by mouth at bedtime.   Glendale Chard, MD Taking Active   SYNTHROID 112 MCG tablet [id number] Yes  Take 112  mcg by mouth daily before breakfast. [provider] Taking Active Nursing Home Medication Administration Guide ([place])  Med List Note Gerlene Burdock 11/02/20 2136): Admitted to [facility] on 10/22/2020 Kirstie Mirza) [place], [place]            Patient Active Problem List   Diagnosis Date Noted   Infected wound 11/02/2020   Pressure injury of skin 10/22/2020   Wound infection after surgery 10/12/2020   Normocytic anemia 10/12/2020   AKI (acute kidney injury) ([place]) 10/12/2020   Obesity (BMI 30-39.9) 10/12/2020   Sepsis ([place]) 10/12/2020   Type 2 diabetes mellitus with stage 3 chronic kidney disease, with long-term current use of insulin ([place]) 11/29/2019   Panniculitis 09/30/2019   Back pain 09/30/2019   Hepatoma ([place]) 01/21/2019   Other cirrhosis of liver ([place]) 07/05/2018   Hypertensive nephropathy 07/05/2018   Chronic renal disease, stage III ([place]) 07/05/2018   Chronic left shoulder pain 07/05/2018   Hypothyroidism 02/06/2018   Hepatitis C virus infection cured after antiviral drug therapy 02/04/2018   Osteoarthritis of right knee 07/30/2017   S/P laparoscopic sleeve gastrectomy July 2018 08/26/2016   Preop cardiovascular exam 07/30/2016   Dyspnea on exertion 07/30/2016   Bilateral lower extremity edema 07/30/2016    Immunization History  Administered Date(s) Administered   Fluad Quad(high Dose 65+) 11/10/2018, 11/29/2019, 11/22/2020   Influenza, High Dose Seasonal PF 12/21/2017, 11/10/2018   Influenza,inj,quad, With Preservative 02/25/2016   Influenza-Unspecified 10/25/2016   Moderna Sars-Covid-2 Vaccination 03/28/2019, 04/25/2019, 01/02/2020, 07/06/2020   PNEUMOCOCCAL CONJUGATE-20 08/03/2020   Pneumococcal Polysaccharide-23 11/02/2013   Pneumococcal-Unspecified 11/24/2016   Zoster Recombinat (Shingrix) 11/03/2018, 12/31/2018    Conditions to be addressed/monitored:  Hypertension and Hyperlipidemia  Care Plan : [place]  Updates made by  Mayford Knife, [place] since 12/20/2020 12:00 AM     Problem: HTN, HLD   Priority: High     Long-Range Goal: Disease Management   Start Date: 04/11/2020  Recent Progress: On track  Priority: High  Note:   Current Barriers:  Unable to independently monitor therapeutic efficacy  Pharmacist Clinical Goal(s):  Patient will achieve adherence to monitoring guidelines and medication adherence to achieve therapeutic efficacy through collaboration with PharmD and provider.   Interventions: 1:1 collaboration with Glendale Chard, MD regarding development and update of comprehensive plan of care as evidenced by provider attestation and co-signature Inter-disciplinary care team collaboration (see longitudinal plan of care) Comprehensive medication review performed; medication list updated in electronic medical record  Hypertension (BP goal <130/80) -Controlled -Current treatment: Amlodipine 5 mg tablet daily Hydrochlorothiazide 25 mg tablet once per day Metoprolol Succinate 25 mg tablet once per day  -Current home readings: 141/69, checking once per day -Current dietary habits: not eating any fried or fatty foods  -Denies hypotensive/hypertensive symptoms -Educated on Importance of home blood pressure monitoring; Proper BP monitoring technique; -Counseled to monitor BP at home at least once per week, document, and provide log at future appointments -Counseled on diet and exercise extensively Recommended to continue current medication  Hyperlipidemia: (LDL goal <70) -Controlled -Current treatment: Simvastatin 10 mg tablet once per day  -Current dietary patterns: she is eating healthy -Current exercise habits: she is currently recovering from her surgery  -Educated on Cholesterol goals;  -Counseled on diet and exercise extensively Recommended to continue current medication  Patient Goals/Self-Care Activities Patient will:  - take medications as prescribed  Follow Up Plan: The  patient has been provided with contact information for the  care management team and has been advised to call with any health related questions or concerns.       Medication Assistance: None required.  Patient affirms current coverage meets needs.  Compliance/Adherence/Medication fill history: Care Gaps: COVID-19 Booster  Star-Rating Drugs: Ozempic 1 mg  Simvastatin 10 mg tablet   Patient's preferred pharmacy is:  [facility], [place] [address] [place] [address] Phone: [phone] Fax: [place] [place] [address] Phone: [phone] Fax: [phone]  [facility], [address] [place] [address] Phone: [phone] Fax: [phone]  Uses pill box? Yes Pt endorses 95% compliance  We discussed: Benefits of medication synchronization, packaging and delivery as well as enhanced pharmacist oversight with [employer]. Patient decided to: Continue current medication management strategy  Care Plan and Follow Up Patient Decision:  Patient agrees to Care Plan and Follow-up.  Plan: The patient has been provided with contact information for the care management team and has been advised to call with any health related questions or concerns.   [name], PharmD Clinical Pharmacist [facility] [phone]

## 2020-12-14 ENCOUNTER — Ambulatory Visit (INDEPENDENT_AMBULATORY_CARE_PROVIDER_SITE_OTHER): Payer: Medicare HMO | Admitting: Physician Assistant

## 2020-12-14 ENCOUNTER — Other Ambulatory Visit: Payer: Self-pay

## 2020-12-14 DIAGNOSIS — T8149XA Infection following a procedure, other surgical site, initial encounter: Secondary | ICD-10-CM

## 2020-12-17 ENCOUNTER — Telehealth: Payer: Self-pay | Admitting: Physician Assistant

## 2020-12-17 NOTE — Telephone Encounter (Signed)
NO FOLLOW UP NEEDED   Anissa from [facility] called to inform us that Teresa Golden has requested to do her [place] treatments Sunday, Monday, Wednesday next week instead of the normal Monday, Wednesday, Friday.  So she will not be doing her normal routine due to her wanting to attend church Sunday.   She will return to normal schedule after next week.   No follow up call needed unless feels necessary.

## 2020-12-19 ENCOUNTER — Telehealth: Payer: Self-pay

## 2020-12-19 NOTE — Telephone Encounter (Signed)
Returned patients call. Advised she is able to take a shower, let the soap run down from her shoulder, do not let water from shower head hit directly on the incision/abdominal area. Make sure the incision is good and dry before reapplying her dressings. Patient understood and agreed with plan.

## 2020-12-19 NOTE — Telephone Encounter (Signed)
Patient called to find out if she can take a shower if she wraps her wound in saran wrap.  Please call.

## 2020-12-20 ENCOUNTER — Ambulatory Visit (INDEPENDENT_AMBULATORY_CARE_PROVIDER_SITE_OTHER): Payer: Medicare HMO | Admitting: Addiction (Substance Use Disorder)

## 2020-12-20 DIAGNOSIS — F4323 Adjustment disorder with mixed anxiety and depressed mood: Secondary | ICD-10-CM | POA: Diagnosis not present

## 2020-12-20 NOTE — Progress Notes (Signed)
[facility] Counselor/Therapist Progress Note  [name], MRN: [id number],    Date: 12/20/2020   Time Spent:  18mins  Treatment Type: Individual Therapy  Reported Symptoms: more energized but trying to stabilize further  Mental Status Exam:  Appearance:   NA     Behavior:  Appropriate and Sharing  Motor:  Normal  Speech/Language:   Clear and Coherent and Normal Rate  Affect:  Appropriate  Mood:  normal  Thought process:  normal  Thought content:    Rumination  Sensory/Perceptual disturbances:    WNL  Orientation:  x4  Attention:  Good  Concentration:  Good  Memory:  WNL  Fund of knowledge:   Good  Insight:    Good  Judgment:   Good  Impulse Control:  Good   Risk Assessment: Danger to Self:  No Self-injurious Behavior: No Danger to Others: No Duty to Warn:no Physical Aggression / Violence:No  Access to Firearms a concern: No  Gang Involvement:No   Virtual Visit via TELEPHONE : I connected with client by telephone, with their informed consent, and verified client privacy and that I am speaking with the correct person using two identifiers. I discussed the limitations, risks, security and privacy concerns of performing psychotherapy and management service virtually and confirmed their location. I also discussed with the patient that there may be a patient responsible charge related to this service and to confirm with the front desk if their insurance covers teletherapy. I also discussed with the patient the availability of in person appointments. The patient expressed understanding and agreed to proceed. I discussed the treatment planning with the client. The client was provided an opportunity to ask questions and all were answered. The client agreed with the plan and demonstrated an understanding of the instructions. The client was advised to call our office if symptoms worsen or feel they are in a crisis state and need immediate contact. Client also  reminded of a crisis line number and to use 9-1-1 if there's an emergency.  Therapist Location: office; Client Location: home  Subjective: Client reported feeling more energized but trying to stabilize further as her immune system built back up, following the big infection she had post surgery. Client expressed her inability to cope well in the "nursing home" when rehabing but doing much better, per her doctor, since returning home where she feels more motivated to recover well & quickly. Client processed the grief and fear she felt when the infection happened and therapist used MI & grief therapy to validate client's feelings and normalize her fear, while also helping to provide encouragement to help client grieve the lack of control she had over the surgery and recovery. Therapist assessed for stability and client denied SI/HI/AVH.   Interventions: Motivational Interviewing, Grief Therapy, and RPT    Diagnosis:   ICD-10-CM   1. Adjustment disorder with mixed anxiety and depressed mood  F43.23       Plan of Care:  Client is to return to therapy with therapist every 1-2 weeks as needed to process traumas/ frustrations in a safe space, to be re-evaluated in 3 months.  Client is to practice mindfulness AEB daily meditation and body scans or as needed when flooded by emotion/pain.  Client is to learn/practice DBT wise mind & radical acceptance. Client is to practice self-compassion AEB being gentle with themselves, utilizing self-care techniques daily or as needed when grieving something in the moment.  Client is to process grief/pain of their  loss in a somatic body-felt sense way: ie using mindfulness, brainspotting, or trauma release as a method for releasing body pain/tension caused by grief.  Barnie Del, LCSW, LCAS, CCTP, CCS-I, BSP

## 2020-12-20 NOTE — Patient Instructions (Addendum)
Visit Information It was great speaking with you today!  Please let me know if you have any questions about our visit.   Goals Addressed             This Visit's Progress    Manage My Medicine       Timeframe:  Long-Range Goal Priority:  High Start Date:    04/11/2020                         Expected End Date:                       Follow Up Date 01/31/2021   In Progress: - call for medicine refill 2 or 3 days before it runs out - call if I am sick and can't take my medicine - keep a list of all the medicines I take; vitamins and herbals too - use an alarm clock or phone to remind me to take my medicine    Why is this important?   These steps will help you keep on track with your medicines.           Patient Care Plan: CCM Pharmacy Care Plan     Problem Identified: HTN, HLD   Priority: High     Long-Range Goal: Disease Management   Start Date: 04/11/2020  Recent Progress: On track  Priority: High  Note:   Current Barriers:  Unable to independently monitor therapeutic efficacy  Pharmacist Clinical Goal(s):  Patient will achieve adherence to monitoring guidelines and medication adherence to achieve therapeutic efficacy through collaboration with PharmD and provider.   Interventions: 1:1 collaboration with Glendale Chard, MD regarding development and update of comprehensive plan of care as evidenced by provider attestation and co-signature Inter-disciplinary care team collaboration (see longitudinal plan of care) Comprehensive medication review performed; medication list updated in electronic medical record  Hypertension (BP goal <130/80) -Controlled -Current treatment: Amlodipine 5 mg tablet daily Hydrochlorothiazide 25 mg tablet once per day Metoprolol Succinate 25 mg tablet once per day  -Current home readings: 141/69, checking once per day -Current dietary habits: not eating any fried or fatty foods  -Denies hypotensive/hypertensive symptoms -Educated on  Importance of home blood pressure monitoring; Proper BP monitoring technique; -Counseled to monitor BP at home at least once per week, document, and provide log at future appointments -Counseled on diet and exercise extensively Recommended to continue current medication  Hyperlipidemia: (LDL goal <70) -Controlled -Current treatment: Simvastatin 10 mg tablet once per day  -Current dietary patterns: she is eating healthy -Current exercise habits: she is currently recovering from her surgery  -Educated on Cholesterol goals;  -Counseled on diet and exercise extensively Recommended to continue current medication  Patient Goals/Self-Care Activities Patient will:  - take medications as prescribed  Follow Up Plan: The patient has been provided with contact information for the care management team and has been advised to call with any health related questions or concerns.        Patient agreed to services and verbal consent obtained.   The patient verbalized understanding of instructions, educational materials, and care plan provided today and agreed to receive a mailed copy of patient instructions, educational materials, and care plan.   [name], PharmD Clinical Pharmacist [facility] [phone]

## 2020-12-24 DIAGNOSIS — E1122 Type 2 diabetes mellitus with diabetic chronic kidney disease: Secondary | ICD-10-CM

## 2020-12-24 DIAGNOSIS — N182 Chronic kidney disease, stage 2 (mild): Secondary | ICD-10-CM | POA: Diagnosis not present

## 2020-12-24 DIAGNOSIS — E7800 Pure hypercholesterolemia, unspecified: Secondary | ICD-10-CM

## 2020-12-24 DIAGNOSIS — I129 Hypertensive chronic kidney disease with stage 1 through stage 4 chronic kidney disease, or unspecified chronic kidney disease: Secondary | ICD-10-CM

## 2020-12-24 DIAGNOSIS — Z794 Long term (current) use of insulin: Secondary | ICD-10-CM

## 2020-12-24 DIAGNOSIS — N183 Chronic kidney disease, stage 3 unspecified: Secondary | ICD-10-CM | POA: Diagnosis not present

## 2020-12-25 ENCOUNTER — Ambulatory Visit (INDEPENDENT_AMBULATORY_CARE_PROVIDER_SITE_OTHER): Payer: Medicare HMO

## 2020-12-25 ENCOUNTER — Ambulatory Visit (INDEPENDENT_AMBULATORY_CARE_PROVIDER_SITE_OTHER): Payer: Medicare HMO | Admitting: Internal Medicine

## 2020-12-25 ENCOUNTER — Other Ambulatory Visit: Payer: Self-pay

## 2020-12-25 ENCOUNTER — Encounter: Payer: Self-pay | Admitting: Internal Medicine

## 2020-12-25 VITALS — BP 120/68 | HR 87 | Temp 99.0°F | Ht 65.0 in | Wt 181.2 lb

## 2020-12-25 DIAGNOSIS — I82452 Acute embolism and thrombosis of left peroneal vein: Secondary | ICD-10-CM | POA: Diagnosis not present

## 2020-12-25 DIAGNOSIS — E1122 Type 2 diabetes mellitus with diabetic chronic kidney disease: Secondary | ICD-10-CM | POA: Diagnosis not present

## 2020-12-25 DIAGNOSIS — Z794 Long term (current) use of insulin: Secondary | ICD-10-CM

## 2020-12-25 DIAGNOSIS — N182 Chronic kidney disease, stage 2 (mild): Secondary | ICD-10-CM

## 2020-12-25 DIAGNOSIS — E6609 Other obesity due to excess calories: Secondary | ICD-10-CM

## 2020-12-25 DIAGNOSIS — I129 Hypertensive chronic kidney disease with stage 1 through stage 4 chronic kidney disease, or unspecified chronic kidney disease: Secondary | ICD-10-CM

## 2020-12-25 DIAGNOSIS — Z6830 Body mass index (BMI) 30.0-30.9, adult: Secondary | ICD-10-CM

## 2020-12-25 DIAGNOSIS — N183 Chronic kidney disease, stage 3 unspecified: Secondary | ICD-10-CM

## 2020-12-25 MED ORDER — RIVAROXABAN 20 MG PO TABS: 20.0000 mg | ORAL_TABLET | Freq: Every day | ORAL | 1 refills | Status: DC

## 2020-12-25 NOTE — Patient Instructions (Signed)
Social Worker Visit Information  Goals we discussed today:   Goals Addressed             This Visit's Progress    Work with SW to manage care coordination needs   On track    Timeframe:  Long-Range Goal Priority:  [employer] Start Date:   12.16.21                                           Next date of contact: 11.9.22  Patient Goals/Self-Care Activities patient will:  -Engage with pharmacy team to address Ozempic questions -Contact SW as needed prior to next scheduled appointment         Patient verbalizes understanding of instructions provided today and agrees to view in [place].   Follow Up Plan: SW will follow up with patient by phone over the next 10 days   Daneen Schick, BSW, CDP Social Worker, Certified Dementia Practitioner [facility] [phone]

## 2020-12-25 NOTE — Chronic Care Management (AMB) (Signed)
Chronic Care Management    Social Work Note  12/25/2020 Name: Teresa Golden MRN: [id number] DOB: April 23, 1945  Teresa Golden is a 75 y.o. year old female who is a primary care patient of Teresa Chard, MD. The CCM team was consulted to assist the patient with chronic disease management and/or care coordination needs related to:  DM II, CKD III, HTN .   Engaged with patient by telephone for follow up visit in response to provider referral for social work chronic care management and care coordination services.   Consent to Services:  The patient was given information about Chronic Care Management services, agreed to services, and gave verbal consent prior to initiation of services.  Please see initial visit note for detailed documentation.   Patient agreed to services and consent obtained.   Assessment: Review of patient past medical history, allergies, medications, and health status, including review of relevant consultants reports was performed today as part of a comprehensive evaluation and provision of chronic care management and care coordination services.     SDOH (Social Determinants of Health) assessments and interventions performed:    Advanced Directives Status: Not addressed in this encounter.  CCM Care Plan  Allergies  Allergen Reactions   Meloxicam Other (See Comments)    Fever; muscle aches; "flu-like" symptoms "Allergic," per Teresa Golden   Other Other (See Comments)    "Rose fever and hay fever"    Outpatient Encounter Medications as of 12/25/2020  Medication Sig   acetaminophen (TYLENOL) 325 MG tablet Take 2 tablets (650 mg total) by mouth every 6 (six) hours as needed for moderate pain or mild pain.   amLODipine (NORVASC) 5 MG tablet Take 1 tablet (5 mg total) by mouth in the morning.   B Complex-C (B-COMPLEX WITH VITAMIN C) tablet Take 1 tablet by mouth daily.   Biotin 10000 MCG TABS Take 10,000 mcg by mouth daily.   Cholecalciferol 5000 units TABS Take 5,000 Units by  mouth daily.   colchicine 0.6 MG tablet Take 1 tablet (0.6 mg total) by mouth daily for 7 days.   Cranberry-Vitamin C 250-60 MG CAPS Take 1 capsule by mouth daily.   diclofenac Sodium (VOLTAREN) 1 % GEL APPLY 2 GRAMS TO AFFECTED AREA(S) 4 TIMES DAILY AS NEEDED (Patient taking differently: Apply 2-4 g topically every 6 (six) hours as needed (to affected areas).)   gabapentin (NEURONTIN) 300 MG capsule Take 600 mg by mouth at bedtime.   glucose blood (Teresa Golden) test strip Use as instructed to check blood sugars 2 times per day dx: e11.65   hydrochlorothiazide (HYDRODIURIL) 25 MG tablet Take 1 tablet (25 mg total) by mouth daily.   insulin degludec (TRESIBA) 100 UNIT/ML FlexTouch Pen Inject 5 Units into the skin at bedtime.   metoprolol succinate (TOPROL-XL) 25 MG 24 hr tablet Take 1 tablet by mouth daily at bedtime   Multiple Vitamins-Minerals (MULTIVITAMIN WITH MINERALS) tablet Take 1 tablet by mouth daily. Women's 82+   Teresa Golden 95A MISC Use as instructed to check blood sugars 2 times per day dx: e11.65   polyethylene glycol (Teresa Golden) 17 g packet Take 17 g by mouth daily.   Semaglutide, 1 MG/DOSE, (OZEMPIC, 1 MG/DOSE,) 4 MG/3ML SOPN Inject into the skin.   simvastatin (ZOCOR) 10 MG tablet Take 1 tablet (10 mg total) by mouth daily. (Patient taking differently: Take 10 mg by mouth at bedtime.)   SYNTHROID 112 MCG tablet Take 112 mcg by mouth daily before breakfast.   [DISCONTINUED]  SEMGLEE, YFGN, 100 UNIT/ML Pen Inject 9 Units into the skin at bedtime.   No facility-administered encounter medications on file as of 12/25/2020.    Patient Active Problem List   Diagnosis Date Noted   Infected wound 11/02/2020   Pressure injury of skin 10/22/2020   Wound infection after surgery 10/12/2020   Normocytic anemia 10/12/2020   AKI (acute kidney injury) (Teresa Golden) 10/12/2020   Obesity (BMI 30-39.9) 10/12/2020   Sepsis (Teresa Golden) 10/12/2020   Type 2 diabetes mellitus with stage 3  chronic kidney disease, with long-term current use of insulin (Teresa Golden) 11/29/2019   Panniculitis 09/30/2019   Back pain 09/30/2019   Hepatoma (Teresa Golden) 01/21/2019   Other cirrhosis of liver (Teresa Golden) 07/05/2018   Teresa Golden 07/05/2018   Chronic renal disease, stage III (Teresa Golden) 07/05/2018   Chronic left shoulder pain 07/05/2018   Hypothyroidism 02/06/2018   Teresa Golden 02/04/2018   Osteoarthritis of right knee 07/30/2017   S/P laparoscopic sleeve gastrectomy July 2018 08/26/2016   Preop cardiovascular exam 07/30/2016   Dyspnea on exertion 07/30/2016   Bilateral lower extremity edema 07/30/2016    Conditions to be addressed/monitored: HTN, DMII, and CKD Stage III  Care Plan : Social Work Care Plan  Updates made by Teresa Golden since 12/25/2020 12:00 AM     Problem: Care Coordination      Long-Range Goal: Collaborate with RN Care Manager to perform appropriate assessments to assist with care coordination needs   Start Date: 03/02/2020  This Visit's Progress: On track  Recent Progress: On track  Priority: Low  Note:   Current Barriers:  Chronic conditions including DM II, CKD III, and HTN which put patient at increased risk of hospitalization Recent hospitalization due to wound infection port panniculectomy  Limited access to a caregiver  Social Work Clinical Goal(s):  patient will work with SW to address concerns related to DM II, CKD III, and HTN and manage care coordination needs Patient will follow the recommendations of her provider as evidenced by remaining in rehab until appropriate for discharge completed New: Patient will follow up with her primary care provider regarding concerns with hearing  Interventions: 1:1 collaboration with Teresa Chard, MD regarding development and update of comprehensive plan of care as evidenced by provider attestation and co-signature Inter-disciplinary care team collaboration (see  longitudinal plan of care) Inbound call received from patient who reports concern that she will run out of Ozempic before the end of the year - patient states she just realized she only has one pen left and is unsure if she will receive another under patient assistance or will need to pay out of pocket Advised the patient SW would collaborate with the pharmacy team to request they contact the patient to follow up on patient assistance questions Patient reports concern whether or not she will need to stay on blood thinner and wonders what the out of pocket costs may be Patient reports she has an appointment to see her primary care provider and will plan to ask about the medication - advised the patient a member of the pharmacy team may assist with patient assistance of blood thinner medication if needed Discussed the patient has enrolled in an Alzheimer's study and goes next week to have blood levels drawn to see if she qualifies to participate  SW scheduled a follow up call over the next week Collaboration with [name] PharmD to request a member of the pharmacy team contact the patient re: Ozempic  Patient Goals/Self-Care Activities patient will:  -Engage with pharmacy team to address Ozempic questions -Contact SW as needed prior to next scheduled appointment  Follow up Plan: SW will follow up with patient by phone over the next 10 days       Follow Up Plan: SW will follow up with patient by phone over the next 10 days      Teresa Golden, BSW, CDP Social Worker, Certified Dementia Practitioner [facility] [phone]

## 2020-12-25 NOTE — Patient Instructions (Signed)

## 2020-12-25 NOTE — Progress Notes (Signed)
I,Katawbba Wiggins,acting as a Education administrator for Maximino Greenland, MD.,have documented all relevant documentation on the behalf of Maximino Greenland, MD,as directed by  Maximino Greenland, MD while in the presence of Maximino Greenland, MD.  This visit occurred during the SARS-CoV-2 public health emergency.  Safety protocols were in place, including screening questions prior to the visit, additional usage of staff PPE, and extensive cleaning of exam room while observing appropriate contact time as indicated for disinfecting solutions.  Subjective:     [name] , female    DOB: [birth year] , 75 y.o.   MRN: [id number]   Chief Complaint  Patient presents with   Diabetes   Hypertension    HPI  The patient is here today for a diabetes/HTN f/u.  She reports compliance with meds. She denies headaches, chest pain and shortness of breath.   Diabetes She presents for her follow-up diabetic visit. She has type 2 diabetes mellitus. Pertinent negatives for diabetes include no blurred vision and no chest pain. There are no hypoglycemic complications. Risk factors for coronary artery disease include diabetes mellitus, dyslipidemia, hypertension, obesity, sedentary lifestyle and post-menopausal. She is following a diabetic diet. She participates in exercise intermittently. Her home blood glucose trend is fluctuating minimally. Her breakfast blood glucose is taken between 8-9 am. Her breakfast blood glucose range is generally 110-130 mg/dl. An ACE inhibitor/angiotensin II receptor blocker is being taken. Eye exam is current.  Hypertension This is a chronic problem. The current episode started more than 1 year ago. The problem has been gradually improving since onset. The problem is uncontrolled. Pertinent negatives include no blurred vision or chest pain. Risk factors for coronary artery disease include diabetes mellitus, dyslipidemia, post-menopausal state and sedentary lifestyle. Past treatments include  angiotensin blockers and beta blockers. The current treatment provides moderate improvement.    Past Medical History:  Diagnosis Date   Arthritis    Chronic kidney disease    self reports ckd stage 3    Depression    Diabetes mellitus, type II, insulin dependent ([place])    With neurologic complications. 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    Hypertensive nephropathy 07/05/2018   Hypothyroidism    Hypothyroidism 02/06/2018   Morbid obesity with BMI of 50.0-59.9, adult ([place])    Scoliosis      Family History  Problem Relation Age of Onset   Heart attack Mother    Heart failure Mother    Stroke Father      Current Outpatient Medications:    acetaminophen (TYLENOL) 325 MG tablet, Take 2 tablets (650 mg total) by mouth every 6 (six) hours as needed for moderate pain or mild pain., Disp: , Rfl:    B Complex-C (B-COMPLEX WITH VITAMIN C) tablet, Take 1 tablet by mouth daily., Disp: , Rfl:    Biotin 10000 MCG TABS, Take 10,000 mcg by mouth daily., Disp: , Rfl:    Cholecalciferol 5000 units TABS, Take 5,000 Units by mouth daily., Disp: , Rfl:    colchicine 0.6 MG tablet, Take 1 tablet (0.6 mg total) by mouth daily for 7 days., Disp: 7 tablet, Rfl: 0   Cranberry-Vitamin C 250-60 MG CAPS, Take 1 capsule by mouth daily.,  Disp: , Rfl:    diclofenac Sodium (VOLTAREN) 1 % GEL, APPLY 2 GRAMS TO AFFECTED AREA(S) 4 TIMES DAILY AS NEEDED (Patient taking differently: Apply 2-4 g topically every 6 (six) hours as needed (to affected areas).), Disp: 200 g, Rfl: 2   gabapentin (NEURONTIN) 300 MG capsule, Take 600 mg by mouth at bedtime., Disp: , Rfl:    Multiple Vitamins-Minerals (MULTIVITAMIN WITH MINERALS) tablet, Take 1 tablet by mouth daily. Women's 50+, Disp:  , Rfl:    polyethylene glycol (MIRALAX / GLYCOLAX) 17 g packet, Take 17 g by mouth daily., Disp: 14 each, Rfl: 0   simvastatin (ZOCOR) 10 MG tablet, Take 1 tablet (10 mg total) by mouth daily. (Patient taking differently: Take 10 mg by mouth at bedtime.), Disp: 90 tablet, Rfl: 1   SYNTHROID 112 MCG tablet, Take 112 mcg by mouth daily before breakfast., Disp: , Rfl:    amLODipine (NORVASC) 5 MG tablet, Take 1 tablet by mouth once daily, Disp: 90 tablet, Rfl: 0   glucose blood (ONETOUCH VERIO) test strip, USE TO CHECK BLOOD SUGAR   TWO TIMES A DAY AS         INSTRUCTED, Disp: 200 strip, Rfl: 3   hydrochlorothiazide (HYDRODIURIL) 25 MG tablet, Take 1 tablet (25 mg total) by mouth daily., Disp: 30 tablet, Rfl: 0   Lancets (ONETOUCH DELICA PLUS NTIRWE31V) MISC, USE TO CHECK BLOOD SUGARS  TWO TIMES A DAY AS         INSTRUCTED, Disp: 200 each, Rfl: 3   metoprolol succinate (TOPROL-XL) 25 MG 24 hr tablet, TAKE 1 TABLET AT BEDTIME   (DISCONTINUE COREG), Disp: 90 tablet, Rfl: 1   rivaroxaban (XARELTO) 20 MG TABS tablet, Take 1 tablet (20 mg total) by mouth daily with supper., Disp: 30 tablet, Rfl: 1   Semaglutide, 1 MG/DOSE, (OZEMPIC, 1 MG/DOSE,) 4 MG/3ML SOPN, Inject 1 mg into the skin once a week., Disp: 9 mL, Rfl: 1   Semaglutide, 1 MG/DOSE, (OZEMPIC, 1 MG/DOSE,) 4 MG/3ML SOPN, Inject 1 mg into the skin once a week., Disp: 9 mL, Rfl: 1   Allergies  Allergen Reactions   Meloxicam Other (See Comments)    Fever; muscle aches; "flu-like" symptoms "Allergic," per MAR   Other Other (See Comments)    "Rose fever and hay fever"     Review of Systems  Constitutional: Negative.   Eyes:  Negative for blurred vision.  Respiratory: Negative.    Cardiovascular: Negative.  Negative for chest pain.  Gastrointestinal: Negative.   Psychiatric/Behavioral: Negative.    All other systems reviewed and are negative.   Today's Vitals   12/25/20 1144  BP: 120/68  Pulse: 87  Temp: 99 F (37.2 C)  Weight: 181 lb  3.2 oz (82.2 kg)  Height: '5\' 5"'  (1.651 m)  PainSc: 4   PainLoc: Abdomen   Body mass index is 30.15 kg/m.  Wt Readings from Last 3 Encounters:  01/16/21 181 lb (82.1 kg)  12/25/20 181 lb 3.2 oz (82.2 kg)  11/22/20 200 lb 3.2 oz (90.8 kg)    BP Readings from Last 3 Encounters:  01/16/21 (!) 154/80  12/25/20 120/68  11/22/20 134/68    Objective:  Physical Exam Vitals and nursing note reviewed.  Constitutional:      Appearance: Normal appearance. She is obese.  HENT:     Head: Normocephalic and atraumatic.     Nose:     Comments: Masked     Mouth/Throat:     Comments: Masked  Eyes:  Extraocular Movements: Extraocular movements intact.  Cardiovascular:     Rate and Rhythm: Normal rate and regular rhythm.     Heart sounds: Normal heart sounds.  Pulmonary:     Effort: Pulmonary effort is normal.     Breath sounds: Normal breath sounds.  Musculoskeletal:     Cervical back: Normal range of motion.  Skin:    General: Skin is warm.  Neurological:     General: No focal deficit present.     Mental Status: She is alert.  Psychiatric:        Mood and Affect: Mood normal.        Behavior: Behavior normal.        Assessment And Plan:     1. Type 2 diabetes mellitus with stage 2 chronic kidney disease, with long-term current use of insulin (HCC) Comments: Chronic, I will check labs as listed below. I will adjust meds as needed. Encouraged to limit intake of sweetened beverages.  - Hemoglobin A1c - BMP8+eGFR  2. Hypertensive nephropathy Comments: Chronic, well controlled. Encouraged to follow low sodium diet. I will check renal function.   3. Acute deep vein thrombosis (DVT) of left peroneal vein (HCC) Comments: This is post-operative complication, s/p panniculectomy complicated by infection. She has completed starter pack of Xarelto, sending 67m rx today.   4. Class 1 obesity due to excess calories with serious comorbidity and body mass index (BMI) of 30.0 to 30.9 in  adult  She is encouraged to strive to lose at 7-10 pounds to decrease cardiac risk. Advised to aim for at least 150 minutes of exercise per week.   Patient was given opportunity to ask questions. Patient verbalized understanding of the plan and was able to repeat key elements of the plan. All questions were answered to their satisfaction.   I, RMaximino Greenland MD, have reviewed all documentation for this visit. The documentation on 12/25/20 for the exam, diagnosis, procedures, and orders are all accurate and complete.   IF YOU HAVE BEEN REFERRED TO A SPECIALIST, IT MAY TAKE 1-2 WEEKS TO SCHEDULE/PROCESS THE REFERRAL. IF YOU HAVE NOT HEARD FROM US/SPECIALIST IN TWO WEEKS, PLEASE GIVE [place] CALL AT [phone].   THE PATIENT IS ENCOURAGED TO PRACTICE SOCIAL DISTANCING DUE TO THE COVID-19 PANDEMIC.

## 2020-12-26 ENCOUNTER — Telehealth: Payer: Medicare HMO

## 2020-12-26 ENCOUNTER — Telehealth: Payer: Self-pay

## 2020-12-26 LAB — BMP8+EGFR
BUN/Creatinine Ratio: 11 — ABNORMAL LOW (ref 12–28)
BUN: 14 mg/dL (ref 8–27)
CO2: 23 mmol/L (ref 20–29)
Calcium: 9.5 mg/dL (ref 8.7–10.3)
Chloride: 107 mmol/L — ABNORMAL HIGH (ref 96–106)
Creatinine, Ser: 1.23 mg/dL — ABNORMAL HIGH (ref 0.57–1.00)
Glucose: 165 mg/dL — ABNORMAL HIGH (ref 70–99)
Potassium: 3.9 mmol/L (ref 3.5–5.2)
Sodium: 144 mmol/L (ref 134–144)
eGFR: 46 mL/min/{1.73_m2} — ABNORMAL LOW (ref >59)

## 2020-12-26 LAB — HEMOGLOBIN A1C
Est. average glucose Bld gHb Est-mCnc: 134 mg/dL
Hgb A1c MFr Bld: 6.3 % — ABNORMAL HIGH (ref 4.8–5.6)

## 2020-12-26 NOTE — Progress Notes (Signed)
Subjective:     [name], female    DOB: 1945/11/07, 75 y.o.   MRN: [id number]  Chief Complaint  Patient presents with   Follow-up    HPI: The patient is a 75 y.o. female here for follow-up after panniculectomy with Dr. Marla Roe on 09/26/2020. [name], she is receiving assistance with wound care from home health RN.  They are currently doing alginate dressing changes per patient.  She feels as if things are going well.  She is very happy with the nursing assistance she is receiving.  She is currently on Xarelto for DVT.  She is not having any issues in relation to this.  She is not having any infectious symptoms.  She would like to begin taking out the trash and has some questions about restrictions.  Review of Systems  Constitutional: Negative.   Respiratory: Negative.    Gastrointestinal: Negative.   Skin:  Positive for wound.    Objective:   Vital Signs There were no vitals taken for this visit. Vital Signs and Nursing Note Reviewed Chaperone present Physical Exam Constitutional:      General: She is not in acute distress.    Appearance: Normal appearance. She is not ill-appearing.  HENT:     Head: Normocephalic and atraumatic.  Abdominal:     Comments: Multiple wounds noted throughout the horizontal abdominal incision, the majority of the wounds are hyper granulated.  There is no erythema or cellulitic changes noted.  There is some exudate noted within the folds of her abdomen but no exudate noted within the wound bed.  I do not appreciate any significant foul odors.  I do not appreciate any subcutaneous fluid collections.   Skin:    General: Skin is warm and dry.  Neurological:     General: No focal deficit present.     Mental Status: She is alert and oriented to person, place, and time. Mental status is at baseline.  Psychiatric:        Mood and Affect: Mood normal.        Behavior: Behavior normal.      Assessment/Plan:      ICD-10-CM   1. Wound infection after surgery  T81.49XA     2. S/P panniculectomy  Z98.41       75 year old female status post panniculectomy on 09/26/2020 with Dr. Marla Roe.  She has been receiving assistance at home with dressing changes, currently reports alginate dressing changes with assistance from the home health RN.  She does have a lot of hypergranulation tissue noted throughout the multiple abdominal wounds.  These were cauterized using silver nitrate today in the office, patient tolerated this fine with no complications.  In regards to recommendations for ongoing home health RN wound care assistance, recommend continue with dressing changes Monday Wednesday Friday with assistance from home health RN.  Can continue using alginate or silver alginate.  I do think that if there is some continued hypergranulation tissue and this is within the scope of the [facility] RN that hypergranulation tissue can be cauterized using silver nitrate sticks weekly.  Recommend following up in 2-3 weeks for reevaluation.  Recommend calling with questions or concerns.  I do not appreciate any signs of infection on exam.  Overall everything appears to be healing well.  Pictures were obtained of the patient and placed in the chart with the patient's or guardian's permission.   Teresa Rhine [name], PA-C 12/28/2020, 1:15 PM

## 2020-12-26 NOTE — Chronic Care Management (AMB) (Signed)
    Chronic Care Management Pharmacy Assistant   Name: Teresa Golden  MRN: [id number] DOB: 15-Jun-1945   Reason for Encounter: PAP    Medications: Outpatient Encounter Medications as of 12/26/2020  Medication Sig   acetaminophen (TYLENOL) 325 MG tablet Take 2 tablets (650 mg total) by mouth every 6 (six) hours as needed for moderate pain or mild pain.   amLODipine (NORVASC) 5 MG tablet Take 1 tablet (5 mg total) by mouth in the morning.   B Complex-C (B-COMPLEX WITH VITAMIN C) tablet Take 1 tablet by mouth daily.   Biotin 10000 MCG TABS Take 10,000 mcg by mouth daily.   Cholecalciferol 5000 units TABS Take 5,000 Units by mouth daily.   colchicine 0.6 MG tablet Take 1 tablet (0.6 mg total) by mouth daily for 7 days.   Cranberry-Vitamin C 250-60 MG CAPS Take 1 capsule by mouth daily.   diclofenac Sodium (VOLTAREN) 1 % GEL APPLY 2 GRAMS TO AFFECTED AREA(S) 4 TIMES DAILY AS NEEDED (Patient taking differently: Apply 2-4 g topically every 6 (six) hours as needed (to affected areas).)   gabapentin (NEURONTIN) 300 MG capsule Take 600 mg by mouth at bedtime.   glucose blood (ONETOUCH VERIO) test strip Use as instructed to check blood sugars 2 times per day dx: e11.65   hydrochlorothiazide (HYDRODIURIL) 25 MG tablet Take 1 tablet (25 mg total) by mouth daily. (Patient not taking: Reported on 12/25/2020)   insulin degludec (TRESIBA) 100 UNIT/ML FlexTouch Pen Inject 5 Units into the skin at bedtime.   metoprolol succinate (TOPROL-XL) 25 MG 24 hr tablet Take 1 tablet by mouth daily at bedtime   Multiple Vitamins-Minerals (MULTIVITAMIN WITH MINERALS) tablet Take 1 tablet by mouth daily. Women's 56+   OneTouch Delica Lancets 21H MISC Use as instructed to check blood sugars 2 times per day dx: e11.65   polyethylene glycol (MIRALAX / GLYCOLAX) 17 g packet Take 17 g by mouth daily.   rivaroxaban (XARELTO) 20 MG TABS tablet Take 1 tablet (20 mg total) by mouth daily with supper.   Semaglutide, 1 MG/DOSE,  (OZEMPIC, 1 MG/DOSE,) 4 MG/3ML SOPN Inject into the skin.   simvastatin (ZOCOR) 10 MG tablet Take 1 tablet (10 mg total) by mouth daily. (Patient taking differently: Take 10 mg by mouth at bedtime.)   SYNTHROID 112 MCG tablet Take 112 mcg by mouth daily before breakfast.   [DISCONTINUED] SEMGLEE, YFGN, 100 UNIT/ML Pen Inject 9 Units into the skin at bedtime.   No facility-administered encounter medications on file as of 12/26/2020.   12-26-2020: [facility] patient assistance and was informed by the auto mated system that both [place] and Ozempic has been shipped. Left patient at voicemail with update.  [place] Pharmacist Assistant [phone]

## 2020-12-28 ENCOUNTER — Other Ambulatory Visit: Payer: Self-pay

## 2020-12-28 ENCOUNTER — Ambulatory Visit (INDEPENDENT_AMBULATORY_CARE_PROVIDER_SITE_OTHER): Payer: Medicare HMO | Admitting: Surgical

## 2020-12-28 DIAGNOSIS — T8149XA Infection following a procedure, other surgical site, initial encounter: Secondary | ICD-10-CM | POA: Diagnosis not present

## 2020-12-28 DIAGNOSIS — Z9889 Other specified postprocedural states: Secondary | ICD-10-CM | POA: Diagnosis not present

## 2020-12-31 ENCOUNTER — Telehealth: Payer: Self-pay | Admitting: Plastic Surgery

## 2020-12-31 ENCOUNTER — Telehealth: Payer: Medicare HMO

## 2020-12-31 ENCOUNTER — Ambulatory Visit: Payer: Self-pay

## 2020-12-31 DIAGNOSIS — E1122 Type 2 diabetes mellitus with diabetic chronic kidney disease: Secondary | ICD-10-CM

## 2020-12-31 DIAGNOSIS — M793 Panniculitis, unspecified: Secondary | ICD-10-CM

## 2020-12-31 DIAGNOSIS — F419 Anxiety disorder, unspecified: Secondary | ICD-10-CM

## 2020-12-31 DIAGNOSIS — M7989 Other specified soft tissue disorders: Secondary | ICD-10-CM

## 2020-12-31 DIAGNOSIS — I82452 Acute embolism and thrombosis of left peroneal vein: Secondary | ICD-10-CM

## 2020-12-31 DIAGNOSIS — E66812 Obesity, class 2: Secondary | ICD-10-CM

## 2020-12-31 DIAGNOSIS — Z6836 Body mass index (BMI) 36.0-36.9, adult: Secondary | ICD-10-CM

## 2020-12-31 DIAGNOSIS — N183 Chronic kidney disease, stage 3 unspecified: Secondary | ICD-10-CM

## 2020-12-31 DIAGNOSIS — Z794 Long term (current) use of insulin: Secondary | ICD-10-CM

## 2020-12-31 DIAGNOSIS — I129 Hypertensive chronic kidney disease with stage 1 through stage 4 chronic kidney disease, or unspecified chronic kidney disease: Secondary | ICD-10-CM

## 2020-12-31 NOTE — Telephone Encounter (Signed)
Sharyn Lull with [facility] is requesting confirmation on seeing patient 3x a week, including this Friday, for wound care. Please call Sharyn Lull to advise [phone]. Thank you.

## 2021-01-01 ENCOUNTER — Ambulatory Visit: Payer: Medicare HMO | Admitting: Physician Assistant

## 2021-01-01 NOTE — Telephone Encounter (Signed)
Yes, that is appropriate. Thank you

## 2021-01-01 NOTE — Patient Instructions (Signed)
Visit Information   PATIENT GOALS/PLAN OF CARE:   Care Plan : RN Care Manager Plan of Care  Updates made by Lynne Logan, RN since 12/31/2020 12:00 AM     Problem: No plan of care established for management of chronic disease states (DM, CKD III, Hypertensive Nephropathy, Class 2 severe obesity, Anxiety, Panniculitis, localized swelling of lower extremity)   Priority: High     Long-Range Goal: Development of plan for management of chronic disease management for DM, CKD III, Hypertensive Nephropathy, Class 2 severe obesity, Anxiety, Panniculitis, localized swelling of lower extremity   Start Date: 12/31/2020  Expected End Date: 12/31/2021  This Visit's Progress: On track  Priority: High  Note:   Current Barriers:  Knowledge Deficits related to plan of care for management of DM, CKD III, Hypertensive Nephropathy, Class 2 severe obesity, Anxiety, Panniculitis, localized swelling of lower extremity Chronic Disease Management support and education needs related to DM, CKD III, Hypertensive Nephropathy, Class 2 severe obesity, Anxiety, Panniculitis, localized swelling of lower extremity  RNCM Clinical Goal(s):  Patient will verbalize basic understanding of  DM, CKD III, Hypertensive Nephropathy, Class 2 severe obesity, Anxiety, Panniculitis, localized swelling of lower extremity disease process and self health management plan   demonstrate Improved health management independence   continue to work with RN Care Manager to address care management and care coordination needs related to  DM, CKD III, Hypertensive Nephropathy, Class 2 severe obesity, Anxiety, Panniculitis, localized swelling of lower extremity will demonstrate ongoing self health care management ability    through collaboration with RN Care manager, provider, and care team.  Interventions: 1:1 collaboration with primary care provider regarding development and update of comprehensive plan of care as evidenced by provider attestation  and co-signature Inter-disciplinary care team collaboration (see longitudinal plan of care) Evaluation of current treatment plan related to  self management and patient's adherence to plan as established by provider  Hypertension Interventions: Last practice recorded BP readings:  BP Readings from Last 3 Encounters:  12/25/20 120/68  11/22/20 134/68  11/07/20 (!) 157/61  Most recent eGFR/CrCl:  Lab Results  Component Value Date   EGFR 46 (L) 12/25/2020    No components found for: CRCL  Evaluation of current treatment plan related to hypertension self management and patient's adherence to plan as established by provider; Reviewed medications with patient and discussed importance of compliance; Counseled on the importance of exercise goals with target of 150 minutes per week Discussed plans with patient for ongoing care management follow up and provided patient with direct contact information for care management team; Advised patient, providing education and rationale, to monitor blood pressure daily and record, calling PCP for findings outside established parameters;  Provided education on prescribed diet low Sodium;  Discussed complications of poorly controlled blood pressure such as heart disease, stroke, circulatory complications, vision complications, kidney impairment, sexual dysfunction;  Assessed social determinant of health barriers;  Goal on track:  Yes Evaluation of current treatment plan related to  Abdominal wound , self-management and patient's adherence to plan as established by provider. Discussed plans with patient for ongoing care management follow up and provided patient with direct contact information for care management team Provided education to patient re: signs and symptoms suggestive of wound infection and when to call the doctor; Discussed plans with patient for ongoing care management follow up and provided patient with direct contact information for care  management team; Screening for signs and symptoms of depression related to chronic disease state;  Diabetes Interventions: Assessed patient's understanding of A1c goal: <6.5% Provided education to patient about basic DM disease process; Reviewed medications with patient and discussed importance of medication adherence; Counseled on importance of regular laboratory monitoring as prescribed; Discussed plans with patient for ongoing care management follow up and provided patient with direct contact information for care management team; Advised patient, providing education and rationale, to check cbg daily before meals and at bedtime and record, calling PCP and or RN CM for findings outside established parameters; Review of patient status, including review of consultants reports, relevant laboratory and other test results, and medications completed; Educated on hypoglycemic/hyperglycemic protocol and how to self manage, when to call the PCP Lab Results  Component Value Date   HGBA1C 6.3 (H) 12/25/2020  Goal on track:  Yes Evaluation of current treatment plan related to  DVT of LE , self-management and patient's adherence to plan as established by provider. Discussed plans with patient for ongoing care management follow up and provided patient with direct contact information for care management team Provided education to patient re: do not take NSAIDs or Aspirin while on Xarelto; contact PCP and or seek medication attention promptly if you have a fall and or bump your head while taking Xarelto; report any/all unusal bleeding; Reviewed medications with patient and discussed importance of taking this medication ecactly as prescribed without missed doses unless directed by PCP; Discussed plans with patient for ongoing care management follow up and provided patient with direct contact information for care management team; Screening for signs and symptoms of depression related to chronic disease state;   Assessed social determinant of health barriers;   Patient Goals/Self-Care Activities: Patient will self administer medications as prescribed Patient will attend all scheduled provider appointments Patient will call pharmacy for medication refills Patient will attend church or other social activities Patient will continue to perform ADL's independently Patient will continue to perform IADL's independently Patient will call provider office for new concerns or questions  Follow Up Plan:  Telephone follow up appointment with care management team member scheduled for:  02/15/21      Consent to CCM Services: Ms. Gomer was given information about Chronic Care Management services including:  CCM service includes personalized support from designated clinical staff supervised by her physician, including individualized plan of care and coordination with other care providers 24/7 contact phone numbers for assistance for urgent and routine care needs. Service will only be billed when office clinical staff spend 20 minutes or more in a month to coordinate care. Only one practitioner may furnish and bill the service in a calendar month. The patient may stop CCM services at any time (effective at the end of the month) by phone call to the office staff. The patient will be responsible for cost sharing (co-pay) of up to 20% of the service fee (after annual deductible is met).  Patient agreed to services and verbal consent obtained.   The patient verbalized understanding of instructions, educational materials, and care plan provided today and declined offer to receive copy of patient instructions, educational materials, and care plan.   Telephone follow up appointment with care management team member scheduled for: 02/15/21  Barb Merino, RN, BSN, CCM Care Management Coordinator [place]  Direct Phone: [phone]

## 2021-01-01 NOTE — Telephone Encounter (Signed)
Returned nurses call. Verified with Donna Christen, ok to go back to 3 visits a week. Vicenta Dunning the updated instructions. Sharyn Lull understood and agreed with plan.

## 2021-01-01 NOTE — Chronic Care Management (AMB) (Signed)
Chronic Care Management   CCM RN Visit Note  12/31/2020 Name: Teresa Golden MRN: [id number] DOB: 14-Apr-1945  Subjective: Teresa Golden is a 75 y.o. year old female who is a primary care patient of Glendale Chard, MD. The care management team was consulted for assistance with disease management and care coordination needs.    Engaged with patient by telephone for follow up visit in response to provider referral for case management and/or care coordination services.   Consent to Services:  The patient was given information about Chronic Care Management services, agreed to services, and gave verbal consent prior to initiation of services.  Please see initial visit note for detailed documentation.   Patient agreed to services and verbal consent obtained.   Assessment: Review of patient past medical history, allergies, medications, health status, including review of consultants reports, laboratory and other test data, was performed as part of comprehensive evaluation and provision of chronic care management services.   SDOH (Social Determinants of Health) assessments and interventions performed:  Yes, no acute needs   CCM Care Plan  Allergies  Allergen Reactions   Meloxicam Other (See Comments)    Fever; muscle aches; "flu-like" symptoms "Allergic," per [facility]   Other Other (See Comments)    "Rose fever and hay fever"    Outpatient Encounter Medications as of 12/31/2020  Medication Sig   acetaminophen (TYLENOL) 325 MG tablet Take 2 tablets (650 mg total) by mouth every 6 (six) hours as needed for moderate pain or mild pain.   amLODipine (NORVASC) 5 MG tablet Take 1 tablet (5 mg total) by mouth in the morning.   B Complex-C (B-COMPLEX WITH VITAMIN C) tablet Take 1 tablet by mouth daily.   Biotin 10000 MCG TABS Take 10,000 mcg by mouth daily.   Cholecalciferol 5000 units TABS Take 5,000 Units by mouth daily.   colchicine 0.6 MG tablet Take 1 tablet (0.6 mg total) by mouth daily for 7 days.    Cranberry-Vitamin C 250-60 MG CAPS Take 1 capsule by mouth daily.   diclofenac Sodium (VOLTAREN) 1 % GEL APPLY 2 GRAMS TO AFFECTED AREA(S) 4 TIMES DAILY AS NEEDED (Patient taking differently: Apply 2-4 g topically every 6 (six) hours as needed (to affected areas).)   gabapentin (NEURONTIN) 300 MG capsule Take 600 mg by mouth at bedtime.   glucose blood (ONETOUCH VERIO) test strip Use as instructed to check blood sugars 2 times per day dx: e11.65   hydrochlorothiazide (HYDRODIURIL) 25 MG tablet Take 1 tablet (25 mg total) by mouth daily.   insulin degludec (TRESIBA) 100 UNIT/ML FlexTouch Pen Inject 5 Units into the skin at bedtime.   metoprolol succinate (TOPROL-XL) 25 MG 24 hr tablet Take 1 tablet by mouth daily at bedtime   Multiple Vitamins-Minerals (MULTIVITAMIN WITH MINERALS) tablet Take 1 tablet by mouth daily. Women's 76+   OneTouch Delica Lancets 80S MISC Use as instructed to check blood sugars 2 times per day dx: e11.65   polyethylene glycol (MIRALAX / GLYCOLAX) 17 g packet Take 17 g by mouth daily.   rivaroxaban (XARELTO) 20 MG TABS tablet Take 1 tablet (20 mg total) by mouth daily with supper.   Semaglutide, 1 MG/DOSE, (OZEMPIC, 1 MG/DOSE,) 4 MG/3ML SOPN Inject into the skin.   simvastatin (ZOCOR) 10 MG tablet Take 1 tablet (10 mg total) by mouth daily. (Patient taking differently: Take 10 mg by mouth at bedtime.)   SYNTHROID 112 MCG tablet Take 112 mcg by mouth daily before breakfast.   [DISCONTINUED] SEMGLEE,  YFGN, 100 UNIT/ML Pen Inject 9 Units into the skin at bedtime.   No facility-administered encounter medications on file as of 12/31/2020.    Patient Active Problem List   Diagnosis Date Noted   Infected wound 11/02/2020   Pressure injury of skin 10/22/2020   Wound infection after surgery 10/12/2020   Normocytic anemia 10/12/2020   AKI (acute kidney injury) ([place]) 10/12/2020   Obesity (BMI 30-39.9) 10/12/2020   Sepsis ([place]) 10/12/2020   Type 2 diabetes mellitus with stage  3 chronic kidney disease, with long-term current use of insulin ([place]) 11/29/2019   Panniculitis 09/30/2019   Back pain 09/30/2019   Hepatoma ([place]) 01/21/2019   Other cirrhosis of liver ([place]) 07/05/2018   Hypertensive nephropathy 07/05/2018   Chronic renal disease, stage III ([place]) 07/05/2018   Chronic left shoulder pain 07/05/2018   Hypothyroidism 02/06/2018   Hepatitis C virus infection cured after antiviral drug therapy 02/04/2018   Osteoarthritis of right knee 07/30/2017   S/P laparoscopic sleeve gastrectomy July 2018 08/26/2016   Preop cardiovascular exam 07/30/2016   Dyspnea on exertion 07/30/2016   Bilateral lower extremity edema 07/30/2016    Conditions to be addressed/monitored: DM, CKD III, Hypertensive Nephropathy, Class 2 severe obesity, Anxiety, Panniculitis, localized swelling of lower extremity  Care Plan : RN Care Manager Plan of Care  Updates made by Lynne Logan, RN since 12/31/2020 12:00 AM     Problem: No plan of care established for management of chronic disease states (DM, CKD III, Hypertensive Nephropathy, Class 2 severe obesity, Anxiety, Panniculitis, localized swelling of lower extremity)   Priority: High     Long-Range Goal: Development of plan for management of chronic disease management for DM, CKD III, Hypertensive Nephropathy, Class 2 severe obesity, Anxiety, Panniculitis, localized swelling of lower extremity   Start Date: 12/31/2020  Expected End Date: 12/31/2021  This Visit's Progress: On track  Priority: High  Note:   Current Barriers:  Knowledge Deficits related to plan of care for management of DM, CKD III, Hypertensive Nephropathy, Class 2 severe obesity, Anxiety, Panniculitis, localized swelling of lower extremity Chronic Disease Management support and education needs related to DM, CKD III, Hypertensive Nephropathy, Class 2 severe obesity, Anxiety, Panniculitis, localized swelling of lower extremity  RNCM Clinical Goal(s):  Patient will  verbalize basic understanding of  DM, CKD III, Hypertensive Nephropathy, Class 2 severe obesity, Anxiety, Panniculitis, localized swelling of lower extremity disease process and self health management plan   demonstrate Improved health management independence   continue to work with RN Care Manager to address care management and care coordination needs related to  DM, CKD III, Hypertensive Nephropathy, Class 2 severe obesity, Anxiety, Panniculitis, localized swelling of lower extremity will demonstrate ongoing self health care management ability    through collaboration with RN Care manager, provider, and care team.  Interventions: 1:1 collaboration with primary care provider regarding development and update of comprehensive plan of care as evidenced by provider attestation and co-signature Inter-disciplinary care team collaboration (see longitudinal plan of care) Evaluation of current treatment plan related to  self management and patient's adherence to plan as established by provider  Hypertension Interventions: Last practice recorded BP readings:  BP Readings from Last 3 Encounters:  12/25/20 120/68  11/22/20 134/68  11/07/20 (!) 157/61  Most recent eGFR/CrCl:  Lab Results  Component Value Date   EGFR 46 (L) 12/25/2020    No components found for: CRCL  Evaluation of current treatment plan related to hypertension self management and patient's adherence to  plan as established by provider; Reviewed medications with patient and discussed importance of compliance; Counseled on the importance of exercise goals with target of 150 minutes per week Discussed plans with patient for ongoing care management follow up and provided patient with direct contact information for care management team; Advised patient, providing education and rationale, to monitor blood pressure daily and record, calling PCP for findings outside established parameters;  Provided education on prescribed diet low Sodium;   Discussed complications of poorly controlled blood pressure such as heart disease, stroke, circulatory complications, vision complications, kidney impairment, sexual dysfunction;  Assessed social determinant of health barriers;  Goal on track:  Yes Evaluation of current treatment plan related to  Abdominal wound , self-management and patient's adherence to plan as established by provider. Discussed plans with patient for ongoing care management follow up and provided patient with direct contact information for care management team Provided education to patient re: signs and symptoms suggestive of wound infection and when to call the doctor; Discussed plans with patient for ongoing care management follow up and provided patient with direct contact information for care management team; Screening for signs and symptoms of depression related to chronic disease state;   Diabetes Interventions: Assessed patient's understanding of A1c goal: <6.5% Provided education to patient about basic DM disease process; Reviewed medications with patient and discussed importance of medication adherence; Counseled on importance of regular laboratory monitoring as prescribed; Discussed plans with patient for ongoing care management follow up and provided patient with direct contact information for care management team; Advised patient, providing education and rationale, to check cbg daily before meals and at bedtime and record, calling PCP and or RN CM for findings outside established parameters; Review of patient status, including review of consultants reports, relevant laboratory and other test results, and medications completed; Educated on hypoglycemic/hyperglycemic protocol and how to self manage, when to call the PCP Lab Results  Component Value Date   HGBA1C 6.3 (H) 12/25/2020  Goal on track:  Yes Evaluation of current treatment plan related to  DVT of LE , self-management and patient's adherence to plan as  established by provider. Discussed plans with patient for ongoing care management follow up and provided patient with direct contact information for care management team Provided education to patient re: do not take NSAIDs or Aspirin while on Xarelto; contact PCP and or seek medication attention promptly if you have a fall and or bump your head while taking Xarelto; report any/all unusal bleeding; Reviewed medications with patient and discussed importance of taking this medication ecactly as prescribed without missed doses unless directed by PCP; Discussed plans with patient for ongoing care management follow up and provided patient with direct contact information for care management team; Screening for signs and symptoms of depression related to chronic disease state;  Assessed social determinant of health barriers;   Patient Goals/Self-Care Activities: Patient will self administer medications as prescribed Patient will attend all scheduled provider appointments Patient will call pharmacy for medication refills Patient will attend church or other social activities Patient will continue to perform ADL's independently Patient will continue to perform IADL's independently Patient will call provider office for new concerns or questions  Follow Up Plan:  Telephone follow up appointment with care management team member scheduled for:  02/15/21      Plan:Telephone follow up appointment with care management team member scheduled for:  02/15/21  Barb Merino, RN, BSN, CCM Care Management Coordinator [place] [facility]  Direct Phone: [phone]

## 2021-01-02 ENCOUNTER — Ambulatory Visit: Payer: Medicare HMO

## 2021-01-02 DIAGNOSIS — I129 Hypertensive chronic kidney disease with stage 1 through stage 4 chronic kidney disease, or unspecified chronic kidney disease: Secondary | ICD-10-CM

## 2021-01-02 DIAGNOSIS — Z794 Long term (current) use of insulin: Secondary | ICD-10-CM

## 2021-01-02 DIAGNOSIS — N183 Chronic kidney disease, stage 3 unspecified: Secondary | ICD-10-CM

## 2021-01-02 DIAGNOSIS — E1122 Type 2 diabetes mellitus with diabetic chronic kidney disease: Secondary | ICD-10-CM

## 2021-01-02 NOTE — Chronic Care Management (AMB) (Signed)
Chronic Care Golden    Social Work Note  01/02/2021 Name: Teresa Golden MRN: [id number] DOB: 20-Sep-1945  Teresa Golden is a 75 y.o. year old female who is a primary care patient of Glendale Chard, MD. The CCM team was consulted to assist the patient with chronic disease Golden and/or care coordination needs related to:  DM II, CKD III, HTN .   Engaged with patient by telephone for follow up visit in response to provider referral for social work chronic care Golden and care coordination services.   Consent to Services:  The patient was given information about Chronic Care Golden services, agreed to services, and gave verbal consent prior to initiation of services.  Please see initial visit note for detailed documentation.   Patient agreed to services and consent obtained.   Assessment: Review of patient past medical history, allergies, medications, and health status, including review of relevant consultants reports was performed today as part of a comprehensive evaluation and provision of chronic care Golden and care coordination services.     SDOH (Social Determinants of Health) assessments and interventions performed:    Advanced Directives Status: Not addressed in this encounter.  CCM Care Plan  Allergies  Allergen Reactions   Meloxicam Other (See Comments)    Fever; muscle aches; "flu-like" symptoms "Allergic," per [facility]   Other Other (See Comments)    "Rose fever and hay fever"    Outpatient Encounter Medications as of 01/02/2021  Medication Sig   acetaminophen (TYLENOL) 325 MG tablet Take 2 tablets (650 mg total) by mouth every 6 (six) hours as needed for moderate pain or mild pain.   amLODipine (NORVASC) 5 MG tablet Take 1 tablet (5 mg total) by mouth in the morning.   B Complex-C (B-COMPLEX WITH VITAMIN C) tablet Take 1 tablet by mouth daily.   Biotin 10000 MCG TABS Take 10,000 mcg by mouth daily.   Cholecalciferol 5000 units TABS Take 5,000 Units by  mouth daily.   colchicine 0.6 MG tablet Take 1 tablet (0.6 mg total) by mouth daily for 7 days.   Cranberry-Vitamin C 250-60 MG CAPS Take 1 capsule by mouth daily.   diclofenac Sodium (VOLTAREN) 1 % GEL APPLY 2 GRAMS TO AFFECTED AREA(S) 4 TIMES DAILY AS NEEDED (Patient taking differently: Apply 2-4 g topically every 6 (six) hours as needed (to affected areas).)   gabapentin (NEURONTIN) 300 MG capsule Take 600 mg by mouth at bedtime.   glucose blood (ONETOUCH VERIO) test strip Use as instructed to check blood sugars 2 times per day dx: e11.65   hydrochlorothiazide (HYDRODIURIL) 25 MG tablet Take 1 tablet (25 mg total) by mouth daily.   insulin degludec (TRESIBA) 100 UNIT/ML FlexTouch Pen Inject 5 Units into the skin at bedtime.   metoprolol succinate (TOPROL-XL) 25 MG 24 hr tablet Take 1 tablet by mouth daily at bedtime   Multiple Vitamins-Minerals (MULTIVITAMIN WITH MINERALS) tablet Take 1 tablet by mouth daily. Women's 72+   OneTouch Delica Lancets 53G MISC Use as instructed to check blood sugars 2 times per day dx: e11.65   polyethylene glycol (MIRALAX / GLYCOLAX) 17 g packet Take 17 g by mouth daily.   rivaroxaban (XARELTO) 20 MG TABS tablet Take 1 tablet (20 mg total) by mouth daily with supper.   Semaglutide, 1 MG/DOSE, (OZEMPIC, 1 MG/DOSE,) 4 MG/3ML SOPN Inject into the skin.   simvastatin (ZOCOR) 10 MG tablet Take 1 tablet (10 mg total) by mouth daily. (Patient taking differently: Take 10 mg by mouth  at bedtime.)   SYNTHROID 112 MCG tablet Take 112 mcg by mouth daily before breakfast.   [DISCONTINUED] SEMGLEE, YFGN, 100 UNIT/ML Pen Inject 9 Units into the skin at bedtime.   No facility-administered encounter medications on file as of 01/02/2021.    Patient Active Problem List   Diagnosis Date Noted   Infected wound 11/02/2020   Pressure injury of skin 10/22/2020   Wound infection after surgery 10/12/2020   Normocytic anemia 10/12/2020   AKI (acute kidney injury) ([place]) 10/12/2020    Obesity (BMI 30-39.9) 10/12/2020   Sepsis ([place]) 10/12/2020   Type 2 diabetes mellitus with stage 3 chronic kidney disease, with long-term current use of insulin ([place]) 11/29/2019   Panniculitis 09/30/2019   Back pain 09/30/2019   Hepatoma ([place]) 01/21/2019   Other cirrhosis of liver ([place]) 07/05/2018   Hypertensive nephropathy 07/05/2018   Chronic renal disease, stage III ([place]) 07/05/2018   Chronic left shoulder pain 07/05/2018   Hypothyroidism 02/06/2018   Hepatitis C virus infection cured after antiviral drug therapy 02/04/2018   Osteoarthritis of right knee 07/30/2017   S/P laparoscopic sleeve gastrectomy July 2018 08/26/2016   Preop cardiovascular exam 07/30/2016   Dyspnea on exertion 07/30/2016   Bilateral lower extremity edema 07/30/2016    Conditions to be addressed/monitored: HTN, DMII, and CKD Stage III  Care Plan : Social Work Care Plan  Updates made by Daneen Schick since 01/02/2021 12:00 AM     Problem: Care Coordination Resolved 01/02/2021     Long-Range Goal: Collaborate with RN Care Manager to perform appropriate assessments to assist with care coordination needs Completed 01/02/2021  Start Date: 03/02/2020  Recent Progress: On track  Priority: Low  Note:   Current Barriers:  Chronic conditions including DM II, CKD III, and HTN which put patient at increased risk of hospitalization Recent hospitalization due to wound infection port panniculectomy  Limited access to a caregiver  Social Work Clinical Goal(s):  patient will work with SW to address concerns related to DM II, CKD III, and HTN and manage care coordination needs Patient will follow the recommendations of her provider as evidenced by remaining in rehab until appropriate for discharge completed New: Patient will follow up with her primary care provider regarding concerns with hearing  Interventions: 1:1 collaboration with Glendale Chard, MD regarding development and update of comprehensive plan of care as  evidenced by provider attestation and co-signature Inter-disciplinary care team collaboration (see longitudinal plan of care) Successful outbound call placed to the patient to assess goal progression Discussed the patient has spoken with her primary care provider who indicates the shipments are behind but patient should receive over the next month - patient previously reported she had 4 doses left which should last until next shipment arrives Goal closed  Patient Goals/Self-Care Activities patient will:  -Engage with pharmacy team to address Ozempic questions -Contact SW as needed        Follow Up Plan: SW will follow up with patient by phone over the next month      Daneen Schick, BSW, CDP Social Worker, Certified Dementia Practitioner Teresa Golden [phone]

## 2021-01-02 NOTE — Patient Instructions (Signed)
Social Worker Visit Information  Goals we discussed today:   Goals Addressed             This Visit's Progress    COMPLETED: Work with SW to manage care coordination needs       Timeframe:  Long-Range Goal Priority:  [employer] Start Date:   12.16.21                           Patient Goals/Self-Care Activities patient will:  -Engage with pharmacy team to address Ozempic questions -Contact SW as needed          Materials Provided: No: Patient declined  Patient verbalizes understanding of instructions provided today and agrees to view in [place].   Follow Up Plan: SW will follow up with patient by phone over the next month   Daneen Schick, BSW, CDP Social Worker, Certified Dementia Practitioner [facility] [phone]

## 2021-01-03 ENCOUNTER — Ambulatory Visit: Payer: Medicare HMO | Admitting: Internal Medicine

## 2021-01-03 ENCOUNTER — Ambulatory Visit: Payer: Medicare HMO | Admitting: Addiction (Substance Use Disorder)

## 2021-01-07 NOTE — Progress Notes (Signed)
Referring Provider Glendale Chard, MD [address] [place],  [place] [address]   CC:  Chief Complaint  Patient presents with   Follow-up     Teresa Golden is an 75 y.o. female.  HPI: Patient is a 75 year old female with PMH of type II DM, HTN, CKD, and recent hospitalizations x2 for wound infection secondary to panniculectomy performed 09/26/2020 by Dr. Marla Roe.  She was seen most recently here in clinic on 12/28/2020.  At that time, it was noted that they were performing alginate dressing changes and she was pleased with her wound healing.  Physical exam was notable for multiple wounds appearing hypergranulating.  No erythema or cellulitic changes noted.  The areas of hypergranulation was cauterized in clinic using silver nitrate which patient tolerated without complication.  Recommended continued alginate/silver alginate dressings and hypergranulation cauterization as needed.  Today, she is doing well.  She reports that she has been assigned a new nurse because she had been doing so well and they have yet to secure herself or alginate dressings.  She presents with a last remaining piece here today.  She reports that the home health agency is actively working on it and will call us if she is unable to get the dressings needed.  She has not missed any dressing changes.  She feels as though her wounds have continued to heal.  She is overall quite pleased with her progress.  She continues to endorse multiple areas of hypergranulation and asked if we could send her home with silver nitrate swabs since that is not provided by her [facility].  She also complains of a couple sutures that have protruded through skin along her abdominal incision.  Patient reports that she feels deconditioned ever since the surgery, but that she is getting the assistance that she needs.  She denies any abdominal pain, fevers or chills, difficulty walking, or other symptoms.     Allergies  Allergen Reactions    Meloxicam Other (See Comments)    Fever; muscle aches; "flu-like" symptoms "Allergic," per [facility]   Other Other (See Comments)    "Rose fever and hay fever"    Outpatient Encounter Medications as of 01/08/2021  Medication Sig   acetaminophen (TYLENOL) 325 MG tablet Take 2 tablets (650 mg total) by mouth every 6 (six) hours as needed for moderate pain or mild pain.   amLODipine (NORVASC) 5 MG tablet Take 1 tablet (5 mg total) by mouth in the morning.   B Complex-C (B-COMPLEX WITH VITAMIN C) tablet Take 1 tablet by mouth daily.   Biotin 10000 MCG TABS Take 10,000 mcg by mouth daily.   Cholecalciferol 5000 units TABS Take 5,000 Units by mouth daily.   colchicine 0.6 MG tablet Take 1 tablet (0.6 mg total) by mouth daily for 7 days.   Cranberry-Vitamin C 250-60 MG CAPS Take 1 capsule by mouth daily.   diclofenac Sodium (VOLTAREN) 1 % GEL APPLY 2 GRAMS TO AFFECTED AREA(S) 4 TIMES DAILY AS NEEDED (Patient taking differently: Apply 2-4 g topically every 6 (six) hours as needed (to affected areas).)   gabapentin (NEURONTIN) 300 MG capsule Take 600 mg by mouth at bedtime.   glucose blood (ONETOUCH VERIO) test strip Use as instructed to check blood sugars 2 times per day dx: e11.65   hydrochlorothiazide (HYDRODIURIL) 25 MG tablet Take 1 tablet (25 mg total) by mouth daily.   [EXPIRED] insulin degludec (TRESIBA) 100 UNIT/ML FlexTouch Pen Inject 5 Units into the skin at bedtime.  metoprolol succinate (TOPROL-XL) 25 MG 24 hr tablet Take 1 tablet by mouth daily at bedtime   Multiple Vitamins-Minerals (MULTIVITAMIN WITH MINERALS) tablet Take 1 tablet by mouth daily. Women's 81+   OneTouch Delica Lancets 27N MISC Use as instructed to check blood sugars 2 times per day dx: e11.65   polyethylene glycol (MIRALAX / GLYCOLAX) 17 g packet Take 17 g by mouth daily.   rivaroxaban (XARELTO) 20 MG TABS tablet Take 1 tablet (20 mg total) by mouth daily with supper.   Semaglutide, 1 MG/DOSE, (OZEMPIC, 1 MG/DOSE,) 4  MG/3ML SOPN Inject into the skin.   simvastatin (ZOCOR) 10 MG tablet Take 1 tablet (10 mg total) by mouth daily. (Patient taking differently: Take 10 mg by mouth at bedtime.)   SYNTHROID 112 MCG tablet Take 112 mcg by mouth daily before breakfast.   [DISCONTINUED] SEMGLEE, YFGN, 100 UNIT/ML Pen Inject 9 Units into the skin at bedtime.   No facility-administered encounter medications on file as of 01/08/2021.     Past Medical History:  Diagnosis Date   Arthritis    Chronic kidney disease    self reports ckd stage 3    Depression    Diabetes mellitus, type II, insulin dependent ([place])    With neurologic complications. 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    Hypertensive nephropathy 07/05/2018   Hypothyroidism    Hypothyroidism 02/06/2018   Morbid obesity with BMI of 50.0-59.9, adult ([place])    Scoliosis     Past Surgical History:  Procedure Laterality Date   ABDOMINAL HYSTERECTOMY     uterus   CHOLECYSTECTOMY     DIAGNOSTIC LAPAROSCOPY     DILATION AND CURETTAGE OF UTERUS     EYE SURGERY     cataract extraction bilateral    INCISION AND DRAINAGE OF WOUND N/A 10/15/2020   Procedure: IRRIGATION AND DEBRIDEMENT WOUND;  Surgeon: Wallace Going, DO;  Location: [place];  Service: Plastics;  Laterality: N/A;  60 min   JOINT REPLACEMENT     Left knee   KNEE ARTHROPLASTY Right 07/30/2017   Procedure: RIGHT TOTAL KNEE ARTHROPLASTY WITH COMPUTER NAVIGATION;  Surgeon: Rod Can, MD;  Location: [facility];  Service: Orthopedics;  Laterality: Right;  Needs RNFA   LAPAROSCOPIC GASTRIC BANDING     and reversal   LAPAROSCOPIC GASTRIC SLEEVE RESECTION N/A 08/26/2016   Procedure: LAPAROSCOPIC GASTRIC SLEEVE RESECTION WITH UPPER ENOD;  Surgeon:  Johnathan Hausen, MD;  Location: [facility];  Service: General;  Laterality: N/A;   PANNICULECTOMY N/A 09/26/2020   Procedure: PANNICULECTOMY;  Surgeon: Wallace Going, DO;  Location: [place];  Service: Plastics;  Laterality: N/A;   TONSILLECTOMY     TRANSTHORACIC ECHOCARDIOGRAM  11/2013   EF 65-70%. Normal diastolic Fxn.  Normal Valves.   UMBILICAL HERNIA REPAIR N/A 09/26/2020   Procedure: HERNIA REPAIR UMBILICAL ADULT;  Surgeon: Georganna Skeans, MD;  Location: [place];  Service: General;  Laterality: N/A;    Family History  Problem Relation Age of Onset   Heart attack Mother    Heart failure Mother    Stroke Father     Social History   Social History Narrative   Not on file     Review of Systems  General: Denies fevers or chills Abdomen: Denies distention. Skin: Endorses hypergranulation and mild drainage.  Denies purulent drainage or worsening erythema/pain.  Physical Exam Vitals with BMI 12/25/2020 11/22/2020 11/07/2020  Height 5\' 5"  5\' 5"  -  Weight 181 lbs 3 oz 200 lbs 3 oz -  BMI 47.65 46.50 -  Systolic 354 656 812  Diastolic 68 68 61  Pulse 87 84 59    General:  No acute distress, nontoxic appearing  Respiratory: No increased work of breathing Skin: Good wound healing noted throughout lower abdomen.  There are multiple areas of hypergranulation noted.  There is a small wound with drainage over right lateral aspect of incision as well as one immediately left of the midline.  The left lateral aspect of the incision is seemingly entirely healed.  Umbilicus without any wounds noted.  2 small sutures noted over right lateral aspect of incision protruding from skin removed without complication.  No cellulitic findings noted.  Drainage is clearish yellow. Neuro: Alert and oriented Psychiatric: Normal mood and affect   Assessment/Plan  Patient is multiple areas of hypergranulation was chemically cauterized again here in clinic without complication or difficulty.  She has small wounds  noted, but her incision is looking increasingly improved with the week.  Emphasized the importance of continued silver alginate dressings 3 times per week.  She feels confident she can get that facilitated through her home health agency.  She will call us should she develop any difficulty or concerns.  We will not send her home with silver nitrate swabs and instead will continue to apply here in clinic as needed.  We will see her again in 2 weeks.  She is without any systemic symptoms and feels clinically improved.  Her exam is reassuring without any obvious infection noted.  Suspect that her wounds will continue to heal.   Krista Blue 01/08/2021, 3:09 PM

## 2021-01-08 ENCOUNTER — Other Ambulatory Visit: Payer: Self-pay

## 2021-01-08 ENCOUNTER — Ambulatory Visit (INDEPENDENT_AMBULATORY_CARE_PROVIDER_SITE_OTHER): Payer: Medicare HMO | Admitting: Physician Assistant

## 2021-01-08 DIAGNOSIS — Z9889 Other specified postprocedural states: Secondary | ICD-10-CM | POA: Diagnosis not present

## 2021-01-10 ENCOUNTER — Ambulatory Visit: Payer: Medicare HMO

## 2021-01-10 ENCOUNTER — Telehealth: Payer: Self-pay

## 2021-01-10 DIAGNOSIS — N183 Chronic kidney disease, stage 3 unspecified: Secondary | ICD-10-CM

## 2021-01-10 DIAGNOSIS — N182 Chronic kidney disease, stage 2 (mild): Secondary | ICD-10-CM

## 2021-01-10 NOTE — Chronic Care Management (AMB) (Signed)
    Chronic Care Management Pharmacy Assistant   Name: Teresa Golden  MRN: [id number] DOB: 20-Jul-1945  Reason for Encounter: Patient Assistance Coordination  01/10/2021- 3154 Renewal application filled out for [employer] with [employer] patient assistance program. Mailed application to patient.  Medications: Outpatient Encounter Medications as of 01/10/2021  Medication Sig   acetaminophen (TYLENOL) 325 MG tablet Take 2 tablets (650 mg total) by mouth every 6 (six) hours as needed for moderate pain or mild pain.   amLODipine (NORVASC) 5 MG tablet Take 1 tablet (5 mg total) by mouth in the morning.   B Complex-C (B-COMPLEX WITH VITAMIN C) tablet Take 1 tablet by mouth daily.   Biotin 10000 MCG TABS Take 10,000 mcg by mouth daily.   Cholecalciferol 5000 units TABS Take 5,000 Units by mouth daily.   colchicine 0.6 MG tablet Take 1 tablet (0.6 mg total) by mouth daily for 7 days.   Cranberry-Vitamin C 250-60 MG CAPS Take 1 capsule by mouth daily.   diclofenac Sodium (VOLTAREN) 1 % GEL APPLY 2 GRAMS TO AFFECTED AREA(S) 4 TIMES DAILY AS NEEDED (Patient taking differently: Apply 2-4 g topically every 6 (six) hours as needed (to affected areas).)   gabapentin (NEURONTIN) 300 MG capsule Take 600 mg by mouth at bedtime.   glucose blood (ONETOUCH VERIO) test strip Use as instructed to check blood sugars 2 times per day dx: e11.65   hydrochlorothiazide (HYDRODIURIL) 25 MG tablet Take 1 tablet (25 mg total) by mouth daily.   metoprolol succinate (TOPROL-XL) 25 MG 24 hr tablet Take 1 tablet by mouth daily at bedtime   Multiple Vitamins-Minerals (MULTIVITAMIN WITH MINERALS) tablet Take 1 tablet by mouth daily. Women's 00+   OneTouch Delica Lancets 86P MISC Use as instructed to check blood sugars 2 times per day dx: e11.65   polyethylene glycol (MIRALAX / GLYCOLAX) 17 g packet Take 17 g by mouth daily.   rivaroxaban (XARELTO) 20 MG TABS tablet Take 1 tablet (20 mg total) by mouth daily with supper.    Semaglutide, 1 MG/DOSE, (OZEMPIC, 1 MG/DOSE,) 4 MG/3ML SOPN Inject into the skin.   simvastatin (ZOCOR) 10 MG tablet Take 1 tablet (10 mg total) by mouth daily. (Patient taking differently: Take 10 mg by mouth at bedtime.)   SYNTHROID 112 MCG tablet Take 112 mcg by mouth daily before breakfast.   [DISCONTINUED] SEMGLEE, YFGN, 100 UNIT/ML Pen Inject 9 Units into the skin at bedtime.   No facility-administered encounter medications on file as of 01/10/2021.   [name], [place] Pharmacist Assistant [phone]

## 2021-01-10 NOTE — Chronic Care Management (AMB) (Signed)
Chronic Care Management    Social Work Note  01/10/2021 Name: Teresa Golden MRN: [id number] DOB: 1945-10-15  Teresa Golden is a 75 y.o. year old female who is a primary care patient of Glendale Chard, MD. The CCM team was consulted to assist the patient with chronic disease management and/or care coordination needs related to:  DM II, CKD III, HTN .   Engaged with patient by telephone for follow up visit in response to provider referral for social work chronic care management and care coordination services.   Consent to Services:  The patient was given information about Chronic Care Management services, agreed to services, and gave verbal consent prior to initiation of services.  Please see initial visit note for detailed documentation.   Patient agreed to services and consent obtained.   Assessment: Review of patient past medical history, allergies, medications, and health status, including review of relevant consultants reports was performed today as part of a comprehensive evaluation and provision of chronic care management and care coordination services.     SDOH (Social Determinants of Health) assessments and interventions performed:    Advanced Directives Status: Not addressed in this encounter.  CCM Care Plan  Allergies  Allergen Reactions   Meloxicam Other (See Comments)    Fever; muscle aches; "flu-like" symptoms "Allergic," per [facility]   Other Other (See Comments)    "Rose fever and hay fever"    Outpatient Encounter Medications as of 01/10/2021  Medication Sig   acetaminophen (TYLENOL) 325 MG tablet Take 2 tablets (650 mg total) by mouth every 6 (six) hours as needed for moderate pain or mild pain.   amLODipine (NORVASC) 5 MG tablet Take 1 tablet (5 mg total) by mouth in the morning.   B Complex-C (B-COMPLEX WITH VITAMIN C) tablet Take 1 tablet by mouth daily.   Biotin 10000 MCG TABS Take 10,000 mcg by mouth daily.   Cholecalciferol 5000 units TABS Take 5,000 Units by  mouth daily.   colchicine 0.6 MG tablet Take 1 tablet (0.6 mg total) by mouth daily for 7 days.   Cranberry-Vitamin C 250-60 MG CAPS Take 1 capsule by mouth daily.   diclofenac Sodium (VOLTAREN) 1 % GEL APPLY 2 GRAMS TO AFFECTED AREA(S) 4 TIMES DAILY AS NEEDED (Patient taking differently: Apply 2-4 g topically every 6 (six) hours as needed (to affected areas).)   gabapentin (NEURONTIN) 300 MG capsule Take 600 mg by mouth at bedtime.   glucose blood (ONETOUCH VERIO) test strip Use as instructed to check blood sugars 2 times per day dx: e11.65   hydrochlorothiazide (HYDRODIURIL) 25 MG tablet Take 1 tablet (25 mg total) by mouth daily.   metoprolol succinate (TOPROL-XL) 25 MG 24 hr tablet Take 1 tablet by mouth daily at bedtime   Multiple Vitamins-Minerals (MULTIVITAMIN WITH MINERALS) tablet Take 1 tablet by mouth daily. Women's 15+   OneTouch Delica Lancets 40G MISC Use as instructed to check blood sugars 2 times per day dx: e11.65   polyethylene glycol (MIRALAX / GLYCOLAX) 17 g packet Take 17 g by mouth daily.   rivaroxaban (XARELTO) 20 MG TABS tablet Take 1 tablet (20 mg total) by mouth daily with supper.   Semaglutide, 1 MG/DOSE, (OZEMPIC, 1 MG/DOSE,) 4 MG/3ML SOPN Inject into the skin.   simvastatin (ZOCOR) 10 MG tablet Take 1 tablet (10 mg total) by mouth daily. (Patient taking differently: Take 10 mg by mouth at bedtime.)   SYNTHROID 112 MCG tablet Take 112 mcg by mouth daily before breakfast.   [  DISCONTINUED] SEMGLEE, YFGN, 100 UNIT/ML Pen Inject 9 Units into the skin at bedtime.   No facility-administered encounter medications on file as of 01/10/2021.    Patient Active Problem List   Diagnosis Date Noted   Infected wound 11/02/2020   Pressure injury of skin 10/22/2020   Wound infection after surgery 10/12/2020   Normocytic anemia 10/12/2020   AKI (acute kidney injury) ([place]) 10/12/2020   Obesity (BMI 30-39.9) 10/12/2020   Sepsis ([place]) 10/12/2020   Type 2 diabetes mellitus with  stage 3 chronic kidney disease, with long-term current use of insulin ([place]) 11/29/2019   Panniculitis 09/30/2019   Back pain 09/30/2019   Hepatoma ([place]) 01/21/2019   Other cirrhosis of liver ([place]) 07/05/2018   Hypertensive nephropathy 07/05/2018   Chronic renal disease, stage III ([place]) 07/05/2018   Chronic left shoulder pain 07/05/2018   Hypothyroidism 02/06/2018   Hepatitis C virus infection cured after antiviral drug therapy 02/04/2018   Osteoarthritis of right knee 07/30/2017   S/P laparoscopic sleeve gastrectomy July 2018 08/26/2016   Preop cardiovascular exam 07/30/2016   Dyspnea on exertion 07/30/2016   Bilateral lower extremity edema 07/30/2016    Conditions to be addressed/monitored: HTN, DMII, and CKD Stage III  Care Plan : Social Work Care Plan  Updates made by Daneen Schick since 01/10/2021 12:00 AM     Problem: Mobility and Independence      Goal: Mobility and Independence Optimized   Start Date: 11/09/2020  Expected End Date: 12/24/2020  Recent Progress: On track  Priority: High  Note:   Current Barriers:  Chronic disease management support and education needs related to HTN, DM, and CKD Stage III   Recent hospitalization due to wound infection following panniculectomy Transportation Social Worker Clinical Goal(s):  patient will work with SW to identify and address any acute and/or chronic care coordination needs related to the self health management of HTN, DM, and CKD Stage III   patient will work with SW to address concerns related to care coordination needs post hospitalization  SW Interventions:  Inter-disciplinary care team collaboration (see longitudinal plan of care) Collaboration with Glendale Chard, MD regarding development and update of comprehensive plan of care as evidenced by provider attestation and co-signature Telephonic visit completed with the patient to assist with care coordination needs Discussed patient has yet to receive her Ozempic  refill and is concerned she will run out of medication Patient has two doses left - she takes 1 dose each Saturday Determined patient would like assistance determining shipment status as well as what she will need to do if she runs out of medication  Advised the patient SW would collaborate with the pharmacy team to request follow up to address medication questions Collaboration with [name] PharmD advising patient requests a call from a member of the pharmacy team to discuss Ozempic  Patient Goals/Self-Care Activities patient will:  - Engage with pharmacy team to address medication related questions  - Speak with PT/OT to assess for in home equipment needs -Contact [facility] as needed to schedule transportation for upcomming appointments -Contact SW as needed  Follow Up Plan:  SW will follow up with the patient over the next 30 days       Follow Up Plan: SW will follow up with patient by phone over the next month      Daneen Schick, BSW, CDP Social Worker, Certified Dementia Practitioner [place] [facility] [place] [facility] [phone]

## 2021-01-10 NOTE — Patient Instructions (Signed)
Social Worker Visit Information  Goals we discussed today:   Goals Addressed             This Visit's Progress    Mobility and Independence Optimized       Timeframe:  Short-Term Goal Priority:  High Start Date:  9.16.22                           Expected End Date:    10.31.22                   Next planned outreach: 12.7.22  Patient Goals/Self-Care Activities patient will:  - Engage with pharmacy team to address medication related questions  - Speak with PT/OT to assess for in home equipment needs -Contact [facility] as needed to schedule transportation for upcomming appointments -Contact SW as needed          Patient verbalizes understanding of instructions provided today and agrees to view in [place].   Follow Up Plan: SW will follow up with patient by phone over the next month   Daneen Schick, BSW, CDP Social Worker, Certified Dementia Practitioner [place] [facility] [place] [facility] [phone]

## 2021-01-11 ENCOUNTER — Ambulatory Visit: Payer: Medicare HMO | Admitting: Physician Assistant

## 2021-01-15 ENCOUNTER — Ambulatory Visit: Payer: Medicare HMO

## 2021-01-15 ENCOUNTER — Ambulatory Visit: Payer: Medicare HMO | Admitting: Addiction (Substance Use Disorder)

## 2021-01-16 ENCOUNTER — Telehealth (INDEPENDENT_AMBULATORY_CARE_PROVIDER_SITE_OTHER): Payer: Medicare HMO | Admitting: Nurse Practitioner

## 2021-01-16 ENCOUNTER — Encounter: Payer: Self-pay | Admitting: Nurse Practitioner

## 2021-01-16 ENCOUNTER — Telehealth: Payer: Self-pay

## 2021-01-16 VITALS — BP 154/80 | HR 81 | Temp 98.8°F | Ht 65.0 in | Wt 181.0 lb

## 2021-01-16 DIAGNOSIS — R0981 Nasal congestion: Secondary | ICD-10-CM

## 2021-01-16 DIAGNOSIS — J069 Acute upper respiratory infection, unspecified: Secondary | ICD-10-CM | POA: Diagnosis not present

## 2021-01-16 NOTE — Progress Notes (Addendum)
Phone Visit due to failed video call    This visit type was conducted due to national recommendations for restrictions regarding the COVID-19 Pandemic (e.g. social distancing) in an effort to limit this patient's exposure and mitigate transmission in our community.  Due to her co-morbid illnesses, this patient is at least at moderate risk for complications without adequate follow up.  This format is felt to be most appropriate for this patient at this time.  All issues noted in this document were discussed and addressed.  A limited physical exam was performed with this format.    This visit type was conducted due to national recommendations for restrictions regarding the COVID-19 Pandemic (e.g. social distancing) in an effort to limit this patient's exposure and mitigate transmission in our community.  Patients identity confirmed using two different identifiers.  This format is felt to be most appropriate for this patient at this time.  All issues noted in this document were discussed and addressed.  No physical exam was performed (except for noted visual exam findings with Video Visits).    Date:  01/16/2021   ID:  Teresa Golden, DOB 1945-06-08, MRN [id number]  Patient Location:  Home   Provider location:   Office  Chief Complaint:  Nasal congestion.   History of Present Illness:    Teresa Golden is a 75 y.o. female who presents via phone conferencing for a telehealth visit today.    Slight fever yesterday. Took tylenol. Bodyaches, 98.8. Nasal congestion. No sore throat. No cough. COVID is neg. She is vaccinated with the flu and covid. Her symptoms started yesterday. She is feeling a little better today. Denies SOB, chest pain. She had surgery in Aug.     Past Medical History:  Diagnosis Date   Arthritis    Chronic kidney disease    self reports ckd stage 3    Depression    Diabetes mellitus, type II, insulin dependent ([place])    With neurologic complications. Bilateral lower  extremity peripheral neuropathy   Dyspnea    with excertion; no issues now since weight loss surgery    Endometriosis    Essential hypertension    GERD (gastroesophageal reflux disease)    Hepatitis C    C dormant; states she is in remission since taking Harvoni    Hypertensive nephropathy 07/05/2018   Hypothyroidism    Hypothyroidism 02/06/2018   Morbid obesity with BMI of 50.0-59.9, adult ([place])    Scoliosis    Past Surgical History:  Procedure Laterality Date   ABDOMINAL HYSTERECTOMY     uterus   CHOLECYSTECTOMY     DIAGNOSTIC LAPAROSCOPY     DILATION AND CURETTAGE OF UTERUS     EYE SURGERY     cataract extraction bilateral    INCISION AND DRAINAGE OF WOUND N/A 10/15/2020   Procedure: IRRIGATION AND DEBRIDEMENT WOUND;  Surgeon: Wallace Going, DO;  Location: [facility];  Service: Plastics;  Laterality: N/A;  60 min   JOINT REPLACEMENT     Left knee   KNEE ARTHROPLASTY Right 07/30/2017   Procedure: RIGHT TOTAL KNEE ARTHROPLASTY WITH COMPUTER NAVIGATION;  Surgeon: Rod Can, MD;  Location: [facility];  Service: Orthopedics;  Laterality: Right;  Needs RNFA   LAPAROSCOPIC GASTRIC BANDING     and reversal   LAPAROSCOPIC GASTRIC SLEEVE RESECTION N/A 08/26/2016   Procedure: LAPAROSCOPIC GASTRIC SLEEVE RESECTION WITH UPPER ENOD;  Surgeon: Johnathan Hausen, MD;  Location: [facility];  Service: General;  Laterality: N/A;   PANNICULECTOMY  N/A 09/26/2020   Procedure: PANNICULECTOMY;  Surgeon: Wallace Going, DO;  Location: [facility];  Service: Plastics;  Laterality: N/A;   TONSILLECTOMY     TRANSTHORACIC ECHOCARDIOGRAM  11/2013   EF 65-70%. Normal diastolic Fxn.  Normal Valves.   UMBILICAL HERNIA REPAIR N/A 09/26/2020   Procedure: HERNIA REPAIR UMBILICAL ADULT;  Surgeon: Georganna Skeans, MD;  Location: [facility];  Service: General;  Laterality: N/A;     No outpatient medications have been marked as taking for the 01/16/21 encounter (Video Visit) with Bary Castilla, NP.     Allergies:    Meloxicam and Other   Social History   Tobacco Use   Smoking status: Former    Packs/day: 0.25    Years: 10.00    Pack years: 2.50    Types: Cigarettes   Smokeless tobacco: Never   Tobacco comments:    quit 20 years ago  Vaping Use   Vaping Use: Never used  Substance Use Topics   Alcohol use: Not Currently    Alcohol/week: 1.0 standard drink    Types: 1 Glasses of wine per week    Comment: occasional   Drug use: No     Family Hx: The patient's family history includes Heart attack in her mother; Heart failure in her mother; Stroke in her father.  ROS:   Please see the history of present illness.    Review of Systems  Constitutional:  Negative for chills.  HENT:  Positive for congestion. Negative for sinus pain and sore throat.   Respiratory:  Negative for cough, shortness of breath and wheezing.   Cardiovascular:  Negative for chest pain.  Gastrointestinal:  Negative for diarrhea and nausea.  Neurological:  Negative for headaches.   All other systems reviewed and are negative.   Labs/Other Tests and Data Reviewed:    Recent Labs: 10/12/2020: TSH 4.574 11/03/2020: ALT 9 11/06/2020: Hemoglobin 7.6; Platelets 211 12/25/2020: BUN 14; Creatinine, Ser 1.23; Potassium 3.9; Sodium 144   Recent Lipid Panel Lab Results  Component Value Date/Time   CHOL 150 02/09/2020 02:43 PM   TRIG 55 02/09/2020 02:43 PM   HDL 61 02/09/2020 02:43 PM   CHOLHDL 2.5 02/09/2020 02:43 PM   LDLCALC 77 02/09/2020 02:43 PM    Wt Readings from Last 3 Encounters:  01/16/21 181 lb (82.1 kg)  12/25/20 181 lb 3.2 oz (82.2 kg)  11/22/20 200 lb 3.2 oz (90.8 kg)     Exam:    Vital Signs:  BP (!) 154/80   Pulse 81   Temp 98.8 F (37.1 C) (Oral)   Ht 5\' 5"  (1.651 m)   Wt 181 lb (82.1 kg)   BMI 30.12 kg/m     Physical Exam Vitals and nursing note reviewed.  Pulmonary:     Effort: Pulmonary effort is normal.  Neurological:     Mental Status: She is alert and oriented to person, place, and  time.  Psychiatric:        Mood and Affect: Affect normal.    ASSESSMENT & PLAN:    1. URI, acute -Slight fever yesterday. No fever today  -Slight congestion  -No fever, cough, SOB, chest pain, HA  -COVID neg at home; was not  -Advised patient to continue to stay hydrated with water; take tylenol as needed. OTC Mucinex as needed.  -Advised patient to monitor her symptoms and if her symptoms get worse to go the urgent care.   2. Congestion of nasal sinus  -Advised patient to continue to  stay hydrated with water; take tylenol as needed. OTC Mucinex as needed.  -Advised patient to monitor her symptoms and if her symptoms get worse to go the urgent care.   Follow up: if symptoms persist or do not get better.   The patient was encouraged to call or send a message through [place] for any questions or concerns.   "I discussed the limitations of evaluation and management by telemedicine and the availability of in person appointments. The patient expressed understanding and agreed to proceed"    Side effects and appropriate use of all the medication(s) were discussed with the patient today. Patient advised to use the medication(s) as directed by their healthcare provider. The patient was encouraged to read, review, and understand all associated package inserts and contact our office with any questions or concerns. The patient accepts the risks of the treatment plan and had an opportunity to ask questions.   Advised patient to take Vitamin C, D, Zinc.  Keep yourself hydrated with a lot of water and rest. Take Delsym for cough and Mucinex as need. Take Tylenol or pain reliever every 4-6 hours as needed for pain/fever/body ache. If you have elevated blood pressure, you can take OTC Corcidin. You can also take OTC oscillococcinum to help with your symptoms.  Educated patient if symptoms get worse or if she experiences any SOB, chest pain or pain in her legs to seek immediate emergency care. Continue to  monitor your oxygen levels. Call us if you have any questions.  Patient was given opportunity to ask questions. Patient verbalized understanding of the plan and was able to repeat key elements of the plan. All questions were answered to their satisfaction.  Raman Novah Nessel, DNP   I, Raman Jenee Spaugh have reviewed all documentation for this visit. The documentation on 01/16/21 for the exam, diagnosis, procedures, and orders are all accurate and complete.    COVID-19 Education: The signs and symptoms of COVID-19 were discussed with the patient and how to seek care for testing (follow up with PCP or arrange E-visit).  The importance of social distancing was discussed today.  Patient Risk:   After full review of this patients clinical status, I feel that they are at least moderate risk at this time.  Time:   Today, I have spent 10 minutes/ seconds with the patient with telehealth technology discussing above diagnoses.     Medication Adjustments/Labs and Tests Ordered: Current medicines are reviewed at length with the patient today.  Concerns regarding medicines are outlined above.   Tests Ordered: No orders of the defined types were placed in this encounter.   Medication Changes: No orders of the defined types were placed in this encounter.   Disposition:  Follow up prn  Signed, Bary Castilla, NP

## 2021-01-16 NOTE — Patient Instructions (Signed)

## 2021-01-16 NOTE — Chronic Care Management (AMB) (Signed)
Chronic Care Management Pharmacy Assistant   Name: Teresa Golden  MRN: [id number] DOB: [birth year]  Reason for Encounter: Disease State/ Diabetes  Recent office visits:  12-25-2020 Daneen Schick (CCM)  12-25-2020 Glendale Chard, MD. Glucose= 165, Creatinine= 1.23, eGFR= 46, BUN/Creatinine= 11, Chloride= 107. A1C= 6.3.  12-31-2020 Little, Claudette Stapler, RN (CCM)  01-02-2021 Daneen Schick (CCM)  01-10-2021 Daneen Schick (CCM)  Recent consult visits:  12-14-2020 Reita Chard (Plastic surgery). Dressing change. Follow up in 2-3 weeks.  12-20-2020 Barnie Del, LCSW (social worker). Unable to view encounter.  12-28-2020 Scheeler, Carola Rhine, PA-C (Plastic surgery). Dressing change. Follow up in 2-3 weeks.  01-08-2021 Corena Herter, PA-C (Plastic surgery). Dressing change. Follow up in 2-3 weeks.  Hospital visits:  Medication Reconciliation was completed by comparing discharge summary, patient's EMR and Pharmacy list, and upon discussion with patient.  Admitted to the hospital on 11-02-2020 due to wound infection after surgery. Discharge date was 11-07-2020. Discharged from [facility]?Medications Started at [facility] Discharge:?? Colchine 0.6 mg daily for 7 days Tresiba inject 5 units at bedtime Oxycodone 5-325 mg every 6 hours as needed Saccharmonyces boulardii 250 mg twice daily for 5 days  Medication Changes at Hospital Discharge: None  Medications Discontinued at Hospital Discharge: Losartan 100 mg daily Zofran 4 mg as needed Ozempic 1 mg weekly Solostar  Medications that remain the same after Hospital Discharge:??  -All other medications will remain the same.    Medications: Outpatient Encounter Medications as of 01/16/2021  Medication Sig   acetaminophen (TYLENOL) 325 MG tablet Take 2 tablets (650 mg total) by mouth every 6 (six) hours as needed for moderate pain or mild pain.   amLODipine (NORVASC) 5 MG tablet Take 1 tablet (5 mg  total) by mouth in the morning.   B Complex-C (B-COMPLEX WITH VITAMIN C) tablet Take 1 tablet by mouth daily.   Biotin 10000 MCG TABS Take 10,000 mcg by mouth daily.   Cholecalciferol 5000 units TABS Take 5,000 Units by mouth daily.   colchicine 0.6 MG tablet Take 1 tablet (0.6 mg total) by mouth daily for 7 days.   Cranberry-Vitamin C 250-60 MG CAPS Take 1 capsule by mouth daily.   diclofenac Sodium (VOLTAREN) 1 % GEL APPLY 2 GRAMS TO AFFECTED AREA(S) 4 TIMES DAILY AS NEEDED (Patient taking differently: Apply 2-4 g topically every 6 (six) hours as needed (to affected areas).)   gabapentin (NEURONTIN) 300 MG capsule Take 600 mg by mouth at bedtime.   glucose blood (ONETOUCH VERIO) test strip Use as instructed to check blood sugars 2 times per day dx: e11.65   hydrochlorothiazide (HYDRODIURIL) 25 MG tablet Take 1 tablet (25 mg total) by mouth daily.   metoprolol succinate (TOPROL-XL) 25 MG 24 hr tablet Take 1 tablet by mouth daily at bedtime   Multiple Vitamins-Minerals (MULTIVITAMIN WITH MINERALS) tablet Take 1 tablet by mouth daily. Women's 28+   OneTouch Delica Lancets 76O MISC Use as instructed to check blood sugars 2 times per day dx: e11.65   polyethylene glycol (MIRALAX / GLYCOLAX) 17 g packet Take 17 g by mouth daily.   rivaroxaban (XARELTO) 20 MG TABS tablet Take 1 tablet (20 mg total) by mouth daily with supper.   Semaglutide, 1 MG/DOSE, (OZEMPIC, 1 MG/DOSE,) 4 MG/3ML SOPN Inject into the skin.   simvastatin (ZOCOR) 10 MG tablet Take 1 tablet (10 mg total) by mouth daily. (Patient taking differently: Take 10 mg by mouth at bedtime.)  SYNTHROID 112 MCG tablet Take 112 mcg by mouth daily before breakfast.   [DISCONTINUED] SEMGLEE, YFGN, 100 UNIT/ML Pen Inject 9 Units into the skin at bedtime.   No facility-administered encounter medications on file as of 01/16/2021.  Recent Relevant Labs: Lab Results  Component Value Date/Time   HGBA1C 6.3 (H) 12/25/2020 12:26 PM   HGBA1C 6.4 (H)  09/18/2020 08:56 AM   MICROALBUR 10 02/09/2020 10:59 AM   MICROALBUR 10 01/17/2019 06:09 PM    Kidney Function Lab Results  Component Value Date/Time   CREATININE 1.23 (H) 12/25/2020 12:26 PM   CREATININE 0.89 11/06/2020 03:44 AM   GFRNONAA >60 11/06/2020 03:44 AM   GFRAA 67 11/29/2019 02:11 PM    Current antihyperglycemic regimen:  Ozempic 1 mg weekly  What recent interventions/DTPs have been made to improve glycemic control:  Educated on A1c and blood sugar goals Complications of diabetes including kidney damage, retinal damage, and cardiovascular disease Benefits of weight loss  Have there been any recent hospitalizations or ED visits since last visit with CPP? No  Patient denies hypoglycemic symptoms  Patient denies hyperglycemic symptoms  How often are you checking your blood sugar? once daily  What are your blood sugars ranging?  Fasting: 115 Before meals: None After meals: None Bedtime: None  During the week, how often does your blood glucose drop below 70? Never  Are you checking your feet daily/regularly? Daily  Adherence Review: Is the patient currently on a STATIN medication? No Is the patient currently on ACE/ARB medication? No Does the patient have >5 day gap between last estimated fill dates? Yes  NOTES: Patient states she is out of Ozempic. Called [facility] and was informed that Ozempic was on auto refill but for some reason it didn't go through. Ozempic was processed and will be shipped 10-14 days. Patient was given a free 30 day voucher to be sent to [facility]. Sent Tianna CMA a message to send a refill to [facility]. Contacted [facility] with voucher information. Patient informed.  Care Gaps: Covid booster overdue Yearly ophthalmology exam overude AWV 10-10-2021  Star Rating Drugs: Ozempic 1 mg- Patient assistance Simvastatin 10 mg- Last filled 08-31-2020 [address] [place] Clinical Pharmacist Assistant [phone]

## 2021-01-21 ENCOUNTER — Ambulatory Visit (INDEPENDENT_AMBULATORY_CARE_PROVIDER_SITE_OTHER): Payer: Medicare HMO | Admitting: Physician Assistant

## 2021-01-21 ENCOUNTER — Other Ambulatory Visit: Payer: Self-pay

## 2021-01-21 DIAGNOSIS — Z9889 Other specified postprocedural states: Secondary | ICD-10-CM

## 2021-01-21 NOTE — Progress Notes (Signed)
Referring Provider Glendale Chard, MD [address] [place],  [place] [address]   CC:  Chief Complaint  Patient presents with   Skin Problem      Teresa Golden is an 75 y.o. female.  HPI:  Patient is a 75 year old female with PMH of type II DM, HTN, CKD, and recent hospitalizations x2 for wound infection secondary to panniculectomy performed 09/26/2020 by Dr. Marla Roe.  She was last seen here in the office on 01/08/2021.  At that time, she reports that she was continue with silver alginate dressings at home via [facility] and that she had not missed any dressing changes.  She continues to endorse multiple areas of hypergranulation and inquired about silver nitrate cauterization.  She was without any systemic symptoms.  Physical exam showed small wound with drainage over right lateral aspect of incision as well as 1 immediately left of the midline.  No cellulitic findings were noted.  Chemical cauterization was again performed to the areas of hypergranulation.  Recommending continued silver alginate dressings 3 times per week per HHN.    Today, she is doing well.  She inquires about when she can return to water aerobics.  She states that she is continuing to have regular dressing changes and denies any recent complications.  She also denies any recent fevers, chills, or other symptoms.  Patient continues to endorse drainage from small wounds.   Allergies  Allergen Reactions   Meloxicam Other (See Comments)    Fever; muscle aches; "flu-like" symptoms "Allergic," per [facility]   Other Other (See Comments)    "Rose fever and hay fever"    Outpatient Encounter Medications as of 01/21/2021  Medication Sig   acetaminophen (TYLENOL) 325 MG tablet Take 2 tablets (650 mg total) by mouth every 6 (six) hours as needed for moderate pain or mild pain.   amLODipine (NORVASC) 5 MG tablet Take 1 tablet (5 mg total) by mouth in the morning.   B Complex-C (B-COMPLEX WITH VITAMIN C) tablet Take 1 tablet  by mouth daily.   Biotin 10000 MCG TABS Take 10,000 mcg by mouth daily.   Cholecalciferol 5000 units TABS Take 5,000 Units by mouth daily.   colchicine 0.6 MG tablet Take 1 tablet (0.6 mg total) by mouth daily for 7 days.   Cranberry-Vitamin C 250-60 MG CAPS Take 1 capsule by mouth daily.   diclofenac Sodium (VOLTAREN) 1 % GEL APPLY 2 GRAMS TO AFFECTED AREA(S) 4 TIMES DAILY AS NEEDED (Patient taking differently: Apply 2-4 g topically every 6 (six) hours as needed (to affected areas).)   gabapentin (NEURONTIN) 300 MG capsule Take 600 mg by mouth at bedtime.   glucose blood (ONETOUCH VERIO) test strip Use as instructed to check blood sugars 2 times per day dx: e11.65   hydrochlorothiazide (HYDRODIURIL) 25 MG tablet Take 1 tablet (25 mg total) by mouth daily.   metoprolol succinate (TOPROL-XL) 25 MG 24 hr tablet Take 1 tablet by mouth daily at bedtime   Multiple Vitamins-Minerals (MULTIVITAMIN WITH MINERALS) tablet Take 1 tablet by mouth daily. Women's 63+   OneTouch Delica Lancets 33L MISC Use as instructed to check blood sugars 2 times per day dx: e11.65   polyethylene glycol (MIRALAX / GLYCOLAX) 17 g packet Take 17 g by mouth daily.   rivaroxaban (XARELTO) 20 MG TABS tablet Take 1 tablet (20 mg total) by mouth daily with supper.   Semaglutide, 1 MG/DOSE, (OZEMPIC, 1 MG/DOSE,) 4 MG/3ML SOPN Inject into the skin.   simvastatin (ZOCOR)  10 MG tablet Take 1 tablet (10 mg total) by mouth daily. (Patient taking differently: Take 10 mg by mouth at bedtime.)   SYNTHROID 112 MCG tablet Take 112 mcg by mouth daily before breakfast.   [DISCONTINUED] SEMGLEE, YFGN, 100 UNIT/ML Pen Inject 9 Units into the skin at bedtime.   No facility-administered encounter medications on file as of 01/21/2021.     Past Medical History:  Diagnosis Date   Arthritis    Chronic kidney disease    self reports ckd stage 3    Depression    Diabetes mellitus, type II, insulin dependent ([place])    With neurologic  complications. 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    Hypertensive nephropathy 07/05/2018   Hypothyroidism    Hypothyroidism 02/06/2018   Morbid obesity with BMI of 50.0-59.9, adult ([place])    Scoliosis     Past Surgical History:  Procedure Laterality Date   ABDOMINAL HYSTERECTOMY     uterus   CHOLECYSTECTOMY     DIAGNOSTIC LAPAROSCOPY     DILATION AND CURETTAGE OF UTERUS     EYE SURGERY     cataract extraction bilateral    INCISION AND DRAINAGE OF WOUND N/A 10/15/2020   Procedure: IRRIGATION AND DEBRIDEMENT WOUND;  Surgeon: Wallace Going, DO;  Location: [facility];  Service: Plastics;  Laterality: N/A;  60 min   JOINT REPLACEMENT     Left knee   KNEE ARTHROPLASTY Right 07/30/2017   Procedure: RIGHT TOTAL KNEE ARTHROPLASTY WITH COMPUTER NAVIGATION;  Surgeon: Rod Can, MD;  Location: [facility];  Service: Orthopedics;  Laterality: Right;  Needs RNFA   LAPAROSCOPIC GASTRIC BANDING     and reversal   LAPAROSCOPIC GASTRIC SLEEVE RESECTION N/A 08/26/2016   Procedure: LAPAROSCOPIC GASTRIC SLEEVE RESECTION WITH UPPER ENOD;  Surgeon: Johnathan Hausen, MD;  Location: [facility];  Service: General;  Laterality: N/A;   PANNICULECTOMY N/A 09/26/2020   Procedure: PANNICULECTOMY;  Surgeon: Wallace Going, DO;  Location: [facility];  Service: Plastics;  Laterality: N/A;   TONSILLECTOMY     TRANSTHORACIC ECHOCARDIOGRAM  11/2013   EF 65-70%. Normal diastolic Fxn.  Normal Valves.   UMBILICAL HERNIA REPAIR N/A 09/26/2020   Procedure: HERNIA REPAIR UMBILICAL ADULT;  Surgeon: Georganna Skeans, MD;  Location: [facility];  Service: General;  Laterality: N/A;    Family History  Problem Relation Age of Onset   Heart attack Mother    Heart failure Mother    Stroke  Father     Social History   Social History Narrative   Not on file     Review of Systems General: Denies fevers or chills GI: Denies abdominal pain. Skin: Endorses drainage.  Denies purulence or redness.  Physical Exam Vitals with BMI 01/16/2021 12/25/2020 11/22/2020  Height 5\' 5"  5\' 5"  5\' 5"   Weight 181 lbs 181 lbs 3 oz 200 lbs 3 oz  BMI 30.12 66.06 30.16  Systolic 010 932 355  Diastolic 80 68 68  Pulse 81 87 84    General:  No acute distress, nontoxic appearing  Respiratory: No increased work of breathing Neuro: Alert and oriented Psychiatric: Normal mood and affect  Skin: 3 x 1 cm incisional wound immediately left of midline, good granular  tissue noted.  Mild nonpurulent drainage.  Additional small, approximately 1 cm incisional wound over lateral aspect incision, right side.  No surrounding erythema or induration.  No significant malodor.  No cellulitic findings.  Small area of hypergranular tissue over incision, immediately right of the midline, that was chemically cauterized without complication and well-tolerated.  Assessment/Plan  Panniculectomy site continues to improve each visit.  She is entirely healed lateral to a 3 cm long incisional wound medially left of the midline.  She only has a single additional wound, approximately one third the size, over right lateral aspect of incision.  Applied Vaseline and gauze to the wounds.  The larger wound was dressed with Xeroform, smaller wound dressed with Vaseline and gauze.  These were then covered with ABD pads and secured with Medipore tape.  She understands that she cannot submerge her incision site into water for at least 3 months from when she is completely healed, with no remaining wounds.  She understands that it will likely be next summer that she is able to participate in her water aerobics.  She denies any systemic symptoms and there are no cellulitic findings on exam.    Return in 3 weeks.  Continue with silver alginate  dressings in interim over persistent wounds 3 times per week.   Krista Blue 01/21/2021, 8:55 AM

## 2021-01-22 ENCOUNTER — Other Ambulatory Visit: Payer: Self-pay

## 2021-01-22 MED ORDER — OZEMPIC (1 MG/DOSE) 4 MG/3ML ~~LOC~~ SOPN
1.0000 mg | PEN_INJECTOR | SUBCUTANEOUS | 1 refills | Status: DC
  Filled 2021-01-25 (×2): qty 3, 28d supply, fill #0
  Filled 2021-02-27: qty 3, 28d supply, fill #1

## 2021-01-23 ENCOUNTER — Other Ambulatory Visit: Payer: Self-pay | Admitting: Internal Medicine

## 2021-01-23 DIAGNOSIS — N182 Chronic kidney disease, stage 2 (mild): Secondary | ICD-10-CM | POA: Diagnosis not present

## 2021-01-23 DIAGNOSIS — Z794 Long term (current) use of insulin: Secondary | ICD-10-CM

## 2021-01-23 DIAGNOSIS — N183 Chronic kidney disease, stage 3 unspecified: Secondary | ICD-10-CM

## 2021-01-23 DIAGNOSIS — E1122 Type 2 diabetes mellitus with diabetic chronic kidney disease: Secondary | ICD-10-CM | POA: Diagnosis not present

## 2021-01-23 DIAGNOSIS — I129 Hypertensive chronic kidney disease with stage 1 through stage 4 chronic kidney disease, or unspecified chronic kidney disease: Secondary | ICD-10-CM

## 2021-01-24 ENCOUNTER — Other Ambulatory Visit: Payer: Self-pay | Admitting: Internal Medicine

## 2021-01-25 ENCOUNTER — Other Ambulatory Visit (HOSPITAL_BASED_OUTPATIENT_CLINIC_OR_DEPARTMENT_OTHER): Payer: Self-pay

## 2021-01-25 ENCOUNTER — Telehealth: Payer: Self-pay

## 2021-01-25 ENCOUNTER — Other Ambulatory Visit (HOSPITAL_COMMUNITY): Payer: Self-pay

## 2021-01-25 MED ORDER — OZEMPIC (1 MG/DOSE) 4 MG/3ML ~~LOC~~ SOPN
1.0000 mg | PEN_INJECTOR | SUBCUTANEOUS | 1 refills | Status: DC
  Filled 2021-01-25: qty 3, 28d supply, fill #0

## 2021-01-25 NOTE — Chronic Care Management (AMB) (Signed)
    Chronic Care Management Pharmacy Assistant   Name: Teresa Golden  MRN: [id number] DOB: [birth year]  Reason for Encounter: Ozempic   Medications: Outpatient Encounter Medications as of 01/25/2021  Medication Sig   acetaminophen (TYLENOL) 325 MG tablet Take 2 tablets (650 mg total) by mouth every 6 (six) hours as needed for moderate pain or mild pain.   amLODipine (NORVASC) 5 MG tablet Take 1 tablet by mouth once daily   B Complex-C (B-COMPLEX WITH VITAMIN C) tablet Take 1 tablet by mouth daily.   Biotin 10000 MCG TABS Take 10,000 mcg by mouth daily.   Cholecalciferol 5000 units TABS Take 5,000 Units by mouth daily.   colchicine 0.6 MG tablet Take 1 tablet (0.6 mg total) by mouth daily for 7 days.   Cranberry-Vitamin C 250-60 MG CAPS Take 1 capsule by mouth daily.   diclofenac Sodium (VOLTAREN) 1 % GEL APPLY 2 GRAMS TO AFFECTED AREA(S) 4 TIMES DAILY AS NEEDED (Patient taking differently: Apply 2-4 g topically every 6 (six) hours as needed (to affected areas).)   gabapentin (NEURONTIN) 300 MG capsule Take 600 mg by mouth at bedtime.   glucose blood (ONETOUCH VERIO) test strip USE TO CHECK BLOOD SUGAR   TWO TIMES A DAY AS         INSTRUCTED   hydrochlorothiazide (HYDRODIURIL) 25 MG tablet Take 1 tablet (25 mg total) by mouth daily.   Lancets (ONETOUCH DELICA PLUS [id number]) MISC USE TO CHECK BLOOD SUGARS  TWO TIMES A DAY AS         INSTRUCTED   metoprolol succinate (TOPROL-XL) 25 MG 24 hr tablet TAKE 1 TABLET AT BEDTIME   (DISCONTINUE COREG)   Multiple Vitamins-Minerals (MULTIVITAMIN WITH MINERALS) tablet Take 1 tablet by mouth daily. Women's 50+   polyethylene glycol (MIRALAX / GLYCOLAX) 17 g packet Take 17 g by mouth daily.   rivaroxaban (XARELTO) 20 MG TABS tablet Take 1 tablet (20 mg total) by mouth daily with supper.   Semaglutide, 1 MG/DOSE, (OZEMPIC, 1 MG/DOSE,) 4 MG/3ML SOPN Inject 1 mg into the skin once a week.   simvastatin (ZOCOR) 10 MG tablet Take 1 tablet (10 mg total) by mouth  daily. (Patient taking differently: Take 10 mg by mouth at bedtime.)   SYNTHROID 112 MCG tablet Take 112 mcg by mouth daily before breakfast.   [DISCONTINUED] SEMGLEE, YFGN, 100 UNIT/ML Pen Inject 9 Units into the skin at bedtime.   No facility-administered encounter medications on file as of 01/25/2021.    01-25-2021: Patient called stating that her Ozempic 1 mg that was supposed to be filled on 01-16-2021 at [facility] was out of stock. [facility] to clarify because I spoke with 2 pharmacists on 01-16-2021 and was told medication was in stock and will be filled the same day. Was informed today that medication was probably in stock but their system automatically generates the next person's refill that's on the wait list and they're unable to view that. Was informed to call around to different pharmacies so script can be transferred. [facility] and medication is on back order for all. Contacted [facility] and medication is in stock. [facility] is transferring script to [place] with voucher. Updated patient and will call [place] [facility] in a few to follow up. Ozempic was filled and ready for pick up. Informed patient.  [place] Pharmacist Assistant [phone]

## 2021-01-28 ENCOUNTER — Other Ambulatory Visit (HOSPITAL_COMMUNITY): Payer: Self-pay

## 2021-01-29 ENCOUNTER — Ambulatory Visit (INDEPENDENT_AMBULATORY_CARE_PROVIDER_SITE_OTHER): Payer: Medicare HMO | Admitting: Addiction (Substance Use Disorder)

## 2021-01-29 ENCOUNTER — Other Ambulatory Visit: Payer: Self-pay

## 2021-01-29 DIAGNOSIS — F4323 Adjustment disorder with mixed anxiety and depressed mood: Secondary | ICD-10-CM | POA: Diagnosis not present

## 2021-01-29 NOTE — Progress Notes (Signed)
      [facility] Counselor/Therapist Progress Note  [name], MRN: [id number],    Date: 01/29/2021   Time Spent:  53 mins  Treatment Type: Individual Therapy  Reported Symptoms: ready to get better, some low moods.   Mental Status Exam:  Appearance:   Casual and Well Groomed     Behavior:  Appropriate and Sharing  Motor:  Normal  Speech/Language:   Clear and Coherent and Normal Rate  Affect:  Appropriate  Mood:  normal  Thought process:  normal  Thought content:    Rumination  Sensory/Perceptual disturbances:    WNL  Orientation:  x4  Attention:  Good  Concentration:  Good  Memory:  WNL  Fund of knowledge:   Good  Insight:    Good  Judgment:   Good  Impulse Control:  Fair   Risk Assessment: Danger to Self:  No Self-injurious Behavior: No Danger to Others: No Duty to Warn:no Physical Aggression / Violence:No  Access to Firearms a concern: No  Gang Involvement:No    Subjective: Client reported about the day she felt out of her mind and canceled all her appts here. Client reported feeling mood cycling and low moods/motivations. Client expressed her lack of good news and stability over the first few months following her surgery. Client processed the complications of her medical recovery and how discouraging it was for her; therapist used MI & CBT with client to validate her feelings and help her process her thoughts about how everything went for her. Client dealing with some loneliness following this recovery and has just returned to church 2 days ago. Client also making progress still working towards finding a part time job through [employer]. Therapist assessed for stability and client denied SI/HI/AVH.   Interventions: Cognitive Behavioral Therapy, Motivational Interviewing, Grief Therapy, and RPT    Diagnosis:   ICD-10-CM   1. Adjustment disorder with mixed anxiety and depressed mood  F43.23       Plan of Care:  Client is to return to  therapy with therapist every 1-2 weeks as needed to process traumas/ frustrations in a safe space, to be re-evaluated in 3 months.  Client is to practice mindfulness AEB daily meditation and body scans or as needed when flooded by emotion/pain.  Client is to learn/practice DBT wise mind & radical acceptance. Client is to practice self-compassion AEB being gentle with themselves, utilizing self-care techniques daily or as needed when grieving something in the moment.  Client is to process grief/pain of their loss in a somatic body-felt sense way: ie using mindfulness, brainspotting, or trauma release as a method for releasing body pain/tension caused by grief.  Barnie Del, LCSW, LCAS, CCTP, CCS-I, BSP

## 2021-01-30 ENCOUNTER — Ambulatory Visit: Payer: Medicare HMO

## 2021-01-30 ENCOUNTER — Telehealth: Payer: Self-pay

## 2021-01-30 DIAGNOSIS — I129 Hypertensive chronic kidney disease with stage 1 through stage 4 chronic kidney disease, or unspecified chronic kidney disease: Secondary | ICD-10-CM

## 2021-01-30 DIAGNOSIS — Z794 Long term (current) use of insulin: Secondary | ICD-10-CM

## 2021-01-30 DIAGNOSIS — E1122 Type 2 diabetes mellitus with diabetic chronic kidney disease: Secondary | ICD-10-CM

## 2021-01-30 DIAGNOSIS — N183 Chronic kidney disease, stage 3 unspecified: Secondary | ICD-10-CM

## 2021-01-30 NOTE — Chronic Care Management (AMB) (Signed)
  Teresa Golden was reminded to have all medications, supplements and any blood glucose and blood pressure readings available for review with [name], Pharm. D, at her telephone visit on 01-31-2021 at 2:00.   Questions: Have you had any recent office visit or specialist visit outside of [place]? Patient stated no  Are there any concerns you would like to discuss during your office visit? Patient stated she wants to stop taking Xarelto.  Are you having any problems obtaining your medications? (Whether it pharmacy issues or cost) Patient stated no  If patient has any PAP medications ask if they are having any problems getting their PAP medication or refill? Patient stated no.  Care Gaps: Covid booster overdue Yearly ophthalmology exam overdue  Star Rating Drug: Ozempic 1 mg- Patient assistance Simvastatin 10 mg- Last filled 01-23-2021 [facility]  Any gaps in medications fill history? No  [place] Pharmacist Assistant [phone]

## 2021-01-30 NOTE — Chronic Care Management (AMB) (Signed)
Chronic Care Management    Social Work Discipline Closure Note  01/30/2021 Name: Teresa Golden MRN: [id number] DOB: [birth year]  Teresa Golden is a 75 y.o. year old female who is a primary care patient of Glendale Chard, MD. The CCM team was consulted to assist the patient with chronic disease management and/or care coordination needs related to:  DM II, CKD III, HTN .   Engaged with patient by telephone for follow up visit in response to provider referral for social work chronic care management and care coordination services.   Consent to Services:  The patient was given information about Chronic Care Management services, agreed to services, and gave verbal consent prior to initiation of services.  Please see initial visit note for detailed documentation.   Patient agreed to services and consent obtained.   Assessment: Review of patient past medical history, allergies, medications, and health status, including review of relevant consultants reports was performed today as part of a comprehensive evaluation and provision of chronic care management and care coordination services.     SDOH (Social Determinants of Health) assessments and interventions performed:    Advanced Directives Status: Not addressed in this encounter.  CCM Care Plan  Allergies  Allergen Reactions   Meloxicam Other (See Comments)    Fever; muscle aches; "flu-like" symptoms "Allergic," per [facility]   Other Other (See Comments)    "Rose fever and hay fever"    Outpatient Encounter Medications as of 01/30/2021  Medication Sig   acetaminophen (TYLENOL) 325 MG tablet Take 2 tablets (650 mg total) by mouth every 6 (six) hours as needed for moderate pain or mild pain.   amLODipine (NORVASC) 5 MG tablet Take 1 tablet by mouth once daily   B Complex-C (B-COMPLEX WITH VITAMIN C) tablet Take 1 tablet by mouth daily.   Biotin 10000 MCG TABS Take 10,000 mcg by mouth daily.   Cholecalciferol 5000 units TABS Take 5,000 Units by  mouth daily.   colchicine 0.6 MG tablet Take 1 tablet (0.6 mg total) by mouth daily for 7 days.   Cranberry-Vitamin C 250-60 MG CAPS Take 1 capsule by mouth daily.   diclofenac Sodium (VOLTAREN) 1 % GEL APPLY 2 GRAMS TO AFFECTED AREA(S) 4 TIMES DAILY AS NEEDED (Patient taking differently: Apply 2-4 g topically every 6 (six) hours as needed (to affected areas).)   gabapentin (NEURONTIN) 300 MG capsule Take 600 mg by mouth at bedtime.   glucose blood (ONETOUCH VERIO) test strip USE TO CHECK BLOOD SUGAR   TWO TIMES A DAY AS         INSTRUCTED   hydrochlorothiazide (HYDRODIURIL) 25 MG tablet Take 1 tablet (25 mg total) by mouth daily.   Lancets (ONETOUCH DELICA PLUS [id number]) MISC USE TO CHECK BLOOD SUGARS  TWO TIMES A DAY AS         INSTRUCTED   metoprolol succinate (TOPROL-XL) 25 MG 24 hr tablet TAKE 1 TABLET AT BEDTIME   (DISCONTINUE COREG)   Multiple Vitamins-Minerals (MULTIVITAMIN WITH MINERALS) tablet Take 1 tablet by mouth daily. Women's 50+   polyethylene glycol (MIRALAX / GLYCOLAX) 17 g packet Take 17 g by mouth daily.   rivaroxaban (XARELTO) 20 MG TABS tablet Take 1 tablet (20 mg total) by mouth daily with supper.   Semaglutide, 1 MG/DOSE, (OZEMPIC, 1 MG/DOSE,) 4 MG/3ML SOPN Inject 1 mg into the skin once a week.   Semaglutide, 1 MG/DOSE, (OZEMPIC, 1 MG/DOSE,) 4 MG/3ML SOPN Inject 1 mg into the skin once a week.  simvastatin (ZOCOR) 10 MG tablet Take 1 tablet (10 mg total) by mouth daily. (Patient taking differently: Take 10 mg by mouth at bedtime.)   SYNTHROID 112 MCG tablet Take 112 mcg by mouth daily before breakfast.   [DISCONTINUED] SEMGLEE, YFGN, 100 UNIT/ML Pen Inject 9 Units into the skin at bedtime.   No facility-administered encounter medications on file as of 01/30/2021.    Patient Active Problem List   Diagnosis Date Noted   Infected wound 11/02/2020   Pressure injury of skin 10/22/2020   Wound infection after surgery 10/12/2020   Normocytic anemia 10/12/2020   AKI  (acute kidney injury) ([place]) 10/12/2020   Obesity (BMI 30-39.9) 10/12/2020   Sepsis ([place]) 10/12/2020   Type 2 diabetes mellitus with stage 3 chronic kidney disease, with long-term current use of insulin ([place]) 11/29/2019   Panniculitis 09/30/2019   Back pain 09/30/2019   Hepatoma ([place]) 01/21/2019   Other cirrhosis of liver ([place]) 07/05/2018   Hypertensive nephropathy 07/05/2018   Chronic renal disease, stage III ([place]) 07/05/2018   Chronic left shoulder pain 07/05/2018   Hypothyroidism 02/06/2018   Hepatitis C virus infection cured after antiviral drug therapy 02/04/2018   Osteoarthritis of right knee 07/30/2017   S/P laparoscopic sleeve gastrectomy July 2018 08/26/2016   Preop cardiovascular exam 07/30/2016   Dyspnea on exertion 07/30/2016   Bilateral lower extremity edema 07/30/2016    Conditions to be addressed/monitored: HTN, DMII, and CKD Stage III  Care Plan : Social Work Care Plan  Updates made by Daneen Schick since 01/30/2021 12:00 AM  Completed 01/30/2021   Problem: Mobility and Independence Resolved 01/30/2021     Goal: Mobility and Independence Optimized Completed 01/30/2021  Start Date: 11/09/2020  Expected End Date: 12/24/2020  Recent Progress: On track  Priority: High  Note:   Current Barriers:  Chronic disease management support and education needs related to HTN, DM, and CKD Stage III   Recent hospitalization due to wound infection following panniculectomy Transportation Social Worker Clinical Goal(s):  patient will work with SW to identify and address any acute and/or chronic care coordination needs related to the self health management of HTN, DM, and CKD Stage III   patient will work with SW to address concerns related to care coordination needs post hospitalization  SW Interventions:  Inter-disciplinary care team collaboration (see longitudinal plan of care) Collaboration with Glendale Chard, MD regarding development and update of comprehensive plan of care  as evidenced by provider attestation and co-signature Telephonic visit completed with the patient to assist with care coordination needs Discussed the patient did speak with pharmacy assistance Malecca Ishmael Holter who was able to secure a month supply of Ozempic from a local pharmacy who accepted the patients voucher for patient assistance - patient picked up medication and has it on hand Reviewed patient has an [place] appointment with her plastic surgeon's office on 12/20 - patient anticipates this may be her last visit based on current healing pattern from panniculectomy Discussed patient has been reintegrating herself into daily engagement activities but is unable to initiate water aerobics until the end of March Discussed patient would like to see an audiologist but is unsure who may be in network - provided patient with information to [facility] Advised patient to request a referral during her upcomming physical scheduled for 12/22  Assessed for SW needs- none identified at this time. Encouraged the patient to contact SW as needed  Patient Goals/Self-Care Activities patient will:   - Attend all [place] appointments -Speak  with primary care provider to obtain a referral for an audiologist -Engage with Pharmacy team to address medication related needs -Contact SW as needed        Follow Up Plan:  No SW follow up planned at this time. SW is available to assist as needed with future care coordination needs. Patient will remain engaged with RN Care Manager and PharmD to address care management needs.      Daneen Schick, BSW, CDP Social Worker, Certified Dementia Practitioner [place] [facility] [place] [facility] [phone]

## 2021-01-30 NOTE — Patient Instructions (Signed)
Social Worker Visit Information  Goals we discussed today:   Goals Addressed             This Visit's Progress    COMPLETED: Mobility and Independence Optimized       Timeframe:  Short-Term Goal Priority:  High Start Date:  9.16.22                           Expected End Date:    10.31.22                   Patient Goals/Self-Care Activities patient will:   - Attend all upcomming appointments -Speak with primary care provider to obtain a referral for an audiologist -Engage with Pharmacy team to address medication related needs -Contact SW as needed         Patient verbalizes understanding of instructions provided today and agrees to view in [place].   Follow Up Plan:  No SW follow up planned at this time. Please contact me as needed.  Daneen Schick, BSW, CDP Social Worker, Certified Dementia Practitioner [place] [facility] [place] [facility] [phone]

## 2021-01-31 ENCOUNTER — Telehealth: Payer: Medicare HMO

## 2021-01-31 ENCOUNTER — Other Ambulatory Visit: Payer: Self-pay | Admitting: Nurse Practitioner

## 2021-01-31 DIAGNOSIS — K7469 Other cirrhosis of liver: Secondary | ICD-10-CM

## 2021-02-08 ENCOUNTER — Ambulatory Visit: Payer: Medicare HMO | Admitting: Addiction (Substance Use Disorder)

## 2021-02-08 NOTE — Progress Notes (Signed)
Referring Provider Glendale Chard, MD [address] [place],  [place] [address]   CC:  Chief Complaint  Patient presents with   Follow-up   Skin Problem      Teresa Golden is an 75 y.o. female.  HPI: Patient is a 75 year old female with PMH of type II DM, HTN, CKD, and recent hospitalizations x2 for wound infection secondary to panniculectomy performed 09/26/2020 by Dr. Marla Roe.   Patient was last seen here in clinic on 01/21/2021.  At that time, she continue endorse regular dressing changes and small amounts of drainage from incisional wounds.  Patient physical exam is entirely reassuring.  There is a 3 x 1 cm incisional wound immediately left of the midline, good granular tissue noted.  Seemed improved compared to prior encounters.  No evidence of infection.  Plan was for her to return in 3 weeks and continue with silver alginate dressings changed 3 times weekly in interim.  Today, patient is doing wonderfully.  She endorses continued improvement.  She feels as though she has a single area of hypertrophy over left side of incision, but no significant drainage.  She inquires as to whether or not she is able to transition to spanks.  She cannot wait until she can once again participate in water aerobics.  She is hoping that she can by the end of Spring.  She denies any pain, fevers, or other recent complications.  She has been continuing with her dressing changes, as directed.   Allergies  Allergen Reactions   Meloxicam Other (See Comments)    Fever; muscle aches; "flu-like" symptoms "Allergic," per [facility]   Other Other (See Comments)    "Rose fever and hay fever"    Outpatient Encounter Medications as of 02/12/2021  Medication Sig   acetaminophen (TYLENOL) 325 MG tablet Take 2 tablets (650 mg total) by mouth every 6 (six) hours as needed for moderate pain or mild pain.   amLODipine (NORVASC) 5 MG tablet Take 1 tablet by mouth once daily   B Complex-C (B-COMPLEX WITH  VITAMIN C) tablet Take 1 tablet by mouth daily.   Biotin 10000 MCG TABS Take 10,000 mcg by mouth daily.   Cholecalciferol 5000 units TABS Take 5,000 Units by mouth daily.   Cranberry-Vitamin C 250-60 MG CAPS Take 1 capsule by mouth daily.   diclofenac Sodium (VOLTAREN) 1 % GEL APPLY 2 GRAMS TO AFFECTED AREA(S) 4 TIMES DAILY AS NEEDED (Patient taking differently: Apply 2-4 g topically every 6 (six) hours as needed (to affected areas).)   gabapentin (NEURONTIN) 300 MG capsule Take 600 mg by mouth at bedtime.   glucose blood (ONETOUCH VERIO) test strip USE TO CHECK BLOOD SUGAR   TWO TIMES A DAY AS         INSTRUCTED   hydrochlorothiazide (HYDRODIURIL) 25 MG tablet Take 1 tablet (25 mg total) by mouth daily.   Lancets (ONETOUCH DELICA PLUS [id number]) MISC USE TO CHECK BLOOD SUGARS  TWO TIMES A DAY AS         INSTRUCTED   metoprolol succinate (TOPROL-XL) 25 MG 24 hr tablet TAKE 1 TABLET AT BEDTIME   (DISCONTINUE COREG)   Multiple Vitamins-Minerals (MULTIVITAMIN WITH MINERALS) tablet Take 1 tablet by mouth daily. Women's 50+   polyethylene glycol (MIRALAX / GLYCOLAX) 17 g packet Take 17 g by mouth daily.   rivaroxaban (XARELTO) 20 MG TABS tablet Take 1 tablet (20 mg total) by mouth daily with supper.   Semaglutide, 1 MG/DOSE, (OZEMPIC, 1 MG/DOSE,)  4 MG/3ML SOPN Inject 1 mg into the skin once a week.   Semaglutide, 1 MG/DOSE, (OZEMPIC, 1 MG/DOSE,) 4 MG/3ML SOPN Inject 1 mg into the skin once a week.   simvastatin (ZOCOR) 10 MG tablet Take 1 tablet (10 mg total) by mouth daily. (Patient taking differently: Take 10 mg by mouth at bedtime.)   SYNTHROID 112 MCG tablet Take 112 mcg by mouth daily before breakfast.   colchicine 0.6 MG tablet Take 1 tablet (0.6 mg total) by mouth daily for 7 days.   [DISCONTINUED] SEMGLEE, YFGN, 100 UNIT/ML Pen Inject 9 Units into the skin at bedtime.   No facility-administered encounter medications on file as of 02/12/2021.     Past Medical History:  Diagnosis Date    Arthritis    Chronic kidney disease    self reports ckd stage 3    Depression    Diabetes mellitus, type II, insulin dependent ([place])    With neurologic complications. 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    Hypertensive nephropathy 07/05/2018   Hypothyroidism    Hypothyroidism 02/06/2018   Morbid obesity with BMI of 50.0-59.9, adult ([place])    Scoliosis     Past Surgical History:  Procedure Laterality Date   ABDOMINAL HYSTERECTOMY     uterus   CHOLECYSTECTOMY     DIAGNOSTIC LAPAROSCOPY     DILATION AND CURETTAGE OF UTERUS     EYE SURGERY     cataract extraction bilateral    INCISION AND DRAINAGE OF WOUND N/A 10/15/2020   Procedure: IRRIGATION AND DEBRIDEMENT WOUND;  Surgeon: Wallace Going, DO;  Location: [facility];  Service: Plastics;  Laterality: N/A;  60 min   JOINT REPLACEMENT     Left knee   KNEE ARTHROPLASTY Right 07/30/2017   Procedure: RIGHT TOTAL KNEE ARTHROPLASTY WITH COMPUTER NAVIGATION;  Surgeon: Rod Can, MD;  Location: [facility];  Service: Orthopedics;  Laterality: Right;  Needs RNFA   LAPAROSCOPIC GASTRIC BANDING     and reversal   LAPAROSCOPIC GASTRIC SLEEVE RESECTION N/A 08/26/2016   Procedure: LAPAROSCOPIC GASTRIC SLEEVE RESECTION WITH UPPER ENOD;  Surgeon: Johnathan Hausen, MD;  Location: [facility];  Service: General;  Laterality: N/A;   PANNICULECTOMY N/A 09/26/2020   Procedure: PANNICULECTOMY;  Surgeon: Wallace Going, DO;  Location: [facility];  Service: Plastics;  Laterality: N/A;   TONSILLECTOMY     TRANSTHORACIC ECHOCARDIOGRAM  11/2013   EF 65-70%. Normal diastolic Fxn.  Normal Valves.   UMBILICAL HERNIA REPAIR N/A 09/26/2020   Procedure: HERNIA REPAIR UMBILICAL ADULT;  Surgeon: Georganna Skeans, MD;  Location: [place];  Service: General;  Laterality: N/A;    Family History  Problem Relation Age of Onset   Heart attack Mother    Heart failure Mother    Stroke Father     Social History   Social History Narrative   Not on file     Review of Systems General: Denies fevers or chills Skin: Denies any redness or worsening incisional site pain.  Endorses mild drainage, nonpurulent.  Physical Exam Vitals with BMI 01/16/2021 12/25/2020 11/22/2020  Height 5\' 5"  5\' 5"  5\' 5"   Weight 181 lbs 181 lbs 3 oz 200 lbs 3 oz  BMI 30.12 30.15  01.77  Systolic 939 030 092  Diastolic 80 68 68  Pulse 81 87 84    General:  No acute distress, nontoxic appearing  Respiratory: No increased work of breathing Neuro: Alert and oriented Psychiatric: Normal mood and affect  Skin: Panniculectomy incision appears to be almost entirely healed.  Small, less than 1 cm superficial wound that is nondraining over right side of incision near the lateral aspect.  The wound left of midline now appears to be more of a hypergranulation.  No drainage on my exam.  No surrounding erythema or cellulitic changes noted.  Assessment/Plan  Area of hypergranulation was chemically cauterized here in clinic with silver nitrate.  Applied Xeroform followed by 4 x 4 gauze and secured with Medipore tape over cautery site as well as over her small, superficial nondraining wound right side of incision.  The remainder of her incision looks incredibly well-healed.  She is absolutely able to transition into spanks as long as she is keeping those 2 final spots clean and covered.  She still has the assistance of HHN.  Denies any systemic symptoms and there is no evidence of cellulitis on my exam.  Suspect that she will be completely healed in the next 4 to 6 weeks.  Picture(s) obtained of the patient and placed in the chart were with the patient's or guardian's permission.   Krista Blue 02/12/2021, 10:29 AM

## 2021-02-12 ENCOUNTER — Other Ambulatory Visit: Payer: Self-pay

## 2021-02-12 ENCOUNTER — Ambulatory Visit (INDEPENDENT_AMBULATORY_CARE_PROVIDER_SITE_OTHER): Payer: Medicare HMO | Admitting: Physician Assistant

## 2021-02-12 DIAGNOSIS — Z9889 Other specified postprocedural states: Secondary | ICD-10-CM | POA: Diagnosis not present

## 2021-02-14 ENCOUNTER — Encounter: Payer: Medicare HMO | Admitting: Nurse Practitioner

## 2021-02-15 ENCOUNTER — Ambulatory Visit (INDEPENDENT_AMBULATORY_CARE_PROVIDER_SITE_OTHER): Payer: Medicare HMO

## 2021-02-15 ENCOUNTER — Telehealth: Payer: Medicare HMO

## 2021-02-15 DIAGNOSIS — E1122 Type 2 diabetes mellitus with diabetic chronic kidney disease: Secondary | ICD-10-CM

## 2021-02-15 DIAGNOSIS — I129 Hypertensive chronic kidney disease with stage 1 through stage 4 chronic kidney disease, or unspecified chronic kidney disease: Secondary | ICD-10-CM

## 2021-02-15 DIAGNOSIS — F419 Anxiety disorder, unspecified: Secondary | ICD-10-CM

## 2021-02-15 DIAGNOSIS — N183 Chronic kidney disease, stage 3 unspecified: Secondary | ICD-10-CM

## 2021-02-15 DIAGNOSIS — Z6836 Body mass index (BMI) 36.0-36.9, adult: Secondary | ICD-10-CM

## 2021-02-15 DIAGNOSIS — M793 Panniculitis, unspecified: Secondary | ICD-10-CM

## 2021-02-15 DIAGNOSIS — M7989 Other specified soft tissue disorders: Secondary | ICD-10-CM

## 2021-02-19 NOTE — Patient Instructions (Signed)
Visit Information  Thank you for taking time to visit with me today. Please don't hesitate to contact me if I can be of assistance to you before our next scheduled telephone appointment.  Following are the goals we discussed today:  (Copy and paste patient goals from clinical care plan here)  Our next appointment is by telephone on 03/19/21 at 11:05 AM  Please call the care guide team at [phone] if you need to cancel or reschedule your appointment.   If you are experiencing a Mental Health or [place] or need someone to talk to, please call [phone]-TALK (toll free, 24 hour hotline)   Patient verbalizes understanding of instructions provided today and agrees to view in [place].   Barb Merino, RN, BSN, CCM Care Management Coordinator [facility]  Direct Phone: [phone]

## 2021-02-19 NOTE — Chronic Care Management (AMB) (Signed)
Chronic Care Management   CCM RN Visit Note  02/19/2021 Name: Teresa Golden MRN: [id number] DOB: 1945-03-12  Subjective: Teresa Golden is a 75 y.o. year old female who is a primary care patient of Glendale Chard, MD. The care management team was consulted for assistance with disease management and care coordination needs.    Engaged with patient by telephone for follow up visit in response to provider referral for case management and/or care coordination services.   Consent to Services:  The patient was given information about Chronic Care Management services, agreed to services, and gave verbal consent prior to initiation of services.  Please see initial visit note for detailed documentation.   Patient agreed to services and verbal consent obtained.   Assessment: Review of patient past medical history, allergies, medications, health status, including review of consultants reports, laboratory and other test data, was performed as part of comprehensive evaluation and provision of chronic care management services.   SDOH (Social Determinants of Health) assessments and interventions performed:  Yes, no acute challenges   CCM Care Plan  Allergies  Allergen Reactions   Meloxicam Other (See Comments)    Fever; muscle aches; "flu-like" symptoms "Allergic," per [facility]   Other Other (See Comments)    "Rose fever and hay fever"    Outpatient Encounter Medications as of 02/15/2021  Medication Sig   acetaminophen (TYLENOL) 325 MG tablet Take 2 tablets (650 mg total) by mouth every 6 (six) hours as needed for moderate pain or mild pain.   amLODipine (NORVASC) 5 MG tablet Take 1 tablet by mouth once daily   B Complex-C (B-COMPLEX WITH VITAMIN C) tablet Take 1 tablet by mouth daily.   Biotin 10000 MCG TABS Take 10,000 mcg by mouth daily.   Cholecalciferol 5000 units TABS Take 5,000 Units by mouth daily.   Cranberry-Vitamin C 250-60 MG CAPS Take 1 capsule by mouth daily.   diclofenac Sodium  (VOLTAREN) 1 % GEL APPLY 2 GRAMS TO AFFECTED AREA(S) 4 TIMES DAILY AS NEEDED (Patient taking differently: Apply 2-4 g topically every 6 (six) hours as needed (to affected areas).)   gabapentin (NEURONTIN) 300 MG capsule Take 600 mg by mouth at bedtime.   glucose blood (ONETOUCH VERIO) test strip USE TO CHECK BLOOD SUGAR   TWO TIMES A DAY AS         INSTRUCTED   hydrochlorothiazide (HYDRODIURIL) 25 MG tablet Take 1 tablet (25 mg total) by mouth daily.   Lancets (ONETOUCH DELICA PLUS [id number]) MISC USE TO CHECK BLOOD SUGARS  TWO TIMES A DAY AS         INSTRUCTED   metoprolol succinate (TOPROL-XL) 25 MG 24 hr tablet TAKE 1 TABLET AT BEDTIME   (DISCONTINUE COREG)   Multiple Vitamins-Minerals (MULTIVITAMIN WITH MINERALS) tablet Take 1 tablet by mouth daily. Women's 50+   polyethylene glycol (MIRALAX / GLYCOLAX) 17 g packet Take 17 g by mouth daily.   rivaroxaban (XARELTO) 20 MG TABS tablet Take 1 tablet (20 mg total) by mouth daily with supper.   Semaglutide, 1 MG/DOSE, (OZEMPIC, 1 MG/DOSE,) 4 MG/3ML SOPN Inject 1 mg into the skin once a week.   Semaglutide, 1 MG/DOSE, (OZEMPIC, 1 MG/DOSE,) 4 MG/3ML SOPN Inject 1 mg into the skin once a week.   simvastatin (ZOCOR) 10 MG tablet Take 1 tablet (10 mg total) by mouth daily. (Patient taking differently: Take 10 mg by mouth at bedtime.)   SYNTHROID 112 MCG tablet Take 112 mcg by mouth daily before breakfast.  No facility-administered encounter medications on file as of 02/15/2021.    Patient Active Problem List   Diagnosis Date Noted   Infected wound 11/02/2020   Pressure injury of skin 10/22/2020   Wound infection after surgery 10/12/2020   Normocytic anemia 10/12/2020   AKI (acute kidney injury) ([place]) 10/12/2020   Obesity (BMI 30-39.9) 10/12/2020   Sepsis ([place]) 10/12/2020   Type 2 diabetes mellitus with stage 3 chronic kidney disease, with long-term current use of insulin ([place]) 11/29/2019   Panniculitis 09/30/2019   Back pain 09/30/2019    Hepatoma ([place]) 01/21/2019   Other cirrhosis of liver ([place]) 07/05/2018   Hypertensive nephropathy 07/05/2018   Chronic renal disease, stage III ([place]) 07/05/2018   Chronic left shoulder pain 07/05/2018   Hypothyroidism 02/06/2018   Hepatitis C virus infection cured after antiviral drug therapy 02/04/2018   Osteoarthritis of right knee 07/30/2017   S/P laparoscopic sleeve gastrectomy July 2018 08/26/2016   Preop cardiovascular exam 07/30/2016   Dyspnea on exertion 07/30/2016   Bilateral lower extremity edema 07/30/2016    Conditions to be addressed/monitored: DM, CKD III, Hypertensive Nephropathy, Class 2 severe obesity, Anxiety, Panniculitis, localized swelling of lower extremity  Care Plan : RN Care Manager Plan of Care  Updates made by Lynne Logan, RN since 02/19/2021 12:00 AM     Problem: No plan of care established for management of chronic disease states (DM, CKD III, Hypertensive Nephropathy, Class 2 severe obesity, Anxiety, Panniculitis, localized swelling of lower extremity)   Priority: High     Long-Range Goal: Development of plan for management of chronic disease management for DM, CKD III, Hypertensive Nephropathy, Class 2 severe obesity, Anxiety, Panniculitis, localized swelling of lower extremity   Start Date: 12/31/2020  Expected End Date: 12/31/2021  Recent Progress: On track  Priority: High  Note:   Current Barriers:  Knowledge Deficits related to plan of care for management of DM, CKD III, Hypertensive Nephropathy, Class 2 severe obesity, Anxiety, Panniculitis, localized swelling of lower extremity Chronic Disease Management support and education needs related to DM, CKD III, Hypertensive Nephropathy, Class 2 severe obesity, Anxiety, Panniculitis, localized swelling of lower extremity  RNCM Clinical Goal(s):  Patient will verbalize basic understanding of  DM, CKD III, Hypertensive Nephropathy, Class 2 severe obesity, Anxiety, Panniculitis, localized swelling of  lower extremity disease process and self health management plan   demonstrate Improved health management independence   continue to work with RN Care Manager to address care management and care coordination needs related to  DM, CKD III, Hypertensive Nephropathy, Class 2 severe obesity, Anxiety, Panniculitis, localized swelling of lower extremity will demonstrate ongoing self health care management ability    through collaboration with RN Care manager, provider, and care team.  Interventions: 1:1 collaboration with primary care provider regarding development and update of comprehensive plan of care as evidenced by provider attestation and co-signature Inter-disciplinary care team collaboration (see longitudinal plan of care) Evaluation of current treatment plan related to  self management and patient's adherence to plan as established by provider  Hypertension Interventions:  (Status:  Goal on track:  Yes.) Long Term Goal Last practice recorded BP readings:  BP Readings from Last 3 Encounters:  01/16/21 (!) 154/80  12/25/20 120/68  11/22/20 134/68  Most recent eGFR/CrCl:  Lab Results  Component Value Date   EGFR 46 (L) 12/25/2020    No components found for: CRCL  Evaluation of current treatment plan related to hypertension self management and patient's adherence to plan as established by  provider Reviewed medications with patient and discussed importance of compliance, reviewed recent BP med change per Dr. Baird Cancer Advised patient, providing education and rationale, to monitor blood pressure daily and record, calling PCP for findings outside established parameters Provided education on prescribed diet low Sodium Educated patient while providing rationale the importance to restrict Sodium to help manage hypertension  Reviewed scheduled/upcoming provider appointments including: next PCP follow up appointment scheduled for 02/27/21 '@9' :00 AM  Discussed plans with patient for ongoing care  management follow up and provided patient with direct contact information for care management team  Abdominal Wound: (Status: Goal on track:  Yes) Evaluation of current treatment plan related to  Abdominal wound , self-management and patient's adherence to plan as established by provider Review of patient status, including review of consultant's reports, relevant laboratory and other test results, and medications completed Assessed for patient understanding of currently prescribed treatment plan for wound care and follow up Assessed for knowledge and understanding related to wound infection and when to seek medical attention if needed  Determined patient continues to receive skilled nursing visits for wound care and confirms having the contact number for this provider  Screening for signs and symptoms of depression related to chronic disease state Discussed plans with patient for ongoing care management follow up and provided patient with direct contact information for care management team;   Diabetes Interventions: (Status: Goal on track: Yes) Assessed patient's understanding of A1c goal: <6.5% Provided education to patient about basic DM disease process; Reviewed medications with patient and discussed importance of medication adherence; Determined patient is running low on Ozempic Sent in basket message to embedded Pharm D [name] and PCP notifying patient is low on Ozempic supply  Advised patient, providing education and rationale, to check cbg daily before meals and at bedtime and record, calling PCP and or RN CM for findings outside established parameters; Review of patient status, including review of consultants reports, relevant laboratory and other test results, and medications completed; Educated on hypoglycemic/hyperglycemic protocol and how to self manage, when to call the PCP Discussed plans with patient for ongoing care management follow up and provided patient with direct  contact information for care management team; Lab Results  Component Value Date   HGBA1C 6.3 (H) 12/25/2020    DVT of Lower Extremity: (Status: Goal on track:  Yes) Evaluation of current treatment plan related to  DVT of LE , self-management and patient's adherence to plan as established by provider Reviewed medications with patient and discussed importance of taking this medication exactly as prescribed without missed doses unless directed by PCP Determined patient ran out of Xarelto this past week, she was unable to afford to pay out of pocket for her prescription, patient did not notify PCP Collaborated with PCP and embedded Pharm D regarding patient's financial hardship, advised patient took her last dose of Xarelto this week on Tuesday Reviewed scheduled/upcoming provider appointments including: next PCP follow up appointment scheduled for 02/27/21 '@9' :00 AM Sent in basket message to PCP provider Bary Castilla NP regarding patient's need for samples of Xarelto and determination if vascular doppler of DVT is needed  Discussed plans with patient for ongoing care management follow up and provided patient with direct contact information for care management team  Patient Goals/Self-Care Activities: Take all medications as prescribed Attend all scheduled provider appointments Call pharmacy for medication refills 3-7 days in advance of running out of medications Perform all self care activities independently  Perform IADL's (shopping, preparing meals, housekeeping, managing  finances) independently Call provider office for new concerns or questions  drink 6 to 8 glasses of water each day manage portion size keep a blood pressure log take blood pressure log to all doctor appointments call doctor for signs and symptoms of high blood pressure report new symptoms to your doctor  Follow Up Plan:  Telephone follow up appointment with care management team member scheduled for:  03/19/21       Plan:Telephone follow up appointment with care management team member scheduled for:  03/19/21  Barb Merino, RN, BSN, CCM Care Management Coordinator [facility]  Direct Phone: [phone]

## 2021-02-20 ENCOUNTER — Ambulatory Visit
Admission: RE | Admit: 2021-02-20 | Discharge: 2021-02-20 | Disposition: A | Discharge status: Home / Self Care | Payer: Medicare HMO | Source: Ambulatory Visit | Attending: Nurse Practitioner | Admitting: Nurse Practitioner

## 2021-02-20 DIAGNOSIS — K7469 Other cirrhosis of liver: Secondary | ICD-10-CM

## 2021-02-21 ENCOUNTER — Ambulatory Visit: Payer: Medicare HMO | Admitting: Addiction (Substance Use Disorder)

## 2021-02-23 DIAGNOSIS — Z794 Long term (current) use of insulin: Secondary | ICD-10-CM

## 2021-02-23 DIAGNOSIS — I129 Hypertensive chronic kidney disease with stage 1 through stage 4 chronic kidney disease, or unspecified chronic kidney disease: Secondary | ICD-10-CM

## 2021-02-23 DIAGNOSIS — E1122 Type 2 diabetes mellitus with diabetic chronic kidney disease: Secondary | ICD-10-CM

## 2021-02-23 DIAGNOSIS — N182 Chronic kidney disease, stage 2 (mild): Secondary | ICD-10-CM

## 2021-02-23 DIAGNOSIS — N183 Chronic kidney disease, stage 3 unspecified: Secondary | ICD-10-CM | POA: Diagnosis not present

## 2021-02-26 ENCOUNTER — Other Ambulatory Visit: Payer: Medicare HMO

## 2021-02-26 ENCOUNTER — Telehealth: Payer: Self-pay

## 2021-02-26 NOTE — Telephone Encounter (Signed)
The pt was told that her novo fine pen needles is ready for pickup from the patient assistance.

## 2021-02-27 ENCOUNTER — Ambulatory Visit (INDEPENDENT_AMBULATORY_CARE_PROVIDER_SITE_OTHER): Payer: Medicare HMO | Admitting: Nurse Practitioner

## 2021-02-27 ENCOUNTER — Telehealth: Payer: Self-pay

## 2021-02-27 ENCOUNTER — Other Ambulatory Visit: Payer: Self-pay

## 2021-02-27 ENCOUNTER — Other Ambulatory Visit (HOSPITAL_COMMUNITY): Payer: Self-pay

## 2021-02-27 VITALS — BP 130/70 | HR 77 | Temp 97.5°F | Ht 65.0 in | Wt 203.0 lb

## 2021-02-27 DIAGNOSIS — Z794 Long term (current) use of insulin: Secondary | ICD-10-CM

## 2021-02-27 DIAGNOSIS — I129 Hypertensive chronic kidney disease with stage 1 through stage 4 chronic kidney disease, or unspecified chronic kidney disease: Secondary | ICD-10-CM | POA: Diagnosis not present

## 2021-02-27 DIAGNOSIS — I82452 Acute embolism and thrombosis of left peroneal vein: Secondary | ICD-10-CM

## 2021-02-27 DIAGNOSIS — E6609 Other obesity due to excess calories: Secondary | ICD-10-CM

## 2021-02-27 DIAGNOSIS — M25471 Effusion, right ankle: Secondary | ICD-10-CM

## 2021-02-27 DIAGNOSIS — Z13228 Encounter for screening for other metabolic disorders: Secondary | ICD-10-CM

## 2021-02-27 DIAGNOSIS — M10071 Idiopathic gout, right ankle and foot: Secondary | ICD-10-CM

## 2021-02-27 DIAGNOSIS — N183 Chronic kidney disease, stage 3 unspecified: Secondary | ICD-10-CM

## 2021-02-27 DIAGNOSIS — M5136 Other intervertebral disc degeneration, lumbar region: Secondary | ICD-10-CM

## 2021-02-27 DIAGNOSIS — Z6833 Body mass index (BMI) 33.0-33.9, adult: Secondary | ICD-10-CM

## 2021-02-27 DIAGNOSIS — E1122 Type 2 diabetes mellitus with diabetic chronic kidney disease: Secondary | ICD-10-CM

## 2021-02-27 DIAGNOSIS — D1803 Hemangioma of intra-abdominal structures: Secondary | ICD-10-CM

## 2021-02-27 DIAGNOSIS — M51369 Other intervertebral disc degeneration, lumbar region without mention of lumbar back pain or lower extremity pain: Secondary | ICD-10-CM

## 2021-02-27 DIAGNOSIS — Z0000 Encounter for general adult medical examination without abnormal findings: Secondary | ICD-10-CM

## 2021-02-27 DIAGNOSIS — M25472 Effusion, left ankle: Secondary | ICD-10-CM

## 2021-02-27 LAB — POCT UA - MICROALBUMIN
Albumin/Creatinine Ratio, Urine, POC: <30
Creatinine, POC: 300 mg/dL
Microalbumin Ur, POC: 30 mg/L

## 2021-02-27 LAB — POCT URINALYSIS DIPSTICK
Bilirubin, UA: NEGATIVE
Blood, UA: NEGATIVE
Glucose, UA: NEGATIVE
Ketones, UA: NEGATIVE
Leukocytes, UA: NEGATIVE
Nitrite, UA: NEGATIVE
Protein, UA: NEGATIVE
Spec Grav, UA: 1.015 (ref 1.010–1.025)
Urobilinogen, UA: 0.2 E.U./dL
pH, UA: 5.5 (ref 5.0–8.0)

## 2021-02-27 MED ORDER — COLCHICINE 0.6 MG PO TABS: ORAL_TABLET | ORAL | 0 refills | Status: DC

## 2021-02-27 NOTE — Progress Notes (Signed)
I,[name],acting as a Education administrator for [employer], NP.,have documented all relevant documentation on the behalf of [employer], NP,as directed by  Bary Castilla, NP while in the presence of Bary Castilla, NP.  This visit occurred during the SARS-CoV-2 public health emergency.  Safety protocols were in place, including screening questions prior to the visit, additional usage of staff PPE, and extensive cleaning of exam room while observing appropriate contact time as indicated for disinfecting solutions.  Subjective:     [name] , female    DOB: 1945/05/20 , 76 y.o.   MRN: [id number]   Chief Complaint  Patient presents with   Annual Exam    HPI  She is here for a physical exam today. She recently had a skin reduction due to weight loss and that became infected and then they had to go do another surgery and she had to go to rehab. Then she developed a clot. She lives alone. She has no family but she has church family. She is able to ADLs. She does not drive anymore. She take [place] transportation or or [employer]. No falls at home. She has some swelling in her ankles. She has history of gout  She has had her mammogram this year.     Past Medical History:  Diagnosis Date   Arthritis    Chronic kidney disease    self reports ckd stage 3    Depression    Diabetes mellitus, type II, insulin dependent ([place])    With neurologic complications. 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    Hypertensive nephropathy 07/05/2018   Hypothyroidism    Hypothyroidism 02/06/2018   Morbid obesity with BMI of 50.0-59.9, adult ([place])    Scoliosis      Family History  Problem Relation Age of Onset   Heart attack Mother     Heart failure Mother    Stroke Father      Current Outpatient Medications:    acetaminophen (TYLENOL) 325 MG tablet, Take 2 tablets (650 mg total) by mouth every 6 (six) hours as needed for moderate pain or mild pain., Disp: , Rfl:    amLODipine (NORVASC) 5 MG tablet, Take 1 tablet by mouth once daily, Disp: 90 tablet, Rfl: 0   B Complex-C (B-COMPLEX WITH VITAMIN C) tablet, Take 1 tablet by mouth daily., Disp: , Rfl:    Biotin 10000 MCG TABS, Take 10,000 mcg by mouth daily., Disp: , Rfl:    Cholecalciferol 5000 units TABS, Take 5,000 Units by mouth daily., Disp: , Rfl:    colchicine 0.6 MG tablet, Take 1 tablet by mouth for gout attack., Disp: 5 tablet, Rfl: 0   Cranberry-Vitamin C 250-60 MG CAPS, Take 1 capsule by mouth daily., Disp: , Rfl:    diclofenac Sodium (VOLTAREN) 1 % GEL, APPLY 2 GRAMS TO AFFECTED AREA(S) 4 TIMES DAILY AS NEEDED (Patient taking differently: Apply 2-4 g topically every 6 (six) hours as needed (to affected areas).), Disp: 200 g, Rfl: 2   gabapentin (NEURONTIN) 300 MG capsule, Take 600 mg by mouth at bedtime., Disp: , Rfl:    glucose blood (ONETOUCH VERIO) test strip, USE TO CHECK BLOOD SUGAR   TWO TIMES A DAY AS  INSTRUCTED, Disp: 200 strip, Rfl: 3   hydrochlorothiazide (HYDRODIURIL) 25 MG tablet, Take 1 tablet (25 mg total) by mouth daily., Disp: 30 tablet, Rfl: 0   Lancets (ONETOUCH DELICA PLUS [id number]) MISC, USE TO CHECK BLOOD SUGARS  TWO TIMES A DAY AS         INSTRUCTED, Disp: 200 each, Rfl: 3   metoprolol succinate (TOPROL-XL) 25 MG 24 hr tablet, TAKE 1 TABLET AT BEDTIME   (DISCONTINUE COREG), Disp: 90 tablet, Rfl: 1   Multiple Vitamins-Minerals (MULTIVITAMIN WITH MINERALS) tablet, Take 1 tablet by mouth daily. Women's 50+, Disp: , Rfl:    polyethylene glycol (MIRALAX / GLYCOLAX) 17 g packet, Take 17 g by mouth daily., Disp: 14 each, Rfl: 0   rivaroxaban (XARELTO) 20 MG TABS tablet, Take 1 tablet (20 mg total) by mouth daily with supper., Disp: 30 tablet,  Rfl: 1   Semaglutide, 1 MG/DOSE, (OZEMPIC, 1 MG/DOSE,) 4 MG/3ML SOPN, Inject 1 mg into the skin once a week., Disp: 9 mL, Rfl: 1   Semaglutide, 1 MG/DOSE, (OZEMPIC, 1 MG/DOSE,) 4 MG/3ML SOPN, Inject 1 mg into the skin once a week., Disp: 9 mL, Rfl: 1   simvastatin (ZOCOR) 10 MG tablet, Take 1 tablet (10 mg total) by mouth daily. (Patient taking differently: Take 10 mg by mouth at bedtime.), Disp: 90 tablet, Rfl: 1   SYNTHROID 112 MCG tablet, Take 112 mcg by mouth daily before breakfast., Disp: , Rfl:    Allergies  Allergen Reactions   Meloxicam Other (See Comments)    Fever; muscle aches; "flu-like" symptoms "Allergic," per MAR   Other Other (See Comments)    "Rose fever and hay fever"      The patient states she uses none for birth control. Last LMP was No LMP recorded. Patient has had a hysterectomy.. Negative for Dysmenorrhea. Negative for: breast discharge, breast lump(s), breast pain and breast self exam. Associated symptoms include abnormal vaginal bleeding. Pertinent negatives include abnormal bleeding (hematology), anxiety, decreased libido, depression, difficulty falling sleep, dyspareunia, history of infertility, nocturia, sexual dysfunction, sleep disturbances, urinary incontinence, urinary urgency, vaginal discharge and vaginal itching. Diet regular.The patient states her exercise level is    . The patient's tobacco use is:  Social History   Tobacco Use  Smoking Status Former   Packs/day: 0.25   Years: 10.00   Pack years: 2.50   Types: Cigarettes  Smokeless Tobacco Never  Tobacco Comments   quit 20 years ago  . She has been exposed to passive smoke. The patient's alcohol use is:  Social History   Substance and Sexual Activity  Alcohol Use Not Currently   Alcohol/week: 1.0 standard drink   Types: 1 Glasses of wine per week   Comment: occasional  . Additional information: Last pap , next one scheduled for .    Review of Systems  Constitutional: Negative.  Negative  for chills and fever.       Walks with walker   HENT: Negative.  Negative for congestion and sneezing.   Eyes: Negative.   Respiratory: Negative.  Negative for cough, shortness of breath and wheezing.   Cardiovascular: Negative.  Negative for chest pain and palpitations.  Gastrointestinal: Negative.  Negative for constipation and diarrhea.  Endocrine: Negative.  Negative for polydipsia, polyphagia and polyuria.  Genitourinary: Negative.   Musculoskeletal: Negative.  Negative for myalgias.  Skin: Negative.        Incision to abdomen covered with gauze  Allergic/Immunologic: Negative.   Neurological: Negative.  Negative for weakness  and headaches.  Hematological: Negative.   Psychiatric/Behavioral: Negative.      Today's Vitals   02/27/21 0920  BP: 130/70  Pulse: 77  Temp: (!) 97.5 F (36.4 C)  TempSrc: Oral  Weight: 203 lb (92.1 kg)  Height: '5\' 5"'  (1.651 m)   Body mass index is 33.78 kg/m.  Wt Readings from Last 3 Encounters:  02/27/21 203 lb (92.1 kg)  01/16/21 181 lb (82.1 kg)  12/25/20 181 lb 3.2 oz (82.2 kg)    Objective:  Physical Exam Vitals and nursing note reviewed.  Constitutional:      Appearance: Normal appearance.  HENT:     Head: Normocephalic and atraumatic.     Right Ear: Tympanic membrane, ear canal and external ear normal. There is no impacted cerumen.     Left Ear: Tympanic membrane, ear canal and external ear normal. There is no impacted cerumen.     Nose:     Comments: Masked     Mouth/Throat:     Comments: Masked  Eyes:     Extraocular Movements: Extraocular movements intact.     Conjunctiva/sclera: Conjunctivae normal.     Pupils: Pupils are equal, round, and reactive to light.  Cardiovascular:     Rate and Rhythm: Normal rate and regular rhythm.     Pulses: Normal pulses.     Heart sounds: Normal heart sounds. No murmur heard. Pulmonary:     Effort: Pulmonary effort is normal. No respiratory distress.     Breath sounds: Normal breath  sounds. No wheezing.  Chest:     Comments: Deferred  Abdominal:     General: Abdomen is flat. Bowel sounds are normal.     Palpations: Abdomen is soft.  Genitourinary:    Comments: deferred Musculoskeletal:        General: Normal range of motion.     Cervical back: Normal range of motion and neck supple.  Skin:    General: Skin is warm and dry.     Capillary Refill: Capillary refill takes less than 2 seconds.     Comments: Incision to lower abdomen covered with gauze from surgery   Neurological:     General: No focal deficit present.     Mental Status: She is alert and oriented to person, place, and time.  Psychiatric:        Mood and Affect: Mood normal.        Behavior: Behavior normal.        Assessment And Plan:     1. Encounter for annual physical exam --Patient is here for their annual physical exam and we discussed any changes to medication and medical history.  -Behavior modification was discussed as well as diet and exercise history  -Patient will continue to exercise regularly and modify their diet.  -Recommendation for yearly physical annuals, immunization and screenings including mammogram and colonoscopy were discussed with the patient.  -Recommended intake of multivitamin, vitamin D and calcium.  -Individualized advise was given to the patient pertaining to their own health history in regards to diet, exercise, medical condition and referrals.   2. Type 2 diabetes mellitus with stage 2 chronic kidney disease, with long-term current use of insulin (HCC) -Chronic, she is taking ozempic  -Will check labs today  -Advised patient on healthy eating  - Hemoglobin A1c  3. Hypertensive nephropathy -Chronic, stable  -Continue meds  -Will checks labs today  - CBC - CMP14+EGFR - POCT Urinalysis Dipstick (81002) - POCT UA - Microalbumin  4. Stage  3 chronic kidney disease, unspecified whether stage 3a or 3b CKD (HCC) - CBC - CMP14+EGFR  5. Degeneration of lumbar  intervertebral disc   6. Hemangioma of liver -Continue to follow up with specialist    7. Acute idiopathic gout of right ankle - Uric acid -Advised patient to not take more than 0.6 mg daily as needed.  - colchicine 0.6 MG tablet; Take 1 tablet by mouth for gout attack.  Dispense: 5 tablet; Refill: 0  8. Swelling of both ankles - VAS [place] LOWER EXTREMITY VENOUS (DVT); Future - Uric acid - D-dimer, quantitative (not at [facility])  9. Acute deep vein thrombosis (DVT) of left peroneal vein (HCC) Hx of DVT. She is taking Xarelto. Continues to have swelling will do doppler and check d-dimer   10. Screening for metabolic disorder --Educated patient about a diet that is low in fat and high fatty foods including dairy products. Increase in take of fish and fiber. Decrease intake of red meats and fast foods. Exercise for atleast 4-5 times a week or atleast 30-45 min. Drink a lot of water.   - Lipid panel  11. Class 1 obesity due to excess calories with serious comorbidity and body mass index (BMI) of 33.0 to 33.9 in adult. Advised patient on a healthy diet including avoiding fast food and red meats. Increase the intake of lean meats including grilled chicken and [place].  Drink a lot of water. Decrease intake of fatty foods. Exercise for 30-45 min. 4-5 a week to decrease the risk of cardiac event.   The patient was encouraged to call or send a message through [facility] for any questions or concerns.   Follow up: if symptoms persist or do not get better.   Side effects and appropriate use of all the medication(s) were discussed with the patient today. Patient advised to use the medication(s) as directed by their healthcare provider. The patient was encouraged to read, review, and understand all associated package inserts and contact our office with any questions or concerns. The patient accepts the risks of the treatment plan and had an opportunity to ask questions.   Side effects and appropriate use of  all the medication(s) were discussed with the patient today. Patient advised to use the medication(s) as directed by their healthcare provider. The patient was encouraged to read, review, and understand all associated package inserts and contact our office with any questions or concerns. The patient accepts the risks of the treatment plan and had an opportunity to ask questions.   Patient was given opportunity to ask questions. Patient verbalized understanding of the plan and was able to repeat key elements of the plan. All questions were answered to their satisfaction.  Raman Darolyn Double, DNP   I, Raman Amarii Amy have reviewed all documentation for this visit. The documentation on 02/28/20 for the exam, diagnosis, procedures, and orders are all accurate and complete.    THE PATIENT IS ENCOURAGED TO PRACTICE SOCIAL DISTANCING DUE TO THE COVID-19 PANDEMIC.  [name]

## 2021-02-27 NOTE — Chronic Care Management (AMB) (Signed)
error 

## 2021-02-27 NOTE — Chronic Care Management (AMB) (Addendum)
Chronic Care Management Pharmacy Assistant   Name: Teresa Golden  MRN: [id number] DOB: 10-May-1945  Reason for Encounter:Patient assistance follow up   Medications: Outpatient Encounter Medications as of 02/27/2021  Medication Sig   acetaminophen (TYLENOL) 325 MG tablet Take 2 tablets (650 mg total) by mouth every 6 (six) hours as needed for moderate pain or mild pain.   amLODipine (NORVASC) 5 MG tablet Take 1 tablet by mouth once daily   B Complex-C (B-COMPLEX WITH VITAMIN C) tablet Take 1 tablet by mouth daily.   Biotin 10000 MCG TABS Take 10,000 mcg by mouth daily.   Cholecalciferol 5000 units TABS Take 5,000 Units by mouth daily.   colchicine 0.6 MG tablet Take 1 tablet by mouth for gout attack.   Cranberry-Vitamin C 250-60 MG CAPS Take 1 capsule by mouth daily.   diclofenac Sodium (VOLTAREN) 1 % GEL APPLY 2 GRAMS TO AFFECTED AREA(S) 4 TIMES DAILY AS NEEDED (Patient taking differently: Apply 2-4 g topically every 6 (six) hours as needed (to affected areas).)   gabapentin (NEURONTIN) 300 MG capsule Take 600 mg by mouth at bedtime.   glucose blood (ONETOUCH VERIO) test strip USE TO CHECK BLOOD SUGAR   TWO TIMES A DAY AS         INSTRUCTED   hydrochlorothiazide (HYDRODIURIL) 25 MG tablet Take 1 tablet (25 mg total) by mouth daily.   Lancets (ONETOUCH DELICA PLUS [id number]) MISC USE TO CHECK BLOOD SUGARS  TWO TIMES A DAY AS         INSTRUCTED   metoprolol succinate (TOPROL-XL) 25 MG 24 hr tablet TAKE 1 TABLET AT BEDTIME   (DISCONTINUE COREG)   Multiple Vitamins-Minerals (MULTIVITAMIN WITH MINERALS) tablet Take 1 tablet by mouth daily. Women's 50+   polyethylene glycol (MIRALAX / GLYCOLAX) 17 g packet Take 17 g by mouth daily.   rivaroxaban (XARELTO) 20 MG TABS tablet Take 1 tablet (20 mg total) by mouth daily with supper.   Semaglutide, 1 MG/DOSE, (OZEMPIC, 1 MG/DOSE,) 4 MG/3ML SOPN Inject 1 mg into the skin once a week.   Semaglutide, 1 MG/DOSE, (OZEMPIC, 1 MG/DOSE,) 4 MG/3ML SOPN Inject  1 mg into the skin once a week.   simvastatin (ZOCOR) 10 MG tablet Take 1 tablet (10 mg total) by mouth daily. (Patient taking differently: Take 10 mg by mouth at bedtime.)   SYNTHROID 112 MCG tablet Take 112 mcg by mouth daily before breakfast.   [DISCONTINUED] SEMGLEE, YFGN, 100 UNIT/ML Pen Inject 9 Units into the skin at bedtime.   No facility-administered encounter medications on file as of 02/27/2021.   02-27-2021: Patient called stating she still hasn't received Ozempic from [place] patient assistance. Contacted [employer] and automated system stated Ozempic is now in the process of shipping. No tracking was provided. Contacted patient to update and informed patient I will send Dr. Baird Cancer a message to advise. Also told patient to try and call [employer] to speak with a rep. Patient is aware of the shortage.  03-05-2021: Finally got a representative on the phone and was told page 3 needs to be refaxed because signature was cut off to finish prepossessing last refill for 2022. Was told [id number] application is delayed and will be issued another 30 day free voucher. The representative tried getting in contact with the voucher department and was on hold for too long. Representative then explained that he will have to put in a request to call patient to give voucher information and to be expecting a call back  in 4-5 days. Patient was given a sample from the office. Patient was notified and will wait on the call from [place].   [place] Pharmacist Assistant [phone]

## 2021-02-27 NOTE — Patient Instructions (Signed)

## 2021-02-28 ENCOUNTER — Ambulatory Visit (INDEPENDENT_AMBULATORY_CARE_PROVIDER_SITE_OTHER): Payer: Medicare HMO | Admitting: Addiction (Substance Use Disorder)

## 2021-02-28 DIAGNOSIS — F4323 Adjustment disorder with mixed anxiety and depressed mood: Secondary | ICD-10-CM | POA: Diagnosis not present

## 2021-02-28 LAB — CMP14+EGFR
ALT: 24 IU/L (ref 0–32)
AST: 27 IU/L (ref 0–40)
Albumin/Globulin Ratio: 1.6 (ref 1.2–2.2)
Albumin: 4.6 g/dL (ref 3.7–4.7)
Alkaline Phosphatase: 86 IU/L (ref 44–121)
BUN/Creatinine Ratio: 19 (ref 12–28)
BUN: 21 mg/dL (ref 8–27)
Bilirubin Total: 0.4 mg/dL (ref 0.0–1.2)
CO2: 22 mmol/L (ref 20–29)
Calcium: 9.9 mg/dL (ref 8.7–10.3)
Chloride: 105 mmol/L (ref 96–106)
Creatinine, Ser: 1.11 mg/dL — ABNORMAL HIGH (ref 0.57–1.00)
Globulin, Total: 2.9 g/dL (ref 1.5–4.5)
Glucose: 120 mg/dL — ABNORMAL HIGH (ref 70–99)
Potassium: 3.9 mmol/L (ref 3.5–5.2)
Sodium: 139 mmol/L (ref 134–144)
Total Protein: 7.5 g/dL (ref 6.0–8.5)
eGFR: 52 mL/min/{1.73_m2} — ABNORMAL LOW (ref >59)

## 2021-02-28 LAB — LIPID PANEL
Chol/HDL Ratio: 2.2 ratio (ref 0.0–4.4)
Cholesterol, Total: 158 mg/dL (ref 100–199)
HDL: 73 mg/dL (ref >39)
LDL Chol Calc (NIH): 74 mg/dL (ref 0–99)
Triglycerides: 50 mg/dL (ref 0–149)
VLDL Cholesterol Cal: 11 mg/dL (ref 5–40)

## 2021-02-28 LAB — CBC
Hematocrit: 32.8 % — ABNORMAL LOW (ref 34.0–46.6)
Hemoglobin: 11.5 g/dL (ref 11.1–15.9)
MCH: 27.6 pg (ref 26.6–33.0)
MCHC: 35.1 g/dL (ref 31.5–35.7)
MCV: 79 fL (ref 79–97)
Platelets: 208 10*3/uL (ref 150–450)
RBC: 4.17 x10E6/uL (ref 3.77–5.28)
RDW: 13.8 % (ref 11.7–15.4)
WBC: 9.7 10*3/uL (ref 3.4–10.8)

## 2021-02-28 LAB — HEMOGLOBIN A1C
Est. average glucose Bld gHb Est-mCnc: 140 mg/dL
Hgb A1c MFr Bld: 6.5 % — ABNORMAL HIGH (ref 4.8–5.6)

## 2021-02-28 LAB — D-DIMER, QUANTITATIVE: D-DIMER: 0.38 mg/L FEU (ref 0.00–0.49)

## 2021-02-28 LAB — URIC ACID: Uric Acid: 8.3 mg/dL — ABNORMAL HIGH (ref 3.1–7.9)

## 2021-02-28 NOTE — Progress Notes (Signed)
°      [facility] Counselor/Therapist Progress Note  [name], MRN: [id number],    Date: 02/28/2021   Time Spent:  35mins  Treatment Type: Individual Therapy  Reported Symptoms: ready to get better, some low moods.   Mental Status Exam:  Appearance:   Casual and Well Groomed     Behavior:  Appropriate and Sharing  Motor:  Normal  Speech/Language:   Clear and Coherent and Normal Rate  Affect:  Appropriate  Mood:  normal  Thought process:  normal  Thought content:    Rumination  Sensory/Perceptual disturbances:    WNL  Orientation:  x4  Attention:  Good  Concentration:  Good  Memory:  WNL  Fund of knowledge:   Good  Insight:    Good  Judgment:   Good  Impulse Control:  Fair   Risk Assessment: Danger to Self:  No Self-injurious Behavior: No Danger to Others: No Duty to Warn:no Physical Aggression / Violence:No  Access to Firearms a concern: No  Gang Involvement:No    Subjective: Client reported having ongoing low moods and needing some medication to stabilize out her moods and thoughts. Client reported low motivation and not having a clear mind and feeling very negative about her life and her strength/ worth. Therapist used MI to affirm her inner strength but also her physical recovery and hard work she has put in to recover. Therapist also used client's strong faith to encourage her in worth, regardless of what things she accomplishes in the world or within her day. Therapist used CBT to help client challenge negative self talk and say to herself her strong worth. Client also reported looking for a nice partner in her life, as she has been more lonely these days. Therapist assessed for stability and client denied SI/HI/AVH.   Interventions: Cognitive Behavioral Therapy, Motivational Interviewing, and RPT    Diagnosis:   ICD-10-CM   1. Adjustment disorder with mixed anxiety and depressed mood  F43.23        Plan of Care:  Client is to return to  therapy with therapist every 1-2 weeks as needed to process traumas/ frustrations in a safe space, to be re-evaluated in 3 months.  Client is to practice mindfulness AEB daily meditation and body scans or as needed when flooded by emotion/pain.  Client is to learn/practice DBT wise mind & radical acceptance. Client is to practice self-compassion AEB being gentle with themselves, utilizing self-care techniques daily or as needed when grieving something in the moment.  Client is to process grief/pain of their loss in a somatic body-felt sense way: ie using mindfulness, brainspotting, or trauma release as a method for releasing body pain/tension caused by grief.  Barnie Del, LCSW, LCAS, CCTP, CCS-I, BSP

## 2021-03-05 ENCOUNTER — Telehealth: Payer: Self-pay

## 2021-03-05 ENCOUNTER — Telehealth: Payer: Medicare HMO

## 2021-03-05 NOTE — Telephone Encounter (Signed)
°  Care Management   Follow Up Note   03/05/2021 Name: Teresa Golden MRN: [id number] DOB: [birth year]   Referred by: Glendale Chard, MD Reason for referral : Chronic Care Management (Inbound call from patient )  Received a voice message from patient requesting a return phone call.  An unsuccessful telephone outreach was attempted today. The patient was referred to the case management team for assistance with care management and care coordination.   Follow Up Plan: A HIPPA compliant phone message was left for the patient providing contact information and requesting a return call.   Barb Merino, RN, BSN, CCM Care Management Coordinator [facility]  Direct Phone: [phone]

## 2021-03-07 ENCOUNTER — Other Ambulatory Visit: Payer: Self-pay | Admitting: Nurse Practitioner

## 2021-03-07 ENCOUNTER — Ambulatory Visit (HOSPITAL_COMMUNITY)
Admission: RE | Admit: 2021-03-07 | Discharge: 2021-03-07 | Disposition: A | Discharge status: Home / Self Care | Payer: Medicare HMO | Source: Ambulatory Visit | Attending: Cardiology | Admitting: Cardiology

## 2021-03-07 ENCOUNTER — Other Ambulatory Visit: Payer: Self-pay

## 2021-03-07 DIAGNOSIS — M25472 Effusion, left ankle: Secondary | ICD-10-CM | POA: Diagnosis present

## 2021-03-07 DIAGNOSIS — I82452 Acute embolism and thrombosis of left peroneal vein: Secondary | ICD-10-CM

## 2021-03-07 DIAGNOSIS — M25471 Effusion, right ankle: Secondary | ICD-10-CM | POA: Insufficient documentation

## 2021-03-07 DIAGNOSIS — R600 Localized edema: Secondary | ICD-10-CM | POA: Diagnosis not present

## 2021-03-08 LAB — HM DIABETES EYE EXAM

## 2021-03-11 ENCOUNTER — Telehealth: Payer: Self-pay | Admitting: Hematology and Oncology

## 2021-03-11 NOTE — Telephone Encounter (Signed)
Scheduled appt per 1/12 referral. Pt is aware of appt date and time. Pt is aware to arrive 15 mins prior to appt time.  °

## 2021-03-12 NOTE — Progress Notes (Signed)
Referring Provider Glendale Chard, MD [address] [place],  [place] [address]   CC:  Chief Complaint  Patient presents with   Skin Problem      Teresa Golden is an 76 y.o. female.  HPI: Patient is a 76 year old female with PMH of type II DM, HTN, CKD, and recent hospitalizations x2 for wound infection secondary to panniculectomy performed 09/26/2020 by Dr. Marla Roe.  Patient was last seen here in clinic on 02/12/2021.  At that time, she endorsed continued improvement.  She was looking forward to participating in water aerobics.  She denied any pain symptoms or symptoms otherwise concerning for infection.  Physical exam was entirely reassuring.  Small, less than 1 cm superficial nondraining wound over right side of incision laterally.  No cellulitic findings noted.  There was an area of hypergranulation that was chemically cauterized with silver nitrate.  Plan was for transition into spanks and Xeroform and gauze secured with Medipore tape over small wound sites.  Today, patient is doing very well.  She states that she is no longer requiring any dressing changes at home because her wounds have finally healed over completely.  She states that she is slowly getting her strength back and is starting to go to the gym once per week.  She plans to resume water aerobics in a few months, per our plan, for which she is very excited.  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  She feels very blessed and is pleased that she is now fully recovered and is happy with her surgical outcome.  She denies any abdominal pain, recent infection, wounds, or other concerning symptoms.  She states that she has small, focal areas of mild bruising from Ozempic injection while on Xarelto.  Allergies  Allergen Reactions   Meloxicam Other (See Comments)    Fever; muscle aches; "flu-like" symptoms "Allergic," per [facility]   Other Other (See Comments)     "Rose fever and hay fever"    Outpatient Encounter Medications as of 03/14/2021  Medication Sig   acetaminophen (TYLENOL) 325 MG tablet Take 2 tablets (650 mg total) by mouth every 6 (six) hours as needed for moderate pain or mild pain.   amLODipine (NORVASC) 5 MG tablet Take 1 tablet by mouth once daily   B Complex-C (B-COMPLEX WITH VITAMIN C) tablet Take 1 tablet by mouth daily.   Biotin 10000 MCG TABS Take 10,000 mcg by mouth daily.   Cholecalciferol 5000 units TABS Take 5,000 Units by mouth daily.   colchicine 0.6 MG tablet Take 1 tablet by mouth for gout attack.   Cranberry-Vitamin C 250-60 MG CAPS Take 1 capsule by mouth daily.   diclofenac Sodium (VOLTAREN) 1 % GEL APPLY 2 GRAMS TO AFFECTED AREA(S) 4 TIMES DAILY AS NEEDED (Patient taking differently: Apply 2-4 g topically every 6 (six) hours as needed (to affected areas).)   gabapentin (NEURONTIN) 300 MG capsule Take 600 mg by mouth at bedtime.   glucose blood (ONETOUCH VERIO) test strip USE TO CHECK BLOOD SUGAR   TWO TIMES A DAY AS         INSTRUCTED   hydrochlorothiazide (HYDRODIURIL) 25 MG tablet Take 1 tablet (25 mg total) by mouth daily.   Lancets (ONETOUCH DELICA PLUS [id number]) MISC USE TO CHECK BLOOD SUGARS  TWO TIMES A DAY AS         INSTRUCTED   metoprolol succinate (TOPROL-XL) 25 MG 24 hr tablet  TAKE 1 TABLET AT BEDTIME   (DISCONTINUE COREG)   Multiple Vitamins-Minerals (MULTIVITAMIN WITH MINERALS) tablet Take 1 tablet by mouth daily. Women's 50+   polyethylene glycol (MIRALAX / GLYCOLAX) 17 g packet Take 17 g by mouth daily.   rivaroxaban (XARELTO) 20 MG TABS tablet Take 1 tablet (20 mg total) by mouth daily with supper.   Semaglutide, 1 MG/DOSE, (OZEMPIC, 1 MG/DOSE,) 4 MG/3ML SOPN Inject 1 mg into the skin once a week.   Semaglutide, 1 MG/DOSE, (OZEMPIC, 1 MG/DOSE,) 4 MG/3ML SOPN Inject 1 mg into the skin once a week.   simvastatin (ZOCOR) 10 MG tablet Take 1 tablet (10 mg total) by mouth daily. (Patient taking differently:  Take 10 mg by mouth at bedtime.)   SYNTHROID 112 MCG tablet Take 112 mcg by mouth daily before breakfast.   [DISCONTINUED] SEMGLEE, YFGN, 100 UNIT/ML Pen Inject 9 Units into the skin at bedtime.   No facility-administered encounter medications on file as of 03/14/2021.     Past Medical History:  Diagnosis Date   Arthritis    Chronic kidney disease    self reports ckd stage 3    Depression    Diabetes mellitus, type II, insulin dependent ([place])    With neurologic complications. 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    Hypertensive nephropathy 07/05/2018   Hypothyroidism    Hypothyroidism 02/06/2018   Morbid obesity with BMI of 50.0-59.9, adult ([place])    Scoliosis     Past Surgical History:  Procedure Laterality Date   ABDOMINAL HYSTERECTOMY     uterus   CHOLECYSTECTOMY     DIAGNOSTIC LAPAROSCOPY     DILATION AND CURETTAGE OF UTERUS     EYE SURGERY     cataract extraction bilateral    INCISION AND DRAINAGE OF WOUND N/A 10/15/2020   Procedure: IRRIGATION AND DEBRIDEMENT WOUND;  Surgeon: Wallace Going, DO;  Location: [facility];  Service: Plastics;  Laterality: N/A;  60 min   JOINT REPLACEMENT     Left knee   KNEE ARTHROPLASTY Right 07/30/2017   Procedure: RIGHT TOTAL KNEE ARTHROPLASTY WITH COMPUTER NAVIGATION;  Surgeon: Rod Can, MD;  Location: [facility];  Service: Orthopedics;  Laterality: Right;  Needs RNFA   LAPAROSCOPIC GASTRIC BANDING     and reversal   LAPAROSCOPIC GASTRIC SLEEVE RESECTION N/A 08/26/2016   Procedure: LAPAROSCOPIC GASTRIC SLEEVE RESECTION WITH UPPER ENOD;  Surgeon: Johnathan Hausen, MD;  Location: [facility];  Service: General;  Laterality: N/A;   PANNICULECTOMY N/A 09/26/2020   Procedure: PANNICULECTOMY;  Surgeon:  Wallace Going, DO;  Location: [facility];  Service: Plastics;  Laterality: N/A;   TONSILLECTOMY     TRANSTHORACIC ECHOCARDIOGRAM  11/2013   EF 65-70%. Normal diastolic Fxn.  Normal Valves.   UMBILICAL HERNIA REPAIR N/A 09/26/2020   Procedure: HERNIA REPAIR UMBILICAL ADULT;  Surgeon: Georganna Skeans, MD;  Location: [facility];  Service: General;  Laterality: N/A;    Family History  Problem Relation Age of Onset   Heart attack Mother    Heart failure Mother    Stroke Father     Social History   Social History Narrative   Not on file     Review of Systems General: Denies  fevers or chills Skin: Denies worsening wounds or worsening drainage Abdomen: Denies pain  Physical Exam Vitals with BMI 02/27/2021 01/16/2021 12/25/2020  Height 5\' 5"  5\' 5"  5\' 5"   Weight 203 lbs 181 lbs 181 lbs 3 oz  BMI 33.78 38.10 17.51  Systolic 025 852 778  Diastolic 70 80 68  Pulse 77 81 87    General:  No acute distress, nontoxic appearing  Respiratory: No increased work of breathing Neuro: Alert and oriented Psychiatric: Normal mood and affect  Abdomen: Small bruise.  Nontender.  Panniculectomy incision is completely healed.  No wounds.  No surrounding cellulitic changes.  Assessment/Plan  Patient has now fully recovered from her panniculectomy.  Patient to continue avoiding submersion in bath or pool for at least a couple more months, otherwise no restrictions at this time.  No dressing changes needed.  Scar cream if desired.    Patient is doing well and is pleased.  No specific follow-up needed.  She may call should she develop any questions or concerns.  Picture(s) obtained of the patient and placed in the chart were with the patient's or guardian's permission.   Krista Blue 03/14/2021, 9:40 AM

## 2021-03-13 ENCOUNTER — Telehealth: Payer: Self-pay

## 2021-03-13 ENCOUNTER — Other Ambulatory Visit: Payer: Self-pay

## 2021-03-13 NOTE — Chronic Care Management (AMB) (Signed)
03/13/21- Called patient to inform that Patient assistance medication Ozempic has arrived to PCP office. Patient aware and will pick up on Friday.  [name], [facility] Pharmacist Assistant [phone]

## 2021-03-14 ENCOUNTER — Ambulatory Visit (INDEPENDENT_AMBULATORY_CARE_PROVIDER_SITE_OTHER): Payer: Medicare HMO | Admitting: Physician Assistant

## 2021-03-14 ENCOUNTER — Other Ambulatory Visit: Payer: Self-pay | Admitting: Nurse Practitioner

## 2021-03-14 ENCOUNTER — Other Ambulatory Visit: Payer: Self-pay

## 2021-03-14 ENCOUNTER — Telehealth: Payer: Self-pay

## 2021-03-14 DIAGNOSIS — Z9889 Other specified postprocedural states: Secondary | ICD-10-CM

## 2021-03-14 DIAGNOSIS — M10071 Idiopathic gout, right ankle and foot: Secondary | ICD-10-CM

## 2021-03-14 MED ORDER — COLCHICINE 0.6 MG PO TABS: ORAL_TABLET | ORAL | 0 refills | Status: DC

## 2021-03-14 NOTE — Telephone Encounter (Signed)
Patient notified that samples of xarelto are ready for pickup

## 2021-03-19 ENCOUNTER — Encounter: Payer: Self-pay | Admitting: Internal Medicine

## 2021-03-19 ENCOUNTER — Encounter (HOSPITAL_COMMUNITY): Payer: Self-pay | Admitting: *Deleted

## 2021-03-19 ENCOUNTER — Telehealth: Payer: Medicare HMO

## 2021-03-19 ENCOUNTER — Ambulatory Visit (INDEPENDENT_AMBULATORY_CARE_PROVIDER_SITE_OTHER): Payer: Medicare HMO

## 2021-03-19 DIAGNOSIS — Z794 Long term (current) use of insulin: Secondary | ICD-10-CM

## 2021-03-19 DIAGNOSIS — Z6836 Body mass index (BMI) 36.0-36.9, adult: Secondary | ICD-10-CM

## 2021-03-19 DIAGNOSIS — I129 Hypertensive chronic kidney disease with stage 1 through stage 4 chronic kidney disease, or unspecified chronic kidney disease: Secondary | ICD-10-CM

## 2021-03-19 DIAGNOSIS — F419 Anxiety disorder, unspecified: Secondary | ICD-10-CM

## 2021-03-19 DIAGNOSIS — M793 Panniculitis, unspecified: Secondary | ICD-10-CM

## 2021-03-20 NOTE — Patient Instructions (Signed)
Visit Information  Thank you for taking time to visit with me today. Please don't hesitate to contact me if I can be of assistance to you before our next scheduled telephone appointment.  Following are the goals we discussed today:  (Copy and paste patient goals from clinical care plan here)  Our next appointment is by telephone on 06/12/21 at 12noon  Please call the care guide team at [phone] if you need to cancel or reschedule your appointment.   If you are experiencing a Mental Health or [place] or need someone to talk to, please call [phone]-TALK (toll free, 24 hour hotline)   Patient verbalizes understanding of instructions and care plan provided today and agrees to view in [place]. Active MyChart status confirmed with patient.    Barb Merino, RN, BSN, CCM Care Management Coordinator [facility]  Direct Phone: [phone]

## 2021-03-20 NOTE — Chronic Care Management (AMB) (Signed)
Chronic Care Management   CCM RN Visit Note  03/19/2021 Name: Teresa Golden MRN: [id number] DOB: July 28, 1945  Subjective: Teresa Golden is a 76 y.o. year old female who is a primary care patient of Glendale Chard, MD. The care management team was consulted for assistance with disease management and care coordination needs.    Engaged with patient by telephone for follow up visit in response to provider referral for case management and/or care coordination services.   Consent to Services:  The patient was given information about Chronic Care Management services, agreed to services, and gave verbal consent prior to initiation of services.  Please see initial visit note for detailed documentation.   Patient agreed to services and verbal consent obtained.   Assessment: Review of patient past medical history, allergies, medications, health status, including review of consultants reports, laboratory and other test data, was performed as part of comprehensive evaluation and provision of chronic care management services.   SDOH (Social Determinants of Health) assessments and interventions performed:  Yes, no acute needs   CCM Care Plan  Allergies  Allergen Reactions   Meloxicam Other (See Comments)    Fever; muscle aches; "flu-like" symptoms "Allergic," per [facility]   Other Other (See Comments)    "Rose fever and hay fever"    Outpatient Encounter Medications as of 03/19/2021  Medication Sig   acetaminophen (TYLENOL) 325 MG tablet Take 2 tablets (650 mg total) by mouth every 6 (six) hours as needed for moderate pain or mild pain.   amLODipine (NORVASC) 5 MG tablet Take 1 tablet by mouth once daily   B Complex-C (B-COMPLEX WITH VITAMIN C) tablet Take 1 tablet by mouth daily.   Biotin 10000 MCG TABS Take 10,000 mcg by mouth daily.   Cholecalciferol 5000 units TABS Take 5,000 Units by mouth daily.   colchicine 0.6 MG tablet Take 1 tablet by mouth for gout attack.   Cranberry-Vitamin C 250-60 MG  CAPS Take 1 capsule by mouth daily.   diclofenac Sodium (VOLTAREN) 1 % GEL APPLY 2 GRAMS TO AFFECTED AREA(S) 4 TIMES DAILY AS NEEDED (Patient taking differently: Apply 2-4 g topically every 6 (six) hours as needed (to affected areas).)   gabapentin (NEURONTIN) 300 MG capsule Take 600 mg by mouth at bedtime.   glucose blood (ONETOUCH VERIO) test strip USE TO CHECK BLOOD SUGAR   TWO TIMES A DAY AS         INSTRUCTED   hydrochlorothiazide (HYDRODIURIL) 25 MG tablet Take 1 tablet (25 mg total) by mouth daily.   Lancets (ONETOUCH DELICA PLUS [id number]) MISC USE TO CHECK BLOOD SUGARS  TWO TIMES A DAY AS         INSTRUCTED   metoprolol succinate (TOPROL-XL) 25 MG 24 hr tablet TAKE 1 TABLET AT BEDTIME   (DISCONTINUE COREG)   Multiple Vitamins-Minerals (MULTIVITAMIN WITH MINERALS) tablet Take 1 tablet by mouth daily. Women's 50+   polyethylene glycol (MIRALAX / GLYCOLAX) 17 g packet Take 17 g by mouth daily.   rivaroxaban (XARELTO) 20 MG TABS tablet Take 1 tablet (20 mg total) by mouth daily with supper.   Semaglutide, 1 MG/DOSE, (OZEMPIC, 1 MG/DOSE,) 4 MG/3ML SOPN Inject 1 mg into the skin once a week.   Semaglutide, 1 MG/DOSE, (OZEMPIC, 1 MG/DOSE,) 4 MG/3ML SOPN Inject 1 mg into the skin once a week.   simvastatin (ZOCOR) 10 MG tablet Take 1 tablet (10 mg total) by mouth daily. (Patient taking differently: Take 10 mg by mouth at bedtime.)  SYNTHROID 112 MCG tablet Take 112 mcg by mouth daily before breakfast.   No facility-administered encounter medications on file as of 03/19/2021.    Patient Active Problem List   Diagnosis Date Noted   Infected wound 11/02/2020   Pressure injury of skin 10/22/2020   Wound infection after surgery 10/12/2020   Normocytic anemia 10/12/2020   AKI (acute kidney injury) ([place]) 10/12/2020   Obesity (BMI 30-39.9) 10/12/2020   Sepsis ([place]) 10/12/2020   Type 2 diabetes mellitus with stage 3 chronic kidney disease, with long-term current use of insulin ([place]) 11/29/2019    Panniculitis 09/30/2019   Back pain 09/30/2019   Hepatoma ([place]) 01/21/2019   Other cirrhosis of liver ([place]) 07/05/2018   Hypertensive nephropathy 07/05/2018   Chronic renal disease, stage III ([place]) 07/05/2018   Chronic left shoulder pain 07/05/2018   Hypothyroidism 02/06/2018   Hepatitis C virus infection cured after antiviral drug therapy 02/04/2018   Osteoarthritis of right knee 07/30/2017   S/P laparoscopic sleeve gastrectomy July 2018 08/26/2016   Preop cardiovascular exam 07/30/2016   Dyspnea on exertion 07/30/2016   Bilateral lower extremity edema 07/30/2016    Conditions to be addressed/monitored: DM, CKD III, Hypertensive Nephropathy, Class 2 severe obesity, Anxiety, Panniculitis, localized swelling of lower extremity  Care Plan : RN Care Manager Plan of Care  Updates made by Lynne Logan, RN since 03/19/2021 12:00 AM     Problem: No plan of care established for management of chronic disease states (DM, CKD III, Hypertensive Nephropathy, Class 2 severe obesity, Anxiety, Panniculitis, localized swelling of lower extremity)   Priority: High     Long-Range Goal: Development of plan for management of chronic disease management for DM, CKD III, Hypertensive Nephropathy, Class 2 severe obesity, Anxiety, Panniculitis, localized swelling of lower extremity   Start Date: 12/31/2020  Expected End Date: 12/31/2021  Recent Progress: On track  Priority: High  Note:   Current Barriers:  Knowledge Deficits related to plan of care for management of DM, CKD III, Hypertensive Nephropathy, Class 2 severe obesity, Anxiety, Panniculitis, localized swelling of lower extremity Chronic Disease Management support and education needs related to DM, CKD III, Hypertensive Nephropathy, Class 2 severe obesity, Anxiety, Panniculitis, localized swelling of lower extremity  RNCM Clinical Goal(s):  Patient will verbalize basic understanding of  DM, CKD III, Hypertensive Nephropathy, Class 2 severe  obesity, Anxiety, Panniculitis, localized swelling of lower extremity disease process and self health management plan   demonstrate Improved health management independence   continue to work with RN Care Manager to address care management and care coordination needs related to  DM, CKD III, Hypertensive Nephropathy, Class 2 severe obesity, Anxiety, Panniculitis, localized swelling of lower extremity will demonstrate ongoing self health care management ability    through collaboration with RN Care manager, provider, and care team.  Interventions: 1:1 collaboration with primary care provider regarding development and update of comprehensive plan of care as evidenced by provider attestation and co-signature Inter-disciplinary care team collaboration (see longitudinal plan of care) Evaluation of current treatment plan related to  self management and patient's adherence to plan as established by provider  Hypertension Interventions:  (Status:  Goal on track:  Yes.) Long Term Goal Last practice recorded BP readings:  BP Readings from Last 3 Encounters:  02/27/21 130/70  01/16/21 (!) 154/80  12/25/20 120/68  Most recent eGFR/CrCl:  Lab Results  Component Value Date   EGFR 52 (L) 02/27/2021    No components found for: CRCL  Evaluation of current treatment  plan related to hypertension self management and patient's adherence to plan as established by provider Reviewed medications with patient and discussed importance of compliance Counseled on the importance of exercise goals with target of 150 minutes per week Advised patient, providing education and rationale, to monitor blood pressure daily and record, calling PCP for findings outside established parameters Provided education on prescribed diet low Sodium   Discussed plans with patient for ongoing care management follow up and provided patient with direct contact information for care management team  Abdominal Wound Interventions: (Status: Goal  Met.) Short Term Goal  Evaluation of current treatment plan related to  Abdominal wound , self-management and patient's adherence to plan as established by provider Reviewed and discussed recent follow up with on 03/14/21 with [facility] Determined patient's wounds following her Panniculectomy have healed, all wounds are closed and patient will resume normal activity with the exception of  avoiding submersion in bath or pool for at least a couple more months, otherwise no restrictions at this time.  No dressing changes needed.  Scar cream if desired.  Discussed patient has resumed exercising at her local gym weekly and will work her way to up to desired routine as tolerated   Discussed plans with patient for ongoing care management follow up and provided patient with direct contact information for care management team;   Diabetes Interventions:  (Status:  Goal on track:  Yes.) Long Term Goal Assessed patient's understanding of A1c goal: <6.5% Review of patient status, including review of consultant's reports, relevant laboratory and other test results, and medications completed. Reviewed medications with patient and discussed importance of medication adherence Educated patient on dietary and exercise recommendations; daily glycemic control FBS 80-130, <180 after meals;15'15' rule Discussed plans with patient for ongoing care management follow up and provided patient with direct contact information for care management team Lab Results  Component Value Date   HGBA1C 6.5 (H) 02/27/2021    DVT of Lower Extremity: (Status: Goal on track:  Yes) Long Term Goal  Evaluation of current treatment plan related to  DVT of LE , self-management and patient's adherence to plan as established by provider Review of patient status, including review of consultant's reports, relevant laboratory and other test results, and medications completed. Reviewed medications with patient and discussed  importance of medication adherence Determined Venous doppler indicates thrombus is smaller but unresolved, patient will continue current regimen Discussed PCP referral for Hematology to manage Acute deep vein thrombosis (DVT) of left peroneal vein Reviewed scheduled/upcoming provider appointment including: initial follow up with Dr. Narda Rutherford on 03/28/21 _0 :00 AM Encouraged patient to discuss physical activity restriction concerns with provider during visit  Re-educated on signs/symptoms of PE and when to seek medical treatment Discussed plans with patient for ongoing care management follow up and provided patient with direct contact information for care management team  Patient Goals/Self-Care Activities: Take all medications as prescribed Attend all scheduled provider appointments Call pharmacy for medication refills 3-7 days in advance of running out of medications Perform all self care activities independently  Perform IADL's (shopping, preparing meals, housekeeping, managing finances) independently Call provider office for new concerns or questions  drink 6 to 8 glasses of water each day manage portion size keep a blood pressure log take blood pressure log to all doctor appointments call doctor for signs and symptoms of high blood pressure report new symptoms to your doctor  Follow Up Plan:  Telephone follow up appointment with care management team member scheduled  for:  06/12/21      Plan:Telephone follow up appointment with care management team member scheduled for:  06/12/21  Barb Merino, RN, BSN, CCM Care Management Coordinator [facility]  Direct Phone: [phone]

## 2021-03-26 DIAGNOSIS — N182 Chronic kidney disease, stage 2 (mild): Secondary | ICD-10-CM | POA: Diagnosis not present

## 2021-03-26 DIAGNOSIS — E1122 Type 2 diabetes mellitus with diabetic chronic kidney disease: Secondary | ICD-10-CM

## 2021-03-26 DIAGNOSIS — I129 Hypertensive chronic kidney disease with stage 1 through stage 4 chronic kidney disease, or unspecified chronic kidney disease: Secondary | ICD-10-CM

## 2021-03-26 DIAGNOSIS — Z794 Long term (current) use of insulin: Secondary | ICD-10-CM

## 2021-03-28 ENCOUNTER — Ambulatory Visit: Payer: Self-pay

## 2021-03-28 ENCOUNTER — Inpatient Hospital Stay: Payer: Medicare HMO | Attending: Hematology and Oncology | Admitting: Hematology and Oncology

## 2021-03-28 ENCOUNTER — Telehealth: Payer: Medicare HMO

## 2021-03-28 ENCOUNTER — Ambulatory Visit (INDEPENDENT_AMBULATORY_CARE_PROVIDER_SITE_OTHER): Payer: Medicare HMO

## 2021-03-28 ENCOUNTER — Other Ambulatory Visit: Payer: Self-pay

## 2021-03-28 ENCOUNTER — Inpatient Hospital Stay: Payer: Medicare HMO

## 2021-03-28 VITALS — BP 159/68 | HR 78 | Temp 98.5°F | Resp 17 | Ht 65.0 in | Wt 199.9 lb

## 2021-03-28 DIAGNOSIS — I82552 Chronic embolism and thrombosis of left peroneal vein: Secondary | ICD-10-CM

## 2021-03-28 DIAGNOSIS — Z8619 Personal history of other infectious and parasitic diseases: Secondary | ICD-10-CM | POA: Diagnosis not present

## 2021-03-28 DIAGNOSIS — I129 Hypertensive chronic kidney disease with stage 1 through stage 4 chronic kidney disease, or unspecified chronic kidney disease: Secondary | ICD-10-CM

## 2021-03-28 DIAGNOSIS — E1122 Type 2 diabetes mellitus with diabetic chronic kidney disease: Secondary | ICD-10-CM

## 2021-03-28 DIAGNOSIS — Z794 Long term (current) use of insulin: Secondary | ICD-10-CM

## 2021-03-28 DIAGNOSIS — F419 Anxiety disorder, unspecified: Secondary | ICD-10-CM

## 2021-03-28 DIAGNOSIS — K219 Gastro-esophageal reflux disease without esophagitis: Secondary | ICD-10-CM | POA: Insufficient documentation

## 2021-03-28 DIAGNOSIS — E039 Hypothyroidism, unspecified: Secondary | ICD-10-CM | POA: Diagnosis not present

## 2021-03-28 DIAGNOSIS — Z823 Family history of stroke: Secondary | ICD-10-CM | POA: Insufficient documentation

## 2021-03-28 DIAGNOSIS — Z9049 Acquired absence of other specified parts of digestive tract: Secondary | ICD-10-CM | POA: Insufficient documentation

## 2021-03-28 DIAGNOSIS — N183 Chronic kidney disease, stage 3 unspecified: Secondary | ICD-10-CM | POA: Diagnosis not present

## 2021-03-28 DIAGNOSIS — Z87891 Personal history of nicotine dependence: Secondary | ICD-10-CM | POA: Insufficient documentation

## 2021-03-28 DIAGNOSIS — Z9884 Bariatric surgery status: Secondary | ICD-10-CM | POA: Diagnosis not present

## 2021-03-28 DIAGNOSIS — Z86718 Personal history of other venous thrombosis and embolism: Secondary | ICD-10-CM | POA: Insufficient documentation

## 2021-03-28 DIAGNOSIS — K7460 Unspecified cirrhosis of liver: Secondary | ICD-10-CM | POA: Diagnosis not present

## 2021-03-28 DIAGNOSIS — N182 Chronic kidney disease, stage 2 (mild): Secondary | ICD-10-CM

## 2021-03-28 DIAGNOSIS — M7989 Other specified soft tissue disorders: Secondary | ICD-10-CM

## 2021-03-28 DIAGNOSIS — Z8249 Family history of ischemic heart disease and other diseases of the circulatory system: Secondary | ICD-10-CM | POA: Insufficient documentation

## 2021-03-28 DIAGNOSIS — Z888 Allergy status to other drugs, medicaments and biological substances status: Secondary | ICD-10-CM | POA: Insufficient documentation

## 2021-03-28 DIAGNOSIS — Z79899 Other long term (current) drug therapy: Secondary | ICD-10-CM | POA: Insufficient documentation

## 2021-03-28 DIAGNOSIS — Z7901 Long term (current) use of anticoagulants: Secondary | ICD-10-CM | POA: Insufficient documentation

## 2021-03-28 DIAGNOSIS — I82452 Acute embolism and thrombosis of left peroneal vein: Secondary | ICD-10-CM

## 2021-03-28 DIAGNOSIS — M793 Panniculitis, unspecified: Secondary | ICD-10-CM

## 2021-03-28 LAB — CBC WITH DIFFERENTIAL (CANCER CENTER ONLY)
Abs Immature Granulocytes: 0.02 10*3/uL (ref 0.00–0.07)
Basophils Absolute: 0.1 10*3/uL (ref 0.0–0.1)
Basophils Relative: 1 %
Eosinophils Absolute: 0.2 10*3/uL (ref 0.0–0.5)
Eosinophils Relative: 2 %
HCT: 35 % — ABNORMAL LOW (ref 36.0–46.0)
Hemoglobin: 11.7 g/dL — ABNORMAL LOW (ref 12.0–15.0)
Immature Granulocytes: 0 %
Lymphocytes Relative: 26 %
Lymphs Abs: 2.2 10*3/uL (ref 0.7–4.0)
MCH: 27.1 pg (ref 26.0–34.0)
MCHC: 33.4 g/dL (ref 30.0–36.0)
MCV: 81 fL (ref 80.0–100.0)
Monocytes Absolute: 0.7 10*3/uL (ref 0.1–1.0)
Monocytes Relative: 9 %
Neutro Abs: 5 10*3/uL (ref 1.7–7.7)
Neutrophils Relative %: 62 %
Platelet Count: 228 10*3/uL (ref 150–400)
RBC: 4.32 MIL/uL (ref 3.87–5.11)
RDW: 15.2 % (ref 11.5–15.5)
WBC Count: 8.2 10*3/uL (ref 4.0–10.5)
nRBC: 0 % (ref 0.0–0.2)

## 2021-03-28 LAB — CMP (CANCER CENTER ONLY)
ALT: 19 U/L (ref 0–44)
AST: 25 U/L (ref 15–41)
Albumin: 4.1 g/dL (ref 3.5–5.0)
Alkaline Phosphatase: 81 U/L (ref 38–126)
Anion gap: 8 (ref 5–15)
BUN: 15 mg/dL (ref 8–23)
CO2: 25 mmol/L (ref 22–32)
Calcium: 9.5 mg/dL (ref 8.9–10.3)
Chloride: 107 mmol/L (ref 98–111)
Creatinine: 1.02 mg/dL — ABNORMAL HIGH (ref 0.44–1.00)
GFR, Estimated: 57 mL/min — ABNORMAL LOW (ref >60)
Glucose, Bld: 173 mg/dL — ABNORMAL HIGH (ref 70–99)
Potassium: 3.9 mmol/L (ref 3.5–5.1)
Sodium: 140 mmol/L (ref 135–145)
Total Bilirubin: 0.5 mg/dL (ref 0.3–1.2)
Total Protein: 7.7 g/dL (ref 6.5–8.1)

## 2021-03-28 LAB — IRON AND IRON BINDING CAPACITY (CC-WL,HP ONLY)
Iron: 48 ug/dL (ref 28–170)
Saturation Ratios: 13 % (ref 10.4–31.8)
TIBC: 360 ug/dL (ref 250–450)
UIBC: 312 ug/dL (ref 148–442)

## 2021-03-28 LAB — FERRITIN: Ferritin: 70 ng/mL (ref 11–307)

## 2021-03-28 LAB — VITAMIN B12: Vitamin B-12: 3108 pg/mL — ABNORMAL HIGH (ref 180–914)

## 2021-03-28 NOTE — Patient Instructions (Signed)
Social Worker Visit Information  Goals we discussed today:  Patient Goals/Self-Care Activities patient will:   - Remain engaged with Consulting civil engineer to address care management needs -Contact SW as needed   Patient verbalizes understanding of instructions and care plan provided today and agrees to view in [place]. Active MyChart status confirmed with patient.    Follow Up Plan:  No scheduled follow up planned at this time. Please contact me as needed.   Daneen Schick, BSW, CDP Social Worker, Certified Dementia Practitioner [facility] [phone]

## 2021-03-28 NOTE — Chronic Care Management (AMB) (Signed)
Chronic Care Management    Social Work Note  03/28/2021 Name: Teresa Golden MRN: [id number] DOB: 1945/10/10  Teresa Golden is a 76 y.o. year old female who is a primary care patient of Glendale Chard, MD. The CCM team was consulted to assist the patient with chronic disease management and/or care coordination needs related to:  DM II, CKD III, HTN .   Engaged with patient by telephone for follow up visit in response to provider referral for social work chronic care management and care coordination services.   Consent to Services:  The patient was given information about Chronic Care Management services, agreed to services, and gave verbal consent prior to initiation of services.  Please see initial visit note for detailed documentation.   Patient agreed to services and consent obtained.   Assessment: Review of patient past medical history, allergies, medications, and health status, including review of relevant consultants reports was performed today as part of a comprehensive evaluation and provision of chronic care management and care coordination services.     SDOH (Social Determinants of Health) assessments and interventions performed:  SDOH Interventions    Flowsheet Row Most Recent Value  SDOH Interventions   Food Insecurity Interventions Intervention Not Indicated  Housing Interventions Intervention Not Indicated  Transportation Interventions Intervention Not Indicated        Advanced Directives Status: Not addressed in this encounter.  CCM Care Plan  Allergies  Allergen Reactions   Meloxicam Other (See Comments)    Fever; muscle aches; "flu-like" symptoms "Allergic," per [facility]   Other Other (See Comments)    "Rose fever and hay fever"    Outpatient Encounter Medications as of 03/28/2021  Medication Sig   acetaminophen (TYLENOL) 325 MG tablet Take 2 tablets (650 mg total) by mouth every 6 (six) hours as needed for moderate pain or mild pain.   amLODipine (NORVASC) 5 MG  tablet Take 1 tablet by mouth once daily   B Complex-C (B-COMPLEX WITH VITAMIN C) tablet Take 1 tablet by mouth daily.   Biotin 10000 MCG TABS Take 10,000 mcg by mouth daily.   Cholecalciferol 5000 units TABS Take 5,000 Units by mouth daily.   colchicine 0.6 MG tablet Take 1 tablet by mouth for gout attack.   Cranberry-Vitamin C 250-60 MG CAPS Take 1 capsule by mouth daily.   diclofenac Sodium (VOLTAREN) 1 % GEL APPLY 2 GRAMS TO AFFECTED AREA(S) 4 TIMES DAILY AS NEEDED (Patient taking differently: Apply 2-4 g topically every 6 (six) hours as needed (to affected areas).)   gabapentin (NEURONTIN) 300 MG capsule Take 600 mg by mouth at bedtime.   glucose blood (ONETOUCH VERIO) test strip USE TO CHECK BLOOD SUGAR   TWO TIMES A DAY AS         INSTRUCTED   hydrochlorothiazide (HYDRODIURIL) 25 MG tablet Take 1 tablet (25 mg total) by mouth daily. (Patient not taking: Reported on 03/28/2021)   Lancets (ONETOUCH DELICA PLUS [id number]) MISC USE TO CHECK BLOOD SUGARS  TWO TIMES A DAY AS         INSTRUCTED   metoprolol succinate (TOPROL-XL) 25 MG 24 hr tablet TAKE 1 TABLET AT BEDTIME   (DISCONTINUE COREG)   Multiple Vitamins-Minerals (MULTIVITAMIN WITH MINERALS) tablet Take 1 tablet by mouth daily. Women's 50+   polyethylene glycol (MIRALAX / GLYCOLAX) 17 g packet Take 17 g by mouth daily.   rivaroxaban (XARELTO) 20 MG TABS tablet Take 1 tablet (20 mg total) by mouth daily with supper.   Semaglutide,  1 MG/DOSE, (OZEMPIC, 1 MG/DOSE,) 4 MG/3ML SOPN Inject 1 mg into the skin once a week.   Semaglutide, 1 MG/DOSE, (OZEMPIC, 1 MG/DOSE,) 4 MG/3ML SOPN Inject 1 mg into the skin once a week.   simvastatin (ZOCOR) 10 MG tablet Take 1 tablet (10 mg total) by mouth daily. (Patient taking differently: Take 10 mg by mouth at bedtime.)   SYNTHROID 112 MCG tablet Take 112 mcg by mouth daily before breakfast.   [DISCONTINUED] SEMGLEE, YFGN, 100 UNIT/ML Pen Inject 9 Units into the skin at bedtime.   No facility-administered  encounter medications on file as of 03/28/2021.    Patient Active Problem List   Diagnosis Date Noted   Infected wound 11/02/2020   Pressure injury of skin 10/22/2020   Wound infection after surgery 10/12/2020   Normocytic anemia 10/12/2020   AKI (acute kidney injury) ([place]) 10/12/2020   Obesity (BMI 30-39.9) 10/12/2020   Sepsis ([place]) 10/12/2020   Type 2 diabetes mellitus with stage 3 chronic kidney disease, with long-term current use of insulin ([place]) 11/29/2019   Panniculitis 09/30/2019   Back pain 09/30/2019   Hepatoma ([place]) 01/21/2019   Other cirrhosis of liver ([place]) 07/05/2018   Hypertensive nephropathy 07/05/2018   Chronic renal disease, stage III ([place]) 07/05/2018   Chronic left shoulder pain 07/05/2018   Hypothyroidism 02/06/2018   Hepatitis C virus infection cured after antiviral drug therapy 02/04/2018   Osteoarthritis of right knee 07/30/2017   S/P laparoscopic sleeve gastrectomy July 2018 08/26/2016   Preop cardiovascular exam 07/30/2016   Dyspnea on exertion 07/30/2016   Bilateral lower extremity edema 07/30/2016    Conditions to be addressed/monitored: HTN, DMII, and CKD Stage III  Care Plan : Social Work Care Plan  Updates made by Daneen Schick since 03/28/2021 12:00 AM  Completed 03/28/2021   Problem: Quality of Life (General Plan of Care) Resolved 03/28/2021     Goal: Quality of Life Maintained Completed 03/28/2021  Start Date: 03/28/2021  Priority: High  Note:   Current Barriers:  Chronic disease management support and education needs related to HTN, DM, and CKD Stage III   Housing barriers related to costs of living  Education officer, museum Clinical Goal(s):  patient will work with SW to identify and address any acute and/or chronic care coordination needs related to the self health management of HTN, DM, and CKD Stage III    SW Interventions:  Inter-disciplinary care team collaboration (see longitudinal plan of care) Collaboration with Glendale Chard, MD regarding  development and update of comprehensive plan of care as evidenced by provider attestation and co-signature Collaboration with a team member of patients primary care providers office indicating patient requested a call from me Successful outbound call placed to the patient Discussed patient is doing well at this time with no acute SDoH needs Reviewed patient remains on the wait list with the housing authority for section 8 housing - patient remains able to pay current rent but is concerned it may increase in the future Determined the patient has applied for LIEAP (low income energy assistance program) to help offset utility costs - patient has yet to receive determination if she will be approved Discussed plan for patient to contact SW as needed with future resource needs  Patient Goals/Self-Care Activities patient will:   -  Remain engaged with RN Care Manager to address care management needs -Contact SW as needed        Follow Up Plan:  No scheduled SW follow up at this time. The patient will  remain engaged with Consulting civil engineer and contact SW as needed.      Daneen Schick, BSW, CDP Social Worker, Certified Dementia Practitioner [place] [facility] [place] [facility] [phone]

## 2021-03-28 NOTE — Progress Notes (Signed)
[place] [phone]   Fax:([phone]) [place] NOTE  Patient Care Team: Glendale Chard, MD as PCP - General (Internal Medicine) Elouise Munroe, MD as PCP - Cardiology (Cardiology) Rex Kras Claudette Stapler, RN as Case Manager [facility]ford Knife, [facility] (Pharmacist)  Hematological/Oncological History # Left Peroneal Vein DVT 11/26/2020: [place] LE showed age indeterminate deep vein thrombosis involving the left peroneal veins. Started on Xarelto therapy.  03/07/2021: repeat bilateral LE US showed age indeterminate deep vein thrombosis involving the left peroneal veins. Thrombus appears to be isolated to one of the paired peroneal veins in the mid calf. 03/28/2021: establish care with Dr. Lorenso Courier   CHIEF COMPLAINTS/PURPOSE OF CONSULTATION:  "Left Peroneal Vein DVT "  HISTORY OF PRESENTING ILLNESS:  Teresa Golden 76 y.o. female with medical history significant for CKD, endometriosis, GERD, Hep C s/p Harvoni, cirrhosis, hypothyroidism, and DM type II who presents for evaluation of a lower extremity DVT.   On review of the previous records Teresa Golden was initially diagnosed with a lower extremity DVT on 11/26/2020.  Ultrasound of the lower extremity showed a deep vein thrombosis involving the left peroneal veins.  Subsequently on 03/07/2021 the patient to repeat lower extremity ultrasound which showed an age-indeterminate deep vein thrombosis involving the left peroneal veins.  The thrombosis appeared to be isolated to one of the paired peroneal veins in the mid calf.  Due to concerns for these findings the patient was referred to hematology for further evaluation and management.  On exam today Teresa Golden reports that she has undergone a gastric bypass surgery in 2019.  She notes that it was a sleeve gastrectomy.  She notes that she did require a panniculectomy and subsequently had issues with wound infection after that procedure.  She notes that was "in and out to [place]  long" with treatment of this wound.  It was shortly after 1 of these hospitalizations that she developed this lower extremity DVT.  She was started on Xarelto therapy which she has been tolerating well.  She has not been having any issues with bleeding, bruising, or dark stools.  She also notes continued swelling of her left lower extremity.  On further discussion the patient notes that she was a former smoker having quit 35 years ago.  She notes that she does drink an occasional glass of wine.  Her family history is remarkable for a stroke in her mother and heart disease and stroke in her father.  She did have 1 child who is unfortunately deceased.  She does not currently have a spouse and lives in town by herself.  She notes that while hospitalized she was using SCDs but was not particularly mobile at the time of the clot diagnosis.  She otherwise denies any fevers, chills, sweats, nausea, vomiting or diarrhea.  A full 10 point ROS is listed below.  MEDICAL HISTORY:  Past Medical History:  Diagnosis Date   Arthritis    Chronic kidney disease    self reports ckd stage 3    Depression    Diabetes mellitus, type II, insulin dependent ([place])    With neurologic complications. 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    Hypertensive nephropathy 07/05/2018  Hypothyroidism    Hypothyroidism 02/06/2018   Morbid obesity with BMI of 50.0-59.9, adult ([place])    Scoliosis     SURGICAL HISTORY: Past Surgical History:  Procedure Laterality Date   ABDOMINAL HYSTERECTOMY     uterus   CHOLECYSTECTOMY     DIAGNOSTIC LAPAROSCOPY     DILATION AND CURETTAGE OF UTERUS     EYE SURGERY     cataract extraction bilateral    INCISION AND DRAINAGE OF WOUND N/A 10/15/2020    Procedure: IRRIGATION AND DEBRIDEMENT WOUND;  Surgeon: Wallace Going, DO;  Location: [facility];  Service: Plastics;  Laterality: N/A;  60 min   JOINT REPLACEMENT     Left knee   KNEE ARTHROPLASTY Right 07/30/2017   Procedure: RIGHT TOTAL KNEE ARTHROPLASTY WITH COMPUTER NAVIGATION;  Surgeon: Rod Can, MD;  Location: [facility];  Service: Orthopedics;  Laterality: Right;  Needs RNFA   LAPAROSCOPIC GASTRIC BANDING     and reversal   LAPAROSCOPIC GASTRIC SLEEVE RESECTION N/A 08/26/2016   Procedure: LAPAROSCOPIC GASTRIC SLEEVE RESECTION WITH UPPER ENOD;  Surgeon: Johnathan Hausen, MD;  Location: [facility];  Service: General;  Laterality: N/A;   PANNICULECTOMY N/A 09/26/2020   Procedure: PANNICULECTOMY;  Surgeon: Wallace Going, DO;  Location: [facility];  Service: Plastics;  Laterality: N/A;   TONSILLECTOMY     TRANSTHORACIC ECHOCARDIOGRAM  11/2013   EF 65-70%. Normal diastolic Fxn.  Normal Valves.   UMBILICAL HERNIA REPAIR N/A 09/26/2020   Procedure: HERNIA REPAIR UMBILICAL ADULT;  Surgeon: Georganna Skeans, MD;  Location: [facility];  Service: General;  Laterality: N/A;    SOCIAL HISTORY: Social History   Socioeconomic History   Marital status: Single    Spouse name: Not on file   Number of children: Not on file   Years of education: Not on file   Highest education level: Not on file  Occupational History   Occupation: retired  Tobacco Use   Smoking status: Former    Packs/day: 0.25    Years: 10.00    Pack years: 2.50    Types: Cigarettes   Smokeless tobacco: Never   Tobacco comments:    quit 20 years ago  Vaping Use   Vaping Use: Never used  Substance and Sexual Activity   Alcohol use: Not Currently    Alcohol/week: 1.0 standard drink    Types: 1 Glasses of wine per week    Comment: occasional   Drug use: No   Sexual activity: Yes  Other Topics Concern   Not on file  Social History Narrative   Not on file   Social Determinants of Health   Financial Resource Strain: Low  Risk    Difficulty of Paying Living Expenses: Not hard at all  Food Insecurity: No Food Insecurity   Worried About Charity fundraiser in the Last Year: Never true   [place] in the Last Year: Never true  Transportation Needs: No Transportation Needs   Lack of Transportation (Medical): No   Lack of Transportation (Non-Medical): No  Physical Activity: Insufficiently Active   Days of Exercise per Week: 3 days   Minutes of Exercise per Session: 40 min  Stress: No Stress Concern Present   Feeling of Stress : Not at all  Social Connections: Not on file  Intimate Partner Violence: Not on file    FAMILY HISTORY: Family History  Problem Relation Age of Onset   Heart attack Mother    Heart failure Mother    Stroke  Father     ALLERGIES:  is allergic to meloxicam and other.  MEDICATIONS:  Current Outpatient Medications  Medication Sig Dispense Refill   acetaminophen (TYLENOL) 325 MG tablet Take 2 tablets (650 mg total) by mouth every 6 (six) hours as needed for moderate pain or mild pain.     amLODipine (NORVASC) 5 MG tablet Take 1 tablet by mouth once daily 90 tablet 0   B Complex-C (B-COMPLEX WITH VITAMIN C) tablet Take 1 tablet by mouth daily.     Biotin 10000 MCG TABS Take 10,000 mcg by mouth daily.     Cholecalciferol 5000 units TABS Take 5,000 Units by mouth daily.     colchicine 0.6 MG tablet Take 1 tablet by mouth for gout attack. 5 tablet 0   Cranberry-Vitamin C 250-60 MG CAPS Take 1 capsule by mouth daily.     diclofenac Sodium (VOLTAREN) 1 % GEL APPLY 2 GRAMS TO AFFECTED AREA(S) 4 TIMES DAILY AS NEEDED (Patient taking differently: Apply 2-4 g topically every 6 (six) hours as needed (to affected areas).) 200 g 2   gabapentin (NEURONTIN) 300 MG capsule Take 600 mg by mouth at bedtime.     glucose blood (ONETOUCH VERIO) test strip USE TO CHECK BLOOD SUGAR   TWO TIMES A DAY AS         INSTRUCTED 200 strip 3   hydrochlorothiazide (HYDRODIURIL) 25 MG tablet Take 1 tablet (25  mg total) by mouth daily. (Patient not taking: Reported on 03/28/2021) 30 tablet 0   Lancets (ONETOUCH DELICA PLUS [id number]) MISC USE TO CHECK BLOOD SUGARS  TWO TIMES A DAY AS         INSTRUCTED 200 each 3   metoprolol succinate (TOPROL-XL) 25 MG 24 hr tablet TAKE 1 TABLET AT BEDTIME   (DISCONTINUE COREG) 90 tablet 1   Multiple Vitamins-Minerals (MULTIVITAMIN WITH MINERALS) tablet Take 1 tablet by mouth daily. Women's 50+     polyethylene glycol (MIRALAX / GLYCOLAX) 17 g packet Take 17 g by mouth daily. 14 each 0   rivaroxaban (XARELTO) 20 MG TABS tablet Take 1 tablet (20 mg total) by mouth daily with supper. 30 tablet 1   Semaglutide, 1 MG/DOSE, (OZEMPIC, 1 MG/DOSE,) 4 MG/3ML SOPN Inject 1 mg into the skin once a week. 9 mL 1   Semaglutide, 1 MG/DOSE, (OZEMPIC, 1 MG/DOSE,) 4 MG/3ML SOPN Inject 1 mg into the skin once a week. 9 mL 1   simvastatin (ZOCOR) 10 MG tablet Take 1 tablet (10 mg total) by mouth daily. (Patient taking differently: Take 10 mg by mouth at bedtime.) 90 tablet 1   SYNTHROID 112 MCG tablet Take 112 mcg by mouth daily before breakfast.     No current facility-administered medications for this visit.    REVIEW OF SYSTEMS:   Constitutional: ( - ) fevers, ( - )  chills , ( - ) night sweats Eyes: ( - ) blurriness of vision, ( - ) double vision, ( - ) watery eyes Ears, nose, mouth, throat, and face: ( - ) mucositis, ( - ) sore throat Respiratory: ( - ) cough, ( - ) dyspnea, ( - ) wheezes Cardiovascular: ( - ) palpitation, ( - ) chest discomfort, ( - ) lower extremity swelling Gastrointestinal:  ( - ) nausea, ( - ) heartburn, ( - ) change in bowel habits Skin: ( - ) abnormal skin rashes Lymphatics: ( - ) new lymphadenopathy, ( - ) easy bruising Neurological: ( - ) numbness, ( - ) tingling, ( - )  new weaknesses Behavioral/Psych: ( - ) mood change, ( - ) new changes  All other systems were reviewed with the patient and are negative.  PHYSICAL EXAMINATION:  Vitals:   03/28/21  0853  BP: (!) 159/68  Pulse: 78  Resp: 17  Temp: 98.5 F (36.9 C)  SpO2: 100%   Filed Weights   03/28/21 0853  Weight: 199 lb 14.4 oz (90.7 kg)    GENERAL: well appearing elderly African-American female in NAD  SKIN: skin color, texture, turgor are normal, no rashes or significant lesions EYES: conjunctiva are pink and non-injected, sclera clear LUNGS: clear to auscultation and percussion with normal breathing effort HEART: regular rate & rhythm and no murmurs and no lower extremity edema Musculoskeletal: no cyanosis of digits and no clubbing  PSYCH: alert & oriented x 3, fluent speech NEURO: no focal motor/sensory deficits  LABORATORY DATA:  I have reviewed the data as listed CBC Latest Ref Rng & Units 03/28/2021 02/27/2021 11/06/2020  WBC 4.0 - 10.5 K/uL 8.2 9.7 5.1  Hemoglobin 12.0 - 15.0 g/dL 11.7(L) 11.5 7.6(L)  Hematocrit 36.0 - 46.0 % 35.0(L) 32.8(L) 23.3(L)  Platelets 150 - 400 K/uL 228 208 211    CMP Latest Ref Rng & Units 03/28/2021 02/27/2021 12/25/2020  Glucose 70 - 99 mg/dL 173(H) 120(H) 165(H)  BUN 8 - 23 mg/dL 15 21 14   Creatinine 0.44 - 1.00 mg/dL 1.02(H) 1.11(H) 1.23(H)  Sodium 135 - 145 mmol/L 140 139 144  Potassium 3.5 - 5.1 mmol/L 3.9 3.9 3.9  Chloride 98 - 111 mmol/L 107 105 107(H)  CO2 22 - 32 mmol/L 25 22 23   Calcium 8.9 - 10.3 mg/dL 9.5 9.9 9.5  Total Protein 6.5 - 8.1 g/dL 7.7 7.5 -  Total Bilirubin 0.3 - 1.2 mg/dL 0.5 0.4 -  Alkaline Phos 38 - 126 U/L 81 86 -  AST 15 - 41 U/L 25 27 -  ALT 0 - 44 U/L 19 24 -     ASSESSMENT & PLAN Teresa Golden 76 y.o. female with medical history significant for CKD, endometriosis, GERD, Hep C s/p Harvoni, cirrhosis, hypothyroidism, and DM type II who presents for evaluation of a lower extremity DVT.   After review of the labs, review of the records, and discussion with the patient the patients findings are most consistent with a provoked lower extremity DVT.  She has been on Xarelto therapy but unfortunately still has  what appears to be residual clot in her left lower extremity.  I am concerned that given her gastric bypass surgery she is not appropriately absorbing Xarelto and is not getting adequate anticoagulation.  As such I would recommend that she switch to Coumadin therapy and continue this for a full 6 months duration.  Given the provoked nature of this VTE I think would be reasonable for limited duration of anticoagulation.  # Provoked Lower Extremity DVT -- At this time findings are consistent with a provoked VTE in the setting of prolonged hospitalization.  She was using SCDs but still developed a blood clot. --Patient is currently on Xarelto therapy, however she is undergone a gastric sleeve which may inhibit appropriate absorption of Xarelto. --Recommendation in this case is to transition to Coumadin therapy instead.  Unfortunately we do not have a Coumadin clinic here at the [facility] will need to work with the patient's primary care provider in order to get her enrolled in a Coumadin clinic --Recommended regulation for a duration of at least 6 months time.  After 6 months we could consider discontinuation of therapy given the provoked nature of this clot --Return to clinic in approximately 6 months time to discuss discontinuation of anticoagulation.  Orders Placed This Encounter  Procedures   CBC with Differential ([facility] Only)    Standing Status:   Future    Number of Occurrences:   1    Standing Expiration Date:   03/28/2022   CMP ([facility] only)    Standing Status:   Future    Number of Occurrences:   1    Standing Expiration Date:   03/28/2022   Ferritin    Standing Status:   Future    Number of Occurrences:   1    Standing Expiration Date:   03/28/2022   Iron and Iron Binding Capacity ([facility] only)    Standing Status:   Future    Number of Occurrences:   1    Standing Expiration Date:   03/28/2022   Vitamin B12    Standing Status:   Future    Number of  Occurrences:   1    Standing Expiration Date:   03/28/2022    All questions were answered. The patient knows to call the clinic with any problems, questions or concerns.  A total of more than 60 minutes were spent on this encounter with face-to-face time and non-face-to-face time, including preparing to see the patient, ordering tests and/or medications, counseling the patient and coordination of care as outlined above.   Ledell Peoples, MD Department of Hematology/Oncology [facility] Phone: [phone] Pager: [phone] Email: [email or web address]  03/30/2021 11:21 AM

## 2021-03-29 ENCOUNTER — Ambulatory Visit: Payer: Self-pay

## 2021-03-29 ENCOUNTER — Telehealth: Payer: Self-pay | Admitting: Hematology and Oncology

## 2021-03-29 DIAGNOSIS — N182 Chronic kidney disease, stage 2 (mild): Secondary | ICD-10-CM

## 2021-03-29 DIAGNOSIS — I82452 Acute embolism and thrombosis of left peroneal vein: Secondary | ICD-10-CM

## 2021-03-29 DIAGNOSIS — N183 Chronic kidney disease, stage 3 unspecified: Secondary | ICD-10-CM

## 2021-03-29 DIAGNOSIS — E1122 Type 2 diabetes mellitus with diabetic chronic kidney disease: Secondary | ICD-10-CM

## 2021-03-29 NOTE — Chronic Care Management (AMB) (Signed)
Chronic Care Management   CCM RN Visit Note  03/28/2021 Name: Teresa Golden MRN: [id number] DOB: Jun 14, 1945  Subjective: Teresa Golden is a 76 y.o. year old female who is a primary care patient of Glendale Chard, MD. The care management team was consulted for assistance with disease management and care coordination needs.    Engaged with patient by telephone for follow up visit in response to provider referral for case management and/or care coordination services.   Consent to Services:  The patient was given information about Chronic Care Management services, agreed to services, and gave verbal consent prior to initiation of services.  Please see initial visit note for detailed documentation.   Patient agreed to services and verbal consent obtained.   Assessment: Review of patient past medical history, allergies, medications, health status, including review of consultants reports, laboratory and other test data, was performed as part of comprehensive evaluation and provision of chronic care management services.   SDOH (Social Determinants of Health) assessments and interventions performed:  Yes, no acute challenges   CCM Care Plan  Allergies  Allergen Reactions   Meloxicam Other (See Comments)    Fever; muscle aches; "flu-like" symptoms "Allergic," per [facility]   Other Other (See Comments)    "Rose fever and hay fever"    Outpatient Encounter Medications as of 03/28/2021  Medication Sig   acetaminophen (TYLENOL) 325 MG tablet Take 2 tablets (650 mg total) by mouth every 6 (six) hours as needed for moderate pain or mild pain.   amLODipine (NORVASC) 5 MG tablet Take 1 tablet by mouth once daily   B Complex-C (B-COMPLEX WITH VITAMIN C) tablet Take 1 tablet by mouth daily.   Biotin 10000 MCG TABS Take 10,000 mcg by mouth daily.   Cholecalciferol 5000 units TABS Take 5,000 Units by mouth daily.   colchicine 0.6 MG tablet Take 1 tablet by mouth for gout attack.   Cranberry-Vitamin C 250-60  MG CAPS Take 1 capsule by mouth daily.   diclofenac Sodium (VOLTAREN) 1 % GEL APPLY 2 GRAMS TO AFFECTED AREA(S) 4 TIMES DAILY AS NEEDED (Patient taking differently: Apply 2-4 g topically every 6 (six) hours as needed (to affected areas).)   gabapentin (NEURONTIN) 300 MG capsule Take 600 mg by mouth at bedtime.   glucose blood (ONETOUCH VERIO) test strip USE TO CHECK BLOOD SUGAR   TWO TIMES A DAY AS         INSTRUCTED   hydrochlorothiazide (HYDRODIURIL) 25 MG tablet Take 1 tablet (25 mg total) by mouth daily. (Patient not taking: Reported on 03/28/2021)   Lancets (ONETOUCH DELICA PLUS [id number]) MISC USE TO CHECK BLOOD SUGARS  TWO TIMES A DAY AS         INSTRUCTED   metoprolol succinate (TOPROL-XL) 25 MG 24 hr tablet TAKE 1 TABLET AT BEDTIME   (DISCONTINUE COREG)   Multiple Vitamins-Minerals (MULTIVITAMIN WITH MINERALS) tablet Take 1 tablet by mouth daily. Women's 50+   polyethylene glycol (MIRALAX / GLYCOLAX) 17 g packet Take 17 g by mouth daily.   rivaroxaban (XARELTO) 20 MG TABS tablet Take 1 tablet (20 mg total) by mouth daily with supper.   Semaglutide, 1 MG/DOSE, (OZEMPIC, 1 MG/DOSE,) 4 MG/3ML SOPN Inject 1 mg into the skin once a week.   Semaglutide, 1 MG/DOSE, (OZEMPIC, 1 MG/DOSE,) 4 MG/3ML SOPN Inject 1 mg into the skin once a week.   simvastatin (ZOCOR) 10 MG tablet Take 1 tablet (10 mg total) by mouth daily. (Patient taking differently: Take 10  mg by mouth at bedtime.)   SYNTHROID 112 MCG tablet Take 112 mcg by mouth daily before breakfast.   No facility-administered encounter medications on file as of 03/28/2021.    Patient Active Problem List   Diagnosis Date Noted   Infected wound 11/02/2020   Pressure injury of skin 10/22/2020   Wound infection after surgery 10/12/2020   Normocytic anemia 10/12/2020   AKI (acute kidney injury) ([place]) 10/12/2020   Obesity (BMI 30-39.9) 10/12/2020   Sepsis ([place]) 10/12/2020   Type 2 diabetes mellitus with stage 3 chronic kidney disease, with  long-term current use of insulin ([place]) 11/29/2019   Panniculitis 09/30/2019   Back pain 09/30/2019   Hepatoma ([place]) 01/21/2019   Other cirrhosis of liver ([place]) 07/05/2018   Hypertensive nephropathy 07/05/2018   Chronic renal disease, stage III ([place]) 07/05/2018   Chronic left shoulder pain 07/05/2018   Hypothyroidism 02/06/2018   Hepatitis C virus infection cured after antiviral drug therapy 02/04/2018   Osteoarthritis of right knee 07/30/2017   S/P laparoscopic sleeve gastrectomy July 2018 08/26/2016   Preop cardiovascular exam 07/30/2016   Dyspnea on exertion 07/30/2016   Bilateral lower extremity edema 07/30/2016    Conditions to be addressed/monitored: DM, CKD III, Hypertensive Nephropathy, Class 2 severe obesity, Anxiety, Panniculitis, localized swelling of lower extremity  Care Plan : RN Care Manager Plan of Care  Updates made by Lynne Logan, RN since 03/28/2021 12:00 AM     Problem: No plan of care established for management of chronic disease states (DM, CKD III, Hypertensive Nephropathy, Class 2 severe obesity, Anxiety, Panniculitis, localized swelling of lower extremity)   Priority: High     Long-Range Goal: Development of plan for management of chronic disease management for DM, CKD III, Hypertensive Nephropathy, Class 2 severe obesity, Anxiety, Panniculitis, localized swelling of lower extremity   Start Date: 12/31/2020  Expected End Date: 12/31/2021  Recent Progress: On track  Priority: High  Note:   Current Barriers:  Knowledge Deficits related to plan of care for management of DM, CKD III, Hypertensive Nephropathy, Class 2 severe obesity, Anxiety, Panniculitis, localized swelling of lower extremity Chronic Disease Management support and education needs related to DM, CKD III, Hypertensive Nephropathy, Class 2 severe obesity, Anxiety, Panniculitis, localized swelling of lower extremity  RNCM Clinical Goal(s):  Patient will verbalize basic understanding of  DM, CKD  III, Hypertensive Nephropathy, Class 2 severe obesity, Anxiety, Panniculitis, localized swelling of lower extremity disease process and self health management plan   demonstrate Improved health management independence   continue to work with RN Care Manager to address care management and care coordination needs related to  DM, CKD III, Hypertensive Nephropathy, Class 2 severe obesity, Anxiety, Panniculitis, localized swelling of lower extremity will demonstrate ongoing self health care management ability    through collaboration with RN Care manager, provider, and care team.  Interventions: 1:1 collaboration with primary care provider regarding development and update of comprehensive plan of care as evidenced by provider attestation and co-signature Inter-disciplinary care team collaboration (see longitudinal plan of care) Evaluation of current treatment plan related to  self management and patient's adherence to plan as established by provider  Hypertension Interventions:  (Status:  Goal on track:  Yes.) Long Term Goal Last practice recorded BP readings:  BP Readings from Last 3 Encounters:  02/27/21 130/70  01/16/21 (!) 154/80  12/25/20 120/68  Most recent eGFR/CrCl:  Lab Results  Component Value Date   EGFR 52 (L) 02/27/2021    No components found  for: CRCL  Evaluation of current treatment plan related to hypertension self management and patient's adherence to plan as established by provider Reviewed medications with patient and discussed importance of compliance Counseled on the importance of exercise goals with target of 150 minutes per week Advised patient, providing education and rationale, to monitor blood pressure daily and record, calling PCP for findings outside established parameters Provided education on prescribed diet low Sodium   Discussed plans with patient for ongoing care management follow up and provided patient with direct contact information for care management  team  Abdominal Wound Interventions: (Status: Goal Met.) Short Term Goal  Evaluation of current treatment plan related to  Abdominal wound , self-management and patient's adherence to plan as established by provider Reviewed and discussed recent follow up with on 03/14/21 with [facility] Determined patient's wounds following her Panniculectomy have healed, all wounds are closed and patient will resume normal activity with the exception of  avoiding submersion in bath or pool for at least a couple more months, otherwise no restrictions at this time.  No dressing changes needed.  Scar cream if desired.  Discussed patient has resumed exercising at her local gym weekly and will work her way to up to desired routine as tolerated   Discussed plans with patient for ongoing care management follow up and provided patient with direct contact information for care management team;   Diabetes Interventions:  (Status:  Goal on track:  Yes.) Long Term Goal Assessed patient's understanding of A1c goal: <6.5% Review of patient status, including review of consultant's reports, relevant laboratory and other test results, and medications completed. Reviewed medications with patient and discussed importance of medication adherence Educated patient on dietary and exercise recommendations; daily glycemic control FBS 80-130, <180 after meals;15'15' rule Discussed plans with patient for ongoing care management follow up and provided patient with direct contact information for care management team Lab Results  Component Value Date   HGBA1C 6.5 (H) 02/27/2021    DVT of Lower Extremity: (Status: Goal on track:  Yes) Long Term Goal  Evaluation of current treatment plan related to  DVT of LE , self-management and patient's adherence to plan as established by provider Received inbound call from patient stating she follow up with Dr. Lorenso Courier regarding treatment management of her DVT Determined Dr. Lorenso Courier  will change patient's anticoagulant therapy from Xarelto to Warfarin and this will be managed by the Coumadin clinic at [facility] patient on the need for weekly PT/INR checks, educated on what foods contain Vitamin K that may interfere with PT/INR levels  Determined patient has enough Xarelto on hand until she switches over to Warfarin Collaboration with Dr. Baird Cancer, PCP regarding Dr. Libby Maw treatment plan per Ms. Sprong Discussed plans with patient for ongoing care management follow up and provided patient with direct contact information for care management team  Patient Goals/Self-Care Activities: Take all medications as prescribed Attend all scheduled provider appointments Call pharmacy for medication refills 3-7 days in advance of running out of medications Perform all self care activities independently  Perform IADL's (shopping, preparing meals, housekeeping, managing finances) independently Call provider office for new concerns or questions  drink 6 to 8 glasses of water each day manage portion size keep a blood pressure log take blood pressure log to all doctor appointments call doctor for signs and symptoms of high blood pressure report new symptoms to your doctor  Follow Up Plan:  Telephone follow up appointment with care management team member scheduled  for:  06/12/21     Plan:Telephone follow up appointment with care management team member scheduled for:  06/12/21  Barb Merino, RN, BSN, CCM Care Management Coordinator [place] [facility]  Direct Phone: [phone]

## 2021-03-29 NOTE — Chronic Care Management (AMB) (Signed)
Chronic Care Management    Social Work Note  03/29/2021 Name: Teresa Golden MRN: [id number] DOB: 1945-08-15  Teresa Golden is a 76 y.o. year old female who is a primary care patient of Glendale Chard, MD. The CCM team was consulted to assist the patient with chronic disease management and/or care coordination needs related to:  DM II, CKD III, HTN .   Engaged with patient by telephone for follow up visit in response to provider referral for social work chronic care management and care coordination services.   Consent to Services:  The patient was given information about Chronic Care Management services, agreed to services, and gave verbal consent prior to initiation of services.  Please see initial visit note for detailed documentation.   Patient agreed to services and consent obtained.   Assessment: Review of patient past medical history, allergies, medications, and health status, including review of relevant consultants reports was performed today as part of a comprehensive evaluation and provision of chronic care management and care coordination services.     SDOH (Social Determinants of Health) assessments and interventions performed:    Advanced Directives Status: Not addressed in this encounter.  CCM Care Plan  Allergies  Allergen Reactions   Meloxicam Other (See Comments)    Fever; muscle aches; "flu-like" symptoms "Allergic," per [facility]   Other Other (See Comments)    "Rose fever and hay fever"    Outpatient Encounter Medications as of 03/29/2021  Medication Sig   acetaminophen (TYLENOL) 325 MG tablet Take 2 tablets (650 mg total) by mouth every 6 (six) hours as needed for moderate pain or mild pain.   amLODipine (NORVASC) 5 MG tablet Take 1 tablet by mouth once daily   B Complex-C (B-COMPLEX WITH VITAMIN C) tablet Take 1 tablet by mouth daily.   Biotin 10000 MCG TABS Take 10,000 mcg by mouth daily.   Cholecalciferol 5000 units TABS Take 5,000 Units by mouth daily.    colchicine 0.6 MG tablet Take 1 tablet by mouth for gout attack.   Cranberry-Vitamin C 250-60 MG CAPS Take 1 capsule by mouth daily.   diclofenac Sodium (VOLTAREN) 1 % GEL APPLY 2 GRAMS TO AFFECTED AREA(S) 4 TIMES DAILY AS NEEDED (Patient taking differently: Apply 2-4 g topically every 6 (six) hours as needed (to affected areas).)   gabapentin (NEURONTIN) 300 MG capsule Take 600 mg by mouth at bedtime.   glucose blood (ONETOUCH VERIO) test strip USE TO CHECK BLOOD SUGAR   TWO TIMES A DAY AS         INSTRUCTED   hydrochlorothiazide (HYDRODIURIL) 25 MG tablet Take 1 tablet (25 mg total) by mouth daily. (Patient not taking: Reported on 03/28/2021)   Lancets (ONETOUCH DELICA PLUS [id number]) MISC USE TO CHECK BLOOD SUGARS  TWO TIMES A DAY AS         INSTRUCTED   metoprolol succinate (TOPROL-XL) 25 MG 24 hr tablet TAKE 1 TABLET AT BEDTIME   (DISCONTINUE COREG)   Multiple Vitamins-Minerals (MULTIVITAMIN WITH MINERALS) tablet Take 1 tablet by mouth daily. Women's 50+   polyethylene glycol (MIRALAX / GLYCOLAX) 17 g packet Take 17 g by mouth daily.   rivaroxaban (XARELTO) 20 MG TABS tablet Take 1 tablet (20 mg total) by mouth daily with supper.   Semaglutide, 1 MG/DOSE, (OZEMPIC, 1 MG/DOSE,) 4 MG/3ML SOPN Inject 1 mg into the skin once a week.   Semaglutide, 1 MG/DOSE, (OZEMPIC, 1 MG/DOSE,) 4 MG/3ML SOPN Inject 1 mg into the skin once a week.  simvastatin (ZOCOR) 10 MG tablet Take 1 tablet (10 mg total) by mouth daily. (Patient taking differently: Take 10 mg by mouth at bedtime.)   SYNTHROID 112 MCG tablet Take 112 mcg by mouth daily before breakfast.   [DISCONTINUED] SEMGLEE, YFGN, 100 UNIT/ML Pen Inject 9 Units into the skin at bedtime.   No facility-administered encounter medications on file as of 03/29/2021.    Patient Active Problem List   Diagnosis Date Noted   Infected wound 11/02/2020   Pressure injury of skin 10/22/2020   Wound infection after surgery 10/12/2020   Normocytic anemia 10/12/2020    AKI (acute kidney injury) ([place]) 10/12/2020   Obesity (BMI 30-39.9) 10/12/2020   Sepsis ([place]) 10/12/2020   Type 2 diabetes mellitus with stage 3 chronic kidney disease, with long-term current use of insulin ([place]) 11/29/2019   Panniculitis 09/30/2019   Back pain 09/30/2019   Hepatoma ([place]) 01/21/2019   Other cirrhosis of liver ([place]) 07/05/2018   Hypertensive nephropathy 07/05/2018   Chronic renal disease, stage III ([place]) 07/05/2018   Chronic left shoulder pain 07/05/2018   Hypothyroidism 02/06/2018   Hepatitis C virus infection cured after antiviral drug therapy 02/04/2018   Osteoarthritis of right knee 07/30/2017   S/P laparoscopic sleeve gastrectomy July 2018 08/26/2016   Preop cardiovascular exam 07/30/2016   Dyspnea on exertion 07/30/2016   Bilateral lower extremity edema 07/30/2016    Conditions to be addressed/monitored: HTN, DMII, CKD Stage III, and DVT  Care Plan : Social Work Care Plan  Updates made by Daneen Schick since 03/29/2021 12:00 AM     Problem: Quality of Life (General Plan of Care)      Goal: Quality of Life Maintained   Start Date: 03/28/2021  Priority: High  Note:   Current Barriers:  Chronic disease management support and education needs related to HTN, DM, and CKD Stage III   Housing barriers related to costs of living  Education officer, museum Clinical Goal(s):  patient will work with SW to identify and address any acute and/or chronic care coordination needs related to the self health management of HTN, DM, and CKD Stage III    SW Interventions:  Inter-disciplinary care team collaboration (see longitudinal plan of care) Collaboration with Glendale Chard, MD regarding development and update of comprehensive plan of care as evidenced by provider attestation and co-signature Voice message received from the patient requesting a return call  Successful outbound call placed to the patient to assist with care coordination needs Discussed the patient is frustrated with  inability to take Aleve due to interactions with Xarelto - patients DVT continues to be painful and she does get relief with Tylenol Determined the patient was seen by Dr. Lorenso Courier on 2.2 for above concerns and was advised pending lab results patient may be referred to the Coumadin clinic in order to discontinue use of Xarelto Patient reports frustration and anxiety surrounding situation which is impacting her ability to sleep through the night; patient interested in trying a new medication called "Dwana Curd" in an attempt to improve her sleep pattern Discussed plans for SW to collaborate with patients primary provider Dr. Baird Cancer to advise of above patient concerns and to request a team member contact the patient to discuss concerns with sleep Patient requests SW follow up on status of referral to the Coumadin clinic indicating Dr. Lorenso Courier advised the patient once lab results are received he would collaborate with Dr. Baird Cancer based on referral needs Advised the patient SW would request provider follow up regarding referral, informed patient  she would need to allow her providers time to collaborate with each other and should not expect a call from Dr. Baird Cancer office until sometime next week. Patient stated understanding Collaboration with patients primary care team including Dr. Baird Cancer as well as [place] to advise of interventions and plan  Patient Goals/Self-Care Activities patient will:  -Follow up with primary provider regarding concern with sleep schedule -Follow provider recommendation regarding medication management in relation to [place] engaged with [place] to address care management needs -Contact SW as needed prior to next scheduled call  Follow up Plan: SW will follow up with the patient over the next two weeks        Follow Up Plan: SW will follow up with patient by phone over the next two weeks      Daneen Schick, BSW, CDP Social Worker,  Certified Dementia Practitioner [place] / [facility] [phone]

## 2021-03-29 NOTE — Patient Instructions (Signed)
Visit Information  Thank you for taking time to visit with me today. Please don't hesitate to contact me if I can be of assistance to you before our next scheduled telephone appointment.  Following are the goals we discussed today:  (Copy and paste patient goals from clinical care plan here)  Our next appointment is by telephone on 06/12/21 at 12 N  Please call the care guide team at [phone] if you need to cancel or reschedule your appointment.   If you are experiencing a Mental Health or [place] or need someone to talk to, please call 1-800-273-TALK (toll free, 24 hour hotline)   Patient verbalizes understanding of instructions and care plan provided today and agrees to view in [place]. Active MyChart status confirmed with patient.    Barb Merino, RN, BSN, CCM Care Management Coordinator [place] [facility]  Direct Phone: [phone]

## 2021-03-29 NOTE — Patient Instructions (Signed)
Social Worker Visit Information  Goals we discussed today:  Patient Goals/Self-Care Activities patient will:  -Follow up with primary provider regarding concern with sleep schedule -Follow provider recommendation regarding medication management in relation to [place] engaged with RN Care Manager to address care management needs -Contact SW as needed prior to next scheduled call   Patient verbalizes understanding of instructions and care plan provided today and agrees to view in [place]. Active MyChart status confirmed with patient.    Follow Up Plan: SW will follow up with patient by phone over the next two weeks   Daneen Schick, BSW, CDP Social Worker, Certified Dementia Practitioner [place] / [place] [facility] [phone]

## 2021-03-29 NOTE — Telephone Encounter (Signed)
Scheduled per 2/2 los, message has been left with pt

## 2021-03-30 ENCOUNTER — Other Ambulatory Visit: Payer: Self-pay | Admitting: Internal Medicine

## 2021-04-01 ENCOUNTER — Telehealth: Payer: Self-pay

## 2021-04-01 ENCOUNTER — Telehealth: Payer: Self-pay | Admitting: *Deleted

## 2021-04-01 NOTE — Telephone Encounter (Signed)
Received call from pt regarding transitioning from Xarelto to coumadin and having her coumadin monitored at her PCP office. Reviewed that plan with her again and advised that should expect to hear from her PCP office this week about managing her Coumadin.  After several explanations. Pt understood theplna. Encouraged her to contact her PCP office in the morning to discuss with them.

## 2021-04-01 NOTE — Telephone Encounter (Signed)
Patient called very concerned requesting a call from you. She said she seen the hematologist last week and he is recommending her getting registered with the Coumadin Clinic. YL,RMA

## 2021-04-02 ENCOUNTER — Telehealth: Payer: Self-pay | Admitting: Internal Medicine

## 2021-04-02 MED ORDER — WARFARIN SODIUM 5 MG PO TABS: 5.0000 mg | ORAL_TABLET | Freq: Every day | ORAL | 0 refills | Status: DC

## 2021-04-02 NOTE — Telephone Encounter (Signed)
Tiana from [facility] requesting the patient get established with the coumadin clinic. She states it if for DVT and her hematologist wants her to start coumadin. She says the patient is expecting a call.

## 2021-04-02 NOTE — Telephone Encounter (Signed)
Routed to pharmacy team for assistance with coumadin clinic appointments/new start

## 2021-04-02 NOTE — Telephone Encounter (Signed)
Appointment set for new coumadin patient Friday Feb 10 at 10:30 am.  Advised patient to start warfarin 5 mg daily (today if possible) and continue Xarelto through Thursday evening.  Patient voiced understanding.

## 2021-04-04 ENCOUNTER — Other Ambulatory Visit: Payer: Self-pay

## 2021-04-04 DIAGNOSIS — M25471 Effusion, right ankle: Secondary | ICD-10-CM

## 2021-04-04 DIAGNOSIS — M25472 Effusion, left ankle: Secondary | ICD-10-CM

## 2021-04-04 DIAGNOSIS — I82452 Acute embolism and thrombosis of left peroneal vein: Secondary | ICD-10-CM

## 2021-04-05 ENCOUNTER — Ambulatory Visit (INDEPENDENT_AMBULATORY_CARE_PROVIDER_SITE_OTHER): Payer: Medicare HMO

## 2021-04-05 ENCOUNTER — Other Ambulatory Visit: Payer: Self-pay

## 2021-04-05 DIAGNOSIS — Z7901 Long term (current) use of anticoagulants: Secondary | ICD-10-CM | POA: Insufficient documentation

## 2021-04-05 DIAGNOSIS — I824Y9 Acute embolism and thrombosis of unspecified deep veins of unspecified proximal lower extremity: Secondary | ICD-10-CM | POA: Insufficient documentation

## 2021-04-05 LAB — POCT INR: INR: 1.5 — AB (ref 2.0–3.0)

## 2021-04-05 NOTE — Patient Instructions (Signed)
Take 1 tablet Daily, except 0.5 tablet on Wednesday.  INR 1 week.  A full discussion of the nature of anticoagulants has been carried out.  A benefit risk analysis has been presented to the patient, so that they understand the justification for choosing anticoagulation at this time. The need for frequent and regular monitoring, precise dosage adjustment and compliance is stressed.  Side effects of potential bleeding are discussed.  The patient should avoid any OTC items containing aspirin or ibuprofen, and should avoid great swings in general diet.  Avoid alcohol consumption.  Call if any signs of abnormal bleeding.  [phone]

## 2021-04-08 ENCOUNTER — Telehealth: Payer: Medicare HMO

## 2021-04-09 ENCOUNTER — Ambulatory Visit (INDEPENDENT_AMBULATORY_CARE_PROVIDER_SITE_OTHER): Payer: Medicare HMO | Admitting: Addiction (Substance Use Disorder)

## 2021-04-09 ENCOUNTER — Ambulatory Visit: Payer: Medicare HMO

## 2021-04-09 ENCOUNTER — Other Ambulatory Visit: Payer: Self-pay

## 2021-04-09 DIAGNOSIS — F4323 Adjustment disorder with mixed anxiety and depressed mood: Secondary | ICD-10-CM

## 2021-04-09 DIAGNOSIS — I82452 Acute embolism and thrombosis of left peroneal vein: Secondary | ICD-10-CM

## 2021-04-09 DIAGNOSIS — E1122 Type 2 diabetes mellitus with diabetic chronic kidney disease: Secondary | ICD-10-CM

## 2021-04-09 DIAGNOSIS — N183 Chronic kidney disease, stage 3 unspecified: Secondary | ICD-10-CM

## 2021-04-09 NOTE — Patient Instructions (Signed)
Social Worker Visit Information  Goals we discussed today:  Patient Goals/Self-Care Activities patient will:  -Continue to engage with Coumadin Clinic to assess INR levels -Engage with RN Care Manager as needed to address care management needs   Patient verbalizes understanding of instructions and care plan provided today and agrees to view in [place]. Active MyChart status confirmed with patient.    Follow Up Plan:  No SW follow up planned at this time. Please contact me as needed.   Daneen Schick, BSW, CDP Social Worker, Certified Dementia Practitioner [place] / [place] [facility] [phone]

## 2021-04-09 NOTE — Progress Notes (Signed)
°      [facility] Counselor/Therapist Progress Note  Patient ID: LARAYNE BAXLEY, MRN: [id number],    Date: 04/09/2021   Time Spent:  72mins  Treatment Type: Individual Therapy  Reported Symptoms: lonely, frustrated  Mental Status Exam:  Appearance:   Casual and Well Groomed     Behavior:  Appropriate and Sharing  Motor:  Normal  Speech/Language:   Clear and Coherent and Normal Rate  Affect:  Appropriate  Mood:  irritable and sad  Thought process:  normal  Thought content:    Rumination  Sensory/Perceptual disturbances:    WNL  Orientation:  x4  Attention:  Good  Concentration:  Good  Memory:  WNL  Fund of knowledge:   Good  Insight:    Good  Judgment:   Good  Impulse Control:  Fair   Risk Assessment: Danger to Self:  No Self-injurious Behavior: No Danger to Others: No Duty to Warn:no Physical Aggression / Violence:No  Access to Firearms a concern: No  Gang Involvement:No    Subjective: Client reported still slowly getting better and being frustrated with how slow it is going. Client trying to slow down and taking things easy when she needs to rest and therapist used RPT with client to remind her of coping skills she can use that are not going to overwhelm her physically. Client reported having pulled back when she got surgery and just not over expending her energy on others who dont show up for her. Client processed her feelings about a past relationship and her feelings and therapist used MI & CBT with client to help her process her let down with herbert who wouldn't stand behind his word and honor her by prioritizing her  in his life at all. Therapist assessed for stability and client denied SI/HI/AVH.   Interventions: Cognitive Behavioral Therapy, Motivational Interviewing, and RPT    Diagnosis:   ICD-10-CM   1. Adjustment disorder with mixed anxiety and depressed mood  F43.23       Plan of Care:  Client is to return to therapy with therapist every 1-2 weeks as  needed to process traumas/ frustrations in a safe space, to be re-evaluated in 3 months.  Client is to practice mindfulness AEB daily meditation and body scans or as needed when flooded by emotion/pain.  Client is to learn/practice DBT wise mind & radical acceptance. Client is to practice self-compassion AEB being gentle with themselves, utilizing self-care techniques daily or as needed when grieving something in the moment.  Client is to process grief/pain of their loss in a somatic body-felt sense way: ie using mindfulness, brainspotting, or trauma release as a method for releasing body pain/tension caused by grief.  Barnie Del, LCSW, LCAS, CCTP, CCS-I, BSP

## 2021-04-09 NOTE — Chronic Care Management (AMB) (Signed)
Chronic Care Management    Social Work Note  04/09/2021 Name: MAKAYIA DUPLESSIS MRN: [id number] DOB: 1945/10/01  ASYIA HORNUNG is a 76 y.o. year old female who is a primary care patient of Glendale Chard, MD. The CCM team was consulted to assist the patient with chronic disease management and/or care coordination needs related to:  DM II, CKD III, HTN .   Engaged with patient by telephone for follow up visit in response to provider referral for social work chronic care management and care coordination services.   Consent to Services:  The patient was given information about Chronic Care Management services, agreed to services, and gave verbal consent prior to initiation of services.  Please see initial visit note for detailed documentation.   Patient agreed to services and consent obtained.   Assessment: Review of patient past medical history, allergies, medications, and health status, including review of relevant consultants reports was performed today as part of a comprehensive evaluation and provision of chronic care management and care coordination services.     SDOH (Social Determinants of Health) assessments and interventions performed:    Advanced Directives Status: Not addressed in this encounter.  CCM Care Plan  Allergies  Allergen Reactions   Meloxicam Other (See Comments)    Fever; muscle aches; "flu-like" symptoms "Allergic," per [facility]   Other Other (See Comments)    "Rose fever and hay fever"    Outpatient Encounter Medications as of 04/09/2021  Medication Sig   acetaminophen (TYLENOL) 325 MG tablet Take 2 tablets (650 mg total) by mouth every 6 (six) hours as needed for moderate pain or mild pain.   amLODipine (NORVASC) 5 MG tablet Take 1 tablet by mouth once daily   B Complex-C (B-COMPLEX WITH VITAMIN C) tablet Take 1 tablet by mouth daily.   Biotin 10000 MCG TABS Take 10,000 mcg by mouth daily.   Cholecalciferol 5000 units TABS Take 5,000 Units by mouth daily.    colchicine 0.6 MG tablet Take 1 tablet by mouth for gout attack.   Cranberry-Vitamin C 250-60 MG CAPS Take 1 capsule by mouth daily.   diclofenac Sodium (VOLTAREN) 1 % GEL APPLY 2 GRAMS TO AFFECTED AREA(S) 4 TIMES DAILY AS NEEDED (Patient taking differently: Apply 2-4 g topically every 6 (six) hours as needed (to affected areas).)   gabapentin (NEURONTIN) 300 MG capsule Take 600 mg by mouth at bedtime.   glucose blood (ONETOUCH VERIO) test strip USE TO CHECK BLOOD SUGAR   TWO TIMES A DAY AS         INSTRUCTED   hydrochlorothiazide (HYDRODIURIL) 25 MG tablet Take 1 tablet (25 mg total) by mouth daily. (Patient not taking: Reported on 03/28/2021)   Lancets (ONETOUCH DELICA PLUS [id number]) MISC USE TO CHECK BLOOD SUGARS  TWO TIMES A DAY AS         INSTRUCTED   metoprolol succinate (TOPROL-XL) 25 MG 24 hr tablet TAKE 1 TABLET AT BEDTIME   (DISCONTINUE COREG)   Multiple Vitamins-Minerals (MULTIVITAMIN WITH MINERALS) tablet Take 1 tablet by mouth daily. Women's 50+   polyethylene glycol (MIRALAX / GLYCOLAX) 17 g packet Take 17 g by mouth daily.   Semaglutide, 1 MG/DOSE, (OZEMPIC, 1 MG/DOSE,) 4 MG/3ML SOPN Inject 1 mg into the skin once a week.   Semaglutide, 1 MG/DOSE, (OZEMPIC, 1 MG/DOSE,) 4 MG/3ML SOPN Inject 1 mg into the skin once a week.   simvastatin (ZOCOR) 10 MG tablet Take 1 tablet (10 mg total) by mouth daily. (Patient taking differently: Take  10 mg by mouth at bedtime.)   SYNTHROID 112 MCG tablet Take 112 mcg by mouth daily before breakfast.   warfarin (COUMADIN) 5 MG tablet Take 1 tablet (5 mg total) by mouth daily.   [DISCONTINUED] SEMGLEE, YFGN, 100 UNIT/ML Pen Inject 9 Units into the skin at bedtime.   No facility-administered encounter medications on file as of 04/09/2021.    Patient Active Problem List   Diagnosis Date Noted   Long term (current) use of anticoagulants 04/05/2021   Acute venous embolism and thrombosis of deep vessels of proximal lower extremity ([place]) 04/05/2021    Infected wound 11/02/2020   Pressure injury of skin 10/22/2020   Wound infection after surgery 10/12/2020   Normocytic anemia 10/12/2020   AKI (acute kidney injury) ([place]) 10/12/2020   Obesity (BMI 30-39.9) 10/12/2020   Sepsis ([place]) 10/12/2020   Type 2 diabetes mellitus with stage 3 chronic kidney disease, with long-term current use of insulin ([place]) 11/29/2019   Panniculitis 09/30/2019   Back pain 09/30/2019   Hepatoma ([place]) 01/21/2019   Other cirrhosis of liver ([place]) 07/05/2018   Hypertensive nephropathy 07/05/2018   Chronic renal disease, stage III ([place]) 07/05/2018   Chronic left shoulder pain 07/05/2018   Hypothyroidism 02/06/2018   Hepatitis C virus infection cured after antiviral drug therapy 02/04/2018   Osteoarthritis of right knee 07/30/2017   S/P laparoscopic sleeve gastrectomy July 2018 08/26/2016   Preop cardiovascular exam 07/30/2016   Dyspnea on exertion 07/30/2016   Bilateral lower extremity edema 07/30/2016    Conditions to be addressed/monitored: HTN, DMII, and CKD Stage III  Care Plan : Social Work Care Plan  Updates made by Daneen Schick since 04/09/2021 12:00 AM  Completed 04/09/2021   Problem: Quality of Life (General Plan of Care) Resolved 04/09/2021     Goal: Quality of Life Maintained Completed 04/09/2021  Start Date: 03/28/2021  This Visit's Progress: On track  Priority: High  Note:   Current Barriers:  Chronic disease management support and education needs related to HTN, DM, and CKD Stage III   Housing barriers related to costs of living  Social Worker Clinical Goal(s):  patient will work with SW to identify and address any acute and/or chronic care coordination needs related to the self health management of HTN, DM, and CKD Stage III    SW Interventions:  Inter-disciplinary care team collaboration (see longitudinal plan of care) Collaboration with Glendale Chard, MD regarding development and update of comprehensive plan of care as evidenced by  provider attestation and co-signature Telephonic visit completed with the patient to assess goal progression Discussed the patient is now active with the [facility] and will return Thursday for updated INR levels Determined the patient is currently taking Coumadin daily with a 1/2 pill on Wednesday Patient reports relief with switching from Xarelto to Coumadin as her acid reflux has subsided. Patient also reports she was advised she can utilize Aleve for severe pain but not daily Reviewed patients PAP has been approved for Ozempic the remainder of the year Determined the patient has stopped taking Vitamin B12 after reviewing recent results on her MyChart and seeing high levels Assessed for acute care coordination needs - no challenges identified at this time Discussed plans for SW to communicate above information to [place] with [place]   Patient Goals/Self-Care Activities patient will:  -Continue to engage with Coumadin Clinic to assess INR levels -Engage with RN Care Manager as needed to address care management needs  Follow Up Plan:  No SW follow up planned at this time. Patient will remain engaged with RN Care Manager to address care management needs.      Daneen Schick, BSW, CDP Social Worker, Certified Dementia Practitioner [place] [facility] [phone]

## 2021-04-10 ENCOUNTER — Telehealth: Payer: Self-pay

## 2021-04-10 NOTE — Telephone Encounter (Signed)
I spoke to patient regarding her illness and told her that I would discuss with her 2/16 at her INR visit.  She verbalized understanding

## 2021-04-11 ENCOUNTER — Ambulatory Visit (INDEPENDENT_AMBULATORY_CARE_PROVIDER_SITE_OTHER): Payer: Medicare HMO

## 2021-04-11 ENCOUNTER — Other Ambulatory Visit: Payer: Self-pay | Admitting: Internal Medicine

## 2021-04-11 ENCOUNTER — Other Ambulatory Visit: Payer: Self-pay

## 2021-04-11 DIAGNOSIS — M25471 Effusion, right ankle: Secondary | ICD-10-CM | POA: Diagnosis not present

## 2021-04-11 DIAGNOSIS — Z7901 Long term (current) use of anticoagulants: Secondary | ICD-10-CM | POA: Diagnosis not present

## 2021-04-11 DIAGNOSIS — I82452 Acute embolism and thrombosis of left peroneal vein: Secondary | ICD-10-CM

## 2021-04-11 DIAGNOSIS — M25472 Effusion, left ankle: Secondary | ICD-10-CM

## 2021-04-11 LAB — POCT INR: INR: 1.6 — AB (ref 2.0–3.0)

## 2021-04-11 NOTE — Patient Instructions (Signed)
TAKE 2 TABLETS TONIGHT AND THEN INCREASE TO 1 tablet Daily, except 1.5 tablets on Wednesday.  INR 1 week.

## 2021-04-16 ENCOUNTER — Telehealth: Payer: Self-pay

## 2021-04-16 NOTE — Chronic Care Management (AMB) (Signed)
Chronic Care Management Pharmacy Assistant   Name: Teresa Golden  MRN: [id number] DOB: 1945/09/18   Reason for Encounter: Disease State/ Diabetes  Recent office visits:  04-09-2021 Daneen Schick (CCM)  03-29-2021 Daneen Schick (CCM)  03-28-2021 Daneen Schick (CCM)  03-28-2021 Lynne Logan, RN (CCM)  03-19-2021 Rex Kras, Claudette Stapler, RN (CCM)  02-27-2021 Bary Castilla, NP. Hema= 32.8. Glucose= 120, Creatinine= 1.11, eGFR= 52. A1C= 6.5. Uric acid= 8.3. VAS [place] lower extremity venous ordered.   01-30-2021 Daneen Schick (CCM)  Recent consult visits:  04-11-2021 Frederik Schmidt, RN (Cardiology). Anti-coag visit.  04-11-2021 Pedro Earls, MD ([place]). Follow up visit for pain in right wrist.  04-09-2021 Barnie Del, LCSW (Behavioral health). Unable to view encounter.  04-05-2021  Frederik Schmidt, RN (Cardiology). Anti-coag visit.  03-28-2021 Orson Slick, MD (Hematology/Oncology). Hemo= 11.7, HCT= 35.0. Glucose= 173, Creatinine= 1.02, GFR= 57. Vitamin B12= 3108. Follow up in 6 months.  03-14-2021 Corena Herter, PA-C (Plastic Surgery). Follow up on panniculectomy.   03-14-2021 Rosemary Holms, MD (Foot/Ankle). Follow up.  02-28-2021 Barnie Del, LCSW (Behavioral health). Unable to view encounter.  02-12-2021 Corena Herter, PA-C (Plastic surgery). "Area of hypergranulation was chemically cauterized here in clinic with silver nitrate.  Applied Xeroform followed by 4 x 4 gauze and secured with Medipore tape over cautery site as well as over her small, superficial nondraining wound right side of incision.  01-29-2021 Barnie Del, LCSW (Behavioral health). Unable to view encounter.  01-21-2021 Corena Herter, PA-C (Plastic surgery). Follow up.  Hospital visits:  Medication Reconciliation was completed by comparing discharge summary, patients EMR and Pharmacy list, and upon discussion with patient.   Admitted to the hospital on 11-02-2020 due to wound  infection after surgery. Discharge date was 11-07-2020. Discharged from [facility]?Medications Started at [facility]charge:?? Colchine 0.6 mg daily for 7 days Tresiba inject 5 units at bedtime Oxycodone 5-325 mg every 6 hours as needed Saccharmonyces boulardii 250 mg twice daily for 5 days   Medication Changes at Hospital Discharge: None   Medications Discontinued at Hospital Discharge: Losartan 100 mg daily Zofran 4 mg as needed Ozempic 1 mg weekly Solostar   Medications that remain the same after Hospital Discharge:??  -All other medications will remain the same.      Medications: Outpatient Encounter Medications as of 04/16/2021  Medication Sig   acetaminophen (TYLENOL) 325 MG tablet Take 2 tablets (650 mg total) by mouth every 6 (six) hours as needed for moderate pain or mild pain.   amLODipine (NORVASC) 5 MG tablet Take 1 tablet by mouth once daily   B Complex-C (B-COMPLEX WITH VITAMIN C) tablet Take 1 tablet by mouth daily.   Biotin 10000 MCG TABS Take 10,000 mcg by mouth daily.   Cholecalciferol 5000 units TABS Take 5,000 Units by mouth daily.   colchicine 0.6 MG tablet Take 1 tablet by mouth for gout attack.   Cranberry-Vitamin C 250-60 MG CAPS Take 1 capsule by mouth daily.   diclofenac Sodium (VOLTAREN) 1 % GEL APPLY 2 GRAMS TO AFFECTED AREA(S) 4 TIMES DAILY AS NEEDED (Patient taking differently: Apply 2-4 g topically every 6 (six) hours as needed (to affected areas).)   gabapentin (NEURONTIN) 300 MG capsule Take 600 mg by mouth at bedtime.   glucose blood (ONETOUCH VERIO) test strip USE TO CHECK BLOOD SUGAR   TWO TIMES A DAY AS         INSTRUCTED  hydrochlorothiazide (HYDRODIURIL) 25 MG tablet Take 1 tablet (25 mg total) by mouth daily. (Patient not taking: Reported on 03/28/2021)   Lancets (ONETOUCH DELICA PLUS [id number]) MISC USE TO CHECK BLOOD SUGARS  TWO TIMES A DAY AS         INSTRUCTED   losartan (COZAAR) 100 MG tablet TAKE 1 TABLET DAILY    metoprolol succinate (TOPROL-XL) 25 MG 24 hr tablet TAKE 1 TABLET AT BEDTIME   (DISCONTINUE COREG)   Multiple Vitamins-Minerals (MULTIVITAMIN WITH MINERALS) tablet Take 1 tablet by mouth daily. Women's 50+   polyethylene glycol (MIRALAX / GLYCOLAX) 17 g packet Take 17 g by mouth daily.   Semaglutide, 1 MG/DOSE, (OZEMPIC, 1 MG/DOSE,) 4 MG/3ML SOPN Inject 1 mg into the skin once a week.   Semaglutide, 1 MG/DOSE, (OZEMPIC, 1 MG/DOSE,) 4 MG/3ML SOPN Inject 1 mg into the skin once a week.   simvastatin (ZOCOR) 10 MG tablet Take 1 tablet (10 mg total) by mouth daily. (Patient taking differently: Take 10 mg by mouth at bedtime.)   SYNTHROID 112 MCG tablet Take 112 mcg by mouth daily before breakfast.   warfarin (COUMADIN) 5 MG tablet Take 1 tablet (5 mg total) by mouth daily.   [DISCONTINUED] SEMGLEE, YFGN, 100 UNIT/ML Pen Inject 9 Units into the skin at bedtime.   No facility-administered encounter medications on file as of 04/16/2021.  Recent Relevant Labs: Lab Results  Component Value Date/Time   HGBA1C 6.5 (H) 02/27/2021 10:30 AM   HGBA1C 6.3 (H) 12/25/2020 12:26 PM   MICROALBUR 30 02/27/2021 10:48 AM   MICROALBUR 10 02/09/2020 10:59 AM    Kidney Function Lab Results  Component Value Date/Time   CREATININE 1.02 (H) 03/28/2021 09:57 AM   CREATININE 1.11 (H) 02/27/2021 10:30 AM   CREATININE 1.23 (H) 12/25/2020 12:26 PM   GFRNONAA 57 (L) 03/28/2021 09:57 AM   GFRAA 67 11/29/2019 02:11 PM    Current antihyperglycemic regimen:  Ozempic 1 mg weekly  What recent interventions/DTPs have been made to improve glycemic control:  Educated on A1c and blood sugar goals Complications of diabetes including kidney damage, retinal damage, and cardiovascular disease Benefits of weight loss  Have there been any recent hospitalizations or ED visits since last visit with CPP? Yes  Patient denies hypoglycemic symptoms  Patient denies hyperglycemic symptoms  How often are you checking your blood  sugar? once daily  What are your blood sugars ranging?  Fasting: 90, 123 Before meals: None After meals: None Bedtime: None  During the week, how often does your blood glucose drop below 70? Never  Are you checking your feet daily/regularly? Daily  Adherence Review: Is the patient currently on a STATIN medication? Yes Is the patient currently on ACE/ARB medication? No Does the patient have >5 day gap between last estimated fill dates? No  Care Gaps: Covid booster overdue AWV 10-10-2021  Star Rating Drugs: Ozempic 1 mg- Patient assistance Simvastatin 10 mg- Last filled 01-23-2021 90 [address] [place] Clinical Pharmacist Assistant [phone]

## 2021-04-17 ENCOUNTER — Ambulatory Visit: Payer: Self-pay

## 2021-04-17 ENCOUNTER — Telehealth: Payer: Medicare HMO

## 2021-04-17 DIAGNOSIS — F419 Anxiety disorder, unspecified: Secondary | ICD-10-CM

## 2021-04-17 DIAGNOSIS — I129 Hypertensive chronic kidney disease with stage 1 through stage 4 chronic kidney disease, or unspecified chronic kidney disease: Secondary | ICD-10-CM

## 2021-04-17 DIAGNOSIS — Z6836 Body mass index (BMI) 36.0-36.9, adult: Secondary | ICD-10-CM

## 2021-04-17 DIAGNOSIS — N183 Chronic kidney disease, stage 3 unspecified: Secondary | ICD-10-CM

## 2021-04-17 DIAGNOSIS — E1122 Type 2 diabetes mellitus with diabetic chronic kidney disease: Secondary | ICD-10-CM

## 2021-04-17 DIAGNOSIS — I82452 Acute embolism and thrombosis of left peroneal vein: Secondary | ICD-10-CM

## 2021-04-17 NOTE — Chronic Care Management (AMB) (Signed)
Chronic Care Management   CCM RN Visit Note  04/17/2021 Name: Teresa Golden MRN: [id number] DOB: 11-07-1945  Subjective: Teresa Golden is a 76 y.o. year old female who is a primary care patient of Glendale Chard, MD. The care management team was consulted for assistance with disease management and care coordination needs.    Engaged with patient by telephone for follow up visit in response to provider referral for case management and/or care coordination services.   Consent to Services:  The patient was given information about Chronic Care Management services, agreed to services, and gave verbal consent prior to initiation of services.  Please see initial visit note for detailed documentation.   Patient agreed to services and verbal consent obtained.   Assessment: Review of patient past medical history, allergies, medications, health status, including review of consultants reports, laboratory and other test data, was performed as part of comprehensive evaluation and provision of chronic care management services.   SDOH (Social Determinants of Health) assessments and interventions performed:  Yes, no acute challenges   CCM Care Plan  Allergies  Allergen Reactions   Meloxicam Other (See Comments)    Fever; muscle aches; "flu-like" symptoms "Allergic," per [facility]   Other Other (See Comments)    "Rose fever and hay fever"    Outpatient Encounter Medications as of 04/17/2021  Medication Sig   acetaminophen (TYLENOL) 325 MG tablet Take 2 tablets (650 mg total) by mouth every 6 (six) hours as needed for moderate pain or mild pain.   amLODipine (NORVASC) 5 MG tablet Take 1 tablet by mouth once daily   B Complex-C (B-COMPLEX WITH VITAMIN C) tablet Take 1 tablet by mouth daily.   Biotin 10000 MCG TABS Take 10,000 mcg by mouth daily.   Cholecalciferol 5000 units TABS Take 5,000 Units by mouth daily.   colchicine 0.6 MG tablet Take 1 tablet by mouth for gout attack.   Cranberry-Vitamin C  250-60 MG CAPS Take 1 capsule by mouth daily.   diclofenac Sodium (VOLTAREN) 1 % GEL APPLY 2 GRAMS TO AFFECTED AREA(S) 4 TIMES DAILY AS NEEDED (Patient taking differently: Apply 2-4 g topically every 6 (six) hours as needed (to affected areas).)   gabapentin (NEURONTIN) 300 MG capsule Take 600 mg by mouth at bedtime.   glucose blood (ONETOUCH VERIO) test strip USE TO CHECK BLOOD SUGAR   TWO TIMES A DAY AS         INSTRUCTED   hydrochlorothiazide (HYDRODIURIL) 25 MG tablet Take 1 tablet (25 mg total) by mouth daily. (Patient not taking: Reported on 03/28/2021)   Lancets (ONETOUCH DELICA PLUS [id number]) MISC USE TO CHECK BLOOD SUGARS  TWO TIMES A DAY AS         INSTRUCTED   losartan (COZAAR) 100 MG tablet TAKE 1 TABLET DAILY   metoprolol succinate (TOPROL-XL) 25 MG 24 hr tablet TAKE 1 TABLET AT BEDTIME   (DISCONTINUE COREG)   Multiple Vitamins-Minerals (MULTIVITAMIN WITH MINERALS) tablet Take 1 tablet by mouth daily. Women's 50+   polyethylene glycol (MIRALAX / GLYCOLAX) 17 g packet Take 17 g by mouth daily.   Semaglutide, 1 MG/DOSE, (OZEMPIC, 1 MG/DOSE,) 4 MG/3ML SOPN Inject 1 mg into the skin once a week.   Semaglutide, 1 MG/DOSE, (OZEMPIC, 1 MG/DOSE,) 4 MG/3ML SOPN Inject 1 mg into the skin once a week.   simvastatin (ZOCOR) 10 MG tablet Take 1 tablet (10 mg total) by mouth daily. (Patient taking differently: Take 10 mg by mouth at bedtime.)  SYNTHROID 112 MCG tablet Take 112 mcg by mouth daily before breakfast.   warfarin (COUMADIN) 5 MG tablet Take 1 tablet (5 mg total) by mouth daily.   [DISCONTINUED] SEMGLEE, YFGN, 100 UNIT/ML Pen Inject 9 Units into the skin at bedtime.   No facility-administered encounter medications on file as of 04/17/2021.    Patient Active Problem List   Diagnosis Date Noted   Long term (current) use of anticoagulants 04/05/2021   Acute venous embolism and thrombosis of deep vessels of proximal lower extremity ([place]) 04/05/2021   Infected wound 11/02/2020   Pressure  injury of skin 10/22/2020   Wound infection after surgery 10/12/2020   Normocytic anemia 10/12/2020   AKI (acute kidney injury) ([place]) 10/12/2020   Obesity (BMI 30-39.9) 10/12/2020   Sepsis ([place]) 10/12/2020   Type 2 diabetes mellitus with stage 3 chronic kidney disease, with long-term current use of insulin ([place]) 11/29/2019   Panniculitis 09/30/2019   Back pain 09/30/2019   Hepatoma ([place]) 01/21/2019   Other cirrhosis of liver ([place]) 07/05/2018   Hypertensive nephropathy 07/05/2018   Chronic renal disease, stage III ([place]) 07/05/2018   Chronic left shoulder pain 07/05/2018   Hypothyroidism 02/06/2018   Hepatitis C virus infection cured after antiviral drug therapy 02/04/2018   Osteoarthritis of right knee 07/30/2017   S/P laparoscopic sleeve gastrectomy July 2018 08/26/2016   Preop cardiovascular exam 07/30/2016   Dyspnea on exertion 07/30/2016   Bilateral lower extremity edema 07/30/2016    Conditions to be addressed/monitored: DM, CKD III, Hypertensive Nephropathy, Class 2 severe obesity, Anxiety, Panniculitis, localized swelling of lower extremity  Care Plan : RN Care Manager Plan of Care  Updates made by Lynne Logan, RN since 04/17/2021 12:00 AM     Problem: No plan of care established for management of chronic disease states (DM, CKD III, Hypertensive Nephropathy, Class 2 severe obesity, Anxiety, Panniculitis, localized swelling of lower extremity)   Priority: High     Long-Range Goal: Development of plan for management of chronic disease management for DM, CKD III, Hypertensive Nephropathy, Class 2 severe obesity, Anxiety, Panniculitis, localized swelling of lower extremity   Start Date: 12/31/2020  Expected End Date: 12/31/2021  Recent Progress: On track  Priority: High  Note:   Current Barriers:  Knowledge Deficits related to plan of care for management of DM, CKD III, Hypertensive Nephropathy, Class 2 severe obesity, Anxiety, Panniculitis, localized swelling of lower  extremity Chronic Disease Management support and education needs related to DM, CKD III, Hypertensive Nephropathy, Class 2 severe obesity, Anxiety, Panniculitis, localized swelling of lower extremity  RNCM Clinical Goal(s):  Patient will verbalize basic understanding of  DM, CKD III, Hypertensive Nephropathy, Class 2 severe obesity, Anxiety, Panniculitis, localized swelling of lower extremity disease process and self health management plan   demonstrate Improved health management independence   continue to work with RN Care Manager to address care management and care coordination needs related to  DM, CKD III, Hypertensive Nephropathy, Class 2 severe obesity, Anxiety, Panniculitis, localized swelling of lower extremity will demonstrate ongoing self health care management ability    through collaboration with RN Care manager, provider, and care team.  Interventions: 1:1 collaboration with primary care provider regarding development and update of comprehensive plan of care as evidenced by provider attestation and co-signature Inter-disciplinary care team collaboration (see longitudinal plan of care) Evaluation of current treatment plan related to  self management and patient's adherence to plan as established by provider  Hypertension Interventions:  (Status:  Condition stable.  Not addressed this visit.) Long Term Goal Last practice recorded BP readings:  BP Readings from Last 3 Encounters:  02/27/21 130/70  01/16/21 (!) 154/80  12/25/20 120/68  Most recent eGFR/CrCl:  Lab Results  Component Value Date   EGFR 52 (L) 02/27/2021    No components found for: CRCL Evaluation of current treatment plan related to hypertension self management and patient's adherence to plan as established by provider Reviewed medications with patient and discussed importance of compliance Counseled on the importance of exercise goals with target of 150 minutes per week Advised patient, providing education and  rationale, to monitor blood pressure daily and record, calling PCP for findings outside established parameters Provided education on prescribed diet low Sodium   Discussed plans with patient for ongoing care management follow up and provided patient with direct contact information for care management team  Abdominal Wound Interventions: (Status: Goal Met.) Short Term Goal  Evaluation of current treatment plan related to  Abdominal wound , self-management and patient's adherence to plan as established by provider Reviewed and discussed recent follow up with on 03/14/21 with [facility] Determined patient's wounds following her Panniculectomy have healed, all wounds are closed and patient will resume normal activity with the exception of  avoiding submersion in bath or pool for at least a couple more months, otherwise no restrictions at this time.  No dressing changes needed.  Scar cream if desired.  Discussed patient has resumed exercising at her local gym weekly and will work her way to up to desired routine as tolerated   Discussed plans with patient for ongoing care management follow up and provided patient with direct contact information for care management team;   Diabetes Interventions:  (Status:  Condition stable.  Not addressed this visit.) Long Term Goal Assessed patient's understanding of A1c goal: <6.5% Review of patient status, including review of consultant's reports, relevant laboratory and other test results, and medications completed. Reviewed medications with patient and discussed importance of medication adherence Educated patient on dietary and exercise recommendations; daily glycemic control FBS 80-130, <180 after meals;15'15' rule Discussed plans with patient for ongoing care management follow up and provided patient with direct contact information for care management team Lab Results  Component Value Date   HGBA1C 6.5 (H) 02/27/2021    DVT of Lower  Extremity: (Status: Condition stable.  Not addressed this visit.) Long Term Goal  Evaluation of current treatment plan related to  DVT of LE , self-management and patient's adherence to plan as established by provider Received inbound call from patient stating she follow up with Dr. Lorenso Courier regarding treatment management of her DVT Determined Dr. Lorenso Courier will change patient's anticoagulant therapy from Xarelto to Warfarin and this will be managed by the Coumadin clinic at [facility] patient on the need for weekly PT/INR checks, educated on what foods contain Vitamin K that may interfere with PT/INR levels  Determined patient has enough Xarelto on hand until she switches over to Warfarin Collaboration with Dr. Baird Cancer, PCP regarding Dr. Libby Maw treatment plan per Ms. Giron Discussed plans with patient for ongoing care management follow up and provided patient with direct contact information for care management team  Pain Interventions:  (Status:  New goal.) Long Term Goal Determined patient is experiencing moderate to severe pain to her wrist, ankle and feet, she is having difficult opening a jar with due to increased wrist pain Pain assessment performed Medications reviewed, patient is unable to take Aleve due to being on Warfarin  Reviewed provider established plan for pain management Discussed importance of adherence to all scheduled medical appointments Counseled on the importance of reporting any/all new or changed pain symptoms or management strategies to pain management provider Advised patient to report to care team affect of pain on daily activities Advised patient to discuss recommendations for Rheumatology referral with provider Assessed social determinant of health barriers  Discussed plans with patient for ongoing care management follow up and provided patient with direct contact information for care management team  Elevated Vitamin B12: (Status:  New goal.)  Short Term  Goal Evaluation of current treatment plan related to  Elevated Vitamin B12 , self-management and patient's adherence to plan as established by provider Review of patient status, including review of consultant's reports, relevant laboratory and other test results, and medications completed Reviewed medications with patient and discussed importance of medication adherence Collaborated with PCP regarding how patient should take her Vitamin B Complex, advised patient of MD recommendations to take B complex on Monday, Wednesday and Friday Educated patient on the importance of implementing a daily exercise regimen, recommended 30 minutes daily, discussed patient plan to resume water aerobics in April  Mailed printed educational materials related to How to Perform Chair Exercises Discussed plans with patient for ongoing care management follow up and provided patient with direct contact information for care management team   Patient Goals/Self-Care Activities: Take all medications as prescribed Attend all scheduled provider appointments Call pharmacy for medication refills 3-7 days in advance of running out of medications Perform all self care activities independently  Perform IADL's (shopping, preparing meals, housekeeping, managing finances) independently Call provider office for new concerns or questions  drink 6 to 8 glasses of water each day manage portion size keep a blood pressure log take blood pressure log to all doctor appointments call doctor for signs and symptoms of high blood pressure report new symptoms to your doctor Establish a daily exercise routine   Follow Up Plan:  Telephone follow up appointment with care management team member scheduled for:  06/12/21    Barb Merino, RN, BSN, CCM Care Management Coordinator [place] [facility]  Direct Phone: [phone]

## 2021-04-17 NOTE — Patient Instructions (Signed)
Visit Information  Thank you for taking time to visit with me today. Please don't hesitate to contact me if I can be of assistance to you before our next scheduled telephone appointment.  Following are the goals we discussed today:  (Copy and paste patient goals from clinical care plan here)  Our next appointment is by telephone on 06/12/21 at 12 PM   Please call the care guide team at [phone] if you need to cancel or reschedule your appointment.   If you are experiencing a Mental Health or [place] or need someone to talk to, please call 1-800-273-TALK (toll free, 24 hour hotline)   Patient verbalizes understanding of instructions and care plan provided today and agrees to view in [place]. Active MyChart status confirmed with patient.    Barb Merino, RN, BSN, CCM Care Management Coordinator [place] [facility]  Direct Phone: [phone]

## 2021-04-19 ENCOUNTER — Other Ambulatory Visit: Payer: Self-pay

## 2021-04-19 ENCOUNTER — Ambulatory Visit (INDEPENDENT_AMBULATORY_CARE_PROVIDER_SITE_OTHER): Payer: Medicare HMO

## 2021-04-19 DIAGNOSIS — M25472 Effusion, left ankle: Secondary | ICD-10-CM

## 2021-04-19 DIAGNOSIS — M25471 Effusion, right ankle: Secondary | ICD-10-CM | POA: Diagnosis not present

## 2021-04-19 DIAGNOSIS — Z7901 Long term (current) use of anticoagulants: Secondary | ICD-10-CM | POA: Diagnosis not present

## 2021-04-19 DIAGNOSIS — I82452 Acute embolism and thrombosis of left peroneal vein: Secondary | ICD-10-CM | POA: Diagnosis not present

## 2021-04-19 LAB — POCT INR: INR: 2 (ref 2.0–3.0)

## 2021-04-19 MED ORDER — WARFARIN SODIUM 5 MG PO TABS: 5.0000 mg | ORAL_TABLET | Freq: Every day | ORAL | 1 refills | Status: DC

## 2021-04-19 NOTE — Patient Instructions (Signed)
INCREASE to 1 tablet Daily, except 1.5 tablets on Monday and Wednesday.  INR 2 weeks.

## 2021-04-23 ENCOUNTER — Other Ambulatory Visit: Payer: Self-pay

## 2021-04-23 ENCOUNTER — Ambulatory Visit (INDEPENDENT_AMBULATORY_CARE_PROVIDER_SITE_OTHER): Payer: Medicare HMO | Admitting: Addiction (Substance Use Disorder)

## 2021-04-23 DIAGNOSIS — N183 Chronic kidney disease, stage 3 unspecified: Secondary | ICD-10-CM

## 2021-04-23 DIAGNOSIS — F4323 Adjustment disorder with mixed anxiety and depressed mood: Secondary | ICD-10-CM | POA: Diagnosis not present

## 2021-04-23 DIAGNOSIS — N182 Chronic kidney disease, stage 2 (mild): Secondary | ICD-10-CM | POA: Diagnosis not present

## 2021-04-23 DIAGNOSIS — I129 Hypertensive chronic kidney disease with stage 1 through stage 4 chronic kidney disease, or unspecified chronic kidney disease: Secondary | ICD-10-CM

## 2021-04-23 DIAGNOSIS — E1122 Type 2 diabetes mellitus with diabetic chronic kidney disease: Secondary | ICD-10-CM | POA: Diagnosis not present

## 2021-04-23 DIAGNOSIS — Z794 Long term (current) use of insulin: Secondary | ICD-10-CM

## 2021-04-23 NOTE — Progress Notes (Signed)
[facility] Counselor/Therapist Progress Note  [name], MRN: [id number],    Date: 04/23/2021   Time Spent:  68mns  Treatment Type: Individual Therapy  Reported Symptoms: confused, sad  Mental Status Exam:  Appearance:   Casual and Well Groomed     Behavior:  Appropriate and Sharing  Motor:  Normal  Speech/Language:   Clear and Coherent and Normal Rate  Affect:  Appropriate  Mood:  anxious and sad  Thought process:  normal  Thought content:    Rumination  Sensory/Perceptual disturbances:    WNL  Orientation:  x4  Attention:  Good  Concentration:  Good  Memory:  WNL  Fund of knowledge:   Good  Insight:    Good  Judgment:   Fair  Impulse Control:  Fair   Risk Assessment: Danger to Self:  No Self-injurious Behavior: No Danger to Others: No Duty to Warn:no Physical Aggression / Violence:No  Access to Firearms a concern: No  Gang Involvement:No    Subjective: Client reported feeling conflicted about her ex that reached out to her and professed his continued love for her. Client processed feeling like shes a secret bec this man has a gf and never chooses the client first. Client processed her desire for him to commit to her and choose her, but her grief that he never chooses her. Therapist used MI & CBT with client to support her in processing her thoughts and feelings. Therapist used grief therapy to help her process her inability to get her needs met. Client dealing with ambivalence and therapist used MI to try to motivate client to make a choice to choose to respect herself. Therapist assessed for stability and client denied SI/HI/AVH.   Interventions: Cognitive Behavioral Therapy, Motivational Interviewing, Grief Therapy, and RPT    Diagnosis:   ICD-10-CM   1. Adjustment disorder with mixed anxiety and depressed mood  F43.23       Plan of Care:  Client is to return to therapy with therapist every 1-2 weeks as needed to process traumas/  frustrations in a safe space, to be re-evaluated in 3 months.  Client is to practice mindfulness AEB daily meditation and body scans or as needed when flooded by emotion/pain.  Client is to learn/practice DBT wise mind & radical acceptance. Client is to practice self-compassion AEB being gentle with themselves, utilizing self-care techniques daily or as needed when grieving something in the moment.  Client is to process grief/pain of their loss in a somatic body-felt sense way: ie using mindfulness, brainspotting, or trauma release as a method for releasing body pain/tension caused by grief.  KBarnie Del LCSW, LCAS, CCTP, CCS-I, BSP

## 2021-04-24 ENCOUNTER — Other Ambulatory Visit: Payer: Self-pay

## 2021-04-24 ENCOUNTER — Telehealth: Payer: Self-pay

## 2021-04-24 MED ORDER — OZEMPIC (1 MG/DOSE) 4 MG/3ML ~~LOC~~ SOPN: 1.0000 mg | PEN_INJECTOR | SUBCUTANEOUS | 1 refills | Status: AC

## 2021-04-24 NOTE — Chronic Care Management (AMB) (Addendum)
? ? ?  Chronic Care Management ?Pharmacy Assistant  ? ?Name: Teresa Golden  MRN: [id number] DOB: 06-Jul-1945 ? ?Reason for Encounter: Ozempic voucher ?  ? ?Medications: ?Outpatient Encounter Medications as of 04/24/2021  ?Medication Sig  ? acetaminophen (TYLENOL) 325 MG tablet Take 2 tablets (650 mg total) by mouth every 6 (six) hours as needed for moderate pain or mild pain.  ? amLODipine (NORVASC) 5 MG tablet Take 1 tablet by mouth once daily  ? B Complex-C (B-COMPLEX WITH VITAMIN C) tablet Take 1 tablet by mouth daily.  ? Biotin 10000 MCG TABS Take 10,000 mcg by mouth daily.  ? Cholecalciferol 5000 units TABS Take 5,000 Units by mouth daily.  ? colchicine 0.6 MG tablet Take 1 tablet by mouth for gout attack.  ? Cranberry-Vitamin C 250-60 MG CAPS Take 1 capsule by mouth daily.  ? diclofenac Sodium (VOLTAREN) 1 % GEL APPLY 2 GRAMS TO AFFECTED AREA(S) 4 TIMES DAILY AS NEEDED (Patient taking differently: Apply 2-4 g topically every 6 (six) hours as needed (to affected areas).)  ? gabapentin (NEURONTIN) 300 MG capsule Take 600 mg by mouth at bedtime.  ? glucose blood (ONETOUCH VERIO) test strip USE TO CHECK BLOOD SUGAR   TWO TIMES A DAY AS         INSTRUCTED  ? hydrochlorothiazide (HYDRODIURIL) 25 MG tablet Take 1 tablet (25 mg total) by mouth daily. (Patient not taking: Reported on 03/28/2021)  ? Lancets (ONETOUCH DELICA PLUS [id number]) MISC USE TO CHECK BLOOD SUGARS  TWO TIMES A DAY AS         INSTRUCTED  ? losartan (COZAAR) 100 MG tablet TAKE 1 TABLET DAILY  ? metoprolol succinate (TOPROL-XL) 25 MG 24 hr tablet TAKE 1 TABLET AT BEDTIME   (DISCONTINUE COREG)  ? Multiple Vitamins-Minerals (MULTIVITAMIN WITH MINERALS) tablet Take 1 tablet by mouth daily. Women's 50+  ? polyethylene glycol (MIRALAX / GLYCOLAX) 17 g packet Take 17 g by mouth daily.  ? Semaglutide, 1 MG/DOSE, (OZEMPIC, 1 MG/DOSE,) 4 MG/3ML SOPN Inject 1 mg into the skin once a week.  ? Semaglutide, 1 MG/DOSE, (OZEMPIC, 1 MG/DOSE,) 4 MG/3ML SOPN Inject 1 mg into  the skin once a week.  ? simvastatin (ZOCOR) 10 MG tablet Take 1 tablet (10 mg total) by mouth daily. (Patient taking differently: Take 10 mg by mouth at bedtime.)  ? SYNTHROID 112 MCG tablet Take 112 mcg by mouth daily before breakfast.  ? warfarin (COUMADIN) 5 MG tablet Take 1 tablet (5 mg total) by mouth daily.  ? [DISCONTINUED] SEMGLEE, YFGN, 100 UNIT/ML Pen Inject 9 Units into the skin at bedtime.  ? ?No facility-administered encounter medications on file as of 04/24/2021.  ? ? ?04-24-2021: Patient called stating she is out of Ozempic. Called [facility] and was able to issue voucher for 30 days.  ?BIN [id number] ?PCN [id number] ?GP [id number] ?ID [id number] ? ?Contacted patient to inform and clarify which pharmacy to send voucher to. Patient wants to send rx to [facility] on [address], medication in stock. Sent [place] CMA to send 30 DS to [facility]. Voucher applied and patient will pick medication up tomorrow. ? ?Jeannette How CMA ?Clinical Pharmacist Assistant ?[phone] ? ?

## 2021-04-24 NOTE — Chronic Care Management (AMB) (Signed)
I have reviewed the care management and care coordination activities outlined in this encounter and I am certifying that I agree with the content of this note. No further action required. ? ?Mayford Knife, RPH ?04/24/21 2:24 PM ? ?

## 2021-04-30 ENCOUNTER — Telehealth: Payer: Self-pay

## 2021-04-30 NOTE — Chronic Care Management (AMB) (Addendum)
? ? ?Chronic Care Management ?Pharmacy Assistant  ? ?Name: Teresa Golden  MRN: [id number] DOB: 1945/08/25 ? ?Reason for Encounter: Disease State/ Hypertension ? ?Recent office visits:  ?None ? ?Recent consult visits:  ?04-23-2021 Tanna Furry (social worker). Unable to view encounter ? ?04-19-2021 Frederik Schmidt, RN (Cardiology). Anti-coag visit. ? ?Hospital visits:  ?None in previous 6 months ? ?Medications: ?Outpatient Encounter Medications as of 04/30/2021  ?Medication Sig  ? acetaminophen (TYLENOL) 325 MG tablet Take 2 tablets (650 mg total) by mouth every 6 (six) hours as needed for moderate pain or mild pain.  ? amLODipine (NORVASC) 5 MG tablet Take 1 tablet by mouth once daily  ? B Complex-C (B-COMPLEX WITH VITAMIN C) tablet Take 1 tablet by mouth daily.  ? Biotin 10000 MCG TABS Take 10,000 mcg by mouth daily.  ? Cholecalciferol 5000 units TABS Take 5,000 Units by mouth daily.  ? colchicine 0.6 MG tablet Take 1 tablet by mouth for gout attack.  ? Cranberry-Vitamin C 250-60 MG CAPS Take 1 capsule by mouth daily.  ? diclofenac Sodium (VOLTAREN) 1 % GEL APPLY 2 GRAMS TO AFFECTED AREA(S) 4 TIMES DAILY AS NEEDED (Patient taking differently: Apply 2-4 g topically every 6 (six) hours as needed (to affected areas).)  ? gabapentin (NEURONTIN) 300 MG capsule Take 600 mg by mouth at bedtime.  ? glucose blood (ONETOUCH VERIO) test strip USE TO CHECK BLOOD SUGAR   TWO TIMES A DAY AS         INSTRUCTED  ? hydrochlorothiazide (HYDRODIURIL) 25 MG tablet Take 1 tablet (25 mg total) by mouth daily. (Patient not taking: Reported on 03/28/2021)  ? Lancets (ONETOUCH DELICA PLUS [id number]) MISC USE TO CHECK BLOOD SUGARS  TWO TIMES A DAY AS         INSTRUCTED  ? losartan (COZAAR) 100 MG tablet TAKE 1 TABLET DAILY  ? metoprolol succinate (TOPROL-XL) 25 MG 24 hr tablet TAKE 1 TABLET AT BEDTIME   (DISCONTINUE COREG)  ? Multiple Vitamins-Minerals (MULTIVITAMIN WITH MINERALS) tablet Take 1 tablet by mouth daily. Women's 50+  ?  polyethylene glycol (MIRALAX / GLYCOLAX) 17 g packet Take 17 g by mouth daily.  ? Semaglutide, 1 MG/DOSE, (OZEMPIC, 1 MG/DOSE,) 4 MG/3ML SOPN Inject 1 mg into the skin once a week.  ? Semaglutide, 1 MG/DOSE, (OZEMPIC, 1 MG/DOSE,) 4 MG/3ML SOPN Inject 1 mg into the skin once a week.  ? simvastatin (ZOCOR) 10 MG tablet Take 1 tablet (10 mg total) by mouth daily. (Patient taking differently: Take 10 mg by mouth at bedtime.)  ? SYNTHROID 112 MCG tablet Take 112 mcg by mouth daily before breakfast.  ? warfarin (COUMADIN) 5 MG tablet Take 1 tablet (5 mg total) by mouth daily.  ? [DISCONTINUED] SEMGLEE, YFGN, 100 UNIT/ML Pen Inject 9 Units into the skin at bedtime.  ? ?No facility-administered encounter medications on file as of 04/30/2021.  ?Reviewed chart prior to disease state call. Spoke with patient regarding BP ? ?Recent Office Vitals: ?BP Readings from Last 3 Encounters:  ?03/28/21 (!) 159/68  ?02/27/21 130/70  ?01/16/21 (!) 154/80  ? ?Pulse Readings from Last 3 Encounters:  ?03/28/21 78  ?02/27/21 77  ?01/16/21 81  ?  ?Wt Readings from Last 3 Encounters:  ?03/28/21 199 lb 14.4 oz (90.7 kg)  ?02/27/21 203 lb (92.1 kg)  ?01/16/21 181 lb (82.1 kg)  ?  ? ?Kidney Function ?Lab Results  ?Component Value Date/Time  ? CREATININE 1.02 (H) 03/28/2021 09:57 AM  ? CREATININE 1.11 (  H) 02/27/2021 10:30 AM  ? CREATININE 1.23 (H) 12/25/2020 12:26 PM  ? GFRNONAA 57 (L) 03/28/2021 09:57 AM  ? GFRAA 67 11/29/2019 02:11 PM  ? ? ?BMP Latest Ref Rng & Units 03/28/2021 02/27/2021 12/25/2020  ?Glucose 70 - 99 mg/dL 173(H) 120(H) 165(H)  ?BUN 8 - 23 mg/dL '15 21 14  '$ ?Creatinine 0.44 - 1.00 mg/dL 1.02(H) 1.11(H) 1.23(H)  ?BUN/Creat Ratio 12 - 28 - 19 11(L)  ?Sodium 135 - 145 mmol/L 140 139 144  ?Potassium 3.5 - 5.1 mmol/L 3.9 3.9 3.9  ?Chloride 98 - 111 mmol/L 107 105 107(H)  ?CO2 22 - 32 mmol/L '25 22 23  '$ ?Calcium 8.9 - 10.3 mg/dL 9.5 9.9 9.5  ? ? ?Current antihypertensive regimen:  ?Amlodipine 5 mg daily ?HCTZ 25 mg daily ?Metoprolol 25 mg  daily ? ?How often are you checking your Blood Pressure? daily ? ?Current home BP readings: 134/69, 128/70 ? ?What recent interventions/DTPs have been made by any provider to improve Blood Pressure control since last CPP Visit:  ?Educated on Importance of home blood pressure monitoring; ?Proper BP monitoring technique; ?-Counseled to monitor BP at home at least once per week, document, and provide log at future appointments ?-Counseled on diet and exercise extensively ?Recommended to continue current medication ? ?Any recent hospitalizations or ED visits since last visit with CPP? No ? ?What diet changes have been made to improve Blood Pressure Control?  ?Patient states she is limiting her salt intake and drinking plenty of water daily. ? ?What exercise is being done to improve your Blood Pressure Control?  ?Patient states she moves around a lot running errands. ? ?Adherence Review: ?Is the patient currently on ACE/ARB medication? Yes ?Does the patient have >5 day gap between last estimated fill dates? No ? ?05-01-2021: Completed and uploaded reorder form for [place] and Ozempic. [name] Informed to print and have signed. ? ?Care Gaps: ?Covid booster overdue ?AWV 10-10-2021 ? ?Star Rating Drugs: ?Ozempic 1 mg- Patient assistance ?Simvastatin 10 mg- Last filled 01-23-2021 90 DS [facility] ?Losartan 100 mg- Last filled 04-11-2021 [facility] ? ?Malecca Hicks CMA ?Clinical Pharmacist Assistant ?[phone] ? ?

## 2021-05-02 ENCOUNTER — Encounter: Payer: Self-pay | Admitting: Internal Medicine

## 2021-05-02 ENCOUNTER — Ambulatory Visit (INDEPENDENT_AMBULATORY_CARE_PROVIDER_SITE_OTHER): Payer: Medicare HMO | Admitting: Internal Medicine

## 2021-05-02 ENCOUNTER — Other Ambulatory Visit: Payer: Self-pay

## 2021-05-02 VITALS — BP 120/76 | HR 60 | Temp 97.9°F | Ht 65.0 in | Wt 200.6 lb

## 2021-05-02 DIAGNOSIS — N1831 Chronic kidney disease, stage 3a: Secondary | ICD-10-CM

## 2021-05-02 DIAGNOSIS — M25531 Pain in right wrist: Secondary | ICD-10-CM

## 2021-05-02 DIAGNOSIS — E039 Hypothyroidism, unspecified: Secondary | ICD-10-CM

## 2021-05-02 DIAGNOSIS — E6609 Other obesity due to excess calories: Secondary | ICD-10-CM

## 2021-05-02 DIAGNOSIS — M2550 Pain in unspecified joint: Secondary | ICD-10-CM

## 2021-05-02 DIAGNOSIS — Z6833 Body mass index (BMI) 33.0-33.9, adult: Secondary | ICD-10-CM

## 2021-05-02 DIAGNOSIS — I129 Hypertensive chronic kidney disease with stage 1 through stage 4 chronic kidney disease, or unspecified chronic kidney disease: Secondary | ICD-10-CM

## 2021-05-02 MED ORDER — TRAMADOL HCL 50 MG PO TABS: 50.0000 mg | ORAL_TABLET | Freq: Four times a day (QID) | ORAL | 0 refills | Status: AC | PRN

## 2021-05-02 NOTE — Progress Notes (Signed)
?Rich Brave [place]ittleton,acting as a Education administrator for Maximino Greenland, MD.,have documented all relevant documentation on the behalf of Maximino Greenland, MD,as directed by  Maximino Greenland, MD while in the presence of Maximino Greenland, MD.  ?This visit occurred during the SARS-CoV-2 public health emergency.  Safety protocols were in place, including screening questions prior to the visit, additional usage of staff PPE, and extensive cleaning of exam room while observing appropriate contact time as indicated for disinfecting solutions. ? ?Subjective:  ?  ? [name] , female    DOB: 1945-09-23 , 76 y.o.   MRN: [id number] ? ? ?Chief Complaint  ?Patient presents with  ? Referral  ? ? ?HPI ? ?Patient presents today for a referral to a rheumatologist for the arthritis pain in her legs. Pt would like to go to Dr.Deveshwar. She called their office, but she states she needs a referral. She complains of generalized aches/pains - right elbow, right ankle pains are the worst. She also c/o b/l hand pain.  ?  ? ?Past Medical History:  ?Diagnosis Date  ? Arthritis   ? Chronic kidney disease   ? self reports ckd stage 3   ? Depression   ? Diabetes mellitus, type II, insulin dependent ([place])   ? With neurologic complications. Bilateral lower extremity peripheral neuropathy  ? Dyspnea   ? with excertion; no issues now since weight loss surgery   ? Endometriosis   ? Essential hypertension   ? GERD (gastroesophageal reflux disease)   ? Hepatitis C   ? C dormant; states she is in remission since taking Harvoni   ? Hypertensive nephropathy 07/05/2018  ? Hypothyroidism   ? Hypothyroidism 02/06/2018  ? Morbid obesity with BMI of 50.0-59.9, adult ([place])   ? Scoliosis   ?  ? ?Family History  ?Problem Relation Age of Onset  ? Heart attack Mother   ? Heart failure Mother   ? Stroke Father   ? ? ? ?Current Outpatient Medications:  ?  amLODipine (NORVASC) 5 MG tablet, Take 1 tablet by mouth once daily, Disp: 90 tablet, Rfl: 0 ?  B Complex-C  (B-COMPLEX WITH VITAMIN C) tablet, Take 1 tablet by mouth 3 (three) times a week., Disp: , Rfl:  ?  Biotin 10000 MCG TABS, Take 10,000 mcg by mouth daily., Disp: , Rfl:  ?  Cholecalciferol 5000 units TABS, Take 5,000 Units by mouth daily., Disp: , Rfl:  ?  colchicine 0.6 MG tablet, Take 1 tablet by mouth for gout attack., Disp: 5 tablet, Rfl: 0 ?  diclofenac Sodium (VOLTAREN) 1 % GEL, APPLY 2 GRAMS TO AFFECTED AREA(S) 4 TIMES DAILY AS NEEDED (Patient taking differently: Apply 2-4 g topically every 6 (six) hours as needed (to affected areas).), Disp: 200 g, Rfl: 2 ?  gabapentin (NEURONTIN) 300 MG capsule, Take 600 mg by mouth at bedtime., Disp: , Rfl:  ?  glucose blood (ONETOUCH VERIO) test strip, USE TO CHECK BLOOD SUGAR   TWO TIMES A DAY AS         INSTRUCTED, Disp: 200 strip, Rfl: 3 ?  Lancets (ONETOUCH DELICA PLUS [id number]) MISC, USE TO CHECK BLOOD SUGARS  TWO TIMES A DAY AS         INSTRUCTED, Disp: 200 each, Rfl: 3 ?  losartan (COZAAR) 100 MG tablet, TAKE 1 TABLET DAILY, Disp: 90 tablet, Rfl: 2 ?  metoprolol succinate (TOPROL-XL) 25 MG 24 hr tablet, TAKE 1 TABLET AT BEDTIME   (DISCONTINUE COREG), Disp:  90 tablet, Rfl: 1 ?  Multiple Vitamins-Minerals (MULTIVITAMIN WITH MINERALS) tablet, Take 1 tablet by mouth daily. Women's 50+, Disp: , Rfl:  ?  Semaglutide, 1 MG/DOSE, (OZEMPIC, 1 MG/DOSE,) 4 MG/3ML SOPN, Inject 1 mg into the skin once a week., Disp: 9 mL, Rfl: 1 ?  simvastatin (ZOCOR) 10 MG tablet, Take 1 tablet (10 mg total) by mouth daily. (Patient taking differently: Take 10 mg by mouth at bedtime.), Disp: 90 tablet, Rfl: 1 ?  SYNTHROID 112 MCG tablet, Take 112 mcg by mouth daily before breakfast., Disp: , Rfl:  ?  traMADol (ULTRAM) 50 MG tablet, Take 1 tablet (50 mg total) by mouth every 6 (six) hours as needed., Disp: 20 tablet, Rfl: 0 ?  warfarin (COUMADIN) 5 MG tablet, Take 1 tablet (5 mg total) by mouth daily., Disp: 60 tablet, Rfl: 1 ?  Semaglutide, 1 MG/DOSE, (OZEMPIC, 1 MG/DOSE,) 4 MG/3ML SOPN,  Inject 1 mg into the skin once a week., Disp: 9 mL, Rfl: 1  ? ?Allergies  ?Allergen Reactions  ? Meloxicam Other (See Comments)  ?  Fever; muscle aches; "flu-like" symptoms ?"Allergic," per MAR  ? Other Other (See Comments)  ?  "Rose fever and hay fever"  ?  ? ?Review of Systems  ?Constitutional: Negative.   ?Respiratory: Negative.    ?Cardiovascular: Negative.   ?Gastrointestinal: Negative.   ?Neurological: Negative.   ?Psychiatric/Behavioral: Negative.     ? ?Today's Vitals  ? 05/02/21 1434  ?BP: 120/76  ?Pulse: 60  ?Temp: 97.9 ?F (36.6 ?C)  ?Weight: 200 lb 9.6 oz (91 kg)  ?Height: '5\' 5"'$  (1.651 m)  ?PainSc: 0-No pain  ? ?Body mass index is 33.38 kg/m?.  ?Wt Readings from Last 3 Encounters:  ?05/02/21 200 lb 9.6 oz (91 kg)  ?03/28/21 199 lb 14.4 oz (90.7 kg)  ?02/27/21 203 lb (92.1 kg)  ?  ?Objective:  ?Physical Exam ?Vitals and nursing note reviewed.  ?Constitutional:   ?   Appearance: Normal appearance.  ?HENT:  ?   Head: Normocephalic and atraumatic.  ?Cardiovascular:  ?   Rate and Rhythm: Normal rate and regular rhythm.  ?   Heart sounds: Normal heart sounds.  ?Pulmonary:  ?   Effort: Pulmonary effort is normal.  ?   Breath sounds: Normal breath sounds.  ?Musculoskeletal:  ?   Cervical back: Normal range of motion.  ?   Comments: Neg Tinel's sign, Pos Phalen's sign  ?Skin: ?   General: Skin is warm.  ?Neurological:  ?   General: No focal deficit present.  ?   Mental Status: She is alert.  ?Psychiatric:     ?   Mood and Affect: Mood normal.     ?   Behavior: Behavior normal.  ?  ? ?   ?Assessment And Plan:  ?   ?1. Arthralgia, unspecified joint ?Comments: I will check an arthritis panel. She is encouraged to follow an anti-inflammatory diet. I will determine if Rheum referral is appropriate after reviewing labs.  ? ?2. Primary hypothyroidism ?Comments: I will check thyroid panel and adjust meds as needed.  ?- TSH + free T4 ? ?3. Right wrist pain ?Comments: Her sx are associated with RUE paresthesias and  weakness. States she now has difficulty opening jars. She agrees to EMG/NCS. ?- Ambulatory referral to Neurology ? ?4. Hypertensive nephropathy ?Comments: Chronic, well controlled. NO med changes. Encouraged to follow low sodium diet.  ? ?5. Stage 3a chronic kidney disease ([place]) ?Comments: Chronic, I will check Renal labs today.  She is encouraged to stay well hydrated, keep BP/BS controlled and avoid NSAIDs to decrease risk of CKD progression.  ?- Protein electrophoresis, serum ?- Parathyroid Hormone, Intact w/Ca ?- Phosphorus ? ?6. Class 1 obesity due to excess calories with serious comorbidity and body mass index (BMI) of 33.0 to 33.9 in adult ?Comments: She is encouraged to strive for BMI<30 to decrease cardiac risk. Advised to gradually increase her daily activity, aiming for at least 150 min/week.  ?  ?Patient was given opportunity to ask questions. Patient verbalized understanding of the plan and was able to repeat key elements of the plan. All questions were answered to their satisfaction.  ? ?I, Maximino Greenland, MD, have reviewed all documentation for this visit. The documentation on 05/02/21 for the exam, diagnosis, procedures, and orders are all accurate and complete.  ? ?IF YOU HAVE BEEN REFERRED TO A SPECIALIST, IT MAY TAKE 1-2 WEEKS TO SCHEDULE/PROCESS THE REFERRAL. IF YOU HAVE NOT HEARD FROM US/SPECIALIST IN TWO WEEKS, PLEASE GIVE [place] A CALL AT [phone].  ? ?THE PATIENT IS ENCOURAGED TO PRACTICE SOCIAL DISTANCING DUE TO THE COVID-19 PANDEMIC.   ?

## 2021-05-02 NOTE — Progress Notes (Deleted)
? ? ?Office Visit  ?  ?Patient Name: Teresa Golden ?Date of Encounter: 05/03/2021 ? ?Primary Care Provider:  Glendale Chard, MD ?Primary Cardiologist:  Elouise Munroe, MD ? ?Chief Complaint  ?  ?76 year old female with a history of RBBB, hypertension, CKD stage III, type 2 diabetes, GERD, hepatitis C, hypothyroidism, scoliosis, and obesity who presents for follow-up related to hypertension. ? ?Past Medical History  ?  ?Past Medical History:  ?Diagnosis Date  ? Arthritis   ? Chronic kidney disease   ? self reports ckd stage 3   ? Depression   ? Diabetes mellitus, type II, insulin dependent ([place])   ? With neurologic complications. Bilateral lower extremity peripheral neuropathy  ? Dyspnea   ? with excertion; no issues now since weight loss surgery   ? Endometriosis   ? Essential hypertension   ? GERD (gastroesophageal reflux disease)   ? Hepatitis C   ? C dormant; states she is in remission since taking Harvoni   ? Hypertensive nephropathy 07/05/2018  ? Hypothyroidism   ? Hypothyroidism 02/06/2018  ? Morbid obesity with BMI of 50.0-59.9, adult ([place])   ? Scoliosis   ? ?Past Surgical History:  ?Procedure Laterality Date  ? ABDOMINAL HYSTERECTOMY    ? uterus  ? CHOLECYSTECTOMY    ? DIAGNOSTIC LAPAROSCOPY    ? DILATION AND CURETTAGE OF UTERUS    ? EYE SURGERY    ? cataract extraction bilateral   ? INCISION AND DRAINAGE OF WOUND N/A 10/15/2020  ? Procedure: IRRIGATION AND DEBRIDEMENT WOUND;  Surgeon: Wallace Going, DO;  Location: [facility];  Service: Plastics;  Laterality: N/A;  60 min  ? JOINT REPLACEMENT    ? Left knee  ? KNEE ARTHROPLASTY Right 07/30/2017  ? Procedure: RIGHT TOTAL KNEE ARTHROPLASTY WITH COMPUTER NAVIGATION;  Surgeon: Rod Can, MD;  Location: [facility];  Service: Orthopedics;  Laterality: Right;  Needs RNFA  ? LAPAROSCOPIC GASTRIC BANDING    ? and reversal  ? LAPAROSCOPIC GASTRIC SLEEVE RESECTION N/A 08/26/2016  ? Procedure: LAPAROSCOPIC GASTRIC SLEEVE RESECTION WITH UPPER ENOD;  Surgeon:  Johnathan Hausen, MD;  Location: [facility];  Service: General;  Laterality: N/A;  ? PANNICULECTOMY N/A 09/26/2020  ? Procedure: PANNICULECTOMY;  Surgeon: Wallace Going, DO;  Location: [facility];  Service: Plastics;  Laterality: N/A;  ? TONSILLECTOMY    ? TRANSTHORACIC ECHOCARDIOGRAM  11/2013  ? EF 65-70%. Normal diastolic Fxn.  Normal Valves.  ? UMBILICAL HERNIA REPAIR N/A 09/26/2020  ? Procedure: HERNIA REPAIR UMBILICAL ADULT;  Surgeon: Georganna Skeans, MD;  Location: [facility];  Service: General;  Laterality: N/A;  ? ? ?Allergies ? ?Allergies  ?Allergen Reactions  ? Meloxicam Other (See Comments)  ?  Fever; muscle aches; "flu-like" symptoms ?"Allergic," per MAR  ? Other Other (See Comments)  ?  "Rose fever and hay fever"  ? ? ?History of Present Illness  ?  ?76 year old female with the above past medical history including RBBB, hypertension, DVT on coumadin, CKD stage III, type 2 diabetes, GERD, hepatitis C, hypothyroidism, scoliosis, and obesity. ? ?Prior echocardiogram in 2015 showed EF 65 to 70%, mild LVH.  Lexiscan Myoview in June 2018 was normal.  She was referred to Dr. Margaretann Loveless by her PCP in March 2022 in the setting of new onset RBBB on EKG following COVID-19 infection. She was asymptomatic at the time. Echocardiogram in April 2022 showed EF 60 to 65%, no RWMA, mild LVH, G1 DD, moderately dilated LA, trivial mitral valve regurgitation, trivial aortic valve  regurgitation.  No clinical or worrisome findings for new RBBB.  She was last seen virtually on 06/20/2020 and was stable from a cardiac standpoint.  Her BP was somewhat elevated. Titration of amlodipine versus addition of spironolactone was recommended in the case of persistently elevated BP.  She was diagnosed with a DVT in October 2022, subsequent Dopplers in January 2023 showed age-indeterminate DVT of left peroneal veins. She is following hematology oncology and is on Coumadin for this. ? ?She presents today for follow-up.  Since her last  visit ? ?RBBB: ?Hypertension: ?CKD stage III: ?Type 2 diabetes: ?DVT: ?Disposition: ? ?Home Medications  ?  ?Current Outpatient Medications  ?Medication Sig Dispense Refill  ? amLODipine (NORVASC) 5 MG tablet Take 1 tablet by mouth once daily 90 tablet 0  ? B Complex-C (B-COMPLEX WITH VITAMIN C) tablet Take 1 tablet by mouth 3 (three) times a week.    ? Biotin 10000 MCG TABS Take 10,000 mcg by mouth daily.    ? Cholecalciferol 5000 units TABS Take 5,000 Units by mouth daily.    ? colchicine 0.6 MG tablet Take 1 tablet by mouth for gout attack. 5 tablet 0  ? diclofenac Sodium (VOLTAREN) 1 % GEL APPLY 2 GRAMS TO AFFECTED AREA(S) 4 TIMES DAILY AS NEEDED (Patient taking differently: Apply 2-4 g topically every 6 (six) hours as needed (to affected areas).) 200 g 2  ? gabapentin (NEURONTIN) 300 MG capsule Take 600 mg by mouth at bedtime.    ? glucose blood (ONETOUCH VERIO) test strip USE TO CHECK BLOOD SUGAR   TWO TIMES A DAY AS         INSTRUCTED 200 strip 3  ? Lancets (ONETOUCH DELICA PLUS [id number]) MISC USE TO CHECK BLOOD SUGARS  TWO TIMES A DAY AS         INSTRUCTED 200 each 3  ? losartan (COZAAR) 100 MG tablet TAKE 1 TABLET DAILY 90 tablet 2  ? metoprolol succinate (TOPROL-XL) 25 MG 24 hr tablet TAKE 1 TABLET AT BEDTIME   (DISCONTINUE COREG) 90 tablet 1  ? Multiple Vitamins-Minerals (MULTIVITAMIN WITH MINERALS) tablet Take 1 tablet by mouth daily. Women's 50+    ? Semaglutide, 1 MG/DOSE, (OZEMPIC, 1 MG/DOSE,) 4 MG/3ML SOPN Inject 1 mg into the skin once a week. 9 mL 1  ? Semaglutide, 1 MG/DOSE, (OZEMPIC, 1 MG/DOSE,) 4 MG/3ML SOPN Inject 1 mg into the skin once a week. 9 mL 1  ? simvastatin (ZOCOR) 10 MG tablet Take 1 tablet (10 mg total) by mouth daily. (Patient taking differently: Take 10 mg by mouth at bedtime.) 90 tablet 1  ? SYNTHROID 112 MCG tablet Take 112 mcg by mouth daily before breakfast.    ? traMADol (ULTRAM) 50 MG tablet Take 1 tablet (50 mg total) by mouth every 6 (six) hours as needed. 20 tablet 0  ?  warfarin (COUMADIN) 5 MG tablet Take 1 tablet (5 mg total) by mouth daily. 60 tablet 1  ? ?No current facility-administered medications for this visit.  ?  ? ?Review of Systems  ?  ?***.  All other systems reviewed and are otherwise negative except as noted above. ?  ? ?Physical Exam  ?  ?VS:  There were no vitals taken for this visit. , BMI There is no height or weight on file to calculate BMI. ?    ?GEN: Well nourished, well developed, in no acute distress. ?HEENT: normal. ?Neck: Supple, no JVD, carotid bruits, or masses. ?Cardiac: RRR, no murmurs, rubs, or gallops.  No clubbing, cyanosis, edema.  Radials/DP/PT 2+ and equal bilaterally.  ?Respiratory:  Respirations regular and unlabored, clear to auscultation bilaterally. ?GI: Soft, nontender, nondistended, BS + x 4. ?MS: no deformity or atrophy. ?Skin: warm and dry, no rash. ?Neuro:  Strength and sensation are intact. ?Psych: Normal affect. ? ?Accessory Clinical Findings  ?  ?ECG personally reviewed by me today - *** - no acute changes. ? ?Lab Results  ?Component Value Date  ? WBC 8.2 03/28/2021  ? HGB 11.7 (L) 03/28/2021  ? HCT 35.0 (L) 03/28/2021  ? MCV 81.0 03/28/2021  ? PLT 228 03/28/2021  ? ?Lab Results  ?Component Value Date  ? CREATININE 1.02 (H) 03/28/2021  ? BUN 15 03/28/2021  ? NA 140 03/28/2021  ? K 3.9 03/28/2021  ? CL 107 03/28/2021  ? CO2 25 03/28/2021  ? ?Lab Results  ?Component Value Date  ? ALT 19 03/28/2021  ? AST 25 03/28/2021  ? ALKPHOS 81 03/28/2021  ? BILITOT 0.5 03/28/2021  ? ?Lab Results  ?Component Value Date  ? CHOL 158 02/27/2021  ? HDL 73 02/27/2021  ? [place] 74 02/27/2021  ? TRIG 50 02/27/2021  ? CHOLHDL 2.2 02/27/2021  ?  ?Lab Results  ?Component Value Date  ? HGBA1C 6.5 (H) 02/27/2021  ? ? ?Assessment & Plan  ?  ?1.  *** ? ? ?Lenna Sciara, NP ?05/03/2021, 6:10 AM ?  ?

## 2021-05-03 ENCOUNTER — Ambulatory Visit: Payer: Medicare HMO | Admitting: Nurse Practitioner

## 2021-05-06 LAB — PROTEIN ELECTROPHORESIS, SERUM
A/G Ratio: 1 (ref 0.7–1.7)
Albumin ELP: 3.6 g/dL (ref 2.9–4.4)
Alpha 1: 0.2 g/dL (ref 0.0–0.4)
Alpha 2: 0.8 g/dL (ref 0.4–1.0)
Beta: 1.1 g/dL (ref 0.7–1.3)
Gamma Globulin: 1.4 g/dL (ref 0.4–1.8)
Globulin, Total: 3.5 g/dL (ref 2.2–3.9)
M-Spike, %: 0.3 g/dL — ABNORMAL HIGH
Total Protein: 7.1 g/dL (ref 6.0–8.5)

## 2021-05-06 LAB — TSH+FREE T4
Free T4: 1.36 ng/dL (ref 0.82–1.77)
TSH: 2.47 u[IU]/mL (ref 0.450–4.500)

## 2021-05-06 LAB — PTH, INTACT AND CALCIUM
Calcium: 9.4 mg/dL (ref 8.7–10.3)
PTH: 48 pg/mL (ref 15–65)

## 2021-05-06 LAB — PHOSPHORUS: Phosphorus: 3.3 mg/dL (ref 3.0–4.3)

## 2021-05-07 ENCOUNTER — Ambulatory Visit (INDEPENDENT_AMBULATORY_CARE_PROVIDER_SITE_OTHER): Payer: Medicare HMO | Admitting: Addiction (Substance Use Disorder)

## 2021-05-07 ENCOUNTER — Other Ambulatory Visit: Payer: Self-pay

## 2021-05-07 DIAGNOSIS — F4323 Adjustment disorder with mixed anxiety and depressed mood: Secondary | ICD-10-CM | POA: Diagnosis not present

## 2021-05-07 NOTE — Progress Notes (Signed)
?      [facility] Counselor/Therapist Progress Note ? ?Patient ID: ELEXIS POLLAK, MRN: [id number],   ? ?Date: 05/07/2021 ?  ?Time Spent:  59mns ? ?Treatment Type: Individual Therapy ? ?Reported Symptoms: motivated,happier, relaxed ? ?Mental Status Exam: ? ?Appearance:   Casual and Well Groomed     ?Behavior:  Appropriate and Sharing  ?Motor:  Normal  ?Speech/Language:   Clear and Coherent and Normal Rate  ?Affect:  Appropriate  ?Mood:  normal  ?Thought process:  normal  ?Thought content:    Rumination  ?Sensory/Perceptual disturbances:    WNL  ?Orientation:  x4  ?Attention:  Good  ?Concentration:  Good  ?Memory:  WNL  ?Fund of knowledge:   Good  ?Insight:    Good  ?Judgment:   Fair  ?Impulse Control:  Good  ? ?Risk Assessment: ?Danger to Self:  No ?Self-injurious Behavior: No ?Danger to Others: No ?Duty to Warn:no ?Physical Aggression / Violence:No  ?Access to Firearms a concern: No  ?Gang Involvement:No  ? ?Subjective: Client reported feeling less lonely as she is getting together with some church friends and looking forward to celebrating with them on Easter. Client has low energy these days but is getting stronger as her blood clot is responding to the medications shes on. Client feeling motivated on finding an income based apartment to help her meet her budget needs better. Therapist used MI & CBT with client to help client process her progress  with her health and meeting some of her goals to work on moving apartments and getting her health up to par. Therapist assessed for stability and client denied SI/HI/AVH.  ? ?Interventions: Cognitive Behavioral Therapy, Motivational Interviewing, and RPT   ? ?Diagnosis: ?  ICD-10-CM   ?1. Adjustment disorder with mixed anxiety and depressed mood  F43.23   ?  ? ?Plan of Care:  ?Client is to return to therapy with therapist every 1-2 weeks as needed to process traumas/ frustrations in a safe space, to be re-evaluated in 3 months.  ?Client is to practice mindfulness AEB  daily meditation and body scans or as needed when flooded by emotion/pain.  ?Client is to learn/practice DBT wise mind & radical acceptance. ?Client is to practice self-compassion AEB being gentle with themselves, utilizing self-care techniques daily or as needed when grieving something in the moment.  ?Client is to process grief/pain of their loss in a somatic body-felt sense way: ie using mindfulness, brainspotting, or trauma release as a method for releasing body pain/tension caused by grief. ? ?KBarnie Del LCSW, LCAS, CCTP, CCS-I, BSP ? ? ? ? ? ? ? ? ? ?

## 2021-05-09 ENCOUNTER — Other Ambulatory Visit: Payer: Self-pay

## 2021-05-09 MED ORDER — COLCHICINE 0.6 MG PO TABS: ORAL_TABLET | ORAL | 0 refills | Status: DC

## 2021-05-10 ENCOUNTER — Other Ambulatory Visit: Payer: Self-pay

## 2021-05-10 DIAGNOSIS — M10071 Idiopathic gout, right ankle and foot: Secondary | ICD-10-CM

## 2021-05-10 MED ORDER — COLCHICINE 0.6 MG PO TABS: ORAL_TABLET | ORAL | 1 refills | Status: DC

## 2021-05-13 ENCOUNTER — Other Ambulatory Visit: Payer: Self-pay | Admitting: Internal Medicine

## 2021-05-13 ENCOUNTER — Ambulatory Visit (INDEPENDENT_AMBULATORY_CARE_PROVIDER_SITE_OTHER): Payer: Managed Care, Other (non HMO)

## 2021-05-13 ENCOUNTER — Other Ambulatory Visit: Payer: Self-pay

## 2021-05-13 DIAGNOSIS — M25472 Effusion, left ankle: Secondary | ICD-10-CM

## 2021-05-13 DIAGNOSIS — Z7901 Long term (current) use of anticoagulants: Secondary | ICD-10-CM

## 2021-05-13 DIAGNOSIS — I82452 Acute embolism and thrombosis of left peroneal vein: Secondary | ICD-10-CM

## 2021-05-13 DIAGNOSIS — M25471 Effusion, right ankle: Secondary | ICD-10-CM

## 2021-05-13 LAB — POCT INR: INR: 1.3 — AB (ref 2.0–3.0)

## 2021-05-13 NOTE — Patient Instructions (Signed)
TAKE 2 TABLETS TODAY AND Tuesday and then continue 1 tablet Daily, except 1.5 tablets on Monday and Wednesday.  INR 1 week. ? ?

## 2021-05-14 ENCOUNTER — Other Ambulatory Visit: Payer: Self-pay

## 2021-05-20 ENCOUNTER — Telehealth: Payer: Self-pay

## 2021-05-20 NOTE — Telephone Encounter (Signed)
?  Care Management  ? ?Follow Up Note ? ? ?05/20/2021 ?Name: Teresa Golden MRN: [id number] DOB: 05-30-1945 ? ? ?Referred by: Glendale Chard, MD ?Reason for referral : Care Coordination (Transportation/Counseling Referral) ? ? ?Inbound call received from the patient who inquires why she is unable to access [facility] transportation program. Advised the patient this program is no longer available unless there are acute needs. Confirmed the patient is still active with [facility] and has just renewed her eligibility through 2027. Discussed the patient will continue accessing [facility] for transportation needs. ? ?Patient reports she may want to switch counselors due to concerns with her current therapist having a service dog present during sessions. Patient reports she may want to participate in a group session versus individual. Encouraged the patient to speak with her current therapist about concerns and request a referral to a group session. Patient declined. Advised the patient SW will request her primary provider to follow up on this request.  ? ?Follow Up Plan: Patient will follow up with her primary care provider as needed regarding group therapy referral. ? ?Daneen Schick, BSW, CDP ?Social Worker, Certified Dementia Practitioner ?[facility] ?[phone] ? ?   ?  ? ? ? ?

## 2021-05-20 NOTE — Telephone Encounter (Signed)
Lpmtcb and reschedule INR appointment 

## 2021-05-23 ENCOUNTER — Ambulatory Visit (INDEPENDENT_AMBULATORY_CARE_PROVIDER_SITE_OTHER): Payer: Managed Care, Other (non HMO)

## 2021-05-23 DIAGNOSIS — M25471 Effusion, right ankle: Secondary | ICD-10-CM

## 2021-05-23 DIAGNOSIS — I82452 Acute embolism and thrombosis of left peroneal vein: Secondary | ICD-10-CM

## 2021-05-23 DIAGNOSIS — Z7901 Long term (current) use of anticoagulants: Secondary | ICD-10-CM

## 2021-05-23 DIAGNOSIS — M25472 Effusion, left ankle: Secondary | ICD-10-CM

## 2021-05-23 LAB — POCT INR: INR: 1.9 — AB (ref 2.0–3.0)

## 2021-05-23 NOTE — Patient Instructions (Signed)
TAKE 2 TABLETS TODAY and then increase to 1 tablet Daily, except 1.5 tablets on Monday, Wednesday, and Friday  INR 2 weeks. ?

## 2021-05-29 ENCOUNTER — Telehealth: Payer: Self-pay

## 2021-05-29 NOTE — Telephone Encounter (Signed)
CARE PLAN ENTRY ?(see longitudinal plan of care for additional care plan information) ? ?Current Barriers:  ?Financial Barriers: patient has [employer] and reports copay for [employer] is cost prohibitive at this time ? ?Pharmacist Clinical Goal(s):  ?Over the next 30 days, patient will work with PharmD and providers to relieve medication access concerns ? ?Interventions: ?Comprehensive medication review completed; medication list updated in electronic medical record.  ?Inter-disciplinary care team collaboration (see longitudinal plan of care) ?Patient to be started on Fiasp - 5 units per day with dinner.  ? ?Patient Self Care Activities:  ?Patient will provide necessary portions of application  ? ?Please see past updates related to this goal by clicking on the "Past Updates" button in the selected goal  ? ?

## 2021-06-03 ENCOUNTER — Other Ambulatory Visit: Payer: Self-pay

## 2021-06-03 ENCOUNTER — Telehealth: Payer: Self-pay

## 2021-06-03 NOTE — Telephone Encounter (Signed)
The pt was told to stop the insulin that Dr. Baird Cancer said to check her blood sugars daily and to send the readings on Tues and Thurs for the week. ?

## 2021-06-03 NOTE — Chronic Care Management (AMB) (Addendum)
I have reviewed the care management and care coordination activities outlined in this encounter and I am certifying that I agree with the content of this note. No further action required. ? ?Mayford Knife, RPH ?06/04/21 9:11 AM ? ? ? ?Chronic Care Management ?Pharmacy Assistant  ? ?Name: Teresa Golden  MRN: [id number] DOB: Dec 20, 1945 ? ?Reason for Encounter: Fiasp follow up  ? ?  ? ?Medications: ?Outpatient Encounter Medications as of 06/03/2021  ?Medication Sig  ? amLODipine (NORVASC) 5 MG tablet Take 1 tablet by mouth once daily  ? B Complex-C (B-COMPLEX WITH VITAMIN C) tablet Take 1 tablet by mouth 3 (three) times a week.  ? Biotin 10000 MCG TABS Take 10,000 mcg by mouth daily.  ? Cholecalciferol 5000 units TABS Take 5,000 Units by mouth daily.  ? colchicine 0.6 MG tablet Take 1 tablet by mouth for gout attack.  ? diclofenac Sodium (VOLTAREN) 1 % GEL APPLY 2 GRAMS TO AFFECTED AREA(S) 4 TIMES DAILY AS NEEDED (Patient taking differently: Apply 2-4 g topically every 6 (six) hours as needed (to affected areas).)  ? gabapentin (NEURONTIN) 300 MG capsule Take 600 mg by mouth at bedtime.  ? glucose blood (ONETOUCH VERIO) test strip USE TO CHECK BLOOD SUGAR   TWO TIMES A DAY AS         INSTRUCTED  ? Lancets (ONETOUCH DELICA PLUS [id number]) MISC USE TO CHECK BLOOD SUGARS  TWO TIMES A DAY AS         INSTRUCTED  ? losartan (COZAAR) 100 MG tablet TAKE 1 TABLET DAILY  ? metoprolol succinate (TOPROL-XL) 25 MG 24 hr tablet TAKE 1 TABLET AT BEDTIME   (DISCONTINUE COREG)  ? Multiple Vitamins-Minerals (MULTIVITAMIN WITH MINERALS) tablet Take 1 tablet by mouth daily. Women's 50+  ? Semaglutide, 1 MG/DOSE, (OZEMPIC, 1 MG/DOSE,) 4 MG/3ML SOPN Inject 1 mg into the skin once a week.  ? Semaglutide, 1 MG/DOSE, (OZEMPIC, 1 MG/DOSE,) 4 MG/3ML SOPN Inject 1 mg into the skin once a week.  ? simvastatin (ZOCOR) 10 MG tablet Take 1 tablet (10 mg total) by mouth daily. (Patient taking differently: Take 10 mg by mouth at bedtime.)  ? SYNTHROID  112 MCG tablet Take 112 mcg by mouth daily before breakfast.  ? traMADol (ULTRAM) 50 MG tablet Take 1 tablet (50 mg total) by mouth every 6 (six) hours as needed.  ? warfarin (COUMADIN) 5 MG tablet Take 1 tablet (5 mg total) by mouth daily.  ? [DISCONTINUED] SEMGLEE, YFGN, 100 UNIT/ML Pen Inject 9 Units into the skin at bedtime.  ? ?No facility-administered encounter medications on file as of 06/03/2021.  ? ?06-03-2021: Patient called this morning stating [name] started her on Fiasp 5 units daily and her sugar dropped to 68. She is confused about why medication was prescribed because Dr. Baird Cancer didn't inform her. She would like a call from [name] or Dr. Baird Cancer. Sent urgent message to [facility] and Dr. Baird Cancer with patient concerns.  ? ?06-04-2021: Called patient back to explain instructions, no answer, asked patient to call back.  ? ?Jeannette How CMA ?Clinical Pharmacist Assistant ?[phone] ? ?

## 2021-06-06 ENCOUNTER — Telehealth: Payer: Self-pay

## 2021-06-06 ENCOUNTER — Ambulatory Visit (INDEPENDENT_AMBULATORY_CARE_PROVIDER_SITE_OTHER): Payer: Medicare HMO

## 2021-06-06 ENCOUNTER — Telehealth: Payer: Medicare HMO

## 2021-06-06 DIAGNOSIS — E1122 Type 2 diabetes mellitus with diabetic chronic kidney disease: Secondary | ICD-10-CM

## 2021-06-06 DIAGNOSIS — F419 Anxiety disorder, unspecified: Secondary | ICD-10-CM

## 2021-06-06 DIAGNOSIS — I129 Hypertensive chronic kidney disease with stage 1 through stage 4 chronic kidney disease, or unspecified chronic kidney disease: Secondary | ICD-10-CM

## 2021-06-06 DIAGNOSIS — E66812 Obesity, class 2: Secondary | ICD-10-CM

## 2021-06-06 DIAGNOSIS — E039 Hypothyroidism, unspecified: Secondary | ICD-10-CM

## 2021-06-06 DIAGNOSIS — I82452 Acute embolism and thrombosis of left peroneal vein: Secondary | ICD-10-CM

## 2021-06-06 DIAGNOSIS — N1831 Chronic kidney disease, stage 3a: Secondary | ICD-10-CM

## 2021-06-06 NOTE — Telephone Encounter (Signed)
?  Care Management  ? ?Follow Up Note ? ? ?06/06/2021 ?Name: Teresa Golden MRN: [id number] DOB: September 21, 1945 ? ? ?Referred by: Glendale Chard, MD ?Reason for referral : Chronic Care Management (Inbound call from patient ) ? ? ?An unsuccessful telephone outreach was attempted today. The patient was referred to the case management team for assistance with care management and care coordination.  ? ?Follow Up Plan: A HIPPA compliant phone message was left for the patient providing contact information and requesting a return call.  ? ?Barb Merino, RN, BSN, CCM ?Care Management Coordinator ?[facility]  ?Direct Phone: [phone] ? ? ?

## 2021-06-06 NOTE — Progress Notes (Signed)
? ? ?Office Visit  ?  ?Patient Name: Teresa Golden ?Date of Encounter: 06/07/2021 ? ?Primary Care Provider:  Glendale Chard, MD ?Primary Cardiologist:  Elouise Munroe, MD ? ?Chief Complaint  ?  ?76 year old female with a history of abnormal EKG (RBBB, nonspecific T wave abnormalities), hypertension, DVT, CKD stage III, type 2 diabetes, hep C s/p Harvoni, hypothyroidism, and GERD who presents for follow-up related to hypertension. ? ?Past Medical History  ?  ?Past Medical History:  ?Diagnosis Date  ? Arthritis   ? Chronic kidney disease   ? self reports ckd stage 3   ? Depression   ? Diabetes mellitus, type II, insulin dependent ([place])   ? With neurologic complications. Bilateral lower extremity peripheral neuropathy  ? Dyspnea   ? with excertion; no issues now since weight loss surgery   ? Endometriosis   ? Essential hypertension   ? GERD (gastroesophageal reflux disease)   ? Hepatitis C   ? C dormant; states she is in remission since taking Harvoni   ? Hypertensive nephropathy 07/05/2018  ? Hypothyroidism   ? Hypothyroidism 02/06/2018  ? Morbid obesity with BMI of 50.0-59.9, adult ([place])   ? Scoliosis   ? ?Past Surgical History:  ?Procedure Laterality Date  ? ABDOMINAL HYSTERECTOMY    ? uterus  ? CHOLECYSTECTOMY    ? DIAGNOSTIC LAPAROSCOPY    ? DILATION AND CURETTAGE OF UTERUS    ? EYE SURGERY    ? cataract extraction bilateral   ? INCISION AND DRAINAGE OF WOUND N/A 10/15/2020  ? Procedure: IRRIGATION AND DEBRIDEMENT WOUND;  Surgeon: Wallace Going, DO;  Location: [facility];  Service: Plastics;  Laterality: N/A;  60 min  ? JOINT REPLACEMENT    ? Left knee  ? KNEE ARTHROPLASTY Right 07/30/2017  ? Procedure: RIGHT TOTAL KNEE ARTHROPLASTY WITH COMPUTER NAVIGATION;  Surgeon: Rod Can, MD;  Location: [facility];  Service: Orthopedics;  Laterality: Right;  Needs RNFA  ? LAPAROSCOPIC GASTRIC BANDING    ? and reversal  ? LAPAROSCOPIC GASTRIC SLEEVE RESECTION N/A 08/26/2016  ? Procedure: LAPAROSCOPIC GASTRIC SLEEVE  RESECTION WITH UPPER ENOD;  Surgeon: Johnathan Hausen, MD;  Location: [facility];  Service: General;  Laterality: N/A;  ? PANNICULECTOMY N/A 09/26/2020  ? Procedure: PANNICULECTOMY;  Surgeon: Wallace Going, DO;  Location: [facility];  Service: Plastics;  Laterality: N/A;  ? TONSILLECTOMY    ? TRANSTHORACIC ECHOCARDIOGRAM  11/2013  ? EF 65-70%. Normal diastolic Fxn.  Normal Valves.  ? UMBILICAL HERNIA REPAIR N/A 09/26/2020  ? Procedure: HERNIA REPAIR UMBILICAL ADULT;  Surgeon: Georganna Skeans, MD;  Location: [facility];  Service: General;  Laterality: N/A;  ? ? ?Allergies ? ?Allergies  ?Allergen Reactions  ? Meloxicam Other (See Comments)  ?  Fever; muscle aches; "flu-like" symptoms ?"Allergic," per MAR  ? Other Other (See Comments)  ?  "Rose fever and hay fever"  ? ? ?History of Present Illness  ?  ?76 year old female with the above past medical history including abnormal EKG (RBBB, nonspecific T wave abnormalities), hypertension, DVT, CKD stage III, type 2 diabetes, hep C s/p Harvoni, hypothyroidism, and GERD. ? ?She was previously followed by Dr. Ellyn Hack.  Lexiscan Myoview in 2018 was low risk.  She re-established care with Dr. Margaretann Loveless in March 2022.  He was referred to our office by her PCP in the setting of abnormal EKG that showed RBBB pattern.  Follow-up EKG showed nonspecific T wave abnormalities.  She was asymptomatic.  Echocardiogram in April 2022 showed EF  60 to 65%, no RWMA, mild LVH, G1 DD, mild aortic valve sclerosis, without evidence of aortic valve stenosis. She was last seen virtually for preop evaluation on 06/20/2020 and was stable from a cardiac standpoint.  She was diagnosed with a DVT in January 2023, and is on Coumadin (she was referred to hematology/oncology as she did not respond to Xarelto). ? ?She presents today for follow-up. Since her last visit was done well from a cardiac standpoint.  She denies any symptoms concerning for angina. Overall, she reports feeling well denies any specific concerns  today. ? ?Home Medications  ?  ?Current Outpatient Medications  ?Medication Sig Dispense Refill  ? amLODipine (NORVASC) 5 MG tablet Take 1 tablet by mouth once daily 90 tablet 0  ? B Complex-C (B-COMPLEX WITH VITAMIN C) tablet Take 1 tablet by mouth 3 (three) times a week.    ? Biotin 10000 MCG TABS Take 10,000 mcg by mouth daily.    ? Cholecalciferol 5000 units TABS Take 5,000 Units by mouth daily.    ? colchicine 0.6 MG tablet Take 1 tablet by mouth for gout attack. 30 tablet 1  ? diclofenac Sodium (VOLTAREN) 1 % GEL APPLY 2 GRAMS TO AFFECTED AREA(S) 4 TIMES DAILY AS NEEDED (Patient taking differently: Apply 2-4 g topically every 6 (six) hours as needed (to affected areas).) 200 g 2  ? gabapentin (NEURONTIN) 300 MG capsule Take 600 mg by mouth at bedtime.    ? glucose blood (ONETOUCH VERIO) test strip USE TO CHECK BLOOD SUGAR   TWO TIMES A DAY AS         INSTRUCTED 200 strip 3  ? Lancets (ONETOUCH DELICA PLUS [id number]) MISC USE TO CHECK BLOOD SUGARS  TWO TIMES A DAY AS         INSTRUCTED 200 each 3  ? losartan (COZAAR) 100 MG tablet TAKE 1 TABLET DAILY 90 tablet 2  ? metoprolol succinate (TOPROL-XL) 25 MG 24 hr tablet TAKE 1 TABLET AT BEDTIME   (DISCONTINUE COREG) 90 tablet 1  ? Multiple Vitamins-Minerals (MULTIVITAMIN WITH MINERALS) tablet Take 1 tablet by mouth daily. Women's 50+    ? Semaglutide, 1 MG/DOSE, (OZEMPIC, 1 MG/DOSE,) 4 MG/3ML SOPN Inject 1 mg into the skin once a week. 9 mL 1  ? Semaglutide, 1 MG/DOSE, (OZEMPIC, 1 MG/DOSE,) 4 MG/3ML SOPN Inject 1 mg into the skin once a week. 9 mL 1  ? simvastatin (ZOCOR) 10 MG tablet Take 1 tablet (10 mg total) by mouth daily. (Patient taking differently: Take 10 mg by mouth at bedtime.) 90 tablet 1  ? SYNTHROID 112 MCG tablet Take 112 mcg by mouth daily before breakfast.    ? traMADol (ULTRAM) 50 MG tablet Take 1 tablet (50 mg total) by mouth every 6 (six) hours as needed. 20 tablet 0  ? warfarin (COUMADIN) 5 MG tablet Take 1 tablet (5 mg total) by mouth daily.  60 tablet 1  ? ?No current facility-administered medications for this visit.  ?  ? ?Review of Systems  ?  ?She denies chest pain, palpitations, dyspnea, pnd, orthopnea, n, v, dizziness, syncope, edema, weight gain, or early satiety. All other systems reviewed and are otherwise negative except as noted above.  ? ?Physical Exam  ?  ?VS:  BP 122/68   Pulse (!) 56   Ht '5\' 5"'$  (1.651 m)   Wt 198 lb 12.8 oz (90.2 kg)   SpO2 99%   BMI 33.08 kg/m?  ?GEN: Well nourished, well developed, in no acute  distress. ?HEENT: normal. ?Neck: Supple, no JVD, carotid bruits, or masses. ?Cardiac: RRR, no murmurs, rubs, or gallops. No clubbing, cyanosis, edema.  Radials/DP/PT 2+ and equal bilaterally.  ?Respiratory:  Respirations regular and unlabored, clear to auscultation bilaterally. ?GI: Soft, nontender, nondistended, BS + x 4. ?MS: no deformity or atrophy. ?Skin: warm and dry, no rash. ?Neuro:  Strength and sensation are intact. ?Psych: Normal affect. ? ?Accessory Clinical Findings  ?  ?ECG personally reviewed by me today -Sinus bradycardia, 56 bpm, nonspecific ST/T wave changes- no acute changes. ? ?Lab Results  ?Component Value Date  ? WBC 8.2 03/28/2021  ? HGB 11.7 (L) 03/28/2021  ? HCT 35.0 (L) 03/28/2021  ? MCV 81.0 03/28/2021  ? PLT 228 03/28/2021  ? ?Lab Results  ?Component Value Date  ? CREATININE 1.02 (H) 03/28/2021  ? BUN 15 03/28/2021  ? NA 140 03/28/2021  ? K 3.9 03/28/2021  ? CL 107 03/28/2021  ? CO2 25 03/28/2021  ? ?Lab Results  ?Component Value Date  ? ALT 19 03/28/2021  ? AST 25 03/28/2021  ? ALKPHOS 81 03/28/2021  ? BILITOT 0.5 03/28/2021  ? ?Lab Results  ?Component Value Date  ? CHOL 158 02/27/2021  ? HDL 73 02/27/2021  ? [place] 74 02/27/2021  ? TRIG 50 02/27/2021  ? CHOLHDL 2.2 02/27/2021  ?  ?Lab Results  ?Component Value Date  ? HGBA1C 6.5 (H) 02/27/2021  ? ? ?Assessment & Plan  ?  ?1. H/o abnormal EKG: She showed right bundle blanch block pattern, nonspecific T wave abnormalities. EKG today shows sinus  bradycardia, 56 bpm, nonspecific ST/T wave change. Echo in April 2022 showed EF 60 to 65%, no RWMA, mild LVH, G1 DD, mild aortic valve sclerosis, without evidence of aortic valve stenosis. Stable with no anginal symptom

## 2021-06-06 NOTE — Chronic Care Management (AMB) (Signed)
?Chronic Care Management  ? ?CCM RN Visit Note ? ?06/06/2021 ?Name: Teresa Golden MRN: [id number] DOB: 1945/12/24 ? ?Subjective: ?Teresa Golden is a 76 y.o. year old female who is a primary care patient of Glendale Chard, MD. The care management team was consulted for assistance with disease management and care coordination needs.   ? ?Engaged with patient by telephone for follow up visit in response to provider referral for case management and/or care coordination services.  ? ?Consent to Services:  ?The patient was given information about Chronic Care Management services, agreed to services, and gave verbal consent prior to initiation of services.  Please see initial visit note for detailed documentation.  ? ?Patient agreed to services and verbal consent obtained.  ? ?Assessment: Review of patient past medical history, allergies, medications, health status, including review of consultants reports, laboratory and other test data, was performed as part of comprehensive evaluation and provision of chronic care management services.  ? ?SDOH (Social Determinants of Health) assessments and interventions performed:   ? ?CCM Care Plan ? ?Allergies  ?Allergen Reactions  ? Meloxicam Other (See Comments)  ?  Fever; muscle aches; "flu-like" symptoms ?"Allergic," per MAR  ? Other Other (See Comments)  ?  "Rose fever and hay fever"  ? ? ?Outpatient Encounter Medications as of 06/06/2021  ?Medication Sig  ? amLODipine (NORVASC) 5 MG tablet Take 1 tablet by mouth once daily  ? B Complex-C (B-COMPLEX WITH VITAMIN C) tablet Take 1 tablet by mouth 3 (three) times a week.  ? Biotin 10000 MCG TABS Take 10,000 mcg by mouth daily.  ? Cholecalciferol 5000 units TABS Take 5,000 Units by mouth daily.  ? colchicine 0.6 MG tablet Take 1 tablet by mouth for gout attack.  ? diclofenac Sodium (VOLTAREN) 1 % GEL APPLY 2 GRAMS TO AFFECTED AREA(S) 4 TIMES DAILY AS NEEDED (Patient taking differently: Apply 2-4 g topically every 6 (six) hours as needed  (to affected areas).)  ? gabapentin (NEURONTIN) 300 MG capsule Take 600 mg by mouth at bedtime.  ? glucose blood (ONETOUCH VERIO) test strip USE TO CHECK BLOOD SUGAR   TWO TIMES A DAY AS         INSTRUCTED  ? Lancets (ONETOUCH DELICA PLUS [id number]) MISC USE TO CHECK BLOOD SUGARS  TWO TIMES A DAY AS         INSTRUCTED  ? losartan (COZAAR) 100 MG tablet TAKE 1 TABLET DAILY  ? metoprolol succinate (TOPROL-XL) 25 MG 24 hr tablet TAKE 1 TABLET AT BEDTIME   (DISCONTINUE COREG)  ? Multiple Vitamins-Minerals (MULTIVITAMIN WITH MINERALS) tablet Take 1 tablet by mouth daily. Women's 50+  ? Semaglutide, 1 MG/DOSE, (OZEMPIC, 1 MG/DOSE,) 4 MG/3ML SOPN Inject 1 mg into the skin once a week.  ? Semaglutide, 1 MG/DOSE, (OZEMPIC, 1 MG/DOSE,) 4 MG/3ML SOPN Inject 1 mg into the skin once a week.  ? simvastatin (ZOCOR) 10 MG tablet Take 1 tablet (10 mg total) by mouth daily. (Patient taking differently: Take 10 mg by mouth at bedtime.)  ? SYNTHROID 112 MCG tablet Take 112 mcg by mouth daily before breakfast.  ? traMADol (ULTRAM) 50 MG tablet Take 1 tablet (50 mg total) by mouth every 6 (six) hours as needed.  ? warfarin (COUMADIN) 5 MG tablet Take 1 tablet (5 mg total) by mouth daily.  ? [DISCONTINUED] SEMGLEE, YFGN, 100 UNIT/ML Pen Inject 9 Units into the skin at bedtime.  ? ?No facility-administered encounter medications on file as of 06/06/2021.  ? ? ?  Patient Active Problem List  ? Diagnosis Date Noted  ? Long term (current) use of anticoagulants 04/05/2021  ? Acute venous embolism and thrombosis of deep vessels of proximal lower extremity ([place]) 04/05/2021  ? Infected wound 11/02/2020  ? Pressure injury of skin 10/22/2020  ? Wound infection after surgery 10/12/2020  ? Normocytic anemia 10/12/2020  ? AKI (acute kidney injury) ([place]) 10/12/2020  ? Obesity (BMI 30-39.9) 10/12/2020  ? Sepsis ([place]) 10/12/2020  ? Type 2 diabetes mellitus with stage 3 chronic kidney disease, with long-term current use of insulin ([place]) 11/29/2019  ?  Panniculitis 09/30/2019  ? Back pain 09/30/2019  ? Hepatoma ([place]) 01/21/2019  ? Other cirrhosis of liver ([place]) 07/05/2018  ? Hypertensive nephropathy 07/05/2018  ? Chronic renal disease, stage III ([place]) 07/05/2018  ? Chronic left shoulder pain 07/05/2018  ? Hypothyroidism 02/06/2018  ? Hepatitis C virus infection cured after antiviral drug therapy 02/04/2018  ? Osteoarthritis of right knee 07/30/2017  ? S/P laparoscopic sleeve gastrectomy July 2018 08/26/2016  ? Preop cardiovascular exam 07/30/2016  ? Dyspnea on exertion 07/30/2016  ? Bilateral lower extremity edema 07/30/2016  ? ? ?Conditions to be addressed/monitored: DM, CKD III, Hypertensive Nephropathy, Class 2 severe obesity, Anxiety, Panniculitis, localized swelling of lower extremity ? ?Care Plan : RN Care Manager Plan of Care  ?Updates made by Teresa Logan, RN since 06/06/2021 12:00 AM  ?  ? ?Problem: No plan of care established for management of chronic disease states (DM, CKD III, Hypertensive Nephropathy, Class 2 severe obesity, Anxiety, Panniculitis, localized swelling of lower extremity)   ?Priority: High  ?  ? ?Long-Range Goal: Development of plan for management of chronic disease management for DM, CKD III, Hypertensive Nephropathy, Class 2 severe obesity, Anxiety, Panniculitis, localized swelling of lower extremity   ?Start Date: 12/31/2020  ?Expected End Date: 12/31/2021  ?Recent Progress: On track  ?Priority: High  ?Note:   ?Current Barriers:  ?Knowledge Deficits related to plan of care for management of DM, CKD III, Hypertensive Nephropathy, Class 2 severe obesity, Anxiety, Panniculitis, localized swelling of lower extremity ?Chronic Disease Management support and education needs related to DM, CKD III, Hypertensive Nephropathy, Class 2 severe obesity, Anxiety, Panniculitis, localized swelling of lower extremity ? ?RNCM Clinical Goal(s):  ?Patient will verbalize basic understanding of  DM, CKD III, Hypertensive Nephropathy, Class 2 severe  obesity, Anxiety, Panniculitis, localized swelling of lower extremity disease process and self health management plan   ?demonstrate Improved health management independence   ?continue to work with RN Care Manager to address care management and care coordination needs related to  DM, CKD III, Hypertensive Nephropathy, Class 2 severe obesity, Anxiety, Panniculitis, localized swelling of lower extremity ?will demonstrate ongoing self health care management ability    through collaboration with RN Care manager, provider, and care team.  ?Interventions: ?1:1 collaboration with primary care provider regarding development and update of comprehensive plan of care as evidenced by provider attestation and co-signature ?Inter-disciplinary care team collaboration (see longitudinal plan of care) ?Evaluation of current treatment plan related to  self management and patient's adherence to plan as established by provider ? ?Acute symptoms of illness following Shingrix vaccine:  (Status:  New goal.)  Short Term Goal ?Completed inbound call with patient ?Determined Teresa Golden received her Shingles vaccine x 1 day ago and today she is feeling fatigued and has a low grade fever of 99.3 ?Determined patient spoke with her pharmacist who advised these symptoms are normal following the vaccine, however she wanted validation that  she was given the correct information ?Advised patient the symptoms she is experiencing are normal following this vaccine and should resolve over 1-3 days ?Instructed patient to stay well hydrated and to take Acetaminophen as directed by her PCP balancing her activity with plenty of rest ?Encouraged patient to keep her PCP informed of new or worsening symptoms and or seek medical attention if needed if symptoms worsen over the weekend and her PCP is not available, patient verbalizes understanding   ? ?Patient Goals/Self-Care Activities: ?Take all medications as prescribed ?Attend all scheduled provider  appointments ?Call pharmacy for medication refills 3-7 days in advance of running out of medications ?Perform all self care activities independently  ?Perform IADL's (shopping, preparing meals, housekeeping, managing finances)

## 2021-06-07 ENCOUNTER — Ambulatory Visit (INDEPENDENT_AMBULATORY_CARE_PROVIDER_SITE_OTHER): Payer: Medicare HMO

## 2021-06-07 ENCOUNTER — Encounter: Payer: Self-pay | Admitting: Nurse Practitioner

## 2021-06-07 ENCOUNTER — Ambulatory Visit (INDEPENDENT_AMBULATORY_CARE_PROVIDER_SITE_OTHER): Payer: Medicare HMO | Admitting: Nurse Practitioner

## 2021-06-07 VITALS — BP 122/68 | HR 56 | Ht 65.0 in | Wt 198.8 lb

## 2021-06-07 DIAGNOSIS — I10 Essential (primary) hypertension: Secondary | ICD-10-CM | POA: Diagnosis not present

## 2021-06-07 DIAGNOSIS — E782 Mixed hyperlipidemia: Secondary | ICD-10-CM

## 2021-06-07 DIAGNOSIS — Z794 Long term (current) use of insulin: Secondary | ICD-10-CM

## 2021-06-07 DIAGNOSIS — R9431 Abnormal electrocardiogram [ECG] [EKG]: Secondary | ICD-10-CM

## 2021-06-07 DIAGNOSIS — M25471 Effusion, right ankle: Secondary | ICD-10-CM

## 2021-06-07 DIAGNOSIS — N183 Chronic kidney disease, stage 3 unspecified: Secondary | ICD-10-CM

## 2021-06-07 DIAGNOSIS — I82452 Acute embolism and thrombosis of left peroneal vein: Secondary | ICD-10-CM

## 2021-06-07 DIAGNOSIS — M25472 Effusion, left ankle: Secondary | ICD-10-CM

## 2021-06-07 DIAGNOSIS — Z7901 Long term (current) use of anticoagulants: Secondary | ICD-10-CM

## 2021-06-07 DIAGNOSIS — E1122 Type 2 diabetes mellitus with diabetic chronic kidney disease: Secondary | ICD-10-CM

## 2021-06-07 LAB — POCT INR: INR: 2.4 (ref 2.0–3.0)

## 2021-06-07 NOTE — Patient Instructions (Signed)
Medication Instructions:  ?The current medical regimen is effective;  continue present plan and medications as directed. Please refer to the Current Medication list given to you today.  ? ?*If you need a refill on your cardiac medications before your next appointment, please call your pharmacy* ? ?Lab Work:   Testing/Procedures:  ?NONE    NONE ? ?Follow-Up: ?Your next appointment:  12 month(s) In Person with Elouise Munroe, MD   ? ?Please call our office 2 months in advance to schedule this appointment  :1 ? ?At [facility], you and your health needs are our priority.  As part of our continuing mission to provide you with exceptional heart care, we have created designated Provider Care Teams.  These Care Teams include your primary Cardiologist (physician) and Advanced Practice Providers (APPs -  Physician Assistants and Nurse Practitioners) who all work together to provide you with the care you need, when you need it. ? ?We recommend signing up for the patient portal called "MyChart".  Sign up information is provided on this After Visit Summary.  MyChart is used to connect with patients for Virtual Visits (Telemedicine).  Patients are able to view lab/test results, encounter notes, upcoming appointments, etc.  Non-urgent messages can be sent to your provider as well.   ?To learn more about what you can do with MyChart, go to [email or web address].   ? ? ? ?     ?

## 2021-06-07 NOTE — Patient Instructions (Signed)
Continue 1 tablet Daily, except 1.5 tablets on Monday, Wednesday, and Friday  INR 6 weeks. ? ?

## 2021-06-10 ENCOUNTER — Ambulatory Visit: Payer: Medicare HMO | Admitting: Internal Medicine

## 2021-06-12 ENCOUNTER — Ambulatory Visit: Payer: Self-pay

## 2021-06-12 ENCOUNTER — Telehealth: Payer: Medicare HMO

## 2021-06-12 DIAGNOSIS — M793 Panniculitis, unspecified: Secondary | ICD-10-CM

## 2021-06-12 DIAGNOSIS — I82452 Acute embolism and thrombosis of left peroneal vein: Secondary | ICD-10-CM

## 2021-06-12 DIAGNOSIS — F419 Anxiety disorder, unspecified: Secondary | ICD-10-CM

## 2021-06-12 DIAGNOSIS — N182 Chronic kidney disease, stage 2 (mild): Secondary | ICD-10-CM

## 2021-06-12 DIAGNOSIS — I129 Hypertensive chronic kidney disease with stage 1 through stage 4 chronic kidney disease, or unspecified chronic kidney disease: Secondary | ICD-10-CM

## 2021-06-12 DIAGNOSIS — N1831 Chronic kidney disease, stage 3a: Secondary | ICD-10-CM

## 2021-06-13 NOTE — Patient Instructions (Signed)
Visit Information ? ?Thank you for taking time to visit with me today. Please don't hesitate to contact me if I can be of assistance to you before our next scheduled telephone appointment. ? ?Following are the goals we discussed today:  ?(Copy and paste patient goals from clinical care plan here) ? ?Our next appointment is by telephone on 10/14/21 at 10:30 AM ? ?Please call the care guide team at [phone] if you need to cancel or reschedule your appointment.  ? ?If you are experiencing a Mental Health or [place] or need someone to talk to, please call [phone]-TALK (toll free, 24 hour hotline)  ? ?Patient verbalizes understanding of instructions and care plan provided today and agrees to view in [place]. Active MyChart status confirmed with patient.   ? ?Barb Merino, RN, BSN, CCM ?Care Management Coordinator ?[facility]  ?Direct Phone: [phone] ? ? ?

## 2021-06-13 NOTE — Chronic Care Management (AMB) (Signed)
?Chronic Care Management  ? ?CCM RN Visit Note ? ?06/12/2021 ?Name: Teresa Golden MRN: [id number] DOB: [birth year] ? ?Subjective: ?Teresa Golden is a 76 y.o. year old female who is a primary care patient of Glendale Chard, MD. The care management team was consulted for assistance with disease management and care coordination needs.   ? ?Engaged with patient by telephone for follow up visit in response to provider referral for case management and/or care coordination services.  ? ?Consent to Services:  ?The patient was given information about Chronic Care Management services, agreed to services, and gave verbal consent prior to initiation of services.  Please see initial visit note for detailed documentation.  ? ?Patient agreed to services and verbal consent obtained.  ? ?Assessment: Review of patient past medical history, allergies, medications, health status, including review of consultants reports, laboratory and other test data, was performed as part of comprehensive evaluation and provision of chronic care management services.  ? ?SDOH (Social Determinants of Health) assessments and interventions performed:  Yes, no acute challenges ? ?CCM Care Plan ? ?Allergies  ?Allergen Reactions  ? Meloxicam Other (See Comments)  ?  Fever; muscle aches; "flu-like" symptoms ?"Allergic," per MAR  ? Other Other (See Comments)  ?  "Rose fever and hay fever"  ? ? ?Outpatient Encounter Medications as of 06/12/2021  ?Medication Sig  ? Insulin Degludec (TRESIBA) 100 UNIT/ML SOLN Inject 8 Units/oz/day into the skin. Patient is taking at bedtime  ? amLODipine (NORVASC) 5 MG tablet Take 1 tablet by mouth once daily  ? B Complex-C (B-COMPLEX WITH VITAMIN C) tablet Take 1 tablet by mouth 3 (three) times a week.  ? Biotin 10000 MCG TABS Take 10,000 mcg by mouth daily.  ? Cholecalciferol 5000 units TABS Take 5,000 Units by mouth daily.  ? colchicine 0.6 MG tablet Take 1 tablet by mouth for gout attack.  ? diclofenac Sodium (VOLTAREN) 1 % GEL  APPLY 2 GRAMS TO AFFECTED AREA(S) 4 TIMES DAILY AS NEEDED (Patient taking differently: Apply 2-4 g topically every 6 (six) hours as needed (to affected areas).)  ? gabapentin (NEURONTIN) 300 MG capsule Take 600 mg by mouth at bedtime.  ? glucose blood (ONETOUCH VERIO) test strip USE TO CHECK BLOOD SUGAR   TWO TIMES A DAY AS         INSTRUCTED  ? Lancets (ONETOUCH DELICA PLUS [id number]) MISC USE TO CHECK BLOOD SUGARS  TWO TIMES A DAY AS         INSTRUCTED  ? losartan (COZAAR) 100 MG tablet TAKE 1 TABLET DAILY  ? metoprolol succinate (TOPROL-XL) 25 MG 24 hr tablet TAKE 1 TABLET AT BEDTIME   (DISCONTINUE COREG)  ? Multiple Vitamins-Minerals (MULTIVITAMIN WITH MINERALS) tablet Take 1 tablet by mouth daily. Women's 50+  ? Semaglutide, 1 MG/DOSE, (OZEMPIC, 1 MG/DOSE,) 4 MG/3ML SOPN Inject 1 mg into the skin once a week.  ? Semaglutide, 1 MG/DOSE, (OZEMPIC, 1 MG/DOSE,) 4 MG/3ML SOPN Inject 1 mg into the skin once a week.  ? simvastatin (ZOCOR) 10 MG tablet Take 1 tablet (10 mg total) by mouth daily. (Patient taking differently: Take 10 mg by mouth at bedtime.)  ? SYNTHROID 112 MCG tablet Take 112 mcg by mouth daily before breakfast.  ? traMADol (ULTRAM) 50 MG tablet Take 1 tablet (50 mg total) by mouth every 6 (six) hours as needed.  ? warfarin (COUMADIN) 5 MG tablet Take 1 tablet (5 mg total) by mouth daily.  ? [DISCONTINUED] SEMGLEE, YFGN, 100 UNIT/ML Pen  Inject 9 Units into the skin at bedtime.  ? ?No facility-administered encounter medications on file as of 06/12/2021.  ? ? ?Patient Active Problem List  ? Diagnosis Date Noted  ? Long term (current) use of anticoagulants 04/05/2021  ? Acute venous embolism and thrombosis of deep vessels of proximal lower extremity ([place]) 04/05/2021  ? Infected wound 11/02/2020  ? Pressure injury of skin 10/22/2020  ? Wound infection after surgery 10/12/2020  ? Normocytic anemia 10/12/2020  ? AKI (acute kidney injury) ([place]) 10/12/2020  ? Obesity (BMI 30-39.9) 10/12/2020  ? Sepsis ([place])  10/12/2020  ? Type 2 diabetes mellitus with stage 3 chronic kidney disease, with long-term current use of insulin ([place]) 11/29/2019  ? Panniculitis 09/30/2019  ? Back pain 09/30/2019  ? Hepatoma ([place]) 01/21/2019  ? Other cirrhosis of liver ([place]) 07/05/2018  ? Hypertensive nephropathy 07/05/2018  ? Chronic renal disease, stage III ([place]) 07/05/2018  ? Chronic left shoulder pain 07/05/2018  ? Hypothyroidism 02/06/2018  ? Hepatitis C virus infection cured after antiviral drug therapy 02/04/2018  ? Osteoarthritis of right knee 07/30/2017  ? S/P laparoscopic sleeve gastrectomy July 2018 08/26/2016  ? Preop cardiovascular exam 07/30/2016  ? Dyspnea on exertion 07/30/2016  ? Bilateral lower extremity edema 07/30/2016  ? ? ?Conditions to be addressed/monitored: DM, CKD III, Hypertensive Nephropathy, Class 2 severe obesity, Anxiety, Panniculitis, localized swelling of lower extremity ? ?Care Plan : RN Care Manager Plan of Care  ?Updates made by Teresa Logan, RN since 06/12/2021 12:00 AM  ?  ? ?Problem: No plan of care established for management of chronic disease states (DM, CKD III, Hypertensive Nephropathy, Class 2 severe obesity, Anxiety, Panniculitis, localized swelling of lower extremity)   ?Priority: High  ?  ? ?Long-Range Goal: Development of plan for management of chronic disease management for DM, CKD III, Hypertensive Nephropathy, Class 2 severe obesity, Anxiety, Panniculitis, localized swelling of lower extremity   ?Start Date: 12/31/2020  ?Expected End Date: 12/31/2021  ?Recent Progress: On track  ?Priority: High  ?Note:   ?Current Barriers:  ?Knowledge Deficits related to plan of care for management of DM, CKD III, Hypertensive Nephropathy, Class 2 severe obesity, Anxiety, Panniculitis, localized swelling of lower extremity ?Chronic Disease Management support and education needs related to DM, CKD III, Hypertensive Nephropathy, Class 2 severe obesity, Anxiety, Panniculitis, localized swelling of lower  extremity ? ?RNCM Clinical Goal(s):  ?Patient will verbalize basic understanding of  DM, CKD III, Hypertensive Nephropathy, Class 2 severe obesity, Anxiety, Panniculitis, localized swelling of lower extremity disease process and self health management plan   ?demonstrate Improved health management independence   ?continue to work with RN Care Manager to address care management and care coordination needs related to  DM, CKD III, Hypertensive Nephropathy, Class 2 severe obesity, Anxiety, Panniculitis, localized swelling of lower extremity ?will demonstrate ongoing self health care management ability    through collaboration with RN Care manager, provider, and care team.  ?Interventions: ?1:1 collaboration with primary care provider regarding development and update of comprehensive plan of care as evidenced by provider attestation and co-signature ?Inter-disciplinary care team collaboration (see longitudinal plan of care) ?Evaluation of current treatment plan related to  self management and patient's adherence to plan as established by provider ? ?Hypertension Interventions:  (Status:  Condition stable.  Not addressed this visit.) Long Term Goal ?Last practice recorded BP readings:  ?BP Readings from Last 3 Encounters:  ?02/27/21 130/70  ?01/16/21 (!) 154/80  ?12/25/20 120/68  ?Most recent eGFR/CrCl:  ?Lab Results  ?  Component Value Date  ? EGFR 52 (L) 02/27/2021  ?  No components found for: CRCL ?Evaluation of current treatment plan related to hypertension self management and patient's adherence to plan as established by provider ?Reviewed medications with patient and discussed importance of compliance ?Counseled on the importance of exercise goals with target of 150 minutes per week ?Advised patient, providing education and rationale, to monitor blood pressure daily and record, calling PCP for findings outside established parameters ?Provided education on prescribed diet low Sodium   ?Discussed plans with patient for  ongoing care management follow up and provided patient with direct contact information for care management team ? ?Abdominal Wound Interventions: (Status: Goal Met.) Short Term Goal  ?Evaluation of current treatment

## 2021-06-17 ENCOUNTER — Telehealth: Payer: Self-pay

## 2021-06-17 NOTE — Telephone Encounter (Signed)
Pt called in and is wondering if the coumadin is making her unusually tired. I will route to pharmd pool.  ?

## 2021-06-17 NOTE — Telephone Encounter (Signed)
Spoke with patient who reports she has started napping during the day which is unusual for her.  Was not sure if it was due to warfarin since her dose has been increased.  No other symptoms  However she also reports that she has been busier recently. Advised it is more likely she is tired due to her stressful last few months and being more active during the day but for her to keep [place] posted. Patient voiced understanding. ?

## 2021-06-23 DIAGNOSIS — Z794 Long term (current) use of insulin: Secondary | ICD-10-CM

## 2021-06-23 DIAGNOSIS — N182 Chronic kidney disease, stage 2 (mild): Secondary | ICD-10-CM

## 2021-06-23 DIAGNOSIS — E1122 Type 2 diabetes mellitus with diabetic chronic kidney disease: Secondary | ICD-10-CM | POA: Diagnosis not present

## 2021-06-23 DIAGNOSIS — I129 Hypertensive chronic kidney disease with stage 1 through stage 4 chronic kidney disease, or unspecified chronic kidney disease: Secondary | ICD-10-CM

## 2021-06-23 DIAGNOSIS — E039 Hypothyroidism, unspecified: Secondary | ICD-10-CM | POA: Diagnosis not present

## 2021-06-23 DIAGNOSIS — N1831 Chronic kidney disease, stage 3a: Secondary | ICD-10-CM

## 2021-06-24 ENCOUNTER — Telehealth: Payer: Self-pay

## 2021-06-24 NOTE — Chronic Care Management (AMB) (Signed)
? ? ?  Teresa Golden was reminded to have all medications, supplements and any blood glucose and blood pressure readings available for review with [name], Pharm. D, at her telephone visit on 06-25-2021 at 2:00. ? ? ?Malecca Hicks CMA ?Clinical Pharmacist Assistant ?[phone] ? ? ?

## 2021-06-25 ENCOUNTER — Other Ambulatory Visit: Payer: Self-pay

## 2021-06-25 ENCOUNTER — Ambulatory Visit (INDEPENDENT_AMBULATORY_CARE_PROVIDER_SITE_OTHER): Payer: Medicare HMO

## 2021-06-25 DIAGNOSIS — E1122 Type 2 diabetes mellitus with diabetic chronic kidney disease: Secondary | ICD-10-CM

## 2021-06-25 DIAGNOSIS — Z794 Long term (current) use of insulin: Secondary | ICD-10-CM

## 2021-06-25 MED ORDER — SIMVASTATIN 10 MG PO TABS: 10.0000 mg | ORAL_TABLET | Freq: Every day | ORAL | 1 refills | Status: DC

## 2021-06-25 NOTE — Progress Notes (Addendum)
? ?Chronic Care Management ?Pharmacy Note ? ?06/26/2021 ?Name:  Montserrath L Whittenberg MRN:  [id number] DOB:  12/22/1945 ? ?Summary: ?Patient reports that she is doing well with her current medications  ? ?Recommendations/Changes made from today's visit: ?Recommend patient continue current medication regimen  ? ?Plan: ?Patient to have COVID-19 Booster vaccine added to her chart ? ? ?Subjective: ?Zacari L Lamontagne is an 76 y.o. year old female who is a primary patient of Sanders, Robyn, MD.  The CCM team was consulted for assistance with disease management and care coordination needs.   ? ?Engaged with patient by telephone for follow up visit in response to provider referral for pharmacy case management and/or care coordination services.  ? ?Consent to Services:  ?The patient was given information about Chronic Care Management services, agreed to services, and gave verbal consent prior to initiation of services.  Please see initial visit note for detailed documentation.  ? ?Patient Care Team: ?Sanders, Robyn, MD as PCP - General (Internal Medicine) ?Acharya, Gayatri A, MD as PCP - Cardiology (Cardiology) ?Little, Angel L, RN as Case Manager ?,  J, RPH (Pharmacist) ? ? ? ?Hospital visits: ?None in previous 6 months ? ? ?Objective: ? ?Lab Results  ?Component Value Date  ? CREATININE 1.02 (H) 03/28/2021  ? BUN 15 03/28/2021  ? EGFR 52 (L) 02/27/2021  ? GFRNONAA 57 (L) 03/28/2021  ? GFRAA 67 11/29/2019  ? NA 140 03/28/2021  ? K 3.9 03/28/2021  ? CALCIUM 9.4 05/02/2021  ? CO2 25 03/28/2021  ? GLUCOSE 173 (H) 03/28/2021  ? ? ?Lab Results  ?Component Value Date/Time  ? HGBA1C 6.5 (H) 02/27/2021 10:30 AM  ? HGBA1C 6.3 (H) 12/25/2020 12:26 PM  ? MICROALBUR 30 02/27/2021 10:48 AM  ? MICROALBUR 10 02/09/2020 10:59 AM  ?  ?Last diabetic Eye exam:  ?Lab Results  ?Component Value Date/Time  ? HMDIABEYEEXA No Retinopathy 03/08/2021 12:00 AM  ?  ?Last diabetic Foot exam: No results found for: HMDIABFOOTEX  ? ?Lab Results  ?Component  Value Date  ? CHOL 158 02/27/2021  ? HDL 73 02/27/2021  ? LDLCALC 74 02/27/2021  ? TRIG 50 02/27/2021  ? CHOLHDL 2.2 02/27/2021  ? ? ? ?  Latest Ref Rng & Units 05/02/2021  ?  3:08 PM 03/28/2021  ?  9:57 AM 02/27/2021  ? 10:30 AM  ?Hepatic Function  ?Total Protein 6.0 - 8.5 g/dL 7.1   7.7   7.5    ?Albumin 3.5 - 5.0 g/dL  4.1   4.6    ?AST 15 - 41 U/L  25   27    ?ALT 0 - 44 U/L  19   24    ?Alk Phosphatase 38 - 126 U/L  81   86    ?Total Bilirubin 0.3 - 1.2 mg/dL  0.5   0.4    ? ? ?Lab Results  ?Component Value Date/Time  ? TSH 2.470 05/02/2021 03:08 PM  ? TSH 4.574 (H) 10/12/2020 09:31 PM  ? TSH 2.840 05/31/2020 10:41 AM  ? FREET4 1.36 05/02/2021 03:08 PM  ? FREET4 1.54 07/05/2018 11:17 AM  ? ? ? ?  Latest Ref Rng & Units 03/28/2021  ?  9:57 AM 02/27/2021  ? 10:30 AM 11/06/2020  ?  3:44 AM  ?CBC  ?WBC 4.0 - 10.5 K/uL 8.2   9.7   5.1    ?Hemoglobin 12.0 - 15.0 g/dL 11.7   11.5   7.6    ?Hematocrit 36.0 - 46.0 % 35.0     32.8   23.3    ?Platelets 150 - 400 K/uL 228   208   211    ? ? ?Lab Results  ?Component Value Date/Time  ? VD25OH 81.3 02/09/2020 02:43 PM  ? ? ?Clinical ASCVD: Yes  ?The 10-year ASCVD risk score (Arnett DK, et al., 2019) is: 53.4% ?  Values used to calculate the score: ?    Age: 76 years ?    Sex: Female ?    Is Non-Hispanic African American: Yes ?    Diabetic: Yes ?    Tobacco smoker: Yes ?    Systolic Blood Pressure: 122 mmHg ?    Is BP treated: Yes ?    HDL Cholesterol: 73 mg/dL ?    Total Cholesterol: 158 mg/dL   ? ? ?  09/20/2020  ?  2:30 PM 03/15/2020  ? 12:27 PM 08/18/2019  ? 12:22 PM  ?Depression screen PHQ 2/9  ?Decreased Interest 0 0 0  ?Down, Depressed, Hopeless 0 2 0  ?PHQ - 2 Score 0 2 0  ?Altered sleeping  1 0  ?Tired, decreased energy  2 3  ?Change in appetite  0 0  ?Feeling bad or failure about yourself   2 0  ?Trouble concentrating  0 0  ?Moving slowly or fidgety/restless  0 0  ?Suicidal thoughts  0 0  ?PHQ-9 Score  7 3  ?Difficult doing work/chores   Not difficult at all  ?  ? ?Social History   ? ?Tobacco Use  ?Smoking Status Former  ? Packs/day: 0.25  ? Years: 10.00  ? Pack years: 2.50  ? Types: Cigarettes  ?Smokeless Tobacco Never  ?Tobacco Comments  ? quit 20 years ago  ? ?BP Readings from Last 3 Encounters:  ?06/07/21 122/68  ?05/02/21 120/76  ?03/28/21 (!) 159/68  ? ?Pulse Readings from Last 3 Encounters:  ?06/07/21 (!) 56  ?05/02/21 60  ?03/28/21 78  ? ?Wt Readings from Last 3 Encounters:  ?06/07/21 198 lb 12.8 oz (90.2 kg)  ?05/02/21 200 lb 9.6 oz (91 kg)  ?03/28/21 199 lb 14.4 oz (90.7 kg)  ? ?BMI Readings from Last 3 Encounters:  ?06/07/21 33.08 kg/m?  ?05/02/21 33.38 kg/m?  ?03/28/21 33.27 kg/m?  ? ? ?Assessment/Interventions: Review of patient past medical history, allergies, medications, health status, including review of consultants reports, laboratory and other test data, was performed as part of comprehensive evaluation and provision of chronic care management services.  ? ?SDOH:  (Social Determinants of Health) assessments and interventions performed: No ? ?SDOH Screenings  ? ?Alcohol Screen: Not on file  ?Depression (PHQ2-9): Low Risk   ? PHQ-2 Score: 0  ?Financial Resource Strain: Low Risk   ? Difficulty of Paying Living Expenses: Not hard at all  ?Food Insecurity: No Food Insecurity  ? Worried About Running Out of Food in the Last Year: Never true  ? Ran Out of Food in the Last Year: Never true  ?Housing: Low Risk   ? Last Housing Risk Score: 0  ?Physical Activity: Insufficiently Active  ? Days of Exercise per Week: 3 days  ? Minutes of Exercise per Session: 40 min  ?Social Connections: Not on file  ?Stress: No Stress Concern Present  ? Feeling of Stress : Not at all  ?Tobacco Use: Medium Risk  ? Smoking Tobacco Use: Former  ? Smokeless Tobacco Use: Never  ? Passive Exposure: Not on file  ?Transportation Needs: No Transportation Needs  ? Lack of Transportation (Medical): No  ? Lack of Transportation (  Non-Medical): No  ? ? ?CCM Care Plan ? ?Allergies  ?Allergen Reactions  ? Meloxicam  Other (See Comments)  ?  Fever; muscle aches; "flu-like" symptoms ?"Allergic," per MAR  ? Other Other (See Comments)  ?  "Rose fever and hay fever"  ? ? ?Medications Reviewed Today   ? ? Reviewed by Mayford Knife, RPH (Pharmacist) on 06/25/21 at 1434  Med List Status: <None>  ? ?Medication Order Taking? Sig Documenting Provider Last Dose Status Informant  ?amLODipine (NORVASC) 5 MG tablet [id number]  Take 1 tablet by mouth once daily Glendale Chard, MD  Active   ?B Complex-C (B-COMPLEX WITH VITAMIN C) tablet [id number]  Take 1 tablet by mouth 3 (three) times a week. [provider]  Active Nursing Home Medication Administration Guide (MAG)  ?Biotin 10000 MCG TABS [id number]  Take 10,000 mcg by mouth daily. [provider]  Active Nursing Home Medication Administration Guide (MAG)  ?Cholecalciferol 5000 units TABS [id number]  Take 5,000 Units by mouth daily. [provider]  Active Nursing Home Medication Administration Guide (MAG)  ?         ?Med Note Jimmey Ralph, MUHAMMAD I   Fri Aug 07, 2017  6:51 PM)    ?colchicine 0.6 MG tablet [id number]  Take 1 tablet by mouth for gout attack. Glendale Chard, MD  Active   ?diclofenac Sodium (VOLTAREN) 1 % GEL [id number]  APPLY 2 GRAMS TO AFFECTED AREA(S) 4 TIMES DAILY AS NEEDED  ?Patient taking differently: Apply 2-4 g topically every 6 (six) hours as needed (to affected areas).  ? Glendale Chard, MD  Active   ?gabapentin (NEURONTIN) 300 MG capsule [id number]  Take 600 mg by mouth at bedtime. [provider]  Active Nursing Home Medication Administration Guide (MAG)  ?glucose blood (ONETOUCH VERIO) test strip [id number]  USE TO CHECK BLOOD SUGAR   TWO TIMES A DAY AS         INSTRUCTED Glendale Chard, MD  Active   ?Insulin Degludec (TRESIBA) 100 UNIT/ML SOLN [id number]  Inject 8 Units/oz/day into the skin. Patient is taking at bedtime [provider]  Active   ?Lancets (ONETOUCH DELICA PLUS [id number]) [place] [id number]  USE TO CHECK BLOOD  SUGARS  TWO TIMES A DAY AS         INSTRUCTED Glendale Chard, MD  Active   ?losartan (COZAAR) 100 MG tablet [id number]  TAKE 1 TABLET DAILY Glendale Chard, MD  Active   ?metoprolol succinate (TOPROL-XL) 25 MG 2

## 2021-06-25 NOTE — Progress Notes (Incomplete Revision)
? ?Chronic Care Management ?Pharmacy Note ? ?06/26/2021 ?Name:  Teresa Golden MRN:  [id number] DOB:  01-11-1946 ? ?Summary: ?Patient reports that she is doing well with her current medications  ? ?Recommendations/Changes made from today's visit: ?Recommend patient continue current medication regimen  ? ?Plan: ?Patient to have COVID-19 Booster vaccine added to her chart ? ? ?Subjective: ?Teresa Golden is an 76 y.o. year old female who is a primary patient of Glendale Chard, MD.  The CCM team was consulted for assistance with disease management and care coordination needs.   ? ?Engaged with patient by telephone for follow up visit in response to provider referral for pharmacy case management and/or care coordination services.  ? ?Consent to Services:  ?The patient was given information about Chronic Care Management services, agreed to services, and gave verbal consent prior to initiation of services.  Please see initial visit note for detailed documentation.  ? ?Patient Care Team: ?Glendale Chard, MD as PCP - General (Internal Medicine) ?Elouise Munroe, MD as PCP - Cardiology (Cardiology) ?Little, Claudette Stapler, RN as Case Manager ?Mayford Knife, RPH (Pharmacist) ? ? ? ?Hospital visits: ?None in previous 6 months ? ? ?Objective: ? ?Lab Results  ?Component Value Date  ? CREATININE 1.02 (H) 03/28/2021  ? BUN 15 03/28/2021  ? EGFR 52 (L) 02/27/2021  ? GFRNONAA 57 (L) 03/28/2021  ? GFRAA 67 11/29/2019  ? NA 140 03/28/2021  ? K 3.9 03/28/2021  ? CALCIUM 9.4 05/02/2021  ? CO2 25 03/28/2021  ? GLUCOSE 173 (H) 03/28/2021  ? ? ?Lab Results  ?Component Value Date/Time  ? HGBA1C 6.5 (H) 02/27/2021 10:30 AM  ? HGBA1C 6.3 (H) 12/25/2020 12:26 PM  ? MICROALBUR 30 02/27/2021 10:48 AM  ? MICROALBUR 10 02/09/2020 10:59 AM  ?  ?Last diabetic Eye exam:  ?Lab Results  ?Component Value Date/Time  ? HMDIABEYEEXA No Retinopathy 03/08/2021 12:00 AM  ?  ?Last diabetic Foot exam: No results found for: HMDIABFOOTEX  ? ?Lab Results  ?Component  Value Date  ? CHOL 158 02/27/2021  ? HDL 73 02/27/2021  ? [place] 74 02/27/2021  ? TRIG 50 02/27/2021  ? CHOLHDL 2.2 02/27/2021  ? ? ? ?  Latest Ref Rng & Units 05/02/2021  ?  3:08 PM 03/28/2021  ?  9:57 AM 02/27/2021  ? 10:30 AM  ?Hepatic Function  ?Total Protein 6.0 - 8.5 g/dL 7.1   7.7   7.5    ?Albumin 3.5 - 5.0 g/dL  4.1   4.6    ?AST 15 - 41 U/L  25   27    ?ALT 0 - 44 U/L  19   24    ?Alk Phosphatase 38 - 126 U/L  81   86    ?Total Bilirubin 0.3 - 1.2 mg/dL  0.5   0.4    ? ? ?Lab Results  ?Component Value Date/Time  ? TSH 2.470 05/02/2021 03:08 PM  ? TSH 4.574 (H) 10/12/2020 09:31 PM  ? TSH 2.840 05/31/2020 10:41 AM  ? FREET4 1.36 05/02/2021 03:08 PM  ? FREET4 1.54 07/05/2018 11:17 AM  ? ? ? ?  Latest Ref Rng & Units 03/28/2021  ?  9:57 AM 02/27/2021  ? 10:30 AM 11/06/2020  ?  3:44 AM  ?CBC  ?WBC 4.0 - 10.5 K/uL 8.2   9.7   5.1    ?Hemoglobin 12.0 - 15.0 g/dL 11.7   11.5   7.6    ?Hematocrit 36.0 - 46.0 % 35.0  32.8   23.3    ?Platelets 150 - 400 K/uL 228   208   211    ? ? ?Lab Results  ?Component Value Date/Time  ? VD25OH 81.3 02/09/2020 02:43 PM  ? ? ?Clinical ASCVD: Yes  ?The 10-year ASCVD risk score (Arnett DK, et al., 2019) is: 53.4% ?  Values used to calculate the score: ?    Age: 76 years ?    Sex: Female ?    Is Non-Hispanic African American: Yes ?    Diabetic: Yes ?    Tobacco smoker: Yes ?    Systolic Blood Pressure: 341 mmHg ?    Is BP treated: Yes ?    HDL Cholesterol: 73 mg/dL ?    Total Cholesterol: 158 mg/dL   ? ? ?  09/20/2020  ?  2:30 PM 03/15/2020  ? 12:27 PM 08/18/2019  ? 12:22 PM  ?Depression screen PHQ 2/9  ?Decreased Interest 0 0 0  ?Down, Depressed, Hopeless 0 2 0  ?PHQ - 2 Score 0 2 0  ?Altered sleeping  1 0  ?Tired, decreased energy  2 3  ?Change in appetite  0 0  ?Feeling bad or failure about yourself   2 0  ?Trouble concentrating  0 0  ?Moving slowly or fidgety/restless  0 0  ?Suicidal thoughts  0 0  ?PHQ-9 Score  7 3  ?Difficult doing work/chores   Not difficult at all  ?  ? ?Social History   ? ?Tobacco Use  ?Smoking Status Former  ? Packs/day: 0.25  ? Years: 10.00  ? Pack years: 2.50  ? Types: Cigarettes  ?Smokeless Tobacco Never  ?Tobacco Comments  ? quit 20 years ago  ? ?BP Readings from Last 3 Encounters:  ?06/07/21 122/68  ?05/02/21 120/76  ?03/28/21 (!) 159/68  ? ?Pulse Readings from Last 3 Encounters:  ?06/07/21 (!) 56  ?05/02/21 60  ?03/28/21 78  ? ?Wt Readings from Last 3 Encounters:  ?06/07/21 198 lb 12.8 oz (90.2 kg)  ?05/02/21 200 lb 9.6 oz (91 kg)  ?03/28/21 199 lb 14.4 oz (90.7 kg)  ? ?BMI Readings from Last 3 Encounters:  ?06/07/21 33.08 kg/m?  ?05/02/21 33.38 kg/m?  ?03/28/21 33.27 kg/m?  ? ? ?Assessment/Interventions: Review of patient past medical history, allergies, medications, health status, including review of consultants reports, laboratory and other test data, was performed as part of comprehensive evaluation and provision of chronic care management services.  ? ?SDOH:  (Social Determinants of Health) assessments and interventions performed: No ? ?SDOH Screenings  ? ?Alcohol Screen: Not on file  ?Depression (PHQ2-9): Low Risk   ? PHQ-2 Score: 0  ?Financial Resource Strain: Low Risk   ? Difficulty of Paying Living Expenses: Not hard at all  ?Food Insecurity: No Food Insecurity  ? Worried About Charity fundraiser in the Last Year: Never true  ? Ran Out of Food in the Last Year: Never true  ?Housing: Low Risk   ? Last Housing Risk Score: 0  ?Physical Activity: Insufficiently Active  ? Days of Exercise per Week: 3 days  ? Minutes of Exercise per Session: 40 min  ?Social Connections: Not on file  ?Stress: No Stress Concern Present  ? Feeling of Stress : Not at all  ?Tobacco Use: Medium Risk  ? Smoking Tobacco Use: Former  ? Smokeless Tobacco Use: Never  ? Passive Exposure: Not on file  ?Transportation Needs: No Transportation Needs  ? Lack of Transportation (Medical): No  ? Lack of Transportation (  Non-Medical): No  ? ? ?CCM Care Plan ? ?Allergies  ?Allergen Reactions  ? Meloxicam  Other (See Comments)  ?  Fever; muscle aches; "flu-like" symptoms ?"Allergic," per MAR  ? Other Other (See Comments)  ?  "Rose fever and hay fever"  ? ? ?Medications Reviewed Today   ? ? Reviewed by Mayford Knife, RPH (Pharmacist) on 06/25/21 at 1434  Med List Status: <None>  ? ?Medication Order Taking? Sig Documenting Provider Last Dose Status Informant  ?amLODipine (NORVASC) 5 MG tablet [id number]  Take 1 tablet by mouth once daily Glendale Chard, MD  Active   ?B Complex-C (B-COMPLEX WITH VITAMIN C) tablet [id number]  Take 1 tablet by mouth 3 (three) times a week. [provider]  Active Nursing Home Medication Administration Guide (MAG)  ?Biotin 10000 MCG TABS [id number]  Take 10,000 mcg by mouth daily. [provider]  Active Nursing Home Medication Administration Guide (MAG)  ?Cholecalciferol 5000 units TABS [id number]  Take 5,000 Units by mouth daily. [provider]  Active Nursing Home Medication Administration Guide (MAG)  ?         ?Med Note Jimmey Ralph, MUHAMMAD I   Fri Aug 07, 2017  6:51 PM)    ?colchicine 0.6 MG tablet [id number]  Take 1 tablet by mouth for gout attack. Glendale Chard, MD  Active   ?diclofenac Sodium (VOLTAREN) 1 % GEL [id number]  APPLY 2 GRAMS TO AFFECTED AREA(S) 4 TIMES DAILY AS NEEDED  ?Patient taking differently: Apply 2-4 g topically every 6 (six) hours as needed (to affected areas).  ? Glendale Chard, MD  Active   ?gabapentin (NEURONTIN) 300 MG capsule [id number]  Take 600 mg by mouth at bedtime. [provider]  Active Nursing Home Medication Administration Guide (MAG)  ?glucose blood (ONETOUCH VERIO) test strip [id number]  USE TO CHECK BLOOD SUGAR   TWO TIMES A DAY AS         INSTRUCTED Glendale Chard, MD  Active   ?Insulin Degludec (TRESIBA) 100 UNIT/ML SOLN [id number]  Inject 8 Units/oz/day into the skin. Patient is taking at bedtime [provider]  Active   ?Lancets (ONETOUCH DELICA PLUS [id number]) [place] [id number]  USE TO CHECK BLOOD  SUGARS  TWO TIMES A DAY AS         INSTRUCTED Glendale Chard, MD  Active   ?losartan (COZAAR) 100 MG tablet [id number]  TAKE 1 TABLET DAILY Glendale Chard, MD  Active   ?metoprolol succinate (TOPROL-XL) 25 MG 2

## 2021-06-26 NOTE — Patient Instructions (Signed)
Visit Information ?It was great speaking with you today!  Please let me know if you have any questions about our visit. ? ? Goals Addressed   ? ?  ?  ?  ?  ? This Visit's Progress  ?  Manage My Medicine     ?  Timeframe:  Long-Range Goal ?Priority:  High ?Start Date:    04/11/2020                         ?Expected End Date:                      ? ?Follow Up Date 12/10/2021 ?  ?- call for medicine refill 2 or 3 days before it runs out ?- call if I am sick and can't take my medicine ?- keep a list of all the medicines I take; vitamins and herbals too ?- use an alarm clock or phone to remind me to take my medicine  ?  ?Why is this important?   ?These steps will help you keep on track with your medicines. ?  ? ?  ? ?  ? ?Patient Care Plan: [place]  ?  ? ?Problem Identified: HTN, HLD   ?Priority: High  ?  ? ?Long-Range Goal: Disease Management   ?Start Date: 04/11/2020  ?Recent Progress: On track  ?Priority: High  ?Note:   ? ? ?Current Barriers:  ?Unable to independently monitor therapeutic efficacy ? ?Pharmacist Clinical Goal(s):  ?Patient will achieve adherence to monitoring guidelines and medication adherence to achieve therapeutic efficacy through collaboration with PharmD and provider.  ? ?Interventions: ?1:1 collaboration with Glendale Chard, MD regarding development and update of comprehensive plan of care as evidenced by provider attestation and co-signature ?Inter-disciplinary care team collaboration (see longitudinal plan of care) ?Comprehensive medication review performed; medication list updated in electronic medical record ? ?Hypertension (BP goal <130/80) ?-Controlled ?-Current treatment: ?Losartan 100 mg tablet daily Appropriate, Effective, Safe, Accessible ?Amlodipine 5 mg tablet daily Appropriate, Effective, Safe, Accessible ?-Medications previously tried: none noted at this time   ?-Current home readings: 122-123/ under 80  ?-Current dietary habits: she is eating a lot of fresh fish and  also eating iceberg salad with red cabbage and tomatoes.  ?-Current exercise habits: she is currently exercising once per week for 45 minutes in the water, and then she goes in the [place] for 20 minutes  ?-Denies hypotensive/hypertensive symptoms ?-Counseled to monitor BP at home once per day, document, and provide log at future appointments ?-Recommended to continue current medication ? ? ?Diabetes (A1c goal <7%) ?-Controlled ?-Current medications: ?Tresiba 100 unit/ml - Inject 8 units Appropriate, Effective, Safe, Accessible ?Ozempic 1 mg once per week Appropriate, Effective, Safe, Accessible ?-Current home glucose readings ?fasting glucose: 113, 107 ?-Denies hypoglycemic/hyperglycemic symptoms ?-Current meal patterns: she is doing well with her eating habits, she reports that she is used to eating a lot of green vegetables but due to the coumadin she is not able to eat as many green vegetables that she prefers.  ?drinks: Gatorade zero, water- she is trying to drink at least 4 to 5 glasses per day.  ?-Current exercise: [facility] at [facility] 15 minutes, cool down, jog another 10 minutes, and then the [place], in addition to her regular routine.  ?-Educated on Complications of diabetes including kidney damage, retinal damage, and cardiovascular disease; ?-Counseled to check feet daily and get yearly eye  exams ?-Recommended to continue current medication ? ?Patient Goals/Self-Care Activities ?Patient will:  ?- take medications as prescribed as evidenced by patient report and record review ? ?Follow Up Plan: The patient has been provided with contact information for the care management team and has been advised to call with any health related questions or concerns.  ? ?  ? ? ?Patient agreed to services and verbal consent obtained.  ? ?The patient verbalized understanding of instructions, educational materials, and care plan provided today and agreed to receive a mailed copy of patient  instructions, educational materials, and care plan.  ? ?[name], PharmD ?Clinical Pharmacist ?[facility] ?[phone] ?  ?

## 2021-06-27 ENCOUNTER — Ambulatory Visit (INDEPENDENT_AMBULATORY_CARE_PROVIDER_SITE_OTHER): Payer: Medicare HMO | Admitting: Addiction (Substance Use Disorder)

## 2021-06-27 DIAGNOSIS — F4323 Adjustment disorder with mixed anxiety and depressed mood: Secondary | ICD-10-CM

## 2021-06-27 NOTE — Progress Notes (Signed)
?      [facility] Counselor/Therapist Progress Note ? ?[name], MRN: [id number],   ? ?Date: 06/27/2021 ?  ?Time Spent:  50mns ? ?Treatment Type: Individual Therapy ? ?Reported Symptoms: assertive, motivated.  ? ?Mental Status Exam: ? ?Appearance:   Casual and Well Groomed     ?Behavior:  Appropriate and Sharing  ?Motor:  Normal  ?Speech/Language:   Clear and Coherent and Normal Rate  ?Affect:  Appropriate  ?Mood:  normal  ?Thought process:  normal  ?Thought content:    Rumination  ?Sensory/Perceptual disturbances:    WNL  ?Orientation:  x4  ?Attention:  Good  ?Concentration:  Good  ?Memory:  WNL  ?Fund of knowledge:   Good  ?Insight:    Good  ?Judgment:   Good  ?Impulse Control:  Good  ? ?Risk Assessment: ?Danger to Self:  No ?Self-injurious Behavior: No ?Danger to Others: No ?Duty to Warn:no ?Physical Aggression / Violence:No  ?Access to Firearms a concern: No  ?Gang Involvement:No  ? ?Subjective: Client reported that  "today I feel im good enough" for men or anyone in her life. Client feeling that her 'bf' really cares and is going to pick her one day. Client reported feeling judged by [place] women for "low morals" by sharing her bf with someone else. Therapist encouraged client to do what makes her feel safe and loved. Client justified her behavior and then stated she didn't know if what she was doing was wise. Client processed feeling like good relationships take time and therapist used MI to affirm client's worth and encourage client to continue to honor herself and treat herself with self-respect. Client also terminated therapy with therapist & therapist provided additional referrals if client decides to continue therapy. Therapist assessed for stability and client denied SI/HI/AVH.  ? ?Interventions: Cognitive Behavioral Therapy, Motivational Interviewing, and RPT   ? ?Diagnosis: ?  ICD-10-CM   ?1. Adjustment disorder with mixed anxiety and depressed mood  F43.23   ?  ? ? ?Plan of Care:   ?Client is to return to therapy with therapist every 1-2 weeks as needed to process traumas/ frustrations in a safe space, to be re-evaluated in 3 months.  ?Client is to practice mindfulness AEB daily meditation and body scans or as needed when flooded by emotion/pain.  ?Client is to learn/practice DBT wise mind & radical acceptance. ?Client is to practice self-compassion AEB being gentle with themselves, utilizing self-care techniques daily or as needed when grieving something in the moment.  ?Client is to process grief/pain of their loss in a somatic body-felt sense way: ie using mindfulness, brainspotting, or trauma release as a method for releasing body pain/tension caused by grief. ? ?KBarnie Del LCSW, LCAS, CCTP, CCS-I, BSP ? ? ? ? ? ? ? ? ? ?

## 2021-07-08 ENCOUNTER — Telehealth: Payer: Self-pay

## 2021-07-08 ENCOUNTER — Other Ambulatory Visit: Payer: Self-pay | Admitting: Internal Medicine

## 2021-07-08 DIAGNOSIS — Z1231 Encounter for screening mammogram for malignant neoplasm of breast: Secondary | ICD-10-CM

## 2021-07-08 NOTE — Progress Notes (Signed)
    Chronic Care Management Pharmacy Assistant   Name: Teresa Golden  MRN: [id number] DOB: 1945-10-26  Reason for Encounter: Ozempic follow up   07-08-2021: Patient called following up with Ozempic. Contacted [facility] and was told shipment was returned on 05-17-2021 due to office being closed and never reprocessed. Order was reprocessed and voucher issued. Provided [facility] with voucher info. BIN [id number] PCN [id number] GP [id number] ID [id number]   Medications: Outpatient Encounter Medications as of 07/08/2021  Medication Sig   amLODipine (NORVASC) 5 MG tablet Take 1 tablet by mouth once daily   B Complex-C (B-COMPLEX WITH VITAMIN C) tablet Take 1 tablet by mouth 3 (three) times a week.   Biotin 10000 MCG TABS Take 10,000 mcg by mouth daily.   Cholecalciferol 5000 units TABS Take 5,000 Units by mouth daily.   colchicine 0.6 MG tablet Take 1 tablet by mouth for gout attack.   diclofenac Sodium (VOLTAREN) 1 % GEL APPLY 2 GRAMS TO AFFECTED AREA(S) 4 TIMES DAILY AS NEEDED (Patient taking differently: Apply 2-4 g topically every 6 (six) hours as needed (to affected areas).)   gabapentin (NEURONTIN) 300 MG capsule Take 600 mg by mouth at bedtime.   glucose blood (ONETOUCH VERIO) test strip USE TO CHECK BLOOD SUGAR   TWO TIMES A DAY AS         INSTRUCTED   Insulin Degludec (TRESIBA) 100 UNIT/ML SOLN Inject 8 Units/oz/day into the skin. Patient is taking at bedtime   Lancets (ONETOUCH DELICA PLUS OEHOZY24M) MISC USE TO CHECK BLOOD SUGARS  TWO TIMES A DAY AS         INSTRUCTED   levothyroxine (SYNTHROID) 25 MCG tablet Take 25 mcg by mouth daily before breakfast. Taking 1 tablet every other day, and a 1/2 tablet on other days.   losartan (COZAAR) 100 MG tablet TAKE 1 TABLET DAILY   metoprolol succinate (TOPROL-XL) 25 MG 24 hr tablet TAKE 1 TABLET AT BEDTIME   (DISCONTINUE COREG)   Multiple Vitamins-Minerals (MULTIVITAMIN WITH MINERALS) tablet Take 1 tablet by mouth daily. Women's 50+   Semaglutide, 1  MG/DOSE, (OZEMPIC, 1 MG/DOSE,) 4 MG/3ML SOPN Inject 1 mg into the skin once a week.   Semaglutide, 1 MG/DOSE, (OZEMPIC, 1 MG/DOSE,) 4 MG/3ML SOPN Inject 1 mg into the skin once a week.   simvastatin (ZOCOR) 10 MG tablet Take 1 tablet (10 mg total) by mouth daily.   traMADol (ULTRAM) 50 MG tablet Take 1 tablet (50 mg total) by mouth every 6 (six) hours as needed.   warfarin (COUMADIN) 5 MG tablet Take 1 tablet (5 mg total) by mouth daily.   [DISCONTINUED] SEMGLEE, YFGN, 100 UNIT/ML Pen Inject 9 Units into the skin at bedtime.   No facility-administered encounter medications on file as of 07/08/2021.    [place] Pharmacist Assistant [phone]

## 2021-07-09 ENCOUNTER — Telehealth: Payer: Self-pay

## 2021-07-09 ENCOUNTER — Ambulatory Visit: Payer: Medicare HMO

## 2021-07-09 ENCOUNTER — Ambulatory Visit: Payer: Medicare HMO | Admitting: Addiction (Substance Use Disorder)

## 2021-07-09 DIAGNOSIS — N1831 Chronic kidney disease, stage 3a: Secondary | ICD-10-CM

## 2021-07-09 DIAGNOSIS — E1122 Type 2 diabetes mellitus with diabetic chronic kidney disease: Secondary | ICD-10-CM

## 2021-07-09 DIAGNOSIS — I129 Hypertensive chronic kidney disease with stage 1 through stage 4 chronic kidney disease, or unspecified chronic kidney disease: Secondary | ICD-10-CM

## 2021-07-09 NOTE — Patient Instructions (Signed)
Social Worker Visit Information ? ?Goals we discussed today:  ?Patient Goals/Self-Care Activities ?patient will:  ? - work with primary care provider to obtain a referral for counseling ? ?Materials Provided: Verbal education about referral process provided by phone ? ?Patient verbalizes understanding of instructions and care plan provided today and agrees to view in [place]. Active MyChart status confirmed with patient.   ? ?Follow Up Plan: SW will follow up with patient by phone over the next 21 days ? ? ?Daneen Schick, BSW, CDP ?Social Worker, Certified Dementia Practitioner ?[facility] ?[phone] ? ?   ?  ?

## 2021-07-09 NOTE — Chronic Care Management (AMB) (Signed)
?Chronic Care Management  ? ? Social Work Note ? ?07/09/2021 ?Name: Teresa Golden MRN: [id number] DOB: 1945/09/17 ? ?Teresa Golden is a 76 y.o. year old female who is a primary care patient of Glendale Chard, MD. The CCM team was consulted to assist the patient with chronic disease management and/or care coordination needs related to:  DM II, CKD III, HTN .  ? ?Engaged with patient by telephone for follow up visit in response to provider referral for social work chronic care management and care coordination services.  ? ?Consent to Services:  ?The patient was given information about Chronic Care Management services, agreed to services, and gave verbal consent prior to initiation of services.  Please see initial visit note for detailed documentation.  ? ?Patient agreed to services and consent obtained.  ? ?Assessment: Review of patient past medical history, allergies, medications, and health status, including review of relevant consultants reports was performed today as part of a comprehensive evaluation and provision of chronic care management and care coordination services.    ? ?SDOH (Social Determinants of Health) assessments and interventions performed:   ? ?Advanced Directives Status: Not addressed in this encounter. ? ?CCM Care Plan ? ?Allergies  ?Allergen Reactions  ? Meloxicam Other (See Comments)  ?  Fever; muscle aches; "flu-like" symptoms ?"Allergic," per MAR  ? Other Other (See Comments)  ?  "Rose fever and hay fever"  ? ? ?Outpatient Encounter Medications as of 07/09/2021  ?Medication Sig  ? amLODipine (NORVASC) 5 MG tablet Take 1 tablet by mouth once daily  ? B Complex-C (B-COMPLEX WITH VITAMIN C) tablet Take 1 tablet by mouth 3 (three) times a week.  ? Biotin 10000 MCG TABS Take 10,000 mcg by mouth daily.  ? Cholecalciferol 5000 units TABS Take 5,000 Units by mouth daily.  ? colchicine 0.6 MG tablet Take 1 tablet by mouth for gout attack.  ? diclofenac Sodium (VOLTAREN) 1 % GEL APPLY 2 GRAMS TO AFFECTED  AREA(S) 4 TIMES DAILY AS NEEDED (Patient taking differently: Apply 2-4 g topically every 6 (six) hours as needed (to affected areas).)  ? gabapentin (NEURONTIN) 300 MG capsule Take 600 mg by mouth at bedtime.  ? glucose blood (ONETOUCH VERIO) test strip USE TO CHECK BLOOD SUGAR   TWO TIMES A DAY AS         INSTRUCTED  ? Insulin Degludec (TRESIBA) 100 UNIT/ML SOLN Inject 8 Units/oz/day into the skin. Patient is taking at bedtime  ? Lancets (ONETOUCH DELICA PLUS [id number]) MISC USE TO CHECK BLOOD SUGARS  TWO TIMES A DAY AS         INSTRUCTED  ? levothyroxine (SYNTHROID) 25 MCG tablet Take 25 mcg by mouth daily before breakfast. Taking 1 tablet every other day, and a 1/2 tablet on other days.  ? losartan (COZAAR) 100 MG tablet TAKE 1 TABLET DAILY  ? metoprolol succinate (TOPROL-XL) 25 MG 24 hr tablet TAKE 1 TABLET AT BEDTIME   (DISCONTINUE COREG)  ? Multiple Vitamins-Minerals (MULTIVITAMIN WITH MINERALS) tablet Take 1 tablet by mouth daily. Women's 50+  ? Semaglutide, 1 MG/DOSE, (OZEMPIC, 1 MG/DOSE,) 4 MG/3ML SOPN Inject 1 mg into the skin once a week.  ? Semaglutide, 1 MG/DOSE, (OZEMPIC, 1 MG/DOSE,) 4 MG/3ML SOPN Inject 1 mg into the skin once a week.  ? simvastatin (ZOCOR) 10 MG tablet Take 1 tablet (10 mg total) by mouth daily.  ? traMADol (ULTRAM) 50 MG tablet Take 1 tablet (50 mg total) by mouth every 6 (six) hours as  needed.  ? warfarin (COUMADIN) 5 MG tablet Take 1 tablet (5 mg total) by mouth daily.  ? [DISCONTINUED] SEMGLEE, YFGN, 100 UNIT/ML Pen Inject 9 Units into the skin at bedtime.  ? ?No facility-administered encounter medications on file as of 07/09/2021.  ? ? ?Patient Active Problem List  ? Diagnosis Date Noted  ? Long term (current) use of anticoagulants 04/05/2021  ? Acute venous embolism and thrombosis of deep vessels of proximal lower extremity ([place]) 04/05/2021  ? Infected wound 11/02/2020  ? Pressure injury of skin 10/22/2020  ? Wound infection after surgery 10/12/2020  ? Normocytic anemia  10/12/2020  ? AKI (acute kidney injury) ([place]) 10/12/2020  ? Obesity (BMI 30-39.9) 10/12/2020  ? Sepsis ([place]) 10/12/2020  ? Type 2 diabetes mellitus with stage 3 chronic kidney disease, with long-term current use of insulin ([place]) 11/29/2019  ? Panniculitis 09/30/2019  ? Back pain 09/30/2019  ? Hepatoma ([place]) 01/21/2019  ? Other cirrhosis of liver ([place]) 07/05/2018  ? Hypertensive nephropathy 07/05/2018  ? Chronic renal disease, stage III ([place]) 07/05/2018  ? Chronic left shoulder pain 07/05/2018  ? Hypothyroidism 02/06/2018  ? Hepatitis C virus infection cured after antiviral drug therapy 02/04/2018  ? Osteoarthritis of right knee 07/30/2017  ? S/P laparoscopic sleeve gastrectomy July 2018 08/26/2016  ? Preop cardiovascular exam 07/30/2016  ? Dyspnea on exertion 07/30/2016  ? Bilateral lower extremity edema 07/30/2016  ? ? ?Conditions to be addressed/monitored: HTN, DMII, and CKD Stage III ; Referral to therapy ? ?Care Plan : Social Work Plan of Care  ?Updates made by Daneen Schick since 07/09/2021 12:00 AM  ?  ? ?Problem: Barriers to Treatment   ?  ? ?Goal: Barriers to Treatment Identified and Managed   ?Start Date: 07/09/2021  ?Priority: High  ?Note:   ?Current Barriers:  ?Chronic disease management support and education needs related to HTN, DM, and CKD Stage III   ?Mental Health Concerns  ? ?Social Worker Clinical Goal(s):  ?patient will work with SW to identify and address any acute and/or chronic care coordination needs related to the self health management of HTN, DM, and CKD Stage III   ?Patient will work with primary care team to address concerns related to becoming established with a new therapist ? ?SW Interventions:  ?Inter-disciplinary care team collaboration (see longitudinal plan of care) ?Collaboration with Glendale Chard, MD regarding development and update of comprehensive plan of care as evidenced by provider attestation and co-signature ?Telephonic visit completed with the patient who indicates she  has chosen to end her therapeutic relationship with her current therapist at [facility] due to the concern her therapist ate breakfast in front of her and is now having a dog attend sessions ?Discussed the office manager indicated to the patient there were no other therapists available who bill Medicare within the clinic ?Advised the patient SW previously discussed this with Dr. Baird Cancer due to the patient thinking she may want a new therapist and SW was advised the patient would need to be seen in clinic to obtain a new referral ?Performed chart review to note patients next appointment is not scheduled until August. Patient would like to be seen sooner in order to obtain a therapy referral ?Advised the patient SW would collaborate with Dr. Baird Cancer to confirm an OV is needed and request a member of the team contact the patient to schedule an appointment if necessary ?Collaboration with Dr. Baird Cancer to discuss above patient needs ?Scheduled follow up call over the next two weeks to confirm  patient has been contacted ? ?Patient Goals/Self-Care Activities ?patient will:  ? -  work with primary care provider to obtain a referral for counseling ? ?Follow Up Plan: The care management team will reach out to the patient again over the next 21 days.  ? ?  ?  ? ?Follow Up Plan: SW will follow up with patient by phone over the next two weeks. ?     ?Daneen Schick, BSW, CDP ?Social Worker, Certified Dementia Practitioner ?[facility] [place] [facility] ?[phone] ? ?   ? ? ? ? ?

## 2021-07-09 NOTE — Telephone Encounter (Signed)
?  Care Management  ? ?Follow Up Note ? ? ?07/09/2021 ?Name: Teresa Golden MRN: [id number] DOB: 1945-03-26 ? ? ?Referred by: Glendale Chard, MD ?Reason for referral : Chronic Care Management (Unsuccessful Call) ? ? ?An unsuccessful telephone outreach was attempted today. The patient was referred to the case management team for assistance with care management and care coordination.  ? ?Follow Up Plan: The care management team will reach out to the patient again over the next 21 days.  ? ?Daneen Schick, BSW, CDP ?Social Worker, Certified Dementia Practitioner ?[facility] [place] [facility] ?[phone] ? ?   ? ? ?

## 2021-07-09 NOTE — Telephone Encounter (Signed)
Spoke with pt regarding referral to new therapist. Pt is not satisfied with therapist since she has a dog. She also is upset that the therapist eats breakfast in front of her. She is going to call her insurance and see what therapist is covered that she is willing to go to. She is going to give [place] a call back with this information so that we can help her with the referral.  ?

## 2021-07-12 ENCOUNTER — Other Ambulatory Visit: Payer: Self-pay | Admitting: Surgery

## 2021-07-12 DIAGNOSIS — K219 Gastro-esophageal reflux disease without esophagitis: Secondary | ICD-10-CM

## 2021-07-15 ENCOUNTER — Ambulatory Visit (INDEPENDENT_AMBULATORY_CARE_PROVIDER_SITE_OTHER): Payer: Medicare HMO

## 2021-07-15 DIAGNOSIS — M25472 Effusion, left ankle: Secondary | ICD-10-CM

## 2021-07-15 DIAGNOSIS — Z7901 Long term (current) use of anticoagulants: Secondary | ICD-10-CM

## 2021-07-15 DIAGNOSIS — M25471 Effusion, right ankle: Secondary | ICD-10-CM | POA: Diagnosis not present

## 2021-07-15 DIAGNOSIS — I82452 Acute embolism and thrombosis of left peroneal vein: Secondary | ICD-10-CM

## 2021-07-15 LAB — POCT INR: INR: 2 (ref 2.0–3.0)

## 2021-07-15 NOTE — Patient Instructions (Signed)
Continue 1 tablet Daily, except 1.5 tablets on Monday, Wednesday, and Friday  INR 6 weeks.

## 2021-07-16 ENCOUNTER — Telehealth: Payer: Self-pay

## 2021-07-16 NOTE — Telephone Encounter (Signed)
Patient left voicemail inquiring about Ozempic status. Per patient medication delivered on Friday was undeliverable. They are going to send medication for Monday 5/22 at 10 AM. Two voicemails left with practice will call patient back.   [name], CPP, PharmD Clinical Pharmacist Practitioner [facility] [phone]

## 2021-07-18 ENCOUNTER — Ambulatory Visit
Admission: RE | Admit: 2021-07-18 | Discharge: 2021-07-18 | Disposition: A | Discharge status: Home / Self Care | Payer: Medicare HMO | Source: Ambulatory Visit | Attending: Surgery | Admitting: Surgery

## 2021-07-18 ENCOUNTER — Telehealth: Payer: Medicare HMO

## 2021-07-18 ENCOUNTER — Telehealth: Payer: Self-pay

## 2021-07-18 DIAGNOSIS — K219 Gastro-esophageal reflux disease without esophagitis: Secondary | ICD-10-CM

## 2021-07-18 NOTE — Telephone Encounter (Signed)
  Care Management   Follow Up Note   07/18/2021 Name: Teresa Golden MRN: [id number] DOB: [birth year]   Referred by: Glendale Chard, MD Reason for referral : Chronic Care Management (Unsuccessful call)   An unsuccessful telephone outreach was attempted today. The patient was referred to the case management team for assistance with care management and care coordination.   Follow Up Plan: The care management team will reach out to the patient again over the next 21 days.   Daneen Schick, BSW, CDP Social Worker, Certified Dementia Practitioner [place] [facility] [phone]

## 2021-07-23 ENCOUNTER — Ambulatory Visit: Payer: Medicare HMO | Admitting: Addiction (Substance Use Disorder)

## 2021-07-24 DIAGNOSIS — Z794 Long term (current) use of insulin: Secondary | ICD-10-CM | POA: Diagnosis not present

## 2021-07-24 DIAGNOSIS — I10 Essential (primary) hypertension: Secondary | ICD-10-CM | POA: Diagnosis not present

## 2021-07-24 DIAGNOSIS — E1159 Type 2 diabetes mellitus with other circulatory complications: Secondary | ICD-10-CM

## 2021-07-24 DIAGNOSIS — Z87891 Personal history of nicotine dependence: Secondary | ICD-10-CM

## 2021-07-25 ENCOUNTER — Ambulatory Visit (INDEPENDENT_AMBULATORY_CARE_PROVIDER_SITE_OTHER): Payer: Medicare HMO

## 2021-07-25 DIAGNOSIS — I129 Hypertensive chronic kidney disease with stage 1 through stage 4 chronic kidney disease, or unspecified chronic kidney disease: Secondary | ICD-10-CM

## 2021-07-25 DIAGNOSIS — E1122 Type 2 diabetes mellitus with diabetic chronic kidney disease: Secondary | ICD-10-CM

## 2021-07-25 DIAGNOSIS — N1831 Chronic kidney disease, stage 3a: Secondary | ICD-10-CM

## 2021-07-25 NOTE — Chronic Care Management (AMB) (Signed)
Chronic Care Management    Social Work Note  07/25/2021 Name: Teresa Golden MRN: [id number] DOB: 07/31/1945  Teresa Golden is a 76 y.o. year old female who is a primary care patient of Glendale Chard, MD. The CCM team was consulted to assist the patient with chronic disease management and/or care coordination needs related to:  DM II, CKD III, HTN .   Engaged with patient by telephone for follow up visit in response to provider referral for social work chronic care management and care coordination services.   Consent to Services:  The patient was given information about Chronic Care Management services, agreed to services, and gave verbal consent prior to initiation of services.  Please see initial visit note for detailed documentation.   Patient agreed to services and consent obtained.   Assessment: Review of patient past medical history, allergies, medications, and health status, including review of relevant consultants reports was performed today as part of a comprehensive evaluation and provision of chronic care management and care coordination services.     SDOH (Social Determinants of Health) assessments and interventions performed:    Advanced Directives Status: Not addressed in this encounter.  CCM Care Plan  Allergies  Allergen Reactions   Meloxicam Other (See Comments)    Fever; muscle aches; "flu-like" symptoms "Allergic," per [facility]   Other Other (See Comments)    "Rose fever and hay fever"    Outpatient Encounter Medications as of 07/25/2021  Medication Sig   amLODipine (NORVASC) 5 MG tablet Take 1 tablet by mouth once daily   B Complex-C (B-COMPLEX WITH VITAMIN C) tablet Take 1 tablet by mouth 3 (three) times a week.   Biotin 10000 MCG TABS Take 10,000 mcg by mouth daily.   Cholecalciferol 5000 units TABS Take 5,000 Units by mouth daily.   colchicine 0.6 MG tablet Take 1 tablet by mouth for gout attack.   diclofenac Sodium (VOLTAREN) 1 % GEL APPLY 2 GRAMS TO AFFECTED  AREA(S) 4 TIMES DAILY AS NEEDED (Patient taking differently: Apply 2-4 g topically every 6 (six) hours as needed (to affected areas).)   gabapentin (NEURONTIN) 300 MG capsule Take 600 mg by mouth at bedtime.   glucose blood (ONETOUCH VERIO) test strip USE TO CHECK BLOOD SUGAR   TWO TIMES A DAY AS         INSTRUCTED   Insulin Degludec (TRESIBA) 100 UNIT/ML SOLN Inject 8 Units/oz/day into the skin. Patient is taking at bedtime   Lancets (ONETOUCH DELICA PLUS [id number]) MISC USE TO CHECK BLOOD SUGARS  TWO TIMES A DAY AS         INSTRUCTED   levothyroxine (SYNTHROID) 25 MCG tablet Take 25 mcg by mouth daily before breakfast. Taking 1 tablet every other day, and a 1/2 tablet on other days.   losartan (COZAAR) 100 MG tablet TAKE 1 TABLET DAILY   metoprolol succinate (TOPROL-XL) 25 MG 24 hr tablet TAKE 1 TABLET AT BEDTIME   (DISCONTINUE COREG)   Multiple Vitamins-Minerals (MULTIVITAMIN WITH MINERALS) tablet Take 1 tablet by mouth daily. Women's 50+   Semaglutide, 1 MG/DOSE, (OZEMPIC, 1 MG/DOSE,) 4 MG/3ML SOPN Inject 1 mg into the skin once a week.   Semaglutide, 1 MG/DOSE, (OZEMPIC, 1 MG/DOSE,) 4 MG/3ML SOPN Inject 1 mg into the skin once a week.   simvastatin (ZOCOR) 10 MG tablet Take 1 tablet (10 mg total) by mouth daily.   traMADol (ULTRAM) 50 MG tablet Take 1 tablet (50 mg total) by mouth every 6 (six) hours as  needed.   warfarin (COUMADIN) 5 MG tablet Take 1 tablet (5 mg total) by mouth daily.   [DISCONTINUED] SEMGLEE, YFGN, 100 UNIT/ML Pen Inject 9 Units into the skin at bedtime.   No facility-administered encounter medications on file as of 07/25/2021.    Patient Active Problem List   Diagnosis Date Noted   Long term (current) use of anticoagulants 04/05/2021   Acute venous embolism and thrombosis of deep vessels of proximal lower extremity ([place]) 04/05/2021   Infected wound 11/02/2020   Pressure injury of skin 10/22/2020   Wound infection after surgery 10/12/2020   Normocytic anemia  10/12/2020   AKI (acute kidney injury) ([place]) 10/12/2020   Obesity (BMI 30-39.9) 10/12/2020   Sepsis ([place]) 10/12/2020   Type 2 diabetes mellitus with stage 3 chronic kidney disease, with long-term current use of insulin ([place]) 11/29/2019   Panniculitis 09/30/2019   Back pain 09/30/2019   Hepatoma ([place]) 01/21/2019   Other cirrhosis of liver ([place]) 07/05/2018   Hypertensive nephropathy 07/05/2018   Chronic renal disease, stage III ([place]) 07/05/2018   Chronic left shoulder pain 07/05/2018   Hypothyroidism 02/06/2018   Hepatitis C virus infection cured after antiviral drug therapy 02/04/2018   Osteoarthritis of right knee 07/30/2017   S/P laparoscopic sleeve gastrectomy July 2018 08/26/2016   Preop cardiovascular exam 07/30/2016   Dyspnea on exertion 07/30/2016   Bilateral lower extremity edema 07/30/2016    Conditions to be addressed/monitored: HTN, DMII, and CKD Stage III  Care Plan : Social Work Plan of Care  Updates made by Daneen Schick since 07/25/2021 12:00 AM  Completed 07/25/2021   Problem: Barriers to Treatment Resolved 07/25/2021     Goal: Barriers to Treatment Identified and Managed Completed 07/25/2021  Start Date: 07/09/2021  Priority: High  Note:   Current Barriers:  Chronic disease management support and education needs related to HTN, DM, and CKD Stage III   Mental Health Concerns   Social Worker Clinical Goal(s):  patient will work with SW to identify and address any acute and/or chronic care coordination needs related to the self health management of HTN, DM, and CKD Stage III   Patient will work with primary care team to address concerns related to becoming established with a new therapist  SW Interventions:  Inter-disciplinary care team collaboration (see longitudinal plan of care) Collaboration with Glendale Chard, MD regarding development and update of comprehensive plan of care as evidenced by provider attestation and co-signature Telephonic visit completed with  the patient to assess goal progression Determined the patient has located an office for counseling that does accept her insurance  Discussed the patient will have her first session with new counselor at "[facility]" on June 6th Assessed for SW needs - no acute needs at this time Encouraged the patient to contact SW as needed   Patient Goals/Self-Care Activities patient will:   - Attend therapy appointment on June 6th        Follow Up Plan:  No SW follow up planned at this time. The patient is encouraged to contact SW as needed. The patient will remain engaged with RN Care Manager to address care management needs.      Daneen Schick, BSW, CDP Social Worker, Certified Dementia Practitioner [place] [facility] [phone]

## 2021-07-25 NOTE — Patient Instructions (Signed)
Social Worker Visit Information  Goals we discussed today:  Patient Goals/Self-Care Activities patient will:   - Attend therapy appointment on June 6th   Patient verbalizes understanding of instructions and care plan provided today and agrees to view in [place]. Active MyChart status and patient understanding of how to access instructions and care plan via MyChart confirmed with patient.     Follow Up Plan:  No follow up planned at this time. Please contact me as needed.   Daneen Schick, BSW, CDP Social Worker, Certified Dementia Practitioner [place] [facility] [phone]

## 2021-07-26 ENCOUNTER — Telehealth: Payer: Self-pay

## 2021-07-26 NOTE — Chronic Care Management (AMB) (Signed)
Chronic Care Management Pharmacy Assistant   Name: Teresa Golden  MRN: [id number] DOB: [birth year]   Reason for Encounter: Disease State/ Diabetes  Recent office visits:  07-25-2021 Daneen Schick (CCM)  07-09-2021 Daneen Schick (CCM)  Recent consult visits:  07-15-2021 Frederik Schmidt, RN (Cardiology). Anti coag visit.  07-05-2021 Joya San, MD (General surgery). Order placed for xray of upper GI.   06-27-2021 Tanna Furry (Social worker). No changes.  Hospital visits:  None in previous 6 months  Medications: Outpatient Encounter Medications as of 07/26/2021  Medication Sig   amLODipine (NORVASC) 5 MG tablet Take 1 tablet by mouth once daily   B Complex-C (B-COMPLEX WITH VITAMIN C) tablet Take 1 tablet by mouth 3 (three) times a week.   Biotin 10000 MCG TABS Take 10,000 mcg by mouth daily.   Cholecalciferol 5000 units TABS Take 5,000 Units by mouth daily.   colchicine 0.6 MG tablet Take 1 tablet by mouth for gout attack.   diclofenac Sodium (VOLTAREN) 1 % GEL APPLY 2 GRAMS TO AFFECTED AREA(S) 4 TIMES DAILY AS NEEDED (Patient taking differently: Apply 2-4 g topically every 6 (six) hours as needed (to affected areas).)   gabapentin (NEURONTIN) 300 MG capsule Take 600 mg by mouth at bedtime.   glucose blood (ONETOUCH VERIO) test strip USE TO CHECK BLOOD SUGAR   TWO TIMES A DAY AS         INSTRUCTED   Insulin Degludec (TRESIBA) 100 UNIT/ML SOLN Inject 8 Units/oz/day into the skin. Patient is taking at bedtime   Lancets (ONETOUCH DELICA PLUS [id number]) MISC USE TO CHECK BLOOD SUGARS  TWO TIMES A DAY AS         INSTRUCTED   levothyroxine (SYNTHROID) 25 MCG tablet Take 25 mcg by mouth daily before breakfast. Taking 1 tablet every other day, and a 1/2 tablet on other days.   losartan (COZAAR) 100 MG tablet TAKE 1 TABLET DAILY   metoprolol succinate (TOPROL-XL) 25 MG 24 hr tablet TAKE 1 TABLET AT BEDTIME   (DISCONTINUE COREG)   Multiple Vitamins-Minerals  (MULTIVITAMIN WITH MINERALS) tablet Take 1 tablet by mouth daily. Women's 50+   Semaglutide, 1 MG/DOSE, (OZEMPIC, 1 MG/DOSE,) 4 MG/3ML SOPN Inject 1 mg into the skin once a week.   Semaglutide, 1 MG/DOSE, (OZEMPIC, 1 MG/DOSE,) 4 MG/3ML SOPN Inject 1 mg into the skin once a week.   simvastatin (ZOCOR) 10 MG tablet Take 1 tablet (10 mg total) by mouth daily.   traMADol (ULTRAM) 50 MG tablet Take 1 tablet (50 mg total) by mouth every 6 (six) hours as needed.   warfarin (COUMADIN) 5 MG tablet Take 1 tablet (5 mg total) by mouth daily.   [DISCONTINUED] SEMGLEE, YFGN, 100 UNIT/ML Pen Inject 9 Units into the skin at bedtime.   No facility-administered encounter medications on file as of 07/26/2021.  Recent Relevant Labs: Lab Results  Component Value Date/Time   HGBA1C 6.5 (H) 02/27/2021 10:30 AM   HGBA1C 6.3 (H) 12/25/2020 12:26 PM   MICROALBUR 30 02/27/2021 10:48 AM   MICROALBUR 10 02/09/2020 10:59 AM    Kidney Function Lab Results  Component Value Date/Time   CREATININE 1.02 (H) 03/28/2021 09:57 AM   CREATININE 1.11 (H) 02/27/2021 10:30 AM   CREATININE 1.23 (H) 12/25/2020 12:26 PM   GFRNONAA 57 (L) 03/28/2021 09:57 AM   GFRAA 67 11/29/2019 02:11 PM    Current antihyperglycemic regimen:  Tresiba 8 units daily Ozempic 1 mg weekly  What recent interventions/DTPs have  been made to improve glycemic control:  Educated on Complications of diabetes including kidney damage, retinal damage, and cardiovascular disease; -Counseled to check feet daily and get yearly eye exams -Recommended to continue current medication  Have there been any recent hospitalizations or ED visits since last visit with CPP? No  Patient denies hypoglycemic symptoms  Patient denies hyperglycemic symptoms  How often are you checking your blood sugar? once daily  What are your blood sugars ranging?  Fasting: 116, 108, 127 Before meals: None After meals: None Bedtime: None  During the week, how often does your  blood glucose drop below 70? Never  Are you checking your feet daily/regularly? Daily   Adherence Review: Is the patient currently on a STATIN medication? Yes Is the patient currently on ACE/ARB medication? Yes Does the patient have >5 day gap between last estimated fill dates? Yes  Care Gaps: Covid booster overdue AWV 10-10-2021  Star Rating Drugs: Ozempic 1 mg- Patient assistance Simvastatin 10 mg- Last filled 06-25-2021 90 DS [facility] Losartan 100 mg- Last filled 04-17-2021 90 DS [facility] (Patient stated she has a few pills left. [facility] and there's 1 refill left they will process)  [place] Clinical Pharmacist Assistant [phone]

## 2021-07-29 ENCOUNTER — Telehealth: Payer: Self-pay

## 2021-07-30 ENCOUNTER — Ambulatory Visit: Payer: Medicare HMO | Admitting: Addiction (Substance Use Disorder)

## 2021-07-30 ENCOUNTER — Telehealth: Payer: Self-pay

## 2021-07-30 ENCOUNTER — Ambulatory Visit
Admission: RE | Admit: 2021-07-30 | Discharge: 2021-07-30 | Disposition: A | Discharge status: Home / Self Care | Payer: Medicare HMO | Source: Ambulatory Visit | Attending: Internal Medicine | Admitting: Internal Medicine

## 2021-07-30 DIAGNOSIS — Z1231 Encounter for screening mammogram for malignant neoplasm of breast: Secondary | ICD-10-CM

## 2021-07-30 NOTE — Chronic Care Management (AMB) (Signed)
Novo Nordisk patient assistance program notification:  Patient is enrolled in auto-refill, 120- day supply of Ozempic '1mg'$ /ml will be filled on 08/04/2021 and  should arrive to the office in 10-14 business days. Patient enrollment will expire on 01/23/2022.  [name], [place] Pharmacist Assistant [phone]

## 2021-07-31 ENCOUNTER — Other Ambulatory Visit: Payer: Self-pay | Admitting: Internal Medicine

## 2021-07-31 ENCOUNTER — Encounter: Payer: Self-pay | Admitting: Internal Medicine

## 2021-08-02 NOTE — Telephone Encounter (Signed)
Called patient to follow up on patient assistance.  [name], CPP, PharmD Clinical Pharmacist Practitioner [facility] [phone]

## 2021-08-02 NOTE — Telephone Encounter (Signed)
Chmg-error.  

## 2021-08-06 ENCOUNTER — Other Ambulatory Visit: Payer: Self-pay | Admitting: *Deleted

## 2021-08-06 DIAGNOSIS — Z7901 Long term (current) use of anticoagulants: Secondary | ICD-10-CM

## 2021-08-06 MED ORDER — WARFARIN SODIUM 5 MG PO TABS: ORAL_TABLET | ORAL | 2 refills | Status: DC

## 2021-08-13 ENCOUNTER — Ambulatory Visit: Payer: Medicare HMO | Admitting: Addiction (Substance Use Disorder)

## 2021-08-20 ENCOUNTER — Telehealth: Payer: Self-pay

## 2021-08-20 NOTE — Chronic Care Management (AMB) (Signed)
Novo Nordisk patient assistance program notification:  120- day supply of Novofine needles will be filled on 08/31/2021 and should arrive to the office in 10-14 business days. Patient enrollment will expire on 01/23/2022.  [name], [place] Pharmacist Assistant [phone]

## 2021-08-23 DIAGNOSIS — I129 Hypertensive chronic kidney disease with stage 1 through stage 4 chronic kidney disease, or unspecified chronic kidney disease: Secondary | ICD-10-CM

## 2021-08-23 DIAGNOSIS — N1831 Chronic kidney disease, stage 3a: Secondary | ICD-10-CM

## 2021-08-23 DIAGNOSIS — E1122 Type 2 diabetes mellitus with diabetic chronic kidney disease: Secondary | ICD-10-CM

## 2021-08-23 DIAGNOSIS — Z794 Long term (current) use of insulin: Secondary | ICD-10-CM

## 2021-08-23 DIAGNOSIS — N182 Chronic kidney disease, stage 2 (mild): Secondary | ICD-10-CM

## 2021-08-26 ENCOUNTER — Telehealth: Payer: Self-pay

## 2021-08-26 NOTE — Chronic Care Management (AMB) (Signed)
Novo Nordisk patient assistance program notification:  120- day supply of Ozempic 1 mg will be hipped on 08/05/2021 and should arrive to the office in 10-14 business days. Patient has 0  refill remaining and a reorder request form assigned to [facility] to fill out, will get provider signature and fax to continue patient assistance refills.   [name], [place] Pharmacist Assistant [phone]

## 2021-08-28 ENCOUNTER — Telehealth: Payer: Self-pay

## 2021-08-28 NOTE — Chronic Care Management (AMB) (Signed)
08-28-2021: Reorder form for ozempic was completed and uploaded.  [place] Pharmacist Assistant [phone]

## 2021-08-28 NOTE — Chronic Care Management (AMB) (Signed)
Faxed Reorder form for [employer] to [employer] patient assistance program.  [name], [place] Pharmacist Assistant [phone]

## 2021-08-29 ENCOUNTER — Telehealth: Payer: Self-pay

## 2021-08-29 NOTE — Chronic Care Management (AMB) (Signed)
Chronic Care Management Pharmacy Assistant   Name: Teresa Golden  MRN: [id number] DOB: 20-Feb-1946  Reason for Encounter: Disease State/ Hypertension   Recent office visits:  None  Recent consult visits:  None  Hospital visits:  None in previous 6 months  Medications: Outpatient Encounter Medications as of 08/29/2021  Medication Sig   amLODipine (NORVASC) 5 MG tablet Take 1 tablet by mouth once daily   B Complex-C (B-COMPLEX WITH VITAMIN C) tablet Take 1 tablet by mouth 3 (three) times a week.   Biotin 10000 MCG TABS Take 10,000 mcg by mouth daily.   Cholecalciferol 5000 units TABS Take 5,000 Units by mouth daily.   colchicine 0.6 MG tablet Take 1 tablet by mouth for gout attack.   diclofenac Sodium (VOLTAREN) 1 % GEL APPLY 2 GRAMS TO AFFECTED AREA(S) 4 TIMES DAILY AS NEEDED (Patient taking differently: Apply 2-4 g topically every 6 (six) hours as needed (to affected areas).)   gabapentin (NEURONTIN) 300 MG capsule Take 600 mg by mouth at bedtime.   glucose blood (ONETOUCH VERIO) test strip USE TO CHECK BLOOD SUGAR   TWO TIMES A DAY AS         INSTRUCTED   Insulin Degludec (TRESIBA) 100 UNIT/ML SOLN Inject 8 Units/oz/day into the skin. Patient is taking at bedtime   Lancets (ONETOUCH DELICA PLUS [id number]) MISC USE TO CHECK BLOOD SUGARS  TWO TIMES A DAY AS         INSTRUCTED   levothyroxine (SYNTHROID) 25 MCG tablet Take 25 mcg by mouth daily before breakfast. Taking 1 tablet every other day, and a 1/2 tablet on other days.   losartan (COZAAR) 100 MG tablet TAKE 1 TABLET DAILY   metoprolol succinate (TOPROL-XL) 25 MG 24 hr tablet TAKE 1 TABLET AT BEDTIME   (DISCONTINUE COREG)   Multiple Vitamins-Minerals (MULTIVITAMIN WITH MINERALS) tablet Take 1 tablet by mouth daily. Women's 50+   Semaglutide, 1 MG/DOSE, (OZEMPIC, 1 MG/DOSE,) 4 MG/3ML SOPN Inject 1 mg into the skin once a week.   Semaglutide, 1 MG/DOSE, (OZEMPIC, 1 MG/DOSE,) 4 MG/3ML SOPN Inject 1 mg into the skin once a week.    simvastatin (ZOCOR) 10 MG tablet Take 1 tablet (10 mg total) by mouth daily.   traMADol (ULTRAM) 50 MG tablet Take 1 tablet (50 mg total) by mouth every 6 (six) hours as needed.   warfarin (COUMADIN) 5 MG tablet Take 1 tablet daily except 1.5 tablets on Monday, Wednesday, Friday or as directed by Anticoagulation Clinic.   [DISCONTINUED] SEMGLEE, YFGN, 100 UNIT/ML Pen Inject 9 Units into the skin at bedtime.   No facility-administered encounter medications on file as of 08/29/2021.  Reviewed chart prior to disease state call. Spoke with patient regarding BP  Recent Office Vitals: BP Readings from Last 3 Encounters:  06/07/21 122/68  05/02/21 120/76  03/28/21 (!) 159/68   Pulse Readings from Last 3 Encounters:  06/07/21 (!) 56  05/02/21 60  03/28/21 78    Wt Readings from Last 3 Encounters:  06/07/21 198 lb 12.8 oz (90.2 kg)  05/02/21 200 lb 9.6 oz (91 kg)  03/28/21 199 lb 14.4 oz (90.7 kg)     Kidney Function Lab Results  Component Value Date/Time   CREATININE 1.02 (H) 03/28/2021 09:57 AM   CREATININE 1.11 (H) 02/27/2021 10:30 AM   CREATININE 1.23 (H) 12/25/2020 12:26 PM   GFRNONAA 57 (L) 03/28/2021 09:57 AM   GFRAA 67 11/29/2019 02:11 PM       Latest Ref  Rng & Units 05/02/2021    3:08 PM 03/28/2021    9:57 AM 02/27/2021   10:30 AM  BMP  Glucose 70 - 99 mg/dL  173  120   BUN 8 - 23 mg/dL  15  21   Creatinine 0.44 - 1.00 mg/dL  1.02  1.11   BUN/Creat Ratio 12 - 28   19   Sodium 135 - 145 mmol/L  140  139   Potassium 3.5 - 5.1 mmol/L  3.9  3.9   Chloride 98 - 111 mmol/L  107  105   CO2 22 - 32 mmol/L  25  22   Calcium 8.7 - 10.3 mg/dL 9.4  9.5  9.9     Current antihypertensive regimen:  Losartan 100 mg daily Amlodipine 5 mg daily  How often are you checking your Blood Pressure? 1-2x per week  Current home BP readings: 130/79, 128/72, 123/71  What recent interventions/DTPs have been made by any provider to improve Blood Pressure control since last CPP Visit:   Counseled to monitor BP at home once per day, document, and provide log at future appointments -Recommended to continue current medication  Any recent hospitalizations or ED visits since last visit with CPP? No  What diet changes have been made to improve Blood Pressure Control?  Patient states she limits her sodium intake and drinks plenty of water daily.  What exercise is being done to improve your Blood Pressure Control?  Patient states she goes to water aerobics once weekly. She is gradually working on going more since after her surgery.  Adherence Review: Is the patient currently on ACE/ARB medication? Yes Does the patient have >5 day gap between last estimated fill dates? No   Care Gaps: Covid booster overdue AWV 10-10-2021  Star Rating Drugs: Ozempic 1 mg- Patient assistance Simvastatin 10 mg- Last filled 06-25-2021 90 DS [facility] Losartan 100 mg- Last filled 07-30-2021 90 DS [facility]  [place] Clinical Pharmacist Assistant [phone]

## 2021-08-30 ENCOUNTER — Ambulatory Visit (INDEPENDENT_AMBULATORY_CARE_PROVIDER_SITE_OTHER): Payer: Medicare HMO | Admitting: *Deleted

## 2021-08-30 DIAGNOSIS — M25472 Effusion, left ankle: Secondary | ICD-10-CM | POA: Diagnosis not present

## 2021-08-30 DIAGNOSIS — Z5181 Encounter for therapeutic drug level monitoring: Secondary | ICD-10-CM

## 2021-08-30 DIAGNOSIS — I82452 Acute embolism and thrombosis of left peroneal vein: Secondary | ICD-10-CM

## 2021-08-30 DIAGNOSIS — M25471 Effusion, right ankle: Secondary | ICD-10-CM | POA: Diagnosis not present

## 2021-08-30 LAB — POCT INR: INR: 2 (ref 2.0–3.0)

## 2021-08-30 NOTE — Patient Instructions (Addendum)
Description   Continue 1 tablet Daily, except 1.5 tablets on Monday, Wednesday, and Friday  INR 6 weeks. Coumadin Clinic [phone]

## 2021-09-03 ENCOUNTER — Ambulatory Visit: Payer: Medicare HMO | Admitting: Addiction (Substance Use Disorder)

## 2021-09-11 ENCOUNTER — Telehealth: Payer: Self-pay

## 2021-09-11 NOTE — Chronic Care Management (AMB) (Signed)
Novo Nordisk patient assistance program notification:  120- day supply of Novofine 32G needles was filled on 09/03/2021 and should arrive to the office in 10-14 business days. Patient has 1  refill remaining and enrollment will expire on 01/23/2022.  The next refill for patient will be fulfilled on 11/20/2021.  [name], [place] Pharmacist Assistant [phone]

## 2021-09-17 ENCOUNTER — Ambulatory Visit: Payer: Medicare HMO | Admitting: Addiction (Substance Use Disorder)

## 2021-09-19 ENCOUNTER — Telehealth: Payer: Self-pay

## 2021-09-19 NOTE — Chronic Care Management (AMB) (Signed)
09-19-2021: Patient was notified that novofine needles have arrived. Patient will pick up next week.  [place] Pharmacist Assistant [phone]

## 2021-09-26 ENCOUNTER — Inpatient Hospital Stay: Payer: Medicare HMO | Attending: Hematology and Oncology

## 2021-09-26 ENCOUNTER — Other Ambulatory Visit: Payer: Self-pay | Admitting: Hematology and Oncology

## 2021-09-26 ENCOUNTER — Other Ambulatory Visit: Payer: Self-pay

## 2021-09-26 ENCOUNTER — Inpatient Hospital Stay (HOSPITAL_BASED_OUTPATIENT_CLINIC_OR_DEPARTMENT_OTHER): Payer: Medicare HMO | Admitting: Hematology and Oncology

## 2021-09-26 VITALS — BP 158/70 | HR 73 | Temp 97.7°F | Resp 18 | Wt 206.7 lb

## 2021-09-26 DIAGNOSIS — Z9049 Acquired absence of other specified parts of digestive tract: Secondary | ICD-10-CM | POA: Diagnosis not present

## 2021-09-26 DIAGNOSIS — Z8249 Family history of ischemic heart disease and other diseases of the circulatory system: Secondary | ICD-10-CM | POA: Insufficient documentation

## 2021-09-26 DIAGNOSIS — N183 Chronic kidney disease, stage 3 unspecified: Secondary | ICD-10-CM | POA: Diagnosis not present

## 2021-09-26 DIAGNOSIS — I82552 Chronic embolism and thrombosis of left peroneal vein: Secondary | ICD-10-CM | POA: Diagnosis not present

## 2021-09-26 DIAGNOSIS — Z79899 Other long term (current) drug therapy: Secondary | ICD-10-CM | POA: Diagnosis not present

## 2021-09-26 DIAGNOSIS — Z87891 Personal history of nicotine dependence: Secondary | ICD-10-CM | POA: Insufficient documentation

## 2021-09-26 DIAGNOSIS — Z7901 Long term (current) use of anticoagulants: Secondary | ICD-10-CM | POA: Insufficient documentation

## 2021-09-26 DIAGNOSIS — I129 Hypertensive chronic kidney disease with stage 1 through stage 4 chronic kidney disease, or unspecified chronic kidney disease: Secondary | ICD-10-CM | POA: Insufficient documentation

## 2021-09-26 DIAGNOSIS — Z823 Family history of stroke: Secondary | ICD-10-CM | POA: Diagnosis not present

## 2021-09-26 DIAGNOSIS — I82502 Chronic embolism and thrombosis of unspecified deep veins of left lower extremity: Secondary | ICD-10-CM | POA: Diagnosis not present

## 2021-09-26 DIAGNOSIS — Z888 Allergy status to other drugs, medicaments and biological substances status: Secondary | ICD-10-CM | POA: Insufficient documentation

## 2021-09-26 DIAGNOSIS — Z79890 Hormone replacement therapy (postmenopausal): Secondary | ICD-10-CM | POA: Insufficient documentation

## 2021-09-26 DIAGNOSIS — E1122 Type 2 diabetes mellitus with diabetic chronic kidney disease: Secondary | ICD-10-CM | POA: Diagnosis not present

## 2021-09-26 LAB — CBC WITH DIFFERENTIAL (CANCER CENTER ONLY)
Abs Immature Granulocytes: 0.03 10*3/uL (ref 0.00–0.07)
Basophils Absolute: 0.1 10*3/uL (ref 0.0–0.1)
Basophils Relative: 1 %
Eosinophils Absolute: 0.3 10*3/uL (ref 0.0–0.5)
Eosinophils Relative: 4 %
HCT: 32.5 % — ABNORMAL LOW (ref 36.0–46.0)
Hemoglobin: 11.2 g/dL — ABNORMAL LOW (ref 12.0–15.0)
Immature Granulocytes: 0 %
Lymphocytes Relative: 32 %
Lymphs Abs: 2.5 10*3/uL (ref 0.7–4.0)
MCH: 27.7 pg (ref 26.0–34.0)
MCHC: 34.5 g/dL (ref 30.0–36.0)
MCV: 80.4 fL (ref 80.0–100.0)
Monocytes Absolute: 0.7 10*3/uL (ref 0.1–1.0)
Monocytes Relative: 9 %
Neutro Abs: 4.2 10*3/uL (ref 1.7–7.7)
Neutrophils Relative %: 54 %
Platelet Count: 176 10*3/uL (ref 150–400)
RBC: 4.04 MIL/uL (ref 3.87–5.11)
RDW: 15.4 % (ref 11.5–15.5)
WBC Count: 7.8 10*3/uL (ref 4.0–10.5)
nRBC: 0 % (ref 0.0–0.2)

## 2021-09-26 LAB — CMP (CANCER CENTER ONLY)
ALT: 15 U/L (ref 0–44)
AST: 21 U/L (ref 15–41)
Albumin: 3.8 g/dL (ref 3.5–5.0)
Alkaline Phosphatase: 77 U/L (ref 38–126)
Anion gap: 6 (ref 5–15)
BUN: 17 mg/dL (ref 8–23)
CO2: 27 mmol/L (ref 22–32)
Calcium: 9 mg/dL (ref 8.9–10.3)
Chloride: 106 mmol/L (ref 98–111)
Creatinine: 1.01 mg/dL — ABNORMAL HIGH (ref 0.44–1.00)
GFR, Estimated: 57 mL/min — ABNORMAL LOW
Glucose, Bld: 167 mg/dL — ABNORMAL HIGH (ref 70–99)
Potassium: 3.7 mmol/L (ref 3.5–5.1)
Sodium: 139 mmol/L (ref 135–145)
Total Bilirubin: 0.3 mg/dL (ref 0.3–1.2)
Total Protein: 7 g/dL (ref 6.5–8.1)

## 2021-09-26 NOTE — Progress Notes (Signed)
[place] [phone]   [phone]  PROGRESS NOTE  Patient Care Team: Glendale Chard, MD as PCP - General (Internal Medicine) Elouise Munroe, MD as PCP - Cardiology (Cardiology) Rex Kras Claudette Stapler, RN as Case Manager Mayford Knife, [facility] (Pharmacist)  Hematological/Oncological History # Provoked Left Peroneal Vein DVT 11/26/2020: [place] LE showed age indeterminate deep vein thrombosis involving the left peroneal veins. Started on Xarelto therapy.  03/07/2021: repeat bilateral LE US showed age indeterminate deep vein thrombosis involving the left peroneal veins. Thrombus appears to be isolated to one of the paired peroneal veins in the mid calf. 03/28/2021: establish care with Dr. Lorenso Courier.  Transition to Coumadin therapy.  Interval History:  Teresa Golden 76 y.o. female with medical history significant for chronic DVT of the left lower extremity who presents for a follow up visit. The patient's last visit was on 03/28/2021 at which time she established care.. In the interim since the last visit she is continued on Coumadin therapy without difficulty.  On exam today Ms. Alsteen reports that over the last 6 months she has been quite well.  She been taking her Coumadin and keeping it well within the INR range.  She notes that she is not having any swelling or pain in her lower extremities as he does have chronic neuropathy.  She reports that she is not having any bleeding or bruising while on Eliquis therapy.  She notes that she is eager to discontinue Coumadin as she cannot take Aleve while on the medication.  She reports that overall she has felt well with no chest pain or shortness of breath.  She denies any fevers, chills, sweats, nausea, vomiting or diarrhea.  A full 10 point ROS is listed below.  The bulk of our discussion today focused on the risks and benefits of discontinuation of anticoagulation therapy.  Given that this was a provoked VTE I do believe would  be reasonable to discontinue at this time.  I also noted that we would need to take precautions if she were to have surgery or be immobilized again in the future.  The patient voiced understanding of these precautions.  MEDICAL HISTORY:  Past Medical History:  Diagnosis Date   Arthritis    Chronic kidney disease    self reports ckd stage 3    Depression    Diabetes mellitus, type II, insulin dependent ([place])    With neurologic complications. 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    Hypertensive nephropathy 07/05/2018   Hypothyroidism    Hypothyroidism 02/06/2018   Morbid obesity with BMI of 50.0-59.9, adult ([place])    Scoliosis     SURGICAL HISTORY: Past Surgical History:  Procedure Laterality Date   ABDOMINAL HYSTERECTOMY     uterus   CHOLECYSTECTOMY     DIAGNOSTIC LAPAROSCOPY     DILATION AND CURETTAGE OF UTERUS     EYE SURGERY     cataract extraction bilateral    INCISION AND DRAINAGE OF WOUND N/A 10/15/2020   Procedure: IRRIGATION AND DEBRIDEMENT WOUND;  Surgeon: Wallace Going, DO;  Location: [facility];  Service: Plastics;  Laterality: N/A;  60 min   JOINT REPLACEMENT     Left knee  KNEE ARTHROPLASTY Right 07/30/2017   Procedure: RIGHT TOTAL KNEE ARTHROPLASTY WITH COMPUTER NAVIGATION;  Surgeon: Rod Can, MD;  Location: [facility];  Service: Orthopedics;  Laterality: Right;  Needs RNFA   LAPAROSCOPIC GASTRIC BANDING     and reversal   LAPAROSCOPIC GASTRIC SLEEVE RESECTION N/A 08/26/2016   Procedure: LAPAROSCOPIC GASTRIC SLEEVE RESECTION WITH UPPER ENOD;  Surgeon: Johnathan Hausen, MD;  Location: [facility];  Service: General;  Laterality: N/A;   PANNICULECTOMY N/A 09/26/2020   Procedure: PANNICULECTOMY;  Surgeon: Wallace Going,  DO;  Location: [facility];  Service: Plastics;  Laterality: N/A;   TONSILLECTOMY     TRANSTHORACIC ECHOCARDIOGRAM  11/2013   EF 65-70%. Normal diastolic Fxn.  Normal Valves.   UMBILICAL HERNIA REPAIR N/A 09/26/2020   Procedure: HERNIA REPAIR UMBILICAL ADULT;  Surgeon: Georganna Skeans, MD;  Location: [place];  Service: General;  Laterality: N/A;    SOCIAL HISTORY: Social History   Socioeconomic History   Marital status: Single    Spouse name: Not on file   Number of children: Not on file   Years of education: Not on file   Highest education level: Not on file  Occupational History   Occupation: retired  Tobacco Use   Smoking status: Former    Packs/day: 0.25    Years: 10.00    Total pack years: 2.50    Types: Cigarettes   Smokeless tobacco: Never   Tobacco comments:    quit 20 years ago  Vaping Use   Vaping Use: Never used  Substance and Sexual Activity   Alcohol use: Not Currently    Alcohol/week: 1.0 standard drink of alcohol    Types: 1 Glasses of wine per week    Comment: occasional   Drug use: No   Sexual activity: Yes  Other Topics Concern   Not on file  Social History Narrative   Not on file   Social Determinants of Health   Financial Resource Strain: Low Risk  (09/20/2020)   Overall Financial Resource Strain (CARDIA)    Difficulty of Paying Living Expenses: Not hard at Teresa  Recent Concern: Financial Resource Strain - High Risk (08/15/2020)   Overall Financial Resource Strain (CARDIA)    Difficulty of Paying Living Expenses: Very hard  Food Insecurity: No Food Insecurity (03/28/2021)   Hunger Vital Sign    Worried About Running Out of Food in the Last Year: Never true    [place] in the Last Year: Never true  Transportation Needs: No Transportation Needs (05/20/2021)   PRAPARE - Hydrologist (Medical): No    Lack of Transportation (Non-Medical): No  Physical Activity: Insufficiently Active (09/20/2020)   Exercise Vital Sign    Days  of Exercise per Week: 3 days    Minutes of Exercise per Session: 40 min  Stress: No Stress Concern Present (09/20/2020)   [place]    Feeling of Stress : Not at Teresa  Social Connections: Unknown (07/15/2018)   Social Connection and Isolation Panel [NHANES]    Frequency of Communication with Friends and Family: Three times a week    Frequency of Social Gatherings with Friends and Family: Never    Attends Religious Services: More than 4 times per year    Active Member of [employer] or Organizations: Yes    Attends Music therapist: More than 4 times per year    Marital Status: Not on file  Intimate Partner  Violence: Not At Risk (07/21/2019)   Humiliation, Afraid, Rape, and Kick questionnaire    Fear of Current or Ex-Partner: No    Emotionally Abused: No    Physically Abused: No    Sexually Abused: No    FAMILY HISTORY: Family History  Problem Relation Age of Onset   Heart attack Mother    Heart failure Mother    Stroke Father     ALLERGIES:  is allergic to meloxicam and other.  MEDICATIONS:  Current Outpatient Medications  Medication Sig Dispense Refill   amLODipine (NORVASC) 5 MG tablet Take 1 tablet by mouth once daily 90 tablet 1   B Complex-C (B-COMPLEX WITH VITAMIN C) tablet Take 1 tablet by mouth 3 (three) times a week.     Biotin 10000 MCG TABS Take 10,000 mcg by mouth daily.     Cholecalciferol 5000 units TABS Take 5,000 Units by mouth daily.     colchicine 0.6 MG tablet Take 1 tablet by mouth for gout attack. 30 tablet 1   diclofenac Sodium (VOLTAREN) 1 % GEL APPLY 2 GRAMS TO AFFECTED AREA(S) 4 TIMES DAILY AS NEEDED (Patient taking differently: Apply 2-4 g topically every 6 (six) hours as needed (to affected areas).) 200 g 2   gabapentin (NEURONTIN) 300 MG capsule Take 600 mg by mouth at bedtime.     glucose blood (ONETOUCH VERIO) test strip USE TO CHECK BLOOD SUGAR   TWO TIMES A DAY AS          INSTRUCTED 200 strip 3   Insulin Degludec (TRESIBA) 100 UNIT/ML SOLN Inject 8 Units/oz/day into the skin. Patient is taking at bedtime     Lancets (ONETOUCH DELICA PLUS [id number]) MISC USE TO CHECK BLOOD SUGARS  TWO TIMES A DAY AS         INSTRUCTED 200 each 3   levothyroxine (SYNTHROID) 25 MCG tablet Take 25 mcg by mouth daily before breakfast. Taking 1 tablet every other day, and a 1/2 tablet on other days.     losartan (COZAAR) 100 MG tablet TAKE 1 TABLET DAILY 90 tablet 2   metoprolol succinate (TOPROL-XL) 25 MG 24 hr tablet TAKE 1 TABLET AT BEDTIME   (DISCONTINUE COREG) 90 tablet 1   Multiple Vitamins-Minerals (MULTIVITAMIN WITH MINERALS) tablet Take 1 tablet by mouth daily. Women's 50+     Semaglutide, 1 MG/DOSE, (OZEMPIC, 1 MG/DOSE,) 4 MG/3ML SOPN Inject 1 mg into the skin once a week. 9 mL 1   Semaglutide, 1 MG/DOSE, (OZEMPIC, 1 MG/DOSE,) 4 MG/3ML SOPN Inject 1 mg into the skin once a week. 9 mL 1   simvastatin (ZOCOR) 10 MG tablet Take 1 tablet (10 mg total) by mouth daily. 90 tablet 1   traMADol (ULTRAM) 50 MG tablet Take 1 tablet (50 mg total) by mouth every 6 (six) hours as needed. 20 tablet 0   warfarin (COUMADIN) 5 MG tablet Take 1 tablet daily except 1.5 tablets on Monday, Wednesday, Friday or as directed by Anticoagulation Clinic. 50 tablet 2   No current facility-administered medications for this visit.    REVIEW OF SYSTEMS:   Constitutional: ( - ) fevers, ( - )  chills , ( - ) night sweats Eyes: ( - ) blurriness of vision, ( - ) double vision, ( - ) watery eyes Ears, nose, mouth, throat, and face: ( - ) mucositis, ( - ) sore throat Respiratory: ( - ) cough, ( - ) dyspnea, ( - ) wheezes Cardiovascular: ( - ) palpitation, ( - )  chest discomfort, ( - ) lower extremity swelling Gastrointestinal:  ( - ) nausea, ( - ) heartburn, ( - ) change in bowel habits Skin: ( - ) abnormal skin rashes Lymphatics: ( - ) new lymphadenopathy, ( - ) easy bruising Neurological: ( - ) numbness, (  - ) tingling, ( - ) new weaknesses Behavioral/Psych: ( - ) mood change, ( - ) new changes  Teresa other systems were reviewed with the patient and are negative.  PHYSICAL EXAMINATION:  Vitals:   09/26/21 1500  BP: (!) 158/70  Pulse: 73  Resp: 18  Temp: 97.7 F (36.5 C)  SpO2: 99%   Filed Weights   09/26/21 1500  Weight: 206 lb 11.2 oz (93.8 kg)    GENERAL: alert, no distress and comfortable SKIN: skin color, texture, turgor are normal, no rashes or significant lesions EYES: conjunctiva are pink and non-injected, sclera clear OROPHARYNX: no exudate, no erythema; lips, buccal mucosa, and tongue normal  NECK: supple, non-tender LYMPH:  no palpable lymphadenopathy in the cervical, axillary or inguinal LUNGS: clear to auscultation and percussion with normal breathing effort HEART: regular rate & rhythm and no murmurs and no lower extremity edema ABDOMEN: soft, non-tender, non-distended, normal bowel sounds Musculoskeletal: no cyanosis of digits and no clubbing  PSYCH: alert & oriented x 3, fluent speech NEURO: no focal motor/sensory deficits  LABORATORY DATA:  I have reviewed the data as listed    Latest Ref Rng & Units 09/26/2021    1:56 PM 03/28/2021    9:57 AM 02/27/2021   10:30 AM  CBC  WBC 4.0 - 10.5 K/uL 7.8  8.2  9.7   Hemoglobin 12.0 - 15.0 g/dL 11.2  11.7  11.5   Hematocrit 36.0 - 46.0 % 32.5  35.0  32.8   Platelets 150 - 400 K/uL 176  228  208        Latest Ref Rng & Units 09/26/2021    1:56 PM 05/02/2021    3:08 PM 03/28/2021    9:57 AM  CMP  Glucose 70 - 99 mg/dL 167   173   BUN 8 - 23 mg/dL 17   15   Creatinine 0.44 - 1.00 mg/dL 1.01   1.02   Sodium 135 - 145 mmol/L 139   140   Potassium 3.5 - 5.1 mmol/L 3.7   3.9   Chloride 98 - 111 mmol/L 106   107   CO2 22 - 32 mmol/L 27   25   Calcium 8.9 - 10.3 mg/dL 9.0  9.4  9.5   Total Protein 6.5 - 8.1 g/dL 7.0  7.1  7.7   Total Bilirubin 0.3 - 1.2 mg/dL 0.3   0.5   Alkaline Phos 38 - 126 U/L 77   81   AST 15 - 41 U/L  21   25   ALT 0 - 44 U/L 15   19     RADIOGRAPHIC STUDIES: No results found.  ASSESSMENT & PLAN Teresa SOLORZANO 76 y.o. female with medical history significant for chronic DVT of the left lower extremity who presents for a follow up visit.  # Provoked Lower Extremity DVT -- At this time findings are consistent with a provoked VTE in the setting of prolonged hospitalization.  She was using SCDs but still developed a blood clot. --Patient was on Xarelto therapy, however she is undergone a gastric sleeve which may inhibit appropriate absorption of Xarelto. --Recommended she transition to Coumadin therapy instead.  She was able to  connect with a Coumadin clinic through her PCP. --She has completed 6 full months of anticoagulation therapy for a provoked VTE.  This time would be appropriate to discontinue Coumadin. --Strong precautions for prophylactic anticoagulation in the event that she were to be immobilized or have surgery.  Recommend she or her care team reach out to [place] for recommendations at that time. --Labs today show Finger blood cell count 7.8, hemoglobin 9.2, MCV 80.4, and platelets of 176.  Creatinine and LFTs within normal limits. --Return to clinic on an as-needed basis moving forward.  No orders of the defined types were placed in this encounter.   Teresa questions were answered. The patient knows to call the clinic with any problems, questions or concerns.  A total of more than 30 minutes were spent on this encounter with face-to-face time and non-face-to-face time, including preparing to see the patient, ordering tests and/or medications, counseling the patient and coordination of care as outlined above.   Ledell Peoples, MD Department of Hematology/Oncology [place] [facility] Phone: [phone] Pager: [phone] Email: [email or web address]  09/26/2021 4:47 PM

## 2021-10-02 ENCOUNTER — Other Ambulatory Visit: Payer: Self-pay | Admitting: Internal Medicine

## 2021-10-04 ENCOUNTER — Telehealth: Payer: Self-pay

## 2021-10-04 NOTE — Chronic Care Management (AMB) (Signed)
Novo Nordisk patient assistance program notification:  120- day supply of Ozempic 1 mg will be filled on 10/23/2021 and should arrive to the office in 10-14 business days.  Tamala Melvin, CMA Clinical Pharmacist Assistant [phone]  

## 2021-10-08 ENCOUNTER — Telehealth: Payer: Self-pay | Admitting: Internal Medicine

## 2021-10-08 NOTE — Telephone Encounter (Signed)
Patient stated that Dr. Lorenso Courier has discontinued her coumadin. Patient took last dose last week.

## 2021-10-08 NOTE — Telephone Encounter (Signed)
Patient called to report her doctor has stopped her taking Coumadin and she stated she will not be returning to the Coumadin clinic.

## 2021-10-10 ENCOUNTER — Encounter: Payer: Self-pay | Admitting: Internal Medicine

## 2021-10-10 ENCOUNTER — Ambulatory Visit (INDEPENDENT_AMBULATORY_CARE_PROVIDER_SITE_OTHER): Payer: Medicare HMO | Admitting: Internal Medicine

## 2021-10-10 ENCOUNTER — Ambulatory Visit (INDEPENDENT_AMBULATORY_CARE_PROVIDER_SITE_OTHER): Payer: Medicare HMO

## 2021-10-10 VITALS — BP 126/64 | HR 54 | Temp 97.8°F | Ht 65.0 in | Wt 204.2 lb

## 2021-10-10 VITALS — BP 128/62 | HR 54 | Temp 97.8°F | Ht 65.0 in | Wt 204.2 lb

## 2021-10-10 DIAGNOSIS — N1831 Chronic kidney disease, stage 3a: Secondary | ICD-10-CM | POA: Diagnosis not present

## 2021-10-10 DIAGNOSIS — E039 Hypothyroidism, unspecified: Secondary | ICD-10-CM

## 2021-10-10 DIAGNOSIS — E1122 Type 2 diabetes mellitus with diabetic chronic kidney disease: Secondary | ICD-10-CM

## 2021-10-10 DIAGNOSIS — Z794 Long term (current) use of insulin: Secondary | ICD-10-CM

## 2021-10-10 DIAGNOSIS — E66811 Obesity, class 1: Secondary | ICD-10-CM

## 2021-10-10 DIAGNOSIS — M10071 Idiopathic gout, right ankle and foot: Secondary | ICD-10-CM

## 2021-10-10 DIAGNOSIS — Z0000 Encounter for general adult medical examination without abnormal findings: Secondary | ICD-10-CM

## 2021-10-10 DIAGNOSIS — Z6833 Body mass index (BMI) 33.0-33.9, adult: Secondary | ICD-10-CM

## 2021-10-10 DIAGNOSIS — F419 Anxiety disorder, unspecified: Secondary | ICD-10-CM

## 2021-10-10 DIAGNOSIS — I129 Hypertensive chronic kidney disease with stage 1 through stage 4 chronic kidney disease, or unspecified chronic kidney disease: Secondary | ICD-10-CM | POA: Diagnosis not present

## 2021-10-10 DIAGNOSIS — E6609 Other obesity due to excess calories: Secondary | ICD-10-CM

## 2021-10-10 DIAGNOSIS — F5101 Primary insomnia: Secondary | ICD-10-CM

## 2021-10-10 MED ORDER — COLCHICINE 0.6 MG PO TABS: ORAL_TABLET | ORAL | 1 refills | Status: DC

## 2021-10-10 NOTE — Patient Instructions (Signed)
Teresa Golden , Thank you for taking time to come for your Medicare Wellness Visit. I appreciate your ongoing commitment to your health goals. Please review the following plan we discussed and let me know if I can assist you in the future.   Screening recommendations/referrals: Colonoscopy: not required Mammogram: completed 07/31/2021, due 08/02/2022 Bone Density: completed 04/26/2018 Recommended yearly ophthalmology/optometry visit for glaucoma screening and checkup Recommended yearly dental visit for hygiene and checkup  Vaccinations: Influenza vaccine: due Pneumococcal vaccine: completed 08/03/2020 Tdap vaccine: completed 07/04/2015, due 07/03/2025 Shingles vaccine: completed   Covid-19: 07/06/2020, 01/02/2020, 04/25/2019, 03/28/2019  Advanced directives: Please bring a copy of your POA (Power of Attorney) and/or Living Will to your next appointment.   Conditions/risks identified: none  Next appointment: Follow up in one year for your annual wellness visit    Preventive Care 65 Years and Older, Female Preventive care refers to lifestyle choices and visits with your health care provider that can promote health and wellness. What does preventive care include? A yearly physical exam. This is also called an annual well check. Dental exams once or twice a year. Routine eye exams. Ask your health care provider how often you should have your eyes checked. Personal lifestyle choices, including: Daily care of your teeth and gums. Regular physical activity. Eating a healthy diet. Avoiding tobacco and drug use. Limiting alcohol use. Practicing safe sex. Taking low-dose aspirin every day. Taking vitamin and mineral supplements as recommended by your health care provider. What happens during an annual well check? The services and screenings done by your health care provider during your annual well check will depend on your age, overall health, lifestyle risk factors, and family history of  disease. Counseling  Your health care provider may ask you questions about your: Alcohol use. Tobacco use. Drug use. Emotional well-being. Home and relationship well-being. Sexual activity. Eating habits. History of falls. Memory and ability to understand (cognition). Work and work Statistician. Reproductive health. Screening  You may have the following tests or measurements: Height, weight, and BMI. Blood pressure. Lipid and cholesterol levels. These may be checked every 5 years, or more frequently if you are over 8 years old. Skin check. Lung cancer screening. You may have this screening every year starting at age 75 if you have a 30-pack-year history of smoking and currently smoke or have quit within the past 15 years. Fecal occult blood test (FOBT) of the stool. You may have this test every year starting at age 10. Flexible sigmoidoscopy or colonoscopy. You may have a sigmoidoscopy every 5 years or a colonoscopy every 10 years starting at age 57. Hepatitis C blood test. Hepatitis B blood test. Sexually transmitted disease (STD) testing. Diabetes screening. This is done by checking your blood sugar (glucose) after you have not eaten for a while (fasting). You may have this done every 1-3 years. Bone density scan. This is done to screen for osteoporosis. You may have this done starting at age 42. Mammogram. This may be done every 1-2 years. Talk to your health care provider about how often you should have regular mammograms. Talk with your health care provider about your test results, treatment options, and if necessary, the need for more tests. Vaccines  Your health care provider may recommend certain vaccines, such as: Influenza vaccine. This is recommended every year. Tetanus, diphtheria, and acellular pertussis (Tdap, Td) vaccine. You may need a Td booster every 10 years. Zoster vaccine. You may need this after age 74. Pneumococcal 13-valent conjugate (PCV13) vaccine.  One  dose is recommended after age 6. Pneumococcal polysaccharide (PPSV23) vaccine. One dose is recommended after age 77. Talk to your health care provider about which screenings and vaccines you need and how often you need them. This information is not intended to replace advice given to you by your health care provider. Make sure you discuss any questions you have with your health care provider. Document Released: 03/09/2015 Document Revised: 10/31/2015 Document Reviewed: 12/12/2014 Elsevier Interactive Patient Education  2017 [place] Prevention in the Home Falls can cause injuries. They can happen to people of all ages. There are many things you can do to make your home safe and to help prevent falls. What can I do on the outside of my home? Regularly fix the edges of walkways and driveways and fix any cracks. Remove anything that might make you trip as you walk through a door, such as a raised step or threshold. Trim any bushes or trees on the path to your home. Use bright outdoor lighting. Clear any walking paths of anything that might make someone trip, such as rocks or tools. Regularly check to see if handrails are loose or broken. Make sure that both sides of any steps have handrails. Any raised decks and porches should have guardrails on the edges. Have any leaves, snow, or ice cleared regularly. Use sand or salt on walking paths during winter. Clean up any spills in your garage right away. This includes oil or grease spills. What can I do in the bathroom? Use night lights. Install grab bars by the toilet and in the tub and shower. Do not use towel bars as grab bars. Use non-skid mats or decals in the tub or shower. If you need to sit down in the shower, use a plastic, non-slip stool. Keep the floor dry. Clean up any water that spills on the floor as soon as it happens. Remove soap buildup in the tub or shower regularly. Attach bath mats securely with double-sided  non-slip rug tape. Do not have throw rugs and other things on the floor that can make you trip. What can I do in the bedroom? Use night lights. Make sure that you have a light by your bed that is easy to reach. Do not use any sheets or blankets that are too big for your bed. They should not hang down onto the floor. Have a firm chair that has side arms. You can use this for support while you get dressed. Do not have throw rugs and other things on the floor that can make you trip. What can I do in the kitchen? Clean up any spills right away. Avoid walking on wet floors. Keep items that you use a lot in easy-to-reach places. If you need to reach something above you, use a strong step stool that has a grab bar. Keep electrical cords out of the way. Do not use floor polish or wax that makes floors slippery. If you must use wax, use non-skid floor wax. Do not have throw rugs and other things on the floor that can make you trip. What can I do with my stairs? Do not leave any items on the stairs. Make sure that there are handrails on both sides of the stairs and use them. Fix handrails that are broken or loose. Make sure that handrails are as long as the stairways. Check any carpeting to make sure that it is firmly attached to the stairs. Fix any carpet that is loose or  worn. Avoid having throw rugs at the top or bottom of the stairs. If you do have throw rugs, attach them to the floor with carpet tape. Make sure that you have a light switch at the top of the stairs and the bottom of the stairs. If you do not have them, ask someone to add them for you. What else can I do to help prevent falls? Wear shoes that: Do not have high heels. Have rubber bottoms. Are comfortable and fit you well. Are closed at the toe. Do not wear sandals. If you use a stepladder: Make sure that it is fully opened. Do not climb a closed stepladder. Make sure that both sides of the stepladder are locked into place. Ask  someone to hold it for you, if possible. Clearly mark and make sure that you can see: Any grab bars or handrails. First and last steps. Where the edge of each step is. Use tools that help you move around (mobility aids) if they are needed. These include: Canes. Walkers. Scooters. Crutches. Turn on the lights when you go into a dark area. Replace any light bulbs as soon as they burn out. Set up your furniture so you have a clear path. Avoid moving your furniture around. If any of your floors are uneven, fix them. If there are any pets around you, be aware of where they are. Review your medicines with your doctor. Some medicines can make you feel dizzy. This can increase your chance of falling. Ask your doctor what other things that you can do to help prevent falls. This information is not intended to replace advice given to you by your health care provider. Make sure you discuss any questions you have with your health care provider. Document Released: 12/07/2008 Document Revised: 07/19/2015 Document Reviewed: 03/17/2014 Elsevier Interactive Patient Education  2017 [employer].

## 2021-10-10 NOTE — Patient Instructions (Signed)

## 2021-10-10 NOTE — Progress Notes (Signed)
Subjective:     [name] , female    DOB: 1946-02-02 , 76 y.o.   MRN: [id number]   Chief Complaint  Patient presents with   Diabetes   Hypertension    HPI  Patient presents today for a diabetes and BP check. She reports compliance with meds. States sugars are 114-119. Highest is 130 when she "cheats". She denies headaches, chest pain and shortness of breath.   She is also scheduled for AWV with [facility] Advisor.   Diabetes She presents for her follow-up diabetic visit. She has type 2 diabetes mellitus. Pertinent negatives for diabetes include no blurred vision and no chest pain. There are no hypoglycemic complications. Risk factors for coronary artery disease include diabetes mellitus, dyslipidemia, hypertension, obesity, sedentary lifestyle and post-menopausal. She is following a diabetic diet. She participates in exercise intermittently. Her home blood glucose trend is fluctuating minimally. Her breakfast blood glucose is taken between 8-9 am. Her breakfast blood glucose range is generally 110-130 mg/dl. An ACE inhibitor/angiotensin II receptor blocker is being taken. Eye exam is current.  Hypertension This is a chronic problem. The current episode started more than 1 year ago. The problem has been gradually improving since onset. The problem is uncontrolled. Pertinent negatives include no blurred vision or chest pain. Risk factors for coronary artery disease include diabetes mellitus, dyslipidemia, post-menopausal state and sedentary lifestyle. Past treatments include angiotensin blockers and beta blockers. The current treatment provides moderate improvement.     Past Medical History:  Diagnosis Date   Arthritis    Chronic kidney disease    self reports ckd stage 3    Depression    Diabetes mellitus, type II, insulin dependent ([place])    With neurologic complications. Bilateral lower extremity peripheral neuropathy   Dyspnea    with excertion; no issues now since  weight loss surgery    Endometriosis    Essential hypertension    GERD (gastroesophageal reflux disease)    Hepatitis C    C dormant; states she is in remission since taking Harvoni    Hypertensive nephropathy 07/05/2018   Hypothyroidism    Hypothyroidism 02/06/2018   Morbid obesity with BMI of 50.0-59.9, adult ([place])    Scoliosis      Family History  Problem Relation Age of Onset   Heart attack Mother    Heart failure Mother    Stroke Father      Current Outpatient Medications:    amLODipine (NORVASC) 5 MG tablet, Take 1 tablet by mouth once daily, Disp: 90 tablet, Rfl: 1   AZO-CRANBERRY PO, Take 1 tablet by mouth daily., Disp: , Rfl:    B Complex-C (B-COMPLEX WITH VITAMIN C) tablet, Take 1 tablet by mouth 3 (three) times a week., Disp: , Rfl:    Biotin 10000 MCG TABS, Take 10,000 mcg by mouth daily., Disp: , Rfl:    cetirizine (ZYRTEC) 10 MG tablet, Take 10 mg by mouth at bedtime., Disp: , Rfl:    Cholecalciferol 5000 units TABS, Take 5,000 Units by mouth daily., Disp: , Rfl:    diclofenac Sodium (VOLTAREN) 1 % GEL, APPLY 2 GRAMS TO AFFECTED AREA(S) 4 TIMES DAILY AS NEEDED (Patient taking differently: Apply 2-4 g topically every 6 (six) hours as needed (to affected areas).), Disp: 200 g, Rfl: 2   gabapentin (NEURONTIN) 300 MG capsule, Take 600 mg by mouth at bedtime., Disp: , Rfl:    glucose blood (ONETOUCH VERIO) test strip, USE TO CHECK BLOOD SUGAR  TWO TIMES A DAY AS         INSTRUCTED, Disp: 200 strip, Rfl: 3   Insulin Degludec (TRESIBA) 100 UNIT/ML SOLN, Inject 8 Units/oz/day into the skin. Patient is taking at bedtime, Disp: , Rfl:    Lancets (ONETOUCH DELICA PLUS SEGBTD17O) MISC, USE TO CHECK BLOOD SUGARS  TWO TIMES A DAY AS         INSTRUCTED, Disp: 200 each, Rfl: 3   levothyroxine (SYNTHROID) 25 MCG tablet, Take 25 mcg by mouth daily before breakfast. Taking 1 tablet every other day, and a 1/2 tablet on other days. (Patient not taking: Reported on 10/14/2021), Disp: , Rfl:     losartan (COZAAR) 100 MG tablet, TAKE 1 TABLET DAILY, Disp: 90 tablet, Rfl: 2   metoprolol succinate (TOPROL-XL) 25 MG 24 hr tablet, TAKE 1 TABLET AT BEDTIME   (DISCONTINUE COREG), Disp: 90 tablet, Rfl: 1   Multiple Vitamins-Minerals (MULTIVITAMIN WITH MINERALS) tablet, Take 1 tablet by mouth daily. Women's 50+, Disp: , Rfl:    naproxen sodium (ALEVE) 220 MG tablet, Take 220 mg by mouth at bedtime., Disp: , Rfl:    Semaglutide, 1 MG/DOSE, (OZEMPIC, 1 MG/DOSE,) 4 MG/3ML SOPN, Inject 1 mg into the skin once a week., Disp: 9 mL, Rfl: 1   simvastatin (ZOCOR) 10 MG tablet, Take 1 tablet (10 mg total) by mouth daily., Disp: 90 tablet, Rfl: 1   traMADol (ULTRAM) 50 MG tablet, Take 1 tablet (50 mg total) by mouth every 6 (six) hours as needed., Disp: 20 tablet, Rfl: 0   allopurinol (ZYLOPRIM) 100 MG tablet, Take 0.5 tablets (50 mg total) by mouth daily., Disp: 30 tablet, Rfl: 1   colchicine 0.6 MG tablet, Take 1 tablet by mouth for gout attack., Disp: 30 tablet, Rfl: 1   Allergies  Allergen Reactions   Meloxicam Other (See Comments)    Fever; muscle aches; "flu-like" symptoms "Allergic," per [facility]   Other Other (See Comments)    "Rose fever and hay fever"     Review of Systems  Constitutional: Negative.   Eyes:  Negative for blurred vision.  Respiratory: Negative.    Cardiovascular: Negative.  Negative for chest pain.  Gastrointestinal: Negative.   Musculoskeletal:  Positive for arthralgias.       She c/o ankle pain. Denies fall/trauma. Thinks it is due to gout.   Neurological: Negative.   Psychiatric/Behavioral: Negative.       Today's Vitals   10/10/21 1439  BP: 126/64  Pulse: (!) 54  Temp: 97.8 F (36.6 C)  TempSrc: Oral  Weight: 204 lb 3.2 oz (92.6 kg)  Height: '5\' 5"'$  (1.651 m)  PainSc: 0-No pain   Body mass index is 33.98 kg/m.   Wt Readings from Last 3 Encounters:  10/10/21 204 lb 3.2 oz (92.6 kg)  10/10/21 204 lb 3.2 oz (92.6 kg)  09/26/21 206 lb 11.2 oz (93.8 kg)    Objective:  Physical Exam Vitals and nursing note reviewed.  Constitutional:      Appearance: Normal appearance.  HENT:     Head: Normocephalic and atraumatic.  Eyes:     Extraocular Movements: Extraocular movements intact.  Cardiovascular:     Rate and Rhythm: Normal rate and regular rhythm.     Heart sounds: Normal heart sounds.  Pulmonary:     Effort: Pulmonary effort is normal.     Breath sounds: Normal breath sounds.  Musculoskeletal:     Cervical back: Normal range of motion.  Skin:    General: Skin  is warm.  Neurological:     General: No focal deficit present.     Mental Status: She is alert.  Psychiatric:        Mood and Affect: Mood normal.        Behavior: Behavior normal.      Assessment And Plan:     1. Type 2 diabetes mellitus with stage 3a chronic kidney disease, with long-term current use of insulin (HCC) Comments: Chronic, I will check labs as below. I will adjust meds as needed. She will c/w Ozempic '1mg'$  weekly and Tresiba 8 units nightly for now. - Amb Referral To Provider Referral Exercise Program (P.R.E.P) - Hemoglobin A1c  2. Hypertensive nephropathy Comments: Chronic, well controlled. She will c/w amlodipine '5mg'$ , losartan '100mg'$  and metoprolol '25mg'$  once daily. - Amb Referral To Provider Referral Exercise Program (P.R.E.P)  3. Primary hypothyroidism Comments: Chronic, I will check thyroid panel and adjust meds as needed.  - TSH + free T4  4. Acute idiopathic gout of right ankle Comments: I will check uric acid level. I will also check updated BMP to determine appropriate dosing of colchicine. Encouraged to avoid known dietary triggers as well. - colchicine 0.6 MG tablet; Take 1 tablet by mouth for gout attack.  Dispense: 30 tablet; Refill: 1 - Uric acid  5. Class 1 obesity due to excess calories with serious comorbidity and body mass index (BMI) of 33.0 to 33.9 in adult Comments: She agrees to PREP referral. Encouraged to strive for BMI<30 to  decrease cardiac risk.    Patient was given opportunity to ask questions. Patient verbalized understanding of the plan and was able to repeat key elements of the plan. All questions were answered to their satisfaction.   I, Maximino Greenland, MD, have reviewed all documentation for this visit. The documentation on 10/10/21 for the exam, diagnosis, procedures, and orders are all accurate and complete.   IF YOU HAVE BEEN REFERRED TO A SPECIALIST, IT MAY TAKE 1-2 WEEKS TO SCHEDULE/PROCESS THE REFERRAL. IF YOU HAVE NOT HEARD FROM US/SPECIALIST IN TWO WEEKS, PLEASE GIVE [place] A CALL AT [phone] X [phone].   THE PATIENT IS ENCOURAGED TO PRACTICE SOCIAL DISTANCING DUE TO THE COVID-19 PANDEMIC.

## 2021-10-10 NOTE — Progress Notes (Signed)
Subjective:   CARIN SHIPP is a 76 y.o. female who presents for Medicare Annual (Subsequent) preventive examination.  Review of Systems     Cardiac Risk Factors include: advanced age (>71mn, >>64women);diabetes mellitus;hypertension;obesity (BMI >30kg/m2)     Objective:    Today's Vitals   10/10/21 1424  BP: 128/62  Pulse: (!) 54  Temp: 97.8 F (36.6 C)  TempSrc: Oral  SpO2: 96%  Weight: 204 lb 3.2 oz (92.6 kg)  Height: '5\' 5"'$  (1.651 m)   Body mass index is 33.98 kg/m.     10/10/2021    2:44 PM 03/28/2021    9:20 AM 11/03/2020    2:00 AM 11/03/2020   12:51 AM 10/29/2020    3:02 PM 10/13/2020   12:00 PM 09/20/2020    2:29 PM  Advanced Directives  Does Patient Have a Medical Advance Directive? Yes Yes  Yes Yes Yes Yes  Type of AParamedicof [place]ak[place] will  [place] will  [place] will Healthcare Power of [place] will  Does patient want to make changes to medical advance directive?   No - Patient declined  No - Patient declined No - Patient declined   Copy of [place] Chart? Yes - validated most recent copy scanned in chart (See row information)    No - copy requested, Physician notified No - copy requested No - copy requested    Current Medications (verified) Outpatient Encounter Medications as of 10/10/2021  Medication Sig   amLODipine (NORVASC) 5 MG tablet Take 1 tablet by mouth once daily   AZO-CRANBERRY PO Take 1 tablet by mouth daily.   B Complex-C (B-COMPLEX WITH VITAMIN C) tablet Take 1 tablet by mouth 3 (three) times a week.   Biotin 10000 MCG TABS Take 10,000 mcg by mouth daily.   cetirizine (ZYRTEC) 10 MG tablet Take 10 mg by mouth at bedtime.   Cholecalciferol 5000 units TABS Take 5,000 Units by mouth daily.   colchicine 0.6 MG tablet Take 1 tablet by mouth for gout attack.   diclofenac Sodium (VOLTAREN) 1 % GEL APPLY 2  GRAMS TO AFFECTED AREA(S) 4 TIMES DAILY AS NEEDED (Patient taking differently: Apply 2-4 g topically every 6 (six) hours as needed (to affected areas).)   gabapentin (NEURONTIN) 300 MG capsule Take 600 mg by mouth at bedtime.   glucose blood (ONETOUCH VERIO) test strip USE TO CHECK BLOOD SUGAR   TWO TIMES A DAY AS         INSTRUCTED   Insulin Degludec (TRESIBA) 100 UNIT/ML SOLN Inject 8 Units/oz/day into the skin. Patient is taking at bedtime   Lancets (ONETOUCH DELICA PLUS [id number] MISC USE TO CHECK BLOOD SUGARS  TWO TIMES A DAY AS         INSTRUCTED   levothyroxine (SYNTHROID) 25 MCG tablet Take 25 mcg by mouth daily before breakfast. Taking 1 tablet every other day, and a 1/2 tablet on other days.   losartan (COZAAR) 100 MG tablet TAKE 1 TABLET DAILY   metoprolol succinate (TOPROL-XL) 25 MG 24 hr tablet TAKE 1 TABLET AT BEDTIME   (DISCONTINUE COREG)   Multiple Vitamins-Minerals (MULTIVITAMIN WITH MINERALS) tablet Take 1 tablet by mouth daily. Women's 50+   naproxen sodium (ALEVE) 220 MG tablet Take 220 mg by mouth at bedtime.   Semaglutide, 1 MG/DOSE, (OZEMPIC, 1 MG/DOSE,) 4 MG/3ML SOPN Inject 1 mg into the skin once a week.  simvastatin (ZOCOR) 10 MG tablet Take 1 tablet (10 mg total) by mouth daily.   Semaglutide, 1 MG/DOSE, (OZEMPIC, 1 MG/DOSE,) 4 MG/3ML SOPN Inject 1 mg into the skin once a week.   traMADol (ULTRAM) 50 MG tablet Take 1 tablet (50 mg total) by mouth every 6 (six) hours as needed.   warfarin (COUMADIN) 5 MG tablet Take 1 tablet daily except 1.5 tablets on Monday, Wednesday, Friday or as directed by Anticoagulation Clinic.   [DISCONTINUED] SEMGLEE, YFGN, 100 UNIT/ML Pen Inject 9 Units into the skin at bedtime.   No facility-administered encounter medications on file as of 10/10/2021.    Allergies (verified) Meloxicam and Other   History: Past Medical History:  Diagnosis Date   Arthritis    Chronic kidney disease    self reports ckd stage 3    Depression     Diabetes mellitus, type II, insulin dependent ([place])    With neurologic complications. 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    Hypertensive nephropathy 07/05/2018   Hypothyroidism    Hypothyroidism 02/06/2018   Morbid obesity with BMI of 50.0-59.9, adult ([place])    Scoliosis    Past Surgical History:  Procedure Laterality Date   ABDOMINAL HYSTERECTOMY     uterus   CHOLECYSTECTOMY     DIAGNOSTIC LAPAROSCOPY     DILATION AND CURETTAGE OF UTERUS     EYE SURGERY     cataract extraction bilateral    INCISION AND DRAINAGE OF WOUND N/A 10/15/2020   Procedure: IRRIGATION AND DEBRIDEMENT WOUND;  Surgeon: Wallace Going, DO;  Location: [facility];  Service: Plastics;  Laterality: N/A;  60 min   JOINT REPLACEMENT     Left knee   KNEE ARTHROPLASTY Right 07/30/2017   Procedure: RIGHT TOTAL KNEE ARTHROPLASTY WITH COMPUTER NAVIGATION;  Surgeon: Rod Can, MD;  Location: [facility];  Service: Orthopedics;  Laterality: Right;  Needs RNFA   LAPAROSCOPIC GASTRIC BANDING     and reversal   LAPAROSCOPIC GASTRIC SLEEVE RESECTION N/A 08/26/2016   Procedure: LAPAROSCOPIC GASTRIC SLEEVE RESECTION WITH UPPER ENOD;  Surgeon: Johnathan Hausen, MD;  Location: [facility];  Service: General;  Laterality: N/A;   PANNICULECTOMY N/A 09/26/2020   Procedure: PANNICULECTOMY;  Surgeon: Wallace Going, DO;  Location: [facility];  Service: Plastics;  Laterality: N/A;   TONSILLECTOMY     TRANSTHORACIC ECHOCARDIOGRAM  11/2013   EF 65-70%. Normal diastolic Fxn.  Normal Valves.   UMBILICAL HERNIA REPAIR N/A 09/26/2020   Procedure: HERNIA REPAIR UMBILICAL ADULT;  Surgeon: Georganna Skeans, MD;  Location: [facility];  Service: General;  Laterality: N/A;   Family History  Problem Relation Age of  Onset   Heart attack Mother    Heart failure Mother    Stroke Father    Social History   Socioeconomic History   Marital status: Single    Spouse name: Not on file   Number of children: Not on file   Years of education: Not on file   Highest education level: Not on file  Occupational History   Occupation: retired  Tobacco Use   Smoking status: Former    Packs/day: 0.25    Years: 10.00    Total pack years: 2.50  Types: Cigarettes   Smokeless tobacco: Never   Tobacco comments:    quit 20 years ago  Vaping Use   Vaping Use: Never used  Substance and Sexual Activity   Alcohol use: Not Currently    Alcohol/week: 1.0 standard drink of alcohol    Types: 1 Glasses of wine per week    Comment: occasional   Drug use: No   Sexual activity: Yes  Other Topics Concern   Not on file  Social History Narrative   Not on file   Social Determinants of Health   Financial Resource Strain: Low Risk  (10/10/2021)   Overall Financial Resource Strain (CARDIA)    Difficulty of Paying Living Expenses: Not hard at all  Food Insecurity: No Food Insecurity (10/10/2021)   Hunger Vital Sign    Worried About Running Out of Food in the Last Year: Never true    Ran Out of Food in the Last Year: Never true  Transportation Needs: No Transportation Needs (10/10/2021)   PRAPARE - Hydrologist (Medical): No    Lack of Transportation (Non-Medical): No  Physical Activity: Inactive (10/10/2021)   Exercise Vital Sign    Days of Exercise per Week: 0 days    Minutes of Exercise per Session: 0 min  Stress: No Stress Concern Present (10/10/2021)   [place]    Feeling of Stress : Not at all  Social Connections: Unknown (07/15/2018)   Social Connection and Isolation Panel [NHANES]    Frequency of Communication with Friends and Family: Three times a week    Frequency of Social Gatherings with Friends and Family:  Never    Attends Religious Services: More than 4 times per year    Active Member of Clubs or Organizations: Yes    Attends Music therapist: More than 4 times per year    Marital Status: Not on file    Tobacco Counseling Counseling given: Not Answered Tobacco comments: quit 20 years ago   Clinical Intake:  Pre-visit preparation completed: Yes  Pain : No/denies pain     Nutritional Status: BMI > 30  Obese Nutritional Risks: Nausea/ vomitting/ diarrhea (little diarrhea due to medicine) Diabetes: Yes  How often do you need to have someone help you when you read instructions, pamphlets, or other written materials from your doctor or pharmacy?: 1 - Never  Diabetic? Yes Nutrition Risk Assessment:  Has the patient had any N/V/D within the last 2 months?  Yes  Does the patient have any non-healing wounds?  No  Has the patient had any unintentional weight loss or weight gain?  No   Diabetes:  Is the patient diabetic?  Yes  If diabetic, was a CBG obtained today?  No  Did the patient bring in their glucometer from home?  No  How often do you monitor your CBG's? daily.   Financial Strains and Diabetes Management:  Are you having any financial strains with the device, your supplies or your medication? No .  Does the patient want to be seen by Chronic Care Management for management of their diabetes?  No  Would the patient like to be referred to a Nutritionist or for Diabetic Management?  No   Diabetic Exams:  Diabetic Eye Exam: Completed 03/08/2021 Diabetic Foot Exam: Completed 02/27/2021   Interpreter Needed?: No  Information entered by :: NAllen LPN   Activities of Daily Living    10/10/2021    2:46  PM 11/03/2020   12:00 AM  In your present state of health, do you have any difficulty performing the following activities:  Hearing? 0 0  Vision? 0 0  Difficulty concentrating or making decisions? 0 0  Walking or climbing stairs? 1 1  Dressing or bathing? 0  1  Doing errands, shopping? 0 1  Preparing Food and eating ? N   Using the Toilet? N   In the past six months, have you accidently leaked urine? Y   Comment sometimes, wears pull ups at night   Do you have problems with loss of bowel control? Y   Comment wears pull ups at night   Managing your Medications? N   Managing your Finances? N   Housekeeping or managing your Housekeeping? N     Patient Care Team: Glendale Chard, MD as PCP - General (Internal Medicine) Elouise Munroe, MD as PCP - Cardiology (Cardiology) Rex Kras Claudette Stapler, RN as Case Manager Mayford Knife, [facility] (Pharmacist)  Indicate any recent Medical Services you may have received from other than [facility] providers in the past year (date may be approximate).     Assessment:   This is a routine wellness examination for Shalie.  Hearing/Vision screen Vision Screening - Comments:: Regular eye exams, [facility]  Dietary issues and exercise activities discussed: Current Exercise Habits: The patient does not participate in regular exercise at present   Goals Addressed             This Visit's Progress    Patient Stated       10/10/2021, wants to lose 20-30 pounds       Depression Screen    10/10/2021    2:46 PM 10/10/2021    2:41 PM 09/20/2020    2:30 PM 03/15/2020   12:27 PM 08/18/2019   12:22 PM 02/08/2019    9:42 AM 08/31/2018   10:25 AM  PHQ 2/9 Scores  PHQ - 2 Score 0 0 0 2 0 0 0  PHQ- 9 Score    7 3 0     Fall Risk    10/10/2021    2:45 PM 10/10/2021    2:40 PM 11/22/2020    8:55 AM 08/18/2019   12:21 PM 02/08/2019    9:41 AM  [place] in the past year? 0 0 0 0 0  Number falls in past yr: 0 0 0    Injury with Fall? 0 0 0    Risk for fall due to : Medication side effect;Impaired mobility;Impaired balance/gait   Medication side effect;Impaired balance/gait Impaired balance/gait;Impaired mobility;Medication side effect  Follow up Falls evaluation completed;Education provided;Falls prevention  discussed Falls evaluation completed  Falls evaluation completed;Education provided;Falls prevention discussed Falls evaluation completed;Education provided;Falls prevention discussed    FALL RISK PREVENTION PERTAINING TO THE HOME:  Any stairs in or around the home? No  If so, are there any without handrails? N/a Home free of loose throw rugs in walkways, pet beds, electrical cords, etc? Yes  Adequate lighting in your home to reduce risk of falls? Yes   ASSISTIVE DEVICES UTILIZED TO PREVENT FALLS:  Life alert? No  Use of a cane, walker or w/c? Yes  Grab bars in the bathroom? Yes  Shower chair or bench in shower? Yes  Elevated toilet seat or a handicapped toilet? Yes   TIMED UP AND GO:  Was the test performed? No .    Gait slow and steady with assistive device  Cognitive Function:  10/10/2021    2:48 PM 09/20/2020    2:31 PM 08/18/2019   12:26 PM 02/08/2019    9:45 AM 02/04/2018   11:36 AM  6CIT Screen  What Year? 0 points 0 points 0 points 0 points 0 points  What month? 0 points 0 points 0 points 0 points 0 points  What time? 3 points 0 points 0 points 0 points 0 points  Count back from 20 0 points 0 points 0 points 0 points 0 points  Months in reverse 0 points 0 points 0 points 0 points 0 points  Repeat phrase 0 points 0 points 0 points 0 points 0 points  Total Score 3 points 0 points 0 points 0 points 0 points    Immunizations Immunization History  Administered Date(s) Administered   Fluad Quad(high Dose 65+) 11/10/2018, 11/29/2019, 11/22/2020   Influenza, High Dose Seasonal PF 12/21/2017, 11/10/2018   Influenza,inj,quad, With Preservative 02/25/2016   Influenza-Unspecified 10/25/2016   Moderna Sars-Covid-2 Vaccination 03/28/2019, 04/25/2019, 01/02/2020, 07/06/2020   PNEUMOCOCCAL CONJUGATE-20 08/03/2020   Pneumococcal Polysaccharide-23 11/02/2013   Pneumococcal-Unspecified 11/24/2016   Zoster Recombinat (Shingrix) 11/03/2018, 12/31/2018    TDAP status:  Up to date  Flu Vaccine status: Due, Education has been provided regarding the importance of this vaccine. Advised may receive this vaccine at local pharmacy or Health Dept. Aware to provide a copy of the vaccination record if obtained from local pharmacy or Health Dept. Verbalized acceptance and understanding.  Pneumococcal vaccine status: Up to date  Covid-19 vaccine status: Completed vaccines  Qualifies for Shingles Vaccine? Yes   Zostavax completed Yes   Shingrix Completed?: Yes  Screening Tests Health Maintenance  Topic Date Due   COVID-19 Vaccine (5 - Moderna risk series) 08/31/2020   HEMOGLOBIN A1C  08/27/2021   INFLUENZA VACCINE  09/24/2021   FOOT EXAM  02/27/2022   OPHTHALMOLOGY EXAM  03/08/2022   TETANUS/TDAP  07/03/2025   Pneumonia Vaccine 31+ Years old  Completed   DEXA SCAN  Completed   Hepatitis C Screening  Completed   Zoster Vaccines- Shingrix  Completed   HPV VACCINES  Aged Out   COLONOSCOPY (Pts 45-19yr Insurance coverage will need to be confirmed)  Discontinued    Health Maintenance  Health Maintenance Due  Topic Date Due   COVID-19 Vaccine (5 - Moderna risk series) 08/31/2020   HEMOGLOBIN A1C  08/27/2021   INFLUENZA VACCINE  09/24/2021    Colorectal cancer screening: No longer required.   Mammogram status: Completed 07/31/2021. Repeat every year  Bone Density status: Completed /03/2018.  Lung Cancer Screening: (Low Dose CT Chest recommended if Age 10158-80years, 30 pack-year currently smoking OR have quit w/in 15years.) does not qualify.   Lung Cancer Screening Referral: no  Additional Screening:  Hepatitis C Screening: does qualify; Completed 05/31/2020  Vision Screening: Recommended annual ophthalmology exams for early detection of glaucoma and other disorders of the eye. Is the patient up to date with their annual eye exam?  Yes  Who is the provider or what is the name of the office in which the patient attends annual eye exams? [facility] is not established with a provider, would they like to be referred to a provider to establish care? No .   Dental Screening: Recommended annual dental exams for proper oral hygiene  Community Resource Referral / Chronic Care Management: CRR required this visit?  No   CCM required this visit?  No      Plan:  I have personally reviewed and noted the following in the patient's chart:   Medical and social history Use of alcohol, tobacco or illicit drugs  Current medications and supplements including opioid prescriptions.  Functional ability and status Nutritional status Physical activity Advanced directives List of other physicians Hospitalizations, surgeries, and ER visits in previous 12 months Vitals Screenings to include cognitive, depression, and falls Referrals and appointments  In addition, I have reviewed and discussed with patient certain preventive protocols, quality metrics, and best practice recommendations. A written personalized care plan for preventive services as well as general preventive health recommendations were provided to patient.     Kellie Simmering, LPN   1/76/1607   Nurse Notes: none

## 2021-10-11 ENCOUNTER — Telehealth: Payer: Self-pay

## 2021-10-11 LAB — TSH+FREE T4
Free T4: 1.5 ng/dL (ref 0.82–1.77)
TSH: 2.88 u[IU]/mL (ref 0.450–4.500)

## 2021-10-11 LAB — URIC ACID: Uric Acid: 8.7 mg/dL — ABNORMAL HIGH (ref 3.1–7.9)

## 2021-10-11 LAB — HEMOGLOBIN A1C
Est. average glucose Bld gHb Est-mCnc: 148 mg/dL
Hgb A1c MFr Bld: 6.8 % — ABNORMAL HIGH (ref 4.8–5.6)

## 2021-10-11 NOTE — Telephone Encounter (Signed)
Call to pt reference PREP referral Explained program. Interested in participating.  Already a member of [facility] M/W start 11/18/21 but already has a commitment for that time.  Can do M/W 230p-345pm which will start 10/30.  Would really prefer afternoons on Tues/Thurs Explained will call her if another T/TH class starts before Oct 30th so she can choose Will call her either way to set up intake interview before class start

## 2021-10-13 ENCOUNTER — Other Ambulatory Visit: Payer: Self-pay | Admitting: Internal Medicine

## 2021-10-13 MED ORDER — ALLOPURINOL 100 MG PO TABS: 50.0000 mg | ORAL_TABLET | Freq: Every day | ORAL | 1 refills | Status: DC

## 2021-10-14 ENCOUNTER — Ambulatory Visit: Payer: Self-pay

## 2021-10-14 NOTE — Progress Notes (Signed)
This encounter was created in error - please disregard.

## 2021-10-14 NOTE — Patient Outreach (Signed)
  Care Coordination   Follow Up Visit Note   10/14/2021 Name: Teresa Golden MRN: [id number] DOB: [birth year]  Teresa Golden is a 76 y.o. year old female who sees Teresa Chard, MD for primary care. I spoke with  Teresa Golden by phone today  What matters to the patients health and wellness today?  Patient would like to build muscle mass and lower her A1c.    Goals Addressed       Patient Stated     I want to lower my A1c to 6.0 (pt-stated)        Care Coordination Interventions: Assessed patient's understanding of A1c goal: <6.5% Provided education to patient about basic DM disease process Advised patient, providing education and rationale, to check cbg daily before breakfast and at bedtime and record, calling PCP for findings outside established parameters Review of patient status, including review of consultants reports, relevant laboratory and other test results, and medications completed Educated patient on daily glycemic control, FBS 80-130, <180 after meals  Educated patient on how to use the Plate Method and portion control Educated on importance to implement daily exercise regimen, patient will start the PREP program in October  Lab Results  Component Value Date   HGBA1C 6.8 (H) 10/10/2021       Other     I want to work on building my muscle mass        Care Coordination Interventions: Evaluation of current treatment plan related to physical health and patient's adherence to plan as established by provider Determined PCP sent PREP referral Determined patient has spoken with the nurse assisting with the PREP program, she will start this program in October Determined patient's goal is to build muscle mass Educated patient on importance of adhering to her HEP and staying well hydrated with water  Assessed social determinant of health barriers   SDOH assessments and interventions completed:  Yes     Care Coordination Interventions Activated:  Yes  Care Coordination  Interventions:  Yes, provided   Follow up plan: Follow up call scheduled for 01/13/22 '@09'$ :00 AM     Encounter Outcome:  Pt. Visit Completed

## 2021-10-14 NOTE — Patient Instructions (Signed)
Visit Information  Thank you for taking time to visit with me today. Please don't hesitate to contact me if I can be of assistance to you.   Following are the goals we discussed today:   Goals Addressed       Patient Stated     I want to lower my A1c to 6.0 (pt-stated)        Care Coordination Interventions: Assessed patient's understanding of A1c goal: <6.5% Provided education to patient about basic DM disease process Advised patient, providing education and rationale, to check cbg daily before breakfast and at bedtime and record, calling PCP for findings outside established parameters Review of patient status, including review of consultants reports, relevant laboratory and other test results, and medications completed Educated patient on daily glycemic control, FBS 80-130, <180 after meals  Educated patient on how to use the Plate Method and portion control Educated on importance to implement daily exercise regimen, patient will start the PREP program in October  Lab Results  Component Value Date   HGBA1C 6.8 (H) 10/10/2021       Other     I want to work on building my muscle mass        Care Coordination Interventions: Evaluation of current treatment plan related to physical health and patient's adherence to plan as established by provider Determined PCP sent PREP referral Determined patient has spoken with the nurse assisting with the PREP program, she will start this program in October Determined patient's goal is to build muscle mass Educated patient on importance of adhering to her HEP and staying well hydrated with water  Assessed social determinant of health barriers    Our next appointment is by telephone on 01/13/22 at 0900 AM   Please call the care guide team at [phone] if you need to cancel or reschedule your appointment.   If you are experiencing a Mental Health or [place] or need someone to talk to, please call [phone]-TALK (toll free, 24  hour hotline)  Patient verbalizes understanding of instructions and care plan provided today and agrees to view in [place]. Active MyChart status and patient understanding of how to access instructions and care plan via MyChart confirmed with patient.     Barb Merino, RN, BSN, CCM Care Management Coordination [facility] Care Management Direct Phone: [phone]

## 2021-10-20 DIAGNOSIS — F5101 Primary insomnia: Secondary | ICD-10-CM | POA: Insufficient documentation

## 2021-10-20 DIAGNOSIS — E6609 Other obesity due to excess calories: Secondary | ICD-10-CM | POA: Insufficient documentation

## 2021-10-20 DIAGNOSIS — M10071 Idiopathic gout, right ankle and foot: Secondary | ICD-10-CM | POA: Insufficient documentation

## 2021-10-31 ENCOUNTER — Encounter: Payer: Self-pay | Admitting: Internal Medicine

## 2021-10-31 ENCOUNTER — Ambulatory Visit: Payer: Self-pay | Admitting: Internal Medicine

## 2021-10-31 ENCOUNTER — Ambulatory Visit (INDEPENDENT_AMBULATORY_CARE_PROVIDER_SITE_OTHER): Payer: Medicare HMO | Admitting: Internal Medicine

## 2021-10-31 VITALS — BP 130/78 | HR 63 | Temp 98.4°F | Ht 65.0 in | Wt 204.0 lb

## 2021-10-31 DIAGNOSIS — R6883 Chills (without fever): Secondary | ICD-10-CM

## 2021-10-31 DIAGNOSIS — R051 Acute cough: Secondary | ICD-10-CM | POA: Diagnosis not present

## 2021-10-31 LAB — POC COVID19 BINAXNOW: SARS Coronavirus 2 Ag: NEGATIVE

## 2021-10-31 NOTE — Progress Notes (Unsigned)
Rich Brave [place]ittleton,acting as a Education administrator for Maximino Greenland, MD.,have documented all relevant documentation on the behalf of Maximino Greenland, MD,as directed by  Maximino Greenland, MD while in the presence of Maximino Greenland, MD.    Subjective:     [name] , female    DOB: Jun 29, 1945 , 76 y.o.   MRN: [id number]   Chief Complaint  Patient presents with   URI    HPI  Patient presents today for further evaluation of cough and chills.  She wants to be tested for COVID.  She reports she didn't feel so well yesterday, but is feeling better today. Yesterday, she developed chills, dry cough, and red watery eyes. She wants to be sure she is okay because she is now volunteering at [employer]. She has not taken any medication. She denies known ill contacts. She admits she has not been masking.   URI  This is a new problem. The current episode started yesterday. There has been no fever. Associated symptoms include coughing. She has tried sleep for the symptoms. The treatment provided mild relief.     Past Medical History:  Diagnosis Date   Arthritis    Chronic kidney disease    self reports ckd stage 3    Depression    Diabetes mellitus, type II, insulin dependent ([place])    With neurologic complications. 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    Hypertensive nephropathy 07/05/2018   Hypothyroidism    Hypothyroidism 02/06/2018   Morbid obesity with BMI of 50.0-59.9, adult ([place])    Scoliosis      Family History  Problem Relation Age of Onset   Heart attack Mother    Heart failure Mother    Stroke Father      Current Outpatient Medications:    allopurinol (ZYLOPRIM) 100 MG tablet, Take 0.5 tablets (50 mg total) by  mouth daily., Disp: 30 tablet, Rfl: 1   amLODipine (NORVASC) 5 MG tablet, Take 1 tablet by mouth once daily, Disp: 90 tablet, Rfl: 1   AZO-CRANBERRY PO, Take 1 tablet by mouth daily., Disp: , Rfl:    B Complex-C (B-COMPLEX WITH VITAMIN C) tablet, Take 1 tablet by mouth 3 (three) times a week., Disp: , Rfl:    Biotin 10000 MCG TABS, Take 10,000 mcg by mouth daily., Disp: , Rfl:    cetirizine (ZYRTEC) 10 MG tablet, Take 10 mg by mouth at bedtime., Disp: , Rfl:    Cholecalciferol 5000 units TABS, Take 5,000 Units by mouth daily., Disp: , Rfl:    colchicine 0.6 MG tablet, Take 1 tablet by mouth for gout attack., Disp: 30 tablet, Rfl: 1   diclofenac Sodium (VOLTAREN) 1 % GEL, APPLY 2 GRAMS TO AFFECTED AREA(S) 4 TIMES DAILY AS NEEDED (Patient taking differently: Apply 2-4 g topically every 6 (six) hours as needed (to affected areas).), Disp: 200 g, Rfl: 2   gabapentin (NEURONTIN) 300 MG capsule, Take 600 mg by mouth at bedtime., Disp: , Rfl:    glucose blood (ONETOUCH VERIO) test strip, USE TO CHECK BLOOD SUGAR   TWO TIMES A DAY AS         INSTRUCTED, Disp: 200 strip, Rfl: 3  Insulin Degludec (TRESIBA) 100 UNIT/ML SOLN, Inject 8 Units/oz/day into the skin. Patient is taking at bedtime, Disp: , Rfl:    Lancets (ONETOUCH DELICA PLUS [id number]) MISC, USE TO CHECK BLOOD SUGARS  TWO TIMES A DAY AS         INSTRUCTED, Disp: 200 each, Rfl: 3   losartan (COZAAR) 100 MG tablet, TAKE 1 TABLET DAILY, Disp: 90 tablet, Rfl: 2   metoprolol succinate (TOPROL-XL) 25 MG 24 hr tablet, TAKE 1 TABLET AT BEDTIME   (DISCONTINUE COREG), Disp: 90 tablet, Rfl: 1   Multiple Vitamins-Minerals (MULTIVITAMIN WITH MINERALS) tablet, Take 1 tablet by mouth daily. Women's 50+, Disp: , Rfl:    naproxen sodium (ALEVE) 220 MG tablet, Take 220 mg by mouth at bedtime., Disp: , Rfl:    Semaglutide, 1 MG/DOSE, (OZEMPIC, 1 MG/DOSE,) 4 MG/3ML SOPN, Inject 1 mg into the skin once a week., Disp: 9 mL, Rfl: 1   simvastatin (ZOCOR) 10 MG tablet,  Take 1 tablet (10 mg total) by mouth daily., Disp: 90 tablet, Rfl: 1   traMADol (ULTRAM) 50 MG tablet, Take 1 tablet (50 mg total) by mouth every 6 (six) hours as needed., Disp: 20 tablet, Rfl: 0   levothyroxine (SYNTHROID) 25 MCG tablet, Take 25 mcg by mouth daily before breakfast. Taking 1 tablet every other day, and a 1/2 tablet on other days. (Patient not taking: Reported on 10/14/2021), Disp: , Rfl:    Allergies  Allergen Reactions   Meloxicam Other (See Comments)    Fever; muscle aches; "flu-like" symptoms "Allergic," per [facility]   Other Other (See Comments)    "Rose fever and hay fever"     Review of Systems  Constitutional:  Positive for chills and fatigue.  Respiratory:  Positive for cough.   Cardiovascular: Negative.   Gastrointestinal: Negative.   Neurological: Negative.   Psychiatric/Behavioral: Negative.       Today's Vitals   10/31/21 1416  BP: 130/78  Pulse: 63  Temp: 98.4 F (36.9 C)  Weight: 204 lb (92.5 kg)  Height: '5\' 5"'$  (1.651 m)  PainSc: 0-No pain   Body mass index is 33.95 kg/m.  Wt Readings from Last 3 Encounters:  10/31/21 204 lb (92.5 kg)  10/10/21 204 lb 3.2 oz (92.6 kg)  10/10/21 204 lb 3.2 oz (92.6 kg)     Objective:  Physical Exam Vitals and nursing note reviewed.  Constitutional:      Appearance: Normal appearance.  HENT:     Head: Normocephalic and atraumatic.     Right Ear: Tympanic membrane, ear canal and external ear normal. There is no impacted cerumen.     Left Ear: Tympanic membrane, ear canal and external ear normal. There is no impacted cerumen.  Eyes:     Extraocular Movements: Extraocular movements intact.  Cardiovascular:     Rate and Rhythm: Normal rate and regular rhythm.     Heart sounds: Normal heart sounds.  Pulmonary:     Effort: Pulmonary effort is normal.     Breath sounds: Normal breath sounds.  Musculoskeletal:     Cervical back: Normal range of motion.  Skin:    General: Skin is warm.  Neurological:      General: No focal deficit present.     Mental Status: She is alert.  Psychiatric:        Mood and Affect: Mood normal.        Behavior: Behavior normal.      Assessment And Plan:     1. Acute  cough Comments: Rapid COVID test is negative. I will send PCR. She does want treatment if positive. Advised to stay well hydrated, take vitamin C, D and zinc. She has been instructed to go to ER should her sx persist/worsen.  - POC COVID-19 - Novel Coronavirus, NAA ([facility])   Patient was given opportunity to ask questions. Patient verbalized understanding of the plan and was able to repeat key elements of the plan. All questions were answered to their satisfaction.   I, Maximino Greenland, MD, have reviewed all documentation for this visit. The documentation on 11/05/21 for the exam, diagnosis, procedures, and orders are all accurate and complete.   IF YOU HAVE BEEN REFERRED TO A SPECIALIST, IT MAY TAKE 1-2 WEEKS TO SCHEDULE/PROCESS THE REFERRAL. IF YOU HAVE NOT HEARD FROM US/SPECIALIST IN TWO WEEKS, PLEASE GIVE [place] A CALL AT [phone] X [phone].   THE PATIENT IS ENCOURAGED TO PRACTICE SOCIAL DISTANCING DUE TO THE COVID-19 PANDEMIC.

## 2021-11-01 LAB — NOVEL CORONAVIRUS, NAA: SARS-CoV-2, NAA: NOT DETECTED

## 2021-11-05 ENCOUNTER — Encounter: Payer: Self-pay | Admitting: Internal Medicine

## 2021-11-05 ENCOUNTER — Telehealth: Payer: Self-pay

## 2021-11-05 NOTE — Chronic Care Management (AMB) (Signed)
Novo Nordisk patient assistance program notification:  120- day supply of Ozempic 1 mg will be filled on 10/25/2021 and should arrive to the office in 10-14 business days. Patient has 0  refill remaining and enrollment will expire on 01/23/2022.  The next refill for patient will be fulfilled on 01/11/2022, no further action required, patient will be due for re-enrollment.  [name], [place] Pharmacist Assistant [phone]

## 2021-11-14 ENCOUNTER — Telehealth: Payer: Self-pay

## 2021-11-14 ENCOUNTER — Other Ambulatory Visit: Payer: Medicare HMO

## 2021-11-14 DIAGNOSIS — M10071 Idiopathic gout, right ankle and foot: Secondary | ICD-10-CM

## 2021-11-14 NOTE — Chronic Care Management (AMB) (Signed)
11-14-2021: Called to inform patient that ozempic is ready for pickup. Patient stated she just left office for labs and Ozmempic was given to her.  [place] Pharmacist Assistant [phone]'

## 2021-11-15 LAB — URIC ACID: Uric Acid: 6 mg/dL (ref 3.1–7.9)

## 2021-11-26 ENCOUNTER — Telehealth: Payer: Self-pay

## 2021-11-26 NOTE — Chronic Care Management (AMB) (Signed)
Novo Nordisk patient assistance program notification:  120- day supply of Novofine needles will be filled on 12/06/2021 and should arrive to the office in 10-14 business days.    [name], [place] Pharmacist Assistant [phone]

## 2021-11-29 ENCOUNTER — Telehealth: Payer: Self-pay

## 2021-11-29 NOTE — Telephone Encounter (Signed)
Call to pt Offered 10/17 class prefers T/TH Can do class may miss 1st class due to dermatology appt.  Intake scheduled 10/12 1pm at [facility] for transport.

## 2021-12-03 ENCOUNTER — Encounter: Payer: Self-pay | Admitting: Internal Medicine

## 2021-12-03 ENCOUNTER — Ambulatory Visit (INDEPENDENT_AMBULATORY_CARE_PROVIDER_SITE_OTHER): Payer: Medicare HMO | Admitting: Internal Medicine

## 2021-12-03 VITALS — BP 142/78 | HR 91 | Temp 99.3°F | Ht 65.0 in | Wt 193.0 lb

## 2021-12-03 DIAGNOSIS — I129 Hypertensive chronic kidney disease with stage 1 through stage 4 chronic kidney disease, or unspecified chronic kidney disease: Secondary | ICD-10-CM

## 2021-12-03 DIAGNOSIS — N3000 Acute cystitis without hematuria: Secondary | ICD-10-CM | POA: Diagnosis not present

## 2021-12-03 DIAGNOSIS — N1831 Chronic kidney disease, stage 3a: Secondary | ICD-10-CM | POA: Diagnosis not present

## 2021-12-03 LAB — POCT URINALYSIS DIPSTICK
Glucose, UA: NEGATIVE
Nitrite, UA: NEGATIVE
Protein, UA: POSITIVE — AB
Spec Grav, UA: 1.02 (ref 1.010–1.025)
Urobilinogen, UA: 0.2 E.U./dL
pH, UA: 5.5 (ref 5.0–8.0)

## 2021-12-03 MED ORDER — CEFTRIAXONE SODIUM 500 MG IJ SOLR
500.0000 mg | Freq: Once | INTRAMUSCULAR | Status: AC
  Administered 2021-12-03: 500 mg via INTRAMUSCULAR

## 2021-12-03 MED ORDER — CEPHALEXIN 500 MG PO CAPS: 500.0000 mg | ORAL_CAPSULE | Freq: Three times a day (TID) | ORAL | 0 refills | Status: DC

## 2021-12-03 NOTE — Progress Notes (Signed)
Subjective:     [name] , female    DOB: 1945/07/31 , 76 y.o.   MRN: [id number]   Chief Complaint  Patient presents with   Dysuria    HPI  Patient presents today for UTI symptoms.  She states her sx have worsened over the past two days. She c/o dysuria, fever, weakness and pelvic pressure. She denies having a vaginal discharge.   Dysuria  This is a new problem. The current episode started in the past 7 days. The problem occurs every urination. The problem has been gradually worsening. The pain is moderate. The maximum temperature recorded prior to her arrival was 100 - 100.9 F. Associated symptoms include flank pain, frequency, hesitancy and urgency. Pertinent negatives include no discharge or hematuria. Treatments tried: AZO. The treatment provided mild relief.     Past Medical History:  Diagnosis Date   Arthritis    Chronic kidney disease    self reports ckd stage 3    Depression    Diabetes mellitus, type II, insulin dependent ([place])    With neurologic complications. 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    Hypertensive nephropathy 07/05/2018   Hypothyroidism    Hypothyroidism 02/06/2018   Morbid obesity with BMI of 50.0-59.9, adult ([place])    Scoliosis      Family History  Problem Relation Age of Onset   Heart attack Mother    Heart failure Mother    Stroke Father      Current Outpatient Medications:    allopurinol (ZYLOPRIM) 100 MG tablet, Take 0.5 tablets (50 mg total) by mouth daily., Disp: 30 tablet, Rfl: 1   amLODipine (NORVASC) 5 MG tablet, Take 1 tablet by mouth once daily, Disp: 90 tablet, Rfl: 1   AZO-CRANBERRY PO, Take 1 tablet by mouth daily., Disp: , Rfl:    B Complex-C (B-COMPLEX WITH VITAMIN  C) tablet, Take 1 tablet by mouth 3 (three) times a week., Disp: , Rfl:    Biotin 10000 MCG TABS, Take 10,000 mcg by mouth daily., Disp: , Rfl:    cephALEXin (KEFLEX) 500 MG capsule, Take 1 capsule (500 mg total) by mouth 3 (three) times daily for 10 days., Disp: 21 capsule, Rfl: 0   cetirizine (ZYRTEC) 10 MG tablet, Take 10 mg by mouth at bedtime., Disp: , Rfl:    Cholecalciferol 5000 units TABS, Take 5,000 Units by mouth daily., Disp: , Rfl:    colchicine 0.6 MG tablet, Take 1 tablet by mouth for gout attack., Disp: 30 tablet, Rfl: 1   diclofenac Sodium (VOLTAREN) 1 % GEL, APPLY 2 GRAMS TO AFFECTED AREA(S) 4 TIMES DAILY AS NEEDED (Patient taking differently: Apply 2-4 g topically every 6 (six) hours as needed (to affected areas).), Disp: 200 g, Rfl: 2   gabapentin (NEURONTIN) 300 MG capsule, Take 600 mg by mouth at bedtime., Disp: , Rfl:    glucose blood (ONETOUCH VERIO) test strip, USE TO CHECK BLOOD SUGAR   TWO TIMES A DAY AS         INSTRUCTED, Disp: 200 strip, Rfl: 3   Insulin Degludec (TRESIBA) 100 UNIT/ML SOLN, Inject 8 Units/oz/day into the skin. Patient is taking at bedtime, Disp: , Rfl:  Lancets (ONETOUCH DELICA PLUS [id number]) MISC, USE TO CHECK BLOOD SUGARS  TWO TIMES A DAY AS         INSTRUCTED, Disp: 200 each, Rfl: 3   levothyroxine (SYNTHROID) 25 MCG tablet, Take 25 mcg by mouth daily before breakfast. Taking 1 tablet every other day, and a 1/2 tablet on other days., Disp: , Rfl:    losartan (COZAAR) 100 MG tablet, TAKE 1 TABLET DAILY, Disp: 90 tablet, Rfl: 2   metoprolol succinate (TOPROL-XL) 25 MG 24 hr tablet, TAKE 1 TABLET AT BEDTIME   (DISCONTINUE COREG), Disp: 90 tablet, Rfl: 1   Multiple Vitamins-Minerals (MULTIVITAMIN WITH MINERALS) tablet, Take 1 tablet by mouth daily. Women's 50+, Disp: , Rfl:    naproxen sodium (ALEVE) 220 MG tablet, Take 220 mg by mouth at bedtime., Disp: , Rfl:    Semaglutide, 1 MG/DOSE, (OZEMPIC, 1 MG/DOSE,) 4 MG/3ML SOPN, Inject 1 mg into the skin  once a week., Disp: 9 mL, Rfl: 1   simvastatin (ZOCOR) 10 MG tablet, Take 1 tablet (10 mg total) by mouth daily., Disp: 90 tablet, Rfl: 1   traMADol (ULTRAM) 50 MG tablet, Take 1 tablet (50 mg total) by mouth every 6 (six) hours as needed., Disp: 20 tablet, Rfl: 0   Allergies  Allergen Reactions   Meloxicam Other (See Comments)    Fever; muscle aches; "flu-like" symptoms "Allergic," per [facility]   Other Other (See Comments)    "Rose fever and hay fever"     Review of Systems  Constitutional: Negative.   HENT: Negative.    Eyes: Negative.   Respiratory: Negative.    Cardiovascular: Negative.   Gastrointestinal: Negative.   Genitourinary:  Positive for dysuria, flank pain, frequency, hesitancy and urgency. Negative for hematuria.     Today's Vitals   12/03/21 0854 12/03/21 0922  BP: (!) 142/80 (!) 142/78  Pulse: 91   Temp: 99.3 F (37.4 C)   Weight: 193 lb (87.5 kg)   Height: '5\' 5"'$  (1.651 m)   PainSc: 5     Body mass index is 32.12 kg/m.  Wt Readings from Last 3 Encounters:  12/03/21 193 lb (87.5 kg)  10/31/21 204 lb (92.5 kg)  10/10/21 204 lb 3.2 oz (92.6 kg)     Objective:  Physical Exam Vitals and nursing note reviewed.  Constitutional:      Appearance: Normal appearance.  HENT:     Head: Normocephalic and atraumatic.     Nose:     Comments: Masked     Mouth/Throat:     Comments: Masked  Eyes:     Extraocular Movements: Extraocular movements intact.  Cardiovascular:     Rate and Rhythm: Normal rate and regular rhythm.     Heart sounds: Normal heart sounds.  Pulmonary:     Effort: Pulmonary effort is normal.     Breath sounds: Normal breath sounds.  Abdominal:     Comments: Pos suprapubic tenderness  Musculoskeletal:     Cervical back: Normal range of motion.     Comments: Ambulatory with walker  Skin:    General: Skin is warm.  Neurological:     General: No focal deficit present.     Mental Status: She is alert.  Psychiatric:        Mood and Affect:  Mood normal.        Behavior: Behavior normal.      Assessment And Plan:     1. Acute cystitis without hematuria Comments: I will send off urine culture.  She was given Rocephin, '500mg'$  IM x 1, will send rx Keflex '500mg'$  po tid x 7 days. She is encouraged to take full abx course.  - POCT Urinalysis Dipstick (81002) - Urine Culture - cefTRIAXone (ROCEPHIN) injection 500 mg  2. Hypertensive nephropathy Comments: Chronic, uncontrolled. Her BP is likely exacerbated by her discomfort. No med changes today.   3. Stage 3a chronic kidney disease ([place])   Patient was given opportunity to ask questions. Patient verbalized understanding of the plan and was able to repeat key elements of the plan. All questions were answered to their satisfaction.   I, Maximino Greenland, MD, have reviewed all documentation for this visit. The documentation on 12/03/21 for the exam, diagnosis, procedures, and orders are all accurate and complete.   IF YOU HAVE BEEN REFERRED TO A SPECIALIST, IT MAY TAKE 1-2 WEEKS TO SCHEDULE/PROCESS THE REFERRAL. IF YOU HAVE NOT HEARD FROM US/SPECIALIST IN TWO WEEKS, PLEASE GIVE [place] A CALL AT [phone].   THE PATIENT IS ENCOURAGED TO PRACTICE SOCIAL DISTANCING DUE TO THE COVID-19 PANDEMIC.

## 2021-12-03 NOTE — Patient Instructions (Signed)
Urinary Tract Infection, Adult A urinary tract infection (UTI) is an infection of any part of the urinary tract. The urinary tract includes: The kidneys. The ureters. The bladder. The urethra. These organs make, store, and get rid of pee (urine) in the body. What are the causes? This infection is caused by germs (bacteria) in your genital area. These germs grow and cause swelling (inflammation) of your urinary tract. What increases the risk? The following factors may make you more likely to develop this condition: Using a small, thin tube (catheter) to drain pee. Not being able to control when you pee or poop (incontinence). Being female. If you are female, these things can increase the risk: Using these methods to prevent pregnancy: A medicine that kills sperm (spermicide). A device that blocks sperm (diaphragm). Having low levels of a female hormone (estrogen). Being pregnant. You are more likely to develop this condition if: You have genes that add to your risk. You are sexually active. You take antibiotic medicines. You have trouble peeing because of: A prostate that is bigger than normal, if you are female. A blockage in the part of your body that drains pee from the bladder. A kidney stone. A nerve condition that affects your bladder. Not getting enough to drink. Not peeing often enough. You have other conditions, such as: Diabetes. A weak disease-fighting system (immune system). Sickle cell disease. Gout. Injury of the spine. What are the signs or symptoms? Symptoms of this condition include: Needing to pee right away. Peeing small amounts often. Pain or burning when peeing. Blood in the pee. Pee that smells bad or not like normal. Trouble peeing. Pee that is cloudy. Fluid coming from the vagina, if you are female. Pain in the belly or lower back. Other symptoms include: Vomiting. Not feeling hungry. Feeling mixed up (confused). This may be the first symptom in  older adults. Being tired and grouchy (irritable). A fever. Watery poop (diarrhea). How is this treated? Taking antibiotic medicine. Taking other medicines. Drinking enough water. In some cases, you may need to see a specialist. Follow these instructions at home:  Medicines Take over-the-counter and prescription medicines only as told by your doctor. If you were prescribed an antibiotic medicine, take it as told by your doctor. Do not stop taking it even if you start to feel better. General instructions Make sure you: Pee until your bladder is empty. Do not hold pee for a long time. Empty your bladder after sex. Wipe from front to back after peeing or pooping if you are a female. Use each tissue one time when you wipe. Drink enough fluid to keep your pee pale yellow. Keep all follow-up visits. Contact a doctor if: You do not get better after 1-2 days. Your symptoms go away and then come back. Get help right away if: You have very bad back pain. You have very bad pain in your lower belly. You have a fever. You have chills. You feeling like you will vomit or you vomit. Summary A urinary tract infection (UTI) is an infection of any part of the urinary tract. This condition is caused by germs in your genital area. There are many risk factors for a UTI. Treatment includes antibiotic medicines. Drink enough fluid to keep your pee pale yellow. This information is not intended to replace advice given to you by your health care provider. Make sure you discuss any questions you have with your health care provider. Document Revised: 09/23/2019 Document Reviewed: 09/23/2019 Elsevier Patient Education    2023 [employer]vier Inc.  

## 2021-12-04 ENCOUNTER — Other Ambulatory Visit: Payer: Self-pay

## 2021-12-04 MED ORDER — CEPHALEXIN 500 MG PO CAPS: 500.0000 mg | ORAL_CAPSULE | Freq: Three times a day (TID) | ORAL | 0 refills | Status: AC

## 2021-12-05 ENCOUNTER — Ambulatory Visit: Payer: Self-pay

## 2021-12-05 LAB — URINE CULTURE

## 2021-12-05 NOTE — Patient Instructions (Signed)
Visit Information  Thank you for taking time to visit with me today. Please don't hesitate to contact me if I can be of assistance to you.   Following are the goals we discussed today:   Goals Addressed               This Visit's Progress     Patient Stated     I have a UTI (pt-stated)        Care Coordination Interventions: Evaluation of current treatment plan related to impaired urinary elimination  and patient's adherence to plan as established by provider Reviewed and discussed recent urine results and PCP recommendations for treatment of UTI with Cephalexin 500 mg tid x 7 days Educated patient on the importance to stay well hydrated with water aiming for 48 - 64 oz daily, balance activity with rest and complete full course of antibiotics Instructed patient to notify her PCP is symptoms do not resolve          Our next appointment is by telephone on 01/02/22 at 0900 AM  Please call the care guide team at [phone] if you need to cancel or reschedule your appointment.   If you are experiencing a Mental Health or [place] or need someone to talk to, please call 1-800-273-TALK (toll free, 24 hour hotline) go to [facility] [place] [phone])  The patient verbalized understanding of instructions, educational materials, and care plan provided today and agreed to receive a mailed copy of patient instructions, educational materials, and care plan.   Barb Merino, RN, BSN, CCM Care Management Coordinator [place] [facility]  Direct Phone: [phone]

## 2021-12-05 NOTE — Patient Outreach (Signed)
  Care Coordination   Follow Up Visit Note   12/05/2021 Name: Teresa Golden MRN: [id number] DOB: 05-Jul-1945  Teresa Golden is a 76 y.o. year old female who sees Glendale Chard, MD for primary care. I spoke with  [name] by phone today.  What matters to the patients health and wellness today?  Patient is having symptoms of UTI, she is being treated with an antibiotic.     Goals Addressed               This Visit's Progress     Patient Stated     I have a UTI (pt-stated)        Care Coordination Interventions: Evaluation of current treatment plan related to impaired urinary elimination  and patient's adherence to plan as established by provider Reviewed and discussed recent urine results and PCP recommendations for treatment of UTI with Cephalexin 500 mg tid x 7 days Educated patient on the importance to stay well hydrated with water aiming for 48 - 64 oz daily, balance activity with rest and complete full course of antibiotics Instructed patient to notify her PCP is symptoms do not resolve          SDOH assessments and interventions completed:  No     Care Coordination Interventions Activated:  Yes  Care Coordination Interventions:  Yes, provided   Follow up plan: Follow up call scheduled for 01/13/22 '@0900'$  AM    Encounter Outcome:  Pt. Visit Completed

## 2021-12-06 ENCOUNTER — Telehealth: Payer: Self-pay

## 2021-12-06 NOTE — Chronic Care Management (AMB) (Signed)
Called patient to reschedule 12/10/2021 visit with [name], Pharm D due to provider being unavailable. Spoke with patient she is aware and appointment was rescheduled for 12/11/2021 at 8:30 am.  [name], [place] Pharmacist Assistant [phone]

## 2021-12-09 ENCOUNTER — Telehealth: Payer: Self-pay

## 2021-12-09 NOTE — Progress Notes (Signed)
[facility] PRE[facility] Evaluation  Patient Details  Name: Teresa Golden MRN: [id number] Date of Birth: 05-15-1945 Age: 76 y.o. PCP: Glendale Chard, MD  Vitals:   12/09/21 1607  BP: 132/80  Pulse: 75  Weight: 197 lb 6.4 oz (89.5 kg)     [facility] Eval - 12/09/21 1600       [facility] "PREP" Location   [facility]" Location [facility]      Referral    Referring Provider [place]    Reason for referral Diabetes;Hypertension;Inactivity    Program Start Date 12/10/21   T/TH 1p-215pm x 12wks     Measurement   Waist Circumference 48 inches    Hip Circumference 48.5 inches    Body fat 45.9 percent      Information for Trainer   Goals Goal wt 170, tone up    Current Exercise self workout in pool for 20+ min    Orthopedic Concerns Knees, ankle, shoulder, back pain    Pertinent Medical History DM2, CKD3, HTN    Current Barriers none    Restrictions/Precautions Diabetic snack before exercise;Assistive device    Medications that affect exercise Medication causing dizziness/drowsiness      Timed Up and Go (TUGS)   Timed Up and Go Moderate risk 10-12 seconds      Mobility and Daily Activities   I find it easy to walk up or down two or more flights of stairs. 1    I have no trouble taking out the trash. 4    I do housework such as vacuuming and dusting on my own without difficulty. 3    I can easily lift a gallon of milk (8lbs). 4    I can easily walk a mile. 1    I have no trouble reaching into high cupboards or reaching down to pick up something from the floor. 4    I do not have trouble doing out-door work such as Armed forces logistics/support/administrative officer, raking leaves, or gardening. 1      Mobility and Daily Activities   I feel younger than my age. 3    I feel independent. 4    I feel energetic. 3    I live an active life.  4    I feel strong. 3    I feel healthy. 3    I feel active as other people my age. 4      How fit and strong are you.   Fit and Strong Total Score 42            Past Medical History:   Diagnosis Date   Arthritis    Chronic kidney disease    self reports ckd stage 3    Depression    Diabetes mellitus, type II, insulin dependent ([place])    With neurologic complications. 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    Hypertensive nephropathy 07/05/2018   Hypothyroidism    Hypothyroidism 02/06/2018   Morbid obesity with BMI of 50.0-59.9, adult ([place])    Scoliosis    Past Surgical History:  Procedure Laterality Date   ABDOMINAL HYSTERECTOMY     uterus   CHOLECYSTECTOMY     DIAGNOSTIC LAPAROSCOPY     DILATION AND CURETTAGE  OF UTERUS     EYE SURGERY     cataract extraction bilateral    INCISION AND DRAINAGE OF WOUND N/A 10/15/2020   Procedure: IRRIGATION AND DEBRIDEMENT WOUND;  Surgeon: Wallace Going, DO;  Location: [facility];  Service: Plastics;  Laterality: N/A;  60 min   JOINT REPLACEMENT     Left knee   KNEE ARTHROPLASTY Right 07/30/2017   Procedure: RIGHT TOTAL KNEE ARTHROPLASTY WITH COMPUTER NAVIGATION;  Surgeon: Rod Can, MD;  [facility];  Service: Orthopedics;  Laterality: Right;  Needs RNFA   LAPAROSCOPIC GASTRIC BANDING     and reversal   LAPAROSCOPIC GASTRIC SLEEVE RESECTION N/A 08/26/2016   Procedure: LAPAROSCOPIC GASTRIC SLEEVE RESECTION WITH UPPER ENOD;  Surgeon: Johnathan Hausen, MD;  Location[facility];  Service: General;  Laterality: N/A;   PANNICULECTOMY N/A 09/26/2020   Procedure: PANNICULECTOMY;  Surgeon: Wallace Going, DO;  Location: [facility];  Service: Plastics;  Laterality: N/A;   TONSILLECTOMY     TRANSTHORACIC ECHOCARDIOGRAM  11/2013   EF 65-70%. Normal diastolic Fxn.  Normal Valves.   UMBILICAL HERNIA REPAIR N/A 09/26/2020   Procedure: HERNIA REPAIR UMBILICAL ADULT;  Surgeon: Georganna Skeans,  MD;  Location: [place];  Service: General;  Laterality: N/A;   Social History   Tobacco Use  Smoking Status Former   Packs/day: 0.25   Years: 10.00   Total pack years: 2.50   Types: Cigarettes  Smokeless Tobacco Never  Tobacco Comments   quit 20 years ago    Barnett Hatter 12/09/2021, 4:10 PM

## 2021-12-09 NOTE — Chronic Care Management (AMB) (Signed)
    Teresa Golden was reminded to have all medications, supplements and any blood glucose and blood pressure readings available for review with [name], Pharm. D, at her telephone visit on 12-11-2021 at 8:30.   Questions: Have you had any recent office visit or specialist visit outside of [place]? Patient stated no  Are there any concerns you would like to discuss during your office visit? Patient stated no  Are you having any problems obtaining your medications? (Whether it pharmacy issues or cost) Patient stated no  If patient has any PAP medications ask if they are having any problems getting their PAP medication or refill? Patient stated no  Care Gaps: Covid booster overdue Flu vaccine overdue  Star Rating Drug: Ozempic 1 mg- Patient assistance Simvastatin 10 mg- Last filled 09-28-2021 90 DS [facility] Losartan 100 mg- Last filled 07-30-2021 [facility] (Patient stated she recently received refill)   Any gaps in medications fill history? No   [place] Pharmacist Assistant [phone]

## 2021-12-10 ENCOUNTER — Telehealth: Payer: Medicare HMO

## 2021-12-11 ENCOUNTER — Other Ambulatory Visit: Payer: Self-pay | Admitting: Internal Medicine

## 2021-12-11 ENCOUNTER — Ambulatory Visit (INDEPENDENT_AMBULATORY_CARE_PROVIDER_SITE_OTHER): Payer: Medicare HMO

## 2021-12-11 DIAGNOSIS — I129 Hypertensive chronic kidney disease with stage 1 through stage 4 chronic kidney disease, or unspecified chronic kidney disease: Secondary | ICD-10-CM

## 2021-12-11 DIAGNOSIS — N1831 Chronic kidney disease, stage 3a: Secondary | ICD-10-CM

## 2021-12-11 NOTE — Progress Notes (Signed)
Chronic Care Management Pharmacy Note  12/11/2021 Name:  Teresa Golden MRN:  [id number] DOB:  Jan 14, 1946  Summary: Patient reports that she is doing well. She is very excited about the PREP program that she is enrolled in.   Recommendations/Changes made from today's visit: Recommended patient check the expiration date on her patient assistance medication.  Monitor BP daily and write down readings  Recommend lipid panel is completed during patients next visit to determine next steps with simvastatin.   Plan: Explained to Ms. Klaas where to find the expiration date on her medication bottles. She is going to continue checking her BP at home and will let Dr. Baird Cancer know if her BP is more elevated  She is going to get her flu shot next week Tuesday  She is also going to make sure that gets the COVID Booster in thirty days after her flu shot.  Patient is going to fill out patient assistance application renewal forms for 2024 once she receives it in the mail.  If patients LDL <70 continue current simvastatin 10 mg tablet daily, if not, patient can only be increased to simvastatin 20 mg tablet due to taking Amlodipine.    Subjective: Teresa Golden is an 76 y.o. year old female who is a primary patient of Glendale Chard, MD.  The CCM team was consulted for assistance with disease management and care coordination needs.    Engaged with patient by telephone for follow up visit in response to provider referral for pharmacy case management and/or care coordination services.   Consent to Services:  The patient was given information about Chronic Care Management services, agreed to services, and gave verbal consent prior to initiation of services.  Please see initial visit note for detailed documentation.   Patient Care Team: Glendale Chard, MD as PCP - General (Internal Medicine) Elouise Munroe, MD as PCP - Cardiology (Cardiology) Rex Kras Claudette Stapler, RN as Case Manager Mayford Knife, [facility]  (Pharmacist)  Recent office visits: 12/03/2021 PCP OV 10/31/2021 Acute cough OV   Recent consult visits: 11/13/2021 General surgery  09/26/2021 [facility] visits: None in previous 6 months   Objective:  Lab Results  Component Value Date   CREATININE 1.01 (H) 09/26/2021   BUN 17 09/26/2021   EGFR 52 (L) 02/27/2021   GFRNONAA 57 (L) 09/26/2021   GFRAA 67 11/29/2019   NA 139 09/26/2021   K 3.7 09/26/2021   CALCIUM 9.0 09/26/2021   CO2 27 09/26/2021   GLUCOSE 167 (H) 09/26/2021    Lab Results  Component Value Date/Time   HGBA1C 6.8 (H) 10/10/2021 03:37 PM   HGBA1C 6.5 (H) 02/27/2021 10:30 AM   MICROALBUR 30 02/27/2021 10:48 AM   MICROALBUR 10 02/09/2020 10:59 AM    Last diabetic Eye exam:  Lab Results  Component Value Date/Time   HMDIABEYEEXA No Retinopathy 03/08/2021 12:00 AM    Last diabetic Foot exam: No results found for: "HMDIABFOOTEX"   Lab Results  Component Value Date   CHOL 158 02/27/2021   HDL 73 02/27/2021   LDLCALC 74 02/27/2021   TRIG 50 02/27/2021   CHOLHDL 2.2 02/27/2021       Latest Ref Rng & Units 09/26/2021    1:56 PM 05/02/2021    3:08 PM 03/28/2021    9:57 AM  Hepatic Function  Total Protein 6.5 - 8.1 g/dL 7.0  7.1  7.7   Albumin 3.5 - 5.0 g/dL 3.8   4.1   AST 15 -  41 U/L 21   25   ALT 0 - 44 U/L 15   19   Alk Phosphatase 38 - 126 U/L 77   81   Total Bilirubin 0.3 - 1.2 mg/dL 0.3   0.5     Lab Results  Component Value Date/Time   TSH 2.880 10/10/2021 03:37 PM   TSH 2.470 05/02/2021 03:08 PM   FREET4 1.50 10/10/2021 03:37 PM   FREET4 1.36 05/02/2021 03:08 PM       Latest Ref Rng & Units 09/26/2021    1:56 PM 03/28/2021    9:57 AM 02/27/2021   10:30 AM  CBC  WBC 4.0 - 10.5 K/uL 7.8  8.2  9.7   Hemoglobin 12.0 - 15.0 g/dL 11.2  11.7  11.5   Hematocrit 36.0 - 46.0 % 32.5  35.0  32.8   Platelets 150 - 400 K/uL 176  228  208     Lab Results  Component Value Date/Time   VD25OH 81.3 02/09/2020 02:43 PM    Clinical ASCVD: No   The 10-year ASCVD risk score (Arnett DK, et al., 2019) is: 34.7%   Values used to calculate the score:     Age: 17 years     Sex: Female     Is Non-Hispanic African American: Yes     Diabetic: Yes     Tobacco smoker: No     Systolic Blood Pressure: 846 mmHg     Is BP treated: Yes     HDL Cholesterol: 73 mg/dL     Total Cholesterol: 158 mg/dL       10/10/2021    2:46 PM 10/10/2021    2:41 PM 09/20/2020    2:30 PM  Depression screen PHQ 2/9  Decreased Interest 0 0 0  Down, Depressed, Hopeless 0 0 0  PHQ - 2 Score 0 0 0     Social History   Tobacco Use  Smoking Status Former   Packs/day: 0.25   Years: 10.00   Total pack years: 2.50   Types: Cigarettes  Smokeless Tobacco Never  Tobacco Comments   quit 20 years ago   BP Readings from Last 3 Encounters:  12/09/21 132/80  12/03/21 (!) 142/78  10/31/21 130/78   Pulse Readings from Last 3 Encounters:  12/09/21 75  12/03/21 91  10/31/21 63   Wt Readings from Last 3 Encounters:  12/09/21 197 lb 6.4 oz (89.5 kg)  12/03/21 193 lb (87.5 kg)  10/31/21 204 lb (92.5 kg)   BMI Readings from Last 3 Encounters:  12/09/21 32.85 kg/m  12/03/21 32.12 kg/m  10/31/21 33.95 kg/m    Assessment/Interventions: Review of patient past medical history, allergies, medications, health status, including review of consultants reports, laboratory and other test data, was performed as part of comprehensive evaluation and provision of chronic care management services.   SDOH:  (Social Determinants of Health) assessments and interventions performed: Yes SDOH Interventions    Flowsheet Row Telephone from 05/20/2021 in [place] Management from 03/28/2021 in [place] Management from 08/15/2020 in [facility] Chronic Care Management from 03/15/2020 in [place] Management from 12/01/2019 in [facility] Chronic Care Management from 09/27/2019 in [facility]  SDOH Interventions        Food Insecurity Interventions -- Intervention Not Indicated -- -- -- --  Housing Interventions -- Intervention Not Indicated -- -- -- --  Transportation Interventions Intervention  Not Indicated, Other (Comment)  [Patient approved for SCAT 2023-2027] Intervention Not Indicated -- -- -- --  Depression Interventions/Treatment  -- -- -- Counseling -- --  Financial Strain Interventions -- -- Other (Comment)  [patient assistance is active for patient, patient has concerns about housing will flet CCM SW know.] -- Other (Comment)  [Continue to assist with patient assistance as needed] Other (Comment)  [Medications affordable except insulin when in coverage gap. Assist as needed]      SDOH Screenings   Food Insecurity: No Food Insecurity (10/10/2021)  Housing: Low Risk  (03/28/2021)  Transportation Needs: No Transportation Needs (10/10/2021)  Alcohol Screen: Low Risk  (07/15/2018)  Depression (PHQ2-9): Low Risk  (10/10/2021)  Financial Resource Strain: Low Risk  (10/10/2021)  Physical Activity: Inactive (10/10/2021)  Social Connections: Unknown (07/15/2018)  Stress: No Stress Concern Present (10/10/2021)  Tobacco Use: Medium Risk (12/03/2021)    CCM Care Plan  Allergies  Allergen Reactions   Meloxicam Other (See Comments)    Fever; muscle aches; "flu-like" symptoms "Allergic," per [facility]   Other Other (See Comments)    "Rose fever and hay fever"    Medications Reviewed Today     Reviewed by Mayford Knife, [place] (Pharmacist) on 12/11/21 at [place] List Status: <None>   Medication Order Taking? Sig Documenting Provider Last Dose Status Informant  allopurinol (ZYLOPRIM) 100 MG tablet [id number]  Take 0.5 tablets (50 mg total) by mouth daily. Glendale Chard, MD  Active   amLODipine [facility]) 5 MG tablet [id number]  Take 1 tablet by mouth once daily Glendale Chard, MD  Active    AZO-CRANBERRY PO [id number]  Take 1 tablet by mouth daily. [provider]  Active Self  B Complex-C (B-COMPLEX WITH VITAMIN C) tablet [id number]  Take 1 tablet by mouth 3 (three) times a week. [provider]  Active Nursing Home Medication Administration Guide (MAG)  Biotin 10000 MCG TABS [id number]  Take 10,000 mcg by mouth daily. [provider]  Active Nursing Home Medication Administration Guide (MAG)  cephALEXin (KEFLEX) 500 MG capsule [id number]  Take 1 capsule (500 mg total) by mouth 3 (three) times daily for 7 days. Glendale Chard, MD  Active   cetirizine (ZYRTEC) 10 MG tablet [id number]  Take 10 mg by mouth at bedtime. [provider]  Active Self  Cholecalciferol 5000 units TABS [id number]  Take 5,000 Units by mouth daily. [provider]  Active Nursing Home Medication Administration Guide (MAG)           Med Note Jimmey Ralph, [place] I   Fri Aug 07, 2017  6:51 PM)    colchicine 0.6 MG tablet [id number]  Take 1 tablet by mouth for gout attack. Glendale Chard, MD  Active   diclofenac Sodium (VOLTAREN) 1 % GEL [id number]  APPLY 2 GRAMS TO AFFECTED AREA(S) 4 TIMES DAILY AS NEEDED  Patient taking differently: Apply 2-4 g topically every 6 (six) hours as needed (to affected areas).   Glendale Chard, MD  Active   gabapentin (NEURONTIN) 300 MG capsule [id number]  Take 600 mg by mouth at bedtime. [provider]  Active Nursing Home Medication Administration Guide (MAG)  glucose blood [facility] VERIO) test strip [id number]  USE TO CHECK BLOOD SUGAR   TWO TIMES A DAY AS         INSTRUCTED Glendale Chard, MD  Active   Insulin Degludec (TRESIBA) 100 UNIT/ML SOLN [id number]  Inject 8 Units/oz/day into the skin. Patient is taking at bedtime [provider]  Active   Lancets (ONETOUCH DELICA PLUS [id number]) MISC [id number]  USE TO CHECK BLOOD SUGARS  TWO TIMES A DAY AS         INSTRUCTED Glendale Chard, MD  Active   levothyroxine (SYNTHROID) 25 MCG  tablet [id number]  Take 25 mcg by mouth daily before breakfast. Taking 1 tablet every other day, and a 1/2 tablet on other days. [provider]  Active   losartan (COZAAR) 100 MG tablet [id number]  TAKE 1 TABLET DAILY Glendale Chard, MD  Active   metoprolol succinate (TOPROL-XL) 25 MG 24 hr tablet [id number]  TAKE 1 TABLET AT BEDTIME   (DISCONTINUE COREG) Glendale Chard, MD  Active   Multiple Vitamins-Minerals (MULTIVITAMIN WITH MINERALS) tablet [id number]  Take 1 tablet by mouth daily. Women's 50+ [provider]  Active Nursing Home Medication Administration Guide (MAG)           Med Note Jimmey Ralph, [facility] I   Fri Aug 07, 2017  6:53 PM)    naproxen sodium (ALEVE) 220 MG tablet [id number]  Take 220 mg by mouth at bedtime. [provider]  Active Self  pantoprazole (PROTONIX) 40 MG tablet [id number]  Take 40 mg by mouth daily. [provider]  Active   Semaglutide, 1 MG/DOSE, (OZEMPIC, 1 MG/DOSE,) 4 MG/3ML SOPN [id number]  Inject 1 mg into the skin once a week. Glendale Chard, MD  Active     Discontinued 11/06/20 1600 (Reorder) simvastatin (ZOCOR) 10 MG tablet [id number]  Take 1 tablet (10 mg total) by mouth daily. Glendale Chard, MD  Active   traMADol Veatrice [place]bon) 50 MG tablet [id number]  Take 1 tablet (50 mg total) by mouth every 6 (six) hours as needed. Glendale Chard, MD  Active   Med List Note Dessie Coma, CPhT 11/02/20 2136): Admitted to [facility] on 10/22/2020 Kirstie Mirza) [place], [place]            Patient Active Problem List   Diagnosis Date Noted   Acute cystitis without hematuria 12/03/2021   Acute idiopathic gout of right ankle 10/20/2021   Primary insomnia 10/20/2021   Class 1 obesity due to excess calories with serious comorbidity and body mass index (BMI) of 33.0 to 33.9 in adult 10/20/2021   Acute venous embolism and thrombosis of deep vessels of proximal lower extremity ([place]) 04/05/2021   Infected wound 11/02/2020   Pressure injury of skin  10/22/2020   Wound infection after surgery 10/12/2020   Normocytic anemia 10/12/2020   AKI (acute kidney injury) ([place]) 10/12/2020   Obesity (BMI 30-39.9) 10/12/2020   Sepsis ([place]) 10/12/2020   Type 2 diabetes mellitus with stage 3 chronic kidney disease, with long-term current use of insulin ([place]) 11/29/2019   Panniculitis 09/30/2019   Back pain 09/30/2019   Hepatoma ([place]) 01/21/2019   Other cirrhosis of liver ([place]) 07/05/2018   Hypertensive nephropathy 07/05/2018   Stage 3a chronic kidney disease ([place]) 07/05/2018   Chronic left shoulder pain 07/05/2018   Primary hypothyroidism 02/06/2018   Hepatitis C virus infection cured after antiviral drug therapy 02/04/2018   Osteoarthritis of right knee 07/30/2017   S/P laparoscopic sleeve gastrectomy July 2018 08/26/2016   Preop cardiovascular exam 07/30/2016   Dyspnea on exertion 07/30/2016   Bilateral lower extremity edema 07/30/2016    Immunization History  Administered Date(s) Administered   Fluad Quad(high Dose 65+) 11/10/2018, 11/29/2019, 11/22/2020   Influenza, High Dose Seasonal PF 12/21/2017, 11/10/2018   Influenza,inj,quad, With Preservative 02/25/2016   Influenza-Unspecified 10/25/2016   Moderna Sars-Covid-2 Vaccination 03/28/2019,  04/25/2019, 01/02/2020, 07/06/2020   PNEUMOCOCCAL CONJUGATE-20 08/03/2020   Pneumococcal Polysaccharide-23 11/02/2013   Pneumococcal-Unspecified 11/24/2016   Zoster Recombinat (Shingrix) 11/03/2018, 12/31/2018    Conditions to be addressed/monitored:  Hypertension, Hyperlipidemia, and Diabetes  Care Plan : [place]  Updates made by Mayford Knife, RPH since 12/11/2021 12:00 AM     Problem: HTN, HLD, DM II   Priority: High     Long-Range Goal: Disease Management   Start Date: 04/11/2020  Recent Progress: On track  Priority: High  Note:   Current Barriers:  Unable to independently afford treatment regimen Unable to independently monitor therapeutic  efficacy  Pharmacist Clinical Goal(s):  Patient will verbalize ability to afford treatment regimen through collaboration with PharmD and provider.   Interventions: 1:1 collaboration with Glendale Chard, MD regarding development and update of comprehensive plan of care as evidenced by provider attestation and co-signature Inter-disciplinary care team collaboration (see longitudinal plan of care) Comprehensive medication review performed; medication list updated in electronic medical record  Hypertension (BP goal <130/80) -Not ideally controlled -Current treatment: Amlodipine 5 mg tablet once per day Appropriate, Effective, Safe, Accessible Losartan 100 mg tablet once per day Appropriate, Effective, Safe, Accessible Metoprolol succinate 8mg tablet once daily Appropriate, Effective, Safe, Accessible -Current home readings: 132/80, 135/80 - elevations since having UTI, she is going to continue checking her BP a  -Current dietary habits: salads, including chicken Caesar salad  -Current exercise: three times per week, for an hour  -Denies hypotensive/hypertensive symptoms -Educated on Importance of home blood pressure monitoring; Proper BP monitoring technique; -Counseled to monitor BP at home at least twice per week, document, and provide log at future appointments -Recommended to continue current medication   Hyperlipidemia: (LDL goal < 70) -Controlled -Current treatment: Simvastatin 10 mg tablet by mouth once daily  -Current exercise habits: she has recently enrolled in the PREP program and she is excited about  -Educated on Cholesterol goals;  Benefits of statin for ASCVD risk reduction; -Recommended to continue current medication  Diabetes (A1c goal <7%) -Controlled -Current medications: Ozempic 1 mg once a week Appropriate, Effective, Safe, Accessible She currently has 5 boxes and 4 applications in each  Tresiba 8 units into the skin at bedtime Appropriate, Effective, Safe,  Accessible She has at least 5 pens from the patient assistance -Current home glucose readings fasting glucose: 118, 125, 128 -Denies hypoglycemic/hyperglycemic symptoms -Current meal patterns: we discussed [place] and has items that she is going to try. She is going to bring an insulated bad, and make sure to not over stuff herself and take items home. -Current exercise: three times per week, for an hour  -Educated on A1c and blood sugar goals; Benefits of weight loss; Carbohydrate counting and/or plate method -Counseled to check feet daily and get yearly eye exams -Recommended to continue current medication  Patient Goals/Self-Care Activities Patient will:  - take medications as prescribed as evidenced by patient report and record review  Follow Up Plan: The patient has been provided with contact information for the care management team and has been advised to call with any health related questions or concerns.       Medication Assistance:  TTyler Aasand Ozempic obtained through [employer] medication assistance program.  Enrollment ends 01/2022    Patient is aware that medication assistance program application will be sent to her through pharmacy team at the end of year for completion. She reports having plenty of medication as  noted  in visit notes.   Compliance/Adherence/Medication fill history: Care Gaps: Flu shot COVID-19 Booster   Star-Rating Drugs: Ozempic 1 mg Simvastatin 10 mg tablet    Patient's preferred pharmacy is:  [facility], [address]. [address]. [place] [address] Phone: [phone] Fax: [phone]  [facility] [place] [facility] [address] [facility] [address] [place] [address] Phone: [phone] Fax: [phone]  Uses pill box? Yes Pt endorses 95% compliance  We discussed:  Benefits of medication synchronization, packaging and delivery as well as enhanced pharmacist oversight with [employer]. Patient decided to: Continue current medication management strategy  Care Plan and Follow Up Patient Decision:  Patient agrees to Care Plan and Follow-up.  Plan: The patient has been provided with contact information for the care management team and has been advised to call with any health related questions or concerns.  No further follow up required: at this time patient is meeting all goals, patient is aware that she can follow up with me and or HC with any questions. Patient is aware that medication assistance program application will be sent to her through pharmacy team at the end of year for completion. She reports having plenty of medication.   [name], CPP, PharmD Clinical Pharmacist Practitioner [facility] [phone]

## 2021-12-11 NOTE — Patient Instructions (Addendum)
Visit Information It was great speaking with you today!  Please let me know if you have any questions about our visit.   Goals Addressed             This Visit's Progress    Manage My Medicine       Timeframe:  Long-Range Goal Priority:  High Start Date:    04/11/2020                         Expected End Date:                       Follow Up Date : none scheduled at this time, will be determined pending review   - call for medicine refill 2 or 3 days before it runs out - call if I am sick and can't take my medicine - keep a list of all the medicines I take; vitamins and herbals too - use an alarm clock or phone to remind me to take my medicine    Why is this important?   These steps will help you keep on track with your medicines.  Notes: please call if you have any questions            Patient Care Plan: CCM Pharmacy Care Plan     Problem Identified: HTN, HLD, DM II   Priority: High     Long-Range Goal: Disease Management   Start Date: 04/11/2020  Recent Progress: On track  Priority: High  Note:   Current Barriers:  Unable to independently afford treatment regimen Unable to independently monitor therapeutic efficacy  Pharmacist Clinical Goal(s):  Patient will verbalize ability to afford treatment regimen through collaboration with PharmD and provider.   Interventions: 1:1 collaboration with Glendale Chard, MD regarding development and update of comprehensive plan of care as evidenced by provider attestation and co-signature Inter-disciplinary care team collaboration (see longitudinal plan of care) Comprehensive medication review performed; medication list updated in electronic medical record  Hypertension (BP goal <130/80) -Not ideally controlled -Current treatment: Amlodipine 5 mg tablet once per day Appropriate, Effective, Safe, Accessible Losartan 100 mg tablet once per day Appropriate, Effective, Safe, Accessible Metoprolol succinate 39mg tablet once  daily Appropriate, Effective, Safe, Accessible -Current home readings: 132/80, 135/80 - elevations since having UTI, she is going to continue checking her BP a  -Current dietary habits: salads, including chicken Caesar salad  -Current exercise: three times per week, for an hour  -Denies hypotensive/hypertensive symptoms -Educated on Importance of home blood pressure monitoring; Proper BP monitoring technique; -Counseled to monitor BP at home at least twice per week, document, and provide log at future appointments -Recommended to continue current medication   Hyperlipidemia: (LDL goal < 70) -Controlled -Current treatment: Simvastatin 10 mg tablet by mouth once daily  -Current exercise habits: she has recently enrolled in the PREP program and she is excited about  -Educated on Cholesterol goals;  Benefits of statin for ASCVD risk reduction; -Recommended to continue current medication  Diabetes (A1c goal <7%) -Controlled -Current medications: Ozempic 1 mg once a week Appropriate, Effective, Safe, Accessible She currently has 5 boxes and 4 applications in each  Tresiba 8 units into the skin at bedtime Appropriate, Effective, Safe, Accessible She has at least 5 pens from the patient assistance -Current home glucose readings fasting glucose: 118, 125, 128 -Denies hypoglycemic/hyperglycemic symptoms -Current meal patterns: we discussed [place]  items that she is going to try. She is going to bring an insulated bad, and make sure to not over stuff herself and take items home. -Current exercise: three times per week, for an hour  -Educated on A1c and blood sugar goals; Benefits of weight loss; Carbohydrate counting and/or plate method -Counseled to check feet daily and get yearly eye exams -Recommended to continue current medication  Patient Goals/Self-Care Activities Patient will:  - take medications as prescribed as evidenced by patient  report and record review  Follow Up Plan: The patient has been provided with contact information for the care management team and has been advised to call with any health related questions or concerns.       Patient agreed to services and verbal consent obtained.   The patient verbalized understanding of instructions, educational materials, and care plan provided today and agreed to receive a mailed copy of patient instructions, educational materials, and care plan.   [name], PharmD Clinical Pharmacist [facility] [phone]

## 2021-12-17 ENCOUNTER — Ambulatory Visit (INDEPENDENT_AMBULATORY_CARE_PROVIDER_SITE_OTHER): Payer: Medicare HMO

## 2021-12-17 VITALS — BP 136/70 | HR 70 | Temp 97.7°F

## 2021-12-17 DIAGNOSIS — Z23 Encounter for immunization: Secondary | ICD-10-CM

## 2021-12-17 NOTE — Patient Instructions (Signed)
Influenza Virus Vaccine injection What is this medication? INFLUENZA VIRUS VACCINE (in floo EN zuh VAHY ruhs vak SEEN) helps to reduce the risk of getting influenza also known as the flu. The vaccine only helps protect you against some strains of the flu. This medicine may be used for other purposes; ask your health care provider or pharmacist if you have questions. COMMON BRAND NAME(S): Afluria, Afluria Quadrivalent, Agriflu, Alfuria, FLUAD, FLUAD Quadrivalent, Fluarix, Fluarix Quadrivalent, Flublok, Flublok Quadrivalent, FLUCELVAX, FLUCELVAX Quadrivalent, Flulaval, Flulaval Quadrivalent, Fluvirin, Fluzone, Fluzone High-Dose, Fluzone Intradermal, Fluzone Quadrivalent What should I tell my care team before I take this medication? They need to know if you have any of these conditions: bleeding disorder like hemophilia fever or infection Guillain-Barre syndrome or other neurological problems immune system problems infection with the human immunodeficiency virus (HIV) or AIDS low blood platelet counts multiple sclerosis an unusual or allergic reaction to influenza virus vaccine, latex, other medicines, foods, dyes, or preservatives. Different brands of vaccines contain different allergens. Some may contain latex or eggs. Talk to your doctor about your allergies to make sure that you get the right vaccine. pregnant or trying to get pregnant breast-feeding How should I use this medication? This vaccine is for injection into a muscle or under the skin. It is given by a health care professional. A copy of Vaccine Information Statements will be given before each vaccination. Read this sheet carefully each time. The sheet may change frequently. Talk to your healthcare provider to see which vaccines are right for you. Some vaccines should not be used in all age groups. Overdosage: If you think you have taken too much of this medicine contact a poison control center or emergency room at once. NOTE: This  medicine is only for you. Do not share this medicine with others. What if I miss a dose? This does not apply. What may interact with this medication? chemotherapy or radiation therapy medicines that lower your immune system like etanercept, anakinra, infliximab, and adalimumab medicines that treat or prevent blood clots like warfarin phenytoin steroid medicines like prednisone or cortisone theophylline vaccines This list may not describe all possible interactions. Give your health care provider a list of all the medicines, herbs, non-prescription drugs, or dietary supplements you use. Also tell them if you smoke, drink alcohol, or use illegal drugs. Some items may interact with your medicine. What should I watch for while using this medication? Report any side effects that do not go away within 3 days to your doctor or health care professional. Call your health care provider if any unusual symptoms occur within 6 weeks of receiving this vaccine. You may still catch the flu, but the illness is not usually as bad. You cannot get the flu from the vaccine. The vaccine will not protect against colds or other illnesses that may cause fever. The vaccine is needed every year. What side effects may I notice from receiving this medication? Side effects that you should report to your doctor or health care professional as soon as possible: allergic reactions like skin rash, itching or hives, swelling of the face, lips, or tongue Side effects that usually do not require medical attention (report to your doctor or health care professional if they continue or are bothersome): fever headache muscle aches and pains pain, tenderness, redness, or swelling at the injection site tiredness This list may not describe all possible side effects. Call your doctor for medical advice about side effects. You may report side effects to FDA at 1-800-FDA-1088.   Where should I keep my medication? The vaccine will be given  by a health care professional in a clinic, pharmacy, doctor's office, or other health care setting. You will not be given vaccine doses to store at home. NOTE: This sheet is a summary. It may not cover all possible information. If you have questions about this medicine, talk to your doctor, pharmacist, or health care provider.  2023 Elsevier/Gold Standard (2020-09-14 00:00:00)  

## 2021-12-17 NOTE — Progress Notes (Signed)
Patient presents today for influenza vaccine.  

## 2021-12-20 NOTE — Progress Notes (Signed)
[facility] PREP Weekly Session  Patient Details  Name: Teresa Golden MRN: [id number] Date of Birth: [birth year] Age: 76 y.o. PCP: Glendale Chard, MD  Vitals:   12/17/21 1300  Weight: 205 lb 6.4 oz (93.2 kg)     [facility] Weekly seesion - 12/20/21 1300       [facility] "PREP" Location   [facility] "PREP" Location [facility]      Weekly Session   Topic Discussed Importance of resistance training;Other ways to be active    Minutes exercised this week 160 minutes    Classes attended to date [place] 12/20/2021, 1:36 PM

## 2021-12-21 ENCOUNTER — Other Ambulatory Visit: Payer: Self-pay | Admitting: Internal Medicine

## 2021-12-24 DIAGNOSIS — E1159 Type 2 diabetes mellitus with other circulatory complications: Secondary | ICD-10-CM | POA: Diagnosis not present

## 2021-12-24 DIAGNOSIS — I10 Essential (primary) hypertension: Secondary | ICD-10-CM

## 2021-12-24 DIAGNOSIS — Z794 Long term (current) use of insulin: Secondary | ICD-10-CM

## 2021-12-24 DIAGNOSIS — E785 Hyperlipidemia, unspecified: Secondary | ICD-10-CM | POA: Diagnosis not present

## 2021-12-25 ENCOUNTER — Ambulatory Visit: Payer: Self-pay

## 2021-12-25 NOTE — Progress Notes (Signed)
[facility] PREP Weekly Session  Patient Details  Name: Teresa Golden MRN: [id number] Date of Birth: 08-28-1945 Age: 76 y.o. PCP: Glendale Chard, MD  Vitals:   12/24/21 1300  Weight: 206 lb 3.2 oz (93.5 kg)     [facility] Weekly seesion - 12/25/21 1200       [facility] "PREP" Location   [facility] "PREP" Location [facility]      Weekly Session   Topic Discussed Healthy eating tips    Minutes exercised this week 220 minutes    Classes attended to date [place] 12/25/2021, 12:41 PM

## 2021-12-25 NOTE — Patient Outreach (Signed)
  Care Coordination   Follow Up Visit Note   12/25/2021 Name: Teresa Golden MRN: [id number] DOB: August 12, 1945  Teresa Golden is a 75 y.o. year old female who sees Glendale Chard, MD for primary care. I spoke with  [name] by phone today.  What matters to the patients health and wellness today?  Patient is requesting assistance with counseling for anxiety/depression.     Goals Addressed               This Visit's Progress     Patient Stated     I would like a new referral for counseling (pt-stated)        .Care Coordination Interventions: Received inbound call from patient requesting references for new mental health referral for counseling Evaluation of current treatment plan related to anxiety/depression and patient's adherence to plan as established by provider Educated patient regarding the availability of LCSW Christa See, patient would like to speak with [place]ly [place] in basket message to [facility] requesting she contact patient for counseling for anxiety/depression           SDOH assessments and interventions completed:  No     Care Coordination Interventions Activated:  Yes  Care Coordination Interventions:  Yes, provided   Follow up plan: Follow up call scheduled for 01/13/22 '@0900'$  AM    Encounter Outcome:  Pt. Visit Completed

## 2021-12-25 NOTE — Patient Instructions (Signed)
Visit Information  Thank you for taking time to visit with me today. Please don't hesitate to contact me if I can be of assistance to you.   Following are the goals we discussed today:   Goals Addressed               This Visit's Progress     Patient Stated     I would like a new referral for counseling (pt-stated)        .Care Coordination Interventions: Received inbound call from patient requesting references for new mental health referral for counseling Evaluation of current treatment plan related to anxiety/depression and patient's adherence to plan as established by provider Educated patient regarding the availability of LCSW Christa See, patient would like to speak with Curly [place] in basket message to [facility] requesting she contact patient for counseling for anxiety/depression           Our next appointment is by telephone on 01/13/22 at 0900 AM  Please call the care guide team at [phone] if you need to cancel or reschedule your appointment.   If you are experiencing a Mental Health or [place] or need someone to talk to, please call 1-800-273-TALK (toll free, 24 hour hotline) go to [facility] [address] [place] [phone])  Patient verbalizes understanding of instructions and care plan provided today and agrees to view in [place]. Active MyChart status and patient understanding of how to access instructions and care plan via MyChart confirmed with patient.     Barb Merino, RN, BSN, CCM Care Management Coordinator [facility] Care Management Direct Phone: [phone]

## 2021-12-26 ENCOUNTER — Telehealth: Payer: Self-pay | Admitting: Licensed Clinical Social Worker

## 2021-12-26 NOTE — Patient Outreach (Signed)
  Care Coordination   Initial Visit Note   12/26/2021 Name: Teresa Golden MRN: [id number] DOB: 1945/11/09  Teresa Golden is a 76 y.o. year old female who sees Teresa Chard, MD for primary care. I spoke with  Teresa Golden by phone today.  What matters to the patients health and wellness today?  Care Coordination    Goals Addressed             This Visit's Progress    Supportive Resources   On track    Care Coordination Interventions: Active listening / Reflection utilized  LCSW informed patient of role with care coordination services. Pt is interested and requested LCSW to schedule appt for 12/27/21         SDOH assessments and interventions completed:  No     Care Coordination Interventions Activated:  Yes  Care Coordination Interventions:  Yes, provided   Follow up plan: Follow up call scheduled for 12/27/21    Encounter Outcome:  Pt. Scheduled   Teresa Golden, MSW, [email or web address] Phone [phone] 5:14 PM

## 2021-12-26 NOTE — Patient Instructions (Signed)
Visit Information  Thank you for taking time to visit with me today. Please don't hesitate to contact me if I can be of assistance to you.   Following are the goals we discussed today:   Goals Addressed             This Visit's Progress    Supportive Resources   On track    Care Coordination Interventions: Active listening / Reflection utilized  LCSW informed patient of role with care coordination services. Pt is interested and requested LCSW to schedule appt for 12/27/21         Our next appointment is by telephone on 11/3 at 10:30 AM  Please call the care guide team at [phone] if you need to cancel or reschedule your appointment.   If you are experiencing a Mental Health or [place] or need someone to talk to, please call the Suicide and Crisis Lifeline: 988 call 911   Patient verbalizes understanding of instructions and care plan provided today and agrees to view in [place]. Active MyChart status and patient understanding of how to access instructions and care plan via MyChart confirmed with patient.     Christa See, MSW, [email or web address] Phone [phone] 5:15 PM

## 2021-12-27 ENCOUNTER — Ambulatory Visit: Payer: Self-pay | Admitting: Licensed Clinical Social Worker

## 2021-12-27 NOTE — Patient Outreach (Signed)
  Care Coordination   Initial Visit Note   12/27/2021 Name: Teresa Golden MRN: [id number] DOB: [birth year]  Teresa Golden is a 76 y.o. year old female who sees Teresa Chard, MD for primary care. I spoke with  Teresa Golden by phone today.  What matters to the patients health and wellness today?  Stress management, coping skills, supportive resources    Goals Addressed             This Visit's Progress    Supportive Resources   On track    Care Coordination Interventions: Solution-Focused Strategies employed:  Active listening / Reflection utilized  Emotional Support Provided Behavioral Activation reviewed Provided psychoeducation for mental health needs  Participation in counseling encouraged  Participation in support group encouraged  LCSW identified patient strengths  Patient participates in therapy through [place] [facility]. Endorses stress from ongoing changes within administration resulting in fees being applied to pt's account. Patient is open to receiving local counseling agencies. Prefers AA female therapist (pt did not [facility]) Patient was referred to Prep Program (12 week program 2 x week) through [facility] Pt attends Tues/Thurs "I'm feeling a lot of improvement" Sleep has improved Patient is not interested in med management. Interested in group therapy Patient has limited support through local friends  Patient utilizes SCAT, Lyft, and iRide Healthy coping skills discussed Patient has great insight on how this time of year can trigger symptoms of depression and anxiety. Plan discussed to decrease risk             SDOH assessments and interventions completed:  Yes  SDOH Interventions Today    Flowsheet Row Most Recent Value  SDOH Interventions   Housing Interventions Intervention Not Indicated  Transportation Interventions Intervention Not Indicated        Care Coordination Interventions Activated:  Yes  Care Coordination Interventions:  Yes, provided    Follow up plan: Follow up call scheduled for 2-4 weeks    Encounter Outcome:  Pt. Visit Completed   Teresa Golden, MSW, [email or web address] Phone [phone] 4:12 PM

## 2021-12-27 NOTE — Patient Instructions (Signed)
Visit Information  Thank you for taking time to visit with me today. Please don't hesitate to contact me if I can be of assistance to you.   Following are the goals we discussed today:   Goals Addressed             This Visit's Progress    Supportive Resources   On track    Care Coordination Interventions: Solution-Focused Strategies employed:  Active listening / Reflection utilized  Emotional Support Provided Behavioral Activation reviewed Provided psychoeducation for mental health needs  Participation in counseling encouraged  Participation in support group encouraged  LCSW identified patient strengths  Patient participates in therapy through [place]. Endorses stress from ongoing changes within administration resulting in fees being applied to pt's account. Patient is open to receiving local counseling agencies. Prefers AA female therapist (pt did not enjoy [facility]) Patient was referred to Prep Program (12 week program 2 x week) through [facility] Pt attends Tues/Thurs "I'm feeling a lot of improvement" Sleep has improved Patient is not interested in med management. Interested in group therapy Patient has limited support through local friends  Patient utilizes SCAT, Lyft, and iRide Healthy coping skills discussed Patient has great insight on how this time of year can trigger symptoms of depression and anxiety. Plan discussed to decrease risk             Our next appointment is by telephone on 12/1 at 11 AM  Please call the care guide team at [phone] if you need to cancel or reschedule your appointment.   If you are experiencing a Mental Health or [place] or need someone to talk to, please call the Suicide and Crisis Lifeline: 988 call 911   Patient verbalizes understanding of instructions and care plan provided today and agrees to view in [place]. Active MyChart status and patient understanding of how to access instructions and care plan via  MyChart confirmed with patient.     Christa See, MSW, [email or web address] Phone [phone] 4:14 PM

## 2022-01-01 ENCOUNTER — Telehealth: Payer: Self-pay

## 2022-01-01 NOTE — Telephone Encounter (Signed)
Call from pt this am. Expressed difficulty with having the energy for PREP.  Inquired could she do the class another time. Needs it to be in the morning instead of afternoon.  Advised could drop her from the this class and call her for the next morning class which will start in Jan. Pt agreeable to plan

## 2022-01-04 ENCOUNTER — Other Ambulatory Visit: Payer: Self-pay | Admitting: Internal Medicine

## 2022-01-06 ENCOUNTER — Telehealth: Payer: Self-pay | Admitting: Licensed Clinical Social Worker

## 2022-01-07 NOTE — Patient Outreach (Signed)
  Care Coordination   Follow Up Visit Note   01/07/2022 Name: Teresa Golden MRN: [id number] DOB: 07-Aug-1945  Teresa Golden is a 76 y.o. year old female who sees Glendale Chard, MD for primary care. I  did not engage patient during this encounter  What matters to the patients health and wellness today?  LCSW e-mailed counseling resources to patient    Goals Addressed             This Visit's Progress    Supportive Resources       Care Coordination Interventions: Solution-Focused Strategies employed:  Active listening / Reflection utilized  Emotional Support Provided Behavioral Activation reviewed Provided psychoeducation for mental health needs  Participation in counseling encouraged  Participation in support group encouraged  LCSW identified patient strengths  Patient participates in therapy through [place]. Endorses stress from ongoing changes within administration resulting in fees being applied to pt's account. Patient is open to receiving local counseling agencies. Prefers AA female therapist (pt did not [facility]) 11/13: LCSW emailed listing of counseling resources to preferred e-mail Patient was referred to Prep Program (12 week program 2 x week) through [facility] Pt attends Tues/Thurs "I'm feeling a lot of improvement" Sleep has improved Patient is not interested in med management. Interested in group therapy Patient has limited support through local friends  Patient utilizes SCAT, Lyft, and iRide Healthy coping skills discussed Patient has great insight on how this time of year can trigger symptoms of depression and anxiety. Plan discussed to decrease risk             SDOH assessments and interventions completed:  No     Care Coordination Interventions Activated:  Yes  Care Coordination Interventions:  Yes, provided   Follow up plan: Follow up call scheduled for 01/09/22    Encounter Outcome:  Pt. Visit Completed   Christa See, MSW, [email or web address] Phone [phone] 7:36 AM

## 2022-01-07 NOTE — Patient Instructions (Signed)
Visit Information  Thank you for taking time to visit with me today. Please don't hesitate to contact me if I can be of assistance to you.   Following are the goals we discussed today:   Goals Addressed             This Visit's Progress    Supportive Resources       Care Coordination Interventions: Solution-Focused Strategies employed:  Active listening / Reflection utilized  Emotional Support Provided Behavioral Activation reviewed Provided psychoeducation for mental health needs  Participation in counseling encouraged  Participation in support group encouraged  LCSW identified patient strengths  Patient participates in therapy through [place]. Endorses stress from ongoing changes within administration resulting in fees being applied to pt's account. Patient is open to receiving local counseling agencies. Prefers AA female therapist (pt did not [facility]) 11/13: LCSW emailed listing of counseling resources to preferred e-mail Patient was referred to Prep Program (12 week program 2 x week) through [facility] Pt attends Tues/Thurs "I'm feeling a lot of improvement" Sleep has improved Patient is not interested in med management. Interested in group therapy Patient has limited support through local friends  Patient utilizes SCAT, Lyft, and iRide Healthy coping skills discussed Patient has great insight on how this time of year can trigger symptoms of depression and anxiety. Plan discussed to decrease risk             Our next appointment is by telephone on 01/09/22  Please call the care guide team at [phone] if you need to cancel or reschedule your appointment.   If you are experiencing a Mental Health or [place] or need someone to talk to, please call the Suicide and Crisis Lifeline: 988 call 911   Patient verbalizes understanding of instructions and care plan provided today and agrees to view in [place]. Active MyChart status and patient  understanding of how to access instructions and care plan via MyChart confirmed with patient.

## 2022-01-09 ENCOUNTER — Ambulatory Visit: Payer: Self-pay | Admitting: Licensed Clinical Social Worker

## 2022-01-10 NOTE — Patient Outreach (Signed)
  Care Coordination   Follow Up Visit Note   01/10/2022 Name: Teresa Golden MRN: [id number] DOB: 1945-12-24  Teresa Golden is a 76 y.o. year old female who sees Glendale Chard, MD for primary care. I spoke with  [name] by phone today.  What matters to the patients health and wellness today?  Management of symptoms    Goals Addressed             This Visit's Progress    Supportive Resources   On track    Care Coordination Interventions: Solution-Focused Strategies employed:  Active listening / Reflection utilized  Emotional Support Provided Behavioral Activation reviewed Provided psychoeducation for mental health needs  Participation in counseling encouraged  Participation in support group encouraged  Pt contacted [facility] and scheduled initial appt for 03/10/22 to establish with a therapist.  Pt endorses "seasonal and holiday blues" States that she spends a couple of days unmotivated or staying in the home. Pt shared that she went to bible study and cleaned home, running errands Pt states she is working on being assertive and advocating for self. States she is being intentional about doing what is right for her Patient shared that afternoon sessions with [facility] were not working for her. She scheduled to begin sessions in the morning, when available in the new year, January. Referral doesn't expire for a year Pt is interested in losing 25-40 more pounds. States she has loss over 100 thus far LCSW commended pt on prioritizing self, avoiding conflicts, and identifying unhealthy patterns by using reflection             SDOH assessments and interventions completed:  No     Care Coordination Interventions Activated:  Yes  Care Coordination Interventions:  Yes, provided   Follow up plan: Follow up call scheduled for 6-8 weeks    Encounter Outcome:  Pt. Visit Completed   Christa See, MSW, [email or web address] Phone [phone] 5:31 AM

## 2022-01-10 NOTE — Patient Instructions (Signed)
Visit Information  Thank you for taking time to visit with me today. Please don't hesitate to contact me if I can be of assistance to you.   Following are the goals we discussed today:   Goals Addressed             This Visit's Progress    Supportive Resources   On track    Care Coordination Interventions: Solution-Focused Strategies employed:  Active listening / Reflection utilized  Emotional Support Provided Behavioral Activation reviewed Provided psychoeducation for mental health needs  Participation in counseling encouraged  Participation in support group encouraged  Pt contacted [facility] and scheduled initial appt for 03/10/22 to establish with a therapist.  Pt endorses "seasonal and holiday blues" States that she spends a couple of days unmotivated or staying in the home. Pt shared that she went to bible study and cleaned home, running errands Pt states she is working on being assertive and advocating for self. States she is being intentional about doing what is right for her Patient shared that afternoon sessions with [facility] were not working for her. She scheduled to begin sessions in the morning, when available in the new year, January. Referral doesn't expire for a year Pt is interested in losing 25-40 more pounds. States she has loss over 100 thus far LCSW commended pt on prioritizing self, avoiding conflicts, and identifying unhealthy patterns by using reflection             Our next appointment is by telephone on 03/18/22 at 9 AM  Please call the care guide team at [phone] if you need to cancel or reschedule your appointment.   If you are experiencing a Mental Health or [place] or need someone to talk to, please call the Suicide and Crisis Lifeline: 988 call 911   Patient verbalizes understanding of instructions and care plan provided today and agrees to view in [place]. Active MyChart status and patient understanding of how to access  instructions and care plan via MyChart confirmed with patient.     Teresa Golden, MSW, [email or web address] Phone [phone] 5:31 AM

## 2022-01-22 ENCOUNTER — Ambulatory Visit: Payer: Self-pay

## 2022-01-22 NOTE — Patient Outreach (Signed)
  Care Coordination   Follow Up Visit Note   01/22/2022 Name: Teresa Golden MRN: [id number] DOB: 11-Sep-1945  Teresa Golden is a 76 y.o. year old female who sees Glendale Chard, MD for primary care. I spoke with  [name] by phone today.  What matters to the patients health and wellness today?  Patient has concerns about her worsening numbness and tingling to her fingers on both hands.     Goals Addressed               This Visit's Progress     Patient Stated     I am having worsening numbness and tingling in my fingers (pt-stated)        Care Coordination Interventions: Evaluation of current treatment plan related to tingling and numbness to fingers  and patient's adherence to plan as established by provider Determined patient is experiencing worsening numbness and tingling to her fingers on both hands Determined patient has reported symptoms to Dr. Baird Cancer and has followed her recommendations including wearing a hand brace to support her thumb Reviewed medications with patient and discussed importance of medication adherence Discussed patient continues to take Gabapentin as directed Determined patient is established with [facility] for other orthopedic needs, she will call today to schedule a follow up with a hand specialist to have her symptoms further evaluated  Reviewed upcoming scheduled PCP follow up with Dr. Baird Cancer scheduled for 03/03/22 '@0920'$  AM          SDOH assessments and interventions completed:  No     Care Coordination Interventions:  Yes, provided   Follow up plan: Follow up call scheduled for 03/05/22 '@2'$ :30 PM    Encounter Outcome:  Pt. Visit Completed

## 2022-01-22 NOTE — Patient Outreach (Signed)
  Care Coordination   01/22/2022 Name: LAEKYN RAYOS MRN: [id number] DOB: March 10, 1945   Care Coordination Outreach Attempts:  An unsuccessful telephone outreach was attempted for a scheduled appointment today.  Follow Up Plan:  Additional outreach attempts will be made to offer the patient care coordination information and services.   Encounter Outcome:  No Answer   Care Coordination Interventions:  No, not indicated    Barb Merino, RN, BSN, CCM Care Management Coordinator [facility] Care Management Direct Phone: [phone]

## 2022-01-22 NOTE — Patient Instructions (Signed)
Visit Information  Thank you for taking time to visit with me today. Please don't hesitate to contact me if I can be of assistance to you.   Following are the goals we discussed today:   Goals Addressed               This Visit's Progress     Patient Stated     I am having worsening numbness and tingling in my fingers (pt-stated)        Care Coordination Interventions: Evaluation of current treatment plan related to tingling and numbness to fingers  and patient's adherence to plan as established by provider Determined patient is experiencing worsening numbness and tingling to her fingers on both hands Determined patient has reported symptoms to Dr. Baird Cancer and has followed her recommendations including wearing a hand brace to support her thumb Reviewed medications with patient and discussed importance of medication adherence Discussed patient continues to take Gabapentin as directed Determined patient is established with [facility] for other orthopedic needs, she will call today to schedule a follow up with a hand specialist to have her symptoms further evaluated  Reviewed upcoming scheduled PCP follow up with Dr. Baird Cancer scheduled for 03/03/22 '@0920'$  AM          Our next appointment is by telephone on 03/05/22 at 2:30 PM   Please call the care guide team at [phone] if you need to cancel or reschedule your appointment.   If you are experiencing a Mental Health or [place] or need someone to talk to, please call 1-800-273-TALK (toll free, 24 hour hotline)  Patient verbalizes understanding of instructions and care plan provided today and agrees to view in [place]. Active MyChart status and patient understanding of how to access instructions and care plan via MyChart confirmed with patient.     Barb Merino, RN, BSN, CCM Care Management Coordinator [facility]  Direct Phone: [phone]

## 2022-01-24 ENCOUNTER — Telehealth: Payer: Self-pay

## 2022-01-24 ENCOUNTER — Encounter: Payer: Medicare HMO | Admitting: Licensed Clinical Social Worker

## 2022-01-24 NOTE — Telephone Encounter (Signed)
Received call from pt inquiring about starting back to [facility] in January 1030a class.  VMT pt today. Tentatively will start on Jan 15th MW. Will call her in early Jan to schedule intake

## 2022-02-04 ENCOUNTER — Telehealth: Payer: Self-pay | Admitting: *Deleted

## 2022-02-04 ENCOUNTER — Telehealth: Payer: Self-pay

## 2022-02-04 ENCOUNTER — Other Ambulatory Visit: Payer: Self-pay | Admitting: Internal Medicine

## 2022-02-04 NOTE — Chronic Care Management (AMB) (Signed)
02-04-2022: Completed and uploaded 2024 re enrollment application for ozempic and tresiba.  [place] Pharmacist Assistant [phone]

## 2022-02-04 NOTE — Progress Notes (Signed)
  Care Coordination Note  02/04/2022 Name: Teresa Golden MRN: [id number] DOB: Apr 06, 1945  Teresa Golden is a 76 y.o. year old female who is a primary care patient of Teresa Chard, MD and is actively engaged with the care management team. I return called to Teresa Golden by phone today, She request to speak with Pharmacy Team about patient assistance paper work for [employer]. Routed message to [name].   [place]  Direct Dial: [phone]

## 2022-02-05 ENCOUNTER — Other Ambulatory Visit: Payer: Self-pay | Admitting: Internal Medicine

## 2022-02-18 ENCOUNTER — Telehealth: Payer: Self-pay

## 2022-02-18 NOTE — Telephone Encounter (Signed)
VMT pt requesting call back to discuss retrying [facility] on 03/03/22.

## 2022-03-03 ENCOUNTER — Encounter: Payer: Self-pay | Admitting: Internal Medicine

## 2022-03-03 ENCOUNTER — Ambulatory Visit (INDEPENDENT_AMBULATORY_CARE_PROVIDER_SITE_OTHER): Payer: Medicare HMO | Admitting: Internal Medicine

## 2022-03-03 VITALS — BP 124/70 | HR 58 | Temp 97.6°F | Ht 65.0 in | Wt 210.0 lb

## 2022-03-03 DIAGNOSIS — E039 Hypothyroidism, unspecified: Secondary | ICD-10-CM | POA: Diagnosis not present

## 2022-03-03 DIAGNOSIS — Z794 Long term (current) use of insulin: Secondary | ICD-10-CM

## 2022-03-03 DIAGNOSIS — E1122 Type 2 diabetes mellitus with diabetic chronic kidney disease: Secondary | ICD-10-CM | POA: Diagnosis not present

## 2022-03-03 DIAGNOSIS — E6609 Other obesity due to excess calories: Secondary | ICD-10-CM

## 2022-03-03 DIAGNOSIS — Z6834 Body mass index (BMI) 34.0-34.9, adult: Secondary | ICD-10-CM

## 2022-03-03 DIAGNOSIS — M79641 Pain in right hand: Secondary | ICD-10-CM

## 2022-03-03 DIAGNOSIS — N1831 Chronic kidney disease, stage 3a: Secondary | ICD-10-CM | POA: Diagnosis not present

## 2022-03-03 DIAGNOSIS — I129 Hypertensive chronic kidney disease with stage 1 through stage 4 chronic kidney disease, or unspecified chronic kidney disease: Secondary | ICD-10-CM | POA: Diagnosis not present

## 2022-03-03 LAB — POCT URINALYSIS DIPSTICK
Bilirubin, UA: NEGATIVE
Blood, UA: NEGATIVE
Glucose, UA: NEGATIVE
Ketones, UA: NEGATIVE
Nitrite, UA: NEGATIVE
Protein, UA: NEGATIVE
Spec Grav, UA: 1.015
Urobilinogen, UA: 0.2 U/dL
pH, UA: 5.5

## 2022-03-03 NOTE — Progress Notes (Signed)
Rich Brave [place],acting as a Education administrator for Maximino Greenland, MD.,have documented all relevant documentation on the behalf of Maximino Greenland, MD,as directed by  Maximino Greenland, MD while in the presence of Maximino Greenland, MD.    Subjective:     [name] , female    DOB: 08/15/1945 , 77 y.o.   MRN: [id number]   Chief Complaint  Patient presents with   Diabetes   Hypertension    HPI  Patient presents today for a diabetes and BP check. She reports compliance with meds. She denies having any headaches, chest pain and shortness of breath. She states her sugars are typically 110s/120s in am; however, she has had an episode of hypoglycemia as well. She is not sure what triggered this, she can't recall.   Patient reports she is having a lot of pain in her right hand. she is going to the orthopaedic on Wednesday to be evaluated. She denies fall/trauma. At times, awakens with hand stiffness that improves. She has also noticed some joint swelling in right hand. Denies having any RUE weakness, does endorse paresthesias on occasion.   Diabetes She presents for her follow-up diabetic visit. She has type 2 diabetes mellitus. Pertinent negatives for diabetes include no blurred vision. There are no hypoglycemic complications. Risk factors for coronary artery disease include diabetes mellitus, dyslipidemia, hypertension, obesity, sedentary lifestyle and post-menopausal. She is following a diabetic diet. She participates in exercise intermittently. Her home blood glucose trend is fluctuating minimally. Her breakfast blood glucose is taken between 8-9 am. Her breakfast blood glucose range is generally 110-130 mg/dl. An ACE inhibitor/angiotensin II receptor blocker is being taken. Eye exam is current.  Hypertension This is a chronic problem. The current episode started more than 1 year ago. The problem has been gradually improving since onset. The problem is uncontrolled. Pertinent negatives  include no blurred vision. Risk factors for coronary artery disease include diabetes mellitus, dyslipidemia, post-menopausal state and sedentary lifestyle. Past treatments include angiotensin blockers and beta blockers. The current treatment provides moderate improvement.     Past Medical History:  Diagnosis Date   Arthritis    Chronic kidney disease    self reports ckd stage 3    Depression    Diabetes mellitus, type II, insulin dependent ([place])    With neurologic complications. 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    Hypertensive nephropathy 07/05/2018   Hypothyroidism    Hypothyroidism 02/06/2018   Morbid obesity with BMI of 50.0-59.9, adult ([place])    Scoliosis      Family History  Problem Relation Age of Onset   Heart attack Mother    Heart failure Mother    Stroke Father      Current Outpatient Medications:    allopurinol (ZYLOPRIM) 100 MG tablet, Take 1/2 (one-half) tablet by mouth once daily, Disp: 30 tablet, Rfl: 0   amLODipine (NORVASC) 5 MG tablet, Take 1 tablet by mouth once daily, Disp: 90 tablet, Rfl: 0   AZO-CRANBERRY PO, Take 1 tablet by mouth daily., Disp: , Rfl:    B Complex-C (B-COMPLEX WITH VITAMIN C) tablet, Take 1 tablet by mouth 3 (three) times a week., Disp: , Rfl:  Biotin 10000 MCG TABS, Take 10,000 mcg by mouth daily., Disp: , Rfl:    cetirizine (ZYRTEC) 10 MG tablet, Take 10 mg by mouth at bedtime., Disp: , Rfl:    Cholecalciferol 5000 units TABS, Take 5,000 Units by mouth daily., Disp: , Rfl:    diclofenac Sodium (VOLTAREN) 1 % GEL, APPLY 2 GRAMS TO AFFECTED AREA(S) 4 TIMES DAILY AS NEEDED (Patient taking differently: Apply 2-4 g topically every 6 (six) hours as needed (to affected areas).), Disp: 200 g, Rfl:  2   gabapentin (NEURONTIN) 300 MG capsule, Take 600 mg by mouth at bedtime., Disp: , Rfl:    glucose blood (ONETOUCH VERIO) test strip, USE TO CHECK BLOOD SUGAR   TWO TIMES A DAY AS         INSTRUCTED, Disp: 200 strip, Rfl: 3   Insulin Degludec (TRESIBA) 100 UNIT/ML SOLN, Inject 8 Units/oz/day into the skin. Patient is taking at bedtime, Disp: , Rfl:    Lancets (ONETOUCH DELICA PLUS [id number]) MISC, USE TO CHECK BLOOD SUGARS  TWO TIMES A DAY AS         INSTRUCTED, Disp: 200 each, Rfl: 3   losartan (COZAAR) 100 MG tablet, TAKE 1 TABLET DAILY, Disp: 90 tablet, Rfl: 2   metoprolol succinate (TOPROL-XL) 25 MG 24 hr tablet, TAKE 1 TABLET AT BEDTIME   (DISCONTINUE COREG), Disp: 90 tablet, Rfl: 1   Multiple Vitamins-Minerals (MULTIVITAMIN WITH MINERALS) tablet, Take 1 tablet by mouth daily. Women's 50+, Disp: , Rfl:    naproxen sodium (ALEVE) 220 MG tablet, Take 220 mg by mouth at bedtime., Disp: , Rfl:    pantoprazole (PROTONIX) 40 MG tablet, Take 40 mg by mouth daily as needed., Disp: , Rfl:    Semaglutide, 1 MG/DOSE, (OZEMPIC, 1 MG/DOSE,) 4 MG/3ML SOPN, Inject 1 mg into the skin once a week., Disp: 9 mL, Rfl: 1   simvastatin (ZOCOR) 10 MG tablet, Take 1 tablet by mouth once daily, Disp: 90 tablet, Rfl: 0   traMADol (ULTRAM) 50 MG tablet, Take 1 tablet (50 mg total) by mouth every 6 (six) hours as needed., Disp: 20 tablet, Rfl: 0   colchicine 0.6 MG tablet, Take 1 tablet by mouth for gout attack. (Patient not taking: Reported on 03/03/2022), Disp: 30 tablet, Rfl: 1   levothyroxine (SYNTHROID) 25 MCG tablet, Take 25 mcg by mouth daily before breakfast. Taking 1 tablet every other day, and a 1/2 tablet on other days. (Patient not taking: Reported on 03/03/2022), Disp: , Rfl:    Allergies  Allergen Reactions   Meloxicam Other (See Comments)    Fever; muscle aches; "flu-like" symptoms "Allergic," per [facility]   Other Other (See Comments)    "Rose fever and hay fever"     Review of Systems  Constitutional:  Negative.   Eyes: Negative.  Negative for blurred vision.  Respiratory: Negative.    Cardiovascular: Negative.   Gastrointestinal: Negative.   Musculoskeletal:  Positive for arthralgias.  Skin: Negative.   Neurological: Negative.   Psychiatric/Behavioral: Negative.       Today's Vitals   03/03/22 0923  BP: 124/70  Pulse: (!) 58  Temp: 97.6 F (36.4 C)  Weight: 210 lb (95.3 kg)  Height: '5\' 5"'$  (1.651 m)  PainSc: 5   PainLoc: Hand   Body mass index is 34.95 kg/m.  Wt Readings from Last 3 Encounters:  03/03/22 210 lb (95.3 kg)  12/24/21 206 lb 3.2 oz (93.5 kg)  12/17/21 205 lb 6.4 oz (93.2 kg)  Objective:  Physical Exam Vitals and nursing note reviewed.  Constitutional:      Appearance: Normal appearance.  HENT:     Head: Normocephalic and atraumatic.     Nose:     Comments: Masked     Mouth/Throat:     Comments: Masked  Eyes:     Extraocular Movements: Extraocular movements intact.  Cardiovascular:     Rate and Rhythm: Normal rate and regular rhythm.     Heart sounds: Normal heart sounds.  Pulmonary:     Effort: Pulmonary effort is normal.     Breath sounds: Normal breath sounds.  Musculoskeletal:        General: Swelling and tenderness present.     Cervical back: Normal range of motion.     Comments: Pos squeeze test on R, neg on L  Ambulatory with walker  Skin:    General: Skin is warm.  Neurological:     General: No focal deficit present.     Mental Status: She is alert.  Psychiatric:        Mood and Affect: Mood normal.        Behavior: Behavior normal.       Assessment And Plan:     1. Type 2 diabetes mellitus with stage 3a chronic kidney disease, with long-term current use of insulin (HCC) Comments: Chronic, I will check labs as below. I do plan on weaning her off of [name], we discussed use of SGLT2 inh to decrease renal/cardiac risk.  She was given samples of Jardiance '10mg'$ , advised to NOT take until we reviewed her lab results.  -  Hemoglobin A1c - Microalbumin / Creatinine Urine Ratio - POCT Urinalysis Dipstick (81002)  2. Hypertensive nephropathy Comments: Chronic, well controlled.  She will c/w amlodipine '5mg'$  and losartan '100mg'$  daily. She is encouraged to follow low sodium diet. - BMP8+EGFR - CBC no Diff  3. Primary hypothyroidism Comments: Chronic, currently on levothyroxine 18mg daily. I will check thyroid panel and adjust meds as needed. - TSH - T4, Free  4. Right hand pain Comments: Squeeze test pos on the right. I will check an arthritis panel. Encouraged to keep Ortho appt on Wed, advised she will likely have x-rays. - ANA, IFA (with reflex) - CYCLIC CITRUL PEPTIDE ANTIBODY, IGG/IGA - Rheumatoid factor - Sedimentation rate - Uric acid  5. Class 1 obesity due to excess calories with serious comorbidity and body mass index (BMI) of 34.0 to 34.9 in adult Comments: She has gained 5lbs since October. Encouraged to aim for at least 150 mnutes of exercise per week, while aiming for BMI<30 to decrease cardiac risk.   Patient was given opportunity to ask questions. Patient verbalized understanding of the plan and was able to repeat key elements of the plan. All questions were answered to their satisfaction.   I, RMaximino Greenland MD, have reviewed all documentation for this visit. The documentation on 03/03/22 for the exam, diagnosis, procedures, and orders are all accurate and complete.   IF YOU HAVE BEEN REFERRED TO A SPECIALIST, IT MAY TAKE 1-2 WEEKS TO SCHEDULE/PROCESS THE REFERRAL. IF YOU HAVE NOT HEARD FROM US/SPECIALIST IN TWO WEEKS, PLEASE GIVE [place] CALL AT [phone].   THE PATIENT IS ENCOURAGED TO PRACTICE SOCIAL DISTANCING DUE TO THE COVID-19 PANDEMIC.

## 2022-03-03 NOTE — Patient Instructions (Signed)

## 2022-03-04 LAB — MICROALBUMIN / CREATININE URINE RATIO
Creatinine, Urine: 80.5 mg/dL
Microalb/Creat Ratio: 27 mg/g creat (ref 0–29)
Microalbumin, Urine: 22 ug/mL

## 2022-03-04 LAB — HM DIABETES EYE EXAM

## 2022-03-05 ENCOUNTER — Ambulatory Visit: Payer: Self-pay

## 2022-03-05 DIAGNOSIS — G5601 Carpal tunnel syndrome, right upper limb: Secondary | ICD-10-CM | POA: Insufficient documentation

## 2022-03-05 LAB — BMP8+EGFR
BUN/Creatinine Ratio: 17 (ref 12–28)
BUN: 18 mg/dL (ref 8–27)
CO2: 21 mmol/L (ref 20–29)
Calcium: 9.6 mg/dL (ref 8.7–10.3)
Chloride: 107 mmol/L — ABNORMAL HIGH (ref 96–106)
Creatinine, Ser: 1.04 mg/dL — ABNORMAL HIGH (ref 0.57–1.00)
Glucose: 98 mg/dL (ref 70–99)
Potassium: 4.3 mmol/L (ref 3.5–5.2)
Sodium: 144 mmol/L (ref 134–144)
eGFR: 55 mL/min/{1.73_m2} — ABNORMAL LOW (ref >59)

## 2022-03-05 LAB — CBC
Hematocrit: 33.9 % — ABNORMAL LOW (ref 34.0–46.6)
Hemoglobin: 11.6 g/dL (ref 11.1–15.9)
MCH: 27.1 pg (ref 26.6–33.0)
MCHC: 34.2 g/dL (ref 31.5–35.7)
MCV: 79 fL (ref 79–97)
Platelets: 215 10*3/uL (ref 150–450)
RBC: 4.28 x10E6/uL (ref 3.77–5.28)
RDW: 14.1 % (ref 11.7–15.4)
WBC: 8.1 10*3/uL (ref 3.4–10.8)

## 2022-03-05 LAB — T4, FREE: Free T4: 1.4 ng/dL (ref 0.82–1.77)

## 2022-03-05 LAB — HEMOGLOBIN A1C
Est. average glucose Bld gHb Est-mCnc: 140 mg/dL
Hgb A1c MFr Bld: 6.5 % — ABNORMAL HIGH (ref 4.8–5.6)

## 2022-03-05 LAB — RHEUMATOID FACTOR: Rheumatoid fact SerPl-aCnc: 17.6 IU/mL — ABNORMAL HIGH (ref <14.0)

## 2022-03-05 LAB — ANTINUCLEAR ANTIBODIES, IFA: ANA Titer 1: NEGATIVE

## 2022-03-05 LAB — URIC ACID: Uric Acid: 6.4 mg/dL (ref 3.1–7.9)

## 2022-03-05 LAB — CYCLIC CITRUL PEPTIDE ANTIBODY, IGG/IGA: Cyclic Citrullin Peptide Ab: 8 units (ref 0–19)

## 2022-03-05 LAB — SEDIMENTATION RATE: Sed Rate: 33 mm/hr (ref 0–40)

## 2022-03-05 LAB — TSH: TSH: 3.59 u[IU]/mL (ref 0.450–4.500)

## 2022-03-05 NOTE — Patient Outreach (Signed)
  Care Coordination   03/05/2022 Name: CALI HOPE MRN: [id number] DOB: 23-Nov-1945   Care Coordination Outreach Attempts:  An unsuccessful telephone outreach was attempted for a scheduled appointment today.  Follow Up Plan:  Additional outreach attempts will be made to offer the patient care coordination information and services.   Encounter Outcome:  No Answer   Care Coordination Interventions:  No, not indicated    Barb Merino, RN, BSN, CCM Care Management Coordinator [facility] Care Management  Direct Phone: [phone]

## 2022-03-07 ENCOUNTER — Telehealth: Payer: Self-pay | Admitting: *Deleted

## 2022-03-07 NOTE — Progress Notes (Signed)
  Care Coordination Note  03/07/2022 Name: Teresa Golden MRN: [id number] DOB: [birth year]  Teresa Golden is a 77 y.o. year old female who is a primary care patient of Glendale Chard, MD and is actively engaged with the care management team. I reached out to [name] by phone today to assist with re-scheduling a follow up visit with the RN Case Manager  Follow up plan: Telephone appointment with care management team member scheduled for:03/31/22  [place]: [phone]

## 2022-03-07 NOTE — Progress Notes (Signed)
  Care Coordination Note  03/07/2022 Name: KIRSTAN FENTRESS MRN: [id number] DOB: 1946-02-20  STARNISHA BATREZ is a 77 y.o. year old female who is a primary care patient of Glendale Chard, MD and is actively engaged with the care management team. I reached out to [name] by phone today to assist with re-scheduling a follow up visit with the RN Case Manager  Follow up plan: Unsuccessful telephone outreach attempt made. A HIPAA compliant phone message was left for the patient providing contact information and requesting a return call.  [place]  Direct Dial: [phone]

## 2022-03-10 ENCOUNTER — Ambulatory Visit (INDEPENDENT_AMBULATORY_CARE_PROVIDER_SITE_OTHER): Payer: Medicare HMO | Admitting: Psychiatry

## 2022-03-10 DIAGNOSIS — F4323 Adjustment disorder with mixed anxiety and depressed mood: Secondary | ICD-10-CM

## 2022-03-10 DIAGNOSIS — R69 Illness, unspecified: Secondary | ICD-10-CM

## 2022-03-10 DIAGNOSIS — Z630 Problems in relationship with spouse or partner: Secondary | ICD-10-CM | POA: Diagnosis not present
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
## 2022-03-11 ENCOUNTER — Encounter (INDEPENDENT_AMBULATORY_CARE_PROVIDER_SITE_OTHER): Payer: Self-pay

## 2022-03-17 ENCOUNTER — Other Ambulatory Visit: Payer: Self-pay | Admitting: Internal Medicine

## 2022-03-17 ENCOUNTER — Other Ambulatory Visit: Payer: Self-pay

## 2022-03-17 ENCOUNTER — Encounter: Payer: Self-pay | Admitting: Internal Medicine

## 2022-03-18 ENCOUNTER — Encounter: Payer: Self-pay | Admitting: Licensed Clinical Social Worker

## 2022-03-18 ENCOUNTER — Telehealth: Payer: Self-pay | Admitting: Licensed Clinical Social Worker

## 2022-03-18 NOTE — Patient Outreach (Signed)
  Care Coordination   03/18/2022 Name: Teresa Golden MRN: [id number] DOB: Dec 30, 1945   Care Coordination Outreach Attempts:  An unsuccessful telephone outreach was attempted for a scheduled appointment today.  Follow Up Plan:  Additional outreach attempts will be made to offer the patient care coordination information and services.   Encounter Outcome:  No Answer   Care Coordination Interventions:  No, not indicated    Christa See, MSW, [email or web address] Phone [phone] 9:07 AM

## 2022-03-19 ENCOUNTER — Telehealth: Payer: Self-pay | Admitting: Licensed Clinical Social Worker

## 2022-03-20 ENCOUNTER — Other Ambulatory Visit: Payer: Self-pay

## 2022-03-20 MED ORDER — LEVOTHYROXINE SODIUM 25 MCG PO TABS: ORAL_TABLET | ORAL | 1 refills | Status: DC

## 2022-03-20 NOTE — Patient Outreach (Signed)
  Care Coordination   Follow Up Visit Note   03/19/22 Name: Teresa Golden MRN: [id number] DOB: 09/11/1945  Teresa Golden is a 77 y.o. year old female who sees Glendale Chard, MD for primary care. I spoke with  [name] by phone today.  What matters to the patients health and wellness today?  Care Coordination/Medication Management    Goals Addressed             This Visit's Progress    Supportive Resources   On track    Care Coordination Interventions: Solution-Focused Strategies employed:  Active listening / Reflection utilized  Emotional Support Provided Behavioral Activation reviewed Provided psychoeducation for mental health needs  Participation in counseling encouraged  Participation in support group encouraged  [facility] and scheduled initial appt for 03/10/22 to establish with a therapist.  Pt endorses "seasonal and holiday blues" States that she spends a couple of days unmotivated or staying in the home. Pt shared that she went to bible study and cleaned home, running errands Pt states she is working on being assertive and advocating for self. States she is being intentional about doing what is right for her Patient shared that afternoon sessions with [facility] were not working for her. She scheduled to begin sessions in the morning, when available in the new year, January. Referral doesn't expire for a year Pt is interested in losing 25-40 more pounds. States she has loss over 100 thus far LCSW commended pt on prioritizing self, avoiding conflicts, and identifying unhealthy patterns by using reflection             SDOH assessments and interventions completed:  No     Care Coordination Interventions:  Yes, provided   Follow up plan: Follow up call scheduled for 2-4 weeks    Encounter Outcome:  Pt. Visit Completed   Christa See, MSW, [email or web address] Phone [phone] 9:43 AM

## 2022-03-20 NOTE — Progress Notes (Signed)
[facility] class started on 12/10/21 finished 03/18/22 Pt withdrew secondary to illness.  Attended 5 sessions in total

## 2022-03-20 NOTE — Patient Instructions (Signed)
Visit Information  Thank you for taking time to visit with me today. Please don't hesitate to contact me if I can be of assistance to you.   Following are the goals we discussed today:   Goals Addressed             This Visit's Progress    Supportive Resources   On track    Care Coordination Interventions: Solution-Focused Strategies employed:  Active listening / Reflection utilized  Emotional Support Provided Behavioral Activation reviewed Provided psychoeducation for mental health needs  Participation in counseling encouraged  Participation in support group encouraged  Pt contacted [facility] and scheduled initial appt for 03/10/22 to establish with a therapist.  Pt endorses "seasonal and holiday blues" States that she spends a couple of days unmotivated or staying in the home. Pt shared that she went to bible study and cleaned home, running errands Pt states she is working on being assertive and advocating for self. States she is being intentional about doing what is right for her Patient shared that afternoon sessions with [facility] were not working for her. She scheduled to begin sessions in the morning, when available in the new year, January. Referral doesn't expire for a year Pt is interested in losing 25-40 more pounds. States she has loss over 100 thus far LCSW commended pt on prioritizing self, avoiding conflicts, and identifying unhealthy patterns by using reflection             Our next appointment is by telephone on 04/25/22 at 11 AM  Please call the care guide team at [phone] if you need to cancel or reschedule your appointment.   If you are experiencing a Mental Health or [place] or need someone to talk to, please call the Suicide and Crisis Lifeline: 988 call 911   Patient verbalizes understanding of instructions and care plan provided today and agrees to view in [place]. Active MyChart status and patient understanding of how to access  instructions and care plan via MyChart confirmed with patient.     Christa See, MSW, [email or web address] Phone [phone] 9:45 AM

## 2022-03-31 ENCOUNTER — Ambulatory Visit: Payer: Self-pay

## 2022-03-31 NOTE — Patient Outreach (Signed)
  Care Coordination   Follow Up Visit Note   03/31/2022 Name: Teresa Golden MRN: [id number] DOB: January 16, 1946  Teresa Golden is a 77 y.o. year old female who sees Glendale Chard, MD for primary care. I spoke with  [name] by phone today.  What matters to the patients health and wellness today?  Patient would like to lower her A1c <6.0%. She will undergo carpal tunnel surgery in the near future.     Goals Addressed               This Visit's Progress     Patient Stated     I am having worsening numbness and tingling in my fingers (pt-stated)        Care Coordination Interventions: Evaluation of current treatment plan related to tingling and numbness to fingers  and patient's adherence to plan as established by provider Determined patient completed an evaluation with [facility] due to having symptoms suggestive of Carpal Tunnel Discussed patient completed a nerve conduction study and was informed she will need surgical intervention for this condition due to the severity Reviewed and discussed with patient her next scheduled appointment to establish a plan of care is scheduled for 04/07/22      I want to lower my A1c to 6.0 (pt-stated)        Care Coordination Interventions: Evaluation of current treatment plan related to type 2 diabetes mellitus and patient's adherence to plan as established by provider Assessed patient's understanding of A1c goal: <6.5%, discussed patient's goal is to get below 6.0% Discussed patient will have carpal tunnel surgery in the near future Educated patient with rationale the risks for delayed healing with A1c >6.5%  Determined patient is monitoring her blood sugars at home and reports having improved sugars within goal range          COMPLETED: I would like a new referral for counseling (pt-stated)        .Care Coordination Interventions: Per chart review patient is receiving telephonic follow up per Christa See LCSW        SDOH  assessments and interventions completed:  No     Care Coordination Interventions:  Yes, provided   Follow up plan: Follow up call scheduled for 04/10/22 '@09'$ :30 AM    Encounter Outcome:  Pt. Visit Completed

## 2022-03-31 NOTE — Patient Outreach (Signed)
  Care Coordination   03/31/2022 Name: Teresa Golden MRN: [id number] DOB: [birth year]   Care Coordination Outreach Attempts:  An unsuccessful telephone outreach was attempted today to offer the patient information about available care coordination services as a benefit of their health plan.   Follow Up Plan:  Additional outreach attempts will be made to offer the patient care coordination information and services.   Encounter Outcome:  No Answer   Care Coordination Interventions:  No, not indicated    Barb Merino, RN, BSN, CCM Care Management Coordinator [facility] Care Management Direct Phone: [phone]

## 2022-03-31 NOTE — Patient Instructions (Signed)
Visit Information  Thank you for taking time to visit with me today. Please don't hesitate to contact me if I can be of assistance to you.   Following are the goals we discussed today:   Goals Addressed               This Visit's Progress     Patient Stated     I am having worsening numbness and tingling in my fingers (pt-stated)        Care Coordination Interventions: Evaluation of current treatment plan related to tingling and numbness to fingers  and patient's adherence to plan as established by provider Determined patient completed an evaluation with [facility] due to having symptoms suggestive of Carpal Tunnel Discussed patient completed a nerve conduction study and was informed she will need surgical intervention for this condition due to the severity Reviewed and discussed with patient her next scheduled appointment to establish a plan of care is scheduled for 04/07/22      I want to lower my A1c to 6.0 (pt-stated)        Care Coordination Interventions: Evaluation of current treatment plan related to type 2 diabetes mellitus and patient's adherence to plan as established by provider Assessed patient's understanding of A1c goal: <6.5%, discussed patient's goal is to get below 6.0% Discussed patient will have carpal tunnel surgery in the near future Educated patient with rationale the risks for delayed healing with A1c >6.5%  Determined patient is monitoring her blood sugars at home and reports having improved sugars within goal range          COMPLETED: I would like a new referral for counseling (pt-stated)        .Care Coordination Interventions: Per chart review patient is receiving telephonic follow up per Christa See LCSW        Our next appointment is by telephone on 04/10/22 at 09:30 AM  Please call the care guide team at [phone] if you need to cancel or reschedule your appointment.   If you are experiencing a Mental Health or [place]  or need someone to talk to, please go to [facility] [address] [place] [phone])  Patient verbalizes understanding of instructions and care plan provided today and agrees to view in [place]. Active MyChart status and patient understanding of how to access instructions and care plan via MyChart confirmed with patient.     Barb Merino, RN, BSN, CCM Care Management Coordinator [facility] Care Management Direct Phone: [phone]

## 2022-04-01 ENCOUNTER — Other Ambulatory Visit: Payer: Self-pay

## 2022-04-01 ENCOUNTER — Other Ambulatory Visit: Payer: Self-pay | Admitting: Internal Medicine

## 2022-04-01 MED ORDER — ALLOPURINOL 100 MG PO TABS: 100.0000 mg | ORAL_TABLET | Freq: Every day | ORAL | 2 refills | Status: DC

## 2022-04-07 DIAGNOSIS — M19049 Primary osteoarthritis, unspecified hand: Secondary | ICD-10-CM | POA: Insufficient documentation

## 2022-04-07 DIAGNOSIS — M19031 Primary osteoarthritis, right wrist: Secondary | ICD-10-CM | POA: Insufficient documentation

## 2022-04-10 ENCOUNTER — Ambulatory Visit: Payer: Self-pay

## 2022-04-10 NOTE — Patient Instructions (Signed)
Visit Information  Thank you for taking time to visit with me today. Please don't hesitate to contact me if I can be of assistance to you.   Following are the goals we discussed today:   Goals Addressed               This Visit's Progress     Patient Stated     I am having worsening numbness and tingling in my fingers (pt-stated)        Care Coordination Interventions: Evaluation of current treatment plan related to tingling and numbness to fingers  and patient's adherence to plan as established by provider Determined patient will undergo a right carpal tunnel release procedure on 05/23/22 by Dr. Roseanne Kaufman Determined patient verbalizes understanding of what to expect with this procedure and post operatively, she will use medical transportation via health plan benefit Determined patient feels her diabetes is well controlled with FBS in the 90's Discussed Dr. Liz Malady recommendation for a platform walker, determined patient has purchased this walker and will adhere to her surgeons recommendations          Our next appointment is by telephone on 05/26/22 at 1 PM  Please call the care guide team at [phone] if you need to cancel or reschedule your appointment.   If you are experiencing a Mental Health or [place] or need someone to talk to, please call 1-800-273-TALK (toll free, 24 hour hotline) go to [facility] [address] [place] [phone])  Patient verbalizes understanding of instructions and care plan provided today and agrees to view in [place]. Active MyChart status and patient understanding of how to access instructions and care plan via MyChart confirmed with patient.     Barb Merino, RN, BSN, CCM Care Management Coordinator [facility] Care Management Direct Phone: [phone]

## 2022-04-10 NOTE — Patient Outreach (Signed)
  Care Coordination   Follow Up Visit Note   04/10/2022 Name: Teresa Golden MRN: [id number] DOB: 1945/05/08  Teresa Golden is a 77 y.o. year old female who sees Glendale Chard, MD for primary care. I spoke with  [name] by phone today.  What matters to the patients health and wellness today?  Patient will undergo a right carpal tunnel release on 05/23/22.     Goals Addressed               This Visit's Progress     Patient Stated     I am having worsening numbness and tingling in my fingers (pt-stated)        Care Coordination Interventions: Evaluation of current treatment plan related to tingling and numbness to fingers  and patient's adherence to plan as established by provider Determined patient will undergo a right carpal tunnel release procedure on 05/23/22 by Dr. Roseanne Kaufman Determined patient verbalizes understanding of what to expect with this procedure and post operatively, she will use medical transportation via health plan benefit Determined patient feels her diabetes is well controlled with FBS in the 90's Discussed Dr. Liz Malady recommendation for a platform walker, determined patient has purchased this walker and will adhere to her surgeons recommendations          SDOH assessments and interventions completed:  No     Care Coordination Interventions:  Yes, provided   Follow up plan: Follow up call scheduled for 05/26/22 '@1'$  PM    Encounter Outcome:  Pt. Visit Completed

## 2022-04-15 ENCOUNTER — Telehealth: Payer: Self-pay | Admitting: Licensed Clinical Social Worker

## 2022-04-16 ENCOUNTER — Ambulatory Visit: Payer: Medicare HMO

## 2022-04-16 NOTE — Patient Instructions (Signed)
Visit Information  Thank you for taking time to visit with me today. Please don't hesitate to contact me if I can be of assistance to you.   Following are the goals we discussed today:   Goals Addressed               This Visit's Progress     Patient Stated     COMPLETED: I need to speak with the LCSW (pt-stated)        Care Coordination Interventions: Received an inbound call from patient stating she is having difficulty getting in touch with Christa See Provided patient with the contact number for Christa See LCSW, patient confirmed this is the number she tried calling, however she was getting a recording stating the number is out of service Sent in basket message to [facility] making her aware and asking that she reach out to patient at her next available opportunity  Discussed with patient to keep trying [facility] as the issue she experienced may have been a technical issue, patient verbalizes understanding and is agreeable         Our next appointment is by telephone on 05/26/22 at 1:00 PM  Please call the care guide team at [phone] if you need to cancel or reschedule your appointment.   If you are experiencing a Mental Health or [place] or need someone to talk to, please call 1-800-273-TALK (toll free, 24 hour hotline) go to [facility] [address] [place] [phone])  Patient verbalizes understanding of instructions and care plan provided today and agrees to view in [place]. Active MyChart status and patient understanding of how to access instructions and care plan via MyChart confirmed with patient.

## 2022-04-16 NOTE — Patient Outreach (Signed)
  Care Coordination   Follow Up Visit Note   04/16/2022 Name: Teresa Golden MRN: [id number] DOB: 05/27/1945  Teresa Golden is a 77 y.o. year old female who sees Glendale Chard, MD for primary care. I spoke with  [name] by phone today.  What matters to the patients health and wellness today?  Patient would like a call back from [facility] LCSW when possible.     Goals Addressed               This Visit's Progress     Patient Stated     COMPLETED: I need to speak with the LCSW (pt-stated)        Care Coordination Interventions: Received an inbound call from patient stating she is having difficulty getting in touch with Christa See Provided patient with the contact number for Christa See LCSW, patient confirmed this is the number she tried calling, however she was getting a recording stating the number is out of service Sent in basket message to [facility] making her aware and asking that she reach out to patient at her next available opportunity  Discussed with patient to keep trying [facility] as the issue she experienced may have been a technical issue, patient verbalizes understanding and is agreeable         SDOH assessments and interventions completed:  No     Care Coordination Interventions:  Yes, provided   Follow up plan: Follow up call scheduled for 05/26/22 @1$ :00 PM    Encounter Outcome:  Pt. Visit Completed

## 2022-04-17 ENCOUNTER — Telehealth: Payer: Self-pay

## 2022-04-17 NOTE — Patient Instructions (Signed)
Visit Information  Thank you for taking time to visit with me today. Please don't hesitate to contact me if I can be of assistance to you.   Following are the goals we discussed today:   Goals Addressed             This Visit's Progress    Supportive Resources   On track    Care Coordination Interventions: Solution-Focused Strategies employed:  Active listening / Reflection utilized  Emotional Support Provided Behavioral Activation reviewed Provided psychoeducation for mental health needs  Participation in counseling encouraged  Participation in support group encouraged  Pt contacted [facility] and scheduled initial appt for 03/10/22 to establish with a therapist.  Pt endorses "seasonal and holiday blues" States that she spends a couple of days unmotivated or staying in the home. Pt shared that she went to bible study and cleaned home, running errands Pt states she is working on being assertive and advocating for self. States she is being intentional about doing what is right for her Patient shared that afternoon sessions with [facility] were not working for her. She scheduled to begin sessions in the morning, when available in the new year, January. Referral doesn't expire for a year Pt is interested in losing 25-40 more pounds. States she has loss over 100 thus far LCSW commended pt on prioritizing self, avoiding conflicts, and identifying unhealthy patterns by using reflection             Our next appointment is by telephone on 02/23   Please call the care guide team at [phone] if you need to cancel or reschedule your appointment.   If you are experiencing a Mental Health or [place] or need someone to talk to, please call the Suicide and Crisis Lifeline: 988 call 911   Patient verbalizes understanding of instructions and care plan provided today and agrees to view in [place]. Active MyChart status and patient understanding of how to access instructions  and care plan via MyChart confirmed with patient.     Christa See, MSW, [email or web address] Phone [phone] 1:50 PM

## 2022-04-17 NOTE — Telephone Encounter (Signed)
Patient approved to receive Ozempic, [name] and Novofine needles through [employer] patient assistance, enrollment ends 02/24/2023.

## 2022-04-18 ENCOUNTER — Telehealth: Payer: Self-pay | Admitting: Licensed Clinical Social Worker

## 2022-04-21 NOTE — Patient Outreach (Addendum)
  Care Coordination   Follow Up Visit Note   Encounter completed on 04/18/22 Name: ALVERNA STUEWE MRN: [id number] DOB: Aug 13, 1945  DINIA ZIMMERLE is a 77 y.o. year old female who sees Glendale Chard, MD for primary care. I spoke with  [name] by phone today.  What matters to the patients health and wellness today?  Personal Care Aid    Goals Addressed             This Visit's Progress    Supportive Resources   On track    Care Coordination Interventions: Solution-Focused Strategies employed:  Active listening / Reflection utilized  Emotional Support Provided Behavioral Activation reviewed Provided psychoeducation for mental health needs  Participation in counseling encouraged  Participation in support group encouraged  Pt contacted [facility] and scheduled initial appt for 03/10/22 to establish with a therapist.  Pt endorses "seasonal and holiday blues" States that she spends a couple of days unmotivated or staying in the home. Pt shared that she went to bible study and cleaned home, running errands Pt states she is working on being assertive and advocating for self. States she is being intentional about doing what is right for her Patient shared that afternoon sessions with [facility] were not working for her. She scheduled to begin sessions in the morning, when available in the new year, January. Referral doesn't expire for a year Pt is interested in losing 25-40 more pounds. States she has loss over 100 thus far LCSW commended pt on prioritizing self, avoiding conflicts, and identifying unhealthy patterns by using reflection Pt will obtain a mailed copy of the claim form. Implements self-care and is insightful on whether she needs rest             SDOH assessments and interventions completed:  No     Care Coordination Interventions:  Yes, provided   Follow up plan: Follow up call scheduled for 1-2 weeks    Encounter Outcome:  Pt. Visit Completed   Christa See,  MSW, [email or web address] Phone [phone] 10:59 PM

## 2022-04-21 NOTE — Patient Instructions (Addendum)
Visit Information  Thank you for taking time to visit with me today. Please don't hesitate to contact me if I can be of assistance to you.   Following are the goals we discussed today:   Goals Addressed             This Visit's Progress    Supportive Resources   On track    Care Coordination Interventions: Solution-Focused Strategies employed:  Active listening / Reflection utilized  Emotional Support Provided Behavioral Activation reviewed Provided psychoeducation for mental health needs  Participation in counseling encouraged  Participation in support group encouraged  Pt contacted [facility] and scheduled initial appt for 03/10/22 to establish with a therapist.  Pt endorses "seasonal and holiday blues" States that she spends a couple of days unmotivated or staying in the home. Pt shared that she went to bible study and cleaned home, running errands Pt states she is working on being assertive and advocating for self. States she is being intentional about doing what is right for her Patient shared that afternoon sessions with [facility] were not working for her. She scheduled to begin sessions in the morning, when available in the new year, January. Referral doesn't expire for a year Pt is interested in losing 25-40 more pounds. States she has loss over 100 thus far LCSW commended pt on prioritizing self, avoiding conflicts, and identifying unhealthy patterns by using reflection Pt will obtain a mailed copy of the claim form. Implements self-care and is insightful on whether she needs rest             Our next appointment is by telephone on 04/25/22 at 11 AM  Please call the care guide team at [phone] if you need to cancel or reschedule your appointment.   If you are experiencing a Mental Health or [place] or need someone to talk to, please call the Suicide and Crisis Lifeline: 988 call 911   Patient verbalizes understanding of instructions and care plan  provided today and agrees to view in [place]. Active MyChart status and patient understanding of how to access instructions and care plan via MyChart confirmed with patient.     Teresa Golden, MSW, [email or web address] Phone [phone] 10:59 PM

## 2022-04-22 ENCOUNTER — Other Ambulatory Visit: Payer: Self-pay

## 2022-04-22 MED ORDER — LOSARTAN POTASSIUM 100 MG PO TABS: 100.0000 mg | ORAL_TABLET | Freq: Every day | ORAL | 2 refills | Status: DC

## 2022-04-25 ENCOUNTER — Encounter: Payer: Self-pay | Admitting: Licensed Clinical Social Worker

## 2022-04-25 ENCOUNTER — Telehealth: Payer: Self-pay | Admitting: Licensed Clinical Social Worker

## 2022-04-25 NOTE — Patient Outreach (Signed)
  Care Coordination   04/25/2022 Name: Teresa Golden MRN: [id number] DOB: 02/08/1946   Care Coordination Outreach Attempts:  An unsuccessful telephone outreach was attempted for a scheduled appointment today.  Follow Up Plan:  Additional outreach attempts will be made to offer the patient care coordination information and services.   Encounter Outcome:  No Answer   Care Coordination Interventions:  No, not indicated    Christa See, MSW, [email or web address] Phone [phone] 6:47 PM

## 2022-04-28 ENCOUNTER — Telehealth: Payer: Self-pay | Admitting: Licensed Clinical Social Worker

## 2022-04-29 ENCOUNTER — Ambulatory Visit: Payer: Medicare HMO | Admitting: Psychiatry

## 2022-04-29 NOTE — Progress Notes (Signed)
I,Teresa Golden,acting as a scribe for Teresa Greenland, MD.,have documented all relevant documentation on the behalf of Teresa Greenland, MD,as directed by  Teresa Greenland, MD while in the presence of Teresa Greenland, MD.    Subjective:     [name] , female    DOB: 17-Oct-1945 , 77 y.o.   MRN: [id number]   Chief Complaint  Patient presents with   Gout   Hypertension    HPI  Pt presents today for Gout & thyroid follow up. She reports compliance with medications. Denies headache, chest pain, SOB.  She reports being scheduled for hand surgery at [facility] on March 29th. 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      Past Medical History:  Diagnosis Date   Arthritis    Chronic kidney disease    self reports ckd stage 3    Depression    Diabetes mellitus, type II, insulin dependent ([place])    With neurologic complications. 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    Hypertensive nephropathy 07/05/2018   Hypothyroidism    Hypothyroidism 02/06/2018   Morbid obesity with BMI of 50.0-59.9, adult ([place])    Scoliosis      Family History  Problem Relation Age of Onset   Heart attack Mother    Heart failure Mother    Stroke Father      Current Outpatient Medications:    allopurinol (ZYLOPRIM) 100 MG tablet, Take 1 tablet (100 mg total) by mouth daily., Disp: 30 tablet, Rfl: 2   amLODipine (NORVASC) 5 MG tablet, Take 1 tablet by mouth once daily, Disp: 90 tablet, Rfl: 0   AZO-CRANBERRY PO, Take 1 tablet by mouth daily., Disp: , Rfl:    B Complex-C (B-COMPLEX WITH VITAMIN C) tablet, Take 1 tablet by mouth 3 (three) times a week., Disp: , Rfl:    Biotin 10000 MCG TABS, Take 10,000 mcg by  mouth daily., Disp: , Rfl:    cetirizine (ZYRTEC) 10 MG tablet, Take 10 mg by mouth at bedtime., Disp: , Rfl:    Cholecalciferol 5000 units TABS, Take 5,000 Units by mouth daily., Disp: , Rfl:    diclofenac Sodium (VOLTAREN) 1 % GEL, APPLY 2 GRAMS TO AFFECTED AREA(S) 4 TIMES DAILY AS NEEDED (Patient taking differently: Apply 2-4 g topically every 6 (six) hours as needed (to affected areas).), Disp: 200 g, Rfl: 2   gabapentin (NEURONTIN) 300 MG capsule, Take 600 mg by mouth at bedtime., Disp: , Rfl:    glucose blood (ONETOUCH VERIO) test strip, USE TO CHECK BLOOD SUGAR   TWO TIMES A DAY AS         INSTRUCTED, Disp: 200 strip, Rfl: 3   Insulin Degludec (TRESIBA) 100 UNIT/ML SOLN, Inject 8 Units/oz/day into the skin. Patient is taking at bedtime, Disp: , Rfl:    Lancets (ONETOUCH DELICA PLUS 123XX123) MISC, USE TO CHECK BLOOD SUGARS  TWO TIMES A DAY AS         INSTRUCTED, Disp: 200 each, Rfl: 3   levothyroxine (SYNTHROID) 25 MCG tablet, Take 1/2 tablet daily., Disp: 90 tablet, Rfl: 1   losartan (COZAAR) 100 MG tablet, Take 1 tablet (  100 mg total) by mouth daily., Disp: 90 tablet, Rfl: 2   metoprolol succinate (TOPROL-XL) 25 MG 24 hr tablet, TAKE 1 TABLET AT BEDTIME   (DISCONTINUE COREG), Disp: 90 tablet, Rfl: 1   Multiple Vitamins-Minerals (MULTIVITAMIN WITH MINERALS) tablet, Take 1 tablet by mouth daily. Women's 50+, Disp: , Rfl:    naproxen sodium (ALEVE) 220 MG tablet, Take 220 mg by mouth at bedtime., Disp: , Rfl:    pantoprazole (PROTONIX) 40 MG tablet, Take 40 mg by mouth daily as needed., Disp: , Rfl:    Semaglutide, 1 MG/DOSE, (OZEMPIC, 1 MG/DOSE,) 4 MG/3ML SOPN, Inject 1 mg into the skin once a week., Disp: 9 mL, Rfl: 1   simvastatin (ZOCOR) 10 MG tablet, Take 1 tablet by mouth once daily, Disp: 90 tablet, Rfl: 0   traMADol (ULTRAM) 50 MG tablet, Take 1 tablet (50 mg total) by mouth every 6 (six) hours as needed., Disp: 20 tablet, Rfl: 0   colchicine 0.6 MG tablet, Take 1 tablet by mouth for  gout attack. (Patient not taking: Reported on 03/03/2022), Disp: 30 tablet, Rfl: 1   Allergies  Allergen Reactions   Meloxicam Other (See Comments)    Fever; muscle aches; "flu-like" symptoms "Allergic," per [facility]   Other Other (See Comments)    "Rose fever and hay fever"     Review of Systems  Constitutional: Negative.   Respiratory: Negative.    Cardiovascular: Negative.   Gastrointestinal: Negative.   Neurological: Negative.   Psychiatric/Behavioral: Negative.       Today's Vitals   04/30/22 0918  BP: 138/84  Temp: 97.9 F (36.6 C)  SpO2: 98%  Weight: 212 lb 9.6 oz (96.4 kg)  Height: '5\' 5"'$  (1.651 m)   Body mass index is 35.38 kg/m.  Wt Readings from Last 3 Encounters:  04/30/22 212 lb 9.6 oz (96.4 kg)  03/03/22 210 lb (95.3 kg)  12/24/21 206 lb 3.2 oz (93.5 kg)    Objective:  Physical Exam Vitals and nursing note reviewed.  Constitutional:      Appearance: Normal appearance.  HENT:     Head: Normocephalic and atraumatic.     Nose:     Comments: Masked     Mouth/Throat:     Comments: Masked  Eyes:     Extraocular Movements: Extraocular movements intact.  Cardiovascular:     Rate and Rhythm: Normal rate and regular rhythm.     Heart sounds: Normal heart sounds.  Pulmonary:     Effort: Pulmonary effort is normal.     Breath sounds: Normal breath sounds.  Musculoskeletal:     Cervical back: Normal range of motion.     Comments: Ambulatory w/ walker  Skin:    General: Skin is warm.  Neurological:     General: No focal deficit present.     Mental Status: She is alert.  Psychiatric:        Mood and Affect: Mood normal.        Behavior: Behavior normal.      Assessment And Plan:     1. 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 She will f/u in April for her CPE. - Uric acid - BMP8+EGFR  2. Primary hypothyroidism Comments: I will check thyroid panel  today and adjust meds as needed. - TSH + free T4  3. Hypertensive nephropathy Comments: Chronic, fair control. Admits she hasn't taken her meds yet.  She will c/w losartan, metoprolol and amlodipine daily.  4. Stage 3a chronic kidney disease (HCC) Comments: Chronic, she is reminded to avoid NSAIDs, stay well hydrated and keep BP well controlled to decrease risk of CKD progression.  5. Class 2 severe obesity due to excess calories with serious comorbidity and body mass index (BMI) of 35.0 to 35.9 in adult [facility]) Comments: She is encouraged to aim for at least 150 minutes of exercise/week, while striving for BMI<30 to decrease cardiac risk.  Patient was given opportunity to ask questions. Patient verbalized understanding of the plan and was able to repeat key elements of the plan. All questions were answered to their satisfaction.   I, Teresa Greenland, MD, have reviewed all documentation for this visit. The documentation on 04/30/22 for the exam, diagnosis, procedures, and orders are all accurate and complete.   IF YOU HAVE BEEN REFERRED TO A SPECIALIST, IT MAY TAKE 1-2 WEEKS TO SCHEDULE/PROCESS THE REFERRAL. IF YOU HAVE NOT HEARD FROM US/SPECIALIST IN TWO WEEKS, PLEASE GIVE [place] A CALL AT [phone] X [phone].   THE PATIENT IS ENCOURAGED TO PRACTICE SOCIAL DISTANCING DUE TO THE COVID-19 PANDEMIC.

## 2022-04-29 NOTE — Patient Outreach (Signed)
  Care Coordination   Follow Up Visit Note   Encounter completed on 04/28/22 Name: ERSA SENSENEY MRN: [id number] DOB: 1945/07/17  YASAMAN KOZUB is a 77 y.o. year old female who sees Glendale Chard, MD for primary care. I spoke with  [name] by phone today.  What matters to the patients health and wellness today?  Symptom Management/Aid services    Goals Addressed             This Visit's Progress    Supportive Resources   On track    Activities and task to complete in order to accomplish goals.   Keep all upcoming appointment discussed today Continue with compliance of taking medication prescribed by Doctor Continue implementing self-care strategies Provide PCP office with claim form to initiate aid services after upcoming surgery              SDOH assessments and interventions completed:  No     Care Coordination Interventions:  Yes, provided  Interventions Today    Flowsheet Row Most Recent Value  Chronic Disease   Chronic disease during today's visit Diabetes, Chronic Kidney Disease/End Stage Renal Disease (ESRD)  General Interventions   General Interventions Discussed/Reviewed General Interventions Reviewed  [LCSW reviewed upcoming appts]  Mental Health Interventions   Mental Health Discussed/Reviewed Mental Health Reviewed, Coping Strategies  [LCSW assessed MH symptoms and encouraged continued self-care to manage stress]  Nutrition Interventions   Nutrition Discussed/Reviewed Nutrition Discussed       Follow up plan: Follow up call scheduled for 2 weeks    Encounter Outcome:  Pt. Visit Completed   Christa See, MSW, [email or web address] Phone [phone] 4:34 PM

## 2022-04-29 NOTE — Patient Instructions (Signed)
Gout  Gout is painful swelling of your joints. Gout is a type of arthritis. It is caused by having too much uric acid in your body. Uric acid is a chemical that is made when your body breaks down substances called purines. If your body has too much uric acid, sharp crystals can form and build up in your joints. This causes pain and swelling. Gout attacks can happen quickly and be very painful (acute gout). Over time, the attacks can affect more joints and happen more often (chronic gout). What are the causes? Gout is caused by too much uric acid in your blood. This can happen because: Your kidneys do not remove enough uric acid from your blood. Your body makes too much uric acid. You eat too many foods that are high in purines. These foods include organ meats, some seafood, and beer. Trauma or stress can bring on an attack. What increases the risk? Having a family history of gout. Being female and middle-aged. Being female and having gone through menopause. Having an organ transplant. Taking certain medicines. Having certain conditions, such as: Being very overweight (obese). Lead poisoning. Kidney disease. A skin condition called psoriasis. Other risks include: Losing weight too quickly. Not having enough water in the body (being dehydrated). Drinking alcohol, especially beer. Drinking beverages that are sweetened with a type of sugar called fructose. What are the signs or symptoms? An attack of acute gout often starts at night and usually happens in just one joint. The most common place is the big toe. Other joints that may be affected include joints of the feet, ankle, knee, fingers, wrist, or elbow. Symptoms may include: Very bad pain. Warmth. Swelling. Stiffness. Tenderness. The affected joint may be very painful to touch. Shiny, red, or purple skin. Chills and fever. Chronic gout may cause symptoms more often. More joints may be involved. You may also have K[place] or yellow lumps  (tophi) on your hands or feet or in other areas near your joints. How is this treated? Treatment for an acute attack may include medicines for pain and swelling, such as: NSAIDs, such as ibuprofen. Steroids taken by mouth or injected into a joint. Colchicine. This can be given by mouth or through an IV tube. Treatment to prevent future attacks may include: Taking small doses of NSAIDs or colchicine daily. Using a medicine that reduces uric acid levels in your blood, such as allopurinol. Making changes to your diet. You may need to see a food expert (dietitian) about what to eat and drink to prevent gout. Follow these instructions at home: During a gout attack  If told, put ice on the painful area. To do this: Put ice in a plastic bag. Place a towel between your skin and the bag. Leave the ice on for 20 minutes, 2-3 times a day. Take off the ice if your skin turns bright red. This is very important. If you cannot feel pain, heat, or cold, you have a greater risk of damage to the area. Raise the painful joint above the level of your heart as often as you can. Rest the joint as much as possible. If the joint is in your leg, you may be given crutches. Follow instructions from your doctor about what you cannot eat or drink. Avoiding future gout attacks Eat a low-purine diet. Avoid foods and drinks such as: Liver. Kidney. Anchovies. Asparagus. Herring. Mushrooms. Mussels. Beer. Stay at a healthy weight. If you want to lose weight, talk with your doctor. Do not  lose weight too fast. Start or continue an exercise plan as told by your doctor. Eating and drinking Avoid drinks sweetened by fructose. Drink enough fluids to keep your pee (urine) pale yellow. If you drink alcohol: Limit how much you have to: 0-1 drink a day for women who are not pregnant. 0-2 drinks a day for men. Know how much alcohol is in a drink. In the [place] one drink equals one 12 oz bottle of beer (355 mL), one 5 oz  glass of wine (148 mL), or one 1 oz glass of hard liquor (44 mL). General instructions Take over-the-counter and prescription medicines only as told by your doctor. Ask your doctor if you should avoid driving or using machines while you are taking your medicine. Return to your normal activities when your doctor says that it is safe. Keep all follow-up visits. Where to find more information [employer] [facility]: www.niams.SouthExposed.es Contact a doctor if: You have another gout attack. You still have symptoms of a gout attack after 10 days of treatment. You have problems (side effects) because of your medicines. You have chills or a fever. You have burning pain when you pee (urinate). You have pain in your lower back or belly. Get help right away if: You have very bad pain. Your pain cannot be controlled. You cannot pee. Summary Gout is painful swelling of the joints. The most common site of pain is the big toe, but it can affect other joints. Medicines and avoiding some foods can help to prevent and treat gout attacks. This information is not intended to replace advice given to you by your health care provider. Make sure you discuss any questions you have with your health care provider. Document Revised: 11/14/2020 Document Reviewed: 11/14/2020 Elsevier Patient Education  [place].

## 2022-04-29 NOTE — Patient Instructions (Signed)
Visit Information  Thank you for taking time to visit with me today. Please don't hesitate to contact me if I can be of assistance to you.   Following are the goals we discussed today:   Goals Addressed             This Visit's Progress    Supportive Resources   On track    Activities and task to complete in order to accomplish goals.   Keep all upcoming appointment discussed today Continue with compliance of taking medication prescribed by Doctor Continue implementing self-care strategies Provide PCP office with claim form to initiate aid services after upcoming surgery              Our next appointment is by telephone on 06/02/22 at 10 AM  Please call the care guide team at [phone] if you need to cancel or reschedule your appointment.   If you are experiencing a Mental Health or [place] or need someone to talk to, please call the Suicide and Crisis Lifeline: 988 call 911   Patient verbalizes understanding of instructions and care plan provided today and agrees to view in [place]. Active MyChart status and patient understanding of how to access instructions and care plan via MyChart confirmed with patient.     Christa See, MSW, [email or web address] Phone [phone] 4:35 PM

## 2022-04-30 ENCOUNTER — Encounter: Payer: Self-pay | Admitting: Internal Medicine

## 2022-04-30 ENCOUNTER — Ambulatory Visit (INDEPENDENT_AMBULATORY_CARE_PROVIDER_SITE_OTHER): Payer: Medicare HMO | Admitting: Internal Medicine

## 2022-04-30 VITALS — BP 138/84 | Temp 97.9°F | Ht 65.0 in | Wt 212.6 lb

## 2022-04-30 DIAGNOSIS — M1A3710 Chronic gout due to renal impairment, right ankle and foot, without tophus (tophi): Secondary | ICD-10-CM | POA: Diagnosis not present

## 2022-04-30 DIAGNOSIS — Z6835 Body mass index (BMI) 35.0-35.9, adult: Secondary | ICD-10-CM

## 2022-04-30 DIAGNOSIS — N1831 Chronic kidney disease, stage 3a: Secondary | ICD-10-CM

## 2022-04-30 DIAGNOSIS — E039 Hypothyroidism, unspecified: Secondary | ICD-10-CM | POA: Diagnosis not present

## 2022-04-30 DIAGNOSIS — I129 Hypertensive chronic kidney disease with stage 1 through stage 4 chronic kidney disease, or unspecified chronic kidney disease: Secondary | ICD-10-CM | POA: Diagnosis not present

## 2022-04-30 DIAGNOSIS — M10071 Idiopathic gout, right ankle and foot: Secondary | ICD-10-CM

## 2022-04-30 DIAGNOSIS — E1122 Type 2 diabetes mellitus with diabetic chronic kidney disease: Secondary | ICD-10-CM

## 2022-05-01 LAB — TSH+FREE T4
Free T4: 1.52 ng/dL (ref 0.82–1.77)
TSH: 2.45 u[IU]/mL (ref 0.450–4.500)

## 2022-05-01 LAB — BMP8+EGFR
BUN/Creatinine Ratio: 19 (ref 12–28)
BUN: 19 mg/dL (ref 8–27)
CO2: 22 mmol/L (ref 20–29)
Calcium: 9.2 mg/dL (ref 8.7–10.3)
Chloride: 110 mmol/L — ABNORMAL HIGH (ref 96–106)
Creatinine, Ser: 1.01 mg/dL — ABNORMAL HIGH (ref 0.57–1.00)
Glucose: 69 mg/dL — ABNORMAL LOW (ref 70–99)
Potassium: 3.9 mmol/L (ref 3.5–5.2)
Sodium: 145 mmol/L — ABNORMAL HIGH (ref 134–144)
eGFR: 57 mL/min/{1.73_m2} — ABNORMAL LOW (ref >59)

## 2022-05-01 LAB — URIC ACID: Uric Acid: 5.8 mg/dL (ref 3.1–7.9)

## 2022-05-05 ENCOUNTER — Encounter: Payer: Self-pay | Admitting: Addiction (Substance Use Disorder)

## 2022-05-05 ENCOUNTER — Other Ambulatory Visit: Payer: Self-pay | Admitting: Internal Medicine

## 2022-05-05 ENCOUNTER — Telehealth: Payer: Self-pay | Admitting: Licensed Clinical Social Worker

## 2022-05-05 NOTE — Patient Instructions (Signed)
Visit Information  Thank you for taking time to visit with me today. Please don't hesitate to contact me if I can be of assistance to you.   Following are the goals we discussed today:   Goals Addressed             This Visit's Progress    Supportive Resources   On track    Activities and task to complete in order to accomplish goals.   Keep all upcoming appointment discussed today Continue with compliance of taking medication prescribed by Doctor [place] to obtain clarity regarding eligibility for compensation benefits for aid Continue to exercise at [facility] to promote well-being Continue utilizing [facility], as needed for transportation services Continue to implement healthy coping skills discussed to promote health and well-being              Our next appointment is by telephone on 4/8 at 10 AM  Please call the care guide team at [phone] if you need to cancel or reschedule your appointment.   If you are experiencing a Mental Health or [place] or need someone to talk to, please call the Suicide and Crisis Lifeline: 988 call 911   Patient verbalizes understanding of instructions and care plan provided today and agrees to view in [place]. Active MyChart status and patient understanding of how to access instructions and care plan via MyChart confirmed with patient.     Christa See, MSW, [email or web address] Phone [phone] 4:00 PM
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
## 2022-05-12 ENCOUNTER — Ambulatory Visit (INDEPENDENT_AMBULATORY_CARE_PROVIDER_SITE_OTHER): Payer: Medicare HMO | Admitting: Psychiatry

## 2022-05-12 DIAGNOSIS — Z630 Problems in relationship with spouse or partner: Secondary | ICD-10-CM

## 2022-05-12 DIAGNOSIS — F4323 Adjustment disorder with mixed anxiety and depressed mood: Secondary | ICD-10-CM | POA: Diagnosis not present

## 2022-05-12 DIAGNOSIS — R69 Illness, unspecified: Secondary | ICD-10-CM | POA: Diagnosis not present

## 2022-05-12 NOTE — Progress Notes (Signed)
Psychotherapy Progress Note [facility] Luan Moore, PhD LP  [name] [facility] "[name]")    MRN: [id number] Therapy format: Individual psychotherapy Date: 05/12/2022      Start: 9:15a     Stop: 10:15a     Time Spent: 60 min Location: In-person   Session narrative (presenting needs, interim history, self-report of stressors and symptoms, applications of prior therapy, status changes, and interventions made in session) Vague memory for homework.  Did get out on a date with a different man Eddie Dibbles last week, believes he's single, twice divorced, BSA leader, active in the church.  He calls daily, and they have watched things together at her house.  Hx of her cousin taking confidences and appointing herself to accost him, which irritated him enough to not return [name]'s calls for a while, until recently.  Frequent contact, he's 10 years younger and in [place] services.  Used to think he was a little stuck up, but finding out otherwise.  Also ran into a fellow poll worker at [employer] when they went out last week, befriended and exchanged phone numbers.  The other man she was dating Cathleen Corti has been occupied with daughter who had a stroke, in her 72s, in the heat of strife with a boyfriend.  Recently began chasing after [name], telling her he loves her, but he's still involved with somebody else.  Has set limits on sex now, after 3 years being his "secret", and feels clear she's not going back to accepting the role.  Realizes she has labored under shame as family of a mentally ill mother, who cheated on father, even had her criminal BF pose as H to take out a 2nd mortgage in [employer].  Glad to have better company and self-respect these days.    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  Goes to an old church, "mostly Caucasian" but  diverse, with particular ethnic outreaches and partnerships.  Wed Bible study is serving in part as a small group, helpful.  Has been friction with one of the pastors, Sid, who seems to have developed a habit now and then of putting her down in public.  Recently took up biblical advice to go directly, early indication she got him to take feedback.  Senior pastor validated that he gets abrasive sometimes with other people.    Encouraged, overall, in making more "healthy friends" through church and service, etc.  Looking ahead, would like to keep repairing self-esteem.  Framed how the choices she is making speak well the unconditional worth she has learned she has, under God.  Will be having wrist surgery in 2 weeks but figures to still be good enough to travel for scheduled appts.  Informed about fair notice to cancel or change to telehealth appt.  Will have an upright walker for recovery reasons, but suggested she may find she likes it better anyway, as it will align her back better.  In closing, confesses she was intimate with Eddie Dibbles for the first time.  Figures she should be fully honest.  In extended session, respect offered, and validated that it is still clear she is not selling herself short, 2 older adults have much more right than the rest of [place] to choose, and no matter what happens, it is not a matter of getting used but of exercising adult  consent.  Validated she can trust her instincts, say no at discretion, be in charge of her yesses to any relationship or encounter, and also has the unfettered right to check out a relationship further before deciding how far to go with it.  Therapeutic modalities: Cognitive Behavioral Therapy, Solution-Oriented/Positive Psychology, Customer service manager, and Faith-sensitive  Mental Status/Observations:  Appearance:   Casual and Neat     Behavior:  Appropriate  Motor:  Rollator dependent, tends to stoop  Speech/Language:   Clear and Coherent  Affect:  Appropriate   Mood:  normal  Thought process:  normal  Thought content:    WNL  Sensory/Perceptual disturbances:    WNL  Orientation:  Fully oriented  Attention:  Good    Concentration:  Good  Memory:  WNL  Insight:    Good  Judgment:   Good  Impulse Control:  Good   Risk Assessment: Danger to Self: No Self-injurious Behavior: No Danger to Others: No Physical Aggression / Violence: No Duty to Warn: No Access to Firearms a concern: No  Assessment of progress:  progressing well  Diagnosis:   ICD-10-CM   1. Adjustment disorder with mixed anxiety and depressed mood  F43.23     2. Relationship problem between partners  Z63.0     3. r/o MDD-R, Dysthymia-early, GAD, hx PTSD  R69      Plan:  Continue involvement in church and other settings, discovering good people she can be friends with Support new relationship, with appropriate discretion, emphasizing staying honest and consenting Continue to practice self-esteem grounded in godly approval and full authority to decide what to participate in and what not Other recommendations/advice as may be noted above Continue to utilize previously learned skills ad lib Maintain medication as prescribed and work faithfully with relevant prescriber(s) if any changes are desired or seem indicated Call the clinic on-call service, 988/hotline, 911, or present to [facility] or ER if any life-threatening psychiatric crisis Return for time as already scheduled. Already scheduled visit in this office 05/26/2022.  Blanchie Serve, PhD Luan Moore, PhD LP Clinical Psychologist, [facility] [address], [place], [place] [address] [phone]

## 2022-05-13 DIAGNOSIS — E1142 Type 2 diabetes mellitus with diabetic polyneuropathy: Secondary | ICD-10-CM | POA: Insufficient documentation

## 2022-05-13 DIAGNOSIS — G619 Inflammatory polyneuropathy, unspecified: Secondary | ICD-10-CM | POA: Insufficient documentation

## 2022-05-13 DIAGNOSIS — N08 Glomerular disorders in diseases classified elsewhere: Secondary | ICD-10-CM | POA: Insufficient documentation

## 2022-05-13 DIAGNOSIS — N1832 Chronic kidney disease, stage 3b: Secondary | ICD-10-CM | POA: Insufficient documentation

## 2022-05-14 ENCOUNTER — Encounter: Payer: Self-pay | Admitting: Nurse Practitioner

## 2022-05-14 ENCOUNTER — Ambulatory Visit (INDEPENDENT_AMBULATORY_CARE_PROVIDER_SITE_OTHER): Payer: Medicare HMO | Admitting: Nurse Practitioner

## 2022-05-14 VITALS — BP 152/62 | HR 62 | Temp 98.6°F | Ht 65.0 in | Wt 206.0 lb

## 2022-05-14 DIAGNOSIS — E6609 Other obesity due to excess calories: Secondary | ICD-10-CM

## 2022-05-14 DIAGNOSIS — R197 Diarrhea, unspecified: Secondary | ICD-10-CM

## 2022-05-14 DIAGNOSIS — N1832 Chronic kidney disease, stage 3b: Secondary | ICD-10-CM | POA: Diagnosis not present

## 2022-05-14 DIAGNOSIS — E1165 Type 2 diabetes mellitus with hyperglycemia: Secondary | ICD-10-CM | POA: Diagnosis not present

## 2022-05-14 DIAGNOSIS — Z794 Long term (current) use of insulin: Secondary | ICD-10-CM

## 2022-05-14 DIAGNOSIS — L89312 Pressure ulcer of right buttock, stage 2: Secondary | ICD-10-CM | POA: Insufficient documentation

## 2022-05-14 DIAGNOSIS — E1122 Type 2 diabetes mellitus with diabetic chronic kidney disease: Secondary | ICD-10-CM | POA: Diagnosis not present

## 2022-05-14 DIAGNOSIS — Z6834 Body mass index (BMI) 34.0-34.9, adult: Secondary | ICD-10-CM

## 2022-05-14 NOTE — Progress Notes (Signed)
I,Sheena H Holbrook,acting as a Education administrator for Minette Brine, FNP.,have documented all relevant documentation on the behalf of Minette Brine, FNP,as directed by  Minette Brine, FNP while in the presence of Minette Brine, [place].    Subjective:     [name] , female    DOB: [birth year] , 77 y.o.   MRN: [id number]   Chief Complaint  Patient presents with   Diarrhea    HPI  Patient presents today for decreased appetite since Sunday; diarrhea started Monday; reports bowel movements appear "pasty". She is not able to control her BMs and has had several accidents. She took  Kaopectate, this turned her stools very dark (which the bottle says can be a side effect). She denies nausea/emesis or abdominal pain. She has been following a bland diet but has not really been hungry, She reports chills but no fever. She does have a history of gastric sleeve surgery. She is feeling better today, was drinking gatorade zero and would have diarrhea afterwards. Her stool color has improved. She had some lasagna at her cousins house prior to her having diarrhea. She did see some bright red blood  She also scheduled for hand surgery on 3/29. She is going to have local anesthesia.    Diarrhea  This is a new problem. The current episode started in the past 7 days. The problem occurs 2 to 4 times per day. Associated symptoms include chills. Pertinent negatives include no abdominal pain, bloating or vomiting. She has tried electrolyte solution for the symptoms.     Past Medical History:  Diagnosis Date   Arthritis    Chronic kidney disease    self reports ckd stage 3    Depression    Diabetes mellitus, type II, insulin dependent ([place])    With neurologic complications. Bilateral lower extremity peripheral neuropathy   Dyspnea    with excertion; no issues now since weight loss surgery    Endometriosis    Essential hypertension    GERD (gastroesophageal reflux disease)    Hepatitis C    C dormant;  states she is in remission since taking Harvoni    Hypertensive nephropathy 07/05/2018   Hypothyroidism    Hypothyroidism 02/06/2018   Morbid obesity with BMI of 50.0-59.9, adult ([place])    Scoliosis      Family History  Problem Relation Age of Onset   Heart attack Mother    Heart failure Mother    Stroke Father      Current Outpatient Medications:    allopurinol (ZYLOPRIM) 100 MG tablet, Take 1 tablet (100 mg total) by mouth daily., Disp: 30 tablet, Rfl: 2   amLODipine (NORVASC) 5 MG tablet, Take 1 tablet by mouth once daily, Disp: 90 tablet, Rfl: 0   AZO-CRANBERRY PO, Take 1 tablet by mouth daily., Disp: , Rfl:    B Complex-C (B-COMPLEX WITH VITAMIN C) tablet, Take 1 tablet by mouth 3 (three) times a week., Disp: , Rfl:    Biotin 10000 MCG TABS, Take 10,000 mcg by mouth daily., Disp: , Rfl:    cetirizine (ZYRTEC) 10 MG tablet, Take 10 mg by mouth at bedtime., Disp: , Rfl:    Cholecalciferol 5000 units TABS, Take 5,000 Units by mouth daily., Disp: , Rfl:    diclofenac Sodium (VOLTAREN) 1 % GEL, APPLY 2 GRAMS TO AFFECTED AREA(S) 4 TIMES DAILY AS NEEDED (Patient taking differently: Apply 2-4 g topically every 6 (six) hours as needed (to affected areas).), Disp: 200 g, Rfl: 2  gabapentin (NEURONTIN) 300 MG capsule, Take 600 mg by mouth at bedtime., Disp: , Rfl:    glucose blood (ONETOUCH VERIO) test strip, USE TO CHECK BLOOD SUGAR   TWO TIMES A DAY AS         INSTRUCTED, Disp: 200 strip, Rfl: 3   Insulin Degludec (TRESIBA) 100 UNIT/ML SOLN, Inject 8 Units/oz/day into the skin. Patient is taking at bedtime, Disp: , Rfl:    Lancets (ONETOUCH DELICA PLUS 123XX123) MISC, USE TO CHECK BLOOD SUGARS  TWO TIMES A DAY AS         INSTRUCTED, Disp: 200 each, Rfl: 3   levothyroxine (SYNTHROID) 25 MCG tablet, Take 1/2 tablet daily., Disp: 90 tablet, Rfl: 1   losartan (COZAAR) 100 MG tablet, Take 1 tablet (100 mg total) by mouth daily., Disp: 90 tablet, Rfl: 2   metoprolol succinate (TOPROL-XL) 25 MG  24 hr tablet, TAKE 1 TABLET AT BEDTIME   (DISCONTINUE COREG), Disp: 90 tablet, Rfl: 1   Multiple Vitamins-Minerals (MULTIVITAMIN WITH MINERALS) tablet, Take 1 tablet by mouth daily. Women's 50+, Disp: , Rfl:    naproxen sodium (ALEVE) 220 MG tablet, Take 220 mg by mouth at bedtime., Disp: , Rfl:    pantoprazole (PROTONIX) 40 MG tablet, Take 40 mg by mouth daily as needed., Disp: , Rfl:    Semaglutide, 1 MG/DOSE, (OZEMPIC, 1 MG/DOSE,) 4 MG/3ML SOPN, Inject 1 mg into the skin once a week., Disp: 9 mL, Rfl: 1   simvastatin (ZOCOR) 10 MG tablet, Take 1 tablet by mouth once daily, Disp: 90 tablet, Rfl: 0   colchicine 0.6 MG tablet, Take 1 tablet by mouth for gout attack. (Patient not taking: Reported on 03/03/2022), Disp: 30 tablet, Rfl: 1   Allergies  Allergen Reactions   Meloxicam Other (See Comments)    Fever; muscle aches; "flu-like" symptoms "Allergic," per MAR   Other Other (See Comments)    "Rose fever and hay fever"     Review of Systems  Constitutional:  Positive for appetite change and chills.  Gastrointestinal:  Positive for diarrhea. Negative for abdominal pain, bloating and vomiting.  All other systems reviewed and are negative.    Today's Vitals   05/14/22 1111  BP: (!) 152/62  Pulse: 62  Temp: 98.6 F (37 C)  TempSrc: Oral  SpO2: 95%  Weight: 206 lb (93.4 kg)  Height: 5\' 5"  (1.651 m)   Body mass index is 34.28 kg/m.   Objective:  Physical Exam Vitals reviewed.  Constitutional:      General: She is not in acute distress.    Appearance: Normal appearance. She is well-developed. She is obese.  HENT:     Head: Normocephalic and atraumatic.  Eyes:     Pupils: Pupils are equal, round, and reactive to light.  Cardiovascular:     Rate and Rhythm: Normal rate and regular rhythm.     Pulses: Normal pulses.     Heart sounds: Normal heart sounds. No murmur heard. Pulmonary:     Effort: Pulmonary effort is normal.     Breath sounds: Normal breath sounds.  Abdominal:      General: Abdomen is flat. Bowel sounds are normal. There is no distension.     Palpations: Abdomen is soft.     Tenderness: There is no abdominal tenderness.  Musculoskeletal:        General: Normal range of motion.  Skin:    General: Skin is warm and dry.     Capillary Refill: Capillary refill takes less  than 2 seconds.  Neurological:     General: No focal deficit present.     Mental Status: She is alert and oriented to person, place, and time.     Cranial Nerves: No cranial nerve deficit.  Psychiatric:        Mood and Affect: Mood normal.         Assessment And Plan:     1. Diarrhea, unspecified type Comments: Hemocult cards given, she has improved with her diarrhea. Continue with bland diet - Ova and parasite examination - Culture, Stool  2. Chronic kidney disease, stage 3b ([place]) Comments: Encouraged to avoid NSAIDs and stay well hydrated with water.  3. Class 1 obesity due to excess calories with serious comorbidity and body mass index (BMI) of 34.0 to 34.9 in adult Comments: She did do PREP but was not good in groups. Interested in [place]. She is encouraged to strive for BMI less than 30 to decrease cardiac risk. Advised to aim for at least 150 minutes of exercise per week.     Patient was given opportunity to ask questions. Patient verbalized understanding of the plan and was able to repeat key elements of the plan. All questions were answered to their satisfaction.  Minette Brine, FNP   I, Minette Brine, FNP, have reviewed all documentation for this visit. The documentation on 05/14/22 for the exam, diagnosis, procedures, and orders are all accurate and complete.   IF YOU HAVE BEEN REFERRED TO A SPECIALIST, IT MAY TAKE 1-2 WEEKS TO SCHEDULE/PROCESS THE REFERRAL. IF YOU HAVE NOT HEARD FROM US/SPECIALIST IN TWO WEEKS, PLEASE GIVE [place] A CALL AT [phone] X [phone].   THE PATIENT IS ENCOURAGED TO PRACTICE SOCIAL DISTANCING DUE TO THE COVID-19 PANDEMIC.

## 2022-05-15 ENCOUNTER — Telehealth: Payer: Self-pay | Admitting: Licensed Clinical Social Worker

## 2022-05-15 NOTE — Patient Outreach (Signed)
  Care Coordination   Follow Up Visit Note   05/15/2022 Name: Teresa Golden MRN: [id number] DOB: Apr 01, 1945  Teresa Golden is a 77 y.o. year old female who sees Glendale Chard, MD for primary care. I spoke with  [name] by phone today.  What matters to the patients health and wellness today?  Aid Paperwork    Goals Addressed             This Visit's Progress    Supportive Resources   On track    Activities and task to complete in order to accomplish goals.   Keep all upcoming appointment discussed today Continue with compliance of taking medication prescribed by Doctor  to obtain clarity regarding eligibility for compensation benefits for aid Continue to exercise at [facility] to promote well-being Continue utilizing [employer], as needed for transportation services Continue to implement healthy coping skills discussed to promote health and well-being              SDOH assessments and interventions completed:  No     Care Coordination Interventions:  Yes, provided  Interventions Today    Flowsheet Row Most Recent Value  Chronic Disease   Chronic disease during today's visit Hypertension (HTN), Diabetes, Chronic Kidney Disease/End Stage Renal Disease (ESRD)  General Interventions   General Interventions Discussed/Reviewed General Interventions Reviewed  [Pt obtained correct ppwk from insurance company. Plans to submit to specialist for completion prior to scheduled procedure. Pt is aware of who she will assign as aid/agency]  Mental Health Interventions   Mental Health Discussed/Reviewed Coping Strategies  [Stress management]       Follow up plan: Follow up call scheduled for 1-2 weeks    Encounter Outcome:  Pt. Visit Completed   Christa See, MSW, [email or web address] Phone [phone] 4:46 PM

## 2022-05-15 NOTE — Patient Instructions (Signed)
Visit Information  Thank you for taking time to visit with me today. Please don't hesitate to contact me if I can be of assistance to you.   Following are the goals we discussed today:   Goals Addressed             This Visit's Progress    Supportive Resources   On track    Activities and task to complete in order to accomplish goals.   Keep all upcoming appointment discussed today Continue with compliance of taking medication prescribed by Doctor [place] to obtain clarity regarding eligibility for compensation benefits for aid Continue to exercise at [facility] to promote well-being Continue utilizing [employer], as needed for transportation services Continue to implement healthy coping skills discussed to promote health and well-being              Our next appointment is by telephone on 4/8 at 10 AM  Please call the care guide team at [phone] if you need to cancel or reschedule your appointment.   If you are experiencing a Mental Health or [place] or need someone to talk to, please call the Suicide and Crisis Lifeline: 988 call 911   Patient verbalizes understanding of instructions and care plan provided today and agrees to view in [place]. Active MyChart status and patient understanding of how to access instructions and care plan via MyChart confirmed with patient.     Christa See, MSW, [email or web address] Phone [phone] 4:47 PM

## 2022-05-16 ENCOUNTER — Ambulatory Visit: Payer: Self-pay

## 2022-05-16 NOTE — Patient Outreach (Signed)
  Care Coordination   Follow Up Visit Note   05/16/2022 Name: Teresa Golden MRN: [id number] DOB: 08-12-1945  Teresa Golden is a 77 y.o. year old female who sees Glendale Chard, MD for primary care. I spoke with  [name] by phone today.  What matters to the patients health and wellness today?  Patient will discuss her options for treatment of Carpal Tunnel with her surgeon. She will adhere to a BRAT diet for treatment of diarrhea.     Goals Addressed               This Visit's Progress     Patient Stated     I am having worsening numbness and tingling in my fingers (pt-stated)        Care Coordination Interventions: Received inbound call from patient with questions and concerns related to carpal tunnel surgery Determined patient feels she is not a candidate for this procedure due having balance concerns and no caregiver assistance at home Answered patients questions regarding skilled nursing needs following carpal tunnel surgery Determined patient feels she will consider conservative treatment at this time Discussed patient plans to contact her surgeon to discuss other options for treatment in order to postpone surgery       Other     To overcome diarrhea without complications        Care Coordination Interventions: Evaluation of current treatment plan related to diarrhea and patient's adherence to plan as established by provider Discussed and reviewed with patient PCP recommendations for management of diarrhea  Educated patient regarding the BRAT diet and encouraged patient to stay well hydrated with water and Pedialyte Instructed patient not to resume her regular diet until her symptoms of diarrhea resolve and to notify her doctor of new or worsening symptoms  Provided patient with the [facility] 24/7 after hours nurse line    Interventions Today    Flowsheet Row Most Recent Value  Chronic Disease   Chronic disease during today's visit Other  [diarrhea, carpal tunnel]   General Interventions   General Interventions Discussed/Reviewed General Interventions Discussed, General Interventions Reviewed, Doctor Visits  Doctor Visits Discussed/Reviewed Doctor Visits Discussed, Doctor Visits Reviewed, PCP  Education Interventions   Education Provided Provided Education  Provided Verbal Education On Nutrition, When to see the doctor  Mental Health Interventions   Mental Health Discussed/Reviewed Anxiety, Mental Health Discussed  Nutrition Interventions   Nutrition Discussed/Reviewed Nutrition Discussed, Nutrition Reviewed          SDOH assessments and interventions completed:  No     Care Coordination Interventions:  Yes, provided   Follow up plan: Follow up call scheduled for 05/26/22 @1  PM     Encounter Outcome:  Pt. Visit Completed

## 2022-05-16 NOTE — Patient Instructions (Signed)
Visit Information  Thank you for taking time to visit with me today. Please don't hesitate to contact me if I can be of assistance to you.   Following are the goals we discussed today:   Goals Addressed               This Visit's Progress     Patient Stated     I am having worsening numbness and tingling in my fingers (pt-stated)        Care Coordination Interventions: Received inbound call from patient with questions and concerns related to carpal tunnel surgery Determined patient feels she is not a candidate for this procedure due having balance concerns and no caregiver assistance at home Answered patients questions regarding skilled nursing needs following carpal tunnel surgery Determined patient feels she will consider conservative treatment at this time Discussed patient plans to contact her surgeon to discuss other options for treatment in order to postpone surgery       Other     To overcome diarrhea without complications        Care Coordination Interventions: Evaluation of current treatment plan related to diarrhea and patient's adherence to plan as established by provider Discussed and reviewed with patient PCP recommendations for management of diarrhea  Educated patient regarding the BRAT diet and encouraged patient to stay well hydrated with water and Pedialyte Instructed patient not to resume her regular diet until her symptoms of diarrhea resolve and to notify her doctor of new or worsening symptoms  Provided patient with the [facility] 24/7 after hours nurse line           Our next appointment is by telephone on 05/26/22 at 1:00 PM  Please call the care guide team at [phone] if you need to cancel or reschedule your appointment.   If you are experiencing a Mental Health or [place] or need someone to talk to, please call 1-800-273-TALK (toll free, 24 hour hotline) go to [facility] [address] [place]  [phone])  Patient verbalizes understanding of instructions and care plan provided today and agrees to view in [place]. Active MyChart status and patient understanding of how to access instructions and care plan via MyChart confirmed with patient.     Barb Merino, RN, BSN, CCM Care Management Coordinator [facility] Care Management Direct Phone: [phone]

## 2022-05-20 ENCOUNTER — Ambulatory Visit: Payer: Medicare HMO | Admitting: Internal Medicine

## 2022-05-26 ENCOUNTER — Ambulatory Visit: Payer: Self-pay

## 2022-05-26 ENCOUNTER — Ambulatory Visit (INDEPENDENT_AMBULATORY_CARE_PROVIDER_SITE_OTHER): Payer: Medicare HMO | Admitting: Psychiatry

## 2022-05-26 DIAGNOSIS — R69 Illness, unspecified: Secondary | ICD-10-CM

## 2022-05-26 DIAGNOSIS — F4323 Adjustment disorder with mixed anxiety and depressed mood: Secondary | ICD-10-CM | POA: Diagnosis not present

## 2022-05-26 DIAGNOSIS — Z630 Problems in relationship with spouse or partner: Secondary | ICD-10-CM
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
## 2022-05-26 NOTE — Patient Outreach (Signed)
  Care Coordination   05/26/2022 Name: Teresa Golden MRN: [id number] DOB: [birth year]   Care Coordination Outreach Attempts:  An unsuccessful telephone outreach was attempted for a scheduled appointment today.  Follow Up Plan:  Additional outreach attempts will be made to offer the patient care coordination information and services.   Encounter Outcome:  No Answer   Care Coordination Interventions:  No, not indicated    Barb Merino, RN, BSN, CCM Care Management Coordinator [facility] Care Management  Direct Phone: [phone]

## 2022-05-30 ENCOUNTER — Telehealth: Payer: Self-pay | Admitting: Licensed Clinical Social Worker

## 2022-06-02 ENCOUNTER — Ambulatory Visit: Payer: Self-pay | Admitting: Licensed Clinical Social Worker

## 2022-06-02 ENCOUNTER — Other Ambulatory Visit: Payer: Self-pay | Admitting: Internal Medicine

## 2022-06-02 NOTE — Patient Instructions (Signed)
Visit Information  Thank you for taking time to visit with me today. Please don't hesitate to contact me if I can be of assistance to you.   Following are the goals we discussed today:   Goals Addressed             This Visit's Progress    Supportive Resources   On track    Activities and task to complete in order to accomplish goals.   Keep all upcoming appointment discussed today Continue with compliance of taking medication prescribed by Doctor Continue to exercise at [facility] to promote well-being Continue utilizing [employer], as needed for transportation services, in addition, to other local resources discussed Continue to implement healthy coping skills discussed to promote health and well-being              Our next appointment is by telephone on 4/8   Please call the care guide team at [phone] if you need to cancel or reschedule your appointment.   If you are experiencing a Mental Health or Behavioral Health Crisis or need someone to talk to, please call the Suicide and Crisis Lifeline: 988 call 911   Patient verbalizes understanding of instructions and care plan provided today and agrees to view in MyChart. Active MyChart status and patient understanding of how to access instructions and care plan via MyChart confirmed with patient.     Jenel Lucks, MSW, LCSW [facility] Care Management [place]  [facility] [email or web address] Phone [phone] 3:19 PM

## 2022-06-03 NOTE — Patient Instructions (Signed)
Visit Information  Thank you for taking time to visit with me today. Please don't hesitate to contact me if I can be of assistance to you.   Following are the goals we discussed today:   Goals Addressed             This Visit's Progress    Supportive Resources   On track    Activities and task to complete in order to accomplish goals.   Keep all upcoming appointment discussed today Continue with compliance of taking medication prescribed by Doctor Continue to exercise at [facility] to promote well-being Continue utilizing [employer], as needed for transportation services, in addition, to other local resources discussed Continue to implement healthy coping skills discussed to promote health and well-being              Our next appointment is by telephone on 5/7 at 10 AM  Please call the care guide team at [phone] if you need to cancel or reschedule your appointment.   If you are experiencing a Mental Health or Behavioral Health Crisis or need someone to talk to, please call the Suicide and Crisis Lifeline: 988 call 911   Patient verbalizes understanding of instructions and care plan provided today and agrees to view in MyChart. Active MyChart status and patient understanding of how to access instructions and care plan via MyChart confirmed with patient.     Jenel Lucks, MSW, LCSW [facility] Care Management [place]  [facility] [email or web address] Phone [phone] 1:53 PM

## 2022-06-03 NOTE — Patient Outreach (Signed)
  Care Coordination   Follow Up Visit Note   06/02/22 Name: Teresa Golden MRN: [id number] DOB: [birth year]  Teresa Golden is a 77 y.o. year old female who sees Dorothyann Peng, MD for primary care. I spoke with  Margrett Rud by phone today.  What matters to the patients health and wellness today?  Supportive Resources    Goals Addressed             This Visit's Progress    Supportive Resources   On track    Activities and task to complete in order to accomplish goals.   Keep all upcoming appointment discussed today Continue with compliance of taking medication prescribed by Doctor Continue to exercise at [facility] to promote well-being Continue utilizing [employer], as needed for transportation services, in addition, to other local resources discussed Continue to implement healthy coping skills discussed to promote health and well-being              SDOH assessments and interventions completed:  No     Care Coordination Interventions:  Yes, provided  Interventions Today    Flowsheet Row Most Recent Value  Chronic Disease   Chronic disease during today's visit Hypertension (HTN), Diabetes, Chronic Kidney Disease/End Stage Renal Disease (ESRD)  General Interventions   General Interventions Discussed/Reviewed [employer] plans to [employer] additional resources from [employer] of the [employer]. Discussed how to access schedule. LCSW will email supportive resources]       Follow up plan: Follow up call scheduled for 2-4 weeks    Encounter Outcome:  Pt. Visit Completed   Jenel Lucks, MSW, LCSW [facility] Care Management [facility]  [facility] [email or web address] Phone [phone] 1:51 PM

## 2022-06-10 ENCOUNTER — Ambulatory Visit (INDEPENDENT_AMBULATORY_CARE_PROVIDER_SITE_OTHER): Payer: Medicare HMO | Admitting: Psychiatry

## 2022-06-10 DIAGNOSIS — Z630 Problems in relationship with spouse or partner: Secondary | ICD-10-CM

## 2022-06-10 DIAGNOSIS — F4323 Adjustment disorder with mixed anxiety and depressed mood: Secondary | ICD-10-CM | POA: Diagnosis not present

## 2022-06-10 DIAGNOSIS — R69 Illness, unspecified: Secondary | ICD-10-CM | POA: Diagnosis not present
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
## 2022-06-12 ENCOUNTER — Ambulatory Visit: Payer: Self-pay

## 2022-06-12 NOTE — Patient Instructions (Signed)
Visit Information  Thank you for taking time to visit with me today. Please don't hesitate to contact me if I can be of assistance to you.   Following are the goals we discussed today:   Goals Addressed               This Visit's Progress     Patient Stated     COMPLETED: I am having worsening numbness and tingling in my fingers (pt-stated)        Care Coordination Interventions: Received inbound call from patient with questions and concerns related to carpal tunnel surgery Reviewed and discussed with patient her f/u with Dr. Amanda Pea regarding her carpal tunnel syndrome  Determined patient has decided not to pursue surgery at this time, she received a Cortisol injection with very good effectiveness Determined patient's pain has subsided and she has regained baseline dexterity since receiving the injection Encouraged patient to continue to follow MD recommendations by wearing the nighttime hand splints and to keep her doctor well informed of new or worsening symptoms promptly       Other     I want to work on building my muscle mass        Care Coordination Interventions: Evaluation of current treatment plan related to physical health and patient's adherence to plan as established by provider Discussed with patient she is ready to be more active and establish a routine exercise regimen Discussed patient's plans to attend the [facility] near her home this weekend in order to get started  Positive reinforcement provided to patient for making healthy choices and lifestyle changes in order to stay healthy      To overcome diarrhea without complications        Care Coordination Interventions: Evaluation of current treatment plan related to diarrhea and patient's adherence to plan as established by provider Determined patient continues to have intermittent diarrhea with constipation  Discussed patient is concerned this may be related to her past gastric sleeve, she opted to schedule a follow up  with Dr. Loreta Ave, Gastroenterologist for further evaluation  Positive reinforcement given to patient for being proactive with her health  Encouraged patient to ask Dr. Loreta Ave about potential need for follow up on vitamin deficiencies due to past GI sleeve and recent GI symptoms         Our next appointment is by telephone on 08/12/22 at 09:30 AM  Please call the care guide team at [phone] if you need to cancel or reschedule your appointment.   If you are experiencing a Mental Health or Behavioral Health Crisis or need someone to talk to, please call 1-800-273-TALK (toll free, 24 hour hotline) go to [facility] [address], [place] [phone])  Patient verbalizes understanding of instructions and care plan provided today and agrees to view in MyChart. Active MyChart status and patient understanding of how to access instructions and care plan via MyChart confirmed with patient.     Delsa Sale, RN, BSN, CCM Care Management Coordinator [facility] Care Management  Direct Phone: [phone]

## 2022-06-12 NOTE — Patient Outreach (Signed)
  Care Coordination   Follow Up Visit Note   06/12/2022 Name: KALESHA IRVING MRN: [id number] DOB: 03/30/1945  SHUKRI NISTLER is a 77 y.o. year old female who sees Dorothyann Peng, MD for primary care. I spoke with  Margrett Rud by phone today.  What matters to the patients health and wellness today?  Patient would like to establish a routine exercise regimen at the [facility]. She will f/u with Dr. Loreta Ave to evaluate her GI symptoms.     Goals Addressed               This Visit's Progress     Patient Stated     COMPLETED: I am having worsening numbness and tingling in my fingers (pt-stated)        Care Coordination Interventions: Received inbound call from patient with questions and concerns related to carpal tunnel surgery Reviewed and discussed with patient her f/u with Dr. Amanda Pea regarding her carpal tunnel syndrome  Determined patient has decided not to pursue surgery at this time, she received a Cortisol injection with very good effectiveness Determined patient's pain has subsided and she has regained baseline dexterity since receiving the injection Encouraged patient to continue to follow MD recommendations by wearing the nighttime hand splints and to keep her doctor well informed of new or worsening symptoms promptly       Other     I want to work on building my muscle mass        Care Coordination Interventions: Evaluation of current treatment plan related to physical health and patient's adherence to plan as established by provider Discussed with patient she is ready to be more active and establish a routine exercise regimen Discussed patient's plans to attend the [facility] near her home this weekend in order to get started  Positive reinforcement provided to patient for making healthy choices and lifestyle changes in order to stay healthy      To overcome diarrhea without complications        Care Coordination Interventions: Evaluation of current treatment plan related to diarrhea  and patient's adherence to plan as established by provider Determined patient continues to have intermittent diarrhea with constipation  Discussed patient is concerned this may be related to her past gastric sleeve, she opted to schedule a follow up with Dr. Loreta Ave, Gastroenterologist for further evaluation  Positive reinforcement given to patient for being proactive with her health  Encouraged patient to ask Dr. Loreta Ave about potential need for follow up on vitamin deficiencies due to past GI sleeve and recent GI symptoms     Interventions Today    Flowsheet Row Most Recent Value  Chronic Disease   Chronic disease during today's visit Other  [carpal tunnel]  General Interventions   General Interventions Discussed/Reviewed General Interventions Discussed, General Interventions Reviewed, Doctor Visits  Doctor Visits Discussed/Reviewed Specialist, Doctor Visits Reviewed, Doctor Visits Discussed  Exercise Interventions   Exercise Discussed/Reviewed Exercise Discussed, Exercise Reviewed, Physical Activity  Physical Activity Discussed/Reviewed Gym, Physical Activity Reviewed, Physical Activity Discussed  Education Interventions   Education Provided Provided Education  Provided Verbal Education On When to see the doctor, Exercise          SDOH assessments and interventions completed:  No     Care Coordination Interventions:  Yes, provided   Follow up plan: Follow up call scheduled for 08/12/22 :30 AM    Encounter Outcome:  Pt. Visit Completed

## 2022-06-13 ENCOUNTER — Other Ambulatory Visit: Payer: Self-pay | Admitting: Internal Medicine

## 2022-06-23 ENCOUNTER — Encounter: Payer: Self-pay | Admitting: Internal Medicine

## 2022-06-23 ENCOUNTER — Ambulatory Visit (INDEPENDENT_AMBULATORY_CARE_PROVIDER_SITE_OTHER): Payer: Medicare HMO | Admitting: Internal Medicine

## 2022-06-23 VITALS — BP 132/78 | HR 63 | Temp 98.1°F | Ht 65.0 in | Wt 208.2 lb

## 2022-06-23 DIAGNOSIS — H6122 Impacted cerumen, left ear: Secondary | ICD-10-CM

## 2022-06-23 DIAGNOSIS — Z6834 Body mass index (BMI) 34.0-34.9, adult: Secondary | ICD-10-CM

## 2022-06-23 DIAGNOSIS — Z0000 Encounter for general adult medical examination without abnormal findings: Secondary | ICD-10-CM | POA: Diagnosis not present

## 2022-06-23 DIAGNOSIS — N1831 Chronic kidney disease, stage 3a: Secondary | ICD-10-CM

## 2022-06-23 DIAGNOSIS — I129 Hypertensive chronic kidney disease with stage 1 through stage 4 chronic kidney disease, or unspecified chronic kidney disease: Secondary | ICD-10-CM

## 2022-06-23 DIAGNOSIS — K7460 Unspecified cirrhosis of liver: Secondary | ICD-10-CM

## 2022-06-23 DIAGNOSIS — I82552 Chronic embolism and thrombosis of left peroneal vein: Secondary | ICD-10-CM

## 2022-06-23 DIAGNOSIS — Z794 Long term (current) use of insulin: Secondary | ICD-10-CM

## 2022-06-23 DIAGNOSIS — E1122 Type 2 diabetes mellitus with diabetic chronic kidney disease: Secondary | ICD-10-CM | POA: Diagnosis not present

## 2022-06-23 DIAGNOSIS — E2839 Other primary ovarian failure: Secondary | ICD-10-CM

## 2022-06-23 DIAGNOSIS — E6609 Other obesity due to excess calories: Secondary | ICD-10-CM

## 2022-06-23 DIAGNOSIS — Z79899 Other long term (current) drug therapy: Secondary | ICD-10-CM

## 2022-06-23 MED ORDER — ALLOPURINOL 100 MG PO TABS: 100.0000 mg | ORAL_TABLET | Freq: Every day | ORAL | 2 refills | Status: DC

## 2022-06-23 MED ORDER — SIMVASTATIN 10 MG PO TABS: 10.0000 mg | ORAL_TABLET | Freq: Every day | ORAL | 0 refills | Status: DC

## 2022-06-23 MED ORDER — AMLODIPINE BESYLATE 5 MG PO TABS: 5.0000 mg | ORAL_TABLET | Freq: Every day | ORAL | 2 refills | Status: DC

## 2022-06-23 NOTE — Progress Notes (Unsigned)
I,Teresa Golden,acting as a scribe for Teresa Aliment, MD.,have documented all relevant documentation on the behalf of Teresa Aliment, MD,as directed by  Teresa Aliment, MD while in the presence of Teresa Aliment, MD.   Subjective:     [name] , female    DOB: 1945-05-27 , 77 y.o.   MRN: [id number]   Chief Complaint  Patient presents with   Annual Exam   Diabetes   Hypertension   Hypothyroidism    HPI  Patient presents today for annual exam. She is no longer followed by Gyn.  She reports compliance with medications. She denies headache, chest pain, SOB & blurred vision.   She has no specific concerns or complaints at this time.   Diabetes She presents for her follow-up diabetic visit. She has type 2 diabetes mellitus. Pertinent negatives for diabetes include no blurred vision. There are no hypoglycemic complications. Risk factors for coronary artery disease include diabetes mellitus, dyslipidemia, hypertension, obesity, sedentary lifestyle and post-menopausal. She is following a diabetic diet. She participates in exercise intermittently. Her home blood glucose trend is fluctuating minimally. Her breakfast blood glucose is taken between 8-9 am. Her breakfast blood glucose range is generally 110-130 mg/dl. An ACE inhibitor/angiotensin II receptor blocker is being taken. Eye exam is current.  Hypertension This is a chronic problem. The current episode started more than 1 year ago. The problem has been gradually improving since onset. The problem is uncontrolled. Pertinent negatives include no blurred vision. Risk factors for coronary artery disease include diabetes mellitus, dyslipidemia, post-menopausal state and sedentary lifestyle. Past treatments include angiotensin blockers and beta blockers. The current treatment provides moderate improvement.     Past Medical History:  Diagnosis Date   Arthritis    Chronic kidney disease    self reports ckd stage 3     Depression    Diabetes mellitus, type II, insulin dependent (HCC)    With neurologic complications. 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    Hypertensive nephropathy 07/05/2018   Hypothyroidism    Hypothyroidism 02/06/2018   Morbid obesity with BMI of 50.0-59.9, adult (HCC)    Scoliosis      Family History  Problem Relation Age of Onset   Heart attack Mother    Heart failure Mother    Stroke Father      Current Outpatient Medications:    AZO-CRANBERRY PO, Take 1 tablet by mouth daily., Disp: , Rfl:    B Complex-C (B-COMPLEX WITH VITAMIN C) tablet, Take 1 tablet by mouth 3 (three) times a week., Disp: , Rfl:    Biotin 40981 MCG TABS, Take 10,000 mcg by mouth daily., Disp: , Rfl:    cetirizine (ZYRTEC) 10 MG tablet, Take 10 mg by mouth at bedtime., Disp: , Rfl:    Cholecalciferol 5000 units TABS, Take 5,000 Units by mouth daily., Disp: , Rfl:    diclofenac Sodium (VOLTAREN) 1 % GEL, APPLY 2 GRAMS TO AFFECTED AREA(S) 4 TIMES DAILY AS NEEDED (Patient taking differently: Apply 2-4 g topically every 6 (six) hours as needed (to affected areas).), Disp: 200 g, Rfl: 2   gabapentin (NEURONTIN) 300 MG capsule, Take 600 mg by mouth at bedtime., Disp: ,  Rfl:    glucose blood (ONETOUCH VERIO) test strip, USE TO CHECK BLOOD SUGAR   TWO TIMES A DAY AS         INSTRUCTED, Disp: 200 strip, Rfl: 3   Insulin Degludec (TRESIBA) 100 UNIT/ML SOLN, Inject 8 Units/oz/day into the skin. Patient is taking at bedtime, Disp: , Rfl:    Lancets (ONETOUCH DELICA PLUS LANCET33G) MISC, USE TO CHECK BLOOD SUGARS  TWO TIMES A DAY AS         INSTRUCTED, Disp: 200 each, Rfl: 3   levothyroxine (SYNTHROID) 25 MCG tablet, Take 1/2 tablet daily., Disp: 90 tablet, Rfl: 1   losartan  (COZAAR) 100 MG tablet, Take 1 tablet (100 mg total) by mouth daily., Disp: 90 tablet, Rfl: 2   metoprolol succinate (TOPROL-XL) 25 MG 24 hr tablet, TAKE 1 TABLET AT BEDTIME   (DISCONTINUE COREG), Disp: 90 tablet, Rfl: 1   Multiple Vitamins-Minerals (MULTIVITAMIN WITH MINERALS) tablet, Take 1 tablet by mouth daily. Women's 50+, Disp: , Rfl:    pantoprazole (PROTONIX) 40 MG tablet, Take 40 mg by mouth daily as needed., Disp: , Rfl:    Semaglutide, 1 MG/DOSE, (OZEMPIC, 1 MG/DOSE,) 4 MG/3ML SOPN, Inject 1 mg into the skin once a week., Disp: 9 mL, Rfl: 1   allopurinol (ZYLOPRIM) 100 MG tablet, Take 1 tablet (100 mg total) by mouth daily., Disp: 90 tablet, Rfl: 2   amLODipine (NORVASC) 5 MG tablet, Take 1 tablet (5 mg total) by mouth daily., Disp: 90 tablet, Rfl: 2   colchicine 0.6 MG tablet, Take 1 tablet by mouth for gout attack. (Patient not taking: Reported on 03/03/2022), Disp: 30 tablet, Rfl: 1   simvastatin (ZOCOR) 10 MG tablet, Take 1 tablet (10 mg total) by mouth daily., Disp: 90 tablet, Rfl: 0   Allergies  Allergen Reactions   Meloxicam Other (See Comments)    Fever; muscle aches; "flu-like" symptoms "Allergic," per MAR   Other Other (See Comments)    "Rose fever and hay fever"      The patient states she uses post menopausal status for birth control. Last LMP was No LMP recorded. Patient has had a hysterectomy.. Negative for Dysmenorrhea. Negative for: breast discharge, breast lump(s), breast pain and breast self exam. Associated symptoms include abnormal vaginal bleeding. Pertinent negatives include abnormal bleeding (hematology), anxiety, decreased libido, depression, difficulty falling sleep, dyspareunia, history of infertility, nocturia, sexual dysfunction, sleep disturbances, urinary incontinence, urinary urgency, vaginal discharge and vaginal itching. Diet regular.The patient states her exercise level is  intermittent.  . The patient's tobacco use is:  Social History   Tobacco Use   Smoking Status Former   Packs/day: 0.25   Years: 10.00   Additional pack years: 0.00   Total pack years: 2.50   Types: Cigarettes  Smokeless Tobacco Never  Tobacco Comments   quit 20 years ago  . She has been exposed to passive smoke. The patient's alcohol use is:  Social History   Substance and Sexual Activity  Alcohol Use Not Currently   Alcohol/week: 1.0 standard drink of alcohol   Types: 1 Glasses of wine per week   Comment: occasional    Review of Systems  Constitutional: Negative.   HENT: Negative.    Eyes: Negative.  Negative for blurred vision.  Respiratory: Negative.    Cardiovascular: Negative.   Gastrointestinal: Negative.   Endocrine: Negative.   Genitourinary: Negative.   Musculoskeletal: Negative.   Skin: Negative.   Allergic/Immunologic: Negative.   Neurological: Negative.   Hematological: Negative.  Psychiatric/Behavioral: Negative.       Today's Vitals   06/23/22 1435  BP: 132/78  Pulse: 63  Temp: 98.1 F (36.7 C)  SpO2: 98%  Weight: 208 lb 3.2 oz (94.4 kg)  Height: 5\' 5"  (1.651 m)   Body mass index is 34.65 kg/m.  Wt Readings from Last 3 Encounters:  06/23/22 208 lb 3.2 oz (94.4 kg)  05/14/22 206 lb (93.4 kg)  04/30/22 212 lb 9.6 oz (96.4 kg)    Objective:  Physical Exam Vitals and nursing note reviewed.  Constitutional:      Appearance: Normal appearance. She is obese.     Comments: Examined while in chair  HENT:     Head: Normocephalic and atraumatic.     Right Ear: Tympanic membrane, ear canal and external ear normal.     Left Ear: Ear canal and external ear normal. There is impacted cerumen.     Nose: Nose normal.     Mouth/Throat:     Mouth: Mucous membranes are moist.     Pharynx: Oropharynx is clear.  Eyes:     Extraocular Movements: Extraocular movements intact.     Conjunctiva/sclera: Conjunctivae normal.     Pupils: Pupils are equal, round, and reactive to light.  Cardiovascular:     Rate and Rhythm: Normal rate  and regular rhythm.     Pulses: Normal pulses.     Heart sounds: Normal heart sounds.  Pulmonary:     Effort: Pulmonary effort is normal.     Breath sounds: Normal breath sounds.  Chest:  Breasts:    Tanner Score is 5.     Right: Normal.     Left: Normal.     Comments: Pendulous  Abdominal:     General: Abdomen is flat. Bowel sounds are normal.     Palpations: Abdomen is soft.  Genitourinary:    Comments: deferred Musculoskeletal:        General: Normal range of motion.     Cervical back: Normal range of motion and neck supple.     Comments: Ambulatory w/ walker  Skin:    General: Skin is warm and dry.  Neurological:     General: No focal deficit present.     Mental Status: She is alert and oriented to person, place, and time.  Psychiatric:        Mood and Affect: Mood normal.        Behavior: Behavior normal.     Assessment And Plan:     1. Encounter for annual physical exam Comments: A full exam was performed. Importance of monthly self breast exams was discussed with the patient. PATIENT IS ADVISED TO GET 30-45 MINUTES REGULAR EXERCISE NO LESS THAN FOUR TO FIVE DAYS PER WEEK - BOTH WEIGHTBEARING EXERCISES AND AEROBIC ARE RECOMMENDED.  PATIENT IS ADVISED TO FOLLOW A HEALTHY DIET WITH AT LEAST SIX FRUITS/VEGGIES PER DAY, DECREASE INTAKE OF RED MEAT, AND TO INCREASE FISH INTAKE TO TWO DAYS PER WEEK.  MEATS/FISH SHOULD NOT BE FRIED, BAKED OR BROILED IS PREFERABLE.  IT IS ALSO IMPORTANT TO CUT BACK ON YOUR SUGAR INTAKE. PLEASE AVOID ANYTHING WITH ADDED SUGAR, CORN SYRUP OR OTHER SWEETENERS. IF YOU MUST USE A SWEETENER, YOU CAN TRY STEVIA. IT IS ALSO IMPORTANT TO AVOID ARTIFICIALLY SWEETENERS AND DIET BEVERAGES. LASTLY, I SUGGEST WEARING SPF 50 SUNSCREEN ON EXPOSED PARTS AND ESPECIALLY WHEN IN THE DIRECT SUNLIGHT FOR AN EXTENDED PERIOD OF TIME.  PLEASE AVOID FAST FOOD RESTAURANTS AND INCREASE YOUR WATER INTAKE.  2. Type 2 diabetes mellitus with stage 3a chronic kidney disease, with  long-term current use of insulin (HCC) Comments: Chronic, i plan to start Jardiance 10mg  after reviewing labs .She is aware she will need to return in four weeks for re-evaluation.  I DISCUSSED WITH THE PATIENT AT LENGTH REGARDING THE GOALS OF GLYCEMIC CONTROL AND POSSIBLE LONG-TERM COMPLICATIONS.  I  ALSO STRESSED THE IMPORTANCE OF COMPLIANCE WITH HOME GLUCOSE MONITORING, DIETARY RESTRICTIONS INCLUDING AVOIDANCE OF SUGARY DRINKS/PROCESSED FOODS,  ALONG WITH REGULAR EXERCISE.  I  ALSO STRESSED THE IMPORTANCE OF ANNUAL EYE EXAMS, SELF FOOT CARE AND COMPLIANCE WITH OFFICE VISITS.  - EKG 12-Lead - CMP14+EGFR - Hemoglobin A1c - Lipid panel - Microalbumin / creatinine urine ratio  3. Hypertensive nephropathy Comments: Chronic, fair control. EKG performed, SB w/ RBBB. Reminded to follow low sodium diet.  She will c/w losartan 100mg , metoprolol 25mg  & amlodipine 5mg  daily. - EKG 12-Lead - amLODipine (NORVASC) 5 MG tablet; Take 1 tablet (5 mg total) by mouth daily.  Dispense: 90 tablet; Refill: 2 - Microalbumin / creatinine urine ratio  4. Left ear impacted cerumen Comments: She declined irrigation due to time constraints. She will schedule at a different time.  5. Estrogen deficiency Comments: She agrees to bone density. Will refer her to breast center. - DG Bone Density; Future  6. Cirrhosis of liver without ascites, unspecified hepatic cirrhosis type (HCC) Comments: Chronic, followed by Hepatology, Annamarie Major. Most recent notes reviewed in full detail (Care Everywhere). Last u/s Dec 2022 reviewed, no new lesions.  7. Class 1 obesity due to excess calories with serious comorbidity and body mass index (BMI) of 34.0 to 34.9 in adult Comments: She is encouraged to strive for BMI less than 30 to decrease cardiac risk. Advised to aim for at least 150 minutes of exercise per week.  8. Polypharmacy - Vitamin B12  Patient was given opportunity to ask questions. Patient verbalized understanding of  the plan and was able to repeat key elements of the plan. All questions were answered to their satisfaction.   I, Teresa Aliment, MD, have reviewed all documentation for this visit. The documentation on 06/23/22 for the exam, diagnosis, procedures, and orders are all accurate and complete.   THE PATIENT IS ENCOURAGED TO PRACTICE SOCIAL DISTANCING DUE TO THE COVID-19 PANDEMIC.

## 2022-06-23 NOTE — Patient Instructions (Signed)

## 2022-06-24 ENCOUNTER — Ambulatory Visit (INDEPENDENT_AMBULATORY_CARE_PROVIDER_SITE_OTHER): Payer: Medicare HMO | Admitting: Psychiatry

## 2022-06-24 DIAGNOSIS — R69 Illness, unspecified: Secondary | ICD-10-CM | POA: Diagnosis not present

## 2022-06-24 DIAGNOSIS — F4323 Adjustment disorder with mixed anxiety and depressed mood: Secondary | ICD-10-CM

## 2022-06-24 DIAGNOSIS — Z630 Problems in relationship with spouse or partner: Secondary | ICD-10-CM | POA: Diagnosis not present

## 2022-06-24 LAB — CMP14+EGFR
ALT: 17 IU/L (ref 0–32)
AST: 23 IU/L (ref 0–40)
Albumin/Globulin Ratio: 1.4 (ref 1.2–2.2)
Albumin: 4.2 g/dL (ref 3.8–4.8)
Alkaline Phosphatase: 95 IU/L (ref 44–121)
BUN/Creatinine Ratio: 12 (ref 12–28)
BUN: 12 mg/dL (ref 8–27)
Bilirubin Total: 0.4 mg/dL (ref 0.0–1.2)
CO2: 23 mmol/L (ref 20–29)
Calcium: 9.6 mg/dL (ref 8.7–10.3)
Chloride: 104 mmol/L (ref 96–106)
Creatinine, Ser: 0.99 mg/dL (ref 0.57–1.00)
Globulin, Total: 3 g/dL (ref 1.5–4.5)
Glucose: 97 mg/dL (ref 70–99)
Potassium: 3.9 mmol/L (ref 3.5–5.2)
Sodium: 140 mmol/L (ref 134–144)
Total Protein: 7.2 g/dL (ref 6.0–8.5)
eGFR: 58 mL/min/{1.73_m2} — ABNORMAL LOW (ref >59)

## 2022-06-24 LAB — LIPID PANEL
Chol/HDL Ratio: 2.1 ratio (ref 0.0–4.4)
Cholesterol, Total: 154 mg/dL (ref 100–199)
HDL: 72 mg/dL (ref >39)
LDL Chol Calc (NIH): 72 mg/dL (ref 0–99)
Triglycerides: 45 mg/dL (ref 0–149)
VLDL Cholesterol Cal: 10 mg/dL (ref 5–40)

## 2022-06-24 LAB — VITAMIN B12: Vitamin B-12: >2000 pg/mL — ABNORMAL HIGH (ref 232–1245)

## 2022-06-24 LAB — MICROALBUMIN / CREATININE URINE RATIO
Creatinine, Urine: 190.3 mg/dL
Microalb/Creat Ratio: 35 mg/g creat — ABNORMAL HIGH (ref 0–29)
Microalbumin, Urine: 66.1 ug/mL

## 2022-06-24 LAB — HEMOGLOBIN A1C
Est. average glucose Bld gHb Est-mCnc: 140 mg/dL
Hgb A1c MFr Bld: 6.5 % — ABNORMAL HIGH (ref 4.8–5.6)

## 2022-06-24 NOTE — Progress Notes (Signed)
Psychotherapy Progress Note [facility] [name], PhD LP  [name] [facility] "Teresa Golden")    MRN: [id number] Therapy format: Individual psychotherapy Date: 06/24/2022      Start: 9:11a     Stop: 10:01a     Time Spent: 50 min Location: In-person   Session narrative (presenting needs, interim history, self-report of stressors and symptoms, applications of prior therapy, status changes, and interventions made in session) "Things aren't going well."  Took Paul's complaints about his living space and job, and having run down his savings with son's wedding, and asked him about getting married (or living together) to save on expenses.  Also option to move into the same development, which he likes.  Did feel a bit rejected when he declined, realizes she's been thinking with her feelings, too (of death anxiety, for one, and loneliness).  Been more sexually active recently, too, which may unleash some drive to move ahead.  Still, doesn't want to be pigeonholed as demanding, or "moving too fast".  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  And that she worries she's f-ed this up.  Assured she hasn't, that professed plans together, and gestures she's seen, like letting his friends know about her, are louder than the words and feelings she's noted.  Advocated for letting [name] know she recognizes how it came across, of course part of her wants to nail down the love of her life through the end of her life, but she doesn't mean to demand it, and he has the absolute freedom to ask how she means it any time it feels like she's "moving too fast", because they both are trying to get used to normal relationship after hurtful ones, and perception-checking is good practice.  Note she is discontent with her church and figures she will start shopping  others.  Endorsed freedom of choice.  Also still wants to get back to some physical activity, possibly volunteer or pat-time work.  Therapeutic modalities: Cognitive Behavioral Therapy, Solution-Oriented/Positive Psychology, Ego-Supportive, and Faith-sensitive  Mental Status/Observations:  Appearance:   Casual and Well Groomed     Behavior:  Appropriate  Motor:  Normal with rollator  Speech/Language:   Clear and Coherent  Affect:  Appropriate  Mood:  normal and concerned  Thought process:  normal  Thought content:    WNL and worry  Sensory/Perceptual disturbances:    WNL  Orientation:  Fully oriented  Attention:  Good    Concentration:  Fair  Memory:  grossly intact  Insight:    Good  Judgment:   Good  Impulse Control:  Good   Risk Assessment: Danger to Self: No Self-injurious Behavior: No Danger to Others: No Physical Aggression / Violence: No Duty to Warn: No Access to Firearms a concern: No  Assessment of progress:  progressing  Diagnosis:   ICD-10-CM   1. Adjustment disorder with mixed anxiety and depressed mood  F43.23     2. Relationship problem between partners  Z63.0     3. r/o MDD-R, Dysthymia-early, GAD, hx PTSD  R69     4. Multiple comorbid health conditions  R69      Plan:  Socialization -- Continue involvement in church and other settings, discovering good people she can be friends with, including if it means shopping other churches. Relationship -- Endorse relationship with [name], with appropriate discretion, emphasizing  staying honest and consenting.  Use communication tips for working with his ambivalence.  Emphasize encouragement to perception-check and hold off jumping to conclusions about her rush or his commitment phobia. Self-esteem -- Continue to practice self-esteem grounded in godly approval and full authority to decide what to participate in and what not General health -- Endorse any healthy activity, to include pool/gym if able and  interested. Family -- Endorse boundary/limits with [name] Work -- Endorse option to work part-time if able Other recommendations/advice as may be noted above Continue to utilize previously learned skills ad lib Maintain medication as prescribed and work faithfully with relevant prescriber(s) if any changes are desired or seem indicated Call the clinic on-call service, 988/hotline, 911, or present to [facility] or ER if any life-threatening psychiatric crisis Return for time as already scheduled. Already scheduled visit in this office 07/08/2022.  Robley Fries, PhD [name], PhD LP Clinical Psychologist, [facility] [address] [place], [place] [address] [phone]

## 2022-06-30 DIAGNOSIS — H6122 Impacted cerumen, left ear: Secondary | ICD-10-CM | POA: Insufficient documentation

## 2022-06-30 DIAGNOSIS — E2839 Other primary ovarian failure: Secondary | ICD-10-CM | POA: Insufficient documentation

## 2022-07-01 ENCOUNTER — Ambulatory Visit: Payer: Self-pay | Admitting: Licensed Clinical Social Worker

## 2022-07-01 NOTE — Patient Outreach (Signed)
  Care Coordination   Follow Up Visit Note   07/01/2022 Name: Teresa Golden MRN: [id number] DOB: [birth year]  Teresa Golden is a 77 y.o. year old female who sees Dorothyann Peng, MD for primary care. I spoke with  Margrett Rud by phone today.  What matters to the patients health and wellness today?  Medication/Symptom Management    Goals Addressed             This Visit's Progress    Supportive Resources   On track    Activities and task to complete in order to accomplish goals.   Keep all upcoming appointment discussed today Continue with compliance of taking medication prescribed by Doctor Continue to exercise at [facility] to promote well-being Continue utilizing [employer], as needed for transportation services, in addition, to other local resources discussed Continue to implement healthy coping skills discussed to promote health and well-being              SDOH assessments and interventions completed:  No     Care Coordination Interventions:  Yes, provided  Interventions Today    Flowsheet Row Most Recent Value  Chronic Disease   Chronic disease during today's visit Hypertension (HTN), Diabetes, Chronic Kidney Disease/End Stage Renal Disease (ESRD)  General Interventions   General Interventions Discussed/Reviewed General Interventions Reviewed, Doctor Visits  [Reviewed upcoming appts. No transportation barriers identified]  Doctor Visits Discussed/Reviewed Doctor Visits Discussed, PCP, Specialist  [Pt reviewed tx plans from GI and PCP]  Exercise Interventions   Exercise Discussed/Reviewed Physical Activity  Physical Activity Discussed/Reviewed Gym  [Pt continues to participate in exercise at the [facility]  Mental Health Interventions   Mental Health Discussed/Reviewed Mental Health Reviewed  Nutrition Interventions   Nutrition Discussed/Reviewed Nutrition Reviewed  [Patient is increasing fiber intake, as recommended by GI]  Pharmacy Interventions   Pharmacy Dicussed/Reviewed  Pharmacy Topics Reviewed, Medication Adherence, Affording Medications  [Pcp plans to mail coupon for Jardiance to assist with medication costs. Pt utilizes [facility], who will deliver medication]       Follow up plan: Follow up call scheduled for 2-4 weeks    Encounter Outcome:  Pt. Visit Completed   Jenel Lucks, MSW, LCSW [facility] Care Management [facility]  [facility] [email or web address] Phone [phone] 10:52 AM

## 2022-07-01 NOTE — Patient Instructions (Signed)
Visit Information  Thank you for taking time to visit with me today. Please don't hesitate to contact me if I can be of assistance to you.   Following are the goals we discussed today:   Goals Addressed             This Visit's Progress    Supportive Resources   On track    Activities and task to complete in order to accomplish goals.   Keep all upcoming appointment discussed today Continue with compliance of taking medication prescribed by Doctor Continue to exercise at [facility] to promote well-being Continue utilizing [employer], as needed for transportation services, in addition, to other local resources discussed Continue to implement healthy coping skills discussed to promote health and well-being              Our next appointment is by telephone on 06/04 at 10:30 AM  Please call the care guide team at [phone] if you need to cancel or reschedule your appointment.   If you are experiencing a Mental Health or Behavioral Health Crisis or need someone to talk to, please call the Suicide and Crisis Lifeline: 988 call 911   Patient verbalizes understanding of instructions and care plan provided today and agrees to view in MyChart. Active MyChart status and patient understanding of how to access instructions and care plan via MyChart confirmed with patient.     Jenel Lucks, MSW, LCSW [facility] Care Management [place]  [facility] [email or web address] Phone [phone] 10:53 AM

## 2022-07-03 ENCOUNTER — Other Ambulatory Visit: Payer: Self-pay | Admitting: Internal Medicine

## 2022-07-03 DIAGNOSIS — Z1231 Encounter for screening mammogram for malignant neoplasm of breast: Secondary | ICD-10-CM

## 2022-07-03 DIAGNOSIS — E2839 Other primary ovarian failure: Secondary | ICD-10-CM

## 2022-07-08 ENCOUNTER — Ambulatory Visit (INDEPENDENT_AMBULATORY_CARE_PROVIDER_SITE_OTHER): Payer: Medicare HMO | Admitting: Psychiatry

## 2022-07-08 DIAGNOSIS — R69 Illness, unspecified: Secondary | ICD-10-CM | POA: Diagnosis not present

## 2022-07-08 DIAGNOSIS — F4323 Adjustment disorder with mixed anxiety and depressed mood: Secondary | ICD-10-CM | POA: Diagnosis not present

## 2022-07-08 DIAGNOSIS — F3340 Major depressive disorder, recurrent, in remission, unspecified: Secondary | ICD-10-CM

## 2022-07-08 DIAGNOSIS — Z630 Problems in relationship with spouse or partner: Secondary | ICD-10-CM | POA: Diagnosis not present

## 2022-07-08 NOTE — Progress Notes (Signed)
Psychotherapy Progress Note [facility] Teresa Czar, PhD LP  [name] [facility] "Teresa Golden")    MRN: [id number] Therapy format: Individual psychotherapy Date: 07/08/2022      Start: 9:14a     Stop: 10:01a     Time Spent: 47 min Location: In-person   Session narrative (presenting needs, interim history, self-report of stressors and symptoms, applications of prior therapy, status changes, and interventions made in session) Getting clearer that Teresa Golden is not on the same track for a relationship, not the same kind of available that she is right now.  Figures still to see him, but trying to be more realistic about pace and expectations.  Does hear him talking about wanting to be better situated.  News that he is filing for VA disability, citing his eyesight.  Now thinking also that he may have "wet brain" -- effects of past alcohol consumption on his attention and concentration.  Reminds her of how she got after daughter's death -- depressed, demotivated, taking time off, not cooking, forgetting something on the stove, not wanting to do the train commute in [employer].  Eventually woke up to her need for help, went inpatient via the good graces of a clinician friend Teresa Golden [place], in [place]).  Tells it b/c she sees Teresa Golden forgetting his keys, easily agitated, knows he does not sleep well, falls asleep involuntarily fairly often.  Admittedly feels "stupid" about the relationship, but accepts that it is not the same as indulging a cheater, like her previous BF.  Background to feeling stupid includes her own hx teen pregnancy, grad h.s. 77yo.  Affirmed and encouraged.  Therapeutic modalities: Cognitive Behavioral Therapy, Solution-Oriented/Positive Psychology, Environmental manager, and Faith-sensitive  Mental Status/Observations:  Appearance:   Casual     Behavior:  Appropriate  Motor:  Walker  Speech/Language:   Clear and Coherent  Affect:  Appropriate  Mood:  dysthymic   Thought process:  normal  Thought content:    worry  Sensory/Perceptual disturbances:    WNL  Orientation:  Fully oriented  Attention:  Good    Concentration:  Good  Memory:  WNL  Insight:    Good  Judgment:   Good  Impulse Control:  Good   Risk Assessment: Danger to Self: No Self-injurious Behavior: No Danger to Others: No Physical Aggression / Violence: No Duty to Warn: No Access to Firearms a concern: No  Assessment of progress:  stabilized  Diagnosis:   ICD-10-CM   1. Adjustment disorder with mixed anxiety and depressed mood  F43.23     2. Relationship problem between partners  Z63.0     3. MDD (recurrent major depressive disorder) in remission (HCC)  F33.40     4. Multiple comorbid health conditions  R69      Plan:  Socialization -- Continue involvement in church and other settings, discovering good people she can be friends with, including if it means shopping other churches. Relationship -- Endorse relationship with Teresa Golden, with appropriate discretion, emphasizing staying honest and consenting.  Use communication tips for working with his ambivalence.  Emphasize encouragement to perception-check and hold off jumping to conclusions about her rush or his commitment phobia. Self-esteem -- Continue to practice self-esteem grounded in godly approval and full authority to decide what to participate in and what not.  Self-affirm that whatever she might deem "stupid", she has either learned, improved, or was doing the best she could figure out at the time. General health -- Endorse any healthy activity, to include  pool/gym if able and interested. Family -- Endorse boundary/limits with Teresa Golden Work -- Endorse option to work part-time if able Other recommendations/advice as may be noted above Continue to utilize previously learned skills ad lib Maintain medication as prescribed and work faithfully with relevant prescriber(s) if any changes are desired or seem indicated Call the  clinic on-call service, 988/hotline, 911, or present to [facility] or ER if any life-threatening psychiatric crisis Return for time as already scheduled. Already scheduled visit in this office 07/22/2022.  Teresa Fries, PhD Teresa Czar, PhD LP Clinical Psychologist, [facility] [address], [address] [place], [place] [address] [phone]

## 2022-07-16 ENCOUNTER — Ambulatory Visit: Payer: Self-pay

## 2022-07-16 ENCOUNTER — Ambulatory Visit (INDEPENDENT_AMBULATORY_CARE_PROVIDER_SITE_OTHER): Payer: Medicare HMO | Admitting: Family Medicine

## 2022-07-16 ENCOUNTER — Encounter: Payer: Self-pay | Admitting: Family Medicine

## 2022-07-16 VITALS — BP 112/67 | HR 71 | Temp 98.4°F | Ht 65.0 in | Wt 202.0 lb

## 2022-07-16 DIAGNOSIS — R82998 Other abnormal findings in urine: Secondary | ICD-10-CM | POA: Diagnosis not present

## 2022-07-16 DIAGNOSIS — R300 Dysuria: Secondary | ICD-10-CM | POA: Diagnosis not present

## 2022-07-16 LAB — POCT URINALYSIS DIPSTICK
Bilirubin, UA: NEGATIVE
Glucose, UA: POSITIVE — AB
Ketones, UA: NEGATIVE
Nitrite, UA: NEGATIVE
Protein, UA: POSITIVE — AB
Spec Grav, UA: 1.015 (ref 1.010–1.025)
Urobilinogen, UA: 0.2 E.U./dL
pH, UA: 5.5 (ref 5.0–8.0)

## 2022-07-16 MED ORDER — CEPHALEXIN 500 MG PO CAPS: 500.0000 mg | ORAL_CAPSULE | Freq: Three times a day (TID) | ORAL | 0 refills | Status: AC

## 2022-07-16 NOTE — Patient Outreach (Signed)
  Care Coordination   07/16/2022 Name: Teresa Golden MRN: [id number] DOB: June 03, 1945   Care Coordination Outreach Attempts:  An unsuccessful telephone outreach was attempted for a scheduled appointment today.  Follow Up Plan:  Additional outreach attempts will be made to offer the patient care coordination information and services.   Encounter Outcome:  No Answer   Care Coordination Interventions:  No, not indicated    Delsa Sale, RN, BSN, CCM Care Management Coordinator [facility] Care Management  Direct Phone: [phone]

## 2022-07-16 NOTE — Progress Notes (Signed)
I,Jameka J Llittleton,acting as a Neurosurgeon for [employer], NP.,have documented all relevant documentation on the behalf of PAT OHENHEN, NP,as directed by  PAT Moshe Salisbury, NP while in the presence of PAT OHENHEN, NP.    Subjective:     [name] , female    DOB: 1945/07/08 , 77 y.o.   MRN: [id number]   Chief Complaint  Patient presents with   Dysuria    HPI  Patient presents today for symptoms of burning, hesitancy and urgency with urinating.She states that these symptoms started about 5 days ago and she starting drinking more water and also taking her AZO tablets. Patient also reports her urine is really cloudy.   Wt Readings from Last 3 Encounters: 07/16/22 : 202 lb (91.6 kg) 06/23/22 : 208 lb 3.2 oz (94.4 kg) 05/14/22 : 206 lb (93.4 kg)    Dysuria  This is a new problem. The current episode started in the past 7 days. The problem occurs every urination. The problem has been gradually worsening. The quality of the pain is described as burning and aching. The pain is moderate. There has been no fever. Associated symptoms include hesitancy and urgency. She has tried nothing for the symptoms. The treatment provided no relief.     Past Medical History:  Diagnosis Date   Arthritis    Chronic kidney disease    self reports ckd stage 3    Depression    Diabetes mellitus, type II, insulin dependent (HCC)    With neurologic complications. 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    Hypertensive nephropathy 07/05/2018   Hypothyroidism    Hypothyroidism 02/06/2018   Morbid obesity with BMI of 50.0-59.9, adult (HCC)    Scoliosis      Family History  Problem Relation Age of Onset   Heart attack Mother    Heart failure Mother     Stroke Father      Current Outpatient Medications:    allopurinol (ZYLOPRIM) 100 MG tablet, Take 1 tablet (100 mg total) by mouth daily., Disp: 90 tablet, Rfl: 2   amLODipine (NORVASC) 5 MG tablet, Take 1 tablet (5 mg total) by mouth daily., Disp: 90 tablet, Rfl: 2   AZO-CRANBERRY PO, Take 1 tablet by mouth daily., Disp: , Rfl:    B Complex-C (B-COMPLEX WITH VITAMIN C) tablet, Take 1 tablet by mouth 3 (three) times a week., Disp: , Rfl:    Biotin 40981 MCG TABS, Take 10,000 mcg by mouth daily., Disp: , Rfl:    cephALEXin (KEFLEX) 500 MG capsule, Take 1 capsule (500 mg total) by mouth 3 (three) times daily for 7 days., Disp: 21 capsule, Rfl: 0   cetirizine (ZYRTEC) 10 MG tablet, Take 10 mg by mouth at bedtime., Disp: , Rfl:    Cholecalciferol 5000 units TABS, Take 5,000 Units by mouth daily., Disp: , Rfl:    colchicine 0.6 MG tablet, Take 1 tablet by mouth for gout attack., Disp: 30 tablet, Rfl: 1   diclofenac Sodium (VOLTAREN) 1 % GEL, APPLY 2 GRAMS TO AFFECTED AREA(S) 4 TIMES DAILY AS NEEDED (Patient taking differently: Apply 2-4 g topically every 6 (six) hours as needed (to affected areas).), Disp: 200 g, Rfl: 2  gabapentin (NEURONTIN) 300 MG capsule, Take 600 mg by mouth at bedtime., Disp: , Rfl:    glucose blood (ONETOUCH VERIO) test strip, USE TO CHECK BLOOD SUGAR   TWO TIMES A DAY AS         INSTRUCTED, Disp: 200 strip, Rfl: 3   Insulin Degludec (TRESIBA) 100 UNIT/ML SOLN, Inject 8 Units/oz/day into the skin. Patient is taking at bedtime, Disp: , Rfl:    Lancets (ONETOUCH DELICA PLUS LANCET33G) MISC, USE TO CHECK BLOOD SUGARS  TWO TIMES A DAY AS         INSTRUCTED, Disp: 200 each, Rfl: 3   levothyroxine (SYNTHROID) 25 MCG tablet, Take 1/2 tablet daily., Disp: 90 tablet, Rfl: 1   losartan (COZAAR) 100 MG tablet, Take 1 tablet (100 mg total) by mouth daily., Disp: 90 tablet, Rfl: 2   metoprolol succinate (TOPROL-XL) 25 MG 24 hr tablet, TAKE 1 TABLET AT BEDTIME   (DISCONTINUE COREG), Disp:  90 tablet, Rfl: 1   Multiple Vitamins-Minerals (MULTIVITAMIN WITH MINERALS) tablet, Take 1 tablet by mouth daily. Women's 50+, Disp: , Rfl:    pantoprazole (PROTONIX) 40 MG tablet, Take 40 mg by mouth daily as needed., Disp: , Rfl:    Semaglutide, 1 MG/DOSE, (OZEMPIC, 1 MG/DOSE,) 4 MG/3ML SOPN, Inject 1 mg into the skin once a week., Disp: 9 mL, Rfl: 1   simvastatin (ZOCOR) 10 MG tablet, Take 1 tablet (10 mg total) by mouth daily., Disp: 90 tablet, Rfl: 0   Allergies  Allergen Reactions   Meloxicam Other (See Comments)    Fever; muscle aches; "flu-like" symptoms "Allergic," per MAR   Other Other (See Comments)    "Rose fever and hay fever"     Review of Systems  Genitourinary:  Positive for dysuria, hesitancy and urgency.  Skin: Negative.   Neurological: Negative.   Psychiatric/Behavioral: Negative.       Today's Vitals   07/16/22 0858  BP: 112/67  Pulse: 71  Temp: 98.4 F (36.9 C)  Weight: 202 lb (91.6 kg)  Height: 5\' 5"  (1.651 m)  PainSc: 0-No pain   Body mass index is 33.61 kg/m.  The 10-year ASCVD risk score (Arnett DK, et al., 2019) is: 30%   Values used to calculate the score:     Age: 32 years     Sex: Female     Is Non-Hispanic African American: Yes     Diabetic: Yes     Tobacco smoker: No     Systolic Blood Pressure: 112 mmHg     Is BP treated: Yes     HDL Cholesterol: 72 mg/dL     Total Cholesterol: 154 mg/dL ++ Objective:  Physical Exam Abdominal:     Tenderness: There is no right CVA tenderness or left CVA tenderness.  Neurological:     Mental Status: She is alert and oriented to person, place, and time.  Psychiatric:        Mood and Affect: Mood normal.         Assessment And Plan:     1. Dysuria Comments: Symptoms have been ongoing for about 5 days - POCT Urinalysis Dipstick (81002)  2. Leukocytes in urine Comments: treat with Antibiotics for 7 days ; send urine off for culture. - Culture, Urine    Return if symptoms worsen or fail to  improve.  Patient was given opportunity to ask questions. Patient verbalized understanding of the plan and was able to repeat key elements of the plan. All questions were answered to  their satisfaction.  PAT Moshe Salisbury, NP   I, PAT Moshe Salisbury, NP, have reviewed all documentation for this visit. The documentation on 07/16/22 for the exam, diagnosis, procedures, and orders are all accurate and complete.   IF YOU HAVE BEEN REFERRED TO A SPECIALIST, IT MAY TAKE 1-2 WEEKS TO SCHEDULE/PROCESS THE REFERRAL. IF YOU HAVE NOT HEARD FROM US/SPECIALIST IN TWO WEEKS, PLEASE GIVE [place] A CALL AT [phone].   THE PATIENT IS ENCOURAGED TO PRACTICE SOCIAL DISTANCING DUE TO THE COVID-19 PANDEMIC.

## 2022-07-16 NOTE — Patient Outreach (Signed)
  Care Coordination   Follow Up Visit Note   07/16/2022 Name: Teresa Golden MRN: [id number] DOB: [birth year]  Teresa Golden is a 77 y.o. year old female who sees Dorothyann Peng, MD for primary care. I spoke with  Margrett Rud by phone today.  What matters to the patients health and wellness today?  Patient would like to recover from her UTI without complications.     Goals Addressed               This Visit's Progress     Patient Stated     To get over UTI without complications (pt-stated)        Care Coordination Interventions: Evaluation of current treatment plan related to impaired urinary elimination  and patient's adherence to plan as established by provider Discussed with patient recent PCP follow up to evaluate for symptoms suggestive of UTI Reviewed and discussed with patient UA results with Urine Culture pending, reviewed and discussed PCP recommendations for patient to take Cephalexin 500 mg tid x 7 days, she will contact [facility] today to request delivery of this medication Educated patient about the indication, dosage and frequency of Jardiance including potential SE and when to call the doctor  Educated patient on the importance to staying well hydrated with water aiming for 48 - 64 oz daily, balance activity with rest and complete full course of antibiotics Instructed patient to keep Dr. Grayling Congress well informed of new or worsening symptoms      Interventions Today    Flowsheet Row Most Recent Value  Chronic Disease   Chronic disease during today's visit Diabetes, Chronic Kidney Disease/End Stage Renal Disease (ESRD), Other  [UTI]  General Interventions   General Interventions Discussed/Reviewed General Interventions Discussed, General Interventions Reviewed, Labs, Doctor Visits  Doctor Visits Discussed/Reviewed Doctor Visits Discussed, Doctor Visits Reviewed, PCP  Exercise Interventions   Physical Activity Discussed/Reviewed Physical Activity Reviewed,  Physical Activity Discussed, Types of exercise  Education Interventions   Education Provided Provided Education  Provided Verbal Education On Nutrition, Labs, Medication, When to see the doctor  Labs Reviewed --  [UA]  Mental Health Interventions   Mental Health Discussed/Reviewed Mental Health Discussed, Mental Health Reviewed, Coping Strategies  Nutrition Interventions   Nutrition Discussed/Reviewed Nutrition Discussed, Nutrition Reviewed, Fluid intake  Pharmacy Interventions   Pharmacy Dicussed/Reviewed Pharmacy Topics Discussed, Pharmacy Topics Reviewed, Medications and their functions          SDOH assessments and interventions completed:  No     Care Coordination Interventions:  Yes, provided   Follow up plan: Follow up call scheduled for 08/12/22 @09 :30 AM    Encounter Outcome:  Pt. Visit Completed

## 2022-07-16 NOTE — Patient Instructions (Signed)
Visit Information  Thank you for taking time to visit with me today. Please don't hesitate to contact me if I can be of assistance to you.   Following are the goals we discussed today:   Goals Addressed               This Visit's Progress     Patient Stated     To get over UTI without complications (pt-stated)        Care Coordination Interventions: Evaluation of current treatment plan related to impaired urinary elimination  and patient's adherence to plan as established by provider Discussed with patient recent PCP follow up to evaluate for symptoms suggestive of UTI Reviewed and discussed with patient UA results with Urine Culture pending, reviewed and discussed PCP recommendations for patient to take Cephalexin 500 mg tid x 7 days, she will contact [facility] today to request delivery of this medication Educated patient about the indication, dosage and frequency of Jardiance including potential SE and when to call the doctor  Educated patient on the importance to staying well hydrated with water aiming for 48 - 64 oz daily, balance activity with rest and complete full course of antibiotics Instructed patient to keep Dr. Grayling Congress well informed of new or worsening symptoms           Our next appointment is by telephone on 08/12/22 at 09:30 AM  Please call the care guide team at [phone] if you need to cancel or reschedule your appointment.   If you are experiencing a Mental Health or Behavioral Health Crisis or need someone to talk to, please call 1-800-273-TALK (toll free, 24 hour hotline) go to [facility] [address] [place] [phone])  Patient verbalizes understanding of instructions and care plan provided today and agrees to view in MyChart. Active MyChart status and patient understanding of how to access instructions and care plan via MyChart confirmed with patient.     Delsa Sale, RN, BSN, CCM Care Management  Coordinator [facility] Care Management Direct Phone: [phone]

## 2022-07-19 LAB — URINE CULTURE

## 2022-07-22 ENCOUNTER — Ambulatory Visit (INDEPENDENT_AMBULATORY_CARE_PROVIDER_SITE_OTHER): Payer: Medicare HMO | Admitting: Psychiatry

## 2022-07-22 DIAGNOSIS — R69 Illness, unspecified: Secondary | ICD-10-CM | POA: Diagnosis not present

## 2022-07-22 DIAGNOSIS — Z630 Problems in relationship with spouse or partner: Secondary | ICD-10-CM | POA: Diagnosis not present

## 2022-07-22 DIAGNOSIS — F3340 Major depressive disorder, recurrent, in remission, unspecified: Secondary | ICD-10-CM | POA: Diagnosis not present

## 2022-07-22 DIAGNOSIS — F4323 Adjustment disorder with mixed anxiety and depressed mood: Secondary | ICD-10-CM | POA: Diagnosis not present

## 2022-07-22 NOTE — Progress Notes (Signed)
Psychotherapy Progress Note [facility] [name], PhD LP  Patient ID: FRANKIE LUMBRA [facility] "[name]")    MRN: [id number] Therapy format: Individual psychotherapy Date: 07/22/2022      Start: 9:04a     Stop: 8:54a     Time Spent: 50 min Location: In-person   Session narrative (presenting needs, interim history, self-report of stressors and symptoms, applications of prior therapy, status changes, and interventions made in session) No longer with [name].  Got tired of his complaining, largely, but more so feeling second-classed by his decisions and walled out of his plans [employer], visiting family).  Despite agreeing he is just not ready for more commitment, tried to probe whether he'd be willing to let her come visit church for a performance of his, or take her to his family holiday gathering in [place], but his answers made it clearer he wanted to limit being seen with her.  Ensuing arguments involved him again telling her she's "moving too fast", cussing her out, and saying he never asked her to be his woman, loud enough she believes to disturb the peace at her apartment complex.  Felt she really can't afford to pin hopes on him and felt stupid for getting so involved with him in the first place.  Interrupted in session by a call from [place], seeking to check in with her, and a few minutes of overheard conversation included him trying to assert that he is controlling his temper now (but conspicuously not apologizing for having lost it).  After the call, states she can't afford to open back up to him, he's too defensive, and maybe she made a mistake getting involved.  Validated her wish to be loved, her continued interest in intimacy, and her right to take chances.  Very clear he's not treated her like [name] did, and she should not feel either used or stupid about it.    Explains a hx of feeling things are her fault, originating with being an only child, mother being chronically sick,  parents splitting at 5yo, going to live with father, and admittedly becoming a promiscuous teenager.  1st pregnancy gave up for adoption (her father put her in a [employer] home for the purpose), then pregnant again at 38 with her daughter and married at 89.  H Richard was effectively motherless himself, a love child given to a foster mother, then claimed back by father and raised by stepmother.  Envied Richard's family, though later found how dysfunctional they were.  Ultimately, he fooled around, ending the marriage.  Later felt like she was a bad mother for various reasons that seem to have had most to do with feeling discontent.  Of course, her daughter died later on.  Tends to feel like a failure for not having children or grandchildren, and now no BF.  Concurred with reported perspective of friend Marcelino Duster that she has been doing what comes naturally with the cards she's dealt, and she is ultimately just trying to love and be loved, like the rest of [place].  Interpreted her attraction to [place] as also positive and natural, partly attracted to a reminder of her responsible father.  Does  fel she is, or should be, done with relationships now, between her age and health and the letdown right now.  Granted both her prerogative to decide that and her ability to redecide if an opportunity presents.  Incorporating her faith, addressed shame from a [place] standpoint, noting how Jesus treated people who considered themselves  or were considered shameful and suggesting a reading.  Therapeutic modalities: Cognitive Behavioral Therapy, Solution-Oriented/Positive Psychology, Environmental manager, and Faith-sensitive  Mental Status/Observations:  Appearance:   Casual     Behavior:  Appropriate  Motor:  Requires walker  Speech/Language:   Clear and Coherent  Affect:  Appropriate  Mood:  sad  Thought process:  normal  Thought content:    WNL  Sensory/Perceptual disturbances:    WNL  Orientation:  Fully oriented   Attention:  Good    Concentration:  Good  Memory:  WNL  Insight:    Good  Judgment:   Good  Impulse Control:  Good   Risk Assessment: Danger to Self: No Self-injurious Behavior: No Danger to Others: No Physical Aggression / Violence: No Duty to Warn: No Access to Firearms a concern: No  Assessment of progress:  stabilized  Diagnosis:   ICD-10-CM   1. Adjustment disorder with mixed anxiety and depressed mood  F43.23     2. Relationship problem between partners  Z63.0     3. MDD (recurrent major depressive disorder) in remission (HCC)  F33.40     4. Multiple comorbid health conditions  R69      Plan:  Socialization -- Continue involvement in church and other settings, discovering good people she can be friends with, including if it means shopping other churches. Relationship -- Endorse choice over ending the relationship with [name].  Encourage attention to conclusion-jumping, willingness to check perceptions, and cont understanding that he has a commitment phobia, but she has the right not to put up with invalidation in how he expresses it, too. Self-esteem -- Continue to practice self-esteem grounded in God's unconditional love and full authority to decide what to participate in and what not.  Self-affirm that whatever she might deem "stupid", she has either learned, improved, or was doing the best she could figure out at the time.  When tempted to feel like a failure for lack of family, reframe self-image as a survivor who had limited opportunity to make family. General health -- Endorse any healthy activity, to include pool/gym if able and interested. Family -- Endorse boundary/limits with [name] Work -- Endorse option to work part-time if truly able and interested Other recommendations/advice as may be noted above Continue to utilize previously learned skills ad lib Maintain medication as prescribed and work faithfully with relevant prescriber(s) if any changes are desired or  seem indicated Call the clinic on-call service, 988/hotline, 911, or present to [facility] or ER if any life-threatening psychiatric crisis Return for time as already scheduled. Already scheduled visit in this office 08/05/2022.  Robley Fries, PhD [name], PhD LP Clinical Psychologist, [facility] [address] [place], [place] [address] [phone]

## 2022-07-28 ENCOUNTER — Ambulatory Visit: Payer: Self-pay

## 2022-07-28 ENCOUNTER — Ambulatory Visit (INDEPENDENT_AMBULATORY_CARE_PROVIDER_SITE_OTHER): Payer: Medicare HMO | Admitting: Internal Medicine

## 2022-07-28 ENCOUNTER — Telehealth: Payer: Self-pay

## 2022-07-28 ENCOUNTER — Encounter: Payer: Self-pay | Admitting: Internal Medicine

## 2022-07-28 ENCOUNTER — Other Ambulatory Visit: Payer: Self-pay | Admitting: Internal Medicine

## 2022-07-28 VITALS — BP 122/84 | HR 62 | Temp 98.1°F | Ht 65.0 in | Wt 202.6 lb

## 2022-07-28 DIAGNOSIS — E6609 Other obesity due to excess calories: Secondary | ICD-10-CM

## 2022-07-28 DIAGNOSIS — N1831 Chronic kidney disease, stage 3a: Secondary | ICD-10-CM | POA: Diagnosis not present

## 2022-07-28 DIAGNOSIS — Z6833 Body mass index (BMI) 33.0-33.9, adult: Secondary | ICD-10-CM

## 2022-07-28 DIAGNOSIS — C220 Liver cell carcinoma: Secondary | ICD-10-CM | POA: Diagnosis not present

## 2022-07-28 DIAGNOSIS — E1122 Type 2 diabetes mellitus with diabetic chronic kidney disease: Secondary | ICD-10-CM

## 2022-07-28 DIAGNOSIS — I129 Hypertensive chronic kidney disease with stage 1 through stage 4 chronic kidney disease, or unspecified chronic kidney disease: Secondary | ICD-10-CM

## 2022-07-28 DIAGNOSIS — Z794 Long term (current) use of insulin: Secondary | ICD-10-CM

## 2022-07-28 MED ORDER — EMPAGLIFLOZIN 10 MG PO TABS
10.0000 mg | ORAL_TABLET | Freq: Every day | ORAL | 5 refills | Status: DC
Start: 2022-07-28 — End: 2022-08-04

## 2022-07-28 NOTE — Telephone Encounter (Signed)
This encounter was created in error - please disregard.

## 2022-07-28 NOTE — Patient Outreach (Signed)
  Care Coordination   Follow Up Visit Note   07/28/2022 Name: AUTUMM STROHMAIER MRN: [id number] DOB: [birth year]  BETHYL ISHIHARA is a 77 y.o. year old female who sees Dorothyann Peng, MD for primary care. I received a voice message from patient regarding concerns about taking Jardiance.   What matters to the patients health and wellness today?  Patient would like to discuss noted SE to Jardiance with PCP at upcoming visit.     Goals Addressed               This Visit's Progress     Patient Stated     I want to lower my A1c to 6.0 (pt-stated)        Care Coordination Interventions: Received voice message from patient stating she does not feel Jardiance is the best medication for her due to having symptoms of dehydration despite drinking enough water, message from patient states she plans to discuss her concern with Dr. Allyne Gee at upcoming visit scheduled for 07/28/22 @09 :00 AM Routed note to PCP        SDOH assessments and interventions completed:  No     Care Coordination Interventions:  Yes, provided   Follow up plan: Follow up call scheduled for 08/12/22 @09 :30 AM    Encounter Outcome:  Pt. Visit Completed

## 2022-07-28 NOTE — Progress Notes (Signed)
I,[name],acting as a scribe for [name], MD.,have documented all relevant documentation on the behalf of [name], MD,as directed by  [name], MD while in the presence of [name], MD.    Subjective:     [name] , female    DOB: January 27, 1946 , 77 y.o.   MRN: [id number]   Chief Complaint  Patient presents with   Diabetes   Hypertension    HPI  Patient presents today for Jardiance follow up. She reports compliance with medication.  She admits completing 4 week samples given at previous visit.  She reports when beginning medication she experienced lower extremity cramping. She states no longer experiencing cramping currently.  She adds, exercising regularly.      Past Medical History:  Diagnosis Date   Arthritis    Chronic kidney disease    self reports ckd stage 3    Depression    Diabetes mellitus, type II, insulin dependent (HCC)    With neurologic complications. 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    Hypertensive nephropathy 07/05/2018   Hypothyroidism    Hypothyroidism 02/06/2018   Morbid obesity with BMI of 50.0-59.9, adult (HCC)    Scoliosis      Family History  Problem Relation Age of Onset   Heart attack Mother    Heart failure Mother    Stroke Father      Current Outpatient Medications:    allopurinol (ZYLOPRIM) 100 MG tablet, Take 1 tablet (100 mg total) by mouth daily., Disp: 90 tablet, Rfl: 2   amLODipine (NORVASC) 5 MG tablet, Take 1 tablet (5 mg total) by mouth daily., Disp: 90 tablet, Rfl: 2   AZO-CRANBERRY PO, Take 1 tablet by mouth daily., Disp: , Rfl:    B Complex-C (B-COMPLEX WITH VITAMIN C) tablet, Take 1 tablet by mouth 3 (three) times a week.,  Disp: , Rfl:    Biotin 40981 MCG TABS, Take 10,000 mcg by mouth daily., Disp: , Rfl:    cetirizine (ZYRTEC) 10 MG tablet, Take 10 mg by mouth at bedtime., Disp: , Rfl:    Cholecalciferol 5000 units TABS, Take 5,000 Units by mouth daily., Disp: , Rfl:    colchicine 0.6 MG tablet, Take 1 tablet by mouth for gout attack., Disp: 30 tablet, Rfl: 1   diclofenac Sodium (VOLTAREN) 1 % GEL, APPLY 2 GRAMS TO AFFECTED AREA(S) 4 TIMES DAILY AS NEEDED (Patient taking differently: Apply 2-4 g topically every 6 (six) hours as needed (to affected areas).), Disp: 200 g, Rfl: 2   empagliflozin (JARDIANCE) 10 MG TABS tablet, Take 1 tablet (10 mg total) by mouth daily before breakfast., Disp: 30 tablet, Rfl: 5   gabapentin (NEURONTIN) 300 MG capsule, Take 600 mg by mouth at bedtime., Disp: , Rfl:    glucose blood (ONETOUCH VERIO) test strip, USE TO CHECK BLOOD SUGAR   TWO TIMES A DAY AS         INSTRUCTED, Disp: 200 strip, Rfl: 3   Insulin Degludec (TRESIBA) 100 UNIT/ML SOLN, Inject 8 Units/oz/day into the skin. Patient is taking at bedtime, Disp: , Rfl:    Lancets (ONETOUCH DELICA PLUS LANCET33G) MISC, USE TO CHECK BLOOD SUGARS  TWO TIMES A DAY AS         INSTRUCTED, Disp: 200 each, Rfl: 3   levothyroxine (SYNTHROID) 25 MCG tablet, Take 1/2 tablet daily., Disp: 90 tablet, Rfl: 1   losartan (COZAAR) 100 MG tablet, Take 1 tablet (100 mg total) by mouth daily., Disp: 90 tablet, Rfl: 2   metoprolol succinate (TOPROL-XL) 25 MG 24 hr tablet, TAKE 1 TABLET AT BEDTIME   (DISCONTINUE COREG), Disp: 90 tablet, Rfl: 1   Multiple Vitamins-Minerals (MULTIVITAMIN WITH MINERALS) tablet, Take 1 tablet by mouth daily. Women's 50+, Disp: , Rfl:    pantoprazole (PROTONIX) 40 MG tablet, Take 40 mg by mouth daily as needed., Disp: , Rfl:    Semaglutide, 1 MG/DOSE, (OZEMPIC, 1 MG/DOSE,) 4 MG/3ML SOPN, Inject 1 mg into the skin once a week., Disp: 9 mL, Rfl: 1   simvastatin (ZOCOR) 10 MG tablet, Take 1 tablet (10 mg total) by mouth daily.,  Disp: 90 tablet, Rfl: 0   Allergies  Allergen Reactions   Meloxicam Other (See Comments)    Fever; muscle aches; "flu-like" symptoms "Allergic," per MAR   Other Other (See Comments)    "Rose fever and hay fever"     Review of Systems  Constitutional: Negative.   Respiratory: Negative.    Cardiovascular: Negative.   Gastrointestinal: Negative.   Neurological: Negative.   Psychiatric/Behavioral: Negative.       Today's Vitals   07/28/22 0926  BP: 122/84  Pulse: 62  Temp: 98.1 F (36.7 C)  SpO2: 98%  Weight: 202 lb 9.6 oz (91.9 kg)  Height: 5\' 5"  (1.651 m)   Wt Readings from Last 3 Encounters:  07/28/22 202 lb 9.6 oz (91.9 kg)  07/16/22 202 lb (91.6 kg)  06/23/22 208 lb 3.2 oz (94.4 kg)    Body mass index is 33.71 kg/m.  The 10-year ASCVD risk score (Arnett DK, et al., 2019) is: 32.8%   Values used to calculate the score:     Age: 49 years     Sex: Female     Is Non-Hispanic African American: Yes     Diabetic: Yes     Tobacco smoker: No     Systolic Blood Pressure: 122 mmHg     Is BP treated: Yes     HDL Cholesterol: 72 mg/dL     Total Cholesterol: 154 mg/dL ++ Objective:  Physical Exam Vitals and nursing note reviewed.  Constitutional:      Appearance: Normal appearance.  HENT:     Head: Normocephalic and atraumatic.  Eyes:     Extraocular Movements: Extraocular movements intact.  Cardiovascular:     Rate and Rhythm: Normal rate and regular rhythm.     Heart sounds: Normal heart sounds.  Pulmonary:     Effort: Pulmonary effort is normal.     Breath sounds: Normal breath sounds.  Musculoskeletal:     Cervical back: Normal range of motion.  Skin:    General: Skin is warm.  Neurological:     General: No focal deficit present.     Mental Status: She is alert.  Psychiatric:        Mood and Affect: Mood normal.        Behavior: Behavior normal.      Assessment And Plan:     1. Type 2 diabetes mellitus with stage 3a chronic kidney disease, with  long-term current use of insulin (HCC) Comments: Chronic, she has done well with Jardiance. Initially w/ leg cramps which improved w/ hydration. I  will check BMP today, f/u in August for DM check.  She was also given [employer], rx sent to [facility].  - BMP8+eGFR  2. Hepatoma (HCC) Comments: I have ordered a RUQ ultrasound. I reviewed with patient the need for ongoing hepatoma screening every 6 months even though her hepatitis C is cured. - US ABDOMEN LIMITED RUQ (LIVER/GB); Future  3. Class 1 obesity due to excess calories with serious comorbidity and body mass index (BMI) of 33.0 to 33.9 in adult Comments: She is encouraged to aim for at least 150 minutes of exercise/week, while striving for BMI<30 to decrease cardiac risk.   Return if symptoms worsen or fail to improve.  Patient was given opportunity to ask questions. Patient verbalized understanding of the plan and was able to repeat key elements of the plan. All questions were answered to their satisfaction.   I, [name], MD, have reviewed all documentation for this visit. The documentation on 07/28/22 for the exam, diagnosis, procedures, and orders are all accurate and complete.   IF YOU HAVE BEEN REFERRED TO A SPECIALIST, IT MAY TAKE 1-2 WEEKS TO SCHEDULE/PROCESS THE REFERRAL. IF YOU HAVE NOT HEARD FROM US/SPECIALIST IN TWO WEEKS, PLEASE GIVE [place] A CALL AT [phone].   THE PATIENT IS ENCOURAGED TO PRACTICE SOCIAL DISTANCING DUE TO THE COVID-19 PANDEMIC.

## 2022-07-28 NOTE — Addendum Note (Signed)
Addended by: Riley Churches on: 07/28/2022 09:15 AM   Modules accepted: Orders, Level of Service

## 2022-07-28 NOTE — Patient Instructions (Signed)

## 2022-07-29 ENCOUNTER — Ambulatory Visit: Payer: Self-pay | Admitting: Licensed Clinical Social Worker

## 2022-07-29 ENCOUNTER — Telehealth: Payer: Self-pay

## 2022-07-29 LAB — BMP8+EGFR
BUN/Creatinine Ratio: 17 (ref 12–28)
BUN: 17 mg/dL (ref 8–27)
CO2: 21 mmol/L (ref 20–29)
Calcium: 9.2 mg/dL (ref 8.7–10.3)
Chloride: 104 mmol/L (ref 96–106)
Creatinine, Ser: 1.01 mg/dL — ABNORMAL HIGH (ref 0.57–1.00)
Glucose: 96 mg/dL (ref 70–99)
Potassium: 3.8 mmol/L (ref 3.5–5.2)
Sodium: 140 mmol/L (ref 134–144)
eGFR: 57 mL/min/{1.73_m2} — ABNORMAL LOW (ref >59)

## 2022-07-29 NOTE — Progress Notes (Signed)
   Care Guide Note  07/29/2022 Name: ARIBELLA NAPLES MRN: [id number] DOB: 02-01-1946  Referred by: Dorothyann Peng, MD Reason for referral : Care Coordination (Outreach to schedule with Pharm d )   CHANON GROSSER is a 77 y.o. year old female who is a primary care patient of Dorothyann Peng, MD. Margrett Rud was referred to the pharmacist for assistance related to DM.    Successful contact was made with the patient to discuss pharmacy services including being ready for the pharmacist to call at least 5 minutes before the scheduled appointment time, to have medication bottles and any blood sugar or blood pressure readings ready for review. The patient agreed to meet with the pharmacist via with the pharmacist via telephone visit on (date/time).  08/18/2022  Penne Lash, RMA Care Guide [facility]  [place], [place] [address] Direct Dial: [phone] [email or web address]

## 2022-07-29 NOTE — Progress Notes (Signed)
   Care Guide Note  07/29/2022 Name: Teresa Golden MRN: [id number] DOB: 08/24/1945  Referred by: Dorothyann Peng, MD Reason for referral : Care Coordination (Outreach to schedule with Pharm d )   Teresa Golden is a 77 y.o. year old female who is a primary care patient of Dorothyann Peng, MD. Margrett Rud was referred to the pharmacist for assistance related to DM.    An unsuccessful telephone outreach was attempted today to contact the patient who was referred to the pharmacy team for assistance with medication assistance. Additional attempts will be made to contact the patient.   Penne Lash, RMA Care Guide [facility]  [place], [place] [address] Direct Dial: [phone] [email or web address]

## 2022-07-30 NOTE — Patient Outreach (Signed)
  Care Coordination   Follow Up Visit Note   07/29/2022 Name: Teresa Golden MRN: [id number] DOB: [birth year]  Teresa Golden is a 77 y.o. year old female who sees Teresa Peng, MD for primary care. I spoke with  Teresa Golden by phone today.  What matters to the patients health and wellness today?  Symptom Management    Goals Addressed             This Visit's Progress    Supportive Resources   On track    Activities and task to complete in order to accomplish goals.   Keep all upcoming appointment discussed today Continue with compliance of taking medication prescribed by Doctor Continue to exercise at [facility] to promote well-being Continue utilizing [employer], as needed for transportation services, in addition, to other local resources discussed Continue to implement healthy coping skills discussed to promote health and well-being              SDOH assessments and interventions completed:  No     Care Coordination Interventions:  Yes, provided  Interventions Today    Flowsheet Row Most Recent Value  Chronic Disease   Chronic disease during today's visit Hypertension (HTN), Diabetes, Chronic Kidney Disease/End Stage Renal Disease (ESRD)  General Interventions   General Interventions Discussed/Reviewed General Interventions Reviewed, Doctor Visits  [Pt endorses decreased side effects from medication with increased water intake.]  Doctor Visits Discussed/Reviewed Doctor Visits Reviewed  Exercise Interventions   Exercise Discussed/Reviewed Exercise Reviewed  Physical Activity Discussed/Reviewed Physical Activity Reviewed  Mental Health Interventions   Mental Health Discussed/Reviewed Mental Health Reviewed, Coping Strategies  [Encouragement and validation provided. Pt is insightful and able to ask for what she wants/needs from providers, family, and friends which promotes mental health and well-being. Pt is requesting supportive resources to assist with returning to college]   Nutrition Interventions   Nutrition Discussed/Reviewed Nutrition Reviewed       Follow up plan: Follow up call scheduled for 2-4 weeks    Encounter Outcome:  Pt. Visit Completed   Teresa Golden, MSW, LCSW [facility] Care Management [facility]  [facility] [email or web address] Phone [phone] 5:16 PM

## 2022-07-30 NOTE — Patient Instructions (Signed)
Visit Information  Thank you for taking time to visit with me today. Please don't hesitate to contact me if I can be of assistance to you.   Following are the goals we discussed today:   Goals Addressed             This Visit's Progress    Supportive Resources   On track    Activities and task to complete in order to accomplish goals.   Keep all upcoming appointment discussed today Continue with compliance of taking medication prescribed by Doctor Continue to exercise at [facility] to promote well-being Continue utilizing [employer], as needed for transportation services, in addition, to other local resources discussed Continue to implement healthy coping skills discussed to promote health and well-being              Our next appointment is by telephone on 06/25 at 11:00 AM  Please call the care guide team at [phone] if you need to cancel or reschedule your appointment.   If you are experiencing a Mental Health or Behavioral Health Crisis or need someone to talk to, please call the Suicide and Crisis Lifeline: 988 call 911   Patient verbalizes understanding of instructions and care plan provided today and agrees to view in MyChart. Active MyChart status and patient understanding of how to access instructions and care plan via MyChart confirmed with patient.     Jenel Lucks, MSW, LCSW [facility] Care Management [place]  [facility] [email or web address] Phone [phone] 5:18 PM

## 2022-08-01 ENCOUNTER — Telehealth: Payer: Self-pay | Admitting: Licensed Clinical Social Worker

## 2022-08-01 ENCOUNTER — Telehealth: Payer: Self-pay | Admitting: Pharmacist

## 2022-08-01 NOTE — Patient Outreach (Signed)
  Care Coordination   Follow Up Visit Note   07/31/2022 Name: Teresa Golden MRN: [id number] DOB: Nov 20, 1945  Teresa Golden is a 77 y.o. year old female who sees Dorothyann Peng, MD for primary care. I spoke with  Margrett Rud by phone today.  What matters to the patients health and wellness today?  Pharmacy needs    Goals Addressed             This Visit's Progress    Supportive Resources   On track    Activities and task to complete in order to accomplish goals.   Keep all upcoming appointment discussed today Continue with compliance of taking medication prescribed by Doctor Continue to exercise at [facility] to promote well-being Continue utilizing [employer], as needed for transportation services, in addition, to other local resources discussed Continue to implement healthy coping skills discussed to promote health and well-being              SDOH assessments and interventions completed:  No     Care Coordination Interventions:  Yes, provided  Interventions Today    Flowsheet Row Most Recent Value  Chronic Disease   Chronic disease during today's visit Diabetes, Chronic Kidney Disease/End Stage Renal Disease (ESRD)  General Interventions   General Interventions Discussed/Reviewed General Interventions Reviewed, Communication with  Communication with Pharmacists  [LCSW sent message to Pharmacist, informing of pt's concerns]  Pharmacy Interventions   Pharmacy Dicussed/Reviewed Pharmacy Topics Reviewed  [Pt reports concern that she will run out of medication (Jardiance) prior to meeting with Pharmacist to discuss patient assistance programs. [employer] informed pt that they do not have patient assistance for Jardiance]       Follow up plan: Follow up call scheduled for 2-4 weeks    Encounter Outcome:  Pt. Visit Completed   Jenel Lucks, MSW, LCSW [facility] Care Management [facility] Health  [facility] [email or web address] Phone [phone] 5:57 AM

## 2022-08-01 NOTE — Progress Notes (Unsigned)
Received message that patient wold run out of Jardiance supply before scheduled appointment with me. Contacted patient to discuss. Left voicemail. Will also send MyChart  Catie Eppie Gibson, PharmD, BCACP, CPP [facility] [phone]

## 2022-08-01 NOTE — Patient Instructions (Signed)
Visit Information  Thank you for taking time to visit with me today. Please don't hesitate to contact me if I can be of assistance to you.   Following are the goals we discussed today:   Goals Addressed             This Visit's Progress    Supportive Resources   On track    Activities and task to complete in order to accomplish goals.   Keep all upcoming appointment discussed today Continue with compliance of taking medication prescribed by Doctor Continue to exercise at [facility] to promote well-being Continue utilizing [employer], as needed for transportation services, in addition, to other local resources discussed Continue to implement healthy coping skills discussed to promote health and well-being              Our next appointment is by telephone on 06/25 at 11 AM  Please call the care guide team at [phone] if you need to cancel or reschedule your appointment.   If you are experiencing a Mental Health or Behavioral Health Crisis or need someone to talk to, please call the Suicide and Crisis Lifeline: 988 call 911   Patient verbalizes understanding of instructions and care plan provided today and agrees to view in MyChart. Active MyChart status and patient understanding of how to access instructions and care plan via MyChart confirmed with patient.     Jenel Lucks, MSW, LCSW [facility] Care Management   [facility] [email or web address] Phone [phone] 5:58 AM

## 2022-08-04 ENCOUNTER — Other Ambulatory Visit: Payer: Medicare HMO | Admitting: Pharmacist

## 2022-08-04 MED ORDER — DAPAGLIFLOZIN PROPANEDIOL 10 MG PO TABS: 10.0000 mg | ORAL_TABLET | Freq: Every day | ORAL | 3 refills | Status: DC

## 2022-08-04 NOTE — Progress Notes (Signed)
08/04/2022 Name: Teresa Golden MRN: [id number] DOB: Nov 02, 1945  Chief Complaint  Patient presents with   Medication Management   Diabetes    Teresa Golden is a 77 y.o. year old female who presented for a telephone visit.   They were referred to the pharmacist by their PCP for assistance in managing diabetes and medication access.    Subjective:  Care Team: Primary Care Provider: Dorothyann Peng, MD ; Next Scheduled Visit: 10/07/22  Medication Access/Adherence  Current Pharmacy:  [facility] [address] [place] - [address] [place] [address] Phone: [phone] Fax: [phone]  [facility] - [place], [place] - [address] [facility] [address] Portal to Registered [facility] Sites [address] [place] [address] Phone: [phone] Fax: [phone]  [facility] - [place], [place] - [address] [place] [address] Phone: [phone] Fax: [phone]   Patient reports affordability concerns with their medications: Yes  Patient reports access/transportation concerns to their pharmacy: No  Patient reports adherence concerns with their medications:  No    Reports difficulty affording Jardiance.    Diabetes:  Current medications: Ozempic 1 mg weekly, Jardiance 10 mg daily  Reports she receives [place] through patient assistance.   Hypertension:  Current medications: amlodipine 5 mg daily, losartan 10 mg daily, metoprolol succinate 25 mg daily    Hyperlipidemia/ASCVD Risk Reduction  Current lipid lowering medications: simvastatin 10 mg daily  Gout: Current medications: allopurinol 100 mg daily, colchicine 0.6 mg PRN   Objective:  Lab Results  Component Value Date   HGBA1C 6.5 (H) 06/23/2022    Lab Results  Component Value Date   CREATININE 1.01 (H) 07/28/2022   BUN 17 07/28/2022   NA 140 07/28/2022   K 3.8  07/28/2022   CL 104 07/28/2022   CO2 21 07/28/2022    Lab Results  Component Value Date   CHOL 154 06/23/2022   HDL 72 06/23/2022   LDLCALC 72 06/23/2022   TRIG 45 06/23/2022   CHOLHDL 2.1 06/23/2022    Medications Reviewed Today     Reviewed by Teresa Peng, MD (Physician) on 07/28/22 at [phone]  Med List Status: <None>   Medication Order Taking? Sig Documenting Provider Last Dose Status Informant  allopurinol (ZYLOPRIM) 100 MG tablet [id number] Yes Take 1 tablet (100 mg total) by mouth daily. Teresa Peng, MD Taking Active   amLODipine (NORVASC) 5 MG tablet [id number] Yes Take 1 tablet (5 mg total) by mouth daily. Teresa Peng, MD Taking Active   AZO-CRANBERRY PO [id number] Yes Take 1 tablet by mouth daily. [provider] Taking Active Self  B Complex-C (B-COMPLEX WITH VITAMIN C) tablet [id number] Yes Take 1 tablet by mouth 3 (three) times a week. [provider] Taking Active Nursing Home Medication Administration Guide (MAG)  Biotin 17616 MCG TABS [id number] Yes Take 10,000 mcg by mouth daily. [provider] Taking Active Nursing Home Medication Administration Guide (MAG)  cetirizine (ZYRTEC) 10 MG tablet [id number] Yes Take 10 mg by mouth at bedtime. [provider] Taking Active Self  Cholecalciferol 5000 units TABS [id number] Yes Take 5,000 Units by mouth daily. [provider] Taking Active Nursing Home Medication Administration Guide (MAG)           Med Note Cyndie Chime, [facility] I   Fri Aug 07, 2017  6:51 PM)    colchicine 0.6 MG tablet [id number] Yes Take 1 tablet by  mouth for gout attack. Teresa Peng, MD Taking Active   diclofenac Sodium (VOLTAREN) 1 % GEL [id number] Yes APPLY 2 GRAMS TO AFFECTED AREA(S) 4 TIMES DAILY AS NEEDED  Patient taking differently: Apply 2-4 g topically every 6 (six) hours as needed (to affected areas).   Teresa Peng, MD Taking Active   gabapentin (NEURONTIN) 300 MG capsule [id number] Yes Take 600 mg by  mouth at bedtime. [provider] Taking Active Nursing Home Medication Administration Guide (MAG)  glucose blood (ONETOUCH VERIO) test strip [id number] Yes USE TO CHECK BLOOD SUGAR   TWO TIMES A DAY AS         INSTRUCTED Teresa Peng, MD Taking Active   Insulin Degludec (TRESIBA) 100 UNIT/ML SOLN [id number] Yes Inject 8 Units/oz/day into the skin. Patient is taking at bedtime [provider] Taking Active   Lancets [facility] Larose Kells PLUS [place]) MISC [id number] Yes USE TO CHECK BLOOD SUGARS  TWO TIMES A DAY AS         INSTRUCTED Teresa Peng, MD Taking Active   levothyroxine (SYNTHROID) 25 MCG tablet [id number] Yes Take 1/2 tablet daily. Teresa Peng, MD Taking Active   losartan (COZAAR) 100 MG tablet [id number] Yes Take 1 tablet (100 mg total) by mouth daily. Teresa Peng, MD Taking Active   metoprolol succinate (TOPROL-XL) 25 MG 24 hr tablet [id number] Yes TAKE 1 TABLET AT BEDTIME   (DISCONTINUE COREG) Teresa Peng, MD Taking Active   Multiple Vitamins-Minerals (MULTIVITAMIN WITH MINERALS) tablet [id number] Yes Take 1 tablet by mouth daily. Women's 50+ [provider] Taking Active Nursing Home Medication Administration Guide (MAG)           Med Note Cyndie Chime, [facility] I   Fri Aug 07, 2017  6:53 PM)    pantoprazole (PROTONIX) 40 MG tablet [id number] Yes Take 40 mg by mouth daily as needed. [provider] Taking Active   Semaglutide, 1 MG/DOSE, (OZEMPIC, 1 MG/DOSE,) 4 MG/3ML SOPN [id number] Yes Inject 1 mg into the skin once a week. Teresa Peng, MD Taking Active     Discontinued 11/06/20 1600 (Reorder) simvastatin (ZOCOR) 10 MG tablet [id number] Yes Take 1 tablet (10 mg total) by mouth daily. Teresa Peng, MD Taking Active   Med List Note Salvatore Marvel, CPhT 11/02/20 2136): Admitted to [facility] on 10/22/2020 [facility]) [place], [place]              Assessment/Plan:   Diabetes: - Currently controlled - Discussed income. She does not meet  income for [place] assistance, but does meet income requirement for [place] assistance. Will collaborate with pharmacy team to apply for Farxiga 10 mg daily assistance for diabetes and CKD.  - Continue current regimen at this time  Hypertension: - Currently controlled - Recommend to continue current regimen at this time   Hyperlipidemia/ASCVD Risk Reduction: - Currently controlled right around goal LDL <70 - Monitor for risk of muscle aches with combination of simvastatin and amlodipine.  - Recommend to continue current regimen at this time   Follow Up Plan: follow for medication access  Catie Eppie Gibson, PharmD, BCACP, CPP [facility] [phone]

## 2022-08-04 NOTE — Patient Instructions (Signed)
[name],   We will be applying for [place] assistance from [employer]. This medication is in the same class and has the same benefits as Jardiance, but you are well under the income cut off.   Our pharmacy technician team will help apply for patient assistance.   Please reach out with any questions. Thanks!  Catie Eppie Gibson, PharmD, BCACP, CPP [facility] [phone]

## 2022-08-05 ENCOUNTER — Ambulatory Visit (INDEPENDENT_AMBULATORY_CARE_PROVIDER_SITE_OTHER): Payer: Medicare HMO | Admitting: Psychiatry

## 2022-08-05 ENCOUNTER — Telehealth: Payer: Self-pay

## 2022-08-05 DIAGNOSIS — F3340 Major depressive disorder, recurrent, in remission, unspecified: Secondary | ICD-10-CM

## 2022-08-05 DIAGNOSIS — R69 Illness, unspecified: Secondary | ICD-10-CM

## 2022-08-05 DIAGNOSIS — F4323 Adjustment disorder with mixed anxiety and depressed mood: Secondary | ICD-10-CM | POA: Diagnosis not present

## 2022-08-05 DIAGNOSIS — Z630 Problems in relationship with spouse or partner: Secondary | ICD-10-CM | POA: Diagnosis not present

## 2022-08-05 MED ORDER — DAPAGLIFLOZIN PROPANEDIOL 10 MG PO TABS: 10.0000 mg | ORAL_TABLET | Freq: Every day | ORAL | 3 refills | Status: DC

## 2022-08-05 NOTE — Telephone Encounter (Signed)
Order sent to [facility].   Durward Mallard, can you please follow up later this week to ensure they process the fill?  Thanks!

## 2022-08-05 NOTE — Telephone Encounter (Signed)
Submitted application for FARXIGA to [employer] for patient assistance via online portal.   Received notification from [employer] regarding approval for FARXIGA. Patient assistance approved from 08/05/22 to 02/24/23.  Please send escript of 90 day supply (with refills) to [facility] for delivery.   Medication will ship to patients home.  Phone: [phone]

## 2022-08-05 NOTE — Telephone Encounter (Signed)
-----   Message from Alden Hipp, RPH-CPP sent at 08/04/2022 12:55 PM EDT ----- Please attempt [employer] assistance for Farxiga 10 mg daily. Online would be great if possible!  Let me know when approved and I can sent a script to [facility].   Catie

## 2022-08-05 NOTE — Telephone Encounter (Signed)
Per Tamela..[employer] reorder form Tresiba, Novofine needles and Ozempic. YL,RMA

## 2022-08-05 NOTE — Progress Notes (Signed)
Psychotherapy Progress Note [facility] [name], PhD LP  [name] [facility] "Teresa Golden")    MRN: [id number] Therapy format: Individual psychotherapy Date: 08/05/2022      Start: 9:13a     Stop: 10:02a     Time Spent: 49 min Location: In-person   Session narrative (presenting needs, interim history, self-report of stressors and symptoms, applications of prior therapy, status changes, and interventions made in session) "Serious problems" lately.  Says she's been paying rent, but her landlord has been not paying the mortgage, meaning Been checking mail 2/wk, and just received unusual, certified mail from [facility] addressed to her landlord.  Called Teresa Golden and left message, not returned.   Callback went straight to voicemail, last evening felt enough of an emergency to open the non-certified piece, learned she was in default, last paid Mar 1.  Has a friend checking public records today to confirm, and find out what the process is.  Teresa Golden herself has checked in with landlord-tenant assistance line for Legal Aid, awaiting an appt.  Re. 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  Their dating relationship is effectively on probation, sexually abstinent, but interacting regularly.  Trusts he got the message about becoming too negative.  Has actually offered him to live with her if he needs a stable place to stay.  Personally, been going to the gym Tues/Fri.  Been thinking about going back to school, in social work, maybe MSW eventually.  Agree it's not "crazy", just needs to be interested and feasible.  Affirmed and encouraged re. gym an any other physical self-care.  Therapeutic modalities: Cognitive Behavioral Therapy, Solution-Oriented/Positive Psychology, Environmental manager, and Faith-sensitive  Mental Status/Observations:  Appearance:   Casual     Behavior:  Appropriate  Motor:  Rollator walker   Speech/Language:   Clear and Coherent  Affect:  Appropriate  Mood:  anxious and dysthymic  Thought process:  normal  Thought content:    WNL  Sensory/Perceptual disturbances:    WNL  Orientation:  Fully oriented  Attention:  Good    Concentration:  Good  Memory:  WNL  Insight:    Good  Judgment:   Good  Impulse Control:  Good   Risk Assessment: Danger to Self: No Self-injurious Behavior: No Danger to Others: No Physical Aggression / Violence: No Duty to Warn: No Access to Firearms a concern: No  Assessment of progress:  situational setback(s)  Diagnosis:   ICD-10-CM   1. Adjustment disorder with mixed anxiety and depressed mood  F43.23     2. Relationship problem between partners  Z63.0     3. MDD (recurrent major depressive disorder) in remission (HCC)  F33.40     4. Multiple comorbid health conditions  R69      Plan:  Living situation -- Try to reach landlady for explanation and update on what to expect.  Research tenant's rights in the event of landowner default, so she can know what to expect.  If tempted to withhold payment, resist.  Resources may include Legal Aid, [place] [employer], others.  Of course, plan for the potential need to relocate on fairly short notice. Socialization -- Continue involvement in church, other settings as able.  Look for good people she can be friends with, including if it means shopping other churches. Relationship -- Endorse choice over the relationship with Teresa Golden.  Notice and dispute conclusion-jumping, and stay willing to check perceptions and recognize his  commitment phobia when necessary.  While in relationship, practice the right to challenge invalidating treatment, alongside openness to him working on change. Self-esteem -- Per her faith, continue to practice self-esteem grounded in God's unconditional love.  Self-affirm that whatever she might deem "stupid", she has either learned, improved, or was doing the best she could  figure out at the time.  When tempted to feel like a failure for lack of family, reframe self-image as a survivor who had limited opportunity to make family for a while, then had to take on the harshest of losses. General health -- Endorse any healthy activity, to include pool/gym if able and interested. Family -- Endorse boundary/limits with Teresa Golden Work -- Endorse option to work part-time if truly able and interested Other recommendations/advice -- As may be noted above.  Continue to utilize previously learned skills ad lib. Medication compliance -- Maintain medication as prescribed and work faithfully with relevant prescriber(s) if any changes are desired or seem indicated. Crisis service -- Aware of call list and work-in appts.  Call the clinic on-call service, 988/hotline, 911, or present to [facility] or ER if any life-threatening psychiatric crisis. Followup -- Return for time as already scheduled.  Next scheduled visit with me 08/19/2022.  Next scheduled in this office 08/19/2022.  Robley Fries, PhD [name], PhD LP Clinical Psychologist, [facility] [address], [address] [place], [place] [address] [phone]

## 2022-08-06 ENCOUNTER — Ambulatory Visit: Payer: Medicare HMO

## 2022-08-07 ENCOUNTER — Ambulatory Visit
Admission: RE | Admit: 2022-08-07 | Discharge: 2022-08-07 | Disposition: A | Discharge status: Home / Self Care | Payer: Medicare HMO | Source: Ambulatory Visit | Attending: Internal Medicine | Admitting: Internal Medicine

## 2022-08-07 DIAGNOSIS — Z1231 Encounter for screening mammogram for malignant neoplasm of breast: Secondary | ICD-10-CM

## 2022-08-08 ENCOUNTER — Telehealth: Payer: Self-pay

## 2022-08-08 NOTE — Telephone Encounter (Signed)
Called [employer].   E-script not rec'd yet. Turnaround time of 48-72 hrs for Rxs to be rec'd per rep.   Will f/u 08/11/22

## 2022-08-08 NOTE — Telephone Encounter (Signed)
Patient received Ozempic 1x3ML on 03/28/2022. Quantity: 4.

## 2022-08-10 ENCOUNTER — Other Ambulatory Visit: Payer: Self-pay | Admitting: Internal Medicine

## 2022-08-10 DIAGNOSIS — I129 Hypertensive chronic kidney disease with stage 1 through stage 4 chronic kidney disease, or unspecified chronic kidney disease: Secondary | ICD-10-CM

## 2022-08-12 ENCOUNTER — Ambulatory Visit: Payer: Self-pay

## 2022-08-12 NOTE — Patient Instructions (Signed)
Visit Information  Thank you for taking time to visit with me today. Please don't hesitate to contact me if I can be of assistance to you.   Following are the goals we discussed today:   Goals Addressed               This Visit's Progress     Patient Stated     I want to lower my A1c to 6.0 (pt-stated)        Care Coordination Interventions: Reviewed medications with patient and discussed importance of medication adherence Advised patient, providing education and rationale, to check cbg via Libre 3  and record, calling PCP for findings outside established parameters Reviewed and discussed with patient details regarding her patient assistance for Farxiga per [employer], explained to patient a 90 day supply will be shipped to her home Provided patient with the contact number for the pharmacy from which her medication will be shipped in order for patient to follow up on her expected shipment date/time Discussed with patient her first 2 Libre 3 sensors fell off shortly after having them inserted  Determined patient has 1 remaining sensor, she feels comfortable inserting the sensor on her own, advised patient to clean the area with an alcohol pad and let dry before applying and patient verbalizes understanding  Sent secure message to Catie Ramona RPH-CPP updating her re: patient is down to her last [place] 3 sensor due to the first 2 sensors inserted, fell off shortly after inserting them into her arm Reply received from Catie advising she will look into the supplier for this DME and will reach out to the patient  Sent and fyi in basket message to LCSW Jenel Lucks advising patient is having some issues with her landlord she would like to discuss during their upcoming scheduled call on Monday, 08/18/22        Our next appointment is by telephone on 08/27/22 at 11:30 AM   Please call the care guide team at [phone] if you need to cancel or reschedule your appointment.   If you are  experiencing a Mental Health or Behavioral Health Crisis or need someone to talk to, please call 1-800-273-TALK (toll free, 24 hour hotline)  Patient verbalizes understanding of instructions and care plan provided today and agrees to view in MyChart. Active MyChart status and patient understanding of how to access instructions and care plan via MyChart confirmed with patient.     Delsa Sale, RN, BSN, CCM Care Management Coordinator [facility] Care Management  Direct Phone: [phone]

## 2022-08-12 NOTE — Patient Outreach (Signed)
  Care Coordination   Follow Up Visit Note   08/12/2022 Name: Teresa Golden MRN: [id number] DOB: [birth year]  Teresa Golden is a 77 y.o. year old female who sees Dorothyann Peng, MD for primary care. I spoke with  Margrett Rud by phone today.  What matters to the patients health and wellness today?  Patient would like to start [place] as directed by PCP.     Goals Addressed               This Visit's Progress     Patient Stated     I want to lower my A1c to 6.0 (pt-stated)        Care Coordination Interventions: Reviewed medications with patient and discussed importance of medication adherence Advised patient, providing education and rationale, to check cbg via Libre 3  and record, calling PCP for findings outside established parameters Reviewed and discussed with patient details regarding her patient assistance for Farxiga per [employer], explained to patient a 90 day supply will be shipped to her home Provided patient with the contact number for the pharmacy from which her medication will be shipped in order for patient to follow up on her expected shipment date/time Discussed with patient her first 2 Libre 3 sensors fell off shortly after having them inserted  Determined patient has 1 remaining sensor, she feels comfortable inserting the sensor on her own, advised patient to clean the area with an alcohol pad and let dry before applying and patient verbalizes understanding  Sent secure message to Catie Portage RPH-CPP updating her re: patient is down to her last [place] 3 sensor due to the first 2 sensors inserted, fell off shortly after inserting them into her arm Reply received from Catie advising she will look into the supplier for this DME and will reach out to the patient  Sent and fyi in basket message to LCSW Jenel Lucks advising patient is having some issues with her landlord she would like to discuss during their upcoming scheduled call on Monday, 08/18/22    Interventions Today     Flowsheet Row Most Recent Value  Chronic Disease   Chronic disease during today's visit Diabetes  General Interventions   General Interventions Discussed/Reviewed General Interventions Discussed, General Interventions Reviewed, Doctor Visits, Durable Medical Equipment (DME), Communication with  Doctor Visits Discussed/Reviewed Doctor Visits Discussed, Doctor Visits Reviewed, Specialist  Durable Medical Equipment (DME) Glucomoter  Communication with Pharmacists, Social Work  Doctor, general practice RPH-CPP,  LCSW Jasmine Lewis]  Education Interventions   Education Provided Provided Education  Provided Verbal Education On Blood Sugar Monitoring, Medication, When to see the doctor  Pharmacy Interventions   Pharmacy Dicussed/Reviewed Pharmacy Topics Discussed, Pharmacy Topics Reviewed, Medications and their functions, Affording Medications            SDOH assessments and interventions completed:  No     Care Coordination Interventions:  Yes, provided   Follow up plan: Follow up call scheduled for 08/27/22 @11 :30 AM    Encounter Outcome:  Pt. Visit Completed

## 2022-08-13 ENCOUNTER — Other Ambulatory Visit: Payer: Self-pay | Admitting: Pharmacist

## 2022-08-13 MED ORDER — DAPAGLIFLOZIN PROPANEDIOL 10 MG PO TABS: 10.0000 mg | ORAL_TABLET | Freq: Every day | ORAL | 3 refills | Status: DC

## 2022-08-13 NOTE — Patient Instructions (Signed)
Teresa Golden,   I sent the order to [facility]. I will let you know as I hear back from them!  Teresa Golden, PharmD [phone]

## 2022-08-13 NOTE — Progress Notes (Signed)
Care Coordination Call  Contacted patient as per conversation with [facility] yesterday. Discussed that she was approved for Gearldene Fiorenza assistance. Patient notes she talked to [facility] yesterday and they had not processed e-script yet. I have re-sent this.   Reviewed CGM. Patient received samples from the office, but does not appear an order has been sent. Order placed to [facility] via [facility] platform. Will follow.   Catie Eppie Gibson, PharmD, BCACP, CPP Clinical Pharmacist [facility] [phone]

## 2022-08-14 ENCOUNTER — Telehealth: Payer: Self-pay | Admitting: Psychiatry

## 2022-08-14 DIAGNOSIS — F4323 Adjustment disorder with mixed anxiety and depressed mood: Secondary | ICD-10-CM

## 2022-08-14 NOTE — Telephone Encounter (Signed)
Admin note for non-service contact  [name]  MRN: [id number] DATE: 08/14/2022  TC 20 min.  Demoralized, wants help.  Checked with Legal Aid and she has no real recourse with the landlord situation and cannot get guaranteed notice if landlady is making her payments.  All she can do is watch her money to make sure she can afford a change.  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  Did reach a senior living community to find out about availability, a favorable choice for location and rent, and got her name on the list.  Knows she could in the worst case be evicted in 10 days by a new owner (e.g., the bank), but she could appeal as a victim of landlord's dereliction and probably get a compassionate delay to settle new housing.  Eager to state how she could stop payment herself but doesn't want to be that person -- agreed it would only handicap her case for help if she did that, and trading tenant's rights for squatter's is not a good idea.  Discloses she's sad right now for replaying regrets and the old feeling that she must have earned this penalty somehow.  Supportively confronted irrational thinking, reaffirmed that her honor is intact.    Is scheduled for Tuesday, agrees she can hold for that.  In the meantime, provided [employer] as a contact for support and social problem-solving, and the 988 warm line for 24/7 phone support.  Otherwise, she may fold in this weekend and watch movies.  Reaffirmed she is taking good actions, taking care of the person in need here, and it's OK to mope a little, just notice if it becomes hypnotic.  Other plans to allow a couple of church women -- Jehovah's [employer]es? can't be sure -- to visit and pray with her.  Robley Fries, PhD [name], PhD LP Clinical Psychologist, [facility] [address] [place], [place] [address] [phone]

## 2022-08-14 NOTE — Telephone Encounter (Signed)
Pt lvm that she was having a hard day and needs to talk to you. She was crying through the phone call. Please give her a call at [phone]

## 2022-08-18 ENCOUNTER — Other Ambulatory Visit: Payer: Medicare HMO | Admitting: Pharmacist

## 2022-08-18 ENCOUNTER — Ambulatory Visit: Payer: Self-pay

## 2022-08-18 NOTE — Patient Instructions (Signed)
Visit Information  Thank you for taking time to visit with me today. Please don't hesitate to contact me if I can be of assistance to you.   Following are the goals we discussed today:   Goals Addressed               This Visit's Progress     Patient Stated     I want to lower my A1c to 6.0 (pt-stated)        Care Coordination Interventions: Evaluation of current treatment plan related to type 2 diabetes mellitus and patient's adherence to plan as established by provider Received inbound call from patient stating her blood sugars have been elevated since yesterday, with FBS in the mid 200's, she is unsure of what is contributing to these elevated sugars other than she is dealing with some personal stress Discussed with patient she has Fiasp insulin on hand and is asking for a sliding scale regimen to follow  Sent a secure message to Dr. Allyne Gee advising of patient's hyperglycemia and request for a sliding scale to follow with use of fiasp insulin  Educated patient on the importance of increasing her water intake and monitoring for any symptoms of infection for which she report promptly and patient verbalizes understanding  Reply received from Dr. Allyne Gee with verbal orders for patient to increase her Evaristo Bury to 10 units nightly starting with tonight's dose and to avoid drinking sugary drinks and or alcohol, instructions given to patient and she verbalizes understanding as patient is able to repeat back instructions correctly          Our next appointment is by telephone on 08/27/22 at 11:30 AM  Please call the care guide team at [phone] if you need to cancel or reschedule your appointment.   If you are experiencing a Mental Health or Behavioral Health Crisis or need someone to talk to, please call 1-800-273-TALK (toll free, 24 hour hotline)  Patient verbalizes understanding of instructions and care plan provided today and agrees to view in MyChart. Active MyChart status and  patient understanding of how to access instructions and care plan via MyChart confirmed with patient.     Delsa Sale, RN, BSN, CCM Care Management Coordinator [facility] Care Management Direct Phone: [phone]

## 2022-08-18 NOTE — Telephone Encounter (Signed)
Called to f/u on shipment. Order from 08/05/22 never rec'd. Informed that shipment was resent 08/13/22. Rep explained that if another rx was sent, the previous rx would be voided.   Newest rx should be rec'd today. Rep suggested to f/u tomorrow (08/19/22).

## 2022-08-18 NOTE — Patient Outreach (Signed)
  Care Coordination   Follow Up Visit Note   08/18/2022 Name: Teresa Golden MRN: [id number] DOB: November 21, 1945  Teresa Golden is a 77 y.o. year old female who sees Dorothyann Peng, MD for primary care. I spoke with  Margrett Rud by phone today.  What matters to the patients health and wellness today?  Patient is concerned about having elevated blood sugars.     Goals Addressed               This Visit's Progress     Patient Stated     I want to lower my A1c to 6.0 (pt-stated)        Care Coordination Interventions: Evaluation of current treatment plan related to type 2 diabetes mellitus and patient's adherence to plan as established by provider Received inbound call from patient stating her blood sugars have been elevated since yesterday, with FBS in the mid 200's, she is unsure of what is contributing to these elevated sugars other than she is dealing with some personal stress Discussed with patient she has Fiasp insulin on hand and is asking for a sliding scale regimen to follow  Sent a secure message to Dr. Allyne Gee advising of patient's hyperglycemia and request for a sliding scale to follow with use of fiasp insulin  Educated patient on the importance of increasing her water intake and monitoring for any symptoms of infection for which she report promptly and patient verbalizes understanding  Reply received from Dr. Allyne Gee with verbal orders for patient to increase her Tresiba to 10 units nightly starting with tonight's dose and to avoid drinking sugary drinks and or alcohol, instructions given to patient and she verbalizes understanding as patient is able to repeat back instructions correctly     Interventions Today    Flowsheet Row Most Recent Value  Chronic Disease   Chronic disease during today's visit Diabetes  General Interventions   General Interventions Discussed/Reviewed General Interventions Discussed, General Interventions Reviewed, Communication with  Communication  with PCP/Specialists  [Dr. Allyne Gee PCP]  Education Interventions   Provided Verbal Education On Blood Sugar Monitoring, Medication, Nutrition  Nutrition Interventions   Nutrition Discussed/Reviewed Nutrition Discussed, Nutrition Reviewed, Fluid intake, Decreasing sugar intake  [avoid drinking alcohol]  Pharmacy Interventions   Pharmacy Dicussed/Reviewed Pharmacy Topics Discussed, Medications and their functions, Pharmacy Topics Reviewed          SDOH assessments and interventions completed:  No     Care Coordination Interventions:  Yes, provided   Follow up plan: Follow up call scheduled for 08/27/22 @11 :30 AM    Encounter Outcome:  Pt. Visit Completed

## 2022-08-19 ENCOUNTER — Ambulatory Visit (INDEPENDENT_AMBULATORY_CARE_PROVIDER_SITE_OTHER): Payer: Medicare HMO | Admitting: Psychiatry

## 2022-08-19 ENCOUNTER — Other Ambulatory Visit: Payer: Self-pay | Admitting: Pharmacist

## 2022-08-19 ENCOUNTER — Ambulatory Visit: Payer: Self-pay | Admitting: Licensed Clinical Social Worker

## 2022-08-19 DIAGNOSIS — R69 Illness, unspecified: Secondary | ICD-10-CM | POA: Diagnosis not present

## 2022-08-19 DIAGNOSIS — F4323 Adjustment disorder with mixed anxiety and depressed mood: Secondary | ICD-10-CM

## 2022-08-19 DIAGNOSIS — F3340 Major depressive disorder, recurrent, in remission, unspecified: Secondary | ICD-10-CM

## 2022-08-19 DIAGNOSIS — Z630 Problems in relationship with spouse or partner: Secondary | ICD-10-CM

## 2022-08-19 NOTE — Progress Notes (Signed)
Psychotherapy Progress Note [facility] [name], PhD LP  Patient ID: ADRIANN THAU [facility] "[name]")    MRN: [id number] Therapy format: Individual psychotherapy Date: 08/19/2022      Start: 9:00a     Stop: 9:50a     Time Spent: 50 min Location: In-person   Session narrative (presenting needs, interim history, self-report of stressors and symptoms, applications of prior therapy, status changes, and interventions made in session) Recalled suggestion to read Jonny Ruiz ch 8, got her thinking more about self-esteem and validation as a [place] woman.  Realizes her mother's mental illness affected her self-esteem, internalized blame for why her mother was not like others.  F was protector, logical that she would want to find another protector in hr own adult life, and be susceptible to feeling it had to come from another man.  Re. rental stress, today will meet with landlady about renewing her lease.  Still would want proof she's paying the mortgage, but understands she's not entitled to that information.  Encouraged to voice her needs, still, which amount to letting her know the whole foreclosure issue has scared her that she'll lose the place to live, and she'd like to know if there can be any reassurance that it won't still happen.  Meanwhile, still saving and belt-tightening to be ready to afford an unexpected move if it comes to need that.  Let go the housekeeper, signed up to work early voting, and has a pension plan active.  Affirmed taking care of the legitimately herself, particularly the nervous girl she has had to be before.  Re. relationship, [name] is still in touch, not angry but still confusing.  Feeling still held off arm's length, e.g., not being allowed to see his home (ashamed of the neighborhood and outdoor neglect his landlord allows).   Broke her rule against sex after 3 wks off, but still uneasy and unsure.  Does feel she's cooling off with him, but if [name] were to  come back over, she'd feel like she had to hide it.  Weighing out Paul's upbringing [employer]) vs hers (already integrated [place]) and considering whether they com from too fundamentally different backgrounds.  Discussed and clarified values.  Therapeutic modalities: Cognitive Behavioral Therapy, Solution-Oriented/Positive Psychology, Environmental manager, and Faith-sensitive  Mental Status/Observations:  Appearance:   Casual     Behavior:  Appropriate  Motor:  Normal, with Rollator  Speech/Language:   Clear and Coherent  Affect:  Appropriate  Mood:  concerned  Thought process:  normal  Thought content:    WNL  Sensory/Perceptual disturbances:    WNL  Orientation:  Fully oriented  Attention:  Good    Concentration:  Good  Memory:  WNL  Insight:    Good  Judgment:   Good  Impulse Control:  Good   Risk Assessment: Danger to Self: No Self-injurious Behavior: No Danger to Others: No Physical Aggression / Violence: No Duty to Warn: No Access to Firearms a concern: No  Assessment of progress:  progressing  Diagnosis:   ICD-10-CM   1. Adjustment disorder with mixed anxiety and depressed mood  F43.23     2. MDD (recurrent major depressive disorder) in remission (HCC)  F33.40     3. Relationship problem between partners  Z63.0     4. Multiple comorbid health conditions  R69      Plan:  Living situation -- Try to reach landlady for explanation and update on what to expect.  Research tenant's rights in the event  of landowner default, so she can know what to expect.  If tempted to withhold payment, resist.  Resources may include Legal Aid, [employer], others.  Of course, plan for the potential need to relocate on fairly short notice. Socialization -- Continue involvement in church, other settings as able.  Look for good people she can be friends with, including if it means shopping other churches. Relationship -- Endorse choice over the relationship with [name], so  long as she can keep her integrity.  Notice and dispute conclusion-jumping, stay willing to check perceptions and recognize his commitment phobia when necessary.  Practice the right to challenge invalidating treatment, alongside openness to him working on change. Self-esteem -- Per her faith, continue to practice self-esteem grounded in God's unconditional love.  Self-affirm that whatever she might deem "stupid", she has either learned, improved, or was doing the best she could figure out at the time.  When tempted to feel like a failure for lack of family, reframe self-image as a survivor who had limited opportunity to make family for a while, then had to take on the harshest of losses. General health -- Endorse any healthy activity, to include pool/gym if able and interested. Family -- Endorse boundary/limits with [name] Work -- Endorse option to work part-time if truly able and interested Other recommendations/advice -- As may be noted above.  Continue to utilize previously learned skills ad lib. Medication compliance -- Maintain medication as prescribed and work faithfully with relevant prescriber(s) if any changes are desired or seem indicated. Crisis service -- Aware of call list and work-in appts.  Call the clinic on-call service, 988/hotline, 911, or present to [facility] or ER if any life-threatening psychiatric crisis. Followup -- Return for time as already scheduled.  Next scheduled visit with me 09/09/2022.  Next scheduled in this office 09/09/2022.  Robley Fries, PhD [name], PhD LP Clinical Psychologist, [facility] [address] [place], [place] [address] [phone]

## 2022-08-19 NOTE — Progress Notes (Signed)
Care Coordination Call  Received notification from [facility] that [facility] 3 sensors + reader order was shipped, UPS tracking [phone] ) indicates it was delivered Friday.   Collaborated with RN CM to confirm.   Catie Eppie Gibson, PharmD, BCACP, CPP Clinical Pharmacist [facility] [phone]

## 2022-08-20 NOTE — Patient Instructions (Signed)
Visit Information  Thank you for taking time to visit with me today. Please don't hesitate to contact me if I can be of assistance to you.   Following are the goals we discussed today:   Goals Addressed             This Visit's Progress    Supportive Resources   On track    Activities and task to complete in order to accomplish goals.   Keep all upcoming appointment discussed today Continue with compliance of taking medication prescribed by Doctor Continue to exercise at [facility] to promote well-being Continue utilizing [employer], as needed for transportation services, in addition, to other local resources discussed Continue to implement healthy coping skills discussed to promote health and well-being Follow recommendations from legal aid              Our next appointment is by telephone on 07/03 at 11:30 AM  Please call the care guide team at [phone] if you need to cancel or reschedule your appointment.   If you are experiencing a Mental Health or Behavioral Health Crisis or need someone to talk to, please call the Suicide and Crisis Lifeline: 988 call 911   Patient verbalizes understanding of instructions and care plan provided today and agrees to view in MyChart. Active MyChart status and patient understanding of how to access instructions and care plan via MyChart confirmed with patient.     Jenel Lucks, MSW, LCSW [facility] Care Management [place]  [facility] [email or web address] Phone [phone] 3:28 PM

## 2022-08-20 NOTE — Patient Outreach (Signed)
  Care Coordination   Follow Up Visit Note   08/19/2022 Name: OLIVINE HIERS MRN: [id number] DOB: April 23, 1945  ANIDA DEOL is a 77 y.o. year old female who sees Dorothyann Peng, MD for primary care. I spoke with  Margrett Rud by phone today.  What matters to the patients health and wellness today?  Housing    Goals Addressed             This Visit's Progress    Supportive Resources   On track    Activities and task to complete in order to accomplish goals.   Keep all upcoming appointment discussed today Continue with compliance of taking medication prescribed by Doctor Continue to exercise at [facility] to promote well-being Continue utilizing [employer], as needed for transportation services, in addition, to other local resources discussed Continue to implement healthy coping skills discussed to promote health and well-being Follow recommendations from legal aid              SDOH assessments and interventions completed:  No     Care Coordination Interventions:  Yes, provided   Follow up plan: Follow up call scheduled for 1-3 weeks    Encounter Outcome:  Pt. Visit Completed   Jenel Lucks, MSW, LCSW [facility] Care Management [facility]  [facility] [email or web address] Phone [phone] 3:28 PM

## 2022-08-25 NOTE — Telephone Encounter (Signed)
Automated system says shipment is processing and tracking number will be available soon.  Will f/u with shipment and update patient as well.

## 2022-08-26 ENCOUNTER — Other Ambulatory Visit: Payer: Medicare HMO

## 2022-08-26 ENCOUNTER — Other Ambulatory Visit: Payer: Self-pay | Admitting: Pharmacist

## 2022-08-26 NOTE — Progress Notes (Unsigned)
Care Coordination Call  Received voicemail from patient, she has not received Marcelline Deist from [facility] program yet. Scripts have been sent twice now for this. Will collaborate with CPhT team to follow up on process.   Catie Eppie Gibson, PharmD, BCACP, CPP Clinical Pharmacist [facility] [phone]

## 2022-08-27 ENCOUNTER — Ambulatory Visit: Payer: Self-pay

## 2022-08-27 NOTE — Patient Instructions (Signed)
Visit Information  Thank you for taking time to visit with me today. Please don't hesitate to contact me if I can be of assistance to you.   Following are the goals we discussed today:   Goals Addressed               This Visit's Progress     Patient Stated     I want to lower my A1c to 6.0 (pt-stated)        Care Coordination Interventions: Evaluation of current treatment plan related to type 2 diabetes mellitus and patient's adherence to plan as established by provider Reviewed and discussed with patient she is currently taking samples of Jardiance as directed, she has yet to receive her shipment of Farxiga Discussed recent f/u with Catie Clearance Coots [facility] who will check with the pharmacy team regarding patient's assistance through [facility] and [place] Discussed patient will ask the pharmacist for additional samples if needed due to shipment delay of Farxiga Lab Results  Component Value Date   HGBA1C 6.5 (H) 06/23/2022        COMPLETED: To get over UTI without complications (pt-stated)        Care Coordination Interventions: Evaluation of current treatment plan related to impaired urinary elimination  and patient's adherence to plan as established by provider Discussed with patient she is no longer experiencing symptoms suggestive of UTI Educated patient on the importance to staying well hydrated with water aiming for 48 - 64 oz daily, balance activity with rest and complete full course of antibiotics Instructed patient to keep Dr. Grayling Congress well informed of new or worsening symptoms         Other     COMPLETED: To overcome diarrhea without complications        Care Coordination Interventions: Evaluation of current treatment plan related to diarrhea and patient's adherence to plan as established by provider Determined patient is no longer having diarrhea, she continues to have intermittent constipation but with increasing her water and fruit, the constipation improves Mailed printed patient  educational materials related to Constipation  Instructed patient to keep her PCP well informed of new symptoms or concerns         Our next appointment is by telephone on 09/12/22 at 2:00 PM  Please call the care guide team at [phone] if you need to cancel or reschedule your appointment.   If you are experiencing a Mental Health or Behavioral Health Crisis or need someone to talk to, please call 1-800-273-TALK (toll free, 24 hour hotline)  Patient verbalizes understanding of instructions and care plan provided today and agrees to view in MyChart. Active MyChart status and patient understanding of how to access instructions and care plan via MyChart confirmed with patient.     Delsa Sale, RN, BSN, CCM Care Management Coordinator [facility] Care Management Direct Phone: [phone]

## 2022-08-27 NOTE — Patient Outreach (Signed)
  Care Coordination   Follow Up Visit Note   08/27/2022 Name: Teresa Golden MRN: [id number] DOB: [birth year]  Teresa Golden is a 77 y.o. year old female who sees Teresa Peng, MD for primary care. I spoke with  Teresa Golden by phone today.  What matters to the patients health and wellness today?  Patient would like to receive her [place] shipment.     Goals Addressed               This Visit's Progress     Patient Stated     I want to lower my A1c to 6.0 (pt-stated)        Care Coordination Interventions: Evaluation of current treatment plan related to type 2 diabetes mellitus and patient's adherence to plan as established by provider Reviewed and discussed with patient she is currently taking samples of Jardiance as directed, she has yet to receive her shipment of Farxiga Discussed recent f/u with Teresa Golden [facility] who will check with the pharmacy team regarding patient's assistance through [facility] and [place] Discussed patient will ask the pharmacist for additional samples if needed due to shipment delay of Farxiga Lab Results  Component Value Date   HGBA1C 6.5 (H) 06/23/2022        COMPLETED: To get over UTI without complications (pt-stated)        Care Coordination Interventions: Evaluation of current treatment plan related to impaired urinary elimination  and patient's adherence to plan as established by provider Discussed with patient she is no longer experiencing symptoms suggestive of UTI Educated patient on the importance to staying well hydrated with water aiming for 48 - 64 oz daily, balance activity with rest and complete full course of antibiotics Instructed patient to keep Teresa Golden well informed of new or worsening symptoms         Other     COMPLETED: To overcome diarrhea without complications        Care Coordination Interventions: Evaluation of current treatment plan related to diarrhea and patient's adherence to plan as established by provider Determined patient  is no longer having diarrhea, she continues to have intermittent constipation but with increasing her water and fruit, the constipation improves Mailed printed patient educational materials related to Constipation  Instructed patient to keep her PCP well informed of new symptoms or concerns     Interventions Today    Flowsheet Row Most Recent Value  Chronic Disease   Chronic disease during today's visit Diabetes; Constipation  General Interventions   General Interventions Discussed/Reviewed General Interventions Discussed, General Interventions Reviewed  Education Interventions   Education Provided Provided Education  Provided Verbal Education On Nutrition, Medication  Nutrition Interventions   Nutrition Discussed/Reviewed Nutrition Discussed, Nutrition Reviewed, Fluid intake  Pharmacy Interventions   Pharmacy Dicussed/Reviewed Pharmacy Topics Discussed, Pharmacy Topics Reviewed          SDOH assessments and interventions completed:  No     Care Coordination Interventions:  Yes, provided   Follow up plan: Follow up call scheduled for 09/12/22 @2  PM    Encounter Outcome:  Pt. Visit Completed

## 2022-09-01 ENCOUNTER — Other Ambulatory Visit: Payer: Medicare HMO | Admitting: Pharmacist

## 2022-09-01 ENCOUNTER — Ambulatory Visit: Payer: Self-pay

## 2022-09-01 NOTE — Patient Outreach (Signed)
  Care Coordination   09/01/2022 Name: KHALIDA SAX MRN: [id number] DOB: Jul 08, 1945   Care Coordination Outreach Attempts:  An unsuccessful telephone outreach was attempted for a scheduled appointment today.  Follow Up Plan:  Additional outreach attempts will be made to offer the patient care coordination information and services.   Encounter Outcome:  No Answer   Care Coordination Interventions:  No, not indicated    Delsa Sale, RN, BSN, CCM Care Management Coordinator [facility] Care Management  Direct Phone: [phone]

## 2022-09-01 NOTE — Patient Instructions (Signed)
Visit Information  Thank you for taking time to visit with me today. Please don't hesitate to contact me if I can be of assistance to you.   Following are the goals we discussed today:   Goals Addressed               This Visit's Progress     Patient Stated     I want to lower my A1c to 6.0 (pt-stated)        Care Coordination Interventions: Received inbound call from patient regarding her Marcelline Deist, voice message received from patient stating she is down to her last Marcelline Deist, she has yet to receive her [place] shipment Collaborated with Catie Clearance Coots [facility], requested a status update for patient's shipment of Farxiga Received an update from pharmacist Catie advising patient's shipment of Marcelline Deist was shipped today Placed outbound call to patient to advise of her [place] shipment status, provided patient with the UPS tracking number and advised patient her shipment arrived in [place] today Determined patient will notify this RN if further assistance is needed  Lab Results  Component Value Date   HGBA1C 6.5 (H) 06/23/2022          Our next appointment is by telephone on 09/12/22 at 2:00 PM  Please call the care guide team at [phone] if you need to cancel or reschedule your appointment.   If you are experiencing a Mental Health or Behavioral Health Crisis or need someone to talk to, please call 1-800-273-TALK (toll free, 24 hour hotline)  Patient verbalizes understanding of instructions and care plan provided today and agrees to view in MyChart. Active MyChart status and patient understanding of how to access instructions and care plan via MyChart confirmed with patient.     Delsa Sale, RN, BSN, CCM Care Management Coordinator [facility] Care Management  Direct Phone: [phone]

## 2022-09-01 NOTE — Progress Notes (Signed)
Care Coordination Call  Patient contacted myself, Delsa Sale, and the office to notify that she has 1 dose of Jardiance left. She was approved for [place] assistance.   Contacted Music therapist. They provided tracking number  - [phone] via UPS. Out for delivery today.   Lawanna Kobus will notify patient.   Catie Eppie Gibson, PharmD, BCACP, CPP Clinical Pharmacist [facility] [phone]

## 2022-09-01 NOTE — Patient Outreach (Signed)
  Care Coordination   Follow Up Visit Note   09/01/2022 Name: LEYANI VALVO MRN: [id number] DOB: 03/28/1945  SILVERIA LAKO is a 77 y.o. year old female who sees Dorothyann Peng, MD for primary care. I spoke with  Margrett Rud by phone today.  What matters to the patients health and wellness today?  Patient would like to receive her [place] shipment before running out of medication.     Goals Addressed               This Visit's Progress     Patient Stated     I want to lower my A1c to 6.0 (pt-stated)        Care Coordination Interventions: Received inbound call from patient regarding her Marcelline Deist, voice message received from patient stating she is down to her last Marcelline Deist, she has yet to receive her [place] shipment Collaborated with Catie Clearance Coots [facility], requested a status update for patient's shipment of Farxiga Received an update from pharmacist Catie advising patient's shipment of Marcelline Deist was shipped today Placed outbound call to patient to advise of her [place] shipment status, provided patient with the UPS tracking number and advised patient her shipment arrived in [place] today Determined patient will notify this RN if further assistance is needed  Lab Results  Component Value Date   HGBA1C 6.5 (H) 06/23/2022      Interventions Today    Flowsheet Row Most Recent Value  Chronic Disease   Chronic disease during today's visit Diabetes  General Interventions   General Interventions Discussed/Reviewed General Interventions Discussed, General Interventions Reviewed, Doctor Visits, Communication with  Doctor Visits Discussed/Reviewed Doctor Visits Discussed, Doctor Visits Reviewed, Specialist  Communication with Pharmacists   Magdalene Molly RPH]  Education Interventions   Education Provided Provided Education  Provided Verbal Education On Medication  Pharmacy Interventions   Pharmacy Dicussed/Reviewed Pharmacy Topics Discussed, Pharmacy Topics Reviewed, Medications and their  functions          SDOH assessments and interventions completed:  No     Care Coordination Interventions:  Yes, provided   Follow up plan: Follow up call scheduled for 09/12/22 @2 :00 PM    Encounter Outcome:  Pt. Visit Completed

## 2022-09-02 NOTE — Telephone Encounter (Signed)
Per automated system, medication shipped 08/25/22

## 2022-09-09 ENCOUNTER — Ambulatory Visit: Payer: Medicare HMO | Admitting: Psychiatry

## 2022-09-09 DIAGNOSIS — F3340 Major depressive disorder, recurrent, in remission, unspecified: Secondary | ICD-10-CM

## 2022-09-09 DIAGNOSIS — F4323 Adjustment disorder with mixed anxiety and depressed mood: Secondary | ICD-10-CM | POA: Diagnosis not present

## 2022-09-09 DIAGNOSIS — R69 Illness, unspecified: Secondary | ICD-10-CM | POA: Diagnosis not present

## 2022-09-09 DIAGNOSIS — Z630 Problems in relationship with spouse or partner: Secondary | ICD-10-CM | POA: Diagnosis not present

## 2022-09-09 NOTE — Progress Notes (Signed)
Psychotherapy Progress Note [facility] [name], PhD LP  [name] [facility] "[name]")    MRN: [id number] Therapy format: Individual psychotherapy Date: 09/09/2022      Start: 10:15a     Stop: 11:05a     Time Spent: 50 min Location: In-person   Session narrative (presenting needs, interim history, self-report of stressors and symptoms, applications of prior therapy, status changes, and interventions made in session) Busy, and things generally going well.  Has made a clean break with [name] now, after another yelling fight.  Feeling more at home at her current church after searching around a bit, and has now dug in to Sunday school.  Befriending more several people she rides with via [employer].  Feeling good about reinvesting there.  Made her way fairly quickly through moping about picking the wrong man again.    Did end up signing the new lease, assured enough that her ability to live there and afford it is safe.  "Happy solution" being that a neighbor she's befriended may be moving out and has offered to rent to her at about th time next lease would be up.  Plus the reduced cost of moving would help afford some new furniture.  Did not take up the threat of foreclosure with landlady, but does know now that she has legal standing should her unit go to foreclosure, and it would not happen until at least January.  Clearly de-escalated.  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    At home, finds she let her place go to seed more while in the relationship with [name] and just after.  Sees that she was in a depression, both with and after him, but has straightened up now.  Cooking actively, with fruit in season.  Chandra Batch is back in regular touch now, but clear that she is not going to fall back in with him as she was, nor count on him  to validate her the way she used to.  Encouraged caution, affirmed and encouraged cleaning up and looking into work options.  Therapeutic modalities: Cognitive Behavioral Therapy, Solution-Oriented/Positive Psychology, and Ego-Supportive  Mental Status/Observations:  Appearance:   Casual     Behavior:  Appropriate  Motor:  Normal  Speech/Language:   Clear and Coherent  Affect:  Appropriate  Mood:  normal  Thought process:  normal  Thought content:    WNL  Sensory/Perceptual disturbances:    WNL  Orientation:  Fully oriented  Attention:  Good    Concentration:  Good  Memory:  WNL  Insight:    Good  Judgment:   Good  Impulse Control:  Good   Risk Assessment: Danger to Self: No Self-injurious Behavior: No Danger to Others: No Physical Aggression / Violence: No Duty to Warn: No Access to Firearms a concern: No  Assessment of progress:  progressing  Diagnosis:   ICD-10-CM   1. Adjustment disorder with mixed anxiety and depressed mood  F43.23     2. MDD (recurrent major depressive disorder) in remission (HCC)  F33.40     3. Relationship problem between partners  Z63.0    recently resolved with breakup    4. Multiple comorbid health conditions  R69      Plan:  Living situation -- Try to reach landlady for explanation and update on what to expect.  Research tenant's rights  in the event of landowner default, so she can know what to expect.  If tempted to withhold payment, resist.  Resources may include Legal Aid, [employer], others.  Of course, plan for the potential need to relocate on fairly short notice. Socialization -- Continue involvement in church, other settings as able.  Look for good people she can be friends with, including if it means shopping other churches. Relationship -- Endorse choice over relationships, so long as she can keep her integrity.  Caution about renewing interest in [place], given hx of proven infidelity.  Notice and dispute  conclusion-jumping, any desperate impulses, and practice the right to challenge any invalidating treatment. Self-esteem -- Per her faith, continue to practice self-esteem grounded in God's unconditional love.  Self-affirm that whatever she might deem "stupid", she has either learned, improved, or was doing the best she could figure out at the time.  When tempted to feel like a failure for lack of family, reframe self-image as a survivor who had limited opportunity to make family for a while, then had to take on the harshest of losses. General health -- Endorse any healthy activity, to include pool/gym if able and interested. Family -- Endorse boundary/limits with [name] Work -- Endorse option to work part-time if truly able and interested Other recommendations/advice -- As may be noted above.  Continue to utilize previously learned skills ad lib. Medication compliance -- Maintain medication as prescribed and work faithfully with relevant prescriber(s) if any changes are desired or seem indicated. Crisis service -- Aware of call list and work-in appts.  Call the clinic on-call service, 988/hotline, 911, or present to [facility] or ER if any life-threatening psychiatric crisis. Followup -- Return for time as already scheduled.  Next scheduled visit with me 09/23/2022.  Next scheduled in this office 09/23/2022.  Robley Fries, PhD [name], PhD LP Clinical Psychologist, [facility] [address] [place], [place] [address] [phone]

## 2022-09-10 ENCOUNTER — Other Ambulatory Visit: Payer: Medicare HMO

## 2022-09-12 ENCOUNTER — Ambulatory Visit: Payer: Self-pay

## 2022-09-12 NOTE — Patient Outreach (Signed)
  Care Coordination   Follow Up Visit Note   09/12/2022 Name: ANGALINA ANTE MRN: [id number] DOB: 05/16/1945  LYFE REIHL is a 77 y.o. year old female who sees Dorothyann Peng, MD for primary care. I spoke with  Margrett Rud by phone today.  What matters to the patients health and wellness today?  Patient would like to continue to take Marcelline Deist as directed to help manage her diabetes.     Goals Addressed               This Visit's Progress     Patient Stated     I want to lower my A1c to 6.0 (pt-stated)        Care Coordination Interventions: Provided education to patient about basic DM disease process Reviewed medications with patient and discussed importance of medication adherence, patient received her Farxiga shipment, she is adhering to her prescribed treatment regimen  Provided patient general recommendations for tips on how to help keep her Libre sensor in place, advised patient of skin adhesive prep pads and protection sleeve to protect her sensor while during water aerobics Lab Results  Component Value Date   HGBA1C 6.5 (H) 06/23/2022     Interventions Today    Flowsheet Row Most Recent Value  Chronic Disease   Chronic disease during today's visit Diabetes  General Interventions   General Interventions Discussed/Reviewed General Interventions Discussed, General Interventions Reviewed, Doctor Visits, Durable Medical Equipment (DME)  Doctor Visits Discussed/Reviewed Doctor Visits Discussed, Doctor Visits Reviewed, PCP  Durable Medical Equipment (DME) Glucomoter  Exercise Interventions   Exercise Discussed/Reviewed Exercise Discussed, Exercise Reviewed, Physical Activity  Physical Activity Discussed/Reviewed Physical Activity Reviewed, Physical Activity Discussed  Education Interventions   Education Provided Provided Education  Provided Verbal Education On Blood Sugar Monitoring, Nutrition, Medication, Exercise  Nutrition Interventions   Nutrition Discussed/Reviewed  Nutrition Discussed, Nutrition Reviewed, Fluid intake  Pharmacy Interventions   Pharmacy Dicussed/Reviewed Pharmacy Topics Discussed, Pharmacy Topics Reviewed, Medications and their functions          SDOH assessments and interventions completed:  No     Care Coordination Interventions:  Yes, provided   Follow up plan: Follow up call scheduled for 10/09/22 @2 :00 PM    Encounter Outcome:  Pt. Visit Completed

## 2022-09-12 NOTE — Patient Instructions (Signed)
Visit Information  Thank you for taking time to visit with me today. Please don't hesitate to contact me if I can be of assistance to you.   Following are the goals we discussed today:   Goals Addressed               This Visit's Progress     Patient Stated     I want to lower my A1c to 6.0 (pt-stated)        Care Coordination Interventions: Provided education to patient about basic DM disease process Reviewed medications with patient and discussed importance of medication adherence, patient received her [place] shipment, she is adhering to her prescribed treatment regimen  Provided patient general recommendations for tips on how to help keep her Libre sensor in place, advised patient of skin adhesive prep pads and protection sleeve to protect her sensor while during water aerobics Lab Results  Component Value Date   HGBA1C 6.5 (H) 06/23/2022         Our next appointment is by telephone on 10/09/22 at 2:00 PM  Please call the care guide team at [phone] if you need to cancel or reschedule your appointment.   If you are experiencing a Mental Health or Behavioral Health Crisis or need someone to talk to, please call 1-800-273-TALK (toll free, 24 hour hotline)  Patient verbalizes understanding of instructions and care plan provided today and agrees to view in MyChart. Active MyChart status and patient understanding of how to access instructions and care plan via MyChart confirmed with patient.     Delsa Sale, RN, BSN, CCM Care Management Coordinator [facility] Care Management  Direct Phone: [phone]

## 2022-09-16 ENCOUNTER — Ambulatory Visit: Payer: Managed Care, Other (non HMO)

## 2022-09-16 NOTE — Patient Outreach (Signed)
  Care Coordination   Follow Up Visit Note   09/16/2022 Name: Teresa Golden MRN: [id number] DOB: 1946/01/02  Teresa Golden is a 77 y.o. year old female who sees Dorothyann Peng, MD for primary care. I sent a my chart message to patient regarding information she requested for Teresa Golden 3 supplies.   What matters to the patients health and wellness today?  Patient would like to purchase a protective sleeve and adhesive wipe for her Libre 3 sensor.     Goals Addressed               This Visit's Progress     Patient Stated     I want to lower my A1c to 6.0 (pt-stated)        Care Coordination Interventions: Received inbound call from patient requesting the information previously discussed regarding adhesive padding and protective covers for her Teresa Golden 3 Placed unsuccessful call to patient, left a HIPAA compliant voice message  Sent my chart message to patient listing the skin adhesive prep pads and protection sleeve to protect her sensor while during water aerobics Lab Results  Component Value Date   HGBA1C 6.5 (H) 06/23/2022         SDOH assessments and interventions completed:  No     Care Coordination Interventions:  Yes, provided   Follow up plan: Follow up call scheduled for 10/09/22 @2 :00 PM     Encounter Outcome:  Pt. Visit Completed

## 2022-09-16 NOTE — Patient Instructions (Signed)
Visit Information  Thank you for taking time to visit with me today. Please don't hesitate to contact me if I can be of assistance to you.   Following are the goals we discussed today:   Goals Addressed               This Visit's Progress     Patient Stated     I want to lower my A1c to 6.0 (pt-stated)        Care Coordination Interventions: Received inbound call from patient requesting the information previously discussed regarding adhesive padding and protective covers for her Teresa Golden 3 Placed unsuccessful call to patient, left a HIPAA compliant voice message  Sent my chart message to patient listing the skin adhesive prep pads and protection sleeve to protect her sensor while during water aerobics Lab Results  Component Value Date   HGBA1C 6.5 (H) 06/23/2022              Our next appointment is by telephone on 10/09/22 at 2:00 PM  Please call the care guide team at [phone] if you need to cancel or reschedule your appointment.   If you are experiencing a Mental Health or Behavioral Health Crisis or need someone to talk to, please call 1-800-273-TALK (toll free, 24 hour hotline)  Patient verbalizes understanding of instructions and care plan provided today and agrees to view in MyChart. Active MyChart status and patient understanding of how to access instructions and care plan via MyChart confirmed with patient.     Delsa Sale, RN, BSN, CCM Care Management Coordinator [facility] Care Management Direct Phone: [phone]

## 2022-09-18 ENCOUNTER — Other Ambulatory Visit: Payer: Managed Care, Other (non HMO)

## 2022-09-23 ENCOUNTER — Ambulatory Visit: Payer: Medicare HMO | Admitting: Psychiatry

## 2022-09-23 DIAGNOSIS — F4323 Adjustment disorder with mixed anxiety and depressed mood: Secondary | ICD-10-CM | POA: Diagnosis not present

## 2022-09-23 DIAGNOSIS — Z630 Problems in relationship with spouse or partner: Secondary | ICD-10-CM

## 2022-09-23 DIAGNOSIS — F3340 Major depressive disorder, recurrent, in remission, unspecified: Secondary | ICD-10-CM | POA: Diagnosis not present

## 2022-09-23 DIAGNOSIS — R69 Illness, unspecified: Secondary | ICD-10-CM

## 2022-09-23 NOTE — Progress Notes (Signed)
Psychotherapy Progress Note [facility] [name], PhD LP  [name] [facility] "Teresa Golden")    MRN: [id number] Therapy format: Individual psychotherapy Date: 09/23/2022      Start: 9:14a     Stop: 10:01a     Time Spent: 47 min Location: In-person   Session narrative (presenting needs, interim history, self-report of stressors and symptoms, applications of prior therapy, status changes, and interventions made in session) Enthused to have found a more inspiring church, [employer], digging into adult Bible study now and more dedicated to pursuing a spiritually nourishing life.  Drawn to the friendly atmosphere, and the combination preaching/teaching style of the bishop.  Enjoying the camaraderie of about 6 other regular [employer] riders getting there, and spends about 7a - 2p involved, much more active than before.  Looking to go on a women's retreat to [facility] early Nov., hopeful of sinking roots more into the community, now 11 years here and recovering socially from pandemic interruptions.  Still gets frequent calls from [name], which she takes, and shares that he mans to travel down from [place] once he has a Psychologist, occupational.  Meanwhile, met a new man on Facebook, Ray, also on fixed income.  Still no longings for [place], [place] with the end of it.  So far, good signs in Ray being revealing about his prostate problem, being in therapy, age 14, on fixed income, cohabitating with 2 sisters and a Scientist, forensic, mother in nursing home, in D&A recovery, and diet-minded.    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  States goal at this point to be more healthy and content with self.  Est 7/10 content with self at this point, esp for having gotten reinvolved in church and worked through some backed up [employer].  Trying to adopt a policy of going ahead  and doing some vs. Procrastinating home tasks.  Got back yard worked on last week.   Considering a little entertaining, first in some years.  Financially, feels back on track, including contributing at church.  Affirmed and encouraged.  Therapeutic modalities: Cognitive Behavioral Therapy, Solution-Oriented/Positive Psychology, and Ego-Supportive  Mental Status/Observations:  Appearance:   Casual     Behavior:  Appropriate  Motor:  Normal exc Rollator  Speech/Language:   Clear and Coherent  Affect:  Appropriate  Mood:  normal  Thought process:  normal  Thought content:    WNL  Sensory/Perceptual disturbances:    WNL  Orientation:  Fully oriented  Attention:  Good    Concentration:  Good  Memory:  WNL  Insight:    Good  Judgment:   Good  Impulse Control:  Good   Risk Assessment: Danger to Self: No Self-injurious Behavior: No Danger to Others: No Physical Aggression / Violence: No Duty to Warn: No Access to Firearms a concern: No  Assessment of progress:  progressing  Diagnosis:   ICD-10-CM   1. Adjustment disorder with mixed anxiety and depressed mood  F43.23     2. MDD (recurrent major depressive disorder) in remission (HCC)  F33.40     3. Relationship problem between partners  Z63.0     4. Multiple comorbid health conditions  R69      Plan:  Socialization -- Continue involvement in church, other settings as able.  Look for good people she can be friends with, including if it means  shopping other churches. Relationship -- Endorse choice over relationships, so long as she can keep her integrity.  Caution about renewing interest in [place], given hx of proven infidelity.  Notice and dispute conclusion-jumping, any desperate impulses, and practice the right to challenge any invalidating treatment. Self-esteem -- Per her faith, continue to practice self-esteem grounded in God's unconditional love.  Self-affirm that whatever she might deem "stupid", she has either learned,  improved, or was doing the best she could figure out at the time.  When tempted to feel like a failure for lack of family, reframe self-image as a survivor who had limited opportunity to make family for a while, then had to take on the harshest of losses.  Serve self reliably in housekeeping. General health -- Endorse any healthy activity, to include pool/gym if able and interested. Family -- Endorse boundary/limits with [name] Work -- Endorse option to work part-time if truly able and interested Living situation -- As needed, confer with landlady and research tenant's rights in the event of landowner mortgage default.  Resources may include Legal Aid, [employer], others.   Other recommendations/advice -- As may be noted above.  Continue to utilize previously learned skills ad lib. Medication compliance -- Maintain medication as prescribed and work faithfully with relevant prescriber(s) if any changes are desired or seem indicated. Crisis service -- Aware of call list and work-in appts.  Call the clinic on-call service, 988/hotline, 911, or present to [facility] or ER if any life-threatening psychiatric crisis. Followup -- Return for time as already scheduled.  Next scheduled visit with me 10/07/2022.  Next scheduled in this office 10/07/2022.  Robley Fries, PhD [name], PhD LP Clinical Psychologist, [facility] [address] [place], [place] [address] [phone]

## 2022-09-24 ENCOUNTER — Ambulatory Visit: Admission: RE | Admit: 2022-09-24 | Payer: Medicare HMO | Source: Ambulatory Visit

## 2022-09-24 DIAGNOSIS — C220 Liver cell carcinoma: Secondary | ICD-10-CM

## 2022-09-29 ENCOUNTER — Telehealth: Payer: Self-pay | Admitting: Psychiatry

## 2022-09-29 NOTE — Telephone Encounter (Signed)
Admin note for non-service contact  [name]  MRN: [id number] DATE: 09/29/2022  Calling for advice re. feeling upset suddenly not hearing from the man she's been interacting with.  Based on cousin Andrea's criticism, started wondering if she is "crazy" or "mentally ill".  Turns out the man surprise-visited her while she was out on an errand (Tree surgeon appt) and continued to speak with her after seeing her in person, so able to put down the idea that he was turned off meeting her, but hard to shake self-doubts about her mind/personality in the unexpected silence.  Reviewed scant evidence for the idea that she alienated him and reminded her that a broken or dead phone or a personal or family emergency could also account for only reaching voice mail the last 3 days.  Encouraged to self-remind she feels "crazy" because high hopes + high uncertainty = high anxiety, but it's normal, just worth reminding herself there are non-rejection reasons for what she's dealing with.  RTO next week as scheduled.  Robley Fries, PhD [name], PhD LP Clinical Psychologist, [facility] [address] [place], [place] [address] [phone]

## 2022-09-30 ENCOUNTER — Ambulatory Visit: Payer: Medicare HMO | Admitting: Podiatry

## 2022-10-07 ENCOUNTER — Ambulatory Visit: Payer: Medicare HMO | Admitting: Internal Medicine

## 2022-10-07 ENCOUNTER — Encounter: Payer: Self-pay | Admitting: Internal Medicine

## 2022-10-07 ENCOUNTER — Ambulatory Visit (INDEPENDENT_AMBULATORY_CARE_PROVIDER_SITE_OTHER): Payer: Medicare HMO | Admitting: Psychiatry

## 2022-10-07 ENCOUNTER — Ambulatory Visit (INDEPENDENT_AMBULATORY_CARE_PROVIDER_SITE_OTHER): Payer: Medicare HMO | Admitting: Internal Medicine

## 2022-10-07 VITALS — BP 122/74 | HR 88 | Temp 98.1°F | Ht 65.0 in | Wt 199.4 lb

## 2022-10-07 DIAGNOSIS — E6609 Other obesity due to excess calories: Secondary | ICD-10-CM

## 2022-10-07 DIAGNOSIS — Z630 Problems in relationship with spouse or partner: Secondary | ICD-10-CM

## 2022-10-07 DIAGNOSIS — E1122 Type 2 diabetes mellitus with diabetic chronic kidney disease: Secondary | ICD-10-CM

## 2022-10-07 DIAGNOSIS — C220 Liver cell carcinoma: Secondary | ICD-10-CM

## 2022-10-07 DIAGNOSIS — Z794 Long term (current) use of insulin: Secondary | ICD-10-CM

## 2022-10-07 DIAGNOSIS — Z6833 Body mass index (BMI) 33.0-33.9, adult: Secondary | ICD-10-CM

## 2022-10-07 DIAGNOSIS — I129 Hypertensive chronic kidney disease with stage 1 through stage 4 chronic kidney disease, or unspecified chronic kidney disease: Secondary | ICD-10-CM | POA: Diagnosis not present

## 2022-10-07 DIAGNOSIS — F4323 Adjustment disorder with mixed anxiety and depressed mood: Secondary | ICD-10-CM

## 2022-10-07 DIAGNOSIS — L659 Nonscarring hair loss, unspecified: Secondary | ICD-10-CM

## 2022-10-07 DIAGNOSIS — N1831 Chronic kidney disease, stage 3a: Secondary | ICD-10-CM

## 2022-10-07 DIAGNOSIS — F331 Major depressive disorder, recurrent, moderate: Secondary | ICD-10-CM | POA: Diagnosis not present

## 2022-10-07 NOTE — Progress Notes (Signed)
Psychotherapy Progress Note [facility] Teresa Czar, PhD LP  Patient ID: BAXLEY TULLO [facility] "Teresa Golden")    MRN: [id number] Therapy format: Individual psychotherapy Date: 10/07/2022      Start: 9:08a     Stop: 10:18a     Time Spent: 70 min Location: In-person   Session narrative (presenting needs, interim history, self-report of stressors and symptoms, applications of prior therapy, status changes, and interventions made in session) Regret today, telling of a 77yo man she met on FB, intelligent and appealing, exchanged phone numbers, lives in [place].  In her loneliness, let him come pick her up instead of meeting at restaurant, knew she was already sexually interested and packed for it, and in fact stayed through to the next afternoon.  Learned he is in longterm alcohol recovery, skeptical of [place] [employer] (hypocritical, money-focused) With talk of traveling together, too, thought he would be more reachable th last 2 days, but no replies, and feeling foolish.  Reluctant to give herself a break for this, feels like she went cheap.  Clarified values, explored God's perspective as she understands it, and resistance to forgiveness, reframed as regret over shame, and wishing she had better protected herself from feeling too vulnerable, and more solid in her self-worth that she would have resisted letting herself get hung up on quick tokens of acceptance.  Ultimately decided to get herself back to church, pray for forgiveness.  Encouraged to listen for God, even if the answer seems too good.  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  Therapeutic modalities: Cognitive Behavioral Therapy, Solution-Oriented/Positive Psychology, Environmental manager, and Faith-sensitive  Mental Status/Observations:  Appearance:   Casual     Behavior:  Appropriate  Motor:  Normal and exc Rollator  Speech/Language:   Clear and Coherent   Affect:  Appropriate  Mood:  dysthymic  Thought process:  normal  Thought content:    WNL and guilt  Sensory/Perceptual disturbances:    WNL  Orientation:  Fully oriented  Attention:  Good    Concentration:  Good  Memory:  WNL  Insight:    Good  Judgment:   Good  Impulse Control:  Good   Risk Assessment: Danger to Self: No Self-injurious Behavior: No Danger to Others: No Physical Aggression / Violence: No Duty to Warn: No Access to Firearms a concern: No  Assessment of progress:  progressing  Diagnosis:   ICD-10-CM   1. Adjustment disorder with mixed anxiety and depressed mood  F43.23     2. Major depressive disorder, recurrent episode, moderate (HCC)  F33.1     3. Relationship problem between partners  Z63.0      Plan:  Socialization -- Continue involvement in church, other settings as able.  Look for good people she can be friends with, including if it means shopping other churches. Relationship -- Endorse choice over relationships, so long as she can keep her integrity.  Caution about renewing interest in [place], given hx of proven infidelity.  Notice and dispute conclusion-jumping, any desperate impulses, and practice the right to challenge any invalidating treatment.  Similarly, respect the fact that she's lonely and resist desperate thinking about getting involved. Self-esteem -- Per her faith, continue to practice self-esteem grounded in God's unconditional love.  Self-affirm that whatever she might deem "stupid", she has either learned, improved, or was doing the best she could figure out at the time.  When tempted to feel like a failure for lack of family,  or for acting quickly on desires, reframe self-image as a survivor who had to take on the harshest of losses and is legitimately tempted.  Serve self reliably in housekeeping. General health -- Endorse any healthy activity, to include pool/gym if able and interested. Family -- Endorse boundary/limits with [name]  -- Endorse option to work part-time if truly able and interested Living situation -- As needed, confer with landlady and research tenant's rights in the event of landowner mortgage default.  Resources may include Legal Aid, [employer], others.   Other recommendations/advice -- As may be noted above.  Continue to utilize previously learned skills ad lib. Medication compliance -- Maintain medication as prescribed and work faithfully with relevant prescriber(s) if any changes are desired or seem indicated. Crisis service -- Aware of call list and work-in appts.  Call the clinic on-call service, 988/hotline, 911, or present to [facility] or ER if any life-threatening psychiatric crisis. Followup -- No follow-ups on file.  Next scheduled visit with me 10/21/2022.  Next scheduled in this office 10/21/2022.  Robley Fries, PhD Teresa Czar, PhD LP Clinical Psychologist, [facility] [address] [place], [place] [address] [phone]

## 2022-10-07 NOTE — Patient Instructions (Signed)

## 2022-10-07 NOTE — Progress Notes (Signed)
I,Teresa Golden, CMA,acting as a Neurosurgeon for Teresa Aliment, MD.,have documented all relevant documentation on the behalf of Teresa Aliment, MD,as directed by  Teresa Aliment, MD while in the presence of Teresa Aliment, MD.  Subjective:  [name] , female    DOB: 02-27-1945 , 77 y.o.   MRN: [id number]  Chief Complaint  Patient presents with   Diabetes   Hypertension    HPI  Patient presents today for a diabetes and BP check. She reports compliance with meds. She denies having any headaches, chest pain and shortness of breath.   She also would like to go over recent ultrasound.   Diabetes She presents for her follow-up diabetic visit. She has type 2 diabetes mellitus. Pertinent negatives for diabetes include no blurred vision. There are no hypoglycemic complications. Risk factors for coronary artery disease include diabetes mellitus, dyslipidemia, hypertension, obesity, sedentary lifestyle and post-menopausal. She is following a diabetic diet. She participates in exercise intermittently. Her home blood glucose trend is fluctuating minimally. Her breakfast blood glucose is taken between 8-9 am. Her breakfast blood glucose range is generally 110-130 mg/dl. An ACE inhibitor/angiotensin II receptor blocker is being taken. Eye exam is current.  Hypertension This is a chronic problem. The current episode started more than 1 year ago. The problem has been gradually improving since onset. The problem is uncontrolled. Pertinent negatives include no blurred vision. Risk factors for coronary artery disease include diabetes mellitus, dyslipidemia, post-menopausal state and sedentary lifestyle. Past treatments include angiotensin blockers and beta blockers. The current treatment provides moderate improvement.     Past Medical History:  Diagnosis Date   Arthritis    Chronic kidney disease    self reports ckd stage 3    Depression    Diabetes mellitus, type II, insulin dependent  (HCC)    With neurologic complications. 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    Hypertensive nephropathy 07/05/2018   Hypothyroidism    Hypothyroidism 02/06/2018   Morbid obesity with BMI of 50.0-59.9, adult (HCC)    Scoliosis      Family History  Problem Relation Age of Onset   Heart attack Mother    Heart failure Mother    Stroke Father      Current Outpatient Medications:    allopurinol (ZYLOPRIM) 100 MG tablet, Take 1 tablet (100 mg total) by mouth daily., Disp: 90 tablet, Rfl: 2   amLODipine (NORVASC) 5 MG tablet, Take 1 tablet by mouth once daily, Disp: 90 tablet, Rfl: 0   AZO-CRANBERRY PO, Take 1 tablet by mouth daily., Disp: , Rfl:    B Complex-C (B-COMPLEX WITH VITAMIN C) tablet, Take 1 tablet by mouth 3 (three) times a week., Disp: , Rfl:    Biotin 11914 MCG TABS, Take 10,000 mcg by mouth daily., Disp: , Rfl:    cetirizine (ZYRTEC) 10 MG tablet, Take 10 mg by mouth at bedtime., Disp: , Rfl:    Cholecalciferol 5000 units TABS, Take 5,000 Units by mouth daily., Disp: , Rfl:    colchicine 0.6 MG tablet, Take 1 tablet by mouth for gout attack., Disp: 30 tablet, Rfl: 1   dapagliflozin propanediol (FARXIGA) 10 MG TABS tablet, Take 1 tablet (  10 mg total) by mouth daily., Disp: 90 tablet, Rfl: 3   diclofenac Sodium (VOLTAREN) 1 % GEL, APPLY 2 GRAMS TO AFFECTED AREA(S) 4 TIMES DAILY AS NEEDED (Patient taking differently: Apply 2-4 g topically every 6 (six) hours as needed (to affected areas).), Disp: 200 g, Rfl: 2   gabapentin (NEURONTIN) 300 MG capsule, Take 600 mg by mouth at bedtime., Disp: , Rfl:    glucose blood (ONETOUCH VERIO) test strip, USE TO CHECK BLOOD SUGAR   TWO TIMES A DAY AS         INSTRUCTED, Disp: 200 strip, Rfl: 3    Insulin Degludec (TRESIBA) 100 UNIT/ML SOLN, Inject 10 Units/oz/day into the skin at bedtime. Patient is taking at bedtime, Disp: , Rfl:    Lancets (ONETOUCH DELICA PLUS LANCET33G) MISC, USE TO CHECK BLOOD SUGARS  TWO TIMES A DAY AS         INSTRUCTED, Disp: 200 each, Rfl: 3   levothyroxine (SYNTHROID) 25 MCG tablet, Take 1/2 tablet daily., Disp: 90 tablet, Rfl: 1   losartan (COZAAR) 100 MG tablet, Take 1 tablet (100 mg total) by mouth daily., Disp: 90 tablet, Rfl: 2   metoprolol succinate (TOPROL-XL) 25 MG 24 hr tablet, TAKE 1 TABLET AT BEDTIME   (DISCONTINUE COREG), Disp: 90 tablet, Rfl: 1   Multiple Vitamins-Minerals (MULTIVITAMIN WITH MINERALS) tablet, Take 1 tablet by mouth daily. Women's 50+, Disp: , Rfl:    pantoprazole (PROTONIX) 40 MG tablet, Take 40 mg by mouth daily as needed., Disp: , Rfl:    Semaglutide, 1 MG/DOSE, (OZEMPIC, 1 MG/DOSE,) 4 MG/3ML SOPN, Inject 1 mg into the skin once a week., Disp: 9 mL, Rfl: 1   simvastatin (ZOCOR) 10 MG tablet, Take 1 tablet (10 mg total) by mouth daily., Disp: 90 tablet, Rfl: 0   Allergies  Allergen Reactions   Meloxicam Other (See Comments)    Fever; muscle aches; "flu-like" symptoms "Allergic," per MAR   Other Other (See Comments)    "Rose fever and hay fever"     Review of Systems  Constitutional: Negative.   Eyes:  Negative for blurred vision.  Respiratory: Negative.    Cardiovascular: Negative.   Gastrointestinal: Negative.   Skin: Negative.   Neurological: Negative.   Psychiatric/Behavioral: Negative.       Today's Vitals   10/07/22 1407 10/07/22 1450  BP: (!) 140/90 122/74  Pulse: 88   Temp: 98.1 F (36.7 C)   SpO2: 98%   Weight: 199 lb 6.4 oz (90.4 kg)   Height: 5\' 5"  (1.651 m)    Body mass index is 33.18 kg/m.  Wt Readings from Last 3 Encounters:  10/07/22 199 lb 6.4 oz (90.4 kg)  07/28/22 202 lb 9.6 oz (91.9 kg)  07/16/22 202 lb (91.6 kg)     Objective:  Physical Exam Vitals and nursing note reviewed.   Constitutional:      Appearance: Normal appearance.  HENT:     Head: Normocephalic and atraumatic.  Eyes:     Extraocular Movements: Extraocular movements intact.  Cardiovascular:     Rate and Rhythm: Normal rate and regular rhythm.     Heart sounds: Murmur heard.  Pulmonary:     Effort: Pulmonary effort is normal.     Breath sounds: Normal breath sounds.  Musculoskeletal:     Cervical back: Normal range of motion.     Comments: AMBULATORY WITH WALKER  Skin:    General: Skin is warm.  Neurological:     General: No focal deficit  present.     Mental Status: She is alert.  Psychiatric:        Mood and Affect: Mood normal.        Behavior: Behavior normal.         Assessment And Plan:  Type 2 diabetes mellitus with stage 3a chronic kidney disease, with long-term current use of insulin (HCC) Assessment & Plan: Chronic, she is currently taking Farxiga 10mg  and semaglutide 1mg  weekly. Importance of dietary/medication compliance was discussed with the patient in detail. I will adjust meds as needed after reviewing labs listed below.  She will f/u in three to four months.  Orders: -     Hemoglobin A1c -     BMP8+eGFR  Hypertensive nephropathy Assessment & Plan: Chronic, controlled.  She will continue with metoprolol succinate XL 25mg  daily at bedtime and losartan 100mg  daily. She is encouraged to follow low sodium diet.   Orders: -     BMP8+eGFR  Hepatoma [facility]) Assessment & Plan: Chronic, previously followed by [facility] Hepatology clinic; however, she has not seen them since 2022.  Most recent RUQ u/s results reviewed in detail. Results significant for 4.2 cm echogenic mass in the right hepatic lobe. This is mildly increased in size however likely represents hemangioma. She will have repeat u/s in six months. Most recent Hepatology notes reviewed in Care Everywhere.    Hair loss Assessment & Plan: She has h/o hypothyroidism. She was euthyroid in March 2024. I will check  CBC and iron level. I will recommend iron supplementation if indicated. I will also refer her to Dermatology for further evaluation. She is in agreement with her treatment plan.   Orders: -     CBC -     Iron, TIBC and Ferritin Panel -     Ambulatory referral to Dermatology  Class 1 obesity due to excess calories with serious comorbidity and body mass index (BMI) of 33.0 to 33.9 in adult Assessment & Plan: She is encouraged to strive for BMI less than 30 to decrease cardiac risk. Advised to aim for at least 150 minutes of exercise per week.    She is encouraged to strive for BMI less than 30 to decrease cardiac risk. Advised to aim for at least 150 minutes of exercise per week.    Return in about 3 months (around 01/07/2023), or dm check.  Patient was given opportunity to ask questions. Patient verbalized understanding of the plan and was able to repeat key elements of the plan. All questions were answered to their satisfaction.   I, Teresa Aliment, MD, have reviewed all documentation for this visit. The documentation on 10/07/22 for the exam, diagnosis, procedures, and orders are all accurate and complete.   IF YOU HAVE BEEN REFERRED TO A SPECIALIST, IT MAY TAKE 1-2 WEEKS TO SCHEDULE/PROCESS THE REFERRAL. IF YOU HAVE NOT HEARD FROM US/SPECIALIST IN TWO WEEKS, PLEASE GIVE [place] A CALL AT [phone].   THE PATIENT IS ENCOURAGED TO PRACTICE SOCIAL DISTANCING DUE TO THE COVID-19 PANDEMIC.

## 2022-10-09 ENCOUNTER — Ambulatory Visit: Payer: Self-pay

## 2022-10-09 NOTE — Patient Instructions (Signed)
Visit Information  Thank you for taking time to visit with me today. Please don't hesitate to contact me if I can be of assistance to you.   Following are the goals we discussed today:   Goals Addressed               This Visit's Progress     Patient Stated     I want to lower my A1c to 6.0 (pt-stated)   On track     Care Coordination Interventions: Evaluation of current treatment plan related to type 2 diabetes and patient's adherence to plan as established by provider Provided patient with written educational materials related to hypo and hyperglycemia and importance of correct treatment Review of patient status, including review of consultants reports, relevant laboratory and other test results, and medications completed Counseled on Diabetic diet, including the importance of limiting sweets and sugary beverages Lab Results  Component Value Date   HGBA1C 6.6 (H) 10/07/2022        Other     To consider starting an antidepressant        Care Coordination Interventions: Evaluation of current treatment plan related to depression and patient's adherence to plan as established by provider Determined patient continues to receive outpatient therapy through behavioral therapy, unfortunately, she admits today to having worsening depression related to relationship problems Determined patient's next scheduled visit is scheduled for Monday, 10/16/22, she declines wanting to speak with Jenel Lucks LCSW at this time  Counseled patient on ways to help distract her from feeling down, such as signing up for a peer support group, encouraged patient to discuss this with her therapist during her next scheduled visit  Discussed with patient she may consider restarting an antidepressant, she plans to discuss this with her therapist during their next scheduled visit  Instructed patient to notify her PCP of any/all medications prescribed by her therapist          To improve kidney function         Care Coordination Interventions: Assessed the Patient understanding of chronic kidney disease    Evaluation of current treatment plan related to chronic kidney disease self management and patient's adherence to plan as established by provider      Reviewed prescribed diet increase water to 48-64 oz daily unless otherwise directed Discussed complications of poorly controlled blood pressure such as heart disease, stroke, circulatory complications, vision complications, kidney impairment, sexual dysfunction    Provided education on kidney disease progression    Engage patient in early, proactive and ongoing discussion about goals of care and what matters most to them    Last practice recorded BP readings:  BP Readings from Last 3 Encounters:  10/07/22 122/74  07/28/22 122/84  07/16/22 112/67   Most recent eGFR/CrCl:  Lab Results  Component Value Date   EGFR 39 (L) 10/07/2022    No components found for: "CRCL"                 Our next appointment is by telephone on 11/06/22 at 09:30 AM  Please call the care guide team at [phone] if you need to cancel or reschedule your appointment.   If you are experiencing a Mental Health or Behavioral Health Crisis or need someone to talk to, please call 1-800-273-TALK (toll free, 24 hour hotline)  Patient verbalizes understanding of instructions and care plan provided today and agrees to view in MyChart. Active MyChart status and patient understanding of how to access instructions and care plan  via MyChart confirmed with patient.     Delsa Sale, RN, BSN, CCM Care Management Coordinator [facility] Care Management  Direct Phone: [phone]

## 2022-10-09 NOTE — Patient Outreach (Signed)
Care Coordination   Follow Up Visit Note   10/09/2022 Name: Teresa Golden MRN: [id number] DOB: 1945/10/23  Teresa Golden is a 77 y.o. year old female who sees Dorothyann Peng, MD for primary care. I spoke with  Teresa Golden by phone today.  What matters to the patients health and wellness today?  Patient will cut back on diet sodas and Aleve to help improve her diabetes and kidney function.     Goals Addressed               This Visit's Progress     Patient Stated     I want to lower my A1c to 6.0 (pt-stated)   On track     Care Coordination Interventions: Evaluation of current treatment plan related to type 2 diabetes and patient's adherence to plan as established by provider Provided patient with written educational materials related to hypo and hyperglycemia and importance of correct treatment Review of patient status, including review of consultants reports, relevant laboratory and other test results, and medications completed Counseled on Diabetic diet, including the importance of limiting sweets and sugary beverages Lab Results  Component Value Date   HGBA1C 6.6 (H) 10/07/2022       Other     To consider starting an antidepressant        Care Coordination Interventions: Evaluation of current treatment plan related to depression and patient's adherence to plan as established by provider Determined patient continues to receive outpatient therapy through behavioral therapy, unfortunately, she admits today to having worsening depression related to relationship problems Determined patient's next scheduled visit is scheduled for Monday, 10/16/22, she declines wanting to speak with Jenel Lucks LCSW at this time  Counseled patient on ways to help distract her from feeling down, such as signing up for a peer support group, encouraged patient to discuss this with her therapist during her next scheduled visit  Discussed with patient she may consider restarting an antidepressant,  she plans to discuss this with her therapist during their next scheduled visit  Instructed patient to notify her PCP of any/all medications prescribed by her therapist          To improve kidney function        Care Coordination Interventions: Assessed the Patient understanding of chronic kidney disease    Evaluation of current treatment plan related to chronic kidney disease self management and patient's adherence to plan as established by provider      Reviewed prescribed diet increase water to 48-64 oz daily unless otherwise directed Discussed complications of poorly controlled blood pressure such as heart disease, stroke, circulatory complications, vision complications, kidney impairment, sexual dysfunction    Provided education on kidney disease progression    Engage patient in early, proactive and ongoing discussion about goals of care and what matters most to them    Last practice recorded BP readings:  BP Readings from Last 3 Encounters:  10/07/22 122/74  07/28/22 122/84  07/16/22 112/67   Most recent eGFR/CrCl:  Lab Results  Component Value Date   EGFR 39 (L) 10/07/2022    No components found for: "CRCL"    Interventions Today    Flowsheet Row Most Recent Value  Chronic Disease   Chronic disease during today's visit Diabetes, Chronic Kidney Disease/End Stage Renal Disease (ESRD), Hypertension (HTN)  General Interventions   General Interventions Discussed/Reviewed General Interventions Discussed, General Interventions Reviewed, Doctor Visits, Labs  Doctor Visits Discussed/Reviewed Doctor Visits Discussed, Doctor Visits Reviewed, Specialist  Education Interventions   Education Provided Provided Education  Provided Verbal Education On When to see the doctor, Mental Health/Coping with Illness, Nutrition, Labs, Blood Sugar Monitoring, Medication  Labs Reviewed Hgb A1c, Kidney Function  Mental Health Interventions   Mental Health Discussed/Reviewed Mental Health Discussed,  Mental Health Reviewed, Depression, Coping Strategies  Nutrition Interventions   Nutrition Discussed/Reviewed Nutrition Discussed, Nutrition Reviewed, Decreasing sugar intake  Pharmacy Interventions   Pharmacy Dicussed/Reviewed Pharmacy Topics Discussed, Pharmacy Topics Reviewed, Medications and their functions          SDOH assessments and interventions completed:  No     Care Coordination Interventions:  Yes, provided   Follow up plan: Follow up call scheduled for 11/06/22 @09 :30 AM    Encounter Outcome:  Pt. Visit Completed

## 2022-10-13 ENCOUNTER — Encounter: Payer: Self-pay | Admitting: Internal Medicine

## 2022-10-13 ENCOUNTER — Ambulatory Visit (INDEPENDENT_AMBULATORY_CARE_PROVIDER_SITE_OTHER): Payer: Medicare HMO | Admitting: Psychiatry

## 2022-10-13 DIAGNOSIS — Z630 Problems in relationship with spouse or partner: Secondary | ICD-10-CM

## 2022-10-13 DIAGNOSIS — R69 Illness, unspecified: Secondary | ICD-10-CM

## 2022-10-13 DIAGNOSIS — F331 Major depressive disorder, recurrent, moderate: Secondary | ICD-10-CM

## 2022-10-13 DIAGNOSIS — F4323 Adjustment disorder with mixed anxiety and depressed mood: Secondary | ICD-10-CM

## 2022-10-13 NOTE — Progress Notes (Signed)
Psychotherapy Progress Note [facility] [name], PhD LP  [name] [facility] "Teresa Golden")    MRN: [id number] Therapy format: Individual psychotherapy Date: 10/13/2022      Start: 9:15a     Stop: 10:05a     Time Spent: 50 min Location: In-person   Session narrative (presenting needs, interim history, self-report of stressors and symptoms, applications of prior therapy, status changes, and interventions made in session) Scheduled urgently, thinking she might need assessment for medication but thought and prayed instead, reassessed her experiences with Ray and [facility]ri Spore.  Realized she's lonely, and wanted to come ask about a women's group sh could be part of .  Before asking, she notes having looked online about setting up a nonprofit, in order to create the kind of group she wants.  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  Continues to go to a [employer], with background in [employer], which has carried a Administrator, Civil Service for higher education and status.  Recent experience at [facility] of being given a lap scarf by an usher due to her hem rising above her knee, and concerns with dress code, and some reservations about how they organize, though she is still attracted to good teaching, and she has a blind friend Marcelino Duster she's made there.  Friend Mabel in [place], not part of the [employer], able to drive, recruiting her to join the [employer] coming up.  Also [employer] and [employer] to choose from.    Offered choices in the 12-step tradition -- Al-Anon, CoDA, ACOA, EA -- and initially looked up available CoDA, her favorite.  Only in-person meeting is in [place], but website showed 63 online meetings a week, so referred there.  Also recommended [employer] for a variety of services, including phone companion and social engagements if interested.  Otherwise c/o podiatrist's office providing ragged  service, so changing to [facility].  Affirmed right to choose, and to trust podiatry over pedicure with toenail care, asa diabetic.  Therapeutic modalities: Cognitive Behavioral Therapy, Solution-Oriented/Positive Psychology, Environmental manager, and Faith-sensitive  Mental Status/Observations:  Appearance:   Casual     Behavior:  Appropriate  Motor:  Normal and exc Rollator  Speech/Language:   Clear and Coherent  Affect:  Appropriate  Mood:  dysthymic  Thought process:  circumstantial and somewhat  Thought content:    WNL and worry  Sensory/Perceptual disturbances:    WNL  Orientation:  Fully oriented  Attention:  Good    Concentration:  Fair  Memory:  WNL  Insight:    Good  Judgment:   Good  Impulse Control:  Fair   Risk Assessment: Danger to Self: No Self-injurious Behavior: No Danger to Others: No Physical Aggression / Violence: No Duty to Warn: No Access to Firearms a concern: No  Assessment of progress:  progressing  Diagnosis:   ICD-10-CM   1. Adjustment disorder with mixed anxiety and depressed mood  F43.23     2. Major depressive disorder, recurrent episode, moderate (HCC)  F33.1     3. Relationship problem between partners  Z63.0     4. Multiple comorbid health conditions  R69      Plan:  Socialization -- Continue involvement in church, other settings as able.  Look for good people she can develop friendships with, including if it means shopping other churches.  Offer to support groups such as CoDA or Al-Anon, and Geographical information systems officer. Relationships --  Endorse choice over relationships, so long as she can keep her integrity.  Notice and dispute any conclusion-jumping good or bad, and any desperate impulses, and practice the right to challenge any invalidating treatment.  Similarly, respect the fact that she's lonely and resist desperate thinking about getting involved. Self-esteem -- Per her faith, continue to practice self-esteem grounded in God's unconditional love.   Self-affirm that whatever she might deem "stupid", she has either learned, improved, or was doing the best she could figure out at the time, trying to deal with strong and natural feelings, e.g., loneliness.  When tempted to feel like a failure for lack of family, or for acting quickly on desires, reframe self-image as a survivor who had to take on the harshest of losses and is legitimately tempted.  Serve self reliably in housekeeping. General health -- Endorse any healthy activity, to include pool/gym if able and interested.  Address diabetes self-care reliably. Family -- Endorse boundary/limits with [name] Work -- Endorse option to work part-time if truly able and interested Living situation -- As needed, confer with landlady and research tenant's rights in the event of landowner mortgage default.  Resources may include Legal Aid, [employer], others.  Try to make a practice of cleaning up in smaller, more frequent efforts with th mindset that it's a gift she gives "the lady who lives here". Other recommendations/advice -- As may be noted above.  Continue to utilize previously learned skills ad lib. Medication compliance -- Maintain medication as prescribed and work faithfully with relevant prescriber(s) if any changes are desired or seem indicated. Crisis service -- Aware of call list and work-in appts.  Call the clinic on-call service, 988/hotline, 911, or present to [facility] or ER if any life-threatening psychiatric crisis. Followup -- Return for time as already scheduled.  Next scheduled visit with me 10/21/2022.  Next scheduled in this office 10/21/2022.  Robley Fries, PhD [name], PhD LP Clinical Psychologist, [facility] [address] [place], [place] [address] [phone]

## 2022-10-14 ENCOUNTER — Other Ambulatory Visit: Payer: Self-pay

## 2022-10-14 DIAGNOSIS — N289 Disorder of kidney and ureter, unspecified: Secondary | ICD-10-CM

## 2022-10-17 ENCOUNTER — Encounter: Payer: Self-pay | Admitting: Internal Medicine

## 2022-10-18 DIAGNOSIS — L659 Nonscarring hair loss, unspecified: Secondary | ICD-10-CM | POA: Insufficient documentation

## 2022-10-18 NOTE — Assessment & Plan Note (Signed)
She has h/o hypothyroidism. She was euthyroid in March 2024. I will check CBC and iron level. I will recommend iron supplementation if indicated. I will also refer her to Dermatology for further evaluation. She is in agreement with her treatment plan.

## 2022-10-18 NOTE — Assessment & Plan Note (Signed)
Chronic, previously followed by [facility] Hepatology clinic; however, she has not seen them since 2022.  Most recent RUQ u/s results reviewed in detail. Results significant for 4.2 cm echogenic mass in the right hepatic lobe. This is mildly increased in size however likely represents hemangioma. She will have repeat u/s in six months. Most recent Hepatology notes reviewed in Care Everywhere.

## 2022-10-18 NOTE — Assessment & Plan Note (Signed)
She is encouraged to strive for BMI less than 30 to decrease cardiac risk. Advised to aim for at least 150 minutes of exercise per week.  

## 2022-10-18 NOTE — Assessment & Plan Note (Addendum)
Chronic, she is currently taking Farxiga 10mg  and semaglutide 1mg  weekly. Importance of dietary/medication compliance was discussed with the patient in detail. I will adjust meds as needed after reviewing labs listed below.  She will f/u in three to four months.

## 2022-10-18 NOTE — Assessment & Plan Note (Signed)
Chronic, controlled.  She will continue with metoprolol succinate XL 25mg  daily at bedtime and losartan 100mg  daily. She is encouraged to follow low sodium diet.

## 2022-10-20 ENCOUNTER — Encounter: Payer: Self-pay | Admitting: Podiatry

## 2022-10-20 ENCOUNTER — Ambulatory Visit: Payer: Medicare HMO | Admitting: Podiatry

## 2022-10-20 DIAGNOSIS — M79674 Pain in right toe(s): Secondary | ICD-10-CM | POA: Diagnosis not present

## 2022-10-20 DIAGNOSIS — M2042 Other hammer toe(s) (acquired), left foot: Secondary | ICD-10-CM | POA: Diagnosis not present

## 2022-10-20 DIAGNOSIS — E1142 Type 2 diabetes mellitus with diabetic polyneuropathy: Secondary | ICD-10-CM

## 2022-10-20 DIAGNOSIS — M79675 Pain in left toe(s): Secondary | ICD-10-CM | POA: Diagnosis not present

## 2022-10-20 DIAGNOSIS — B351 Tinea unguium: Secondary | ICD-10-CM

## 2022-10-20 MED ORDER — GABAPENTIN 300 MG PO CAPS: 600.0000 mg | ORAL_CAPSULE | Freq: Every day | ORAL | 2 refills | Status: DC

## 2022-10-20 NOTE — Addendum Note (Signed)
Addended by: Louann Sjogren R on: 10/20/2022 09:48 AM   Modules accepted: Orders

## 2022-10-20 NOTE — Progress Notes (Signed)
  Subjective:  [name], female    DOB: [birth year],   MRN: [id number]  Chief Complaint  Patient presents with   Nail Problem    Pt  presents for concern of thickened painful nails that are difficult to trim.pt wants  to have them trimmed today    77 y.o. female presents for concern of thickened elongated and painful nails that are difficult to trim. Requesting to have them trimmed today. Relates burning and tingling in their feet. Patient is diabetic and last A1c was  Lab Results  Component Value Date   HGBA1C 6.6 (H) 10/07/2022   .  Also relates third digit and second digit hammertoes. Relates the third toe is painful and was to have tenotomy preformed.   PCP:  Dorothyann Peng, MD    . Denies any other pedal complaints. Denies n/v/f/c.   Past Medical History:  Diagnosis Date   Arthritis    Chronic kidney disease    self reports ckd stage 3    Depression    Diabetes mellitus, type II, insulin dependent (HCC)    With neurologic complications. 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    Hypertensive nephropathy 07/05/2018   Hypothyroidism    Hypothyroidism 02/06/2018   Morbid obesity with BMI of 50.0-59.9, adult (HCC)    Scoliosis     Objective:  Physical Exam: Vascular: DP/PT pulses 2/4 bilateral. CFT <3 seconds. Absent hair growth on digits. Edema noted to bilateral lower extremities. Xerosis noted bilaterally.  Skin. No lacerations or abrasions bilateral feet. Nails 1-5 bilateral  are thickened discolored and elongated with subungual debris. Hyperkeratotic tissue noted to distal left third digit.  Musculoskeletal: MMT 5/5 bilateral lower extremities in DF, PF, Inversion and Eversion. Deceased ROM in DF of ankle joint. HAV  deformity noted bilateral moderate on the left with crossover second toe and hammered third digit.  Neurological: Sensation intact to light touch. Protective sensation diminished bilateral.     Assessment:   1. Pain due to onychomycosis of toenails of both feet   2. Polyneuropathy due to type 2 diabetes mellitus (HCC)   3. Hammertoe of left foot      Plan:  Patient was evaluated and treated and all questions answered. -Discussed and educated patient on diabetic foot care, especially with  regards to the vascular, neurological and musculoskeletal systems.  -Stressed the importance of good glycemic control and the detriment of not  controlling glucose levels in relation to the foot. -Discussed supportive shoes at all times and checking feet regularly.  -Mechanically debrided all nails 1-5 bilateral using sterile nail nipper and filed with dremel without incident  -Answered all patient questions -Hyperkeratotic tissue debrided without incident.  -Did discussed possible tenotomy for left third toe and callus present at next appointment.  -Patient to return  in 3 months for at risk foot care -Patient advised to call the office if any problems or questions arise in the meantime.   Louann Sjogren, DPM

## 2022-10-21 ENCOUNTER — Ambulatory Visit: Payer: Medicare HMO | Admitting: Psychiatry

## 2022-10-21 ENCOUNTER — Encounter: Payer: Self-pay | Admitting: Internal Medicine

## 2022-10-21 DIAGNOSIS — R69 Illness, unspecified: Secondary | ICD-10-CM

## 2022-10-21 DIAGNOSIS — F3340 Major depressive disorder, recurrent, in remission, unspecified: Secondary | ICD-10-CM

## 2022-10-21 DIAGNOSIS — F4323 Adjustment disorder with mixed anxiety and depressed mood: Secondary | ICD-10-CM
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
## 2022-10-22 ENCOUNTER — Ambulatory Visit (INDEPENDENT_AMBULATORY_CARE_PROVIDER_SITE_OTHER): Payer: Medicare HMO

## 2022-10-22 DIAGNOSIS — Z0000 Encounter for general adult medical examination without abnormal findings: Secondary | ICD-10-CM

## 2022-10-22 NOTE — Progress Notes (Signed)
Subjective:   Teresa Golden is a 77 y.o. female who presents for Medicare Annual (Subsequent) preventive examination.  Visit Complete: Virtual  I connected with  Teresa Golden on 10/22/22 by a audio enabled telemedicine application and verified that I am speaking with the correct person using two identifiers.  Patient Location: Home  Provider Location: Office/Clinic  I discussed the limitations of evaluation and management by telemedicine. The patient expressed understanding and agreed to proceed.  Vital Signs: Unable to obtain new vitals due to this being a telehealth visit.  Review of Systems     Cardiac Risk Factors include: advanced age (>18men, >21 women);diabetes mellitus;hypertension     Objective:    Today's Vitals   There is no height or weight on file to calculate BMI.     10/22/2022    2:15 PM 10/10/2021    2:44 PM 03/28/2021    9:20 AM 11/03/2020    2:00 AM 11/03/2020   12:51 AM 10/29/2020    3:02 PM 10/13/2020   12:00 PM  Advanced Directives  Does Patient Have a Medical Advance Directive? Yes Yes Yes  Yes Yes Yes  Type of Estate agent of [place];Living will Healthcare Power of [place];Living will  Healthcare Power of [place];Living will  Healthcare Power of [place];Living will Healthcare Power of Attorney  Does patient want to make changes to medical advance directive?    No - Patient declined  No - Patient declined No - Patient declined  Copy of Healthcare Power of Attorney in Chart? No - copy requested Yes - validated most recent copy scanned in chart (See row information)    No - copy requested, Physician notified No - copy requested    Current Medications (verified) Outpatient Encounter Medications as of 10/22/2022  Medication Sig   allopurinol (ZYLOPRIM) 100 MG tablet Take 1 tablet (100 mg total) by mouth daily.   amLODipine (NORVASC) 5 MG tablet Take 1 tablet by mouth once daily   AZO-CRANBERRY PO Take 1 tablet by mouth daily.    Biotin 40981 MCG TABS Take 10,000 mcg by mouth daily.   cetirizine (ZYRTEC) 10 MG tablet Take 10 mg by mouth at bedtime.   Cholecalciferol 5000 units TABS Take 5,000 Units by mouth daily.   dapagliflozin propanediol (FARXIGA) 10 MG TABS tablet Take 1 tablet (10 mg total) by mouth daily.   diclofenac Sodium (VOLTAREN) 1 % GEL APPLY 2 GRAMS TO AFFECTED AREA(S) 4 TIMES DAILY AS NEEDED (Patient taking differently: Apply 2-4 g topically every 6 (six) hours as needed (to affected areas).)   gabapentin (NEURONTIN) 300 MG capsule Take 2 capsules (600 mg total) by mouth at bedtime.   glucose blood (ONETOUCH VERIO) test strip USE TO CHECK BLOOD SUGAR   TWO TIMES A DAY AS         INSTRUCTED   Insulin Degludec (TRESIBA) 100 UNIT/ML SOLN Inject 10 Units/oz/day into the skin at bedtime. Patient is taking at bedtime   Lancets (ONETOUCH DELICA PLUS LANCET33G) MISC USE TO CHECK BLOOD SUGARS  TWO TIMES A DAY AS         INSTRUCTED   levothyroxine (SYNTHROID) 25 MCG tablet Take 1/2 tablet daily.   losartan (COZAAR) 100 MG tablet Take 1 tablet (100 mg total) by mouth daily.   metoprolol succinate (TOPROL-XL) 25 MG 24 hr tablet TAKE 1 TABLET AT BEDTIME   (DISCONTINUE COREG)   Multiple Vitamins-Minerals (MULTIVITAMIN WITH MINERALS) tablet Take 1 tablet by mouth daily. Women's 50+  pantoprazole (PROTONIX) 40 MG tablet Take 40 mg by mouth daily as needed.   Semaglutide, 1 MG/DOSE, (OZEMPIC, 1 MG/DOSE,) 4 MG/3ML SOPN Inject 1 mg into the skin once a week.   simvastatin (ZOCOR) 10 MG tablet Take 1 tablet (10 mg total) by mouth daily.   B Complex-C (B-COMPLEX WITH VITAMIN C) tablet Take 1 tablet by mouth 3 (three) times a week. (Patient not taking: Reported on 10/22/2022)   colchicine 0.6 MG tablet Take 1 tablet by mouth for gout attack. (Patient not taking: Reported on 10/22/2022)   [DISCONTINUED] SEMGLEE, YFGN, 100 UNIT/ML Pen Inject 9 Units into the skin at bedtime.   No facility-administered encounter medications on file  as of 10/22/2022.    Allergies (verified) Meloxicam and Other   History: Past Medical History:  Diagnosis Date   Arthritis    Chronic kidney disease    self reports ckd stage 3    Depression    Diabetes mellitus, type II, insulin dependent (HCC)    With neurologic complications. 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    Hypertensive nephropathy 07/05/2018   Hypothyroidism    Hypothyroidism 02/06/2018   Morbid obesity with BMI of 50.0-59.9, adult (HCC)    Scoliosis    Past Surgical History:  Procedure Laterality Date   ABDOMINAL HYSTERECTOMY     uterus   CHOLECYSTECTOMY     DIAGNOSTIC LAPAROSCOPY     DILATION AND CURETTAGE OF UTERUS     EYE SURGERY     cataract extraction bilateral    INCISION AND DRAINAGE OF WOUND N/A 10/15/2020   Procedure: IRRIGATION AND DEBRIDEMENT WOUND;  Surgeon: Peggye Form, DO;  Location: [facility] OR;  Service: Plastics;  Laterality: N/A;  60 min   JOINT REPLACEMENT     Left knee   KNEE ARTHROPLASTY Right 07/30/2017   Procedure: RIGHT TOTAL KNEE ARTHROPLASTY WITH COMPUTER NAVIGATION;  Surgeon: Samson Frederic, MD;  Location: [facility] ORS;  Service: Orthopedics;  Laterality: Right;  Needs RNFA   LAPAROSCOPIC GASTRIC BANDING     and reversal   LAPAROSCOPIC GASTRIC SLEEVE RESECTION N/A 08/26/2016   Procedure: LAPAROSCOPIC GASTRIC SLEEVE RESECTION WITH UPPER ENOD;  Surgeon: Luretha Murphy, MD;  Location: [facility] ORS;  Service: General;  Laterality: N/A;   PANNICULECTOMY N/A 09/26/2020   Procedure: PANNICULECTOMY;  Surgeon: Peggye Form, DO;  Location: [facility] OR;  Service: Plastics;  Laterality: N/A;   TONSILLECTOMY     TRANSTHORACIC ECHOCARDIOGRAM  11/2013   EF 65-70%. Normal diastolic Fxn.  Normal Valves.    UMBILICAL HERNIA REPAIR N/A 09/26/2020   Procedure: HERNIA REPAIR UMBILICAL ADULT;  Surgeon: Violeta Gelinas, MD;  Location: [facility];  Service: General;  Laterality: N/A;   Family History  Problem Relation Age of Onset   Heart attack Mother    Heart failure Mother    Stroke Father    Social History   Socioeconomic History   Marital status: Single    Spouse name: Not on file   Number of children: Not on file   Years of education: Not on file   Highest education level: Not on file  Occupational History   Occupation: retired  Tobacco Use   Smoking status: Former    Current packs/day:  0.25    Average packs/day: 0.3 packs/day for 10.0 years (2.5 ttl pk-yrs)    Types: Cigarettes   Smokeless tobacco: Never   Tobacco comments:    quit 20 years ago  Vaping Use   Vaping status: Never Used  Substance and Sexual Activity   Alcohol use: Not Currently    Alcohol/week: 1.0 standard drink of alcohol    Types: 1 Glasses of wine per week    Comment: occasional   Drug use: No   Sexual activity: Yes  Other Topics Concern   Not on file  Social History Narrative   Not on file   Social Determinants of Health   Financial Resource Strain: Low Risk  (10/22/2022)   Overall Financial Resource Strain (CARDIA)    Difficulty of Paying Living Expenses: Not hard at all  Food Insecurity: No Food Insecurity (10/22/2022)   Hunger Vital Sign    Worried About Running Out of Food in the Last Year: Never true    Ran Out of Food in the Last Year: Never true  Transportation Needs: No Transportation Needs (10/22/2022)   PRAPARE - Administrator, Civil Service (Medical): No    Lack of Transportation (Non-Medical): No  Physical Activity: Inactive (10/22/2022)   Exercise Vital Sign    Days of Exercise per Week: 0 days    Minutes of Exercise per Session: 0 min  Stress: Stress Concern Present (10/22/2022)   [employer] of Occupational Health - Occupational Stress Questionnaire    Feeling of  Stress : To some extent  Social Connections: Moderately Isolated (10/22/2022)   Social Connection and Isolation Panel [NHANES]    Frequency of Communication with Friends and Family: More than three times a week    Frequency of Social Gatherings with Friends and Family: Never    Attends Religious Services: More than 4 times per year    Active Member of [employer] or Organizations: No    Attends Banker Meetings: Never    Marital Status: Widowed    Tobacco Counseling Counseling given: Not Answered Tobacco comments: quit 20 years ago   Clinical Intake:  Pre-visit preparation completed: Yes  Pain : No/denies pain     Nutritional Risks: None Diabetes: Yes CBG done?: No Did pt. bring in CBG monitor from home?: No  How often do you need to have someone help you when you read instructions, pamphlets, or other written materials from your doctor or pharmacy?: 1 - Never  Interpreter Needed?: No  Information entered by :: NAllen LPN   Activities of Daily Living    10/22/2022    2:01 PM  In your present state of health, do you have any difficulty performing the following activities:  Hearing? 0  Vision? 0  Difficulty concentrating or making decisions? 0  Walking or climbing stairs? 1  Dressing or bathing? 0  Doing errands, shopping? 0  Preparing Food and eating ? N  Using the Toilet? N  In the past six months, have you accidently leaked urine? Y  Comment wears pull ups  Do you have problems with loss of bowel control? N  Managing your Medications? N  Managing your Finances? N  Housekeeping or managing your Housekeeping? N    Patient Care Team: Dorothyann Peng, MD as PCP - General (Internal Medicine) Parke Poisson, MD as PCP - Cardiology (Cardiology) Alden Hipp, RPH-CPP (Pharmacist) Clarene Duke Karma Lew, RN as [facility] Care Management  Indicate any recent Medical Services you may have  received from other than [facility] providers in the past  year (date may be approximate).     Assessment:   This is a routine wellness examination for Teresa Golden.  Hearing/Vision screen Hearing Screening - Comments:: Denies hearing issues Vision Screening - Comments:: Regular eye exams, [facility]  Dietary issues and exercise activities discussed:     Goals Addressed             This Visit's Progress    Patient Stated       10/22/2022, wants to get off insulin       Depression Screen    10/22/2022    2:16 PM 10/07/2022    2:09 PM 07/28/2022    9:31 AM 06/23/2022    2:35 PM 05/14/2022   11:08 AM 04/30/2022    9:20 AM 10/10/2021    2:46 PM  PHQ 2/9 Scores  PHQ - 2 Score 3 0 0 0 0 0 0  PHQ- 9 Score 9 0 0 0       Fall Risk    10/22/2022    2:15 PM 10/07/2022    2:09 PM 07/28/2022    9:31 AM 06/23/2022    2:35 PM 05/14/2022   11:08 AM  Fall Risk   Falls in the past year? 0 0 0 0 0  Number falls in past yr: 0 0 0 0   Injury with Fall? 0 0 0 0   Risk for fall due to : Medication side effect No Fall Risks No Fall Risks No Fall Risks   Follow up Falls prevention discussed;Falls evaluation completed Falls evaluation completed Falls evaluation completed Falls evaluation completed     MEDICARE RISK AT HOME: Medicare Risk at Home Any stairs in or around the home?: No If so, are there any without handrails?: No Home free of loose throw rugs in walkways, pet beds, electrical cords, etc?: Yes Adequate lighting in your home to reduce risk of falls?: Yes Life alert?: No Use of a cane, walker or w/c?: Yes Grab bars in the bathroom?: No Shower chair or bench in shower?: Yes Elevated toilet seat or a handicapped toilet?: Yes  TIMED UP AND GO:  Was the test performed?  No    Cognitive Function:        10/22/2022    2:18 PM 10/10/2021    2:48 PM 09/20/2020    2:31 PM 08/18/2019   12:26 PM 02/08/2019    9:45 AM  6CIT Screen  What Year? 0 points 0 points 0 points 0 points 0 points  What month? 0 points 0 points 0 points 0 points 0 points   What time? 0 points 3 points 0 points 0 points 0 points  Count back from 20 0 points 0 points 0 points 0 points 0 points  Months in reverse 0 points 0 points 0 points 0 points 0 points  Repeat phrase 0 points 0 points 0 points 0 points 0 points  Total Score 0 points 3 points 0 points 0 points 0 points    Immunizations Immunization History  Administered Date(s) Administered   Fluad Quad(high Dose 65+) 11/10/2018, 11/29/2019, 11/22/2020, 12/17/2021   Influenza, High Dose Seasonal PF 12/21/2017, 11/10/2018   Influenza,inj,quad, With Preservative 02/25/2016   Influenza-Unspecified 10/25/2016, 10/25/2020   Moderna Sars-Covid-2 Vaccination 03/28/2019, 04/25/2019, 01/02/2020, 07/06/2020, 01/02/2022   PNEUMOCOCCAL CONJUGATE-20 08/03/2020   Pneumococcal Polysaccharide-23 11/02/2013   Pneumococcal-Unspecified 11/24/2016   Tdap 07/04/2015   Zoster Recombinant(Shingrix) 11/03/2018, 12/31/2018    TDAP status: Up to  date  Flu Vaccine status: Due, Education has been provided regarding the importance of this vaccine. Advised may receive this vaccine at local pharmacy or Health Dept. Aware to provide a copy of the vaccination record if obtained from local pharmacy or Health Dept. Verbalized acceptance and understanding.  Pneumococcal vaccine status: Up to date  Covid-19 vaccine status: Completed vaccines  Qualifies for Shingles Vaccine? Yes   Zostavax completed Yes   Shingrix Completed?: Yes  Screening Tests Health Maintenance  Topic Date Due   COVID-19 Vaccine (6 - 2023-24 season) 02/27/2022   INFLUENZA VACCINE  09/25/2022   OPHTHALMOLOGY EXAM  03/05/2023   HEMOGLOBIN A1C  04/09/2023   Diabetic kidney evaluation - Urine ACR  06/23/2023   FOOT EXAM  06/23/2023   Diabetic kidney evaluation - eGFR measurement  10/07/2023   Medicare Annual Wellness (AWV)  10/22/2023   DTaP/Tdap/Td (2 - Td or Tdap) 07/03/2025   Pneumonia Vaccine 34+ Years old  Completed   DEXA SCAN  Completed   Hepatitis  C Screening  Completed   Zoster Vaccines- Shingrix  Completed   HPV VACCINES  Aged Out   Colonoscopy  Discontinued    Health Maintenance  Health Maintenance Due  Topic Date Due   COVID-19 Vaccine (6 - 2023-24 season) 02/27/2022   INFLUENZA VACCINE  09/25/2022    Colorectal cancer screening: No longer required.   Mammogram status: Completed 08/07/2022. Repeat every year  Bone Density status: scheduled for 01/27/2023  Lung Cancer Screening: (Low Dose CT Chest recommended if Age 56-80 years, 20 pack-year currently smoking OR have quit w/in 15years.) does not qualify.   Lung Cancer Screening Referral: no  Additional Screening:  Hepatitis C Screening: does qualify; Completed 05/31/2020  Vision Screening: Recommended annual ophthalmology exams for early detection of glaucoma and other disorders of the eye. Is the patient up to date with their annual eye exam?  Yes  Who is the provider or what is the name of the office in which the patient attends annual eye exams? [facility] If pt is not established with a provider, would they like to be referred to a provider to establish care? No .   Dental Screening: Recommended annual dental exams for proper oral hygiene  Diabetic Foot Exam: Diabetic Foot Exam: Completed 06/23/2022  Community Resource Referral / Chronic Care Management: CRR required this visit?  No   CCM required this visit?  No     Plan:     I have personally reviewed and noted the following in the patient's chart:   Medical and social history Use of alcohol, tobacco or illicit drugs  Current medications and supplements including opioid prescriptions. Patient is not currently taking opioid prescriptions. Functional ability and status Nutritional status Physical activity Advanced directives List of other physicians Hospitalizations, surgeries, and ER visits in previous 12 months Vitals Screenings to include cognitive, depression, and falls Referrals and  appointments  In addition, I have reviewed and discussed with patient certain preventive protocols, quality metrics, and best practice recommendations. A written personalized care plan for preventive services as well as general preventive health recommendations were provided to patient.     Barb Merino, LPN   [id number]/[id number]   After Visit Summary: (MyChart) Due to this being a telephonic visit, the after visit summary with patients personalized plan was offered to patient via MyChart   Nurse Notes: none

## 2022-10-22 NOTE — Patient Instructions (Addendum)
Teresa Golden , Thank you for taking time to come for your Medicare Wellness Visit. I appreciate your ongoing commitment to your health goals. Please review the following plan we discussed and let me know if I can assist you in the future.   Referrals/Orders/Follow-Ups/Clinician Recommendations: trouble sleeping. Will talk to Dr. Allyne Gee at next appointment  This is a list of the screening recommended for you and due dates:  Health Maintenance  Topic Date Due   COVID-19 Vaccine (6 - 2023-24 season) 02/27/2022   Flu Shot  09/25/2022   Eye exam for diabetics  03/05/2023   Hemoglobin A1C  04/09/2023   Yearly kidney health urinalysis for diabetes  06/23/2023   Complete foot exam   06/23/2023   Yearly kidney function blood test for diabetes  10/07/2023   Medicare Annual Wellness Visit  10/22/2023   DTaP/Tdap/Td vaccine (2 - Td or Tdap) 07/03/2025   Pneumonia Vaccine  Completed   DEXA scan (bone density measurement)  Completed   Hepatitis C Screening  Completed   Zoster (Shingles) Vaccine  Completed   HPV Vaccine  Aged Out   Colon Cancer Screening  Discontinued    Advanced directives: (Copy Requested) Please bring a copy of your health care power of attorney and living will to the office to be added to your chart at your convenience.  Next Medicare Annual Wellness Visit scheduled for next year: No, office will schedule   Insert Preventive Care attachment Insert FALL PREVENTION attachment if needed

## 2022-10-23 ENCOUNTER — Ambulatory Visit: Payer: Medicare HMO

## 2022-10-23 NOTE — Patient Instructions (Signed)
Visit Information  Thank you for taking time to visit with me today. Please don't hesitate to contact me if I can be of assistance to you.   Following are the goals we discussed today:   Goals Addressed               This Visit's Progress     Patient Stated     I want to lower my A1c to 6.0 (pt-stated)   On track     Care Coordination Interventions: Evaluation of current treatment plan related to type 2 diabetes and patient's adherence to plan as established by provider Completed inbound call from patient requesting guidance on use of her Josephine Igo 3 sensor Determined patient is having issues with the device adhering to her arm and or half through her 14 day cycle the sensor will lose transmission and will not read her sugars Determined patient has a lab visit scheduled for 10/29/22 @09 :00 AM at which time she will take in a new sensor for demonstration on usage with a nurse Discussed patient may contact her health plan to ask if they will cover a Dexcom and if so she may ask her PCP to switch her Rx to a Dexcom Discussed patient plans to perform finger sticks until she gets this issue resolved Lab Results  Component Value Date   HGBA1C 6.6 (H) 10/07/2022         Our next appointment is by telephone on 11/06/22 at 09:30 AM  Please call the care guide team at [phone] if you need to cancel or reschedule your appointment.   If you are experiencing a Mental Health or Behavioral Health Crisis or need someone to talk to, please call 1-800-273-TALK (toll free, 24 hour hotline)  Patient verbalizes understanding of instructions and care plan provided today and agrees to view in MyChart. Active MyChart status and patient understanding of how to access instructions and care plan via MyChart confirmed with patient.     Delsa Sale, RN, BSN, CCM Care Management Coordinator [facility] Care Management Direct Phone: [phone]

## 2022-10-23 NOTE — Patient Outreach (Signed)
  Care Coordination   Follow Up Visit Note   10/23/2022 Name: Teresa Golden MRN: [id number] DOB: 1945-05-11  Teresa Golden is a 77 y.o. year old female who sees Dorothyann Peng, MD for primary care. I spoke with  Margrett Rud by phone today.  What matters to the patients health and wellness today?  Patient will demonstrate application of Libre 3 at PCP office and or switch to a Dexcom if her health plan will cover.     Goals Addressed               This Visit's Progress     Patient Stated     I want to lower my A1c to 6.0 (pt-stated)   On track     Care Coordination Interventions: Evaluation of current treatment plan related to type 2 diabetes and patient's adherence to plan as established by provider Completed inbound call from patient requesting guidance on use of her Josephine Igo 3 sensor Determined patient is having issues with the device adhering to her arm and or half through her 14 day cycle the sensor will lose transmission and will not read her sugars Determined patient has a lab visit scheduled for 10/29/22 @09 :00 AM at which time she will take in a new sensor for demonstration on usage with a nurse Discussed patient may contact her health plan to ask if they will cover a Dexcom and if so she may ask her PCP to switch her Rx to a Dexcom Discussed patient plans to perform finger sticks until she gets this issue resolved Lab Results  Component Value Date   HGBA1C 6.6 (H) 10/07/2022          SDOH assessments and interventions completed:  No     Care Coordination Interventions:  Yes, provided   Follow up plan: Follow up call scheduled for 11/06/22 @09 :30 AM    Encounter Outcome:  Pt. Visit Completed

## 2022-10-29 ENCOUNTER — Other Ambulatory Visit: Payer: Self-pay | Admitting: Internal Medicine

## 2022-10-29 ENCOUNTER — Encounter (INDEPENDENT_AMBULATORY_CARE_PROVIDER_SITE_OTHER): Payer: Medicare HMO | Admitting: Family Medicine

## 2022-10-29 ENCOUNTER — Ambulatory Visit (INDEPENDENT_AMBULATORY_CARE_PROVIDER_SITE_OTHER): Payer: Medicare HMO | Admitting: Family Medicine

## 2022-10-29 ENCOUNTER — Other Ambulatory Visit: Payer: Self-pay

## 2022-10-29 ENCOUNTER — Encounter: Payer: Self-pay | Admitting: Family Medicine

## 2022-10-29 VITALS — BP 126/84 | HR 65 | Temp 99.4°F | Ht 65.0 in | Wt 199.0 lb

## 2022-10-29 DIAGNOSIS — W57XXXA Bitten or stung by nonvenomous insect and other nonvenomous arthropods, initial encounter: Secondary | ICD-10-CM | POA: Diagnosis not present

## 2022-10-29 DIAGNOSIS — L282 Other prurigo: Secondary | ICD-10-CM | POA: Diagnosis not present

## 2022-10-29 DIAGNOSIS — C220 Liver cell carcinoma: Secondary | ICD-10-CM

## 2022-10-29 MED ORDER — BETAMETHASONE DIPROPIONATE 0.05 % EX CREA
TOPICAL_CREAM | Freq: Two times a day (BID) | CUTANEOUS | 0 refills | Status: DC
Start: 2022-10-29 — End: 2023-07-23

## 2022-10-29 MED ORDER — ALLOPURINOL 100 MG PO TABS: 100.0000 mg | ORAL_TABLET | Freq: Every day | ORAL | 2 refills | Status: DC

## 2022-10-29 MED ORDER — CETIRIZINE HCL 10 MG PO TABS
10.0000 mg | ORAL_TABLET | Freq: Every day | ORAL | 1 refills | Status: AC
Start: 2022-10-29 — End: ?

## 2022-10-29 NOTE — Progress Notes (Signed)
I,Jameka J Llittleton, CMA,acting as a Neurosurgeon for [employer], NP.,have documented all relevant documentation on the behalf of [name], NP,as directed by  [name], NP while in the presence of [name], NP.  Subjective:  [name] , female    DOB: 1945/12/29 , 77 y.o.   MRN: [id number]  Chief Complaint  Patient presents with   Insect Bite    HPI  Patient presents today for insect bites on multiple places on her body.Patient said she went to her neighbor's yard about 1 week ago and was bitten by insects which she thinks maybe have been mosquitoes. Patient states she noticed raised bumps on her left hand and left legs. Areas are really itchy and red but she said it has gotten better and has not spread to any other areas on the body. She said she has used OTC hydrocortisone and it did not help.     Past Medical History:  Diagnosis Date   Arthritis    Chronic kidney disease    self reports ckd stage 3    Depression    Diabetes mellitus, type II, insulin dependent (HCC)    With neurologic complications. 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    Hypertensive nephropathy 07/05/2018   Hypothyroidism    Hypothyroidism 02/06/2018   Morbid obesity with BMI of 50.0-59.9, adult (HCC)    Scoliosis      Family History  Problem Relation Age of Onset   Heart attack Mother    Heart failure Mother    Stroke Father      Current Outpatient Medications:    amLODipine (NORVASC) 5 MG tablet, Take 1 tablet by mouth once daily, Disp: 90 tablet, Rfl: 0   AZO-CRANBERRY PO, Take 1 tablet by mouth daily., Disp: , Rfl:    B Complex-C (B-COMPLEX WITH VITAMIN C) tablet, Take 1 tablet by mouth 3 (three) times a week., Disp: , Rfl:     betamethasone dipropionate 0.05 % cream, Apply topically 2 (two) times daily., Disp: 30 g, Rfl: 0   Biotin 40981 MCG TABS, Take 10,000 mcg by mouth daily., Disp: , Rfl:    Cholecalciferol 5000 units TABS, Take 5,000 Units by mouth daily., Disp: , Rfl:    colchicine 0.6 MG tablet, Take 1 tablet by mouth for gout attack., Disp: 30 tablet, Rfl: 1   dapagliflozin propanediol (FARXIGA) 10 MG TABS tablet, Take 1 tablet (10 mg total) by mouth daily., Disp: 90 tablet, Rfl: 3   diclofenac Sodium (VOLTAREN) 1 % GEL, APPLY 2 GRAMS TO AFFECTED AREA(S) 4 TIMES DAILY AS NEEDED (Patient taking differently: Apply 2-4 g topically every 6 (six) hours as needed (to affected areas).), Disp: 200 g, Rfl: 2   gabapentin (NEURONTIN) 300 MG capsule, Take 2 capsules (600 mg total) by mouth at bedtime., Disp: 60 capsule, Rfl: 2   glucose blood (ONETOUCH VERIO) test strip, USE TO CHECK BLOOD SUGAR   TWO TIMES A DAY AS         INSTRUCTED, Disp: 200 strip, Rfl: 3   Insulin Degludec (TRESIBA) 100 UNIT/ML SOLN, Inject 10 Units/oz/day into the skin at bedtime. Patient is taking at bedtime, Disp: , Rfl:    Lancets [facility]  PLUS LANCET33G) MISC, USE TO CHECK BLOOD SUGARS  TWO TIMES A DAY AS         INSTRUCTED, Disp: 200 each, Rfl: 3   levothyroxine (SYNTHROID) 25 MCG tablet, Take 1/2 tablet daily., Disp: 90 tablet, Rfl: 1   losartan (COZAAR) 100 MG tablet, Take 1 tablet (100 mg total) by mouth daily., Disp: 90 tablet, Rfl: 2   metoprolol succinate (TOPROL-XL) 25 MG 24 hr tablet, TAKE 1 TABLET AT BEDTIME   (DISCONTINUE COREG), Disp: 90 tablet, Rfl: 1   Multiple Vitamins-Minerals (MULTIVITAMIN WITH MINERALS) tablet, Take 1 tablet by mouth daily. Women's 50+, Disp: , Rfl:    pantoprazole (PROTONIX) 40 MG tablet, Take 40 mg by mouth daily as needed., Disp: , Rfl:    Semaglutide, 1 MG/DOSE, (OZEMPIC, 1 MG/DOSE,) 4 MG/3ML SOPN, Inject 1 mg into the skin once a week., Disp: 9 mL, Rfl: 1   simvastatin (ZOCOR) 10 MG tablet, Take 1  tablet (10 mg total) by mouth daily., Disp: 90 tablet, Rfl: 0   allopurinol (ZYLOPRIM) 100 MG tablet, Take 1 tablet (100 mg total) by mouth daily., Disp: 90 tablet, Rfl: 2   cetirizine (ZYRTEC) 10 MG tablet, Take 1 tablet (10 mg total) by mouth daily., Disp: 30 tablet, Rfl: 1   Allergies  Allergen Reactions   Meloxicam Other (See Comments)    Fever; muscle aches; "flu-like" symptoms "Allergic," per [facility]   Other Other (See Comments)    "Rose fever and hay fever"     Review of Systems  Constitutional: Negative.   HENT:  Negative for congestion and sinus pressure.   Eyes: Negative.   Musculoskeletal: Negative.   Skin:  Positive for rash.  Psychiatric/Behavioral: Negative.       Today's Vitals   10/29/22 0848 10/29/22 0936  BP: (!) 140/90 126/84  Pulse: 65   Temp: 99.4 F (37.4 C)   Weight: 199 lb (90.3 kg)   Height: 5\' 5"  (1.651 m)   PainSc: 0-No pain 0-No pain   Body mass index is 33.12 kg/m.  Wt Readings from Last 3 Encounters:  10/29/22 199 lb (90.3 kg)  10/07/22 199 lb 6.4 oz (90.4 kg)  07/28/22 202 lb 9.6 oz (91.9 kg)    Objective:  Physical Exam HENT:     Head: Normocephalic.  Skin:    Findings: Erythema and rash present.  Neurological:     Mental Status: She is alert and oriented to person, place, and time.  Psychiatric:        Mood and Affect: Mood normal.        Behavior: Behavior normal.         Assessment And Plan:  Multiple insect bites -     Betamethasone Dipropionate; Apply topically 2 (two) times daily.  Dispense: 30 g; Refill: 0  Pruritic rash -     Cetirizine HCl; Take 1 tablet (10 mg total) by mouth daily.  Dispense: 30 tablet; Refill: 1    Return if symptoms worsen or fail to improve, for keep scheduled appointment.  Patient was given opportunity to ask questions. Patient verbalized understanding of the plan and was able to repeat key elements of the plan. All questions were answered to their satisfaction.    I, [name], NP, have  reviewed all documentation for this visit. The documentation on 10/29/22 for the exam, diagnosis, procedures, and orders are all accurate and complete.   IF YOU HAVE BEEN REFERRED TO A SPECIALIST, IT MAY TAKE 1-2 WEEKS TO SCHEDULE/PROCESS THE REFERRAL.  IF YOU HAVE NOT HEARD FROM US/SPECIALIST IN TWO WEEKS, PLEASE GIVE [place] A CALL AT [phone] X [phone].

## 2022-10-29 NOTE — Progress Notes (Deleted)
I,Curtis Cain T Reveca Desmarais, CMA,acting as a Neurosurgeon for [employer] documented all relevant documentation on the behalf of TIMA-NURSE,as directed by  [facility] while in the presence of TIMA-NURSE.  Subjective:  [name] , female    DOB: 16-Mar-1945 , 77 y.o.   MRN: [id number]  No chief complaint on file.   HPI  HPI   Past Medical History:  Diagnosis Date   Arthritis    Chronic kidney disease    self reports ckd stage 3    Depression    Diabetes mellitus, type II, insulin dependent (HCC)    With neurologic complications. 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    Hypertensive nephropathy 07/05/2018   Hypothyroidism    Hypothyroidism 02/06/2018   Morbid obesity with BMI of 50.0-59.9, adult (HCC)    Scoliosis      Family History  Problem Relation Age of Onset   Heart attack Mother    Heart failure Mother    Stroke Father      Current Outpatient Medications:    allopurinol (ZYLOPRIM) 100 MG tablet, Take 1 tablet (100 mg total) by mouth daily., Disp: 90 tablet, Rfl: 2   amLODipine (NORVASC) 5 MG tablet, Take 1 tablet by mouth once daily, Disp: 90 tablet, Rfl: 0   AZO-CRANBERRY PO, Take 1 tablet by mouth daily., Disp: , Rfl:    B Complex-C (B-COMPLEX WITH VITAMIN C) tablet, Take 1 tablet by mouth 3 (three) times a week., Disp: , Rfl:    betamethasone dipropionate 0.05 % cream, Apply topically 2 (two) times daily., Disp: 30 g, Rfl: 0   Biotin 01027 MCG TABS, Take 10,000 mcg by mouth daily., Disp: , Rfl:    cetirizine (ZYRTEC) 10 MG tablet, Take 1 tablet (10 mg total) by mouth daily., Disp: 30 tablet, Rfl: 1   Cholecalciferol 5000 units TABS, Take 5,000 Units by mouth daily., Disp: , Rfl:    colchicine 0.6 MG tablet, Take 1  tablet by mouth for gout attack., Disp: 30 tablet, Rfl: 1   dapagliflozin propanediol (FARXIGA) 10 MG TABS tablet, Take 1 tablet (10 mg total) by mouth daily., Disp: 90 tablet, Rfl: 3   diclofenac Sodium (VOLTAREN) 1 % GEL, APPLY 2 GRAMS TO AFFECTED AREA(S) 4 TIMES DAILY AS NEEDED (Patient taking differently: Apply 2-4 g topically every 6 (six) hours as needed (to affected areas).), Disp: 200 g, Rfl: 2   gabapentin (NEURONTIN) 300 MG capsule, Take 2 capsules (600 mg total) by mouth at bedtime., Disp: 60 capsule, Rfl: 2   glucose blood (ONETOUCH VERIO) test strip, USE TO CHECK BLOOD SUGAR   TWO TIMES A DAY AS         INSTRUCTED, Disp: 200 strip, Rfl: 3   Insulin Degludec (TRESIBA) 100 UNIT/ML SOLN, Inject 10 Units/oz/day into the skin at bedtime. Patient is taking at bedtime, Disp: , Rfl:    Lancets (ONETOUCH DELICA PLUS LANCET33G) MISC, USE TO CHECK BLOOD SUGARS  TWO TIMES A DAY AS         INSTRUCTED, Disp: 200 each, Rfl: 3   levothyroxine (SYNTHROID) 25 MCG tablet, Take 1/2 tablet daily., Disp: 90 tablet, Rfl: 1   losartan (COZAAR) 100 MG tablet, Take 1 tablet (100 mg total)  by mouth daily., Disp: 90 tablet, Rfl: 2   metoprolol succinate (TOPROL-XL) 25 MG 24 hr tablet, TAKE 1 TABLET AT BEDTIME   (DISCONTINUE COREG), Disp: 90 tablet, Rfl: 1   Multiple Vitamins-Minerals (MULTIVITAMIN WITH MINERALS) tablet, Take 1 tablet by mouth daily. Women's 50+, Disp: , Rfl:    pantoprazole (PROTONIX) 40 MG tablet, Take 40 mg by mouth daily as needed., Disp: , Rfl:    Semaglutide, 1 MG/DOSE, (OZEMPIC, 1 MG/DOSE,) 4 MG/3ML SOPN, Inject 1 mg into the skin once a week., Disp: 9 mL, Rfl: 1   simvastatin (ZOCOR) 10 MG tablet, Take 1 tablet (10 mg total) by mouth daily., Disp: 90 tablet, Rfl: 0   Allergies  Allergen Reactions   Meloxicam Other (See Comments)    Fever; muscle aches; "flu-like" symptoms "Allergic," per [facility]   Other Other (See Comments)    "Rose fever and hay fever"     Review of Systems   There  were no vitals filed for this visit. There is no height or weight on file to calculate BMI.  Wt Readings from Last 3 Encounters:  10/29/22 199 lb (90.3 kg)  10/07/22 199 lb 6.4 oz (90.4 kg)  07/28/22 202 lb 9.6 oz (91.9 kg)     Objective:  Physical Exam      Assessment And Plan:  Hepatoma (HCC) -     AFP tumor marker     No follow-ups on file.  Patient was given opportunity to ask questions. Patient verbalized understanding of the plan and was able to repeat key elements of the plan. All questions were answered to their satisfaction.  TIMA-NURSE  I, TIMA-NURSE, have reviewed all documentation for this visit. The documentation on 10/29/22 for the exam, diagnosis, procedures, and orders are all accurate and complete.   IF YOU HAVE BEEN REFERRED TO A SPECIALIST, IT MAY TAKE 1-2 WEEKS TO SCHEDULE/PROCESS THE REFERRAL. IF YOU HAVE NOT HEARD FROM US/SPECIALIST IN TWO WEEKS, PLEASE GIVE [place] A CALL AT [phone].   THE PATIENT IS ENCOURAGED TO PRACTICE SOCIAL DISTANCING DUE TO THE COVID-19 PANDEMIC.

## 2022-10-30 LAB — AFP TUMOR MARKER: AFP, Serum, Tumor Marker: 2.7 ng/mL (ref 0.0–9.2)

## 2022-11-03 ENCOUNTER — Telehealth: Payer: Self-pay | Admitting: Internal Medicine

## 2022-11-03 NOTE — Telephone Encounter (Signed)
Pt was called to follow up about obtaining sleep study referral. Pt scheduled for an appointment on Sept 18th at 11:40. Pt aware.

## 2022-11-04 ENCOUNTER — Ambulatory Visit (INDEPENDENT_AMBULATORY_CARE_PROVIDER_SITE_OTHER): Payer: Medicare HMO | Admitting: Psychiatry

## 2022-11-04 DIAGNOSIS — R69 Illness, unspecified: Secondary | ICD-10-CM | POA: Diagnosis not present

## 2022-11-04 DIAGNOSIS — F3340 Major depressive disorder, recurrent, in remission, unspecified: Secondary | ICD-10-CM | POA: Diagnosis not present

## 2022-11-04 DIAGNOSIS — F4323 Adjustment disorder with mixed anxiety and depressed mood: Secondary | ICD-10-CM | POA: Diagnosis not present

## 2022-11-04 NOTE — Progress Notes (Signed)
Psychotherapy Progress Note [facility] [name], PhD LP  [name] [facility] "[name]")    MRN: [id number] Therapy format: Individual psychotherapy Date: 11/04/2022      Start: 9:20a     Stop: 10:07a     Time Spent: 47 min Location: In-person   Session narrative (presenting needs, interim history, self-report of stressors and symptoms, applications of prior therapy, status changes, and interventions made in session) Feeling some better.  Been going to a [employer], not fully comfortable there, so started checking out local options, tried out [employer], the church of a Zenaida Niece mate.  Found it diverse, more relaxed about asking for money, more obviously involved helping the community, seemingly better organized, and a female pastor who spoke on accountability.  Figures to go back, see more.  Knows she needed somewhere more contemporary.    In her home environment, has started clearing refrigerator of outdated and spoiled food.  Wants to start digging out a corner of living room to get rid of some things, hopefully yard sale or [employer].  Still hopes to move along about April.  Planning some cooking. Looking to get out walking in the mornings now that it's cooled.    Socially, generally off Facebook now, in order to prevent dating and dating issues.  Had a few words with Chandra Batch, challenging some loose things he said, implying that she would be available and he would take up with her or visit somehow.  She does seem clear by now, without distress, that that will not happen, and that she has the right to call it when he is just speaking lines.  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  Affirmed and encouraged in all pursuits.  Therapeutic modalities: Cognitive Behavioral Therapy, Solution-Oriented/Positive Psychology, Ego-Supportive, and  Faith-sensitive  Mental Status/Observations:  Appearance:   Casual and Neat     Behavior:  Appropriate  Motor:  Normal and with rolling walker  Speech/Language:   Clear and Coherent  Affect:  Appropriate  Mood:  normal  Thought process:  normal  Thought content:    WNL  Sensory/Perceptual disturbances:    WNL  Orientation:  Fully oriented  Attention:  Good    Concentration:  Good  Memory:  WNL  Insight:    Good  Judgment:   Good  Impulse Control:  Good   Risk Assessment: Danger to Self: No Self-injurious Behavior: No Danger to Others: No Physical Aggression / Violence: No Duty to Warn: No Access to Firearms a concern: No  Assessment of progress:  progressing well  Diagnosis:   ICD-10-CM   1. MDD (recurrent major depressive disorder) in remission (HCC)  F33.40     2. Adjustment disorder with mixed anxiety and depressed mood  F43.23     3. Multiple comorbid health conditions  R69      Plan:  Living situation -- Endorse researching more reliable, better suited living space as noted.  As needed, research tenant's rights in the event of current landlady defaulting her mortgage default.  Try to make a practice of cleaning up in smaller, more frequent efforts and/or bringing in help as needed, with the mindset that it's a gift she gives to "the lady who lives here". Socialization and relationships -- Continue involvement in church and suitable groups as able.  Develop local friendships and "bonus" mother-daughter relationship at interest.  Offer to support  groups such as [employer] or Al-Anon, and active elder facilities like [employer], [employer].  Endorse option to work part-time if truly able and interested.  Endorse choice over dating, so long as she can keep her integrity, maintain the view that she is worth working for, and does not have to make any desperate decisions to feel loved and lovable, and lonely does not have to be desperate.   Self-esteem -- Per her  faith, continue to practice self-esteem grounded in God's unconditional love.  Self-affirm that whatever she might deem "stupid", she has either learned, improved, or was doing the best she could figure out at the time, while trying to deal with strong and natural feelings, e.g., loneliness.  When tempted to feel like a failure for lack of family, or for acting quickly on desires, reframe self-image as a survivor who had to take on the harshest of losses and is legitimately tempted. General health -- Endorse any healthy activity, to include pool/gym if able and interested.  Address diabetes and renal self-care promptly and reliably.  Recommend taming light in late evening and after bed, with options to use soothing sound and orange lenses. Family -- Endorse boundary/limits with [name].  Endorse "bonus" mother-daughter relationship. Other recommendations/advice -- As may be noted above.  Continue to utilize previously learned skills ad lib. Medication compliance -- Maintain medication as prescribed and work faithfully with relevant prescriber(s) if any changes are desired or seem indicated. Crisis service -- Aware of call list and work-in appts.  Call the clinic on-call service, 988/hotline, 911, or present to [facility] or ER if any life-threatening psychiatric crisis. Followup -- Return for time as already scheduled.  Next scheduled visit with me 11/18/2022.  Next scheduled in this office 11/18/2022.  Robley Fries, PhD [name], PhD LP Clinical Psychologist, [facility] [address] [place], [place] [address] [phone]

## 2022-11-06 ENCOUNTER — Ambulatory Visit: Payer: Self-pay

## 2022-11-06 NOTE — Patient Instructions (Signed)
Visit Information  Thank you for taking time to visit with me today. Please don't hesitate to contact me if I can be of assistance to you.   Following are the goals we discussed today:   Goals Addressed             This Visit's Progress    To get evaluated for sleep apnea   On track    Care Coordination Interventions: Evaluation of current treatment plan related to sleep apnea and patient's adherence to plan as established by provider Determined patient is concerned she may have obstructive sleep apnea due to awakening multiple times throughout night and sometimes she feels she is gasping for air  Reviewed and discussed her upcoming scheduled appointment with PCP provider [name] NP on 11/12/22 @11 :40 AM for evaluation of sleep apnea      To improve kidney function       Care Coordination Interventions: Assessed the Patient understanding of chronic kidney disease    Evaluation of current treatment plan related to chronic kidney disease self management and patient's adherence to plan as established by provider      Reviewed prescribed diet increase water to 48-64 oz daily unless otherwise directed Discussed complications of poorly controlled blood pressure such as heart disease, stroke, circulatory complications, vision complications, kidney impairment, sexual dysfunction    Provided education on kidney disease progression    Engage patient in early, proactive and ongoing discussion about goals of care and what matters most to them    Last practice recorded BP readings:  BP Readings from Last 3 Encounters:  10/07/22 122/74  07/28/22 122/84  07/16/22 112/67   Most recent eGFR/CrCl:  Lab Results  Component Value Date   EGFR 39 (L) 10/07/2022    No components found for: "CRCL"        Our next appointment is by telephone on 12/04/22 at 11:00 AM  Please call the care guide team at [phone] if you need to cancel or reschedule your appointment.   If you are experiencing a  Mental Health or Behavioral Health Crisis or need someone to talk to, please call 1-800-273-TALK (toll free, 24 hour hotline)  Patient verbalizes understanding of instructions and care plan provided today and agrees to view in MyChart. Active MyChart status and patient understanding of how to access instructions and care plan via MyChart confirmed with patient.     Delsa Sale RN BSN CCM [place]  [facility], [facility] Nurse Care Coordinator  Direct Dial: [phone] Website: [email or web address]

## 2022-11-06 NOTE — Patient Outreach (Signed)
  Care Coordination   Follow Up Visit Note   11/06/2022 Name: Teresa Golden MRN: [id number] DOB: 1945/05/30  Teresa Golden is a 77 y.o. year old female who sees Dorothyann Peng, MD for primary care. I spoke with  Margrett Rud by phone today.  What matters to the patients health and wellness today?  Patient is concerned about her kidney function due to recent decline. Patient would like to be evaluated for sleep apnea.     Goals Addressed             This Visit's Progress    To get evaluated for sleep apnea   On track    Care Coordination Interventions: Evaluation of current treatment plan related to sleep apnea and patient's adherence to plan as established by provider Determined patient is concerned she may have obstructive sleep apnea due to awakening multiple times throughout night and sometimes she feels she is gasping for air  Reviewed and discussed her upcoming scheduled appointment with PCP provider [name] NP on 11/12/22 @11 :40 AM for evaluation of sleep apnea      To improve kidney function       Care Coordination Interventions: Assessed the Patient understanding of chronic kidney disease    Evaluation of current treatment plan related to chronic kidney disease self management and patient's adherence to plan as established by provider      Reviewed prescribed diet increase water to 48-64 oz daily unless otherwise directed Discussed complications of poorly controlled blood pressure such as heart disease, stroke, circulatory complications, vision complications, kidney impairment, sexual dysfunction    Provided education on kidney disease progression    Engage patient in early, proactive and ongoing discussion about goals of care and what matters most to them    Last practice recorded BP readings:  BP Readings from Last 3 Encounters:  10/07/22 122/74  07/28/22 122/84  07/16/22 112/67   Most recent eGFR/CrCl:  Lab Results  Component Value Date   EGFR 39 (L) 10/07/2022     No components found for: "CRCL"        SDOH assessments and interventions completed:  No     Care Coordination Interventions:  Yes, provided   Follow up plan: Follow up call scheduled for 12/04/22 @11 :00 AM    Encounter Outcome:  Patient Visit Completed

## 2022-11-12 ENCOUNTER — Ambulatory Visit: Payer: Medicare HMO | Admitting: Family Medicine

## 2022-11-18 ENCOUNTER — Telehealth: Payer: Self-pay | Admitting: Internal Medicine

## 2022-11-18 ENCOUNTER — Ambulatory Visit (INDEPENDENT_AMBULATORY_CARE_PROVIDER_SITE_OTHER): Payer: Medicare HMO | Admitting: Psychiatry

## 2022-11-18 DIAGNOSIS — R69 Illness, unspecified: Secondary | ICD-10-CM

## 2022-11-18 DIAGNOSIS — F3340 Major depressive disorder, recurrent, in remission, unspecified: Secondary | ICD-10-CM

## 2022-11-18 DIAGNOSIS — F4323 Adjustment disorder with mixed anxiety and depressed mood: Secondary | ICD-10-CM

## 2022-11-18 DIAGNOSIS — Z599 Problem related to housing and economic circumstances, unspecified: Secondary | ICD-10-CM | POA: Diagnosis not present

## 2022-11-18 NOTE — Telephone Encounter (Signed)
PICK UP 10/29/22 INSULIN DEGLUDEC 5X3ML QUANTITY 1

## 2022-11-18 NOTE — Progress Notes (Signed)
Psychotherapy Progress Note [facility] [name], PhD LP  [name] [facility] "[name]")    MRN: [id number] Therapy format: Individual psychotherapy Date: 11/18/2022      Start: 8:14a     Stop: 9:01a     Time Spent: 47 min Location: In-person   Session narrative (presenting needs, interim history, self-report of stressors and symptoms, applications of prior therapy, status changes, and interventions made in session) "I think I'm actually happy" since visiting [facility] and befriending another [employer] rider, Larene Beach and some others through [employer].  Also has a good lead on another apartment come April.  At home, has recovered fully from her down time, when she was putting off picking up, even bathing.  One bother that her landlord texted Sunday asking to pick up the check, and has been doing that each month, trying to get paid 5 and 6 days before due.  Discussed possibility of just letting her know she is waiting for her own primary income to clear and can't oblige.  Better than trying to invent things  Chandra Batch still calls, but not taking his line of b.s.  Has prayed about her happiness and received word she doesn't have to have a romance.  Still, she can be moved by indications that he decides to come out as interested in her, for example, audibly telling a family member he's on the phone with his "friend [name]".  Still a long way from ending a dating relationship where he lives.  Encouraged her to stay firm about respect and not consenting to be a secret.  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  Officially rated partially disabled for each of 3 conditions officially approved for the combination, plus age.  City pension soon followed, and she has a yearly reporting requirement for outside income.  Has worked [employer] for [place] [employer].  Was very sick one year after  an abdominal skin removal operation, and only that once late on rent, otherwise clean credit history.    Still plans to work early voting, training coming up soon.  After her pineapple upside down cake was a hit at church, she's thinking about doing some side baking for pay, as well, maybe take a decorating course to beef up her skills.  Affirmed and encouraged in work of choice and exploring.  Therapeutic modalities: Cognitive Behavioral Therapy, Solution-Oriented/Positive Psychology, Environmental manager, and Faith-sensitive  Mental Status/Observations:  Appearance:   Casual     Behavior:  Appropriate  Motor:  Normal and exc Rollator  Speech/Language:   Clear and Coherent  Affect:  Appropriate  Mood:  normal  Thought process:  normal  Thought content:    WNL  Sensory/Perceptual disturbances:    WNL  Orientation:  Fully oriented  Attention:  Good    Concentration:  Good  Memory:  WNL  Insight:    Good  Judgment:   Good  Impulse Control:  Variable   Risk Assessment: Danger to Self: No Self-injurious Behavior: No Danger to Others: No Physical Aggression / Violence: No Duty to Warn: No Access to Firearms a concern: No  Assessment of progress:  progressing well  Diagnosis:   ICD-10-CM   1. MDD (recurrent major depressive disorder) in remission (HCC)  F33.40     2. Adjustment disorder with mixed anxiety and depressed mood  F43.23     3. Multiple  comorbid health conditions  R69     4. Housing or economic circumstances  Z59.9      Plan:  Living situation -- Endorse researching more reliable, better suited living space as noted, with April target to move.  As needed, research tenant's rights in the event landlady defaults on the mortgage.  For housekeeping, try to make a practice of smaller, more frequent efforts and/or bringing in help as needed, with the mindset that it's all a "gift to the lady who lives here". Socialization and relationships -- Continue involvement in church and  suitable groups as able.  Develop local friendships and "bonus" mother-daughter relationship at interest.  Offer to support groups such as CoDA or Al-Anon, and active elder facilities like [employer], [employer].  Endorse option to work part-time if truly able and interested.  Endorse choice over dating, so long as she can keep her integrity, maintain the view that she is worth working for and being honest for, and does not have to make any desperate decisions to feel loved and lovable.  Lonely does not have to be desperate.   Self-esteem -- Per her faith, continue to practice self-esteem grounded in God's unconditional love.  Self-affirm that whatever she might deem "stupid", she has either learned, improved, or was doing the best she could figure out at the time, while trying to deal with strong and natural feelings, e.g., loneliness.  When tempted to feel like a failure for lack of family, or for acting quickly on desires, reframe self-image as a survivor who had to take on the harshest of losses and is legitimately tempted. General health -- Endorse any healthy activity, to include pool/gym if able and interested.  Address diabetes and renal self-care promptly and reliably.  Recommend taming light in late evening and after bed, with options to use soothing sound and orange lenses. Family -- Endorse boundary/limits with [name].  Endorse "bonus" mother-daughter relationship. Other recommendations/advice -- As may be noted above.  Continue to utilize previously learned skills ad lib. Medication compliance -- Maintain medication as prescribed and work faithfully with relevant prescriber(s) if any changes are desired or seem indicated. Crisis service -- Aware of call list and work-in appts.  Call the clinic on-call service, 988/hotline, 911, or present to [facility] or ER if any life-threatening psychiatric crisis. Followup -- No follow-ups on file.  Next scheduled visit with me 12/02/2022.  Next  scheduled in this office 12/02/2022.  Robley Fries, PhD [name], PhD LP Clinical Psychologist, [facility] [address] [place], [place] [address] [phone]

## 2022-11-26 ENCOUNTER — Ambulatory Visit (INDEPENDENT_AMBULATORY_CARE_PROVIDER_SITE_OTHER): Payer: Medicare HMO | Admitting: Family Medicine

## 2022-11-26 ENCOUNTER — Encounter: Payer: Self-pay | Admitting: Family Medicine

## 2022-11-26 ENCOUNTER — Ambulatory Visit: Payer: Medicare HMO | Admitting: Family Medicine

## 2022-11-26 ENCOUNTER — Telehealth: Payer: Self-pay

## 2022-11-26 VITALS — BP 110/70 | HR 73 | Temp 98.1°F | Ht 65.0 in | Wt 202.0 lb

## 2022-11-26 DIAGNOSIS — Z23 Encounter for immunization: Secondary | ICD-10-CM | POA: Diagnosis not present

## 2022-11-26 DIAGNOSIS — R0683 Snoring: Secondary | ICD-10-CM | POA: Diagnosis not present

## 2022-11-26 DIAGNOSIS — Z6833 Body mass index (BMI) 33.0-33.9, adult: Secondary | ICD-10-CM

## 2022-11-26 DIAGNOSIS — E66811 Obesity, class 1: Secondary | ICD-10-CM

## 2022-11-26 DIAGNOSIS — R899 Unspecified abnormal finding in specimens from other organs, systems and tissues: Secondary | ICD-10-CM | POA: Diagnosis not present

## 2022-11-26 DIAGNOSIS — G4700 Insomnia, unspecified: Secondary | ICD-10-CM

## 2022-11-26 DIAGNOSIS — E6609 Other obesity due to excess calories: Secondary | ICD-10-CM | POA: Diagnosis not present

## 2022-11-26 NOTE — Progress Notes (Signed)
I,Jameka J Llittleton, CMA,acting as a Neurosurgeon for [employer], NP.,have documented all relevant documentation on the behalf of [name], NP,as directed by  [name], NP while in the presence of [name], NP.  Subjective:  [name] , female    DOB: 11-12-1945 , 77 y.o.   MRN: [id number]  Chief Complaint  Patient presents with   Insomnia    HPI  Patient presents today for insomnia. She finds it difficult to fall asleep at night and sometimes she states she wakes up from sleep feeling like she is trying to catch her breathe. Patient states that she snores when she sleeps , she does not know if she needs a CPAP machine.She reported she feels stressed regarding her housing situation.      Past Medical History:  Diagnosis Date   Arthritis    Chronic kidney disease    self reports ckd stage 3    Depression    Diabetes mellitus, type II, insulin dependent (HCC)    With neurologic complications. 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    Hypertensive nephropathy 07/05/2018   Hypothyroidism    Hypothyroidism 02/06/2018   Morbid obesity with BMI of 50.0-59.9, adult (HCC)    Scoliosis      Family History  Problem Relation Age of Onset   Heart attack Mother    Heart failure Mother    Stroke Father      Current Outpatient Medications:    allopurinol (ZYLOPRIM) 100 MG tablet, Take 1 tablet (100 mg total) by mouth daily., Disp: 90 tablet, Rfl: 2   amLODipine (NORVASC) 5 MG tablet, Take 1 tablet by mouth once daily, Disp: 90 tablet, Rfl: 0   AZO-CRANBERRY PO, Take 1 tablet by mouth daily., Disp: , Rfl:    B Complex-C (B-COMPLEX WITH VITAMIN C) tablet, Take 1 tablet by mouth 3 (three) times a week., Disp: , Rfl:     betamethasone dipropionate 0.05 % cream, Apply topically 2 (two) times daily., Disp: 30 g, Rfl: 0   Biotin 46962 MCG TABS, Take 10,000 mcg by mouth daily., Disp: , Rfl:    cetirizine (ZYRTEC) 10 MG tablet, Take 1 tablet (10 mg total) by mouth daily., Disp: 30 tablet, Rfl: 1   Cholecalciferol 5000 units TABS, Take 5,000 Units by mouth daily., Disp: , Rfl:    colchicine 0.6 MG tablet, Take 1 tablet by mouth for gout attack., Disp: 30 tablet, Rfl: 1   dapagliflozin propanediol (FARXIGA) 10 MG TABS tablet, Take 1 tablet (10 mg total) by mouth daily., Disp: 90 tablet, Rfl: 3   diclofenac Sodium (VOLTAREN) 1 % GEL, APPLY 2 GRAMS TO AFFECTED AREA(S) 4 TIMES DAILY AS NEEDED (Patient taking differently: Apply 2-4 g topically every 6 (six) hours as needed (to affected areas).), Disp: 200 g, Rfl: 2   gabapentin (NEURONTIN) 300 MG capsule, Take 2 capsules (600 mg total) by mouth at bedtime., Disp: 60 capsule, Rfl: 2   glucose blood (ONETOUCH VERIO) test strip, USE TO CHECK BLOOD SUGAR   TWO TIMES A DAY AS         INSTRUCTED, Disp: 200 strip, Rfl: 3   Insulin Degludec (TRESIBA) 100 UNIT/ML SOLN, Inject 10 Units/oz/day into the  skin at bedtime. Patient is taking at bedtime, Disp: , Rfl:    Lancets (ONETOUCH DELICA PLUS LANCET33G) MISC, USE TO CHECK BLOOD SUGARS  TWO TIMES A DAY AS         INSTRUCTED, Disp: 200 each, Rfl: 3   levothyroxine (SYNTHROID) 25 MCG tablet, Take 1/2 tablet daily., Disp: 90 tablet, Rfl: 1   losartan (COZAAR) 100 MG tablet, Take 1 tablet (100 mg total) by mouth daily., Disp: 90 tablet, Rfl: 2   metoprolol succinate (TOPROL-XL) 25 MG 24 hr tablet, TAKE 1 TABLET AT BEDTIME   (DISCONTINUE COREG), Disp: 90 tablet, Rfl: 1   Multiple Vitamins-Minerals (MULTIVITAMIN WITH MINERALS) tablet, Take 1 tablet by mouth daily. Women's 50+, Disp: , Rfl:    pantoprazole (PROTONIX) 40 MG tablet, Take 40 mg by mouth daily as needed., Disp: , Rfl:    Semaglutide, 1 MG/DOSE, (OZEMPIC, 1 MG/DOSE,) 4 MG/3ML SOPN,  Inject 1 mg into the skin once a week., Disp: 9 mL, Rfl: 1   simvastatin (ZOCOR) 10 MG tablet, Take 1 tablet (10 mg total) by mouth daily., Disp: 90 tablet, Rfl: 0   Allergies  Allergen Reactions   Meloxicam Other (See Comments)    Fever; muscle aches; "flu-like" symptoms "Allergic," per MAR   Other Other (See Comments)    "Rose fever and hay fever"     Review of Systems  Constitutional: Negative.   HENT: Negative.    Eyes: Negative.   Cardiovascular:  Negative for chest pain, palpitations and leg swelling.  Endocrine: Negative for polydipsia, polyphagia and polyuria.  Neurological: Negative.   Psychiatric/Behavioral:  Positive for sleep disturbance. The patient has insomnia.      Today's Vitals   11/26/22 1417  BP: 110/70  Pulse: 73  Temp: 98.1 F (36.7 C)  Weight: 202 lb (91.6 kg)  Height: 5\' 5"  (1.651 m)   Body mass index is 33.61 kg/m.  Wt Readings from Last 3 Encounters:  11/26/22 202 lb (91.6 kg)  10/29/22 199 lb (90.3 kg)  10/07/22 199 lb 6.4 oz (90.4 kg)    The 10-year ASCVD risk score (Arnett DK, et al., 2019) is: 29.5%   Values used to calculate the score:     Age: 54 years     Sex: Female     Is Non-Hispanic African American: Yes     Diabetic: Yes     Tobacco smoker: No     Systolic Blood Pressure: 110 mmHg     Is BP treated: Yes     HDL Cholesterol: 72 mg/dL     Total Cholesterol: 154 mg/dL  Objective:  Physical Exam HENT:     Head: Normocephalic.  Cardiovascular:     Rate and Rhythm: Normal rate and regular rhythm.  Abdominal:     General: Bowel sounds are normal.  Musculoskeletal:     Cervical back: Normal range of motion.  Skin:    General: Skin is warm and dry.  Neurological:     General: No focal deficit present.     Mental Status: She is alert and oriented to person, place, and time.         Assessment And Plan:  Loud snoring -     Ambulatory referral to Sleep Studies  Immunization due -     Flu Vaccine Trivalent High Dose  (Fluad)  Abnormal laboratory test Assessment & Plan: Recheck labs  Orders: -     BMP8+eGFR -     CBC  Class 1 obesity due to excess  calories with serious comorbidity and body mass index (BMI) of 33.0 to 33.9 in adult    Return for keep scheduled appt.  Patient was given opportunity to ask questions. Patient verbalized understanding of the plan and was able to repeat key elements of the plan. All questions were answered to their satisfaction.   I, [name], NP, have reviewed all documentation for this visit. The documentation on 11/29/22 for the exam, diagnosis, procedures, and orders are all accurate and complete.     IF YOU HAVE BEEN REFERRED TO A SPECIALIST, IT MAY TAKE 1-2 WEEKS TO SCHEDULE/PROCESS THE REFERRAL. IF YOU HAVE NOT HEARD FROM US/SPECIALIST IN TWO WEEKS, PLEASE GIVE [place] A CALL AT [phone].

## 2022-11-26 NOTE — Telephone Encounter (Signed)
Patient picked up her ozempic 1mg  today she had 4 boxes, she picked up one box of tresiba 100 units, and she picked up 4 boxes of her novofine needles. YL,RMA

## 2022-11-27 LAB — BMP8+EGFR
BUN/Creatinine Ratio: 16 (ref 12–28)
BUN: 20 mg/dL (ref 8–27)
CO2: 23 mmol/L (ref 20–29)
Calcium: 9.1 mg/dL (ref 8.7–10.3)
Chloride: 105 mmol/L (ref 96–106)
Creatinine, Ser: 1.25 mg/dL — ABNORMAL HIGH (ref 0.57–1.00)
Glucose: 95 mg/dL (ref 70–99)
Potassium: 4.3 mmol/L (ref 3.5–5.2)
Sodium: 142 mmol/L (ref 134–144)
eGFR: 44 mL/min/{1.73_m2} — ABNORMAL LOW (ref >59)

## 2022-11-27 LAB — CBC
Hematocrit: 37.9 % (ref 34.0–46.6)
Hemoglobin: 12.4 g/dL (ref 11.1–15.9)
MCH: 26.9 pg (ref 26.6–33.0)
MCHC: 32.7 g/dL (ref 31.5–35.7)
MCV: 82 fL (ref 79–97)
Platelets: 224 10*3/uL (ref 150–450)
RBC: 4.61 x10E6/uL (ref 3.77–5.28)
RDW: 15.4 % (ref 11.7–15.4)
WBC: 8.5 10*3/uL (ref 3.4–10.8)

## 2022-11-29 ENCOUNTER — Other Ambulatory Visit: Payer: Self-pay | Admitting: Internal Medicine

## 2022-12-02 ENCOUNTER — Ambulatory Visit: Payer: Medicare HMO | Admitting: Psychiatry

## 2022-12-02 DIAGNOSIS — F3340 Major depressive disorder, recurrent, in remission, unspecified: Secondary | ICD-10-CM | POA: Diagnosis not present

## 2022-12-02 DIAGNOSIS — Z599 Problem related to housing and economic circumstances, unspecified: Secondary | ICD-10-CM | POA: Diagnosis not present

## 2022-12-02 DIAGNOSIS — R69 Illness, unspecified: Secondary | ICD-10-CM

## 2022-12-02 DIAGNOSIS — F4323 Adjustment disorder with mixed anxiety and depressed mood: Secondary | ICD-10-CM

## 2022-12-02 DIAGNOSIS — R0683 Snoring: Secondary | ICD-10-CM | POA: Insufficient documentation

## 2022-12-02 DIAGNOSIS — R899 Unspecified abnormal finding in specimens from other organs, systems and tissues: Secondary | ICD-10-CM | POA: Insufficient documentation

## 2022-12-02 NOTE — Assessment & Plan Note (Signed)
Recheck labs 

## 2022-12-02 NOTE — Progress Notes (Signed)
 Psychotherapy Progress Note [facility] [name], PhD LP  [name] [facility] "Teresa Golden")    MRN: [id number] Therapy format: Individual psychotherapy Date: 12/02/2022      Start: 9:12a     Stop: 10:00a     Time Spent: 48 min Location: In-person   Session narrative (presenting needs, interim history, self-report of stressors and symptoms, applications of prior therapy, status changes, and interventions made in session) Has scheduled a telehealth appt 10/22, phone only d/t past experience with video not working correctly.  Briefly practiced video connection in session to break down bias and preserve the video option.    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  Dealing with arthritis flareup, responsive to Voltaren.  Church still working out well, after overcoming apprehensions.  Looking forward to [employer], and gathering inspirational messages.  Still working on figuring out her next apartment, trying to reconcile location, amenities and price.  Considering repainting some furniture and decluttering as she gets closer to plans.  Looking into sleep apnea, will be assessing home vs lab test.  Waking up repeatedly in the night, sometimes catching her breath.  Discussed points of anxiety including female relationship, clarified values.  Therapeutic modalities: Cognitive Behavioral Therapy, Solution-Oriented/Positive Psychology, Environmental manager, and Faith-sensitive  Mental Status/Observations:  Appearance:   Casual     Behavior:  Appropriate  Motor:  Rollator  Speech/Language:   Clear and Coherent  Affect:  Appropriate  Mood:  normal  Thought process:  circumstantial  Thought content:    WNL  Sensory/Perceptual disturbances:    WNL  Orientation:  Fully oriented  Attention:  Good    Concentration:  Fair  Memory:  WNL  Insight:    Good  Judgment:   Good  Impulse  Control:  Good   Risk Assessment: Danger to Self: No Self-injurious Behavior: No Danger to Others: No Physical Aggression / Violence: No Duty to Warn: No Access to Firearms a concern: No  Assessment of progress:  progressing  Diagnosis:   ICD-10-CM   1. MDD (recurrent major depressive disorder) in remission (HCC)  F33.40     2. Adjustment disorder with mixed anxiety and depressed mood  F43.23     3. Multiple comorbid health conditions  R69     4. Housing or economic circumstances  Z59.9      Plan:  Living situation -- Endorse researching more reliable, better suited living space as noted, with April target to move.  As needed, research tenant's rights in the event landlady defaults on the mortgage.  For housekeeping, try to make a practice of smaller, more frequent efforts and/or bringing in help as needed, with the mindset that it's all a "gift to the lady who lives here". Socialization and relationships -- Continue involvement in church and suitable groups as able.  Develop local friendships and "bonus" mother-daughter relationship at interest.  Offer to support groups such as CoDA or Al-Anon, and active elder facilities like [employer], [employer].  Endorse option to work part-time if truly able and interested.  Endorse choice over dating, so long as she can keep her integrity, maintain the view that she is worth working for and being honest for, and does not have to make any desperate decisions to feel loved and lovable.  Lonely does not have to be desperate.   Self-esteem -- Per her faith, continue to  practice self-esteem grounded in [employer] love.  Self-affirm that whatever she might deem "stupid", she has either learned, improved, or was doing the best she could figure out at the time, while trying to deal with strong and natural feelings, e.g., loneliness.  When tempted to feel like a failure for lack of family, or for acting quickly on desires, reframe  self-image as a survivor who had to take on the harshest of losses and is legitimately tempted. General health -- Endorse any healthy activity, to include pool/gym if able and interested.  Address diabetes and renal self-care promptly and reliably.  Recommend taming light in late evening and after bed, with options to use soothing sound and orange lenses. Family -- Endorse boundary/limits with [name].  Endorse "bonus" mother-daughter relationship. Other recommendations/advice -- As may be noted above.  Continue to utilize previously learned skills ad lib. Medication compliance -- Maintain medication as prescribed and work faithfully with relevant prescriber(s) if any changes are desired or seem indicated. Crisis service -- Aware of call list and work-in appts.  Call the clinic on-call service, 988/hotline, 911, or present to [facility] or ER if any life-threatening psychiatric crisis. Followup -- Return for time as already scheduled.  Next scheduled visit with me 12/16/2022.  Next scheduled in this office 12/16/2022.  Robley Fries, PhD [name], PhD LP Clinical Psychologist, [facility] [address], [address] [place], [place] [address] [phone]

## 2022-12-08 NOTE — Progress Notes (Signed)
errone

## 2022-12-16 ENCOUNTER — Ambulatory Visit: Payer: Medicare HMO | Admitting: Psychiatry

## 2022-12-19 ENCOUNTER — Ambulatory Visit: Payer: Self-pay

## 2022-12-19 NOTE — Patient Instructions (Signed)
Visit Information  Thank you for taking time to visit with me today. Please don't hesitate to contact me if I can be of assistance to you.   Following are the goals we discussed today:   Goals Addressed               This Visit's Progress     Patient Stated     I want to lower my A1c to 6.0 (pt-stated)   On track     Care Coordination Interventions: Evaluation of current treatment plan related to type 2 Diabetes and patient's adherence to plan as established by provider Provided patient with written educational materials related to hypo and hyperglycemia and importance of correct treatment Advised patient, providing education and rationale, to check cbg daily before meals and record, calling PCP for findings outside established parameters Review of patient status, including review of consultants reports, relevant laboratory and other test results, and medications completed Counseled on Diabetic diet, my plate method, portion control  Mailed printed list of compliance packaging pharmacies for patient review and consideration  Reviewed and discussed with patient her next scheduled PCP follow up for dm check scheduled for 01/08/23 @2 :40 PM Lab Results  Component Value Date   HGBA1C 6.6 (H) 10/07/2022        Other     To get evaluated for sleep apnea   On track     Care Coordination Interventions: Evaluation of current treatment plan related to sleep apnea and patient's adherence to plan as established by provider Reviewed and discussed with patient her new patient appointment with Dr. Vickey Huger scheduled for 01/01/23 @09 :00 AM, for evaluation of sleep apnea          Our next appointment is by telephone on 01/14/23 at 11:30 AM  Please call the care guide team at [phone] if you need to cancel or reschedule your appointment.   If you are experiencing a Mental Health or Behavioral Health Crisis or need someone to talk to, please call [phone]-TALK (toll free, 24 hour  hotline)  Patient verbalizes understanding of instructions and care plan provided today and agrees to view in MyChart. Active MyChart status and patient understanding of how to access instructions and care plan via MyChart confirmed with patient.     Delsa Sale RN BSN CCM [place]  [facility], [facility] Nurse Care Coordinator  Direct Dial: [phone] Website: [email or web address]

## 2022-12-19 NOTE — Patient Outreach (Signed)
Care Coordination   Follow Up Visit Note   12/19/2022 Name: Teresa Golden MRN: [id number] DOB: 1945/07/15  Teresa Golden is a 77 y.o. year old female who sees Teresa Peng, MD for primary care. I spoke with  Teresa Golden by phone today.  What matters to the patients health and wellness today?  Patient would like to lower her A1c <6.6 % by next dm check up.     Goals Addressed               This Visit's Progress     Patient Stated     I want to lower my A1c to 6.0 (pt-stated)   On track     Care Coordination Interventions: Evaluation of current treatment plan related to type 2 Diabetes and patient's adherence to plan as established by provider Provided patient with written educational materials related to hypo and hyperglycemia and importance of correct treatment Advised patient, providing education and rationale, to check cbg daily before meals and record, calling PCP for findings outside established parameters Review of patient status, including review of consultants reports, relevant laboratory and other test results, and medications completed Counseled on Diabetic diet, my plate method, portion control  Mailed printed list of compliance packaging pharmacies for patient review and consideration  Reviewed and discussed with patient her next scheduled PCP follow up for dm check scheduled for 01/08/23 @2 :40 PM Lab Results  Component Value Date   HGBA1C 6.6 (H) 10/07/2022       Other     To get evaluated for sleep apnea   On track     Care Coordination Interventions: Evaluation of current treatment plan related to sleep apnea and patient's adherence to plan as established by provider Reviewed and discussed with patient her new patient appointment with Dr. Vickey Huger scheduled for 01/01/23 @09 :00 AM, for evaluation of sleep apnea      Interventions Today    Flowsheet Row Most Recent Value  Chronic Disease   Chronic disease during today's visit Diabetes, Other  [sleep  apnea]  General Interventions   General Interventions Discussed/Reviewed General Interventions Discussed, General Interventions Reviewed, Doctor Visits, Durable Medical Equipment (DME)  Doctor Visits Discussed/Reviewed Doctor Visits Discussed, Doctor Visits Reviewed, Specialist, PCP  Durable Medical Equipment (DME) Glucomoter  Education Interventions   Education Provided Provided Education  Provided Verbal Education On Medication, Blood Sugar Monitoring, When to see the doctor, Nutrition  Nutrition Interventions   Nutrition Discussed/Reviewed Nutrition Discussed, Nutrition Reviewed, Portion sizes, Fluid intake  Pharmacy Interventions   Pharmacy Dicussed/Reviewed Pharmacy Topics Reviewed, Pharmacy Topics Discussed, Medication Adherence          SDOH assessments and interventions completed:  No     Care Coordination Interventions:  Yes, provided   Follow up plan: Follow up call scheduled for 01/14/23 @11 :30 AM    Encounter Outcome:  Patient Visit Completed

## 2022-12-23 ENCOUNTER — Ambulatory Visit (INDEPENDENT_AMBULATORY_CARE_PROVIDER_SITE_OTHER): Payer: Medicare HMO | Admitting: Psychiatry

## 2022-12-23 ENCOUNTER — Other Ambulatory Visit (INDEPENDENT_AMBULATORY_CARE_PROVIDER_SITE_OTHER): Payer: Medicare HMO | Admitting: Pharmacist

## 2022-12-23 DIAGNOSIS — Z630 Problems in relationship with spouse or partner: Secondary | ICD-10-CM | POA: Diagnosis not present

## 2022-12-23 DIAGNOSIS — F3340 Major depressive disorder, recurrent, in remission, unspecified: Secondary | ICD-10-CM

## 2022-12-23 DIAGNOSIS — R69 Illness, unspecified: Secondary | ICD-10-CM | POA: Diagnosis not present

## 2022-12-23 DIAGNOSIS — N1831 Chronic kidney disease, stage 3a: Secondary | ICD-10-CM

## 2022-12-23 DIAGNOSIS — Z794 Long term (current) use of insulin: Secondary | ICD-10-CM

## 2022-12-23 DIAGNOSIS — E1122 Type 2 diabetes mellitus with diabetic chronic kidney disease: Secondary | ICD-10-CM

## 2022-12-23 DIAGNOSIS — F4323 Adjustment disorder with mixed anxiety and depressed mood: Secondary | ICD-10-CM | POA: Diagnosis not present

## 2022-12-23 DIAGNOSIS — Z599 Problem related to housing and economic circumstances, unspecified: Secondary | ICD-10-CM

## 2022-12-23 NOTE — Progress Notes (Signed)
Received notification that patient has been conditionally approved for [place] assistance through [employer]. Will complete and print patient portion of application for her to sign at Dr. Allyne Gee' appointment on 11/14. Will forward to CPhT team for follow up.   Patient notes she may be needing to move. Will ensure updated address is given to [place] and [place] for shipping when needed.   Patient amenable to me completing Novo Nordisk reapplication online for 2025 for her for Ozempic 1 mg, Tresiba 10 units daily, and pen needles. Will forward to CPhT team for follow up.  Janeice Robinson, PharmD, BCACP, CPP Clinical Pharmacist [facility]  [phone]

## 2022-12-23 NOTE — Progress Notes (Signed)
 Psychotherapy Progress Note [facility] [name], PhD LP  [name] [facility] "[name]")    MRN: [id number] Therapy format: Individual psychotherapy Date: 12/23/2022      Start: 9:13a     Stop: 10:00a     Time Spent: 47 min Location: Telehealth visit -- I connected with this patient by an approved telecommunication method (audio only), with her informed consent, and verifying identity and patient privacy.  I was located at my office and patient at her home.  As needed, we discussed the limitations, risks, and security and privacy concerns associated with telehealth service, including the availability and conditions which currently govern in-person appointments and the possibility that 3rd-party payment may not be fully guaranteed and she may be responsible for charges.  After she indicated understanding, we proceeded with the session.  Also discussed treatment planning, as needed, including ongoing verbal agreement with the plan, the opportunity to ask and answer all questions, her demonstrated understanding of instructions, and her readiness to call the office should symptoms worsen or she feels she is in a crisis state and needs more immediate and tangible assistance.   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  Has been fulfilling overall, albeit working through a moment with her supervisor (working [employer]) where she felt shamed.  Realized she was letting thoughts build up in her head and gave her some benefit of the doubt, going better.  Finding some people tend to think her mobility issue means she might be slower in the head, asserted herself.  Bringing a positive approach to the environment overall, and finds she can help it stay relaxed and civil.   Getting along well with coworkers, and no signs of high tension or impending conflict at the polls.  Socializing well with new friends Marcelino Duster and [place].  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  A flooring issue in her apartment, not a safety issue exactly, but enough to pursue with landlady.  Looking ahead to the move, still anticipated maybe April.  Feeling some apprehension about affording it.  Is accepted already as a Psychologist, educational.  Thinking about finding some kind of work from home to help make ends meet more comfortably.  Chandra Batch still calls, it turns out, a couple times a week.  She is keeping her expectations low, though she does also call actively.  Addressed thought experiments for how she might do, either not calling for a week (would feel somewhat offended), or if his GF Delores puts her foot down and he wants to make contact more of a secret (OK, it's been that way already), or he decides instead to marry Delores (can deal with that), or he decides to get off the fence and pursue [name] (unlikely -- he is tied to land near [place] and to his church position).  Doesn't feel there is much of anything he could do that would shock her or put her off balance, unless maybe Delores decided to up and confront her.  Realizes she is drawn to his masculinity, i.e, the quality of sex.  Allowed that she can choose these things at this stage of her life, just want to guard against setting  up false expectations and painful social or emotional consequences.  Confesses she wonders sometimes if she's healthy enough, overall, psychologically.  No time to get far into it, but supported in her discernment as she goes.  Therapeutic modalities: Cognitive Behavioral Therapy, Solution-Oriented/Positive Psychology, and Ego-Supportive  Mental Status/Observations:  Appearance:   Not assessed     Behavior:   Appropriate  Motor:  Not assessed  Speech/Language:   Clear and Coherent  Affect:  Not assessed  Mood:  normal  Thought process:  normal  Thought content:    WNL  Sensory/Perceptual disturbances:    WNL  Orientation:  Fully oriented  Attention:  Good    Concentration:  Fair  Memory:  WNL  Insight:    Variable  Judgment:   Good  Impulse Control:  Good   Risk Assessment: Danger to Self: No Self-injurious Behavior: No Danger to Others: No Physical Aggression / Violence: No Duty to Warn: No Access to Firearms a concern: No  Assessment of progress:  stabilized  Diagnosis:   ICD-10-CM   1. MDD (recurrent major depressive disorder) in remission (HCC)  F33.40     2. Adjustment disorder with mixed anxiety and depressed mood  F43.23     3. Multiple comorbid health conditions  R69     4. Relationship problem between partners  Z63.0     5. Housing or economic circumstances  Z59.9      Plan:  Living situation -- Endorse researching more reliable, better suited living space as noted, with April target to move.  As needed, research tenant's rights in the event landlady defaults on the mortgage.  For housekeeping, try to make a practice of smaller, more frequent efforts and/or bringing in help as needed, with the mindset that it's all a "gift to the lady who lives here". Socialization and relationships -- Continue involvement in church and suitable groups as able.  Develop local friendships and "bonus" mother-daughter relationship at interest.  Offer to support groups such as Co[employer] or Al-Anon, and active elder facilities like [employer], [employer].  Endorse option to work part-time if truly able and interested.  Endorse choice over dating, so long as she can keep her integrity, maintain the view that she is worth working for and being honest for, and does not have to make any desperate decisions to feel loved and lovable.  Lonely does not have to be desperate.   Self-esteem --  Per her faith, continue to practice self-esteem grounded in God's unconditional love.  Self-affirm that whatever she might deem "stupid", she has either learned, improved, or was doing the best she could figure out at the time, while trying to deal with strong and natural feelings, e.g., loneliness.  When tempted to feel like a failure for lack of family, or for acting quickly on desires, reframe self-image as a survivor who had to take on the harshest of losses and is legitimately tempted. General health -- Endorse any healthy activity, to include pool/gym if able and interested.  Address diabetes and renal self-care promptly and reliably.  Recommend taming light in late evening and after bed, with options to use soothing sound and orange lenses. Family -- Endorse boundary/limits with [name].  Endorse "bonus" mother-daughter relationship. Other recommendations/advice -- As may be noted above.  Continue to utilize previously learned skills ad lib. Medication compliance -- Maintain medication as prescribed and work faithfully with relevant prescriber(s) if any changes are desired or seem indicated. Crisis service -- Aware of call  list and work-in appts.  Call the clinic on-call service, 988/hotline, 911, or present to [facility] or ER if any life-threatening psychiatric crisis. Followup -- Return for time as already scheduled.  Next scheduled visit with me 01/02/2023.  Next scheduled in this office 01/02/2023.  Robley Fries, PhD [name], PhD LP Clinical Psychologist, [facility] [address] [place], [place] [address] [phone]

## 2022-12-31 ENCOUNTER — Telehealth: Payer: Self-pay

## 2022-12-31 NOTE — Telephone Encounter (Signed)
Attempted to call patient but voicemail full. Will contact patient via MyChart.   Roslyn Smiling, PharmD PGY1 Pharmacy Resident 12/31/2022 2:11 PM

## 2023-01-01 ENCOUNTER — Encounter: Payer: Self-pay | Admitting: Neurology

## 2023-01-01 ENCOUNTER — Ambulatory Visit: Payer: Medicare HMO | Admitting: Neurology

## 2023-01-01 VITALS — BP 158/61 | HR 55 | Ht 65.0 in | Wt 205.8 lb

## 2023-01-01 DIAGNOSIS — K219 Gastro-esophageal reflux disease without esophagitis: Secondary | ICD-10-CM | POA: Insufficient documentation

## 2023-01-01 DIAGNOSIS — R0683 Snoring: Secondary | ICD-10-CM | POA: Diagnosis not present

## 2023-01-01 DIAGNOSIS — E66811 Obesity, class 1: Secondary | ICD-10-CM | POA: Diagnosis not present

## 2023-01-01 DIAGNOSIS — R0681 Apnea, not elsewhere classified: Secondary | ICD-10-CM

## 2023-01-01 DIAGNOSIS — Z6833 Body mass index (BMI) 33.0-33.9, adult: Secondary | ICD-10-CM

## 2023-01-01 DIAGNOSIS — R0689 Other abnormalities of breathing: Secondary | ICD-10-CM | POA: Diagnosis not present

## 2023-01-01 DIAGNOSIS — E6609 Other obesity due to excess calories: Secondary | ICD-10-CM

## 2023-01-01 NOTE — Patient Instructions (Signed)
Quality Sleep Information, Adult Quality sleep is important for your mental and physical health. It also improves your quality of life. Quality sleep means you: Are asleep for most of the time you are in bed. Fall asleep within 30 minutes. Wake up no more than once a night. Are awake for no longer than 20 minutes if you do wake up during the night. Most adults need 7-8 hours of quality sleep each night. How can poor sleep affect me? If you do not get enough quality sleep, you may have: Mood swings. Daytime sleepiness. Decreased alertness, reaction time, and concentration. Sleep disorders, such as insomnia and sleep apnea. Difficulty with: Solving problems. Coping with stress. Paying attention. These issues may affect your performance and productivity at work, school, and home. Lack of sleep may also put you at higher risk for accidents, suicide, and risky behaviors. If you do not get quality sleep, you may also be at higher risk for several health problems, including: Infections. Type 2 diabetes. Heart disease. High blood pressure. Obesity. Worsening of long-term conditions, like arthritis, kidney disease, depression, Parkinson's disease, and epilepsy. What actions can I take to get more quality sleep? Sleep schedule and routine Stick to a sleep schedule. Go to sleep and wake up at about the same time each day. Do not try to sleep less on weekdays and make up for lost sleep on weekends. This does not work. Limit naps during the day to 30 minutes or less. Do not take naps in the late afternoon. Make time to relax before bed. Reading, listening to music, or taking a hot bath promotes quality sleep. Make your bedroom a place that promotes quality sleep. Keep your bedroom dark, quiet, and at a comfortable room temperature. Make sure your bed is comfortable. Avoid using electronic devices that give off bright blue light for 30 minutes before bedtime. Your brain perceives bright blue light  as sunlight. This includes television, phones, and computers. If you are lying awake in bed for longer than 20 minutes, get up and do a relaxing activity until you feel sleepy. Lifestyle     Try to get at least 30 minutes of exercise on most days. Do not exercise 2-3 hours before going to bed. Do not use any products that contain nicotine or tobacco. These products include cigarettes, chewing tobacco, and vaping devices, such as e-cigarettes. If you need help quitting, ask your health care provider. Do not drink caffeinated beverages for at least 8 hours before going to bed. Coffee, tea, and some sodas contain caffeine. Do not drink alcohol or eat large meals close to bedtime. Try to get at least 30 minutes of sunlight every day. Morning sunlight is best. Medical concerns Work with your health care provider to treat medical conditions that may affect sleeping, such as: Nasal obstruction. Snoring. Sleep apnea and other sleep disorders. Talk to your health care provider if you think any of your prescription medicines may cause you to have difficulty falling or staying asleep. If you have sleep problems, talk with a sleep consultant. If you think you have a sleep disorder, talk with your health care provider about getting evaluated by a specialist. Where to find more information Sleep Foundation: sleepfoundation.org American Academy of Sleep Medicine: aasm.org Centers for Disease Control and Prevention (CDC): cdc.gov Contact a health care provider if: You have trouble getting to sleep or staying asleep. You often wake up very early in the morning and cannot get back to sleep. You have daytime sleepiness. You   have daytime sleep attacks of suddenly falling asleep and sudden muscle weakness (narcolepsy). You have a tingling sensation in your legs with a strong urge to move your legs (restless legs syndrome). You stop breathing briefly during sleep (sleep apnea). You think you have a sleep  disorder or are taking a medicine that is affecting your quality of sleep. Summary Most adults need 7-8 hours of quality sleep each night. Getting enough quality sleep is important for your mental and physical health. Make your bedroom a place that promotes quality sleep, and avoid things that may cause you to have poor sleep, such as alcohol, caffeine, smoking, or large meals. Talk to your health care provider if you have trouble falling asleep or staying asleep. This information is not intended to replace advice given to you by your health care provider. Make sure you discuss any questions you have with your health care provider. Document Revised: 06/05/2021 Document Reviewed: 06/05/2021 Elsevier Patient Education  2024 [employer].  

## 2023-01-01 NOTE — Progress Notes (Signed)
SLEEP MEDICINE CLINIC    Provider:  Melvyn Novas, MD  Primary Care Physician:  Dorothyann Peng, MD [address] [place] [address]     Referring Provider:   [facility]: [name], Np [address] [place],  [place] [address]          Chief Complaint according to patient   Patient presents with:     New Sleep Patient (Initial Visit)           HISTORY OF PRESENT ILLNESS:  Teresa Golden is a 77 y.o. female patient who is seen upon referral on 01/01/2023 from Dr Zella Ball office  for a Sleep consult.  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 The patient never had a sleep study.   Sleep relevant medical history: SOB which has much improved since weight loss, history of grief related major depression, DM2, GERD, CKD 3, Nocturia 1-2 , 3 AM , Tonsillectomy at age [age],  wisdom teeth extracted, has plates on top. Bridge in lower.   2019 - lost 103 pounds after gastric sleeve  surgery.  Family medical /sleep history: No other family member on CPAP with OSA, insomnia, sleep walkers. Mother had an unspecified mental illness.    Social history: Ms. Crooke graduated [employer] at age 8, went to [employer] , [place], [place]. Graduated with honors. The Patient is working as an Land , she is otherwise fully retired from counseling and lives in a household alone. Family status is divorced, her daughter deceased- died  in 26, her only child.   Tobacco use; none .  ETOH use ;  wine:  2/ week,  Caffeine intake in form of Coffee( 1 cup / d ) Soda( /) Tea ( /) nor energy drinks Exercise in form of walking, uses a walker. [employer], [employer] .   Loves to cook.     Sleep habits are as follows: The patient's dinner time is between 2-3 PM. The patient goes to bed at 8 PM and continues to  sleep until 4 AM.   The preferred sleep position is lateral , with the support of 1-2 pillows for GERD.  Dreams are reportedly rare..  The patient wakes up spontaneously 4  AM is the usual rise time.  She reports not feeling refreshed or restored in AM, with symptoms such as dry mouth-, no morning headaches, achy and sore at times- and residual fatigue.  Naps are taken infrequently, only lately- lasting from 15 to 45 minutes and are refreshing.    Review of Systems: Out of a complete 14 system review, the patient complains of only the following symptoms, and all other reviewed systems are negative.:  Fatigue, sleepiness , snoring, fragmented sleep, Insomnia, RLS, Nocturia    How likely are you to doze in the following situations: 0 = not likely, 1 = slight chance, 2 = moderate chance, 3 = high chance   Sitting and Reading? Watching Television? Sitting inactive in a public place (theater or meeting)? As a passenger in a car for an hour without a break? Lying down in the afternoon when circumstances permit? Sitting and talking to someone? Sitting quietly after lunch without alcohol? In a car, while stopped for a few minutes in traffic?   Total = 3/ 24 points  FSS endorsed at 17/ 63 points.   GDS 6/ 15 points.   Social History   Socioeconomic History   Marital status: Single    Spouse name: Not on file   Number of children: Not on file   Years of education: Not on file   Highest education level: Not on file  Occupational History   Occupation: retired  Tobacco Use   Smoking status: Former    Current packs/day: 0.25    Average packs/day: 0.3 packs/day for 10.0 years (2.5 ttl pk-yrs)    Types: Cigarettes   Smokeless tobacco: Never   Tobacco comments:    quit 20 years ago  Vaping Use   Vaping status: Never Used  Substance and Sexual Activity   Alcohol use: Not Currently    Alcohol/week: 1.0 standard drink of alcohol    Types: 1 Glasses of wine per week    Comment:  occasional   Drug use: No   Sexual activity: Yes  Other Topics Concern   Not on file  Social History Narrative   Not on file   Social Determinants of Health   Financial Resource Strain: Low Risk  (10/22/2022)   Overall Financial Resource Strain (CARDIA)    Difficulty of Paying Living Expenses: Not hard at all  Food Insecurity: No Food Insecurity (10/22/2022)   Hunger Vital Sign    Worried About Running Out of Food in the Last Year: Never true    Ran Out of Food in the Last Year: Never true  Transportation Needs: No Transportation Needs (10/22/2022)   PRAPARE - Administrator, Civil Service (Medical): No    Lack of Transportation (Non-Medical): No  Physical Activity: Inactive (10/22/2022)   Exercise Vital Sign    Days of Exercise per Week: 0 days    Minutes of Exercise per Session: 0 min  Stress: Stress Concern Present (10/22/2022)   [employer] of Occupational Health - Occupational Stress Questionnaire    Feeling of Stress : To some extent  Social Connections: Moderately Isolated (10/22/2022)   Social Connection and Isolation Panel [NHANES]    Frequency of Communication with Friends and Family: More than three times a week    Frequency of Social Gatherings with Friends and Family: Never    Attends Religious Services: More than 4 times per year    Active Member of [employer] or Organizations: No    Attends Banker Meetings: Never    Marital Status: Widowed    Family History  Problem Relation Age of Onset   Heart attack Mother    Heart failure Mother    Stroke Father     Past Medical History:  Diagnosis Date   Arthritis    Chronic kidney disease    self reports ckd stage 3    Depression    Diabetes mellitus, type II, insulin dependent (HCC)    With neurologic complications. 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 remission since taking Harvoni    Hypertensive nephropathy 07/05/2018   Hypothyroidism    Hypothyroidism 02/06/2018   Morbid obesity with BMI of 50.0-59.9, adult (HCC)    Scoliosis     Past Surgical History:  Procedure Laterality Date   ABDOMINAL HYSTERECTOMY     uterus  CHOLECYSTECTOMY     DIAGNOSTIC LAPAROSCOPY     DILATION AND CURETTAGE OF UTERUS     EYE SURGERY     cataract extraction bilateral    INCISION AND DRAINAGE OF WOUND N/A 10/15/2020   Procedure: IRRIGATION AND DEBRIDEMENT WOUND;  Surgeon: Peggye Form, DO;  Location: [facility] OR;  Service: Plastics;  Laterality: N/A;  60 min   JOINT REPLACEMENT     Left knee   KNEE ARTHROPLASTY Right 07/30/2017   Procedure: RIGHT TOTAL KNEE ARTHROPLASTY WITH COMPUTER NAVIGATION;  Surgeon: Samson Frederic, MD;  Location: [facility] ORS;  Service: Orthopedics;  Laterality: Right;  Needs RNFA   LAPAROSCOPIC GASTRIC BANDING     and reversal   LAPAROSCOPIC GASTRIC SLEEVE RESECTION N/A 08/26/2016   Procedure: LAPAROSCOPIC GASTRIC SLEEVE RESECTION WITH UPPER ENOD;  Surgeon: Luretha Murphy, MD;  Location: [facility] ORS;  Service: General;  Laterality: N/A;   PANNICULECTOMY N/A 09/26/2020   Procedure: PANNICULECTOMY;  Surgeon: Peggye Form, DO;  Location: [facility] OR;  Service: Plastics;  Laterality: N/A;   TONSILLECTOMY     TRANSTHORACIC ECHOCARDIOGRAM  11/2013   EF 65-70%. Normal diastolic Fxn.  Normal Valves.   UMBILICAL HERNIA REPAIR N/A 09/26/2020   Procedure: HERNIA REPAIR UMBILICAL ADULT;  Surgeon: Violeta Gelinas, MD;  Location: [facility] OR;  Service: General;  Laterality: N/A;     Current Outpatient Medications on File Prior to Visit  Medication Sig Dispense Refill   allopurinol (ZYLOPRIM) 100 MG tablet Take 1 tablet (100 mg total) by mouth daily. 90 tablet 2   amLODipine (NORVASC) 5 MG tablet Take 1 tablet by mouth once daily 90 tablet 0   AZO-CRANBERRY PO Take 1  tablet by mouth daily.     B Complex-C (B-COMPLEX WITH VITAMIN C) tablet Take 1 tablet by mouth 3 (three) times a week.     betamethasone dipropionate 0.05 % cream Apply topically 2 (two) times daily. 30 g 0   Biotin 16109 MCG TABS Take 10,000 mcg by mouth daily.     cetirizine (ZYRTEC) 10 MG tablet Take 1 tablet (10 mg total) by mouth daily. 30 tablet 1   Cholecalciferol 5000 units TABS Take 5,000 Units by mouth daily.     colchicine 0.6 MG tablet Take 1 tablet by mouth for gout attack. 30 tablet 1   dapagliflozin propanediol (FARXIGA) 10 MG TABS tablet Take 1 tablet (10 mg total) by mouth daily. 90 tablet 3   diclofenac Sodium (VOLTAREN) 1 % GEL APPLY 2 GRAMS TO AFFECTED AREA(S) 4 TIMES DAILY AS NEEDED (Patient taking differently: Apply 2-4 g topically every 6 (six) hours as needed (to affected areas).) 200 g 2   gabapentin (NEURONTIN) 300 MG capsule Take 2 capsules (600 mg total) by mouth at bedtime. 60 capsule 2   glucose blood (ONETOUCH VERIO) test strip USE TO CHECK BLOOD SUGAR   TWO TIMES A DAY AS         INSTRUCTED 200 strip 3   Insulin Degludec (TRESIBA) 100 UNIT/ML SOLN Inject 10 Units/oz/day into the skin at bedtime. Patient is taking at bedtime     Lancets (ONETOUCH DELICA PLUS LANCET33G) MISC USE TO CHECK BLOOD SUGARS  TWO TIMES A DAY AS         INSTRUCTED 200 each 3   levothyroxine (SYNTHROID) 25 MCG tablet Take 1/2 tablet daily. 90 tablet 1   losartan (COZAAR) 100 MG tablet Take 1 tablet (100 mg total) by mouth daily. 90 tablet 2   metoprolol succinate (TOPROL-XL) 25 MG  24 hr tablet TAKE 1 TABLET AT BEDTIME   (DISCONTINUE COREG) 90 tablet 1   Multiple Vitamins-Minerals (MULTIVITAMIN WITH MINERALS) tablet Take 1 tablet by mouth daily. Women's 50+     pantoprazole (PROTONIX) 40 MG tablet Take 40 mg by mouth daily as needed.     Semaglutide, 1 MG/DOSE, (OZEMPIC, 1 MG/DOSE,) 4 MG/3ML SOPN Inject 1 mg into the skin once a week. 9 mL 1   simvastatin (ZOCOR) 10 MG tablet Take 1 tablet (10  mg total) by mouth daily. 90 tablet 0   triamcinolone ointment (KENALOG) 0.1 % Apply 1 Application topically 2 (two) times daily.     [DISCONTINUED] SEMGLEE, YFGN, 100 UNIT/ML Pen Inject 9 Units into the skin at bedtime.     No current facility-administered medications on file prior to visit.    Allergies  Allergen Reactions   Meloxicam Other (See Comments)    Fever; muscle aches; "flu-like" symptoms "Allergic," per MAR   Other Other (See Comments)    "Rose fever and hay fever"     DIAGNOSTIC DATA (LABS, IMAGING, TESTING) - I reviewed patient records, labs, notes, testing and imaging myself where available.  Lab Results  Component Value Date   WBC 8.5 11/26/2022   HGB 12.4 11/26/2022   HCT 37.9 11/26/2022   MCV 82 11/26/2022   PLT 224 11/26/2022      Component Value Date/Time   NA 142 11/26/2022 1459   K 4.3 11/26/2022 1459   CL 105 11/26/2022 1459   CO2 23 11/26/2022 1459   GLUCOSE 95 11/26/2022 1459   GLUCOSE 167 (H) 09/26/2021 1356   BUN 20 11/26/2022 1459   CREATININE 1.25 (H) 11/26/2022 1459   CREATININE 1.01 (H) 09/26/2021 1356   CALCIUM 9.1 11/26/2022 1459   PROT 7.2 06/23/2022 1545   ALBUMIN 4.2 06/23/2022 1545   AST 23 06/23/2022 1545   AST 21 09/26/2021 1356   ALT 17 06/23/2022 1545   ALT 15 09/26/2021 1356   ALKPHOS 95 06/23/2022 1545   BILITOT 0.4 06/23/2022 1545   BILITOT 0.3 09/26/2021 1356   GFRNONAA 57 (L) 09/26/2021 1356   GFRAA 67 11/29/2019 1411   Lab Results  Component Value Date   CHOL 154 06/23/2022   HDL 72 06/23/2022   LDLCALC 72 06/23/2022   TRIG 45 06/23/2022   CHOLHDL 2.1 06/23/2022   Lab Results  Component Value Date   HGBA1C 6.6 (H) 10/07/2022   Lab Results  Component Value Date   VITAMINB12 >2000 (H) 06/23/2022   Lab Results  Component Value Date   TSH 2.450 04/30/2022    PHYSICAL EXAM:  Today's Vitals   01/01/23 0826  BP: (!) 158/61  Pulse: (!) 55  Weight: 205 lb 12.8 oz (93.4 kg)  Height: 5\' 5"  (1.651 m)    Body mass index is 34.25 kg/m.   Wt Readings from Last 3 Encounters:  01/01/23 205 lb 12.8 oz (93.4 kg)  11/26/22 202 lb (91.6 kg)  10/29/22 199 lb (90.3 kg)     Ht Readings from Last 3 Encounters:  01/01/23 5\' 5"  (1.651 m)  11/26/22 5\' 5"  (1.651 m)  10/29/22 5\' 5"  (1.651 m)      General: The patient is awake, alert and appears not in acute distress. The patient is well groomed. Head: Normocephalic, atraumatic. Neck is supple. Mallampati 3,  neck circumference:15 inches . Nasal airflow is patent.  Overbite is seen.  Retrognathia is  seen.  Dental status: some biological  Cardiovascular:  Regular rate  and cardiac rhythm by pulse,  without distended neck veins. Respiratory: Lungs are clear to auscultation.  Skin:  Without evidence of ankle edema, or rash. Trunk: The patient's posture is erect.   NEUROLOGIC EXAM: The patient is awake and alert, oriented to place and time.   Memory subjective described as intact.  Attention span & concentration ability appears normal.  Speech is fluent,  without  dysarthria, dysphonia or aphasia.  Mood and affect are appropriate.   Cranial nerves: no loss of smell or taste reported  Pupils are equal and briskly reactive to light. Funduscopic exam def.  Extraocular movements in vertical and horizontal planes were intact and without nystagmus. No Diplopia. Visual fields by finger perimetry are intact. Hearing was intact to soft voice and finger rubbing.    Facial sensation intact to fine touch.  Facial motor strength is symmetric and tongue and uvula move midline.  Neck ROM : rotation, tilt and flexion extension were normal for age and shoulder shrug was symmetrical.    Motor exam:  Symmetric bulk, tone and ROM.   Normal tone without cog wheeling, symmetric grip strength .   Sensory:  Fine touch and vibration were not felt at the knees and below - severe(!) Proprioception tested in the upper extremities was normal.   Coordination: Rapid  alternating movements in the fingers/hands were of normal speed.  The Finger-to-nose maneuver was intact without evidence of ataxia, dysmetria or tremor.   Gait and station: Patient could rise unassisted from a seated position, but walked with a walker for assistance.  Stance made scoliosis notable.  Deep tendon reflexes: in the upper and lower extremities are symmetric and intact.  Babinski response was deferred.    ASSESSMENT AND PLAN 77 y.o. year old female  here with:  Patient presents today for sleep continuity insomnia. She has an easy time going to bed but she wakes up. She has  reduced the intake of alcohol,  as it made her snore.  Shewakes up from sleep feeling like she is trying to catch her breathe. Patient states that she snores when she sleeps , she does not know if she needs a CPAP machine.She reported she feels stressed regarding her housing situation.       1) possible snoring( she lives alone) she sometimes wakes up choking.   2) Her weight loss has likely reduced her chance of sleep apnea from high risk to moderate risk at  BMI 34.  There is significant overbite, retrognathia and and high grade Mallampati.   3) co-morbidities may explain her fatigue, there is chronic aching, right leg DVT, scoliosis, DM2, and mild CKD.   HST was ordered. Sleep hygiene discussed.    I plan to follow up either personally or through our NP within 3-5 months.   I would like to thank Dorothyann Peng, MD and [name], Np [address] [place],  [place] [address] for allowing me to meet with and to take care of this pleasant patient.      After spending a total time of  45  minutes face to face and additional time for physical and neurologic examination, review of laboratory studies,  personal review of imaging studies, reports and results of other testing and review of referral information / records as far as provided in visit,   Electronically signed by: Melvyn Novas, MD 01/01/2023 9:00 AM  [facility] Board certified by The [employer] of Sleep Medicine and Diplomate of  the American Academy of Sleep Medicine. Board certified In Neurology through the ABPN, Fellow of the [employer] of Neurology.

## 2023-01-02 ENCOUNTER — Ambulatory Visit (INDEPENDENT_AMBULATORY_CARE_PROVIDER_SITE_OTHER): Payer: Medicare HMO | Admitting: Psychiatry

## 2023-01-02 DIAGNOSIS — F3341 Major depressive disorder, recurrent, in partial remission: Secondary | ICD-10-CM

## 2023-01-02 DIAGNOSIS — R69 Illness, unspecified: Secondary | ICD-10-CM | POA: Diagnosis not present

## 2023-01-02 DIAGNOSIS — Z599 Problem related to housing and economic circumstances, unspecified: Secondary | ICD-10-CM

## 2023-01-02 DIAGNOSIS — F4323 Adjustment disorder with mixed anxiety and depressed mood: Secondary | ICD-10-CM

## 2023-01-02 DIAGNOSIS — Z630 Problems in relationship with spouse or partner: Secondary | ICD-10-CM | POA: Diagnosis not present

## 2023-01-02 NOTE — Progress Notes (Signed)
 Psychotherapy Progress Note [facility] [name], PhD LP  [name] [facility] "[name]")    MRN: [id number] Therapy format: Individual psychotherapy Date: 01/02/2023      Start: 8:16a     Stop: 9:05a     Time Spent: 49 min Location: Telehealth visit -- I connected with this patient by an approved telecommunication method (audio only), with her informed consent, and verifying identity and patient privacy.  I was located at my office and patient at her home.  As needed, we discussed the limitations, risks, and security and privacy concerns associated with telehealth service, including the availability and conditions which currently govern in-person appointments and the possibility that 3rd-party payment may not be fully guaranteed and she may be responsible for charges.  After she indicated understanding, we proceeded with the session.  Also discussed treatment planning, as needed, including ongoing verbal agreement with the plan, the opportunity to ask and answer all questions, her demonstrated understanding of instructions, and her readiness to call the office should symptoms worsen or she feels she is in a crisis state and needs more immediate and tangible assistance.   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  Also finding she has better stamina than she thought, since being busier working.  Recognizes her depression tends to manifest in lethargy, so she wants to make sure she's willing to get some exercise.  Looking out for what she might do for her next working gig.  Morale still good, glad to find what she still has in her at this stage.    Wants to take a closer look at her interpersonal skills after today's meeting.  Says she harbors some sensitivities about aging.   Suggested she probably comes across as much more able and approachable than she feels.  Insight that watching her disabled mother, decades ago, taught her that concern, but accepts that she outshines appearances, and some things have changed since the 50s and 60s.  Still alert to how she may get written off as a Black woman, which is valid.  Has picked up the story of pre- and post-election harassment of Blacks, dismayed but not shocked.  Relying on faith in God, pretty philosophical at this point.    Has gone for sleep apnea consultation, judged unlikely to need CPAP but will get a home study to be sure.  Considering the aging process, has a cautionary memory of going to rehab after a surgery and seeing mishandling of wound vac and other items.  Has a bit of insurance for personal care.  Recommended Senior Resources for more education on what she can do and help working through any needs for documents and planning.  Therapeutic modalities: Cognitive Behavioral Therapy, Solution-Oriented/Positive Psychology, Environmental manager, and Faith-sensitive  Mental Status/Observations:  Appearance:   Not assessed     Behavior:  Appropriate  Motor:  Not assessed  Speech/Language:   Clear and Coherent  Affect:  Not assessed  Mood:  normal  Thought process:  normal  Thought content:    WNL  Sensory/Perceptual disturbances:    WNL  Orientation:  Fully oriented  Attention:  Good    Concentration:  Fair  Memory:  WNL  Insight:    Good  Judgment:   Good  Impulse Control:  Good   Risk Assessment: Danger to Self: No  Self-injurious Behavior: No Danger to Others: No Physical Aggression / Violence: No Duty to Warn: No Access to Firearms a concern: No  Assessment of progress:  progressing  Diagnosis:   ICD-10-CM   1. Major depressive disorder, recurrent, in partial remission (HCC)  F33.41     2. Adjustment disorder with mixed anxiety and depressed mood  F43.23     3. Multiple comorbid health conditions   R69     4. Relationship problem between partners  Z63.0     5. Housing or economic circumstances  Z59.9     6. r/o sleep apnea  R69      Plan:  Aging resources -- refer to [employer] for more education Living situation -- Endorse researching more reliable, better suited living space as noted, with April target to move.  As needed, research tenant's rights in the event landlady defaults on the mortgage.  For housekeeping, try to make a practice of smaller, more frequent efforts and/or bringing in help as needed, with the mindset that it's all a "gift to the lady who lives here". Socialization and relationships -- Continue involvement in church and suitable groups as able.  Develop local friendships and "bonus" mother-daughter relationship at interest.  Offer to support groups such as CoDA or Al-Anon, and active elder facilities like [employer], [employer].  Endorse option to work part-time if truly able and interested.  Endorse choice over dating, so long as she can keep her integrity, maintain the view that she is worth working for and being honest for, and does not have to make any desperate decisions to feel loved and lovable.  Lonely does not have to be desperate.   Self-esteem -- Per her faith, continue to practice self-esteem grounded in God's unconditional love.  Self-affirm that whatever she might deem "stupid", she has either learned, improved, or was doing the best she could figure out at the time, while trying to deal with strong and natural feelings, e.g., loneliness.  When tempted to feel like a failure for lack of family, or for acting quickly on desires, reframe self-image as a survivor who had to take on the harshest of losses and is legitimately tempted. General health -- Endorse any healthy activity, to include pool/gym if able and interested.  Address diabetes and renal self-care promptly and reliably.  Recommend taming light in late evening and after bed, with  options to use soothing sound and orange lenses. Family -- Endorse boundary/limits with [name].  Endorse "bonus" mother-daughter relationship. Other recommendations/advice -- As may be noted above.  Continue to utilize previously learned skills ad lib. Medication compliance -- Maintain medication as prescribed and work faithfully with relevant prescriber(s) if any changes are desired or seem indicated. Crisis service -- Aware of call list and work-in appts.  Call the clinic on-call service, 988/hotline, 911, or present to [facility] or ER if any life-threatening psychiatric crisis. Followup -- Return for time as already scheduled.  Next scheduled visit with me 01/13/2023.  Next scheduled in this office 01/13/2023.  Robley Fries, PhD [name], PhD LP Clinical Psychologist, [facility] [address] [place], [place] [address] [phone]

## 2023-01-08 ENCOUNTER — Ambulatory Visit: Payer: Medicare HMO | Admitting: Internal Medicine

## 2023-01-12 ENCOUNTER — Ambulatory Visit (INDEPENDENT_AMBULATORY_CARE_PROVIDER_SITE_OTHER): Payer: Medicare HMO | Admitting: Podiatry

## 2023-01-12 ENCOUNTER — Encounter: Payer: Self-pay | Admitting: Podiatry

## 2023-01-12 DIAGNOSIS — M79675 Pain in left toe(s): Secondary | ICD-10-CM | POA: Diagnosis not present

## 2023-01-12 DIAGNOSIS — E1142 Type 2 diabetes mellitus with diabetic polyneuropathy: Secondary | ICD-10-CM

## 2023-01-12 DIAGNOSIS — M79674 Pain in right toe(s): Secondary | ICD-10-CM | POA: Diagnosis not present

## 2023-01-12 DIAGNOSIS — B351 Tinea unguium: Secondary | ICD-10-CM | POA: Diagnosis not present

## 2023-01-12 MED ORDER — GABAPENTIN 300 MG PO CAPS: 600.0000 mg | ORAL_CAPSULE | Freq: Every day | ORAL | 2 refills | Status: DC

## 2023-01-12 NOTE — Progress Notes (Signed)
  Subjective:  [name], female    DOB: Nov 18, 1945,   MRN: [id number]  Chief Complaint  Patient presents with   Nail Problem    [facility]    77 y.o. female presents for concern of thickened elongated and painful nails that are difficult to trim. Requesting to have them trimmed today. Relates burning and tingling in their feet. Patient is diabetic and last A1c was  Lab Results  Component Value Date   HGBA1C 6.6 (H) 10/07/2022   .  Also relates third digit and second digit hammertoes. Relates the third toe is painful and was to have tenotomy preformed.   PCP:  Dorothyann Peng, MD    . Denies any other pedal complaints. Denies n/v/f/c.   Past Medical History:  Diagnosis Date   Arthritis    Chronic kidney disease    self reports ckd stage 3    Depression    Diabetes mellitus, type II, insulin dependent (HCC)    With neurologic complications. 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    Hypertensive nephropathy 07/05/2018   Hypothyroidism    Hypothyroidism 02/06/2018   Morbid obesity with BMI of 50.0-59.9, adult (HCC)    Scoliosis     Objective:  Physical Exam: Vascular: DP/PT pulses 2/4 bilateral. CFT <3 seconds. Absent hair growth on digits. Edema noted to bilateral lower extremities. Xerosis noted bilaterally.  Skin. No lacerations or abrasions bilateral feet. Nails 1-5 bilateral  are thickened discolored and elongated with subungual debris. Hyperkeratotic tissue noted to distal left third digit.  Musculoskeletal: MMT 5/5 bilateral lower extremities in DF, PF, Inversion and Eversion. Deceased ROM in DF of ankle joint. HAV deformity noted bilateral moderate on the left with crossover second toe and hammered third digit.   Neurological: Sensation intact to light touch. Protective sensation diminished bilateral.     Assessment:   1. Pain due to onychomycosis of toenails of both feet   2. Polyneuropathy due to type 2 diabetes mellitus (HCC)      Plan:  Patient was evaluated and treated and all questions answered. -Discussed and educated patient on diabetic foot care, especially with  regards to the vascular, neurological and musculoskeletal systems.  -Stressed the importance of good glycemic control and the detriment of not  controlling glucose levels in relation to the foot. -Discussed supportive shoes at all times and checking feet regularly.  -Mechanically debrided all nails 1-5 bilateral using sterile nail nipper and filed with dremel without incident  -Answered all patient questions -Hyperkeratotic tissue debrided without incident.  -Did discussed possible tenotomy for left third toe and callus present at next appointment.  -Gabapentin refilled.  -Patient to return  in 3 months for at risk foot care -Patient advised to call the office if any problems or questions arise in the meantime.   Louann Sjogren, DPM

## 2023-01-13 ENCOUNTER — Emergency Department (HOSPITAL_COMMUNITY)
Admission: EM | Admit: 2023-01-13 | Discharge: 2023-01-13 | Disposition: A | Discharge status: Home / Self Care | Payer: Medicare HMO | Attending: Emergency Medicine | Admitting: Emergency Medicine

## 2023-01-13 ENCOUNTER — Emergency Department (HOSPITAL_COMMUNITY): Payer: Medicare HMO

## 2023-01-13 ENCOUNTER — Other Ambulatory Visit: Payer: Self-pay

## 2023-01-13 ENCOUNTER — Ambulatory Visit: Payer: Medicare HMO | Admitting: Psychiatry

## 2023-01-13 DIAGNOSIS — Z794 Long term (current) use of insulin: Secondary | ICD-10-CM | POA: Insufficient documentation

## 2023-01-13 DIAGNOSIS — F4323 Adjustment disorder with mixed anxiety and depressed mood: Secondary | ICD-10-CM | POA: Diagnosis not present

## 2023-01-13 DIAGNOSIS — R69 Illness, unspecified: Secondary | ICD-10-CM | POA: Diagnosis not present

## 2023-01-13 DIAGNOSIS — M5441 Lumbago with sciatica, right side: Secondary | ICD-10-CM | POA: Diagnosis not present

## 2023-01-13 DIAGNOSIS — R1031 Right lower quadrant pain: Secondary | ICD-10-CM | POA: Diagnosis not present

## 2023-01-13 LAB — CBC WITH DIFFERENTIAL/PLATELET
Abs Immature Granulocytes: 0.04 10*3/uL (ref 0.00–0.07)
Basophils Absolute: 0 10*3/uL (ref 0.0–0.1)
Basophils Relative: 1 %
Eosinophils Absolute: 0.2 10*3/uL (ref 0.0–0.5)
Eosinophils Relative: 2 %
HCT: 33.6 % — ABNORMAL LOW (ref 36.0–46.0)
Hemoglobin: 11.4 g/dL — ABNORMAL LOW (ref 12.0–15.0)
Immature Granulocytes: 1 %
Lymphocytes Relative: 24 %
Lymphs Abs: 2 10*3/uL (ref 0.7–4.0)
MCH: 28 pg (ref 26.0–34.0)
MCHC: 33.9 g/dL (ref 30.0–36.0)
MCV: 82.6 fL (ref 80.0–100.0)
Monocytes Absolute: 0.9 10*3/uL (ref 0.1–1.0)
Monocytes Relative: 11 %
Neutro Abs: 5.1 10*3/uL (ref 1.7–7.7)
Neutrophils Relative %: 61 %
Platelets: 174 10*3/uL (ref 150–400)
RBC: 4.07 MIL/uL (ref 3.87–5.11)
RDW: 15.5 % (ref 11.5–15.5)
WBC: 8.2 10*3/uL (ref 4.0–10.5)
nRBC: 0 % (ref 0.0–0.2)

## 2023-01-13 LAB — COMPREHENSIVE METABOLIC PANEL
ALT: 16 U/L (ref 0–44)
AST: 30 U/L (ref 15–41)
Albumin: 3.5 g/dL (ref 3.5–5.0)
Alkaline Phosphatase: 66 U/L (ref 38–126)
Anion gap: 9 (ref 5–15)
BUN: 19 mg/dL (ref 8–23)
CO2: 23 mmol/L (ref 22–32)
Calcium: 8.8 mg/dL — ABNORMAL LOW (ref 8.9–10.3)
Chloride: 106 mmol/L (ref 98–111)
Creatinine, Ser: 1.05 mg/dL — ABNORMAL HIGH (ref 0.44–1.00)
GFR, Estimated: 54 mL/min — ABNORMAL LOW (ref >60)
Glucose, Bld: 74 mg/dL (ref 70–99)
Potassium: 4.3 mmol/L (ref 3.5–5.1)
Sodium: 138 mmol/L (ref 135–145)
Total Bilirubin: 0.9 mg/dL (ref <1.2)
Total Protein: 7.2 g/dL (ref 6.5–8.1)

## 2023-01-13 LAB — LIPASE, BLOOD: Lipase: 38 U/L (ref 11–51)

## 2023-01-13 MED ORDER — MORPHINE SULFATE (PF) 4 MG/ML IV SOLN
4.0000 mg | Freq: Once | INTRAVENOUS | Status: AC
  Administered 2023-01-13: 4 mg via INTRAVENOUS
  Filled 2023-01-13: qty 1

## 2023-01-13 MED ORDER — ONDANSETRON HCL 4 MG/2ML IJ SOLN
4.0000 mg | Freq: Once | INTRAMUSCULAR | Status: AC
  Administered 2023-01-13: 4 mg via INTRAVENOUS
  Filled 2023-01-13: qty 2

## 2023-01-13 MED ORDER — METHYLPREDNISOLONE 4 MG PO TBPK: ORAL_TABLET | ORAL | 0 refills | Status: DC

## 2023-01-13 NOTE — ED Provider Notes (Signed)
[place] EMERGENCY DEPARTMENT AT [facility] Provider Note   CSN: [id number] Arrival date & time: 01/13/23  4098     History  Chief Complaint  Patient presents with   Abdominal Pain    RLQ to R lower back    Teresa Golden is a 77 y.o. female.  77 yo F with a chief complaints of right-sided abdominal and back pain.  This has been going on for a few days.  Denies injury feels like it does radiate a little bit into her leg.  Worse with twisting turning palpation.  No nausea no vomiting.  She feels like she is moving her bowels a little bit less than normal.  Denies loss of bowel or bladder denies loss of rectal sensation denies numbness or weakness to the leg.   Abdominal Pain      Home Medications Prior to Admission medications   Medication Sig Start Date End Date Taking? Authorizing Provider  methylPREDNISolone (MEDROL DOSEPAK) 4 MG TBPK tablet Day 1: 8mg  before breakfast, 4 mg after lunch, 4 mg after supper, and 8 mg at bedtime Day 2: 4 mg before breakfast, 4 mg after lunch, 4 mg  after supper, and 8 mg  at bedtime Day 3:  4 mg  before breakfast, 4 mg  after lunch, 4 mg after supper, and 4 mg  at bedtime Day 4: 4 mg  before breakfast, 4 mg  after lunch, and 4 mg at bedtime Day 5: 4 mg  before breakfast and 4 mg at bedtime Day 6: 4 mg  before breakfast 01/13/23  Yes Melene Plan, DO  allopurinol (ZYLOPRIM) 100 MG tablet Take 1 tablet (100 mg total) by mouth daily. 10/29/22   [name], NP  amLODipine (NORVASC) 5 MG tablet Take 1 tablet by mouth once daily 08/11/22   Dorothyann Peng, MD  AZO-CRANBERRY PO Take 1 tablet by mouth daily.    [provider]  B Complex-C (B-COMPLEX WITH VITAMIN C) tablet Take 1 tablet by mouth 3 (three) times a week.    [provider]  betamethasone dipropionate 0.05 % cream Apply topically 2 (two) times daily. 10/29/22   [name], NP  Biotin 11914 MCG TABS Take 10,000 mcg by mouth daily.    [provider]   cetirizine (ZYRTEC) 10 MG tablet Take 1 tablet (10 mg total) by mouth daily. 10/29/22   [name], NP  Cholecalciferol 5000 units TABS Take 5,000 Units by mouth daily.    [provider]  colchicine 0.6 MG tablet Take 1 tablet by mouth for gout attack. 10/10/21   Dorothyann Peng, MD  dapagliflozin propanediol (FARXIGA) 10 MG TABS tablet Take 1 tablet (10 mg total) by mouth daily. 08/13/22   Dorothyann Peng, MD  diclofenac Sodium (VOLTAREN) 1 % GEL APPLY 2 GRAMS TO AFFECTED AREA(S) 4 TIMES DAILY AS NEEDED Patient taking differently: Apply 2-4 g topically every 6 (six) hours as needed (to affected areas). 02/28/19   Dorothyann Peng, MD  gabapentin (NEURONTIN) 300 MG capsule Take 2 capsules (600 mg total) by mouth at bedtime. 01/12/23 10/09/23  Louann Sjogren, DPM  glucose blood (ONETOUCH VERIO) test strip USE TO CHECK BLOOD SUGAR   TWO TIMES A DAY AS         INSTRUCTED 01/24/21   Dorothyann Peng, MD  Insulin Degludec (TRESIBA) 100 UNIT/ML SOLN Inject 10 Units/oz/day into the skin at bedtime. Patient is taking at bedtime    [provider]  Lancets [facility] DELICA PLUS [place])  MISC USE TO CHECK BLOOD SUGARS  TWO TIMES A DAY AS         INSTRUCTED 01/24/21   Dorothyann Peng, MD  levothyroxine (SYNTHROID) 25 MCG tablet Take 1/2 tablet daily. 03/20/22   Dorothyann Peng, MD  losartan (COZAAR) 100 MG tablet Take 1 tablet (100 mg total) by mouth daily. 04/22/22   Dorothyann Peng, MD  metoprolol succinate (TOPROL-XL) 25 MG 24 hr tablet TAKE 1 TABLET AT BEDTIME   (DISCONTINUE COREG) 12/05/22   Dorothyann Peng, MD  Multiple Vitamins-Minerals (MULTIVITAMIN WITH MINERALS) tablet Take 1 tablet by mouth daily. Women's 50+    [provider]  pantoprazole (PROTONIX) 40 MG tablet Take 40 mg by mouth daily as needed. 09/15/21   [provider]  Semaglutide, 1 MG/DOSE, (OZEMPIC, 1 MG/DOSE,) 4 MG/3ML SOPN Inject 1 mg into the skin once a week. 04/24/21   Dorothyann Peng, MD  simvastatin (ZOCOR) 10  MG tablet Take 1 tablet (10 mg total) by mouth daily. 06/23/22   Dorothyann Peng, MD  triamcinolone ointment (KENALOG) 0.1 % Apply 1 Application topically 2 (two) times daily. 11/25/22   [provider]  SEMGLEE, YFGN, 100 UNIT/ML Pen Inject 9 Units into the skin at bedtime.  11/06/20  [provider]      Allergies    Meloxicam and Other    Review of Systems   Review of Systems  Gastrointestinal:  Positive for abdominal pain.    Physical Exam Updated Vital Signs BP (!) 139/100   Pulse 71   Temp (!) 97.1 F (36.2 C) (Oral)   Resp 18   SpO2 96%  Physical Exam Vitals and nursing note reviewed.  Constitutional:      General: She is not in acute distress.    Appearance: She is well-developed. She is not diaphoretic.  HENT:     Head: Normocephalic and atraumatic.  Eyes:     Pupils: Pupils are equal, round, and reactive to light.  Cardiovascular:     Rate and Rhythm: Normal rate and regular rhythm.     Heart sounds: No murmur heard.    No friction rub. No gallop.  Pulmonary:     Effort: Pulmonary effort is normal.     Breath sounds: No wheezing or rales.  Abdominal:     General: There is no distension.     Palpations: Abdomen is soft.     Tenderness: There is no abdominal tenderness.     Comments: Mild pain about the right lower quadrant but seems worse around the right iliac crest about the mid axillary line.  Musculoskeletal:        General: No tenderness.     Cervical back: Normal range of motion and neck supple.     Comments: Right lower back discomfort mostly about the SI joints.  No obvious rash.  No midline spinal tenderness step-offs or deformities.  Pulse motor and sensation intact of the right lower extremity.  Negative straight leg raise test.  Reflexes are 2+ and equal.  No clonus.  Skin:    General: Skin is warm and dry.  Neurological:     Mental Status: She is alert and oriented to person, place, and time.  Psychiatric:        Behavior:  Behavior normal.     ED Results / Procedures / Treatments   Labs (all labs ordered are listed, but only abnormal results are displayed) Labs Reviewed  CBC WITH DIFFERENTIAL/PLATELET - Abnormal; Notable for the following components:  Result Value   Hemoglobin 11.4 (*)    HCT 33.6 (*)    All other components within normal limits  COMPREHENSIVE METABOLIC PANEL - Abnormal; Notable for the following components:   Creatinine, Ser 1.05 (*)    Calcium 8.8 (*)    GFR, Estimated 54 (*)    All other components within normal limits  LIPASE, BLOOD    EKG None  Radiology CT Renal Stone Study  Result Date: 01/13/2023 CLINICAL DATA:  Abdominal/flank pain, stone suspected. Right lower quadrant and groin pain radiating to the back. EXAM: CT ABDOMEN AND PELVIS WITHOUT CONTRAST TECHNIQUE: Multidetector CT imaging of the abdomen and pelvis was performed following the standard protocol without IV contrast. RADIATION DOSE REDUCTION: This exam was performed according to the departmental dose-optimization program which includes automated exposure control, adjustment of the mA and/or kV according to patient size and/or use of iterative reconstruction technique. COMPARISON:  CT abdomen/pelvis 10/12/2020. Abdominal ultrasound 09/24/2022. FINDINGS: Lower chest: No acute abnormality. Moderate coronary artery calcifications. Hepatobiliary: Unchanged 3.1 cm hypoattenuating lesion in the right hepatic lobe, previously favored to represent a hemangioma. Prior cholecystectomy. No biliary dilatation. Pancreas: Unremarkable. No pancreatic ductal dilatation or surrounding inflammatory changes. Spleen: Normal in size without focal abnormality. Adrenals/Urinary Tract: Adrenal glands are unremarkable. Kidneys are normal, without renal calculi, focal lesion, or hydronephrosis. Bladder is unremarkable. Stomach/Bowel: Unchanged postoperative appearance from prior sleeve gastrectomy. No dilated loops of small bowel. Normal  appendix is visualized on axial image 37 series 2. Colon is unremarkable. No bowel wall thickening or surrounding inflammation. Vascular/Lymphatic: Aortic atherosclerosis. No enlarged abdominal or pelvic lymph nodes. Reproductive: Status post hysterectomy. No adnexal masses. Other: Fat containing periumbilical hernia. Musculoskeletal: Moderate degenerative changes of the right-greater-than-left hip joints. No acute bone findings. Please refer to same-day lumbar spine CT report for description of spine findings. IMPRESSION: 1. No acute findings in the abdomen or pelvis. Specifically, no evidence of nephrolithiasis or hydronephrosis. 2. Moderate coronary artery calcifications. Aortic Atherosclerosis (ICD10-I70.0). Electronically Signed   By: Orvan Falconer M.D.   On: 01/13/2023 11:58   CT L-SPINE NO CHARGE  Result Date: 01/13/2023 CLINICAL DATA:  Right lower quadrant and right groin pain radiating to the back. EXAM: CT LUMBAR SPINE WITHOUT CONTRAST TECHNIQUE: Multidetector CT imaging of the lumbar spine was performed without intravenous contrast administration. Multiplanar CT image reconstructions were also generated. RADIATION DOSE REDUCTION: This exam was performed according to the departmental dose-optimization program which includes automated exposure control, adjustment of the mA and/or kV according to patient size and/or use of iterative reconstruction technique. COMPARISON:  None Available. FINDINGS: Segmentation: Conventional numbering is assumed with 5 non-rib-bearing, lumbar type vertebral bodies. Alignment: Levoscoliotic curvature centered at L1-2. Grade 1 anterolisthesis of L4 on L5. Trace retrolisthesis of L5 on S1. Vertebrae: Severe degenerative endplate changes at L1-2 and L5-S1. No suspicious bone lesions. Paraspinal and other soft tissues: Please refer to same-day abdominal CT report. Disc levels: T12-L1: Disc bulge and severe bilateral facet arthropathy results in at least moderate spinal canal  stenosis, severe right and moderate left neural foraminal narrowing. L1-L2: Severe disc height loss, disc bulge and right-greater-than-left facet arthropathy results in mild spinal canal stenosis and moderate right neural foraminal narrowing. L2-L3: Right eccentric disc bulge and right-greater-than-left facet arthropathy results in at least moderate spinal canal stenosis and severe right neural foraminal narrowing. L3-L4: Disc bulge and severe bilateral facet arthropathy results in severe spinal canal stenosis and moderate bilateral neural foraminal narrowing. L4-L5: Anterolisthesis with uncovered disc and severe bilateral  facet arthropathy results in severe spinal canal stenosis and severe bilateral neural foraminal narrowing. L5-S1: Retrolisthesis and severe disc height loss with moderate bilateral facet arthropathy results in severe bilateral neural foraminal narrowing. No spinal canal stenosis. IMPRESSION: 1. Severe multilevel degenerative changes of the lumbar spine, with severe spinal canal stenosis at L3-4 and L4-5, and at least moderate spinal canal stenosis at T12-L1 and L2-3. 2. Moderate to severe neural foraminal narrowing at nearly every level, as detailed above. Electronically Signed   By: Orvan Falconer M.D.   On: 01/13/2023 11:52    Procedures Procedures    Medications Ordered in ED Medications  morphine (PF) 4 MG/ML injection 4 mg (4 mg Intravenous Given 01/13/23 0957)  ondansetron (ZOFRAN) injection 4 mg (4 mg Intravenous Given 01/13/23 1610)    ED Course/ Medical Decision Making/ A&P                                 Medical Decision Making Amount and/or Complexity of Data Reviewed Labs: ordered. Radiology: ordered.  Risk Prescription drug management.   77 yo F with a chief complaints of what sounds like musculoskeletal low back pain.  Going on for a few days now.  She is also describing pain in her abdomen and does have some pain on abdominal exam.  I think is less likely  to be intra-abdominal pathology however will obtain CT imaging to assess for possible appendicitis or other concerning intra-abdominal pathology.  Reformats of the L-spine.  Treat pain.  Reassess.  Patient is feeling better on repeat assessment.  CT imaging of the abdomen pelvis without obvious acute intra-abdominal pathology.  CT of the L-spine with multiple areas of degenerative disc disease.  She has some spinal stenosis that could account for some of her symptoms.  I discussed results with patient.  Will have her follow-up with her doctor in the office.  12:46 PM:  I have discussed the diagnosis/risks/treatment options with the patient.  Evaluation and diagnostic testing in the emergency department does not suggest an emergent condition requiring admission or immediate intervention beyond what has been performed at this time.  They will follow up with PCP. We also discussed returning to the ED immediately if new or worsening sx occur. We discussed the sx which are most concerning (e.g., sudden worsening pain, fever, inability to tolerate by mouth, cauda equina s/x) that necessitate immediate return. Medications administered to the patient during their visit and any new prescriptions provided to the patient are listed below.  Medications given during this visit Medications  morphine (PF) 4 MG/ML injection 4 mg (4 mg Intravenous Given 01/13/23 0957)  ondansetron (ZOFRAN) injection 4 mg (4 mg Intravenous Given 01/13/23 0958)     The patient appears reasonably screen and/or stabilized for discharge and I doubt any other medical condition or other [facility] requiring further screening, evaluation, or treatment in the ED at this time prior to discharge.          Final Clinical Impression(s) / ED Diagnoses Final diagnoses:  Acute right-sided low back pain with right-sided sciatica    Rx / DC Orders ED Discharge Orders          Ordered    methylPREDNISolone (MEDROL DOSEPAK) 4 MG TBPK tablet         01/13/23 1229              Melene Plan, DO 01/13/23 1246

## 2023-01-13 NOTE — Discharge Instructions (Signed)
Your back pain is most likely due to a muscular strain.  There is been a lot of research on back pain, unfortunately the only thing that seems to really help is Tylenol and ibuprofen.  Relative rest is also important to not lift greater than 10 pounds bending or twisting at the waist.  Please follow-up with your family physician.  The other thing that really seems to benefit patients is physical therapy which your doctor may send you for.  Please return to the emergency department for new numbness or weakness to your arms or legs. Difficulty with urinating or urinating or pooping on yourself.  Also if you cannot feel toilet paper when you wipe or get a fever.   Take the steroids as prescribed.  Use you voltaren gel as indicated on the tube. Also take tylenol 1000mg (2 extra strength) four times a day.

## 2023-01-13 NOTE — Progress Notes (Signed)
Psychotherapy Progress Note [facility] [name], PhD LP  Patient ID: ZANIE MCCOMMON [facility] "[name]")    MRN: [id number] Therapy format: Short phone call Date: 01/13/2023      Start: 8:19a     Stop: 8:29a     Time Spent: 10 min Location: Telehealth visit -- I connected with this patient by an approved telecommunication method (audio only), with her informed consent, and verifying identity and patient privacy.  I was located at my office and patient at her home.  As needed, we discussed the limitations, risks, and security and privacy concerns associated with telehealth service, including the availability and conditions which currently govern in-person appointments and the possibility that 3rd-party payment may not be fully guaranteed and she may be responsible for charges.  After she indicated understanding, we proceeded with the session.  Also discussed treatment planning, as needed, including ongoing verbal agreement with the plan, the opportunity to ask and answer all questions, her demonstrated understanding of instructions, and her readiness to call the office should symptoms worsen or she feels she is in a crisis state and needs more immediate and tangible assistance.   Session narrative (presenting needs, interim history, self-report of stressors and symptoms, applications of prior therapy, status changes, and interventions made in session) In severe pain when reached, says she has severe lower right quadrant pain since yesterday, currently awaiting EMS, not able to reach office to cancel before this scheduled time.  Noted flatulence, able to move, but persistent sharp pain despite, since yesterday.  Has taken Aleve twice with modest relief.   Has looked up appendicitis and pancreatitis, does not think it's either, based on information found and her own surgical history, just thought maybe being diabetic might dispose her to pancreatitis.  Suggested she prepare to be asked how  stool is moving, in c/o partial bowel blockage.  Sounds plausibly like diverticulosis.  Allowed to cut short as EMS arrived.  Diagnosis:   ICD-10-CM   1. Adjustment disorder with mixed anxiety and depressed mood  F43.23     2. Multiple comorbid health conditions  R69     3. Right lower quadrant abdominal pain  R10.31      Plan:  Allow to pursue emergency services as intended Followup -- Return for time as already scheduled.  Next scheduled visit with me 01/30/2023.  Next scheduled in this office 01/30/2023.  Robley Fries, PhD [name], PhD LP Clinical Psychologist, [facility] [address] [place], [place] [address] [phone]

## 2023-01-13 NOTE — ED Triage Notes (Signed)
Pt states all of a sudden yesterday morning having rlq pain/ right sided groin pain that radiates to back, was relieved by aleve yesterday

## 2023-01-14 ENCOUNTER — Ambulatory Visit: Payer: Self-pay

## 2023-01-14 NOTE — Patient Outreach (Signed)
  Care Coordination   Follow Up Visit Note   01/14/2023 Name: Teresa Golden MRN: [id number] DOB: [birth year]  Teresa Golden is a 77 y.o. year old female who sees Dorothyann Peng, MD for primary care. I spoke with  Margrett Rud by phone today.  What matters to the patients health and wellness today?  Patient would like to recover from acute right sided pain related to her spinal disc problem.     Goals Addressed             This Visit's Progress    To improve acute right side pain       Care Coordination Interventions: Evaluation of current treatment plan related to acute right side pain  and patient's adherence to plan as established by provider Determined patient experienced acute right sided pain evaluated by [facility] ED  Review of patient status, including review of consultant's reports, relevant laboratory and other test results, and medications completed Sent in basket message to Dr. Allyne Gee asking for further review of the prescribed Prednisone dosing prescribed by the ED visit  Discussed with patient she plans to outreach to her Orthopedic doctor for further evaluation  Encouraged patient to resume her stretches and home exercises as previously instructed by her PT Educated patient about the importance of closely monitoring blood sugars while taking Prednisone  Patient will follow up with her Orthopedic MD for further evaluation of her right sided pain related to disc and muscle strain Patient will resume home exercises and stretches as previously instructed Patient will continue to work with nurse care coordinator for chronic disease management and care coordination needs       Interventions Today    Flowsheet Row Most Recent Value  Chronic Disease   Chronic disease during today's visit Other, Diabetes  [s/p ED at [facility] on 01/13/23, right acute side pain]  General Interventions   General Interventions Discussed/Reviewed General Interventions Discussed, General  Interventions Reviewed, Doctor Visits, Communication with  Communication with PCP/Specialists  [Dr. Sanders]  Exercise Interventions   Exercise Discussed/Reviewed Physical Activity  Physical Activity Discussed/Reviewed Physical Activity Reviewed, Physical Activity Discussed  Education Interventions   Education Provided Provided Education  Provided Verbal Education On Medication, When to see the doctor  Pharmacy Interventions   Pharmacy Dicussed/Reviewed Pharmacy Topics Reviewed, Pharmacy Topics Discussed, Medications and their functions          SDOH assessments and interventions completed:  No     Care Coordination Interventions:  Yes, provided   Follow up plan: Follow up call scheduled for 04/13/23 @10 :30 AM    Encounter Outcome:  Patient Visit Completed

## 2023-01-14 NOTE — Patient Instructions (Signed)
Visit Information  Thank you for taking time to visit with me today. Please don't hesitate to contact me if I can be of assistance to you.   Following are the goals we discussed today:   Goals Addressed             This Visit's Progress    To improve acute right side pain       Care Coordination Interventions: Evaluation of current treatment plan related to acute right side pain  and patient's adherence to plan as established by provider Determined patient experienced acute right sided pain evaluated by [facility] ED  Review of patient status, including review of consultant's reports, relevant laboratory and other test results, and medications completed Sent in basket message to Dr. Allyne Gee asking for further review of the prescribed Prednisone dosing prescribed by the ED visit  Discussed with patient she plans to outreach to her Orthopedic doctor for further evaluation  Encouraged patient to resume her stretches and home exercises as previously instructed by her PT Educated patient about the importance of closely monitoring blood sugars while taking Prednisone  Patient will follow up with her Orthopedic MD for further evaluation of her right sided pain related to disc and muscle strain Patient will resume home exercises and stretches as previously instructed Patient will continue to work with nurse care coordinator for chronic disease management and care coordination needs         Our next appointment is by telephone on 04/13/23 at 10:30 AM   Please call the care guide team at [phone] if you need to cancel or reschedule your appointment.   If you are experiencing a Mental Health or Behavioral Health Crisis or need someone to talk to, please call 1-800-273-TALK (toll free, 24 hour hotline)  Patient verbalizes understanding of instructions and care plan provided today and agrees to view in MyChart. Active MyChart status and patient understanding of how to access instructions  and care plan via MyChart confirmed with patient.     Delsa Sale RN BSN CCM [place]  [facility], [facility] Nurse Care Coordinator  Direct Dial: [phone] Website: [email or web address]

## 2023-01-15 ENCOUNTER — Telehealth: Payer: Self-pay

## 2023-01-15 ENCOUNTER — Other Ambulatory Visit: Payer: Self-pay | Admitting: Podiatry

## 2023-01-15 NOTE — Transitions of Care (Post Inpatient/ED Visit) (Signed)
   01/15/2023  Name: Teresa Golden MRN: [id number] DOB: [birth year]  Today's TOC FU Call Status:   Patient's Name and Date of Birth confirmed.  Transition Care Management Follow-up Telephone Call Date of Discharge: 01/13/23 Discharge Facility: [facility]) How have you been since you were released from the hospital?: Better Any questions or concerns?: No  Items Reviewed: Did you receive and understand the discharge instructions provided?: Yes Medications obtained,verified, and reconciled?: Yes (Medications Reviewed) Any new allergies since your discharge?: No Dietary orders reviewed?: NA Do you have support at home?: No (she reports living alone. she does have family she can call.)  Medications Reviewed Today: Medications Reviewed Today   Medications were not reviewed in this encounter     Home Care and Equipment/Supplies: Were Home Health Services Ordered?: No Any new equipment or medical supplies ordered?: No  Functional Questionnaire: Do you need assistance with bathing/showering or dressing?: No Do you need assistance with meal preparation?: No Do you need assistance with eating?: No Do you have difficulty maintaining continence: No Do you need assistance with getting out of bed/getting out of a chair/moving?: No Do you have difficulty managing or taking your medications?: No  Follow up appointments reviewed: PCP Follow-up appointment confirmed?: NA (patient declined appointment offered for 11/22.) Specialist Hospital Follow-up appointment confirmed?: No Do you need transportation to your follow-up appointment?: No Do you understand care options if your condition(s) worsen?: Yes-patient verbalized understanding    SIGNATURE Randa Lynn, CMA

## 2023-01-19 ENCOUNTER — Ambulatory Visit: Payer: Medicare HMO | Admitting: Internal Medicine

## 2023-01-19 ENCOUNTER — Other Ambulatory Visit: Payer: Self-pay

## 2023-01-19 MED ORDER — GABAPENTIN 300 MG PO CAPS: 600.0000 mg | ORAL_CAPSULE | Freq: Every day | ORAL | 2 refills | Status: DC

## 2023-01-19 NOTE — Progress Notes (Deleted)
I,Purity Irmen T Deloria Lair, CMA,acting as a Neurosurgeon for [name], MD.,have documented all relevant documentation on the behalf of [name], MD,as directed by  [name], MD while in the presence of [name], MD.  Subjective:  [name] , female    DOB: June 04, 1945 , 77 y.o.   MRN: [id number]  No chief complaint on file.   HPI  Patient presents today for ED follow up. She visited [place] on 01/13/2023 for abdominal pain.      Past Medical History:  Diagnosis Date   Arthritis    Chronic kidney disease    self reports ckd stage 3    Depression    Diabetes mellitus, type II, insulin dependent (HCC)    With neurologic complications. 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    Hypertensive nephropathy 07/05/2018   Hypothyroidism    Hypothyroidism 02/06/2018   Morbid obesity with BMI of 50.0-59.9, adult (HCC)    Scoliosis      Family History  Problem Relation Age of Onset   Heart attack Mother    Heart failure Mother    Stroke Father      Current Outpatient Medications:    allopurinol (ZYLOPRIM) 100 MG tablet, Take 1 tablet (100 mg total) by mouth daily., Disp: 90 tablet, Rfl: 2   amLODipine (NORVASC) 5 MG tablet, Take 1 tablet by mouth once daily, Disp: 90 tablet, Rfl: 0   AZO-CRANBERRY PO, Take 1 tablet by mouth daily., Disp: , Rfl:    B Complex-C (B-COMPLEX WITH VITAMIN C) tablet, Take 1 tablet by mouth 3 (three) times a week., Disp: , Rfl:    betamethasone dipropionate 0.05 % cream, Apply topically 2 (two) times daily., Disp: 30 g, Rfl: 0   Biotin 84696 MCG TABS, Take 10,000 mcg by mouth daily., Disp: , Rfl:    cetirizine (ZYRTEC) 10 MG tablet, Take 1 tablet (10 mg total) by mouth daily., Disp: 30  tablet, Rfl: 1   Cholecalciferol 5000 units TABS, Take 5,000 Units by mouth daily., Disp: , Rfl:    colchicine 0.6 MG tablet, Take 1 tablet by mouth for gout attack., Disp: 30 tablet, Rfl: 1   dapagliflozin propanediol (FARXIGA) 10 MG TABS tablet, Take 1 tablet (10 mg total) by mouth daily., Disp: 90 tablet, Rfl: 3   diclofenac Sodium (VOLTAREN) 1 % GEL, APPLY 2 GRAMS TO AFFECTED AREA(S) 4 TIMES DAILY AS NEEDED (Patient taking differently: Apply 2-4 g topically every 6 (six) hours as needed (to affected areas).), Disp: 200 g, Rfl: 2   gabapentin (NEURONTIN) 300 MG capsule, Take 2 capsules (600 mg total) by mouth at bedtime., Disp: 180 capsule, Rfl: 2   glucose blood (ONETOUCH VERIO) test strip, USE TO CHECK BLOOD SUGAR   TWO TIMES A DAY AS         INSTRUCTED, Disp: 200 strip, Rfl: 3   Insulin Degludec (TRESIBA) 100 UNIT/ML SOLN, Inject 10 Units/oz/day into the skin at bedtime. Patient is taking at bedtime, Disp: , Rfl:    Lancets (ONETOUCH DELICA PLUS LANCET33G) MISC, USE TO CHECK BLOOD SUGARS  TWO TIMES A DAY AS         INSTRUCTED, Disp: 200 each, Rfl:  3   levothyroxine (SYNTHROID) 25 MCG tablet, Take 1/2 tablet daily., Disp: 90 tablet, Rfl: 1   losartan (COZAAR) 100 MG tablet, Take 1 tablet (100 mg total) by mouth daily., Disp: 90 tablet, Rfl: 2   methylPREDNISolone (MEDROL DOSEPAK) 4 MG TBPK tablet, Day 1: 8mg  before breakfast, 4 mg after lunch, 4 mg after supper, and 8 mg at bedtime Day 2: 4 mg before breakfast, 4 mg after lunch, 4 mg  after supper, and 8 mg  at bedtime Day 3:  4 mg  before breakfast, 4 mg  after lunch, 4 mg after supper, and 4 mg  at bedtime Day 4: 4 mg  before breakfast, 4 mg  after lunch, and 4 mg at bedtime Day 5: 4 mg  before breakfast and 4 mg at bedtime Day 6: 4 mg  before breakfast, Disp: 1 each, Rfl: 0   metoprolol succinate (TOPROL-XL) 25 MG 24 hr tablet, TAKE 1 TABLET AT BEDTIME   (DISCONTINUE COREG), Disp: 90 tablet, Rfl: 1   Multiple Vitamins-Minerals (MULTIVITAMIN WITH  MINERALS) tablet, Take 1 tablet by mouth daily. Women's 50+, Disp: , Rfl:    pantoprazole (PROTONIX) 40 MG tablet, Take 40 mg by mouth daily as needed., Disp: , Rfl:    Semaglutide, 1 MG/DOSE, (OZEMPIC, 1 MG/DOSE,) 4 MG/3ML SOPN, Inject 1 mg into the skin once a week., Disp: 9 mL, Rfl: 1   simvastatin (ZOCOR) 10 MG tablet, Take 1 tablet (10 mg total) by mouth daily., Disp: 90 tablet, Rfl: 0   triamcinolone ointment (KENALOG) 0.1 %, Apply 1 Application topically 2 (two) times daily., Disp: , Rfl:    Allergies  Allergen Reactions   Meloxicam Other (See Comments)    Fever; muscle aches; "flu-like" symptoms "Allergic," per [facility]   Other Other (See Comments)    "Rose fever and hay fever"     Review of Systems  Constitutional: Negative.   Respiratory: Negative.    Cardiovascular: Negative.   Neurological: Negative.   Psychiatric/Behavioral: Negative.       There were no vitals filed for this visit. There is no height or weight on file to calculate BMI.  Wt Readings from Last 3 Encounters:  01/01/23 205 lb 12.8 oz (93.4 kg)  11/26/22 202 lb (91.6 kg)  10/29/22 199 lb (90.3 kg)     Objective:  Physical Exam      Assessment And Plan:  There are no diagnoses linked to this encounter.   No follow-ups on file.  Patient was given opportunity to ask questions. Patient verbalized understanding of the plan and was able to repeat key elements of the plan. All questions were answered to their satisfaction.  [name], MD  I, [name], MD, have reviewed all documentation for this visit. The documentation on 01/19/23 for the exam, diagnosis, procedures, and orders are all accurate and complete.   IF YOU HAVE BEEN REFERRED TO A SPECIALIST, IT MAY TAKE 1-2 WEEKS TO SCHEDULE/PROCESS THE REFERRAL. IF YOU HAVE NOT HEARD FROM US/SPECIALIST IN TWO WEEKS, PLEASE GIVE [place] A CALL AT [phone] X [phone].   THE PATIENT IS ENCOURAGED TO PRACTICE SOCIAL DISTANCING DUE TO THE COVID-19 PANDEMIC.

## 2023-01-20 ENCOUNTER — Encounter: Payer: Self-pay | Admitting: Family Medicine

## 2023-01-20 ENCOUNTER — Telehealth (INDEPENDENT_AMBULATORY_CARE_PROVIDER_SITE_OTHER): Payer: Medicare HMO | Admitting: Family Medicine

## 2023-01-20 VITALS — BP 128/70 | Ht 65.0 in | Wt 205.0 lb

## 2023-01-20 DIAGNOSIS — N1831 Chronic kidney disease, stage 3a: Secondary | ICD-10-CM | POA: Diagnosis not present

## 2023-01-20 DIAGNOSIS — M5440 Lumbago with sciatica, unspecified side: Secondary | ICD-10-CM

## 2023-01-20 NOTE — Progress Notes (Signed)
Virtual Visit via Video Note  I,Jameka J Llittleton, CMA,acting as a scribe for [employer], NP.,have documented all relevant documentation on the behalf of Teresa Hose, NP,as directed by  Teresa Hose, NP while in the presence of Teresa Hose, NP.  I connected with Teresa Golden on 01/24/23 at  3:40 PM EST by a video enabled telemedicine application and verified that I am speaking with the correct person using two identifiers.  Patient Location: Home Provider Location: Home Office  I discussed the limitations, risks, security, and privacy concerns of performing an evaluation and management service by video and the availability of in person appointments. I also discussed with the patient that there may be a patient responsible charge related to this service. The patient expressed understanding and agreed to proceed.  Subjective: PCP: Dorothyann Peng, MD  Chief Complaint  Patient presents with   ER f/u    Patient presents today for  a ER f/u.    Patient is a 77 year old female who presents today for a ER follow up. Patient she went to the ER on 01/13/2023 for right sided abdominal pain and back pain that radiated to her right leg. Patient had CT Lumbar spine and CT Renal performed,and several degenerative changes noted. Patient states she was given a DOSEPAK of Prednisone 4 mg which she states she has stopped taking because it made her feel weird and sometimes increases her BS too, which she did not want to happen considering the fact that she is a diabetic.She also reports she has an appt with [facility] on the 02/02/23.   Patient states instead she has been taking 2 tablets of Aleve daily, advised patient to use Tylenol arthritis instead due to her medical diagnosis of CKD. Patient states she never got a call from the Nephrologist's office, she wants her referral resent.     ROS: Per HPI  Current Outpatient Medications:    allopurinol (ZYLOPRIM) 100 MG tablet, Take 1 tablet (100 mg  total) by mouth daily., Disp: 90 tablet, Rfl: 2   amLODipine (NORVASC) 5 MG tablet, Take 1 tablet by mouth once daily, Disp: 90 tablet, Rfl: 0   AZO-CRANBERRY PO, Take 1 tablet by mouth daily., Disp: , Rfl:    B Complex-C (B-COMPLEX WITH VITAMIN C) tablet, Take 1 tablet by mouth 3 (three) times a week., Disp: , Rfl:    betamethasone dipropionate 0.05 % cream, Apply topically 2 (two) times daily., Disp: 30 g, Rfl: 0   Biotin 45409 MCG TABS, Take 10,000 mcg by mouth daily., Disp: , Rfl:    cetirizine (ZYRTEC) 10 MG tablet, Take 1 tablet (10 mg total) by mouth daily., Disp: 30 tablet, Rfl: 1   Cholecalciferol 5000 units TABS, Take 5,000 Units by mouth daily., Disp: , Rfl:    colchicine 0.6 MG tablet, Take 1 tablet by mouth for gout attack., Disp: 30 tablet, Rfl: 1   dapagliflozin propanediol (FARXIGA) 10 MG TABS tablet, Take 1 tablet (10 mg total) by mouth daily., Disp: 90 tablet, Rfl: 3   diclofenac Sodium (VOLTAREN) 1 % GEL, APPLY 2 GRAMS TO AFFECTED AREA(S) 4 TIMES DAILY AS NEEDED (Patient taking differently: Apply 2-4 g topically every 6 (six) hours as needed (to affected areas).), Disp: 200 g, Rfl: 2   gabapentin (NEURONTIN) 300 MG capsule, Take 2 capsules (600 mg total) by mouth at bedtime., Disp: 180 capsule, Rfl: 2   glucose blood (ONETOUCH VERIO) test strip, USE TO CHECK BLOOD SUGAR   TWO TIMES A  DAY AS         INSTRUCTED, Disp: 200 strip, Rfl: 3   Insulin Degludec (TRESIBA) 100 UNIT/ML SOLN, Inject 10 Units/oz/day into the skin at bedtime. Patient is taking at bedtime, Disp: , Rfl:    Lancets (ONETOUCH DELICA PLUS LANCET33G) MISC, USE TO CHECK BLOOD SUGARS  TWO TIMES A DAY AS         INSTRUCTED, Disp: 200 each, Rfl: 3   levothyroxine (SYNTHROID) 25 MCG tablet, Take 1/2 tablet daily., Disp: 90 tablet, Rfl: 1   losartan (COZAAR) 100 MG tablet, Take 1 tablet (100 mg total) by mouth daily., Disp: 90 tablet, Rfl: 2   metoprolol succinate (TOPROL-XL) 25 MG 24 hr tablet, TAKE 1 TABLET AT BEDTIME    (DISCONTINUE COREG), Disp: 90 tablet, Rfl: 1   Multiple Vitamins-Minerals (MULTIVITAMIN WITH MINERALS) tablet, Take 1 tablet by mouth daily. Women's 50+, Disp: , Rfl:    pantoprazole (PROTONIX) 40 MG tablet, Take 40 mg by mouth daily as needed., Disp: , Rfl:    Semaglutide, 1 MG/DOSE, (OZEMPIC, 1 MG/DOSE,) 4 MG/3ML SOPN, Inject 1 mg into the skin once a week., Disp: 9 mL, Rfl: 1   simvastatin (ZOCOR) 10 MG tablet, Take 1 tablet (10 mg total) by mouth daily., Disp: 90 tablet, Rfl: 0   triamcinolone ointment (KENALOG) 0.1 %, Apply 1 Application topically 2 (two) times daily., Disp: , Rfl:    methylPREDNISolone (MEDROL DOSEPAK) 4 MG TBPK tablet, Day 1: 8mg  before breakfast, 4 mg after lunch, 4 mg after supper, and 8 mg at bedtime Day 2: 4 mg before breakfast, 4 mg after lunch, 4 mg  after supper, and 8 mg  at bedtime Day 3:  4 mg  before breakfast, 4 mg  after lunch, 4 mg after supper, and 4 mg  at bedtime Day 4: 4 mg  before breakfast, 4 mg  after lunch, and 4 mg at bedtime Day 5: 4 mg  before breakfast and 4 mg at bedtime Day 6: 4 mg  before breakfast (Patient not taking: Reported on 01/20/2023), Disp: 1 each, Rfl: 0  Observations/Objective: Today's Vitals   01/20/23 1640  BP: 128/70  Weight: 205 lb (93 kg)  Height: 5\' 5"  (1.651 m)   Physical Exam Neurological:     Mental Status: She is alert.     Assessment and Plan: Acute right-sided low back pain with sciatica, sciatica laterality unspecified Assessment & Plan: Appt at [facility] 02/02/23   Stage 3a chronic kidney disease (HCC) -     Ambulatory referral to Nephrology    Follow Up Instructions:  Keep Scheduled Appt   I discussed the assessment and treatment plan with the patient. The patient was provided an opportunity to ask questions, and all were answered. The patient agreed with the plan and demonstrated an understanding of the instructions.   The patient was advised to call back or seek an in-person evaluation if the  symptoms worsen or if the condition fails to improve as anticipated.  The above assessment and management plan was discussed with the patient. The patient verbalized understanding of and has agreed to the management plan.   I, Teresa Hose, NP, have reviewed all documentation for this visit. The documentation on 01/23/2023 for the exam, diagnosis, procedures, and orders are all accurate and complete.    IF YOU HAVE BEEN REFERRED TO A SPECIALIST, IT MAY TAKE 1-2 WEEKS TO SCHEDULE/PROCESS THE REFERRAL. IF YOU HAVE NOT HEARD FROM US/SPECIALIST IN TWO WEEKS, PLEASE GIVE [place] A  CALL AT [phone].     [name]

## 2023-01-21 ENCOUNTER — Other Ambulatory Visit: Payer: Self-pay | Admitting: Family Medicine

## 2023-01-24 NOTE — Assessment & Plan Note (Signed)
Appt at [facility] 02/02/23

## 2023-01-27 ENCOUNTER — Ambulatory Visit
Admission: RE | Admit: 2023-01-27 | Discharge: 2023-01-27 | Disposition: A | Discharge status: Home / Self Care | Payer: Medicare HMO | Source: Ambulatory Visit | Attending: Internal Medicine | Admitting: Internal Medicine

## 2023-01-27 DIAGNOSIS — E2839 Other primary ovarian failure: Secondary | ICD-10-CM

## 2023-01-30 ENCOUNTER — Ambulatory Visit (INDEPENDENT_AMBULATORY_CARE_PROVIDER_SITE_OTHER): Payer: Medicare HMO | Admitting: Psychiatry

## 2023-01-30 DIAGNOSIS — F4323 Adjustment disorder with mixed anxiety and depressed mood: Secondary | ICD-10-CM | POA: Diagnosis not present

## 2023-01-30 DIAGNOSIS — Z630 Problems in relationship with spouse or partner: Secondary | ICD-10-CM | POA: Diagnosis not present

## 2023-01-30 DIAGNOSIS — F3341 Major depressive disorder, recurrent, in partial remission: Secondary | ICD-10-CM

## 2023-01-30 DIAGNOSIS — R69 Illness, unspecified: Secondary | ICD-10-CM | POA: Diagnosis not present

## 2023-01-30 NOTE — Progress Notes (Signed)
 Psychotherapy Progress Note [facility] [name], PhD LP  [name] [facility] "Teresa Golden")    MRN: [id number] Therapy format: Individual psychotherapy Date: 01/30/2023      Start: 8:15a     Stop: 9:05a     Time Spent: 50 min Location: In-person   Session narrative (presenting needs, interim history, self-report of stressors and symptoms, applications of prior therapy, status changes, and interventions made in session) Amazed and concerned, like many, about political news, trying to get her bearings what to expect.  Says "I'm OK."    Concerned with [name] -- had dinner out, says he was a gentleman.  Phone calls usually daily.  He'll be coming back to town for a specialized MRI, with lunch plans afterward.  Mixed feelings about sex with him, which has resumed.  Agreed it's not particularly about sin but about a worth it vs. not worth it decision.  Her inclination to try nonverbals, like inviting him back to the house and just sitting down with TV, but advocated for her to be willing to use her words, too.  Admittedly consenting, and admittedly enjoys the sex, but it feels more of a letdown afterward.  Supported her in pursuing a fully honest relationship, not being a secret again, and getting her quiet question answered what she means to him, really, and whether she is again selling herself short.  Meanwhile, recites social engagements she has coming, and notes being impressed with her blind friends to the extent of what responsibility it is (kind of unwanted) to look out for them in unfamiliar surroundings, and indications of taking her time and effort for granted (waffling on plans after she cooked, arriving late).  Again here, would rather respond nonverbally (downgrade time commitments with them) than assert herself.  Support/validation provided.   Therapeutic modalities: Cognitive Behavioral Therapy, Solution-Oriented/Positive Psychology, and  Ego-Supportive  Mental Status/Observations:  Appearance:   Casual     Behavior:  Appropriate  Motor:  Normal  Speech/Language:   Clear and Coherent  Affect:  Appropriate  Mood:  More down, somewhat  Thought process:  normal  Thought content:    WNL  Sensory/Perceptual disturbances:    WNL  Orientation:  Fully oriented  Attention:  Good    Concentration:  Fair  Memory:  WNL  Insight:    Good  Judgment:   Good  Impulse Control:  Good   Risk Assessment: Danger to Self: No Self-injurious Behavior: No Danger to Others: No Physical Aggression / Violence: No Duty to Warn: No Access to Firearms a concern: No  Assessment of progress:  stabilized  Diagnosis:   ICD-10-CM   1. Major depressive disorder, recurrent, in partial remission (HCC)  F33.41     2. Adjustment disorder with mixed anxiety and depressed mood  F43.23     3. Multiple comorbid health conditions  R69     4. Relationship problem between partners  Z63.0      Plan:  Living situation -- Endorse researching more reliable, better suited living space as noted, with April target to move.  As needed, research tenant's rights in the event landlady defaults on the mortgage.  For housekeeping, try to make a practice of smaller, more frequent efforts and/or bringing in help as needed, with the mindset that it's all a "gift to the lady who lives here". Socialization and relationships -- Continue involvement in church and suitable groups as able.  Develop local friendships and "bonus" mother-daughter relationship at interest.  Offer to support groups such as Co[employer] or Al-Anon, and active elder facilities like [employer], [employer].  Endorse option to work part-time if truly able and interested.  Endorse choice over dating, so long as she can keep her integrity, maintain the view that she is worth working for and being honest for, and does not have to make any desperate decisions to feel loved and lovable.  Lonely does  not have to be desperate.   Self-esteem -- Per her faith, continue to practice self-esteem grounded in God's unconditional love.  Self-affirm that whatever she might deem "stupid", she has either learned, improved, or was doing the best she could figure out at the time, while trying to deal with strong and natural feelings, e.g., loneliness.  When tempted to feel like a failure for lack of family, or for acting quickly on desires, reframe self-image as a survivor who had to take on the harshest of losses and is legitimately tempted. General health -- Endorse any healthy activity, to include pool/gym if able and interested.  Address diabetes and renal self-care promptly and reliably.  Recommend taming light in late evening and after bed, with options to use soothing sound and orange lenses. Family -- Endorse boundary/limits with [name].  Endorse "bonus" mother-daughter relationship. Other recommendations/advice -- As may be noted above.  Continue to utilize previously learned skills ad lib. Medication compliance -- Maintain medication as prescribed and work faithfully with relevant prescriber(s) if any changes are desired or seem indicated. Crisis service -- Aware of call list and work-in appts.  Call the clinic on-call service, 988/hotline, 911, or present to [facility] or ER if any life-threatening psychiatric crisis. Followup -- Return for time as already scheduled.  Next scheduled visit with me 03/04/2023.  Next scheduled in this office 03/04/2023.  Robley Fries, PhD [name], PhD LP Clinical Psychologist, [facility] [address], [address] [place], [place] [address] [phone]

## 2023-02-09 ENCOUNTER — Telehealth: Payer: Self-pay

## 2023-02-09 ENCOUNTER — Ambulatory Visit: Payer: Medicare HMO | Admitting: Neurology

## 2023-02-09 DIAGNOSIS — R0689 Other abnormalities of breathing: Secondary | ICD-10-CM

## 2023-02-09 DIAGNOSIS — R0683 Snoring: Secondary | ICD-10-CM

## 2023-02-09 DIAGNOSIS — E66811 Obesity, class 1: Secondary | ICD-10-CM

## 2023-02-09 DIAGNOSIS — K219 Gastro-esophageal reflux disease without esophagitis: Secondary | ICD-10-CM

## 2023-02-09 NOTE — Telephone Encounter (Addendum)
PAP: Application for Ozempic Evaristo Bury has been submitted to PAP Companies: [employer], Oceanographer portion has been sent to Dr. Dorothyann Peng for review.

## 2023-02-12 NOTE — Telephone Encounter (Signed)
Received provider portion of application, faxed completed provider from and insurance card to [employer]

## 2023-02-16 ENCOUNTER — Ambulatory Visit: Payer: Medicare HMO | Admitting: Psychiatry

## 2023-02-19 NOTE — Telephone Encounter (Signed)
PAP: Patient assistance application for Ozempic and Evaristo Bury has been approved by [employer] Companies: [employer] from 02/25/2023 to 02/24/2024. Medication should be delivered to PAP Delivery: Provider's office For further shipping updates, please contact [employer] at [phone] Pt ID is: [id number]

## 2023-02-19 NOTE — Progress Notes (Signed)
Pharmacy Medication Assistance Program Note    02/19/2023  [name], female   DOB: [birth year], 77 y.o.   MRN: [id number]     02/09/2023  Outreach Medication One  Manufacturer Medication One [employer] Drugs Ozempic  Dose of Ozempic 1MG /WEEK  Type of Radiographer, therapeutic Assistance  Name of Prescriber ROBYN SANDERS  Date Application Received From Provider 02/12/2023  Date Application Submitted to Manufacturer 02/12/2023  Method Application Sent to Manufacturer Fax  Patient Assistance Determination Approved  Approval Start Date 02/25/2023  Approval End Date 02/24/2024  Patient Notification Method MyChart     Signature [name], CPHT/Patient [facility]ocate [place] Direct Line: [phone] Fax: [phone]

## 2023-03-04 ENCOUNTER — Ambulatory Visit (INDEPENDENT_AMBULATORY_CARE_PROVIDER_SITE_OTHER): Payer: Medicare HMO | Admitting: Psychiatry

## 2023-03-04 DIAGNOSIS — R69 Illness, unspecified: Secondary | ICD-10-CM | POA: Diagnosis not present

## 2023-03-04 DIAGNOSIS — F4323 Adjustment disorder with mixed anxiety and depressed mood: Secondary | ICD-10-CM | POA: Diagnosis not present

## 2023-03-04 DIAGNOSIS — F3341 Major depressive disorder, recurrent, in partial remission: Secondary | ICD-10-CM | POA: Diagnosis not present

## 2023-03-04 DIAGNOSIS — Z599 Problem related to housing and economic circumstances, unspecified: Secondary | ICD-10-CM

## 2023-03-04 DIAGNOSIS — Z630 Problems in relationship with spouse or partner: Secondary | ICD-10-CM

## 2023-03-04 NOTE — Progress Notes (Signed)
 Psychotherapy Progress Note [facility] Teresa Czar, PhD LP  [name] [facility] "Teresa Golden")    MRN: [id number] Therapy format: Individual psychotherapy Date: 03/04/2023      Start: 8:09a     Stop: 8:58a     Time Spent: 49 min Location: In-person   Session narrative (presenting needs, interim history, self-report of stressors and symptoms, applications of prior therapy, status changes, and interventions made in session) Recent trouble with her bank account getting hacked after using debit card at a restaurant, working through the dispute and putting up with frozen assets.  Enrolled in a clinical trial but deciding to withdraw, since the drug subject to the research is one her physician has advised to take PRN only.  Allowed to call ride service in session to work out a change -- noted she thought she needed to cancel her ride with Teresa Golden and call another service but found out she could just change destination (home).  Supportively challenged to consider asking before assuming limits on what can be done.  Still enjoying new church and women's gatherings, including an online prayer group.  Affirmed and encouraged.  Tired of Teresa Golden, her out of town lover.  Getting clearer they are not a fully compatible couple, even if he works through his ostensive relationship with another woman.  Finding he's too "country", meaning chauvinist, chronically late, defensive, and a lot of subtle and not so subtle demands that she just take him how he is and let him tell her she's rude if she objects, or declines sex.  Complicated at times by having accepted favors and been loaned money and the suggestion something is owed in trade, but on balance well worth restoring some dignity.  Endorsed making the more dispassionate decision to end the relationship, including sex appreciated but not beholden to it.   Forming a generational friendship with a 8ish man from [place].  Platonic, more  family-like, and an opportunity for her to help educate him on transplanting culture.  Support/validation provided.   Therapeutic modalities: Cognitive Behavioral Therapy, Solution-Oriented/Positive Psychology, Environmental manager, and Faith-sensitive  Mental Status/Observations:  Appearance:   Casual     Behavior:  Appropriate  Motor:  Rollator  Speech/Language:   Clear and Coherent  Affect:  Appropriate  Mood:  dysthymic  Thought process:  tangential  Thought content:    WNL  Sensory/Perceptual disturbances:    WNL  Orientation:  Fully oriented  Attention:  Good    Concentration:  Fair  Memory:  WNL  Insight:    Variable  Judgment:   Good  Impulse Control:  Good   Risk Assessment: Danger to Self: No Self-injurious Behavior: No Danger to Others: No Physical Aggression / Violence: No Duty to Warn: No Access to Firearms a concern: No  Assessment of progress:  stabilized  Diagnosis:   ICD-10-CM   1. Major depressive disorder, recurrent, in partial remission (HCC)  F33.41     2. Adjustment disorder with mixed anxiety and depressed mood  F43.23     3. Multiple comorbid health conditions  R69     4. Housing or economic circumstances  Z59.9     5. Relationship problem between partners  Z63.0      Plan:  Living situation -- Endorse researching more reliable, better suited living space as noted, with Teresa Golden target to move.  As needed, research tenant's rights in the event landlady defaults on the mortgage.  For housekeeping, try to make a practice of smaller, more  frequent efforts and/or bringing in help as needed, with the mindset that it's all a "gift to the lady who lives here". Socialization and relationships -- Continue involvement in church and suitable groups as able.  Develop local friendships and "bonus" mother-daughter relationship at interest.  Offer to support groups such as CoDA or Al-Anon, and active elder facilities like [employer], [employer].  Endorse  option to work part-time if truly able and interested.  Endorse choice over dating, so long as she can keep her integrity, maintain the view that she is worth working for and being honest for, and does not have to make any desperate decisions to feel loved and lovable.  Lonely does not have to be desperate.   Self-esteem -- Per her faith, continue to practice self-esteem grounded in God's unconditional love.  Self-affirm that whatever she might deem "stupid", she has either learned, improved, or was doing the best she could figure out at the time, while trying to deal with strong and natural feelings, e.g., loneliness.  When tempted to feel like a failure for lack of family, or for acting quickly on desires, reframe self-image as a survivor who had to take on the harshest of losses and is legitimately tempted. General health -- Endorse any healthy activity, to include pool/gym if able and interested.  Address diabetes and renal self-care promptly and reliably.  Recommend taming light in late evening and after bed, with options to use soothing sound and orange lenses. Family -- Endorse boundary/limits with Teresa Golden.  Endorse "bonus" mother-daughter relationship. Other recommendations/advice -- As may be noted above.  Continue to utilize previously learned skills ad lib. Medication compliance -- Maintain medication as prescribed and work faithfully with relevant prescriber(s) if any changes are desired or seem indicated. Crisis service -- Aware of call list and work-in appts.  Call the clinic on-call service, 988/hotline, 911, or present to [facility] or ER if any life-threatening psychiatric crisis. Followup -- Return for time as already scheduled.  Next scheduled visit with me 03/17/2023.  Next scheduled in this office 03/17/2023.  Teresa Fries, PhD Teresa Czar, PhD LP Clinical Psychologist, [facility] [address] [place], [place]  [address] [phone]

## 2023-03-10 ENCOUNTER — Ambulatory Visit (INDEPENDENT_AMBULATORY_CARE_PROVIDER_SITE_OTHER): Payer: Medicare HMO | Admitting: Psychiatry

## 2023-03-10 ENCOUNTER — Telehealth: Payer: Self-pay | Admitting: Neurology

## 2023-03-10 DIAGNOSIS — F4323 Adjustment disorder with mixed anxiety and depressed mood: Secondary | ICD-10-CM | POA: Diagnosis not present

## 2023-03-10 DIAGNOSIS — F3341 Major depressive disorder, recurrent, in partial remission: Secondary | ICD-10-CM

## 2023-03-10 DIAGNOSIS — Z630 Problems in relationship with spouse or partner: Secondary | ICD-10-CM | POA: Diagnosis not present

## 2023-03-10 DIAGNOSIS — R69 Illness, unspecified: Secondary | ICD-10-CM | POA: Diagnosis not present

## 2023-03-10 DIAGNOSIS — Z599 Problem related to housing and economic circumstances, unspecified: Secondary | ICD-10-CM

## 2023-03-10 NOTE — Progress Notes (Signed)
 Psychotherapy Progress Note [facility] Jodie Kendall, PhD LP  [name] Elianys Conry)    MRN: [id number] Therapy format: Individual psychotherapy Date: 03/10/2023      Start: 4:21p     Stop: 5:06p     Time Spent: 42 min Location: Telehealth visit -- I connected with this patient by an approved telecommunication method (audio only), with her informed consent, and verifying identity and patient privacy.  I was located at my office and patient at her home.  As needed, we discussed the limitations, risks, and security and privacy concerns associated with telehealth service, including the availability and conditions which currently govern in-person appointments and the possibility that 3rd-party payment may not be fully guaranteed and she may be responsible for charges.  After she indicated understanding, we proceeded with the session.  Also discussed treatment planning, as needed, including ongoing verbal agreement with the plan, the opportunity to ask and answer all questions, her demonstrated understanding of instructions, and her readiness to call the office should symptoms worsen or she feels she is in a crisis state and needs more immediate and tangible assistance.   Session narrative (presenting needs, interim history, self-report of stressors and symptoms, applications of prior therapy, status changes, and interventions made in session) Scheduled short notice today, phone call.  Has had some continued contact with Bubba, did tell him she wanted to stop sex, but then let him stay a while, supposedly just watch TV in the bed, but he compulsively undressed for it.  As of today has it clearer from him that he felt she owed him sex for his trouble driving and his expense taking her out to dinner.  Thinking again she may have to end the relationship altogether, not just try to downgrade it.  Realized he can't speak honestly, he tells stories about other people to try to  make a point, and that he has habits of objectifying women he's known, and her.  After 4 years seeing better who he is, gearing up to cut him off, trying to figure out how (silence, block him, announce it).  Bias is to bury it, hope he just goes away, but encouraged her to imagine and practice at least replying if (when) he sniffs around again later.  Affirmed that she has been desperate enough before to take this deal, but not any more -- she has friends now, she has a God, and Exie is a leftover from when she was desperate.  Also affirmed that she will have mixed feelings, grieving this, too, but it's OK if she does.  Resolved that she will go to the Bible or a website come morning instead of reaching   Otherwise a hard day today with frustrating dental work.  Support/validation provided.   Therapeutic modalities: Cognitive Behavioral Therapy, Solution-Oriented/Positive Psychology, Ego-Supportive, and Assertiveness/Communication  Mental Status/Observations:  Appearance:   Not assessed     Behavior:  Appropriate  Motor:  Not assessed  Speech/Language:   Clear and Coherent  Affect:  Not assessed  Mood:  dysthymic  Thought process:  normal  Thought content:    WNL  Sensory/Perceptual disturbances:    WNL  Orientation:  Fully oriented  Attention:  Good    Concentration:  Fair  Memory:  WNL  Insight:    Variable  Judgment:   Good  Impulse Control:  Variable   Risk Assessment: Danger to Self: No Self-injurious Behavior: No Danger to Others: No Physical Aggression / Violence: No  Duty to Warn: No Access to Firearms a concern: No  Assessment of progress:  progressing  Diagnosis:   ICD-10-CM   1. Major depressive disorder, recurrent, in partial remission (HCC)  F33.41     2. Adjustment disorder with mixed anxiety and depressed mood  F43.23     3. Relationship problem between partners  Z63.0     4. Multiple comorbid health conditions  R69     5. Housing or economic circumstances   Z59.9      Plan:  Relationships -- Endorse cutting off dysfunctional relationships with men who use for sex, even if enjoyed, as they turn up empty.   Socialization and relationships -- Continue involvement in church and suitable groups as able.  Develop local friendships and bonus mother-daughter relationship at interest.  Offer to support groups such as CoDA or Al-Anon, and active elder facilities like [employer], [employer].  Endorse option to work part-time if truly able and interested.  Endorse choice over dating, so long as she can keep her integrity, maintain the view that she is worth working for and being honest for, and does not have to make any desperate decisions to feel loved and lovable.  Lonely does not have to be desperate.   Self-esteem -- Per her faith, continue to practice self-esteem grounded in God's unconditional love.  Self-affirm that whatever she might deem stupid, she has either learned, improved, or was doing the best she could figure out at the time, while trying to deal with strong and natural feelings, e.g., loneliness.  When tempted to feel like a failure for lack of family, or for acting quickly on desires, reframe self-image as a survivor who had to take on the harshest of losses and is legitimately tempted. General health -- Endorse any healthy activity, to include pool/gym if able and interested.  Address diabetes and renal self-care promptly and reliably.  Recommend taming light in late evening and after bed, with options to use soothing sound and orange lenses. Living situation -- Endorse researching more reliable, better suited living space as noted, with April target to move.  As needed, research tenant's rights in the event landlady defaults on the mortgage.  For housekeeping, try to make a practice of smaller, more frequent efforts and/or bringing in help as needed, with the mindset that it's all a gift to the lady who lives here. Family --  Endorse boundary/limits with Alfonso.  Endorse bonus mother-daughter relationship. Other recommendations/advice -- As may be noted above.  Continue to utilize previously learned skills ad lib. Medication compliance -- Maintain medication as prescribed and work faithfully with relevant prescriber(s) if any changes are desired or seem indicated. Crisis service -- Aware of call list and work-in appts.  Call the clinic on-call service, 988/hotline, 911, or present to [facility] or ER if any life-threatening psychiatric crisis. Followup -- Return for time as already scheduled.  Next scheduled visit with me 03/17/2023.  Next scheduled in this office 03/17/2023.  Lamar Kendall, PhD Jodie Kendall, PhD LP Clinical Psychologist, [facility] [address] [place], [place] [address] [phone]

## 2023-03-10 NOTE — Telephone Encounter (Signed)
 Pt states she was initially offered a VV for tomorrows appt and denied it but now would like to change. Appt changed to VV

## 2023-03-10 NOTE — Telephone Encounter (Signed)
 Pt called back to cx appts, states she will call back to rs

## 2023-03-11 ENCOUNTER — Telehealth: Payer: Medicare HMO | Admitting: Neurology

## 2023-03-17 ENCOUNTER — Ambulatory Visit (INDEPENDENT_AMBULATORY_CARE_PROVIDER_SITE_OTHER): Payer: Medicare HMO | Admitting: Psychiatry

## 2023-03-17 DIAGNOSIS — R69 Illness, unspecified: Secondary | ICD-10-CM | POA: Diagnosis not present

## 2023-03-17 DIAGNOSIS — F3340 Major depressive disorder, recurrent, in remission, unspecified: Secondary | ICD-10-CM

## 2023-03-17 DIAGNOSIS — F4323 Adjustment disorder with mixed anxiety and depressed mood: Secondary | ICD-10-CM | POA: Diagnosis not present

## 2023-03-17 DIAGNOSIS — Z630 Problems in relationship with spouse or partner: Secondary | ICD-10-CM

## 2023-03-17 NOTE — Progress Notes (Signed)
 Psychotherapy Progress Note [facility] [name], PhD LP  [name] [facility] "Teresa Golden")    MRN: [id number] Therapy format: Individual psychotherapy Date: 03/17/2023      Start: 8:14a     Stop: 9:14a     Time Spent: 60 min Location: In-person   Session narrative (presenting needs, interim history, self-report of stressors and symptoms, applications of prior therapy, status changes, and interventions made in session) Still off contact with Schneider, holding.  Admittedly was swayed by the sex for a long time.  Affirmed she is capable of self-satisfaction, and of forging friendships further.    Now concerned her kidneys are worse, given cramping, frequent bathroom trips.  Sounds like it could easily be about electrolytes, even though she typically drinks propel water.  Referred to PCP to assess, but current labs 2 months ago look like she would still be stage 3a CKD.  More likely she needs to rebalance electrolytes.  Recommend could try magnesium experimentally, or possible from lab trends that sodium or FBS have dipped below normal.  Considering getting a 3-wheeled bicycle now for short local trips, largely to reduce cost of getting rides.  Believes she can strengthen enough to use it, and be aware enough to be safe with it, though she was figuring to use it on the sidewalk.  Having orthopedic visit this week, will ask.  Allowed to consider -- most likely a nonstarter once she tet rides one.  Also thinking of fixing up her house more -- new couch, recliner, kitchen appliances.  Affirmed and encouraged, so long as logistics and expenses make sense.  Therapeutic modalities: Cognitive Behavioral Therapy, Solution-Oriented/Positive Psychology, and Ego-Supportive  Mental Status/Observations:  Appearance:   Casual     Behavior:  Appropriate  Motor:  Rollator  Speech/Language:   Clear and Coherent  Affect:  Appropriate  Mood:  normal  Thought process:  tangential   Thought content:    WNL  Sensory/Perceptual disturbances:    WNL  Orientation:  Fully oriented  Attention:  Good    Concentration:  Good  Memory:  WNL  Insight:    Variable  Judgment:   Variable  Impulse Control:  Good   Risk Assessment: Danger to Self: No Self-injurious Behavior: No Danger to Others: No Physical Aggression / Violence: No Duty to Warn: No Access to Firearms a concern: No  Assessment of progress:  stabilized  Diagnosis:   ICD-10-CM   1. MDD (recurrent major depressive disorder) in remission (HCC)  F33.40     2. Adjustment disorder with mixed anxiety and depressed mood  F43.23     3. Relationship problem between partners  Z63.0     4. Multiple comorbid health conditions  R69    incl suspicion of renal failure     Plan:  Relationships -- Endorse cutting off dysfunctional relationships with men who use for sex, even if enjoyed, as they turn up empty.   Socialization and relationships -- Continue involvement in church and suitable groups as able.  Develop local friendships and "bonus" mother-daughter relationship at interest.  Offer to support groups such as CoDA or Al-Anon, and active elder facilities like [employer], [employer].  Endorse option to work part-time if truly able and interested.  Endorse choice over dating, so long as she can keep her integrity, maintain the view that she is worth working for and being honest for, and does not have to make any desperate decisions to feel loved and lovable.  Lonely does not have to be desperate.   Self-esteem -- Per her faith, continue to practice self-esteem grounded in God's unconditional love.  Self-affirm that whatever she might deem "stupid", she has either learned, improved, or was doing the best she could figure out at the time, while trying to deal with strong and natural feelings, e.g., loneliness.  When tempted to feel like a failure for lack of family, or for acting quickly on desires, reframe  self-image as a survivor who had to take on the harshest of losses and is legitimately tempted. General health -- Endorse any healthy activity, to include pool/gym if able and interested.  Address diabetes and renal self-care promptly and reliably.  Recommend taming light in late evening and after bed, with options to use soothing sound and orange lenses. Living situation -- Endorse researching more reliable, better suited living space as noted, with April target to move.  As needed, research tenant's rights in the event landlady defaults on the mortgage.  For housekeeping, try to make a practice of smaller, more frequent efforts and/or bringing in help as needed, with the mindset that it's all a "gift to the lady who lives here". Family -- Endorse boundary/limits with [name].  Endorse "bonus" mother-daughter relationship. Other recommendations/advice -- As may be noted above.  Continue to utilize previously learned skills ad lib. Medication compliance -- Maintain medication as prescribed and work faithfully with relevant prescriber(s) if any changes are desired or seem indicated. Crisis service -- Aware of call list and work-in appts.  Call the clinic on-call service, 988/hotline, 911, or present to [facility] or ER if any life-threatening psychiatric crisis. Followup -- Return for time as already scheduled.  Next scheduled visit with me 03/31/2023.  Next scheduled in this office 03/31/2023.  Robley Fries, PhD [name], PhD LP Clinical Psychologist, [facility] [address] [place], [place] [address] [phone]

## 2023-03-19 ENCOUNTER — Ambulatory Visit: Payer: Self-pay

## 2023-03-19 NOTE — Patient Instructions (Signed)
Visit Information  Thank you for taking time to visit with me today. Please don't hesitate to contact me if I can be of assistance to you.   Following are the goals we discussed today:   Goals Addressed             This Visit's Progress    To improve kidney function   On track    Care Coordination Interventions: Inbound call received from patient with concerns that she may be in "renal failure" Reviewed and discussed with patient over a couple of days, she did not fill well and was experiencing muscle cramps in her gluteal region  Determined patient began to increase her water intake with electrolytes and this has resolved her feeling of unwellness  Reviewed and discussed with patient she continues to take [place] as directed  Educated patient about the importance of staying well hydrated with water aiming for a daily intake of 48-64 oz daily unless otherwise directed  Reviewed and discussed with patient her most recent kidney function labs are noted to be stable  Instructed patient to report any new symptoms or concerns to her PCP promptly and patient verbalizes understanding  Discussed plans with patient for ongoing care coordination follow up and provided patient with direct contact information for nurse care coordinator Last practice recorded BP readings:  BP Readings from Last 3 Encounters:  01/20/23 128/70  01/13/23 (!) 139/100  01/01/23 (!) 158/61   Most recent eGFR/CrCl:  Lab Results  Component Value Date   EGFR 44 (L) 11/26/2022    No components found for: "CRCL"            Our next appointment is by telephone on 04/13/23 at 10:30 AM  Please call the care guide team at [phone] if you need to cancel or reschedule your appointment.   If you are experiencing a Mental Health or Behavioral Health Crisis or need someone to talk to, please go to [facility] [address], [place] [phone])  Patient verbalizes  understanding of instructions and care plan provided today and agrees to view in MyChart. Active MyChart status and patient understanding of how to access instructions and care plan via MyChart confirmed with patient.     Delsa Sale RN BSN CCM [place]  [facility], [facility] Nurse Care Coordinator  Direct Dial: [phone] Website: [email or web address]

## 2023-03-19 NOTE — Patient Outreach (Signed)
  Care Coordination   Follow Up Visit Note   03/19/2023 Name: Teresa Golden MRN: [id number] DOB: 06-14-1945  Teresa Golden is a 78 y.o. year old female who sees Dorothyann Peng, MD for primary care. I spoke with  Teresa Golden by phone today.  What matters to the patients health and wellness today?  Patient would like to increase her daily water intake to help avoid dehydration and improve her kidney function.     Goals Addressed             This Visit's Progress    To improve kidney function   On track    Care Coordination Interventions: Inbound call received from patient with concerns that she may be in "renal failure" Reviewed and discussed with patient over a couple of days, she did not fill well and was experiencing muscle cramps in her gluteal region  Determined patient began to increase her water intake with electrolytes and this has resolved her feeling of unwellness  Reviewed and discussed with patient she continues to take [place] as directed  Educated patient about the importance of staying well hydrated with water aiming for a daily intake of 48-64 oz daily unless otherwise directed  Reviewed and discussed with patient her most recent kidney function labs are noted to be stable  Instructed patient to report any new symptoms or concerns to her PCP promptly and patient verbalizes understanding  Discussed plans with patient for ongoing care coordination follow up and provided patient with direct contact information for nurse care coordinator Last practice recorded BP readings:  BP Readings from Last 3 Encounters:  01/20/23 128/70  01/13/23 (!) 139/100  01/01/23 (!) 158/61   Most recent eGFR/CrCl:  Lab Results  Component Value Date   EGFR 44 (L) 11/26/2022    No components found for: "CRCL"     Interventions Today    Flowsheet Row Most Recent Value  Chronic Disease   Chronic disease during today's visit Chronic Kidney Disease/End Stage Renal Disease (ESRD),  Diabetes, Other  [hip pain]  General Interventions   General Interventions Discussed/Reviewed General Interventions Discussed, General Interventions Reviewed, Labs, Doctor Visits  Doctor Visits Discussed/Reviewed Doctor Visits Discussed, Doctor Visits Reviewed, PCP  Exercise Interventions   Exercise Discussed/Reviewed Exercise Reviewed, Exercise Discussed, Physical Activity  Physical Activity Discussed/Reviewed Physical Activity Discussed, Physical Activity Reviewed, Home Exercise Program (HEP)  Education Interventions   Education Provided Provided Education  Provided Verbal Education On Labs, Medication, Exercise, When to see the doctor, Nutrition  Labs Reviewed Kidney Function  Nutrition Interventions   Nutrition Discussed/Reviewed Nutrition Discussed, Nutrition Reviewed, Fluid intake  Pharmacy Interventions   Pharmacy Dicussed/Reviewed Pharmacy Topics Reviewed, Pharmacy Topics Discussed, Medications and their functions          SDOH assessments and interventions completed:  No     Care Coordination Interventions:  Yes, provided   Follow up plan: Follow up call scheduled for 04/13/23 @10 :30 AM    Encounter Outcome:  Patient Visit Completed

## 2023-03-20 ENCOUNTER — Ambulatory Visit (INDEPENDENT_AMBULATORY_CARE_PROVIDER_SITE_OTHER): Payer: Medicare HMO | Admitting: Podiatry

## 2023-03-20 ENCOUNTER — Encounter: Payer: Self-pay | Admitting: Podiatry

## 2023-03-20 DIAGNOSIS — M79674 Pain in right toe(s): Secondary | ICD-10-CM | POA: Diagnosis not present

## 2023-03-20 DIAGNOSIS — E1142 Type 2 diabetes mellitus with diabetic polyneuropathy: Secondary | ICD-10-CM

## 2023-03-20 DIAGNOSIS — B351 Tinea unguium: Secondary | ICD-10-CM | POA: Diagnosis not present

## 2023-03-20 DIAGNOSIS — M79675 Pain in left toe(s): Secondary | ICD-10-CM

## 2023-03-20 DIAGNOSIS — M2010 Hallux valgus (acquired), unspecified foot: Secondary | ICD-10-CM | POA: Insufficient documentation

## 2023-03-20 DIAGNOSIS — M2042 Other hammer toe(s) (acquired), left foot: Secondary | ICD-10-CM | POA: Diagnosis not present

## 2023-03-20 NOTE — Progress Notes (Signed)
This patient presents to the office with chief complaint of long thick nails and as well as painful second toe left foot.    This patient says there are long thick painful nails.  These nails are painful walking and wearing shoes.  Patient has no history of infection or drainage from both feet.  Patient is unable to  self treat his own nails . This patient presents  to the office today for treatment of the  long nails .  She is also concerned about the overlapping second toe left foot.  It is painful wearing certain shoes.  She requests an evaluation by a Careers adviser.    General Appearance  Alert, conversant and in no acute stress.  Vascular  Dorsalis pedis and posterior tibial  pulses are palpable  bilaterally.  Capillary return is within normal limits  bilaterally. Temperature is within normal limits  bilaterally.  Neurologic  Senn-Weinstein monofilament wire test diminished  bilaterally. Muscle power within normal limits bilaterally.  Nails Thick disfigured discolored nails with subungual debris  from hallux to fifth toes bilaterally. No evidence of bacterial infection or drainage bilaterally.  Orthopedic  No limitations of motion of motion feet .  No crepitus or effusions noted.  HAV  B/L.  Hammer toes  B/L.  Skin  normotropic skin with no porokeratosis noted bilaterally.  No signs of infections or ulcers noted.     Onychomycosis  Diabetes with no foot complications  HAV  B/L  Hammer toes  B/L.  Debride nails x 10.  Discussed fure care of her bunions and hammer toes.  Told her consult with Dr.  Allena Katz.    RTC 3 months.   Helane Gunther DPM

## 2023-03-25 ENCOUNTER — Encounter (HOSPITAL_COMMUNITY): Payer: Self-pay | Admitting: *Deleted

## 2023-03-31 ENCOUNTER — Ambulatory Visit (INDEPENDENT_AMBULATORY_CARE_PROVIDER_SITE_OTHER): Payer: Medicare HMO | Admitting: Psychiatry

## 2023-03-31 DIAGNOSIS — R69 Illness, unspecified: Secondary | ICD-10-CM

## 2023-03-31 DIAGNOSIS — Z630 Problems in relationship with spouse or partner: Secondary | ICD-10-CM | POA: Diagnosis not present

## 2023-03-31 DIAGNOSIS — F4323 Adjustment disorder with mixed anxiety and depressed mood: Secondary | ICD-10-CM

## 2023-03-31 DIAGNOSIS — F3341 Major depressive disorder, recurrent, in partial remission: Secondary | ICD-10-CM | POA: Diagnosis not present

## 2023-03-31 DIAGNOSIS — Z599 Problem related to housing and economic circumstances, unspecified: Secondary | ICD-10-CM

## 2023-03-31 NOTE — Progress Notes (Signed)
 Psychotherapy Progress Note [facility] Jodie Kendall, PhD LP  [name] [facility])    MRN: [id number] Therapy format: Individual psychotherapy Date: 03/31/2023      Start: 9:07a     Stop: 10:07a     Time Spent: 60 min Location: In-person   Session narrative (presenting needs, interim history, self-report of stressors and symptoms, applications of prior therapy, status changes, and interventions made in session) Doing well to resist calling Bubba.  Still tempted, but nearly a month now not cling him.  Credits redirecting herself to prayer, journaling, or calling friends, and remembering the hangover she gets when she does interact.  Affirmed and encouraged.  Thankful again for how it's worked out seeing a female therapist, and [place] at that.  Had initially thought it couldn't work, after female previously, but well pleased breaking through.  Affirmed and encouraged in her own work trying out trust that way.  Called Deward to ask about a restaurant he knows, and wound up talking about their relationship, or potential for it.  Hx of a particular, denigrating fight about sex before, and renewed discussion of what it means to each of them, including how routinely objectifying his perspective is.  Shared frankly in session about his sexual shortcomings and her appetites.  Discussed in session her hx of promiscuity long ago, and quandaries over masturbation.  Clarified values.  Now in PT for right hip problem.  Would like warm water  exercise.  Looked up available options in session, directed back to her physician or PT.  Allowed to extend session, given her need to acknowledge at some length hx of promiscuity and regret, and reaffirm availing herself of friends.  Affirmed and encouraged.  Therapeutic modalities: Cognitive Behavioral Therapy, Solution-Oriented/Positive Psychology, and Ego-Supportive  Mental Status/Observations:  Appearance:   Casual      Behavior:  Appropriate and Sharing  Motor:  Normal  Speech/Language:   Clear and Coherent  Affect:  Appropriate  Mood:  Appropriate to subject  Thought process:  normal  Thought content:    WNL  Sensory/Perceptual disturbances:    WNL  Orientation:  Fully oriented  Attention:  Good    Concentration:  Fair  Memory:  WNL  Insight:    Good  Judgment:   Good  Impulse Control:  Good   Risk Assessment: Danger to Self: No Self-injurious Behavior: No Danger to Others: No Physical Aggression / Violence: No Duty to Warn: No Access to Firearms a concern: No  Assessment of progress:  progressing  Diagnosis:   ICD-10-CM   1. Major depressive disorder, recurrent, in partial remission (HCC)  F33.41     2. Adjustment disorder with mixed anxiety and depressed mood  F43.23     3. Relationship problem between partners  Z63.0     4. Multiple comorbid health conditions  R69     5. Housing or economic circumstances  Z59.9      Plan:  Relationships -- Endorse cutting off dysfunctional relationships with men who use for sex, even if enjoyed, as they turn up empty.   Socialization and relationships -- Continue involvement in church and suitable groups as able.  Develop local friendships and bonus mother-daughter relationship at interest.  Offer to support groups such as Co[employer] or Al-Anon, and active elder facilities like [employer], [employer].  Endorse option to work part-time if truly able and interested.  Endorse choice over dating, so long as she can keep her integrity, maintain the  view that she is worth working for and being honest for, and does not have to make any desperate decisions to feel loved and lovable.  Lonely does not have to be desperate.   Self-esteem -- Per her faith, continue to practice self-esteem grounded in God's unconditional love.  Self-affirm that whatever she might deem stupid, she has either learned, improved, or was doing the best she could figure out  at the time, while trying to deal with strong and natural feelings, e.g., loneliness.  When tempted to feel like a failure for lack of family, or for acting quickly on desires, reframe self-image as a survivor who had to take on the harshest of losses and is legitimately tempted.  Acknowledge regrets when they come up, and reaffirm having learned from them. General health -- Endorse any healthy activity, to include pool/gym if able and interested.  Address diabetes and renal self-care promptly and reliably.  Recommend taming light in late evening and after bed, with options to use soothing sound and orange lenses. Living situation -- Endorse researching more reliable, better suited living space as noted, with April target to move.  As needed, research tenant's rights in the event landlady defaults on the mortgage.  For housekeeping, try to make a practice of smaller, more frequent efforts and/or bringing in help as needed, with the mindset that it's all a gift to the lady who lives here. Family -- Endorse boundary/limits with Alfonso.  Endorse bonus mother-daughter relationship. Other recommendations/advice -- As may be noted above.  Continue to utilize previously learned skills ad lib. Medication compliance -- Maintain medication as prescribed and work faithfully with relevant prescriber(s) if any changes are desired or seem indicated. Crisis service -- Aware of call list and work-in appts.  Call the clinic on-call service, 988/hotline, 911, or present to [facility] or ER if any life-threatening psychiatric crisis. Followup -- Return for time as already scheduled.  Next scheduled visit with me 04/14/2023.  Next scheduled in this office 04/14/2023.  Lamar Kendall, PhD Jodie Kendall, PhD LP Clinical Psychologist, [facility] [address], [address] [place], [place] [address] [phone]

## 2023-04-03 ENCOUNTER — Telehealth: Payer: Self-pay

## 2023-04-03 NOTE — Telephone Encounter (Signed)
 PAP: Patient assistance application for Farxiga  through [employer] ([employer]) has been mailed to pt's home address on file. Provider portion of application will be faxed to provider's office.  Please note prescription has ben sent in to [facility] pharmacy

## 2023-04-07 ENCOUNTER — Encounter: Payer: Self-pay | Admitting: Physician Assistant

## 2023-04-08 ENCOUNTER — Encounter: Payer: Self-pay | Admitting: Internal Medicine

## 2023-04-08 ENCOUNTER — Ambulatory Visit (INDEPENDENT_AMBULATORY_CARE_PROVIDER_SITE_OTHER): Payer: Medicare HMO | Admitting: Internal Medicine

## 2023-04-08 VITALS — BP 128/70 | HR 52 | Temp 98.1°F | Ht 65.0 in | Wt 202.2 lb

## 2023-04-08 DIAGNOSIS — N1831 Chronic kidney disease, stage 3a: Secondary | ICD-10-CM

## 2023-04-08 DIAGNOSIS — G44209 Tension-type headache, unspecified, not intractable: Secondary | ICD-10-CM | POA: Insufficient documentation

## 2023-04-08 DIAGNOSIS — E6609 Other obesity due to excess calories: Secondary | ICD-10-CM

## 2023-04-08 DIAGNOSIS — I129 Hypertensive chronic kidney disease with stage 1 through stage 4 chronic kidney disease, or unspecified chronic kidney disease: Secondary | ICD-10-CM | POA: Diagnosis not present

## 2023-04-08 DIAGNOSIS — Z794 Long term (current) use of insulin: Secondary | ICD-10-CM

## 2023-04-08 DIAGNOSIS — E66811 Obesity, class 1: Secondary | ICD-10-CM

## 2023-04-08 DIAGNOSIS — E1122 Type 2 diabetes mellitus with diabetic chronic kidney disease: Secondary | ICD-10-CM

## 2023-04-08 DIAGNOSIS — K7460 Unspecified cirrhosis of liver: Secondary | ICD-10-CM

## 2023-04-08 DIAGNOSIS — Z6833 Body mass index (BMI) 33.0-33.9, adult: Secondary | ICD-10-CM

## 2023-04-08 NOTE — Assessment & Plan Note (Signed)
She agrees to apply Biofreeze (she has at home), to both shoulders and neck nightly. She will let me know if sx persist.

## 2023-04-08 NOTE — Progress Notes (Signed)
I,[name], CMA,acting as a Neurosurgeon for [name], MD.,have documented all relevant documentation on the behalf of [name], MD,as directed by  [name], MD while in the presence of [name], MD.  Subjective:  [name] , female    DOB: 1945-12-29 , 78 y.o.   MRN: [id number]  Chief Complaint  Patient presents with   Diabetes   Hypertension    HPI  Patient presents today for a diabetes and BP check. She reports compliance with meds. She denies having any headaches, chest pain and shortness of breath.   She states her sugars are typically in the 90s-100s in the mornings.  She has no specific concerns or complaints at this time.     Diabetes She presents for her follow-up diabetic visit. She has type 2 diabetes mellitus. Hypoglycemia symptoms include headaches. Pertinent negatives for diabetes include no blurred vision. There are no hypoglycemic complications. Risk factors for coronary artery disease include diabetes mellitus, dyslipidemia, hypertension, obesity, sedentary lifestyle and post-menopausal. She is following a diabetic diet. She participates in exercise intermittently. Her home blood glucose trend is fluctuating minimally. Her breakfast blood glucose is taken between 8-9 am. Her breakfast blood glucose range is generally 110-130 mg/dl. An ACE inhibitor/angiotensin II receptor blocker is being taken. Eye exam is current.  Hypertension This is a chronic problem. The current episode started more than 1 year ago. The problem has been gradually improving since onset. The problem is uncontrolled. Associated symptoms include headaches. Pertinent negatives include no blurred vision. Risk factors for coronary artery disease include diabetes mellitus, dyslipidemia, post-menopausal state and sedentary lifestyle. Past treatments include angiotensin blockers and beta blockers. The current treatment provides moderate improvement.     Past Medical  History:  Diagnosis Date   Arthritis    Chronic kidney disease    self reports ckd stage 3    Depression    Diabetes mellitus, type II, insulin dependent (HCC)    With neurologic complications. 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    Hypertensive nephropathy 07/05/2018   Hypothyroidism    Hypothyroidism 02/06/2018   Morbid obesity with BMI of 50.0-59.9, adult (HCC)    Scoliosis      Family History  Problem Relation Age of Onset   Heart attack Mother    Heart failure Mother    Stroke Father      Current Outpatient Medications:    allopurinol (ZYLOPRIM) 100 MG tablet, Take 1 tablet (100 mg total) by mouth daily., Disp: 90 tablet, Rfl: 2   amLODipine (NORVASC) 5 MG tablet, Take 1 tablet by mouth once daily, Disp: 90 tablet, Rfl: 0   AZO-CRANBERRY PO, Take 1 tablet by mouth daily., Disp: , Rfl:    B Complex-C (B-COMPLEX WITH VITAMIN C) tablet, Take 1 tablet by mouth 3 (three) times a week., Disp: , Rfl:    betamethasone dipropionate 0.05 % cream, Apply topically 2 (two) times daily., Disp: 30 g, Rfl: 0   Biotin 28413 MCG TABS, Take 10,000 mcg by mouth daily., Disp: , Rfl:    cetirizine (ZYRTEC) 10 MG tablet, Take 1 tablet (10 mg total) by mouth daily., Disp: 30 tablet, Rfl: 1  Cholecalciferol 5000 units TABS, Take 5,000 Units by mouth daily., Disp: , Rfl:    colchicine 0.6 MG tablet, Take 1 tablet by mouth for gout attack., Disp: 30 tablet, Rfl: 1   dapagliflozin propanediol (FARXIGA) 10 MG TABS tablet, Take 1 tablet (10 mg total) by mouth daily., Disp: 90 tablet, Rfl: 3   diclofenac Sodium (VOLTAREN) 1 % GEL, APPLY 2 GRAMS TO AFFECTED AREA(S) 4 TIMES DAILY AS NEEDED (Patient taking differently: Apply 2-4 g topically every 6 (six) hours as  needed (to affected areas).), Disp: 200 g, Rfl: 2   gabapentin (NEURONTIN) 300 MG capsule, Take 2 capsules (600 mg total) by mouth at bedtime., Disp: 180 capsule, Rfl: 2   glucose blood (ONETOUCH VERIO) test strip, USE TO CHECK BLOOD SUGAR   TWO TIMES A DAY AS         INSTRUCTED, Disp: 200 strip, Rfl: 3   Insulin Degludec (TRESIBA) 100 UNIT/ML SOLN, Inject 10 Units/oz/day into the skin at bedtime. Patient is taking at bedtime, Disp: , Rfl:    Lancets (ONETOUCH DELICA PLUS LANCET33G) MISC, USE TO CHECK BLOOD SUGARS  TWO TIMES A DAY AS         INSTRUCTED, Disp: 200 each, Rfl: 3   levothyroxine (SYNTHROID) 25 MCG tablet, Take 1/2 tablet daily., Disp: 90 tablet, Rfl: 1   losartan (COZAAR) 100 MG tablet, Take 1 tablet (100 mg total) by mouth daily., Disp: 90 tablet, Rfl: 2   methylPREDNISolone (MEDROL DOSEPAK) 4 MG TBPK tablet, Day 1: 8mg  before breakfast, 4 mg after lunch, 4 mg after supper, and 8 mg at bedtime Day 2: 4 mg before breakfast, 4 mg after lunch, 4 mg  after supper, and 8 mg  at bedtime Day 3:  4 mg  before breakfast, 4 mg  after lunch, 4 mg after supper, and 4 mg  at bedtime Day 4: 4 mg  before breakfast, 4 mg  after lunch, and 4 mg at bedtime Day 5: 4 mg  before breakfast and 4 mg at bedtime Day 6: 4 mg  before breakfast, Disp: 1 each, Rfl: 0   metoprolol succinate (TOPROL-XL) 25 MG 24 hr tablet, TAKE 1 TABLET AT BEDTIME   (DISCONTINUE COREG), Disp: 90 tablet, Rfl: 1   Multiple Vitamins-Minerals (MULTIVITAMIN WITH MINERALS) tablet, Take 1 tablet by mouth daily. Women's 50+, Disp: , Rfl:    pantoprazole (PROTONIX) 40 MG tablet, Take 40 mg by mouth daily as needed., Disp: , Rfl:    Semaglutide, 1 MG/DOSE, (OZEMPIC, 1 MG/DOSE,) 4 MG/3ML SOPN, Inject 1 mg into the skin once a week., Disp: 9 mL, Rfl: 1   simvastatin (ZOCOR) 10 MG tablet, Take 1 tablet (10 mg total) by mouth daily., Disp: 90 tablet, Rfl: 0   triamcinolone ointment (KENALOG) 0.1 %, Apply 1 Application topically 2 (two) times daily.,  Disp: , Rfl:    Allergies  Allergen Reactions   Meloxicam Other (See Comments)    Fever; muscle aches; "flu-like" symptoms "Allergic," per [facility]   Other Other (See Comments)    "Rose fever and hay fever"     Review of Systems  Constitutional: Negative.   Eyes:  Negative for blurred vision.  Respiratory: Negative.    Cardiovascular: Negative.   Gastrointestinal: Negative.   Neurological:  Positive for headaches.       She complains of shooting pains on the left side in the back of her head. She also admits to having some associated neck pain. Denies having any UE weakness/paresthesias.  Sx  usually occur at night when she is going to bed. She is unaware of any other triggers.   Psychiatric/Behavioral: Negative.       Today's Vitals   04/08/23 1100  BP: 128/70  Pulse: (!) 52  Temp: 98.1 F (36.7 C)  SpO2: 98%  Weight: 202 lb 3.2 oz (91.7 kg)  Height: 5\' 5"  (1.651 m)   Body mass index is 33.65 kg/m.  Wt Readings from Last 3 Encounters:  04/08/23 202 lb 3.2 oz (91.7 kg)  01/20/23 205 lb (93 kg)  01/01/23 205 lb 12.8 oz (93.4 kg)     Objective:  Physical Exam Vitals and nursing note reviewed.  Constitutional:      Appearance: Normal appearance.  HENT:     Head: Normocephalic and atraumatic.  Eyes:     Extraocular Movements: Extraocular movements intact.  Neck:     Comments: Tight trapezius mm bilaterally Cardiovascular:     Rate and Rhythm: Normal rate and regular rhythm.     Heart sounds: Normal heart sounds.  Pulmonary:     Effort: Pulmonary effort is normal.     Breath sounds: Normal breath sounds.  Musculoskeletal:     Comments: Ambulatory with walker  Skin:    General: Skin is warm.  Neurological:     General: No focal deficit present.     Mental Status: She is alert.  Psychiatric:        Mood and Affect: Mood normal.        Behavior: Behavior normal.         Assessment And Plan:  Type 2 diabetes mellitus with stage 3a chronic kidney disease, with  long-term current use of insulin (HCC) Assessment & Plan: Chronic, she is currently taking Farxiga 10mg  and semaglutide 1mg  weekly. Importance of dietary/medication compliance was discussed with the patient in detail. I will adjust meds as needed after reviewing labs listed below.  She will f/u in three to four months.  Regarding her CKD, she agrees to renal ultrasound. She has been referred to Nephrology by nurse practitioner.    Orders: -     CBC -     CMP14+EGFR -     Hemoglobin A1c -     PTH, intact and calcium -     Phosphorus -     Protein electrophoresis, serum -     US RENAL; Future  Hypertensive nephropathy Assessment & Plan: Chronic, controlled.  She will continue with metoprolol succinate XL 25mg  daily at bedtime and losartan 100mg  daily. She is encouraged to follow low sodium diet.   Orders: -     CMP14+EGFR  Tension-type headache, not intractable, unspecified chronicity pattern Assessment & Plan: She agrees to apply Biofreeze (she has at home), to both shoulders and neck nightly. She will let me know if sx persist.     Cirrhosis of liver without ascites, unspecified hepatic cirrhosis type [facility]) Assessment & Plan: Chronic, has h/o cirrhosis secondary to hepatitis C. She has been treated for this by Hepatology at [facility].  She is aware that she should abstain from alcohol.    Class 1 obesity due to excess calories with serious comorbidity and body mass index (BMI) of 33.0 to 33.9 in adult Assessment & Plan: She is encouraged to strive for BMI less than 30 to decrease cardiac risk. Advised to aim for at least 150 minutes of exercise per week.    She is encouraged to strive for BMI less than 30 to decrease cardiac risk. Advised to aim  for at least 150 minutes of exercise per week.    Return if symptoms worsen or fail to improve.  Patient was given opportunity to ask questions. Patient verbalized understanding of the plan and was able to repeat key elements of the plan.  All questions were answered to their satisfaction.    I, [name], MD, have reviewed all documentation for this visit. The documentation on 04/08/23 for the exam, diagnosis, procedures, and orders are all accurate and complete.   IF YOU HAVE BEEN REFERRED TO A SPECIALIST, IT MAY TAKE 1-2 WEEKS TO SCHEDULE/PROCESS THE REFERRAL. IF YOU HAVE NOT HEARD FROM US/SPECIALIST IN TWO WEEKS, PLEASE GIVE [place] A CALL AT [phone] X [phone].   THE PATIENT IS ENCOURAGED TO PRACTICE SOCIAL DISTANCING DUE TO THE COVID-19 PANDEMIC.

## 2023-04-08 NOTE — Assessment & Plan Note (Signed)
Chronic, controlled.  She will continue with metoprolol succinate XL 25mg  daily at bedtime and losartan 100mg  daily. She is encouraged to follow low sodium diet.

## 2023-04-08 NOTE — Assessment & Plan Note (Signed)
She is encouraged to strive for BMI less than 30 to decrease cardiac risk. Advised to aim for at least 150 minutes of exercise per week.

## 2023-04-08 NOTE — Assessment & Plan Note (Signed)
Chronic, has h/o cirrhosis secondary to hepatitis C. She has been treated for this by Hepatology at [facility].  She is aware that she should abstain from alcohol.

## 2023-04-08 NOTE — Assessment & Plan Note (Addendum)
Chronic, she is currently taking Farxiga 10mg  and semaglutide 1mg  weekly. Importance of dietary/medication compliance was discussed with the patient in detail. I will adjust meds as needed after reviewing labs listed below.  She will f/u in three to four months.  Regarding her CKD, she agrees to renal ultrasound. She has been referred to Nephrology by nurse practitioner.

## 2023-04-08 NOTE — Patient Instructions (Signed)

## 2023-04-09 NOTE — Telephone Encounter (Signed)
PAP: Application for Marcelline Deist has been submitted to [employer] ([employer]), via fax

## 2023-04-10 ENCOUNTER — Other Ambulatory Visit: Payer: Medicare HMO

## 2023-04-10 LAB — PHOSPHORUS: Phosphorus: 4.2 mg/dL (ref 3.0–4.3)

## 2023-04-10 LAB — PROTEIN ELECTROPHORESIS, SERUM
A/G Ratio: 1.1 (ref 0.7–1.7)
Albumin ELP: 3.6 g/dL (ref 2.9–4.4)
Alpha 1: 0.2 g/dL (ref 0.0–0.4)
Alpha 2: 0.9 g/dL (ref 0.4–1.0)
Beta: 1.1 g/dL (ref 0.7–1.3)
Gamma Globulin: 1.2 g/dL (ref 0.4–1.8)
Globulin, Total: 3.3 g/dL (ref 2.2–3.9)
M-Spike, %: 0.3 g/dL — ABNORMAL HIGH

## 2023-04-10 LAB — CMP14+EGFR
ALT: 15 [IU]/L (ref 0–32)
AST: 22 [IU]/L (ref 0–40)
Albumin: 3.9 g/dL (ref 3.8–4.8)
Alkaline Phosphatase: 93 [IU]/L (ref 44–121)
BUN/Creatinine Ratio: 15 (ref 12–28)
BUN: 21 mg/dL (ref 8–27)
Bilirubin Total: 0.3 mg/dL (ref 0.0–1.2)
CO2: 24 mmol/L (ref 20–29)
Calcium: 9.3 mg/dL (ref 8.7–10.3)
Chloride: 106 mmol/L (ref 96–106)
Creatinine, Ser: 1.41 mg/dL — ABNORMAL HIGH (ref 0.57–1.00)
Globulin, Total: 3 g/dL (ref 1.5–4.5)
Glucose: 103 mg/dL — ABNORMAL HIGH (ref 70–99)
Potassium: 4.5 mmol/L (ref 3.5–5.2)
Sodium: 143 mmol/L (ref 134–144)
Total Protein: 6.9 g/dL (ref 6.0–8.5)
eGFR: 38 mL/min/{1.73_m2} — ABNORMAL LOW (ref >59)

## 2023-04-10 LAB — HEMOGLOBIN A1C
Est. average glucose Bld gHb Est-mCnc: 140 mg/dL
Hgb A1c MFr Bld: 6.5 % — ABNORMAL HIGH (ref 4.8–5.6)

## 2023-04-10 LAB — CBC
Hematocrit: 37.9 % (ref 34.0–46.6)
Hemoglobin: 12.8 g/dL (ref 11.1–15.9)
MCH: 27.5 pg (ref 26.6–33.0)
MCHC: 33.8 g/dL (ref 31.5–35.7)
MCV: 82 fL (ref 79–97)
Platelets: 229 10*3/uL (ref 150–450)
RBC: 4.65 x10E6/uL (ref 3.77–5.28)
RDW: 14.9 % (ref 11.7–15.4)
WBC: 7 10*3/uL (ref 3.4–10.8)

## 2023-04-10 LAB — PTH, INTACT AND CALCIUM: PTH: 57 pg/mL (ref 15–65)

## 2023-04-13 ENCOUNTER — Ambulatory Visit: Payer: Self-pay

## 2023-04-13 NOTE — Progress Notes (Signed)
 Pharmacy Medication Assistance Program Note    04/13/2023  [name], female   DOB: 10/17/1945, 78 y.o.   MRN: [id number]     02/09/2023 04/03/2023  Outreach Medication One  Initial Outreach Date (Medication One)  04/03/2023  Manufacturer Medication One [employer] [facility] Nurse, adult Drugs  Farxiga  Dose of Farxiga  10mg   [facility] Ozempic   Dose of Ozempic 1MG /WEEK   Type of Forensic scientist Assistance  Name of Prescriber Dorothyann Peng   Date Application Received From Patient  04/09/2023  Application Items Received From Patient  Application  Date Application Received From Provider 02/12/2023 04/09/2023  Date Application Submitted to Manufacturer 02/12/2023 04/09/2023  Method Application Sent to Manufacturer Fax   Patient Assistance Determination Approved Approved  Approval Start Date 02/25/2023 02/25/2023  Approval End Date 02/24/2024 02/24/2024  Patient Notification Method MyChart Telephone Call  Telephone Call Outcome  Left Voicemail     Signature [name], CPHT/Patient Advocate [place] Direct Line: [phone] Fax: [phone]

## 2023-04-13 NOTE — Patient Outreach (Signed)
  Care Coordination   Follow Up Visit Note   04/13/2023 Name: Teresa Golden MRN: [id number] DOB: 27-Aug-1945  BRADY PLANT is a 78 y.o. year old female who sees Dorothyann Peng, MD for primary care. I spoke with  Margrett Rud by phone today.  What matters to the patients health and wellness today?  Patient would like to work on increasing her daily water intake to help improve her kidney function.     Goals Addressed             This Visit's Progress    To improve kidney function   On track    Care Coordination Interventions: Assessed the Patient understanding of chronic kidney disease    Evaluation of current treatment plan related to chronic kidney disease self management and patient's adherence to plan as established by provider      Reviewed prescribed diet increase daily water intake to 48-64 oz daily unless otherwise directed  Provided education on kidney disease progression     Reviewed and discussed upcoming scheduled renal US per PCP referral set for 04/15/23 @1 :00 PM Discussed plans with patient for ongoing care coordination follow up and provided patient with direct contact information for nurse care coordinator Last practice recorded BP readings:  BP Readings from Last 3 Encounters:  04/08/23 128/70  01/20/23 128/70  01/13/23 (!) 139/100   Most recent eGFR/CrCl:  Lab Results  Component Value Date   EGFR 38 (L) 04/08/2023    No components found for: "CRCL"     Interventions Today    Flowsheet Row Most Recent Value  Chronic Disease   Chronic disease during today's visit Diabetes, Chronic Kidney Disease/End Stage Renal Disease (ESRD), Other  [Bradycardia]  General Interventions   General Interventions Discussed/Reviewed General Interventions Discussed, General Interventions Reviewed, Doctor Visits, Labs, Durable Medical Equipment (DME)  Doctor Visits Discussed/Reviewed Doctor Visits Discussed, Doctor Visits Reviewed, PCP  Durable Medical Equipment (DME) BP Cuff   Education Interventions   Education Provided Provided Education  Provided Verbal Education On Medication, Labs, When to see the doctor  Labs Reviewed Hgb A1c, Kidney Function  Pharmacy Interventions   Pharmacy Dicussed/Reviewed Pharmacy Topics Discussed, Pharmacy Topics Reviewed, Medications and their functions, Medication Adherence          SDOH assessments and interventions completed:  No     Care Coordination Interventions:  Yes, provided   Follow up plan: Follow up call scheduled for 04/21/23 @10 :00 AM    Encounter Outcome:  Patient Visit Completed

## 2023-04-13 NOTE — Patient Instructions (Signed)
 Visit Information  Thank you for taking time to visit with me today. Please don't hesitate to contact me if I can be of assistance to you.   Following are the goals we discussed today:   Goals Addressed             This Visit's Progress    To improve kidney function   On track    Care Coordination Interventions: Assessed the Patient understanding of chronic kidney disease    Evaluation of current treatment plan related to chronic kidney disease self management and patient's adherence to plan as established by provider      Reviewed prescribed diet increase daily water intake to 48-64 oz daily unless otherwise directed  Provided education on kidney disease progression     Reviewed and discussed upcoming scheduled renal US per PCP referral set for 04/15/23 @1 :00 PM Discussed plans with patient for ongoing care coordination follow up and provided patient with direct contact information for nurse care coordinator Last practice recorded BP readings:  BP Readings from Last 3 Encounters:  04/08/23 128/70  01/20/23 128/70  01/13/23 (!) 139/100   Most recent eGFR/CrCl:  Lab Results  Component Value Date   EGFR 38 (L) 04/08/2023    No components found for: "CRCL"         Our next appointment is by telephone on 04/21/23 at 10:00 AM  Please call the care guide team at [phone] if you need to cancel or reschedule your appointment.   If you are experiencing a Mental Health or Behavioral Health Crisis or need someone to talk to, please call 1-800-273-TALK (toll free, 24 hour hotline)  Patient verbalizes understanding of instructions and care plan provided today and agrees to view in MyChart. Active MyChart status and patient understanding of how to access instructions and care plan via MyChart confirmed with patient.     Delsa Sale RN BSN CCM [place]  [facility], [facility] Nurse Care Coordinator  Direct Dial: [phone] Website:  [email or web address]

## 2023-04-13 NOTE — Telephone Encounter (Signed)
 PAP: Patient assistance application for Marcelline Deist has been approved by PAP Companies: [employer] from 02/25/2023 to 02/24/2024. Medication should be delivered to PAP Delivery: Home. For further shipping updates, please contact [employer] ([employer]) at [phone]. Patient ID is: [id number]

## 2023-04-14 ENCOUNTER — Ambulatory Visit (INDEPENDENT_AMBULATORY_CARE_PROVIDER_SITE_OTHER): Payer: Medicare HMO | Admitting: Psychiatry

## 2023-04-14 DIAGNOSIS — F4323 Adjustment disorder with mixed anxiety and depressed mood: Secondary | ICD-10-CM

## 2023-04-14 DIAGNOSIS — R69 Illness, unspecified: Secondary | ICD-10-CM | POA: Diagnosis not present

## 2023-04-14 DIAGNOSIS — Z599 Problem related to housing and economic circumstances, unspecified: Secondary | ICD-10-CM

## 2023-04-14 DIAGNOSIS — Z630 Problems in relationship with spouse or partner: Secondary | ICD-10-CM | POA: Diagnosis not present

## 2023-04-14 DIAGNOSIS — F3341 Major depressive disorder, recurrent, in partial remission: Secondary | ICD-10-CM

## 2023-04-14 NOTE — Progress Notes (Signed)
 Psychotherapy Progress Note [facility] [name], PhD LP  [name] [facility] "[name]")    MRN: [id number] Therapy format: Individual psychotherapy Date: 04/14/2023      Start: 9:12a     Stop: 10:00a     Time Spent: 48 min Location: In-person   Session narrative (presenting needs, interim history, self-report of stressors and symptoms, applications of prior therapy, status changes, and interventions made in session) Chandra Batch sent a [place] card, read it and trashed it.  Maintaining her resolve, and finding it easier than expected to live without him.  Testifies to the value of surrendering to God instead.  Looking to start an 8-week study group at church on emotionally healthy relationships.  Will need to pre-nap those days, since her habit is to start winding down by 7.  Finding friendship in [place], who also knows what it feels like with an emotionally unavailable mother.  Finding friend Marcelino Duster more unreliable.  The question of moving apartments continues, still motivated to move, partly for a slow water heater, unwilling to change the tub to a shower, and poor energy efficiency with heating.  Has appt to look at a new place early March, seeking 1st floor, handicapped accessible.  Considering part-time work to help afford moving costs.  Affirmed and encouraged.  New medical hx -- has a gastric sleeve.  Continues in PT for hip.  Several doctor visits coming up, including bunion eval.    Re regret hx, still pains her to think back on how she did younger, what trouble she got herself in, for not knowing better.  Reveals her mother was a sociopath who stole credit cards and forged names, and would go missing in the late evening.  Hard, lonely upbringing.  Reaffirmed that she is not her mother, even if tempted now and then to break some rules.    Therapeutic modalities: Cognitive Behavioral Therapy, Solution-Oriented/Positive Psychology, and Ego-Supportive  Mental  Status/Observations:  Appearance:   Casual     Behavior:  Appropriate  Motor:  Rollator  Speech/Language:   Clear and Coherent  Affect:  Appropriate  Mood:  normal  Thought process:  normal  Thought content:    WNL  Sensory/Perceptual disturbances:    WNL  Orientation:  Fully oriented  Attention:  Good    Concentration:  Fair  Memory:  WNL  Insight:    Fair  Judgment:   Good  Impulse Control:  Good   Risk Assessment: Danger to Self: No Self-injurious Behavior: No Danger to Others: No Physical Aggression / Violence: No Duty to Warn: No Access to Firearms a concern: No  Assessment of progress:  progressing  Diagnosis:   ICD-10-CM   1. Major depressive disorder, recurrent, in partial remission (HCC)  F33.41     2. Adjustment disorder with mixed anxiety and depressed mood  F43.23     3. Relationship problem between partners  Z63.0     4. Multiple comorbid health conditions  R69     5. Housing or economic circumstances  Z59.9      Plan:  Relationships -- Endorse cutting off dysfunctional relationships with men who use for sex, even if enjoyed, as they turn up empty.   Socialization and relationships -- Continue involvement in church and suitable groups as able.  Develop local friendships and "bonus" mother-daughter relationship at interest.  Offer to support groups such as Co[employer] or Al-Anon, and active elder facilities like [employer], [employer].  Endorse option to  work part-time if truly able and interested.  Endorse choice over dating, so long as she can keep her integrity, maintain the view that she is worth working for and being honest for, and does not have to make any desperate decisions to feel loved and lovable.  Lonely does not have to be desperate.   Self-esteem -- Per her faith, continue to practice self-esteem grounded in God's unconditional love.  Self-affirm that whatever she might deem "stupid", she has either learned, improved, or was doing the best  she could figure out at the time, while trying to deal with strong and natural feelings, e.g., loneliness.  When tempted to feel like a failure for lack of family, or for acting quickly on desires, reframe self-image as a survivor who had to take on the harshest of losses and is legitimately tempted.  Acknowledge regrets when they come up, and reaffirm having learned from them. General health -- Endorse any healthy activity, to include pool/gym if able and interested.  Address diabetes and renal self-care promptly and reliably.  Recommend taming light in late evening and after bed, with options to use soothing sound and orange lenses. Living situation -- Endorse researching more reliable, better suited living space as noted, with April target to move.  As needed, research tenant's rights in the event landlady defaults on the mortgage.  For housekeeping, try to make a practice of smaller, more frequent efforts and/or bringing in help as needed, with the mindset that it's all a "gift to the lady who lives here". Family -- Endorse boundary/limits with [name].  Endorse "bonus" mother-daughter relationship. Other recommendations/advice -- As may be noted above.  Continue to utilize previously learned skills ad lib. Medication compliance -- Maintain medication as prescribed and work faithfully with relevant prescriber(s) if any changes are desired or seem indicated. Crisis service -- Aware of call list and work-in appts.  Call the clinic on-call service, 988/hotline, 911, or present to [facility] or ER if any life-threatening psychiatric crisis. Followup -- Return for time as already scheduled.  Next scheduled visit with me 05/01/2023.  Next scheduled in this office 05/01/2023.  Robley Fries, PhD [name], PhD LP Clinical Psychologist, [facility] [address], [address] [place], [place] [address] [phone]

## 2023-04-15 ENCOUNTER — Ambulatory Visit
Admission: RE | Admit: 2023-04-15 | Discharge: 2023-04-15 | Discharge status: Home / Self Care | Payer: Medicare HMO | Source: Ambulatory Visit | Attending: Internal Medicine | Admitting: Internal Medicine

## 2023-04-15 ENCOUNTER — Ambulatory Visit: Payer: Medicare HMO | Admitting: Podiatry

## 2023-04-15 ENCOUNTER — Other Ambulatory Visit: Payer: Medicare HMO

## 2023-04-15 DIAGNOSIS — N1831 Chronic kidney disease, stage 3a: Secondary | ICD-10-CM

## 2023-04-17 ENCOUNTER — Ambulatory Visit: Payer: Medicare HMO | Admitting: Podiatry

## 2023-04-17 ENCOUNTER — Ambulatory Visit: Payer: Medicare HMO

## 2023-04-17 DIAGNOSIS — M2042 Other hammer toe(s) (acquired), left foot: Secondary | ICD-10-CM | POA: Diagnosis not present

## 2023-04-17 DIAGNOSIS — M2010 Hallux valgus (acquired), unspecified foot: Secondary | ICD-10-CM

## 2023-04-17 DIAGNOSIS — M19072 Primary osteoarthritis, left ankle and foot: Secondary | ICD-10-CM

## 2023-04-17 DIAGNOSIS — M2012 Hallux valgus (acquired), left foot: Secondary | ICD-10-CM | POA: Diagnosis not present

## 2023-04-17 NOTE — Progress Notes (Signed)
 Subjective:  [name], female    DOB: 1945-10-09,  MRN: [id number]  Chief Complaint  Patient presents with   Bunions    Wants to talk about surgery left foot     78 y.o. female presents with the above complaint.  Patient presents with left first metatarsophalangeal joint arthritis that has been painful for quite some time.  She has been treated conservatively for long time she also has hammertoe contracture of second digit.  She would like to discuss treatment options and surgical options for this.  She has failed conservative care under Dr. Stacie Acres.  She denies any other acute issues pain scale 7 out of 10 wants to discuss surgery   Review of Systems: Negative except as noted in the HPI. Denies N/V/F/Ch.  Past Medical History:  Diagnosis Date   Arthritis    Chronic kidney disease    self reports ckd stage 3    Depression    Diabetes mellitus, type II, insulin dependent (HCC)    With neurologic complications. 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    Hypertensive nephropathy 07/05/2018   Hypothyroidism    Hypothyroidism 02/06/2018   Morbid obesity with BMI of 50.0-59.9, adult (HCC)    Scoliosis     Current Outpatient Medications:    allopurinol (ZYLOPRIM) 100 MG tablet, Take 1 tablet (100 mg total) by mouth daily., Disp: 90 tablet, Rfl: 2   amLODipine (NORVASC) 5 MG tablet, Take 1 tablet by mouth once daily, Disp: 90 tablet, Rfl: 0   AZO-CRANBERRY PO, Take 1 tablet by mouth daily., Disp: , Rfl:    B Complex-C (B-COMPLEX WITH VITAMIN C) tablet, Take 1 tablet by mouth 3 (three) times a week., Disp: , Rfl:    betamethasone dipropionate 0.05 % cream, Apply topically 2 (two) times daily., Disp: 30 g, Rfl: 0   Biotin 40981 MCG  TABS, Take 10,000 mcg by mouth daily., Disp: , Rfl:    cetirizine (ZYRTEC) 10 MG tablet, Take 1 tablet (10 mg total) by mouth daily., Disp: 30 tablet, Rfl: 1   Cholecalciferol 5000 units TABS, Take 5,000 Units by mouth daily., Disp: , Rfl:    colchicine 0.6 MG tablet, Take 1 tablet by mouth for gout attack., Disp: 30 tablet, Rfl: 1   dapagliflozin propanediol (FARXIGA) 10 MG TABS tablet, Take 1 tablet (10 mg total) by mouth daily., Disp: 90 tablet, Rfl: 3   diclofenac Sodium (VOLTAREN) 1 % GEL, APPLY 2 GRAMS TO AFFECTED AREA(S) 4 TIMES DAILY AS NEEDED (Patient taking differently: Apply 2-4 g topically every 6 (six) hours as needed (to affected areas).), Disp: 200 g, Rfl: 2   gabapentin (NEURONTIN) 300 MG capsule, Take 2 capsules (600 mg total) by mouth at bedtime., Disp: 180 capsule, Rfl: 2   glucose blood (ONETOUCH VERIO) test strip, USE TO CHECK BLOOD SUGAR   TWO TIMES A DAY AS         INSTRUCTED, Disp: 200 strip, Rfl: 3   Insulin Degludec (TRESIBA) 100 UNIT/ML SOLN, Inject 10 Units/oz/day into the skin at bedtime. Patient is taking at bedtime, Disp: , Rfl:    Lancets (ONETOUCH DELICA PLUS LANCET33G) MISC, USE TO CHECK BLOOD SUGARS  TWO TIMES A DAY AS  INSTRUCTED, Disp: 200 each, Rfl: 3   levothyroxine (SYNTHROID) 25 MCG tablet, Take 1/2 tablet daily., Disp: 90 tablet, Rfl: 1   losartan (COZAAR) 100 MG tablet, Take 1 tablet (100 mg total) by mouth daily., Disp: 90 tablet, Rfl: 2   methylPREDNISolone (MEDROL DOSEPAK) 4 MG TBPK tablet, Day 1: 8mg  before breakfast, 4 mg after lunch, 4 mg after supper, and 8 mg at bedtime Day 2: 4 mg before breakfast, 4 mg after lunch, 4 mg  after supper, and 8 mg  at bedtime Day 3:  4 mg  before breakfast, 4 mg  after lunch, 4 mg after supper, and 4 mg  at bedtime Day 4: 4 mg  before breakfast, 4 mg  after lunch, and 4 mg at bedtime Day 5: 4 mg  before breakfast and 4 mg at bedtime Day 6: 4 mg  before breakfast, Disp: 1 each, Rfl: 0   metoprolol succinate  (TOPROL-XL) 25 MG 24 hr tablet, TAKE 1 TABLET AT BEDTIME   (DISCONTINUE COREG), Disp: 90 tablet, Rfl: 1   Multiple Vitamins-Minerals (MULTIVITAMIN WITH MINERALS) tablet, Take 1 tablet by mouth daily. Women's 50+, Disp: , Rfl:    pantoprazole (PROTONIX) 40 MG tablet, Take 40 mg by mouth daily as needed., Disp: , Rfl:    Semaglutide, 1 MG/DOSE, (OZEMPIC, 1 MG/DOSE,) 4 MG/3ML SOPN, Inject 1 mg into the skin once a week., Disp: 9 mL, Rfl: 1   simvastatin (ZOCOR) 10 MG tablet, Take 1 tablet (10 mg total) by mouth daily., Disp: 90 tablet, Rfl: 0   triamcinolone ointment (KENALOG) 0.1 %, Apply 1 Application topically 2 (two) times daily., Disp: , Rfl:   Social History   Tobacco Use  Smoking Status Former   Current packs/day: 0.25   Average packs/day: 0.3 packs/day for 10.0 years (2.5 ttl pk-yrs)   Types: Cigarettes  Smokeless Tobacco Never  Tobacco Comments   quit 20 years ago    Allergies  Allergen Reactions   Meloxicam Other (See Comments)    Fever; muscle aches; "flu-like" symptoms "Allergic," per [facility]   Other Other (See Comments)    "Rose fever and hay fever"   Objective:  There were no vitals filed for this visit. There is no height or weight on file to calculate BMI. Constitutional Well developed. Well nourished.  Vascular Dorsalis pedis pulses palpable bilaterally. Posterior tibial pulses palpable bilaterally. Capillary refill normal to all digits.  No cyanosis or clubbing noted. Pedal hair growth normal.  Neurologic Normal speech. Oriented to person, place, and time. Epicritic sensation to light touch grossly present bilaterally.  Dermatologic Nails well groomed and normal in appearance. No open wounds. No skin lesions.  Orthopedic: Pain on palpation left first metatarsophalangeal joint pain with range of motion of the joint deep intra-articular pain noted arthritis noted with limited range of motion of the first MPJ joint.  Hammertoe contracture semiflexible in nature  noted to second and third digit   Radiographs: 3 views of skeletally mature adult left foot: Severe osteoarthritis noted to the first metatarsophalangeal joint hammertoe contracture of second and third digit noted some vessel calcification noted midfoot arthritis noted pes planovalgus foot structure noted no other bony abnormalities identified Assessment:   1. Acquired hallux valgus, unspecified laterality   2. Hammertoe of left foot   3. Arthritis of first metatarsophalangeal (MTP) joint of left foot    Plan:  Patient was evaluated and treated and all questions answered.  Left first MPJ arthritis with second and third digit hammertoe contracture -  All questions and concerns were discussed with the patient in extensive detail given that she has failed all conservative care she will benefit from surgical fusion/arthrodesis of first MPJ with hammertoe arthroplasty of second and third digit with fixation.  I discussed my preoperative intra postoperative plan with the patient in extensive detail she states understanding would like to proceed with surgery  No follow-ups on file.   Left first MPJ artthrodesis with second hammertoe/ 3rd hammertoe surgery  W

## 2023-04-20 ENCOUNTER — Telehealth: Payer: Self-pay | Admitting: Urology

## 2023-04-20 NOTE — Telephone Encounter (Signed)
 LM for pt to call back when she is ready to schedule sx with Dr. Allena Katz.

## 2023-04-21 ENCOUNTER — Ambulatory Visit: Payer: Self-pay

## 2023-04-21 NOTE — Patient Outreach (Signed)
 Care Coordination   04/21/2023 Name: Teresa Golden MRN: [id number] DOB: [birth year]   Care Coordination Outreach Attempts:  An unsuccessful outreach was attempted for an appointment today.  Follow Up Plan:  Additional outreach attempts will be made to offer the patient complex care management information and services.   Encounter Outcome:  Patient Request to Call Back   Care Coordination Interventions:  No, not indicated    Delsa Sale RN BSN CCM [place]  [facility], [facility] Nurse Care Coordinator  Direct Dial: [phone] Website: [email or web address]

## 2023-04-22 ENCOUNTER — Ambulatory Visit: Payer: Self-pay

## 2023-04-22 DIAGNOSIS — E1169 Type 2 diabetes mellitus with other specified complication: Secondary | ICD-10-CM

## 2023-04-22 DIAGNOSIS — I10 Essential (primary) hypertension: Secondary | ICD-10-CM

## 2023-04-22 NOTE — Patient Instructions (Signed)
 Visit Information  Thank you for taking time to visit with me today. Please don't hesitate to contact me if I can be of assistance to you.   Following are the goals we discussed today:   Goals Addressed             This Visit's Progress    To improve kidney function   On track    Care Coordination Interventions: Assessed the Patient understanding of chronic kidney disease    Evaluation of current treatment plan related to chronic kidney disease self management and patient's adherence to plan as established by provider      Reviewed prescribed diet increase daily water intake to 48-64 oz daily unless otherwise directed  Provided education on kidney disease progression     Discussed patient completed her renal US as directed, she has not received her results as of yet Reviewed and discussed PCP referral to Nephrology sent on 01/29/24, patient states she has yet to receive a call from [facility], she will contact this provider today to schedule an initial visit  Discussed plans with patient for ongoing care coordination follow up and provided patient with direct contact information for nurse care coordinator  Last practice recorded BP readings:  BP Readings from Last 3 Encounters:  04/08/23 128/70  01/20/23 128/70  01/13/23 (!) 139/100   Most recent eGFR/CrCl:  Lab Results  Component Value Date   EGFR 38 (L) 04/08/2023    No components found for: "CRCL"      To undergo foot surgery without complications       Care Coordination Interventions: Evaluation of current treatment plan related to Left first MPJ arthrodesis with second hammertoe/ 3rd hammertoe surgery and patient's adherence to plan as established by provider Reviewed and discussed with patient she is scheduled for outpatient surgery at [facility] in [place] on 07/06/23 for left first MPJ arthrodesis with second hammertoe/3rd hammertoe surgery  Determined patient has a friend from church who will  accompany her to the procedure and be available to assist her as needed at home following the procedure  Reviewed and discussed patient's upcoming scheduled follow up with PCP for surgical clearance scheduled for 06/16/22 @2 :40 PM Discussed plans with patient for ongoing care coordination follow up and provided patient with direct contact information for nurse care coordinator         Our next appointment is by telephone on 06/18/23 at 10:30 AM  Please call the care guide team at [phone] if you need to cancel or reschedule your appointment.   If you are experiencing a Mental Health or Behavioral Health Crisis or need someone to talk to, please call 1-800-273-TALK (toll free, 24 hour hotline)  Patient verbalizes understanding of instructions and care plan provided today and agrees to view in MyChart. Active MyChart status and patient understanding of how to access instructions and care plan via MyChart confirmed with patient.     Delsa Sale RN BSN CCM [place]  [facility], [facility] Nurse Care Coordinator  Direct Dial: [phone] Website: [email or web address]

## 2023-04-22 NOTE — Patient Outreach (Signed)
 Care Coordination   Follow Up Visit Note   04/22/2023 Name: Teresa Golden MRN: [id number] DOB: 1945/03/08  Teresa Golden is a 78 y.o. year old female who sees Dorothyann Peng, MD for primary care. I spoke with  Margrett Rud by phone today.  What matters to the patients health and wellness today?  Patient would like to work in increasing her daily water intake. Patient would like to undergo foot surgery without complications.     Goals Addressed             This Visit's Progress    To improve kidney function   On track    Care Coordination Interventions: Assessed the Patient understanding of chronic kidney disease    Evaluation of current treatment plan related to chronic kidney disease self management and patient's adherence to plan as established by provider      Reviewed prescribed diet increase daily water intake to 48-64 oz daily unless otherwise directed  Provided education on kidney disease progression     Discussed patient completed her renal US as directed, she has not received her results as of yet Reviewed and discussed PCP referral to Nephrology sent on 01/29/24, patient states she has yet to receive a call from [facility], she will contact this provider today to schedule an initial visit  Discussed plans with patient for ongoing care coordination follow up and provided patient with direct contact information for nurse care coordinator  Last practice recorded BP readings:  BP Readings from Last 3 Encounters:  04/08/23 128/70  01/20/23 128/70  01/13/23 (!) 139/100   Most recent eGFR/CrCl:  Lab Results  Component Value Date   EGFR 38 (L) 04/08/2023    No components found for: "CRCL"      To undergo foot surgery without complications       Care Coordination Interventions: Evaluation of current treatment plan related to Left first MPJ arthrodesis with second hammertoe/ 3rd hammertoe surgery and patient's adherence to plan as established by provider Reviewed and  discussed with patient she is scheduled for outpatient surgery at the [facility] in [place] on 07/06/23 for left first MPJ arthrodesis with second hammertoe/3rd hammertoe surgery  Determined patient has a friend from church who will accompany her to the procedure and be available to assist her as needed at home following the procedure  Reviewed and discussed patient's upcoming scheduled follow up with PCP for surgical clearance scheduled for 06/16/22 @2 :40 PM Discussed plans with patient for ongoing care coordination follow up and provided patient with direct contact information for nurse care coordinator     Interventions Today    Flowsheet Row Most Recent Value  Chronic Disease   Chronic disease during today's visit Chronic Kidney Disease/End Stage Renal Disease (ESRD), Hypertension (HTN), Other  General Interventions   General Interventions Discussed/Reviewed General Interventions Discussed, General Interventions Reviewed, Doctor Visits, Labs, Durable Medical Equipment (DME), Communication with  Doctor Visits Discussed/Reviewed Doctor Visits Discussed, Doctor Visits Reviewed, PCP, Specialist  Durable Medical Equipment (DME) BP Cuff  Communication with Social Work  Remigio Eisenmenger BSW]  Education Interventions   Education Provided Provided Education  Provided Verbal Education On When to see the doctor, Labs, Medication  Pharmacy Interventions   Pharmacy Dicussed/Reviewed Pharmacy Topics Discussed, Pharmacy Topics Reviewed, Medication Adherence, Medications and their functions          SDOH assessments and interventions completed:  Yes  SDOH Interventions Today    Flowsheet Row Most Recent Value  SDOH  Interventions   Food Insecurity Interventions Intervention Not Indicated  Housing Interventions Intervention Not Indicated  Utilities Interventions AMB Referral        Care Coordination Interventions:  No, not indicated   Follow up plan: Follow up call scheduled  for 06/18/23 @10 :30 AM    Encounter Outcome:  Patient Visit Completed

## 2023-04-23 ENCOUNTER — Telehealth: Payer: Self-pay | Admitting: *Deleted

## 2023-04-23 NOTE — Progress Notes (Signed)
 Complex Care Management Care Guide Note  04/23/2023 Name: ZION LINT MRN: [id number] DOB: August 02, 1945  MICHAELE AMUNDSON is a 78 y.o. year old female who is a primary care patient of Dorothyann Peng, MD and is actively engaged with the care management team. I reached out to Margrett Rud by phone today to assist with scheduling  with the BSW.  Follow up plan: Unsuccessful telephone outreach attempt made. A HIPAA compliant phone message was left for the patient providing contact information and requesting a return call.  Gwenevere Ghazi  [facility]  [facility], [facility] Guide  Direct Dial: [phone]  Fax [phone]

## 2023-04-24 ENCOUNTER — Ambulatory Visit (INDEPENDENT_AMBULATORY_CARE_PROVIDER_SITE_OTHER): Payer: Medicare HMO | Admitting: Psychiatry

## 2023-04-24 DIAGNOSIS — F4323 Adjustment disorder with mixed anxiety and depressed mood: Secondary | ICD-10-CM

## 2023-04-24 DIAGNOSIS — N1832 Chronic kidney disease, stage 3b: Secondary | ICD-10-CM

## 2023-04-24 DIAGNOSIS — E1122 Type 2 diabetes mellitus with diabetic chronic kidney disease: Secondary | ICD-10-CM

## 2023-04-24 DIAGNOSIS — Z794 Long term (current) use of insulin: Secondary | ICD-10-CM

## 2023-04-24 DIAGNOSIS — F3341 Major depressive disorder, recurrent, in partial remission: Secondary | ICD-10-CM

## 2023-04-24 DIAGNOSIS — R69 Illness, unspecified: Secondary | ICD-10-CM

## 2023-04-24 DIAGNOSIS — F3340 Major depressive disorder, recurrent, in remission, unspecified: Secondary | ICD-10-CM

## 2023-04-24 DIAGNOSIS — Z630 Problems in relationship with spouse or partner: Secondary | ICD-10-CM

## 2023-04-24 NOTE — Progress Notes (Unsigned)
 Psychotherapy Progress Note [facility] [name], PhD LP  [name] [facility] "[name]")    MRN: [id number] Therapy format: Individual psychotherapy Date: 04/24/2023      Start: 10:09a     Stop: 11:00a     Time Spent: 51 min Location: Telehealth visit -- I connected with this patient by an approved telecommunication method (audio only), with her informed consent, and verifying identity and patient privacy.  I was located at my office and patient at her home.  As needed, we discussed the limitations, risks, and security and privacy concerns associated with telehealth service, including the availability and conditions which currently govern in-person appointments and the possibility that 3rd-party payment may not be fully guaranteed and she may be responsible for charges.  After she indicated understanding, we proceeded with the session.  Also discussed treatment planning, as needed, including ongoing verbal agreement with the plan, the opportunity to ask and answer all questions, her demonstrated understanding of instructions, and her readiness to call the office should symptoms worsen or she feels she is in a crisis state and needs more immediate and tangible assistance.   Session narrative (presenting needs, interim history, self-report of stressors and symptoms, applications of prior therapy, status changes, and interventions made in session) Pushed up appointment in response to a challenging week.  Getting in with kidney specialist next week, when she would have been here.  78th birthday this week.    Reveals its been a "f-ed up week", largely because she called [name], when he didn't call her for her birthday, and it turned angry.  Will be having outpatient surgery on her foot, has lined up personal support and a ride, [name] offered to come "hold your hand", and it felt patronizing enough to trigger her to let out all her dissatisfaction with how he's treated her as  just a side piece.  Been feeling guilty for laying into him, but validated here that she probably needed the chance to assert herself and needed to trick herself into setting up the opportunity.  (Joked that it's an alternate version of [name] -- "I just called ... to say ... I hate you.")  Validated the need to say what's wrong, and worth brekaing up with entirely, even if it comes to triggering his pride and getting an earful back, and it was unmistakably strong of her to do that, not something she has to be ashamed of.  Siince then, has planned a burger date with friend [name] (not a boundaries risk, closer to an adopted son type).  Broaches a new concern -- near daily drinking.  Wondering if she might need detox but immediately reluctant to consider hospital, adamant no AA.  Persuaded to see them as entirely separate issues and let's screen for medical risk before anything else.  Most days, 3-4 mixed drinks spread out in time.  On a heavy day, may go through a whole bottle of wine, or champagne, still spread out.  Worst of days will take to the bed, eat and drink.  No blackouts, not passing out, but stage 3b kidney disease and recent lab results are raising an alarm to take better care of herself.  There are days she goes without drinking, and when she visits here, she typically has coffee and/or tea but no alcohol.  Denies any urgent feelings of having to get home to alcohol, and when she does plan to make a drink, she does not have compulsive feelings.  No DTs or withdrawal sxs to note.  Concluded she does not need detox, on any level, but it is certainly smart of her to change her habit, for the sake of diabetes and kidney dz.  Affirmed taking the chance to disclose -- including again revealing to a [place] man -- and reframed it as part of what's right with her, vs her common fear that things are wrong with her.  Practical suggestions to try cutting cranberry juice with nonalcoholic fluids (water, club  soda, e.g.) and to dedicate to not buy the next bottle of alcohol  Scared of going on dialysis, tearful momentarily.  Recent test results eGFR in the 30s prompted it.  Discussed kidney care.  Knows she is supposed to hydrate better but hates plain water.  Has advice to hydrate better, and lab results showing some increase in phosphorus and persistent mid-6s A1C, along with intermittent borderline anemia.  Tangential about exercise and her hx of bariatric surgery, exercise habits, and a couple of irritating women she encountered at the gym pool ... allowed to vent.    Relates hx how she got told she would be just like her mother,   Current intentions to do some housekeeping, clear some unwanted clothing.   Therapeutic modalities: {AM:23362::"Cognitive Behavioral Therapy","Solution-Oriented/Positive Psychology"}  Mental Status/Observations:  Appearance:   {PSY:22683}     Behavior:  {PSY:21022743}  Motor:  {PSY:22302}  Speech/Language:   {PSY:22685}  Affect:  {PSY:22687}  Mood:  {PSY:31886}  Thought process:  {PSY:31888}  Thought content:    {[phone]}  Sensory/Perceptual disturbances:    {PSY:[phone]}  Orientation:  {Psych Orientation:23301::"Fully oriented"}  Attention:  {Good-Fair-Poor ratings:23770::"Good"}    Concentration:  {Good-Fair-Poor ratings:23770::"Good"}  Memory:  {PSY:779-428-2313}  Insight:    {Good-Fair-Poor ratings:23770::"Good"}  Judgment:   {Good-Fair-Poor ratings:23770::"Good"}  Impulse Control:  {Good-Fair-Poor ratings:23770::"Good"}   Risk Assessment: Danger to Self: {Risk:22599::"No"} Self-injurious Behavior: {Risk:22599::"No"} Danger to Others: {Risk:22599::"No"} Physical Aggression / Violence: {Risk:22599::"No"} Duty to Warn: {AMYesNo:22526::"No"} Access to Firearms a concern: {AMYesNo:22526::"No"}  Assessment of progress:  {Progress:22147::"progressing"}  Diagnosis:   ICD-10-CM   1. Major depressive disorder, recurrent, in partial remission (HCC)   F33.41     2. Adjustment disorder with mixed anxiety and depressed mood  F43.23     3. Relationship problem between partners  Z63.0     4. Type 2 diabetes mellitus with stage 3b chronic kidney disease, with long-term current use of insulin (HCC)  E11.22    N18.32    Z79.4     5. Multiple comorbid health conditions  R69      Plan:  *** Other recommendations/advice -- As may be noted above.  Continue to utilize previously learned skills ad lib. Medication compliance -- Maintain medication as prescribed and work faithfully with relevant prescriber(s) if any changes are desired or seem indicated. Crisis service -- Aware of call list and work-in appts.  Call the clinic on-call service, 988/hotline, 911, or present to [facility] or ER if any life-threatening psychiatric crisis. Followup -- Return for time as already scheduled, avail earlier @ PT's need.  Next scheduled visit with me 05/13/2023.  Next scheduled in this office 05/13/2023.  Robley Fries, PhD [name], PhD LP Clinical Psychologist, [facility] [address] [place], [place] [address] [phone]

## 2023-04-24 NOTE — Progress Notes (Signed)
 Complex Care Management Care Guide Note  04/24/2023 Name: [name] MRN: [id number] DOB: 26-Nov-1945  Teresa Golden is a 78 y.o. year old female who is a primary care patient of Dorothyann Peng, MD and is actively engaged with the care management team. I reached out to Margrett Rud by phone today to assist with re-scheduling  with the BSW.  Follow up plan: Unsuccessful telephone outreach attempt made. A HIPAA compliant phone message was left for the patient providing contact information and requesting a return call.  Gwenevere Ghazi  [facility] Health  Value-Based Care Institute, [facility] Guide  Direct Dial: [phone]  Fax [phone]

## 2023-04-25 ENCOUNTER — Other Ambulatory Visit: Payer: Self-pay | Admitting: Internal Medicine

## 2023-04-27 NOTE — Progress Notes (Signed)
 Complex Care Management Note  Care Guide Note 04/27/2023 Name: Teresa Golden MRN: [id number] DOB: 10-19-1945  Teresa Golden is a 78 y.o. year old female who sees Dorothyann Peng, MD for primary care. I reached out to Margrett Rud by phone today to offer complex care management services.  Ms. Christman was given information about Complex Care Management services today including:   The Complex Care Management services include support from the care team which includes your Nurse Care Manager, Clinical Social Worker, or Pharmacist.  The Complex Care Management team is here to help remove barriers to the health concerns and goals most important to you. Complex Care Management services are voluntary, and the patient may decline or stop services at any time by request to their care team member.   Complex Care Management Consent Status: Patient agreed to services and verbal consent obtained.   Follow up plan:  Telephone appointment with complex care management team member scheduled for:  3/17  Encounter Outcome:  Patient Scheduled  Gwenevere Ghazi  [facility] Health  [facility], [facility] Guide  Direct Dial: [phone]  Fax [phone]

## 2023-05-01 ENCOUNTER — Ambulatory Visit: Payer: Medicare HMO | Admitting: Psychiatry

## 2023-05-08 ENCOUNTER — Other Ambulatory Visit: Payer: Self-pay | Admitting: Internal Medicine

## 2023-05-11 ENCOUNTER — Ambulatory Visit: Payer: Self-pay | Admitting: Licensed Clinical Social Worker

## 2023-05-11 ENCOUNTER — Encounter: Payer: Self-pay | Admitting: Nephrology

## 2023-05-11 LAB — LAB REPORT - SCANNED
Albumin, Urine POC: 25.7
Creatinine, POC: 120.8 mg/dL
EGFR: 46
Microalb Creat Ratio: 21

## 2023-05-11 NOTE — Patient Outreach (Signed)
 Care Coordination   Initial Visit Note   05/11/2023 Name: Teresa Golden MRN: [id number] DOB: Jun 23, 1945  Teresa Golden is a 78 y.o. year old female who sees Dorothyann Peng, MD for primary care. I spoke with  Margrett Rud by phone today.  What matters to the patients health and wellness today?  Housing and Utility assistance    Goals Addressed             This Visit's Progress    Care Coordination Activities       Care Coordination Interventions: Patient stated that she does not need help with her utilities because she was able to work out a payment arrangement with her utilities.  Patient stated that she is wanting a list of affordable housing, she is already on the [facility] list, but when she calls the apartments are not vacant or not built yet. Sw will mail a updated housing list. SW completed the SDOH screening questions and no other needs Patient requested to speak with the RN on her case, SW sent a message to [employer], Charity fundraiser. Sw also reminded the patient that she has an upcoming appointment on 06/18/2023 at 10:30 am with RS Little . SW will follow up with patient on 05/25/2023 at 10:00 am         SDOH assessments and interventions completed:  Yes  SDOH Interventions Today    Flowsheet Row Most Recent Value  SDOH Interventions   Food Insecurity Interventions Intervention Not Indicated  Housing Interventions Community Resources Provided  [Needs to move because the landlord has not been paying the mortage and the patient has been paying her rent on time.]  Transportation Interventions Intervention Not Indicated  [Patient has [employer], Lyft or Uber]  Utilities Interventions Intervention Not Indicated        Care Coordination Interventions:  Yes, provided  Interventions Today    Flowsheet Row Most Recent Value  General Interventions   General Interventions Discussed/Reviewed General Interventions Discussed  [SW will mail the affordable housing list]        Follow up plan:  Follow up call scheduled for 05/25/2023 at 10:00 am     Encounter Outcome:  Patient Visit Completed   [name], PhD [facility], [facility] Social Worker Direct Dial: [phone]  Fax: [phone]

## 2023-05-11 NOTE — Patient Instructions (Signed)
 Visit Information  Thank you for taking time to visit with me today. Please don't hesitate to contact me if I can be of assistance to you.   Following are the goals we discussed today:   Goals Addressed             This Visit's Progress    Care Coordination Activities       Care Coordination Interventions: Patient stated that she does not need help with her utilities because she was able to work out a payment arrangement with her utilities.  Patient stated that she is wanting a list of affordable housing, she is already on the [facility] list, but when she calls the apartments are not vacant or not built yet. Sw will mail a updated housing list. SW completed the SDOH screening questions and no other needs Patient requested to speak with the RN on her case, SW sent a message to [employer], Charity fundraiser. Sw also reminded the patient that she has an upcoming appointment on 06/18/2023 at 10:30 am with RS Little . SW will follow up with patient on 05/25/2023 at 10:00 am         Our next appointment is by telephone on 05/25/2023 at 10:00 am  Please call the care guide team at [phone] if you need to cancel or reschedule your appointment.   If you are experiencing a Mental Health or Behavioral Health Crisis or need someone to talk to, please call the Suicide and Crisis Lifeline: 988 go to [facility] Urgent [facility] [address] [place] [phone]) call 911  Patient verbalizes understanding of instructions and care plan provided today and agrees to view in MyChart. Active MyChart status and patient understanding of how to access instructions and care plan via MyChart confirmed with patient.     [name], PhD [facility], [facility] Social Worker Direct Dial: [phone]  Fax: [phone]

## 2023-05-12 ENCOUNTER — Telehealth: Payer: Self-pay | Admitting: *Deleted

## 2023-05-12 NOTE — Progress Notes (Signed)
 Complex Care Management Care Guide Note  05/12/2023 Name: Teresa Golden MRN: [id number] DOB: 1946/01/24  Teresa Golden is a 78 y.o. year old female who is a primary care patient of Dorothyann Peng, MD and is actively engaged with the care management team. I reached out to Margrett Rud by phone today to assist with scheduling  with the RN Case Manager.  Follow up plan: Telephone appointment with complex care management team member scheduled for:  3/19  Gwenevere Ghazi  [facility] Health  Value-Based Care Institute, [facility] Guide  Direct Dial: [phone]  Fax [phone]

## 2023-05-13 ENCOUNTER — Ambulatory Visit: Payer: Self-pay

## 2023-05-13 ENCOUNTER — Ambulatory Visit (INDEPENDENT_AMBULATORY_CARE_PROVIDER_SITE_OTHER): Payer: Medicare HMO | Admitting: Psychiatry

## 2023-05-13 DIAGNOSIS — Z794 Long term (current) use of insulin: Secondary | ICD-10-CM

## 2023-05-13 DIAGNOSIS — E1122 Type 2 diabetes mellitus with diabetic chronic kidney disease: Secondary | ICD-10-CM | POA: Diagnosis not present

## 2023-05-13 DIAGNOSIS — Z599 Problem related to housing and economic circumstances, unspecified: Secondary | ICD-10-CM

## 2023-05-13 DIAGNOSIS — R69 Illness, unspecified: Secondary | ICD-10-CM | POA: Diagnosis not present

## 2023-05-13 DIAGNOSIS — F4323 Adjustment disorder with mixed anxiety and depressed mood: Secondary | ICD-10-CM | POA: Diagnosis not present

## 2023-05-13 DIAGNOSIS — F3340 Major depressive disorder, recurrent, in remission, unspecified: Secondary | ICD-10-CM

## 2023-05-13 DIAGNOSIS — N1832 Chronic kidney disease, stage 3b: Secondary | ICD-10-CM

## 2023-05-13 NOTE — Patient Outreach (Signed)
 Care Coordination   Follow Up Visit Note   05/13/2023 Name: Teresa Golden MRN: [id number] DOB: January 25, 1946  Teresa Golden is a 78 y.o. year old female who sees Dorothyann Peng, MD for primary care. I spoke with  Margrett Rud by phone today.  What matters to the patients health and wellness today?  Patient would like to establish with Hematology for evaluation of abnormal lab result.     Goals Addressed             This Visit's Progress    To improve kidney function   On track    Care Coordination Interventions: Assessed the Patient understanding of chronic kidney disease    Evaluation of current treatment plan related to chronic kidney disease self management and patient's adherence to plan as established by provider      Reviewed and discussed patient's recent follow up with [facility] Review of patient status, including review of consultant's reports, relevant laboratory and other test results Discussed patient's GFR has increased to 46. Reviewed and discussed with patient, MD referral to Hematology to evaluate for potential MGUS due to abnormal blood counts Reviewed and discussed patient's urine showed UTI, she is taking the prescribed antibiotic, educated patient on the importance of completing full course of medicine  Confirmed patient has increased her daily water intake to 48-64 oz daily Active listening / Reflection utilized  Emotional Support Provided  Discussed plans with patient for ongoing care coordination follow up and provided patient with direct contact information for nurse care coordinator  Last practice recorded BP readings:  BP Readings from Last 3 Encounters:  04/08/23 128/70  01/20/23 128/70  01/13/23 (!) 139/100   Most recent eGFR/CrCl:  Lab Results  Component Value Date   EGFR 46 (L) 05/11/2023    No components found for: "CRCL"      To undergo foot surgery without complications   On track    Care Coordination Interventions: Evaluation of current  treatment plan related to Left first MPJ arthrodesis with second hammertoe/ 3rd hammertoe surgery and patient's adherence to plan as established by provider Reviewed and discussed with patient she is scheduled for outpatient surgery at the [facility] in [place] on 07/06/23 for left first MPJ arthrodesis with second hammertoe/3rd hammertoe surgery  Answered patient questions related to risk factors for healing from foot sugery Educated patient regarding the risk for delayed healing secondary to having diabetes Positive reinforcement provided to patient for lowering her A1c and making attempts to better manage her diabetes Encouraged patient to further discuss any/all concerns she may have related to potential risk factors with her Podiatrist prior to surgery Reviewed and discussed patient's upcoming scheduled follow up with PCP for surgical clearance scheduled for 06/16/22 @2 :40 PM Discussed plans with patient for ongoing care coordination follow up and provided patient with direct contact information for nurse care coordinator Scheduled nurse follow call with patient for 06/18/23 @10 :30 AM     Interventions Today    Flowsheet Row Most Recent Value  Chronic Disease   Chronic disease during today's visit Chronic Kidney Disease/End Stage Renal Disease (ESRD), Other  [referral to hematology to evaluate for possible MGUS,  Hammer Toe]  General Interventions   General Interventions Discussed/Reviewed General Interventions Discussed, General Interventions Reviewed, Doctor Visits, Labs  Doctor Visits Discussed/Reviewed Doctor Visits Discussed, Doctor Visits Reviewed, PCP, Specialist  Education Interventions   Education Provided Provided Education  Provided Verbal Education On Labs, When to see the doctor  Labs  Reviewed Kidney Function          SDOH assessments and interventions completed:  No     Care Coordination Interventions:  Yes, provided   Follow up plan: Follow up call  scheduled for 06/18/23 @10 :30 AM    Encounter Outcome:  Patient Visit Completed

## 2023-05-13 NOTE — Patient Instructions (Signed)
 Visit Information  Thank you for taking time to visit with me today. Please don't hesitate to contact me if I can be of assistance to you.   Following are the goals we discussed today:   Goals Addressed             This Visit's Progress    To improve kidney function   On track    Care Coordination Interventions: Assessed the Patient understanding of chronic kidney disease    Evaluation of current treatment plan related to chronic kidney disease self management and patient's adherence to plan as established by provider      Reviewed and discussed patient's recent follow up with [facility] Review of patient status, including review of consultant's reports, relevant laboratory and other test results Discussed patient's GFR has increased to 46. Reviewed and discussed with patient, MD referral to Hematology to evaluate for potential MGUS due to abnormal blood counts Reviewed and discussed patient's urine showed UTI, she is taking the prescribed antibiotic, educated patient on the importance of completing full course of medicine  Confirmed patient has increased her daily water intake to 48-64 oz daily Active listening / Reflection utilized  Emotional Support Provided  Discussed plans with patient for ongoing care coordination follow up and provided patient with direct contact information for nurse care coordinator  Last practice recorded BP readings:  BP Readings from Last 3 Encounters:  04/08/23 128/70  01/20/23 128/70  01/13/23 (!) 139/100   Most recent eGFR/CrCl:  Lab Results  Component Value Date   EGFR 46 (L) 05/11/2023    No components found for: "CRCL"      To undergo foot surgery without complications   On track    Care Coordination Interventions: Evaluation of current treatment plan related to Left first MPJ arthrodesis with second hammertoe/ 3rd hammertoe surgery and patient's adherence to plan as established by provider Reviewed and discussed with patient she is  scheduled for outpatient surgery at the [facility] in [place] on 07/06/23 for left first MPJ arthrodesis with second hammertoe/3rd hammertoe surgery  Answered patient questions related to risk factors for healing from foot sugery Educated patient regarding the risk for delayed healing secondary to having diabetes Positive reinforcement provided to patient for lowering her A1c and making attempts to better manage her diabetes Encouraged patient to further discuss any/all concerns she may have related to potential risk factors with her Podiatrist prior to surgery Reviewed and discussed patient's upcoming scheduled follow up with PCP for surgical clearance scheduled for 06/16/22 @2 :40 PM Discussed plans with patient for ongoing care coordination follow up and provided patient with direct contact information for nurse care coordinator Scheduled nurse follow call with patient for 06/18/23 @10 :30 AM         Our next appointment is by telephone on 06/18/23 at 10:30 AM  Please call the care guide team at [phone] if you need to cancel or reschedule your appointment.   If you are experiencing a Mental Health or Behavioral Health Crisis or need someone to talk to, please call 1-800-273-TALK (toll free, 24 hour hotline)  Patient verbalizes understanding of instructions and care plan provided today and agrees to view in MyChart. Active MyChart status and patient understanding of how to access instructions and care plan via MyChart confirmed with patient.     Delsa Sale RN BSN CCM Singer  [facility], [facility] Health Nurse Care Coordinator  Direct Dial: [phone] Website: [email or web address]

## 2023-05-13 NOTE — Progress Notes (Unsigned)
 Psychotherapy Progress Note [facility] [name], PhD LP  [name] [facility] "Teresa Golden")    MRN: [id number] Therapy format: Individual psychotherapy Date: 05/13/2023      Start: 9:21a     Stop: ***:***     Time Spent: *** min Location: Telehealth visit -- I connected with this patient by an approved telecommunication method (audio only), with her informed consent, and verifying identity and patient privacy.  I was located at my office and patient at her home.  As needed, we discussed the limitations, risks, and security and privacy concerns associated with telehealth service, including the availability and conditions which currently govern in-person appointments and the possibility that 3rd-party payment may not be fully guaranteed and she may be responsible for charges.  After she indicated understanding, we proceeded with the session.  Also discussed treatment planning, as needed, including ongoing verbal agreement with the plan, the opportunity to ask and answer all questions, her demonstrated understanding of instructions, and her readiness to call the office should symptoms worsen or she feels she is in a crisis state and needs more immediate and tangible assistance.   Session narrative (presenting needs, interim history, self-report of stressors and symptoms, applications of prior therapy, status changes, and interventions made in session) Reached about 10 after, but on an urgent call from her bank.  Kidney care has told her she is not on the verge of dialysis, just found some proteinuria, and she had a UTI she didn't know.  Didn't realize, but definitely feeling better since antibiotic.  Now also suspected for myeloma, will see hematology/oncology to be sure.  Thinking of delaying hammer toe surgery (5/12) on concern of  impaired healing due to slow Pai cells, but encouraged to ask hematology about that.    Been making it to church, still, and socializing with  female friends.  Continues to assess her housing situation, not as sure about affordability right now, foresees a $300-400 jump in rent moving to a community where she would like to live and can reliably call maintenance.  Has been told of a program that might help bridge the gap to buy a home, would involve a 4-hour class, in [place], most likely.  Considering [place] also, based on reputation for lower property costs outside the city, proximity to one of her friends, and fair likelihood of more efficient utilities.  Remains clear that her private landlady is not going to be reliable enough moving ahead, and that she wants to be sure she avoids a higher crime area.    Re [place], he has kept calling, but she is not letting herself be susceptible to it.    Re last session's revelation about alcohol, she has remained off of it while dealing with UTI.  Can feel jittery about health worries, but seeking friend support rather than alcohol.  Another case in point today with the bank problem -- not panicking but working the problem.    Therapeutic modalities: {AM:23362::"Cognitive Behavioral Therapy","Solution-Oriented/Positive Psychology"}  Mental Status/Observations:  Appearance:   {PSY:22683}     Behavior:  {PSY:21022743}  Motor:  {PSY:22302}  Speech/Language:   {PSY:22685}  Affect:  {PSY:22687}  Mood:  {PSY:31886}  Thought process:  {PSY:31888}  Thought content:    {PSY:[phone]}  Sensory/Perceptual disturbances:    {[phone]}  Orientation:  {Psych Orientation:23301::"Fully oriented"}  Attention:  {Good-Fair-Poor ratings:23770::"Good"}    Concentration:  {Good-Fair-Poor ratings:23770::"Good"}  Memory:  {PSY:860-451-1652}  Insight:    {Good-Fair-Poor ratings:23770::"Good"}  Judgment:   {  Good-Fair-Poor ratings:23770::"Good"}  Impulse Control:  {Good-Fair-Poor ratings:23770::"Good"}   Risk Assessment: Danger to Self: {Risk:22599::"No"} Self-injurious Behavior:  {Risk:22599::"No"} Danger to Others: {Risk:22599::"No"} Physical Aggression / Violence: {Risk:22599::"No"} Duty to Warn: {AMYesNo:22526::"No"} Access to Firearms a concern: {AMYesNo:22526::"No"}  Assessment of progress:  {Progress:22147::"progressing"}  Diagnosis:   ICD-10-CM   1. MDD (recurrent major depressive disorder) in remission (HCC)  F33.40     2. Adjustment disorder with mixed anxiety and depressed mood  F43.23     3. Type 2 diabetes mellitus with stage 3b chronic kidney disease, with long-term current use of insulin (HCC)  E11.22    N18.32    Z79.4     4. Multiple comorbid health conditions  R69     5. Housing or economic circumstances  Z59.9      Plan:  *** Other recommendations/advice -- As may be noted above.  Continue to utilize previously learned skills ad lib. Medication compliance -- Maintain medication as prescribed and work faithfully with relevant prescriber(s) if any changes are desired or seem indicated. Crisis service -- Aware of call list and work-in appts.  Call the clinic on-call service, 988/hotline, 911, or present to [facility] or ER if any life-threatening psychiatric crisis. Followup -- Return for time as already scheduled.  Next scheduled visit with me 05/26/2023.  Next scheduled in this office 05/26/2023.  Robley Fries, PhD [name], PhD LP Clinical Psychologist, [facility] [address] [place], [place] [address] [phone]

## 2023-05-15 ENCOUNTER — Telehealth: Payer: Self-pay | Admitting: Nurse Practitioner

## 2023-05-15 NOTE — Telephone Encounter (Signed)
 Spoke with patient confirming upcoming appointment

## 2023-05-19 ENCOUNTER — Telehealth: Payer: Self-pay

## 2023-05-19 NOTE — Telephone Encounter (Signed)
 Called patient to inform patient assistance is ready for pick up, patient aware.

## 2023-05-25 ENCOUNTER — Ambulatory Visit: Payer: Self-pay | Admitting: Licensed Clinical Social Worker

## 2023-05-25 NOTE — Patient Instructions (Signed)
 Visit Information  Thank you for taking time to visit with me today. Please don't hesitate to contact me if I can be of assistance to you.   Following are the goals we discussed today:   Goals Addressed             This Visit's Progress    COMPLETED: Care Coordination Activities       Care Coordination Interventions: Patient stated that she does not need help with her utilities because she was able to work out a payment arrangement with her utilities.  Patient stated that she is wanting a list of affordable housing, she is already on the [facility] list, but when she calls the apartments are not vacant or not built yet. Sw will mail a updated housing list. SW completed the SDOH screening questions and no other needs Patient requested to speak with the RN on her case, SW sent a message to [employer], Charity fundraiser. Sw also reminded the patient that she has an upcoming appointment on 06/18/2023 at 10:30 am with RS Little . SW will follow up with patient on 05/25/2023 at 10:00 am         No follow up needed, SW encouraged patient to contact the PCP if any SDOH arise.   Please call the care guide team at [phone] if you need to cancel or reschedule your appointment.   If you are experiencing a Mental Health or Behavioral Health Crisis or need someone to talk to, please call the Suicide and Crisis Lifeline: 988 go to [facility] [address] [place] [phone]) call 911  Patient verbalizes understanding of instructions and care plan provided today and agrees to view in MyChart. Active MyChart status and patient understanding of how to access instructions and care plan via MyChart confirmed with patient.     [name], PhD [facility], [facility] Social Worker Direct Dial: [phone]  Fax: [phone]

## 2023-05-25 NOTE — Patient Outreach (Signed)
 Care Coordination   Follow Up Visit Note   05/25/2023 Name: Teresa Golden MRN: [id number] DOB: [birth year]  Teresa Golden is a 78 y.o. year old female who sees Teresa Peng, MD for primary care. I spoke with  Teresa Golden by phone today.  What matters to the patients health and wellness today?  Housing     Goals Addressed             This Visit's Progress    COMPLETED: Care Coordination Activities       Care Coordination Interventions: Patient stated that she does not need help with her utilities because she was able to work out a payment arrangement with her utilities.  Patient stated that she is wanting a list of affordable housing, she is already on the [facility] list, but when she calls the apartments are not vacant or not built yet. Sw will mail a updated housing list. SW completed the SDOH screening questions and no other needs Patient requested to speak with the RN on her case, SW sent a message to [employer], Charity fundraiser. Sw also reminded the patient that she has an upcoming appointment on 06/18/2023 at 10:30 am with RS Little . SW will follow up with patient on 05/25/2023 at 10:00 am         SDOH assessments and interventions completed:  Yes  SDOH Interventions Today    Flowsheet Row Most Recent Value  SDOH Interventions   Food Insecurity Interventions Intervention Not Indicated  Housing Interventions Intervention Not Indicated  [Patient has been approved and selected for the Project Based Program and will will meeting with [facility] on tomorrow,]  Transportation Interventions Intervention Not Indicated  [Patient has SCAT]  Utilities Interventions Intervention Not Indicated        Care Coordination Interventions:  Yes, provided  Interventions Today    Flowsheet Row Most Recent Value  General Interventions   General Interventions Discussed/Reviewed General Interventions Reviewed  Teresa Golden has been approved and selected for the Project Based Program and will will meeting with [facility]  on tomorrow,also has SCAT]        Follow up plan: No further intervention required.   Encounter Outcome:  Patient Visit Completed   [name], PhD [facility], [facility] Social Worker Direct Dial: [phone]  Fax: [phone]

## 2023-05-25 NOTE — Progress Notes (Incomplete)
 Psychotherapy Progress Note [facility] Teresa Czar, PhD LP  [name] [facility] "Teresa Golden")    MRN: [id number] Therapy format: Individual psychotherapy Date: 04/24/2023      Start: 10:09a     Stop: 11:00a     Time Spent: 51 min Location: Telehealth visit -- I connected with this patient by an approved telecommunication method (audio only), with her informed consent, and verifying identity and patient privacy.  I was located at my office and patient at her home.  As needed, we discussed the limitations, risks, and security and privacy concerns associated with telehealth service, including the availability and conditions which currently govern in-person appointments and the possibility that 3rd-party payment may not be fully guaranteed and she may be responsible for charges.  After she indicated understanding, we proceeded with the session.  Also discussed treatment planning, as needed, including ongoing verbal agreement with the plan, the opportunity to ask and answer all questions, her demonstrated understanding of instructions, and her readiness to call the office should symptoms worsen or she feels she is in a crisis state and needs more immediate and tangible assistance.   Session narrative (presenting needs, interim history, self-report of stressors and symptoms, applications of prior therapy, status changes, and interventions made in session) Pushed up appointment in response to a challenging week.  Getting in with kidney specialist next week, when she would have been here.  78th birthday this week.    Reveals its been a "f-ed up week", largely because she called Teresa Golden, when he didn't call her for her birthday, and it turned angry.  Will be having outpatient surgery on her foot, has lined up personal support and a ride, Teresa Golden offered to come "hold your hand", and it felt patronizing enough to trigger her to let out all her dissatisfaction with how he's treated her as  just a side piece.  Been feeling guilty for laying into him, but validated here that she probably needed the chance to assert herself and needed to trick herself into setting up the opportunity.  (Joked that it's an alternate version of Teresa Golden -- "I just called ... to say ... I hate you.")  Validated the need to say what's wrong, and worth brekaing up with entirely, even if it comes to triggering his pride and getting an earful back, and it was unmistakably strong of her to do that, not something she has to be ashamed of.  Siince then, has planned a burger date with friend Teresa Golden (not a boundaries risk, closer to an adopted son type).  Broaches a new concern -- near daily drinking.  Wondering if she might need detox but immediately reluctant to consider hospital, adamant no AA.  Persuaded to see them as entirely separate issues and let's screen for medical risk before anything else.  Most days, 3-4 mixed drinks spread out in time.  On a heavy day, may go through a whole bottle of wine, or champagne, still spread out.  Worst of days will take to the bed, eat and drink.  No blackouts, not passing out, but stage 3b kidney disease and recent lab results are raising an alarm to take better care of herself.  There are days she goes without drinking, and when she visits here, she typically has coffee and/or tea but no alcohol.  Denies any urgent feelings of having to get home to alcohol, and when she does plan to make a drink, she does not have compulsive feelings.  No DTs or withdrawal sxs to note.  Concluded she does not need detox, on any level, but it is certainly smart of her to change her habit, for the sake of diabetes and kidney dz.  Affirmed taking the chance to disclose -- including again revealing to a Full man -- and reframed it as part of what's right with her, vs her common fear that things are wrong with her.  Practical suggestions to try cutting cranberry juice with nonalcoholic fluids (water, club  soda, e.g.) and to dedicate to not buy the next bottle of alcohol  Scared of going on dialysis, tearful momentarily.  Recent test results eGFR in the 30s prompted it.  Discussed kidney care.  Knows she is supposed to hydrate better but hates plain water.  Has advice to hydrate better, and lab results showing some increase in phosphorus and persistent mid-6s A1C, along with intermittent borderline anemia.  Tangential about exercise and her hx of bariatric surgery, exercise habits, and a couple of irritating women she encountered at the gym pool ... allowed to vent.    Relates hx how she got told she would be just like her mother,   Current intentions to do some housekeeping, clear some unwanted clothing.   Therapeutic modalities: Cognitive Behavioral Therapy, Solution-Oriented/Positive Psychology, and Ego-Supportive  Mental Status/Observations:  Appearance:   Not assessed     Behavior:  Appropriate  Motor:  Not assessed  Speech/Language:   Clear and Coherent  Affect:  Not assessed  Mood:  dysthymic  Thought process:  normal  Thought content:    WNL  Sensory/Perceptual disturbances:    {PSY:9471483388}  Orientation:  {Psych Orientation:23301::"Fully oriented"}  Attention:  {Good-Fair-Poor ratings:23770::"Good"}    Concentration:  {Good-Fair-Poor ratings:23770::"Good"}  Memory:  {PSY:[phone]}  Insight:    {Good-Fair-Poor ratings:23770::"Good"}  Judgment:   {Good-Fair-Poor ratings:23770::"Good"}  Impulse Control:  {Good-Fair-Poor ratings:23770::"Good"}   Risk Assessment: Danger to Self: {Risk:22599::"No"} Self-injurious Behavior: {Risk:22599::"No"} Danger to Others: {Risk:22599::"No"} Physical Aggression / Violence: {Risk:22599::"No"} Duty to Warn: {AMYesNo:22526::"No"} Access to Firearms a concern: {AMYesNo:22526::"No"}  Assessment of progress:  {Progress:22147::"progressing"}  Diagnosis:   ICD-10-CM   1. Major depressive disorder, recurrent, in partial remission (HCC)  F33.41      2. Adjustment disorder with mixed anxiety and depressed mood  F43.23     3. Relationship problem between partners  Z63.0     4. Type 2 diabetes mellitus with stage 3b chronic kidney disease, with long-term current use of insulin (HCC)  E11.22    N18.32    Z79.4     5. Multiple comorbid health conditions  R69     6. r/o Alcohol dependence (psychological)  R69      Plan:  *** Other recommendations/advice -- As may be noted above.  Continue to utilize previously learned skills ad lib. Medication compliance -- Maintain medication as prescribed and work faithfully with relevant prescriber(s) if any changes are desired or seem indicated. Crisis service -- Aware of call list and work-in appts.  Call the clinic on-call service, 988/hotline, 911, or present to [facility] or ER if any life-threatening psychiatric crisis. Followup -- Return for time as already scheduled, avail earlier @ PT's need.  Next scheduled visit with me 05/13/2023.  Next scheduled in this office 05/13/2023.  Robley Fries, PhD Teresa Czar, PhD LP Clinical Psychologist, [facility] [address], [address] [place], [place] [address] [phone]

## 2023-05-26 ENCOUNTER — Ambulatory Visit (INDEPENDENT_AMBULATORY_CARE_PROVIDER_SITE_OTHER): Payer: Medicare HMO | Admitting: Psychiatry

## 2023-05-26 DIAGNOSIS — F4323 Adjustment disorder with mixed anxiety and depressed mood: Secondary | ICD-10-CM

## 2023-05-26 DIAGNOSIS — F3340 Major depressive disorder, recurrent, in remission, unspecified: Secondary | ICD-10-CM | POA: Diagnosis not present

## 2023-05-26 DIAGNOSIS — R69 Illness, unspecified: Secondary | ICD-10-CM | POA: Diagnosis not present

## 2023-05-26 NOTE — Progress Notes (Signed)
 Psychotherapy Progress Note [facility] Teresa Ferry, PhD LP  [name] [facility] "Teresa Golden")    MRN: [id number] Therapy format: Individual psychotherapy Date: 05/26/2023      Start: 9:04a     Stop: 10:16a     Time Spent: 72 min Location: In-person   Session narrative (presenting needs, interim history, self-report of stressors and symptoms, applications of prior therapy, status changes, and interventions made in session) Misplaced her book on emotionally healthy relationships.  Further medical testing this week to check concern for myeloma.  Came through a UTI that did not have noticeable symptoms, but learned better what it can do, and antibiotics helped well.  Has a Child psychotherapist, since COVID, conversation yesterday addressing her status on [facility] wait list, which hopefully helps her need to find better housing.  Will be attending a 4-hr orientation to housing help [facility], as she recalls) in [place] this month, traveling with a friend, provided a family health issue does not prevent her.  In extended session, acknowledges hx of going to NA, after dabbling in cocaine years ago.  Recovery bond these days with friend Teresa Golden, who has a PhD and knows what it was like to have a sick mother.  Feeling led right now to work on her 4th and 5th step.  Recounts growing up with a sociopathic mother, father taking her to visit [facility] [place] [facility] at around age 55, and graduating early.  Became promiscuous to cope with the abiding loneliness, and drug history began with visits to after-hours joints in [place] decades back.  Teresa Golden has told her she needs to inventory the men she slept with, take a month or so, then burn the list as a release and forgive herself.  Suggested here that she is already working on forgiving herself, because she is noting already what loneliness and anxiety drive her young self to promiscuity and self-medication, but if it helps her to  acknowledge all forms and as many identities as she can recall, then certainly worth inventorying.  Burning is optional -- depends on whether it will actually be once and for all when she burns it, or worth holding onto the inventory to remind herself she doesn't have to do that again.  Also affirmed how, albeit painful, this is her history, not her identity.  Says she also needs to write her daughter and father letters.  One burden that father, who was her only effective parent, told her she was going to be "just like" her mother, which of course she wasn't, but the disapproval helped lead her to waste and degradation.  Another to tell her daughter (deceased) sorry for having to put her in care of the inlaws, who disapproved of Teresa Golden but loved her and were very capable of providing.  Affirmed that she chose this in love, knowing some how it could hurt.  Also affirmed how these inventories, remembrances, and incomplete messages wanting completion are all important, but her best testimony remains living well and living wisely.  If what she needs most on a given day is to take care of herself, best do that, because it is loving the living, and living forgiven.  Therapeutic modalities: Cognitive Behavioral Therapy, Solution-Oriented/Positive Psychology, Ego-Supportive, and Faith-sensitive  Mental Status/Observations:  Appearance:   Casual     Behavior:  Appropriate  Motor:  Normal and walker  Speech/Language:   Clear and Coherent  Affect:  Appropriate  Mood:  sad  Thought  process:  normal  Thought content:    WNL  Sensory/Perceptual disturbances:    WNL  Orientation:  Fully oriented  Attention:  Good    Concentration:  Good  Memory:  WNL  Insight:    Good  Judgment:   Good  Impulse Control:  Good   Risk Assessment: Danger to Self: No Self-injurious Behavior: No Danger to Others: No Physical Aggression / Violence: No Duty to Warn: No Access to Firearms a concern: No  Assessment of  progress:  stabilized  Diagnosis:   ICD-10-CM   1. MDD (recurrent major depressive disorder) in remission (HCC)  F33.40     2. Adjustment disorder with mixed anxiety and depressed mood  F43.23     3. r/o Alcohol  dependence (psychological)  R69     4. Multiple comorbid health conditions  R69      Plan:  Relationships -- Endorse cutting off dysfunctional relationships with men who use for sex, even if enjoyed, as they turn up empty.   Socialization and relationships -- Continue involvement in church and suitable groups as able.  Develop local friendships and "bonus" mother-daughter relationship at interest.  Offer to support groups such as [employer] or Al-Anon, and active elder facilities like [employer], [employer].  Endorse option to work part-time if truly able and interested.  Endorse choice over dating, so long as she can keep her integrity, maintain the view that she is worth working for and being honest for, and does not have to make any desperate decisions to feel loved and lovable.  Lonely does not have to be desperate.   Self-esteem -- Per her faith, continue to practice self-esteem grounded in God's unconditional love.  Self-affirm that whatever she might deem "stupid", she has either learned, improved, or was doing the best she could figure out at the time, while trying to deal with strong and natural feelings, e.g., loneliness.  When tempted to feel like a failure for lack of family, or for acting quickly on desires, reframe self-image as a survivor who had to take on the harshest of losses and is legitimately tempted.  Acknowledge regrets when they come up, and reaffirm having learned from them.  Options to inventory wrongs a la 4th and 5th steps of AA and to write letters, or write and burn confessions, as motivated, but stay true that taking care of herself now is itself righting wrongs and enacting healthy self-esteem. General health -- Endorse any healthy activity, to  include pool/gym if able and interested.  Address diabetes and renal self-care promptly and reliably.  Recommend taming light in late evening and after bed, with options to use soothing sound and orange lenses. Living situation -- Endorse researching more reliable, better suited living space, and as needed, research tenant's rights in the event landlady defaults on the mortgage.  Endorse learning about supported possible home ownership if desired.  For housekeeping, try to make a practice of smaller, more frequent efforts and/or bringing in help as needed, with the mindset that it's all a "gift to the lady who lives here".   Family -- Endorse boundary/limits with Cain Castillo.  Endorse "adoptive" family relationships where present and strong friendships as enlarged family. Other recommendations/advice -- As may be noted above.  Continue to utilize previously learned skills ad lib. Medication compliance -- Maintain medication as prescribed and work faithfully with relevant prescriber(s) if any changes are desired or seem indicated. Crisis service -- Aware of call list and work-in appts.  Call  the clinic on-call service, 988/hotline, 911, or present to [facility] or ER if any life-threatening psychiatric crisis. Followup -- Return for time as already scheduled.  Next scheduled visit with me 06/09/2023.  Next scheduled in this office 06/09/2023.  Maretta Shaper, PhD Teresa Ferry, PhD LP Clinical Psychologist, [facility] [address] [place], [place] [address] [phone]

## 2023-05-27 ENCOUNTER — Other Ambulatory Visit: Payer: Self-pay | Admitting: Nurse Practitioner

## 2023-05-27 DIAGNOSIS — D472 Monoclonal gammopathy: Secondary | ICD-10-CM

## 2023-05-27 NOTE — Progress Notes (Unsigned)
 New Hematology/Oncology Consult   Requesting MD: Susann Givens PA  [phone]      Reason for Consult: Serum M spike  HPI: Teresa Golden is a 78 year old woman referred for evaluation of a serum M spike.  SPEP completed 04/08/2023 showed an M spike of 0.3 with a faint band in the gamma region suspicious for monoclonal immunoglobulin.  This compares to an SPEP completed 05/02/2021 also with a serum M spike of 0.3.  Additional labs from 04/08/2023-hemoglobin 12.8, Ziebarth count 7.0, platelet count 229,000, creatinine 1.41, calcium 9.3, total protein 6.9.     Past Medical History:  Diagnosis Date   Arthritis    Chronic kidney disease    self reports ckd stage 3    Depression    Diabetes mellitus, type II, insulin dependent (HCC)    With neurologic complications. 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    Hypertensive nephropathy 07/05/2018   Hypothyroidism    Hypothyroidism 02/06/2018   Morbid obesity with BMI of 50.0-59.9, adult (HCC)    Scoliosis      Past Surgical History:  Procedure Laterality Date   ABDOMINAL HYSTERECTOMY     uterus   CHOLECYSTECTOMY     DIAGNOSTIC LAPAROSCOPY     DILATION AND CURETTAGE OF UTERUS     EYE SURGERY     cataract extraction bilateral    INCISION AND DRAINAGE OF WOUND N/A 10/15/2020   Procedure: IRRIGATION AND DEBRIDEMENT WOUND;  Surgeon: Peggye Form, DO;  Location: [facility] OR;  Service: Plastics;  Laterality: N/A;  60 min   JOINT REPLACEMENT     Left knee   KNEE ARTHROPLASTY Right 07/30/2017   Procedure: RIGHT TOTAL KNEE ARTHROPLASTY WITH COMPUTER NAVIGATION;  Surgeon: Samson Frederic, MD;  Location: [facility] ORS;  Service: Orthopedics;  Laterality: Right;  Needs RNFA   LAPAROSCOPIC GASTRIC BANDING     and  reversal   LAPAROSCOPIC GASTRIC SLEEVE RESECTION N/A 08/26/2016   Procedure: LAPAROSCOPIC GASTRIC SLEEVE RESECTION WITH UPPER ENOD;  Surgeon: Luretha Murphy, MD;  Location: [facility] ORS;  Service: General;  Laterality: N/A;   PANNICULECTOMY N/A 09/26/2020   Procedure: PANNICULECTOMY;  Surgeon: Peggye Form, DO;  Location: [facility] OR;  Service: Plastics;  Laterality: N/A;   TONSILLECTOMY     TRANSTHORACIC ECHOCARDIOGRAM  11/2013   EF 65-70%. Normal diastolic Fxn.  Normal Valves.   UMBILICAL HERNIA REPAIR N/A 09/26/2020   Procedure: HERNIA REPAIR UMBILICAL ADULT;  Surgeon: Violeta Gelinas, MD;  Location: [facility] OR;  Service: General;  Laterality: N/A;     Current Outpatient Medications:    allopurinol (ZYLOPRIM) 100 MG tablet, Take 1 tablet (100 mg total) by mouth daily., Disp: 90 tablet, Rfl: 2   amLODipine (NORVASC) 5 MG tablet, Take 1 tablet by mouth once daily, Disp: 90 tablet, Rfl: 0   AZO-CRANBERRY PO, Take 1 tablet by mouth daily., Disp: , Rfl:    B Complex-C (B-COMPLEX WITH VITAMIN C) tablet, Take 1 tablet by mouth 3 (three) times a week., Disp: , Rfl:    betamethasone dipropionate 0.05 % cream, Apply topically 2 (two) times daily., Disp: 30 g, Rfl: 0   Biotin 82956 MCG TABS, Take 10,000 mcg by mouth daily., Disp: , Rfl:    cetirizine (ZYRTEC)  10 MG tablet, Take 1 tablet (10 mg total) by mouth daily., Disp: 30 tablet, Rfl: 1   Cholecalciferol 5000 units TABS, Take 5,000 Units by mouth daily., Disp: , Rfl:    colchicine 0.6 MG tablet, Take 1 tablet by mouth for gout attack., Disp: 30 tablet, Rfl: 1   dapagliflozin propanediol (FARXIGA) 10 MG TABS tablet, Take 1 tablet (10 mg total) by mouth daily., Disp: 90 tablet, Rfl: 3   diclofenac Sodium (VOLTAREN) 1 % GEL, APPLY 2 GRAMS TO AFFECTED AREA(S) 4 TIMES DAILY AS NEEDED (Patient taking differently: Apply 2-4 g topically every 6 (six) hours as needed (to affected areas).), Disp: 200 g, Rfl: 2   gabapentin (NEURONTIN) 300 MG capsule, Take 2 capsules  (600 mg total) by mouth at bedtime., Disp: 180 capsule, Rfl: 2   glucose blood (ONETOUCH VERIO) test strip, USE TO CHECK BLOOD SUGAR   TWO TIMES A DAY AS         INSTRUCTED, Disp: 200 strip, Rfl: 3   Insulin Degludec (TRESIBA) 100 UNIT/ML SOLN, Inject 10 Units/oz/day into the skin at bedtime. Patient is taking at bedtime, Disp: , Rfl:    Lancets (ONETOUCH DELICA PLUS LANCET33G) MISC, USE TO CHECK BLOOD SUGARS  TWO TIMES A DAY AS         INSTRUCTED, Disp: 200 each, Rfl: 3   levothyroxine (SYNTHROID) 25 MCG tablet, Take 1/2 tablet daily., Disp: 90 tablet, Rfl: 1   losartan (COZAAR) 100 MG tablet, TAKE 1 TABLET DAILY, Disp: 90 tablet, Rfl: 2   methylPREDNISolone (MEDROL DOSEPAK) 4 MG TBPK tablet, Day 1: 8mg  before breakfast, 4 mg after lunch, 4 mg after supper, and 8 mg at bedtime Day 2: 4 mg before breakfast, 4 mg after lunch, 4 mg  after supper, and 8 mg  at bedtime Day 3:  4 mg  before breakfast, 4 mg  after lunch, 4 mg after supper, and 4 mg  at bedtime Day 4: 4 mg  before breakfast, 4 mg  after lunch, and 4 mg at bedtime Day 5: 4 mg  before breakfast and 4 mg at bedtime Day 6: 4 mg  before breakfast, Disp: 1 each, Rfl: 0   metoprolol succinate (TOPROL-XL) 25 MG 24 hr tablet, TAKE 1 TABLET AT BEDTIME   (DISCONTINUE COREG), Disp: 90 tablet, Rfl: 1   Multiple Vitamins-Minerals (MULTIVITAMIN WITH MINERALS) tablet, Take 1 tablet by mouth daily. Women's 50+, Disp: , Rfl:    pantoprazole (PROTONIX) 40 MG tablet, Take 40 mg by mouth daily as needed., Disp: , Rfl:    Semaglutide, 1 MG/DOSE, (OZEMPIC, 1 MG/DOSE,) 4 MG/3ML SOPN, Inject 1 mg into the skin once a week., Disp: 9 mL, Rfl: 1   simvastatin (ZOCOR) 10 MG tablet, Take 1 tablet by mouth once daily, Disp: 90 tablet, Rfl: 2   triamcinolone ointment (KENALOG) 0.1 %, Apply 1 Application topically 2 (two) times daily., Disp: , Rfl: :     Allergies  Allergen Reactions   Meloxicam Other (See Comments)    Fever; muscle aches; "flu-like"  symptoms "Allergic," per MAR   Other Other (See Comments)    "Rose fever and hay fever"    FH:  SOCIAL HISTORY:  Review of Systems:  Physical Exam:  There were no vitals taken for this visit.  HEENT: *** Lungs: *** Cardiac: *** Abdomen: *** GU: ***  Vascular: *** Lymph nodes: *** Neurologic: *** Skin: *** Musculoskeletal: ***  LABS:  No results for input(s): "WBC", "HGB", "HCT", "PLT" in the last  72 hours.  No results for input(s): "NA", "K", "CL", "CO2", "GLUCOSE", "BUN", "CREATININE", "CALCIUM" in the last 72 hours.    RADIOLOGY:  No results found.  Assessment and Plan:   ***    Lonna Cobb, NP 05/27/2023, 12:19 PM

## 2023-05-28 ENCOUNTER — Other Ambulatory Visit: Payer: Self-pay | Admitting: Internal Medicine

## 2023-05-28 ENCOUNTER — Inpatient Hospital Stay: Attending: Nurse Practitioner

## 2023-05-28 ENCOUNTER — Inpatient Hospital Stay: Admitting: Nurse Practitioner

## 2023-05-28 ENCOUNTER — Inpatient Hospital Stay

## 2023-05-28 ENCOUNTER — Inpatient Hospital Stay (HOSPITAL_BASED_OUTPATIENT_CLINIC_OR_DEPARTMENT_OTHER): Admitting: Nurse Practitioner

## 2023-05-28 ENCOUNTER — Encounter: Payer: Self-pay | Admitting: Nurse Practitioner

## 2023-05-28 VITALS — BP 145/63 | HR 55 | Temp 98.1°F | Resp 18 | Ht 65.0 in | Wt 205.3 lb

## 2023-05-28 DIAGNOSIS — N183 Chronic kidney disease, stage 3 unspecified: Secondary | ICD-10-CM | POA: Insufficient documentation

## 2023-05-28 DIAGNOSIS — Z888 Allergy status to other drugs, medicaments and biological substances status: Secondary | ICD-10-CM | POA: Diagnosis not present

## 2023-05-28 DIAGNOSIS — Z90710 Acquired absence of both cervix and uterus: Secondary | ICD-10-CM | POA: Diagnosis not present

## 2023-05-28 DIAGNOSIS — D472 Monoclonal gammopathy: Secondary | ICD-10-CM | POA: Diagnosis not present

## 2023-05-28 DIAGNOSIS — Z9049 Acquired absence of other specified parts of digestive tract: Secondary | ICD-10-CM | POA: Insufficient documentation

## 2023-05-28 DIAGNOSIS — Z79899 Other long term (current) drug therapy: Secondary | ICD-10-CM | POA: Insufficient documentation

## 2023-05-28 DIAGNOSIS — Z8249 Family history of ischemic heart disease and other diseases of the circulatory system: Secondary | ICD-10-CM | POA: Insufficient documentation

## 2023-05-28 DIAGNOSIS — G8929 Other chronic pain: Secondary | ICD-10-CM | POA: Insufficient documentation

## 2023-05-28 DIAGNOSIS — E1122 Type 2 diabetes mellitus with diabetic chronic kidney disease: Secondary | ICD-10-CM | POA: Diagnosis not present

## 2023-05-28 DIAGNOSIS — R350 Frequency of micturition: Secondary | ICD-10-CM

## 2023-05-28 DIAGNOSIS — Z806 Family history of leukemia: Secondary | ICD-10-CM | POA: Insufficient documentation

## 2023-05-28 DIAGNOSIS — Z86718 Personal history of other venous thrombosis and embolism: Secondary | ICD-10-CM | POA: Diagnosis not present

## 2023-05-28 DIAGNOSIS — Z87891 Personal history of nicotine dependence: Secondary | ICD-10-CM | POA: Diagnosis not present

## 2023-05-28 DIAGNOSIS — G629 Polyneuropathy, unspecified: Secondary | ICD-10-CM | POA: Diagnosis not present

## 2023-05-28 DIAGNOSIS — Z8619 Personal history of other infectious and parasitic diseases: Secondary | ICD-10-CM | POA: Diagnosis not present

## 2023-05-28 DIAGNOSIS — M25559 Pain in unspecified hip: Secondary | ICD-10-CM | POA: Diagnosis not present

## 2023-05-28 DIAGNOSIS — E039 Hypothyroidism, unspecified: Secondary | ICD-10-CM | POA: Diagnosis not present

## 2023-05-28 LAB — URINALYSIS, COMPLETE (UACMP) WITH MICROSCOPIC
Bilirubin Urine: NEGATIVE
Glucose, UA: >1000 mg/dL — AB
Hgb urine dipstick: NEGATIVE
Ketones, ur: NEGATIVE mg/dL
Leukocytes,Ua: NEGATIVE
Nitrite: NEGATIVE
Protein, ur: NEGATIVE mg/dL
Specific Gravity, Urine: 1.018 (ref 1.005–1.030)
pH: 6 (ref 5.0–8.0)

## 2023-05-28 LAB — CMP (CANCER CENTER ONLY)
ALT: 15 U/L (ref 0–44)
AST: 24 U/L (ref 15–41)
Albumin: 4 g/dL (ref 3.5–5.0)
Alkaline Phosphatase: 69 U/L (ref 38–126)
Anion gap: 8 (ref 5–15)
BUN: 18 mg/dL (ref 8–23)
CO2: 26 mmol/L (ref 22–32)
Calcium: 9.5 mg/dL (ref 8.9–10.3)
Chloride: 105 mmol/L (ref 98–111)
Creatinine: 1.13 mg/dL — ABNORMAL HIGH (ref 0.44–1.00)
GFR, Estimated: 49 mL/min — ABNORMAL LOW (ref >60)
Glucose, Bld: 148 mg/dL — ABNORMAL HIGH (ref 70–99)
Potassium: 4 mmol/L (ref 3.5–5.1)
Sodium: 139 mmol/L (ref 135–145)
Total Bilirubin: 0.7 mg/dL (ref 0.0–1.2)
Total Protein: 7.2 g/dL (ref 6.5–8.1)

## 2023-05-28 LAB — CBC WITH DIFFERENTIAL (CANCER CENTER ONLY)
Abs Immature Granulocytes: 0.02 10*3/uL (ref 0.00–0.07)
Basophils Absolute: 0.1 10*3/uL (ref 0.0–0.1)
Basophils Relative: 1 %
Eosinophils Absolute: 0.2 10*3/uL (ref 0.0–0.5)
Eosinophils Relative: 3 %
HCT: 37.4 % (ref 36.0–46.0)
Hemoglobin: 12.6 g/dL (ref 12.0–15.0)
Immature Granulocytes: 0 %
Lymphocytes Relative: 25 %
Lymphs Abs: 1.8 10*3/uL (ref 0.7–4.0)
MCH: 27.8 pg (ref 26.0–34.0)
MCHC: 33.7 g/dL (ref 30.0–36.0)
MCV: 82.6 fL (ref 80.0–100.0)
Monocytes Absolute: 0.6 10*3/uL (ref 0.1–1.0)
Monocytes Relative: 8 %
Neutro Abs: 4.7 10*3/uL (ref 1.7–7.7)
Neutrophils Relative %: 63 %
Platelet Count: 195 10*3/uL (ref 150–400)
RBC: 4.53 MIL/uL (ref 3.87–5.11)
RDW: 15 % (ref 11.5–15.5)
WBC Count: 7.4 10*3/uL (ref 4.0–10.5)
nRBC: 0 % (ref 0.0–0.2)

## 2023-05-28 LAB — LACTATE DEHYDROGENASE: LDH: 221 U/L — ABNORMAL HIGH (ref 98–192)

## 2023-05-29 ENCOUNTER — Encounter: Admitting: Nurse Practitioner

## 2023-05-29 ENCOUNTER — Other Ambulatory Visit

## 2023-05-29 ENCOUNTER — Telehealth: Payer: Self-pay

## 2023-05-29 LAB — KAPPA/LAMBDA LIGHT CHAINS
Kappa free light chain: 34.1 mg/L — ABNORMAL HIGH (ref 3.3–19.4)
Kappa, lambda light chain ratio: 2.07 — ABNORMAL HIGH (ref 0.26–1.65)
Lambda free light chains: 16.5 mg/L (ref 5.7–26.3)

## 2023-05-29 NOTE — Telephone Encounter (Signed)
 I have forwarded the patient's urinalysis results to her primary care provider (PCP). I also reached out to the PCP to confirm receipt of the results. Additionally, I called the patient to inform her of the findings, and she indicated that she will follow up with her PCP regarding the results.

## 2023-05-30 ENCOUNTER — Ambulatory Visit
Admission: RE | Admit: 2023-05-30 | Discharge: 2023-05-30 | Disposition: A | Discharge status: Home / Self Care | Source: Ambulatory Visit | Attending: Nurse Practitioner | Admitting: Nurse Practitioner

## 2023-05-30 ENCOUNTER — Telehealth: Payer: Self-pay | Admitting: Nurse Practitioner

## 2023-05-30 VITALS — BP 145/76 | HR 63 | Temp 98.1°F | Resp 17

## 2023-05-30 DIAGNOSIS — B9620 Unspecified Escherichia coli [E. coli] as the cause of diseases classified elsewhere: Secondary | ICD-10-CM | POA: Diagnosis not present

## 2023-05-30 DIAGNOSIS — R81 Glycosuria: Secondary | ICD-10-CM

## 2023-05-30 DIAGNOSIS — E1122 Type 2 diabetes mellitus with diabetic chronic kidney disease: Secondary | ICD-10-CM | POA: Diagnosis not present

## 2023-05-30 DIAGNOSIS — N390 Urinary tract infection, site not specified: Secondary | ICD-10-CM

## 2023-05-30 DIAGNOSIS — N183 Chronic kidney disease, stage 3 unspecified: Secondary | ICD-10-CM

## 2023-05-30 LAB — POCT URINALYSIS DIP (MANUAL ENTRY)
Bilirubin, UA: NEGATIVE
Blood, UA: NEGATIVE
Glucose, UA: >=1000 mg/dL — AB
Ketones, POC UA: NEGATIVE mg/dL
Leukocytes, UA: NEGATIVE
Nitrite, UA: POSITIVE — AB
Protein Ur, POC: NEGATIVE mg/dL
Spec Grav, UA: 1.015 (ref 1.010–1.025)
Urobilinogen, UA: 1 U/dL
pH, UA: 5.5 (ref 5.0–8.0)

## 2023-05-30 LAB — URINE CULTURE: Culture: >=100000 — AB

## 2023-05-30 MED ORDER — SULFAMETHOXAZOLE-TRIMETHOPRIM 800-160 MG PO TABS: 1.0000 | ORAL_TABLET | Freq: Two times a day (BID) | ORAL | 0 refills | Status: AC

## 2023-05-30 NOTE — ED Triage Notes (Signed)
   Went on 03/07 to nephrologist and was dx with UTI, given abx. Completed course, felt better and started having sxs again Thursday. Pt went to her oncologist and had a recurrent UTI. Abx was not prescribed by oncology. Pt states she has urinary frequency, abd pain and has fatigue. Pt states she has an odor in urine.

## 2023-05-30 NOTE — Telephone Encounter (Signed)
 Patient has been scheduled for follow-up visit per 05/28/23 LOS.  LVM notifying pt of appt details, provided my direct number to pt if appt changes need to be made.

## 2023-05-30 NOTE — Discharge Instructions (Addendum)
 The urine culture done at the hematology center at [facility] on 05/28/2023 came back positive for a bacteria called E. coli, which is a very common cause of urinary tract infections. You will need to start an antibiotic to treat this infection. The antibiotic you have been given is safe for you to take, even with your stage 3 chronic kidney disease. Be sure to take the medication exactly as prescribed, and take it with food to help prevent any stomach upset.  The urinalysis we did today in the clinic showed nitrites and glucose in your urine, but no [place] blood cells or blood. Since you recently had a positive culture and we are starting treatment based on that result, we will not send this same for cultures.  Please follow up with Dr. Allyne Gee in 7 to 10 days to have another urine culture done. This is important to make sure the infection has cleared completely. In the meantime, drink plenty of fluids to help keep your urine clear and light yellow.

## 2023-05-30 NOTE — ED Provider Notes (Signed)
 [facility]    CSN: [id number] Arrival date & time: 05/30/23  1315      History   Chief Complaint No chief complaint on file.   HPI Teresa Golden is a 78 y.o. female.   Organism ID, Bacteria ESCHERICHIA COLI Abnormal  Resulting Agency [facility]   Susceptibility    Escherichia coli   MIC   AMPICILLIN 4 SENSITIVE Sensitive   AMPICILLIN/SULBACTAM <=2 SENSITIVE Sensitive   CEFAZOLIN <=4 SENSITIVE Sensitive   CEFEPIME <=0.12 SENS... Sensitive   CEFTRIAXONE <=0.25 SENS... Sensitive   CIPROFLOXACIN <=0.25 SENS... Sensitive   GENTAMICIN <=1 SENSITIVE Sensitive   IMIPENEM <=0.25 SENS... Sensitive   NITROFURANTOIN <=16 SENSIT... Sensitive   PIP/TAZO <=4 SENSITI... Sensitive   TRIMETH/SULFA <=20 SENSIT... Sensitive      PCP sent patient to [facility].  Diabetes well controlled but concerns for issues with decreased kidney function  PCP [facility]  1st appt 3/7  Standard blood work and urine done  Dx UTI - patient denied any sx of UTI at time  They prescribed cephalexin x 5 days  Started 3/10-3/14  Kidney specialist concerned for multiple myeloma based on protein in the blood  Sent to hematology/oncology at [facility]  Had blood work and urine done on 4/3  Multiple Myeloma Panel pending  Reported frequent urination and pressure  Concerned for UTI  Culture was done  Patient received called to tell her that her culture was positive and they were send results to PCP  No treatment was given   Patient presents to urgent care today as she is anxious to start treatment and doesn't want to get worse  Creatinine 1.13  GFR 49 Stage 3a CKD    Bactrim no dosage adjust ment necessary with CrCL greater than 30         Creatitine on 4/3 was 1.13 and eGFR 49  Past Medical History:  Diagnosis Date   Arthritis    Chronic kidney disease    self reports ckd stage 3    Depression    Diabetes mellitus, type II, insulin  dependent (HCC)    With neurologic complications. 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    Hypertensive nephropathy 07/05/2018   Hypothyroidism    Hypothyroidism 02/06/2018   Morbid obesity with BMI of 50.0-59.9, adult [facility])    Scoliosis     Patient Active Problem List   Diagnosis Date Noted   Tension-type headache, not intractable 04/08/2023   Pain due to onychomycosis of toenails of both feet 03/20/2023   Hallux valgus, acquired 03/20/2023   GERD with apnea 01/01/2023   Gasping for breath 01/01/2023   Snoring 12/02/2022   Abnormal laboratory test 12/02/2022   Hair loss 10/18/2022   Left ear impacted cerumen 06/30/2022   Estrogen deficiency 06/30/2022   Diarrhea 05/14/2022   Glomerular disorders in diseases classified elsewhere 05/13/2022   Inflammatory and toxic neuropathy (HCC) 05/13/2022   Polyneuropathy due to type 2 diabetes mellitus (HCC) 05/13/2022   Arthritis of hand 04/07/2022   Arthritis of right wrist 04/07/2022   Carpal tunnel syndrome of right wrist 03/05/2022   Acute idiopathic gout of right ankle 10/20/2021   Primary insomnia 10/20/2021   Class  1 obesity due to excess calories with serious comorbidity and body mass index (BMI) of 33.0 to 33.9 in adult 10/20/2021   Infected wound 11/02/2020   Pressure injury of skin 10/22/2020   Wound infection after surgery 10/12/2020   Normocytic anemia 10/12/2020   Obesity (BMI 30-39.9) 10/12/2020   Sepsis (HCC) 10/12/2020   Personal history of COVID-19 06/21/2020   Hypertension 06/18/2020   Type 2 diabetes mellitus with stage 3 chronic kidney disease, with long-term current use of insulin (HCC) 11/29/2019   Trochanteric bursitis of right hip 11/18/2019   Panniculitis 09/30/2019    Back pain 09/30/2019   Muscle weakness 03/17/2019   Hepatoma (HCC) 01/21/2019   Cirrhosis of liver without ascites (HCC) 07/05/2018   Hypertensive nephropathy 07/05/2018   Stage 3a chronic kidney disease (HCC) 07/05/2018   Chronic left shoulder pain 07/05/2018   Primary hypothyroidism 02/06/2018   Hepatitis C virus infection cured after antiviral drug therapy 02/04/2018   Osteoarthritis of right knee 07/30/2017   Bilateral knee pain 04/29/2017   Cervical radiculopathy 04/28/2017   Degeneration of lumbar intervertebral disc 03/13/2017   Degenerative scoliosis 03/13/2017   Degenerative spondylolisthesis 03/13/2017   Flatback syndrome 03/13/2017   S/P laparoscopic sleeve gastrectomy July 2018 08/26/2016   Preop cardiovascular exam 07/30/2016   Dyspnea on exertion 07/30/2016   Bilateral lower extremity edema 07/30/2016   Edema of lower extremity 07/30/2016    Past Surgical History:  Procedure Laterality Date   ABDOMINAL HYSTERECTOMY     uterus   CHOLECYSTECTOMY     DIAGNOSTIC LAPAROSCOPY     DILATION AND CURETTAGE OF UTERUS     EYE SURGERY     cataract extraction bilateral    INCISION AND DRAINAGE OF WOUND N/A 10/15/2020   Procedure: IRRIGATION AND DEBRIDEMENT WOUND;  Surgeon: Peggye Form, DO;  Location: [facility] OR;  Service: Plastics;  Laterality: N/A;  60 min   JOINT REPLACEMENT     Left knee   KNEE ARTHROPLASTY Right 07/30/2017   Procedure: RIGHT TOTAL KNEE ARTHROPLASTY WITH COMPUTER NAVIGATION;  Surgeon: Samson Frederic, MD;  Location: [facility];  Service: Orthopedics;  Laterality: Right;  Needs RNFA   LAPAROSCOPIC GASTRIC BANDING     and reversal   LAPAROSCOPIC GASTRIC SLEEVE RESECTION N/A 08/26/2016   Procedure: LAPAROSCOPIC GASTRIC SLEEVE RESECTION WITH UPPER ENOD;  Surgeon: Luretha Murphy, MD;  Location: [facility];  Service: General;  Laterality: N/A;   PANNICULECTOMY N/A 09/26/2020   Procedure: PANNICULECTOMY;  Surgeon: Peggye Form, DO;  Location: [facility] OR;  Service:  Plastics;  Laterality: N/A;   TONSILLECTOMY     TRANSTHORACIC ECHOCARDIOGRAM  11/2013   EF 65-70%. Normal diastolic Fxn.  Normal Valves.   UMBILICAL HERNIA REPAIR N/A 09/26/2020   Procedure: HERNIA REPAIR UMBILICAL ADULT;  Surgeon: Violeta Gelinas, MD;  Location: [facility] OR;  Service: General;  Laterality: N/A;    OB History   No obstetric history on file.      Home Medications    Prior to Admission medications   Medication Sig Start Date End Date Taking? Authorizing Provider  sulfamethoxazole-trimethoprim (BACTRIM DS) 800-160 MG tablet Take 1 tablet by mouth 2 (two) times daily for 7 days. 05/30/23 06/06/23 Yes Lurline Idol, FNP  allopurinol (ZYLOPRIM) 100 MG tablet Take 1 tablet (100 mg total) by mouth daily. 10/29/22   [name], NP  amLODipine (NORVASC) 5 MG tablet Take 1 tablet by mouth once daily 08/11/22   Dorothyann Peng, MD  AZO-CRANBERRY PO Take 1 tablet by mouth daily.  [provider]  B Complex-C (B-COMPLEX WITH VITAMIN C) tablet Take 1 tablet by mouth 3 (three) times a week.    [provider]  betamethasone dipropionate 0.05 % cream Apply topically 2 (two) times daily. 10/29/22   [name], NP  Biotin 47829 MCG TABS Take 10,000 mcg by mouth daily.    [provider]  cetirizine (ZYRTEC) 10 MG tablet Take 1 tablet (10 mg total) by mouth daily. 10/29/22   [name], NP  Cholecalciferol 5000 units TABS Take 5,000 Units by mouth daily.    [provider]  dapagliflozin propanediol (FARXIGA) 10 MG TABS tablet Take 1 tablet (10 mg total) by mouth daily. 08/13/22   Dorothyann Peng, MD  diclofenac Sodium (VOLTAREN) 1 % GEL APPLY 2 GRAMS TO AFFECTED AREA(S) 4 TIMES DAILY AS NEEDED Patient taking differently: Apply 2-4 g topically every 6 (six) hours as needed (to affected areas). 02/28/19   Dorothyann Peng, MD  gabapentin (NEURONTIN) 300 MG capsule Take 2 capsules (600 mg total) by mouth at bedtime. 01/19/23 10/16/23  Louann Sjogren, DPM  glucose  blood (ONETOUCH VERIO) test strip USE TO CHECK BLOOD SUGAR   TWO TIMES A DAY AS         INSTRUCTED 01/24/21   Dorothyann Peng, MD  Insulin Degludec (TRESIBA) 100 UNIT/ML SOLN Inject 10 Units/oz/day into the skin at bedtime. Patient is taking at bedtime    [provider]  Lancets (ONETOUCH DELICA PLUS LANCET33G) MISC USE TO CHECK BLOOD SUGARS  TWO TIMES A DAY AS         INSTRUCTED 01/24/21   Dorothyann Peng, MD  levothyroxine (SYNTHROID) 25 MCG tablet Take 1/2 tablet daily. 03/20/22   Dorothyann Peng, MD  losartan (COZAAR) 100 MG tablet TAKE 1 TABLET DAILY 04/28/23   Dorothyann Peng, MD  metoprolol succinate (TOPROL-XL) 25 MG 24 hr tablet TAKE 1 TABLET AT BEDTIME   (DISCONTINUE COREG) 05/28/23   Dorothyann Peng, MD  Multiple Vitamins-Minerals (MULTIVITAMIN WITH MINERALS) tablet Take 1 tablet by mouth daily. Women's 50+    [provider]  pantoprazole (PROTONIX) 40 MG tablet Take 40 mg by mouth daily as needed. 09/15/21   [provider]  Semaglutide, 1 MG/DOSE, (OZEMPIC, 1 MG/DOSE,) 4 MG/3ML SOPN Inject 1 mg into the skin once a week. 04/24/21   Dorothyann Peng, MD  simvastatin (ZOCOR) 10 MG tablet Take 1 tablet by mouth once daily 05/08/23   Dorothyann Peng, MD  triamcinolone ointment (KENALOG) 0.1 % Apply 1 Application topically 2 (two) times daily. 11/25/22   [provider]  SEMGLEE, YFGN, 100 UNIT/ML Pen Inject 9 Units into the skin at bedtime. Patient not taking: Reported on 05/28/2023  11/06/20  [provider]    Family History Family History  Problem Relation Age of Onset   Heart attack Mother    Heart failure Mother    Stroke Father     Social History Social History   Tobacco Use   Smoking status: Former    Current packs/day: 0.25    Average packs/day: 0.3 packs/day for 10.0 years (2.5 ttl pk-yrs)    Types: Cigarettes   Smokeless tobacco: Never   Tobacco comments:    quit 20 years ago  Vaping Use   Vaping status: Never Used  Substance Use Topics    Alcohol use: Not Currently    Alcohol/week: 1.0 standard drink of alcohol    Types: 1 Glasses of wine per week    Comment: occasional   Drug use:  No     Allergies   Meloxicam and Other   Review of Systems Review of Systems   Physical Exam Triage Vital Signs ED Triage Vitals  Encounter Vitals Group     BP 05/30/23 1355 (!) 145/76     Systolic BP Percentile --      Diastolic BP Percentile --      Pulse Rate 05/30/23 1355 63     Resp 05/30/23 1355 17     Temp 05/30/23 1355 98.1 F (36.7 C)     Temp Source 05/30/23 1355 Oral     SpO2 05/30/23 1355 95 %     Weight --      Height --      Head Circumference --      Peak Flow --      Pain Score 05/30/23 1354 4     Pain Loc --      Pain Education --      Exclude from Growth Chart --    No data found.  Updated Vital Signs BP (!) 145/76   Pulse 63   Temp 98.1 F (36.7 C) (Oral)   Resp 17   SpO2 95%   Visual Acuity Right Eye Distance:   Left Eye Distance:   Bilateral Distance:    Right Eye Near:   Left Eye Near:    Bilateral Near:     Physical Exam   UC Treatments / Results  Labs (all labs ordered are listed, but only abnormal results are displayed) Labs Reviewed  POCT URINALYSIS DIP (MANUAL ENTRY) - Abnormal; Notable for the following components:      Result Value   Glucose, UA >=1,000 (*)    Nitrite, UA Positive (*)    All other components within normal limits    EKG   Radiology No results found.  Procedures Procedures (including critical care time)  Medications Ordered in UC Medications - No data to display  Initial Impression / Assessment and Plan / UC Course  I have reviewed the triage vital signs and the nursing notes.  Pertinent labs & imaging results that were available during my care of the patient were reviewed by me and considered in my medical decision making (see chart for details).     *** Final Clinical Impressions(s) / UC Diagnoses   Final diagnoses:  E-coli UTI   Glycosuria     Discharge Instructions      The urine culture done at the hematology center at [facility] on 05/28/2023 came back positive for a bacteria called E. coli, which is a very common cause of urinary tract infections. You will need to start an antibiotic to treat this infection. The antibiotic you have been given is safe for you to take, even with your stage 3 chronic kidney disease. Be sure to take the medication exactly as prescribed, and take it with food to help prevent any stomach upset.  The urinalysis we did today in the clinic showed nitrites and glucose in your urine, but no [employer] blood cells or blood. Since you recently had a positive culture and we are starting treatment based on that result, we will not send this same for cultures.  Please follow up with Dr. Allyne Gee in 7 to 10 days to have another urine culture done. This is important to make sure the infection has cleared completely. In the meantime, drink plenty of fluids to help keep your urine clear and light yellow.     ED Prescriptions  Medication Sig Dispense Auth. Provider   sulfamethoxazole-trimethoprim (BACTRIM DS) 800-160 MG tablet Take 1 tablet by mouth 2 (two) times daily for 7 days. 14 tablet Lurline Idol, FNP      PDMP not reviewed this encounter.

## 2023-06-02 LAB — MULTIPLE MYELOMA PANEL, SERUM
Albumin SerPl Elph-Mcnc: 3.6 g/dL (ref 2.9–4.4)
Albumin/Glob SerPl: 1.2 (ref 0.7–1.7)
Alpha 1: 0.2 g/dL (ref 0.0–0.4)
Alpha2 Glob SerPl Elph-Mcnc: 0.7 g/dL (ref 0.4–1.0)
B-Globulin SerPl Elph-Mcnc: 1 g/dL (ref 0.7–1.3)
Gamma Glob SerPl Elph-Mcnc: 1.2 g/dL (ref 0.4–1.8)
Globulin, Total: 3.2 g/dL (ref 2.2–3.9)
IgA: 241 mg/dL (ref 64–422)
IgG (Immunoglobin G), Serum: 1381 mg/dL (ref 586–1602)
IgM (Immunoglobulin M), Srm: 110 mg/dL (ref 26–217)
M Protein SerPl Elph-Mcnc: 0.3 g/dL — ABNORMAL HIGH
Total Protein ELP: 6.8 g/dL (ref 6.0–8.5)

## 2023-06-04 ENCOUNTER — Telehealth: Payer: Self-pay | Admitting: *Deleted

## 2023-06-04 ENCOUNTER — Telehealth: Payer: Self-pay | Admitting: Licensed Clinical Social Worker

## 2023-06-04 ENCOUNTER — Telehealth: Payer: Self-pay | Admitting: Podiatry

## 2023-06-04 NOTE — Telephone Encounter (Signed)
 Notified Teresa Golden that per lab results, she likely has a monoclonal gammopathy of unknown significance, kappa light chains may be elevated due to the MGUS or renal insufficiency.  Follow-up as scheduled. She was thankful for call and agrees to f/u.

## 2023-06-04 NOTE — Patient Outreach (Signed)
 Complex Care Management   Visit Note  06/04/2023  Name:  Teresa Golden MRN: [id number] DOB: Apr 10, 1945  Situation: Referral received for Complex Care Management related to SDOH Barriers:  Housing patient needs housing  I obtained verbal consent from patient .  Visit completed with patient on the phone  Background:   Past Medical History:  Diagnosis Date   Arthritis    Chronic kidney disease    self reports ckd stage 3    Depression    Diabetes mellitus, type II, insulin dependent (HCC)    With neurologic complications. 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    Hypertensive nephropathy 07/05/2018   Hypothyroidism    Hypothyroidism 02/06/2018   Morbid obesity with BMI of 50.0-59.9, adult [facility])    Scoliosis     Assessment: Patient Reported Symptoms:  Cognitive    Neurological      HEENT      Cardiovascular      Respiratory      Endocrine      Gastrointestinal      Genitourinary      Integumentary      Musculoskeletal      Psychosocial       There were no vitals filed for this visit.  Medications Reviewed Today   Medications were not reviewed in this encounter     Recommendation:   SW will email housing list   Follow Up Plan:   Telephone follow up appointment date/time:  06/25/2023 at 10:00 am   [name], PhD [facility], [facility] Social Worker Direct Dial: [phone]  Fax: [phone]

## 2023-06-04 NOTE — Telephone Encounter (Signed)
 DOS: 07/06/23  CAPSULOTOMY MPJ RELEASE JOINT  HAMMER TOE REPAIR  HALLUX MPJ JOINT   EFFECTIVE DATE: 02/24/22  PER THE [employer] AUTOMATED ASSISTANT NO RIOR AUTH IS REQ FOR CPT CODE [id number]  REF # [id number]

## 2023-06-08 ENCOUNTER — Telehealth: Payer: Self-pay | Admitting: Podiatry

## 2023-06-08 NOTE — Telephone Encounter (Signed)
 Pt called and is scheduled with surgery on 5/12 with Dr Lydia Sams, pt was referred by Dr Zettie Hillock. Pt is wanting to cancel surgery until she discusses it with Dr Zettie Hillock on 07/02/23.  I have cxled surgery and all post op appts

## 2023-06-09 ENCOUNTER — Ambulatory Visit (INDEPENDENT_AMBULATORY_CARE_PROVIDER_SITE_OTHER): Payer: Medicare HMO | Admitting: Psychiatry

## 2023-06-09 DIAGNOSIS — Z599 Problem related to housing and economic circumstances, unspecified: Secondary | ICD-10-CM | POA: Diagnosis not present

## 2023-06-09 DIAGNOSIS — F4323 Adjustment disorder with mixed anxiety and depressed mood: Secondary | ICD-10-CM

## 2023-06-09 DIAGNOSIS — R69 Illness, unspecified: Secondary | ICD-10-CM | POA: Diagnosis not present

## 2023-06-09 DIAGNOSIS — F331 Major depressive disorder, recurrent, moderate: Secondary | ICD-10-CM

## 2023-06-09 NOTE — Progress Notes (Signed)
 Psychotherapy Progress Note [facility] Delora Ferry, PhD LP  [name] [facility] "[name]")    MRN: [id number] Therapy format: Individual psychotherapy Date: 06/09/2023      Start: 9:15a     Stop: 10:01a     Time Spent: 46 min Location: In-person   Session narrative (presenting needs, interim history, self-report of stressors and symptoms, applications of prior therapy, status changes, and interventions made in session) Has decided against bunion surgery, feels recovery will be too limiting.  Figures at her age, she can manage with a brace and adaptive shoes instead, better to keep from taking on dfxs healing with her diabetes.  Podiatrist, in fact, has recommended against surgery, despite referring her on previous request.  [place] of [place] right now.  Hopeful of getting a lead on a handyman for her place.  Affirmed and encouraged.  Made her list of past men to acknowledge and release (burned it, as advised by ).  Warming up to write to her deceased daughter, tell her things she held back from her in life, including what she meant by giving her to her grandparents to raise.  Putting it off because it's painful.    Recalls c. Age 78 or 4 being left in a 9-room house overnight by herself, at times went out looking for her mother.  Had a special blanket with nursery rhymes that was crucial comfort.  Incident when she was alone, left the house in her pajamas, going through snow, to the neighbors' had to bang on the door a long time to get noticed, heard  Identified need to tell F he was wrong when he told her she was going to be just like her mother.   Commits to get a shower today -- she's been putting off a while and just covers it, for the most part.  Encouraged to get about it but make sure it;s not an obligation, it's honestly getting herself taken care of.    Therapeutic modalities: Cognitive Behavioral Therapy, Solution-Oriented/Positive Psychology,  Environmental manager, and Faith-sensitive  Mental Status/Observations:  Appearance:   Casual     Behavior:  Appropriate  Motor:  walker  Speech/Language:   Clear and Coherent  Affect:  Appropriate  Mood:  depressed  Thought process:  normal  Thought content:    WNL and regrets  Sensory/Perceptual disturbances:    WNL  Orientation:  Fully oriented  Attention:  Good    Concentration:  Fair  Memory:  WNL  Insight:    Good  Judgment:   Good  Impulse Control:  Good   Risk Assessment: Danger to Self: No Self-injurious Behavior: No Danger to Others: No Physical Aggression / Violence: No Duty to Warn: No Access to Firearms a concern: No  Assessment of progress:  situational setback(s)  Diagnosis:   ICD-10-CM   1. Major depressive disorder, recurrent episode, moderate (HCC)  F33.1     2. Adjustment disorder with mixed anxiety and depressed mood  F43.23     3. r/o Alcohol  dependence (psychological)  R69     4. Multiple comorbid health conditions  R69     5. Housing or economic circumstances  Z59.9      Plan:  Relationships -- Endorse cutting off dysfunctional relationships with men who use for sex, even if enjoyed, as they turn up empty.   Socialization and relationships -- Continue involvement in church and suitable groups as able.  Develop local friendships and "bonus" mother-daughter relationship at interest.  Offer to support groups such as Co[employer] or Al-Anon, and active elder facilities like [employer], [employer].  Endorse option to work part-time if truly able and interested.  Endorse choice over dating, so long as she can keep her integrity, maintain the view that she is worth working for and being honest for, and does not have to make any desperate decisions to feel loved and lovable.  Lonely does not have to be desperate.   Self-esteem -- Per her faith, continue to practice self-esteem grounded in God's unconditional love.  Self-affirm that whatever she might deem  "stupid", she has either learned, improved, or was doing the best she could figure out at the time, while trying to deal with strong and natural feelings, e.g., loneliness.  When tempted to feel like a failure for lack of family, or for acting quickly on desires, reframe self-image as a survivor who had to take on the harshest of losses and is legitimately tempted.  Acknowledge regrets when they come up, and reaffirm having learned from them.  Options to inventory wrongs a la 4th and 5th steps of AA and to write letters, or write and burn confessions, as motivated, but stay true that taking care of herself now is itself righting wrongs and enacting healthy self-esteem. General health -- Endorse any healthy activity, to include pool/gym if able and interested.  Address diabetes and renal self-care promptly and reliably.  Recommend taming light in late evening and after bed, with options to use soothing sound and orange lenses. Living situation -- Endorse researching more reliable, better suited living space, and as needed, research tenant's rights in the event landlady defaults on the mortgage.  Endorse learning about supported possible home ownership if desired.  For housekeeping, try to make a practice of smaller, more frequent efforts and/or bringing in help as needed, with the mindset that it's all a "gift to the lady who lives here".   Family -- Endorse boundary/limits with Cain Castillo.  Endorse "adoptive" family relationships where present and strong friendships as enlarged family. Other recommendations/advice -- As may be noted above.  Continue to utilize previously learned skills ad lib. Medication compliance -- Maintain medication as prescribed and work faithfully with relevant prescriber(s) if any changes are desired or seem indicated. Crisis service -- Aware of call list and work-in appts.  Call the clinic on-call service, 988/hotline, 911, or present to [facility] or ER if any life-threatening psychiatric  crisis. Followup -- Return for time as already scheduled.  Next scheduled visit with me 06/22/2023.  Next scheduled in this office 06/22/2023.  Maretta Shaper, PhD Delora Ferry, PhD LP Clinical Psychologist, [facility] [address] [place], [place] [address] [phone]

## 2023-06-11 ENCOUNTER — Ambulatory Visit: Admitting: Podiatry

## 2023-06-16 ENCOUNTER — Encounter: Payer: Self-pay | Admitting: Internal Medicine

## 2023-06-16 ENCOUNTER — Ambulatory Visit: Payer: Self-pay | Admitting: Internal Medicine

## 2023-06-16 VITALS — BP 124/80 | HR 60 | Temp 97.9°F | Ht 65.0 in | Wt 209.0 lb

## 2023-06-16 DIAGNOSIS — D472 Monoclonal gammopathy: Secondary | ICD-10-CM

## 2023-06-16 DIAGNOSIS — S8001XA Contusion of right knee, initial encounter: Secondary | ICD-10-CM

## 2023-06-16 DIAGNOSIS — M21612 Bunion of left foot: Secondary | ICD-10-CM

## 2023-06-16 DIAGNOSIS — Z87440 Personal history of urinary (tract) infections: Secondary | ICD-10-CM

## 2023-06-16 DIAGNOSIS — N390 Urinary tract infection, site not specified: Secondary | ICD-10-CM | POA: Insufficient documentation

## 2023-06-16 DIAGNOSIS — K7460 Unspecified cirrhosis of liver: Secondary | ICD-10-CM | POA: Diagnosis not present

## 2023-06-16 DIAGNOSIS — W010XXA Fall on same level from slipping, tripping and stumbling without subsequent striking against object, initial encounter: Secondary | ICD-10-CM | POA: Diagnosis not present

## 2023-06-16 LAB — POCT URINALYSIS DIP (CLINITEK)
Bilirubin, UA: NEGATIVE
Blood, UA: NEGATIVE
Glucose, UA: 500 mg/dL — AB
Ketones, POC UA: NEGATIVE mg/dL
Leukocytes, UA: NEGATIVE
Nitrite, UA: NEGATIVE
POC PROTEIN,UA: NEGATIVE
Spec Grav, UA: 1.015 (ref 1.010–1.025)
Urobilinogen, UA: 0.2 U/dL
pH, UA: 5.5 (ref 5.0–8.0)

## 2023-06-16 NOTE — Assessment & Plan Note (Signed)
 Recurrent UTI treated with antibiotics. Symptoms resolved, urine test negative. Aware of sepsis risk and taking precautions. - Continue Azo daily.

## 2023-06-16 NOTE — Progress Notes (Signed)
 I,[name], CMA,acting as a Neurosurgeon for Teresa Dung, MD.,have documented all relevant documentation on the behalf of Teresa Dung, MD,as directed by  Teresa Dung, MD while in the presence of Teresa Dung, MD.  Subjective:  [name] , female    DOB: Jul 18, 1945 , 78 y.o.   MRN: [id number]  Chief Complaint  Patient presents with   uti recheck    Patient presents today for uti recheck. She reports she went to urgent care and was prescribed antibiotics.    Pre-op Exam    Patient stated she decided not to have surgery on her foot.     HPI Discussed the use of AI scribe software for clinical note transcription with the patient, who gave verbal consent to proceed.  History of Present Illness Teresa Golden "[name]" is a 78 year old female who presents for a preoperative evaluation for left foot surgery, which she has decided to cancel.  She was initially scheduled for surgery on her left foot to address a bunion and hammer toe but decided against it due to concerns about the procedure and past surgical experiences. Although her insurance covers transportation and the surgery was to be outpatient, she remains apprehensive about the procedure.  She recently had a urinary tract infection diagnosed by a kidney specialist at [facility], for which she was prescribed antibiotics. Despite completing the medication, symptoms persisted, leading her to seek further treatment at urgent care on April 5th, where she was given additional medication. She feels much better now, with no current symptoms of urinary tract infection such as burning or pressure during urination. She uses Azo daily and drinks pure cranberry juice.  She has a history of monoclonal gammopathy of undetermined significance and is monitored by a hematologist every six months. Recent evaluations have not shown any concerning markers.  She experienced a fall last week while rushing to watch a TV program,  resulting in a bruise on her knee. She tripped over a threshold between rooms and managed to get up on her own. No head injury occurred during the fall, but she experienced soreness and bruising on her right side. She uses Voltaren  gel for knee discomfort and has ordered a wristwatch alert system for safety in case of future falls.  She has a history of cirrhosis and was previously monitored by [facility], but stopped due to personal reasons. She acknowledges the importance of regular follow-up for her liver condition and is willing to resume monitoring. She was treated successfully for hepatitis C in the past. Her kidney function does not currently warrant concern for dialysis. She is making efforts to drink more water .  She is currently under the care of a psychologist, whom she sees every two weeks for therapy. She plans to resume water  aerobics at the [facility] to improve her energy levels and manage weight gain.      Past Medical History:  Diagnosis Date   Arthritis    Chronic kidney disease    self reports ckd stage 3    Depression    Diabetes mellitus, type II, insulin  dependent (HCC)    With neurologic complications. 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  Hypertensive nephropathy 07/05/2018   Hypothyroidism    Hypothyroidism 02/06/2018   Morbid obesity with BMI of 50.0-59.9, adult (HCC)    Scoliosis      Family History  Problem Relation Age of Onset   Heart attack Mother    Heart failure Mother    Stroke Father      Current Outpatient Medications:    allopurinol  (ZYLOPRIM ) 100 MG tablet, Take 1 tablet (100 mg total) by mouth daily., Disp: 90 tablet, Rfl: 2   amLODipine  (NORVASC ) 5 MG tablet, Take 1 tablet by mouth once daily, Disp:  90 tablet, Rfl: 0   AZO-CRANBERRY PO, Take 1 tablet by mouth daily., Disp: , Rfl:    B Complex-C (B-COMPLEX WITH VITAMIN C) tablet, Take 1 tablet by mouth 3 (three) times a week., Disp: , Rfl:    betamethasone  dipropionate 0.05 % cream, Apply topically 2 (two) times daily., Disp: 30 g, Rfl: 0   Biotin 16109 MCG TABS, Take 10,000 mcg by mouth daily., Disp: , Rfl:    cetirizine  (ZYRTEC ) 10 MG tablet, Take 1 tablet (10 mg total) by mouth daily. (Patient taking differently: Take 10 mg by mouth daily as needed for allergies.), Disp: 30 tablet, Rfl: 1   Cholecalciferol 5000 units TABS, Take 5,000 Units by mouth daily., Disp: , Rfl:    dapagliflozin  propanediol (FARXIGA ) 10 MG TABS tablet, Take 1 tablet (10 mg total) by mouth daily., Disp: 90 tablet, Rfl: 3   diclofenac  Sodium (VOLTAREN ) 1 % GEL, APPLY 2 GRAMS TO AFFECTED AREA(S) 4 TIMES DAILY AS NEEDED (Patient taking differently: Apply 2-4 g topically every 6 (six) hours as needed (to affected areas).), Disp: 200 g, Rfl: 2   gabapentin  (NEURONTIN ) 300 MG capsule, Take 2 capsules (600 mg total) by mouth at bedtime., Disp: 180 capsule, Rfl: 2   glucose blood (ONETOUCH VERIO) test strip, USE TO CHECK BLOOD SUGAR   TWO TIMES A DAY AS         INSTRUCTED, Disp: 200 strip, Rfl: 3   Insulin  Degludec (TRESIBA ) 100 UNIT/ML SOLN, Inject 10 Units/oz/day into the skin at bedtime. Patient is taking at bedtime, Disp: , Rfl:    Lancets (ONETOUCH DELICA PLUS LANCET33G) MISC, USE TO CHECK BLOOD SUGARS  TWO TIMES A DAY AS         INSTRUCTED, Disp: 200 each, Rfl: 3   levothyroxine  (SYNTHROID ) 25 MCG tablet, Take 1/2 tablet daily., Disp: 90 tablet, Rfl: 1   losartan  (COZAAR ) 100 MG tablet, TAKE 1 TABLET DAILY, Disp: 90 tablet, Rfl: 2   metoprolol  succinate (TOPROL -XL) 25 MG 24 hr tablet, TAKE 1 TABLET AT BEDTIME   (DISCONTINUE COREG ), Disp: 90 tablet, Rfl: 1   Multiple Vitamins-Minerals (MULTIVITAMIN WITH MINERALS) tablet, Take 1 tablet by mouth daily. Women's 50+, Disp: ,  Rfl:    pantoprazole  (PROTONIX ) 40 MG tablet, Take 40 mg by mouth daily as needed., Disp: , Rfl:    Semaglutide , 1 MG/DOSE, (OZEMPIC , 1 MG/DOSE,) 4 MG/3ML SOPN, Inject 1 mg into the skin once a week., Disp: 9 mL, Rfl: 1   simvastatin  (ZOCOR ) 10 MG tablet, Take 1 tablet by mouth once daily, Disp: 90 tablet, Rfl: 2   triamcinolone ointment (KENALOG) 0.1 %, Apply 1 Application topically 2 (two) times daily., Disp: , Rfl:    Allergies  Allergen Reactions   Meloxicam Other (See Comments)    Fever; muscle aches; "flu-like" symptoms "Allergic," per MAR   Grass Pollen(K-O-R-T-Swt Vern) Other (See Comments)    Sinus inflammation  Other Other (See Comments)    "Rose fever and hay fever"     Review of Systems  Constitutional: Negative.   Respiratory: Negative.    Cardiovascular: Negative.   Gastrointestinal: Negative.   Musculoskeletal:  Positive for arthralgias.  Neurological: Negative.   Psychiatric/Behavioral: Negative.       Today's Vitals   06/16/23 1409  BP: 124/80  Pulse: 60  Temp: 97.9 F (36.6 C)  TempSrc: Oral  Weight: 209 lb (94.8 kg)  Height: 5\' 5"  (1.651 m)  PainSc: 0-No pain   Body mass index is 34.78 kg/m.  Wt Readings from Last 3 Encounters:  06/16/23 209 lb (94.8 kg)  05/28/23 205 lb 4.8 oz (93.1 kg)  04/08/23 202 lb 3.2 oz (91.7 kg)     Objective:  Physical Exam Vitals and nursing note reviewed.  Constitutional:      Appearance: Normal appearance. She is obese.  HENT:     Head: Normocephalic and atraumatic.  Eyes:     Extraocular Movements: Extraocular movements intact.  Cardiovascular:     Rate and Rhythm: Normal rate and regular rhythm.     Heart sounds: Normal heart sounds.  Pulmonary:     Effort: Pulmonary effort is normal.     Breath sounds: Normal breath sounds.  Musculoskeletal:     Cervical back: Normal range of motion.     Comments: Ambulatory w/ walker  Skin:    General: Skin is warm.  Neurological:     General: No focal deficit  present.     Mental Status: She is alert.  Psychiatric:        Mood and Affect: Mood normal.        Behavior: Behavior normal.         Assessment And Plan:  Bunion of left foot Assessment & Plan: Chronic, she has decided to not move forward with foot surgery. F/u as per Podiatry.    MGUS (monoclonal gammopathy of unknown significance) Assessment & Plan: MGUS monitored by hematologist. No new concerning markers. - Continue follow-up with hematologist every six months.   Cirrhosis of liver without ascites, unspecified hepatic cirrhosis type (HCC) Assessment & Plan: Cirrhosis with history of hepatitis C. Last ultrasound showed no concerning findings. Agreed to resume monitoring. - Order AFP and PT tests to monitor liver function. - Emphasized importance of regular follow-up with liver specialist.  Orders: -     AFP tumor marker -     Gamma GT -     Protime-INR  Fall on same level from slipping, tripping or stumbling, initial encounter Assessment & Plan: Recent fall with knee contusion. Occourred on April 17th. No head injury. It would be a great idea to order an alert wristwatch for safety. - Apply Voltaren  gel to knee for pain relief.   Contusion of right knee, initial encounter Assessment & Plan: Fall occurred approximately on 4/15, should get some relief with applying ice over area in question. She will let me know if the discomfort persists.    History of UTI Assessment & Plan: Recurrent UTI treated with antibiotics. Symptoms resolved, urine test negative. Aware of sepsis risk and taking precautions. - Continue Azo daily.  Orders: -     POCT URINALYSIS DIP (CLINITEK)  Return if symptoms worsen or fail to improve.  Patient was given opportunity to ask questions. Patient verbalized understanding of the plan and was able to repeat key elements of the plan. All questions were answered to their satisfaction.    I, Bobetta Burrows  Elnita Hai, MD, have reviewed all documentation  for this visit. The documentation on 06/16/23 for the exam, diagnosis, procedures, and orders are all accurate and complete.   IF YOU HAVE BEEN REFERRED TO A SPECIALIST, IT MAY TAKE 1-2 WEEKS TO SCHEDULE/PROCESS THE REFERRAL. IF YOU HAVE NOT HEARD FROM US /SPECIALIST IN TWO WEEKS, PLEASE GIVE US  A CALL AT [phone] X [phone].   THE PATIENT IS ENCOURAGED TO PRACTICE SOCIAL DISTANCING DUE TO THE COVID-19 PANDEMIC.

## 2023-06-16 NOTE — Assessment & Plan Note (Signed)
 Cirrhosis with history of hepatitis C. Last ultrasound showed no concerning findings. Agreed to resume monitoring. - Order AFP and PT tests to monitor liver function. - Emphasized importance of regular follow-up with liver specialist.

## 2023-06-16 NOTE — Assessment & Plan Note (Signed)
 Recent fall with knee contusion. Occourred on April 17th. No head injury. It would be a great idea to order an alert wristwatch for safety. - Apply Voltaren  gel to knee for pain relief.

## 2023-06-17 LAB — AFP TUMOR MARKER: AFP, Serum, Tumor Marker: 4 ng/mL (ref 0.0–9.2)

## 2023-06-17 LAB — PROTIME-INR
INR: 1 (ref 0.9–1.2)
Prothrombin Time: 11.1 s (ref 9.1–12.0)

## 2023-06-17 LAB — GAMMA GT: GGT: 15 IU/L (ref 0–60)

## 2023-06-18 ENCOUNTER — Ambulatory Visit: Payer: Medicare HMO

## 2023-06-18 NOTE — Patient Outreach (Signed)
 Complex Care Management   Visit Note  06/18/2023  Name:  Teresa Golden MRN: [id number] DOB: 1945-06-19  Situation: Referral received for Complex Care Management related to Diabetes with Complications and Chronic Kidney Disease I obtained verbal consent from Patient.  Visit completed with patient on the phone.  Background:   Past Medical History:  Diagnosis Date   Arthritis    Chronic kidney disease    self reports ckd stage 3    Depression    Diabetes mellitus, type II, insulin  dependent (HCC)    With neurologic complications. 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    Hypertensive nephropathy 07/05/2018   Hypothyroidism    Hypothyroidism 02/06/2018   Morbid obesity with BMI of 50.0-59.9, adult (HCC)    Scoliosis     Assessment: Patient Reported Symptoms:  Cognitive Cognitive Status: Alert and oriented to person, place, and time Cognitive/Intellectual Conditions Management [RPT]: Behavior Disorders Behavior Disorders: Adjustment disorder   Health Maintenance Behaviors: Annual physical exam, Immunizations, Healthy diet, Stress management Healing Pattern: Average Health Facilitated by: Healthy diet, Rest, Stress management  Neurological Neurological Review of Symptoms: No symptoms reported    HEENT HEENT Symptoms Reported: No symptoms reported      Cardiovascular Cardiovascular Symptoms Reported: No symptoms reported Does patient have uncontrolled Hypertension?: No Cardiovascular Conditions: Hypertension Cardiovascular Management Strategies: Medication therapy, Routine screening  Respiratory Respiratory Symptoms Reported: No symptoms reported Respiratory Conditions: Sleep disordered breathing Respiratory Self-Management Outcome: 4  (good)  Endocrine Patient reports the following symptoms related to hypoglycemia or hyperglycemia : No symptoms reported Is patient diabetic?: Yes Is patient checking blood sugars at home?: Yes Endocrine Conditions: Diabetes, Thyroid  disorder Endocrine Management Strategies: Medication therapy, Routine screening, Diet modification Endocrine Self-Management Outcome: 4 (good)  Gastrointestinal Gastrointestinal Symptoms Reported: Constipation Gastrointestinal Conditions: Constipation, Reflux/heartburn Gastrointestinal Management Strategies: Fluid modification, Medication therapy Gastrointestinal Self-Management Outcome: 4 (good) Nutrition Risk Screen (CP): No indicators present  Genitourinary Genitourinary Symptoms Reported: No symptoms reported Additional Genitourinary Details: completed antibiotic for UTI, symptoms resolved Genitourinary Conditions: Urinary tract infection Genitourinary Management Strategies: Medication therapy, Fluid modification Genitourinary Self-Management Outcome: 4 (good)  Integumentary Integumentary Symptoms Reported: No symptoms reported    Musculoskeletal Musculoskelatal Symptoms Reviewed: Other Other Musculoskeletal Symptoms: bunion of left foot Musculoskeletal Conditions: Other Other Musculoskeletal Conditions: bunion of left foot Musculoskeletal Management Strategies: Routine screening Musculoskeletal Self-Management Outcome: 4 (good) Falls in the past year?: Yes Number of falls in past year: 1 or less Was there an injury with Fall?: Yes Fall Risk Category Calculator: 2 Patient Fall Risk Level: Moderate Fall Risk Patient at Risk for Falls Due to: Other (Comment) (patient states she was in a hurry and moving too quickly) Fall risk Follow up: Education provided, Falls evaluation completed, Falls prevention discussed  Psychosocial Psychosocial Symptoms Reported: Depression - if selected complete PHQ 2-9 Behavioral Health Conditions: Depression Behavioral  Management Strategies: Support system, Counseling, Coping strategies Behavioral Health Self-Management Outcome: 4 (good) Major Change/Loss/Stressor/Fears (CP): Denies Techniques to Cope with Loss/Stress/Change: Counseling, Spiritual practice(s), Support group Quality of Family Relationships: helpful, supportive Do you feel physically threatened by others?: No      06/18/2023   11:32 AM  Depression screen PHQ 2/9  Decreased Interest 0  Down, Depressed, Hopeless  0  PHQ - 2 Score 0  Difficult doing work/chores Not difficult at all    Vitals:   06/18/23 1102  BP: 126/74    Medications Reviewed Today     Reviewed by Kaylene Pascal, RN (Registered Nurse) on 06/18/23 at 1121  Med List Status: <None>   Medication Order Taking? Sig Documenting Provider Last Dose Status Informant  allopurinol  (ZYLOPRIM ) 100 MG tablet [id number] Yes Take 1 tablet (100 mg total) by mouth daily. Melodie Spry, NP Taking Active   amLODipine  (NORVASC ) 5 MG tablet [id number] Yes Take 1 tablet by mouth once daily Cleave Curling, MD Taking Active   AZO-CRANBERRY PO [id number] Yes Take 1 tablet by mouth daily. [provider] Taking Active Self  B Complex-C (B-COMPLEX WITH VITAMIN C) tablet [id number] Yes Take 1 tablet by mouth 3 (three) times a week. [provider] Taking Active Nursing Home Medication Administration Guide (MAG)  betamethasone  dipropionate 0.05 % cream [id number]  Apply topically 2 (two) times daily. Melodie Spry, NP  Consider Medication Status and Discontinue (Completed Course)   Biotin 38756 MCG TABS [id number]  Take 10,000 mcg by mouth daily. [provider]  Consider Medication Status and Discontinue (Patient Preference) Nursing Home Medication Administration Guide (MAG)  cetirizine  (ZYRTEC ) 10 MG tablet [id number] Yes Take 1 tablet (10 mg total) by mouth daily.  Patient taking differently: Take 10 mg by mouth daily as needed for allergies.   Melodie Spry, NP Taking Active    Cholecalciferol 5000 units TABS [id number] Yes Take 5,000 Units by mouth daily. [provider] Taking Active Nursing Home Medication Administration Guide (MAG)           Med Note Nolan Battle, [facility] I   Fri Aug 07, 2017  6:51 PM)    dapagliflozin  propanediol (FARXIGA ) 10 MG TABS tablet [id number] Yes Take 1 tablet (10 mg total) by mouth daily. Cleave Curling, MD Taking Active   diclofenac  Sodium (VOLTAREN ) 1 % GEL [id number] Yes APPLY 2 GRAMS TO AFFECTED AREA(S) 4 TIMES DAILY AS NEEDED  Patient taking differently: Apply 2-4 g topically every 6 (six) hours as needed (to affected areas).   Cleave Curling, MD Taking Active   gabapentin  (NEURONTIN ) 300 MG capsule [id number] Yes Take 2 capsules (600 mg total) by mouth at bedtime. Sikora, Rebecca, DPM Taking Active   glucose blood (ONETOUCH VERIO) test strip [id number]  USE TO CHECK BLOOD SUGAR   TWO TIMES A DAY AS         INSTRUCTED Cleave Curling, MD  Active   Insulin  Degludec (TRESIBA ) 100 UNIT/ML SOLN [id number] Yes Inject 10 Units/oz/day into the skin at bedtime. Patient is taking at bedtime [provider] Taking Active   Lancets [facility] Jewelene Morton PLUS [place]) MISC [id number]  USE TO CHECK BLOOD SUGARS  TWO TIMES A DAY AS         INSTRUCTED Cleave Curling, MD  Active   levothyroxine  (SYNTHROID ) 25 MCG tablet [id number] Yes Take 1/2 tablet daily. Cleave Curling, MD Taking Active   losartan  (COZAAR ) 100 MG tablet [id number] Yes TAKE 1 TABLET DAILY Cleave Curling, MD Taking Active   metoprolol  succinate (TOPROL -XL) 25 MG 24 hr tablet [id number] Yes TAKE 1 TABLET AT BEDTIME   (DISCONTINUE COREG ) Cleave Curling, MD Taking Active   Multiple Vitamins-Minerals (MULTIVITAMIN WITH MINERALS) tablet [id number] Yes Take 1 tablet by mouth daily. Women's 50+ [provider] Taking Active Nursing Home Medication Administration Guide (MAG)           Med Note (ABDAL-RAFI, [facility]  I   Fri Aug 07, 2017  6:53 PM)    pantoprazole  (PROTONIX ) 40 MG  tablet [id number] Yes Take 40 mg by mouth daily as needed. [provider] Taking Active   Semaglutide , 1 MG/DOSE, (OZEMPIC , 1 MG/DOSE,) 4 MG/3ML SOPN [id number] Yes Inject 1 mg into the skin once a week. Cleave Curling, MD Taking Active    Patient not taking:   Discontinued 11/06/20 1600 (Reorder) simvastatin  (ZOCOR ) 10 MG tablet [id number] Yes Take 1 tablet by mouth once daily Cleave Curling, MD Taking Active   triamcinolone ointment (KENALOG) 0.1 % [id number]  Apply 1 Application topically 2 (two) times daily. [provider]  Consider Medication Status and Discontinue (Patient Preference)   Med List Note Thurman Flores, Sandi Crosby 11/02/20 2136): Admitted to [facility] on 10/22/2020 Donovan Gallant) [place], [place]            Recommendation:   PCP Follow-up  Follow Up Plan:   Telephone follow up appointment date/time:  07/16/23 @10 :30 AM  Louanne Roussel RN BSN CCM Miguel Barrera  [facility], [facility] Nurse Care Coordinator  Direct Dial: [phone] Website: [email or web address]

## 2023-06-18 NOTE — Patient Instructions (Signed)
 Visit Information  Thank you for taking time to visit with me today. Please don't hesitate to contact me if I can be of assistance to you before our next scheduled appointment.  Our next appointment is by telephone on 07/16/23 at 10:30 AM Please call the care guide team at [phone] if you need to cancel or reschedule your appointment.   Following is a copy of your care plan:   Goals Addressed               This Visit's Progress     Patient Stated     COMPLETED: I want to lower my A1c to 6.0 (pt-stated)        Care Coordination Interventions: See new goal       Other     COMPLETED: I want to work on building my muscle mass        Care Coordination Interventions: Evaluation of current treatment plan related to physical health and patient's adherence to plan as established by provider Discussed with patient she is ready to be more active and establish a routine exercise regimen Discussed patient's plans to attend the [employer] near her home this weekend in order to get started  Positive reinforcement provided to patient for making healthy choices and lifestyle changes in order to stay healthy      COMPLETED: To consider starting an antidepressant        Care Coordination Interventions: Evaluation of current treatment plan related to depression and patient's adherence to plan as established by provider Determined patient continues to receive counseling every 2 weeks and is finding this to be very effective  Discussed patient plans to resume her exercise regimen at the [facility], today is a good day, she has some good relationships with church friends and is feeling hopeful  Instructed patient to keep her doctor informed of new symptoms or concerns       COMPLETED: To improve kidney function        Care Coordination Interventions: See new goal       COMPLETED: To undergo foot surgery without complications        Care Coordination Interventions: Evaluation of current treatment plan  related to Left first MPJ arthrodesis with second hammertoe/ 3rd hammertoe and patient's adherence to plan as established by provider Determined patient has not elected to undergo surgery for her bunion at this time  Discussed patient will continue to have routine screening and keep her doctor informed of new symptoms or concerns      VBCI RN Care Plan related to Chronic Kidney disease        Problems:  Chronic Disease Management support and education needs related to CKD Stage 3a  Goal: Over the next 60 days the Patient will continue to work with Medical illustrator and/or Social Worker to address care management and care coordination needs related to CKD Stage 3a as evidenced by adherence to care management team scheduled appointments      Interventions:    Chronic Kidney Disease Interventions: Assessed the Patient understanding of chronic kidney disease    Evaluation of current treatment plan related to chronic kidney disease self management and patient's adherence to plan as established by provider      Reviewed prescribed diet daily water  intake of 48-64 oz daily unless otherwise directed Advised patient to discuss persistent UTI's  with provider    Provided education on kidney disease progression    Engage patient in early, proactive and ongoing discussion about goals  of care and what matters most to them    Last practice recorded BP readings:  BP Readings from Last 3 Encounters:  06/18/23 126/74  06/16/23 124/80  05/30/23 (!) 145/76   Most recent eGFR/CrCl:  Lab Results  Component Value Date   EGFR 46.0 05/11/2023    No components found for: "CRCL"  Patient Self-Care Activities:  Attend all scheduled provider appointments Attend church or other social activities Call pharmacy for medication refills 3-7 days in advance of running out of medications Call provider office for new concerns or questions  Perform all self care activities independently  Perform IADL's (shopping,  preparing meals, housekeeping, managing finances) independently Take medications as prescribed   Work with the social worker to address care coordination needs and will continue to work with the clinical team to address health care and disease management related needs  Plan:  Next PCP appointment scheduled for: 09/01/23 @10 :20 AM Telephone follow up appointment with care management team member scheduled for:  07/16/23 @10 :30 AM           VBCI RN Care Plan related to type 2 diabetes        Problems:  Chronic Disease Management support and education needs related to DMII  Goal: Over the next 60 days the Patient will continue to work with Medical illustrator and/or Social Worker to address care management and care coordination needs related to DMII as evidenced by adherence to care management team scheduled appointments      Interventions:   Diabetes Interventions: Assessed patient's understanding of A1c goal: <6.5% Provided education to patient about basic DM disease process Reviewed medications with patient and discussed importance of medication adherence Review of patient status, including review of consultants reports, relevant laboratory and other test results, and medications completed Lab Results  Component Value Date   HGBA1C 6.5 (H) 04/08/2023    Patient Self-Care Activities:  Attend all scheduled provider appointments Attend church or other social activities Call pharmacy for medication refills 3-7 days in advance of running out of medications Call provider office for new concerns or questions  Perform all self care activities independently  Perform IADL's (shopping, preparing meals, housekeeping, managing finances) independently Take medications as prescribed   Work with the social worker to address care coordination needs and will continue to work with the clinical team to address health care and disease management related needs check blood sugar at prescribed times: before  meals and at bedtime and when you have symptoms of low or high blood sugar check feet daily for cuts, sores or redness drink 6 to 8 glasses of water  each day manage portion size Establish a routine exercise regimen   Plan:  Next PCP appointment scheduled for: 09/01/23 @10 :20 AM Telephone follow up appointment with care management team member scheduled for: 07/16/23 @10 :30 AM             Please call 1-800-273-TALK (toll free, 24 hour hotline) if you are experiencing a Mental Health or Behavioral Health Crisis or need someone to talk to.  Patient verbalizes understanding of instructions and care plan provided today and agrees to view in MyChart. Active MyChart status and patient understanding of how to access instructions and care plan via MyChart confirmed with patient.     Louanne Roussel RN BSN CCM [place]  [facility], [facility] Health Nurse Care Coordinator  Direct Dial: [phone] Website: [email or web address]

## 2023-06-19 ENCOUNTER — Ambulatory Visit: Payer: Medicare HMO | Admitting: Podiatry

## 2023-06-19 NOTE — Addendum Note (Signed)
 Addended by: Coni Deep B on: 06/19/2023 04:32 PM   Modules accepted: Level of Service

## 2023-06-22 ENCOUNTER — Encounter: Payer: Self-pay | Admitting: Internal Medicine

## 2023-06-22 ENCOUNTER — Ambulatory Visit (INDEPENDENT_AMBULATORY_CARE_PROVIDER_SITE_OTHER): Payer: Medicare HMO | Admitting: Psychiatry

## 2023-06-22 DIAGNOSIS — Z599 Problem related to housing and economic circumstances, unspecified: Secondary | ICD-10-CM | POA: Diagnosis not present

## 2023-06-22 DIAGNOSIS — R69 Illness, unspecified: Secondary | ICD-10-CM

## 2023-06-22 DIAGNOSIS — F3340 Major depressive disorder, recurrent, in remission, unspecified: Secondary | ICD-10-CM

## 2023-06-22 DIAGNOSIS — F4323 Adjustment disorder with mixed anxiety and depressed mood: Secondary | ICD-10-CM

## 2023-06-22 NOTE — Progress Notes (Signed)
 Psychotherapy Progress Note [facility] Delora Ferry, PhD LP  [name] [facility] "[name]")    MRN: [id number] Therapy format: Individual psychotherapy Date: 06/22/2023      Start: 9:19a     Stop: 9:57a     Time Spent: 38 min Location: In-person   Session narrative (presenting needs, interim history, self-report of stressors and symptoms, applications of prior therapy, status changes, and interventions made in session) Conscious of energy bill lately, goes into lengthy explanation of how thermostats here aren't as advanced as the ones in [place].  Housing class in [place] cancelled due to damage to the building, went without knowing it.  Now interested in attending ib [place] or [place], but will need ride service to make it.  Questioned the value of it, says it's a program for home ownership without large downpayment.  Working out arrangements.  Doing OK, mood and moralewise.  Has not written the letter to daughter, but felt substantially better after just going on and getting a shower as discussed last time.  Been thinking better that she can go on without blaming herself, or being haunted by the idea she would turn out like her mother.  Affirmed and encouraged, including ongoing options to write and release regrets or unfinished emotional business.  Says she feels more assertive, and more the right to be.  Encouraged in self-validation and balancing doing right by herself and others without falling for distortions that diminish both.  Figuring out more about her change of churches.  Not sure why she left [facility], now with [facility].  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  Discussed the sometimes abrasive experience of people seeing her on her walker and offering help.  Already on the idea of giving them a no thank you and explaining she wants the exercise.  Briefly brainstormed  other good-natured comebacks if needed.  Abbreviated today due to pressing appointment elsewhere and SCAT coming early.  Therapeutic modalities: Cognitive Behavioral Therapy, Solution-Oriented/Positive Psychology, Environmental manager, and Faith-sensitive  Mental Status/Observations:  Appearance:   Casual     Behavior:  Appropriate  Motor:  Normal and walker  Speech/Language:   Clear and Coherent  Affect:  Appropriate  Mood:  normal  Thought process:  normal  Thought content:    WNL  Sensory/Perceptual disturbances:    WNL  Orientation:  Fully oriented  Attention:  Good    Concentration:  Good  Memory:  WNL  Insight:    Good  Judgment:   Variable  Impulse Control:  Good   Risk Assessment: Danger to Self: No Self-injurious Behavior: No Danger to Others: No Physical Aggression / Violence: No Duty to Warn: No Access to Firearms a concern: No  Assessment of progress:  progressing well  Diagnosis:   ICD-10-CM   1. MDD (recurrent major depressive disorder) in remission (HCC)  F33.40     2. Adjustment disorder with mixed anxiety and depressed mood  F43.23     3. r/o Alcohol  dependence (psychological)  R69     4. Multiple comorbid health conditions  R69     5. Housing or economic circumstances  Z59.9      Plan:  Relationships -- Endorse cutting off dysfunctional relationships with men who use for sex, even if enjoyed, as they turn up empty.   Socialization and relationships -- Continue involvement in church and suitable groups as able.  Develop local friendships  and "bonus" mother-daughter relationship at interest.  Offer to support groups such as CoDA or Al-Anon, and active elder facilities like [employer], [employer].  Endorse option to work part-time if truly able and interested.  Endorse choice over dating, so long as she can keep her integrity, maintain the view that she is worth working for and being honest for, and does not have to make any desperate decisions  to feel loved and lovable.  Lonely does not have to be desperate.   Self-esteem -- Per her faith, continue to practice self-esteem grounded in God's unconditional love.  Self-affirm that whatever she might deem "stupid", she has either learned, improved, or was doing the best she could figure out at the time, while trying to deal with strong and natural feelings, e.g., loneliness.  When tempted to feel like a failure for lack of family, or for acting quickly on desires, reframe self-image as a survivor who had to take on the harshest of losses and is legitimately tempted.  Acknowledge regrets when they come up, and reaffirm having learned from them.  Options to inventory wrongs a la 4th and 5th steps of AA and to write letters, or write and burn confessions, as motivated, but stay true that taking care of herself now is itself righting wrongs and enacting healthy self-esteem. General health -- Endorse any healthy activity, to include pool/gym if able and interested.  Address diabetes and renal self-care promptly and reliably.  Recommend taming light in late evening and after bed, with options to use soothing sound and orange lenses. Living situation -- Endorse researching more reliable, better suited living space, and as needed, research tenant's rights in the event landlady defaults on the mortgage.  Endorse learning about supported possible home ownership if desired.  For housekeeping, try to make a practice of smaller, more frequent efforts and/or bringing in help as needed, with the mindset that it's all a "gift to the lady who lives here".   Family -- Endorse boundary/limits with Cain Castillo.  Endorse "adoptive" family relationships where present and strong friendships as enlarged family. Other recommendations/advice -- As may be noted above.  Continue to utilize previously learned skills ad lib. Medication compliance -- Maintain medication as prescribed and work faithfully with relevant prescriber(s) if any  changes are desired or seem indicated. Crisis service -- Aware of call list and work-in appts.  Call the clinic on-call service, 988/hotline, 911, or present to [facility] or ER if any life-threatening psychiatric crisis. Followup -- Return for time as already scheduled.  Next scheduled visit with me 07/07/2023.  Next scheduled in this office 07/07/2023.  Maretta Shaper, PhD Delora Ferry, PhD LP Clinical Psychologist, [facility] [address], [address] [place], [place] [address] [phone]

## 2023-06-25 ENCOUNTER — Encounter: Payer: Medicare HMO | Admitting: Internal Medicine

## 2023-06-25 ENCOUNTER — Other Ambulatory Visit: Payer: Self-pay | Admitting: Licensed Clinical Social Worker

## 2023-06-25 ENCOUNTER — Telehealth: Payer: Self-pay | Admitting: Licensed Clinical Social Worker

## 2023-06-25 NOTE — Patient Outreach (Signed)
 Complex Care Management   Visit Note  06/25/2023  Name:  Teresa Golden MRN: [id number] DOB: 01/08/1946  Situation: Referral received for Complex Care Management related to  patient wants the PCP to writ an order for a new rollator  I obtained verbal consent from Patient.  Visit completed with patient  on the phone  Background:   Past Medical History:  Diagnosis Date   Arthritis    Chronic kidney disease    self reports ckd stage 3    Depression    Diabetes mellitus, type II, insulin  dependent (HCC)    With neurologic complications. 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    Hypertensive nephropathy 07/05/2018   Hypothyroidism    Hypothyroidism 02/06/2018   Morbid obesity with BMI of 50.0-59.9, adult [facility])    Scoliosis     Assessment: Patient called back to ask the SW to contact the PCP for an order for a new rollator   Recommendation:   DME requests:  walker other new rollators  Follow Up Plan:   Closing From:  Complex Care Management  Jonda Neighbours, PhD [facility], [facility] Social Worker Direct Dial: [phone]  Fax: [phone]

## 2023-06-25 NOTE — Patient Instructions (Signed)

## 2023-06-25 NOTE — Patient Outreach (Signed)
 Complex Care Management   Visit Note  06/25/2023  Name:  Teresa Golden MRN: [id number] DOB: [birth year]  Situation: Referral received for Complex Care Management related to SDOH Barriers:  Housing patient wants to own a home  I obtained verbal consent from Patient.  Visit completed with patient  on the phone  Background:   Past Medical History:  Diagnosis Date   Arthritis    Chronic kidney disease    self reports ckd stage 3    Depression    Diabetes mellitus, type II, insulin  dependent (HCC)    With neurologic complications. 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    Hypertensive nephropathy 07/05/2018   Hypothyroidism    Hypothyroidism 02/06/2018   Morbid obesity with BMI of 50.0-59.9, adult [facility])    Scoliosis     Assessment: Patient is going to a home buyer program in [place] on 07/10/2023, the one is Jonette Nestle was cancelled. The patient will get another referral if needed in the future for the SW    Recommendation:   Patient is seeking information on owning a home   Follow Up Plan:   Closing From:  Complex Care Management  Jonda Neighbours, PhD [facility], [facility] Social Worker Direct Dial: [phone]  Fax: [phone]

## 2023-06-26 ENCOUNTER — Telehealth: Payer: Self-pay

## 2023-06-26 NOTE — Patient Instructions (Signed)
 Fall Prevention in the Home, Adult Falls can cause injuries and can happen to people of all ages. There are many things you can do to make your home safer and to help prevent falls. What actions can I take to prevent falls? General information Use good lighting in all rooms. Make sure to: Replace any light bulbs that burn out. Turn on the lights in dark areas and use night-lights. Keep items that you use often in easy-to-reach places. Lower the shelves around your home if needed. Move furniture so that there are clear paths around it. Do not use throw rugs or other things on the floor that can make you trip. If any of your floors are uneven, fix them. Add color or contrast paint or tape to clearly mark and help you see: Grab bars or handrails. First and last steps of staircases. Where the edge of each step is. If you use a ladder or stepladder: Make sure that it is fully opened. Do not climb a closed ladder. Make sure the sides of the ladder are locked in place. Have someone hold the ladder while you use it. Know where your pets are as you move through your home. What can I do in the bathroom?     Keep the floor dry. Clean up any water on the floor right away. Remove soap buildup in the bathtub or shower. Buildup makes bathtubs and showers slippery. Use non-skid mats or decals on the floor of the bathtub or shower. Attach bath mats securely with double-sided, non-slip rug tape. If you need to sit down in the shower, use a non-slip stool. Install grab bars by the toilet and in the bathtub and shower. Do not use towel bars as grab bars. What can I do in the bedroom? Make sure that you have a light by your bed that is easy to reach. Do not use any sheets or blankets on your bed that hang to the floor. Have a firm chair or bench with side arms that you can use for support when you get dressed. What can I do in the kitchen? Clean up any spills right away. If you need to reach something  above you, use a step stool with a grab bar. Keep electrical cords out of the way. Do not use floor polish or wax that makes floors slippery. What can I do with my stairs? Do not leave anything on the stairs. Make sure that you have a light switch at the top and the bottom of the stairs. Make sure that there are handrails on both sides of the stairs. Fix handrails that are broken or loose. Install non-slip stair treads on all your stairs if they do not have carpet. Avoid having throw rugs at the top or bottom of the stairs. Choose a carpet that does not hide the edge of the steps on the stairs. Make sure that the carpet is firmly attached to the stairs. Fix carpet that is loose or worn. What can I do on the outside of my home? Use bright outdoor lighting. Fix the edges of walkways and driveways and fix any cracks. Clear paths of anything that can make you trip, such as tools or rocks. Add color or contrast paint or tape to clearly mark and help you see anything that might make you trip as you walk through a door, such as a raised step or threshold. Trim any bushes or trees on paths to your home. Check to see if handrails are loose  or broken and that both sides of all steps have handrails. Install guardrails along the edges of any raised decks and porches. Have leaves, snow, or ice cleared regularly. Use sand, salt, or ice melter on paths if you live where there is ice and snow during the winter. Clean up any spills in your garage right away. This includes grease or oil spills. What other actions can I take? Review your medicines with your doctor. Some medicines can cause dizziness or changes in blood pressure, which increase your risk of falling. Wear shoes that: Have a low heel. Do not wear high heels. Have rubber bottoms and are closed at the toe. Feel good on your feet and fit well. Use tools that help you move around if needed. These include: Canes. Walkers. Scooters. Crutches. Ask  your doctor what else you can do to help prevent falls. This may include seeing a physical therapist to learn to do exercises to move better and get stronger. Where to find more information Centers for Disease Control and Prevention, STEADI: TonerPromos.no [employer] on Aging: BaseRingTones.pl National Institute on Aging: BaseRingTones.pl Contact a doctor if: You are afraid of falling at home. You feel weak, drowsy, or dizzy at home. You fall at home. Get help right away if you: Lose consciousness or have trouble moving after a fall. Have a fall that causes a head injury. These symptoms may be an emergency. Get help right away. Call 911. Do not wait to see if the symptoms will go away. Do not drive yourself to the hospital. This information is not intended to replace advice given to you by your health care provider. Make sure you discuss any questions you have with your health care provider. Document Revised: 10/14/2021 Document Reviewed: 10/14/2021 Elsevier Patient Education  2024 [employer].

## 2023-06-26 NOTE — Patient Outreach (Signed)
 Received a voice message from patient stating she needs a new Rollator. PCP made aware via in basket, with request to send an Rx for a Rollator to [facility]. Advised patient confirmed this DME will be covered by her health plan.

## 2023-06-26 NOTE — Assessment & Plan Note (Signed)
 Fall occurred approximately on 4/15, should get some relief with applying ice over area in question. She will let me know if the discomfort persists.

## 2023-06-26 NOTE — Assessment & Plan Note (Signed)
 MGUS monitored by hematologist. No new concerning markers. - Continue follow-up with hematologist every six months.

## 2023-06-26 NOTE — Assessment & Plan Note (Signed)
 Chronic, she has decided to not move forward with foot surgery. F/u as per Podiatry.

## 2023-06-29 ENCOUNTER — Inpatient Hospital Stay: Admission: RE | Admit: 2023-06-29 | Source: Ambulatory Visit

## 2023-07-02 ENCOUNTER — Encounter: Payer: Self-pay | Admitting: Podiatry

## 2023-07-02 ENCOUNTER — Ambulatory Visit (INDEPENDENT_AMBULATORY_CARE_PROVIDER_SITE_OTHER): Admitting: Podiatry

## 2023-07-02 DIAGNOSIS — M2010 Hallux valgus (acquired), unspecified foot: Secondary | ICD-10-CM | POA: Diagnosis not present

## 2023-07-02 DIAGNOSIS — M2042 Other hammer toe(s) (acquired), left foot: Secondary | ICD-10-CM

## 2023-07-02 NOTE — Progress Notes (Signed)
 This patient presents to the office with chief complaint of  painful overlapping second toe left foot.  She has bunion left foot.  She was seen by Dr.  Lydia Sams who recommended surgery but she says she does not want surgery.  She presents to the office to discuss her surgery with me.  She presents not for nail care.  General Appearance  Alert, conversant and in no acute stress.  Vascular  Dorsalis pedis and posterior tibial  pulses are palpable  bilaterally.  Capillary return is within normal limits  bilaterally. Temperature is within normal limits  bilaterally.  Neurologic  Senn-Weinstein monofilament wire test diminished  bilaterally. Muscle power within normal limits bilaterally.  Nails Thick disfigured discolored nails with subungual debris  from hallux to fifth toes bilaterally. No evidence of bacterial infection or drainage bilaterally.  Orthopedic  No limitations of motion of motion feet .  No crepitus or effusions noted.  HAV  B/L.  Hammer toes  B/L.  Skin  normotropic skin with no porokeratosis noted bilaterally.  No signs of infections or ulcers noted.     Onychomycosis  Diabetes with no foot complications  HAV  B/L  Hammer toes  B/L.  Debride nails x 10.  Discussed fure care of her bunions and hammer toes.     RTC 3 months.   Ruffin Cotton DPM

## 2023-07-06 ENCOUNTER — Ambulatory Visit: Admit: 2023-07-06 | Admitting: Podiatry

## 2023-07-06 SURGERY — CORRECTION, HAMMER TOE
Anesthesia: Choice | Site: Toe | Laterality: Left

## 2023-07-07 ENCOUNTER — Ambulatory Visit (INDEPENDENT_AMBULATORY_CARE_PROVIDER_SITE_OTHER): Payer: Medicare HMO | Admitting: Psychiatry

## 2023-07-07 DIAGNOSIS — F3340 Major depressive disorder, recurrent, in remission, unspecified: Secondary | ICD-10-CM

## 2023-07-07 DIAGNOSIS — F4323 Adjustment disorder with mixed anxiety and depressed mood: Secondary | ICD-10-CM

## 2023-07-07 DIAGNOSIS — Z599 Problem related to housing and economic circumstances, unspecified: Secondary | ICD-10-CM

## 2023-07-07 DIAGNOSIS — E1122 Type 2 diabetes mellitus with diabetic chronic kidney disease: Secondary | ICD-10-CM

## 2023-07-07 DIAGNOSIS — R69 Illness, unspecified: Secondary | ICD-10-CM

## 2023-07-07 DIAGNOSIS — N1832 Chronic kidney disease, stage 3b: Secondary | ICD-10-CM

## 2023-07-07 DIAGNOSIS — Z794 Long term (current) use of insulin: Secondary | ICD-10-CM

## 2023-07-07 NOTE — Progress Notes (Signed)
 Psychotherapy Progress Note [facility] Delora Ferry, PhD LP  [name] [facility] "[name]")    MRN: [id number] Therapy format: Individual psychotherapy Date: 07/07/2023      Start: 9:11a     Stop: 10:01a     Time Spent: 50 min Location: In-person   Session narrative (presenting needs, interim history, self-report of stressors and symptoms, applications of prior therapy, status changes, and interventions made in session) Stayed home from church Mother's Day, was aching in ways that reminded of old folks' comments.  Yesterday picked up, cleaned out some things and got the windows open, and ongoing plans to declutter, well motivated right now.  Affirmed both as "taking care of [name]".  Has scheduled the housing program for Thursday, knows she will have to assess her finances further.  Concerns with society and government happenings discussed, with encouragement to keep faith in better things to emerge.    Unwanted contact from cousin Cain Castillo, notifying her of about a family death, Holland Lundborg, her second cousin.  Formerly close but estranged after using her for desperate help.  Got pressed to hear out a lot of news, indulged it, then decided to just block her the next day.  Challenged Pt about the nonverbal double message, encouraging her to be willing to speak her limits, too, not just show them after the fact.    Reveals she took a couple falls at home, dealing with mobility issues.  Cleaning up helps.  PT also helps when in it, been on a hiatus since the last fall.  Resolved to seek that again.  Has durably reduced alcohol  use, and feels better for it.  Affirmed and encouraged.  Trudee Furth showed up unannounced one Sunday, one in which she had happened to struggle whether to go to church earlier and reached out to a sister for help.  Glad she did, was able to refuse his advances.    Continues her charitable involvement with food donations and coffee service at church, among  other things.    Remains intent on taking an informational class on subsidized home ownership to prepare for next living situation.  Therapeutic modalities: Cognitive Behavioral Therapy, Solution-Oriented/Positive Psychology, Ego-Supportive, and Assertiveness/Communication  Mental Status/Observations:  Appearance:   Casual     Behavior:  Appropriate  Motor:  Rollator  Speech/Language:   Clear and Coherent  Affect:  Appropriate  Mood:  Baseline mix of mild anx/dep and hope  Thought process:  normal  Thought content:    WNL  Sensory/Perceptual disturbances:    WNL  Orientation:  Fully oriented  Attention:  Good    Concentration:  Fair  Memory:  WNL  Insight:    Good  Judgment:   Variable  Impulse Control:  Good   Risk Assessment: Danger to Self: No Self-injurious Behavior: No Danger to Others: No Physical Aggression / Violence: No Duty to Warn: No Access to Firearms a concern: No  Assessment of progress:  progressing  Diagnosis:   ICD-10-CM   1. MDD (recurrent major depressive disorder) in remission (HCC)  F33.40     2. Adjustment disorder with mixed anxiety and depressed mood  F43.23     3. Multiple comorbid health conditions  R69     4. Housing or economic circumstances  Z59.9     5. Type 2 diabetes mellitus with stage 3b chronic kidney disease, with long-term current use of insulin  (HCC)  E11.22    N18.32    Z79.4     6.  Stage 3b chronic kidney disease (HCC)  N18.32      Plan:  Socialization and relationships -- Endorse abstinence from dysfunctional relationships with men who use for sex, even if enjoyed, as they turn up empty.  Endorse limits with dysfunctional relatives as well, just encourage a standard of speaking limits, not just enacting them silently.  Endorse boundary/limits with Cain Castillo.  Endorse "adoptive" family relationships where present and strong friendships as enlarged family.  Continue involvement in church and suitable groups as able.  Develop  local friendships and "bonus" mother-daughter relationship at interest.  Offer to support groups such as CoDA or Al-Anon, and active elder facilities like [employer], [employer].  Endorse option to work part-time if truly able and interested.  Endorse choice over dating, so long as she can keep her integrity, maintain the view that she is worth working for and being honest for, and does not have to make any desperate decisions to feel loved and lovable.  Lonely does not have to be desperate.   Self-esteem -- Per her faith, continue to practice self-esteem grounded in God's unconditional love.  Self-affirm that whatever she might deem "stupid", she has either learned, improved, or was doing the best she could figure out at the time, while trying to deal with strong and natural feelings, e.g., loneliness.  When tempted to feel like a failure for lack of family, or for acting quickly on desires, reframe self-image as a survivor who had to take on the harshest of losses and is legitimately tempted.  Acknowledge regrets when they come up, and reaffirm having learned from them.  Options to inventory wrongs a la 4th and 5th steps of AA and to write letters, or write and burn confessions, as motivated, but stay true that taking care of herself now is itself righting wrongs, retiring regrets, and enacting healthy self-esteem. General health -- Endorse any healthy activity, to include pool/gym if able and interested.  Address diabetes and renal self-care promptly and reliably.  Recommend taming light in late evening and after bed, with options to use soothing sound and orange lenses. Living situation -- Endorse researching more reliable, better suited living space, and as needed, research tenant's rights in the event landlady defaults on the mortgage.  Endorse learning about supported possible home ownership if desired.  For housekeeping, try to make a practice of smaller, more frequent efforts and/or  bringing in help as needed, with the mindset that it's all a "gift to the lady who lives here".   Other recommendations/advice -- As may be noted above.  Continue to utilize previously learned skills ad lib. Medication compliance -- Maintain medication as prescribed and work faithfully with relevant prescriber(s) if any changes are desired or seem indicated. Crisis service -- Aware of call list and work-in appts.  Call the clinic on-call service, 988/hotline, 911, or present to [facility] or ER if any life-threatening psychiatric crisis. Followup -- Return for time as already scheduled.  Next scheduled visit with me 07/22/2023.  Next scheduled in this office 07/22/2023.  Maretta Shaper, PhD Delora Ferry, PhD LP Clinical Psychologist, [facility], [facility] [address], [address] [place], [place] [address] [phone]

## 2023-07-08 ENCOUNTER — Telehealth: Payer: Self-pay

## 2023-07-08 NOTE — Telephone Encounter (Signed)
 Called patient to inform their  tresiba  patient assistance  is ready for pick up, LVM.

## 2023-07-15 ENCOUNTER — Encounter: Admitting: Podiatry

## 2023-07-15 ENCOUNTER — Telehealth: Payer: Self-pay

## 2023-07-15 NOTE — Telephone Encounter (Signed)
 NOVOFINE 32G TIMP NEEDLE TRESIBA  5XML PREFILLED OZEMPIC   PT PICKED UP ON 07/15/23

## 2023-07-16 ENCOUNTER — Other Ambulatory Visit: Payer: Self-pay

## 2023-07-16 DIAGNOSIS — I10 Essential (primary) hypertension: Secondary | ICD-10-CM

## 2023-07-16 DIAGNOSIS — E1142 Type 2 diabetes mellitus with diabetic polyneuropathy: Secondary | ICD-10-CM

## 2023-07-16 NOTE — Patient Instructions (Signed)
 Visit Information  Thank you for taking time to visit with me today. Please don't hesitate to contact me if I can be of assistance to you before our next scheduled appointment.  Your next care management appointment is by telephone on Thursday, June 19 at 2 PM  Please call the care guide team at [phone] if you need to cancel, schedule, or reschedule an appointment.   Please call 1-800-273-TALK (toll free, 24 hour hotline) if you are experiencing a Mental Health or Behavioral Health Crisis or need someone to talk to.  Louanne Roussel RN BSN CCM [place]  [facility], [facility] Health Nurse Care Coordinator  Direct Dial: [phone] Website: [email or web address]

## 2023-07-16 NOTE — Patient Outreach (Signed)
 Complex Care Management   Visit Note  07/16/2023  Name:  Teresa Golden MRN: [id number] DOB: [birth year]  Situation: Referral received for Complex Care Management related to Diabetes with Complications and Hypertension, Polyneuropathy, CKD, recurrent UTI. I obtained verbal consent from Patient.  Visit completed with patient on the phone  Background:   Past Medical History:  Diagnosis Date   Arthritis    Chronic kidney disease    self reports ckd stage 3    Depression    Diabetes mellitus, type II, insulin  dependent (HCC)    With neurologic complications. 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    Hypertensive nephropathy 07/05/2018   Hypothyroidism    Hypothyroidism 02/06/2018   Morbid obesity with BMI of 50.0-59.9, adult (HCC)    Scoliosis     Assessment: Patient Reported Symptoms:  Cognitive Cognitive Status: Alert and oriented to person, place, and time Cognitive/Intellectual Conditions Management [RPT]: None reported or documented in medical history or problem list   Health Maintenance Behaviors: Annual physical exam, Immunizations, Exercise, Healthy diet, Spiritual practice(s) Health Facilitated by: Healthy diet, Prayer/meditation, Rest  Neurological Neurological Review of Symptoms: Other: Oher Neurological Symptoms/Conditions [RPT]: Neuropathy    HEENT HEENT Symptoms Reported: Not assessed      Cardiovascular Cardiovascular Symptoms Reported: Not assessed    Respiratory Respiratory Symptoms Reported: Not assesed    Endocrine Patient reports the following symptoms related to hypoglycemia or hyperglycemia : Shakiness (patient has experienced a few low sugars around 80, she feels shakiness and hungry) Is patient diabetic?: Yes Is  patient checking blood sugars at home?: Yes Endocrine Conditions: Diabetes Endocrine Management Strategies: Medication therapy, Diet modification, Routine screening Endocrine Self-Management Outcome: 4 (good)  Gastrointestinal Gastrointestinal Symptoms Reported: Not assessed      Genitourinary Genitourinary Symptoms Reported: Frequency Genitourinary Conditions: Chronic kidney disease, Urinary tract infection Genitourinary Management Strategies: Medication therapy, Medical device Genitourinary Self-Management Outcome: 3 (uncertain)  Integumentary Integumentary Symptoms Reported: Not assessed    Musculoskeletal Musculoskelatal Symptoms Reviewed: Unsteady gait Musculoskeletal Conditions: Unsteady gait, Other Other Musculoskeletal Conditions: Neuropathy, s/p right TKA Musculoskeletal Management Strategies: Routine screening, Exercise, Medical device Musculoskeletal Self-Management Outcome: 4 (good) Falls in the past year?: Yes Number of falls in past year: 1 or less Was there an injury with Fall?: Yes Fall Risk Category Calculator: 2 Patient Fall Risk Level: Moderate Fall Risk Patient at Risk for Falls Due to: History of fall(s) Fall risk Follow up: Falls evaluation completed, Education provided, Falls prevention discussed  Psychosocial Psychosocial Symptoms Reported: Depression - if selected complete PHQ 2-9   Major Change/Loss/Stressor/Fears (CP): Medical condition, self Techniques to Cope with Loss/Stress/Change: Counseling, Medication        07/16/2023   11:28 AM  Depression screen PHQ 2/9  Decreased Interest 1  Down, Depressed, Hopeless 1  PHQ - 2 Score 2    There were no vitals filed for this visit.  Medications Reviewed Today     Reviewed by Kaylene Pascal, RN (Registered Nurse) on 07/16/23 at 1054  Med List Status: <None>   Medication Order Taking? Sig Documenting Provider Last Dose Status Informant  allopurinol  (ZYLOPRIM ) 100 MG tablet [id number] No Take 1 tablet  (100 mg total) by mouth daily. Melodie Spry, NP Taking  Active   amLODipine  (NORVASC ) 5 MG tablet [id number] No Take 1 tablet by mouth once daily Cleave Curling, MD Taking Active   AZO-CRANBERRY PO [id number] No Take 1 tablet by mouth daily. [provider] Taking Active Self  B Complex-C (B-COMPLEX WITH VITAMIN C) tablet [id number] No Take 1 tablet by mouth 3 (three) times a week. [provider] Taking Active Nursing Home Medication Administration Guide (MAG)  betamethasone  dipropionate 0.05 % cream [id number] No Apply topically 2 (two) times daily. Melodie Spry, NP Taking Active   Biotin 41324 MCG TABS [id number] No Take 10,000 mcg by mouth daily. [provider] Taking Active Nursing Home Medication Administration Guide (MAG)  cetirizine  (ZYRTEC ) 10 MG tablet [id number] No Take 1 tablet (10 mg total) by mouth daily.  Patient taking differently: Take 10 mg by mouth daily as needed for allergies.   Melodie Spry, NP Taking Active   Cholecalciferol 5000 units TABS [id number] No Take 5,000 Units by mouth daily. [provider] Taking Active Nursing Home Medication Administration Guide (MAG)           Med Note Nolan Battle, [facility] I   Fri Aug 07, 2017  6:51 PM)    dapagliflozin  propanediol (FARXIGA ) 10 MG TABS tablet [id number] No Take 1 tablet (10 mg total) by mouth daily. Cleave Curling, MD Taking Active   diclofenac  Sodium (VOLTAREN ) 1 % GEL [id number] No APPLY 2 GRAMS TO AFFECTED AREA(S) 4 TIMES DAILY AS NEEDED  Patient taking differently: Apply 2-4 g topically every 6 (six) hours as needed (to affected areas).   Cleave Curling, MD Taking Active   gabapentin  (NEURONTIN ) 300 MG capsule [id number] No Take 2 capsules (600 mg total) by mouth at bedtime. Sikora, Rebecca, DPM Taking Active   glucose blood (ONETOUCH VERIO) test strip [id number] No USE TO CHECK BLOOD SUGAR   TWO TIMES A DAY AS         INSTRUCTED Cleave Curling, MD Taking Active   Insulin  Degludec (TRESIBA ) 100  UNIT/ML SOLN [id number] No Inject 10 Units/oz/day into the skin at bedtime. Patient is taking at bedtime [provider] Taking Active   Lancets [facility] Jewelene Morton PLUS Roann) MISC [id number] No USE TO CHECK BLOOD SUGARS  TWO TIMES A DAY AS         INSTRUCTED Cleave Curling, MD Taking Active   levothyroxine  (SYNTHROID ) 25 MCG tablet [id number] No Take 1/2 tablet daily. Cleave Curling, MD Taking Active   losartan  (COZAAR ) 100 MG tablet [id number] No TAKE 1 TABLET DAILY Cleave Curling, MD Taking Active   metoprolol  succinate (TOPROL -XL) 25 MG 24 hr tablet [id number] No TAKE 1 TABLET AT BEDTIME   (DISCONTINUE COREG ) Cleave Curling, MD Taking Active   Multiple Vitamins-Minerals (MULTIVITAMIN WITH MINERALS) tablet [id number] No Take 1 tablet by mouth daily. Women's 50+ [provider] Taking Active Nursing Home Medication Administration Guide (MAG)           Med Note Nolan Battle, [facility] I   Fri Aug 07, 2017  6:53 PM)    pantoprazole  (PROTONIX ) 40 MG tablet [id number] No Take 40 mg by mouth daily as needed. [provider] Taking Active   Semaglutide , 1 MG/DOSE, (OZEMPIC , 1 MG/DOSE,) 4 MG/3ML SOPN [id number] No Inject 1 mg into the skin once a week. Cleave Curling, MD Taking Active   Patient not taking:  Discontinued 11/06/20 1600 (Reorder) simvastatin  (ZOCOR ) 10 MG tablet [id number] No Take 1 tablet by mouth once daily Cleave Curling, MD Taking Active   triamcinolone ointment (KENALOG) 0.1 % [id number] No  Apply 1 Application topically 2 (two) times daily. [provider] Taking Active   Med List Note Thurman Flores, CPhT 11/02/20 2136): Admitted to [facility] on 10/22/2020 [place], [place]            Recommendation:   PCP Follow-up Specialty provider follow-up outpatient PT as directed   Follow Up Plan:   Telephone follow up appointment date/time:  Thursday, June 19 at 2:00 PM Referral to Pharmacist BSW  Louanne Roussel RN BSN CCM [facility]   [facility], [facility] Nurse Care Coordinator  Direct Dial: [phone] Website: [email or web address]

## 2023-07-17 ENCOUNTER — Other Ambulatory Visit: Payer: Self-pay | Admitting: Licensed Clinical Social Worker

## 2023-07-17 NOTE — Patient Instructions (Signed)
 Visit Information  Thank you for taking time to visit with me today. Please don't hesitate to contact me if I can be of assistance to you before our next scheduled appointment.  Our next appointment is by telephone on 07/31/2023 at 10:15 am Please call the care guide team at [phone] if you need to cancel or reschedule your appointment.   Following is a copy of your care plan:   Goals Addressed             This Visit's Progress    BSW VBCI Social Work Care Plan       Problems:   DME and HHA/PCS needs  CSW Clinical Goal(s):   Over the next 2 weeks the Patient will will follow up with the PCP as directed by Social Work.  Interventions:  SW sent an inbox message to the PCP requesting an order for a HHA/PCS and a new order  for a Rolator   Patient Goals/Self-Care Activities:  Coordinate with PCP to assist with new orders.  Plan:   Telephone follow up appointment with care management team member scheduled for:  07/31/2023 at 10:15 am        Please call the Suicide and Crisis Lifeline: 988 go to [facility] Urgent [facility] [address], [place] [phone]) call 911 if you are experiencing a Mental Health or Behavioral Health Crisis or need someone to talk to.  Patient verbalizes understanding of instructions and care plan provided today and agrees to view in MyChart. Active MyChart status and patient understanding of how to access instructions and care plan via MyChart confirmed with patient.      Jonda Neighbours, PhD [facility], [facility] Social Worker Direct Dial: [phone]  Fax: [phone]

## 2023-07-17 NOTE — Patient Outreach (Signed)
 Complex Care Management   Visit Note  07/17/2023  Name:  Teresa Golden MRN: [id number] DOB: February 28, 1945  Situation: Referral received for Complex Care Management related to SDOH Barriers:  DME and HHA/PCS orders I obtained verbal consent from Patient.  Visit completed with patient  on the phone  Background:   Past Medical History:  Diagnosis Date   Arthritis    Chronic kidney disease    self reports ckd stage 3    Depression    Diabetes mellitus, type II, insulin  dependent (HCC)    With neurologic complications. 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    Hypertensive nephropathy 07/05/2018   Hypothyroidism    Hypothyroidism 02/06/2018   Morbid obesity with BMI of 50.0-59.9, adult [facility])    Scoliosis     Assessment: Patient will not see the PCP until July and wanted the SW to send the PCP a message for a new order for a Rolator to go to [employer] and an order for a HHA/PCS twice a week for assistance with showers and to assist with washing hair.   SDOH Interventions    Flowsheet Row Patient Outreach Telephone from 07/17/2023 in [place] POPULATION HEALTH DEPARTMENT Patient Outreach Telephone from 07/16/2023 in [place] POPULATION HEALTH DEPARTMENT Care Coordination from 06/18/2023 in [place] POPULATION HEALTH DEPARTMENT Telephone from 06/04/2023 in [place] POPULATION HEALTH DEPARTMENT Care Coordination from 05/25/2023 in [facility] Care Coordination Care Coordination from 05/11/2023 in [facility] Care Coordination  SDOH Interventions        Food Insecurity Interventions Intervention Not Indicated -- Intervention Not Indicated -- Intervention Not Indicated Intervention Not Indicated   Housing Interventions Intervention Not Indicated -- Intervention Not Indicated Community Resources Provided  [SW Will email a housing list] Intervention Not Indicated  [Patient has been approved and selected for the [employer] and will will meeting with [facility] on tomorrow,] [employer] Provided  [employer] to move because the landlord has not been paying the [employer] and the patient has been paying her rent on time.]  Transportation Interventions Intervention Not Indicated -- Intervention Not Indicated -- Intervention Not Indicated  [Patient has SCAT] Intervention Not Indicated  [Patient has SCAT, Lyft or Uber]  Utilities Interventions Intervention Not Indicated -- Intervention Not Indicated -- Intervention Not Indicated Intervention Not Indicated  Depression Interventions/Treatment  -- Counseling, Currently on Treatment, Medication -- -- -- --  Financial Strain Interventions Intervention Not Indicated -- -- -- -- --  Health Literacy Interventions -- -- Intervention Not Indicated -- -- --         Recommendation:   None, Sw will in box the PCP  Follow Up Plan:   Telephone follow up appointment date/time:  07/31/2023 at 10:45 am  Jonda Neighbours, PhD [facility], [facility] Social Worker Direct Dial: [phone]  Fax: [phone]

## 2023-07-21 ENCOUNTER — Other Ambulatory Visit: Payer: Self-pay | Admitting: Internal Medicine

## 2023-07-21 DIAGNOSIS — I129 Hypertensive chronic kidney disease with stage 1 through stage 4 chronic kidney disease, or unspecified chronic kidney disease: Secondary | ICD-10-CM

## 2023-07-22 ENCOUNTER — Ambulatory Visit: Admitting: Psychiatry

## 2023-07-22 DIAGNOSIS — Z630 Problems in relationship with spouse or partner: Secondary | ICD-10-CM | POA: Diagnosis not present

## 2023-07-22 DIAGNOSIS — F331 Major depressive disorder, recurrent, moderate: Secondary | ICD-10-CM | POA: Diagnosis not present

## 2023-07-22 DIAGNOSIS — R69 Illness, unspecified: Secondary | ICD-10-CM | POA: Diagnosis not present

## 2023-07-22 DIAGNOSIS — Z599 Problem related to housing and economic circumstances, unspecified: Secondary | ICD-10-CM

## 2023-07-22 NOTE — Progress Notes (Unsigned)
 Psychotherapy Progress Note [facility] Teresa Ferry, PhD LP  [name] [facility] "Teresa Golden")    MRN: [id number] Therapy format: Individual psychotherapy Date: 07/22/2023      Start: 9:14a     Stop: 10:11a     Time Spent: 57 min Location: In-person   Session narrative (presenting needs, interim history, self-report of stressors and symptoms, applications of prior therapy, status changes, and interventions made in session) Rainy weather exacerbating aches and pains, figures to go to grocery and go home to bed today.    Took more decisive action to block 2 phone numbers Teresa Golden uses, after she showed some well-earned impatience listening to him complain about his GF and ex wife.  Confiding in 2 friends, especially Teresa Golden, whose H is in hospital with postsurgical infection not caught by nursing home.  Probed motivations for sticking with the "user" friendship listening to Teresa Golden.  Recognized some user characteristics with  blind friends Teresa Golden and Teresa Golden, too, re a church picnic and sweet potato pie she's planning to make.  Confirmed re Teresa Golden, she has been motivated by sex and the illusion of companionship, but she doesn't actually need either from him, and she doesn't need to dwell on him to the point where she keeps him involved to even a score or teach a lesson, either.  Encouraged to keep clear on that, as she seems to reiterate his problems beyond what is necessary to make clear boundary calls, and any of those motives may try to stage a comeback when she feels low.  Discussed at length.  Physically, she did get PT restarted, which is likely to help both movement and morale.    Financially, managing but delayed the housing workshop for weather.  Anticipates trying again next week.    And late in session admits being attracted to a distinguished man at her church, unsure what age but knows he is single.  Seemingly fishing for help resisting, since it seems to follow the  pattern of cutting off one bad influence only to get compulsive about involving herself with another man.  Validated that she can still be attracted and want what she wants, while endorsing trust that she can, to borrow a biblical phrase, "Be horny but do not sin."  Assured that even if she decided to try and tempt him, he sounds like a capable adult and a good man who would not allow it to go into bad or damaging territory, only the relatively small risk that she would shame herself trying to pursue him.  Otherwise, perfectly OK to be moved, or inspired, but available to discuss further next time.  Therapeutic modalities: Cognitive Behavioral Therapy, Solution-Oriented/Positive Psychology, Environmental manager, and Faith-sensitive  Mental Status/Observations:  Appearance:   Casual     Behavior:  Appropriate  Motor:  Rollator  Speech/Language:   Clear and Coherent  Affect:  Appropriate  Mood:  dysthymic  Thought process:  normal  Thought content:    WNL  Sensory/Perceptual disturbances:    WNL  Orientation:  Fully oriented  Attention:  Good    Concentration:  Fair  Memory:  WNL  Insight:    Variable  Judgment:   Good  Impulse Control:  Variable   Risk Assessment: Danger to Self: No Self-injurious Behavior: No Danger to Others: No Physical Aggression / Violence: No Duty to Warn: No Access to Firearms a concern: No  Assessment of progress:  stabilized  Diagnosis:   ICD-10-CM   1. Major depressive disorder,  recurrent episode, moderate (HCC)  F33.1     2. Relationship problem between partners  Z63.0     3. Multiple comorbid health conditions  R69     4. Housing or economic circumstances  Z59.9      Plan:  Socialization and relationships -- Endorse abstinence from dysfunctional relationships with men who use for sex, even if enjoyed, as they turn up empty.  Endorse limits with dysfunctional relatives as well, just encourage a standard of speaking limits, not just enacting them  silently.  Endorse boundary/limits with Teresa Golden.  Endorse "adoptive" family relationships where present and strong friendships as enlarged family.  Continue involvement in church and suitable groups as able.  Develop local friendships and "bonus" mother-daughter relationship at interest.  Offer to support groups such as CoDA or Al-Anon, and active elder facilities like Teresa Golden, Teresa Golden.  Endorse option to work part-time if truly able and interested.  Endorse choice over dating, so long as she can keep her integrity, maintain the view that she is worth working for and being honest for, and does not have to make any desperate decisions to feel loved and lovable.  Lonely does not have to be desperate.   Self-esteem -- Per her faith, continue to practice self-esteem grounded in God's unconditional love.  Self-affirm that whatever she might deem "stupid", she has either learned, improved, or was doing the best she could figure out at the time, while trying to deal with strong and natural feelings, e.g., loneliness.  When tempted to feel like a failure for lack of family, or for acting quickly on desires, reframe self-image as a survivor who had to take on the harshest of losses and is legitimately tempted.  Acknowledge regrets when they come up, and reaffirm having learned from them.  Options to inventory wrongs a la 4th and 5th steps of AA and to write letters, or write and burn confessions, as motivated, but stay true that taking care of herself now is itself righting wrongs, retiring regrets, and enacting healthy self-esteem. General health -- Endorse any healthy activity, to include pool/gym if able and interested.  Address diabetes and renal self-care promptly and reliably.  Recommend taming light in late evening and after bed, with options to use soothing sound and orange lenses. Living situation -- Endorse researching more reliable, better suited living space, and as needed, research  tenant's rights in the event landlady defaults on the mortgage.  Endorse learning about supported possible home ownership if desired.  For housekeeping, try to make a practice of smaller, more frequent efforts and/or bringing in help as needed, with the mindset that it's all a "gift to the lady who lives here".   Other recommendations/advice -- As may be noted above.  Continue to utilize previously learned skills ad lib. Medication compliance -- Maintain medication as prescribed and work faithfully with relevant prescriber(s) if any changes are desired or seem indicated. Crisis service -- Aware of call list and work-in appts.  Call the clinic on-call service, 988/hotline, 911, or present to [facility] or ER if any life-threatening psychiatric crisis. Followup -- Return for time as already scheduled.  Next scheduled visit with me 08/05/2023.  Next scheduled in this office 08/05/2023.  Maretta Shaper, PhD Teresa Ferry, PhD LP Clinical Psychologist, [facility] [address], [address] [place], [place] [address] [phone]

## 2023-07-23 ENCOUNTER — Telehealth: Payer: Self-pay | Admitting: Pharmacist

## 2023-07-23 ENCOUNTER — Other Ambulatory Visit: Payer: Self-pay | Admitting: Internal Medicine

## 2023-07-23 DIAGNOSIS — Z1231 Encounter for screening mammogram for malignant neoplasm of breast: Secondary | ICD-10-CM

## 2023-07-23 DIAGNOSIS — Z794 Long term (current) use of insulin: Secondary | ICD-10-CM

## 2023-07-23 NOTE — Progress Notes (Addendum)
   07/23/2023  Patient ID: Sherwin Donate, female   DOB: January 05, 1946, 78 y.o.   MRN: [id number]  Called Patient per referral for an alternative to Farxiga .  Unfortunately, she did not answer the phone. HIPAA compliant message was left on her voicemail.  Plan: Will call patient back in 3-5 business days.   Geronimo Krabbe, PharmD, [facility] Clinical Pharmacist [facility] [phone]  ADDENDUM------------------------------------------------------------------------------------------------------------------  Patient called me back.  HIPAA identifiers were obatined

## 2023-07-23 NOTE — Addendum Note (Signed)
 Addended by: Geronimo Krabbe on: 07/23/2023 02:21 PM   Modules accepted: Orders

## 2023-07-29 ENCOUNTER — Encounter: Admitting: Podiatry

## 2023-07-29 ENCOUNTER — Other Ambulatory Visit: Payer: Self-pay | Admitting: Family Medicine

## 2023-07-30 ENCOUNTER — Telehealth: Payer: Self-pay | Admitting: Pharmacist

## 2023-07-30 DIAGNOSIS — Z794 Long term (current) use of insulin: Secondary | ICD-10-CM

## 2023-07-30 DIAGNOSIS — E1122 Type 2 diabetes mellitus with diabetic chronic kidney disease: Secondary | ICD-10-CM

## 2023-07-30 NOTE — Progress Notes (Signed)
   07/30/2023  [name], female   DOB: 1945/08/03, 78 y.o.   MRN: [id number]  Patient was called per referral to speak to the Patient about alternative medications to Farxiga  due to it causing UTIs.  Unfortunately, the Patient did not answer the phone. HIPAA compliant message was left on her voicemail.  Plan: Call Patient back in 5-7 business days. (Today's call was the 2nd unsuccessful phone call)   Geronimo Krabbe, PharmD, [facility] Clinical Pharmacist [phone]

## 2023-07-31 ENCOUNTER — Other Ambulatory Visit: Payer: Self-pay | Admitting: Licensed Clinical Social Worker

## 2023-07-31 NOTE — Patient Instructions (Signed)
 Visit Information  Thank you for taking time to visit with me today. Please don't hesitate to contact me if I can be of assistance to you before our next scheduled appointment.  Your next care management appointment is by telephone on 08/14/2023 at 11:30 am   Please call the care guide team at [phone] if you need to cancel, schedule, or reschedule an appointment.   Please call the Suicide and Crisis Lifeline: 988 go to [facility] [address], [place] [phone]) call 911 if you are experiencing a Mental Health or Behavioral Health Crisis or need someone to talk to.  Jonda Neighbours, PhD [facility], [facility] Social Worker Direct Dial: [phone]  Fax: [phone]

## 2023-07-31 NOTE — Patient Outreach (Signed)
 Complex Care Management   Visit Note  07/31/2023  Name:  Teresa Golden MRN: [id number] DOB: [birth year]  Situation: Referral received for Complex Care Management related to SDOH Barriers:  DME Rollater and  I obtained verbal consent from Patient.  Visit completed with patient  on the phone  Background:   Past Medical History:  Diagnosis Date   Arthritis    Chronic kidney disease    self reports ckd stage 3    Depression    Diabetes mellitus, type II, insulin  dependent (HCC)    With neurologic complications. 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    Hypertensive nephropathy 07/05/2018   Hypothyroidism    Hypothyroidism 02/06/2018   Morbid obesity with BMI of 50.0-59.9, adult [facility])    Scoliosis     Assessment: Patient is in need of PCS and the form has been submitted and a new Rolator and the order has been done by the PCP.  SDOH Interventions    Flowsheet Row Patient Outreach from 07/31/2023 in [place] POPULATION HEALTH DEPARTMENT Patient Outreach Telephone from 07/17/2023 in [place] POPULATION HEALTH DEPARTMENT Patient Outreach Telephone from 07/16/2023 in [place] POPULATION HEALTH DEPARTMENT Care Coordination from 06/18/2023 in [place] POPULATION HEALTH DEPARTMENT Telephone from 06/04/2023 in [place] POPULATION HEALTH DEPARTMENT Care Coordination from 05/25/2023 in [facility] Community Care Coordination  SDOH Interventions        Food Insecurity Interventions Intervention Not Indicated Intervention Not Indicated -- Intervention Not Indicated -- Intervention Not Indicated  Housing Interventions Intervention Not Indicated Intervention Not Indicated -- Intervention Not Indicated Community Resources Provided  [SW Will  email a housing list] Intervention Not Indicated  [Patient has been approved and selected for the [employer] and will will meeting with [facility] on tomorrow,]  Transportation Interventions Intervention Not Indicated Intervention Not Indicated -- Intervention Not Indicated -- Intervention Not Indicated  [Patient has SCAT]  Utilities Interventions Intervention Not Indicated Intervention Not Indicated -- Intervention Not Indicated -- Intervention Not Indicated  Depression Interventions/Treatment  -- -- Counseling, Currently on Treatment, Medication -- -- --  Financial Strain Interventions Intervention Not Indicated Intervention Not Indicated -- -- -- --  Health Literacy Interventions -- -- -- Intervention Not Indicated -- --         Recommendation:   None  Follow Up Plan:   Telephone follow up appointment date/time:  08/14/2023 at 11:30 am  Jonda Neighbours, PhD [facility], [facility] Social Worker Direct Dial: [phone]  Fax: [phone]

## 2023-08-04 ENCOUNTER — Telehealth: Payer: Self-pay | Admitting: Pharmacist

## 2023-08-04 DIAGNOSIS — E1122 Type 2 diabetes mellitus with diabetic chronic kidney disease: Secondary | ICD-10-CM

## 2023-08-04 NOTE — Progress Notes (Unsigned)
 08/04/2023 Name: Teresa Golden MRN: [id number] DOB: 15-Dec-1945  Chief Complaint  Patient presents with   Medication Management    Diabetes     ALYSSA ROTONDO is a 78 y.o. year old female who presented for a telephone visit.   They were referred to the pharmacist by their PCP for assistance in managing medication access. Alternative to Farxiga    Subjective: Teresa Golden is a 78 year old female with multiple medical conditions including but not limited to:  type 2 diabetes, osteoarthritis of the right knee, hypertension, GERD, hypothyroidism, and CKD stage 3  Care Team: Primary Care Provider: Cleave Curling, MD ; Next Scheduled Visit: N/A  Medication Access/Adherence  Current Pharmacy:  [facility] - [place], [place] - [facility] AT Portal to Registered [address] [place] [address] Phone: [phone] Fax: [phone]  [facility] - [place], [place] - [address] [place] [address] Phone: [phone] Fax: [phone]  [facility] - [place], [place] - [address] [place] [address] Phone: [phone] Fax: [phone]   Patient reports affordability concerns with their medications: Yes Jardiance  Patient reports access/transportation concerns to their pharmacy: No  Patient reports adherence concerns with their medications:  Yes  Farxiga  due to UTI     Objective:  Lab Results  Component Value Date   HGBA1C 6.5 (H) 04/08/2023    Lab Results  Component Value Date   CREATININE 1.13 (H) 05/28/2023   BUN 18 05/28/2023   NA 139 05/28/2023   K 4.0 05/28/2023   CL 105 05/28/2023   CO2 26 05/28/2023    Lab Results  Component Value Date   CHOL 154 06/23/2022   HDL 72 06/23/2022   LDLCALC 72 06/23/2022   TRIG 45 06/23/2022   CHOLHDL 2.1 06/23/2022    Medications Reviewed Today     Reviewed by Geronimo Krabbe, RPH  (Pharmacist) on 08/04/23 at 1608  Med List Status: <None>   Medication Order Taking? Sig Documenting Provider Last Dose Status Informant  allopurinol  (ZYLOPRIM ) 100 MG tablet [id number] Yes Take 1 tablet by mouth once daily Melodie Spry, NP Taking Active   amLODipine  (NORVASC ) 5 MG tablet [id number] Yes Take 1 tablet by mouth once daily Cleave Curling, MD Taking Active   augmented betamethasone  dipropionate (DIPROLENE -AF) 0.05 % cream [id number] Yes APPLY CREAM TOPICALLY TO AFFECTED AREA TWICE DAILY [provider] Taking Active   AZO-CRANBERRY PO [id number] Yes Take 1 tablet by mouth daily. [provider] Taking Active Self  cetirizine  (ZYRTEC ) 10 MG tablet [id number] Yes Take 1 tablet (10 mg total) by mouth daily.  Patient taking differently: Take 10 mg by mouth daily as needed for allergies.   Melodie Spry, NP Taking Active   Cholecalciferol 5000 units TABS [id number] Yes Take 5,000 Units by mouth daily. [provider] Taking Active Nursing Home Medication Administration Guide (MAG)           Med Note Nolan Battle, [facility] I   Fri Aug 07, 2017  6:51 PM)    dapagliflozin  propanediol (FARXIGA ) 10 MG TABS tablet [id number] Yes Take 1 tablet (10 mg total) by mouth daily. Cleave Curling, MD Taking Active   diclofenac  Sodium (VOLTAREN ) 1 % GEL [id number] Yes APPLY 2 GRAMS TO AFFECTED AREA(S) 4 TIMES DAILY AS NEEDED  Patient taking differently: Apply 2-4 g topically every 6 (six) hours as needed (to affected areas).  Cleave Curling, MD Taking Active   gabapentin  (NEURONTIN ) 300 MG capsule [id number] Yes Take 2 capsules (600 mg total) by mouth at bedtime. Sikora, Rebecca, DPM Taking Active   glucose blood (ONETOUCH VERIO) test strip [id number] Yes USE TO CHECK BLOOD SUGAR   TWO TIMES A DAY AS         INSTRUCTED Cleave Curling, MD Taking Active   Insulin  Degludec (TRESIBA ) 100 UNIT/ML SOLN [id number] Yes Inject 10 Units/oz/day into the skin at bedtime. Patient is taking at bedtime  [provider] Taking Active   Lancets [facility] Jewelene Morton PLUS [place]) MISC [id number] Yes USE TO CHECK BLOOD SUGARS  TWO TIMES A DAY AS         INSTRUCTED Cleave Curling, MD Taking Active   levothyroxine  (SYNTHROID ) 25 MCG tablet [id number] Yes Take 1/2 tablet daily. Cleave Curling, MD Taking Active   losartan  (COZAAR ) 100 MG tablet [id number] Yes TAKE 1 TABLET DAILY Cleave Curling, MD Taking Active   metoprolol  succinate (TOPROL -XL) 25 MG 24 hr tablet [id number] Yes TAKE 1 TABLET AT BEDTIME   (DISCONTINUE COREG ) Cleave Curling, MD Taking Active   Multiple Vitamins-Minerals (MULTIVITAMIN WITH MINERALS) tablet [id number] Yes Take 1 tablet by mouth daily. Women's 50+ [provider] Taking Active Nursing Home Medication Administration Guide (MAG)           Med Note Nolan Battle, [place] I   Fri Aug 07, 2017  6:53 PM)    pantoprazole  (PROTONIX ) 40 MG tablet [id number] Yes Take 40 mg by mouth daily as needed. [provider] Taking Active   Semaglutide , 1 MG/DOSE, (OZEMPIC , 1 MG/DOSE,) 4 MG/3ML SOPN [id number] Yes Inject 1 mg into the skin once a week. Cleave Curling, MD Taking Active    Patient not taking:   Discontinued 11/06/20 1600 (Reorder) simvastatin  (ZOCOR ) 10 MG tablet [id number] Yes Take 1 tablet by mouth once daily Cleave Curling, MD Taking Active   triamcinolone ointment (KENALOG) 0.1 % [id number] Yes Apply 1 Application topically 2 (two) times daily. [provider] Taking Active   Med List Note Thurman Flores, CPhT 11/02/20 2136): Admitted to [facility] on 10/22/2020 [facility] [place] [place], [place]              Assessment/Plan:   Diabetes: - Currently controlled 6.5% -Patient said she had been on Jardiance  in the past without issue.  She has been experiencing UTIs with Farxiga .  -She said she was changed to Farxiga  due to being denied by [employer] in the past due to income -Patient's finances were reviewed today and she seems to be just  under the threshold for BI -. Will collaborate with provider, CPhT, and patient to pursue assistance.   Adherence/SUPD On statin therapy Simvastatin  10 mg last filled 05/10/23 LDL 72 On Losartan  100 mg last filled 05/01/23    Follow Up Plan:    Route note to RX Med Assistance Team to send Patient an application for Jardiance  10 mg  Follow up with Patient in 7-10 business days.   Geronimo Krabbe, PharmD, BCACP Clinical Pharmacist [phone]

## 2023-08-05 ENCOUNTER — Ambulatory Visit: Admitting: Psychiatry

## 2023-08-05 DIAGNOSIS — Z599 Problem related to housing and economic circumstances, unspecified: Secondary | ICD-10-CM

## 2023-08-05 DIAGNOSIS — R69 Illness, unspecified: Secondary | ICD-10-CM

## 2023-08-05 DIAGNOSIS — Z630 Problems in relationship with spouse or partner: Secondary | ICD-10-CM

## 2023-08-05 DIAGNOSIS — F3341 Major depressive disorder, recurrent, in partial remission: Secondary | ICD-10-CM | POA: Diagnosis not present

## 2023-08-05 MED ORDER — EMPAGLIFLOZIN 10 MG PO TABS: 10.0000 mg | ORAL_TABLET | Freq: Every day | ORAL | Status: DC
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
## 2023-08-12 ENCOUNTER — Ambulatory Visit
Admission: RE | Admit: 2023-08-12 | Discharge: 2023-08-12 | Disposition: A | Discharge status: Home / Self Care | Source: Ambulatory Visit | Attending: Internal Medicine | Admitting: Internal Medicine

## 2023-08-12 DIAGNOSIS — Z1231 Encounter for screening mammogram for malignant neoplasm of breast: Secondary | ICD-10-CM

## 2023-08-13 ENCOUNTER — Telehealth: Payer: Self-pay

## 2023-08-13 NOTE — Progress Notes (Signed)
 Complex Care Management Care Guide Note  08/13/2023 Name: Teresa Golden MRN: [id number] DOB: [birth year]  Teresa Golden is a 78 y.o. year old female who is a primary care patient of Cleave Curling, MD and is actively engaged with the care management team. I reached out to Sherwin Donate by phone today to assist with re-scheduling  with the RN Case Manager.  Follow up plan: Unsuccessful telephone outreach attempt made. A HIPAA compliant phone message was left for the patient providing contact information and requesting a return call.  Aaron AasBR

## 2023-08-14 ENCOUNTER — Other Ambulatory Visit: Admitting: Licensed Clinical Social Worker

## 2023-08-17 ENCOUNTER — Other Ambulatory Visit: Payer: Self-pay | Admitting: Licensed Clinical Social Worker

## 2023-08-17 ENCOUNTER — Encounter: Payer: Self-pay | Admitting: Licensed Clinical Social Worker

## 2023-08-19 ENCOUNTER — Ambulatory Visit (INDEPENDENT_AMBULATORY_CARE_PROVIDER_SITE_OTHER): Admitting: Psychiatry

## 2023-08-19 DIAGNOSIS — E1122 Type 2 diabetes mellitus with diabetic chronic kidney disease: Secondary | ICD-10-CM

## 2023-08-19 DIAGNOSIS — F3341 Major depressive disorder, recurrent, in partial remission: Secondary | ICD-10-CM

## 2023-08-19 DIAGNOSIS — N1832 Chronic kidney disease, stage 3b: Secondary | ICD-10-CM

## 2023-08-19 DIAGNOSIS — Z599 Problem related to housing and economic circumstances, unspecified: Secondary | ICD-10-CM

## 2023-08-19 DIAGNOSIS — Z794 Long term (current) use of insulin: Secondary | ICD-10-CM

## 2023-08-19 DIAGNOSIS — Z630 Problems in relationship with spouse or partner: Secondary | ICD-10-CM

## 2023-08-19 DIAGNOSIS — R69 Illness, unspecified: Secondary | ICD-10-CM | POA: Diagnosis not present

## 2023-08-19 NOTE — Progress Notes (Signed)
 Psychotherapy Progress Note [facility] Jodie Kendall, PhD LP  [name] [facility])    MRN: [id number] Therapy format: Individual psychotherapy Date: 08/19/2023      Start: 9:15a     Stop: 10:05a     Time Spent: 50 min Location: In-person   Session narrative (presenting needs, interim history, self-report of stressors and symptoms, applications of prior therapy, status changes, and interventions made in session) Sanguine about the heat right now.  Anticipating a big 5th-Sunday breakfast at church this week.  Still motivated to get in her own home, but rethinking it, not necessarily practical.  Would rather have a walk-in tub.  Worked through an Engineer, drilling.  Considering keto to lose some weight, as she is feeling her age more.  Encouraged in carb control, at least, for multiple purposes. Narrates making a few decisions to save money and calories by buying [employer] and making drinks at home rather than ordering drinks at [employer] while she waits for a rich seafood meal and runs a tab.  Encouraged to temper alcohol  use in general, but praised financial   [name] still calls, even into the evening.  Still pitches his lines, treating her like he must be irresistible and he'll wear down her resistance eventually and sleep with him again.  Getting turned off further by some ignorant thinking, e.g., the made up idea that [place] was trying to [employer] with their missiles in [place].  Reaffirms the sex has been best ever for her, but it really can't go anywhere.  Wishes she would have gotten it earlier about what is and isn't self-respect.    Would like to work a bit, but knows at near 63, she's a very unlikely hire.  Really has rethought the idea of buying a home and managing its needs, sees how she can be content and live better where she is.  Affirmed and encouraged.  Therapeutic modalities: Cognitive Behavioral Therapy, Solution-Oriented/Positive Psychology,  Environmental manager, and Faith-sensitive  Mental Status/Observations:  Appearance:   Casual     Behavior:  Appropriate  Motor:  Rollator  Speech/Language:   Clear and Coherent  Affect:  Appropriate  Mood:  normal  Thought process:  normal  Thought content:    WNL  Sensory/Perceptual disturbances:    WNL  Orientation:  Fully oriented  Attention:  Good    Concentration:  Fair  Memory:  WNL  Insight:    Good  Judgment:   Good  Impulse Control:  Variable   Risk Assessment: Danger to Self: No Self-injurious Behavior: No Danger to Others: No Physical Aggression / Violence: No Duty to Warn: No Access to Firearms a concern: No  Assessment of progress:  progressing  Diagnosis:   ICD-10-CM   1. Major depressive disorder, recurrent, in partial remission (HCC)  F33.41     2. Relationship problem between partners  Z63.0     3. Multiple comorbid health conditions  R69     4. Housing or economic circumstances  Z59.9     5. r/o Alcohol  dependence (psychological)  R69     6. Type 2 diabetes mellitus with stage 3b chronic kidney disease, with long-term current use of insulin  (HCC)  E11.22    N18.32    Z79.4      Plan:  Socialization and relationships -- Endorse abstinence from dysfunctional relationships with men who use for sex, even if enjoyed, as they turn up empty.  Endorse limits with dysfunctional relatives as well, just  encourage a standard of speaking limits, not just enacting them silently.  Endorse boundary/limits with Alfonso.  Endorse adoptive family relationships where present and strong friendships as enlarged family.  Continue involvement in church and suitable groups as able.  Develop local friendships and bonus mother-daughter relationship at interest.  Offer to support groups such as CoDA or Al-Anon, and active elder facilities like [employer], [employer].  Endorse option to work part-time if truly able and interested.  Endorse choice over dating, so long  as she can keep her integrity, maintain the view that she is worth working for and being honest for, and does not have to make any desperate decisions to feel loved and lovable.  Lonely does not have to be desperate.   Self-esteem -- Per her faith, continue to practice self-esteem grounded in God's unconditional love.  Self-affirm that whatever she might deem stupid, she has either learned, improved, or was doing the best she could figure out at the time, while trying to deal with strong and natural feelings, e.g., loneliness.  When tempted to feel like a failure for lack of family, or for acting quickly on desires, reframe self-image as a survivor who had to take on the harshest of losses and is legitimately tempted.  Acknowledge regrets when they come up, and reaffirm having learned from them.  Options to inventory wrongs a la 4th and 5th steps of AA and to write letters, or write and burn confessions, as motivated, but stay true that taking care of herself now is itself righting wrongs, retiring regrets, and enacting healthy self-esteem. General health -- Endorse any healthy activity, to include pool/gym if able and interested.  Address diabetes and renal self-care promptly and reliably.  Recommend taming light in late evening and after bed, with options to use soothing sound and orange lenses. Living situation -- Endorse researching more reliable, better suited living space, and as needed, research tenant's rights in the event landlady defaults on the mortgage.  Endorse learning about supported possible home ownership if desired.  For housekeeping, try to make a practice of smaller, more frequent efforts and/or bringing in help as needed, with the mindset that it's all a gift to the lady who lives here.   Other recommendations/advice -- As may be noted above.  Continue to utilize previously learned skills ad lib. Medication compliance -- Maintain medication as prescribed and work faithfully with  relevant prescriber(s) if any changes are desired or seem indicated. Crisis service -- Aware of call list and work-in appts.  Call the clinic on-call service, 988/hotline, 911, or present to [facility] or ER if any life-threatening psychiatric crisis. Followup -- Return for time as already scheduled.  Next scheduled visit with me 09/07/2023.  Next scheduled in this office 09/07/2023.  Lamar Kendall, PhD Jodie Kendall, PhD LP Clinical Psychologist, [facility] [address] [place], [place] [address] [phone]

## 2023-08-31 ENCOUNTER — Other Ambulatory Visit: Payer: Self-pay | Admitting: Licensed Clinical Social Worker

## 2023-08-31 NOTE — Patient Instructions (Signed)

## 2023-08-31 NOTE — Patient Outreach (Signed)
 Complex Care Management   Visit Note  08/31/2023  Name:  Teresa Golden MRN: [id number] DOB: [birth year]  Situation: Referral received for Complex Care Management related to assistance with rollater I obtained verbal consent from Patient.  Visit completed with patient  on the phone  Background:   Past Medical History:  Diagnosis Date   Arthritis    Chronic kidney disease    self reports ckd stage 3    Depression    Diabetes mellitus, type II, insulin  dependent (HCC)    With neurologic complications. 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 SW spoke to [employer] application for assistance and she has an appointment with the PCP. Application will be left with [place] for completion   Recommendation:   none  Follow Up Plan:   Closing From:  Complex Care Management  Tobias CHARM Maranda HEDWIG, PhD [facility], [facility] Social Worker Direct Dial: [phone]  Fax: [phone]

## 2023-09-01 ENCOUNTER — Encounter: Payer: Self-pay | Admitting: Internal Medicine

## 2023-09-01 ENCOUNTER — Telehealth: Payer: Self-pay

## 2023-09-01 ENCOUNTER — Encounter: Payer: Self-pay | Admitting: Licensed Clinical Social Worker

## 2023-09-01 ENCOUNTER — Ambulatory Visit: Payer: Medicare HMO | Admitting: Internal Medicine

## 2023-09-01 ENCOUNTER — Other Ambulatory Visit: Payer: Self-pay | Admitting: Internal Medicine

## 2023-09-01 VITALS — BP 124/80 | HR 64 | Temp 97.5°F | Ht 65.0 in | Wt 209.0 lb

## 2023-09-01 DIAGNOSIS — N1831 Chronic kidney disease, stage 3a: Secondary | ICD-10-CM

## 2023-09-01 DIAGNOSIS — E66811 Obesity, class 1: Secondary | ICD-10-CM

## 2023-09-01 DIAGNOSIS — I129 Hypertensive chronic kidney disease with stage 1 through stage 4 chronic kidney disease, or unspecified chronic kidney disease: Secondary | ICD-10-CM | POA: Diagnosis not present

## 2023-09-01 DIAGNOSIS — E1122 Type 2 diabetes mellitus with diabetic chronic kidney disease: Secondary | ICD-10-CM

## 2023-09-01 DIAGNOSIS — Z0000 Encounter for general adult medical examination without abnormal findings: Secondary | ICD-10-CM

## 2023-09-01 DIAGNOSIS — E1142 Type 2 diabetes mellitus with diabetic polyneuropathy: Secondary | ICD-10-CM

## 2023-09-01 DIAGNOSIS — Z794 Long term (current) use of insulin: Secondary | ICD-10-CM

## 2023-09-01 DIAGNOSIS — D472 Monoclonal gammopathy: Secondary | ICD-10-CM

## 2023-09-01 DIAGNOSIS — Z6834 Body mass index (BMI) 34.0-34.9, adult: Secondary | ICD-10-CM

## 2023-09-01 DIAGNOSIS — K7460 Unspecified cirrhosis of liver: Secondary | ICD-10-CM

## 2023-09-01 DIAGNOSIS — E6609 Other obesity due to excess calories: Secondary | ICD-10-CM

## 2023-09-01 LAB — POCT URINALYSIS DIP (CLINITEK)
Bilirubin, UA: NEGATIVE
Blood, UA: NEGATIVE
Glucose, UA: 500 mg/dL — AB
Ketones, POC UA: NEGATIVE mg/dL
Nitrite, UA: NEGATIVE
POC PROTEIN,UA: NEGATIVE
Spec Grav, UA: 1.01 (ref 1.010–1.025)
Urobilinogen, UA: 0.2 U/dL
pH, UA: 5.5 (ref 5.0–8.0)

## 2023-09-01 NOTE — Assessment & Plan Note (Addendum)
 Chronic, controlled.   EKG performed, NSR w/ RBBB - no new changes. She will continue with metoprolol  succinate XL 25mg  daily at bedtime and losartan  100mg  daily. She is encouraged to follow low sodium diet.

## 2023-09-01 NOTE — Assessment & Plan Note (Signed)
 She is encouraged to strive for BMI less than 30 to decrease cardiac risk. Advised to aim for at least 150 minutes of exercise per week.

## 2023-09-01 NOTE — Assessment & Plan Note (Signed)
 MGUS monitored by hematologist. No new concerning markers. - Continue follow-up with hematologist every six months.

## 2023-09-01 NOTE — Progress Notes (Signed)
 I,Teresa Golden, CMA,acting as a Neurosurgeon for Teresa LOISE Slocumb, MD.,have documented all relevant documentation on the behalf of Teresa LOISE Slocumb, MD,as directed by  Teresa LOISE Slocumb, MD while in the presence of Teresa LOISE Slocumb, MD.  Subjective:  [name] , female    DOB: December 24, 1945 , 78 y.o.   MRN: [id number]  Chief Complaint  Patient presents with   Annual Exam    Patient presents today for annual exam. She is no longer followed by Gyn.  She reports compliance with medications. She denies headache, chest pain, SOB & blurred vision.  Patient would like a referral for home care. She needs help with taking a shower. She reports she has a transfer chair. She is concerned about using the farxiga  but she keeps getting uti.     HPI Discussed the use of AI scribe software for clinical note transcription with the patient, who gave verbal consent to proceed.  History of Present Illness Teresa Golden is a 78 year old female with diabetes and hypertension who presents for a physical exam,  diabetes and blood pressure check.  Her blood sugar levels are generally stable, with readings around 128, occasionally dropping to 90. Blood sugar increases when she deviates from her diet. She takes Farxiga  once daily but wishes to discontinue it due to frequent urinary tract infections. She is considering switching to Jardiance  but is concerned about the cost due to her insurance formulary.  She has a history of cirrhosis and undergoes regular screening. She has not had a liver ultrasound since last year, although she had a kidney ultrasound in February.  She experiences tingling in her hands and feet, consistent with diabetic peripheral neuropathy. She has stopped taking vitamin B12, as her levels were previously too high, and now takes a multivitamin for women over sixty.  Bowel movements are regular, occurring daily, with occasional constipation attributed to insufficient water  intake.  She manages this by increasing her water  intake and consuming yogurt daily.  She lives on [address] and uses [employer] for transportation, which limits her travel to within [place]. She has private insurance but does not have the card with her and is unsure of the name. She plans to provide this information later.   Diabetes She presents for her follow-up diabetic visit. She has type 2 diabetes mellitus. Pertinent negatives for diabetes include no blurred vision and no chest pain. There are no hypoglycemic complications. Risk factors for coronary artery disease include diabetes mellitus, dyslipidemia, hypertension, obesity, sedentary lifestyle and post-menopausal. She is following a diabetic diet. She participates in exercise intermittently. Her home blood glucose trend is fluctuating minimally. Her breakfast blood glucose is taken between 8-9 am. Her breakfast blood glucose range is generally 110-130 mg/dl. An ACE inhibitor/angiotensin II receptor blocker is being taken. Eye exam is current.  Hypertension This is a chronic problem. The current episode started more than 1 year ago. The problem has been gradually improving since onset. The problem is uncontrolled. Pertinent negatives include no blurred vision or chest pain. Risk factors for coronary artery disease include diabetes mellitus, dyslipidemia, post-menopausal state and sedentary lifestyle. Past treatments include angiotensin blockers and beta blockers. The current treatment provides moderate improvement.     Past Medical History:  Diagnosis Date   Arthritis    Chronic kidney disease    self reports ckd stage 3    Depression    Diabetes mellitus, type II, insulin  dependent (HCC)    With  neurologic complications. Bilateral lower extremity peripheral neuropathy   Dyspnea    with excertion; no issues now since weight loss surgery    Endometriosis    Essential hypertension    GERD (gastroesophageal reflux disease)    Hepatitis  C    C dormant; states she is in remission since taking Harvoni    Hypertensive nephropathy 07/05/2018   Hypothyroidism    Hypothyroidism 02/06/2018   Morbid obesity with BMI of 50.0-59.9, adult (HCC)    Scoliosis      Family History  Problem Relation Age of Onset   Heart attack Mother    Heart failure Mother    Stroke Father    Breast cancer Neg Hx    BRCA 1/2 Neg Hx      Current Outpatient Medications:    allopurinol  (ZYLOPRIM ) 100 MG tablet, Take 1 tablet by mouth once daily, Disp: 270 tablet, Rfl: 0   amLODipine  (NORVASC ) 5 MG tablet, Take 1 tablet by mouth once daily, Disp: 90 tablet, Rfl: 2   augmented betamethasone  dipropionate (DIPROLENE -AF) 0.05 % cream, APPLY CREAM TOPICALLY TO AFFECTED AREA TWICE DAILY, Disp: , Rfl:    AZO-CRANBERRY PO, Take 1 tablet by mouth daily., Disp: , Rfl:    cetirizine  (ZYRTEC ) 10 MG tablet, Take 1 tablet (10 mg total) by mouth daily. (Patient taking differently: Take 10 mg by mouth daily as needed for allergies.), Disp: 30 tablet, Rfl: 1   Cholecalciferol 5000 units TABS, Take 5,000 Units by mouth daily., Disp: , Rfl:    dapagliflozin  propanediol (FARXIGA ) 10 MG TABS tablet, Take 1 tablet (10 mg total) by mouth daily., Disp: 90 tablet, Rfl: 3   diclofenac  Sodium (VOLTAREN ) 1 % GEL, APPLY 2 GRAMS TO AFFECTED AREA(S) 4 TIMES DAILY AS NEEDED (Patient taking differently: Apply 2-4 g topically every 6 (six) hours as needed (to affected areas).), Disp: 200 g, Rfl: 2   empagliflozin  (JARDIANCE ) 10 MG TABS tablet, Take 1 tablet (10 mg total) by mouth daily before breakfast., Disp: , Rfl:    gabapentin  (NEURONTIN ) 300 MG capsule, Take 2 capsules (600 mg total) by mouth at bedtime., Disp: 180 capsule, Rfl: 2   glucose blood (ONETOUCH VERIO) test strip, USE TO CHECK BLOOD SUGAR   TWO TIMES A DAY AS         INSTRUCTED, Disp: 200 strip, Rfl: 3   Insulin  Degludec (TRESIBA ) 100 UNIT/ML SOLN, Inject 10 Units/oz/day into the skin at bedtime. Patient is taking at  bedtime, Disp: , Rfl:    Lancets (ONETOUCH DELICA PLUS LANCET33G) MISC, USE TO CHECK BLOOD SUGARS  TWO TIMES A DAY AS         INSTRUCTED, Disp: 200 each, Rfl: 3   levothyroxine  (SYNTHROID ) 25 MCG tablet, Take 1/2 tablet daily., Disp: 90 tablet, Rfl: 1   losartan  (COZAAR ) 100 MG tablet, TAKE 1 TABLET DAILY, Disp: 90 tablet, Rfl: 2   metoprolol  succinate (TOPROL -XL) 25 MG 24 hr tablet, TAKE 1 TABLET AT BEDTIME   (DISCONTINUE COREG ), Disp: 90 tablet, Rfl: 1   Multiple Vitamins-Minerals (MULTIVITAMIN WITH MINERALS) tablet, Take 1 tablet by mouth daily. Women's 50+, Disp: , Rfl:    pantoprazole  (PROTONIX ) 40 MG tablet, Take 40 mg by mouth daily as needed., Disp: , Rfl:    Semaglutide , 1 MG/DOSE, (OZEMPIC , 1 MG/DOSE,) 4 MG/3ML SOPN, Inject 1 mg into the skin once a week., Disp: 9 mL, Rfl: 1   simvastatin  (ZOCOR ) 10 MG tablet, Take 1 tablet by mouth once daily, Disp: 90 tablet, Rfl: 2  triamcinolone ointment (KENALOG) 0.1 %, Apply 1 Application topically 2 (two) times daily., Disp: , Rfl:    Allergies  Allergen Reactions   Meloxicam Other (See Comments)    Fever; muscle aches; flu-like symptoms Allergic, per MAR   Grass Pollen(K-O-R-T-Swt Vern) Other (See Comments)    Sinus inflammation    Other Other (See Comments)    Rose fever and hay fever     Review of Systems  Constitutional: Negative.   HENT: Negative.    Eyes: Negative.  Negative for blurred vision.  Respiratory: Negative.    Cardiovascular:  Negative for chest pain.  Gastrointestinal: Negative.   Endocrine: Negative.   Genitourinary: Negative.   Musculoskeletal: Negative.   Skin: Negative.   Allergic/Immunologic: Negative.   Neurological: Negative.   Hematological: Negative.   Psychiatric/Behavioral: Negative.       Today's Vitals   09/01/23 1011  BP: 124/80  Pulse: 64  Temp: (!) 97.5 F (36.4 C)  TempSrc: Oral  Weight: 209 lb (94.8 kg)  Height: 5' 5 (1.651 m)  PainSc: 0-No pain   Body mass index is 34.78  kg/m.  Wt Readings from Last 3 Encounters:  09/01/23 209 lb (94.8 kg)  06/16/23 209 lb (94.8 kg)  05/28/23 205 lb 4.8 oz (93.1 kg)     Objective:  Physical Exam Vitals and nursing note reviewed.  Constitutional:      Appearance: Normal appearance. She is obese.     Comments: Examined while in chair  HENT:     Head: Normocephalic and atraumatic.     Right Ear: Tympanic membrane, ear canal and external ear normal.     Left Ear: Tympanic membrane, ear canal and external ear normal.     Nose: Nose normal.     Mouth/Throat:     Mouth: Mucous membranes are moist.     Pharynx: Oropharynx is clear.  Eyes:     Extraocular Movements: Extraocular movements intact.     Conjunctiva/sclera: Conjunctivae normal.     Pupils: Pupils are equal, round, and reactive to light.  Cardiovascular:     Rate and Rhythm: Normal rate and regular rhythm.     Pulses: Normal pulses.          Dorsalis pedis pulses are 2+ on the right side and 2+ on the left side.     Heart sounds: Normal heart sounds.  Pulmonary:     Effort: Pulmonary effort is normal.     Breath sounds: Normal breath sounds.  Chest:  Breasts:    Tanner Score is 5.     Right: Normal.     Left: Normal.     Comments: Pendulous  Abdominal:     General: Bowel sounds are normal.     Palpations: Abdomen is soft.     Comments: Obese, soft.  Genitourinary:    Comments: deferred Musculoskeletal:        General: Normal range of motion.     Cervical back: Normal range of motion and neck supple.     Comments: Ambulatory w/ walker  Feet:     Right foot:     Protective Sensation: 5 sites tested.  5 sites sensed.     Skin integrity: Dry skin present.     Toenail Condition: Right toenails are long.     Left foot:     Protective Sensation: 5 sites tested.  5 sites sensed.     Skin integrity: Dry skin present.     Toenail Condition: Left toenails are long.  Skin:    General: Skin is warm and dry.  Neurological:     General: No focal  deficit present.     Mental Status: She is alert and oriented to person, place, and time.  Psychiatric:        Mood and Affect: Mood normal.        Behavior: Behavior normal.      Assessment And Plan:  Encounter for general adult medical examination w/o abnormal findings Assessment & Plan: A full exam was performed.  Importance of monthly self breast exams was discussed with the patient.  She is advised to get 30-45 minutes of regular exercise, no less than four to five days per week. Both weight-bearing and aerobic exercises are recommended.  She is advised to follow a healthy diet with at least six fruits/veggies per day, decrease intake of red meat and other saturated fats and to increase fish intake to twice weekly.  Meats/fish should not be fried -- baked, boiled or broiled is preferable. It is also important to cut back on your sugar intake.  Be sure to read labels - try to avoid anything with added sugar, high fructose corn syrup or other sweeteners.  If you must use a sweetener, you can try stevia or monkfruit.  It is also important to avoid artificially sweetened foods/beverages and diet drinks. Lastly, wear SPF 50 sunscreen on exposed skin and when in direct sunlight for an extended period of time.  Be sure to avoid fast food restaurants and aim for at least 60 ounces of water  daily.       Polyneuropathy due to type 2 diabetes mellitus (HCC) Assessment & Plan: Tingling in hands and feet consistent with diabetic peripheral neuropathy. Vitamin B12 discontinued due to high levels. - Continue multivitamin for women over 60.   Type 2 diabetes mellitus with stage 3a chronic kidney disease, with long-term current use of insulin  (HCC) Assessment & Plan: Blood glucose generally well-controlled with occasional elevations. Frequent UTIs potentially related to Farxiga . Interested in switching to Jardiance  for cardioprotective and renoprotective benefits, but cost is a concern. - Discontinue  Farxiga  for one week. - Provide samples of Jardiance  10 mg. - Instruct to start Jardiance  after one week if samples are available. - Monitor blood glucose levels and dietary sugar intake. - Schedule follow-up in four weeks after starting Jardiance  to recheck kidney function. - Consider alternative dosing schedule if Jardiance  is not tolerated.  Orders: -     CMP14+EGFR -     Hemoglobin A1c -     Lipid panel -     TSH  Hypertensive nephropathy Assessment & Plan: Chronic, controlled.   EKG performed, NSR w/ RBBB - no new changes. She will continue with metoprolol  succinate XL 25mg  daily at bedtime and losartan  100mg  daily. She is encouraged to follow low sodium diet.   Orders: -     POCT URINALYSIS DIP (CLINITEK) -     Microalbumin / creatinine urine ratio -     EKG 12-Lead  MGUS (monoclonal gammopathy of unknown significance) Assessment & Plan: MGUS monitored by hematologist. No new concerning markers. - Continue follow-up with hematologist every six months.   Cirrhosis of liver without ascites, unspecified hepatic cirrhosis type (HCC) Assessment & Plan: Cirrhosis with history of hepatitis C. Last ultrasound showed no concerning findings. Agreed to resume monitoring. - Emphasized importance of regular follow-up with liver specialist. - Regular screening ongoing. Last liver ultrasound missed; kidney ultrasound performed instead. - Order liver ultrasound.  Orders: -  US  ABDOMEN LIMITED RUQ (LIVER/GB); Future  Class 1 obesity due to excess calories with serious comorbidity and body mass index (BMI) of 34.0 to 34.9 in adult Assessment & Plan: She is encouraged to strive for BMI less than 30 to decrease cardiac risk. Advised to aim for at least 150 minutes of exercise per week.    Goals of Care Prefers home care services, has private insurance for this purpose, desires to die at home. - Coordinate with Child psychotherapist and referral coordinator to arrange personal care  services. - Assist with insurance paperwork for personal care services.  Return in 5 weeks (on 10/06/2023), or jardiance  f/u, for 1 year physical, 4 month DM.  Patient was given opportunity to ask questions. Patient verbalized understanding of the plan and was able to repeat key elements of the plan. All questions were answered to their satisfaction.   I, Teresa LOISE Slocumb, MD, have reviewed all documentation for this visit. The documentation on 09/01/23 for the exam, diagnosis, procedures, and orders are all accurate and complete.   IF YOU HAVE BEEN REFERRED TO A SPECIALIST, IT MAY TAKE 1-2 WEEKS TO SCHEDULE/PROCESS THE REFERRAL. IF YOU HAVE NOT HEARD FROM US /SPECIALIST IN TWO WEEKS, PLEASE GIVE US  A CALL AT [phone].   THE PATIENT IS ENCOURAGED TO PRACTICE SOCIAL DISTANCING DUE TO THE COVID-19 PANDEMIC.

## 2023-09-01 NOTE — Patient Instructions (Signed)

## 2023-09-01 NOTE — Telephone Encounter (Signed)
 Completed patient assistance application for Jardiance  through [employer] ann emailed to [place] to have pt. And provider sign at upcoming appointment.

## 2023-09-01 NOTE — Assessment & Plan Note (Signed)

## 2023-09-02 LAB — LIPID PANEL
Chol/HDL Ratio: 2.4 ratio (ref 0.0–4.4)
Cholesterol, Total: 170 mg/dL (ref 100–199)
HDL: 70 mg/dL (ref >39)
LDL Chol Calc (NIH): 88 mg/dL (ref 0–99)
Triglycerides: 62 mg/dL (ref 0–149)
VLDL Cholesterol Cal: 12 mg/dL (ref 5–40)

## 2023-09-02 LAB — CMP14+EGFR
ALT: 15 IU/L (ref 0–32)
AST: 24 IU/L (ref 0–40)
Albumin: 4.3 g/dL (ref 3.8–4.8)
Alkaline Phosphatase: 94 IU/L (ref 44–121)
BUN/Creatinine Ratio: 20 (ref 12–28)
BUN: 23 mg/dL (ref 8–27)
Bilirubin Total: 0.4 mg/dL (ref 0.0–1.2)
CO2: 22 mmol/L (ref 20–29)
Calcium: 9.7 mg/dL (ref 8.7–10.3)
Chloride: 104 mmol/L (ref 96–106)
Creatinine, Ser: 1.17 mg/dL — ABNORMAL HIGH (ref 0.57–1.00)
Globulin, Total: 3.3 g/dL (ref 1.5–4.5)
Glucose: 126 mg/dL — ABNORMAL HIGH (ref 70–99)
Potassium: 4.2 mmol/L (ref 3.5–5.2)
Sodium: 142 mmol/L (ref 134–144)
Total Protein: 7.6 g/dL (ref 6.0–8.5)
eGFR: 47 mL/min/1.73 — ABNORMAL LOW (ref >59)

## 2023-09-02 LAB — TSH: TSH: 3.17 u[IU]/mL (ref 0.450–4.500)

## 2023-09-02 LAB — MICROALBUMIN / CREATININE URINE RATIO
Creatinine, Urine: 123.3 mg/dL
Microalb/Creat Ratio: 20 mg/g{creat} (ref 0–29)
Microalbumin, Urine: 24.1 ug/mL

## 2023-09-02 LAB — HEMOGLOBIN A1C
Est. average glucose Bld gHb Est-mCnc: 137 mg/dL
Hgb A1c MFr Bld: 6.4 % — ABNORMAL HIGH (ref 4.8–5.6)

## 2023-09-02 NOTE — Telephone Encounter (Signed)
 PAP: Application for [place] has been submitted to [employer]), via fax

## 2023-09-03 NOTE — Telephone Encounter (Signed)
 PAP: Patient assistance application for Jardiance  has been approved by PAP Companies: [employer] from 09/03/2023 to 02/24/2024. Medication should be delivered to PAP Delivery: Home. For further shipping updates, please contact [employer] ([employer]) at [phone]. Patient ID is: per automated system

## 2023-09-03 NOTE — Progress Notes (Signed)
 Pharmacy Medication Assistance Program Note    09/03/2023  [name], female   DOB: 1946/02/19, 78 y.o.   MRN: [id number]     02/09/2023 04/03/2023 09/01/2023  Outreach Medication One  Initial Outreach Date (Medication One)  04/03/2023   Manufacturer Medication One [employer]  Music therapist Drugs  Farxiga    Dose of Farxiga   10mg    [facility] [employer]   Jardiance   Dose of Jardiance    25mg   [facility] Ozempic     Dose of Ozempic  1MG /WEEK    Type of Sport and exercise psychologist Assistance  Date Application Sent to Patient   09/01/2023  Application Items Requested   Application;Proof of Income  Date Application Sent to Prescriber   09/01/2023  Name of Prescriber ROBYN SANDERS  Catheryn Slocumb  Date Application Received From Patient  04/09/2023 09/02/2023  Application Items Received From Patient  Application Application  Date Application Received From Provider 02/12/2023 04/09/2023 09/02/2023  Date Application Submitted to Manufacturer 02/12/2023 04/09/2023 09/02/2023  Method Application Sent to Manufacturer Fax  Fax  Patient Assistance Determination Approved Approved Approved  Approval Start Date 02/25/2023 02/25/2023 09/03/2023  Approval End Date 02/24/2024 02/24/2024 02/24/2024  Patient Notification Method MyChart Telephone Call Telephone Call  Telephone Call Outcome  Left Voicemail Left Voicemail     Signature

## 2023-09-04 ENCOUNTER — Other Ambulatory Visit: Payer: Self-pay | Admitting: Internal Medicine

## 2023-09-04 ENCOUNTER — Other Ambulatory Visit

## 2023-09-04 NOTE — Telephone Encounter (Unsigned)
 Copied from CRM [id number]. Topic: Clinical - Medication Refill >> Sep 04, 2023  3:02 PM Kevelyn M wrote: Medication: glucose blood (ONETOUCH VERIO) test strip  Has the patient contacted their pharmacy? Yes (Agent: If no, request that the patient contact the pharmacy for the refill. If patient does not wish to contact the pharmacy document the reason why and proceed with request.) (Agent: If yes, when and what did the pharmacy advise?)  This is the patient's preferred pharmacy:  [facility] - [place], [place] - [address] AT Portal to Registered [facility] Sites [address] [place] [address] Phone: [phone] Fax: [phone]    Is this the correct pharmacy for this prescription? Yes If no, delete pharmacy and type the correct one.   Has the prescription been filled recently? No  Is the patient out of the medication? No  Has the patient been seen for an appointment in the last year OR does the patient have an upcoming appointment? Yes  Can we respond through MyChart? No  Agent: Please be advised that Rx refills may take up to 3 business days. We ask that you follow-up with your pharmacy.

## 2023-09-07 ENCOUNTER — Ambulatory Visit (INDEPENDENT_AMBULATORY_CARE_PROVIDER_SITE_OTHER): Admitting: Psychiatry

## 2023-09-07 DIAGNOSIS — Z630 Problems in relationship with spouse or partner: Secondary | ICD-10-CM

## 2023-09-07 DIAGNOSIS — F3341 Major depressive disorder, recurrent, in partial remission: Secondary | ICD-10-CM | POA: Diagnosis not present

## 2023-09-07 DIAGNOSIS — N1832 Chronic kidney disease, stage 3b: Secondary | ICD-10-CM | POA: Diagnosis not present

## 2023-09-07 DIAGNOSIS — R69 Illness, unspecified: Secondary | ICD-10-CM

## 2023-09-07 MED ORDER — ONETOUCH VERIO VI STRP
ORAL_STRIP | 3 refills | Status: AC
Start: 2023-09-07 — End: ?

## 2023-09-07 NOTE — Assessment & Plan Note (Signed)
 Tingling in hands and feet consistent with diabetic peripheral neuropathy. Vitamin B12 discontinued due to high levels. - Continue multivitamin for women over 60.

## 2023-09-07 NOTE — Assessment & Plan Note (Signed)
 Cirrhosis with history of hepatitis C. Last ultrasound showed no concerning findings. Agreed to resume monitoring. - Emphasized importance of regular follow-up with liver specialist. - Regular screening ongoing. Last liver ultrasound missed; kidney ultrasound performed instead. - Order liver ultrasound.

## 2023-09-07 NOTE — Assessment & Plan Note (Signed)
 Blood glucose generally well-controlled with occasional elevations. Frequent UTIs potentially related to Farxiga . Interested in switching to Jardiance  for cardioprotective and renoprotective benefits, but cost is a concern. - Discontinue Farxiga  for one week. - Provide samples of Jardiance  10 mg. - Instruct to start Jardiance  after one week if samples are available. - Monitor blood glucose levels and dietary sugar intake. - Schedule follow-up in four weeks after starting Jardiance  to recheck kidney function. - Consider alternative dosing schedule if Jardiance  is not tolerated.

## 2023-09-07 NOTE — Progress Notes (Signed)
 Psychotherapy Progress Note [facility] Jodie Kendall, PhD LP  [name] [facility])    MRN: [id number] Therapy format: Individual psychotherapy Date: 09/07/2023      Start: 9:16a     Stop: 10:03a     Time Spent: 47 min Location: In-person   Session narrative (presenting needs, interim history, self-report of stressors and symptoms, applications of prior therapy, status changes, and interventions made in session) Saving good money switching up telecom and cell service.  Maint current reasoning that she is probably un-hire-able at her age and condition and without independent transportation.  Content with current rental, which is est $450 below market.  Thinking of a staycation for next birthday (79th, in Feb).    Epiphany again about [name], hearing word that a good man will listen and be moved if you tell him he hurt you.  Finding him boring, too, after hx of speaking 2-3 a day.  Received an overture from a younger man (9s) on Facebook dating, politely declined.  Active with church activities, including warming up to a schoolchildren's backpack packing session and a [employer].    Last week had to call EMS after several hours of not eating, understands what she did.  Can't get into her MyChart, asks for lab results -- A1C 6.4, sGFR 47, both improvements.  TSH normal, though admits she's been rarely taking her levothyroxine .  Encouraged to stay clear with PCP about what she is and is not taking.  Otherwise, seems the case that more water , off cola is good for her kidney function.    In closing, discussed diagnosis, lifelong history of negative self-esteem, and roots in childhood rejection, including mother's accusation she was sleeping with her sF.  Tearfully acknowledges hx of wasteful choices and wondering if her life has amounted to anything.    Therapeutic modalities: Cognitive Behavioral Therapy, Solution-Oriented/Positive Psychology, Environmental manager,  and Faith-sensitive  Mental Status/Observations:  Appearance:   Casual     Behavior:  Appropriate  Motor:  Rollator  Speech/Language:   Clear and Coherent  Affect:  Appropriate  Mood:  dysthymic  Thought process:  normal  Thought content:    WNL  Sensory/Perceptual disturbances:    WNL  Orientation:  Fully oriented  Attention:  Good    Concentration:  Fair  Memory:  WNL  Insight:    Good  Judgment:   Variable  Impulse Control:  Variable   Risk Assessment: Danger to Self: No Self-injurious Behavior: No Danger to Others: No Physical Aggression / Violence: No Duty to Warn: No Access to Firearms a concern: No  Assessment of progress:  progressing  Diagnosis:   ICD-10-CM   1. Major depressive disorder, recurrent, in partial remission (HCC)  F33.41    with suggestion of Dysthymia, early onset    2. Relationship problem between partners  Z63.0     3. Multiple comorbid health conditions  R69     4. Stage 3b chronic kidney disease [facility])  N18.32    May be improved at this time     Plan:  Socialization and relationships -- Endorse abstinence from dysfunctional relationships with men who use for sex, even if enjoyed, as they turn up empty.  Endorse limits with dysfunctional relatives as well, just encourage a standard of speaking limits, not just enacting them silently.  Endorse boundary/limits with Alfonso.  Endorse adoptive family relationships where present and strong friendships as enlarged family.  Continue involvement in church and suitable groups as able.  Develop local friendships and bonus mother-daughter relationship at interest.  Offer to support groups such as CoDA or Al-Anon, and active elder facilities like [employer], [employer].  Endorse option to work part-time if truly able and interested.  Endorse choice over dating, so long as she can keep her integrity, maintain the view that she is worth working for and being honest for, and does not have to make  any desperate decisions to feel loved and lovable.  Lonely does not have to be desperate.   Self-esteem -- Per her faith, continue to practice self-esteem grounded in God's unconditional love.  Self-affirm that whatever she might deem stupid, she has either learned, improved, or was doing the best she could figure out at the time, while trying to deal with strong and natural feelings, e.g., loneliness.  When tempted to feel like a failure for lack of family, or for acting quickly on desires, reframe self-image as a survivor who had to take on the harshest of losses and is legitimately tempted.  Acknowledge regrets when they come up, and reaffirm having learned from them.  Options to inventory wrongs a la 4th and 5th steps of AA and to write letters, or write and burn confessions, as motivated, but stay true that taking care of herself now is itself righting wrongs, retiring regrets, and enacting healthy self-esteem. General health -- Endorse any healthy activity, to include pool/gym if able and interested.  Address diabetes and renal self-care promptly and reliably.  Recommend taming light in late evening and after bed, with options to use soothing sound and orange lenses. Living situation -- Endorse researching more reliable, better suited living space, and as needed, research tenant's rights in the event landlady defaults on the mortgage.  Most likely no longer in the market for home ownership.  For housekeeping, try to make a practice of smaller, more frequent efforts and/or bringing in help as needed, with the mindset that it's all a gift to the lady who lives here.   Other recommendations/advice -- As may be noted above.  Continue to utilize previously learned skills ad lib. Medication compliance -- Maintain medication as prescribed and work faithfully with relevant prescriber(s) if any changes are desired or seem indicated. Crisis service -- Aware of call list and work-in appts.  Call the clinic  on-call service, 988/hotline, 911, or present to [facility] or ER if any life-threatening psychiatric crisis. Followup -- Return for time as already scheduled.  Next scheduled visit with me 09/21/2023.  Next scheduled in this office 09/21/2023.  Lamar Kendall, PhD Jodie Kendall, PhD LP Clinical Psychologist, [facility] [address], [address] [place], [place] [address] [phone]

## 2023-09-09 ENCOUNTER — Other Ambulatory Visit: Payer: Self-pay | Admitting: Internal Medicine

## 2023-09-15 ENCOUNTER — Telehealth: Payer: Self-pay

## 2023-09-16 ENCOUNTER — Ambulatory Visit
Admission: RE | Admit: 2023-09-16 | Discharge: 2023-09-16 | Disposition: A | Discharge status: Home / Self Care | Source: Ambulatory Visit | Attending: Internal Medicine | Admitting: Internal Medicine

## 2023-09-16 DIAGNOSIS — K7460 Unspecified cirrhosis of liver: Secondary | ICD-10-CM

## 2023-09-21 ENCOUNTER — Ambulatory Visit: Payer: Self-pay | Admitting: Internal Medicine

## 2023-09-21 ENCOUNTER — Ambulatory Visit (INDEPENDENT_AMBULATORY_CARE_PROVIDER_SITE_OTHER): Admitting: Psychiatry

## 2023-09-21 ENCOUNTER — Encounter: Payer: Self-pay | Admitting: Internal Medicine

## 2023-09-21 ENCOUNTER — Telehealth: Payer: Self-pay

## 2023-09-21 DIAGNOSIS — N1832 Chronic kidney disease, stage 3b: Secondary | ICD-10-CM | POA: Diagnosis not present

## 2023-09-21 DIAGNOSIS — R69 Illness, unspecified: Secondary | ICD-10-CM | POA: Diagnosis not present

## 2023-09-21 DIAGNOSIS — K769 Liver disease, unspecified: Secondary | ICD-10-CM

## 2023-09-21 DIAGNOSIS — Z599 Problem related to housing and economic circumstances, unspecified: Secondary | ICD-10-CM

## 2023-09-21 DIAGNOSIS — Z794 Long term (current) use of insulin: Secondary | ICD-10-CM

## 2023-09-21 DIAGNOSIS — E1122 Type 2 diabetes mellitus with diabetic chronic kidney disease: Secondary | ICD-10-CM

## 2023-09-21 DIAGNOSIS — Z630 Problems in relationship with spouse or partner: Secondary | ICD-10-CM

## 2023-09-21 DIAGNOSIS — F3341 Major depressive disorder, recurrent, in partial remission: Secondary | ICD-10-CM | POA: Diagnosis not present

## 2023-09-21 NOTE — Progress Notes (Signed)
**Note Teresa-Identified via Obfuscation**  Complex Care Management Care Guide Note  09/21/2023 Name: [name] MRN: [id number] DOB: December 20, 1945  Teresa Golden is a 78 y.o. year old female who is a primary care patient of Jarold Medici, MD and is actively engaged with the care management team. I reached out to Teresa Golden by phone today to assist with re-scheduling  with the RN Case Manager.  Follow up plan: Unsuccessful telephone outreach attempt made. A HIPAA compliant phone message was left for the patient providing contact information and requesting a return call.  Teresa Golden, [facility] Guide  Direct Dial: [phone]  Fax [phone]

## 2023-09-21 NOTE — Progress Notes (Signed)
 Psychotherapy Progress Note [facility] Jodie Kendall, PhD LP  Patient ID: CANDY LEVERETT [facility])    MRN: [id number] Therapy format: Individual psychotherapy Date: 09/21/2023      Start: 9:15a     Stop: 10:05a     Time Spent: 50 min Location: In-person   Session narrative (presenting needs, interim history, self-report of stressors and symptoms, applications of prior therapy, status changes, and interventions made in session) Small talk about the heat, doing laundry, possibly grilling this weekend, and preparing food for a church event.    On her mind today is a hematoma on her liver.  Can't get into MyChart for results of ultrasound due to misplacing password.  Facilitated MyChart inquiry and looked up results for her -- stable liver lesion same as known in 2014 and 2024, defer to physician.  Concern for church dinner wherein friend Zada seemed to be going way too cheap in her role as host, so [name] decided to just take herself off the list to go.  Probed whether she would like to provide feedback (no) and whether there is more to the story of cancelling the occasion (cagey, dilatory, eventually mentions various habits like Joy being late for pickup to church).  Allowed discretion handling her own friendship, gentle reminder that the best chance of sending a reliable message is to put some kind of words of explanation on it.  Meanwhile, warming up again to move, having tired further of landlady who takes very long to respond and keeps to cheap maintenance.  Has looked at a complex in [place] that would run about $300 more than currently.  Feels she could swing it, encouraged to make sure she does the math.  Probed issue of healthcare workers seeming to have trouble reaching her (per EHR) -- says it's illusory, she spoke with them already.  Message fielded end of session, still seeking to reschedule her call with the nurse who had documented trying to reach her.  Still  talks with Exie, she admits.  No sex.   Allowed choice.  Otherwise, says she's doing well overall.  Growing into the new church, serving yesterday as a first time prayer partner.    Outlook for the next couple weeks is to just keep integrating into church and research housing and moving plans.    Therapeutic modalities: Cognitive Behavioral Therapy, Solution-Oriented/Positive Psychology, and Ego-Supportive  Mental Status/Observations:  Appearance:   Casual     Behavior:  Appropriate  Motor:  Rollator  Speech/Language:   Clear and Coherent  Affect:  Appropriate  Mood:  dysthymic  Thought process:  normal  Thought content:    WNL  Sensory/Perceptual disturbances:    WNL  Orientation:  Fully oriented  Attention:  Good    Concentration:  Fair  Memory:  WNL  Insight:    Variable  Judgment:   Good  Impulse Control:  Variable   Risk Assessment: Danger to Self: No Self-injurious Behavior: No Danger to Others: No Physical Aggression / Violence: No Duty to Warn: No Access to Firearms a concern: No  Assessment of progress:  stabilized  Diagnosis:   ICD-10-CM   1. Major depressive disorder, recurrent, in partial remission (HCC)  F33.41     2. Relationship problem between partners  Z63.0     3. Multiple comorbid health conditions  R69     4. Stage 3b chronic kidney disease (HCC)  N18.32     5. Type 2 diabetes mellitus with stage 3b  chronic kidney disease, with long-term current use of insulin  (HCC)  E11.22    N18.32    Z79.4     6. r/o Alcohol  dependence (psychological)  R69     7. Housing or economic circumstances  Z59.9      Plan:  Socialization and relationships -- Endorse abstinence from dysfunctional relationships with men who use for sex, even if enjoyed, as they turn up empty.  Endorse limits with dysfunctional relatives as well, just encourage a standard of speaking limits, not just enacting them silently.  Endorse boundary/limits with Alfonso.  Endorse adoptive  family relationships where present and strong friendships as enlarged family.  Continue involvement in church and suitable groups as able.  Develop local friendships and bonus mother-daughter relationship at interest.  Offer to support groups such as CoDA or Al-Anon, and active elder facilities like [employer], [employer].  Endorse option to work part-time if truly able and interested.  Endorse choice over dating, so long as she can keep her integrity, maintain the view that she is worth working for and being honest for, and does not have to make any desperate decisions to feel loved and lovable.  Lonely does not have to be desperate.   Self-esteem -- Per her faith, continue to practice self-esteem grounded in God's unconditional love.  Self-affirm that whatever she might deem stupid, she has either learned, improved, or was doing the best she could figure out at the time, while trying to deal with strong and natural feelings, e.g., loneliness.  When tempted to feel like a failure for lack of family, or for acting quickly on desires, reframe self-image as a survivor who had to take on the harshest of losses and is legitimately tempted.  Acknowledge regrets when they come up, and reaffirm having learned from them.  Options to inventory wrongs a la 4th and 5th steps of AA and to write letters, or write and burn confessions, as motivated, but stay true that taking care of herself now is itself righting wrongs, retiring regrets, and enacting healthy self-esteem. General health -- Endorse any healthy activity, to include pool/gym if able and interested.  Address diabetes and renal self-care promptly and reliably.  Recommend taming light in late evening and after bed, with options to use soothing sound and orange lenses. Living situation -- Endorse researching more reliable, better suited living space, and as needed.  As needed, research tenant's rights in the event landlady defaults on the mortgage.   Most likely no longer in the market for home ownership.  For housekeeping, try to make a practice of smaller, more frequent efforts and/or bringing in help as needed, with the mindset that it's all a gift to the lady who lives here.   Other recommendations/advice -- As may be noted above.  Continue to utilize previously learned skills ad lib. Medication compliance -- Maintain medication as prescribed and work faithfully with relevant prescriber(s) if any changes are desired or seem indicated. Crisis service -- Aware of call list and work-in appts.  Call the clinic on-call service, 988/hotline, 911, or present to [facility] or ER if any life-threatening psychiatric crisis. Followup -- Return for time as already scheduled.  Next scheduled visit with me 10/05/2023.  Next scheduled in this office 10/05/2023.  Lamar Kendall, PhD Jodie Kendall, PhD LP Clinical Psychologist, [facility] [address] [place], [place] [address] [phone]

## 2023-09-22 ENCOUNTER — Telehealth: Payer: Self-pay

## 2023-09-22 NOTE — Telephone Encounter (Signed)
 Reorder form request sent to provider for signature for Novo fine needles and Ozempic , will forward to [facility] [facility] when received.

## 2023-09-24 ENCOUNTER — Encounter: Payer: Self-pay | Admitting: Pharmacist

## 2023-09-24 NOTE — Progress Notes (Signed)
   09/24/2023  [name], female   DOB: 1946-01-21, 78 y.o.   MRN: [id number]  Received notice from [facility] [facility] that a refill/reorder/change request form was necessary for the Patient. They have a new process that started 09/18/2023.  The requested form was completed and provided to the PCP for signature. Form will be faxed back to [employer].  Completed form will be scanned into the Patient's chart under the media tab.   A1c-6.4%   Teresa Golden, PharmD, BCACP Clinical Pharmacist [phone]  Teresa Golden, PharmD, BCACP Clinical Pharmacist [phone]

## 2023-09-30 ENCOUNTER — Ambulatory Visit: Admitting: Podiatry

## 2023-10-05 ENCOUNTER — Ambulatory Visit (INDEPENDENT_AMBULATORY_CARE_PROVIDER_SITE_OTHER): Admitting: Psychiatry

## 2023-10-05 DIAGNOSIS — R69 Illness, unspecified: Secondary | ICD-10-CM | POA: Diagnosis not present

## 2023-10-05 DIAGNOSIS — Z599 Problem related to housing and economic circumstances, unspecified: Secondary | ICD-10-CM | POA: Diagnosis not present

## 2023-10-05 DIAGNOSIS — F3341 Major depressive disorder, recurrent, in partial remission: Secondary | ICD-10-CM

## 2023-10-05 NOTE — Progress Notes (Signed)
 Psychotherapy Progress Note [facility] Jodie Kendall, PhD LP  Patient ID: NETTIE WYFFELS [facility])    MRN: [id number] Therapy format: Individual psychotherapy Date: 10/05/2023      Start: 9:16a     Stop: 10:03a     Time Spent: 47 min Location: In-person   Session narrative (presenting needs, interim history, self-report of stressors and symptoms, applications of prior therapy, status changes, and interventions made in session) Slew of home repair needs this weekend including fridge, water  faucet aerator, and popped a surge protector that had freezer, TV, coffee, and microwave all plugged into it.  4th repair on refrigerator, with word that it's on its last legs.  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  Discovered through [employer] there is a grant available to update the bathroom, would require landlady's approval, initially turned down but last week indicated maybe interest at lease signing last week.  Feeling a bit too late at this point.  With all of the above, reignited her desire to move out in the coming year.  Has a visit scheduled at [place] appts.  Support/validation provided.   Social events warming up -- large 12th birthday party for son of friends in [place], church gatherings coming.  Feeling a little bit run ragged preparing.  More personally, still bothered by [name] calling and wearing on her with what she sees as his backwards, country mentality, quickness to decide it's her problem if communication not working.  Has gotten herself clear that she was right not to go take up with [place] in [place], [place] -- a very far cry from her [place] roots -- but just now gearing up to stop taking his still-daily calls.  Asserts again that it's only good sex has held them together over time, and that ended in March.  Originally met as friend of her housekeeper's boyfriend, after she was 11yrs celibate and admittedly hungry.   Initially turned off but wowed a bit by his effort and signs of a little worldly experience, but over time he's eroded in virtually all ways.  Affirmed and encouraged rightsizing her attachments and managing vulnerability.  Therapeutic modalities: Cognitive Behavioral Therapy, Solution-Oriented/Positive Psychology, Environmental manager, and Faith-sensitive  Mental Status/Observations:  Appearance:   Casual     Behavior:  Appropriate  Motor:  Rollator  Speech/Language:   Clear and Coherent  Affect:  Appropriate  Mood:  normal  Thought process:  normal  Thought content:    WNL  Sensory/Perceptual disturbances:    WNL  Orientation:  Fully oriented  Attention:  Good    Concentration:  Fair  Memory:  WNL  Insight:    Variable  Judgment:   Good  Impulse Control:  Fair   Risk Assessment: Danger to Self: No Self-injurious Behavior: No Danger to Others: No Physical Aggression / Violence: No Duty to Warn: No Access to Firearms a concern: No  Assessment of progress:  progressing  Diagnosis:   ICD-10-CM   1. Major depressive disorder, recurrent, in partial remission (HCC)  F33.41     2. Multiple comorbid health conditions  R69     3. Housing or economic circumstances  Z59.9      Plan:  Socialization and relationships -- Endorse abstinence from dysfunctional relationships with men who use for sex, even if enjoyed, as they turn up empty.  Endorse limits with dysfunctional relatives as well, just encourage a standard of speaking limits, not just enacting them silently.  Endorse boundary/limits with Alfonso.  Endorse adoptive family relationships where present and strong friendships as enlarged family.  Continue involvement in church and suitable groups as able.  Develop local friendships and bonus mother-daughter relationship at interest.  Offer to support groups such as CoDA or Al-Anon, and active elder facilities like [employer], [employer].  Endorse option to work part-time  if truly able and interested.  Endorse choice over dating, so long as she can keep her integrity, maintain the view that she is worth working for and being honest for, and does not have to make any desperate decisions to feel loved and lovable.  Lonely does not have to be desperate.   Self-esteem -- Per her faith, continue to practice self-esteem grounded in God's unconditional love.  Self-affirm that whatever she might deem stupid, she has either learned, improved, or was doing the best she could figure out at the time, while trying to deal with strong and natural feelings, e.g., loneliness.  When tempted to feel like a failure for lack of family, or for acting quickly on desires, reframe self-image as a survivor who had to take on the harshest of losses and is legitimately tempted.  Acknowledge regrets when they come up, and reaffirm having learned from them.  Options to inventory wrongs a la 4th and 5th steps of AA and to write letters, or write and burn confessions, as motivated, but stay true that taking care of herself now is itself righting wrongs, retiring regrets, and enacting healthy self-esteem. General health -- Endorse any healthy activity, to include pool/gym if able and interested.  Address diabetes and renal self-care promptly and reliably.  Recommend taming light in late evening and after bed, with options to use soothing sound and orange lenses. Living situation -- Endorse researching more reliable, better suited living space, and as needed.  As needed, research tenant's rights in the event landlady defaults on the mortgage.  Most likely no longer in the market for home ownership.  For housekeeping, try to make a practice of smaller, more frequent efforts and/or bringing in help as needed, with the mindset that it's all a gift to the lady who lives here.   Other recommendations/advice -- As may be noted above.  Continue to utilize previously learned skills ad lib. Medication compliance --  Maintain medication as prescribed and work faithfully with relevant prescriber(s) if any changes are desired or seem indicated. Crisis service -- Aware of call list and work-in appts.  Call the clinic on-call service, 988/hotline, 911, or present to [facility] or ER if any life-threatening psychiatric crisis. Followup -- Return for time as already scheduled.  Next scheduled visit with me 10/19/2023.  Next scheduled in this office 10/19/2023.  Lamar Kendall, PhD Jodie Kendall, PhD LP Clinical Psychologist, [facility] [address] [place], [place] [address] [phone]

## 2023-10-12 ENCOUNTER — Encounter: Payer: Self-pay | Admitting: Internal Medicine

## 2023-10-12 ENCOUNTER — Ambulatory Visit: Admitting: Internal Medicine

## 2023-10-12 VITALS — BP 108/78 | HR 67 | Temp 98.3°F | Ht 65.0 in | Wt 214.2 lb

## 2023-10-12 DIAGNOSIS — R160 Hepatomegaly, not elsewhere classified: Secondary | ICD-10-CM | POA: Diagnosis not present

## 2023-10-12 DIAGNOSIS — N1831 Chronic kidney disease, stage 3a: Secondary | ICD-10-CM

## 2023-10-12 DIAGNOSIS — Z9989 Dependence on other enabling machines and devices: Secondary | ICD-10-CM

## 2023-10-12 DIAGNOSIS — Z794 Long term (current) use of insulin: Secondary | ICD-10-CM

## 2023-10-12 DIAGNOSIS — E66812 Obesity, class 2: Secondary | ICD-10-CM

## 2023-10-12 DIAGNOSIS — E1122 Type 2 diabetes mellitus with diabetic chronic kidney disease: Secondary | ICD-10-CM | POA: Diagnosis not present

## 2023-10-12 MED ORDER — EMPAGLIFLOZIN 25 MG PO TABS: 25.0000 mg | ORAL_TABLET | Freq: Every day | ORAL | Status: AC

## 2023-10-12 NOTE — Progress Notes (Signed)
 I,[name], CMA,acting as a Neurosurgeon for [name], MD.,have documented all relevant documentation on the behalf of [name], MD,as directed by  [name], MD while in the presence of [name], MD.  Subjective:  [name] , female    DOB: 1945-06-26 , 78 y.o.   MRN: [id number]  Chief Complaint  Patient presents with   Diabetes    Patient presents today for Jardiance  follow up. She reports compliance with medications. Denies headache, chest pain & sob.    HPI Discussed the use of AI scribe software for clinical note transcription with the patient, who gave verbal consent to proceed.  History of Present Illness Teresa Golden is a 78 year old female with diabetes who presents for a follow-up visit to evaluate her response to Jardiance .  She finds Jardiance  more effective than Farxiga , particularly in terms of reducing bladder issues. Her blood sugar levels have been stable, with the exception of a spike to 300 mg/dL due to stress and consuming candy bars, which she typically avoids. The levels normalized the following day. She has been taking Jardiance  25 mg, which she received in the mail about a week or two ago, and she is not experiencing any bladder issues with the higher dose.  She has a history of alopecia, which she attributes to long-term use of high blood pressure medication. She manages this with hair pieces and wigs but finds them uncomfortable in hot weather.  She has a known hemangioma that was biopsied years ago and found to be benign. She is scheduled for an MRI on August 28 to confirm the stability of the lesion.  She is experiencing stress related to issues with her landlord, including a malfunctioning refrigerator and the landlord's refusal to allow modifications for handicap accessibility in her apartment.   Diabetes She presents for her follow-up diabetic visit. She has type 2 diabetes mellitus. Pertinent negatives  for diabetes include no blurred vision. There are no hypoglycemic complications. Risk factors for coronary artery disease include diabetes mellitus, dyslipidemia, hypertension, obesity, sedentary lifestyle and post-menopausal. She is following a diabetic diet. She participates in exercise intermittently. Her home blood glucose trend is fluctuating minimally. Her breakfast blood glucose is taken between 8-9 am. Her breakfast blood glucose range is generally 110-130 mg/dl. An ACE inhibitor/angiotensin II receptor blocker is being taken. Eye exam is current.  Hypertension This is a chronic problem. The current episode started more than 1 year ago. The problem has been gradually improving since onset. The problem is uncontrolled. Pertinent negatives include no blurred vision. Risk factors for coronary artery disease include diabetes mellitus, dyslipidemia, post-menopausal state and sedentary lifestyle. Past treatments include angiotensin blockers and beta blockers. The current treatment provides moderate improvement.     Past Medical History:  Diagnosis Date   Arthritis    Chronic kidney disease    self reports ckd stage 3    Depression    Diabetes mellitus, type II, insulin  dependent (HCC)    With neurologic complications. 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    Hypertensive nephropathy 07/05/2018   Hypothyroidism    Hypothyroidism 02/06/2018   Morbid obesity with BMI of 50.0-59.9,  adult [facility])    Scoliosis      Family History  Problem Relation Age of Onset   Heart attack Mother    Heart failure Mother    Stroke Father    Breast cancer Neg Hx    BRCA 1/2 Neg Hx      Current Outpatient Medications:    allopurinol  (ZYLOPRIM ) 100 MG tablet, Take 1  tablet by mouth once daily, Disp: 270 tablet, Rfl: 0   amLODipine  (NORVASC ) 5 MG tablet, Take 1 tablet by mouth once daily, Disp: 90 tablet, Rfl: 2   augmented betamethasone  dipropionate (DIPROLENE -AF) 0.05 % cream, APPLY CREAM TOPICALLY TO AFFECTED AREA TWICE DAILY, Disp: , Rfl:    AZO-CRANBERRY PO, Take 1 tablet by mouth daily., Disp: , Rfl:    cetirizine  (ZYRTEC ) 10 MG tablet, Take 1 tablet (10 mg total) by mouth daily. (Patient taking differently: Take 10 mg by mouth daily as needed for allergies.), Disp: 30 tablet, Rfl: 1   Cholecalciferol 5000 units TABS, Take 5,000 Units by mouth daily., Disp: , Rfl:    diclofenac  Sodium (VOLTAREN ) 1 % GEL, APPLY 2 GRAMS TO AFFECTED AREA(S) 4 TIMES DAILY AS NEEDED (Patient taking differently: Apply 2-4 g topically every 6 (six) hours as needed (to affected areas).), Disp: 200 g, Rfl: 2   empagliflozin  (JARDIANCE ) 25 MG TABS tablet, Take 1 tablet (25 mg total) by mouth daily before breakfast., Disp: , Rfl:    gabapentin  (NEURONTIN ) 300 MG capsule, Take 2 capsules (600 mg total) by mouth at bedtime., Disp: 180 capsule, Rfl: 2   glucose blood (ONETOUCH VERIO) test strip, USE TO CHECK BLOOD SUGAR   TWO TIMES A DAY AS         INSTRUCTED, Disp: 200 strip, Rfl: 3   Insulin  Degludec (TRESIBA ) 100 UNIT/ML SOLN, Inject 10 Units/oz/day into the skin at bedtime. Patient is taking at bedtime, Disp: , Rfl:    Lancets (ONETOUCH DELICA PLUS LANCET33G) MISC, USE TO CHECK BLOOD SUGARS  TWO TIMES A DAY AS         INSTRUCTED, Disp: 200 each, Rfl: 3   levothyroxine  (SYNTHROID ) 25 MCG tablet, Take 1/2 tablet daily., Disp: 90 tablet, Rfl: 1   losartan  (COZAAR ) 100 MG tablet, TAKE 1 TABLET DAILY, Disp: 90 tablet, Rfl: 2   metoprolol  succinate (TOPROL -XL) 25 MG 24 hr tablet, TAKE 1 TABLET AT BEDTIME   (DISCONTINUE COREG ), Disp: 90 tablet, Rfl: 1   Multiple Vitamins-Minerals (MULTIVITAMIN WITH MINERALS) tablet, Take 1 tablet by mouth daily. Women's 50+, Disp: , Rfl:    pantoprazole   (PROTONIX ) 40 MG tablet, Take 40 mg by mouth daily as needed., Disp: , Rfl:    Semaglutide , 1 MG/DOSE, (OZEMPIC , 1 MG/DOSE,) 4 MG/3ML SOPN, Inject 1 mg into the skin once a week., Disp: 9 mL, Rfl: 1   simvastatin  (ZOCOR ) 10 MG tablet, Take 1 tablet by mouth once daily, Disp: 90 tablet, Rfl: 2   triamcinolone ointment (KENALOG) 0.1 %, Apply 1 Application topically 2 (two) times daily., Disp: , Rfl:    Allergies  Allergen Reactions   Meloxicam Other (See Comments)    Fever; muscle aches; flu-like symptoms Allergic, per MAR   Grass Pollen(K-O-R-T-Swt Vern) Other (See Comments)    Sinus inflammation    Other Other (See Comments)    Rose fever and hay fever     Review of Systems  Constitutional: Negative.   Eyes:  Negative for blurred vision.  Respiratory: Negative.    Cardiovascular: Negative.   Gastrointestinal: Negative.  Neurological: Negative.   Psychiatric/Behavioral: Negative.       Today's Vitals   10/12/23 1144  BP: 108/78  Pulse: 67  Temp: 98.3 F (36.8 C)  SpO2: 98%  Weight: 214 lb 3.2 oz (97.2 kg)  Height: 5' 5 (1.651 m)   Body mass index is 35.64 kg/m.  Wt Readings from Last 3 Encounters:  10/12/23 214 lb 3.2 oz (97.2 kg)  09/01/23 209 lb (94.8 kg)  06/16/23 209 lb (94.8 kg)     Objective:  Physical Exam Vitals and nursing note reviewed.  Constitutional:      Appearance: Normal appearance.  HENT:     Head: Normocephalic and atraumatic.     Mouth/Throat:     Pharynx: No posterior oropharyngeal erythema.  Eyes:     Extraocular Movements: Extraocular movements intact.  Cardiovascular:     Rate and Rhythm: Normal rate and regular rhythm.     Heart sounds: Normal heart sounds.  Pulmonary:     Effort: Pulmonary effort is normal.     Breath sounds: Normal breath sounds.  Abdominal:     General: Bowel sounds are normal.     Palpations: Abdomen is soft.  Musculoskeletal:     Cervical back: Normal range of motion.  Skin:    General: Skin is  warm.  Neurological:     General: No focal deficit present.     Mental Status: She is alert.  Psychiatric:        Mood and Affect: Mood normal.        Behavior: Behavior normal.         Assessment And Plan:  Type 2 diabetes mellitus with stage 3a chronic kidney disease, with long-term current use of insulin  (HCC) Assessment & Plan: Managed with Jardiance  25 mg. Improved bladder symptoms compared to Farxiga . Transient hyperglycemia due to stress and diet, resolved spontaneously. - Discontinue Farxiga  from chart. - Continue Jardiance  25 mg daily. - Obtain blood sample for kidney function. - Schedule follow-up in three months for diabetes management and A1c evaluation.  Orders: -     BMP8+EGFR  Liver mass Assessment & Plan: Stable right lobe hemangioma. Ultrasound consistent with known lesion. MRI scheduled to confirm stability and exclude hepatoma. - Proceed with MRI of the abdomen on August 28 to confirm stability of liver hemangioma.   Walker as ambulation aid  Other orders -     Empagliflozin ; Take 1 tablet (25 mg total) by mouth daily before breakfast.   Return in 3 months (on 01/12/2024), or dm check.  Patient was given opportunity to ask questions. Patient verbalized understanding of the plan and was able to repeat key elements of the plan. All questions were answered to their satisfaction.  [name], MD  I, [name], MD, have reviewed all documentation for this visit. The documentation on 10/17/23 for the exam, diagnosis, procedures, and orders are all accurate and complete.   IF YOU HAVE BEEN REFERRED TO A SPECIALIST, IT MAY TAKE 1-2 WEEKS TO SCHEDULE/PROCESS THE REFERRAL. IF YOU HAVE NOT HEARD FROM US /SPECIALIST IN TWO WEEKS, PLEASE GIVE US  A CALL AT [phone].   THE PATIENT IS ENCOURAGED TO PRACTICE SOCIAL DISTANCING DUE TO THE COVID-19 PANDEMIC.

## 2023-10-12 NOTE — Patient Instructions (Signed)

## 2023-10-13 LAB — BMP8+EGFR
BUN/Creatinine Ratio: 14 (ref 12–28)
BUN: 19 mg/dL (ref 8–27)
CO2: 20 mmol/L (ref 20–29)
Calcium: 9.5 mg/dL (ref 8.7–10.3)
Chloride: 105 mmol/L (ref 96–106)
Creatinine, Ser: 1.32 mg/dL — ABNORMAL HIGH (ref 0.57–1.00)
Glucose: 128 mg/dL — ABNORMAL HIGH (ref 70–99)
Potassium: 4.4 mmol/L (ref 3.5–5.2)
Sodium: 140 mmol/L (ref 134–144)
eGFR: 41 mL/min/1.73 — ABNORMAL LOW (ref >59)

## 2023-10-15 ENCOUNTER — Ambulatory Visit: Payer: Self-pay | Admitting: Internal Medicine

## 2023-10-16 ENCOUNTER — Encounter: Payer: Self-pay | Admitting: Internal Medicine

## 2023-10-17 DIAGNOSIS — R160 Hepatomegaly, not elsewhere classified: Secondary | ICD-10-CM | POA: Insufficient documentation

## 2023-10-17 NOTE — Assessment & Plan Note (Signed)
 Managed with Jardiance  25 mg. Improved bladder symptoms compared to Farxiga . Transient hyperglycemia due to stress and diet, resolved spontaneously. - Discontinue Farxiga  from chart. - Continue Jardiance  25 mg daily. - Obtain blood sample for kidney function. - Schedule follow-up in three months for diabetes management and A1c evaluation.

## 2023-10-17 NOTE — Assessment & Plan Note (Signed)
 Stable right lobe hemangioma. Ultrasound consistent with known lesion. MRI scheduled to confirm stability and exclude hepatoma. - Proceed with MRI of the abdomen on August 28 to confirm stability of liver hemangioma.

## 2023-10-19 ENCOUNTER — Ambulatory Visit: Admitting: Psychiatry

## 2023-10-22 ENCOUNTER — Ambulatory Visit
Admission: RE | Admit: 2023-10-22 | Discharge: 2023-10-22 | Disposition: A | Discharge status: Home / Self Care | Source: Ambulatory Visit | Attending: Internal Medicine | Admitting: Internal Medicine

## 2023-10-22 DIAGNOSIS — K769 Liver disease, unspecified: Secondary | ICD-10-CM

## 2023-10-22 MED ORDER — GADOPICLENOL 0.5 MMOL/ML IV SOLN
10.0000 mL | Freq: Once | INTRAVENOUS | Status: AC | PRN
  Administered 2023-10-22: 10 mL via INTRAVENOUS

## 2023-10-23 ENCOUNTER — Other Ambulatory Visit: Payer: Self-pay | Admitting: Lab

## 2023-10-23 ENCOUNTER — Telehealth: Payer: Self-pay | Admitting: Podiatry

## 2023-10-23 ENCOUNTER — Other Ambulatory Visit: Payer: Self-pay | Admitting: Podiatry

## 2023-10-23 MED ORDER — GABAPENTIN 300 MG PO CAPS
600.0000 mg | ORAL_CAPSULE | Freq: Every day | ORAL | 0 refills | Status: AC
  Filled 2024-02-12 – 2024-03-28 (×2): qty 180, 90d supply, fill #0

## 2023-10-23 NOTE — Telephone Encounter (Signed)
 Patient called in stating she needs a script for gabapentin . She only has a few pills left and wants to make sure she'll have enough over the holiday weekend.   Her original script was written by Dr.Sikora but she's requesting this from you because you are her doctor now. She no longer sees Dr.Sikora.

## 2023-10-23 NOTE — Telephone Encounter (Signed)
 Refill sent.

## 2023-10-23 NOTE — Telephone Encounter (Signed)
 Patients needs a refill for gabapentin . She has a few pills left.

## 2023-10-23 NOTE — Telephone Encounter (Signed)
 Will do Dr. Loreda.

## 2023-10-23 NOTE — Telephone Encounter (Signed)
 This message should go to Dr. Tobie because he originally wrote the prescription. Thank you.

## 2023-10-25 ENCOUNTER — Ambulatory Visit: Payer: Self-pay | Admitting: Internal Medicine

## 2023-10-25 DIAGNOSIS — K769 Liver disease, unspecified: Secondary | ICD-10-CM

## 2023-10-27 ENCOUNTER — Telehealth: Payer: Self-pay

## 2023-10-27 NOTE — Patient Outreach (Signed)
 Unable to reach patient to assess for goal outcomes after 3 unsuccessful attempts. Closed patient from complex case management.   Clayborne Ly RN BSN CCM [place]  [facility], [facility] Health Nurse Care Coordinator  Direct Dial: [phone] Website: [email or web address]  '

## 2023-10-28 ENCOUNTER — Ambulatory Visit: Payer: Medicare HMO

## 2023-10-28 DIAGNOSIS — Z0000 Encounter for general adult medical examination without abnormal findings: Secondary | ICD-10-CM | POA: Diagnosis not present

## 2023-10-28 NOTE — Patient Instructions (Signed)
 Teresa Golden , Thank you for taking time out of your busy schedule to complete your Annual Wellness Visit with me. I enjoyed our conversation and look forward to speaking with you again next year. I, as well as your care team,  appreciate your ongoing commitment to your health goals. Please review the following plan we discussed and let me know if I can assist you in the future. Your Game plan/ To Do List    Referrals: If you haven't heard from the office you've been referred to, please reach out to them at the phone provided.   Follow up Visits: We will see or speak with you next year for your Next Medicare AWV with our clinical staff Have you seen your provider in the last 6 months (3 months if uncontrolled diabetes)? Yes  Clinician Recommendations:  Aim for 30 minutes of exercise or brisk walking, 6-8 glasses of water , and 5 servings of fruits and vegetables each day.       This is a list of the screenings recommended for you:  Health Maintenance  Topic Date Due   Eye exam for diabetics  03/05/2023   Complete foot exam   06/23/2023   Flu Shot  09/25/2023   COVID-19 Vaccine (6 - 2025-26 season) 10/26/2023   Hemoglobin A1C  03/03/2024   Yearly kidney health urinalysis for diabetes  08/31/2024   Yearly kidney function blood test for diabetes  10/11/2024   Medicare Annual Wellness Visit  10/27/2024   DTaP/Tdap/Td vaccine (2 - Td or Tdap) 07/03/2025   Pneumococcal Vaccine for age over 50  Completed   DEXA scan (bone density measurement)  Completed   Hepatitis C Screening  Completed   Zoster (Shingles) Vaccine  Completed   HPV Vaccine  Aged Out   Meningitis B Vaccine  Aged Out   Colon Cancer Screening  Discontinued    Advanced directives: (Copy Requested) Please bring a copy of your health care power of attorney and living will to the office to be added to your chart at your convenience. You can mail to [facility] [address] [place], [place] [address] or email to  [email or web address] Advance Care Planning is important because it:  [x]  Makes sure you receive the medical care that is consistent with your values, goals, and preferences  [x]  It provides guidance to your family and loved ones and reduces their decisional burden about whether or not they are making the right decisions based on your wishes.  Follow the link provided in your after visit summary or read over the paperwork we have mailed to you to help you started getting your Advance Directives in place. If you need assistance in completing these, please reach out to us  so that we can help you!  See attachments for Preventive Care and Fall Prevention Tips.

## 2023-10-28 NOTE — Progress Notes (Signed)
 Subjective:   Teresa Golden is a 78 y.o. who presents for a Medicare Wellness preventive visit.  As a reminder, Annual Wellness Visits don't include a physical exam, and some assessments may be limited, especially if this visit is performed virtually. We may recommend an in-person follow-up visit with your provider if needed.  Visit Complete: Virtual I connected with  Teresa Golden on 10/28/23 by a audio enabled telemedicine application and verified that I am speaking with the correct person using two identifiers.  Patient Location: Home  Provider Location: Office/Clinic  I discussed the limitations of evaluation and management by telemedicine. The patient expressed understanding and agreed to proceed.  Vital Signs: Because this visit was a virtual/telehealth visit, some criteria may be missing or patient reported. Any vitals not documented were not able to be obtained and vitals that have been documented are patient reported.  VideoError- Librarian, academic were attempted between this provider and patient, however failed, due to patient having technical difficulties OR patient did not have access to video capability.  We continued and completed visit with audio only.   Persons Participating in Visit: Patient.  AWV Questionnaire: No: Patient Medicare AWV questionnaire was not completed prior to this visit.  Cardiac Risk Factors include: advanced age (>43men, >67 women);diabetes mellitus;hypertension     Objective:    Today's Vitals   There is no height or weight on file to calculate BMI.     10/28/2023    3:00 PM 05/28/2023    9:30 AM 10/22/2022    2:15 PM 10/10/2021    2:44 PM 03/28/2021    9:20 AM 11/03/2020    2:00 AM 11/03/2020   12:51 AM  Advanced Directives  Does Patient Have a Medical Advance Directive? Yes No Yes Yes Yes  Yes  Type of Estate agent of [place];Living will  Healthcare Power of [place];Living will Healthcare  Power of [place];Living will  Healthcare Power of [place];Living will   Does patient want to make changes to medical advance directive?      No - Patient declined   Copy of Healthcare Power of Attorney in Chart? No - copy requested  No - copy requested Yes - validated most recent copy scanned in chart (See row information)     Would patient like information on creating a medical advance directive?  No - Patient declined         Current Medications (verified) Outpatient Encounter Medications as of 10/28/2023  Medication Sig   allopurinol  (ZYLOPRIM ) 100 MG tablet Take 1 tablet by mouth once daily   amLODipine  (NORVASC ) 5 MG tablet Take 1 tablet by mouth once daily   augmented betamethasone  dipropionate (DIPROLENE -AF) 0.05 % cream APPLY CREAM TOPICALLY TO AFFECTED AREA TWICE DAILY   AZO-CRANBERRY PO Take 1 tablet by mouth daily.   cetirizine  (ZYRTEC ) 10 MG tablet Take 1 tablet (10 mg total) by mouth daily. (Patient taking differently: Take 10 mg by mouth daily as needed for allergies.)   Cholecalciferol 5000 units TABS Take 5,000 Units by mouth daily.   diclofenac  Sodium (VOLTAREN ) 1 % GEL APPLY 2 GRAMS TO AFFECTED AREA(S) 4 TIMES DAILY AS NEEDED (Patient taking differently: Apply 2-4 g topically every 6 (six) hours as needed (to affected areas).)   empagliflozin  (JARDIANCE ) 25 MG TABS tablet Take 1 tablet (25 mg total) by mouth daily before breakfast.   gabapentin  (NEURONTIN ) 300 MG capsule Take 2 capsules (600 mg total) by mouth at bedtime.   glucose  blood (ONETOUCH VERIO) test strip USE TO CHECK BLOOD SUGAR   TWO TIMES A DAY AS         INSTRUCTED   Insulin  Degludec (TRESIBA ) 100 UNIT/ML SOLN Inject 10 Units/oz/day into the skin at bedtime. Patient is taking at bedtime   Lancets (ONETOUCH DELICA PLUS LANCET33G) MISC USE TO CHECK BLOOD SUGARS  TWO TIMES A DAY AS         INSTRUCTED   levothyroxine  (SYNTHROID ) 25 MCG tablet Take 1/2 tablet daily.   losartan  (COZAAR ) 100 MG tablet TAKE 1 TABLET  DAILY   metoprolol  succinate (TOPROL -XL) 25 MG 24 hr tablet TAKE 1 TABLET AT BEDTIME   (DISCONTINUE COREG )   Multiple Vitamins-Minerals (MULTIVITAMIN WITH MINERALS) tablet Take 1 tablet by mouth daily. Women's 50+   pantoprazole  (PROTONIX ) 40 MG tablet Take 40 mg by mouth daily as needed.   Semaglutide , 1 MG/DOSE, (OZEMPIC , 1 MG/DOSE,) 4 MG/3ML SOPN Inject 1 mg into the skin once a week.   simvastatin  (ZOCOR ) 10 MG tablet Take 1 tablet by mouth once daily   triamcinolone ointment (KENALOG) 0.1 % Apply 1 Application topically 2 (two) times daily.   [DISCONTINUED] SEMGLEE , YFGN, 100 UNIT/ML Pen Inject 9 Units into the skin at bedtime. (Patient not taking: Reported on 05/28/2023)   No facility-administered encounter medications on file as of 10/28/2023.    Allergies (verified) Meloxicam, Grass pollen([id number]-swt vern), and Other   History: Past Medical History:  Diagnosis Date   Arthritis    Chronic kidney disease    self reports ckd stage 3    Depression    Diabetes mellitus, type II, insulin  dependent (HCC)    With neurologic complications. 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    Hypertensive nephropathy 07/05/2018   Hypothyroidism    Hypothyroidism 02/06/2018   Morbid obesity with BMI of 50.0-59.9, adult (HCC)    Scoliosis    Past Surgical History:  Procedure Laterality Date   ABDOMINAL HYSTERECTOMY     uterus   CHOLECYSTECTOMY     DIAGNOSTIC LAPAROSCOPY     DILATION AND CURETTAGE OF UTERUS     EYE SURGERY     cataract extraction bilateral    INCISION AND DRAINAGE OF WOUND N/A 10/15/2020   Procedure: IRRIGATION AND DEBRIDEMENT WOUND;  Surgeon: Lowery Estefana RAMAN, DO;  Location: [facility] OR;  Service: Plastics;  Laterality: N/A;  60 min    JOINT REPLACEMENT     Left knee   KNEE ARTHROPLASTY Right 07/30/2017   Procedure: RIGHT TOTAL KNEE ARTHROPLASTY WITH COMPUTER NAVIGATION;  Surgeon: Fidel Rogue, MD;  Location: [facility];  Service: Orthopedics;  Laterality: Right;  Needs RNFA   LAPAROSCOPIC GASTRIC BANDING     and reversal   LAPAROSCOPIC GASTRIC SLEEVE RESECTION N/A 08/26/2016   Procedure: LAPAROSCOPIC GASTRIC SLEEVE RESECTION WITH UPPER ENOD;  Surgeon: Gladis Cough, MD;  Location: [facility] OR[facility];  Service: General;  Laterality: N/A;   PANNICULECTOMY N/A 09/26/2020   Procedure: PANNICULECTOMY;  Surgeon: Lowery Estefana RAMAN, DO;  Location: [facility] OR;  Service: Plastics;  Laterality: N/A;   TONSILLECTOMY     TRANSTHORACIC ECHOCARDIOGRAM  11/2013   EF 65-70%. Normal diastolic Fxn.  Normal Valves.   UMBILICAL HERNIA REPAIR N/A 09/26/2020   Procedure:  HERNIA REPAIR UMBILICAL ADULT;  Surgeon: Sebastian Moles, MD;  Location: [facility] OR;  Service: General;  Laterality: N/A;   Family History  Problem Relation Age of Onset   Heart attack Mother    Heart failure Mother    Stroke Father    Breast cancer Neg Hx    BRCA 1/2 Neg Hx    Social History   Socioeconomic History   Marital status: Single    Spouse name: Not on file   Number of children: Not on file   Years of education: Not on file   Highest education level: Not on file  Occupational History   Occupation: retired  Tobacco Use   Smoking status: Former    Current packs/day: 0.25    Average packs/day: 0.3 packs/day for 10.0 years (2.5 ttl pk-yrs)    Types: Cigarettes   Smokeless tobacco: Never   Tobacco comments:    quit 20 years ago  Vaping Use   Vaping status: Never Used  Substance and Sexual Activity   Alcohol  use: Not Currently    Alcohol /week: 1.0 standard drink of alcohol     Types: 1 Glasses of wine per week    Comment: occasional   Drug use: No   Sexual activity: Yes  Other Topics Concern   Not on file  Social History Narrative   Patient is single and lives at  home by her self    Social Drivers of Corporate investment banker Strain: Low Risk  (10/28/2023)   Overall Financial Resource Strain (CARDIA)    Difficulty of Paying Living Expenses: Not hard at all  Food Insecurity: No Food Insecurity (10/28/2023)   Hunger Vital Sign    Worried About Running Out of Food in the Last Year: Never true    Ran Out of Food in the Last Year: Never true  Transportation Needs: No Transportation Needs (10/28/2023)   PRAPARE - Administrator, Civil Service (Medical): No    Lack of Transportation (Non-Medical): No  Physical Activity: Inactive (10/28/2023)   Exercise Vital Sign    Days of Exercise per Week: 0 days    Minutes of Exercise per Session: 0 min  Stress: Stress Concern Present (10/28/2023)   [employer] of Occupational Health - Occupational Stress Questionnaire    Feeling of Stress: To some extent  Social Connections: Unknown (10/28/2023)   Social Connection and Isolation Panel    Frequency of Communication with Friends and Family: More than three times a week    Frequency of Social Gatherings with Friends and Family: Once a week    Attends Religious Services: More than 4 times per year    Active Member of [employer] or Organizations: No    Attends Engineer, structural: Never    Marital Status: Not on file    Tobacco Counseling Counseling given: Not Answered Tobacco comments: quit 20 years ago    Clinical Intake:  Pre-visit preparation completed: Yes  Pain : No/denies pain     Nutritional Risks: Nausea/ vomitting/ diarrhea (acid reflux this morning that made nauseous) Diabetes: Yes CBG done?: No Did pt. bring in CBG monitor from home?: No  Lab Results  Component Value Date   HGBA1C 6.4 (H) 09/01/2023   HGBA1C 6.5 (H) 04/08/2023   HGBA1C 6.6 (H) 10/07/2022     How often do you need to have someone help you when you read instructions, pamphlets, or other written materials from your doctor or pharmacy?: 1 -  Never  Interpreter Needed?:  No  Information entered by :: NAllen LPN   Activities of Daily Living     10/28/2023    2:49 PM  In your present state of health, do you have any difficulty performing the following activities:  Hearing? 0  Vision? 0  Difficulty concentrating or making decisions? 0  Walking or climbing stairs? 0  Dressing or bathing? 0  Doing errands, shopping? 0  Preparing Food and eating ? N  Using the Toilet? N  In the past six months, have you accidently leaked urine? Y  Comment wears pull ups  Do you have problems with loss of bowel control? Y  Comment every once in awhile  Managing your Medications? N  Managing your Finances? N  Housekeeping or managing your Housekeeping? N    Patient Care Team: Jarold Medici, MD as PCP - General (Internal Medicine) Loni Soyla LABOR, MD as PCP - Cardiology (Cardiology) Rudy Dorothyann DASEN, RPH-CPP (Pharmacist) Morgan Clayborne CROME, RN as VBCI Care Management  I have updated your Care Teams any recent Medical Services you may have received from other providers in the past year.     Assessment:   This is a routine wellness examination for Liesel.  Hearing/Vision screen Hearing Screening - Comments:: Denies hearing issues Vision Screening - Comments:: Regular eye exams, [facility]   Goals Addressed             This Visit's Progress    Patient Stated       10/28/2023, would like to start exercising again       Depression Screen     10/28/2023    3:03 PM 10/12/2023   11:45 AM 07/16/2023   11:28 AM 06/18/2023   11:32 AM 06/16/2023    2:13 PM 04/08/2023   11:02 AM 10/22/2022    2:16 PM  PHQ 2/9 Scores  PHQ - 2 Score 1 0 2 0 0 0 3  PHQ- 9 Score 7 0   0 0 9    Fall Risk     10/28/2023    3:02 PM 10/12/2023   11:45 AM 07/16/2023   10:48 AM 06/18/2023   10:49 AM 06/16/2023    2:13 PM  Fall Risk   Falls in the past year? 1 0 1 1 1   Comment fell on knee      Number falls in past yr: 0 0 0 0 1  Injury with Fall? 0 0  1 1 0  Risk for fall due to : Impaired mobility;Impaired balance/gait;Medication side effect No Fall Risks History of fall(s) Other (Comment) History of fall(s)  Risk for fall due to: Comment    patient states she was in a hurry and moving too quickly   Follow up Falls evaluation completed Falls evaluation completed Falls evaluation completed;Education provided;Falls prevention discussed Education provided;Falls evaluation completed;Falls prevention discussed Falls evaluation completed    MEDICARE RISK AT HOME:  Medicare Risk at Home Any stairs in or around the home?: No If so, are there any without handrails?: No Home free of loose throw rugs in walkways, pet beds, electrical cords, etc?: Yes Adequate lighting in your home to reduce risk of falls?: Yes Life alert?: No Use of a cane, walker or w/c?: Yes Grab bars in the bathroom?: No Shower chair or bench in shower?: Yes Elevated toilet seat or a handicapped toilet?: No  TIMED UP AND GO:  Was the test performed?  No  Cognitive Function: 6CIT completed        10/28/2023  3:06 PM 10/22/2022    2:18 PM 10/10/2021    2:48 PM 09/20/2020    2:31 PM 08/18/2019   12:26 PM  6CIT Screen  What Year? 0 points 0 points 0 points 0 points 0 points  What month? 0 points 0 points 0 points 0 points 0 points  What time? 0 points 0 points 3 points 0 points 0 points  Count back from 20 0 points 0 points 0 points 0 points 0 points  Months in reverse 0 points 0 points 0 points 0 points 0 points  Repeat phrase 2 points 0 points 0 points 0 points 0 points  Total Score 2 points 0 points 3 points 0 points 0 points    Immunizations Immunization History  Administered Date(s) Administered   Fluad Quad(high Dose 65+) 11/10/2018, 11/29/2019, 11/22/2020, 12/17/2021   Fluad Trivalent(High Dose 65+) 11/26/2022   INFLUENZA, HIGH DOSE SEASONAL PF 12/21/2017, 11/10/2018   Influenza,inj,quad, With Preservative 02/25/2016   Influenza-Unspecified 10/25/2016,  10/25/2020   Moderna Sars-Covid-2 Vaccination 03/28/2019, 04/25/2019, 01/02/2020, 07/06/2020, 01/02/2022   PNEUMOCOCCAL CONJUGATE-20 08/03/2020   Pneumococcal Polysaccharide-23 11/02/2013   Pneumococcal-Unspecified 11/24/2016   Tdap 07/04/2015   Zoster Recombinant(Shingrix) 11/03/2018, 12/31/2018    Screening Tests Health Maintenance  Topic Date Due   OPHTHALMOLOGY EXAM  03/05/2023   FOOT EXAM  06/23/2023   INFLUENZA VACCINE  09/25/2023   COVID-19 Vaccine (6 - 2025-26 season) 10/26/2023   HEMOGLOBIN A1C  03/03/2024   Diabetic kidney evaluation - Urine ACR  08/31/2024   Diabetic kidney evaluation - eGFR measurement  10/11/2024   Medicare Annual Wellness (AWV)  10/27/2024   DTaP/Tdap/Td (2 - Td or Tdap) 07/03/2025   Pneumococcal Vaccine: 50+ Years  Completed   DEXA SCAN  Completed   Hepatitis C Screening  Completed   Zoster Vaccines- Shingrix  Completed   HPV VACCINES  Aged Out   Meningococcal B Vaccine  Aged Out   Colonoscopy  Discontinued    Health Maintenance  Health Maintenance Due  Topic Date Due   OPHTHALMOLOGY EXAM  03/05/2023   FOOT EXAM  06/23/2023   INFLUENZA VACCINE  09/25/2023   COVID-19 Vaccine (6 - 2025-26 season) 10/26/2023   Health Maintenance Items Addressed: Due for flu and covid vaccine. Requested eye exam [facility].  Additional Screening:  Vision Screening: Recommended annual ophthalmology exams for early detection of glaucoma and other disorders of the eye. Would you like a referral to an eye doctor? No    Dental Screening: Recommended annual dental exams for proper oral hygiene  Community Resource Referral / Chronic Care Management: CRR required this visit?  No   CCM required this visit?  No   Plan:    I have personally reviewed and noted the following in the patient's chart:   Medical and social history Use of alcohol , tobacco or illicit drugs  Current medications and supplements including opioid prescriptions. Patient is not  currently taking opioid prescriptions. Functional ability and status Nutritional status Physical activity Advanced directives List of other physicians Hospitalizations, surgeries, and ER visits in previous 12 months Vitals Screenings to include cognitive, depression, and falls Referrals and appointments  In addition, I have reviewed and discussed with patient certain preventive protocols, quality metrics, and best practice recommendations. A written personalized care plan for preventive services as well as general preventive health recommendations were provided to patient.   Ardella FORBES Dawn, LPN   [id number]   After Visit Summary: (MyChart) Due to this being a telephonic visit, the  after visit summary with patients personalized plan was offered to patient via MyChart   Notes: Nothing significant to report at this time.

## 2023-11-03 ENCOUNTER — Ambulatory Visit
Admission: EM | Admit: 2023-11-03 | Discharge: 2023-11-03 | Disposition: A | Discharge status: Home / Self Care | Source: Ambulatory Visit | Attending: Family Medicine | Admitting: Family Medicine

## 2023-11-03 DIAGNOSIS — Z1152 Encounter for screening for COVID-19: Secondary | ICD-10-CM

## 2023-11-03 DIAGNOSIS — B9789 Other viral agents as the cause of diseases classified elsewhere: Secondary | ICD-10-CM

## 2023-11-03 DIAGNOSIS — J988 Other specified respiratory disorders: Secondary | ICD-10-CM

## 2023-11-03 LAB — POC SOFIA SARS ANTIGEN FIA: SARS Coronavirus 2 Ag: NEGATIVE

## 2023-11-03 MED ORDER — PROMETHAZINE-DM 6.25-15 MG/5ML PO SYRP: 2.5000 mL | ORAL_SOLUTION | Freq: Three times a day (TID) | ORAL | 0 refills | Status: DC | PRN

## 2023-11-03 NOTE — Discharge Instructions (Signed)
 We will manage this as a viral respiratory infection. For sore throat or cough try using a honey-based tea. Use 3 teaspoons of honey with juice squeezed from half lemon. Place shaved pieces of ginger into 1/2-1 cup of water  and warm over stove top. Then mix the ingredients and repeat every 4 hours as needed. Please take Tylenol  500mg -650mg  once every 6 hours for fevers, aches and pains. Hydrate very well with at least 2 liters (64 ounces) of water . Eat light meals such as soups (chicken and noodles, chicken wild rice, vegetable).  Do not eat any foods that you are allergic to.  Start an antihistamine like Zyrtec  (10mg  daily) for postnasal drainage, sinus congestion.  You can take this together with cough syrup as needed.

## 2023-11-03 NOTE — ED Provider Notes (Signed)
 [facility]RG[facility]  Note:  This document was prepared using Conservation officer, historic buildings and may include unintentional dictation errors.  MRN: [id number] DOB: 1945-09-02  Subjective:   Teresa Golden is a 78 y.o. female presenting for 1 day history of malaise and fatigue, runny nose. No fever, throat pain, chest pain, shob, wheezing, coughing. Had exposure to COVID 19.   No current facility-administered medications for this encounter.  Current Outpatient Medications:    allopurinol  (ZYLOPRIM ) 100 MG tablet, Take 1 tablet by mouth once daily, Disp: 270 tablet, Rfl: 0   amLODipine  (NORVASC ) 5 MG tablet, Take 1 tablet by mouth once daily, Disp: 90 tablet, Rfl: 2   augmented betamethasone  dipropionate (DIPROLENE -AF) 0.05 % cream, APPLY CREAM TOPICALLY TO AFFECTED AREA TWICE DAILY, Disp: , Rfl:    AZO-CRANBERRY PO, Take 1 tablet by mouth daily., Disp: , Rfl:    cetirizine  (ZYRTEC ) 10 MG tablet, Take 1 tablet (10 mg total) by mouth daily. (Patient taking differently: Take 10 mg by mouth daily as needed for allergies.), Disp: 30 tablet, Rfl: 1   Cholecalciferol 5000 units TABS, Take 5,000 Units by mouth daily., Disp: , Rfl:    diclofenac  Sodium (VOLTAREN ) 1 % GEL, APPLY 2 GRAMS TO AFFECTED AREA(S) 4 TIMES DAILY AS NEEDED (Patient taking differently: Apply 2-4 g topically every 6 (six) hours as needed (to affected areas).), Disp: 200 g, Rfl: 2   empagliflozin  (JARDIANCE ) 25 MG TABS tablet, Take 1 tablet (25 mg total) by mouth daily before breakfast., Disp: , Rfl:    gabapentin  (NEURONTIN ) 300 MG capsule, Take 2 capsules (600 mg total) by mouth at bedtime., Disp: 180 capsule, Rfl: 0   glucose blood (ONETOUCH VERIO) test strip, USE TO CHECK BLOOD SUGAR   TWO TIMES A DAY AS         INSTRUCTED, Disp: 200 strip, Rfl: 3   Insulin  Degludec (TRESIBA ) 100 UNIT/ML SOLN, Inject 10 Units/oz/day into the skin at bedtime. Patient is taking at bedtime, Disp: , Rfl:    Lancets (ONETOUCH DELICA  PLUS LANCET33G) MISC, USE TO CHECK BLOOD SUGARS  TWO TIMES A DAY AS         INSTRUCTED, Disp: 200 each, Rfl: 3   levothyroxine  (SYNTHROID ) 25 MCG tablet, Take 1/2 tablet daily., Disp: 90 tablet, Rfl: 1   losartan  (COZAAR ) 100 MG tablet, TAKE 1 TABLET DAILY, Disp: 90 tablet, Rfl: 2   metoprolol  succinate (TOPROL -XL) 25 MG 24 hr tablet, TAKE 1 TABLET AT BEDTIME   (DISCONTINUE COREG ), Disp: 90 tablet, Rfl: 1   Multiple Vitamins-Minerals (MULTIVITAMIN WITH MINERALS) tablet, Take 1 tablet by mouth daily. Women's 50+, Disp: , Rfl:    pantoprazole  (PROTONIX ) 40 MG tablet, Take 40 mg by mouth daily as needed., Disp: , Rfl:    Semaglutide , 1 MG/DOSE, (OZEMPIC , 1 MG/DOSE,) 4 MG/3ML SOPN, Inject 1 mg into the skin once a week., Disp: 9 mL, Rfl: 1   simvastatin  (ZOCOR ) 10 MG tablet, Take 1 tablet by mouth once daily, Disp: 90 tablet, Rfl: 2   triamcinolone ointment (KENALOG) 0.1 %, Apply 1 Application topically 2 (two) times daily., Disp: , Rfl:    Allergies  Allergen Reactions   Meloxicam Other (See Comments)    Fever; muscle aches; flu-like symptoms Allergic, per MAR   Grass Pollen(K-O-R-T-Swt Vern) Other (See Comments)    Sinus inflammation    Other Other (See Comments)    Rose fever and hay fever    Past Medical History:  Diagnosis Date  Arthritis    Chronic kidney disease    self reports ckd stage 3    Depression    Diabetes mellitus, type II, insulin  dependent (HCC)    With neurologic complications. 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    Hypertensive nephropathy 07/05/2018   Hypothyroidism    Hypothyroidism 02/06/2018   Morbid obesity with BMI of 50.0-59.9, adult (HCC)    Scoliosis      Past Surgical History:  Procedure Laterality  Date   ABDOMINAL HYSTERECTOMY     uterus   CHOLECYSTECTOMY     DIAGNOSTIC LAPAROSCOPY     DILATION AND CURETTAGE OF UTERUS     EYE SURGERY     cataract extraction bilateral    INCISION AND DRAINAGE OF WOUND N/A 10/15/2020   Procedure: IRRIGATION AND DEBRIDEMENT WOUND;  Surgeon: Lowery Estefana RAMAN, DO;  Location: [facility] OR;  Service: Plastics;  Laterality: N/A;  60 min   JOINT REPLACEMENT     Left knee   KNEE ARTHROPLASTY Right 07/30/2017   Procedure: RIGHT TOTAL KNEE ARTHROPLASTY WITH COMPUTER NAVIGATION;  Surgeon: Fidel Rogue, MD;  Location: [facility] ORS;  Service: Orthopedics;  Laterality: Right;  Needs RNFA   LAPAROSCOPIC GASTRIC BANDING     and reversal   LAPAROSCOPIC GASTRIC SLEEVE RESECTION N/A 08/26/2016   Procedure: LAPAROSCOPIC GASTRIC SLEEVE RESECTION WITH UPPER ENOD;  Surgeon: Gladis Cough, MD;  Location: [facility] ORS;  Service: General;  Laterality: N/A;   PANNICULECTOMY N/A 09/26/2020   Procedure: PANNICULECTOMY;  Surgeon: Lowery Estefana RAMAN, DO;  Location: [facility] OR;  Service: Plastics;  Laterality: N/A;   TONSILLECTOMY     TRANSTHORACIC ECHOCARDIOGRAM  11/2013   EF 65-70%. Normal diastolic Fxn.  Normal Valves.   UMBILICAL HERNIA REPAIR N/A 09/26/2020   Procedure: HERNIA REPAIR UMBILICAL ADULT;  Surgeon: Sebastian Moles, MD;  Location: [facility] OR;  Service: General;  Laterality: N/A;    Family History  Problem Relation Age of Onset   Heart attack Mother    Heart failure Mother    Stroke Father    Breast cancer Neg Hx    BRCA 1/2 Neg Hx     Social History   Tobacco Use   Smoking status: Former    Current packs/day: 0.25    Average packs/day: 0.3 packs/day for 10.0 years (2.5 ttl pk-yrs)    Types: Cigarettes   Smokeless tobacco: Never   Tobacco comments:    quit 20 years ago  Vaping Use   Vaping status: Never Used  Substance Use Topics   Alcohol  use: Not Currently    Alcohol /week: 1.0 standard drink of alcohol     Types: 1 Glasses of wine per week    Comment: occasional    Drug use: No    ROS   Objective:   Vitals: BP 125/78 (BP Location: Right Arm)   Pulse 83   Temp 99 F (37.2 C) (Oral)   Resp 18   SpO2 96%   Physical Exam Constitutional:      General: She is not in acute distress.    Appearance: Normal appearance. She is well-developed and normal weight. She is not ill-appearing, toxic-appearing or diaphoretic.  HENT:     Head: Normocephalic and atraumatic.  Right Ear: Tympanic membrane, ear canal and external ear normal. No drainage or tenderness. No middle ear effusion. There is no impacted cerumen. Tympanic membrane is not erythematous or bulging.     Left Ear: Tympanic membrane, ear canal and external ear normal. No drainage or tenderness.  No middle ear effusion. There is no impacted cerumen. Tympanic membrane is not erythematous or bulging.     Nose: Nose normal. No congestion or rhinorrhea.     Mouth/Throat:     Mouth: Mucous membranes are moist. No oral lesions.     Pharynx: No pharyngeal swelling, oropharyngeal exudate, posterior oropharyngeal erythema or uvula swelling.     Tonsils: No tonsillar exudate or tonsillar abscesses.  Eyes:     General: No scleral icterus.       Right eye: No discharge.        Left eye: No discharge.     Extraocular Movements: Extraocular movements intact.     Right eye: Normal extraocular motion.     Left eye: Normal extraocular motion.     Conjunctiva/sclera: Conjunctivae normal.  Cardiovascular:     Rate and Rhythm: Normal rate and regular rhythm.     Heart sounds: Normal heart sounds. No murmur heard.    No friction rub. No gallop.  Pulmonary:     Effort: Pulmonary effort is normal. No respiratory distress.     Breath sounds: No stridor. No wheezing, rhonchi or rales.  Chest:     Chest wall: No tenderness.  Musculoskeletal:     Cervical back: Normal range of motion and neck supple.  Lymphadenopathy:     Cervical: No cervical adenopathy.  Skin:    General: Skin is warm and dry.   Neurological:     General: No focal deficit present.     Mental Status: She is alert and oriented to person, place, and time.  Psychiatric:        Mood and Affect: Mood normal.        Behavior: Behavior normal.     Results for orders placed or performed during the hospital encounter of 11/03/23 (from the past 24 hours)  POC SARS Coronavirus 2 Ag     Status: None   Collection Time: 11/03/23  5:17 PM  Result Value Ref Range   SARS Coronavirus 2 Ag Negative Negative   *Note: Due to a large number of results and/or encounters for the requested time period, some results have not been displayed. A complete set of results can be found in Results Review.    Assessment and Plan :   PDMP not reviewed this encounter.  1. Viral respiratory infection   2. Encounter for screening for COVID-19    Deferred imaging given clear cardiopulmonary exam, hemodynamically stable vital signs. Suspect viral URI, viral syndrome. Physical exam findings reassuring and vital signs stable for discharge. Advised supportive care, offered symptomatic relief. Counseled patient on potential for adverse effects with medications prescribed/recommended today, ER and return-to-clinic precautions discussed, patient verbalized understanding.     Christopher Savannah, PA-C 11/03/23 1726

## 2023-11-03 NOTE — ED Triage Notes (Signed)
 Pt reports runny nose x 1 day. Reports she was exposed to a COVID positive person 2 days ago.

## 2023-11-04 ENCOUNTER — Ambulatory Visit (INDEPENDENT_AMBULATORY_CARE_PROVIDER_SITE_OTHER): Admitting: Psychiatry

## 2023-11-04 DIAGNOSIS — Z599 Problem related to housing and economic circumstances, unspecified: Secondary | ICD-10-CM | POA: Diagnosis not present

## 2023-11-04 DIAGNOSIS — Z630 Problems in relationship with spouse or partner: Secondary | ICD-10-CM

## 2023-11-04 DIAGNOSIS — R69 Illness, unspecified: Secondary | ICD-10-CM | POA: Diagnosis not present

## 2023-11-04 DIAGNOSIS — F3341 Major depressive disorder, recurrent, in partial remission: Secondary | ICD-10-CM | POA: Diagnosis not present

## 2023-11-04 NOTE — Progress Notes (Signed)
 Psychotherapy Progress Note [facility] Jodie Kendall, PhD LP  [name] [facility])    MRN: [id number] Therapy format: Individual psychotherapy Date: 11/04/2023      Start: 9:10a     Stop: 10:00a     Time Spent: 50 min Location: In-person   Session narrative (presenting needs, interim history, self-report of stressors and symptoms, applications of prior therapy, status changes, and interventions made in session) Had an urgent care visit yesterday for suspicion of COVID (Bible study cohort reported +) -- tested negative, but the process threw around her transportation and shopping plans.  Re home management, reduced her phone service costs and worked through the refrigerator impasse with landlady.  Getting through that involved claims from landlady's handyman that she keeps too much food in the fridge for it to run well (?) and that landlady could not find one in black finish (Pt herself saw 6 or 7 at [employer]).  Finally delivered recently, working, and they split cost acceptably.  Still looking for 1st floor apt (or elevator service), explains at length being only interested in renting, not the responsibilities and risks of ownership.  Motivated a little further by running into some cash flow problem lately.  Considering a spa weekend for her 79th birthday in Feb.  Relates a surprisingly long nap after a big breakfast, figured it was coming down from stress dealing with refrigerator and apartment hunting.    Affirmed assertiveness.  Discussed interests in concerts and events, set against necessary expenses, seems to have a read on her actual money.  Practical advice for consignment stores, should she want to convert  excess clothing to cash.  [employer], most likely.    Exie is back on her mind, with his birthday next week.  Explains again that she woke up to him not being sufficiently interested in her, or respectful, and moved on.  Not clear whether they  still speak regularly.  On the upside socially, has befriended [place] estranged wife [place].  He's been unreliable, actually, so hanging out more with [place], on something like a big-sister or foster GM basis.  Plans now for several get-togethers.  Affirmed developing a new friendship, putting her in touch with Al-Anon, being a benevolent person in her life, and finding a new source of self-worth, especially vs. the degradation she's felt from her depressive, promiscuous past.  Noticed on EHR that complex case management had trouble reaching her and noted closing her case.  States she still has a 9/23 appt with the nurse, which shows.  Asks whether antidepressant would be recommended for her.  Told it could a bit complicated with her medical conditions, but it's conceivable.  Seems optional as long as she can set herself to the tasks mentioned.  Therapeutic modalities: Cognitive Behavioral Therapy, Solution-Oriented/Positive Psychology, and Ego-Supportive  Mental Status/Observations:  Appearance:   Casual     Behavior:  Appropriate  Motor:  Rollator  Speech/Language:   Clear and Coherent  Affect:  Appropriate  Mood:  dysthymic  Thought process:  normal, somewhat circumstantial  Thought content:    WNL  Sensory/Perceptual disturbances:    WNL  Orientation:  Fully oriented  Attention:  Good    Concentration:  Good  Memory:  WNL  Insight:    Good  Judgment:   Variable  Impulse Control:  Good   Risk Assessment: Danger to Self: No Self-injurious Behavior: No Danger to Others: No Physical Aggression / Violence: No Duty to Warn: No  Access to Firearms a concern: No  Assessment of progress:  progressing  Diagnosis:   ICD-10-CM   1. Major depressive disorder, recurrent, in partial remission (HCC)  F33.41     2. Multiple comorbid health conditions  R69     3. Housing or economic circumstances  Z59.9     4. Relationship problem between partners  Z63.0      Plan:  Socialization  and relationships -- Endorse abstinence from dysfunctional relationships with men who use for sex, even if enjoyed, as they turn up empty.  Endorse limits with dysfunctional relatives as well, just encourage a standard of speaking limits, not just enacting them silently.  Endorse boundary/limits with Alfonso.  Endorse adoptive family relationships where present and strong friendships as enlarged family.  Continue involvement in church and suitable groups as able.  Develop local friendships and bonus mother-daughter relationship at interest.  Offer to support groups such as Co[employer] or Al-Anon, and active elder facilities like [employer], [employer].  Endorse option to work part-time if truly able and interested.  Endorse choice over dating, so long as she can keep her integrity, maintain the view that she is worth working for and being honest for, and does not have to make any desperate decisions to feel loved and lovable.  Lonely does not have to be desperate.   Self-esteem -- Per her faith, continue to practice self-esteem grounded in God's unconditional love.  Self-affirm that whatever she might deem stupid, she has either learned, improved, or was doing the best she could figure out at the time, while trying to deal with strong and natural feelings, e.g., loneliness.  When tempted to feel like a failure for lack of family, or for acting quickly on desires, reframe self-image as a survivor who had to take on the harshest of losses and is legitimately tempted.  Acknowledge regrets when they come up, and reaffirm having learned from them.  Options to inventory wrongs a la 4th and 5th steps of AA and to write letters, or write and burn confessions, as motivated, but stay true that taking care of herself now is itself righting wrongs, retiring regrets, and enacting healthy self-esteem. General health -- Endorse any healthy activity, to include pool/gym if able and interested.  Address diabetes and  renal self-care promptly and reliably.  Recommend taming light in late evening and after bed, with options to use soothing sound and orange lenses. Living situation -- Endorse researching more reliable, better suited living space, and as needed.  As needed, research tenant's rights in the event landlady defaults on the mortgage.  Most likely no longer in the market for home ownership.  For housekeeping, try to make a practice of smaller, more frequent efforts and/or bringing in help as needed, with the mindset that it's all a gift to the lady who lives here.   Other recommendations/advice -- As may be noted above.  Continue to utilize previously learned skills ad lib. Medication compliance -- Maintain medication as prescribed and work faithfully with relevant prescriber(s) if any changes are desired or seem indicated. Crisis service -- Aware of call list and work-in appts.  Call the clinic on-call service, 988/hotline, 911, or present to [facility] or ER if any life-threatening psychiatric crisis. Followup -- Return for time as already scheduled.  Next scheduled visit with me 11/16/2023.  Next scheduled in this office 11/16/2023.  Lamar Kendall, PhD Jodie Kendall, PhD LP Clinical Psychologist, [facility] [phone]  [address], [address] [place], [place] [address] [phone]

## 2023-11-12 ENCOUNTER — Telehealth: Payer: Self-pay

## 2023-11-12 ENCOUNTER — Ambulatory Visit
Admission: EM | Admit: 2023-11-12 | Discharge: 2023-11-12 | Disposition: A | Discharge status: Home / Self Care | Attending: Family Medicine | Admitting: Family Medicine

## 2023-11-12 DIAGNOSIS — U071 COVID-19: Secondary | ICD-10-CM | POA: Diagnosis not present

## 2023-11-12 LAB — POC SARS CORONAVIRUS 2 AG -  ED: SARS Coronavirus 2 Ag: POSITIVE — AB

## 2023-11-12 MED ORDER — NIRMATRELVIR&RITONAVIR 150/100 10 X 150 MG & 10 X 100MG PO TBPK: ORAL_TABLET | ORAL | 0 refills | Status: DC

## 2023-11-12 MED ORDER — NIRMATRELVIR&RITONAVIR 150/100 10 X 150 MG & 10 X 100MG PO TBPK
ORAL_TABLET | ORAL | 0 refills | Status: DC
Start: 2023-11-12 — End: 2023-11-12

## 2023-11-12 NOTE — ED Provider Notes (Signed)
 [facility] - URGENT CARE CENTER  Note:  This document was prepared using Conservation officer, historic buildings and may include unintentional dictation errors.  MRN: [id number] DOB: 05/26/1945  Subjective:   Teresa Golden is a 78 y.o. female presenting for 2-day history of watery eyes, chills, productive cough.  No chest pain, shortness of breath or wheezing.  Was seen 1 week ago and had a negative COVID test.  Would like to have COVID treatment if she test positive.  Last GFR was 41 mL/min on 10/12/2023.  No current facility-administered medications for this encounter.  Current Outpatient Medications:    allopurinol  (ZYLOPRIM ) 100 MG tablet, Take 1 tablet by mouth once daily, Disp: 270 tablet, Rfl: 0   amLODipine  (NORVASC ) 5 MG tablet, Take 1 tablet by mouth once daily, Disp: 90 tablet, Rfl: 2   augmented betamethasone  dipropionate (DIPROLENE -AF) 0.05 % cream, APPLY CREAM TOPICALLY TO AFFECTED AREA TWICE DAILY, Disp: , Rfl:    AZO-CRANBERRY PO, Take 1 tablet by mouth daily., Disp: , Rfl:    cetirizine  (ZYRTEC ) 10 MG tablet, Take 1 tablet (10 mg total) by mouth daily. (Patient taking differently: Take 10 mg by mouth daily as needed for allergies.), Disp: 30 tablet, Rfl: 1   Cholecalciferol 5000 units TABS, Take 5,000 Units by mouth daily., Disp: , Rfl:    diclofenac  Sodium (VOLTAREN ) 1 % GEL, APPLY 2 GRAMS TO AFFECTED AREA(S) 4 TIMES DAILY AS NEEDED (Patient taking differently: Apply 2-4 g topically every 6 (six) hours as needed (to affected areas).), Disp: 200 g, Rfl: 2   empagliflozin  (JARDIANCE ) 25 MG TABS tablet, Take 1 tablet (25 mg total) by mouth daily before breakfast., Disp: , Rfl:    gabapentin  (NEURONTIN ) 300 MG capsule, Take 2 capsules (600 mg total) by mouth at bedtime., Disp: 180 capsule, Rfl: 0   glucose blood (ONETOUCH VERIO) test strip, USE TO CHECK BLOOD SUGAR   TWO TIMES A DAY AS         INSTRUCTED, Disp: 200 strip, Rfl: 3   Insulin  Degludec (TRESIBA ) 100 UNIT/ML SOLN,  Inject 10 Units/oz/day into the skin at bedtime. Patient is taking at bedtime, Disp: , Rfl:    Lancets (ONETOUCH DELICA PLUS LANCET33G) MISC, USE TO CHECK BLOOD SUGARS  TWO TIMES A DAY AS         INSTRUCTED, Disp: 200 each, Rfl: 3   levothyroxine  (SYNTHROID ) 25 MCG tablet, Take 1/2 tablet daily., Disp: 90 tablet, Rfl: 1   losartan  (COZAAR ) 100 MG tablet, TAKE 1 TABLET DAILY, Disp: 90 tablet, Rfl: 2   metoprolol  succinate (TOPROL -XL) 25 MG 24 hr tablet, TAKE 1 TABLET AT BEDTIME   (DISCONTINUE COREG ), Disp: 90 tablet, Rfl: 1   Multiple Vitamins-Minerals (MULTIVITAMIN WITH MINERALS) tablet, Take 1 tablet by mouth daily. Women's 50+, Disp: , Rfl:    pantoprazole  (PROTONIX ) 40 MG tablet, Take 40 mg by mouth daily as needed., Disp: , Rfl:    promethazine -dextromethorphan (PROMETHAZINE -DM) 6.25-15 MG/5ML syrup, Take 2.5 mLs by mouth 3 (three) times daily as needed for cough., Disp: 100 mL, Rfl: 0   Semaglutide , 1 MG/DOSE, (OZEMPIC , 1 MG/DOSE,) 4 MG/3ML SOPN, Inject 1 mg into the skin once a week., Disp: 9 mL, Rfl: 1   simvastatin  (ZOCOR ) 10 MG tablet, Take 1 tablet by mouth once daily, Disp: 90 tablet, Rfl: 2   triamcinolone ointment (KENALOG) 0.1 %, Apply 1 Application topically 2 (two) times daily., Disp: , Rfl:    Allergies  Allergen Reactions   Meloxicam Other (See Comments)  Fever; muscle aches; flu-like symptoms Allergic, per MAR   Grass Pollen(K-O-R-T-Swt Vern) Other (See Comments)    Sinus inflammation    Other Other (See Comments)    Rose fever and hay fever    Past Medical History:  Diagnosis Date   Arthritis    Chronic kidney disease    self reports ckd stage 3    Depression    Diabetes mellitus, type II, insulin  dependent (HCC)    With neurologic complications. Bilateral lower extremity peripheral neuropathy   Dyspnea    with excertion; no issues now since weight loss surgery    Endometriosis    Essential hypertension    GERD (gastroesophageal reflux disease)     Hepatitis C    C dormant; states she is in remission since taking Harvoni    Hypertensive nephropathy 07/05/2018   Hypothyroidism    Hypothyroidism 02/06/2018   Morbid obesity with BMI of 50.0-59.9, adult (HCC)    Scoliosis      Past Surgical History:  Procedure Laterality Date   ABDOMINAL HYSTERECTOMY     uterus   CHOLECYSTECTOMY     DIAGNOSTIC LAPAROSCOPY     DILATION AND CURETTAGE OF UTERUS     EYE SURGERY     cataract extraction bilateral    INCISION AND DRAINAGE OF WOUND N/A 10/15/2020   Procedure: IRRIGATION AND DEBRIDEMENT WOUND;  Surgeon: Lowery Estefana RAMAN, DO;  Location: [facility] OR;  Service: Plastics;  Laterality: N/A;  60 min   JOINT REPLACEMENT     Left knee   KNEE ARTHROPLASTY Right 07/30/2017   Procedure: RIGHT TOTAL KNEE ARTHROPLASTY WITH COMPUTER NAVIGATION;  Surgeon: Fidel Rogue, MD;  Location: [facility] ORS;  Service: Orthopedics;  Laterality: Right;  Needs RNFA   LAPAROSCOPIC GASTRIC BANDING     and reversal   LAPAROSCOPIC GASTRIC SLEEVE RESECTION N/A 08/26/2016   Procedure: LAPAROSCOPIC GASTRIC SLEEVE RESECTION WITH UPPER ENOD;  Surgeon: Gladis Cough, MD;  Location: [facility] ORS;  Service: General;  Laterality: N/A;   PANNICULECTOMY N/A 09/26/2020   Procedure: PANNICULECTOMY;  Surgeon: Lowery Estefana RAMAN, DO;  Location: [facility] OR;  Service: Plastics;  Laterality: N/A;   TONSILLECTOMY     TRANSTHORACIC ECHOCARDIOGRAM  11/2013   EF 65-70%. Normal diastolic Fxn.  Normal Valves.   UMBILICAL HERNIA REPAIR N/A 09/26/2020   Procedure: HERNIA REPAIR UMBILICAL ADULT;  Surgeon: Sebastian Moles, MD;  Location: [facility] OR;  Service: General;  Laterality: N/A;    Family History  Problem Relation Age of Onset   Heart attack Mother    Heart failure Mother    Stroke Father    Breast cancer Neg Hx    BRCA 1/2 Neg Hx     Social History   Tobacco Use   Smoking status: Former    Current packs/day: 0.25    Average packs/day: 0.3 packs/day for 10.0 years (2.5 ttl pk-yrs)    Types:  Cigarettes   Smokeless tobacco: Never   Tobacco comments:    quit 20 years ago  Vaping Use   Vaping status: Never Used  Substance Use Topics   Alcohol  use: Yes    Comment: occasional   Drug use: No    ROS   Objective:   Vitals: BP 138/70 (BP Location: Left Arm)   Pulse 70   Temp 98.1 F (36.7 C) (Oral)   Resp 16   SpO2 94%   Physical Exam Constitutional:      General: She is not in acute distress.    Appearance: Normal appearance.  She is well-developed. She is not ill-appearing, toxic-appearing or diaphoretic.  HENT:     Head: Normocephalic and atraumatic.     Right Ear: External ear normal.     Left Ear: External ear normal.     Nose: Nose normal.     Mouth/Throat:     Mouth: Mucous membranes are moist.  Eyes:     General: Lids are normal. Lids are everted, no foreign bodies appreciated. Vision grossly intact. No scleral icterus.       Right eye: No foreign body, discharge or hordeolum.        Left eye: No foreign body, discharge or hordeolum.     Extraocular Movements: Extraocular movements intact.     Right eye: Normal extraocular motion.     Left eye: Normal extraocular motion and no nystagmus.     Conjunctiva/sclera: Conjunctivae normal.     Right eye: Right conjunctiva is not injected. No chemosis, exudate or hemorrhage.    Left eye: Left conjunctiva is not injected. No chemosis, exudate or hemorrhage.    Pupils: Pupils are equal, round, and reactive to light.  Cardiovascular:     Rate and Rhythm: Normal rate and regular rhythm.     Heart sounds: Normal heart sounds. No murmur heard.    No friction rub. No gallop.  Pulmonary:     Effort: Pulmonary effort is normal. No respiratory distress.     Breath sounds: No stridor. No wheezing, rhonchi or rales.  Chest:     Chest wall: No tenderness.  Skin:    General: Skin is warm and dry.  Neurological:     General: No focal deficit present.     Mental Status: She is alert and oriented to person, place, and  time.  Psychiatric:        Mood and Affect: Mood normal.        Behavior: Behavior normal.     Results for orders placed or performed during the hospital encounter of 11/12/23 (from the past 24 hours)  POC SARS Coronavirus 2 Ag-ED - Nasal Swab     Status: Abnormal   Collection Time: 11/12/23 12:00 PM  Result Value Ref Range   SARS Coronavirus 2 Ag Positive (A) Negative   *Note: Due to a large number of results and/or encounters for the requested time period, some results have not been displayed. A complete set of results can be found in Results Review.    Assessment and Plan :   PDMP not reviewed this encounter.  1. COVID-19 virus infection    Hold simvastatin .  Sent a prescription for Paxlovid  at renal dosing.  Use supportive care otherwise.  Deferred imaging given clear cardiopulmonary exam, hemodynamically stable vital signs.  Counseled patient on potential for adverse effects with medications prescribed/recommended today, ER and return-to-clinic precautions discussed, patient verbalized understanding.    Christopher Savannah, [place] 11/12/23 1240

## 2023-11-12 NOTE — Discharge Instructions (Signed)
 I have sent a prescription for Paxlovid  to your pharmacy. This is a COVID anti-viral. DO NOT TAKE SIMVASTATIN  FOR 1 week. For sore throat or cough try using a honey-based tea. Use 3 teaspoons of honey with juice squeezed from half lemon. Place shaved pieces of ginger into 1/2-1 cup of water  and warm over stove top. Then mix the ingredients and repeat every 4 hours as needed. Please take Tylenol  500mg -650mg  once every 6 hours for fevers, aches and pains. Hydrate very well with at least 2 liters (64 ounces) of water . Eat light meals such as soups (chicken and noodles, chicken wild rice, vegetable).  Do not eat any foods that you are allergic to.  Start an antihistamine like Zyrtec  (10mg  daily) for postnasal drainage, sinus congestion.  You can take this together with cough syrup as needed.
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
## 2023-11-12 NOTE — Telephone Encounter (Signed)
 Pt called, Paxlovid  sent to [facility] [address], [place] - [address] (Ph: [phone])

## 2023-11-12 NOTE — ED Triage Notes (Signed)
 Pt c/o prod cough, chills and watery eyes x 2 days-states she was seen here ~1 week ago and had neg covid after known covid exposure-last dose nyquil ~8am-NAD-to tx room with rollater

## 2023-11-12 NOTE — Telephone Encounter (Signed)
 Called, not answered. Paxlovid  sent to [facility] [place] [address] [place] - [address] (Ph: [phone])

## 2023-11-16 ENCOUNTER — Ambulatory Visit (INDEPENDENT_AMBULATORY_CARE_PROVIDER_SITE_OTHER): Admitting: Psychiatry

## 2023-11-16 DIAGNOSIS — R69 Illness, unspecified: Secondary | ICD-10-CM | POA: Diagnosis not present

## 2023-11-16 DIAGNOSIS — F3341 Major depressive disorder, recurrent, in partial remission: Secondary | ICD-10-CM

## 2023-11-16 DIAGNOSIS — Z599 Problem related to housing and economic circumstances, unspecified: Secondary | ICD-10-CM | POA: Diagnosis not present

## 2023-11-16 NOTE — Progress Notes (Signed)
 Psychotherapy Progress Note [facility] Jodie Kendall, PhD LP  [name] [facility])    MRN: [id number] Therapy format: Individual psychotherapy Date: 11/16/2023      Start: 9:11a     Stop: 10:09a     Time Spent: 58 min Location: In-person   Session narrative (presenting needs, interim history, self-report of stressors and symptoms, applications of prior therapy, status changes, and interventions made in session) Been through a COVID infection recently.  Exposed at church, initially tested negative 3 days out, able to treat it with Tylenol , ginger tea, and rest.  Notes thickened bronchial mucus coming up now, but lungs clear, vitals good, no concerns.  Enjoyed a televised [employer] concert while confined.    Now thinking about relocating to a 3BR and getting a roommate.  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  Allowed to explain in detail.  Discussed what the prospect of rooming together might be, including Diane's (small) tendency to make snap decisions, compatibility of lifestyle, and the natural safety and help resisting unwanted advances from [name] it would afford.  Endorsed the idea in general and encouraged working through timing and logistics.  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  Pleasantly surprised to be treated well as the only Black, even invited to apply for judgeship.  Affirmed and encouraged in meaningful activity, especially that which continues to dispel concerns about racism [place].  Therapeutic modalities: Cognitive Behavioral Therapy, Solution-Oriented/Positive Psychology, and Ego-Supportive  Mental Status/Observations:  Appearance:   Casual     Behavior:  Appropriate   Motor:  Rollator  Speech/Language:   Clear and Coherent  Affect:  Appropriate  Mood:  normal  Thought process:  circumstantial  Thought content:    WNL  Sensory/Perceptual disturbances:    WNL  Orientation:  Fully oriented  Attention:  Good    Concentration:  Fair  Memory:  WNL  Insight:    Good  Judgment:   Good  Impulse Control:  Variable   Risk Assessment: Danger to Self: No Self-injurious Behavior: No Danger to Others: No Physical Aggression / Violence: No Duty to Warn: No Access to Firearms a concern: No  Assessment of progress:  progressing  Diagnosis:   ICD-10-CM   1. Major depressive disorder, recurrent, in partial remission (HCC)  F33.41     2. Housing or economic circumstances  Z59.9     3. Multiple comorbid health conditions  R69      Plan:  Socialization and relationships -- Endorse abstinence from dysfunctional relationships with men who use for sex, even if enjoyed, as they turn up empty.  Endorse limits with dysfunctional relatives as well, just encourage a standard of speaking limits, not just enacting them silently.  Endorse boundary/limits with Alfonso.  Endorse adoptive family relationships where present and strong friendships as enlarged family.  Continue involvement in church and suitable groups as able.  Develop local friendships and bonus mother-daughter relationship at interest.  Offer to support groups such as CoDA or Al-Anon, and active elder facilities like [employer], [employer].  Endorse option to work part-time if truly able and interested.  Endorse choice over dating, so long as she can keep her integrity, maintain the view that she  is worth working for and being honest for, and does not have to make any desperate decisions to feel loved and lovable.  Lonely does not have to mean desperate.  Endorse prospect of acquiring a roommate. Self-esteem -- Per her faith, continue to practice self-esteem grounded in God's unconditional love.   Self-affirm that whatever she might deem stupid, she has either learned, improved, or was doing the best she could figure out at the time, while trying to deal with strong and natural feelings, e.g., loneliness.  When tempted to feel like a failure for lack of family, or for acting quickly on desires, reframe self-image as a survivor who had to take on the harshest of losses and is legitimately tempted.  Acknowledge regrets when they come up, and reaffirm having learned from them.  Options to inventory wrongs a la 4th and 5th steps of AA and to write letters, or write and burn confessions, as motivated, but stay true that taking care of herself now is itself righting wrongs, retiring regrets, and enacting healthy self-esteem. General health -- Endorse any healthy activity, to include pool/gym if able and interested.  Address diabetes and renal self-care promptly and reliably.  Recommend taming light in late evening and after bed, with options to use soothing sound and orange lenses. Living situation -- Endorse researching more reliable, better suited living space, and as needed.  As needed, research tenant's rights in the event landlady defaults on the mortgage.  Most likely no longer in the market for home ownership.  For housekeeping, try to make a practice of smaller, more frequent efforts and/or bringing in help as needed, with the mindset that it's all a gift to the lady who lives here.   Other recommendations/advice -- As may be noted above.  Continue to utilize previously learned skills ad lib. Medication compliance -- Maintain medication as prescribed and work faithfully with relevant prescriber(s) if any changes are desired or seem indicated. Crisis service -- Aware of call list and work-in appts.  Call the clinic on-call service, 988/hotline, 911, or present to [facility] or ER if any life-threatening psychiatric crisis. Followup -- Return for time as already scheduled.  Next scheduled visit with me  12/02/2023.  Next scheduled in this office 12/02/2023.  Lamar Kendall, PhD Jodie Kendall, PhD LP Clinical Psychologist, [facility] [address], [address] [place], [place] [address] [phone]

## 2023-11-17 ENCOUNTER — Other Ambulatory Visit: Payer: Self-pay

## 2023-11-17 NOTE — Patient Instructions (Signed)
 Visit Information  Thank you for taking time to visit with me today. Please don't hesitate to contact me if I can be of assistance to you before our next scheduled appointment.  Your next care management appointment is by telephone on Wednesday, October 15 at 11:30 AM  Please call the care guide team at [phone] if you need to cancel, schedule, or reschedule an appointment.   Please call 1-800-273-TALK (toll free, 24 hour hotline) if you are experiencing a Mental Health or Behavioral Health Crisis or need someone to talk to.  Clayborne Ly RN BSN CCM [place]  [facility], [facility] Nurse Care Coordinator  Direct Dial: [phone] Website: [email or web address]

## 2023-11-17 NOTE — Patient Outreach (Signed)
 Complex Care Management   Visit Note  11/17/2023  Name:  Teresa Golden MRN: [id number] DOB: 06/18/1945  Situation: Referral received for Complex Care Management related to Diabetes with Complications and Hypertension, Polyneuropathy, CKD, COVID 19. I obtained verbal consent from Patient.  Visit completed with Patient on the phone.  Background:   Past Medical History:  Diagnosis Date   Arthritis    Chronic kidney disease    self reports ckd stage 3    Depression    Diabetes mellitus, type II, insulin  dependent (HCC)    With neurologic complications. 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    Hypertensive nephropathy 07/05/2018   Hypothyroidism    Hypothyroidism 02/06/2018   Morbid obesity with BMI of 50.0-59.9, adult (HCC)    Scoliosis     Assessment: Patient Reported Symptoms:  Cognitive Cognitive Status: Alert and oriented to person, place, and time, Normal speech and language skills Cognitive/Intellectual Conditions Management [RPT]: None reported or documented in medical history or problem list   Health Maintenance Behaviors: Annual physical exam, Healthy diet, Immunizations, Social activities, Spiritual practice(s) Health Facilitated by: Healthy diet, Rest  Neurological Neurological Review of Symptoms: No symptoms reported    HEENT HEENT Symptoms Reported: No symptoms reported      Cardiovascular Cardiovascular Symptoms Reported: No symptoms reported Does patient have uncontrolled Hypertension?: No Cardiovascular Management Strategies: Medication therapy, Routine screening Cardiovascular Self-Management Outcome: 4 (good)  Respiratory Respiratory Symptoms Reported: Productive cough Other Respiratory Symptoms: patient continues to  have mild symptoms secondary to a recent COVID infection Respiratory Management Strategies: Routine screening, Medication therapy, Fluid modification Respiratory Self-Management Outcome: 4 (good)  Endocrine Endocrine Symptoms Reported: No symptoms reported Is patient diabetic?: Yes Is patient checking blood sugars at home?: Yes List most recent blood sugar readings, include date and time of day: highest reading is 132 Endocrine Self-Management Outcome: 4 (good)  Gastrointestinal Gastrointestinal Symptoms Reported: No symptoms reported      Genitourinary Genitourinary Symptoms Reported: Frequency, Incontinence Genitourinary Management Strategies: Fluid modification Genitourinary Self-Management Outcome: 4 (good)  Integumentary Integumentary Symptoms Reported: No symptoms reported    Musculoskeletal Musculoskelatal Symptoms Reviewed: Limited mobility, Unsteady gait Musculoskeletal Management Strategies: Medical device, Routine screening Musculoskeletal Self-Management Outcome: 4 (good)      Psychosocial Psychosocial Symptoms Reported: No symptoms reported   Major Change/Loss/Stressor/Fears (CP): Medical condition, self Techniques to Cope with Loss/Stress/Change:  (rest and hydration) Quality of Family Relationships: supportive    11/17/2023    PHQ2-9 Depression Screening   Chistina Roston interest or pleasure in doing things    Feeling down, depressed, or hopeless    PHQ-2 - Total Score    Trouble falling or staying asleep, or sleeping too much    Feeling tired or having Jastin Fore energy    Poor appetite or overeating     Feeling bad about yourself - or that you are a failure or have let yourself or your family down    Trouble concentrating on things, such as reading the newspaper or watching television    Moving or speaking so slowly that other people could have noticed.  Or the opposite - being so fidgety or restless that you have been moving around a lot more than usual  Thoughts that  you would be better off dead, or hurting yourself in some way    PHQ2-9 Total Score    If you checked off any problems, how difficult have these problems made it for you to do your work, take care of things at home, or get along with other people    Depression Interventions/Treatment      There were no vitals filed for this visit.  Medications Reviewed Today     Reviewed by Morgan Clayborne CROME, RN (Registered Nurse) on 11/17/23 at 1036  Med List Status: <None>   Medication Order Taking? Sig Documenting Provider Last Dose Status Informant  allopurinol  (ZYLOPRIM ) 100 MG tablet [id number]  Take 1 tablet by mouth once daily Petrina Pries, NP  Active   amLODipine  (NORVASC ) 5 MG tablet [id number]  Take 1 tablet by mouth once daily Jarold Medici, MD  Active   augmented betamethasone  dipropionate (DIPROLENE -AF) 0.05 % cream [id number]  APPLY CREAM TOPICALLY TO AFFECTED AREA TWICE DAILY [provider]  Active   AZO-CRANBERRY PO [id number]  Take 1 tablet by mouth daily. [provider]  Active Self  cetirizine  (ZYRTEC ) 10 MG tablet [id number]  Take 1 tablet (10 mg total) by mouth daily.  Patient taking differently: Take 10 mg by mouth daily as needed for allergies.   Petrina Pries, NP  Active   Cholecalciferol 5000 units TABS [id number]  Take 5,000 Units by mouth daily. [provider]  Active Nursing Home Medication Administration Guide (MAG)           Med Note ALLEGRA, [place] I   Fri Aug 07, 2017  6:51 PM)    diclofenac  Sodium (VOLTAREN ) 1 % GEL [id number]  APPLY 2 GRAMS TO AFFECTED AREA(S) 4 TIMES DAILY AS NEEDED  Patient taking differently: Apply 2-4 g topically every 6 (six) hours as needed (to affected areas).   Jarold Medici, MD  Active   empagliflozin  (JARDIANCE ) 25 MG TABS tablet [id number]  Take 1 tablet (25 mg total) by mouth daily before breakfast. Jarold Medici, MD  Active   gabapentin  (NEURONTIN ) 300 MG capsule [id number]  Take 2 capsules (600 mg total) by mouth  at bedtime. Tobie Franky SQUIBB, DPM  Active   glucose blood [facility] VERIO) test strip [id number]  USE TO CHECK BLOOD SUGAR   TWO TIMES A DAY AS         INSTRUCTED Jarold Medici, MD  Active   Insulin  Degludec (TRESIBA ) 100 UNIT/ML SOLN [id number]  Inject 10 Units/oz/day into the skin at bedtime. Patient is taking at bedtime [provider]  Active   Lancets [facility] [place]) MISC [id number]  USE TO CHECK BLOOD SUGARS  TWO TIMES A DAY AS         INSTRUCTED Jarold Medici, MD  Active   levothyroxine  (SYNTHROID ) 25 MCG tablet [id number]  Take 1/2 tablet daily. Jarold Medici, MD  Active   losartan  (COZAAR ) 100 MG tablet [id number]  TAKE 1 TABLET DAILY Jarold Medici, MD  Active   metoprolol  succinate (TOPROL -XL) 25 MG 24 hr tablet [id number]  TAKE 1 TABLET AT BEDTIME   (DISCONTINUE COREG ) Jarold Medici, MD  Active   Multiple Vitamins-Minerals (MULTIVITAMIN WITH MINERALS) tablet [id number]  Take 1 tablet by mouth daily. Women's 50+ [provider]  Active Nursing Home Medication Administration Guide (MAG)           Med Note ALLEGRA, [place] I   Fri Aug 07, 2017  6:53 PM)    nirmatrelvir /ritonavir , renal dosing, (PAXLOVID ) 10  x 150 MG & 10 x 100MG  TBPK [id number]  Take nirmatrelvir  (150mg ) one tablet twice daily for 5 days and ritonavir  (100 mg) one tablet twice daily for 5 days. Christopher Savannah, PA-C  Active   pantoprazole  (PROTONIX ) 40 MG tablet [id number]  Take 40 mg by mouth daily as needed. [provider]  Active   promethazine -dextromethorphan (PROMETHAZINE -DM) 6.25-15 MG/5ML syrup [id number]  Take 2.5 mLs by mouth 3 (three) times daily as needed for cough. Christopher Savannah, PA-C  Active   Semaglutide , 1 MG/DOSE, (OZEMPIC , 1 MG/DOSE,) 4 MG/3ML SOPN [id number]  Inject 1 mg into the skin once a week. Jarold Medici, MD  Active   Patient not taking:  Discontinued 11/06/20 1600 (Reorder) simvastatin  (ZOCOR ) 10 MG tablet [id number]  Take 1 tablet by mouth once daily Jarold Medici,  MD  Active   triamcinolone ointment (KENALOG) 0.1 % [id number]  Apply 1 Application topically 2 (two) times daily. [provider]  Active   Med List Note Marisa Nathanel SAILOR, CPhT 11/02/20 2136): Admitted to [facility] on 10/22/2020 Larue SAILOR) [place], [place]            Recommendation:   PCP Follow-up with Dr. Jarold on 01/12/24 at 10:40 AM   Follow Up Plan:   Telephone follow up appointment date/time:  Wednesday, October 15 at 11:30 AM  Clayborne Ly RN BSN CCM [place]  [facility], [facility] Health Nurse Care Coordinator  Direct Dial: [phone] Website: [email or web address]

## 2023-11-24 ENCOUNTER — Telehealth: Payer: Self-pay

## 2023-11-24 NOTE — Patient Outreach (Signed)
 Inbound call from patient asking for help with scheduling a [facility] visit for renal labs ordered by [facility]. Educated patient on how to access the [facility] website...[email or web address] Instructed patient on how to set up an account with [facility] in order to schedule her visits, review her labs and or pay her bill. Patient verbalizes understanding on how to access this website and move forward as instructed. Encouraged patient to contact this RN if further assistance is needed and she verbalizes understanding. Reviewed and discussed our next scheduled nurse follow up call date/time: on 12/09/23 at 11:30 AM.   Clayborne Ly RN BSN CCM [place]  [facility], [facility] Health Nurse Care Coordinator  Direct Dial: [phone] Website: [email or web address]

## 2023-11-29 ENCOUNTER — Other Ambulatory Visit: Payer: Self-pay

## 2023-11-29 ENCOUNTER — Ambulatory Visit
Admission: EM | Admit: 2023-11-29 | Discharge: 2023-11-29 | Disposition: A | Discharge status: Home / Self Care | Attending: Family Medicine | Admitting: Family Medicine

## 2023-11-29 DIAGNOSIS — R300 Dysuria: Secondary | ICD-10-CM | POA: Diagnosis present

## 2023-11-29 DIAGNOSIS — N3001 Acute cystitis with hematuria: Secondary | ICD-10-CM

## 2023-11-29 LAB — POCT URINE DIPSTICK
Bilirubin, UA: NEGATIVE
Glucose, UA: NEGATIVE mg/dL
Ketones, POC UA: NEGATIVE mg/dL
Nitrite, UA: NEGATIVE
Protein Ur, POC: NEGATIVE mg/dL
Spec Grav, UA: 1.015 (ref 1.010–1.025)
Urobilinogen, UA: 0.2 U/dL
pH, UA: 7 (ref 5.0–8.0)

## 2023-11-29 MED ORDER — CEPHALEXIN 500 MG PO CAPS: 500.0000 mg | ORAL_CAPSULE | Freq: Three times a day (TID) | ORAL | 0 refills | Status: AC

## 2023-11-29 NOTE — ED Triage Notes (Signed)
 Pt c/o chills, RLQ, LLQ abdominal pain, and urinary frequency started yesterday

## 2023-11-29 NOTE — Discharge Instructions (Addendum)
 The clinic will contact you with results of the urine culture done today if positive.  Please start cephalexin  3 times a day for 7 days.  Lots of rest and fluids.  Please follow-up with your PCP if your symptoms do not improve.  Please go to the ER for any worsening symptoms.  I hope you feel better soon!

## 2023-11-29 NOTE — ED Provider Notes (Signed)
 [facility]    CSN: [id number] Arrival date & time: 11/29/23  0900      History   Chief Complaint No chief complaint on file.   HPI Teresa Golden is a 78 y.o. female presents for dysuria.  Patient reports yesterday she developed urinary burning, urgency, frequency, and lower abdominal discomfort.  Denies fevers, hematuria, nausea/vomiting, flank pain.  No vaginal discharge.  Does have a history of UTIs.  She has been taking cranberry juice and increasing water  intake for her symptoms.  No other concerns at this time.  HPI  Past Medical History:  Diagnosis Date   Arthritis    Chronic kidney disease    self reports ckd stage 3    Depression    Diabetes mellitus, type II, insulin  dependent (HCC)    With neurologic complications. 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    Hypertensive nephropathy 07/05/2018   Hypothyroidism    Hypothyroidism 02/06/2018   Morbid obesity with BMI of 50.0-59.9, adult [facility])    Scoliosis     Patient Active Problem List   Diagnosis Date Noted   Liver mass 10/17/2023   Encounter for general adult medical examination w/o abnormal findings 09/01/2023   Bunion of left foot 06/16/2023   Urinary tract infection without hematuria 06/16/2023   MGUS (monoclonal gammopathy of unknown significance) 06/16/2023   Contusion of right knee 06/16/2023   Fall on same level from slipping, tripping, or stumbling 06/16/2023   Tension-type headache, not intractable 04/08/2023   Pain due to onychomycosis of toenails of both feet 03/20/2023   Hallux valgus, acquired 03/20/2023   GERD with apnea 01/01/2023   Gasping for breath 01/01/2023   Snoring 12/02/2022   Abnormal laboratory test 12/02/2022   Hair loss  10/18/2022   Left ear impacted cerumen 06/30/2022   Estrogen deficiency 06/30/2022   Diarrhea 05/14/2022   Glomerular disorders in diseases classified elsewhere 05/13/2022   Inflammatory and toxic neuropathy 05/13/2022   Polyneuropathy due to type 2 diabetes mellitus (HCC) 05/13/2022   Arthritis of hand 04/07/2022   Arthritis of right wrist 04/07/2022   Carpal tunnel syndrome of right wrist 03/05/2022   Acute idiopathic gout of right ankle 10/20/2021   Primary insomnia 10/20/2021   Class 1 obesity due to excess calories with serious comorbidity and body mass index (BMI) of 34.0 to 34.9 in adult 10/20/2021   Infected wound 11/02/2020   Pressure injury of skin 10/22/2020   Wound infection after surgery 10/12/2020   Normocytic anemia 10/12/2020   Obesity (BMI 30-39.9) 10/12/2020   Sepsis (HCC) 10/12/2020   Personal history of COVID-19 06/21/2020   Hypertension 06/18/2020   Type 2 diabetes mellitus with stage 3 chronic kidney disease, with long-term current use of insulin  (HCC) 11/29/2019   Trochanteric bursitis of right hip 11/18/2019   Panniculitis 09/30/2019   Back pain 09/30/2019   Muscle weakness 03/17/2019   Cirrhosis of liver without ascites (HCC) 07/05/2018   Hypertensive nephropathy 07/05/2018   Stage 3a chronic kidney disease (HCC) 07/05/2018   Chronic left shoulder pain 07/05/2018   Primary hypothyroidism 02/06/2018   Hepatitis C virus infection cured after antiviral drug therapy 02/04/2018   Osteoarthritis of right knee 07/30/2017   Bilateral knee  pain 04/29/2017   Cervical radiculopathy 04/28/2017   Degeneration of lumbar intervertebral disc 03/13/2017   Degenerative scoliosis 03/13/2017   Degenerative spondylolisthesis 03/13/2017   Flatback syndrome 03/13/2017   S/P laparoscopic sleeve gastrectomy July 2018 08/26/2016   Preop cardiovascular exam 07/30/2016   Dyspnea on exertion 07/30/2016   Bilateral lower extremity edema 07/30/2016   Edema of lower extremity  07/30/2016    Past Surgical History:  Procedure Laterality Date   ABDOMINAL HYSTERECTOMY     uterus   CHOLECYSTECTOMY     DIAGNOSTIC LAPAROSCOPY     DILATION AND CURETTAGE OF UTERUS     EYE SURGERY     cataract extraction bilateral    INCISION AND DRAINAGE OF WOUND N/A 10/15/2020   Procedure: IRRIGATION AND DEBRIDEMENT WOUND;  Surgeon: Lowery Estefana RAMAN, DO;  Location: [facility] OR;  Service: Plastics;  Laterality: N/A;  60 min   JOINT REPLACEMENT     Left knee   KNEE ARTHROPLASTY Right 07/30/2017   Procedure: RIGHT TOTAL KNEE ARTHROPLASTY WITH COMPUTER NAVIGATION;  Surgeon: Fidel Rogue, MD;  Location: [facility] ORS;  Service: Orthopedics;  Laterality: Right;  Needs RNFA   LAPAROSCOPIC GASTRIC BANDING     and reversal   LAPAROSCOPIC GASTRIC SLEEVE RESECTION N/A 08/26/2016   Procedure: LAPAROSCOPIC GASTRIC SLEEVE RESECTION WITH UPPER ENOD;  Surgeon: Gladis Cough, MD;  Location: [facility] ORS;  Service: General;  Laterality: N/A;   PANNICULECTOMY N/A 09/26/2020   Procedure: PANNICULECTOMY;  Surgeon: Lowery Estefana RAMAN, DO;  Location: [facility] OR;  Service: Plastics;  Laterality: N/A;   TONSILLECTOMY     TRANSTHORACIC ECHOCARDIOGRAM  11/2013   EF 65-70%. Normal diastolic Fxn.  Normal Valves.   UMBILICAL HERNIA REPAIR N/A 09/26/2020   Procedure: HERNIA REPAIR UMBILICAL ADULT;  Surgeon: Sebastian Moles, MD;  Location: [facility];  Service: General;  Laterality: N/A;    OB History   No obstetric history on file.      Home Medications    Prior to Admission medications   Medication Sig Start Date End Date Taking? Authorizing Provider  cephALEXin  (KEFLEX ) 500 MG capsule Take 1 capsule (500 mg total) by mouth 3 (three) times daily for 7 days. 11/29/23 12/06/23 Yes Alivia Cimino, Jodi R, NP  allopurinol  (ZYLOPRIM ) 100 MG tablet Take 1 tablet by mouth once daily 07/29/23   Petrina Pries, NP  amLODipine  (NORVASC ) 5 MG tablet Take 1 tablet by mouth once daily 07/21/23   Jarold Medici, MD  augmented betamethasone   dipropionate (DIPROLENE -AF) 0.05 % cream APPLY CREAM TOPICALLY TO AFFECTED AREA TWICE DAILY    [provider]  AZO-CRANBERRY PO Take 1 tablet by mouth daily.    [provider]  cetirizine  (ZYRTEC ) 10 MG tablet Take 1 tablet (10 mg total) by mouth daily. Patient taking differently: Take 10 mg by mouth daily as needed for allergies. 10/29/22   Petrina Pries, NP  Cholecalciferol 5000 units TABS Take 5,000 Units by mouth daily.    [provider]  diclofenac  Sodium (VOLTAREN ) 1 % GEL APPLY 2 GRAMS TO AFFECTED AREA(S) 4 TIMES DAILY AS NEEDED Patient taking differently: Apply 2-4 g topically every 6 (six) hours as needed (to affected areas). 02/28/19   Jarold Medici, MD  empagliflozin  (JARDIANCE ) 25 MG TABS tablet Take 1 tablet (25 mg total) by mouth daily before breakfast. 10/12/23   Jarold Medici, MD  gabapentin  (NEURONTIN ) 300 MG capsule Take 2 capsules (600 mg total) by mouth at bedtime. 10/23/23   Tobie Franky SQUIBB, DPM  glucose blood (ONETOUCH VERIO) test  strip USE TO CHECK BLOOD SUGAR   TWO TIMES A DAY AS         INSTRUCTED 09/07/23   Jarold Medici, MD  Insulin  Degludec (TRESIBA ) 100 UNIT/ML SOLN Inject 10 Units/oz/day into the skin at bedtime. Patient is taking at bedtime    [provider]  Lancets [facility] DELICA PLUS LANCET33G) MISC USE TO CHECK BLOOD SUGARS  TWO TIMES A DAY AS         INSTRUCTED 01/24/21   Jarold Medici, MD  levothyroxine  (SYNTHROID ) 25 MCG tablet Take 1/2 tablet daily. 03/20/22   Jarold Medici, MD  losartan  (COZAAR ) 100 MG tablet TAKE 1 TABLET DAILY 04/28/23   Jarold Medici, MD  metoprolol  succinate (TOPROL -XL) 25 MG 24 hr tablet TAKE 1 TABLET AT BEDTIME   (DISCONTINUE COREG ) 05/28/23   Jarold Medici, MD  Multiple Vitamins-Minerals (MULTIVITAMIN WITH MINERALS) tablet Take 1 tablet by mouth daily. Women's 50+    [provider]  nirmatrelvir /ritonavir , renal dosing, (PAXLOVID ) 10 x 150 MG & 10 x 100MG  TBPK Take nirmatrelvir  (150mg ) one  tablet twice daily for 5 days and ritonavir  (100 mg) one tablet twice daily for 5 days. 11/12/23   Christopher Savannah, PA-C  pantoprazole  (PROTONIX ) 40 MG tablet Take 40 mg by mouth daily as needed. 09/15/21   [provider]  promethazine -dextromethorphan (PROMETHAZINE -DM) 6.25-15 MG/5ML syrup Take 2.5 mLs by mouth 3 (three) times daily as needed for cough. 11/03/23   Christopher Savannah, PA-C  Semaglutide , 1 MG/DOSE, (OZEMPIC , 1 MG/DOSE,) 4 MG/3ML SOPN Inject 1 mg into the skin once a week. 04/24/21   Jarold Medici, MD  simvastatin  (ZOCOR ) 10 MG tablet Take 1 tablet by mouth once daily 05/08/23   Jarold Medici, MD  triamcinolone ointment (KENALOG) 0.1 % Apply 1 Application topically 2 (two) times daily. 11/25/22   [provider]  SEMGLEE , YFGN, 100 UNIT/ML Pen Inject 9 Units into the skin at bedtime. Patient not taking: Reported on 05/28/2023  11/06/20  [provider]    Family History Family History  Problem Relation Age of Onset   Heart attack Mother    Heart failure Mother    Stroke Father    Breast cancer Neg Hx    BRCA 1/2 Neg Hx     Social History Social History   Tobacco Use   Smoking status: Former    Current packs/day: 0.25    Average packs/day: 0.3 packs/day for 10.0 years (2.5 ttl pk-yrs)    Types: Cigarettes   Smokeless tobacco: Never   Tobacco comments:    quit 20 years ago  Vaping Use   Vaping status: Never Used  Substance Use Topics   Alcohol  use: Yes    Comment: occasional   Drug use: No     Allergies   Meloxicam, Grass pollen(k-o-r-t-swt vern), and Other   Review of Systems Review of Systems  Genitourinary:  Positive for dysuria.     Physical Exam Triage Vital Signs ED Triage Vitals  Encounter Vitals Group     BP 11/29/23 0916 (!) 162/68     Girls Systolic BP Percentile --      Girls Diastolic BP Percentile --      Boys Systolic BP Percentile --      Boys Diastolic BP Percentile --      Pulse Rate 11/29/23 0916 76     Resp 11/29/23  0916 18     Temp 11/29/23 0916 97.7 F (36.5 C)     Temp Source 11/29/23 0916 Oral  SpO2 11/29/23 0916 96 %     Weight --      Height --      Head Circumference --      Peak Flow --      Pain Score 11/29/23 0912 5     Pain Loc --      Pain Education --      Exclude from Growth Chart --    No data found.  Updated Vital Signs BP (!) 162/68   Pulse 76   Temp 97.7 F (36.5 C) (Oral)   Resp 18   SpO2 96%   Visual Acuity Right Eye Distance:   Left Eye Distance:   Bilateral Distance:    Right Eye Near:   Left Eye Near:    Bilateral Near:     Physical Exam Vitals and nursing note reviewed.  Constitutional:      Appearance: Normal appearance.  HENT:     Head: Normocephalic and atraumatic.  Eyes:     Pupils: Pupils are equal, round, and reactive to light.  Cardiovascular:     Rate and Rhythm: Normal rate.  Pulmonary:     Effort: Pulmonary effort is normal.  Abdominal:     Tenderness: There is no right CVA tenderness or left CVA tenderness.  Skin:    General: Skin is warm and dry.  Neurological:     General: No focal deficit present.     Mental Status: She is alert and oriented to person, place, and time.  Psychiatric:        Mood and Affect: Mood normal.        Behavior: Behavior normal.      UC Treatments / Results  Labs (all labs ordered are listed, but only abnormal results are displayed) Labs Reviewed  POCT URINE DIPSTICK - Abnormal; Notable for the following components:      Result Value   Color, UA light yellow (*)    Clarity, UA hazy (*)    Blood, UA trace-intact (*)    Leukocytes, UA Trace (*)    All other components within normal limits  URINE CULTURE    Contains abnormal data BMP8+EGFR Order: [id number]  Status: Final result     Next appt: 12/08/2023 at 08:30 AM in [facility])     Dx: Type 2 diabetes mellitus with stage 3...   Test Result Released: Yes (seen)     Messages: Seen   1 Result Note     1 Patient  Communication     View Follow-Up Encounter     1 HM Topic          Component Ref Range & Units (hover) 1 mo ago (10/12/23) 2 mo ago (09/01/23) 6 mo ago (05/28/23) 6 mo ago (05/11/23) 7 mo ago (04/08/23) 10 mo ago (01/13/23) 1 yr ago (11/26/22)  Glucose 128 High  126 High  148 High  CM  103 High  74 CM 95  BUN 19 23 18  R  21 19 R 20  Creatinine, Ser 1.32 High  1.17 High  1.13 High  R  1.41 High  1.05 High  R 1.25 High   eGFR 41 Low  47 Low   46.0 R, CM 38 Low   44 Low   BUN/Creatinine Ratio 14 20   15  16   Sodium 140 142 139 R  143 138 R 142  Potassium 4.4 4.2 4.0 R  4.5 4.3 R 4.3  Chloride 105 104 105 R  106 106 R  105  CO2 20 22 26  R  24 23 R 23  Calcium 9.5 9.7 9.5 R  9.3 8.8 Low  R 9.1  Resulting Agency [facility]  [facility]         Narrative Performed by: HOYT Performed at:  [address] [facility] [address] [place], [place]  [id number] Lab Director: Frankey Sas MD, Phone:  [phone]  Specimen Collected: 10/12/23 12:20 Last Resulted: 10/13/23 00:35    EKG   Radiology No results found.  Procedures Procedures (including critical care time)  Medications Ordered in UC Medications - No data to display  Initial Impression / Assessment and Plan / UC Course  I have reviewed the triage vital signs and the nursing notes.  Pertinent labs & imaging results that were available during my care of the patient were reviewed by me and considered in my medical decision making (see chart for details).     Reviewed exam and symptoms with patient.  No red flags.  Urine positive for UTI will send urine culture and start cephalexin .  Patient reports she needs 3 times a day to be effective.  Labs reviewed, creatinine clearance 53 mL/min.  Will do cephalexin   3 times daily for 7 days.  Encouraged fluids rest and PCP follow-up if symptoms do not improve.  ER precautions reviewed. Final Clinical Impressions(s) / UC Diagnoses   Final diagnoses:   Dysuria  Acute cystitis with hematuria     Discharge Instructions      The clinic will contact you with results of the urine culture done today if positive.  Please start cephalexin  3 times a day for 7 days.  Lots of rest and fluids.  Please follow-up with your PCP if your symptoms do not improve.  Please go to the ER for any worsening symptoms.  I hope you feel better soon!     ED Prescriptions     Medication Sig Dispense Auth. Provider   cephALEXin  (KEFLEX ) 500 MG capsule Take 1 capsule (500 mg total) by mouth 3 (three) times daily for 7 days. 21 capsule Adea Geisel, Jodi R, NP      PDMP not reviewed this encounter.   Loreda Myla SAUNDERS, NP 11/29/23 [phone]

## 2023-11-30 ENCOUNTER — Ambulatory Visit: Admitting: Nurse Practitioner

## 2023-11-30 ENCOUNTER — Other Ambulatory Visit

## 2023-11-30 ENCOUNTER — Inpatient Hospital Stay

## 2023-12-01 ENCOUNTER — Ambulatory Visit (HOSPITAL_COMMUNITY): Payer: Self-pay

## 2023-12-01 ENCOUNTER — Ambulatory Visit: Admitting: Psychiatry

## 2023-12-01 LAB — URINE CULTURE: Culture: >=100000 — AB

## 2023-12-02 ENCOUNTER — Ambulatory Visit: Admitting: Psychiatry

## 2023-12-02 ENCOUNTER — Telehealth: Payer: Self-pay

## 2023-12-02 DIAGNOSIS — R69 Illness, unspecified: Secondary | ICD-10-CM | POA: Diagnosis not present

## 2023-12-02 DIAGNOSIS — F3341 Major depressive disorder, recurrent, in partial remission: Secondary | ICD-10-CM

## 2023-12-02 DIAGNOSIS — Z599 Problem related to housing and economic circumstances, unspecified: Secondary | ICD-10-CM

## 2023-12-02 DIAGNOSIS — Z630 Problems in relationship with spouse or partner: Secondary | ICD-10-CM

## 2023-12-02 NOTE — Progress Notes (Signed)
 Psychotherapy Progress Note [facility] 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  Further recovered from COVID.  Having repeat UTIs attributed to Jardiance .    Continues quandary about living situation, not as sure about moving in with Diane, and concerned whether rent elsewhere would be too high to make it worthwhile.  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  For now, has months to decide about relocating, figures $200 a month increase she cold handle (next September).  As for finances, had to pawn a necklace, recently recovered.  Decided to start doing her own nails to save money.  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  Affirmed and encouraged.  Says she has been learning to forgive herself, talking with God more, especially around how she did as a mother to [place], and giving her away to her inlaws.  Never told her about her own mother, a sociopath.  Recalls teens when father dressed her in jeans and coveralls, in contrast to the dresses girls typically wore.  Probed readiness and interest in hearing (hypothetically) from [facility] and her father, both, about how she's done and any quandaries he has about whether she did OK as a daughter and a mother.  Offered that we can come back to it if she wants to spend time hearing from God or  these loved ones, and if it would help her further settle self-doubt and self-blame.  Still interacting with Exie, still maintaining no sex past 7 months.  Feeling the self-respect of it, no real indication of a quandary or temptation.  Affirmed and encouraged.  Hopeful of cleaning up procrastinated housekeeping.  Affirmed and encouraged in taking a small commitments approach.  Therapeutic modalities: Cognitive Behavioral Therapy, Solution-Oriented/Positive Psychology, Environmental manager, and Faith-sensitive  Mental Status/Observations:  Appearance:   Casual     Behavior:  Appropriate  Motor:  Normal exc Rollator  Speech/Language:   Clear and Coherent  Affect:  Appropriate  Mood:  Varied by topic  Thought process:  normal  Thought content:    WNL  Sensory/Perceptual disturbances:    WNL  Orientation:  Fully oriented  Attention:  Good    Concentration:  Fair  Memory:  WNL  Insight:    Good  Judgment:   Good  Impulse Control:  Variable   Risk Assessment: Danger to Self: No Self-injurious Behavior: No Danger to Others: No Physical Aggression / Violence: No Duty to Warn: No Access to Firearms a concern: No  Assessment of progress:  progressing  Diagnosis:   ICD-10-CM   1. Major depressive disorder, recurrent, in partial remission  F33.41     2. Housing or economic circumstances  Z59.9     3. Multiple comorbid health conditions  R69     4. Relationship problem between partners  Z63.0  Plan:  Socialization and relationships -- Endorse abstinence from dysfunctional relationships with men who use for sex, even if enjoyed, as they turn up empty.  Endorse limits with dysfunctional relatives as well, just encourage a standard of speaking limits, not just enacting them silently.  Endorse boundary/limits with Alfonso.  Endorse adoptive family relationships where present and strong friendships as enlarged family.  Continue involvement in church and suitable groups as able.  Develop  local friendships and bonus mother-daughter relationship at interest.  Offer to support groups such as CoDA or Al-Anon, and active elder facilities like [employer], [employer].  Endorse option to work part-time if truly able and interested.  Endorse choice over dating, so long as she can keep her integrity, maintain the view that she is worth working for and being honest for, and does not have to make any desperate decisions to feel loved and lovable.  Lonely does not have to mean desperate.  Endorse prospect of acquiring a roommate. Self-esteem -- Per her faith, continue to practice self-esteem grounded in God's unconditional love.  Self-affirm that whatever she might deem stupid, she has either learned, improved, or was doing the best she could figure out at the time, while trying to deal with strong and natural feelings, e.g., loneliness.  When tempted to feel like a failure for lack of family, or for acting quickly on desires, reframe self-image as a survivor who had to take on the harshest of losses and is legitimately tempted.  Acknowledge regrets when they come up, and reaffirm having learned from them.  Options to inventory wrongs a la 4th and 5th steps of AA and to write letters, or write and burn confessions, as motivated, but stay true that taking care of herself now is itself righting wrongs, retiring regrets, and enacting healthy self-esteem. General health -- Endorse any healthy activity, to include pool/gym if able and interested.  Address diabetes and renal self-care promptly and reliably.  Recommend taming light in late evening and after bed, with options to use soothing sound and orange lenses. Living situation -- Endorse researching more reliable, better suited living space, and as needed.  As needed, research tenant's rights in the event landlady defaults on the mortgage.  Most likely no longer in the market for home ownership.  For housekeeping, try to make a practice of  smaller, more frequent efforts and/or bringing in help as needed, with the mindset that it's all a gift to the lady who lives here.   Other recommendations/advice -- As may be noted above.  Continue to utilize previously learned skills ad lib. Medication compliance -- Maintain medication as prescribed and work faithfully with relevant prescriber(s) if any changes are desired or seem indicated. Crisis service -- Aware of call list and work-in appts.  Call the clinic on-call service, 988/hotline, 911, or present to [facility] or ER if any life-threatening psychiatric crisis. Followup -- No follow-ups on file.  Next scheduled visit with me 12/16/2023.  Next scheduled in this office 12/16/2023.  Lamar Kendall, PhD Jodie Kendall, PhD LP Clinical Psychologist, [facility] [address], [address] [place], [place] [address] [phone]

## 2023-12-02 NOTE — Telephone Encounter (Signed)
 PAP: Patient assistance application for Jardiance  through [employer]) has been mailed to pt's home address on file. Provider portion of application will be faxed to provider's office. For renewal 2026

## 2023-12-08 ENCOUNTER — Ambulatory Visit (HOSPITAL_BASED_OUTPATIENT_CLINIC_OR_DEPARTMENT_OTHER)
Admission: RE | Admit: 2023-12-08 | Discharge: 2023-12-08 | Disposition: A | Discharge status: Home / Self Care | Source: Ambulatory Visit | Attending: Nurse Practitioner | Admitting: Nurse Practitioner

## 2023-12-08 ENCOUNTER — Inpatient Hospital Stay: Attending: Nurse Practitioner

## 2023-12-08 ENCOUNTER — Encounter: Payer: Self-pay | Admitting: Nurse Practitioner

## 2023-12-08 ENCOUNTER — Inpatient Hospital Stay (HOSPITAL_BASED_OUTPATIENT_CLINIC_OR_DEPARTMENT_OTHER): Admitting: Nurse Practitioner

## 2023-12-08 VITALS — BP 159/88 | HR 71 | Temp 98.2°F | Resp 18 | Ht 65.0 in | Wt 213.0 lb

## 2023-12-08 DIAGNOSIS — I129 Hypertensive chronic kidney disease with stage 1 through stage 4 chronic kidney disease, or unspecified chronic kidney disease: Secondary | ICD-10-CM | POA: Diagnosis not present

## 2023-12-08 DIAGNOSIS — Z86718 Personal history of other venous thrombosis and embolism: Secondary | ICD-10-CM | POA: Insufficient documentation

## 2023-12-08 DIAGNOSIS — Z8616 Personal history of COVID-19: Secondary | ICD-10-CM | POA: Diagnosis not present

## 2023-12-08 DIAGNOSIS — E1122 Type 2 diabetes mellitus with diabetic chronic kidney disease: Secondary | ICD-10-CM | POA: Insufficient documentation

## 2023-12-08 DIAGNOSIS — D472 Monoclonal gammopathy: Secondary | ICD-10-CM | POA: Insufficient documentation

## 2023-12-08 DIAGNOSIS — N189 Chronic kidney disease, unspecified: Secondary | ICD-10-CM | POA: Insufficient documentation

## 2023-12-08 DIAGNOSIS — R053 Chronic cough: Secondary | ICD-10-CM | POA: Insufficient documentation

## 2023-12-08 DIAGNOSIS — E039 Hypothyroidism, unspecified: Secondary | ICD-10-CM | POA: Diagnosis not present

## 2023-12-08 DIAGNOSIS — Z79899 Other long term (current) drug therapy: Secondary | ICD-10-CM | POA: Insufficient documentation

## 2023-12-08 DIAGNOSIS — R059 Cough, unspecified: Secondary | ICD-10-CM | POA: Diagnosis not present

## 2023-12-08 DIAGNOSIS — Z8619 Personal history of other infectious and parasitic diseases: Secondary | ICD-10-CM | POA: Insufficient documentation

## 2023-12-08 DIAGNOSIS — Z87440 Personal history of urinary (tract) infections: Secondary | ICD-10-CM | POA: Insufficient documentation

## 2023-12-08 DIAGNOSIS — M810 Age-related osteoporosis without current pathological fracture: Secondary | ICD-10-CM | POA: Diagnosis not present

## 2023-12-08 LAB — CBC WITH DIFFERENTIAL (CANCER CENTER ONLY)
Abs Immature Granulocytes: 0.02 K/uL (ref 0.00–0.07)
Basophils Absolute: 0 K/uL (ref 0.0–0.1)
Basophils Relative: 0 %
Eosinophils Absolute: 0.3 K/uL (ref 0.0–0.5)
Eosinophils Relative: 6 %
HCT: 37.9 % (ref 36.0–46.0)
Hemoglobin: 12.8 g/dL (ref 12.0–15.0)
Immature Granulocytes: 0 %
Lymphocytes Relative: 32 %
Lymphs Abs: 1.8 K/uL (ref 0.7–4.0)
MCH: 28.3 pg (ref 26.0–34.0)
MCHC: 33.8 g/dL (ref 30.0–36.0)
MCV: 83.7 fL (ref 80.0–100.0)
Monocytes Absolute: 0.7 K/uL (ref 0.1–1.0)
Monocytes Relative: 12 %
Neutro Abs: 2.8 K/uL (ref 1.7–7.7)
Neutrophils Relative %: 50 %
Platelet Count: 154 K/uL (ref 150–400)
RBC: 4.53 MIL/uL (ref 3.87–5.11)
RDW: 15.3 % (ref 11.5–15.5)
WBC Count: 5.6 K/uL (ref 4.0–10.5)
nRBC: 0 % (ref 0.0–0.2)

## 2023-12-08 LAB — CMP (CANCER CENTER ONLY)
ALT: 19 U/L (ref 0–44)
AST: 34 U/L (ref 15–41)
Albumin: 4.3 g/dL (ref 3.5–5.0)
Alkaline Phosphatase: 96 U/L (ref 38–126)
Anion gap: 11 (ref 5–15)
BUN: 17 mg/dL (ref 8–23)
CO2: 24 mmol/L (ref 22–32)
Calcium: 9.7 mg/dL (ref 8.9–10.3)
Chloride: 107 mmol/L (ref 98–111)
Creatinine: 1.01 mg/dL — ABNORMAL HIGH (ref 0.44–1.00)
GFR, Estimated: 56 mL/min — ABNORMAL LOW (ref >60)
Glucose, Bld: 95 mg/dL (ref 70–99)
Potassium: 4.2 mmol/L (ref 3.5–5.1)
Sodium: 142 mmol/L (ref 135–145)
Total Bilirubin: 0.4 mg/dL (ref 0.0–1.2)
Total Protein: 8 g/dL (ref 6.5–8.1)

## 2023-12-08 LAB — LACTATE DEHYDROGENASE: LDH: 290 U/L — ABNORMAL HIGH (ref 98–192)

## 2023-12-08 NOTE — Addendum Note (Signed)
 Addended by: DEBBY OLAM POUR on: 12/08/2023 09:48 AM   Modules accepted: Orders

## 2023-12-08 NOTE — Progress Notes (Signed)
  [facility] OFFICE PROGRESS NOTE   Diagnosis: MGUS  INTERVAL HISTORY:   Teresa Golden returns as scheduled.  She was diagnosed with COVID 11/12/2023.  This was followed by a urinary tract infection earlier this month.  She feels washed out.  She has a persistent cough.  No fever or sweats.  She does not feel short of breath.  Appetite decreased when she was diagnosed with a urinary tract infection, now better.  She denies pain.  Objective:  Vital signs in last 24 hours:  Blood pressure (!) 159/88, pulse 71, temperature 98.2 F (36.8 C), temperature source Temporal, resp. rate 18, height 5' 5 (1.651 m), weight 213 lb (96.6 kg), SpO2 96%.   Well appearing.  HEENT: No thrush or ulcers. Lymphatics: No palpable cervical, supraclavicular, axillary or inguinal lymph nodes. Resp: Expiratory wheeze left upper lung field.  Faint rales left lung base.  No respiratory distress. Cardio: Regular rate and rhythm. GI: No hepatosplenomegaly. Vascular: No leg edema.   Lab Results:  Lab Results  Component Value Date   WBC 5.6 12/08/2023   HGB 12.8 12/08/2023   HCT 37.9 12/08/2023   MCV 83.7 12/08/2023   PLT 154 12/08/2023   NEUTROABS 2.8 12/08/2023    Imaging:  No results found.  Medications: I have reviewed the patient's current medications.  Assessment/Plan: Monoclonal gammopathy of unknown significance 05/28/2023 serum M spike 0.3; serum IFE with IgG monoclonal protein with kappa light chain specificity; immunoglobulins normal; serum kappa light chains mildly elevated (34) History of DVT 11/26/2020-age indeterminate DVT involving the left peroneal veins.  6 months of anticoagulation completed.  Evaluated by Dr. Federico.  Felt to be provoked. CKD History of gout History of hepatitis C Hypothyroid Diabetes Hypertension Neuropathy Panniculectomy 09/26/2020, postoperative wound infection Osteoporosis    Disposition: Teresa Golden has a monoclonal gammopathy of unknown  significance.  We reviewed the labs from 05/28/2023.  We reviewed the CBC from today which is normal.  We will contact her with outstanding lab results.  She had COVID last month.  She has a persistent cough, abnormal lung exam.  She does not appear ill.  We are referring her for a chest x-ray.  She will follow-up with her PCP.  She will return for lab and follow-up in 6 months.    Olam Ned ANP/GNP-BC   12/08/2023  9:35 AM

## 2023-12-09 ENCOUNTER — Other Ambulatory Visit: Payer: Self-pay

## 2023-12-09 ENCOUNTER — Telehealth: Payer: Self-pay

## 2023-12-09 LAB — KAPPA/LAMBDA LIGHT CHAINS
Kappa free light chain: 45.5 mg/L — ABNORMAL HIGH (ref 3.3–19.4)
Kappa, lambda light chain ratio: 1.87 — ABNORMAL HIGH (ref 0.26–1.65)
Lambda free light chains: 24.3 mg/L (ref 5.7–26.3)

## 2023-12-09 NOTE — Patient Outreach (Signed)
 Complex Care Management   Visit Note  12/09/2023  Name:  Teresa Golden MRN: [id number] DOB: 12/14/1945  Situation: Referral received for Complex Care Management related to Diabetes with Complications and Hypertension, Polyneuropathy, CKD, s/p COVID 19 with persistent cough, reoccurring UTI.  I obtained verbal consent from Patient.  Visit completed with Patient on the phone.  Background:   Past Medical History:  Diagnosis Date   Arthritis    Chronic kidney disease    self reports ckd stage 3    Depression    Diabetes mellitus, type II, insulin  dependent (HCC)    With neurologic complications. 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    Hypertensive nephropathy 07/05/2018   Hypothyroidism    Hypothyroidism 02/06/2018   Morbid obesity with BMI of 50.0-59.9, adult (HCC)    Scoliosis     Assessment: Patient Reported Symptoms:  Cognitive Cognitive Status: Alert and oriented to person, place, and time, Normal speech and language skills Cognitive/Intellectual Conditions Management [RPT]: Behavior Disorders Behavior Disorders: Major Depressive Disorder, reoccurring; Relationship issues between partners   Health Maintenance Behaviors: Annual physical exam, Healthy diet, Immunizations, Spiritual practice(s), Social activities, Stress management Health Facilitated by: Stress management, Rest, Prayer/meditation, Healthy diet  Neurological Neurological Review of Symptoms: Headaches (sinus headache) Neurological Management Strategies: Adequate rest, Medication therapy, Routine screening Neurological Self-Management Outcome: 3 (uncertain)  HEENT HEENT Symptoms Reported: Nasal discharge, Mouth dryness, Other: (post nasal drainage, sinus pressure) HEENT  Management Strategies: Medication therapy, Routine screening HEENT Self-Management Outcome: 3 (uncertain)    Cardiovascular Cardiovascular Symptoms Reported: Other: Other Cardiovascular Symptoms: elevated BP while taking OTC cold medicine Does patient have uncontrolled Hypertension?: No Cardiovascular Management Strategies: Routine screening, Medication therapy Cardiovascular Self-Management Outcome: 3 (uncertain)  Respiratory Respiratory Symptoms Reported: Productive cough Other Respiratory Symptoms: post COVID 19 infection x 3 weeks Respiratory Management Strategies: Routine screening, Medication therapy Respiratory Self-Management Outcome: 3 (uncertain)  Endocrine Endocrine Symptoms Reported: Increased thirst Is patient diabetic?: Yes Is patient checking blood sugars at home?: Yes List most recent blood sugar readings, include date and time of day: not taken today Endocrine Self-Management Outcome: 4 (good)  Gastrointestinal Gastrointestinal Symptoms Reported: Change in appetite Gastrointestinal Management Strategies: Adequate rest, Fluid modification Gastrointestinal Self-Management Outcome: 3 (uncertain)    Genitourinary Genitourinary Symptoms Reported: Frequency Genitourinary Management Strategies: Fluid modification Genitourinary Self-Management Outcome: 3 (uncertain) Genitourinary Comment: recurrent UTI  Integumentary Integumentary Symptoms Reported: No symptoms reported    Musculoskeletal Musculoskelatal Symptoms Reviewed: No symptoms reported        Psychosocial Psychosocial Symptoms Reported: Alteration in eating habits   Major Change/Loss/Stressor/Fears (CP): Medical condition, self, Relationship concerns Techniques to Cope with Loss/Stress/Change: Counseling, Medication, Spiritual practice(s), Diversional activities Quality of Family Relationships: supportive Do you feel physically threatened by others?: No    12/09/2023    PHQ2-9 Depression Screening   Teresa Golden  interest or pleasure in doing things    Feeling down, depressed, or hopeless    PHQ-2 - Total Score    Trouble falling or staying asleep, or sleeping too much    Feeling tired or having Teresa Golden energy    Poor appetite or overeating     Feeling bad about yourself - or that you are a failure or have let  yourself or your family down    Trouble concentrating on things, such as reading the newspaper or watching television    Moving or speaking so slowly that other people could have noticed.  Or the opposite - being so fidgety or restless that you have been moving around a lot more than usual    Thoughts that you would be better off dead, or hurting yourself in some way    PHQ2-9 Total Score    If you checked off any problems, how difficult have these problems made it for you to do your work, take care of things at home, or get along with other people    Depression Interventions/Treatment      There were no vitals filed for this visit.  Medications Reviewed Today     Reviewed by Morgan Clayborne CROME, RN (Registered Nurse) on 12/09/23 at 1154  Med List Status: <None>   Medication Order Taking? Sig Documenting Provider Last Dose Status Informant  allopurinol  (ZYLOPRIM ) 100 MG tablet [id number]  Take 1 tablet by mouth once daily Petrina Pries, NP  Active   amLODipine  (NORVASC ) 5 MG tablet [id number]  Take 1 tablet by mouth once daily Jarold Medici, MD  Active   augmented betamethasone  dipropionate (DIPROLENE -AF) 0.05 % cream [id number]  APPLY CREAM TOPICALLY TO AFFECTED AREA TWICE DAILY [provider]  Active   AZO-CRANBERRY PO [id number]  Take 1 tablet by mouth daily. [provider]  Active Self  cetirizine  (ZYRTEC ) 10 MG tablet [id number]  Take 1 tablet (10 mg total) by mouth daily.  Patient taking differently: Take 10 mg by mouth daily as needed for allergies.   Petrina Pries, NP  Active   Cholecalciferol 5000 units TABS [id number]  Take 5,000 Units by mouth daily. [provider]  Active Nursing Home Medication Administration Guide (MAG)           Med Note ALLEGRA, [place] I   Fri Aug 07, 2017  6:51 PM)    diclofenac  Sodium (VOLTAREN ) 1 % GEL [id number]  APPLY 2 GRAMS TO AFFECTED AREA(S) 4 TIMES DAILY AS NEEDED Jarold Medici, MD  Active   empagliflozin  (JARDIANCE ) 25 MG TABS tablet [id number]  Take 1 tablet (25 mg total) by mouth daily before breakfast. Jarold Medici, MD  Active   gabapentin  (NEURONTIN ) 300 MG capsule [id number]  Take 2 capsules (600 mg total) by mouth at bedtime. Tobie Franky SQUIBB, DPM  Active   glucose blood [facility] VERIO) test strip [id number]  USE TO CHECK BLOOD SUGAR   TWO TIMES A DAY AS         INSTRUCTED Jarold Medici, MD  Active   Insulin  Degludec (TRESIBA ) 100 UNIT/ML SOLN [id number]  Inject 10 Units/oz/day into the skin at bedtime. Patient is taking at bedtime [provider]  Active   Lancets [facility] CATHRYNE PLUS [place]) MISC [id number]  USE TO CHECK BLOOD SUGARS  TWO TIMES A DAY AS         INSTRUCTED Jarold Medici, MD  Active   levothyroxine  (SYNTHROID ) 25 MCG tablet [id number]  Take 1/2 tablet daily. Jarold Medici, MD  Active   losartan  (COZAAR ) 100 MG tablet [id number]  TAKE 1 TABLET DAILY Jarold Medici, MD  Active   metoprolol  succinate (TOPROL -XL) 25 MG 24 hr tablet [id number]  TAKE 1 TABLET AT BEDTIME   (DISCONTINUE COREG ) Jarold Medici, MD  Active   Multiple Vitamins-Minerals (MULTIVITAMIN WITH MINERALS) tablet [id number]  Take 1 tablet by mouth daily. Women's 50+ [provider]  Active  Nursing Home Medication Administration Guide (MAG)           Med Note ALLEGRA, [place] I   Fri Aug 07, 2017  6:53 PM)    nirmatrelvir /ritonavir , renal dosing, (PAXLOVID ) 10 x 150 MG & 10 x 100MG  TBPK [id number]  Take nirmatrelvir  (150mg ) one tablet twice daily for 5 days and ritonavir  (100 mg) one tablet twice daily for 5 days.  Patient not taking: Reported on 12/09/2023   Christopher Savannah, PA-C  Active   pantoprazole  (PROTONIX ) 40  MG tablet [id number]  Take 40 mg by mouth daily as needed. [provider]  Active   promethazine -dextromethorphan (PROMETHAZINE -DM) 6.25-15 MG/5ML syrup [id number]  Take 2.5 mLs by mouth 3 (three) times daily as needed for cough. Christopher Savannah, PA-C  Active   Semaglutide , 1 MG/DOSE, (OZEMPIC , 1 MG/DOSE,) 4 MG/3ML SOPN [id number]  Inject 1 mg into the skin once a week. Jarold Medici, MD  Active    Patient not taking:   Discontinued 11/06/20 1600 (Reorder) simvastatin  (ZOCOR ) 10 MG tablet [id number]  Take 1 tablet by mouth once daily Jarold Medici, MD  Active   triamcinolone ointment (KENALOG) 0.1 % [id number]  Apply 1 Application topically 2 (two) times daily. [provider]  Active   Med List Note Marisa Nathanel SAILOR, CPhT 11/02/20 2136): Admitted to [facility] on 10/22/2020 [facility] [place]) [place], [place]            Recommendation:   Acute PCP follow-up as directed for urine recheck and persistent cough.  Follow Up Plan:   Telephone follow up appointment date/time:  Thursday, November 20 at 10:30 AM  Clayborne Ly RN BSN CCM [place]  [facility], [facility] Nurse Care Coordinator  Direct Dial: [phone] Website: [email or web address]

## 2023-12-09 NOTE — Telephone Encounter (Signed)
 Patient gave verbal understanding and had no further questions.

## 2023-12-09 NOTE — Telephone Encounter (Signed)
-----   Message from Olam Ned sent at 12/08/2023  4:12 PM EDT ----- Please let her know CXR was negative, follow up with PCP for persistent cough.

## 2023-12-09 NOTE — Patient Instructions (Signed)
 Visit Information  Thank you for taking time to visit with me today. Please don't hesitate to contact me if I can be of assistance to you before our next scheduled appointment.  Your next care management appointment is by telephone on Thursday, November 20 at 10:30 AM  Please call the care guide team at [phone] if you need to cancel, schedule, or reschedule an appointment.   Please call [phone]-TALK (toll free, 24 hour hotline) if you are experiencing a Mental Health or Behavioral Health Crisis or need someone to talk to.  Clayborne Ly RN BSN CCM [place]  [facility], [facility] Nurse Care Coordinator  Direct Dial: [phone] Website: [email or web address]

## 2023-12-11 ENCOUNTER — Other Ambulatory Visit: Payer: Self-pay | Admitting: Internal Medicine

## 2023-12-11 ENCOUNTER — Telehealth: Payer: Self-pay

## 2023-12-11 ENCOUNTER — Ambulatory Visit (INDEPENDENT_AMBULATORY_CARE_PROVIDER_SITE_OTHER): Admitting: Family Medicine

## 2023-12-11 ENCOUNTER — Telehealth: Payer: Self-pay | Admitting: Internal Medicine

## 2023-12-11 ENCOUNTER — Encounter: Payer: Self-pay | Admitting: Family Medicine

## 2023-12-11 VITALS — BP 120/64 | HR 57 | Temp 98.2°F | Ht 65.0 in | Wt 213.0 lb

## 2023-12-11 DIAGNOSIS — N3000 Acute cystitis without hematuria: Secondary | ICD-10-CM

## 2023-12-11 DIAGNOSIS — N1831 Chronic kidney disease, stage 3a: Secondary | ICD-10-CM

## 2023-12-11 DIAGNOSIS — U099 Post covid-19 condition, unspecified: Secondary | ICD-10-CM | POA: Diagnosis not present

## 2023-12-11 DIAGNOSIS — E1122 Type 2 diabetes mellitus with diabetic chronic kidney disease: Secondary | ICD-10-CM

## 2023-12-11 DIAGNOSIS — Z794 Long term (current) use of insulin: Secondary | ICD-10-CM

## 2023-12-11 DIAGNOSIS — R053 Chronic cough: Secondary | ICD-10-CM | POA: Diagnosis not present

## 2023-12-11 LAB — POCT URINALYSIS DIP (CLINITEK)
Bilirubin, UA: NEGATIVE
Glucose, UA: 500 mg/dL — AB
Ketones, POC UA: NEGATIVE mg/dL
Nitrite, UA: POSITIVE — AB
POC PROTEIN,UA: NEGATIVE
Spec Grav, UA: 1.01 (ref 1.010–1.025)
Urobilinogen, UA: 0.2 U/dL
pH, UA: 5.5 (ref 5.0–8.0)

## 2023-12-11 MED ORDER — CEPHALEXIN 500 MG PO CAPS: 500.0000 mg | ORAL_CAPSULE | Freq: Three times a day (TID) | ORAL | 0 refills | Status: DC

## 2023-12-11 MED ORDER — BENZONATATE 100 MG PO CAPS: 100.0000 mg | ORAL_CAPSULE | Freq: Three times a day (TID) | ORAL | 0 refills | Status: AC | PRN

## 2023-12-11 MED ORDER — TRIAMCINOLONE ACETONIDE 40 MG/ML IJ SUSP
40.0000 mg | Freq: Once | INTRAMUSCULAR | Status: AC
  Administered 2023-12-11: 40 mg via INTRAMUSCULAR

## 2023-12-11 MED ORDER — AIRSUPRA 90-80 MCG/ACT IN AERO: 2.0000 | INHALATION_SPRAY | Freq: Four times a day (QID) | RESPIRATORY_TRACT | 3 refills | Status: DC | PRN

## 2023-12-11 NOTE — Telephone Encounter (Signed)
Please see additional telephone encounter.

## 2023-12-11 NOTE — Telephone Encounter (Signed)
 This RN initiated a separate refill request with the appropriate pharmacy attached.   Copied from CRM [id number]. Topic: Clinical - Prescription Issue >> Dec 11, 2023 11:32 AM Hadassah PARAS wrote: Reason for CRM: Prescription was sent to the wrong pharmacy. Please send to    [facility] Address: [address] [place], [place] [address] Phone: [phone]  Albuterol -Budesonide (AIRSUPRA) 90-80 MCG/ACT AERO [[id number]] benzonatate (TESSALON PERLES) 100 MG capsule cephALEXin  (KEFLEX ) 500 MG capsule  Please advise #[id number]

## 2023-12-11 NOTE — Telephone Encounter (Unsigned)
 Copied from CRM #[id number]. Topic: Clinical - Medication Question >> Dec 11, 2023 11:59 AM Delon HERO wrote: Reason for CRM: Pharmacy is calling to report the patient has a steroid allergy - as Albuterol -Budesonide (AIRSUPRA) 90-80 MCG/ACT AERO [[id number]] was sent. Please change and advise

## 2023-12-11 NOTE — Progress Notes (Signed)
 I,Jameka Teresa Golden, CMA,acting as a neurosurgeon for [employer], NP.,have documented all relevant documentation on the behalf of Teresa Creighton, NP,as directed by  Teresa Creighton, NP while in the presence of Teresa Creighton, NP.  Subjective:  Patient ID: [name] , female    DOB: 11-16-1945 , 78 y.o.   MRN: [id number]  Chief Complaint  Patient presents with   Cough    Patient presents today for a lingering cough. Patient reports she recently had covid and has had a cough ever since.    Urinary Tract Infection    Patient reports she would like to make sure she doesn't still haven't a uti.     HPI Discussed the use of AI scribe software for clinical note transcription with the patient, who gave verbal consent to proceed.  History of Present Illness    Teresa Golden is a 78 year old female who presents with persistent congestion and cough following a COVID-19 infection.  She has been experiencing persistent congestion and cough since a COVID-19 infection approximately three weeks ago. She reports persistent congestion, which she describes as sounding like it is in her chest but clarifies it is primarily in her throat and nose, accompanied by a persistent cough. A chest x-ray was negative for pneumonia. She did not take Paxlovid  due to cost but used Mucinex maximum strength for two to three days, which she discontinued on advice from a nurse. She is currently taking 500 mg of vitamin C twice a day and "children's Zyrtec" .  She recently had a urinary tract infection (UTI) for which she was prescribed cephalexin  (Keflex ) three times a day for seven days, which she completed. Initially, she experienced pressure, pain, and burning during urination, but these symptoms have largely resolved. She continues to urinate frequently, which she attributes to increased fluid intake and her diabetes medication, Jardiance . She drinks Propel, apple juice, and water  to stay hydrated and takes Azo daily.  She has a  history of diabetes and is currently on Jardiance , Ozempic , and Tresiba . She monitors her blood sugar regularly, which usually ranges from 128 to 134 mg/dL at night. She wants to return to the gym and increase her physical activity.  In the review of symptoms, she denies shortness of breath but reports wheezing and a sensation of congestion. The mucus is now mostly clear, though it was previously thick and yellow. She experiences wheezing, especially at night, and has a history of bronchitis. She is not currently taking any specific medication for the cough aside from lozenges and has not used Tessalon pills before. She has not picked up promethazine  due to concerns about drowsiness.     Past Medical History:  Diagnosis Date   Arthritis    Chronic kidney disease    self reports ckd stage 3    Depression    Diabetes mellitus, type II, insulin  dependent (HCC)    With neurologic complications. 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    Hypertensive nephropathy 07/05/2018   Hypothyroidism    Hypothyroidism 02/06/2018   Morbid obesity with BMI of 50.0-59.9, adult (HCC)    Scoliosis      Family History  Problem Relation Age of Onset   Heart  attack Mother    Heart failure Mother    Stroke Father    Breast cancer Neg Hx    BRCA 1/2 Neg Hx      Current Outpatient Medications:    Albuterol -Budesonide (AIRSUPRA) 90-80 MCG/ACT AERO, Inhale 2 puffs into the lungs every 6 (six) hours as needed. (Patient not taking: Reported on 12/18/2023), Disp: 32.1 g, Rfl: 3   allopurinol  (ZYLOPRIM ) 100 MG tablet, Take 1 tablet by mouth once daily, Disp: 270 tablet, Rfl: 0   amLODipine  (NORVASC ) 5 MG tablet, Take 1 tablet by mouth once daily, Disp: 90 tablet, Rfl: 2    augmented betamethasone  dipropionate (DIPROLENE -AF) 0.05 % cream, APPLY CREAM TOPICALLY TO AFFECTED AREA TWICE DAILY, Disp: , Rfl:    AZO-CRANBERRY PO, Take 1 tablet by mouth daily., Disp: , Rfl:    benzonatate (TESSALON PERLES) 100 MG capsule, Take 1 capsule (100 mg total) by mouth 3 (three) times daily as needed., Disp: 30 capsule, Rfl: 0   cephALEXin  (KEFLEX ) 500 MG capsule, Take 1 capsule (500 mg total) by mouth 3 (three) times daily. (Patient not taking: Reported on 12/18/2023), Disp: 15 capsule, Rfl: 0   cetirizine  (ZYRTEC ) 10 MG tablet, Take 1 tablet (10 mg total) by mouth daily., Disp: 30 tablet, Rfl: 1   Cholecalciferol 5000 units TABS, Take 5,000 Units by mouth daily., Disp: , Rfl:    diclofenac  Sodium (VOLTAREN ) 1 % GEL, APPLY 2 GRAMS TO AFFECTED AREA(S) 4 TIMES DAILY AS NEEDED, Disp: 200 g, Rfl: 2   empagliflozin  (JARDIANCE ) 25 MG TABS tablet, Take 1 tablet (25 mg total) by mouth daily before breakfast., Disp: , Rfl:    gabapentin  (NEURONTIN ) 300 MG capsule, Take 2 capsules (600 mg total) by mouth at bedtime., Disp: 180 capsule, Rfl: 0   glucose blood (ONETOUCH VERIO) test strip, USE TO CHECK BLOOD SUGAR   TWO TIMES A DAY AS         INSTRUCTED, Disp: 200 strip, Rfl: 3   Insulin  Degludec (TRESIBA ) 100 UNIT/ML SOLN, Inject 10 Units/oz/day into the skin at bedtime. Patient is taking at bedtime, Disp: , Rfl:    Lancets (ONETOUCH DELICA PLUS LANCET33G) MISC, USE TO CHECK BLOOD SUGARS  TWO TIMES A DAY AS         INSTRUCTED, Disp: 200 each, Rfl: 3   levothyroxine  (SYNTHROID ) 25 MCG tablet, Take 1/2 tablet daily., Disp: 90 tablet, Rfl: 1   losartan  (COZAAR ) 100 MG tablet, TAKE 1 TABLET DAILY, Disp: 90 tablet, Rfl: 2   Multiple Vitamins-Minerals (MULTIVITAMIN WITH MINERALS) tablet, Take 1 tablet by mouth daily. Women's 50+, Disp: , Rfl:    pantoprazole  (PROTONIX ) 40 MG tablet, Take 40 mg by mouth daily as needed., Disp: , Rfl:    Semaglutide , 1 MG/DOSE, (OZEMPIC , 1 MG/DOSE,) 4 MG/3ML SOPN, Inject  1 mg into the skin once a week., Disp: 9 mL, Rfl: 1   simvastatin  (ZOCOR ) 10 MG tablet, Take 1 tablet by mouth once daily, Disp: 90 tablet, Rfl: 2   triamcinolone ointment (KENALOG) 0.1 %, Apply 1 Application topically 2 (two) times daily., Disp: , Rfl:    metoprolol  succinate (TOPROL -XL) 25 MG 24 hr tablet, TAKE 1 TABLET AT BEDTIME   (DISCONTINUE COREG ), Disp: 90 tablet, Rfl: 1   Allergies  Allergen Reactions   Meloxicam Other (See Comments)    Fever; muscle aches; flu-like symptoms Allergic, per MAR   Grass Pollen(K-O-R-T-Swt Vern) Other (See Comments)    Sinus inflammation    Other Other (See Comments)  Rose fever and hay fever     Review of Systems  Constitutional: Negative.   Respiratory:  Positive for cough and shortness of breath.   Cardiovascular: Negative.   Gastrointestinal: Negative.   Psychiatric/Behavioral:  The patient is nervous/anxious.      Today's Vitals   12/11/23 0928  BP: 120/64  Pulse: (!) 57  Temp: 98.2 F (36.8 C)  TempSrc: Oral  Weight: 213 lb (96.6 kg)  Height: 5' 5 (1.651 m)   Body mass index is 35.45 kg/m.  Wt Readings from Last 3 Encounters:  12/11/23 213 lb (96.6 kg)  12/08/23 213 lb (96.6 kg)  10/12/23 214 lb 3.2 oz (97.2 kg)    The 10-year ASCVD risk score (Arnett DK, et al., 2019) is: 36.3%   Values used to calculate the score:     Age: 33 years     Clincally relevant sex: Female     Is Non-Hispanic African American: Yes     Diabetic: Yes     Tobacco smoker: No     Systolic Blood Pressure: 120 mmHg     Is BP treated: Yes     HDL Cholesterol: 70 mg/dL     Total Cholesterol: 170 mg/dL  Objective:  Physical Exam Constitutional:      Appearance: Normal appearance.  Cardiovascular:     Rate and Rhythm: Regular rhythm. Bradycardia present.     Pulses: Normal pulses.     Heart sounds: Normal heart sounds.  Pulmonary:     Effort: Pulmonary effort is normal.     Breath sounds: Normal breath sounds.  Abdominal:      General: Bowel sounds are normal.  Neurological:     Mental Status: She is alert and oriented to person, place, and time. Mental status is at baseline.         Assessment And Plan:  Post-COVID chronic cough -     Benzonatate; Take 1 capsule (100 mg total) by mouth 3 (three) times daily as needed.  Dispense: 30 capsule; Refill: 0 -     Airsupra; Inhale 2 puffs into the lungs every 6 (six) hours as needed. (Patient not taking: Reported on 12/18/2023)  Dispense: 32.1 g; Refill: 3 -     Triamcinolone Acetonide  Acute cystitis without hematuria Assessment & Plan: Most of her symptoms have resolved, will give antibiotics for 3 more days  Orders: -     POCT URINALYSIS DIP (CLINITEK) -     Cephalexin ; Take 1 capsule (500 mg total) by mouth 3 (three) times daily. (Patient not taking: Reported on 12/18/2023)  Dispense: 15 capsule; Refill: 0  Type 2 diabetes mellitus with stage 3a chronic kidney disease, with long-term current use of insulin  [facility]) Assessment & Plan: Lab Results  Component Value Date   HGBA1C 6.4 (H) 09/01/2023   HGBA1C 6.5 (H) 04/08/2023   HGBA1C 6.6 (H) 10/07/2022     Give low dose kenalog 40 mg injection     Return if symptoms worsen or fail to improve.  Patient was given opportunity to ask questions. Patient verbalized understanding of the plan and was able to repeat key elements of the plan. All questions were answered to their satisfaction.    I, Teresa Creighton, NP, have reviewed all documentation for this visit. The documentation on 12/25/2023 for the exam, diagnosis, procedures, and orders are all accurate and complete.   IF YOU HAVE BEEN REFERRED TO A SPECIALIST, IT MAY TAKE 1-2 WEEKS TO SCHEDULE/PROCESS THE REFERRAL. IF YOU HAVE NOT HEARD  FROM US /SPECIALIST IN TWO WEEKS, PLEASE GIVE US  A CALL AT [phone].

## 2023-12-14 ENCOUNTER — Other Ambulatory Visit: Payer: Self-pay | Admitting: Internal Medicine

## 2023-12-14 ENCOUNTER — Ambulatory Visit: Payer: Self-pay

## 2023-12-14 LAB — MULTIPLE MYELOMA PANEL, SERUM
Albumin SerPl Elph-Mcnc: 3.6 g/dL (ref 2.9–4.4)
Albumin/Glob SerPl: 1 (ref 0.7–1.7)
Alpha 1: 0.2 g/dL (ref 0.0–0.4)
Alpha2 Glob SerPl Elph-Mcnc: 0.9 g/dL (ref 0.4–1.0)
B-Globulin SerPl Elph-Mcnc: 1.1 g/dL (ref 0.7–1.3)
Gamma Glob SerPl Elph-Mcnc: 1.5 g/dL (ref 0.4–1.8)
Globulin, Total: 3.7 g/dL (ref 2.2–3.9)
IgA: 275 mg/dL (ref 64–422)
IgG (Immunoglobin G), Serum: 1519 mg/dL (ref 586–1602)
IgM (Immunoglobulin M), Srm: 130 mg/dL (ref 26–217)
M Protein SerPl Elph-Mcnc: 0.4 g/dL — ABNORMAL HIGH
Total Protein ELP: 7.3 g/dL (ref 6.0–8.5)

## 2023-12-14 NOTE — Telephone Encounter (Signed)
 Patient was seen on Friday and prescribed AirSupra inhaler. This is inhaler is $300-coupon patient was given couldn't be used due to her insurance. Patient reports continued cough and wheezing. Patient is asking for assistance with getting a new inhaler. Patient is asking for Clayborne to call her back.  FYI Only or Action Required?: Action required by provider: clinical question for provider.  Patient was last seen in primary care on 12/11/2023 by Petrina Pries, NP.  Called Nurse Triage reporting Cough.  Symptoms began several days ago.  Interventions attempted: Rest, hydration, or home remedies.  Symptoms are: unchanged.  Triage Disposition: See HCP Within 4 Hours (Or PCP Triage)  Patient/caregiver understands and will follow disposition?: No, wishes to speak with PCP  Copied from CRM #[id number]. Topic: Clinical - Red Word Triage >> Dec 14, 2023  8:47 AM Leonette SQUIBB wrote: Red Word that prompted transfer to Nurse Triage: Patient called saying she has been prescribed an inhaler that she can not afford.  It was 300.00/  she was in Friday.  She has had a hard time all weekend breathing .  She needs an inhaler asap. Reason for Disposition  [1] MILD difficulty breathing (e.g., minimal/no SOB at rest, SOB with walking, pulse < 100) AND [2] still present when not coughing  (Exception: No change from usual, chronic shortness of breath.)  Answer Assessment - Initial Assessment Questions 1. ONSET: When did the cough begin?      Cough has been lingering since COVID DX 9/18 2. SEVERITY: How bad is the cough today?      Mild-moderate 3. SPUTUM: Describe the color of your sputum (e.g., none, dry cough; clear, [place], yellow, green)     Most of the time sputum is Dellis but is coughing up yellow at times 4. HEMOPTYSIS: Are you coughing up any blood? If so ask: How much? (e.g., flecks, streaks, tablespoons, etc.)     no 5. DIFFICULTY BREATHING: Are you having difficulty breathing? If Yes, ask:  How bad is it? (e.g., mild, moderate, severe)      mild 6. FEVER: Do you have a fever? If Yes, ask: What is your temperature, how was it measured, and when did it start?     no 7. CARDIAC HISTORY: Do you have any history of heart disease? (e.g., heart attack, congestive heart failure)      no 8. LUNG HISTORY: Do you have any history of lung disease?  (e.g., pulmonary embolus, asthma, emphysema)     no 9. PE RISK FACTORS: Do you have a history of blood clots? (or: recent major surgery, recent prolonged travel, bedridden)     yes 10. OTHER SYMPTOMS: Do you have any other symptoms? (e.g., runny nose, wheezing, chest pain)       wheezing 12. TRAVEL: Have you traveled out of the country in the last month? (e.g., travel history, exposures)       no  Protocols used: Cough - Chronic-A-AH

## 2023-12-16 ENCOUNTER — Ambulatory Visit: Admitting: Psychiatry

## 2023-12-16 DIAGNOSIS — R69 Illness, unspecified: Secondary | ICD-10-CM | POA: Diagnosis not present

## 2023-12-16 DIAGNOSIS — F3341 Major depressive disorder, recurrent, in partial remission: Secondary | ICD-10-CM

## 2023-12-16 DIAGNOSIS — Z599 Problem related to housing and economic circumstances, unspecified: Secondary | ICD-10-CM

## 2023-12-16 DIAGNOSIS — Z87898 Personal history of other specified conditions: Secondary | ICD-10-CM | POA: Diagnosis not present

## 2023-12-16 NOTE — Progress Notes (Signed)
 Psychotherapy Progress Note [facility] Jodie Kendall, PhD LP  [name] [facility])    MRN: [id number] Therapy format: Individual psychotherapy Date: 12/16/2023      Start: 8:20a     Stop: 9:10a     Time Spent: 50 min Location: In-person   Session narrative (presenting needs, interim history, self-report of stressors and symptoms, applications of prior therapy, status changes, and interventions made in session) Bronchitis post COVID, got recommended to an inhaler but couldn't get it, says the people she was recommended to (patient assistance?) won't cover it.  Has been getting Tresiba , Ozempic  no longer covered.  In process of working with a [facility] rep who can help work out med costs. Feels her breathing is getting better, chest xray at her myeloma checkup showed not pneumonia.  Had a UTI piggyback on it.  Thought at one point she might die.  Not now, but coughing hard at times.  Allowed discretion, but if inhaler still recommended, encouraged to work it through.  Now considering going back to school, despite nearing 79.  Says her thinking is just that she need to make more money.  Looking into medical coding, which takes a 3-semester program.  Can get a laptop and equip for work from home.  Seems to think she can get full-ride financial aid, though that seems doubtful.  Says her memory is still good, and she likes the field.  Gently confronted about the prospect of her health staying good enough for it, admits she is worried that maybe she wouldn't be able.  Free to look into it, of course.  Did think while most sick that there's no one who would check on her, might be days to find her if she died.  Encouraged to establish a check on me relationship with someone, e.g., friend Rosaline from church, who is her key holder, and to be frank about it.  Therapeutic modalities: Cognitive Behavioral Therapy, Solution-Oriented/Positive Psychology, and  Ego-Supportive  Mental Status/Observations:  Appearance:   Casual     Behavior:  Appropriate  Motor:  Rollator  Speech/Language:   Clear and Coherent  Affect:  Appropriate  Mood:  wearied  Thought process:  normal  Thought content:    worry  Sensory/Perceptual disturbances:    WNL  Orientation:  Fully oriented  Attention:  Good    Concentration:  Good  Memory:  WNL  Insight:    Good  Judgment:   Variable  Impulse Control:  Variable   Risk Assessment: Danger to Self: No Self-injurious Behavior: No Danger to Others: No Physical Aggression / Violence: No Duty to Warn: No Access to Firearms a concern: No  Assessment of progress:  stabilized  Diagnosis:   ICD-10-CM   1. Major depressive disorder, recurrent, in partial remission  F33.41     2. Multiple comorbid health conditions  R69    incl post-COVID bronchitis    3. Housing or economic circumstances  Z59.9     4. History of alcohol  use disorder  Z87.898      Plan:  Socialization and relationships -- Endorse abstinence from dysfunctional relationships with men who use for sex, even if enjoyed, as they turn up empty.  Endorse limits with dysfunctional relatives as well, just encourage a standard of speaking limits, not just enacting them silently.  Endorse boundary/limits with Alfonso.  Endorse adoptive family relationships where present and strong friendships as enlarged family.  Continue involvement in church and suitable groups as able.  Develop local friendships and bonus mother-daughter relationship at interest.  Offer to support groups such as CoDA or Al-Anon, and active elder facilities like [employer], [employer].  Endorse option to work part-time if truly able and interested.  Endorse choice over dating, so long as she can keep her integrity, maintain the view that she is worth working for and being honest for, and does not have to make any desperate decisions to feel loved and lovable.  Lonely does not  have to mean desperate.  Endorse prospect of acquiring a roommate. Self-esteem -- Per her faith, continue to practice self-esteem grounded in God's unconditional love.  Self-affirm that whatever she might deem stupid, she has either learned, improved, or was doing the best she could figure out at the time, while trying to deal with strong and natural feelings, e.g., loneliness.  When tempted to feel like a failure for lack of family, or for acting quickly on desires, reframe self-image as a survivor who had to take on the harshest of losses and is legitimately tempted.  Acknowledge regrets when they come up, and reaffirm having learned from them.  Options to inventory wrongs a la 4th and 5th steps of AA and to write letters, or write and burn confessions, as motivated, but stay true that taking care of herself now is itself righting wrongs, retiring regrets, and enacting healthy self-esteem. General health -- Endorse any healthy activity, to include pool/gym if able and interested.  Address diabetes and renal self-care promptly and reliably.  Recommend taming light in late evening and after bed, with options to use soothing sound and orange lenses. Living situation -- Endorse researching more reliable, better suited living space, and as needed.  As needed, research tenant's rights in the event landlady defaults on the mortgage.  Most likely no longer in the market for home ownership.  For housekeeping, try to make a practice of smaller, more frequent efforts and/or bringing in help as needed, with the mindset that it's all a gift to the lady who lives here.   Other recommendations/advice -- As may be noted above.  Continue to utilize previously learned skills ad lib. Medication compliance -- Maintain medication as prescribed and work faithfully with relevant prescriber(s) if any changes are desired or seem indicated. Crisis service -- Aware of call list and work-in appts.  Call the clinic on-call service,  988/hotline, 911, or present to [facility] or ER if any life-threatening psychiatric crisis. Followup -- Return for time as already scheduled.  Next scheduled visit with me 01/06/2024.  Next scheduled in this office 01/06/2024.  Lamar Kendall, PhD Jodie Kendall, PhD LP Clinical Psychologist, [facility] [address], [address] [place], [place] [address] [phone]

## 2023-12-17 ENCOUNTER — Telehealth: Payer: Self-pay | Admitting: Pharmacist

## 2023-12-17 ENCOUNTER — Telehealth: Payer: Self-pay

## 2023-12-17 ENCOUNTER — Telehealth: Payer: Self-pay | Admitting: *Deleted

## 2023-12-17 DIAGNOSIS — I890 Lymphedema, not elsewhere classified: Secondary | ICD-10-CM

## 2023-12-17 DIAGNOSIS — U099 Post covid-19 condition, unspecified: Secondary | ICD-10-CM

## 2023-12-17 NOTE — Patient Outreach (Addendum)
 Received voice message from patient regarding assistance that is needed to help her afford the cost of a new inhaler prescribed. Determined patient spoke with Josette Pellet RN to advise her cost for Airsupra inhaler is $300 and she is unable to pay this amount. Placed a pharmacy referral requesting outreach to patient to further assist and PCP was made aware.   Clayborne Ly RN BSN CCM [place]  [facility], [facility] Nurse Care Coordinator  Direct Dial: [phone] Website: [email or web address]

## 2023-12-17 NOTE — Patient Outreach (Signed)
 12/17/2023  Received a call from patient regarding Airsupra. Cost is prohibitive at $300. Unable to use copay card with Medicare Part D insurance. Chart review indicates that the pharmacy called PCP office on 10/17 with concerns about the budesonide component due to a steroid allergy. I discussed this with the patient and she reports elevated blood sugar levels with prednisone  use but no true allergy to steroids.   She states that her breathing and cough is a lot better than it was. She is using coricidin and Mucinex Extra Strength OTC. Encouraged to increase fluid intake to thin secretions. The Mucinex she is taking is just guaifenesin 1200mg  and doesn't contain a decongestant or dextromethorphan. I advised to avoid those. I consulted with Clayborne Ly, RN Care Manager and she will send a referral to Cassius Brought, PharmD request assistance with cost of medication or medication change. I plan to follow up with the patient by telephone tomorrow morning.   Staff message sent to [facility] with this information and requested that they call the patient with any recommendations or to provide reassurance.   Josette Pellet, RN, BSN [place]  [facility] RN Care Manager Direct Dial: [phone]  Fax: [phone]

## 2023-12-18 ENCOUNTER — Telehealth: Payer: Self-pay

## 2023-12-18 ENCOUNTER — Encounter: Payer: Self-pay | Admitting: *Deleted

## 2023-12-18 ENCOUNTER — Other Ambulatory Visit: Payer: Self-pay | Admitting: *Deleted

## 2023-12-18 NOTE — Patient Outreach (Signed)
 12/18/2023  Verified that patient spoke with Cassius Brought, [facility] yesterday and she is sending her patient assistance forms for Airsupra. Encouraged patient to reach out to Care Management team as needed and to follow-up with PCP as planned and sooner if necessary for any new or worsening symptoms.   Josette Pellet, RN, BSN [place]  [facility] RN Care Manager Direct Dial: [phone]  Fax: [phone]

## 2023-12-18 NOTE — Telephone Encounter (Signed)
 PAP: Patient assistance application for Airsupra through [employer] ([employer]) has been mailed to pt's home address on file. Provider portion of application will be faxed to provider's office.

## 2023-12-18 NOTE — Progress Notes (Signed)
   12/18/2023  [name], female   DOB: 1945/05/16, 78 y.o.   MRN: [id number]  Patient was called regarding medication assistance with Airsupra.  HIPAA identifiers were obtained.  Patient said she was given a coupon in clinic to help with the cost of Airsupra but manufacturer coupons cannot be used with Medicare.  She reported the cost as >$200.00  Airsupra is a tier 3 medication on her insurance plan which as a 24% coinsurance.  Meaning she is responsible for 24% of the cost of the medication versus a set copay.  Jardiance  renewal application has already been sent. Patient communicated understanding about receiving two different applications in the mail.  A note will be sent to the clinic staff to see if Airsupra samples are available.  Lab Results  Component Value Date   HGBA1C 6.4 (H) 09/01/2023   HGBA1C 6.5 (H) 04/08/2023   HGBA1C 6.6 (H) 10/07/2022     Teresa Golden, PharmD, BCACP Clinical Pharmacist [phone]

## 2023-12-23 ENCOUNTER — Telehealth: Payer: Self-pay | Admitting: Pharmacist

## 2023-12-23 DIAGNOSIS — E1122 Type 2 diabetes mellitus with diabetic chronic kidney disease: Secondary | ICD-10-CM

## 2023-12-23 NOTE — Progress Notes (Signed)
   12/23/2023  [name], female   DOB: 30-May-1945, 78 y.o.   MRN: [id number]  Patient called and wondered about the Airsupra patient application form because she had not received it in the mail.  HIPAA identifers were obtained.  Patient was assured the application was on the way but that all mail has to go through the [employer].  Luke Mall, CPhT documented that she mailed the application 12/18/23.  In addition, provider signatures were obtained on her application for Farxiga  through [employer].  The completed application was faxed back to Luke Mall, CPhT.  Teresa Golden, PharmD, BCACP Clinical Pharmacist [phone]

## 2023-12-24 NOTE — Telephone Encounter (Signed)
 Received provider portion PAP Application [employer] for Airsupra.

## 2023-12-25 NOTE — Assessment & Plan Note (Signed)
 Lab Results  Component Value Date   HGBA1C 6.4 (H) 09/01/2023   HGBA1C 6.5 (H) 04/08/2023   HGBA1C 6.6 (H) 10/07/2022     Give low dose kenalog 40 mg injection

## 2023-12-25 NOTE — Assessment & Plan Note (Signed)
 Most of her symptoms have resolved, will give antibiotics for 3 more days

## 2023-12-29 ENCOUNTER — Telehealth (HOSPITAL_BASED_OUTPATIENT_CLINIC_OR_DEPARTMENT_OTHER): Payer: Self-pay

## 2023-12-30 ENCOUNTER — Ambulatory Visit: Admitting: Psychiatry

## 2024-01-04 ENCOUNTER — Ambulatory Visit: Payer: Self-pay

## 2024-01-05 ENCOUNTER — Telehealth: Payer: Self-pay

## 2024-01-05 ENCOUNTER — Ambulatory Visit: Admitting: Psychiatry

## 2024-01-05 NOTE — Telephone Encounter (Signed)
 PAP: Patient assistance application for Tresiba  through [employer] has been mailed to pt's home address on file. Provider portion of application will be faxed to provider's office. Patient portion Pap application e-filed.

## 2024-01-06 ENCOUNTER — Ambulatory Visit: Admitting: Psychiatry

## 2024-01-06 ENCOUNTER — Telehealth: Payer: Self-pay | Admitting: Pharmacist

## 2024-01-06 DIAGNOSIS — F3341 Major depressive disorder, recurrent, in partial remission: Secondary | ICD-10-CM | POA: Diagnosis not present

## 2024-01-06 DIAGNOSIS — Z794 Long term (current) use of insulin: Secondary | ICD-10-CM

## 2024-01-06 DIAGNOSIS — Z599 Problem related to housing and economic circumstances, unspecified: Secondary | ICD-10-CM

## 2024-01-06 DIAGNOSIS — R69 Illness, unspecified: Secondary | ICD-10-CM

## 2024-01-06 NOTE — Progress Notes (Signed)
   01/06/2024  [name], female   DOB: 17-Oct-1945, 78 y.o.   MRN: [id number]  Patient came into the office to bring in her patient assistance paper work.HIPAA identifiers were obtained.  She brought in AZ[employer]Me -Airsupra and [employer]-Jardiance .  The Provider portion for 2026 renewal of Tresiba  and novo fine needles as well as both the patient portion and provider portion for Airsupra and Jardiance  were faxed back to Teresa Golden, CPhT for processing and scanning.  Cassius DOROTHA Brought, PharmD, BCACP Clinical Pharmacist [phone]

## 2024-01-06 NOTE — Progress Notes (Signed)
 Psychotherapy Progress Note [facility] Jodie Kendall, PhD LP  [name] [facility])    MRN: [id number] Therapy format: Individual psychotherapy Date: 01/06/2024      Start: 9:09a     Stop: 10:08a     Time Spent: 59 min Location: In-person   Session narrative (presenting needs, interim history, self-report of stressors and symptoms, applications of prior therapy, status changes, and interventions made in session) Things are going better.  Volunteering with church food bank, and applied to [facility], will check with fin aid office about affording a laptop.  Discussed more affordable options, like a Chromebook, or a refurb, if she finds she has to foot her own bill for that.  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  Found [employer] has a link for jobs for seniors.  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    Remains on Ozempic  and Tresiba , trying to procure an expensive inhaler.  Complicated patient assistance programs, and may be coming off injectables anyway.    Booked a vacation with Doyal to [facility] next June.  Also back on considering whether to team up as roommates next year.    Interrupted by phone call in session, witnessed her responding assertively to a scam.  Says they happen.  Overall, feeling better, sees herself taking better care of herself.  Better physically for recovering from UTI and getting better hydrated.  Considering B12 shots.  Recommended oral B complex for maintenance.  Exie has kept calling, and sometimes feeling her out for sexual willingness.  Acknowledges it's a form of old friendship, and he's not all bad, just entirely too chauvinistic to be in that kind of relationship with.  PHQ-9 = 5 today.  Feels better about the issue of shaming herself.  Reiterates learning that her mother was a  psychopath and unlearning it was her own fault.  Reiterates her hx of unwed motherhood as a minor, F forbidding her to come home with the baby, and being visited by her BF Jimmy's mother Duanne at the unwed mother home, how F kept a letter from her Rutha wrote from basic training, offering an alternative to live with her mother and finish school at [employer] in [place].  Life could have been entirely different had she been allowed to known choose this, and be treated as capable.  Tearful on recalling this.  Support/validation provided and explored what she thinks her F might tell her now, with benefit of seeing the afterlife, with validating suggestions  Therapeutic modalities: Cognitive Behavioral Therapy, Solution-Oriented/Positive Psychology, Faith-sensitive, and Gestalt/Psychodrama  Mental Status/Observations:  Appearance:   Casual and Neat     Behavior:  Appropriate  Motor:  Rollator  Speech/Language:   Clear and Coherent  Affect:  Appropriate  Mood:  dysthymic and more responsive  Thought process:  normal  Thought content:    WNL  Sensory/Perceptual disturbances:    WNL  Orientation:  Fully oriented  Attention:  Good    Concentration:  Fair  Memory:  WNL  Insight:    Good  Judgment:   Variable  Impulse Control:  Good   Risk Assessment: Danger to Self: No Self-injurious Behavior: No Danger to Others: No Physical Aggression / Violence: No Duty to Warn: No Access to Firearms a concern: No  Assessment of progress:  progressing  Diagnosis:   ICD-10-CM  1. Major depressive disorder, recurrent, in partial remission  F33.41     2. Multiple comorbid health conditions  R69     3. Housing or economic circumstances  Z59.9      Plan:  Socialization and relationships -- Endorse abstinence from dysfunctional relationships with men who use for sex, even if enjoyed, as they turn up empty.  Endorse limits with dysfunctional relatives as well, just encourage a standard of  speaking limits, not just enacting them silently.  Endorse boundary/limits with Alfonso.  Endorse adoptive family relationships where present and strong friendships as enlarged family.  Continue involvement in church and suitable groups as able.  Develop local friendships and bonus mother-daughter relationship at interest.  Offer to support groups such as CoDA or Al-Anon, and active elder facilities like [employer], [employer].  Endorse option to work part-time if truly able and interested.  Endorse choice over dating, so long as she can keep her integrity, maintain the view that she is worth working for and being honest for, and does not have to make any desperate decisions to feel loved and lovable.  Lonely does not have to mean desperate.  Endorse prospect of acquiring a roommate. Self-esteem -- Per her faith, continue to practice self-esteem grounded in God's unconditional love.  Self-affirm that whatever she might deem stupid, she has either learned, improved, or was doing the best she could figure out at the time, while trying to deal with strong and natural feelings, e.g., loneliness.  When tempted to feel like a failure for lack of family, or for acting quickly on desires, reframe self-image as a survivor who had to take on the harshest of losses and is legitimately tempted.  Acknowledge regrets when they come up, and reaffirm having learned from them.  Options to inventory wrongs a la 4th and 5th steps of AA and to write letters, or write and burn confessions, as motivated, but stay true that taking care of herself now is itself righting wrongs, retiring regrets, and enacting healthy self-esteem.  Option to imagine conversations with influential figures from her past now, benefit of seeing more life (or afterlife). General health -- Endorse any healthy activity, to include pool/gym if able and interested.  Address diabetes and renal self-care promptly and reliably.  Recommend taming  light in late evening and after bed, with options to use soothing sound and orange lenses. Living situation -- Endorse researching more reliable, better suited living space, and as needed.  As needed, research tenant's rights in the event landlady defaults on the mortgage.  Most likely no longer in the market for home ownership.  For housekeeping, try to make a practice of smaller, more frequent efforts and/or bringing in help as needed, with the mindset that it's all a gift to the lady who lives here.   Other recommendations/advice -- As may be noted above.  Continue to utilize previously learned skills ad lib. Medication compliance -- Maintain medication as prescribed and work faithfully with relevant prescriber(s) if any changes are desired or seem indicated. Crisis service -- Aware of call list and work-in appts.  Call the clinic on-call service, 988/hotline, 911, or present to [facility] or ER if any life-threatening psychiatric crisis. Followup -- Return for time as already scheduled.  Next scheduled visit with me 01/26/2024.  Next scheduled in this office 01/26/2024.  Lamar Kendall, PhD Jodie Kendall, PhD LP Clinical Psychologist, [facility] [address], [address] [place], [place]  [address] (o) [phone]

## 2024-01-07 ENCOUNTER — Other Ambulatory Visit (HOSPITAL_COMMUNITY): Payer: Self-pay

## 2024-01-08 NOTE — Telephone Encounter (Signed)
 PAP: Application for Bernadine has been submitted to [employer]), via fax

## 2024-01-08 NOTE — Telephone Encounter (Signed)
 PAP: Application for Airsupra has been submitted to [employer] (AZ[employer]Me), via fax

## 2024-01-08 NOTE — Telephone Encounter (Signed)
 PAP: Application for Teresa Golden has been submitted to [employer], via fax

## 2024-01-11 ENCOUNTER — Ambulatory Visit: Admitting: Psychiatry

## 2024-01-12 ENCOUNTER — Ambulatory Visit: Payer: Self-pay | Admitting: Internal Medicine

## 2024-01-12 ENCOUNTER — Encounter: Payer: Self-pay | Admitting: Internal Medicine

## 2024-01-12 VITALS — BP 122/84 | HR 59 | Temp 98.4°F | Ht 65.0 in | Wt 213.0 lb

## 2024-01-12 DIAGNOSIS — E1122 Type 2 diabetes mellitus with diabetic chronic kidney disease: Secondary | ICD-10-CM

## 2024-01-12 DIAGNOSIS — I129 Hypertensive chronic kidney disease with stage 1 through stage 4 chronic kidney disease, or unspecified chronic kidney disease: Secondary | ICD-10-CM

## 2024-01-12 DIAGNOSIS — Z794 Long term (current) use of insulin: Secondary | ICD-10-CM

## 2024-01-12 DIAGNOSIS — Z23 Encounter for immunization: Secondary | ICD-10-CM | POA: Diagnosis not present

## 2024-01-12 DIAGNOSIS — R300 Dysuria: Secondary | ICD-10-CM | POA: Diagnosis not present

## 2024-01-12 DIAGNOSIS — N1831 Chronic kidney disease, stage 3a: Secondary | ICD-10-CM | POA: Diagnosis not present

## 2024-01-12 DIAGNOSIS — Z9989 Dependence on other enabling machines and devices: Secondary | ICD-10-CM

## 2024-01-12 DIAGNOSIS — E039 Hypothyroidism, unspecified: Secondary | ICD-10-CM

## 2024-01-12 LAB — POCT URINALYSIS DIP (CLINITEK)
Bilirubin, UA: NEGATIVE
Blood, UA: NEGATIVE
Glucose, UA: 500 mg/dL — AB
Ketones, POC UA: NEGATIVE mg/dL
Leukocytes, UA: NEGATIVE
Nitrite, UA: NEGATIVE
POC PROTEIN,UA: NEGATIVE
Spec Grav, UA: 1.01 (ref 1.010–1.025)
Urobilinogen, UA: 0.2 U/dL
pH, UA: 6 (ref 5.0–8.0)

## 2024-01-12 MED ORDER — FLUCONAZOLE 150 MG PO TABS: ORAL_TABLET | ORAL | 0 refills | Status: AC

## 2024-01-12 NOTE — Assessment & Plan Note (Addendum)
 Chronic, controlled.  She will continue with metoprolol succinate XL 25mg  daily at bedtime and losartan 100mg  daily. She is encouraged to follow low sodium diet.

## 2024-01-12 NOTE — Progress Notes (Signed)
 Teresa Golden, CMA,acting as a neurosurgeon for Teresa LOISE Slocumb, MD.,have documented all relevant documentation on the behalf of Teresa LOISE Slocumb, MD,as directed by  Teresa LOISE Slocumb, MD while in the presence of Teresa LOISE Slocumb, MD.  Subjective:  [name] , female    DOB: September 21, 1945 , 78 y.o.   MRN: [id number]  Chief Complaint  Patient presents with   Diabetes    Patient presents today for a diabetes check. Patient reports compliance with her meds. Patient denies having chest pain,sob or headaches at this time. Patient reports she has been having pain on her right side. Patient reports the pain is recurrent sharp pain and it started on Sunday.    Hypertension    HPI Discussed the use of AI scribe software for clinical note transcription with the patient, who gave verbal consent to proceed.  History of Present Illness Teresa Golden is a 78 year old female with diabetes and hypertension who presents for a diabetes and blood pressure check.  She experiences frequent urinary tract infections (UTIs) and is unsure if her current symptoms are related to a UTI. She has a tingling sensation and weakness but no vaginal itching. She often has UTIs and sometimes does not receive clear signals of her onset. She practices good hygiene by wiping from front to back and using flushable wipes.  She is currently taking Jardiance  and Tresiba  for diabetes management. She takes 10 units of Tresiba  and her morning blood sugars are usually under 120 mg/dL. She is concerned about the cost of Ozempic  as the patient assistance program has ended, and she is unsure about future copays.  She had COVID-19 recently and experienced lingering bronchitis. She started taking 500 mg of vitamin C twice a day, which she feels has helped her recovery.  She has not received a flu shot this year and recalls her last flu shot was in October of the previous year. She was advised to wait 90 days after her last  COVID-19 diagnosis before receiving another booster, which would be in January.  She inquires about receiving a B complex shot, which she believes is offered at the clinic.   Diabetes She presents for her follow-up diabetic visit. She has type 2 diabetes mellitus. Pertinent negatives for diabetes include no blurred vision, no polydipsia, no polyphagia and no polyuria. There are no hypoglycemic complications. Risk factors for coronary artery disease include diabetes mellitus, dyslipidemia, hypertension, obesity, sedentary lifestyle and post-menopausal. She is following a diabetic diet. She participates in exercise intermittently. Her home blood glucose trend is fluctuating minimally. Her breakfast blood glucose is taken between 8-9 am. Her breakfast blood glucose range is generally 110-130 mg/dl. An ACE inhibitor/angiotensin II receptor blocker is being taken. Eye exam is current.  Hypertension This is a chronic problem. The current episode started more than 1 year ago. The problem has been gradually improving since onset. The problem is uncontrolled. Pertinent negatives include no blurred vision. Risk factors for coronary artery disease include diabetes mellitus, dyslipidemia, post-menopausal state and sedentary lifestyle. Past treatments include angiotensin blockers and beta blockers. The current treatment provides moderate improvement.     Past Medical History:  Diagnosis Date   Arthritis    Chronic kidney disease    self reports ckd stage 3    Depression    Diabetes mellitus, type II, insulin  dependent (HCC)    With neurologic complications. Bilateral lower extremity peripheral neuropathy   Dyspnea    with excertion;  no issues now since weight loss surgery    Endometriosis    Essential hypertension    GERD (gastroesophageal reflux disease)    Hepatitis C    C dormant; states she is in remission since taking Harvoni    Hypertensive nephropathy 07/05/2018   Hypothyroidism     Hypothyroidism 02/06/2018   Morbid obesity with BMI of 50.0-59.9, adult (HCC)    Scoliosis      Family History  Problem Relation Age of Onset   Heart attack Mother    Heart failure Mother    Stroke Father    Breast cancer Neg Hx    BRCA 1/2 Neg Hx      Current Outpatient Medications:    allopurinol  (ZYLOPRIM ) 100 MG tablet, Take 1 tablet by mouth once daily, Disp: 270 tablet, Rfl: 0   amLODipine  (NORVASC ) 5 MG tablet, Take 1 tablet by mouth once daily, Disp: 90 tablet, Rfl: 2   augmented betamethasone  dipropionate (DIPROLENE -AF) 0.05 % cream, APPLY CREAM TOPICALLY TO AFFECTED AREA TWICE DAILY, Disp: , Rfl:    AZO-CRANBERRY PO, Take 1 tablet by mouth daily., Disp: , Rfl:    benzonatate (TESSALON PERLES) 100 MG capsule, Take 1 capsule (100 mg total) by mouth 3 (three) times daily as needed., Disp: 30 capsule, Rfl: 0   cetirizine  (ZYRTEC ) 10 MG tablet, Take 1 tablet (10 mg total) by mouth daily., Disp: 30 tablet, Rfl: 1   Cholecalciferol 5000 units TABS, Take 5,000 Units by mouth daily., Disp: , Rfl:    diclofenac  Sodium (VOLTAREN ) 1 % GEL, APPLY 2 GRAMS TO AFFECTED AREA(S) 4 TIMES DAILY AS NEEDED, Disp: 200 g, Rfl: 2   empagliflozin  (JARDIANCE ) 25 MG TABS tablet, Take 1 tablet (25 mg total) by mouth daily before breakfast., Disp: , Rfl:    gabapentin  (NEURONTIN ) 300 MG capsule, Take 2 capsules (600 mg total) by mouth at bedtime., Disp: 180 capsule, Rfl: 0   glucose blood (ONETOUCH VERIO) test strip, USE TO CHECK BLOOD SUGAR   TWO TIMES A DAY AS         INSTRUCTED, Disp: 200 strip, Rfl: 3   Insulin  Degludec (TRESIBA ) 100 UNIT/ML SOLN, Inject 10 Units/oz/day into the skin at bedtime. Patient is taking at bedtime, Disp: , Rfl:    Lancets (ONETOUCH DELICA PLUS LANCET33G) MISC, USE TO CHECK BLOOD SUGARS  TWO TIMES A DAY AS         INSTRUCTED, Disp: 200 each, Rfl: 3   levothyroxine  (SYNTHROID ) 25 MCG tablet, Take 1/2 tablet daily., Disp: 90 tablet, Rfl: 1   losartan  (COZAAR ) 100 MG tablet, TAKE  1 TABLET DAILY, Disp: 90 tablet, Rfl: 2   metoprolol  succinate (TOPROL -XL) 25 MG 24 hr tablet, TAKE 1 TABLET AT BEDTIME   (DISCONTINUE COREG ), Disp: 90 tablet, Rfl: 1   Multiple Vitamins-Minerals (MULTIVITAMIN WITH MINERALS) tablet, Take 1 tablet by mouth daily. Women's 50+, Disp: , Rfl:    pantoprazole  (PROTONIX ) 40 MG tablet, Take 40 mg by mouth daily as needed., Disp: , Rfl:    Semaglutide , 1 MG/DOSE, (OZEMPIC , 1 MG/DOSE,) 4 MG/3ML SOPN, Inject 1 mg into the skin once a week., Disp: 9 mL, Rfl: 1   simvastatin  (ZOCOR ) 10 MG tablet, Take 1 tablet by mouth once daily, Disp: 90 tablet, Rfl: 2   triamcinolone ointment (KENALOG) 0.1 %, Apply 1 Application topically 2 (two) times daily., Disp: , Rfl:    Albuterol -Budesonide (AIRSUPRA) 90-80 MCG/ACT AERO, Inhale 2 puffs into the lungs every 6 (six) hours as needed. (Patient not  taking: Reported on 01/12/2024), Disp: 32.1 g, Rfl: 3   cephALEXin  (KEFLEX ) 500 MG capsule, Take 1 capsule (500 mg total) by mouth 3 (three) times daily. (Patient not taking: Reported on 01/12/2024), Disp: 15 capsule, Rfl: 0   Allergies  Allergen Reactions   Meloxicam Other (See Comments)    Fever; muscle aches; flu-like symptoms Allergic, per MAR   Grass Pollen(K-O-R-T-Swt Vern) Other (See Comments)    Sinus inflammation    Other Other (See Comments)    Rose fever and hay fever     Review of Systems  Constitutional: Negative.   Eyes: Negative.  Negative for blurred vision.  Respiratory: Negative.    Cardiovascular: Negative.   Gastrointestinal: Negative.   Endocrine: Negative for polydipsia, polyphagia and polyuria.  Genitourinary:  Positive for dysuria.  Musculoskeletal: Negative.   Skin: Negative.   Psychiatric/Behavioral: Negative.       Today's Vitals   01/12/24 1026  BP: 122/84  Pulse: (!) 59  Temp: 98.4 F (36.9 C)  TempSrc: Oral  Weight: 213 lb (96.6 kg)  Height: 5' 5 (1.651 m)  PainLoc: Abdomen   Body mass index is 35.45 kg/m.  Wt  Readings from Last 3 Encounters:  01/12/24 213 lb (96.6 kg)  12/11/23 213 lb (96.6 kg)  12/08/23 213 lb (96.6 kg)    The 10-year ASCVD risk score (Arnett DK, et al., 2019) is: 36.8%   Values used to calculate the score:     Age: 40 years     Clincally relevant sex: Female     Is Non-Hispanic African American: Yes     Diabetic: Yes     Tobacco smoker: No     Systolic Blood Pressure: 122 mmHg     Is BP treated: Yes     HDL Cholesterol: 70 mg/dL     Total Cholesterol: 170 mg/dL  Objective:  Physical Exam Vitals and nursing note reviewed.  Constitutional:      Appearance: Normal appearance. She is obese.  HENT:     Head: Normocephalic and atraumatic.  Eyes:     Extraocular Movements: Extraocular movements intact.  Cardiovascular:     Rate and Rhythm: Normal rate and regular rhythm.     Heart sounds: Normal heart sounds.  Pulmonary:     Effort: Pulmonary effort is normal.     Breath sounds: Normal breath sounds.  Musculoskeletal:     Cervical back: Normal range of motion.     Comments: Ambulatory with walker  Skin:    General: Skin is warm.  Neurological:     General: No focal deficit present.     Mental Status: She is alert.  Psychiatric:        Mood and Affect: Mood normal.        Behavior: Behavior normal.         Assessment And Plan:   Assessment & Plan Type 2 diabetes mellitus with stage 3a chronic kidney disease, with long-term current use of insulin  (HCC) Managed with Jardiance  25 mg and Ozempic . Improved bladder symptoms compared to Farxiga . However, she is still having frequent UTIs.   - Hold Jardiance  remainder of the week. Decrease to 25mg  MWF dosing next week until she runs out.  - After that, consider 10mg  Jardiance  daily M-F along with Ozempic  weekly.  - Obtain blood sample for kidney function. - Schedule follow-up in three months for diabetes management and A1c evaluation. Hypertensive nephropathy Chronic, controlled.   She will continue with  metoprolol  succinate XL 25mg  daily at  bedtime and losartan  100mg  daily. She is encouraged to follow low sodium diet.  Dysuria Recurrent. Has h/o frequent UTI. Likely related to SGLT2 use. Will check u/a today and check culture if needed.  Primary hypothyroidism Chronic, currently on Synthroid  25mcg 1/2 tab daily.  - Check thyroid  labs and adjust as needed.  Obesity, morbid (HCC) BMI 35, also with HTN and DM. She is encouraged to initially strive for BMI less than 30 to decrease cardiac risk. Advised to aim for at least 150 minutes of exercise per week.  Need for influenza vaccination  Walker as ambulation aid   Orders Placed This Encounter  Procedures   Hemoglobin A1c   TSH   POCT URINALYSIS DIP (CLINITEK)   Return in 3 months (on 04/13/2024), or dm check.  Patient was given opportunity to ask questions. Patient verbalized understanding of the plan and was able to repeat key elements of the plan. All questions were answered to their satisfaction.   I, Teresa LOISE Slocumb, MD, have reviewed all documentation for this visit. The documentation on 01/12/24 for the exam, diagnosis, procedures, and orders are all accurate and complete.   IF YOU HAVE BEEN REFERRED TO A SPECIALIST, IT MAY TAKE 1-2 WEEKS TO SCHEDULE/PROCESS THE REFERRAL. IF YOU HAVE NOT HEARD FROM US /SPECIALIST IN TWO WEEKS, PLEASE GIVE US  A CALL AT [phone] X [phone].

## 2024-01-12 NOTE — Patient Instructions (Signed)

## 2024-01-12 NOTE — Assessment & Plan Note (Addendum)
 Chronic, currently on Synthroid  25mcg 1/2 tab daily.  - Check thyroid  labs and adjust as needed.

## 2024-01-12 NOTE — Assessment & Plan Note (Addendum)
 Managed with Jardiance  25 mg and Ozempic . Improved bladder symptoms compared to Farxiga . However, she is still having frequent UTIs.   - Hold Jardiance  remainder of the week. Decrease to 25mg  MWF dosing next week until she runs out.  - After that, consider 10mg  Jardiance  daily M-F along with Ozempic  weekly.  - Obtain blood sample for kidney function. - Schedule follow-up in three months for diabetes management and A1c evaluation.

## 2024-01-12 NOTE — Telephone Encounter (Signed)
 PAP: Patient assistance application for Tresiba  has been approved by PAP Companies: [employer] from 02/25/2024 to 02/23/2025. Medication should be delivered to PAP Delivery: Provider's office. For further shipping updates, please contact [employer] at [phone]. Patient ID is: [id number]

## 2024-01-12 NOTE — Assessment & Plan Note (Signed)
 BMI 35, also with HTN and DM. She is encouraged to initially strive for BMI less than 30 to decrease cardiac risk. Advised to aim for at least 150 minutes of exercise per week.

## 2024-01-13 ENCOUNTER — Ambulatory Visit: Payer: Self-pay | Admitting: Internal Medicine

## 2024-01-13 LAB — HEMOGLOBIN A1C
Est. average glucose Bld gHb Est-mCnc: 143 mg/dL
Hgb A1c MFr Bld: 6.6 % — ABNORMAL HIGH (ref 4.8–5.6)

## 2024-01-13 LAB — TSH: TSH: 3.08 u[IU]/mL (ref 0.450–4.500)

## 2024-01-13 NOTE — Telephone Encounter (Signed)
 PAP: Patient assistance application for Airsupra  has been approved by PAP Companies: [employer] from 01/13/2024 to 02/23/2025. Medication should be delivered to PAP Delivery: Home. For further shipping updates, please contact [employer] ([employer]) at [phone]. Patient ID is: [id number]

## 2024-01-14 ENCOUNTER — Other Ambulatory Visit: Payer: Self-pay

## 2024-01-14 ENCOUNTER — Telehealth: Payer: Self-pay | Admitting: Pharmacist

## 2024-01-14 DIAGNOSIS — Z794 Long term (current) use of insulin: Secondary | ICD-10-CM

## 2024-01-14 NOTE — Progress Notes (Signed)
   01/14/2024  [name], female   DOB: 17-Jan-1946, 78 y.o.   MRN: [id number]  Patient was called regarding an urgent message from Teresa Ly, RN stating the Patient said a representative from [employer] called her and said they did not have her financial documentation.  Teresa Golden Golden her paperwork into the office on 01/06/24.  Her documents were scanned to email to both myself and Teresa Golden, CPhT.  Teresa said she did not receive the documents.  They were resent today and will be sent to the Program.   Teresa Golden, PharmD, [facility] Clinical Pharmacist [phone]

## 2024-01-15 NOTE — Patient Outreach (Signed)
 Complex Care Management   Visit Note  01/14/2024  Name:  Teresa Golden MRN: [id number] DOB: 09/11/1945  Situation: Referral received for Complex Care Management related to Diabetes with Complications and Hypertension, Polyneuropathy, CKD, s/p COVID 19 with persistent cough, reoccurring UTI. I obtained verbal consent from Patient.  Visit completed with Patient on the phone.  Background:   Past Medical History:  Diagnosis Date   Arthritis    Chronic kidney disease    self reports ckd stage 3    Depression    Diabetes mellitus, type II, insulin  dependent (HCC)    With neurologic complications. 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    Hypertensive nephropathy 07/05/2018   Hypothyroidism    Hypothyroidism 02/06/2018   Morbid obesity with BMI of 50.0-59.9, adult (HCC)    Scoliosis     Assessment: Patient Reported Symptoms:  Cognitive Cognitive Status: Alert and oriented to person, place, and time, Normal speech and language skills Cognitive/Intellectual Conditions Management [RPT]: None reported or documented in medical history or problem list   Health Maintenance Behaviors: Annual physical exam, Immunizations Health Facilitated by: Rest, Healthy diet  Neurological Neurological Review of Symptoms: Not assessed    HEENT HEENT Symptoms Reported: Nasal discharge HEENT Management Strategies: Routine screening, Medication therapy, Adequate rest HEENT Self-Management Outcome: 3 (uncertain)    Cardiovascular Cardiovascular Symptoms Reported: No symptoms reported Does patient have uncontrolled Hypertension?: No Cardiovascular Management Strategies: Medication therapy, Adequate rest, Routine screening Cardiovascular Self-Management Outcome: 4  (good)  Respiratory Respiratory Symptoms Reported: No symptoms reported    Endocrine Endocrine Symptoms Reported: No symptoms reported Is patient diabetic?: Yes Is patient checking blood sugars at home?: Yes Endocrine Self-Management Outcome: 4 (good)  Gastrointestinal Gastrointestinal Symptoms Reported: No symptoms reported      Genitourinary Genitourinary Symptoms Reported: Frequency Genitourinary Management Strategies: Fluid modification Genitourinary Self-Management Outcome: 4 (good)  Integumentary Integumentary Symptoms Reported: No symptoms reported    Musculoskeletal Musculoskelatal Symptoms Reviewed: Not assessed        Psychosocial Psychosocial Symptoms Reported: No symptoms reported   Major Change/Loss/Stressor/Fears (CP): Denies Quality of Family Relationships: supportive Do you feel physically threatened by others?: No    01/15/2024    PHQ2-9 Depression Screening   Ladarious Kresse interest or pleasure in doing things    Feeling down, depressed, or hopeless    PHQ-2 - Total Score    Trouble falling or staying asleep, or sleeping too much    Feeling tired or having Chalmers Iddings energy    Poor appetite or overeating     Feeling bad about yourself - or that you are a failure or have let yourself or your family down    Trouble concentrating on things, such as reading the newspaper or watching television    Moving or speaking so slowly that other people could have noticed.  Or the opposite - being so fidgety or restless that you have been moving around a lot more than usual    Thoughts that you would be better off dead, or hurting yourself in some way    PHQ2-9 Total Score    If you checked off any problems, how difficult have these problems made it for you to do your work, take care of things  at home, or get along with other people    Depression Interventions/Treatment      There were no vitals filed for this visit. Pain Scale: Not given for pain  Medications Reviewed Today      Reviewed by Morgan Clayborne CROME, RN (Registered Nurse) on 01/14/24 at 1053  Med List Status: <None>   Medication Order Taking? Sig Documenting Provider Last Dose Status Informant  allopurinol  (ZYLOPRIM ) 100 MG tablet [id number]  Take 1 tablet by mouth once daily Petrina Pries, NP  Active   amLODipine  (NORVASC ) 5 MG tablet [id number]  Take 1 tablet by mouth once daily Jarold Medici, MD  Active   augmented betamethasone  dipropionate (DIPROLENE -AF) 0.05 % cream [id number]  APPLY CREAM TOPICALLY TO AFFECTED AREA TWICE DAILY [provider]  Active   AZO-CRANBERRY PO [id number]  Take 1 tablet by mouth daily. [provider]  Active Self  benzonatate  (TESSALON  PERLES) 100 MG capsule [id number]  Take 1 capsule (100 mg total) by mouth 3 (three) times daily as needed. Petrina Pries, NP  Active   cetirizine  (ZYRTEC ) 10 MG tablet [id number]  Take 1 tablet (10 mg total) by mouth daily. Petrina Pries, NP  Active   Cholecalciferol 5000 units TABS [id number]  Take 5,000 Units by mouth daily. [provider]  Active Nursing Home Medication Administration Guide (MAG)           Med Note ALLEGRA, [place] I   Fri Aug 07, 2017  6:51 PM)    diclofenac  Sodium (VOLTAREN ) 1 % GEL [id number]  APPLY 2 GRAMS TO AFFECTED AREA(S) 4 TIMES DAILY AS NEEDED Jarold Medici, MD  Active   empagliflozin  (JARDIANCE ) 25 MG TABS tablet [id number]  Take 1 tablet (25 mg total) by mouth daily before breakfast. Jarold Medici, MD  Active   fluconazole  (DIFLUCAN ) 150 MG tablet [id number]  Take one tablet by mouth once daily  today and repeat in 2 days Jarold Medici, MD  Active   gabapentin  (NEURONTIN ) 300 MG capsule [id number]  Take 2 capsules (600 mg total) by mouth at bedtime. Tobie Franky SQUIBB, DPM  Active   glucose blood [facility] VERIO) test strip [id number]  USE TO CHECK BLOOD SUGAR   TWO TIMES A DAY AS         INSTRUCTED Jarold Medici, MD  Active   Insulin  Degludec (TRESIBA ) 100 UNIT/ML SOLN [id number]  Inject 10  Units/oz/day into the skin at bedtime. Patient is taking at bedtime [provider]  Active   Lancets [facility] [place] PLUS [place]) MISC [id number]  USE TO CHECK BLOOD SUGARS  TWO TIMES A DAY AS         INSTRUCTED Jarold Medici, MD  Active   levothyroxine  (SYNTHROID ) 25 MCG tablet [id number]  Take 1/2 tablet daily. Jarold Medici, MD  Active   losartan  (COZAAR ) 100 MG tablet [id number]  TAKE 1 TABLET DAILY Jarold Medici, MD  Active   metoprolol  succinate (TOPROL -XL) 25 MG 24 hr tablet [id number]  TAKE 1 TABLET AT BEDTIME   (DISCONTINUE COREG ) Jarold Medici, MD  Active   Multiple Vitamins-Minerals (MULTIVITAMIN WITH MINERALS) tablet [id number]  Take 1 tablet by mouth daily. Women's 50+ [provider]  Active Nursing Home Medication Administration Guide (MAG)           Med Note ALLEGRA, [place] I   Fri Aug 07, 2017  6:53 PM)    pantoprazole  (PROTONIX ) 40 MG tablet [id number]  Take 40 mg by mouth daily as needed. [provider]  Active   Semaglutide ,  1 MG/DOSE, (OZEMPIC , 1 MG/DOSE,) 4 MG/3ML SOPN [id number]  Inject 1 mg into the skin once a week. Jarold Medici, MD  Active   Patient not taking:  Discontinued 11/06/20 1600 (Reorder) simvastatin  (ZOCOR ) 10 MG tablet [id number]  Take 1 tablet by mouth once daily Jarold Medici, MD  Active   triamcinolone  ointment (KENALOG ) 0.1 % [id number]  Apply 1 Application topically 2 (two) times daily. [provider]  Active   Med List Note Marisa Nathanel SAILOR, CPhT 11/02/20 2136): Admitted to [facility] on 10/22/2020 Larue SAILOR) [place], [place]            Recommendation:   Continue Current Plan of Care  Follow Up Plan:   Telephone follow up appointment date/time:     02/04/2024 Status: Sch   Time: 10:30 AM Length: 30  Visit Type: VBCI TELEPHONE CALL 30 [2502] Copay: $0.00  Provider: Morgan Clayborne CROME, RN Department: [facility]    Clayborne Morgan RN BSN CCM [place]  [facility], [facility]  Health Nurse Care Coordinator  Direct Dial: [phone] Website: [email or web address]

## 2024-01-15 NOTE — Patient Instructions (Signed)
 Visit Information  Thank you for taking time to visit with me today. Please don't hesitate to contact me if I can be of assistance to you before our next scheduled appointment.  Your next care management appointment is by telephone on Thursday, December 11 at 10:30 AM  Please call the care guide team at [phone] if you need to cancel, schedule, or reschedule an appointment.   Please call [phone]-TALK (toll free, 24 hour hotline) if you are experiencing a Mental Health or Behavioral Health   Crisis or need someone to talk to. Clayborne Ly RN BSN CCM [place]  [facility], [facility] Health Nurse Care Coordinator  Direct Dial: [phone] Website: [email or web address]

## 2024-01-16 ENCOUNTER — Other Ambulatory Visit: Payer: Self-pay | Admitting: Internal Medicine

## 2024-01-18 ENCOUNTER — Other Ambulatory Visit: Payer: Self-pay | Admitting: Internal Medicine

## 2024-01-18 ENCOUNTER — Emergency Department (HOSPITAL_COMMUNITY)

## 2024-01-18 ENCOUNTER — Encounter (HOSPITAL_COMMUNITY): Payer: Self-pay

## 2024-01-18 ENCOUNTER — Ambulatory Visit: Admitting: Psychiatry

## 2024-01-18 ENCOUNTER — Other Ambulatory Visit: Payer: Self-pay

## 2024-01-18 ENCOUNTER — Emergency Department (HOSPITAL_COMMUNITY)
Admission: EM | Admit: 2024-01-18 | Discharge: 2024-01-19 | Disposition: A | Discharge status: Home / Self Care | Attending: Emergency Medicine | Admitting: Emergency Medicine

## 2024-01-18 DIAGNOSIS — R112 Nausea with vomiting, unspecified: Secondary | ICD-10-CM | POA: Insufficient documentation

## 2024-01-18 DIAGNOSIS — Z794 Long term (current) use of insulin: Secondary | ICD-10-CM | POA: Diagnosis not present

## 2024-01-18 DIAGNOSIS — E039 Hypothyroidism, unspecified: Secondary | ICD-10-CM | POA: Insufficient documentation

## 2024-01-18 DIAGNOSIS — I700 Atherosclerosis of aorta: Secondary | ICD-10-CM | POA: Diagnosis not present

## 2024-01-18 DIAGNOSIS — Z79890 Hormone replacement therapy (postmenopausal): Secondary | ICD-10-CM | POA: Diagnosis not present

## 2024-01-18 DIAGNOSIS — I129 Hypertensive chronic kidney disease with stage 1 through stage 4 chronic kidney disease, or unspecified chronic kidney disease: Secondary | ICD-10-CM | POA: Diagnosis not present

## 2024-01-18 DIAGNOSIS — Z87440 Personal history of urinary (tract) infections: Secondary | ICD-10-CM | POA: Diagnosis present

## 2024-01-18 DIAGNOSIS — E1122 Type 2 diabetes mellitus with diabetic chronic kidney disease: Secondary | ICD-10-CM | POA: Diagnosis not present

## 2024-01-18 DIAGNOSIS — R6883 Chills (without fever): Secondary | ICD-10-CM | POA: Insufficient documentation

## 2024-01-18 DIAGNOSIS — N189 Chronic kidney disease, unspecified: Secondary | ICD-10-CM | POA: Diagnosis not present

## 2024-01-18 DIAGNOSIS — R7989 Other specified abnormal findings of blood chemistry: Secondary | ICD-10-CM | POA: Insufficient documentation

## 2024-01-18 DIAGNOSIS — F32A Depression, unspecified: Secondary | ICD-10-CM | POA: Diagnosis not present

## 2024-01-18 DIAGNOSIS — R0602 Shortness of breath: Secondary | ICD-10-CM | POA: Diagnosis present

## 2024-01-18 DIAGNOSIS — Z7985 Long-term (current) use of injectable non-insulin antidiabetic drugs: Secondary | ICD-10-CM | POA: Insufficient documentation

## 2024-01-18 DIAGNOSIS — K769 Liver disease, unspecified: Secondary | ICD-10-CM

## 2024-01-18 DIAGNOSIS — R10A1 Flank pain, right side: Secondary | ICD-10-CM | POA: Insufficient documentation

## 2024-01-18 DIAGNOSIS — Z79899 Other long term (current) drug therapy: Secondary | ICD-10-CM | POA: Insufficient documentation

## 2024-01-18 LAB — COMPREHENSIVE METABOLIC PANEL WITH GFR
ALT: 21 U/L (ref 0–44)
AST: 39 U/L (ref 15–41)
Albumin: 3.9 g/dL (ref 3.5–5.0)
Alkaline Phosphatase: 86 U/L (ref 38–126)
Anion gap: 12 (ref 5–15)
BUN: 19 mg/dL (ref 8–23)
CO2: 22 mmol/L (ref 22–32)
Calcium: 9.4 mg/dL (ref 8.9–10.3)
Chloride: 108 mmol/L (ref 98–111)
Creatinine, Ser: 1.4 mg/dL — ABNORMAL HIGH (ref 0.44–1.00)
GFR, Estimated: 38 mL/min — ABNORMAL LOW (ref >60)
Glucose, Bld: 147 mg/dL — ABNORMAL HIGH (ref 70–99)
Potassium: 4 mmol/L (ref 3.5–5.1)
Sodium: 142 mmol/L (ref 135–145)
Total Bilirubin: 0.3 mg/dL (ref 0.0–1.2)
Total Protein: 7.4 g/dL (ref 6.5–8.1)

## 2024-01-18 LAB — URINALYSIS, ROUTINE W REFLEX MICROSCOPIC
Bilirubin Urine: NEGATIVE
Glucose, UA: >=500 mg/dL — AB
Hgb urine dipstick: NEGATIVE
Ketones, ur: NEGATIVE mg/dL
Leukocytes,Ua: NEGATIVE
Nitrite: NEGATIVE
Protein, ur: 30 mg/dL — AB
Specific Gravity, Urine: 1.008 (ref 1.005–1.030)
pH: 6 (ref 5.0–8.0)

## 2024-01-18 LAB — CBC WITH DIFFERENTIAL/PLATELET
Abs Immature Granulocytes: 0.04 K/uL (ref 0.00–0.07)
Basophils Absolute: 0.1 K/uL (ref 0.0–0.1)
Basophils Relative: 1 %
Eosinophils Absolute: 0.3 K/uL (ref 0.0–0.5)
Eosinophils Relative: 3 %
HCT: 37.2 % (ref 36.0–46.0)
Hemoglobin: 12.6 g/dL (ref 12.0–15.0)
Immature Granulocytes: 0 %
Lymphocytes Relative: 17 %
Lymphs Abs: 1.7 K/uL (ref 0.7–4.0)
MCH: 28.8 pg (ref 26.0–34.0)
MCHC: 33.9 g/dL (ref 30.0–36.0)
MCV: 84.9 fL (ref 80.0–100.0)
Monocytes Absolute: 0.9 K/uL (ref 0.1–1.0)
Monocytes Relative: 9 %
Neutro Abs: 7 K/uL (ref 1.7–7.7)
Neutrophils Relative %: 70 %
Platelets: 195 K/uL (ref 150–400)
RBC: 4.38 MIL/uL (ref 3.87–5.11)
RDW: 14.6 % (ref 11.5–15.5)
WBC: 10 K/uL (ref 4.0–10.5)
nRBC: 0 % (ref 0.0–0.2)

## 2024-01-18 LAB — RESP PANEL BY RT-PCR (RSV, FLU A&B, COVID)  RVPGX2
Influenza A by PCR: NEGATIVE
Influenza B by PCR: NEGATIVE
Resp Syncytial Virus by PCR: NEGATIVE
SARS Coronavirus 2 by RT PCR: NEGATIVE

## 2024-01-18 LAB — LIPASE, BLOOD: Lipase: 53 U/L — ABNORMAL HIGH (ref 11–51)

## 2024-01-18 NOTE — ED Provider Notes (Signed)
 [place] EMERGENCY DEPARTMENT AT [facility] Provider Note   CSN: [id number] Arrival date & time: 01/18/24  2114     Patient presents with: Vomiting, Abdominal Pain, and Shortness of Breath (BIBA from home w/ c/o vomiting x one with no c/o nausea.  C/o abdominal pain x 2 weeks, saw PCP last Tuesday and directed to stop use of Jardiance , also c/o shortness of breath since 1700 today.  FSBS 110 en route, 20g L AC placed PTA)   [name] L Heckstall is a 78 y.o. female.  {Add pertinent medical, surgical, social history, OB history to HPI:8837} 78 year old female presents with complaint of cough and congestion, chills, nausea. Patient reports onset of cough (occasionally productive) and congestion 4 days ago. 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 Patient called 911 and checked her blood sugar, CBG 91. Transported to the ER. States she has had frequent UTIs, recently seen at PCP and with right side abdominal/flank pain, did not have a UTI, was given Diflucan  for a yeast infection, told to take her Jardiance  on M/W/F only to help decrease frequency of UTIs.        Prior to Admission medications   Medication Sig Start Date End Date Taking? Authorizing Provider  allopurinol  (ZYLOPRIM ) 100 MG tablet Take 1 tablet by mouth once daily 07/29/23   Petrina Pries, NP  amLODipine  (NORVASC ) 5 MG tablet Take 1 tablet by mouth once daily 07/21/23   Jarold Medici, MD  augmented betamethasone  dipropionate (DIPROLENE -AF) 0.05 % cream APPLY CREAM TOPICALLY TO AFFECTED AREA TWICE DAILY    [provider]  AZO-CRANBERRY PO Take 1 tablet by mouth daily.    [provider]  benzonatate  (TESSALON  PERLES) 100 MG capsule Take 1 capsule (100 mg total) by mouth 3 (three) times daily as needed. 12/11/23 12/10/24  Petrina Pries, NP  cetirizine  (ZYRTEC ) 10 MG tablet Take 1 tablet (10 mg total) by mouth daily. 10/29/22   Petrina Pries, NP  Cholecalciferol 5000  units TABS Take 5,000 Units by mouth daily.    [provider]  diclofenac  Sodium (VOLTAREN ) 1 % GEL APPLY 2 GRAMS TO AFFECTED AREA(S) 4 TIMES DAILY AS NEEDED 02/28/19   Jarold Medici, MD  empagliflozin  (JARDIANCE ) 25 MG TABS tablet Take 1 tablet (25 mg total) by mouth daily before breakfast. 10/12/23   Jarold Medici, MD  fluconazole  (DIFLUCAN ) 150 MG tablet Take one tablet by mouth once daily  today and repeat in 2 days 01/12/24   Jarold Medici, MD  gabapentin  (NEURONTIN ) 300 MG capsule Take 2 capsules (600 mg total) by mouth at bedtime. 10/23/23   Tobie Franky SQUIBB, DPM  glucose blood (ONETOUCH VERIO) test strip USE TO CHECK BLOOD SUGAR   TWO TIMES A DAY AS         INSTRUCTED 09/07/23   Jarold Medici, MD  Insulin  Degludec (TRESIBA ) 100 UNIT/ML SOLN Inject 10 Units/oz/day into the skin at bedtime. Patient is taking at bedtime    [provider]  Lancets [facility] DELICA PLUS LANCET33G) MISC USE TO CHECK BLOOD SUGARS  TWO TIMES A DAY AS         INSTRUCTED 01/24/21   Jarold Medici, MD  levothyroxine  (SYNTHROID ) 25 MCG tablet Take 1/2 (one-half) tablet by mouth once daily 01/18/24   Jarold Medici, MD  losartan  (COZAAR ) 100 MG tablet TAKE 1 TABLET DAILY 04/28/23   Jarold Medici, MD  metoprolol  succinate (TOPROL -XL) 25 MG 24 hr tablet TAKE 1 TABLET  AT BEDTIME   (DISCONTINUE COREG ) 12/14/23   Jarold Medici, MD  Multiple Vitamins-Minerals (MULTIVITAMIN WITH MINERALS) tablet Take 1 tablet by mouth daily. Women's 50+    [provider]  pantoprazole  (PROTONIX ) 40 MG tablet Take 40 mg by mouth daily as needed. 09/15/21   [provider]  Semaglutide , 1 MG/DOSE, (OZEMPIC , 1 MG/DOSE,) 4 MG/3ML SOPN Inject 1 mg into the skin once a week. 04/24/21   Jarold Medici, MD  simvastatin  (ZOCOR ) 10 MG tablet Take 1 tablet by mouth once daily 01/18/24   Jarold Medici, MD  triamcinolone  ointment (KENALOG ) 0.1 % Apply 1 Application topically 2 (two) times daily. 11/25/22   [provider]  SEMGLEE , YFGN, 100 UNIT/ML Pen Inject 9 Units into the skin at bedtime. Patient not taking: Reported on 05/28/2023  11/06/20  [provider]    Allergies: Meloxicam, Grass pollen(k-o-r-t-swt vern), and Other    Review of Systems Negative except as per HPI Updated Vital Signs BP (!) 173/79   Pulse 95   Temp (!) 97.1 F (36.2 C) (Oral)   Resp 14   Ht 5' 4.96 (1.65 m)   Wt 96.6 kg   SpO2 98%   BMI 35.48 kg/m   Physical Exam Vitals and nursing note reviewed.  Constitutional:      General: She is not in acute distress.    Appearance: She is well-developed. She is not diaphoretic.  HENT:     Head: Normocephalic and atraumatic.     Nose: Congestion present.     Mouth/Throat:     Mouth: Mucous membranes are moist.  Cardiovascular:     Rate and Rhythm: Normal rate and regular rhythm.     Pulses: Normal pulses.     Heart sounds: Normal heart sounds.  Pulmonary:     Effort: Pulmonary effort is normal.     Breath sounds: Rhonchi present.  Abdominal:     Palpations: Abdomen is soft.     Tenderness: There is no abdominal tenderness. There is no right CVA tenderness.  Musculoskeletal:     Cervical back: Neck supple.     Right lower leg: No edema.     Left lower leg: No edema.  Skin:    General: Skin is warm and dry.     Findings: No erythema or rash.  Neurological:     Mental Status: She is alert and oriented to person, place, and time.  Psychiatric:        Behavior: Behavior normal.     (all labs ordered are listed, but only abnormal results are displayed) Labs Reviewed  COMPREHENSIVE METABOLIC PANEL WITH GFR - Abnormal; Notable for the following components:      Result Value   Glucose, Bld 147 (*)    Creatinine, Ser 1.40 (*)    GFR, Estimated 38 (*)    All other components within normal limits  LIPASE, BLOOD - Abnormal; Notable for the following components:   Lipase 53 (*)    All other components within normal limits  URINALYSIS, ROUTINE W REFLEX  MICROSCOPIC - Abnormal; Notable for the following components:   Color, Urine STRAW (*)    Glucose, UA >=500 (*)    Protein, ur 30 (*)    Bacteria, UA RARE (*)    All other components within normal limits  RESP PANEL BY RT-PCR (RSV, FLU A&B, COVID)  RVPGX2  CBC WITH DIFFERENTIAL/PLATELET    EKG: None  Radiology: No results found.  {Document cardiac monitor, telemetry assessment procedure when  appropriate:32947} Procedures   Medications Ordered in the ED - No data to display    {Click here for ABCD2, HEART and other calculators REFRESH Note before signing:1}                              Medical Decision Making Amount and/or Complexity of Data Reviewed Labs: ordered. Radiology: ordered.   This patient presents to the ED for concern of ***, this involves an extensive number of treatment options, and is a complaint that carries with it a high risk of complications and morbidity.  The differential diagnosis includes ***   Co morbidities / Chronic conditions that complicate the patient evaluation  ***   Additional history obtained:  Additional history obtained from EMR External records from outside source obtained and reviewed including ***   Lab Tests:  I Ordered, and personally interpreted labs.  The pertinent results include:  ***   Imaging Studies ordered:  I ordered imaging studies including ***  I independently visualized and interpreted imaging which showed *** I agree with the radiologist interpretation   Cardiac Monitoring: / EKG:  The patient was maintained on a cardiac monitor.  I personally viewed and interpreted the cardiac monitored which showed an underlying rhythm of: ***   Problem List / ED Course / Critical interventions / Medication management  *** I ordered medication including ***   Reevaluation of the patient after these medicines showed that the patient *** I have reviewed the patients home medicines and have made adjustments as  needed   Consultations Obtained:  I requested consultation with the ***,  and discussed lab and imaging findings as well as pertinent plan - they recommend: ***   Social Determinants of Health:  ***   Test / Admission - Considered:  ***   {Document critical care time when appropriate  Document review of labs and clinical decision tools ie CHADS2VASC2, etc  Document your independent review of radiology images and any outside records  Document your discussion with family members, caretakers and with consultants  Document social determinants of health affecting pt's care  Document your decision making why or why not admission, treatments were needed:32947:::1}   Final diagnoses:  None    ED Discharge Orders     None

## 2024-01-18 NOTE — ED Triage Notes (Signed)
 BIBA from home w/ c/o vomiting x one with no c/o nausea.  C/o abdominal pain x 2 weeks, saw PCP last Tuesday and directed to stop use of Jardiance , also c/o shortness of breath since 1700 today.  FSBS 110 en route, 20g L AC placed PTA.  Pt used restroom during triage process. Urine specimen collected and placed at bedside

## 2024-01-19 ENCOUNTER — Emergency Department (HOSPITAL_COMMUNITY)

## 2024-01-19 MED ORDER — SODIUM CHLORIDE 0.9 % IV BOLUS
500.0000 mL | Freq: Once | INTRAVENOUS | Status: AC
  Administered 2024-01-19: 500 mL via INTRAVENOUS

## 2024-01-19 MED ORDER — ONDANSETRON 4 MG PO TBDP: 4.0000 mg | ORAL_TABLET | ORAL | Status: DC | PRN

## 2024-01-19 MED ORDER — ONDANSETRON HCL 4 MG/2ML IJ SOLN
4.0000 mg | Freq: Once | INTRAMUSCULAR | Status: AC
  Administered 2024-01-19: 4 mg via INTRAVENOUS
  Filled 2024-01-19: qty 2

## 2024-01-19 NOTE — Discharge Instructions (Signed)
 Follow up with your primary care provider. Return to the ER for worsening or concerning symptoms. Monitor your blood sugar.

## 2024-01-19 NOTE — ED Notes (Signed)
 PT axox4. GCS 15. Pt verbalizes understanding of discharge instructions and follow up. Pt escorted to restroom by primary rn and rolater prior to leaving with PTAR. PT escorted out via stretcher to transportation home via [facility]

## 2024-01-19 NOTE — ED Notes (Signed)
 Notified MD that patient failed x2 PO challenge attempts. Directed RN to try saltines for patient.  PT given saltine crackers.

## 2024-01-20 NOTE — Telephone Encounter (Signed)
 PAP: Patient assistance application for Jardiance  has been approved by PAP Companies: [employer] from 02/25/2024 to 02/23/2025. Medication should be delivered to PAP Delivery: Home. For further shipping updates, please contact [employer]) at [phone]. Patient ID is: [id number]

## 2024-01-25 ENCOUNTER — Ambulatory Visit: Admitting: Internal Medicine

## 2024-01-25 ENCOUNTER — Encounter: Payer: Self-pay | Admitting: Internal Medicine

## 2024-01-25 VITALS — BP 132/82 | HR 74 | Temp 98.3°F | Ht 64.0 in | Wt 213.8 lb

## 2024-01-25 DIAGNOSIS — R112 Nausea with vomiting, unspecified: Secondary | ICD-10-CM | POA: Insufficient documentation

## 2024-01-25 DIAGNOSIS — M8080XD Other osteoporosis with current pathological fracture, unspecified site, subsequent encounter for fracture with routine healing: Secondary | ICD-10-CM

## 2024-01-25 DIAGNOSIS — S62102S Fracture of unspecified carpal bone, left wrist, sequela: Secondary | ICD-10-CM | POA: Diagnosis not present

## 2024-01-25 DIAGNOSIS — W010XXD Fall on same level from slipping, tripping and stumbling without subsequent striking against object, subsequent encounter: Secondary | ICD-10-CM

## 2024-01-25 NOTE — Progress Notes (Unsigned)
 I,[name], CMA,acting as a neurosurgeon for [name], MD.,have documented all relevant documentation on the behalf of [name], MD,as directed by  [name], MD while in the presence of [name], MD.  Subjective:  [name] , female    DOB: 06-09-1945 , 78 y.o.   MRN: [id number]  Chief Complaint  Patient presents with   Follow-up    Patient presents today for ED follow up. She visited [facility] on 11/24. For nausea, vomiting. She reports feeling better today. She would like to do STD testing. She recently ended a relationship, she just wants to be safe & check. She denies experiencing any symptoms.  She reports recent fall on Saturday. She did injury left arm. [facility] this [facility]. She has follow up appointment on 12/12.    HPI Discussed the use of AI scribe software for clinical note transcription with the patient, who gave verbal consent to proceed.  History of Present Illness Teresa Golden is a 78 year old female who presents for follow-up after an ER visit for dizziness and nausea.  She experienced dizziness and nausea, which led her to call EMS. Upon EMS arrival, her blood pressure was elevated but stabilized by the time she reached the ER. She received treatment with fluids and Zofran  and was discharged home. Since leaving the ER, she has not had further episodes of vomiting.  Recently, she fell while putting on a caftan over her nightgown, losing her balance when the doorbell rang. This resulted in a hairline fracture of her left wrist. She visited an orthopedic clinic for evaluation, and her hand was swollen and sore the day after the fall. She has been managing the pain with Tylenol . Her last bone density scan was in December of last year.    Past Medical History:  Diagnosis Date   Arthritis    Chronic kidney disease    self reports ckd stage 3    Depression    Diabetes mellitus, type II, insulin  dependent  (HCC)    With neurologic complications. 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    Hypertensive nephropathy 07/05/2018   Hypothyroidism    Hypothyroidism 02/06/2018   Morbid obesity with BMI of 50.0-59.9, adult (HCC)    Nausea and vomiting 01/25/2024   Scoliosis      Family History  Problem Relation Age of Onset   Heart attack Mother    Heart failure Mother    Stroke Father    Breast cancer Neg Hx    BRCA 1/2 Neg Hx      Current Outpatient Medications:    allopurinol  (ZYLOPRIM ) 100 MG tablet, Take 1 tablet by mouth once daily, Disp: 270 tablet, Rfl: 0   amLODipine  (NORVASC ) 5 MG tablet, Take 1 tablet by mouth once daily, Disp: 90 tablet, Rfl: 2   augmented betamethasone  dipropionate (DIPROLENE -AF) 0.05 % cream, APPLY CREAM TOPICALLY TO AFFECTED AREA TWICE DAILY, Disp: , Rfl:    AZO-CRANBERRY PO, Take 1 tablet by mouth daily., Disp: , Rfl:    benzonatate  (TESSALON  PERLES) 100 MG capsule, Take 1 capsule (100 mg total) by mouth 3 (three) times daily as needed., Disp: 30  capsule, Rfl: 0   cetirizine  (ZYRTEC ) 10 MG tablet, Take 1 tablet (10 mg total) by mouth daily., Disp: 30 tablet, Rfl: 1   Cholecalciferol 5000 units TABS, Take 5,000 Units by mouth daily., Disp: , Rfl:    diclofenac  Sodium (VOLTAREN ) 1 % GEL, APPLY 2 GRAMS TO AFFECTED AREA(S) 4 TIMES DAILY AS NEEDED, Disp: 200 g, Rfl: 2   empagliflozin  (JARDIANCE ) 25 MG TABS tablet, Take 1 tablet (25 mg total) by mouth daily before breakfast., Disp: , Rfl:    fluconazole  (DIFLUCAN ) 150 MG tablet, Take one tablet by mouth once daily  today and repeat in 2 days, Disp: 2 tablet, Rfl: 0   gabapentin  (NEURONTIN ) 300 MG capsule, Take 2 capsules (600 mg total) by mouth at bedtime., Disp: 180 capsule, Rfl:  0   glucose blood (ONETOUCH VERIO) test strip, USE TO CHECK BLOOD SUGAR   TWO TIMES A DAY AS         INSTRUCTED, Disp: 200 strip, Rfl: 3   Insulin  Degludec (TRESIBA ) 100 UNIT/ML SOLN, Inject 10 Units/oz/day into the skin at bedtime. Patient is taking at bedtime, Disp: , Rfl:    Lancets (ONETOUCH DELICA PLUS LANCET33G) MISC, USE TO CHECK BLOOD SUGARS  TWO TIMES A DAY AS         INSTRUCTED, Disp: 200 each, Rfl: 3   levothyroxine  (SYNTHROID ) 25 MCG tablet, Take 1/2 (one-half) tablet by mouth once daily, Disp: 30 tablet, Rfl: 0   losartan  (COZAAR ) 100 MG tablet, TAKE 1 TABLET DAILY, Disp: 90 tablet, Rfl: 2   metoprolol  succinate (TOPROL -XL) 25 MG 24 hr tablet, TAKE 1 TABLET AT BEDTIME   (DISCONTINUE COREG ), Disp: 90 tablet, Rfl: 1   Multiple Vitamins-Minerals (MULTIVITAMIN WITH MINERALS) tablet, Take 1 tablet by mouth daily. Women's 50+, Disp: , Rfl:    pantoprazole  (PROTONIX ) 40 MG tablet, Take 40 mg by mouth daily as needed., Disp: , Rfl:    Semaglutide , 1 MG/DOSE, (OZEMPIC , 1 MG/DOSE,) 4 MG/3ML SOPN, Inject 1 mg into the skin once a week., Disp: 9 mL, Rfl: 1   simvastatin  (ZOCOR ) 10 MG tablet, Take 1 tablet by mouth once daily, Disp: 90 tablet, Rfl: 0   triamcinolone  ointment (KENALOG ) 0.1 %, Apply 1 Application topically 2 (two) times daily., Disp: , Rfl:    Allergies  Allergen Reactions   Meloxicam Other (See Comments)    Fever; muscle aches; flu-like symptoms Allergic, per MAR   Grass Pollen(K-O-R-T-Swt Vern) Other (See Comments)    Sinus inflammation    Other Other (See Comments)    Rose fever and hay fever     Review of Systems  Constitutional: Negative.   Respiratory: Negative.    Cardiovascular: Negative.   Gastrointestinal: Negative.   Musculoskeletal:  Positive for arthralgias.  Neurological: Negative.   Psychiatric/Behavioral: Negative.       Today's Vitals   01/25/24 1620  BP: 132/82  Pulse: 74  Temp: 98.3 F (36.8 C)  SpO2: 98%  Weight: 213 lb 12.8 oz (97 kg)   Height: 5' 4 (1.626 m)   Body mass index is 36.7 kg/m.  Wt Readings from Last 3 Encounters:  01/25/24 213 lb 12.8 oz (97 kg)  01/18/24 212 lb 15.4 oz (96.6 kg)  01/12/24 213 lb (96.6 kg)     Objective:  Physical Exam Vitals and nursing note reviewed.  Constitutional:      Appearance: Normal appearance.  HENT:     Head: Normocephalic and atraumatic.  Eyes:     Extraocular Movements: Extraocular movements  intact.  Cardiovascular:     Rate and Rhythm: Normal rate and regular rhythm.     Heart sounds: Normal heart sounds.  Pulmonary:     Effort: Pulmonary effort is normal.     Breath sounds: Normal breath sounds.  Musculoskeletal:     Cervical back: Normal range of motion.     Comments: Ambulatory with walker  Skin:    General: Skin is warm.  Neurological:     General: No focal deficit present.     Mental Status: She is alert.  Psychiatric:        Mood and Affect: Mood normal.        Behavior: Behavior normal.         Assessment And Plan:  Nausea and vomiting, unspecified vomiting type Assessment & Plan: ED notes reviewed. Nausea and vomiting on November 24th resolved post-ER treatment with fluids and Zofran . - She is encouraged to stay well hydrated.    Fall on same level from slipping, tripping or stumbling, subsequent encounter Assessment & Plan: Fall occurred while putting on a caftan, resulting in left wrist fracture. Fall occurred on 01/23/24.  - She was seen by Ortho earlier today.  - Has f/u on 12/12   Localized osteoporosis with current pathological fracture with routine healing, subsequent encounter Assessment & Plan: Osteoporosis with recent left wrist fracture. Last bone density scan in December 2024. Consideration for repeat scan within 90 days of fracture. - Referred to osteoporosis clinic for bone-strengthening medication. - Checked insurance for repeat bone density scan.  Orders: -     Amb Referral to Osteoporosis Management   Closed  fracture of left wrist, sequela Assessment & Plan: Hairline fracture confirmed by orthopedic evaluation. - Await orthopedic follow-up results.     Return if symptoms worsen or fail to improve.  Patient was given opportunity to ask questions. Patient verbalized understanding of the plan and was able to repeat key elements of the plan. All questions were answered to their satisfaction.   I, [name], MD, have reviewed all documentation for this visit. The documentation on 01/25/24 for the exam, diagnosis, procedures, and orders are all accurate and complete.   IF YOU HAVE BEEN REFERRED TO A SPECIALIST, IT MAY TAKE 1-2 WEEKS TO SCHEDULE/PROCESS THE REFERRAL. IF YOU HAVE NOT HEARD FROM US /SPECIALIST IN TWO WEEKS, PLEASE GIVE US  A CALL AT [phone].   THE PATIENT IS ENCOURAGED TO PRACTICE SOCIAL DISTANCING DUE TO THE COVID-19 PANDEMIC.

## 2024-01-26 ENCOUNTER — Ambulatory Visit: Admitting: Psychiatry

## 2024-01-26 DIAGNOSIS — N1832 Chronic kidney disease, stage 3b: Secondary | ICD-10-CM

## 2024-01-26 DIAGNOSIS — F3341 Major depressive disorder, recurrent, in partial remission: Secondary | ICD-10-CM

## 2024-01-26 DIAGNOSIS — Z630 Problems in relationship with spouse or partner: Secondary | ICD-10-CM

## 2024-01-26 DIAGNOSIS — R69 Illness, unspecified: Secondary | ICD-10-CM

## 2024-01-26 DIAGNOSIS — Z9181 History of falling: Secondary | ICD-10-CM

## 2024-01-26 NOTE — Progress Notes (Unsigned)
 Psychotherapy Progress Note [facility] Jodie Kendall, PhD LP  [name] Lexii Walsh)    MRN: [id number] Therapy format: Individual psychotherapy Date: 01/26/2024      Start: 9:18a     Stop: 10:05a     Time Spent: 47 min Location: Telehealth visit -- I connected with this patient by an approved telecommunication method (audio only), with her informed consent, and verifying identity and patient privacy.  I was located at my office and patient at her home.  As needed, we discussed the limitations, risks, and security and privacy concerns associated with telehealth service, including the availability and conditions which currently govern in-person appointments and the possibility that 3rd-party payment may not be fully guaranteed and she may be responsible for charges.  After she indicated understanding, we proceeded with the session.  Also discussed treatment planning, as needed, including ongoing verbal agreement with the plan, the opportunity to ask and answer all questions, her demonstrated understanding of instructions, and her readiness to call the office should symptoms worsen or she feels she is in a crisis state and needs more immediate and tangible assistance.   Session narrative (presenting needs, interim history, self-report of stressors and symptoms, applications of prior therapy, status changes, and interventions made in session) Changed to telehealth yesterday in light of weather, plus a fall and hairline fracture of her wrist late last week and continuing respiratory issues.  Working with a wrist brace at this point.  Had ED trip 11/24 after becoming lightheaded, then the fall was on Saturday, with urgent care.  Feels she is stable and poised to recover on her own at this point.  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  Tu/Th in person at [employer], 8a-1p time frame, with 2 classes  in person and 1 online that's flexible scheduling.  Did secure financial aid, and knows she will be able to purchase an affordable laptop with integrated MS Office and warranty service through the campus store.  Says she has done the math on costs and her fin aid works out.  Support/validation provided, and though it may still turn out that she lacks the stamina, concentration, or self-discipline to see it through, it is her prerogative to try.  Meanwhile, she has spoken with [facility] to restore clearer boundaries/expectations, effectively that she does not want to talk any more.  Feels she did a good job of getting it across, though history has been that he always insinuates himself again, just without her accepting sexual advances for months now.  Allows on discussion that she probably will allow a call later, but she'll keep it light, short, and be ready to sign off if he turns suggestive or chauvinistic in any way.  Notes she has met another man now, actually, who may be of interest to date, just not yet.  He is in fact calling daily, suggesting that her real assertiveness lies with having an alternative man now in mind.  Allowed she can conduct her own affairs, main care from a therapy standpoint that she maintains self-respect and answers reliably to the woman in the mirror.  Pleased with herself for cleaning up in her kitchen, particularly the oven, which had been getting grungy by her account.  Admits she's scared as hell of disappointing herself by not being disciplined enough with school, voice beginning to tremble.  Nervous about failing to be able to handle computer based learning.  Suggested she get some low-pressure practice by exploring [employer] (free online) when able, as a way to warm up computer and internet skills and her facility with learning.  There may be any number of familiar high school or college level subjects, with no requirement to complete.  Beyond that practice, recommended  making sure her approach to schooling is to stand ready to remind herself she's interested in the first place, it's not some empty obligation somebody else put on her, and every piece of study and homework is only because she honestly wants to.  Therapeutic modalities: Cognitive Behavioral Therapy, Solution-Oriented/Positive Psychology, and Ego-Supportive  Mental Status/Observations:  Appearance:   Not assessed     Behavior:  Appropriate  Motor:  Not assessed  Speech/Language:   Clear and Coherent  Affect:  Not assessed  Mood:  Worried, subdued according to topic  Thought process:  normal  Thought content:    WNL  Sensory/Perceptual disturbances:    WNL  Orientation:  Fully oriented  Attention:  Good    Concentration:  Fair  Memory:  WNL  Insight:    Variable  Judgment:   Good  Impulse Control:  Good   Risk Assessment: Danger to Self: No Self-injurious Behavior: No Danger to Others: No Physical Aggression / Violence: No Duty to Warn: No Access to Firearms a concern: No  Assessment of progress:  progressing  Diagnosis:   ICD-10-CM   1. Major depressive disorder, recurrent, in partial remission  F33.41     2. Multiple comorbid health conditions  R69     3. Relationship problem between partners  Z63.0     4. Type 2 diabetes mellitus with stage 3b chronic kidney disease, with long-term current use of insulin  (HCC)  E11.22    N18.32    Z79.4     5. History of recent fall with wrist fracture  Z91.81      Plan:  Socialization and relationships -- Endorse abstinence from dysfunctional relationships with men who use for sex, even if enjoyed, as they turn up empty.  Endorse limits with dysfunctional relatives as well, just encourage a standard of speaking limits, not just enacting them silently.  Endorse boundary/limits with Alfonso.  Endorse adoptive family relationships where present and strong friendships as enlarged family.  Continue involvement in church and suitable groups  as able.  Develop local friendships and bonus mother-daughter relationship at interest.  Offer to support groups such as CoDA or Al-Anon, and active elder facilities like [employer], [employer].  Endorse option to work or school part-time if truly able and interested.  Endorse choice over dating, so long as she can keep her integrity, maintain the view that she is worth working for and being honest for, and does not have to make any desperate decisions to feel loved and lovable.  Lonely does not have to mean desperate.  Endorse prospect of acquiring a roommate. Self-esteem -- Per her faith, continue to practice self-esteem grounded in God's unconditional love.  Self-affirm that whatever she might deem stupid, she has either learned, improved, or was doing the best she could figure out at the time, while trying to deal with strong and natural feelings, e.g., loneliness.  When tempted to feel like a failure for lack of family, or for acting quickly on desires, reframe self-image as a survivor who had to take on the harshest of losses and is legitimately tempted.  Acknowledge regrets when they come up, and reaffirm having learned from them.  Options to inventory wrongs a la 4th and 5th steps of AA and to write letters, or write and burn confessions, as motivated, but stay true that taking care of herself now is itself righting wrongs, retiring regrets, and enacting healthy self-esteem.  Option to imagine conversations with influential figures from her past now, benefit of seeing more life (or afterlife). General health -- Endorse any healthy activity, to include pool/gym if able and interested.  Address diabetes and renal self-care promptly and reliably.  Recommend taming light in late evening and after bed, with options to use soothing sound and orange lenses. Living situation -- Endorse researching more reliable, better suited living space, and as needed.  As needed, research tenant's rights in  the event landlady defaults on the mortgage.  Most likely no longer in the market for home ownership.  For housekeeping, try to make a practice of smaller, more frequent efforts and/or bringing in help as needed, with the mindset that it's all a gift to the lady who lives here.   Other recommendations/advice -- As may be noted above.  Continue to utilize previously learned skills ad lib. Medication compliance -- Maintain medication as prescribed and work faithfully with relevant prescriber(s) if any changes are desired or seem indicated. Crisis service -- Aware of call list and work-in appts.  Call the clinic on-call service, 988/hotline, 911, or present to [facility] or ER if any life-threatening psychiatric crisis. Followup -- Return for time as already scheduled.  Next scheduled visit with me 02/08/2024.  Next scheduled in this office 02/08/2024.  Lamar Kendall, PhD Jodie Kendall, PhD LP Clinical Psychologist, [facility] [address] [place], [place] [address] [phone]

## 2024-01-31 DIAGNOSIS — M810 Age-related osteoporosis without current pathological fracture: Secondary | ICD-10-CM | POA: Insufficient documentation

## 2024-01-31 DIAGNOSIS — M8080XA Other osteoporosis with current pathological fracture, unspecified site, initial encounter for fracture: Secondary | ICD-10-CM | POA: Insufficient documentation

## 2024-01-31 DIAGNOSIS — S62102A Fracture of unspecified carpal bone, left wrist, initial encounter for closed fracture: Secondary | ICD-10-CM | POA: Insufficient documentation

## 2024-01-31 NOTE — Assessment & Plan Note (Signed)
 ED notes reviewed. Nausea and vomiting on November 24th resolved post-ER treatment with fluids and Zofran . - She is encouraged to stay well hydrated.

## 2024-01-31 NOTE — Assessment & Plan Note (Signed)
 Osteoporosis with recent left wrist fracture. Last bone density scan in December 2024. Consideration for repeat scan within 90 days of fracture. - Referred to osteoporosis clinic for bone-strengthening medication. - Checked insurance for repeat bone density scan.

## 2024-01-31 NOTE — Patient Instructions (Signed)
Eating Plan for Osteoporosis Osteoporosis causes your bones to become weak and brittle. This puts you at greater risk for bone breaks (fractures) from small bumps or falls. Making changes to your diet and increasing your physical activity can help strengthen your bones and improve your overall health. Calcium and vitamin D are nutrients that play an important role in bone health. Vitamin D helps your body use calcium and strengthen bones. It is important to get enough calcium and vitamin D as part of your eating plan for osteoporosis. What are tips for following this plan? Reading food labels Try to get at least 1,000 milligrams (mg) of calcium each day. Look for foods that have at least 50 mg of calcium per serving. Talk with your health care provider about taking a calcium supplement if you do not get enough calcium from food. Do not have more than 2,500 mg of calcium each day. This is the upper limit for food and nutritional supplements combined. Too much calcium may cause constipation and prevent you from absorbing other important nutrients. Choose foods that contain vitamin D. Take a daily vitamin supplement that contains 800-1,000 international units (IU) of vitamin D. The amount may be different depending on your age, body weight, and where you live. Talk with your dietitian or health care provider about how much vitamin D is right for you. Avoid foods that have more than 300 mg of sodium per serving. Too much sodium can cause your body to lose calcium. Talk with your dietitian or health care provider about how much sodium you are allowed each day. Shopping Do not buy foods with added salt, including: Salted snacks. [employer]. Canned soups. Canned meats. Processed meats, such as bacon or precooked or cured meat like sausages or meat loaves. Smoked fish. Meal planning Eat balanced meals that contain protein foods, fruits and vegetables, and foods rich in calcium and vitamin D. Eat at least  5 servings of fruits and vegetables each day. Eat 5-6 oz (142-170 g) of lean meat, poultry, fish, eggs, or beans each day. Lifestyle Do not use any products that contain nicotine or tobacco, such as cigarettes, e-cigarettes, and chewing tobacco. If you need help quitting, ask your health care provider. If your health care provider recommends that you lose weight: Work with a dietitian to develop an eating plan that will help you reach your desired weight goal. Exercise for at least 30 minutes a day, 5 or more days a week, or as told by your health care provider. Work with a physical therapist to develop an exercise plan that includes flexibility, balance, and strength exercises. Do not focus only on aerobic exercise. Do not drink alcohol if: Your health care provider tells you not to drink. You are pregnant, may be pregnant, or are planning to become pregnant. If you drink alcohol: Limit how much you use to: 0-1 drink a day for women. 0-2 drinks a day for men. Be aware of how much alcohol is in your drink. In the [place] one drink equals one 12 oz bottle of beer (355 mL), one 5 oz glass of wine (148 mL), or one 1 oz glass of hard liquor (44 mL). What foods should I eat? Foods high in calcium  Yogurt. Yogurt with fruit. Milk. Evaporated skim milk. Dry milk powder. Calcium-fortified orange juice. Parmesan cheese. Part-skim ricotta cheese. Natural hard cheese. Cream cheese. Cottage cheese. Canned sardines. Canned salmon. Calcium-treated tofu. Calcium-fortified cereal bar. Calcium-fortified cereal. Calcium-fortified graham crackers. Cooked collard greens. Turnip greens. Broccoli.  Kale. Almonds. White beans. Corn tortilla. Foods high in vitamin D Cod liver oil. Fatty fish, such as tuna, mackerel, and salmon. Milk. Fortified soy milk. Fortified fruit juice. Yogurt. Margarine. Egg yolks. Foods high in protein Beef. Lamb. Pork tenderloin. Chicken breast. Tuna (canned). Fish  fillet. Tofu. Cooked soy beans. Soy patty. Beans (canned or cooked). Cottage cheese. Yogurt. Peanut butter. Pumpkin seeds. Nuts. Sunflower seeds. Hard cheese. Milk or other milk products, such as soy milk. The items listed above may not be a complete list of foods and beverages you can eat. Contact a dietitian for more options. Summary Calcium and vitamin D are nutrients that play an important role in bone health and are an important part of your eating plan for osteoporosis. Eat balanced meals that contain protein foods, fruits and vegetables, and foods rich in calcium and vitamin D. Avoid foods that have more than 300 mg of sodium per serving. Too much sodium can cause your body to lose calcium. Exercise is an important part of prevention and treatment of osteoporosis. Aim for at least 30 minutes a day, 5 days a week. This information is not intended to replace advice given to you by your health care provider. Make sure you discuss any questions you have with your health care provider. Document Revised: 07/28/2019 Document Reviewed: 07/28/2019 Elsevier Patient Education  2024 [employer].

## 2024-01-31 NOTE — Assessment & Plan Note (Signed)
 Hairline fracture confirmed by orthopedic evaluation. - Await orthopedic follow-up results.

## 2024-01-31 NOTE — Assessment & Plan Note (Addendum)
 Fall occurred while putting on a caftan, resulting in left wrist fracture. Fall occurred on 01/23/24.  - She was seen by Ortho earlier today.  - Has f/u on 12/12

## 2024-02-04 ENCOUNTER — Other Ambulatory Visit: Payer: Self-pay

## 2024-02-04 NOTE — Patient Instructions (Signed)
 Visit Information  Thank you for taking time to visit with me today. Please don't hesitate to contact me if I can be of assistance to you before our next scheduled appointment.  Your next care management appointment is by telephone on Thursday, January 8 at 11:00 AM  Please call the care guide team at [phone] if you need to cancel, schedule, or reschedule an appointment.   Please call [phone]-TALK (toll free, 24 hour hotline) if you are experiencing a Mental Health or Behavioral Health Crisis or need someone to talk to.  Clayborne Ly RN BSN CCM [place]  [facility], [facility] Nurse Care Coordinator  Direct Dial: [phone] Website: [email or web address]

## 2024-02-04 NOTE — Patient Outreach (Signed)
 Complex Care Management   Visit Note  02/04/2024  Name:  Teresa Golden MRN: [id number] DOB: [birth year]  Situation: Referral received for Complex Care Management related to Diabetes with Complications and Hypertension, Polyneuropathy, CKD, s/p COVID 19 with shortness of breath, reoccurring UTI, left wrist fracture, Osteoporosis. I obtained verbal consent from Patient.  Visit completed with Patient on the phone.  Background:   Past Medical History:  Diagnosis Date   Arthritis    Chronic kidney disease    self reports ckd stage 3    Depression    Diabetes mellitus, type II, insulin  dependent (HCC)    With neurologic complications. 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    Hypertensive nephropathy 07/05/2018   Hypothyroidism    Hypothyroidism 02/06/2018   Morbid obesity with BMI of 50.0-59.9, adult (HCC)    Nausea and vomiting 01/25/2024   Scoliosis     Assessment: Patient Reported Symptoms:  Cognitive Cognitive Status: Alert and oriented to person, place, and time, Normal speech and language skills Cognitive/Intellectual Conditions Management [RPT]: None reported or documented in medical history or problem list   Health Maintenance Behaviors: Annual physical exam, Immunizations Health Facilitated by: Rest, Healthy diet  Neurological Neurological Review of Symptoms: No symptoms reported    HEENT HEENT Symptoms Reported: No symptoms reported      Cardiovascular Cardiovascular Symptoms Reported: No symptoms reported    Respiratory Respiratory Symptoms Reported: Shortness of breath, Wheezing Other Respiratory Symptoms: intermittent wheezing and shortness of breath Respiratory Management Strategies: Adequate rest, Routine screening,  Medication therapy Respiratory Self-Management Outcome: 4 (good)  Endocrine Endocrine Symptoms Reported: No symptoms reported Is patient diabetic?: Yes Is patient checking blood sugars at home?: Yes Endocrine Self-Management Outcome: 4 (good)  Gastrointestinal Gastrointestinal Symptoms Reported: No symptoms reported      Genitourinary Genitourinary Symptoms Reported: Frequency Genitourinary Management Strategies: Fluid modification Genitourinary Self-Management Outcome: 4 (good)  Integumentary Integumentary Symptoms Reported: No symptoms reported    Musculoskeletal Musculoskelatal Symptoms Reviewed: Unsteady gait Other Musculoskeletal Symptoms: lost balance resulting in fall with wrist fracture Musculoskeletal Management Strategies: Medication therapy, Routine screening, Adequate rest Musculoskeletal Self-Management Outcome: 4 (good) Falls in the past year?: Yes Number of falls in past year: 1 or less Was there an injury with Fall?: Yes Fall Risk Category Calculator: 2 Patient Fall Risk Level: Moderate Fall Risk Patient at Risk for Falls Due to: History of fall(s), Impaired balance/gait Fall risk Follow up: Falls evaluation completed, Education provided  Psychosocial Psychosocial Symptoms Reported: No symptoms reported   Major Change/Loss/Stressor/Fears (CP): Denies Quality of Family Relationships: supportive Do you feel physically threatened by others?: No    02/04/2024    PHQ2-9 Depression Screening   Oswald Pott interest or pleasure in doing things    Feeling down, depressed, or hopeless    PHQ-2 - Total Score    Trouble falling or staying asleep, or sleeping too much    Feeling tired or having Autum Benfer energy    Poor appetite or overeating     Feeling bad about yourself - or that you are a failure or have let yourself or your family down    Trouble concentrating on things, such as reading the newspaper or watching television    Moving or  speaking so slowly that other people  could have noticed.  Or the opposite - being so fidgety or restless that you have been moving around a lot more than usual    Thoughts that you would be better off dead, or hurting yourself in some way    PHQ2-9 Total Score    If you checked off any problems, how difficult have these problems made it for you to do your work, take care of things at home, or get along with other people    Depression Interventions/Treatment      There were no vitals filed for this visit. Pain Scale: Not given for pain  Medications Reviewed Today     Reviewed by Morgan Clayborne CROME, RN (Registered Nurse) on 02/04/24 at 1042  Med List Status: <None>   Medication Order Taking? Sig Documenting Provider Last Dose Status Informant  allopurinol  (ZYLOPRIM ) 100 MG tablet [id number]  Take 1 tablet by mouth once daily Petrina Pries, NP  Active   amLODipine  (NORVASC ) 5 MG tablet [id number]  Take 1 tablet by mouth once daily Jarold Medici, MD  Active   augmented betamethasone  dipropionate (DIPROLENE -AF) 0.05 % cream [id number]  APPLY CREAM TOPICALLY TO AFFECTED AREA TWICE DAILY [provider]  Active   AZO-CRANBERRY PO [id number]  Take 1 tablet by mouth daily. [provider]  Active Self  benzonatate  (TESSALON  PERLES) 100 MG capsule [id number]  Take 1 capsule (100 mg total) by mouth 3 (three) times daily as needed. Petrina Pries, NP  Active   cetirizine  (ZYRTEC ) 10 MG tablet [id number]  Take 1 tablet (10 mg total) by mouth daily. Petrina Pries, NP  Active   Cholecalciferol 5000 units TABS [id number]  Take 5,000 Units by mouth daily. [provider]  Active Nursing Home Medication Administration Guide (MAG)           Med Note ALLEGRA, [place] I   Fri Aug 07, 2017  6:51 PM)    diclofenac  Sodium (VOLTAREN ) 1 % GEL [id number]  APPLY 2 GRAMS TO AFFECTED AREA(S) 4 TIMES DAILY AS NEEDED Jarold Medici, MD  Active   empagliflozin  (JARDIANCE ) 25 MG TABS tablet [id number]  Take 1 tablet (25 mg total) by mouth daily  before breakfast. Jarold Medici, MD  Active   fluconazole  (DIFLUCAN ) 150 MG tablet [id number]  Take one tablet by mouth once daily  today and repeat in 2 days Jarold Medici, MD  Active   gabapentin  (NEURONTIN ) 300 MG capsule [id number]  Take 2 capsules (600 mg total) by mouth at bedtime. Tobie Franky SQUIBB, DPM  Active   glucose blood [facility] VERIO) test strip [id number]  USE TO CHECK BLOOD SUGAR   TWO TIMES A DAY AS         INSTRUCTED Jarold Medici, MD  Active   Insulin  Degludec (TRESIBA ) 100 UNIT/ML SOLN [id number]  Inject 10 Units/oz/day into the skin at bedtime. Patient is taking at bedtime [provider]  Active   Lancets [facility] [place]) MISC [id number]  USE TO CHECK BLOOD SUGARS  TWO TIMES A DAY AS         INSTRUCTED Jarold Medici, MD  Active   levothyroxine  (SYNTHROID ) 25 MCG tablet [id number]  Take 1/2 (one-half) tablet by mouth once daily Jarold Medici, MD  Active   losartan  (COZAAR ) 100 MG tablet [id number]  TAKE 1 TABLET DAILY Jarold Medici, MD  Active   metoprolol  succinate (TOPROL -XL) 25 MG 24 hr tablet [id number]  TAKE 1 TABLET AT BEDTIME   (DISCONTINUE COREG ) Jarold,  Catheryn, MD  Active   Multiple Vitamins-Minerals (MULTIVITAMIN WITH MINERALS) tablet [id number]  Take 1 tablet by mouth daily. Women's 50+ [provider]  Active Nursing Home Medication Administration Guide (MAG)           Med Note ALLEGRA, [place] I   Fri Aug 07, 2017  6:53 PM)    pantoprazole  (PROTONIX ) 40 MG tablet [id number]  Take 40 mg by mouth daily as needed. [provider]  Active   Semaglutide , 1 MG/DOSE, (OZEMPIC , 1 MG/DOSE,) 4 MG/3ML SOPN [id number]  Inject 1 mg into the skin once a week. Jarold Catheryn, MD  Active   Patient not taking:  Discontinued 11/06/20 1600 (Reorder) simvastatin  (ZOCOR ) 10 MG tablet [id number]  Take 1 tablet by mouth once daily Jarold Catheryn, MD  Active   triamcinolone  ointment (KENALOG ) 0.1 % [id number]  Apply 1 Application topically 2 (two) times  daily. [provider]  Active   Med List Note Marisa Nathanel SAILOR, CPhT 11/02/20 2136): Admitted to [facility] on 10/22/2020 Larue SAILOR) [place], [place]            Recommendation:   PCP Follow-up  04/13/2024 Status: Sch   Time: 2:20 PM Length: 20  Visit Type: OFFICE VISIT [8002] Copay: $0.00  Provider: Jarold Catheryn, MD Department: [facility]   Specialty provider follow-up  02/22/2024 Status: Sch   Time: 9:30 AM Length: 45  Visit Type: NEW PATIENT SPECIALTY PRACTICE [2256] Copay: $0.00  Provider: Persons, Ronal Dragon, [place] Department: [facility]    04/06/2024 Status: Sch   Time: 10:00 AM Length: 15  Visit Type: DIABETIC FT CARE [318] Copay: $0.00  Provider: Gaynel Delon CROME, DPM Department: [facility]   Follow Up Plan:   Telephone follow up appointment date/time   03/03/2024 Status: Sch   Time: 11:00 AM Length: 30  Visit Type: [facility] TELEPHONE CALL 30 [2502] Copay: $0.00  Provider: Morgan Clayborne CROME, RN Department: [facility]   Clayborne Morgan RN BSN CCM [place]  [facility], [facility] Nurse Care Coordinator  Direct Dial: [phone] Website: [email or web address]

## 2024-02-08 ENCOUNTER — Ambulatory Visit: Admitting: Psychiatry

## 2024-02-08 DIAGNOSIS — F3341 Major depressive disorder, recurrent, in partial remission: Secondary | ICD-10-CM

## 2024-02-08 DIAGNOSIS — F1090 Alcohol use, unspecified, uncomplicated: Secondary | ICD-10-CM | POA: Diagnosis not present

## 2024-02-08 DIAGNOSIS — R69 Illness, unspecified: Secondary | ICD-10-CM | POA: Diagnosis not present

## 2024-02-08 DIAGNOSIS — Z599 Problem related to housing and economic circumstances, unspecified: Secondary | ICD-10-CM

## 2024-02-08 DIAGNOSIS — Z630 Problems in relationship with spouse or partner: Secondary | ICD-10-CM

## 2024-02-08 NOTE — Progress Notes (Unsigned)
 Psychotherapy Progress Note [facility] Jodie Kendall, PhD LP  [name] Teresa Golden)    MRN: [id number] Therapy format: Individual psychotherapy Date: 02/08/2024      Start: 9:27a     Stop: 10:14a     Time Spent: 47 min Location: Telehealth visit -- I connected with this patient by an approved telecommunication method (audio only), with her informed consent, and verifying identity and patient privacy.  I was located at my office and patient at her home.  As needed, we discussed the limitations, risks, and security and privacy concerns associated with telehealth service, including the availability and conditions which currently govern in-person appointments and the possibility that 3rd-party payment may not be fully guaranteed and she may be responsible for charges.  After she indicated understanding, we proceeded with the session.  Also discussed treatment planning, as needed, including ongoing verbal agreement with the plan, the opportunity to ask and answer all questions, her demonstrated understanding of instructions, and her readiness to call the office should symptoms worsen or she feels she is in a crisis state and needs more immediate and tangible assistance.   Session narrative (presenting needs, interim history, self-report of stressors and symptoms, applications of prior therapy, status changes, and interventions made in session) First attempt at phone call 9:14a, reached voicemail.  Pt returned call 9:27a, apologetic   Been busy making apple pie.  Planning to go to new student orientation on campus at [employer] on Wed.  Taking 12 credits, to satisfy full time and financial aid requirements.  Doing alright overall.  Seeing more of friend Diane lately, and looking again at sharing space, which they agree would be 3 bedrooms, possibly a freestanding house.  Not till September, but looking ahead, hoping to get walk-in shower, too.  Has been consolidating things  lately, decluttering unneeded boxes, etc.  Seeing a little better energy, attributed to getting busy with life plans and decluttering.  Medically, has figured out she has asthma, is getting inhalers.  Fx'd wrist healing with brace, no need for surgery or cast.  Getting more regular with her thyroid  medication is also working out for mood.    Relationally, Exie has continued to call, not as often, and he is keeping it clean as agreed.  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  Committed to having tunnel vision about men.    Re school and self-doubt, optimistic that 2 mornings on campus will help her organize, will just need to get herself to take the study time.  Not panicking, just baseline nervous.  Well set to ask fro help if/when needed, e.g., with technology or course tutoring.  Realistic she may come out not capable of landing and holding a job, but the education itself is worth it, and the mental stimulus.    Late in session brings up her alcohol  dependency.  Has been slowing down, but realizes she does not want it to interfere with learning.  Admits daily drinking, est 1/2 pint liquor a day, can be as much as a whole pint.  Therapeutic modalities: {AM:23362::Cognitive Behavioral Therapy,Solution-Oriented/Positive Psychology}  Mental Status/Observations:  Appearance:   {PSY:22683}     Behavior:  {PSY:21022743}  Motor:  {PSY:22302}  Speech/Language:   {PSY:22685}  Affect:  {PSY:22687}  Mood:  {PSY:31886}  Thought process:  {PSY:31888}  Thought content:    {  EDB:7896499963}  Sensory/Perceptual disturbances:    {PSY:6295559184}  Orientation:  {Psych Orientation:23301::Fully oriented}  Attention:  {Good-Fair-Poor ratings:23770::Good}    Concentration:  {Good-Fair-Poor ratings:23770::Good}  Memory:  {PSY:512-393-3093}  Insight:    {Good-Fair-Poor  ratings:23770::Good}  Judgment:   {Good-Fair-Poor ratings:23770::Good}  Impulse Control:  {Good-Fair-Poor ratings:23770::Good}   Risk Assessment: Danger to Self: {Risk:22599::No} Self-injurious Behavior: {Risk:22599::No} Danger to Others: {Risk:22599::No} Physical Aggression / Violence: {Risk:22599::No} Duty to Warn: {AMYesNo:22526::No} Access to Firearms a concern: {AMYesNo:22526::No}  Assessment of progress:  {Progress:22147::progressing}  Diagnosis:   ICD-10-CM   1. Major depressive disorder, recurrent, in partial remission  F33.41     2. Multiple comorbid health conditions  R69     3. Housing or economic circumstances  Z59.9     4. Relationship problem between partners  Z63.0      Plan:  Socialization and relationships -- Endorse abstinence from dysfunctional relationships with men who use for sex, even if enjoyed, as they turn up empty.  Endorse limits with dysfunctional relatives as well, just encourage a standard of speaking limits, not just enacting them silently.  Endorse boundary/limits with Alfonso.  Endorse adoptive family relationships where present and strong friendships as enlarged family.  Continue involvement in church and suitable groups as able.  Develop local friendships and bonus mother-daughter relationship at interest.  Offer to support groups such as CoDA or Al-Anon, and active elder facilities like [employer], [employer].  Endorse option to work or school part-time if truly able and interested.  Endorse choice over dating, so long as she can keep her integrity, maintain the view that she is worth working for and being honest for, and does not have to make any desperate decisions to feel loved and lovable.  Lonely does not have to mean desperate.  Endorse prospect of acquiring a roommate. Self-esteem -- Per her faith, continue to practice self-esteem grounded in God's unconditional love.  Self-affirm that whatever she might deem  stupid, she has either learned, improved, or was doing the best she could figure out at the time, while trying to deal with strong and natural feelings, e.g., loneliness.  When tempted to feel like a failure for lack of family, or for acting quickly on desires, reframe self-image as a survivor who had to take on the harshest of losses and is legitimately tempted.  Acknowledge regrets when they come up, and reaffirm having learned from them.  Options to inventory wrongs a la 4th and 5th steps of AA and to write letters, or write and burn confessions, as motivated, but stay true that taking care of herself now is itself righting wrongs, retiring regrets, and enacting healthy self-esteem.  Option to imagine conversations with influential figures from her past now, benefit of seeing more life (or afterlife). General health -- Endorse any healthy activity, to include pool/gym if able and interested.  Address diabetes and renal self-care promptly and reliably.  Recommend taming light in late evening and after bed, with options to use soothing sound and orange lenses. Living situation -- Endorse researching more reliable, better suited living space, and as needed.  As needed, research tenant's rights in the event landlady defaults on the mortgage.  Most likely no longer in the market for home ownership.  For housekeeping, try to make a practice of smaller, more frequent efforts and/or bringing in help as needed, with the mindset that it's all a gift to the lady who lives here.   Other recommendations/advice -- As may be noted above.  Continue  to utilize previously learned skills ad lib. Medication compliance -- Maintain medication as prescribed and work faithfully with relevant prescriber(s) if any changes are desired or seem indicated. Crisis service -- Aware of call list and work-in appts.  Call the clinic on-call service, 988/hotline, 911, or present to [facility] or ER if any life-threatening psychiatric  crisis. Followup -- Return for time as already scheduled, may reschedule.  Next scheduled visit with me 02/24/2024.  Next scheduled in this office 02/24/2024.  Lamar Kendall, PhD Jodie Kendall, PhD LP Clinical Psychologist, [facility] [address], [address] [place], [place] [address] [phone]

## 2024-02-11 ENCOUNTER — Other Ambulatory Visit (HOSPITAL_BASED_OUTPATIENT_CLINIC_OR_DEPARTMENT_OTHER): Payer: Self-pay

## 2024-02-12 ENCOUNTER — Other Ambulatory Visit (HOSPITAL_COMMUNITY): Payer: Self-pay

## 2024-02-12 ENCOUNTER — Other Ambulatory Visit: Payer: Self-pay

## 2024-02-12 MED ORDER — PROMETHAZINE-DM 6.25-15 MG/5ML PO SYRP: 2.5000 mL | ORAL_SOLUTION | Freq: Three times a day (TID) | ORAL | 0 refills | Status: AC | PRN

## 2024-02-12 MED ORDER — NIRMATRELVIR&RITONAVIR 150/100 10 X 150 MG & 10 X 100MG PO TBPK: ORAL_TABLET | ORAL | 0 refills | Status: AC

## 2024-02-12 MED ORDER — PANTOPRAZOLE SODIUM 40 MG PO TBEC
40.0000 mg | DELAYED_RELEASE_TABLET | Freq: Every day | ORAL | 3 refills | Status: AC
  Filled 2024-02-12 – 2024-03-28 (×2): qty 90, 90d supply, fill #0

## 2024-02-12 MED FILL — Levothyroxine Sodium Tab 25 MCG: ORAL | 30 days supply | Qty: 15 | Fill #0 | Status: AC

## 2024-02-16 ENCOUNTER — Other Ambulatory Visit: Payer: Self-pay

## 2024-02-18 ENCOUNTER — Other Ambulatory Visit: Payer: Self-pay | Admitting: Internal Medicine

## 2024-02-22 ENCOUNTER — Other Ambulatory Visit (HOSPITAL_COMMUNITY): Payer: Self-pay

## 2024-02-22 ENCOUNTER — Ambulatory Visit: Admitting: Physician Assistant

## 2024-02-22 ENCOUNTER — Encounter: Payer: Self-pay | Admitting: Physician Assistant

## 2024-02-22 ENCOUNTER — Other Ambulatory Visit: Payer: Self-pay

## 2024-02-22 VITALS — Ht 64.25 in | Wt 213.6 lb

## 2024-02-22 DIAGNOSIS — M810 Age-related osteoporosis without current pathological fracture: Secondary | ICD-10-CM | POA: Diagnosis not present

## 2024-02-22 MED ORDER — DENOSUMAB-BBDZ 60 MG/ML ~~LOC~~ SOSY: 60.0000 mg | PREFILLED_SYRINGE | Freq: Once | SUBCUTANEOUS | Status: AC

## 2024-02-22 NOTE — Progress Notes (Signed)
 "  Office Visit Note   Patient: Teresa Golden           Date of Birth: 1945/07/18           MRN: [id number] Visit Date: 02/22/2024              Requested by: Jarold Medici, MD [address] [place],  [place] [address] PCP: Jarold Medici, MD   Assessment & Plan: Visit Diagnoses:  1. Age-related osteoporosis without current pathological fracture     Plan: Patient is a pleasant 78 year old woman comes in today to discuss osteoporosis.  She does have a history of bilateral wrist fractures 1 in 2012 and 1 more recently.  She has never been treated for osteoporosis in the past.  Her last bone density showed a -3.1 at her wrist and -1.6 at her hip.  She does have a history of uterine cancer.  She also has a history of stage III kidney disease.  She has no history of ulcers but had gastric bypass sleeve in 2018.  She also has a history of severe reflux.  Does not have a history of epilepsy or seizures.  She went through menopause at age 51 she does take calcium she thinks in her multivitamin but is unsure of how much.  She takes 5000 international units of vitamin D daily she has never done hormone replacement therapy.  She is a current smoker smoking up pack of cigarettes per month she drinks 12 alcoholic beverages a day.  She does do water  aerobics.  She has noticed that she has had decrease in her height.  She has had no issues with dental dentition.  She does have a history of a hip fracture in her family specifically in her mother she has a FRAX score of 19% chance of fracture overall in the next 10 years and concerning is that she does have a 13% increase risk of hip fracture in the next 10 years.  I spent 45 minutes reviewing her chart and speaking with her and answering her questions.  Medications like Fosamax would not be appropriate because of her kidney issues as well as her history of severe reflux.  I do think she would do well with Prolia  or Jubbonti .  She would like to go  forward with this I discussed major side effects.  We also discussed tracking her vitamin D and calcium.  We will call her when insurance approval has occurred  Follow-Up Instructions: No follow-ups on file.   Orders:  No orders of the defined types were placed in this encounter.  No orders of the defined types were placed in this encounter.     Procedures: No procedures performed   Clinical Data: No additional findings.   Subjective: No chief complaint on file.   HPI patient is a pleasant 78 year old woman referred for evaluation of osteoporosis by Dr. Jarold  Review of Systems  All other systems reviewed and are negative.    Objective: Vital Signs: There were no vitals taken for this visit.  Physical Exam Constitutional:      Appearance: Normal appearance.  Pulmonary:     Effort: Pulmonary effort is normal.  Skin:    General: Skin is warm and dry.  Neurological:     General: No focal deficit present.     Mental Status: She is alert and oriented to person, place, and time.  Psychiatric:        Mood and Affect: Mood normal.  Specialty Comments:  No specialty comments available.  Imaging: No results found.   PMFS History: Patient Active Problem List   Diagnosis Date Noted   Age-related osteoporosis without current pathological fracture 01/31/2024   Closed fracture of left wrist 01/31/2024   Nausea and vomiting 01/25/2024   Obesity, morbid (HCC) 01/12/2024   Post-COVID chronic cough 12/11/2023   Liver mass 10/17/2023   Encounter for general adult medical examination w/o abnormal findings 09/01/2023   Bunion of left foot 06/16/2023   Urinary tract infection without hematuria 06/16/2023   MGUS (monoclonal gammopathy of unknown significance) 06/16/2023   Contusion of right knee 06/16/2023   Fall on same level from slipping, tripping, or stumbling 06/16/2023   Tension-type headache, not intractable 04/08/2023   Pain due to onychomycosis of  toenails of both feet 03/20/2023   Hallux valgus, acquired 03/20/2023   GERD with apnea 01/01/2023   Gasping for breath 01/01/2023   Snoring 12/02/2022   Abnormal laboratory test 12/02/2022   Hair loss 10/18/2022   Left ear impacted cerumen 06/30/2022   Estrogen deficiency 06/30/2022   Diarrhea 05/14/2022   Glomerular disorders in diseases classified elsewhere 05/13/2022   Inflammatory and toxic neuropathy 05/13/2022   Polyneuropathy due to type 2 diabetes mellitus (HCC) 05/13/2022   Arthritis of hand 04/07/2022   Arthritis of right wrist 04/07/2022   Carpal tunnel syndrome of right wrist 03/05/2022   Acute idiopathic gout of right ankle 10/20/2021   Primary insomnia 10/20/2021   Class 1 obesity due to excess calories with serious comorbidity and body mass index (BMI) of 34.0 to 34.9 in adult 10/20/2021   Infected wound 11/02/2020   Pressure injury of skin 10/22/2020   Wound infection after surgery 10/12/2020   Normocytic anemia 10/12/2020   Obesity (BMI 30-39.9) 10/12/2020   Sepsis (HCC) 10/12/2020   Personal history of COVID-19 06/21/2020   Hypertension 06/18/2020   Type 2 diabetes mellitus with stage 3 chronic kidney disease, with long-term current use of insulin  (HCC) 11/29/2019   Trochanteric bursitis of right hip 11/18/2019   Panniculitis 09/30/2019   Back pain 09/30/2019   Muscle weakness 03/17/2019   Cirrhosis of liver without ascites (HCC) 07/05/2018   Hypertensive nephropathy 07/05/2018   Stage 3a chronic kidney disease (HCC) 07/05/2018   Chronic left shoulder pain 07/05/2018   Primary hypothyroidism 02/06/2018   Hepatitis C virus infection cured after antiviral drug therapy 02/04/2018   Osteoarthritis of right knee 07/30/2017   Bilateral knee pain 04/29/2017   Cervical radiculopathy 04/28/2017   Degeneration of lumbar intervertebral disc 03/13/2017   Degenerative scoliosis 03/13/2017   Degenerative spondylolisthesis 03/13/2017   Flatback syndrome 03/13/2017    S/P laparoscopic sleeve gastrectomy July 2018 08/26/2016   Preop cardiovascular exam 07/30/2016   Dyspnea on exertion 07/30/2016   Bilateral lower extremity edema 07/30/2016   Edema of lower extremity 07/30/2016   Past Medical History:  Diagnosis Date   Arthritis    Chronic kidney disease    self reports ckd stage 3    Depression    Diabetes mellitus, type II, insulin  dependent (HCC)    With neurologic complications. 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    Hypertensive nephropathy 07/05/2018   Hypothyroidism    Hypothyroidism  02/06/2018   Morbid obesity with BMI of 50.0-59.9, adult (HCC)    Nausea and vomiting 01/25/2024   Scoliosis     Family History  Problem Relation Age of Onset   Heart attack Mother    Heart failure Mother    Stroke Father    Breast cancer Neg Hx    BRCA 1/2 Neg Hx     Past Surgical History:  Procedure Laterality Date   ABDOMINAL HYSTERECTOMY     uterus   CHOLECYSTECTOMY     DIAGNOSTIC LAPAROSCOPY     DILATION AND CURETTAGE OF UTERUS     EYE SURGERY     cataract extraction bilateral    INCISION AND DRAINAGE OF WOUND N/A 10/15/2020   Procedure: IRRIGATION AND DEBRIDEMENT WOUND;  Surgeon: Lowery Estefana RAMAN, DO;  Location: [facility] OR;  Service: Plastics;  Laterality: N/A;  60 min   JOINT REPLACEMENT     Left knee   KNEE ARTHROPLASTY Right 07/30/2017   Procedure: RIGHT TOTAL KNEE ARTHROPLASTY WITH COMPUTER NAVIGATION;  Surgeon: Fidel Rogue, MD;  Location: [facility] ORS;  Service: Orthopedics;  Laterality: Right;  Needs RNFA   LAPAROSCOPIC GASTRIC BANDING     and reversal   LAPAROSCOPIC GASTRIC SLEEVE RESECTION N/A 08/26/2016   Procedure: LAPAROSCOPIC GASTRIC SLEEVE RESECTION WITH UPPER ENOD;  Surgeon: Gladis Cough, MD;  Location: [facility] ORS;  Service: General;  Laterality: N/A;   PANNICULECTOMY N/A 09/26/2020   Procedure: PANNICULECTOMY;  Surgeon: Lowery Estefana RAMAN, DO;  Location: [facility] OR;  Service: Plastics;  Laterality: N/A;   TONSILLECTOMY     TRANSTHORACIC ECHOCARDIOGRAM  11/2013   EF 65-70%. Normal diastolic Fxn.  Normal Valves.   UMBILICAL HERNIA REPAIR N/A 09/26/2020   Procedure: HERNIA REPAIR UMBILICAL ADULT;  Surgeon: Sebastian Moles, MD;  Location[facility] OR;  Service: General;  Laterality: N/A;   Social History   Occupational History   Occupation: retired  Tobacco Use   Smoking status: Former    Current packs/day: 0.25    Average packs/day: 0.3 packs/day for 10.0 years (2.5 ttl pk-yrs)    Types: Cigarettes   Smokeless tobacco: Never   Tobacco comments:    quit 20 years ago  Vaping Use   Vaping status: Never Used  Substance and Sexual Activity   Alcohol  use: Yes    Comment: occasional   Drug use: No   Sexual activity: Not Currently        "

## 2024-02-24 ENCOUNTER — Ambulatory Visit: Payer: Self-pay | Admitting: Psychiatry

## 2024-02-24 DIAGNOSIS — Z630 Problems in relationship with spouse or partner: Secondary | ICD-10-CM

## 2024-02-24 DIAGNOSIS — Z599 Problem related to housing and economic circumstances, unspecified: Secondary | ICD-10-CM

## 2024-02-24 DIAGNOSIS — F1090 Alcohol use, unspecified, uncomplicated: Secondary | ICD-10-CM

## 2024-02-24 DIAGNOSIS — R69 Illness, unspecified: Secondary | ICD-10-CM

## 2024-02-24 DIAGNOSIS — F3341 Major depressive disorder, recurrent, in partial remission: Secondary | ICD-10-CM

## 2024-02-24 NOTE — Progress Notes (Signed)
 Psychotherapy Progress Note [facility] Jodie Kendall, PhD LP  [name] Karilyn Wind)    MRN: [id number] Therapy format: Individual psychotherapy Date: 02/24/2024      Start: 8:31a     Stop: 9:03a     Time Spent: 32 min Location: Telehealth visit -- I connected with this patient by an approved telecommunication method (audio only), with her informed consent, and verifying identity and patient privacy.  I was located at my office and patient at her home.  As needed, we discussed the limitations, risks, and security and privacy concerns associated with telehealth service, including the availability and conditions which currently govern in-person appointments and the possibility that 3rd-party payment may not be fully guaranteed and she may be responsible for charges.  After she indicated understanding, we proceeded with the session.  Also discussed treatment planning, as needed, including ongoing verbal agreement with the plan, the opportunity to ask and answer all questions, her demonstrated understanding of instructions, and her readiness to call the office should symptoms worsen or she feels she is in a crisis state and needs more immediate and tangible assistance.   Session narrative (presenting needs, interim history, self-report of stressors and symptoms, applications of prior therapy, status changes, and interventions made in session) Appt thought to be in person, discovered late it was phone, made call 8:18a, reached voicemail, LM will try again 10 min.  EHR shows previously unnoticed diagnosis of cirrhosis, medical visit this week for osteoporosis management, and a payment issue last week with pharmacy refill.  Callback reached her long enough for a greeting then went dead.  2nd callback again, reached her, having forgotten about the appointment.  Doing well.  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  Going out with Ron  tomorrow for first face to face meeting.  (Daytime, meeting at a restaurant, since his cataracts preclude night driving.)  Resolving already herself to make first face time at her home not 1:1, so she isn't susceptible to fast sex, a respect she feels she owes herself.  Interested enough to date, but clear she doesn't want to let a relationship detract from school.  Also still a bit cautious after he said online he wished he could come over and ut his head in her lap -- seemed to signal another hound dog.  School begins in person next Monday, will get her laptop then, after money and equipment becomes available Jan 5.  Classes start Jan 13.  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    Medically, is getting good results from her inhalers.  Has medical advice to up her calcium intake with low-cal orange juice and some yogurt, and will probably begin Prolia  injxs.  Probably multiple benefits to yogurt, encouraged in its use.  Asks about THC gummies, apparently available through the mail.  Alyse Lye uses 1/4s to help sleep and to treat rheumatoid arthritis.  Advised it's complicated, between evolving laws and drug tests, and other options endorsed the idea of melatonin, low dose, 1 hr ahead of bed.    Discussed outlook for therapy, figures she may move to afternoons, but Monday morning as scheduled will still work.  Intends to be in person Jan-Feb, aware she would need to inform office if changes.  Did not have opportunity to ask after her alcohol  intentions, but she sounds clearer headed, and casual mention of having a drink  or two with her friend for New Year's seems temperate enough, as she professes clear intentions for health and wellness in other ways.  Will return to the subject next visit to help ensure adequate management of lifestyle and health risks.  Therapeutic modalities: Cognitive Behavioral Therapy,  Solution-Oriented/Positive Psychology, and Ego-Supportive  Mental Status/Observations:  Appearance:   Not assessed     Behavior:  Appropriate  Motor:  Not assessed  Speech/Language:   Clear and Coherent  Affect:  Not assessed  Mood:  normal  Thought process:  normal  Thought content:    WNL  Sensory/Perceptual disturbances:    WNL  Orientation:  Fully oriented  Attention:  Good    Concentration:  Good  Memory:  WNL  Insight:    Good  Judgment:   Good  Impulse Control:  Variable   Risk Assessment: Danger to Self: No Self-injurious Behavior: No Danger to Others: No Physical Aggression / Violence: No Duty to Warn: No Access to Firearms a concern: No  Assessment of progress:  progressing  Diagnosis:   ICD-10-CM   1. Major depressive disorder, recurrent, in partial remission  F33.41     2. Problem drinking  F10.90    stabilized    3. Multiple comorbid health conditions  R69     4. Housing or economic circumstances  Z59.9     5. Relationship problem between partners  Z63.0      Plan:  Self-esteem -- Per her faith, continue to practice self-esteem grounded in God's unconditional love.  Self-affirm that whatever she might deem stupid, she has either learned, improved, or was doing the best she could figure out at the time, while trying to deal with strong and natural feelings, e.g., loneliness.  When tempted to feel like a failure for lack of family, or for acting quickly on desires, reframe self-image as a survivor who had to take on the harshest of losses and is legitimately tempted.  Acknowledge regrets when they come up, and reaffirm having learned from them.  Options to inventory wrongs a la 4th and 5th steps of AA and to write letters, or write and burn confessions, as motivated, but stay true that taking care of herself now is itself righting wrongs, retiring regrets, and enacting healthy self-esteem.  Option to imagine conversations with influential figures from her past  now, benefit of seeing more life (or afterlife).  Endorse pursuing education as a form of self-affirmation and cognitive stimulation, regardless of whether it puts her back in the work force. General health -- Endorse any healthy activity, to include pool/gym if able and interested.  Address diabetes and renal self-care promptly and reliably.  Recommend taming light in late evening and after bed, with options to use soothing sound, orange lenses, and low dose melatonin.  Advise against THC products but possible that CBD could be helpful. Alcohol  -- If able, may detox herself gradually through programmed substitution and delay of alcohol .  Refer if desired for outpatient detox protocol.  AA could be recommended for program support.  Do recommend temperance, at least, to better treat identified health concerns with liver and diabetes. Living situation -- Endorse researching more reliable, better suited living space, and as needed.  As needed, research tenant's rights in the event landlady defaults on the mortgage.  Most likely no longer in the market for home ownership.  For housekeeping, try to make a practice of smaller, more frequent efforts and/or bringing in help as needed, with the mindset that it's  all a gift to the lady who lives here.   Other recommendations/advice -- As may be noted above.  Continue to utilize previously learned skills ad lib. Medication compliance -- Maintain medication as prescribed and work faithfully with relevant prescriber(s) if any changes are desired or seem indicated. Crisis service -- Aware of call list and work-in appts.  Call the clinic on-call service, 988/hotline, 911, or present to [facility] or ER if any life-threatening psychiatric crisis. Followup -- Return for time as already scheduled.  Next scheduled visit with me 03/14/2024.  Next scheduled in this office 03/14/2024.  Lamar Kendall, PhD Jodie Kendall, PhD LP Clinical Psychologist, [facility]  Group [facility] [address] [place], [place] [address] [phone]

## 2024-03-03 ENCOUNTER — Other Ambulatory Visit: Payer: Self-pay

## 2024-03-03 ENCOUNTER — Telehealth: Payer: Self-pay | Admitting: Physician Assistant

## 2024-03-03 NOTE — Telephone Encounter (Signed)
 Patient checking the status of the prolia  injection

## 2024-03-03 NOTE — Patient Outreach (Signed)
 Complex Care Management   Visit Note  03/03/2024  Name:  Teresa Golden MRN: [id number] DOB: Apr 29, 1945  Situation: Referral received for Complex Care Management related to Diabetes with Complications and Hypertension, Polyneuropathy, CKD, s/p COVID 19 with shortness of breath, reoccurring UTI, left wrist fracture, Osteoporosis.  I obtained verbal consent from Patient.  Visit completed with Patient on the phone.   Background:   Past Medical History:  Diagnosis Date   Arthritis    Chronic kidney disease    self reports ckd stage 3    Depression    Diabetes mellitus, type II, insulin  dependent (HCC)    With neurologic complications. 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    Hypertensive nephropathy 07/05/2018   Hypothyroidism    Hypothyroidism 02/06/2018   Morbid obesity with BMI of 50.0-59.9, adult (HCC)    Nausea and vomiting 01/25/2024   Scoliosis     Assessment: Patient Reported Symptoms:  Cognitive Cognitive Status: Alert and oriented to person, place, and time, Normal speech and language skills Cognitive/Intellectual Conditions Management [RPT]: None reported or documented in medical history or problem list   Health Maintenance Behaviors: Annual physical exam, Healthy diet, Spiritual practice(s), Social activities, Stress management Health Facilitated by: Healthy diet, Stress management, Rest, Prayer/meditation  Neurological Neurological Review of Symptoms: No symptoms reported    HEENT HEENT Symptoms Reported: No symptoms reported      Cardiovascular Cardiovascular Symptoms Reported: No symptoms reported    Respiratory Respiratory Symptoms Reported: No symptoms reported    Endocrine Endocrine Symptoms Reported: No symptoms  reported Is patient diabetic?: Yes Is patient checking blood sugars at home?: Yes List most recent blood sugar readings, include date and time of day: FBS not given today Endocrine Self-Management Outcome: 4 (good)  Gastrointestinal Gastrointestinal Symptoms Reported: No symptoms reported      Genitourinary Genitourinary Symptoms Reported: Frequency, Incontinence Additional Genitourinary Details: patient established with [facility] who started Gemtesa and will continue to monitor Genitourinary Management Strategies: Medication therapy, Incontinence garment/pad Genitourinary Self-Management Outcome: 4 (good)  Integumentary Integumentary Symptoms Reported: No symptoms reported    Musculoskeletal Musculoskelatal Symptoms Reviewed: Other Other Musculoskeletal Symptoms: Age related Osteoporosis, patient prescribed to take Denosumab -bbdz 60 mg once approved by insurance Musculoskeletal Management Strategies: Exercise, Routine screening, Medication therapy, Adequate rest Musculoskeletal Self-Management Outcome: 4 (good)      Psychosocial Psychosocial Symptoms Reported: No symptoms reported   Major Change/Loss/Stressor/Fears (CP): School or job Behaviors When Feeling Stressed/Fearful: patient feels a Ceola Para nervous about starting college next week Techniques to [employer] with Loss/Stress/Change: Counseling, Diversional activities, Medication, Support group Quality of Family Relationships: supportive Do you feel physically threatened by others?: No    03/03/2024    PHQ2-9 Depression Screening   Teresa Golden interest or pleasure in doing things    Feeling down, depressed, or hopeless    PHQ-2 - Total Score    Trouble falling or staying asleep, or sleeping too much    Feeling tired or having Kamyla Olejnik energy    Poor appetite or overeating     Feeling bad about yourself - or that you are a failure or have let yourself or your family down    Trouble concentrating on things, such as reading the  newspaper  or watching television    Moving or speaking so slowly that other people could have noticed.  Or the opposite - being so fidgety or restless that you have been moving around a lot more than usual    Thoughts that you would be better off dead, or hurting yourself in some way    PHQ2-9 Total Score    If you checked off any problems, how difficult have these problems made it for you to do your work, take care of things at home, or get along with other people    Depression Interventions/Treatment      There were no vitals filed for this visit. Pain Scale: Not given for pain  Medications Reviewed Today     Reviewed by Morgan Clayborne CROME, RN (Registered Nurse) on 03/03/24 at 1113  Med List Status: <None>   Medication Order Taking? Sig Documenting Provider Last Dose Status Informant  allopurinol  (ZYLOPRIM ) 100 MG tablet [id number]  Take 1 tablet by mouth once daily Petrina Pries, NP  Active   amLODipine  (NORVASC ) 5 MG tablet [id number]  Take 1 tablet by mouth once daily Jarold Medici, MD  Active   augmented betamethasone  dipropionate (DIPROLENE -AF) 0.05 % cream [id number]  APPLY CREAM TOPICALLY TO AFFECTED AREA TWICE DAILY [provider]  Active   AZO-CRANBERRY PO [id number]  Take 1 tablet by mouth daily. [provider]  Active Self  benzonatate  (TESSALON ) 100 MG capsule [id number]  Take 1 capsule (100 mg total) by mouth 3 (three) times daily as needed. Petrina Pries, NP  Active   cetirizine  (ZYRTEC ) 10 MG tablet [id number]  Take 1 tablet (10 mg total) by mouth daily. Petrina Pries, NP  Active   Cholecalciferol 5000 units TABS [id number]  Take 5,000 Units by mouth daily. [provider]  Active Nursing Home Medication Administration Guide (MAG)           Med Note ALLEGRA, [place] I   Fri Aug 07, 2017  6:51 PM)    denosumab -bbdz (JUBBONTI ) injection 60 mg [id number]   Persons, Ronal Dragon, [place]  Active   diclofenac  Sodium (VOLTAREN ) 1 % GEL [id number]  APPLY 2 GRAMS TO  AFFECTED AREA(S) 4 TIMES DAILY AS NEEDED Jarold Medici, MD  Active   empagliflozin  (JARDIANCE ) 25 MG TABS tablet [id number]  Take 1 tablet (25 mg total) by mouth daily before breakfast. Jarold Medici, MD  Active   fluconazole  (DIFLUCAN ) 150 MG tablet [id number]  Take one tablet by mouth once daily  today and repeat in 2 days Jarold Medici, MD  Active   gabapentin  (NEURONTIN ) 300 MG capsule [id number]  Take 2 capsules (600 mg total) by mouth at bedtime. Tobie Franky SQUIBB, DPM  Active   glucose blood [facility] VERIO) test strip [id number]  USE TO CHECK BLOOD SUGAR   TWO TIMES A DAY AS         INSTRUCTED Jarold Medici, MD  Active   Insulin  Degludec (TRESIBA ) 100 UNIT/ML SOLN [id number]  Inject 10 Units/oz/day into the skin at bedtime. Patient is taking at bedtime [provider]  Active   Lancets [facility] CATHRYNE PLUS [place]) MISC [id number]  USE TO CHECK BLOOD SUGARS  TWO TIMES A DAY AS         INSTRUCTED Jarold Medici, MD  Active   levothyroxine  (SYNTHROID ) 25 MCG tablet [id number]  Take 1/2 (one-half) tablet by mouth once daily Jarold Medici, MD  Active   losartan  (COZAAR ) 100 MG tablet [id number]  TAKE 1 TABLET DAILY Jarold Medici, MD  Active  metoprolol  succinate (TOPROL -XL) 25 MG 24 hr tablet [id number]  TAKE 1 TABLET AT BEDTIME   (DISCONTINUE COREG ) Jarold Medici, MD  Active   Multiple Vitamins-Minerals (MULTIVITAMIN WITH MINERALS) tablet [id number]  Take 1 tablet by mouth daily. Women's 50+ [provider]  Active Nursing Home Medication Administration Guide (MAG)           Med Note ALLEGRA, [place] I   Fri Aug 07, 2017  6:53 PM)    nirmatrelvir /ritonavir , renal dosing, (PAXLOVID ) 10 x 150 MG & 10 x 100MG  TBPK [id number]  Take 3 tabs by mouth 2 times daily. Christopher Savannah, PA-C  Active   pantoprazole  (PROTONIX ) 40 MG tablet [id number]  Take 40 mg by mouth daily as needed. [provider]  Active   pantoprazole  (PROTONIX ) 40 MG tablet [id number]  Take 1 tablet (40 mg total)  by mouth daily.   Active   promethazine -dextromethorphan (PROMETHAZINE -DM) 6.25-15 MG/5ML syrup [id number]  Take 2.5 mLs by mouth 3 (three) times daily as needed for cough. Christopher Savannah, PA-C  Active   Semaglutide , 1 MG/DOSE, (OZEMPIC , 1 MG/DOSE,) 4 MG/3ML SOPN [id number]  Inject 1 mg into the skin once a week. Jarold Medici, MD  Active    Patient not taking:   Discontinued 11/06/20 1600 (Reorder) simvastatin  (ZOCOR ) 10 MG tablet [id number]  Take 1 tablet by mouth once daily Jarold Medici, MD  Active   triamcinolone  ointment (KENALOG ) 0.1 % [id number]  Apply 1 Application topically 2 (two) times daily. [provider]  Active   Vibegron (GEMTESA PO) [id number] Yes Take 75 mg by mouth daily. [provider]  Active   Med List Note Marisa Nathanel SAILOR, CPhT 11/02/20 2136): Admitted to [facility] on 10/22/2020 Larue SAILOR) [place], [place]            Recommendation:   Continue Current Plan of Care  Follow Up Plan:   Closing From:  Complex Care Management  Clayborne Ly RN BSN CCM [facility]  [facility], [facility] Nurse Care Coordinator  Direct Dial: [phone] Website: [email or web address]

## 2024-03-03 NOTE — Patient Instructions (Signed)
 Visit Information  Thank you for taking time to visit with me today.    Please call 1-800-273-TALK (toll free, 24 hour hotline) if you are experiencing a Mental Health or Behavioral Health Crisis or need someone to talk to.  Clayborne Ly RN BSN CCM [place]  [facility], [facility] Nurse Care Coordinator  Direct Dial: [phone] Website: [email or web address]

## 2024-03-04 ENCOUNTER — Other Ambulatory Visit (HOSPITAL_COMMUNITY): Payer: Self-pay

## 2024-03-04 MED FILL — Levothyroxine Sodium Tab 25 MCG: ORAL | 30 days supply | Qty: 15 | Fill #0 | Status: AC

## 2024-03-08 NOTE — Telephone Encounter (Signed)
 BV requested, will f/u with patient once received.

## 2024-03-14 ENCOUNTER — Ambulatory Visit: Admitting: Psychiatry

## 2024-03-14 DIAGNOSIS — Z630 Problems in relationship with spouse or partner: Secondary | ICD-10-CM

## 2024-03-14 DIAGNOSIS — F4010 Social phobia, unspecified: Secondary | ICD-10-CM | POA: Diagnosis not present

## 2024-03-14 DIAGNOSIS — F331 Major depressive disorder, recurrent, moderate: Secondary | ICD-10-CM | POA: Diagnosis not present

## 2024-03-14 DIAGNOSIS — R69 Illness, unspecified: Secondary | ICD-10-CM
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
## 2024-03-25 ENCOUNTER — Telehealth: Payer: Self-pay | Admitting: Physician Assistant

## 2024-03-25 NOTE — Telephone Encounter (Signed)
 Patient called. She would like the status of her injections.

## 2024-03-28 ENCOUNTER — Other Ambulatory Visit (HOSPITAL_COMMUNITY): Payer: Self-pay

## 2024-03-28 ENCOUNTER — Ambulatory Visit: Admitting: Psychiatry

## 2024-03-28 ENCOUNTER — Other Ambulatory Visit: Payer: Self-pay | Admitting: Internal Medicine

## 2024-03-28 ENCOUNTER — Other Ambulatory Visit: Payer: Self-pay | Admitting: Family Medicine

## 2024-03-28 ENCOUNTER — Other Ambulatory Visit: Payer: Self-pay

## 2024-03-28 DIAGNOSIS — Z599 Problem related to housing and economic circumstances, unspecified: Secondary | ICD-10-CM

## 2024-03-28 DIAGNOSIS — R69 Illness, unspecified: Secondary | ICD-10-CM

## 2024-03-28 DIAGNOSIS — F331 Major depressive disorder, recurrent, moderate: Secondary | ICD-10-CM

## 2024-03-28 DIAGNOSIS — F4010 Social phobia, unspecified: Secondary | ICD-10-CM

## 2024-03-28 DIAGNOSIS — I129 Hypertensive chronic kidney disease with stage 1 through stage 4 chronic kidney disease, or unspecified chronic kidney disease: Secondary | ICD-10-CM

## 2024-03-28 MED ORDER — DICLOFENAC SODIUM 1 % EX GEL
2.0000 g | Freq: Four times a day (QID) | CUTANEOUS | 2 refills | Status: AC | PRN
  Filled 2024-03-28: qty 200, 20d supply, fill #0

## 2024-03-28 MED ORDER — ALLOPURINOL 100 MG PO TABS
100.0000 mg | ORAL_TABLET | Freq: Every day | ORAL | 0 refills | Status: AC
  Filled 2024-03-28: qty 90, 90d supply, fill #0

## 2024-03-28 MED ORDER — SIMVASTATIN 10 MG PO TABS
10.0000 mg | ORAL_TABLET | Freq: Every day | ORAL | 0 refills | Status: AC
  Filled 2024-03-28: qty 90, 90d supply, fill #0

## 2024-03-28 MED ORDER — AMLODIPINE BESYLATE 5 MG PO TABS
5.0000 mg | ORAL_TABLET | Freq: Every day | ORAL | 2 refills | Status: AC
  Filled 2024-03-28: qty 90, 90d supply, fill #0

## 2024-03-28 MED ORDER — LEVOTHYROXINE SODIUM 25 MCG PO TABS
12.5000 ug | ORAL_TABLET | Freq: Every day | ORAL | 0 refills | Status: AC
  Filled 2024-03-28: qty 45, 90d supply, fill #0

## 2024-03-28 NOTE — Addendum Note (Signed)
 Addended by: MARIJEAN HECK A on: 03/28/2024 05:10 PM   Modules accepted: Level of Service

## 2024-03-28 NOTE — Progress Notes (Deleted)
 No-show/Short-notice cancellation note  Patient ID: KYMBERLEE VIGER     MRN: [id number]     Date: 03/28/2024     Appt time: 10am  Work-from-home today due to winter weather, with messages to patients Friday advising of the change.  Attempts made first half of the 10:00 hour to reach pt by personal cell, could only reach voice mail twice.  Texted as a backup, with instructions to call back directly, text, or leave message on the office line about rescheduling, noting availability this afternoon or tomorrow afternoon.  As of 11am no return message or call.  No charge due to extenuating circumstances.  Followup -- Next scheduled visit with me 05/02/2024.  Next scheduled in this office 05/02/2024.  Lamar Kendall, PhD Jodie Kendall, PhD LP Clinical Psychologist, [facility] [address] [place], [place] [address] [phone]

## 2024-04-06 ENCOUNTER — Ambulatory Visit: Admitting: Podiatry

## 2024-04-11 ENCOUNTER — Ambulatory Visit: Admitting: Psychiatry

## 2024-04-13 ENCOUNTER — Ambulatory Visit: Admitting: Internal Medicine

## 2024-05-02 ENCOUNTER — Ambulatory Visit: Admitting: Psychiatry

## 2024-05-16 ENCOUNTER — Ambulatory Visit: Admitting: Psychiatry

## 2024-05-30 ENCOUNTER — Ambulatory Visit: Admitting: Psychiatry

## 2024-06-08 ENCOUNTER — Inpatient Hospital Stay: Admitting: Oncology

## 2024-06-08 ENCOUNTER — Inpatient Hospital Stay

## 2024-06-13 ENCOUNTER — Ambulatory Visit: Admitting: Psychiatry

## 2024-06-27 ENCOUNTER — Ambulatory Visit: Admitting: Psychiatry

## 2024-09-07 ENCOUNTER — Encounter: Payer: Self-pay | Admitting: Internal Medicine

## 2024-11-23 ENCOUNTER — Ambulatory Visit: Payer: Self-pay
